# Patient Record
Sex: Female | Born: 1945 | Race: White | Hispanic: No | Smoking: Former smoker
Health system: Southern US, Community
[De-identification: ages and names within clinical notes are randomized; demographics above are authoritative.]

## PROBLEM LIST (undated history)

## (undated) DIAGNOSIS — T7840XA Allergy, unspecified, initial encounter: Secondary | ICD-10-CM

## (undated) DIAGNOSIS — M549 Dorsalgia, unspecified: Secondary | ICD-10-CM

## (undated) DIAGNOSIS — K219 Gastro-esophageal reflux disease without esophagitis: Secondary | ICD-10-CM

## (undated) DIAGNOSIS — I5031 Acute diastolic (congestive) heart failure: Secondary | ICD-10-CM

## (undated) DIAGNOSIS — I4891 Unspecified atrial fibrillation: Secondary | ICD-10-CM

## (undated) DIAGNOSIS — J8410 Pulmonary fibrosis, unspecified: Secondary | ICD-10-CM

## (undated) DIAGNOSIS — R32 Unspecified urinary incontinence: Secondary | ICD-10-CM

## (undated) DIAGNOSIS — J449 Chronic obstructive pulmonary disease, unspecified: Secondary | ICD-10-CM

## (undated) DIAGNOSIS — I7782 Antineutrophilic cytoplasmic antibody (ANCA) vasculitis: Secondary | ICD-10-CM

## (undated) DIAGNOSIS — E119 Type 2 diabetes mellitus without complications: Secondary | ICD-10-CM

## (undated) DIAGNOSIS — M1990 Unspecified osteoarthritis, unspecified site: Secondary | ICD-10-CM

## (undated) DIAGNOSIS — J9621 Acute and chronic respiratory failure with hypoxia: Secondary | ICD-10-CM

## (undated) DIAGNOSIS — B029 Zoster without complications: Secondary | ICD-10-CM

## (undated) DIAGNOSIS — R0489 Hemorrhage from other sites in respiratory passages: Secondary | ICD-10-CM

## (undated) DIAGNOSIS — A419 Sepsis, unspecified organism: Secondary | ICD-10-CM

## (undated) DIAGNOSIS — R042 Hemoptysis: Secondary | ICD-10-CM

## (undated) DIAGNOSIS — R51 Headache: Secondary | ICD-10-CM

## (undated) DIAGNOSIS — E041 Nontoxic single thyroid nodule: Secondary | ICD-10-CM

## (undated) DIAGNOSIS — R6520 Severe sepsis without septic shock: Secondary | ICD-10-CM

## (undated) DIAGNOSIS — I10 Essential (primary) hypertension: Secondary | ICD-10-CM

## (undated) DIAGNOSIS — Z87440 Personal history of urinary (tract) infections: Secondary | ICD-10-CM

## (undated) DIAGNOSIS — I2721 Secondary pulmonary arterial hypertension: Secondary | ICD-10-CM

## (undated) DIAGNOSIS — G4730 Sleep apnea, unspecified: Secondary | ICD-10-CM

## (undated) DIAGNOSIS — E669 Obesity, unspecified: Secondary | ICD-10-CM

## (undated) DIAGNOSIS — C801 Malignant (primary) neoplasm, unspecified: Secondary | ICD-10-CM

## (undated) DIAGNOSIS — I517 Cardiomegaly: Secondary | ICD-10-CM

## (undated) DIAGNOSIS — I776 Arteritis, unspecified: Secondary | ICD-10-CM

## (undated) DIAGNOSIS — J45909 Unspecified asthma, uncomplicated: Secondary | ICD-10-CM

## (undated) DIAGNOSIS — I480 Paroxysmal atrial fibrillation: Secondary | ICD-10-CM

## (undated) HISTORY — DX: Dorsalgia, unspecified: M54.9

## (undated) HISTORY — DX: Acute and chronic respiratory failure with hypoxia: J96.21

## (undated) HISTORY — DX: Type 2 diabetes mellitus without complications: E11.9

## (undated) HISTORY — DX: Headache: R51

## (undated) HISTORY — DX: Sepsis, unspecified organism: R65.20

## (undated) HISTORY — DX: Unspecified osteoarthritis, unspecified site: M19.90

## (undated) HISTORY — DX: Unspecified asthma, uncomplicated: J45.909

## (undated) HISTORY — DX: Paroxysmal atrial fibrillation: I48.0

## (undated) HISTORY — DX: Cardiomegaly: I51.7

## (undated) HISTORY — DX: Secondary pulmonary arterial hypertension: I27.21

## (undated) HISTORY — DX: Hemoptysis: R04.2

## (undated) HISTORY — DX: Hemorrhage from other sites in respiratory passages: R04.89

## (undated) HISTORY — DX: Unspecified atrial fibrillation: I48.91

## (undated) HISTORY — PX: OOPHORECTOMY: SHX86

## (undated) HISTORY — DX: Sepsis, unspecified organism: A41.9

## (undated) HISTORY — DX: Obesity, unspecified: E66.9

## (undated) HISTORY — DX: Acute diastolic (congestive) heart failure: I50.31

## (undated) HISTORY — DX: Nontoxic single thyroid nodule: E04.1

## (undated) HISTORY — PX: ABLATION: SHX5711

## (undated) HISTORY — DX: Zoster without complications: B02.9

## (undated) HISTORY — DX: Antineutrophilic cytoplasmic antibody (ANCA) vasculitis: I77.82

## (undated) HISTORY — PX: CHOLECYSTECTOMY: SHX55

## (undated) HISTORY — DX: Gastro-esophageal reflux disease without esophagitis: K21.9

## (undated) HISTORY — DX: Pulmonary fibrosis, unspecified: J84.10

## (undated) HISTORY — DX: Unspecified urinary incontinence: R32

## (undated) HISTORY — DX: Arteritis, unspecified: I77.6

## (undated) HISTORY — DX: Allergy, unspecified, initial encounter: T78.40XA

## (undated) HISTORY — DX: Personal history of urinary (tract) infections: Z87.440

## (undated) HISTORY — DX: Essential (primary) hypertension: I10

## (undated) HISTORY — DX: Sleep apnea, unspecified: G47.30

---

## 1977-12-23 HISTORY — PX: ABDOMINAL HYSTERECTOMY: SHX81

## 2004-11-13 ENCOUNTER — Ambulatory Visit: Payer: Self-pay | Admitting: Internal Medicine

## 2006-01-06 ENCOUNTER — Ambulatory Visit: Payer: Self-pay | Admitting: Internal Medicine

## 2007-04-16 ENCOUNTER — Ambulatory Visit: Payer: Self-pay | Admitting: Internal Medicine

## 2007-06-16 ENCOUNTER — Ambulatory Visit: Payer: Self-pay | Admitting: Urology

## 2009-02-17 ENCOUNTER — Emergency Department: Payer: Self-pay | Admitting: Emergency Medicine

## 2009-03-13 ENCOUNTER — Ambulatory Visit: Payer: Self-pay | Admitting: Internal Medicine

## 2009-03-14 ENCOUNTER — Ambulatory Visit: Payer: Self-pay | Admitting: Unknown Physician Specialty

## 2009-03-28 ENCOUNTER — Ambulatory Visit: Payer: Self-pay | Admitting: Internal Medicine

## 2009-03-28 ENCOUNTER — Encounter: Payer: Self-pay | Admitting: Internal Medicine

## 2009-03-28 DIAGNOSIS — I43 Cardiomyopathy in diseases classified elsewhere: Secondary | ICD-10-CM

## 2009-03-28 DIAGNOSIS — I110 Hypertensive heart disease with heart failure: Secondary | ICD-10-CM | POA: Insufficient documentation

## 2009-03-28 DIAGNOSIS — I480 Paroxysmal atrial fibrillation: Secondary | ICD-10-CM | POA: Insufficient documentation

## 2009-03-28 DIAGNOSIS — I517 Cardiomegaly: Secondary | ICD-10-CM | POA: Insufficient documentation

## 2009-03-28 HISTORY — DX: Hypertensive heart disease with heart failure: I11.0

## 2009-03-28 HISTORY — DX: Cardiomegaly: I51.7

## 2009-03-30 ENCOUNTER — Ambulatory Visit: Payer: Self-pay | Admitting: Cardiology

## 2009-04-03 ENCOUNTER — Ambulatory Visit: Payer: Self-pay | Admitting: Internal Medicine

## 2009-04-10 ENCOUNTER — Ambulatory Visit: Payer: Self-pay | Admitting: Internal Medicine

## 2009-04-10 ENCOUNTER — Telehealth: Payer: Self-pay | Admitting: Adult Health

## 2009-04-11 ENCOUNTER — Ambulatory Visit: Payer: Self-pay | Admitting: Cardiology

## 2009-04-11 ENCOUNTER — Inpatient Hospital Stay (HOSPITAL_COMMUNITY): Admission: EM | Admit: 2009-04-11 | Discharge: 2009-04-20 | Payer: Self-pay | Admitting: Emergency Medicine

## 2009-04-11 ENCOUNTER — Ambulatory Visit: Payer: Self-pay | Admitting: Pulmonary Disease

## 2009-04-13 ENCOUNTER — Encounter: Payer: Self-pay | Admitting: Cardiology

## 2009-04-13 DIAGNOSIS — K219 Gastro-esophageal reflux disease without esophagitis: Secondary | ICD-10-CM

## 2009-04-13 DIAGNOSIS — E041 Nontoxic single thyroid nodule: Secondary | ICD-10-CM

## 2009-04-13 DIAGNOSIS — R519 Headache, unspecified: Secondary | ICD-10-CM | POA: Insufficient documentation

## 2009-04-13 DIAGNOSIS — M549 Dorsalgia, unspecified: Secondary | ICD-10-CM | POA: Insufficient documentation

## 2009-04-13 DIAGNOSIS — F330 Major depressive disorder, recurrent, mild: Secondary | ICD-10-CM | POA: Insufficient documentation

## 2009-04-13 DIAGNOSIS — M1990 Unspecified osteoarthritis, unspecified site: Secondary | ICD-10-CM | POA: Insufficient documentation

## 2009-04-13 DIAGNOSIS — R51 Headache: Secondary | ICD-10-CM

## 2009-04-13 DIAGNOSIS — F329 Major depressive disorder, single episode, unspecified: Secondary | ICD-10-CM

## 2009-04-13 DIAGNOSIS — I776 Arteritis, unspecified: Secondary | ICD-10-CM

## 2009-04-20 ENCOUNTER — Encounter: Payer: Self-pay | Admitting: Cardiology

## 2009-08-07 ENCOUNTER — Encounter: Payer: Self-pay | Admitting: *Deleted

## 2010-01-08 ENCOUNTER — Encounter: Payer: Self-pay | Admitting: Geriatric Medicine

## 2010-01-23 ENCOUNTER — Encounter: Payer: Self-pay | Admitting: Geriatric Medicine

## 2010-02-07 ENCOUNTER — Telehealth (INDEPENDENT_AMBULATORY_CARE_PROVIDER_SITE_OTHER): Payer: Self-pay | Admitting: *Deleted

## 2010-02-20 ENCOUNTER — Encounter: Payer: Self-pay | Admitting: Geriatric Medicine

## 2010-03-23 ENCOUNTER — Encounter: Payer: Self-pay | Admitting: Geriatric Medicine

## 2010-04-22 ENCOUNTER — Encounter: Payer: Self-pay | Admitting: Geriatric Medicine

## 2010-05-23 ENCOUNTER — Encounter: Payer: Self-pay | Admitting: Geriatric Medicine

## 2010-12-23 HISTORY — PX: OTHER SURGICAL HISTORY: SHX169

## 2011-01-22 NOTE — Progress Notes (Signed)
Summary: Records request from [facility]  Request for records received from [facility]. Request forwarded to [facility]. Valda Favia  February 07, 2010 3:44 PM

## 2011-04-03 LAB — COMPREHENSIVE METABOLIC PANEL
ALT: 46 U/L — ABNORMAL HIGH (ref 0–35)
AST: 54 U/L — ABNORMAL HIGH (ref 0–37)
Albumin: 3.1 g/dL — ABNORMAL LOW (ref 3.5–5.2)
Alkaline Phosphatase: 76 U/L (ref 39–117)
BUN: 21 mg/dL (ref 6–23)
CO2: 26 mEq/L (ref 19–32)
Calcium: 8.6 mg/dL (ref 8.4–10.5)
Chloride: 99 mEq/L (ref 96–112)
GFR calc Af Amer: >60 mL/min (ref >60)
Glucose, Bld: 198 mg/dL — ABNORMAL HIGH (ref 70–99)
Potassium: 4 mEq/L (ref 3.5–5.1)
Sodium: 136 mEq/L (ref 135–145)
Total Bilirubin: 1.2 mg/dL (ref 0.3–1.2)
Total Protein: 6.3 g/dL (ref 6.0–8.3)

## 2011-04-03 LAB — CBC
Hemoglobin: 10.2 g/dL — ABNORMAL LOW (ref 12.0–15.0)
Hemoglobin: 11.1 g/dL — ABNORMAL LOW (ref 12.0–15.0)
MCHC: 33.2 g/dL (ref 30.0–36.0)
MCHC: 33.9 g/dL (ref 30.0–36.0)
MCHC: 34.1 g/dL (ref 30.0–36.0)
MCV: 85 fL (ref 78.0–100.0)
Platelets: 292 10*3/uL (ref 150–400)
Platelets: 408 10*3/uL — ABNORMAL HIGH (ref 150–400)
RBC: 3.53 MIL/uL — ABNORMAL LOW (ref 3.87–5.11)
RBC: 3.66 MIL/uL — ABNORMAL LOW (ref 3.87–5.11)
RDW: 14.3 % (ref 11.5–15.5)
RDW: 15 % (ref 11.5–15.5)
RDW: 15 % (ref 11.5–15.5)

## 2011-04-03 LAB — PROTIME-INR
INR: 3 — ABNORMAL HIGH (ref 0.00–1.49)
INR: 3.3 — ABNORMAL HIGH (ref 0.00–1.49)
INR: 3.7 — ABNORMAL HIGH (ref 0.00–1.49)
Prothrombin Time: 22.4 seconds — ABNORMAL HIGH (ref 11.6–15.2)
Prothrombin Time: 28.5 seconds — ABNORMAL HIGH (ref 11.6–15.2)
Prothrombin Time: 33 seconds — ABNORMAL HIGH (ref 11.6–15.2)

## 2011-04-03 LAB — DIFFERENTIAL
Basophils Absolute: 0 10*3/uL (ref 0.0–0.1)
Basophils Absolute: 0 10*3/uL (ref 0.0–0.1)
Basophils Absolute: 0.1 10*3/uL (ref 0.0–0.1)
Basophils Relative: 0 % (ref 0–1)
Basophils Relative: 1 % (ref 0–1)
Eosinophils Relative: 1 % (ref 0–5)
Lymphocytes Relative: 14 % (ref 12–46)
Monocytes Absolute: 1 10*3/uL (ref 0.1–1.0)
Neutro Abs: 10.1 10*3/uL — ABNORMAL HIGH (ref 1.7–7.7)
Neutro Abs: 10.2 10*3/uL — ABNORMAL HIGH (ref 1.7–7.7)
Neutro Abs: 16 10*3/uL — ABNORMAL HIGH (ref 1.7–7.7)
Neutrophils Relative %: 89 % — ABNORMAL HIGH (ref 43–77)

## 2011-04-03 LAB — BLOOD GAS, ARTERIAL
Drawn by: 309681
O2 Content: 6 L/min
O2 Saturation: 77.3 %
Patient temperature: 98.6

## 2011-04-03 LAB — ANA
Anti Nuclear Antibody(ANA): NEGATIVE
Anti Nuclear Antibody(ANA): NEGATIVE

## 2011-04-03 LAB — BASIC METABOLIC PANEL
BUN: 16 mg/dL (ref 6–23)
BUN: 18 mg/dL (ref 6–23)
BUN: 26 mg/dL — ABNORMAL HIGH (ref 6–23)
CO2: 22 mEq/L (ref 19–32)
CO2: 29 mEq/L (ref 19–32)
Calcium: 8.2 mg/dL — ABNORMAL LOW (ref 8.4–10.5)
Calcium: 8.4 mg/dL (ref 8.4–10.5)
Calcium: 8.6 mg/dL (ref 8.4–10.5)
Chloride: 102 mEq/L (ref 96–112)
Chloride: 96 mEq/L (ref 96–112)
Creatinine, Ser: 0.68 mg/dL (ref 0.4–1.2)
Creatinine, Ser: 0.7 mg/dL (ref 0.4–1.2)
Creatinine, Ser: 0.78 mg/dL (ref 0.4–1.2)
GFR calc Af Amer: >60 mL/min (ref >60)
GFR calc Af Amer: >60 mL/min (ref >60)
GFR calc non Af Amer: >60 mL/min (ref >60)
GFR calc non Af Amer: >60 mL/min (ref >60)
GFR calc non Af Amer: >60 mL/min (ref >60)
Glucose, Bld: 120 mg/dL — ABNORMAL HIGH (ref 70–99)
Glucose, Bld: 139 mg/dL — ABNORMAL HIGH (ref 70–99)
Glucose, Bld: 162 mg/dL — ABNORMAL HIGH (ref 70–99)
Glucose, Bld: 209 mg/dL — ABNORMAL HIGH (ref 70–99)
Potassium: 4.1 mEq/L (ref 3.5–5.1)
Sodium: 135 mEq/L (ref 135–145)

## 2011-04-03 LAB — URINE MICROSCOPIC-ADD ON

## 2011-04-03 LAB — GLUCOSE, CAPILLARY
Glucose-Capillary: 109 mg/dL — ABNORMAL HIGH (ref 70–99)
Glucose-Capillary: 133 mg/dL — ABNORMAL HIGH (ref 70–99)
Glucose-Capillary: 142 mg/dL — ABNORMAL HIGH (ref 70–99)
Glucose-Capillary: 179 mg/dL — ABNORMAL HIGH (ref 70–99)
Glucose-Capillary: 210 mg/dL — ABNORMAL HIGH (ref 70–99)
Glucose-Capillary: 224 mg/dL — ABNORMAL HIGH (ref 70–99)
Glucose-Capillary: 274 mg/dL — ABNORMAL HIGH (ref 70–99)
Glucose-Capillary: 289 mg/dL — ABNORMAL HIGH (ref 70–99)
Glucose-Capillary: 329 mg/dL — ABNORMAL HIGH (ref 70–99)
Glucose-Capillary: 336 mg/dL — ABNORMAL HIGH (ref 70–99)
Glucose-Capillary: 367 mg/dL — ABNORMAL HIGH (ref 70–99)
Glucose-Capillary: 384 mg/dL — ABNORMAL HIGH (ref 70–99)

## 2011-04-03 LAB — STREP PNEUMONIAE URINARY ANTIGEN: Strep Pneumo Urinary Antigen: NEGATIVE

## 2011-04-03 LAB — URINALYSIS, ROUTINE W REFLEX MICROSCOPIC
Ketones, ur: NEGATIVE mg/dL
Leukocytes, UA: NEGATIVE
Nitrite: NEGATIVE
Specific Gravity, Urine: 1.009 (ref 1.005–1.030)
pH: 6.5 (ref 5.0–8.0)

## 2011-04-03 LAB — CK TOTAL AND CKMB (NOT AT ARMC): CK, MB: 1.6 ng/mL (ref 0.3–4.0)

## 2011-04-03 LAB — LEGIONELLA ANTIGEN, URINE

## 2011-04-03 LAB — RHEUMATOID FACTOR: Rhuematoid fact SerPl-aCnc: <20 IU/mL (ref 0–20)

## 2011-04-03 LAB — SEDIMENTATION RATE: Sed Rate: 59 mm/hr — ABNORMAL HIGH (ref 0–22)

## 2011-04-03 LAB — HIGH SENSITIVITY CRP: CRP, High Sensitivity: 5.2 mg/L — ABNORMAL HIGH

## 2011-04-03 LAB — MAGNESIUM: Magnesium: 2.2 mg/dL (ref 1.5–2.5)

## 2011-04-03 LAB — ANGIOTENSIN CONVERTING ENZYME: Angiotensin-Converting Enzyme: 37 U/L (ref 9–67)

## 2011-04-03 LAB — TROPONIN I: Troponin I: <0.01 ng/mL (ref 0.00–0.06)

## 2011-05-07 NOTE — Letter (Signed)
April 03, 2009    Thompson Grayer, MD  [address] [place], [place] [address]   RE:  [name], FIRST  MRN:  [id number]  /  DOB:  04-01-1946   Dear Jeneen Rinks,   This letter is just a cover letter to introduce to you Betty [name], a 65-  year-old woman, with paroxysmal atrial fibrillation that is very  symptomatic.  She was recently TEE cardioverted at [facility] under Dr.  Claris Gladden assistance while on propafenone.  She had failed to tolerate  flecainide from a side effect profile point of view and has actually had  a significant headaches since the Rythmol was initiated.  She is very  interested in pursuing catheter ablation for her atrial fibrillation.  We have discussed potential benefits as well as the potential risks.  Her husband underwent pulmonary vein isolation by Dr. Lenard Galloway down at  [facility]: [facility], but he was not able to offer her any options for more than 6  months.  She had seen him in the context of her husband's appointment  and I had mentioned to her that we could help her here and she just  wanted to check with him prior to pursuing that.   Her current medications include:  1. Diltiazem 120.  2. Coumadin.  3. Rythmol 325 b.i.d.   On examination today, her blood pressure was 136/80, her pulse was 76,  her weight was 250 which is down about 10 pounds.  Her neck veins were  flat.  Lungs were clear.  Heart sounds were regular, and the abdomen was  soft.  The extremities were without edema.  There was an ecchymosis on  her belly.   Electrocardiogram dated today demonstrated sinus rhythm at 76 with  intervals of 0.15/0.09/0.46.  The axis was leftward at -61.   IMPRESSION:  1. Atrial fibrillation - paroxysmal holding sinus rhythm on      propafenone.  2. Headache, question related to the medications.  3. Normal left ventricular function.   Jeneen Rinks, I look forward to your thoughts on Betty [name] and whether you think  she would be appropriate candidate for pulmonary vein isolation,  having  failed one drug and not wanting to take the others long term.   Please let me know if there is anything further I can do to assist in  this.   To facilitate her care, I have asked that she get her INR checked in  [place] next Monday when she is working.  We will check her INR  today as well.  It will be left to check when she comes in to see you in  the office and this is done to try to minimize her missing work.  Thank  you for your help.    Sincerely,      Deboraha Sprang, MD, [facility]  Electronically Signed    SCK/MedQ  DD: 04/03/2009  DT: 04/04/2009  Job #: [id number]   CC:    Frazier Richards, MD

## 2011-05-07 NOTE — Assessment & Plan Note (Signed)
[place]                                 ON-CALL NOTE   SARIT, THAKUR                           MRN:          [id number]  DATE:03/24/2009                            DOB:          1946/04/22    ELECTROPHYSIOLOGIST:  Deboraha Sprang, MD, [facility]   I received a page through the answering service from Ms. Gau stating  she was experiencing fatigue with dizziness and not sleeping at night.  I called Ms. Quint back promptly.  After a lengthy conversation, it  turned out Ms. Lasher has recently been started on flecainide and Toprol.  She is experiencing nausea when she takes the flecainide.  She is also  experiencing some visual hallucinations.  She states she is not sleeping  at night since starting the medicines.  She feels tired all the time.  Apparently is pending a sleep study this Saturday night.  She states,  however, she has only been sleeping one half to two hours sporadically  through the night since starting the medications.  She is somewhat  frustrated, does not want to carry out the sleep study as she does not  feel that it would benefit her at this time until she determines what is  causing her insomnia and possible side effects from the medicine.  I  reassured Ms. Romig that we would reschedule her sleep study as it is  causing her much stress at this time.  Reviewed the medications with  her.  She is taking the doses as prescribed.  At this time, I have  decided to hold the flecainide.  We will continue the Toprol-XL at 25 mg  in the evening.  She states when she checks her pulse it stays at around  50 and she is complaining of ongoing fatigue and daytime sleepiness.  I  reassured her that we would follow up with Dr. Caryl Comes on Monday and get  her back in the office for further evaluation.  She is agreeable to hold  the flecainide for now and continue the Toprol.  She will call the Home  company and have the sleep study put on hold, and I will  get in touch  with Dr. Olin Pia nurse to have them call her Monday for further  instructions.      Rosanne Sack, ACNP  Electronically Signed      Carlena Bjornstad, MD, [facility]  Electronically Signed   MB/MedQ  DD: 03/24/2009  DT: 03/24/2009  Job #: [id number]

## 2011-05-07 NOTE — H&P (Signed)
NAME:  Betty [name], Betty [name] NO.:  [id number]   MEDICAL RECORD NO.:  [id number]          PATIENT TYPE:  INP   LOCATION:  [id number]                         FACILITY:  [facility]   PHYSICIAN:  Minus Breeding, MD, FACCDATE OF BIRTH:  04-21-1946   DATE OF ADMISSION:  04/11/2009  DATE OF DISCHARGE:                              HISTORY & PHYSICAL   PRIMARY CARDIOLOGIST:  Deboraha Sprang, MD, [facility]   PRIMARY CARE Deyon Chizek:  Dr. Frazier Richards, [place].   PATIENT PROFILE:  A 65 year old Caucasian female with prior history of  paroxysmal atrial fibrillation who was just cardioverted yesterday who  presents with persistent dyspnea, orthopnea, and profound fatigue.   PROBLEMS:  1. Paroxysmal atrial fibrillation.      a.     Previously on flecainide, this was ineffective.      b.     Previously on propafenone, however, this was ineffective and       caused headaches.      c.     Status post TEE and cardioversion on March 30, 2009 with       successful reversion to sinus rhythm.      d.     Status post repeat cardioversion on April 10, 2009 with       restoration of sinus rhythm.  2. History of normal LV function.  3. History of normal coronary artery, chronic diastolic heart failure      worse so when she in AFib.  4. Questionable history of sleep apnea.  5. GERD.  6. Obesity.  7. History of thyroid nodules.  8. Osteoarthritis.  9. History of shingles.  10.Chronic back pain.  11.History of headaches.   HISTORY OF PRESENT ILLNESS:  A 65 year old Caucasian female with history  of paroxysmal atrial fibrillation who has failed flecainide and  propafenone therapy in the past.  She is recently status post TEE and  cardioversion on April 8th with repeat cardioversion yesterday after she  presented to our [place] office with complaints of severe dyspnea  with wheezing and tachy palpitations and was found to be in atrial  fibrillation.  She was seen by Dr. Lovena Le and referred to  [facility] where  she underwent successful cardioversion.  Following cardioversion, she  unfortunately continued to feel fatigued and exhausted as well as  short of breath with minimal activity.  She also found herself to be  orthopneic requiring 2 pillows rather than her usual 1.  She lied around  in bed all day and started amiodarone 400 mg b.i.d., which was initiated  yesterday.  During the evening last night, she noted recurrent wheezing  and she slept fairly poorly.  She said at one point, she felt her heart  beating in her throat, which she associates with atrial fibrillation.  This morning when she continued to feel short of breath and fatigue, she  thought she was probably in AFib and called our office.  She was advised  to present to the [facility] ED.  Here she is in sinus rhythm by 12-  lead, although, she remains symptomatic and had  somewhat labored speech.  Chest x-ray here has suggested pulmonary edema.   ALLERGIES:  No known drug allergies, although flecainide and propafenone  have been ineffective and she did have headaches with propafenone and a  side effect to flecainide that she does not recall.   HOME MEDICATIONS:  1. Amiodarone 400 mg b.i.d.  2. Diltiazem CD 120 mg daily.  3. Coumadin as directed.   FAMILY HISTORY:  Mother is alive at age 74 and is currently hospitalized  with dyspnea.  She apparently has no history of coronary artery disease,  stroke, or diabetes.  Father died at 7 with history of MI, CHF, and  CVA.  She has a brother who had bladder cancer, and also had history of  CAD and PVD.  She has a sister who is alive and well.   SOCIAL HISTORY:  She lives in [place] with her husband.  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  She has a 20-pack-year history of tobacco abuse, quitting in 1970.  She  denies alcohol use.  She does not use drugs.  She does not routinely  exercise.   REVIEW OF SYSTEMS:   Positive for profound fatigue as well as dyspnea on  exertion and 2-pillow orthopnea over the past 24 hours.  She just feel  very weak and washed out.  She has had a cough, which for the most part  has been nonproductive and this has been going on for the past 2 days.  Otherwise, all systems reviewed and negative.  She is a full code.   PHYSICAL EXAMINATION:  VITAL SIGNS:  Temperature 97.9, heart rate 79,  respirations 22, blood pressure 111/50, pulse ox is 90% on room air.  GENERAL:  A pleasant white female awake, alert, and oriented x3.  She  has somewhat labored speech.  She has a normal affect.  HEENT:  Normal.  NEURO:  Grossly intact.  Nonfocal.  SKIN:  Warm and dry without lesions or masses.  MUSCULOSKELETAL:  Grossly normal without deformity or effusions.  NECK:  Supple with mildly elevated JVP.  No bruits.  LUNGS:  Respirations are regular and labored with few crackles at  bilateral bases, left greater than right.  CARDIAC:  Regular S1 and S2.  No S3 or S4, murmurs.  ABDOMEN:  Round and soft.  EXTREMITIES:  No clubbing, cyanosis, or edema.  Dorsalis pedis,  posterior tibial pulses are 2+ and equal bilaterally.   Chest x-ray shows pulmonary edema.  EKG shows sinus rhythm at rate of  80, left axis, no acute SVT changes.   LABORATORY WORK:  Hemoglobin 11.1, hematocrit 33.5, WBC 13.2, and  platelets 314.   ASSESSMENT AND PLAN:  1. Dyspnea/fatigue.  Discharged yesterday morning with shortness of      breath and wheezing and she was found to be in atrial fibrillation      and subsequently underwent cardioversion.  She unfortunately has      continued to have symptoms and there has been some question of      recurrent of paroxysmal atrial fibrillation, although she is      currently in sinus rhythm, though, her symptoms persist.  Chest x-      ray has suggested pulmonary edema and we will plan to admit her and      diurese her.  I will check her BNP.  As she does exhibit       leukocytosis with recent  cough and questionable infiltrate on ECG,      if her BNP is normal, we will plan to start Avelox.  2. Acute on chronic diastolic congestive heart failure.  See #1.  We      will follow up her chest x-ray in the a.m. as well as her BNP.  We      will plan to diurese her today.  Heart rate and blood pressure      currently stable.  We will check an echo today.  3. Paroxysmal atrial fibrillation, status post cardioversion on April 10, 2009.  Amiodarone was just started yesterday at 400 mg b.i.d.      She says her INR was 4.7 yesterday.  We will continue Coumadin for      thrombus.  4. Leukocytosis.  Question etiology as above.  The chest x-ray is      abnormal per Radiology most suggestive of edema, although we      questioned whether or not she may have developed community-acquired      pneumonia.  Considering antibiotics, if BNP is low.  I will also      check UA.      Murray Hodgkins, ANP      Minus Breeding, MD, [facility]  Electronically Signed    CB/MEDQ  D:  04/11/2009  T:  04/12/2009  Job:  [id number]

## 2011-05-07 NOTE — Letter (Signed)
March 13, 2009    Dr. Frazier Richards  [place], [place]   RE:  Betty [name], Betty [name]  MRN:  [id number]  /  DOB:  January 21, 1946   Dear Betty [name],   It is a pleasure seeing Betty [name] at your request today regarding her  atrial fibrillation.   As you know she is a 65 year old woman who has a history of atrial  fibrillation that dates back about a year and half.  These episodes are  abrupt in onset and offset, they can last hours and they are associated  with significant fatigue, mild shortness of breath, discombobulating  palpitations.  They occur mostly in the afternoon, they have been  increasing in frequency.   She has been treated with diltiazem initially at 120 and then  subsequently up titrated to 180 over the last couple of months;  concurrent with this has been the development of some peripheral edema.   Her cardiac evaluation to date has included a catheterization that was  done at [facility], the results of which she does not know but she presumes it  was normal.  She had the ultrasound done in your office that  demonstrated mild LVH, moderate mitral regurgitation, this being noted  in the setting of atrial fibrillation.  She then had a stress echo which  raised the possibility of septal wall ischemia.  I presume the  catheterization was done after this.   Her thromboembolic risk factors are notable for hypertension, gender and  borderline age.  She does not have diabetes or prior stroke or heart  failure.   Her cardiac risk factors are notable as above, I do not know her lipid  status.   She has sleep disorder breathing according to what she says her husband  reports.  She has some daytime somnolence and certainly has a.m.  fatigue.  I do not know her Epworth's score.   FAMILY HISTORY:  Notable for coronary disease as well as atrial  fibrillation.   PAST MEDICAL HISTORY:  1. Thyroid nodules.  2. Arthritis.  3. GE reflux disease.  4.  Allergies.  5. History of bladder [name].  6. Shingles.  7. Back pain.  8. Headaches.   PAST SURGICAL HISTORY:  Notable for hysterectomy with unilateral  oophorectomy and cholecystectomy.   SOCIAL HISTORY:  She is married, she has one child.  She does not use  cigarettes, alcohol or recreational drugs.  She is not fit.  She knows  that she is obese, is trying to figure out ways to diet.   MEDICATIONS:  1. Diltiazem 180 mg a day.  2. Aspirin intermittently.   SHE IS ALLERGIC TO NEURONTIN.   EXAMINATION:  She is an obese Caucasian female appearing her stated age  of 12.  Her blood pressure is 152/60 with a pulse of 83, her weight was  266.  HEENT:  No icterus or xanthoma, the neck veins were flat, the carotids  are brisk and full bilaterally without bruits.  BACK:  Without kyphosis or scoliosis.  LUNGS:  Clear.  HEART:  Sounds were regular without murmurs or gallops.  ABDOMEN:  Protuberant but soft.  Femoral pulses were trace, distal pulses were intact.  There was no  clubbing or cyanosis.  There was 1+ peripheral edema on the left.  Her  foot was not examined, she did describe some left-sided foot pain that  has been there for a couple of weeks.   Electrocardiogram dated today demonstrated  sinus rhythm at 67 with  intervals 0.15/0.08/0.46 with a QTC of 0.49, the axis was leftward at  minus 50 degrees.  Electrocardiogram from June 9 is consistent with  atrial fibrillation with a rapid ventricular response.   IMPRESSION:  1. Paroxysmal atrial fibrillation - symptomatic associated with a      rapid ventricular response.  2. Thromboembolic risk factors notable for:      a.     Hypertension.      b.     Gender.      c.     Borderline age.  3. Sleep disorder breathing, possible obstructive sleep apnea.  4. Fatigue/edema, question related to diltiazem.  5. Obesity.   DISCUSSION:  Betty [name], Betty [name] has paroxysmal atrial fibrillation which  is quite discombobulating in terms  of symptoms.  She has not tolerated  Toprol as a rate controlling agent and I am not sure she is tolerating  the diltiazem now either.  The first step is obviously rate control and  I have given her prescriptions to 3 different beta-blockers including  metoprolol tartrate 50 b.i.d., atenolol 50 and nadolol 40.  We will see  if she can tolerate any of these from a side effect profile and then we  can increase it as we need to for rate control.   We also discussed the potential use of antiarrhythmic drugs and she  would like to pursue this and I think given the frequency and the  severity of her symptoms and her young age it is reasonable thing to do.  Once we have confirmation from her catheterization which we will obtain  from [facility] that there is no coronary disease that would choose a  1C antiarrhythmic drug and probably would use flecainide.  We would then  follow her along to see how she did symptom-wise.  She asked about  pulmonary vein isolation.  This would be reasonable thing to consider;  however, guidelines would suggest that she should be on antiarrhythmic  drug and fail these prior to pursuing that course.   Her sleep disorder breathing may reflect obstructive sleep apnea and  this may be contributing to her hypertension and to her atrial  fibrillation.  She should probably have a sleep study which could either  be done in a regular lab or potentially with this new night watch  monitor.   I look forward to planning to sit down with her in about 6 - 8 weeks to  see how she is doing, A, on the beta blockers, B, on the flecainide.   If there is anything I can do further please do not hesitate to contact  me.    Sincerely,      Betty Sprang, MD, [facility]  Electronically Signed    SCK/MedQ  DD: 03/13/2009  DT: 03/13/2009  Job #: [id number]

## 2011-05-07 NOTE — Consult Note (Signed)
NAMEJAYMIE, Betty [name] NO.:  [id number]   MEDICAL RECORD NO.:  [id number]          PATIENT TYPE:  INP   LOCATION:  2921                         FACILITY:  [facility]   PHYSICIAN:  Providence Lanius, MD  DATE OF BIRTH:  Nov 07, 1946   DATE OF CONSULTATION:  DATE OF DISCHARGE:                                 CONSULTATION   IDENTIFICATION:  The patient is a 65 year old female with past medical  history significant for atrial fibrillation.  She has been on multiple  antiarrhythmics for it, however, recently was placed in amiodarone for  approximately 6-7 days prior to presentation and started complaining of  shortness of breath, but is not progressive in nature.  She denied any  cough, fever, chills, nausea, vomiting, abdominal pain, chest pain,  recent travel, or recent exposure.  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  She also did report some fatigue and some orthopnea, but no  dyspnea on exertion.   PAST MEDICAL HISTORY:  Significant for paroxysmal atrial fibrillation,  addressed by the patient's primary service, history of normal left  ventricular function, history of coronary artery disease, question of  sleep apnea, GERD, obesity, thyroid nodule, osteoarthritis, history of  shingles, chronic back pain, and history of chronic headaches.   Her medications at home are amiodarone 400 mg p.o. b.i.d., diltiazem CD  120 mg daily, and Coumadin.   ALLERGIES:  No known drug allergies.   FAMILY HISTORY:  Noncontributory.   SOCIAL HISTORY:  She lives in [place] with her husband, works at [employer].  She has a history of 20-pack-year  smoking history, quitting in the 1970s.  Denies any alcohol or drug use.   A 12-point review of systems negative other than mentioned above.   PHYSICAL EXAMINATION:  GENERAL:  This is a well-appearing female,  resting comfortably on the  exam bed, in no acute distress.  VITAL SIGNS:  She is afebrile, heart rate 79, respiratory rate is 18,  saturation 91% on 5 L nasal cannula, and blood pressure of 130/60.  HEENT:  Normocephalic and atraumatic.  Pupils are equal, round, and  reactive to light.  Extraocular movements are intact.  Oral and nasal  mucosa within normal limits.  NECK:  No thyromegaly or lymphadenopathy.  No jugular reflux  appreciated.  HEART:  Regular rate and rhythm.  Normal S1 and S2.  No murmurs, rubs,  or gallops appreciated.  LUNGS:  With diffuse rales audible in all lung fields.  ABDOMEN:  Obese, soft, nontender, and nondistended.  Positive bowel  sounds.  EXTREMITIES:  No edema.  No tenderness appreciated.  NEUROLOGIC:  Grossly intact.   LABORATORY STUDIES:  All reviewed, significant for an ABG on April 13, 2009, pH 7.43, a CO2 of 40, pO2 of 45, bicarb of 27 on the 6 L nasal  cannula.  Hemoglobin is 10.7, hematocrit 31.5, white blood cell count of  22.9, and platelet count of 408.  INR of 2.7, sodium is  136, potassium  4.3, chloride is 100, bicarb of 26, glucose is 335, BUN is 28,  creatinine 0.83, calcium of 8.6, AST is 54, ALT is 46, total bili is  1.4, and total protein 6.3.  Chest x-ray, I reviewed myself that showed  diffuse airspace disease, was read as pulmonary edema.  Chest CT done on  April 11, 2009, showed multifocal airspace disease with pulmonary  infiltrate and evidence of interstitial infiltrate.   ASSESSMENT AND PLAN:  The patient is a 65 year old female with past  medical history significant for atrial fibrillation that was started on  amiodarone approximately 1 week ago, started having significant dyspnea  shortly thereafter.  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  We will send an  ESR, CRP, ANA, ACE level, and rheumatoid factor to evaluate if there is  any other presence with regards to elevated white blood cell count,  mostly due to stress response as well as steroids.  However, I would  finish the course of Avelox for a total of 8 days.      Providence Lanius, MD  Electronically Signed     WJY/MEDQ  D:  04/17/2009  T:  04/18/2009  Job:  [id number]

## 2011-05-07 NOTE — Progress Notes (Signed)
[facility] OFFICE NOTE   SHAKEDA, KEINATH                           MRN:          [id number]  DATE:04/10/2009                            DOB:          03-Aug-1946    Ms. Morrell comes in today for an unscheduled visit.  She is a very  pleasant 65 year old woman who my partner Dr. Jolyn Nap has seen.  She  has a history of atrial fibrillation with a rapid ventricular response.  She has had symptoms for several months now.  The patient has had  difficulty maintaining sinus rhythm over the last several weeks.  She  initially tried flecainide which was ineffective, and she was recently  switched to propafenone at 325 twice a day.  Despite this, she underwent  DC cardioversion last Friday, restoring sinus rhythm.  Unfortunately she  has returned back to AFib and has diastolic heart failure symptoms  associated with them which are class III.  The patient is unable walk  from her car into the parking lot today because of severe dyspnea with  her AFib.  She gets neck fullness with this.  She has had no frank  anginal symptoms.  She has a history of a negative catheterization a  year ago.  Additional problems include obstructive sleep apnea.  The  patient states that when she walks, she gets short of breath and she  feels fullness in her neck.  She has had no frank syncope.  Her INR was  checked today.  It was 4.1.   CURRENT MEDICATIONS:  Diltiazem 120 a day, Coumadin as directed, Rythmol  325 twice a day.   PHYSICAL EXAMINATION:  She is a pleasant, obese, middle-aged woman in no  acute distress.  Blood pressure today was 126/60, the pulse was 140 and irregularly  irregular, respirations were 18.  NECK:  Revealed no jugular distention.  There was no thyromegaly.  Trachea was midline.  LUNGS:  Revealed rales in the bases bilaterally.  No wheezes or rhonchi  were present.  There was no increased work of  breathing.  CARDIOVASCULAR:  Exam revealed and irregularly irregular tachycardia.  Heart sounds were somewhat distant.  There is no S3 gallop appreciated.  There were no murmurs appreciated.  ABDOMEN:  Obese, nontender, nondistended.  There was no organomegaly.  EXTREMITIES:  Demonstrated no cyanosis, clubbing or edema.  Pulses were  2+ and symmetric.   IMPRESSION:  1. Very symptomatic paroxysmal atrial fibrillation with rapid      ventricular response and having failed two antiarrhythmic drugs.  2. Diastolic heart failure secondary to #1.   DISCUSSION:  I will attempt to cardiovert the patient either here in  [place] today or at [facility].  If we can get it scheduled in  [place], she is scheduled to see my partner Dr. Rayann Heman in several  days about AFib ablation if possible.  Will try to expedite this as  well.     Champ Mungo. Lovena Le, MD  Electronically Signed    GWT/MedQ  DD: 04/10/2009  DT: 04/10/2009  Job #: [id number]   cc:   [name]  Ouida Sills, M.D.  Deboraha Sprang, MD, [facility]

## 2011-05-07 NOTE — Assessment & Plan Note (Signed)
[place]                                 ON-CALL NOTE   [name], [name]                           MRN:          [id number]  DATE:03/14/2009                            DOB:          1946/06/30    I received a page on March 14, 2009, through the answering service from  [name].  She states she had seen Dr. Caryl Comes, on March 13, 2009, with  regard to her atrial fib and she was told to start flecainide 100 mg  p.o. b.i.d. at that time.  The patient states she went to pick up the  prescription today and was told there was no [facility] available at  [facility].  [facility] called [facility] [address] in [place], prescription for  flecainide 100 mg p.o. b.i.d., 62 tablets with 1 refill.  The patient  instructed to begin medicine as instructed by Dr. Caryl Comes and followup  with him as previously instructed.      Rosanne Sack, ACNP  Electronically Signed      Loralie Champagne, MD  Electronically Signed   MB/MedQ  DD: 03/14/2009  DT: 03/15/2009  Job #: [id number]

## 2011-05-07 NOTE — Discharge Summary (Signed)
[name], [name]                  ACCOUNT NO.:  [id number]   MEDICAL RECORD NO.:  [id number]          PATIENT TYPE:  INP   LOCATION:  2040                         FACILITY:  [facility]   PHYSICIAN:  Deboraha Sprang, MD, FACCDATE OF BIRTH:  Jul 25, 1946   DATE OF ADMISSION:  04/11/2009  DATE OF DISCHARGE:  04/20/2009                               DISCHARGE SUMMARY   ALLERGIES:  A breakthrough of atrial fibrillation on FLECAINIDE,  breakthrough of atrial  fibrillation on PROPAFENONE, and possible  pulmonary toxicity with AMIODARONE.   TIME FOR THIS DICTATION AND EXPLANATION TO PATIENT:  Greater than 45  minutes.   FINAL DIAGNOSIS:  1. Admitted with progressive dyspnea/tachy palpitations.      a.     The patient had symptomatic atrial fibrillation in the past       which exacerbates a class 3 chronic diastolic congestive heart       failure.      b.     Direct current cardioversion on April 10, 2009.      c.     The patient started amiodarone 400 mg twice daily on April       19th.  It was stopped on April 21st.      d.     The patient admitted on April 20th in sinus rhythm.  Her       chest x-ray showed bilateral extensive air space disease.  2. Pulmonary:  The patient refused biopsy or bronchoscopy offered by      pulmonary.      a.     A working diagnosis per pulmonary was amiodarone pulmonary       toxicity.      b.     The patient underwent an extensive 20-day prednisone therapy       and taper.      c.     The patient completed a course of Avelox for eight days this       admission.      d.     The patient had a course of Diflucan this admission.  3. The patient has been maintained on Coumadin therapy throughout this      hospitalization.  4. Maintaining sinus rhythm now on Cardizem 180 mg twice daily/digoxin      0.25 mg daily.  5. Echocardiogram on April 20, 2009, with an ejection fraction of 65%      to 70%, the presence of abnormal diastolic relaxation and mild      mitral  regurgitation.  6. Anxiety:  Seems to impact the patient's oxygen saturations with      ambulation.  The patient will go home with home oxygen.  She had      84% oxygen saturation with ambulation and 90% at rest.  7. Home health nursing and home health physical therapy at discharge.   SECONDARY DIAGNOSES:  1. Paroxysmal atrial fibrillation.      a.     Previously on flecainide, ineffective and had breakthrough.      b.     Previously on propafenone,  ineffective, with breakthrough       and caused headaches.      c.     Status post cardioversion in March 30, 2009, with brief       reversion to sinus rhythm.      d.     Repeat cardioversion on April 10, 2009, with restoration of       sinus rhythm.  Of note, she reports April 20th in sinus rhythm.  2. Normal left ventricular function.  3. Normal coronary anatomy with chronic diastolic congestive heart      failure which worsens when she is in atrial fibrillation.  4. Question of sleep apnea.  5. Gastroesophageal reflux disease.  6. Obesity.  7. History of thyroid nodules.  8. Osteoarthritis.  9. History of herpes zoster outbreak.  10.Chronic back pain.  11.History of headaches.   PROCEDURE:  The proposed biopsy and bronchoscopy have been deferred by  the patient.  The patient had several chest x-rays.  Chest x-ray on  presentation on April 20th, showed diffuse multifocal air space disease  involving all lobes.  The effect is more central and peripheral.  Differential considerations include multifocal pneumonia, versus  pulmonary edema.  Interstitial lung disease is felt less likely.  A  final chest x-ray on April 18, 2009, showed diffuse bilateral air space  disease again identified, slight improvement in the left upper lobe, no  effusion or pulmonary collapse.  Once again, the patient has undergone  an extensive prednisone taper.   BRIEF HISTORY:  Betty [name] is a 65 year old female.  She has a history of  paroxysmal atrial  fibrillation.  She has failed flecainide and  propafenone in the past.  She is status post transesophageal  echocardiogram and cardioversion on April 8th.  She required a repeat  cardioversion on April 19th, when she presented to the [place] office  of [place] with complaints of severe dyspnea,  wheezing and tachy palpitations.  She was found to be in atrial  fibrillation.   She was seen by Dr. Champ Mungo. Lovena Le and referred for a re-cardioversion  on April 19th.  Following cardioversion, the patient has felt fatigued  and exhausted and short of breath with minimal activity.  She is  requiring two pillows rather than her usual one pillow.  After her  cardioversion, she lay in bed all day.  She did start amiodarone 400 mg  twice daily.  This was given to her starting April 19th.   During the evening, she noted recurrent wheezing and felt fairly poorly.  She felt at one point that her heart was beating her throat.  She  associates this with atrial fibrillation.  She presents to [facility] short of breath and with fatigue; however, on admission she is  in sinus rhythm by a 12-lead electrocardiogram.  She is symptomatic with  somewhat labored speech.  A chest x-ray here suggests pulmonary edema;  however, the patient's, beta natriuretic peptide is 202.   HOSPITAL COURSE:  The patient has had a 10-day course here at [facility].  Since she had just started her amiodarone on April 19th, the  dose was continued after a conference with pulmonary on April 21st. A  full battery of laboratory studies were obtained, and these will be  discussed later in the laboratory section.  She was started on Avelox on  hospital day #1 of her admission.  She and IV Lasix diuresis over the  next 48  hours.  Solu-Medrol IV was started also on hospital day #1 and  she received Tamiflu 75 mg b.i.d. for a five-day course.  Initially the  patient did require higher acuity  care here at [facility], but  by hospital day #3 was able to transfer from critical care status to a  step-down bed and then rapidly to plain telemetry after 48 hours in step-  down.  The patient has made mild progress with her pulmonary status,  although at the time of discharge she would desaturate to 84% when  ambulating, with very poor rebound to oxygen saturation at 90% at rest.  This would qualify for home oxygen, and she will go home with this.  With regards to her rhythm, she maintained sinus rhythm for the first  five days and then relapsed into atrial fibrillation.  She was not  started on a beta-blocker and amiodarone was not restarted.  The patient  did have a digoxin load and continued also on diltiazem.  With these two  medications, the patient has maintained sinus rhythm for the remaining  five days, and will go home with these medications at discharge.   I must note that the patient and family are not happy with the care that  she has received here at [facility], ascribing her pulmonary  decompensation to amiodarone therapy, and indeed a final diagnosis may  be that the patient did have an acute pneumonitis in the setting of  amiodarone therapy.  Of note, the amiodarone was stopped as soon as it  was considered to be a player in her presentation here at [place]:  1. Prednisone 10 mg tablets, a taper which will extend over the next      18 days, and this has been outlined thoroughly in her discharge      sheet.  2. Digoxin 0.25 mg one tablet daily, a new medication.  3. Diltiazem 180 mg twice daily, a new dose.  4. Xopenex HFA inhaler,  two puffs every 4 hours as needed.  5. Coumadin 5 mg tablets, one tablet daily.  She follows up with Dr.      Tonette Bihari office on April 30th.  The family will make that      appointment.  6. Lorazepam 0.5 mg, one tablet every 8 hours as needed only.  7. Ambien 5 mg, one  tablet at bedtime, only as needed.  8. Home oxygen.   FOLLOWUP:  The family will make an appointment for Ms. Stammen's follow-up  with the husband's cardiologist.  There is no need for her to follow up  with Dr. Thompson Grayer, who had been scheduled to see her with regard to  atrial fibrillation ablation.  They have declined to follow-up with the  [facility] as well.   LABORATORY STUDIES:  Of note, on this admission her admission BNP on  April 20th was 202.  At that time her erythrocyte sedimentation rate was  59.  The range is 0 to 22.  Rheumatoid factor was 27.  The range is 0 to  20.  Antinuclear antibodies were negative.  HIV status negative.  Arterial blood gases on April 22nd on 6 liters were 7.434, PACO2 of  40.5, PACO2 of 26.7 and oxygen saturation 77.3.  The patient had urine  strip test which was negative.  Angio-converting enzyme 41.  Legionella  urine was negative.  Hemoglobin A1c was 6.3.  She had repeat rheumatoid  factor on April 26th after prednisone therapy, and it was less than 20.  Remember it was 27 on admission.  A repeat ESR on April 26th was 20.  Her admission ESR was 59.  The CRP was 5.2.  It is high risk greater  than 3.  Repeat antinuclear antibody was negative.  The angiotensin-  converting enzyme was 37.  Other laboratory studies at the time of  discharge:  Her pro-time was 29.8, INR 2.6, magnesium 2.2.  Basic  metabolic panel:  Sodium 0000000, potassium 4.1, chloride 106, carbonate 24,  glucose 162, BUN is 26, creatinine 0.9.  This admission alkaline  phosphatase 76, SGOT 35, SGPT is 19.  Complete blood count:  The last  one was on April 25th.  The white cells 22.9, hemoglobin 10.7,  hematocrit 31.5, platelets of 408.  Complete blood count on April 22nd:  White cells 17.9, hemoglobin 10.2, hematocrit 30, platelets are 319.  This patient has been on steroid therapy throughout this  hospitalization, and this could account for the elevated leukocytes.  The  patient on admission on April 20th:  Complete blood count with white  cells of 13.2 a hemoglobin 11.1, hematocrit 33.5, platelets 314.      Sueanne Margarita, [place]      Deboraha Sprang, MD, [facility]  Electronically Signed    GM/MEDQ  D:  04/20/2009  T:  04/20/2009  Job:  [id number]   cc:   Frazier Richards, M.D.

## 2011-06-10 ENCOUNTER — Encounter: Payer: Self-pay | Admitting: Cardiovascular Disease

## 2011-12-16 ENCOUNTER — Emergency Department: Payer: Self-pay | Admitting: Internal Medicine

## 2013-05-05 DIAGNOSIS — G4733 Obstructive sleep apnea (adult) (pediatric): Secondary | ICD-10-CM | POA: Insufficient documentation

## 2013-05-05 DIAGNOSIS — Z86718 Personal history of other venous thrombosis and embolism: Secondary | ICD-10-CM | POA: Insufficient documentation

## 2013-05-05 DIAGNOSIS — I776 Arteritis, unspecified: Secondary | ICD-10-CM | POA: Insufficient documentation

## 2013-05-05 DIAGNOSIS — Z86711 Personal history of pulmonary embolism: Secondary | ICD-10-CM | POA: Insufficient documentation

## 2013-05-28 ENCOUNTER — Encounter: Payer: Self-pay | Admitting: Internal Medicine

## 2013-05-28 ENCOUNTER — Ambulatory Visit (INDEPENDENT_AMBULATORY_CARE_PROVIDER_SITE_OTHER): Payer: 59 | Admitting: Internal Medicine

## 2013-05-28 VITALS — BP 100/40 | HR 73 | Temp 99.4°F | Ht 69.1 in | Wt 248.8 lb

## 2013-05-28 DIAGNOSIS — I10 Essential (primary) hypertension: Secondary | ICD-10-CM

## 2013-05-28 DIAGNOSIS — E119 Type 2 diabetes mellitus without complications: Secondary | ICD-10-CM

## 2013-05-28 DIAGNOSIS — I509 Heart failure, unspecified: Secondary | ICD-10-CM

## 2013-05-28 DIAGNOSIS — F329 Major depressive disorder, single episode, unspecified: Secondary | ICD-10-CM

## 2013-05-28 DIAGNOSIS — I110 Hypertensive heart disease with heart failure: Secondary | ICD-10-CM

## 2013-05-28 DIAGNOSIS — I4891 Unspecified atrial fibrillation: Secondary | ICD-10-CM

## 2013-05-28 DIAGNOSIS — G4733 Obstructive sleep apnea (adult) (pediatric): Secondary | ICD-10-CM

## 2013-05-28 DIAGNOSIS — I776 Arteritis, unspecified: Secondary | ICD-10-CM

## 2013-05-28 DIAGNOSIS — I7789 Other specified disorders of arteries and arterioles: Secondary | ICD-10-CM

## 2013-05-28 DIAGNOSIS — R32 Unspecified urinary incontinence: Secondary | ICD-10-CM

## 2013-05-28 DIAGNOSIS — K219 Gastro-esophageal reflux disease without esophagitis: Secondary | ICD-10-CM

## 2013-05-29 LAB — PROTIME-INR
INR: 2.34 — ABNORMAL HIGH (ref <1.50)
Prothrombin Time: 24.8 s — ABNORMAL HIGH (ref 11.6–15.2)

## 2013-05-30 ENCOUNTER — Encounter: Payer: Self-pay | Admitting: Internal Medicine

## 2013-05-30 DIAGNOSIS — Z860100 Personal history of colon polyps, unspecified: Secondary | ICD-10-CM | POA: Insufficient documentation

## 2013-05-30 DIAGNOSIS — R32 Unspecified urinary incontinence: Secondary | ICD-10-CM | POA: Insufficient documentation

## 2013-05-30 DIAGNOSIS — E119 Type 2 diabetes mellitus without complications: Secondary | ICD-10-CM | POA: Insufficient documentation

## 2013-05-30 NOTE — Assessment & Plan Note (Signed)
Per pt, last colonoscopy 2013.  Obtain records.

## 2013-05-30 NOTE — Assessment & Plan Note (Signed)
Uses CPAP.  Follow.

## 2013-05-30 NOTE — Assessment & Plan Note (Signed)
Controlled on current medication regimen.  Follow.   

## 2013-05-30 NOTE — Assessment & Plan Note (Signed)
On citalopram and wellbutrin.  Symptoms as outlined.  Discussed counseling.  She declines at this time.  Follow.  Hold on making changes in her medication regimen.

## 2013-05-30 NOTE — Assessment & Plan Note (Signed)
Has a history of urinary incontinence.  Has seen urology.  Obtain records.

## 2013-05-30 NOTE — Assessment & Plan Note (Signed)
Per pt, stable.  Followed by Dr Rosalita Chessman (rheumatology).

## 2013-05-30 NOTE — Progress Notes (Signed)
Subjective:    Patient ID: Betty [name], female    DOB: 04-22-1946, 67 y.o.   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  Is followed by Dr Rosalita Chessman (rheumatology - [facility]).  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  She spent 4.5-5 months in rehab.  She is able to walk and works part time.  She is "slow moving" according to the patient.  She also has a history of atrial fib and is s/p two ablations.  On coumadin.  Has a history of DVT as well.  Was followed by Dr Joella Prince at [facility].  She also has a history of hypertension, diabetes, urinary incontinence and depression.  She comes in today to follow up on these issues as well as to establish care.  States her sugars average 120 in the AM and 140s in the PM.  No chest pain or tightness.  No increased heart rate or palpitations.  Some incontinence.  Has had a bladder "stimulator".  Did not help.  No bowel change.  Breathing stable.  Some increased depression.  Takes citalopram and wellbutrin.  States she gets down because she cannot do the things she used to could do.  No suicidal ideations.     Past Medical History  Diagnosis Date  . Headache(784.0)   . Backache, unspecified   . Herpes zoster without mention of complication   . Osteoarthrosis, unspecified whether generalized or localized, unspecified site   . Nontoxic uninodular goiter   . Obesity, unspecified   . Esophageal reflux   . Unspecified sleep apnea   . Atrial fibrillation   . Acute diastolic heart failure   . Cardiomegaly   . Hypertension     heart controlled w CHF  . Asthma   . Diabetes mellitus without complication   . Allergy   . Hx: UTI (urinary tract infection)   . Urine incontinence     hx of  . ANCA-associated vasculitis     Outpatient Encounter Prescriptions as of 05/28/2013  Medication Sig Dispense Refill  . buPROPion (WELLBUTRIN SR) 200 MG 12 hr tablet Take 200 mg by mouth daily.      .  calcium-vitamin D (OSCAL WITH D) 500-200 MG-UNIT per tablet Take 1 tablet by mouth daily.      . citalopram (CELEXA) 20 MG tablet Take 10 mg by mouth daily.      Marland Kitchen diltiazem (TIAZAC) 240 MG 24 hr capsule Take 240 mg by mouth daily.      . furosemide (LASIX) 40 MG tablet Take 40 mg by mouth daily.      Marland Kitchen gabapentin (NEURONTIN) 600 MG tablet Take 600 mg by mouth 2 (two) times daily.      Marland Kitchen glipiZIDE (GLUCOTROL) 10 MG tablet Take 10 mg by mouth daily.      Marland Kitchen losartan (COZAAR) 50 MG tablet Take 50 mg by mouth daily.      . metFORMIN (GLUCOPHAGE) 1000 MG tablet Take 1,000 mg by mouth daily.      Marland Kitchen omeprazole (PRILOSEC) 40 MG capsule Take 40 mg by mouth daily.      . traZODone (DESYREL) 100 MG tablet Take 100 mg by mouth 2 (two) times daily.      Marland Kitchen triamterene-hydrochlorothiazide (DYAZIDE) 37.5-25 MG per capsule Take 1 capsule by mouth daily.      Marland Kitchen warfarin (COUMADIN) 4 MG tablet Take 4 mg by mouth daily.      . [  DISCONTINUED] aspirin buffered (BUFFERIN) 325 MG TABS tablet Take 325 mg by mouth daily.        . [DISCONTINUED] metoprolol (TOPROL XL) 50 MG 24 hr tablet Take 50 mg by mouth daily.         No facility-administered encounter medications on file as of 05/28/2013.    Review of Systems Patient denies any headache, lightheadedness or dizziness.  No significant sinus or allergy symptoms currently.  No chest pain, tightness or palpitations.  No increased shortness of breath, cough or congestion.  No acid reflux.  Controlled.  No nausea or vomiting.  No abdominal pain or cramping.  No bowel change, such as diarrhea, constipation, BRBPR or melana.  Some urinary incontinence.  Also has sleep apnea.  Uses CPAP.  States she is up to date with her colonoscopy.  Last 2013.  Has a history of colon polyps.   Some depression as outlined above.  Sees rheumatology regularly.       Objective:   Physical Exam Filed Vitals:   05/28/13 1532  BP: 100/40  Pulse: 73  Temp: 99.4 F (37.4 C)   Blood pressure  recheck:  110/60, pulse 54  68 year old female in no acute distress.   HEENT:  Nares- clear.  Oropharynx - without lesions. NECK:  Supple.  Nontender.  No audible bruit.  HEART:  Appears to be regular. LUNGS:  No crackles or wheezing audible.  Respirations even and unlabored.  RADIAL PULSE:  Equal bilaterally.   ABDOMEN:  Soft, nontender.  Bowel sounds present and normal.  No audible abdominal bruit.    EXTREMITIES:  No increased edema present.  DP pulses palpable and equal bilaterally.      SKIN:  No rash.       Assessment & Plan:  HEALTH MAINTENANCE.  Will schedule her for a physical next visit.  Has had a hysterectomy, but had suspicious cells with her paps.  Will need pelvic/pap.  Obtain last mammogram results.  Up to date with colonoscopy.  Last 2013.  Obtain records from outside for review.    I spent over 45 minutes with this patient and more than 50% of the time was spent in consultation regarding the above.

## 2013-05-30 NOTE — Assessment & Plan Note (Signed)
On coumadin.  Check pt/inr today.  Rate controlled.

## 2013-05-30 NOTE — Assessment & Plan Note (Signed)
Sugars as outlined.  Check metabolic panel and [id number].  Obtain records.

## 2013-05-30 NOTE — Assessment & Plan Note (Signed)
Blood pressure controlled.  Will continue same medication regimen.  Follow.

## 2013-06-23 ENCOUNTER — Other Ambulatory Visit (INDEPENDENT_AMBULATORY_CARE_PROVIDER_SITE_OTHER): Payer: 59

## 2013-06-23 DIAGNOSIS — I4891 Unspecified atrial fibrillation: Secondary | ICD-10-CM

## 2013-06-23 LAB — PROTIME-INR: INR: 2.07 — ABNORMAL HIGH (ref <1.50)

## 2013-06-30 ENCOUNTER — Other Ambulatory Visit: Payer: 59

## 2013-07-15 ENCOUNTER — Other Ambulatory Visit: Payer: 59

## 2013-07-15 DIAGNOSIS — I4891 Unspecified atrial fibrillation: Secondary | ICD-10-CM

## 2013-07-15 LAB — PROTIME-INR: Prothrombin Time: 24.8 seconds — ABNORMAL HIGH (ref 11.6–15.2)

## 2013-07-16 ENCOUNTER — Telehealth: Payer: Self-pay | Admitting: Internal Medicine

## 2013-07-16 ENCOUNTER — Encounter: Payer: Self-pay | Admitting: Internal Medicine

## 2013-07-16 NOTE — Telephone Encounter (Signed)
Pt notified of lab results and the need for a f/u blood thinning level in one month.   Please schedule her for a non fasting lab appt in one month and call her with appt date and time.  Thanks.

## 2013-07-19 NOTE — Telephone Encounter (Signed)
Left message for pt to call office  Let her know about lab appointment on 08/17/13  Mailed appointment to pt

## 2013-07-28 ENCOUNTER — Encounter: Payer: Self-pay | Admitting: *Deleted

## 2013-07-28 ENCOUNTER — Other Ambulatory Visit: Payer: Self-pay

## 2013-08-17 ENCOUNTER — Other Ambulatory Visit (INDEPENDENT_AMBULATORY_CARE_PROVIDER_SITE_OTHER): Payer: 59

## 2013-08-17 ENCOUNTER — Other Ambulatory Visit: Payer: 59

## 2013-08-17 DIAGNOSIS — I4891 Unspecified atrial fibrillation: Secondary | ICD-10-CM

## 2013-08-17 LAB — PROTIME-INR: INR: 2.5 ratio — ABNORMAL HIGH (ref 0.8–1.0)

## 2013-08-18 ENCOUNTER — Encounter: Payer: Self-pay | Admitting: Internal Medicine

## 2013-08-18 ENCOUNTER — Telehealth: Payer: Self-pay | Admitting: Internal Medicine

## 2013-08-18 DIAGNOSIS — I4891 Unspecified atrial fibrillation: Secondary | ICD-10-CM

## 2013-08-18 NOTE — Telephone Encounter (Signed)
Pt notified of lab results and need for repeat PT/INR in one month.  Please schedule her for a non fasting lab appt in one month and call her with an appointment date and time.   Thanks.

## 2013-08-19 ENCOUNTER — Encounter: Payer: 59 | Admitting: Internal Medicine

## 2013-08-19 NOTE — Telephone Encounter (Signed)
Appointment 9/30  Pt aware

## 2013-08-20 ENCOUNTER — Encounter: Payer: 59 | Admitting: Internal Medicine

## 2013-08-24 NOTE — Telephone Encounter (Signed)
Mailed unread message to pt  

## 2013-08-27 ENCOUNTER — Other Ambulatory Visit (HOSPITAL_COMMUNITY)
Admission: RE | Admit: 2013-08-27 | Discharge: 2013-08-27 | Disposition: A | Discharge status: Home / Self Care | Payer: Medicare Other | Source: Ambulatory Visit | Attending: Internal Medicine | Admitting: Internal Medicine

## 2013-08-27 ENCOUNTER — Encounter: Payer: Self-pay | Admitting: Internal Medicine

## 2013-08-27 ENCOUNTER — Ambulatory Visit (INDEPENDENT_AMBULATORY_CARE_PROVIDER_SITE_OTHER): Payer: 59 | Admitting: Internal Medicine

## 2013-08-27 VITALS — BP 118/70 | HR 84 | Temp 98.7°F | Ht 68.5 in | Wt 254.5 lb

## 2013-08-27 DIAGNOSIS — Z1151 Encounter for screening for human papillomavirus (HPV): Secondary | ICD-10-CM | POA: Insufficient documentation

## 2013-08-27 DIAGNOSIS — F329 Major depressive disorder, single episode, unspecified: Secondary | ICD-10-CM

## 2013-08-27 DIAGNOSIS — Z860100 Personal history of colon polyps, unspecified: Secondary | ICD-10-CM

## 2013-08-27 DIAGNOSIS — G4733 Obstructive sleep apnea (adult) (pediatric): Secondary | ICD-10-CM

## 2013-08-27 DIAGNOSIS — I776 Arteritis, unspecified: Secondary | ICD-10-CM

## 2013-08-27 DIAGNOSIS — E119 Type 2 diabetes mellitus without complications: Secondary | ICD-10-CM

## 2013-08-27 DIAGNOSIS — Z124 Encounter for screening for malignant neoplasm of cervix: Secondary | ICD-10-CM

## 2013-08-27 DIAGNOSIS — Z01419 Encounter for gynecological examination (general) (routine) without abnormal findings: Secondary | ICD-10-CM | POA: Insufficient documentation

## 2013-08-27 DIAGNOSIS — K219 Gastro-esophageal reflux disease without esophagitis: Secondary | ICD-10-CM

## 2013-08-27 DIAGNOSIS — I110 Hypertensive heart disease with heart failure: Secondary | ICD-10-CM

## 2013-08-27 DIAGNOSIS — I7789 Other specified disorders of arteries and arterioles: Secondary | ICD-10-CM

## 2013-08-27 DIAGNOSIS — I4891 Unspecified atrial fibrillation: Secondary | ICD-10-CM

## 2013-08-27 DIAGNOSIS — F3289 Other specified depressive episodes: Secondary | ICD-10-CM

## 2013-08-29 ENCOUNTER — Encounter: Payer: Self-pay | Admitting: Internal Medicine

## 2013-08-29 NOTE — Assessment & Plan Note (Signed)
Per pt, stable.  Followed by Dr Rosalita Chessman (rheumatology).   On imuran now.  Counts being followed by Dr Rosalita Chessman.

## 2013-08-29 NOTE — Assessment & Plan Note (Signed)
On coumadin.  Follow pt/inr.   Rate controlled.

## 2013-08-29 NOTE — Assessment & Plan Note (Signed)
Uses CPAP.  Follow.

## 2013-08-29 NOTE — Progress Notes (Addendum)
Subjective:    Patient ID: Betty [name], female    DOB: January 05, 1946, 67 y.o.   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  Is followed by Dr Rosalita Chessman (rheumatology - [facility]).  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  She spent 4.5-5 months in rehab.  She is able to walk and works part time.  She is "slow moving" according to the patient.  She also has a history of atrial fib and is s/p two ablations.  On coumadin.  Has a history of DVT as well.  She also has a history of hypertension, diabetes, urinary incontinence and depression.  She comes in today to follow up on these issues as well as for a complete physical exam.   No chest pain or tightness.  No increased heart rate or palpitations.  No bowel change.  Breathing stable.  Recently evaluated by Dr Candee Furbish.  Started on azathioprine.  Tolerating.  Due labs one month after starting.  Is off citaolpram now.  Overall feels things are relatively stable.  Does report some fatigue.      Past Medical History  Diagnosis Date  . Headache(784.0)   . Backache, unspecified   . Herpes zoster without mention of complication   . Osteoarthrosis, unspecified whether generalized or localized, unspecified site   . Nontoxic uninodular goiter   . Obesity, unspecified   . Esophageal reflux   . Unspecified sleep apnea   . Atrial fibrillation   . Acute diastolic heart failure   . Cardiomegaly   . Hypertension     heart controlled w CHF  . Asthma   . Diabetes mellitus without complication   . Allergy   . Hx: UTI (urinary tract infection)   . Urine incontinence     hx of  . ANCA-associated vasculitis     Outpatient Encounter Prescriptions as of 08/27/2013  Medication Sig Dispense Refill  . azaTHIOprine (IMURAN) 50 MG tablet Take 100 mg by mouth daily.      Marland Kitchen buPROPion (WELLBUTRIN SR) 200 MG 12 hr tablet Take 200 mg by mouth daily.      . calcium-vitamin D (OSCAL WITH  D) 500-200 MG-UNIT per tablet Take 1 tablet by mouth daily.      Marland Kitchen diltiazem (TIAZAC) 240 MG 24 hr capsule Take 240 mg by mouth daily.      . furosemide (LASIX) 40 MG tablet Take 40 mg by mouth daily.      Marland Kitchen gabapentin (NEURONTIN) 600 MG tablet Take 600 mg by mouth 2 (two) times daily.      Marland Kitchen glipiZIDE (GLUCOTROL) 10 MG tablet Take 10 mg by mouth daily.      Marland Kitchen losartan (COZAAR) 50 MG tablet Take 50 mg by mouth daily.      . metFORMIN (GLUCOPHAGE) 1000 MG tablet Take 1,000 mg by mouth daily.      Marland Kitchen omeprazole (PRILOSEC) 40 MG capsule Take 40 mg by mouth daily.      . traZODone (DESYREL) 100 MG tablet Take 100 mg by mouth 2 (two) times daily.      Marland Kitchen triamterene-hydrochlorothiazide (DYAZIDE) 37.5-25 MG per capsule Take 1 capsule by mouth daily.      Marland Kitchen warfarin (COUMADIN) 4 MG tablet Take 4 mg by mouth daily.      . [DISCONTINUED] citalopram (CELEXA) 20 MG tablet Take 10 mg by mouth daily.  No facility-administered encounter medications on file as of 08/27/2013.    Review of Systems Patient denies any headache, lightheadedness or dizziness.  No significant sinus or allergy symptoms currently.  No chest pain, tightness or palpitations.  No increased shortness of breath, cough or congestion.  No acid reflux.  Controlled.  No nausea or vomiting.  No abdominal pain or cramping.  No bowel change, such as diarrhea, constipation, BRBPR or melana.   Also has sleep apnea.  Uses CPAP.  States she is up to date with her colonoscopy.  Last 2013.  Has a history of colon polyps.   Sees rheumatology regularly.   Just started on imuran.       Objective:   Physical Exam  Filed Vitals:   08/27/13 1543  BP: 118/70  Pulse: 84  Temp: 98.7 F (37.1 C)   Pulse 54  67 year old female in no acute distress.   HEENT:  Nares- clear.  Oropharynx - without lesions. NECK:  Supple.  Nontender.  No audible bruit.  HEART:  Appears to be regular. LUNGS:  No crackles or wheezing audible.  Respirations even and  unlabored.  RADIAL PULSE:  Equal bilaterally.    BREASTS:  No nipple discharge or nipple retraction present.  Could not appreciate any distinct nodules or axillary adenopathy.  ABDOMEN:  Soft, nontender.  Bowel sounds present and normal.  No audible abdominal bruit.  GU:  Normal external genitalia.  Vaginal vault without lesions.  Is s/p hysterectomy.  Pap performed. Could not appreciate any adnexal masses or tenderness.   RECTAL:  Heme negative.   EXTREMITIES:  No increased edema present.  DP pulses palpable and equal bilaterally.      FEET:  No lesions.      Assessment & Plan:  HEALTH MAINTENANCE.  Physical today.   Has had a hysterectomy, but had suspicious cells with her paps.  Pelvic/pap today.  States up to date with colonoscopy.  Last 2013.    Information given for her to schedule her mammogram.  05/26/12 - normal bone density.

## 2013-08-29 NOTE — Assessment & Plan Note (Signed)
On wellbutrin.  Off citalopram.  Appears stable.  Follow.

## 2013-08-29 NOTE — Assessment & Plan Note (Signed)
Blood pressure controlled.  Will continue same medication regimen.  Follow.

## 2013-08-29 NOTE — Assessment & Plan Note (Signed)
Sugars as outlined.  Follow metabolic panel and [id number].  Obtain records.

## 2013-08-29 NOTE — Assessment & Plan Note (Signed)
Per pt, last colonoscopy 2013.

## 2013-08-29 NOTE — Assessment & Plan Note (Signed)
Controlled on current medication regimen.  Follow.   

## 2013-08-30 ENCOUNTER — Encounter: Payer: Self-pay | Admitting: Internal Medicine

## 2013-08-30 DIAGNOSIS — Z86010 Personal history of colonic polyps: Secondary | ICD-10-CM

## 2013-08-30 DIAGNOSIS — K219 Gastro-esophageal reflux disease without esophagitis: Secondary | ICD-10-CM

## 2013-08-30 DIAGNOSIS — E559 Vitamin D deficiency, unspecified: Secondary | ICD-10-CM

## 2013-09-01 ENCOUNTER — Encounter: Payer: Self-pay | Admitting: Internal Medicine

## 2013-09-09 DIAGNOSIS — E559 Vitamin D deficiency, unspecified: Secondary | ICD-10-CM | POA: Insufficient documentation

## 2013-09-20 ENCOUNTER — Other Ambulatory Visit: Payer: 59

## 2013-09-21 ENCOUNTER — Other Ambulatory Visit (INDEPENDENT_AMBULATORY_CARE_PROVIDER_SITE_OTHER): Payer: 59

## 2013-09-21 DIAGNOSIS — Z7901 Long term (current) use of anticoagulants: Secondary | ICD-10-CM

## 2013-09-21 DIAGNOSIS — I4891 Unspecified atrial fibrillation: Secondary | ICD-10-CM

## 2013-09-24 ENCOUNTER — Encounter: Payer: Self-pay | Admitting: *Deleted

## 2013-09-24 ENCOUNTER — Other Ambulatory Visit: Payer: Self-pay | Admitting: *Deleted

## 2013-09-24 ENCOUNTER — Other Ambulatory Visit (INDEPENDENT_AMBULATORY_CARE_PROVIDER_SITE_OTHER): Payer: 59

## 2013-09-24 DIAGNOSIS — I4891 Unspecified atrial fibrillation: Secondary | ICD-10-CM

## 2013-09-24 DIAGNOSIS — Z7901 Long term (current) use of anticoagulants: Secondary | ICD-10-CM

## 2013-09-24 NOTE — Telephone Encounter (Signed)
Advised pt that she will need to continue to have her rheumatologist refill her pain med. Also advised her that she can make an appt to see Dr. Nicki Reaper regarding her ankle problem & to discuss medication at that time. Pt signed the controlled substance contact but will need to repeat UDS next visit if controlled substance is subscribed.

## 2013-09-24 NOTE — Telephone Encounter (Signed)
Pt requesting a refill

## 2013-09-24 NOTE — Telephone Encounter (Signed)
Please ask her what she takes this for and how often she takes.  I did not have this down in my notes as to her taking.  Thanks.

## 2013-09-24 NOTE — Telephone Encounter (Signed)
Pt states that she takes it for rt. ankle pain given by her rheumatologist. She mentioned it to him during her routine visit.

## 2013-09-24 NOTE — Telephone Encounter (Signed)
Noted  

## 2013-09-25 ENCOUNTER — Telehealth: Payer: Self-pay | Admitting: Internal Medicine

## 2013-09-25 ENCOUNTER — Encounter: Payer: Self-pay | Admitting: Internal Medicine

## 2013-09-25 DIAGNOSIS — Z7901 Long term (current) use of anticoagulants: Secondary | ICD-10-CM

## 2013-09-25 NOTE — Telephone Encounter (Signed)
Pt notified of lab results and need for repeat blood thinning level in 10 days.  Please contact pt with appt date and time.  Thanks.

## 2013-09-27 ENCOUNTER — Telehealth: Payer: Self-pay | Admitting: Internal Medicine

## 2013-09-27 ENCOUNTER — Ambulatory Visit: Payer: 59 | Admitting: Adult Health

## 2013-09-27 NOTE — Telephone Encounter (Signed)
Pt could not come in today.  She stated she could not come in Tuesday or  Thursday.  This week she will be working 9  To 2:30.  She would like appointment before 9 or after 2:30.  She has been out of work a lot with her  spouse and mother

## 2013-09-27 NOTE — Telephone Encounter (Signed)
Please see if the 3:30 on 10/01/13 could come in at 11:30 on10/10/14 and if so, then please put Ms Dimsdale in the 3:30 spot on 10/01/13.  Thanks.

## 2013-09-27 NOTE — Telephone Encounter (Signed)
Dr Nicki Reaper The 3 ok 10/21 cannot come in @ 11:45.  Pt stated 3 works best for her.  Please advise when you want ms owens to come in Thanks  Robin

## 2013-09-27 NOTE — Telephone Encounter (Signed)
See if the patient on 10/12/13 at the 3:00 spot can come in the same day at 11:45 (instead of 3:00).  If so, then have Ms Gatling come in at 3:00 on 10/12/13.  Thanks.

## 2013-09-27 NOTE — Telephone Encounter (Signed)
Left message for pt at 3 to call office

## 2013-09-29 NOTE — Telephone Encounter (Signed)
Betty [name] made appointment pt aware

## 2013-09-29 NOTE — Telephone Encounter (Signed)
Dr Nicki Reaper will talk to pt about this on her appointment 10/10

## 2013-10-01 ENCOUNTER — Ambulatory Visit (INDEPENDENT_AMBULATORY_CARE_PROVIDER_SITE_OTHER): Payer: Medicare Other | Admitting: Internal Medicine

## 2013-10-01 ENCOUNTER — Encounter: Payer: Self-pay | Admitting: Internal Medicine

## 2013-10-01 VITALS — BP 110/70 | HR 65 | Temp 99.0°F | Ht 68.5 in | Wt 252.8 lb

## 2013-10-01 DIAGNOSIS — F329 Major depressive disorder, single episode, unspecified: Secondary | ICD-10-CM

## 2013-10-01 DIAGNOSIS — E559 Vitamin D deficiency, unspecified: Secondary | ICD-10-CM

## 2013-10-01 DIAGNOSIS — M79609 Pain in unspecified limb: Secondary | ICD-10-CM

## 2013-10-01 DIAGNOSIS — G4733 Obstructive sleep apnea (adult) (pediatric): Secondary | ICD-10-CM

## 2013-10-01 DIAGNOSIS — I7789 Other specified disorders of arteries and arterioles: Secondary | ICD-10-CM

## 2013-10-01 DIAGNOSIS — I776 Arteritis, unspecified: Secondary | ICD-10-CM

## 2013-10-01 DIAGNOSIS — M79604 Pain in right leg: Secondary | ICD-10-CM

## 2013-10-01 DIAGNOSIS — I4891 Unspecified atrial fibrillation: Secondary | ICD-10-CM

## 2013-10-01 DIAGNOSIS — K219 Gastro-esophageal reflux disease without esophagitis: Secondary | ICD-10-CM

## 2013-10-01 DIAGNOSIS — I110 Hypertensive heart disease with heart failure: Secondary | ICD-10-CM

## 2013-10-01 DIAGNOSIS — M549 Dorsalgia, unspecified: Secondary | ICD-10-CM

## 2013-10-01 MED ORDER — HYDROCODONE-ACETAMINOPHEN 5-325 MG PO TABS: 1.0000 | ORAL_TABLET | Freq: Two times a day (BID) | ORAL | Status: DC | PRN

## 2013-10-04 ENCOUNTER — Other Ambulatory Visit (INDEPENDENT_AMBULATORY_CARE_PROVIDER_SITE_OTHER): Payer: Medicare Other

## 2013-10-04 ENCOUNTER — Ambulatory Visit (INDEPENDENT_AMBULATORY_CARE_PROVIDER_SITE_OTHER)
Admission: RE | Admit: 2013-10-04 | Discharge: 2013-10-04 | Disposition: A | Discharge status: Home / Self Care | Payer: Medicare Other | Source: Ambulatory Visit | Attending: Internal Medicine | Admitting: Internal Medicine

## 2013-10-04 DIAGNOSIS — M549 Dorsalgia, unspecified: Secondary | ICD-10-CM

## 2013-10-04 DIAGNOSIS — M79604 Pain in right leg: Secondary | ICD-10-CM

## 2013-10-04 DIAGNOSIS — Z7901 Long term (current) use of anticoagulants: Secondary | ICD-10-CM

## 2013-10-04 DIAGNOSIS — M79609 Pain in unspecified limb: Secondary | ICD-10-CM

## 2013-10-04 LAB — PROTIME-INR: INR: 2.4 ratio — ABNORMAL HIGH (ref 0.8–1.0)

## 2013-10-05 ENCOUNTER — Encounter: Payer: Self-pay | Admitting: Internal Medicine

## 2013-10-05 ENCOUNTER — Telehealth: Payer: Self-pay | Admitting: Internal Medicine

## 2013-10-05 DIAGNOSIS — Z7901 Long term (current) use of anticoagulants: Secondary | ICD-10-CM

## 2013-10-05 NOTE — Telephone Encounter (Signed)
Pt notified of lab results and need for f/u non fasting lab in 2 weeks.  Please schedule and contact her with an appt date and time.  Thanks.

## 2013-10-06 NOTE — Telephone Encounter (Signed)
Left pt message on home phone asking her to call office

## 2013-10-07 NOTE — Telephone Encounter (Signed)
Appointment 10/29 pt aware

## 2013-10-07 NOTE — Telephone Encounter (Signed)
Left message for pt, notifying of results and requested call back regarding referral to Dr. Sharlet Salina.

## 2013-10-07 NOTE — Telephone Encounter (Signed)
Left message, notifying pt of results. Pt had called this morning and scheduled her BW appt.

## 2013-10-10 ENCOUNTER — Encounter: Payer: Self-pay | Admitting: Internal Medicine

## 2013-10-10 NOTE — Progress Notes (Signed)
Subjective:    Patient ID: Betty [name], female    DOB: 02/26/1946, 67 y.o.   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  Is followed by Dr Rosalita Chessman (rheumatology - [facility]).  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  She spent 4.5-5 months in rehab.  She is able to walk and works part time.  She is "slow moving" according to the patient.  She also has a history of atrial fib and is s/p two ablations.  On coumadin.  Has a history of DVT as well.  She also has a history of hypertension, diabetes, urinary incontinence and depression.  She comes in today as a work in with concerns regarding persistent ankle pain.  Has been present for one year.  Has seen [facility] and is being followed by Dr Rosalita Chessman (rheumatology) and Priscille Loveless at [facility].   It flares intermittently.  Involves the right leg and also the ankle.  Ankle swells.  Otherwise feels things are stable.  No chest pain or tightness.  No increased heart rate or palpitations.  No bowel change.  Breathing stable.  Recently evaluated by Dr Candee Furbish.  Started on azathioprine.  Tolerating.   Overall feels things are relatively stable.  Increased stress.       Past Medical History  Diagnosis Date  . Headache(784.0)   . Backache, unspecified   . Herpes zoster without mention of complication   . Osteoarthrosis, unspecified whether generalized or localized, unspecified site   . Nontoxic uninodular goiter   . Obesity, unspecified   . Esophageal reflux   . Unspecified sleep apnea   . Atrial fibrillation   . Acute diastolic heart failure   . Cardiomegaly   . Hypertension     heart controlled w CHF  . Asthma   . Diabetes mellitus without complication   . Allergy   . Hx: UTI (urinary tract infection)   . Urine incontinence     hx of  . ANCA-associated vasculitis     Outpatient Encounter Prescriptions as of 10/01/2013  Medication Sig Dispense Refill  .  azaTHIOprine (IMURAN) 50 MG tablet Take 100 mg by mouth daily.      Marland Kitchen buPROPion (WELLBUTRIN SR) 200 MG 12 hr tablet Take 200 mg by mouth daily.      . calcium-vitamin D (OSCAL WITH D) 500-200 MG-UNIT per tablet Take 1 tablet by mouth daily.      Marland Kitchen diltiazem (TIAZAC) 240 MG 24 hr capsule Take 240 mg by mouth daily.      . furosemide (LASIX) 40 MG tablet Take 40 mg by mouth daily.      Marland Kitchen gabapentin (NEURONTIN) 600 MG tablet Take 600 mg by mouth 2 (two) times daily.      Marland Kitchen glipiZIDE (GLUCOTROL) 10 MG tablet Take 10 mg by mouth daily.      Marland Kitchen losartan (COZAAR) 50 MG tablet Take 50 mg by mouth daily.      . metFORMIN (GLUCOPHAGE) 1000 MG tablet Take 1,000 mg by mouth daily.      Marland Kitchen omeprazole (PRILOSEC) 40 MG capsule Take 40 mg by mouth daily.      . traZODone (DESYREL) 100 MG tablet Take 100 mg by mouth 2 (two) times daily.      Marland Kitchen triamterene-hydrochlorothiazide (DYAZIDE) 37.5-25 MG per capsule Take 1 capsule by mouth daily.      Marland Kitchen warfarin (  COUMADIN) 4 MG tablet Take 4 mg by mouth daily.      Marland Kitchen HYDROcodone-acetaminophen (NORCO) 5-325 MG per tablet Take 1 tablet by mouth 2 (two) times daily as needed for pain.  40 tablet  0   No facility-administered encounter medications on file as of 10/01/2013.    Review of Systems Patient denies any headache, lightheadedness or dizziness.  No significant sinus or allergy symptoms currently.  No chest pain, tightness or palpitations.  No increased shortness of breath, cough or congestion.  No acid reflux.  Controlled. No nausea or vomiting.  No abdominal pain or cramping.  No bowel change, such as diarrhea, constipation, BRBPR or melana.   Also has sleep apnea.  Uses CPAP.   Has a history of colon polyps.   Sees rheumatology regularly.   Just started on imuran.  Right ankle and leg pain as outlined.  See above.      Objective:   Physical Exam  Filed Vitals:   10/01/13 1534  BP: 110/70  Pulse: 65  Temp: 99 F (37.2 C)   Pulse 39  67 year old female in  no acute distress.  HEART:  Appears to be regular. LUNGS:  No crackles or wheezing audible.  Respirations even and unlabored.  RADIAL PULSE:  Equal bilaterally.    ABDOMEN:  Soft, nontender.  Bowel sounds present and normal.  No audible abdominal bruit.    EXTREMITIES:  No increased edema present.  Relatively stable.  Some ankle swelling.   DP pulses palpable and equal bilaterally.   MSK:  Some increased pain with full extension of her leg.      FEET:  No lesions.      Assessment & Plan:  MSK.  Increased ankle and leg pain as outlined.  Check L-S spine xray.  Refilled her Norco.  Avoid antiinflammatories.  Follow.    HEALTH MAINTENANCE.  Physical 08/27/13.   Has had a hysterectomy, but had suspicious cells with her paps.  Pelvic/pap last visit.  Pap negative with negative HPV.   States up to date with colonoscopy.  Last 2013.    Information given for her to schedule her mammogram (last visit).  Needs to schedule.   05/26/12 - normal bone density.

## 2013-10-10 NOTE — Assessment & Plan Note (Signed)
Per pt, stable.  Followed by Dr Rosalita Chessman (rheumatology).   On imuran now.  Counts being followed by Dr Rosalita Chessman.

## 2013-10-10 NOTE — Assessment & Plan Note (Signed)
Blood pressure controlled.  Will continue same medication regimen.  Follow.

## 2013-10-10 NOTE — Assessment & Plan Note (Signed)
Uses CPAP.  Follow.

## 2013-10-10 NOTE — Assessment & Plan Note (Signed)
Follow vitamin D level.  

## 2013-10-10 NOTE — Assessment & Plan Note (Signed)
On coumadin.  Follow pt/inr.   Rate controlled.  Avoid antiinflammatories.

## 2013-10-10 NOTE — Assessment & Plan Note (Signed)
On wellbutrin.  Off citalopram.  Feels she is coping relatively well.  Discussed the increase stress with her today.  Follow.

## 2013-10-10 NOTE — Assessment & Plan Note (Signed)
Controlled on current medication regimen.  Follow.  Avoid antiinflammatories.

## 2013-10-20 ENCOUNTER — Other Ambulatory Visit (INDEPENDENT_AMBULATORY_CARE_PROVIDER_SITE_OTHER): Payer: Medicare Other

## 2013-10-20 DIAGNOSIS — Z5181 Encounter for therapeutic drug level monitoring: Secondary | ICD-10-CM

## 2013-10-20 DIAGNOSIS — Z7901 Long term (current) use of anticoagulants: Secondary | ICD-10-CM

## 2013-10-21 LAB — PROTIME-INR
INR: 1.2 ratio — ABNORMAL HIGH (ref 0.8–1.0)
Prothrombin Time: 12.4 s (ref 10.2–12.4)

## 2013-10-22 ENCOUNTER — Other Ambulatory Visit: Payer: Self-pay | Admitting: Internal Medicine

## 2013-10-22 ENCOUNTER — Telehealth: Payer: Self-pay | Admitting: Internal Medicine

## 2013-10-22 DIAGNOSIS — Z7901 Long term (current) use of anticoagulants: Secondary | ICD-10-CM

## 2013-10-22 NOTE — Telephone Encounter (Signed)
Pt coming Monday for lab work.  Pt asking if she can also get Tetanus, Shingles, and pneumonia shot while she is here.  States she received the [place].  Please call pt on cell to let her know if she will be receiving any of these injections on Monday.  Please leave msg if she does not answer.Marland Kitchen

## 2013-10-22 NOTE — Telephone Encounter (Signed)
Spoke with pt, notified to check with insurance regarding Tdap and Shingles vaccine on coverage/cost. Advised she may discuss pneumonia vaccine with Dr. Nicki Reaper at her upcoming appointment. Pt verbally agreed.

## 2013-10-22 NOTE — Progress Notes (Signed)
Order placed for f/u pt/inr

## 2013-10-25 ENCOUNTER — Other Ambulatory Visit (INDEPENDENT_AMBULATORY_CARE_PROVIDER_SITE_OTHER): Payer: Medicare Other

## 2013-10-25 DIAGNOSIS — Z7901 Long term (current) use of anticoagulants: Secondary | ICD-10-CM

## 2013-10-25 DIAGNOSIS — Z5181 Encounter for therapeutic drug level monitoring: Secondary | ICD-10-CM

## 2013-10-26 LAB — PROTIME-INR
INR: 1.2 ratio — ABNORMAL HIGH (ref 0.8–1.0)
Prothrombin Time: 12.4 s (ref 10.2–12.4)

## 2013-10-28 ENCOUNTER — Other Ambulatory Visit: Payer: Self-pay | Admitting: *Deleted

## 2013-10-28 MED ORDER — WARFARIN SODIUM 1 MG PO TABS: 1.0000 mg | ORAL_TABLET | Freq: Every day | ORAL | Status: DC

## 2013-10-29 ENCOUNTER — Other Ambulatory Visit (INDEPENDENT_AMBULATORY_CARE_PROVIDER_SITE_OTHER): Payer: Medicare Other

## 2013-10-29 DIAGNOSIS — Z7901 Long term (current) use of anticoagulants: Secondary | ICD-10-CM

## 2013-10-29 LAB — PROTIME-INR
INR: 2.3 ratio — ABNORMAL HIGH (ref 0.8–1.0)
Prothrombin Time: 24 s — ABNORMAL HIGH (ref 10.2–12.4)

## 2013-11-02 ENCOUNTER — Other Ambulatory Visit (INDEPENDENT_AMBULATORY_CARE_PROVIDER_SITE_OTHER): Payer: Medicare Other

## 2013-11-02 DIAGNOSIS — Z7901 Long term (current) use of anticoagulants: Secondary | ICD-10-CM

## 2013-11-03 LAB — PROTIME-INR: Prothrombin Time: 14.9 s — ABNORMAL HIGH (ref 10.2–12.4)

## 2013-11-08 ENCOUNTER — Other Ambulatory Visit (INDEPENDENT_AMBULATORY_CARE_PROVIDER_SITE_OTHER): Payer: Medicare Other

## 2013-11-08 DIAGNOSIS — Z7901 Long term (current) use of anticoagulants: Secondary | ICD-10-CM

## 2013-11-08 LAB — PROTIME-INR: Prothrombin Time: 31 s — ABNORMAL HIGH (ref 10.2–12.4)

## 2013-11-09 ENCOUNTER — Ambulatory Visit (INDEPENDENT_AMBULATORY_CARE_PROVIDER_SITE_OTHER): Payer: Medicare Other | Admitting: Internal Medicine

## 2013-11-09 ENCOUNTER — Encounter: Payer: Self-pay | Admitting: Internal Medicine

## 2013-11-09 ENCOUNTER — Encounter: Payer: Self-pay | Admitting: *Deleted

## 2013-11-09 VITALS — BP 120/70 | HR 73 | Temp 98.8°F | Ht 68.5 in | Wt 246.5 lb

## 2013-11-09 DIAGNOSIS — I7789 Other specified disorders of arteries and arterioles: Secondary | ICD-10-CM

## 2013-11-09 DIAGNOSIS — I776 Arteritis, unspecified: Secondary | ICD-10-CM

## 2013-11-09 DIAGNOSIS — M542 Cervicalgia: Secondary | ICD-10-CM

## 2013-11-09 MED ORDER — HYDROCODONE-ACETAMINOPHEN 5-325 MG PO TABS: 1.0000 | ORAL_TABLET | Freq: Two times a day (BID) | ORAL | Status: DC | PRN

## 2013-11-09 NOTE — Progress Notes (Signed)
Pre-visit discussion using our clinic review tool. No additional management support is needed unless otherwise documented below in the visit note.  

## 2013-11-10 ENCOUNTER — Encounter: Payer: Self-pay | Admitting: *Deleted

## 2013-11-10 ENCOUNTER — Encounter: Payer: Self-pay | Admitting: Internal Medicine

## 2013-11-10 ENCOUNTER — Ambulatory Visit: Payer: Self-pay | Admitting: Internal Medicine

## 2013-11-10 DIAGNOSIS — M542 Cervicalgia: Secondary | ICD-10-CM | POA: Insufficient documentation

## 2013-11-10 NOTE — Progress Notes (Signed)
Subjective:    Patient ID: Betty [name], female    DOB: 12/07/1946, 67 y.o.   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  Is followed by Dr Rosalita Chessman (rheumatology - [facility]).  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  She spent 4.5-5 months in rehab.  She is able to walk and works part time.  She is "slow moving" according to the patient.  She also has a history of atrial fib and is s/p two ablations.  On coumadin.  Has a history of DVT as well.  She also has a history of hypertension, diabetes, urinary incontinence and depression.  She comes in today as a work in with concerns regarding increased neck and shoulder pain.  States that she first noticed pain in the right side of her neck four days ago.  The following day - pain and swelling worsened.  No known injury or trauma.  Some increased pain with reaching forward and with abduction - especially at or above 90 degrees.  Increased soft tissue swelling.  Larger a few days ago.   No chest pain or tightness.  No increased heart rate or palpitations.  No bowel change.  Breathing stable.  Has been taking some hydrocodone.     Past Medical History  Diagnosis Date  . Headache(784.0)   . Backache, unspecified   . Herpes zoster without mention of complication   . Osteoarthrosis, unspecified whether generalized or localized, unspecified site   . Nontoxic uninodular goiter   . Obesity, unspecified   . Esophageal reflux   . Unspecified sleep apnea   . Atrial fibrillation   . Acute diastolic heart failure   . Cardiomegaly   . Hypertension     heart controlled w CHF  . Asthma   . Diabetes mellitus without complication   . Allergy   . Hx: UTI (urinary tract infection)   . Urine incontinence     hx of  . ANCA-associated vasculitis     Outpatient Encounter Prescriptions as of 11/09/2013  Medication Sig  . azaTHIOprine (IMURAN) 50 MG tablet Take 100 mg by mouth  daily.  Marland Kitchen buPROPion (WELLBUTRIN SR) 200 MG 12 hr tablet Take 200 mg by mouth daily.  . calcium-vitamin D (OSCAL WITH D) 500-200 MG-UNIT per tablet Take 1 tablet by mouth daily.  Marland Kitchen diltiazem (TIAZAC) 240 MG 24 hr capsule Take 240 mg by mouth daily.  . furosemide (LASIX) 40 MG tablet Take 40 mg by mouth daily.  Marland Kitchen gabapentin (NEURONTIN) 600 MG tablet Take 600 mg by mouth 2 (two) times daily.  Marland Kitchen glipiZIDE (GLUCOTROL) 10 MG tablet Take 10 mg by mouth daily.  Marland Kitchen HYDROcodone-acetaminophen (NORCO) 5-325 MG per tablet Take 1 tablet by mouth 2 (two) times daily as needed.  Marland Kitchen losartan (COZAAR) 50 MG tablet Take 50 mg by mouth daily.  . metFORMIN (GLUCOPHAGE) 1000 MG tablet Take 1,000 mg by mouth daily.  Marland Kitchen omeprazole (PRILOSEC) 40 MG capsule Take 40 mg by mouth daily.  . traZODone (DESYREL) 100 MG tablet Take 100 mg by mouth 2 (two) times daily.  Marland Kitchen triamterene-hydrochlorothiazide (DYAZIDE) 37.5-25 MG per capsule Take 1 capsule by mouth daily.  Marland Kitchen warfarin (COUMADIN) 1 MG tablet Take 1 tablet (1 mg total) by mouth daily.  Marland Kitchen warfarin (COUMADIN) 4 MG tablet Take 4 mg by mouth daily.  . [DISCONTINUED] HYDROcodone-acetaminophen (NORCO) 5-325 MG per tablet Take 1 tablet  by mouth 2 (two) times daily as needed for pain.    Review of Systems  Skin: Positive for rash.  Patient denies any headache, lightheadedness or dizziness.  No significant sinus or allergy symptoms currently.  No chest pain, tightness or palpitations.  No increased shortness of breath, cough or congestion.  Neck and shoulder pain as outlined.  No known injury or trauma.  No rash now.  No increased erythema or warmth.  No pain with rotation of her head.      Objective:   Physical Exam  Filed Vitals:   11/09/13 1510  BP: 120/70  Pulse: 73  Temp: 98.8 F (29.68 C)   67 year old female in no acute distress.   HEENT:  Oropharynx - without lesions. NECK:  Supple.  Increased soft tissue swelling - right lateral neck.  No increased erythema or  warmth.  Increased tenderness.  No pain with rotation of her head.   HEART:  Appears to be regular. LUNGS:  No crackles or wheezing audible.  Respirations even and unlabored.  RADIAL PULSE:  Equal bilaterally.    MSK:  Increased pain with abduction of her right arm - especially at or above 90 degrees.  No pain with rotation of her head.   SKIN:  No rash.       Assessment & Plan:  HEALTH MAINTENANCE.  Physical last visit.   Has had a hysterectomy, but had suspicious cells with her paps.  Pelvic/pap last visit (08/27/13) - ok.   States up to date with colonoscopy.  Last 2013.    Information given for her to schedule her mammogram - last visit.  05/26/12 - normal bone density.

## 2013-11-10 NOTE — Assessment & Plan Note (Signed)
Per pt, stable.  Followed by Dr Rosalita Chessman (rheumatology).   On imuran now.  Counts being followed by Dr Rosalita Chessman.

## 2013-11-10 NOTE — Assessment & Plan Note (Signed)
Increased pain and swelling as outlined.  Unclear as to the exact etiology.  Increased pain with abduction and certain positions - right arm.  This appears to be more c/w a msk origin.  Does have the increased soft tissue swelling and pain.  Has a history of DVT.  On coumadin.  Some variation in her pt/inr recently.  Will check CT scan - to further evaluate.  Hydrocodone refilled to help with pain.  Further w/up pending.  Pt comfortable with this plan.

## 2013-11-11 ENCOUNTER — Other Ambulatory Visit: Payer: Medicare Other

## 2013-11-11 ENCOUNTER — Other Ambulatory Visit: Payer: Self-pay | Admitting: Internal Medicine

## 2013-11-11 ENCOUNTER — Telehealth: Payer: Self-pay | Admitting: Internal Medicine

## 2013-11-11 DIAGNOSIS — I776 Arteritis, unspecified: Secondary | ICD-10-CM

## 2013-11-11 DIAGNOSIS — I6529 Occlusion and stenosis of unspecified carotid artery: Secondary | ICD-10-CM

## 2013-11-11 DIAGNOSIS — Z7901 Long term (current) use of anticoagulants: Secondary | ICD-10-CM

## 2013-11-11 NOTE — Telephone Encounter (Signed)
Pt notified of CT results.  Notified of need for carotid ultrasound.  Will schedule.  She did not have her labs performed at the hospital.  She states - due to a "mix up" at the hospital.  She will come in tomorrow for lab f/u.  She will stay hydrated.  Feels better.  Pain decreased.  Will follow.

## 2013-11-11 NOTE — Progress Notes (Signed)
Opened in error

## 2013-11-12 ENCOUNTER — Other Ambulatory Visit (INDEPENDENT_AMBULATORY_CARE_PROVIDER_SITE_OTHER): Payer: Medicare Other

## 2013-11-12 DIAGNOSIS — I776 Arteritis, unspecified: Secondary | ICD-10-CM

## 2013-11-12 DIAGNOSIS — Z5181 Encounter for therapeutic drug level monitoring: Secondary | ICD-10-CM

## 2013-11-12 DIAGNOSIS — Z7901 Long term (current) use of anticoagulants: Secondary | ICD-10-CM

## 2013-11-12 LAB — PROTIME-INR
INR: 2.5 ratio — ABNORMAL HIGH (ref 0.8–1.0)
Prothrombin Time: 26.4 s — ABNORMAL HIGH (ref 10.2–12.4)

## 2013-11-12 LAB — COMPREHENSIVE METABOLIC PANEL
AST: 36 U/L (ref 0–37)
Alkaline Phosphatase: 62 U/L (ref 39–117)
BUN: 16 mg/dL (ref 6–23)
CO2: 31 mEq/L (ref 19–32)
Chloride: 98 mEq/L (ref 96–112)
Creatinine, Ser: 0.8 mg/dL (ref 0.4–1.2)
Glucose, Bld: 137 mg/dL — ABNORMAL HIGH (ref 70–99)
Total Bilirubin: 0.1 mg/dL — ABNORMAL LOW (ref 0.3–1.2)

## 2013-11-15 ENCOUNTER — Telehealth: Payer: Self-pay | Admitting: Internal Medicine

## 2013-11-15 ENCOUNTER — Other Ambulatory Visit: Payer: Self-pay | Admitting: Internal Medicine

## 2013-11-15 DIAGNOSIS — Z7901 Long term (current) use of anticoagulants: Secondary | ICD-10-CM

## 2013-11-15 NOTE — Telephone Encounter (Signed)
Order placed for pt/inr 

## 2013-11-15 NOTE — Telephone Encounter (Signed)
See below

## 2013-11-15 NOTE — Progress Notes (Signed)
Order placed for pt/inr 

## 2013-11-15 NOTE — Telephone Encounter (Signed)
Pt called to make appt for f/u labs.  Scheduled 11/25.  No order in.

## 2013-11-17 ENCOUNTER — Other Ambulatory Visit: Payer: Medicare Other

## 2013-11-19 ENCOUNTER — Other Ambulatory Visit (INDEPENDENT_AMBULATORY_CARE_PROVIDER_SITE_OTHER): Payer: Medicare Other

## 2013-11-19 DIAGNOSIS — Z7901 Long term (current) use of anticoagulants: Secondary | ICD-10-CM

## 2013-11-19 LAB — PROTIME-INR: Prothrombin Time: 30.1 s — ABNORMAL HIGH (ref 10.2–12.4)

## 2013-11-26 ENCOUNTER — Other Ambulatory Visit: Payer: Medicare Other

## 2013-11-29 ENCOUNTER — Other Ambulatory Visit (INDEPENDENT_AMBULATORY_CARE_PROVIDER_SITE_OTHER): Payer: Medicare Other

## 2013-11-29 ENCOUNTER — Ambulatory Visit: Payer: 59 | Admitting: Internal Medicine

## 2013-11-29 ENCOUNTER — Encounter: Payer: Self-pay | Admitting: Internal Medicine

## 2013-11-29 DIAGNOSIS — I6529 Occlusion and stenosis of unspecified carotid artery: Secondary | ICD-10-CM

## 2013-11-29 DIAGNOSIS — Z7901 Long term (current) use of anticoagulants: Secondary | ICD-10-CM

## 2013-11-29 LAB — PROTIME-INR
INR: 3.1 ratio — ABNORMAL HIGH (ref 0.8–1.0)
Prothrombin Time: 32.3 s — ABNORMAL HIGH (ref 10.2–12.4)

## 2013-12-01 ENCOUNTER — Other Ambulatory Visit: Payer: Self-pay | Admitting: *Deleted

## 2013-12-01 MED ORDER — GABAPENTIN 600 MG PO TABS: 600.0000 mg | ORAL_TABLET | Freq: Two times a day (BID) | ORAL | Status: DC

## 2013-12-01 NOTE — Telephone Encounter (Signed)
Order placed for vascular surgery referral.   

## 2013-12-03 ENCOUNTER — Other Ambulatory Visit (INDEPENDENT_AMBULATORY_CARE_PROVIDER_SITE_OTHER): Payer: Medicare Other

## 2013-12-03 DIAGNOSIS — Z7901 Long term (current) use of anticoagulants: Secondary | ICD-10-CM

## 2013-12-03 LAB — PROTIME-INR
INR: 2.5 ratio — ABNORMAL HIGH (ref 0.8–1.0)
Prothrombin Time: 25.7 s — ABNORMAL HIGH (ref 10.2–12.4)

## 2013-12-04 ENCOUNTER — Encounter: Payer: Self-pay | Admitting: *Deleted

## 2013-12-13 ENCOUNTER — Other Ambulatory Visit (INDEPENDENT_AMBULATORY_CARE_PROVIDER_SITE_OTHER): Payer: Medicare Other

## 2013-12-13 DIAGNOSIS — Z7901 Long term (current) use of anticoagulants: Secondary | ICD-10-CM

## 2013-12-13 LAB — PROTIME-INR: INR: 3.1 ratio — ABNORMAL HIGH (ref 0.8–1.0)

## 2013-12-17 ENCOUNTER — Other Ambulatory Visit: Payer: Medicare Other

## 2013-12-20 ENCOUNTER — Other Ambulatory Visit (INDEPENDENT_AMBULATORY_CARE_PROVIDER_SITE_OTHER): Payer: Medicare Other

## 2013-12-20 DIAGNOSIS — Z7901 Long term (current) use of anticoagulants: Secondary | ICD-10-CM

## 2013-12-22 ENCOUNTER — Encounter: Payer: Self-pay | Admitting: Internal Medicine

## 2013-12-22 ENCOUNTER — Ambulatory Visit (INDEPENDENT_AMBULATORY_CARE_PROVIDER_SITE_OTHER): Payer: Medicare Other | Admitting: Internal Medicine

## 2013-12-22 VITALS — BP 120/60 | HR 79 | Temp 98.9°F | Wt 248.5 lb

## 2013-12-22 DIAGNOSIS — M542 Cervicalgia: Secondary | ICD-10-CM

## 2013-12-22 DIAGNOSIS — F329 Major depressive disorder, single episode, unspecified: Secondary | ICD-10-CM

## 2013-12-22 DIAGNOSIS — I7789 Other specified disorders of arteries and arterioles: Secondary | ICD-10-CM

## 2013-12-22 DIAGNOSIS — M549 Dorsalgia, unspecified: Secondary | ICD-10-CM

## 2013-12-22 DIAGNOSIS — R350 Frequency of micturition: Secondary | ICD-10-CM

## 2013-12-22 DIAGNOSIS — N390 Urinary tract infection, site not specified: Secondary | ICD-10-CM

## 2013-12-22 DIAGNOSIS — Z86010 Personal history of colonic polyps: Secondary | ICD-10-CM

## 2013-12-22 DIAGNOSIS — G4733 Obstructive sleep apnea (adult) (pediatric): Secondary | ICD-10-CM

## 2013-12-22 DIAGNOSIS — E559 Vitamin D deficiency, unspecified: Secondary | ICD-10-CM

## 2013-12-22 DIAGNOSIS — E119 Type 2 diabetes mellitus without complications: Secondary | ICD-10-CM

## 2013-12-22 DIAGNOSIS — K219 Gastro-esophageal reflux disease without esophagitis: Secondary | ICD-10-CM

## 2013-12-22 DIAGNOSIS — K649 Unspecified hemorrhoids: Secondary | ICD-10-CM

## 2013-12-22 DIAGNOSIS — I110 Hypertensive heart disease with heart failure: Secondary | ICD-10-CM

## 2013-12-22 DIAGNOSIS — I4891 Unspecified atrial fibrillation: Secondary | ICD-10-CM

## 2013-12-22 DIAGNOSIS — I776 Arteritis, unspecified: Secondary | ICD-10-CM

## 2013-12-22 LAB — POCT URINALYSIS DIPSTICK
Bilirubin, UA: NEGATIVE
Glucose, UA: NEGATIVE
Ketones, UA: NEGATIVE
Leukocytes, UA: NEGATIVE
Protein, UA: NEGATIVE
Spec Grav, UA: 1.015

## 2013-12-22 MED ORDER — HYDROCORTISONE ACE-PRAMOXINE 1-1 % RE CREA: 1.0000 "application " | TOPICAL_CREAM | Freq: Two times a day (BID) | RECTAL | Status: DC

## 2013-12-22 MED ORDER — HYDROCODONE-ACETAMINOPHEN 5-325 MG PO TABS: 1.0000 | ORAL_TABLET | Freq: Two times a day (BID) | ORAL | Status: DC | PRN

## 2013-12-22 NOTE — Progress Notes (Signed)
Pre-visit discussion using our clinic review tool. No additional management support is needed unless otherwise documented below in the visit note.  

## 2013-12-24 ENCOUNTER — Other Ambulatory Visit: Payer: Self-pay | Admitting: *Deleted

## 2013-12-24 ENCOUNTER — Encounter: Payer: Self-pay | Admitting: Internal Medicine

## 2013-12-24 DIAGNOSIS — K649 Unspecified hemorrhoids: Secondary | ICD-10-CM | POA: Insufficient documentation

## 2013-12-24 DIAGNOSIS — R350 Frequency of micturition: Secondary | ICD-10-CM | POA: Insufficient documentation

## 2013-12-24 LAB — URINE CULTURE: Colony Count: >=100000

## 2013-12-24 MED ORDER — CEPHALEXIN 500 MG PO CAPS: 500.0000 mg | ORAL_CAPSULE | Freq: Three times a day (TID) | ORAL | Status: DC

## 2013-12-24 NOTE — Progress Notes (Signed)
Subjective:    Patient ID: Betty [name], female    DOB: September 20, 1946, 68 y.o.   MRN: [id number]  Urinary Frequency  Associated symptoms include frequency.  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  Is followed by Dr Rosalita Chessman (rheumatology - [facility]).  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  She spent 4.5-5 months in rehab.  She is able to walk and works part time.  She is "slow moving" according to the patient.  She also has a history of atrial fib and is s/p two ablations.  On coumadin.  Has a history of DVT as well.  She also has a history of hypertension, diabetes, urinary incontinence and depression.  She comes in today for a scheduled follow up.   No chest pain or tightness.  No increased heart rate or palpitations.  No bowel change.  Breathing stable.  Followed by Dr Candee Furbish.  Started on azathioprine.  Tolerating.  Some increased stress.  A little more depressed over the last two weeks.  Does not fell she needs anything more at this time.  Ankle and back pain are better.  Some foot pain now.  She has noticed some cough at night.  Some drainage.  Only occurred for 2-3 nights.  Did not occur last night.  No sob.  No chest congestion.  Some increased urinary frequency.  Some problems with hemorrhoids.  Some itching and burning.  No bleeding.  Present for one month.  Has used Preparation H.      Past Medical History  Diagnosis Date  . Headache(784.0)   . Backache, unspecified   . Herpes zoster without mention of complication   . Osteoarthrosis, unspecified whether generalized or localized, unspecified site   . Nontoxic uninodular goiter   . Obesity, unspecified   . Esophageal reflux   . Unspecified sleep apnea   . Atrial fibrillation   . Acute diastolic heart failure   . Cardiomegaly   . Hypertension     heart controlled w CHF  . Asthma   . Diabetes mellitus without complication   . Allergy   . Hx: UTI (urinary tract  infection)   . Urine incontinence     hx of  . ANCA-associated vasculitis     Outpatient Encounter Prescriptions as of 12/22/2013  Medication Sig  . azaTHIOprine (IMURAN) 50 MG tablet Take 100 mg by mouth daily.  Marland Kitchen buPROPion (WELLBUTRIN SR) 200 MG 12 hr tablet Take 200 mg by mouth daily.  . calcium-vitamin D (OSCAL WITH D) 500-200 MG-UNIT per tablet Take 1 tablet by mouth daily.  Marland Kitchen diltiazem (TIAZAC) 240 MG 24 hr capsule Take 240 mg by mouth daily.  . furosemide (LASIX) 40 MG tablet Take 40 mg by mouth daily.  Marland Kitchen gabapentin (NEURONTIN) 600 MG tablet Take 1 tablet (600 mg total) by mouth 2 (two) times daily.  Marland Kitchen glipiZIDE (GLUCOTROL) 10 MG tablet Take 10 mg by mouth daily.  Marland Kitchen HYDROcodone-acetaminophen (NORCO) 5-325 MG per tablet Take 1 tablet by mouth 2 (two) times daily as needed.  Marland Kitchen losartan (COZAAR) 50 MG tablet Take 50 mg by mouth daily.  . metFORMIN (GLUCOPHAGE) 1000 MG tablet Take 1,000 mg by mouth daily.  Marland Kitchen omeprazole (PRILOSEC) 40 MG capsule Take 40 mg by mouth daily.  . traZODone (DESYREL) 100 MG tablet Take 100 mg by mouth 2 (two) times daily.  Marland Kitchen triamterene-hydrochlorothiazide (DYAZIDE) 37.5-25 MG per capsule Take 1 capsule  by mouth daily.  Marland Kitchen warfarin (COUMADIN) 1 MG tablet Take 1 tablet (1 mg total) by mouth daily.  Marland Kitchen warfarin (COUMADIN) 4 MG tablet Take 4 mg by mouth daily.  . [DISCONTINUED] HYDROcodone-acetaminophen (NORCO) 5-325 MG per tablet Take 1 tablet by mouth 2 (two) times daily as needed.  . pramoxine-hydrocortisone (ANALPRAM-HC) 1-1 % rectal cream Place 1 application rectally 2 (two) times daily.    Review of Systems  Genitourinary: Positive for frequency.  Patient denies any headache, lightheadedness or dizziness.  No significant sinus or allergy symptoms currently.  Some drainage.  Some minimal cough.  None last night.   No chest pain, tightness or palpitations.  No increased shortness of breath.  No chest congestion.   No acid reflux.  Controlled.  No nausea or  vomiting.  No abdominal pain or cramping.  No bowel change, such as diarrhea, constipation, BRBPR or melana.   Also has sleep apnea.  Uses CPAP.  States she is up to date with her colonoscopy.  Last 2013.  Has a history of colon polyps.   Sees rheumatology regularly.   On imuran.  Hemorrhoidal issues as outlined.  No bleeding.  Urinary symptoms as outlined.       Objective:   Physical Exam  Filed Vitals:   12/22/13 0904  BP: 120/60  Pulse: 79  Temp: 98.9 F (61.33 C)   68 year old female in no acute distress.   HEENT:  Nares- clear.  Oropharynx - without lesions. NECK:  Supple.  Nontender.  No audible bruit.  HEART:  Appears to be regular. LUNGS:  No crackles or wheezing audible.  Respirations even and unlabored.  RADIAL PULSE:  Equal bilaterally.  ABDOMEN:  Soft, nontender.  Bowel sounds present and normal.  No audible abdominal bruit.  RECTAL:  Small skin tag present.  No rectal irritation.   EXTREMITIES:  No increased edema present.  DP pulses palpable and equal bilaterally.      FEET:  No lesions.      Assessment & Plan:  HEALTH MAINTENANCE.  Physical 08/27/13.   Has had a hysterectomy, but had suspicious cells with her paps.  Pelvic/pap at physical.  Pap negative with negative HPV.   States up to date with colonoscopy.  Last 2013.  Schedule her mammogram.   05/26/12 - normal bone density.

## 2013-12-24 NOTE — Assessment & Plan Note (Signed)
Follow vitamin D level.  

## 2013-12-24 NOTE — Assessment & Plan Note (Signed)
On coumadin.  Follow pt/inr.   Rate controlled.  Avoid antiinflammatories.

## 2013-12-24 NOTE — Assessment & Plan Note (Signed)
Per pt, last colonoscopy 2013.

## 2013-12-24 NOTE — Assessment & Plan Note (Signed)
Check urinalysis and culture to confirm no infection.    

## 2013-12-24 NOTE — Assessment & Plan Note (Signed)
Pain resolved.   

## 2013-12-24 NOTE — Assessment & Plan Note (Signed)
Controlled on current medication regimen.  Follow.  Avoid antiinflammatories.

## 2013-12-24 NOTE — Assessment & Plan Note (Signed)
Uses CPAP.  Follow.

## 2013-12-24 NOTE — Assessment & Plan Note (Signed)
Per pt, stable.  Followed by Dr Rosalita Chessman (rheumatology).   On imuran now.  Counts being followed by Dr Rosalita Chessman.

## 2013-12-24 NOTE — Assessment & Plan Note (Signed)
Has improved 

## 2013-12-24 NOTE — Assessment & Plan Note (Signed)
Exam as outlined.  No significant irritation.  analpram as directed.  Follow.

## 2013-12-24 NOTE — Assessment & Plan Note (Signed)
Sugars as outlined.  Follow metabolic panel and [id number].

## 2013-12-24 NOTE — Assessment & Plan Note (Signed)
Blood pressure controlled.  Will continue same medication regimen.  Follow.

## 2013-12-24 NOTE — Assessment & Plan Note (Signed)
On wellbutrin.  Off citalopram.  Feels she is coping relatively well.  Some minimal increased depression.  Does not feel she needs anything more at this point.  Follow.

## 2013-12-27 ENCOUNTER — Other Ambulatory Visit: Payer: Self-pay | Admitting: Internal Medicine

## 2013-12-28 ENCOUNTER — Other Ambulatory Visit: Payer: Medicare Other

## 2013-12-31 ENCOUNTER — Other Ambulatory Visit: Payer: Self-pay | Admitting: Internal Medicine

## 2014-01-03 ENCOUNTER — Other Ambulatory Visit (INDEPENDENT_AMBULATORY_CARE_PROVIDER_SITE_OTHER): Payer: Medicare Other

## 2014-01-03 ENCOUNTER — Other Ambulatory Visit: Payer: Medicare Other

## 2014-01-03 ENCOUNTER — Telehealth: Payer: Self-pay | Admitting: *Deleted

## 2014-01-03 ENCOUNTER — Other Ambulatory Visit: Payer: Self-pay | Admitting: *Deleted

## 2014-01-03 DIAGNOSIS — I7789 Other specified disorders of arteries and arterioles: Secondary | ICD-10-CM

## 2014-01-03 DIAGNOSIS — I776 Arteritis, unspecified: Secondary | ICD-10-CM

## 2014-01-03 DIAGNOSIS — I509 Heart failure, unspecified: Secondary | ICD-10-CM

## 2014-01-03 DIAGNOSIS — G4733 Obstructive sleep apnea (adult) (pediatric): Secondary | ICD-10-CM

## 2014-01-03 DIAGNOSIS — I7782 Antineutrophilic cytoplasmic antibody (ANCA) vasculitis: Secondary | ICD-10-CM

## 2014-01-03 DIAGNOSIS — E119 Type 2 diabetes mellitus without complications: Secondary | ICD-10-CM

## 2014-01-03 DIAGNOSIS — I4891 Unspecified atrial fibrillation: Secondary | ICD-10-CM

## 2014-01-03 DIAGNOSIS — I110 Hypertensive heart disease with heart failure: Secondary | ICD-10-CM

## 2014-01-03 NOTE — Telephone Encounter (Signed)
Pt stated that her medication for Coumadin 7 mg every day, but 6 mg on Tuesdays and Thursdays.  That it wasn't called in correct, can you please fix this?

## 2014-01-04 ENCOUNTER — Other Ambulatory Visit: Payer: Self-pay | Admitting: *Deleted

## 2014-01-04 ENCOUNTER — Encounter: Payer: Self-pay | Admitting: *Deleted

## 2014-01-04 ENCOUNTER — Other Ambulatory Visit: Payer: Self-pay | Admitting: Internal Medicine

## 2014-01-04 DIAGNOSIS — Z7901 Long term (current) use of anticoagulants: Secondary | ICD-10-CM

## 2014-01-04 DIAGNOSIS — R7989 Other specified abnormal findings of blood chemistry: Secondary | ICD-10-CM

## 2014-01-04 DIAGNOSIS — R945 Abnormal results of liver function studies: Secondary | ICD-10-CM

## 2014-01-04 LAB — CBC WITH DIFFERENTIAL/PLATELET
BASOS ABS: 0 10*3/uL (ref 0.0–0.1)
Basophils Relative: 0.1 % (ref 0.0–3.0)
EOS PCT: 0.8 % (ref 0.0–5.0)
Eosinophils Absolute: 0.1 10*3/uL (ref 0.0–0.7)
HCT: 35.2 % — ABNORMAL LOW (ref 36.0–46.0)
Hemoglobin: 11.4 g/dL — ABNORMAL LOW (ref 12.0–15.0)
LYMPHS PCT: 14.1 % (ref 12.0–46.0)
Lymphs Abs: 1 10*3/uL (ref 0.7–4.0)
MCHC: 32.4 g/dL (ref 30.0–36.0)
MCV: 79.6 fl (ref 78.0–100.0)
MONOS PCT: 8.1 % (ref 3.0–12.0)
Monocytes Absolute: 0.6 10*3/uL (ref 0.1–1.0)
Neutro Abs: 5.6 10*3/uL (ref 1.4–7.7)
Neutrophils Relative %: 76.9 % (ref 43.0–77.0)
PLATELETS: 319 10*3/uL (ref 150.0–400.0)
RBC: 4.42 Mil/uL (ref 3.87–5.11)
RDW: 16.6 % — AB (ref 11.5–14.6)
WBC: 7.2 10*3/uL (ref 4.5–10.5)

## 2014-01-04 LAB — MICROALBUMIN / CREATININE URINE RATIO
CREATININE, U: 83.5 mg/dL
MICROALB/CREAT RATIO: 0.2 mg/g (ref 0.0–30.0)
Microalb, Ur: 0.2 mg/dL (ref 0.0–1.9)

## 2014-01-04 LAB — BASIC METABOLIC PANEL
BUN: 20 mg/dL (ref 6–23)
CHLORIDE: 95 meq/L — AB (ref 96–112)
CO2: 29 meq/L (ref 19–32)
Calcium: 9.1 mg/dL (ref 8.4–10.5)
Creatinine, Ser: 0.8 mg/dL (ref 0.4–1.2)
GFR: 77.14 mL/min (ref >60.00)
GLUCOSE: 75 mg/dL (ref 70–99)
POTASSIUM: 3.6 meq/L (ref 3.5–5.1)
SODIUM: 136 meq/L (ref 135–145)

## 2014-01-04 LAB — HEPATIC FUNCTION PANEL
ALBUMIN: 3.7 g/dL (ref 3.5–5.2)
ALK PHOS: 50 U/L (ref 39–117)
ALT: 73 U/L — ABNORMAL HIGH (ref 0–35)
AST: 102 U/L — AB (ref 0–37)
BILIRUBIN DIRECT: 0.1 mg/dL (ref 0.0–0.3)
TOTAL PROTEIN: 7.4 g/dL (ref 6.0–8.3)
Total Bilirubin: 0.5 mg/dL (ref 0.3–1.2)

## 2014-01-04 LAB — TSH: TSH: 1.56 u[IU]/mL (ref 0.35–5.50)

## 2014-01-04 LAB — PROTIME-INR
INR: 1.2 ratio — ABNORMAL HIGH (ref 0.8–1.0)
PROTHROMBIN TIME: 12.7 s — AB (ref 10.2–12.4)

## 2014-01-04 LAB — HEMOGLOBIN A1C: Hgb A1c MFr Bld: 6.2 % (ref 4.6–6.5)

## 2014-01-04 MED ORDER — WARFARIN SODIUM 1 MG PO TABS: ORAL_TABLET | ORAL | Status: DC

## 2014-01-04 NOTE — Telephone Encounter (Signed)
Resent the 1 mg Coumadin with new directions "use as directed". Also sent pt a mychart to see if she needs her 4mg  refilled.

## 2014-01-04 NOTE — Telephone Encounter (Signed)
Pt's current dosing directions are: 6mg  on Tues & Thurs, & 7mg  all other days

## 2014-01-04 NOTE — Progress Notes (Signed)
Orders placed for f/u labs.  

## 2014-01-05 ENCOUNTER — Encounter: Payer: Self-pay | Admitting: Internal Medicine

## 2014-01-05 ENCOUNTER — Encounter: Payer: Self-pay | Admitting: *Deleted

## 2014-01-05 DIAGNOSIS — R7989 Other specified abnormal findings of blood chemistry: Secondary | ICD-10-CM

## 2014-01-05 DIAGNOSIS — R945 Abnormal results of liver function studies: Secondary | ICD-10-CM

## 2014-01-05 NOTE — Progress Notes (Signed)
Sent mychart message, waiting for response

## 2014-01-06 NOTE — Telephone Encounter (Signed)
Order placed for lab.  See other my chart message for response to pt.

## 2014-01-07 ENCOUNTER — Other Ambulatory Visit (INDEPENDENT_AMBULATORY_CARE_PROVIDER_SITE_OTHER): Payer: Medicare Other

## 2014-01-07 DIAGNOSIS — Z7901 Long term (current) use of anticoagulants: Secondary | ICD-10-CM

## 2014-01-07 DIAGNOSIS — R945 Abnormal results of liver function studies: Secondary | ICD-10-CM

## 2014-01-07 DIAGNOSIS — R7989 Other specified abnormal findings of blood chemistry: Secondary | ICD-10-CM

## 2014-01-07 DIAGNOSIS — Z5181 Encounter for therapeutic drug level monitoring: Secondary | ICD-10-CM

## 2014-01-07 LAB — HEPATIC FUNCTION PANEL
ALT: 65 U/L — ABNORMAL HIGH (ref 0–35)
AST: 59 U/L — ABNORMAL HIGH (ref 0–37)
Albumin: 3.8 g/dL (ref 3.5–5.2)
Alkaline Phosphatase: 58 U/L (ref 39–117)
BILIRUBIN DIRECT: 0.1 mg/dL (ref 0.0–0.3)
BILIRUBIN TOTAL: 0.4 mg/dL (ref 0.3–1.2)
Total Protein: 7.1 g/dL (ref 6.0–8.3)

## 2014-01-07 LAB — PROTIME-INR
INR: 4 ratio — ABNORMAL HIGH (ref 0.8–1.0)
PROTHROMBIN TIME: 41.5 s — AB (ref 10.2–12.4)

## 2014-01-10 ENCOUNTER — Other Ambulatory Visit (INDEPENDENT_AMBULATORY_CARE_PROVIDER_SITE_OTHER): Payer: Medicare Other

## 2014-01-10 DIAGNOSIS — Z5181 Encounter for therapeutic drug level monitoring: Secondary | ICD-10-CM

## 2014-01-10 LAB — PROTIME-INR
INR: 2 ratio — ABNORMAL HIGH (ref 0.8–1.0)
Prothrombin Time: 20.7 s — ABNORMAL HIGH (ref 10.2–12.4)

## 2014-01-11 ENCOUNTER — Ambulatory Visit: Payer: Self-pay | Admitting: Internal Medicine

## 2014-01-16 ENCOUNTER — Other Ambulatory Visit: Payer: Self-pay | Admitting: Internal Medicine

## 2014-01-17 ENCOUNTER — Telehealth: Payer: Self-pay | Admitting: Internal Medicine

## 2014-01-17 ENCOUNTER — Ambulatory Visit: Payer: Medicare Other | Admitting: Internal Medicine

## 2014-01-17 NOTE — Telephone Encounter (Signed)
Pt notified of abdominal US results via my chart.  Abdominal US reveals fatty liver otherwise normal.

## 2014-01-19 ENCOUNTER — Other Ambulatory Visit (INDEPENDENT_AMBULATORY_CARE_PROVIDER_SITE_OTHER): Payer: Medicare Other

## 2014-01-19 DIAGNOSIS — I4891 Unspecified atrial fibrillation: Secondary | ICD-10-CM

## 2014-01-19 LAB — PROTIME-INR
INR: 4.3 ratio — ABNORMAL HIGH (ref 0.8–1.0)
Prothrombin Time: 44.5 s — ABNORMAL HIGH (ref 10.2–12.4)

## 2014-01-20 ENCOUNTER — Other Ambulatory Visit: Payer: Medicare Other

## 2014-01-20 DIAGNOSIS — I5031 Acute diastolic (congestive) heart failure: Secondary | ICD-10-CM

## 2014-01-20 DIAGNOSIS — I4891 Unspecified atrial fibrillation: Secondary | ICD-10-CM

## 2014-01-21 ENCOUNTER — Encounter: Payer: Self-pay | Admitting: *Deleted

## 2014-01-21 LAB — PROTIME-INR
INR: 2.3 — ABNORMAL HIGH (ref <1.50)
PROTHROMBIN TIME: 24.7 s — AB (ref 11.6–15.2)

## 2014-01-24 ENCOUNTER — Encounter: Payer: Self-pay | Admitting: Internal Medicine

## 2014-01-25 ENCOUNTER — Other Ambulatory Visit (INDEPENDENT_AMBULATORY_CARE_PROVIDER_SITE_OTHER): Payer: Medicare Other

## 2014-01-25 DIAGNOSIS — I4891 Unspecified atrial fibrillation: Secondary | ICD-10-CM

## 2014-01-25 LAB — PROTIME-INR
INR: 1.7 ratio — AB (ref 0.8–1.0)
Prothrombin Time: 17.3 s — ABNORMAL HIGH (ref 10.2–12.4)

## 2014-01-26 ENCOUNTER — Encounter: Payer: Self-pay | Admitting: *Deleted

## 2014-01-28 ENCOUNTER — Encounter: Payer: Self-pay | Admitting: Internal Medicine

## 2014-01-28 DIAGNOSIS — R7989 Other specified abnormal findings of blood chemistry: Secondary | ICD-10-CM | POA: Insufficient documentation

## 2014-01-28 DIAGNOSIS — M25579 Pain in unspecified ankle and joints of unspecified foot: Secondary | ICD-10-CM | POA: Insufficient documentation

## 2014-01-28 DIAGNOSIS — M25571 Pain in right ankle and joints of right foot: Secondary | ICD-10-CM | POA: Insufficient documentation

## 2014-01-28 DIAGNOSIS — R945 Abnormal results of liver function studies: Secondary | ICD-10-CM | POA: Insufficient documentation

## 2014-02-02 ENCOUNTER — Other Ambulatory Visit (INDEPENDENT_AMBULATORY_CARE_PROVIDER_SITE_OTHER): Payer: Medicare Other

## 2014-02-02 ENCOUNTER — Encounter: Payer: Self-pay | Admitting: Internal Medicine

## 2014-02-02 ENCOUNTER — Other Ambulatory Visit: Payer: Medicare Other

## 2014-02-02 DIAGNOSIS — Z7901 Long term (current) use of anticoagulants: Secondary | ICD-10-CM

## 2014-02-02 LAB — PROTIME-INR
INR: 3.5 ratio — ABNORMAL HIGH (ref 0.8–1.0)
PROTHROMBIN TIME: 35.8 s — AB (ref 10.2–12.4)

## 2014-02-07 ENCOUNTER — Other Ambulatory Visit (INDEPENDENT_AMBULATORY_CARE_PROVIDER_SITE_OTHER): Payer: Medicare Other

## 2014-02-07 DIAGNOSIS — Z7901 Long term (current) use of anticoagulants: Secondary | ICD-10-CM

## 2014-02-07 LAB — PROTIME-INR
INR: 2.8 ratio — ABNORMAL HIGH (ref 0.8–1.0)
Prothrombin Time: 29.1 s — ABNORMAL HIGH (ref 10.2–12.4)

## 2014-02-11 ENCOUNTER — Other Ambulatory Visit (INDEPENDENT_AMBULATORY_CARE_PROVIDER_SITE_OTHER): Payer: Medicare Other

## 2014-02-11 DIAGNOSIS — Z7901 Long term (current) use of anticoagulants: Secondary | ICD-10-CM

## 2014-02-11 LAB — PROTIME-INR
INR: 2.2 ratio — ABNORMAL HIGH (ref 0.8–1.0)
PROTHROMBIN TIME: 22.9 s — AB (ref 10.2–12.4)

## 2014-02-18 ENCOUNTER — Other Ambulatory Visit (INDEPENDENT_AMBULATORY_CARE_PROVIDER_SITE_OTHER): Payer: Medicare Other

## 2014-02-18 DIAGNOSIS — Z7901 Long term (current) use of anticoagulants: Secondary | ICD-10-CM

## 2014-02-18 LAB — PROTIME-INR
INR: 2.9 ratio — ABNORMAL HIGH (ref 0.8–1.0)
Prothrombin Time: 29.6 s — ABNORMAL HIGH (ref 10.2–12.4)

## 2014-02-21 ENCOUNTER — Encounter: Payer: Self-pay | Admitting: *Deleted

## 2014-02-22 ENCOUNTER — Ambulatory Visit (INDEPENDENT_AMBULATORY_CARE_PROVIDER_SITE_OTHER): Payer: Medicare Other | Admitting: Internal Medicine

## 2014-02-22 ENCOUNTER — Encounter: Payer: Self-pay | Admitting: Internal Medicine

## 2014-02-22 ENCOUNTER — Other Ambulatory Visit: Payer: Self-pay | Admitting: *Deleted

## 2014-02-22 VITALS — BP 134/76 | HR 81 | Temp 99.0°F | Resp 16 | Ht 68.5 in | Wt 243.5 lb

## 2014-02-22 DIAGNOSIS — D649 Anemia, unspecified: Secondary | ICD-10-CM

## 2014-02-22 DIAGNOSIS — R7989 Other specified abnormal findings of blood chemistry: Secondary | ICD-10-CM

## 2014-02-22 DIAGNOSIS — Z860100 Personal history of colon polyps, unspecified: Secondary | ICD-10-CM

## 2014-02-22 DIAGNOSIS — F329 Major depressive disorder, single episode, unspecified: Secondary | ICD-10-CM

## 2014-02-22 DIAGNOSIS — R945 Abnormal results of liver function studies: Secondary | ICD-10-CM

## 2014-02-22 DIAGNOSIS — L989 Disorder of the skin and subcutaneous tissue, unspecified: Secondary | ICD-10-CM

## 2014-02-22 DIAGNOSIS — M542 Cervicalgia: Secondary | ICD-10-CM

## 2014-02-22 DIAGNOSIS — G4733 Obstructive sleep apnea (adult) (pediatric): Secondary | ICD-10-CM

## 2014-02-22 DIAGNOSIS — E119 Type 2 diabetes mellitus without complications: Secondary | ICD-10-CM

## 2014-02-22 DIAGNOSIS — I776 Arteritis, unspecified: Secondary | ICD-10-CM

## 2014-02-22 DIAGNOSIS — E559 Vitamin D deficiency, unspecified: Secondary | ICD-10-CM

## 2014-02-22 DIAGNOSIS — I509 Heart failure, unspecified: Secondary | ICD-10-CM

## 2014-02-22 DIAGNOSIS — I7782 Antineutrophilic cytoplasmic antibody (ANCA) vasculitis: Secondary | ICD-10-CM

## 2014-02-22 DIAGNOSIS — F32A Depression, unspecified: Secondary | ICD-10-CM

## 2014-02-22 DIAGNOSIS — F3289 Other specified depressive episodes: Secondary | ICD-10-CM

## 2014-02-22 DIAGNOSIS — I7789 Other specified disorders of arteries and arterioles: Secondary | ICD-10-CM

## 2014-02-22 DIAGNOSIS — I4891 Unspecified atrial fibrillation: Secondary | ICD-10-CM

## 2014-02-22 DIAGNOSIS — K219 Gastro-esophageal reflux disease without esophagitis: Secondary | ICD-10-CM

## 2014-02-22 DIAGNOSIS — I110 Hypertensive heart disease with heart failure: Secondary | ICD-10-CM

## 2014-02-22 LAB — BASIC METABOLIC PANEL
BUN: 16 mg/dL (ref 6–23)
CO2: 31 mEq/L (ref 19–32)
Calcium: 9.3 mg/dL (ref 8.4–10.5)
Chloride: 96 mEq/L (ref 96–112)
Creatinine, Ser: 0.7 mg/dL (ref 0.4–1.2)
GFR: 85.82 mL/min (ref >60.00)
Glucose, Bld: 139 mg/dL — ABNORMAL HIGH (ref 70–99)
Potassium: 3.9 mEq/L (ref 3.5–5.1)
Sodium: 137 mEq/L (ref 135–145)

## 2014-02-22 LAB — CBC WITH DIFFERENTIAL/PLATELET
BASOS PCT: 0.3 % (ref 0.0–3.0)
Basophils Absolute: 0 10*3/uL (ref 0.0–0.1)
EOS ABS: 0.1 10*3/uL (ref 0.0–0.7)
Eosinophils Relative: 1.3 % (ref 0.0–5.0)
HCT: 36.1 % (ref 36.0–46.0)
HEMOGLOBIN: 11.6 g/dL — AB (ref 12.0–15.0)
LYMPHS ABS: 0.9 10*3/uL (ref 0.7–4.0)
Lymphocytes Relative: 11.7 % — ABNORMAL LOW (ref 12.0–46.0)
MCHC: 32 g/dL (ref 30.0–36.0)
MCV: 81.8 fl (ref 78.0–100.0)
MONO ABS: 0.6 10*3/uL (ref 0.1–1.0)
Monocytes Relative: 7.6 % (ref 3.0–12.0)
NEUTROS ABS: 6.1 10*3/uL (ref 1.4–7.7)
Neutrophils Relative %: 79.1 % — ABNORMAL HIGH (ref 43.0–77.0)
Platelets: 322 10*3/uL (ref 150.0–400.0)
RBC: 4.42 Mil/uL (ref 3.87–5.11)
RDW: 16.6 % — ABNORMAL HIGH (ref 11.5–14.6)
WBC: 7.8 10*3/uL (ref 4.5–10.5)

## 2014-02-22 LAB — HEMOGLOBIN A1C: Hgb A1c MFr Bld: 6 % (ref 4.6–6.5)

## 2014-02-22 LAB — HEPATIC FUNCTION PANEL
ALT: 29 U/L (ref 0–35)
AST: 34 U/L (ref 0–37)
Albumin: 3.8 g/dL (ref 3.5–5.2)
Alkaline Phosphatase: 59 U/L (ref 39–117)
Bilirubin, Direct: 0.1 mg/dL (ref 0.0–0.3)
TOTAL PROTEIN: 6.7 g/dL (ref 6.0–8.3)
Total Bilirubin: 0.4 mg/dL (ref 0.3–1.2)

## 2014-02-22 LAB — PROTIME-INR
INR: 3.1 ratio — ABNORMAL HIGH (ref 0.8–1.0)
Prothrombin Time: 32.2 s — ABNORMAL HIGH (ref 10.2–12.4)

## 2014-02-22 LAB — IBC PANEL
Iron: 37 ug/dL — ABNORMAL LOW (ref 42–145)
Saturation Ratios: 8.3 % — ABNORMAL LOW (ref 20.0–50.0)
TRANSFERRIN: 317 mg/dL (ref 212.0–360.0)

## 2014-02-22 LAB — VITAMIN B12: Vitamin B-12: 289 pg/mL (ref 211–911)

## 2014-02-22 LAB — FERRITIN: Ferritin: 20.1 ng/mL (ref 10.0–291.0)

## 2014-02-22 MED ORDER — HYDROCODONE-ACETAMINOPHEN 5-325 MG PO TABS: 1.0000 | ORAL_TABLET | Freq: Two times a day (BID) | ORAL | Status: DC | PRN

## 2014-02-22 NOTE — Telephone Encounter (Signed)
Pt forgot to ask at appointment for refill of her hydrocodone. Ok?

## 2014-02-22 NOTE — Telephone Encounter (Signed)
Refilled hydrocodone #40 with no refills.  rx signed and in Toya's box.

## 2014-02-22 NOTE — Telephone Encounter (Signed)
Sent mychart message, Rx placed up front for pickup

## 2014-02-22 NOTE — Progress Notes (Signed)
Pre visit review using our clinic review tool, if applicable. No additional management support is needed unless otherwise documented below in the visit note. 

## 2014-02-23 ENCOUNTER — Encounter: Payer: Self-pay | Admitting: *Deleted

## 2014-02-23 LAB — VITAMIN D 25 HYDROXY (VIT D DEFICIENCY, FRACTURES): Vit D, 25-Hydroxy: 59 ng/mL (ref 30–89)

## 2014-02-25 ENCOUNTER — Encounter: Payer: Self-pay | Admitting: *Deleted

## 2014-02-25 ENCOUNTER — Other Ambulatory Visit: Payer: Medicare Other

## 2014-02-25 ENCOUNTER — Other Ambulatory Visit (INDEPENDENT_AMBULATORY_CARE_PROVIDER_SITE_OTHER): Payer: Medicare Other

## 2014-02-25 DIAGNOSIS — I4891 Unspecified atrial fibrillation: Secondary | ICD-10-CM

## 2014-02-25 LAB — PROTIME-INR
INR: 3.2 ratio — ABNORMAL HIGH (ref 0.8–1.0)
Prothrombin Time: 33.2 s — ABNORMAL HIGH (ref 10.2–12.4)

## 2014-02-26 ENCOUNTER — Encounter: Payer: Self-pay | Admitting: Internal Medicine

## 2014-02-26 DIAGNOSIS — L989 Disorder of the skin and subcutaneous tissue, unspecified: Secondary | ICD-10-CM | POA: Insufficient documentation

## 2014-02-26 NOTE — Assessment & Plan Note (Signed)
Uses CPAP.  Follow.

## 2014-02-26 NOTE — Assessment & Plan Note (Signed)
Controlled on current medication regimen.  Follow.  Avoid antiinflammatories.

## 2014-02-26 NOTE — Progress Notes (Signed)
Subjective:    Patient ID: Betty [name], female    DOB: 29-May-1946, 68 y.o.   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  Is followed by Dr Rosalita Chessman (rheumatology - [facility]).  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  She spent 4.5-5 months in rehab.  She is able to walk and works part time.  She also has a history of atrial fib and is s/p two ablations.  On coumadin.  Has a history of DVT as well.  She also has a history of hypertension, diabetes, urinary incontinence and depression.  She comes in today for a scheduled follow up.   No chest pain or tightness.  No increased heart rate or palpitations.  No bowel change.  Breathing stable.  Recently evaluated by Dr Candee Furbish.  Started on azathioprine.  Tolerating. She does report some increased aching - notices mostly from the waist down.  Takes hydrocodone.  Planning to see orthopedics within the next week.  Some persistent neck pain and stiffness.   Referred by Dr Rosalita Chessman.  She also reports a persistent nasal lesion.      Past Medical History  Diagnosis Date  . Headache(784.0)   . Backache, unspecified   . Herpes zoster without mention of complication   . Osteoarthrosis, unspecified whether generalized or localized, unspecified site   . Nontoxic uninodular goiter   . Obesity, unspecified   . Esophageal reflux   . Unspecified sleep apnea   . Atrial fibrillation   . Acute diastolic heart failure   . Cardiomegaly   . Hypertension     heart controlled w CHF  . Asthma   . Diabetes mellitus without complication   . Allergy   . Hx: UTI (urinary tract infection)   . Urine incontinence     hx of  . ANCA-associated vasculitis     Outpatient Encounter Prescriptions as of 02/22/2014  Medication Sig  . azaTHIOprine (IMURAN) 50 MG tablet Take 100 mg by mouth daily.  Marland Kitchen buPROPion (WELLBUTRIN SR) 200 MG 12 hr tablet Take 200 mg by mouth daily.  . calcium-vitamin D  (OSCAL WITH D) 500-200 MG-UNIT per tablet Take 1 tablet by mouth daily.  Marland Kitchen diltiazem (TIAZAC) 240 MG 24 hr capsule Take 240 mg by mouth daily.  . furosemide (LASIX) 40 MG tablet Take 40 mg by mouth daily.  Marland Kitchen gabapentin (NEURONTIN) 600 MG tablet Take 1 tablet (600 mg total) by mouth 2 (two) times daily.  Marland Kitchen glipiZIDE (GLUCOTROL) 10 MG tablet Take 10 mg by mouth daily.  Marland Kitchen losartan (COZAAR) 50 MG tablet TAKE 1 TABLET BY MOUTH DAILY  . metFORMIN (GLUCOPHAGE) 1000 MG tablet Take 1,000 mg by mouth daily.  Marland Kitchen omeprazole (PRILOSEC) 40 MG capsule TAKE ONE CAPSULE AT BEDTIME  . traZODone (DESYREL) 100 MG tablet Take 100 mg by mouth 2 (two) times daily.  Marland Kitchen triamterene-hydrochlorothiazide (DYAZIDE) 37.5-25 MG per capsule Take 1 capsule by mouth daily.  Marland Kitchen warfarin (COUMADIN) 1 MG tablet TAKE AS DIRECTED  . warfarin (COUMADIN) 4 MG tablet Take 4 mg by mouth daily.  . [DISCONTINUED] HYDROcodone-acetaminophen (NORCO) 5-325 MG per tablet Take 1 tablet by mouth 2 (two) times daily as needed.  . pramoxine-hydrocortisone (ANALPRAM-HC) 1-1 % rectal cream Place 1 application rectally 2 (two) times daily.  . [DISCONTINUED] cephALEXin (KEFLEX) 500 MG capsule Take 1 capsule (500 mg total) by mouth 3 (three) times daily.  . [  DISCONTINUED] diltiazem (CARDIZEM CD) 240 MG 24 hr capsule TAKE ONE CAPSULE BY MOUTH DAILY    Review of Systems Patient denies any headache, lightheadedness or dizziness.  No significant sinus or allergy symptoms currently.  No chest pain, tightness or palpitations.  No increased shortness of breath, cough or congestion.  No acid reflux.  Controlled.  No nausea or vomiting.  No abdominal pain or cramping.  No bowel change, such as diarrhea, constipation, BRBPR or melana.   Also has sleep apnea.  Uses CPAP.  States she is up to date with her colonoscopy.  Last 2013.  Has a history of colon polyps.   Sees rheumatology regularly.   Just started on imuran.       Objective:   Physical Exam  Filed  Vitals:   02/22/14 0907  BP: 134/76  Pulse: 81  Temp: 99 F (37.2 C)  Resp: 16   Blood pressure recheck:  17/71  68 year old female in no acute distress.   HEENT:  Nares- clear.  Oropharynx - without lesions. NECK:  Supple.  Nontender.  No audible bruit.  HEART:  Appears to be regular. LUNGS:  No crackles or wheezing audible.  Respirations even and unlabored.  RADIAL PULSE:  Equal bilaterally.  ABDOMEN:  Soft, nontender.  Bowel sounds present and normal.  No audible abdominal bruit. EXTREMITIES:  No increased edema present.  DP pulses palpable and equal bilaterally.      FEET:  No lesions.      Assessment & Plan:  MSK:  Increased joint aching.  Sees Dr Rosalita Chessman.  Referred to ortho.  Due to see ortho this next week.  With the neck pain, check C-spine xray.    HEALTH MAINTENANCE.  Physical 08/27/13.    Has had a hysterectomy, but had suspicious cells with her paps.  Pelvic/pap at physical negative with negative HPV.    States up to date with colonoscopy.  Last 2013.    Information given for her to schedule her mammogram.  05/26/12 - normal bone density.    I spent 40 minutes with the patient and more than 50% of the time was spent in consultation regarding the above.

## 2014-02-26 NOTE — Assessment & Plan Note (Signed)
Per pt, stable.  Followed by Dr Rosalita Chessman (rheumatology).   On imuran now.  Counts being followed by Dr Rosalita Chessman.

## 2014-02-26 NOTE — Assessment & Plan Note (Signed)
On wellbutrin.  Off citalopram.  Feels she is coping relatively well.  Does not feel she needs anything more at this point.  Follow.

## 2014-02-26 NOTE — Assessment & Plan Note (Signed)
Sugars as outlined.  Follow metabolic panel and [id number].

## 2014-02-26 NOTE — Assessment & Plan Note (Signed)
Blood pressure controlled.  Will continue same medication regimen.  Follow.

## 2014-02-26 NOTE — Assessment & Plan Note (Signed)
Persistent nasal lesion.  Refer to dermatology.

## 2014-02-26 NOTE — Assessment & Plan Note (Signed)
Recheck liver panel with next labs.  Refer to GI for further evaluation and w/up of her fatty liver.

## 2014-02-26 NOTE — Assessment & Plan Note (Addendum)
Colonoscopy as outlined.  Follow.  States due a f/u with GI.  Schedule an appt.

## 2014-02-26 NOTE — Assessment & Plan Note (Signed)
On coumadin.  Follow pt/inr.   Rate controlled.  Avoid antiinflammatories.

## 2014-03-04 ENCOUNTER — Other Ambulatory Visit (INDEPENDENT_AMBULATORY_CARE_PROVIDER_SITE_OTHER): Payer: Medicare Other

## 2014-03-04 ENCOUNTER — Other Ambulatory Visit: Payer: Self-pay | Admitting: *Deleted

## 2014-03-04 ENCOUNTER — Encounter: Payer: Self-pay | Admitting: *Deleted

## 2014-03-04 DIAGNOSIS — I4891 Unspecified atrial fibrillation: Secondary | ICD-10-CM

## 2014-03-04 LAB — PROTIME-INR
INR: 2.1 ratio — ABNORMAL HIGH (ref 0.8–1.0)
PROTHROMBIN TIME: 21.4 s — AB (ref 10.2–12.4)

## 2014-03-07 ENCOUNTER — Ambulatory Visit: Payer: Self-pay | Admitting: Internal Medicine

## 2014-03-07 ENCOUNTER — Other Ambulatory Visit: Payer: Medicare Other

## 2014-03-07 ENCOUNTER — Other Ambulatory Visit: Payer: Self-pay | Admitting: Internal Medicine

## 2014-03-07 DIAGNOSIS — I4891 Unspecified atrial fibrillation: Secondary | ICD-10-CM

## 2014-03-07 LAB — HM MAMMOGRAPHY: HM Mammogram: NEGATIVE

## 2014-03-07 NOTE — Telephone Encounter (Signed)
Received refill request for bupropion, directions differed from med list. Pt states she take 200 mg bid, originally Rx'd by Dr. Joella Prince from [facility], previous PCP. Ok to refill?

## 2014-03-07 NOTE — Addendum Note (Signed)
Addended by: Karlene Einstein D on: 03/07/2014 03:11 PM   Modules accepted: Orders

## 2014-03-07 NOTE — Telephone Encounter (Signed)
Pt came in for labs today and stated she was out of her  Diltiazem and gabapentin

## 2014-03-07 NOTE — Telephone Encounter (Signed)
If she has been taking bid regularly, then ok to refill bid #60 with 2 refills.

## 2014-03-07 NOTE — Telephone Encounter (Signed)
Sent mychart message for clarification.

## 2014-03-08 NOTE — Telephone Encounter (Signed)
Refilled wellbutrin 200mg  bid #60 with 2 refills.

## 2014-03-14 ENCOUNTER — Other Ambulatory Visit (INDEPENDENT_AMBULATORY_CARE_PROVIDER_SITE_OTHER): Payer: Medicare Other

## 2014-03-14 DIAGNOSIS — I4891 Unspecified atrial fibrillation: Secondary | ICD-10-CM

## 2014-03-14 LAB — PROTIME-INR
INR: 2.5 ratio — AB (ref 0.8–1.0)
Prothrombin Time: 25.8 s — ABNORMAL HIGH (ref 10.2–12.4)

## 2014-03-15 ENCOUNTER — Encounter: Payer: Self-pay | Admitting: Internal Medicine

## 2014-03-15 ENCOUNTER — Encounter: Payer: Self-pay | Admitting: *Deleted

## 2014-03-17 ENCOUNTER — Encounter: Payer: Self-pay | Admitting: Internal Medicine

## 2014-03-21 ENCOUNTER — Other Ambulatory Visit (INDEPENDENT_AMBULATORY_CARE_PROVIDER_SITE_OTHER): Payer: Medicare Other

## 2014-03-21 DIAGNOSIS — I4891 Unspecified atrial fibrillation: Secondary | ICD-10-CM

## 2014-03-21 LAB — PROTIME-INR
INR: 2.3 ratio — ABNORMAL HIGH (ref 0.8–1.0)
Prothrombin Time: 24.4 s — ABNORMAL HIGH (ref 10.2–12.4)

## 2014-03-22 ENCOUNTER — Encounter: Payer: Self-pay | Admitting: Internal Medicine

## 2014-03-22 ENCOUNTER — Encounter: Payer: Self-pay | Admitting: *Deleted

## 2014-04-01 ENCOUNTER — Telehealth: Payer: Self-pay | Admitting: *Deleted

## 2014-04-01 DIAGNOSIS — Z7901 Long term (current) use of anticoagulants: Secondary | ICD-10-CM

## 2014-04-01 NOTE — Telephone Encounter (Signed)
Pt coming in on Monday what labs and dx? Is it just for a pt/inr ?

## 2014-04-02 NOTE — Telephone Encounter (Signed)
Pt/inr ordered

## 2014-04-04 ENCOUNTER — Other Ambulatory Visit (INDEPENDENT_AMBULATORY_CARE_PROVIDER_SITE_OTHER): Payer: Medicare Other

## 2014-04-04 ENCOUNTER — Telehealth: Payer: Self-pay | Admitting: Internal Medicine

## 2014-04-04 ENCOUNTER — Other Ambulatory Visit: Payer: Self-pay | Admitting: *Deleted

## 2014-04-04 DIAGNOSIS — Z5181 Encounter for therapeutic drug level monitoring: Secondary | ICD-10-CM

## 2014-04-04 DIAGNOSIS — Z7901 Long term (current) use of anticoagulants: Secondary | ICD-10-CM

## 2014-04-04 LAB — PROTIME-INR
INR: 2 ratio — ABNORMAL HIGH (ref 0.8–1.0)
Prothrombin Time: 20.8 s — ABNORMAL HIGH (ref 10.2–12.4)

## 2014-04-04 MED ORDER — LOSARTAN POTASSIUM 50 MG PO TABS: ORAL_TABLET | ORAL | Status: DC

## 2014-04-04 MED ORDER — HYDROCODONE-ACETAMINOPHEN 5-325 MG PO TABS: 1.0000 | ORAL_TABLET | Freq: Every day | ORAL | Status: DC | PRN

## 2014-04-04 MED ORDER — TRIAMTERENE-HCTZ 37.5-25 MG PO CAPS: 1.0000 | ORAL_CAPSULE | Freq: Every day | ORAL | Status: DC

## 2014-04-04 NOTE — Telephone Encounter (Signed)
Electronic Rx request, Please advise. 

## 2014-04-04 NOTE — Telephone Encounter (Signed)
Okay to refill? 

## 2014-04-04 NOTE — Telephone Encounter (Signed)
Refilled triam/hctz #30 with 2 refills.

## 2014-04-04 NOTE — Telephone Encounter (Signed)
States she is trying to get a refill on triamterene.  States pharmacy is telling her she has no more refills.  States this was previously prescribed by a different doctor but Dr. Nicki Reaper is aware she has been on the medication.  [facility] [address].

## 2014-04-04 NOTE — Telephone Encounter (Signed)
Refilled hydrocodone #30 with no refills.

## 2014-04-05 ENCOUNTER — Encounter: Payer: Self-pay | Admitting: *Deleted

## 2014-04-25 ENCOUNTER — Other Ambulatory Visit: Payer: Medicare Other

## 2014-04-26 ENCOUNTER — Other Ambulatory Visit (INDEPENDENT_AMBULATORY_CARE_PROVIDER_SITE_OTHER): Payer: Medicare Other

## 2014-04-26 DIAGNOSIS — Z7901 Long term (current) use of anticoagulants: Secondary | ICD-10-CM

## 2014-04-26 LAB — PROTIME-INR
INR: 2 ratio — AB (ref 0.8–1.0)
PROTHROMBIN TIME: 22.3 s — AB (ref 9.6–13.1)

## 2014-04-27 ENCOUNTER — Other Ambulatory Visit: Payer: Self-pay | Admitting: *Deleted

## 2014-04-27 MED ORDER — FUROSEMIDE 40 MG PO TABS: 40.0000 mg | ORAL_TABLET | Freq: Every day | ORAL | Status: DC

## 2014-04-27 MED ORDER — WARFARIN SODIUM 5 MG PO TABS: 5.0000 mg | ORAL_TABLET | Freq: Every day | ORAL | Status: DC

## 2014-05-03 ENCOUNTER — Encounter: Payer: Self-pay | Admitting: Internal Medicine

## 2014-05-03 ENCOUNTER — Ambulatory Visit (INDEPENDENT_AMBULATORY_CARE_PROVIDER_SITE_OTHER): Payer: Medicare Other | Admitting: Internal Medicine

## 2014-05-03 VITALS — BP 130/70 | HR 90 | Temp 98.8°F | Ht 68.5 in | Wt 236.2 lb

## 2014-05-03 DIAGNOSIS — F3289 Other specified depressive episodes: Secondary | ICD-10-CM

## 2014-05-03 DIAGNOSIS — I4891 Unspecified atrial fibrillation: Secondary | ICD-10-CM

## 2014-05-03 DIAGNOSIS — I7789 Other specified disorders of arteries and arterioles: Secondary | ICD-10-CM

## 2014-05-03 DIAGNOSIS — E559 Vitamin D deficiency, unspecified: Secondary | ICD-10-CM

## 2014-05-03 DIAGNOSIS — Z860100 Personal history of colon polyps, unspecified: Secondary | ICD-10-CM

## 2014-05-03 DIAGNOSIS — M25579 Pain in unspecified ankle and joints of unspecified foot: Secondary | ICD-10-CM

## 2014-05-03 DIAGNOSIS — M25571 Pain in right ankle and joints of right foot: Secondary | ICD-10-CM

## 2014-05-03 DIAGNOSIS — I110 Hypertensive heart disease with heart failure: Secondary | ICD-10-CM

## 2014-05-03 DIAGNOSIS — R350 Frequency of micturition: Secondary | ICD-10-CM

## 2014-05-03 DIAGNOSIS — I509 Heart failure, unspecified: Secondary | ICD-10-CM

## 2014-05-03 DIAGNOSIS — K219 Gastro-esophageal reflux disease without esophagitis: Secondary | ICD-10-CM

## 2014-05-03 DIAGNOSIS — E119 Type 2 diabetes mellitus without complications: Secondary | ICD-10-CM

## 2014-05-03 DIAGNOSIS — I7782 Antineutrophilic cytoplasmic antibody (ANCA) vasculitis: Secondary | ICD-10-CM

## 2014-05-03 DIAGNOSIS — G4733 Obstructive sleep apnea (adult) (pediatric): Secondary | ICD-10-CM

## 2014-05-03 DIAGNOSIS — R945 Abnormal results of liver function studies: Secondary | ICD-10-CM

## 2014-05-03 DIAGNOSIS — R7989 Other specified abnormal findings of blood chemistry: Secondary | ICD-10-CM

## 2014-05-03 DIAGNOSIS — F329 Major depressive disorder, single episode, unspecified: Secondary | ICD-10-CM

## 2014-05-03 DIAGNOSIS — F32A Depression, unspecified: Secondary | ICD-10-CM

## 2014-05-03 DIAGNOSIS — I776 Arteritis, unspecified: Secondary | ICD-10-CM

## 2014-05-03 LAB — URINALYSIS, ROUTINE W REFLEX MICROSCOPIC
Bilirubin Urine: NEGATIVE
Ketones, ur: NEGATIVE
Leukocytes, UA: NEGATIVE
Nitrite: NEGATIVE
SPECIFIC GRAVITY, URINE: 1.01 (ref 1.000–1.030)
Total Protein, Urine: NEGATIVE
UROBILINOGEN UA: 0.2 (ref 0.0–1.0)
Urine Glucose: NEGATIVE
pH: 6.5 (ref 5.0–8.0)

## 2014-05-03 MED ORDER — SERTRALINE HCL 50 MG PO TABS: ORAL_TABLET | ORAL | Status: DC

## 2014-05-03 NOTE — Progress Notes (Signed)
Pre visit review using our clinic review tool, if applicable. No additional management support is needed unless otherwise documented below in the visit note. 

## 2014-05-03 NOTE — Patient Instructions (Signed)
Decrease wellbutrin to one tablet per day (do in the evening).  Start zoloft 1/2 tablet every morning

## 2014-05-05 LAB — CULTURE, URINE COMPREHENSIVE

## 2014-05-06 ENCOUNTER — Encounter: Payer: Self-pay | Admitting: *Deleted

## 2014-05-06 ENCOUNTER — Other Ambulatory Visit: Payer: Self-pay | Admitting: *Deleted

## 2014-05-06 MED ORDER — CEPHALEXIN 500 MG PO CAPS: 500.0000 mg | ORAL_CAPSULE | Freq: Three times a day (TID) | ORAL | Status: DC

## 2014-05-09 ENCOUNTER — Encounter: Payer: Self-pay | Admitting: Internal Medicine

## 2014-05-09 NOTE — Assessment & Plan Note (Signed)
Stable

## 2014-05-09 NOTE — Assessment & Plan Note (Signed)
Sugars as outlined.  Follow metabolic panel and [id number].

## 2014-05-09 NOTE — Assessment & Plan Note (Signed)
Colonoscopy as outlined.  Due f/u colonoscopy.  Is scheduled to f/u with Dr Tiffany Kocher.

## 2014-05-09 NOTE — Progress Notes (Signed)
Subjective:    Patient ID: Betty [name], female    DOB: [birth year], 68 y.o.   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  Is followed by Dr Rosalita Chessman (rheumatology - [facility]).  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  She spent 4.5-5 months in rehab.  She is able to walk and works part time.  She also has a history of atrial fib and is s/p two ablations.  On coumadin.  Has a history of DVT as well.  She also has a history of hypertension, diabetes, urinary incontinence and depression.  She comes in today for a scheduled follow up.   No chest pain or tightness.  No increased heart rate or palpitations.  No bowel change.  Breathing stable.  Recently evaluated by Dr Candee Furbish.  Started on azathioprine.  Tolerating.  She was having increased aching - noticed mostly from the waist down.  Takes hydrocodone.  Saw ortho.  States was told she had a growth on her foot.  Referred to oncology.  States checked out fine.  Recommended a f/u appt in 6 months to reevaluate.  (saw Dr Abbie Sons [phone]).   She also reports increased urinary frequency and some discomfort.  Has noticed for the last two weeks.     Past Medical History  Diagnosis Date  . Headache(784.0)   . Backache, unspecified   . Herpes zoster without mention of complication   . Osteoarthrosis, unspecified whether generalized or localized, unspecified site   . Nontoxic uninodular goiter   . Obesity, unspecified   . Esophageal reflux   . Unspecified sleep apnea   . Atrial fibrillation   . Acute diastolic heart failure   . Cardiomegaly   . Hypertension     heart controlled w CHF  . Asthma   . Diabetes mellitus without complication   . Allergy   . Hx: UTI (urinary tract infection)   . Urine incontinence     hx of  . ANCA-associated vasculitis     Outpatient Encounter Prescriptions as of 05/03/2014  Medication Sig  . azaTHIOprine  (IMURAN) 50 MG tablet Take 100 mg by mouth daily.  Marland Kitchen buPROPion (WELLBUTRIN SR) 200 MG 12 hr tablet TAKE 1 TABLET BY MOUTH TWICE A DAY  . calcium-vitamin D (OSCAL WITH D) 500-200 MG-UNIT per tablet Take 1 tablet by mouth daily.  Marland Kitchen diltiazem (TIAZAC) 240 MG 24 hr capsule Take 240 mg by mouth daily.  . furosemide (LASIX) 40 MG tablet Take 1 tablet (40 mg total) by mouth daily.  Marland Kitchen gabapentin (NEURONTIN) 600 MG tablet Take 1 tablet (600 mg total) by mouth 2 (two) times daily.  Marland Kitchen glipiZIDE (GLUCOTROL) 10 MG tablet Take 10 mg by mouth daily.  Marland Kitchen HYDROcodone-acetaminophen (NORCO) 5-325 MG per tablet Take 1 tablet by mouth daily as needed.  Marland Kitchen losartan (COZAAR) 50 MG tablet TAKE 1 TABLET BY MOUTH DAILY  . metFORMIN (GLUCOPHAGE) 1000 MG tablet Take 1,000 mg by mouth daily.  Marland Kitchen omeprazole (PRILOSEC) 40 MG capsule TAKE ONE CAPSULE AT BEDTIME  . pramoxine-hydrocortisone (ANALPRAM-HC) 1-1 % rectal cream Place 1 application rectally 2 (two) times daily.  . traZODone (DESYREL) 100 MG tablet Take 100 mg by mouth 2 (two) times daily.  Marland Kitchen triamterene-hydrochlorothiazide (DYAZIDE) 37.5-25 MG per capsule Take 1 each (1 capsule total) by mouth daily.  Marland Kitchen warfarin (COUMADIN) 1 MG tablet TAKE AS DIRECTED  .  warfarin (COUMADIN) 5 MG tablet Take 1 tablet (5 mg total) by mouth daily. Take as directed  . sertraline (ZOLOFT) 50 MG tablet 1/2 tablet x 2 weeks and then increase to one whole tablet per day - after letting me know how doing.  Will taper off wellbutrin    Review of Systems Patient denies any headache, lightheadedness or dizziness.  No significant sinus or allergy symptoms currently.  No chest pain, tightness or palpitations.  No increased shortness of breath, cough or congestion.  No acid reflux.  Controlled.  No nausea or vomiting.  No abdominal pain or cramping.  No bowel change, such as diarrhea, constipation, BRBPR or melana.   Also has sleep apnea.  Uses CPAP.  States she is up to date with her colonoscopy.  Last  2013.  Has a history of colon polyps.   Sees rheumatology regularly.   On Imuran.  Urinary frequency and dysuria as outlined.        Objective:   Physical Exam  Filed Vitals:   05/03/14 0901  BP: 130/70  Pulse: 90  Temp: 98.8 F (37.1 C)   Blood pressure recheck:  54/16  68 year old female in no acute distress.   HEENT:  Nares- clear.  Oropharynx - without lesions. NECK:  Supple.  Nontender.  No audible bruit.  HEART:  Appears to be regular. LUNGS:  No crackles or wheezing audible.  Respirations even and unlabored.  RADIAL PULSE:  Equal bilaterally.  ABDOMEN:  Soft, nontender.  Bowel sounds present and normal.  No audible abdominal bruit. EXTREMITIES:  No increased edema present.      FEET:  No lesions.      Assessment & Plan:  MSK:  Increased joint aching.  Sees Dr Rosalita Chessman.  Referred to ortho.  Saw ortho.  Found a growth on her foot.  Saw oncology.  States everything checked out fine.  Recommended f/u in 6 months.     HEALTH MAINTENANCE.  Physical 08/27/13.    Has had a hysterectomy, but had suspicious cells with her paps.  Pelvic/pap at physical negative with negative HPV.    States up to date with colonoscopy.  Last 2013.  Mammogram 03/07/14 - [facility].   I spent 25 minutes with the patient and more than 50% of the time was spent in consultation regarding the above.

## 2014-05-09 NOTE — Assessment & Plan Note (Signed)
Follow vitamin D level.  

## 2014-05-09 NOTE — Assessment & Plan Note (Signed)
Check urinalysis and culture to confirm no infection.    

## 2014-05-09 NOTE — Assessment & Plan Note (Signed)
Recheck liver panel with next labs.  Referred to GI for further evaluation and w/up of her fatty liver.

## 2014-05-09 NOTE — Assessment & Plan Note (Signed)
Blood pressure controlled.  Will continue same medication regimen.  Follow.

## 2014-05-09 NOTE — Assessment & Plan Note (Addendum)
On wellbutrin.  Off citalopram.  Some increased depression.  No suicidal thoughts.  Does not feel wellbutrin is helping.  Will taper the wellbutrin and start zoloft 25mg  q day.  Will increase zoloft as tolerated.  Follow.  Call with update within the next 2-3 weeks.  Get her back in soon to reassess.

## 2014-05-09 NOTE — Assessment & Plan Note (Signed)
Controlled on current medication regimen.  Follow.  Avoid antiinflammatories.

## 2014-05-09 NOTE — Assessment & Plan Note (Signed)
Per pt, stable.  Followed by Dr Rosalita Chessman (rheumatology).   On imuran now.  Counts being followed by Dr Rosalita Chessman.

## 2014-05-09 NOTE — Assessment & Plan Note (Signed)
Uses CPAP.  Follow.

## 2014-05-09 NOTE — Assessment & Plan Note (Signed)
On coumadin.  Follow pt/inr.   Rate controlled.  Avoid antiinflammatories.

## 2014-05-10 ENCOUNTER — Other Ambulatory Visit: Payer: Self-pay | Admitting: *Deleted

## 2014-05-10 ENCOUNTER — Encounter: Payer: Self-pay | Admitting: *Deleted

## 2014-05-10 MED ORDER — WARFARIN SODIUM 1 MG PO TABS: ORAL_TABLET | ORAL | Status: DC

## 2014-05-10 MED ORDER — WARFARIN SODIUM 4 MG PO TABS: 4.0000 mg | ORAL_TABLET | Freq: Every day | ORAL | Status: DC

## 2014-05-10 NOTE — Telephone Encounter (Signed)
Pt called to report that she has somehow misplaced her 90 day supply of her Coumadin 5mg . She can not find it anywhere in home or car. I contacted pharmacy & they confirmed that Rx was picked up on 5/10. After discussing with Dr. Nicki Reaper, a new Rx for 4mg  & 1mg  was sent to pharmacy for a 90 day supply. Pt & pharmacy notified & sent pt a mychart message to remind her of directions.

## 2014-05-11 ENCOUNTER — Ambulatory Visit: Payer: Self-pay | Admitting: Unknown Physician Specialty

## 2014-05-11 LAB — HM COLONOSCOPY

## 2014-05-12 NOTE — Telephone Encounter (Signed)
Unread mychart message mailed to patient. Pt was also given verbal instructions over the phone on 05/11/14.

## 2014-05-13 LAB — PATHOLOGY REPORT

## 2014-05-20 ENCOUNTER — Other Ambulatory Visit: Payer: Self-pay | Admitting: *Deleted

## 2014-05-20 ENCOUNTER — Telehealth: Payer: Self-pay | Admitting: Internal Medicine

## 2014-05-20 MED ORDER — FLUCONAZOLE 150 MG PO TABS: 150.0000 mg | ORAL_TABLET | Freq: Once | ORAL | Status: DC

## 2014-05-20 NOTE — Telephone Encounter (Signed)
Fine to call in Diflucan 150mg  po x 1. If no improvement after this, she will need to be seen.

## 2014-05-20 NOTE — Telephone Encounter (Signed)
Okay to send in something? Or do I need to send this to Dr. Nicki Reaper? Please Advise

## 2014-05-20 NOTE — Telephone Encounter (Signed)
The patient thinks she has a yeast infection she is burning and itching after a antibiotic that was prescribed by Dr. Nicki Reaper for a UTI. Please advise.

## 2014-05-20 NOTE — Telephone Encounter (Signed)
Sent in one dose of Diflucan & notified pt via voicemail of info mentioned below

## 2014-05-22 ENCOUNTER — Other Ambulatory Visit: Payer: Self-pay | Admitting: Internal Medicine

## 2014-05-23 ENCOUNTER — Encounter: Payer: Self-pay | Admitting: Internal Medicine

## 2014-05-24 ENCOUNTER — Telehealth: Payer: Self-pay | Admitting: *Deleted

## 2014-05-24 ENCOUNTER — Encounter: Payer: Self-pay | Admitting: Internal Medicine

## 2014-05-24 ENCOUNTER — Other Ambulatory Visit (INDEPENDENT_AMBULATORY_CARE_PROVIDER_SITE_OTHER): Payer: Medicare Other

## 2014-05-24 DIAGNOSIS — Z7901 Long term (current) use of anticoagulants: Secondary | ICD-10-CM

## 2014-05-24 DIAGNOSIS — Z86010 Personal history of colonic polyps: Secondary | ICD-10-CM

## 2014-05-24 LAB — PROTIME-INR
INR: 2.8 ratio — ABNORMAL HIGH (ref 0.8–1.0)
Prothrombin Time: 30.6 s — ABNORMAL HIGH (ref 9.6–13.1)

## 2014-05-24 NOTE — Telephone Encounter (Signed)
Pt.notified

## 2014-05-24 NOTE — Telephone Encounter (Signed)
If off the wellbutrin and only taking 1/2 of zoloft 50mg  tablet then increase to 50mg  - one whole tablet per day.  Can schedule f/u if needed.

## 2014-05-24 NOTE — Telephone Encounter (Signed)
Was taking Wellbutrin BID-was told to change to one at supper & one 1/2 of the Zoloft in the morning. She has ran out of the Wellbutrin & only taking half the Zoloft. She c/o not having any energy (fatigue) or no interest in doing anthing or going anywhere. Wants to know what she should do. Please advise.

## 2014-05-25 ENCOUNTER — Encounter: Payer: Self-pay | Admitting: *Deleted

## 2014-05-25 ENCOUNTER — Encounter: Payer: Self-pay | Admitting: Internal Medicine

## 2014-05-30 ENCOUNTER — Other Ambulatory Visit: Payer: Medicare Other

## 2014-06-01 ENCOUNTER — Other Ambulatory Visit (INDEPENDENT_AMBULATORY_CARE_PROVIDER_SITE_OTHER): Payer: Medicare Other

## 2014-06-01 ENCOUNTER — Other Ambulatory Visit: Payer: Self-pay | Admitting: Internal Medicine

## 2014-06-01 DIAGNOSIS — Z5181 Encounter for therapeutic drug level monitoring: Secondary | ICD-10-CM

## 2014-06-01 LAB — PROTIME-INR
INR: 1.9 ratio — ABNORMAL HIGH (ref 0.8–1.0)
Prothrombin Time: 20.4 s — ABNORMAL HIGH (ref 9.6–13.1)

## 2014-06-01 MED ORDER — HYDROCODONE-ACETAMINOPHEN 5-325 MG PO TABS: 1.0000 | ORAL_TABLET | Freq: Every day | ORAL | Status: DC | PRN

## 2014-06-01 NOTE — Telephone Encounter (Signed)
Ok to refill hydrocodone #30 with no refills.  rx printed.

## 2014-06-02 ENCOUNTER — Other Ambulatory Visit: Payer: Self-pay | Admitting: Internal Medicine

## 2014-06-02 ENCOUNTER — Encounter: Payer: Self-pay | Admitting: *Deleted

## 2014-06-02 DIAGNOSIS — Z7901 Long term (current) use of anticoagulants: Secondary | ICD-10-CM

## 2014-06-02 NOTE — Progress Notes (Signed)
Order placed for f/u pt/inr

## 2014-06-06 ENCOUNTER — Encounter: Payer: Self-pay | Admitting: *Deleted

## 2014-06-06 ENCOUNTER — Telehealth: Payer: Self-pay | Admitting: *Deleted

## 2014-06-06 ENCOUNTER — Other Ambulatory Visit: Payer: Self-pay | Admitting: *Deleted

## 2014-06-06 NOTE — Telephone Encounter (Signed)
She is on a low dose of zoloft.  One option would be to titrate the medication.  Can discuss other treatments with her.  I am going to be gone this week.  I can see her 06/17/14 at 2:15 for work in for this problem.  If needs evaluation prior to this - can see if Betty [name] has opening.

## 2014-06-06 NOTE — Telephone Encounter (Signed)
Pt states that the Sertraline is making her not feel well. C/O Fatigue, no energy, depressed, no desire to do anything. Pt states that Dr. Mervyn Skeeters had on Venlafaxine 200mg  QAM & states that it helped a lot. He had stopped it after she told him that she was sweating more than usual. She then realized that the Venlafaxine was not the cause because those symptoms have not changed. Pt also mentioned that you had discussed starting a bladder medication for her but she wasn't sure when. Please advise

## 2014-06-06 NOTE — Telephone Encounter (Signed)
Sent mychart message

## 2014-06-08 ENCOUNTER — Other Ambulatory Visit: Payer: Self-pay | Admitting: Internal Medicine

## 2014-06-09 ENCOUNTER — Other Ambulatory Visit (INDEPENDENT_AMBULATORY_CARE_PROVIDER_SITE_OTHER): Payer: Medicare Other

## 2014-06-09 DIAGNOSIS — Z7901 Long term (current) use of anticoagulants: Secondary | ICD-10-CM

## 2014-06-09 DIAGNOSIS — Z5181 Encounter for therapeutic drug level monitoring: Secondary | ICD-10-CM

## 2014-06-09 LAB — PROTIME-INR
INR: 3.2 ratio — ABNORMAL HIGH (ref 0.8–1.0)
PROTHROMBIN TIME: 34 s — AB (ref 9.6–13.1)

## 2014-06-13 ENCOUNTER — Other Ambulatory Visit: Payer: Self-pay | Admitting: Internal Medicine

## 2014-06-13 ENCOUNTER — Other Ambulatory Visit (INDEPENDENT_AMBULATORY_CARE_PROVIDER_SITE_OTHER): Payer: Medicare Other

## 2014-06-13 DIAGNOSIS — Z7901 Long term (current) use of anticoagulants: Secondary | ICD-10-CM

## 2014-06-13 LAB — PROTIME-INR
INR: 2.3 ratio — AB (ref 0.8–1.0)
PROTHROMBIN TIME: 25.4 s — AB (ref 9.6–13.1)

## 2014-06-14 ENCOUNTER — Encounter: Payer: Self-pay | Admitting: *Deleted

## 2014-06-14 NOTE — Progress Notes (Signed)
6/23 sent mychart, awaiting response/bn

## 2014-06-17 ENCOUNTER — Encounter: Payer: Self-pay | Admitting: Internal Medicine

## 2014-06-17 ENCOUNTER — Ambulatory Visit (INDEPENDENT_AMBULATORY_CARE_PROVIDER_SITE_OTHER): Payer: Medicare Other | Admitting: Internal Medicine

## 2014-06-17 VITALS — BP 102/60 | HR 70 | Temp 99.2°F | Ht 68.5 in | Wt 240.0 lb

## 2014-06-17 DIAGNOSIS — F32A Depression, unspecified: Secondary | ICD-10-CM

## 2014-06-17 DIAGNOSIS — I110 Hypertensive heart disease with heart failure: Secondary | ICD-10-CM

## 2014-06-17 DIAGNOSIS — F329 Major depressive disorder, single episode, unspecified: Secondary | ICD-10-CM

## 2014-06-17 DIAGNOSIS — R32 Unspecified urinary incontinence: Secondary | ICD-10-CM

## 2014-06-17 DIAGNOSIS — I776 Arteritis, unspecified: Secondary | ICD-10-CM

## 2014-06-17 DIAGNOSIS — I7782 Antineutrophilic cytoplasmic antibody (ANCA) vasculitis: Secondary | ICD-10-CM

## 2014-06-17 DIAGNOSIS — I482 Chronic atrial fibrillation, unspecified: Secondary | ICD-10-CM

## 2014-06-17 DIAGNOSIS — G4733 Obstructive sleep apnea (adult) (pediatric): Secondary | ICD-10-CM

## 2014-06-17 DIAGNOSIS — I4891 Unspecified atrial fibrillation: Secondary | ICD-10-CM

## 2014-06-17 DIAGNOSIS — F3289 Other specified depressive episodes: Secondary | ICD-10-CM

## 2014-06-17 DIAGNOSIS — K219 Gastro-esophageal reflux disease without esophagitis: Secondary | ICD-10-CM

## 2014-06-17 DIAGNOSIS — E119 Type 2 diabetes mellitus without complications: Secondary | ICD-10-CM

## 2014-06-17 DIAGNOSIS — Z86010 Personal history of colonic polyps: Secondary | ICD-10-CM

## 2014-06-17 DIAGNOSIS — I509 Heart failure, unspecified: Secondary | ICD-10-CM

## 2014-06-17 MED ORDER — VENLAFAXINE HCL ER 75 MG PO CP24: 75.0000 mg | ORAL_CAPSULE | Freq: Every day | ORAL | Status: DC

## 2014-06-17 MED ORDER — HYDROCORTISONE ACE-PRAMOXINE 1-1 % RE CREA: 1.0000 "application " | TOPICAL_CREAM | Freq: Two times a day (BID) | RECTAL | Status: DC

## 2014-06-17 NOTE — Progress Notes (Signed)
Pre visit review using our clinic review tool, if applicable. No additional management support is needed unless otherwise documented below in the visit note. 

## 2014-06-17 NOTE — Patient Instructions (Signed)
Change sertraline to 1/2 (50mg ) tablet per day for 10 days and then stop.   Start Effexor one per day - starting tomorrow.

## 2014-06-18 ENCOUNTER — Encounter: Payer: Self-pay | Admitting: Internal Medicine

## 2014-06-18 NOTE — Assessment & Plan Note (Signed)
Blood pressure controlled.  Will continue same medication regimen.  Follow.

## 2014-06-18 NOTE — Assessment & Plan Note (Signed)
Uses CPAP.  Follow.

## 2014-06-18 NOTE — Assessment & Plan Note (Signed)
Per pt, stable.  Followed by Dr Rosalita Chessman (rheumatology).   On imuran now.  Counts being followed by Dr Rosalita Chessman.

## 2014-06-18 NOTE — Assessment & Plan Note (Signed)
Some increased depression.  No suicidal thoughts.  Does feel better than previous.  Does not feel zoloft is working for her.  Will taper off zoloft.  She reports feeling better on effexor.  Will start effexor 75mg  q day and decrease zoloft to 1/2 tablet q day for 10 days and then stop.  Follow closely.  Titrate up if needed.

## 2014-06-18 NOTE — Assessment & Plan Note (Signed)
On coumadin.  Follow pt/inr.   Rate controlled.  Avoid antiinflammatories.

## 2014-06-18 NOTE — Progress Notes (Signed)
Subjective:    Patient ID: Betty [name], female    DOB: [birth year], 68 y.o.   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  Is followed by Dr Rosalita Chessman (rheumatology - [facility]).  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  She spent 4.5-5 months in rehab.  She is able to walk and works part time.  She also has a history of atrial fib and is s/p two ablations.  On coumadin. Has a history of DVT as well.  She also has a history of hypertension, diabetes, urinary incontinence and depression.  She comes in today to discuss zoloft and medication changes.  She reports that she does not feel zoloft works well for her.  Previously was on effexor and felt this worked better for her.  Felt better on effexor.   No chest pain or tightness.  No increased heart rate or palpitations.  No bowel change.  Breathing stable.       Past Medical History  Diagnosis Date  . Headache(784.0)   . Backache, unspecified   . Herpes zoster without mention of complication   . Osteoarthrosis, unspecified whether generalized or localized, unspecified site   . Nontoxic uninodular goiter   . Obesity, unspecified   . Esophageal reflux   . Unspecified sleep apnea   . Atrial fibrillation   . Acute diastolic heart failure   . Cardiomegaly   . Hypertension     heart controlled w CHF  . Asthma   . Diabetes mellitus without complication   . Allergy   . Hx: UTI (urinary tract infection)   . Urine incontinence     hx of  . ANCA-associated vasculitis     Outpatient Encounter Prescriptions as of 06/17/2014  Medication Sig  . azaTHIOprine (IMURAN) 50 MG tablet Take 100 mg by mouth daily.  . calcium-vitamin D (OSCAL WITH D) 500-200 MG-UNIT per tablet Take 1 tablet by mouth daily.  Marland Kitchen diltiazem (TIAZAC) 240 MG 24 hr capsule Take 240 mg by mouth daily.  . furosemide (LASIX) 40 MG tablet Take 1 tablet (40 mg total) by mouth daily.  Marland Kitchen gabapentin  (NEURONTIN) 600 MG tablet TAKE 1 TABLET (600 MG TOTAL) BY MOUTH 2 (TWO) TIMES DAILY.  Marland Kitchen glipiZIDE (GLUCOTROL) 10 MG tablet Take 10 mg by mouth daily.  Marland Kitchen HYDROcodone-acetaminophen (NORCO) 5-325 MG per tablet Take 1 tablet by mouth daily as needed.  Marland Kitchen losartan (COZAAR) 50 MG tablet TAKE 1 TABLET BY MOUTH DAILY  . metFORMIN (GLUCOPHAGE) 1000 MG tablet TAKE 1 TABLET BY MOUTH TWICE A DAY  . omeprazole (PRILOSEC) 40 MG capsule TAKE ONE CAPSULE AT BEDTIME  . sertraline (ZOLOFT) 50 MG tablet Take 100 mg by mouth 2 (two) times daily. 1/2 tablet x 2 weeks and then increase to one whole tablet per day - after letting me know how doing.  Will taper off wellbutrin  . traZODone (DESYREL) 100 MG tablet Take 100 mg by mouth 2 (two) times daily.  Marland Kitchen triamterene-hydrochlorothiazide (DYAZIDE) 37.5-25 MG per capsule Take 1 each (1 capsule total) by mouth daily.  Marland Kitchen warfarin (COUMADIN) 1 MG tablet TAKE AS DIRECTED  . warfarin (COUMADIN) 4 MG tablet Take 1 tablet (4 mg total) by mouth daily.  . [DISCONTINUED] pramoxine-hydrocortisone (ANALPRAM-HC) 1-1 % rectal cream Place 1 application rectally 2 (two) times daily.  . [DISCONTINUED] sertraline (ZOLOFT) 50 MG tablet 1/2 tablet x 2 weeks and then  increase to one whole tablet per day - after letting me know how doing.  Will taper off wellbutrin  . pramoxine-hydrocortisone (ANALPRAM-HC) 1-1 % rectal cream Place 1 application rectally 2 (two) times daily.  Marland Kitchen venlafaxine XR (EFFEXOR XR) 75 MG 24 hr capsule Take 1 capsule (75 mg total) by mouth daily with breakfast.  . warfarin (COUMADIN) 5 MG tablet Take 1 tablet (5 mg total) by mouth daily. Take as directed  . [DISCONTINUED] buPROPion (WELLBUTRIN SR) 200 MG 12 hr tablet TAKE 1 TABLET BY MOUTH TWICE A DAY  . [DISCONTINUED] cephALEXin (KEFLEX) 500 MG capsule Take 1 capsule (500 mg total) by mouth 3 (three) times daily.  . [DISCONTINUED] fluconazole (DIFLUCAN) 150 MG tablet Take 1 tablet (150 mg total) by mouth once.    Review  of Systems Patient denies any headache, lightheadedness or dizziness.  No significant sinus or allergy symptoms currently.  No chest pain, tightness or palpitations.  No increased shortness of breath, cough or congestion.  No acid reflux.  Controlled.  No nausea or vomiting.  No abdominal pain or cramping.  No bowel change, such as diarrhea, constipation, BRBPR or melana.   Also has sleep apnea.  Uses CPAP.  States she is up to date with her colonoscopy.  Last 2013.  Has a history of colon polyps.   Sees rheumatology regularly.   On Imuran.  Reports increased urinary frequency with symptoms c/w overactive bladder.          Objective:   Physical Exam  Filed Vitals:   06/17/14 1418  BP: 102/60  Pulse: 70  Temp: 99.2 F (37.3 C)   Blood pressure recheck:  42/71  68 year old female in no acute distress.   HEENT:  Nares- clear.  Oropharynx - without lesions. NECK:  Supple.  Nontender.  No audible bruit.  HEART:  Appears to be regular. LUNGS:  No crackles or wheezing audible.  Respirations even and unlabored.  RADIAL PULSE:  Equal bilaterally.  ABDOMEN:  Soft, nontender.  Bowel sounds present and normal.  No audible abdominal bruit. EXTREMITIES:  No increased edema present.      FEET:  No lesions.      Assessment & Plan:  MSK:  Increased joint aching.  Sees Dr Rosalita Chessman.  Referred to ortho.  Saw ortho.  Found a growth on her foot.  Saw oncology.  States everything checked out fine.  Recommended f/u in 6 months.     HEALTH MAINTENANCE.  Physical 08/27/13.    Has had a hysterectomy, but had suspicious cells with her paps.  Pelvic/pap at physical negative with negative HPV.    States up to date with colonoscopy.  Last 2013.  Mammogram 03/07/14 - Birads I.   I spent 25 minutes with the patient and more than 50% of the time was spent in consultation regarding the above.

## 2014-06-18 NOTE — Assessment & Plan Note (Signed)
Colonoscopy as outlined.  Up to date.

## 2014-06-18 NOTE — Assessment & Plan Note (Signed)
With urinary frequency.  Symptoms appear to be c/w an overactive bladder.  Will hold on starting medication right now.  Follow.

## 2014-06-18 NOTE — Assessment & Plan Note (Signed)
Controlled on current medication regimen.  Follow.  Avoid antiinflammatories.

## 2014-06-18 NOTE — Assessment & Plan Note (Signed)
Sugars as outlined.  Follow metabolic panel and [id number].

## 2014-06-28 ENCOUNTER — Ambulatory Visit (INDEPENDENT_AMBULATORY_CARE_PROVIDER_SITE_OTHER): Payer: Medicare Other | Admitting: Internal Medicine

## 2014-06-28 ENCOUNTER — Encounter: Payer: Self-pay | Admitting: Internal Medicine

## 2014-06-28 VITALS — BP 120/70 | HR 82 | Temp 99.4°F | Ht 68.5 in | Wt 239.5 lb

## 2014-06-28 DIAGNOSIS — I482 Chronic atrial fibrillation, unspecified: Secondary | ICD-10-CM

## 2014-06-28 DIAGNOSIS — I776 Arteritis, unspecified: Secondary | ICD-10-CM

## 2014-06-28 DIAGNOSIS — Z860100 Personal history of colon polyps, unspecified: Secondary | ICD-10-CM

## 2014-06-28 DIAGNOSIS — I4891 Unspecified atrial fibrillation: Secondary | ICD-10-CM

## 2014-06-28 DIAGNOSIS — G4733 Obstructive sleep apnea (adult) (pediatric): Secondary | ICD-10-CM

## 2014-06-28 DIAGNOSIS — F32A Depression, unspecified: Secondary | ICD-10-CM

## 2014-06-28 DIAGNOSIS — R945 Abnormal results of liver function studies: Secondary | ICD-10-CM

## 2014-06-28 DIAGNOSIS — E119 Type 2 diabetes mellitus without complications: Secondary | ICD-10-CM

## 2014-06-28 DIAGNOSIS — R7989 Other specified abnormal findings of blood chemistry: Secondary | ICD-10-CM

## 2014-06-28 DIAGNOSIS — F3289 Other specified depressive episodes: Secondary | ICD-10-CM

## 2014-06-28 DIAGNOSIS — I7782 Antineutrophilic cytoplasmic antibody (ANCA) vasculitis: Secondary | ICD-10-CM

## 2014-06-28 DIAGNOSIS — I110 Hypertensive heart disease with heart failure: Secondary | ICD-10-CM

## 2014-06-28 DIAGNOSIS — F329 Major depressive disorder, single episode, unspecified: Secondary | ICD-10-CM

## 2014-06-28 DIAGNOSIS — I509 Heart failure, unspecified: Secondary | ICD-10-CM

## 2014-06-28 DIAGNOSIS — K219 Gastro-esophageal reflux disease without esophagitis: Secondary | ICD-10-CM

## 2014-06-28 LAB — PROTIME-INR
INR: 2.5 ratio — AB (ref 0.8–1.0)
Prothrombin Time: 27.2 s — ABNORMAL HIGH (ref 9.6–13.1)

## 2014-06-28 MED ORDER — VENLAFAXINE HCL ER 150 MG PO CP24: 150.0000 mg | ORAL_CAPSULE | Freq: Every day | ORAL | Status: DC

## 2014-06-28 MED ORDER — HYDROCODONE-ACETAMINOPHEN 5-325 MG PO TABS: 1.0000 | ORAL_TABLET | Freq: Every day | ORAL | Status: DC | PRN

## 2014-06-28 NOTE — Assessment & Plan Note (Signed)
Blood pressure controlled.  Will continue same medication regimen.  Follow.

## 2014-06-28 NOTE — Assessment & Plan Note (Addendum)
Some increased depression.  No suicidal thoughts.  Does feel better than previous.  On Effexor.  Feels she needs to increase the dose.  Will increase to 150mg  q day.  Follow closely.

## 2014-06-28 NOTE — Progress Notes (Signed)
Subjective:    Patient ID: Betty [name], female    DOB: [birth year], 68 y.o.   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  Is followed by Dr Rosalita Chessman (rheumatology - [facility]).  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  She spent 4.5-5 months in rehab.  She is able to walk and works part time.  She also has a history of atrial fib and is s/p two ablations.  On coumadin. Has a history of DVT as well.  She also has a history of hypertension, diabetes, urinary incontinence and depression.  She comes in today for a scheduled follow up.  Was started on effexor last visit.  Feels she is doing better on effexor.  Feels she needs to increase the dose.  No chest pain or tightness.  No increased heart rate or palpitations.  No bowel change.  Breathing stable.  Hurt her left index finger.  Discussed further evaluation.  She declines xray.  Agreed to splint.     Past Medical History  Diagnosis Date  . Headache(784.0)   . Backache, unspecified   . Herpes zoster without mention of complication   . Osteoarthrosis, unspecified whether generalized or localized, unspecified site   . Nontoxic uninodular goiter   . Obesity, unspecified   . Esophageal reflux   . Unspecified sleep apnea   . Atrial fibrillation   . Acute diastolic heart failure   . Cardiomegaly   . Hypertension     heart controlled w CHF  . Asthma   . Diabetes mellitus without complication   . Allergy   . Hx: UTI (urinary tract infection)   . Urine incontinence     hx of  . ANCA-associated vasculitis     Outpatient Encounter Prescriptions as of 06/28/2014  Medication Sig  . azaTHIOprine (IMURAN) 50 MG tablet Take 100 mg by mouth daily.  . calcium-vitamin D (OSCAL WITH D) 500-200 MG-UNIT per tablet Take 1 tablet by mouth daily.  Marland Kitchen diltiazem (TIAZAC) 240 MG 24 hr capsule Take 240 mg by mouth daily.  . furosemide (LASIX) 40 MG tablet Take 1 tablet (40 mg  total) by mouth daily.  Marland Kitchen gabapentin (NEURONTIN) 600 MG tablet TAKE 1 TABLET (600 MG TOTAL) BY MOUTH 2 (TWO) TIMES DAILY.  Marland Kitchen glipiZIDE (GLUCOTROL) 10 MG tablet Take 10 mg by mouth daily.  Marland Kitchen HYDROcodone-acetaminophen (NORCO) 5-325 MG per tablet Take 1 tablet by mouth daily as needed.  Marland Kitchen losartan (COZAAR) 50 MG tablet TAKE 1 TABLET BY MOUTH DAILY  . metFORMIN (GLUCOPHAGE) 1000 MG tablet TAKE 1 TABLET BY MOUTH TWICE A DAY  . omeprazole (PRILOSEC) 40 MG capsule TAKE ONE CAPSULE AT BEDTIME  . pramoxine-hydrocortisone (ANALPRAM-HC) 1-1 % rectal cream Place 1 application rectally 2 (two) times daily.  . sertraline (ZOLOFT) 50 MG tablet Take 100 mg by mouth 2 (two) times daily. 1/2 tablet x 2 weeks and then increase to one whole tablet per day - after letting me know how doing.  Will taper off wellbutrin  . traZODone (DESYREL) 100 MG tablet Take 100 mg by mouth 2 (two) times daily.  Marland Kitchen triamterene-hydrochlorothiazide (DYAZIDE) 37.5-25 MG per capsule Take 1 each (1 capsule total) by mouth daily.  Marland Kitchen venlafaxine XR (EFFEXOR XR) 75 MG 24 hr capsule Take 1 capsule (75 mg total) by mouth daily with breakfast.  . warfarin (COUMADIN) 1 MG tablet TAKE AS DIRECTED  . warfarin (  COUMADIN) 4 MG tablet Take 1 tablet (4 mg total) by mouth daily.  Marland Kitchen warfarin (COUMADIN) 5 MG tablet Take 1 tablet (5 mg total) by mouth daily. Take as directed    Review of Systems Patient denies any headache, lightheadedness or dizziness.  No significant sinus or allergy symptoms currently.  No chest pain, tightness or palpitations.  No increased shortness of breath, cough or congestion.  No acid reflux.  Controlled.  No nausea or vomiting.  No abdominal pain or cramping.  No bowel change, such as diarrhea, constipation, BRBPR or melana.   Also has sleep apnea.  Uses CPAP.  States she is up to date with her colonoscopy.  Last 2013.  Has a history of colon polyps.   Sees rheumatology regularly.   On Imuran.  Feels better on effexor.  Feels she  needs to increase the dose.  Hurt her left index finger.  Declines xray.          Objective:   Physical Exam  Filed Vitals:   06/28/14 0904  BP: 120/70  Pulse: 82  Temp: 99.4 F (45.36 C)   68 year old female in no acute distress.   HEENT:  Nares- clear.  Oropharynx - without lesions. NECK:  Supple.  Nontender.  No audible bruit.  HEART:  Appears to be regular. LUNGS:  No crackles or wheezing audible.  Respirations even and unlabored.  RADIAL PULSE:  Equal bilaterally.  ABDOMEN:  Soft, nontender.  Bowel sounds present and normal.  No audible abdominal bruit. EXTREMITIES:  No increased edema present.      FEET:  No lesions.  MSK:  Slight tenderness left index finger s/p injury.       Assessment & Plan:  MSK:  Increased joint aching.  Sees Dr Rosalita Chessman.  Referred to ortho.  Saw ortho.  Found a growth on her foot.  Saw oncology.  States everything checked out fine.  Recommended f/u in 6 months.     HEALTH MAINTENANCE.  Physical 08/27/13.    Has had a hysterectomy, but had suspicious cells with her paps.  Pelvic/pap at physical negative with negative HPV.    States up to date with colonoscopy.  Last 2013.  Mammogram 03/07/14 - Birads I.   I spent 25 minutes with the patient and more than 50% of the time was spent in consultation regarding the above.

## 2014-06-29 ENCOUNTER — Encounter: Payer: Self-pay | Admitting: *Deleted

## 2014-07-03 ENCOUNTER — Encounter: Payer: Self-pay | Admitting: Internal Medicine

## 2014-07-03 NOTE — Assessment & Plan Note (Signed)
Uses CPAP.  Follow.

## 2014-07-03 NOTE — Assessment & Plan Note (Signed)
On coumadin.  Follow pt/inr.   Rate controlled.  Avoid antiinflammatories.

## 2014-07-03 NOTE — Assessment & Plan Note (Signed)
Colonoscopy as outlined.  Up to date.

## 2014-07-03 NOTE — Assessment & Plan Note (Signed)
Controlled on current medication regimen.  Follow.  Avoid antiinflammatories.

## 2014-07-03 NOTE — Assessment & Plan Note (Signed)
Sugars as outlined.  Follow metabolic panel and [id number].

## 2014-07-03 NOTE — Assessment & Plan Note (Signed)
Per pt, stable.  Followed by Dr Rosalita Chessman (rheumatology).   On imuran now.  Counts being followed by Dr Rosalita Chessman.

## 2014-07-03 NOTE — Assessment & Plan Note (Signed)
Recheck liver panel with next labs.  Referred to GI for further evaluation and w/up of her fatty liver.

## 2014-07-04 ENCOUNTER — Encounter: Payer: Self-pay | Admitting: Internal Medicine

## 2014-07-10 ENCOUNTER — Encounter: Payer: Self-pay | Admitting: Internal Medicine

## 2014-07-10 DIAGNOSIS — Z86010 Personal history of colonic polyps: Secondary | ICD-10-CM

## 2014-07-13 ENCOUNTER — Other Ambulatory Visit: Payer: Medicare Other

## 2014-07-14 ENCOUNTER — Telehealth: Payer: Self-pay | Admitting: Internal Medicine

## 2014-07-14 ENCOUNTER — Other Ambulatory Visit (INDEPENDENT_AMBULATORY_CARE_PROVIDER_SITE_OTHER): Payer: Medicare Other

## 2014-07-14 DIAGNOSIS — Z7901 Long term (current) use of anticoagulants: Secondary | ICD-10-CM

## 2014-07-14 LAB — PROTIME-INR
INR: 1.6 ratio — AB (ref 0.8–1.0)
PROTHROMBIN TIME: 17.3 s — AB (ref 9.6–13.1)

## 2014-07-14 NOTE — Telephone Encounter (Signed)
Please see note from pt

## 2014-07-14 NOTE — Telephone Encounter (Signed)
For th past week and a half her blood sugar has been reading from 140-165 in the am before eating and at 6:00 pm before eating it has been reading  from 85-100. The patient wants to take another diabetic medication . She does not want to take the metformin and glipizide because of the side effects.

## 2014-07-15 ENCOUNTER — Other Ambulatory Visit: Payer: Medicare Other

## 2014-07-15 NOTE — Telephone Encounter (Signed)
Notify pt that I would like for her to decrease her glipizide to 5mg  q day.  She is currently taking 10mg  q day.  I want her to continue the metformin as she is doing.  Per previous message, she is concerned about these medications causing memory loss.  Memory loss is not a side effect that we tend to see with these medications.  Check and record sugars bid and give [place] a copy of the readings within the next 2 weeks.  See lab results for pt/inr.  Can notify pt of results when call regarding her sugars.

## 2014-07-15 NOTE — Telephone Encounter (Signed)
Notified pt. 

## 2014-07-20 ENCOUNTER — Other Ambulatory Visit (INDEPENDENT_AMBULATORY_CARE_PROVIDER_SITE_OTHER): Payer: Medicare Other

## 2014-07-20 ENCOUNTER — Telehealth: Payer: Self-pay | Admitting: *Deleted

## 2014-07-20 DIAGNOSIS — Z7901 Long term (current) use of anticoagulants: Secondary | ICD-10-CM

## 2014-07-20 LAB — POCT URINALYSIS DIPSTICK
Bilirubin, UA: NEGATIVE
Glucose, UA: NEGATIVE
KETONES UA: NEGATIVE
LEUKOCYTES UA: NEGATIVE
Nitrite, UA: NEGATIVE
PH UA: 7.5
PROTEIN UA: NEGATIVE
Spec Grav, UA: 1.015
UROBILINOGEN UA: 0.2

## 2014-07-20 LAB — PROTIME-INR
INR: 2.4 ratio — ABNORMAL HIGH (ref 0.8–1.0)
Prothrombin Time: 26.4 s — ABNORMAL HIGH (ref 9.6–13.1)

## 2014-07-20 NOTE — Addendum Note (Signed)
Addended by: Karlene Einstein D on: 07/20/2014 03:40 PM   Modules accepted: Orders

## 2014-07-20 NOTE — Telephone Encounter (Signed)
Pt would like to do a u/a  Pt is leaving a urine sample

## 2014-07-20 NOTE — Telephone Encounter (Signed)
Reviewed note.  Urine dip already obtained.

## 2014-07-20 NOTE — Addendum Note (Signed)
Addended by: Karlene Einstein D on: 07/20/2014 09:25 AM   Modules accepted: Orders

## 2014-07-21 ENCOUNTER — Encounter: Payer: Self-pay | Admitting: *Deleted

## 2014-07-23 LAB — CULTURE, URINE COMPREHENSIVE: Colony Count: >=100000

## 2014-07-25 ENCOUNTER — Encounter: Payer: Self-pay | Admitting: *Deleted

## 2014-07-25 MED ORDER — CIPROFLOXACIN HCL 250 MG PO TABS: 250.0000 mg | ORAL_TABLET | Freq: Two times a day (BID) | ORAL | Status: DC

## 2014-07-28 ENCOUNTER — Other Ambulatory Visit: Payer: Medicare Other

## 2014-07-29 ENCOUNTER — Other Ambulatory Visit (INDEPENDENT_AMBULATORY_CARE_PROVIDER_SITE_OTHER): Payer: Medicare Other

## 2014-07-29 DIAGNOSIS — Z7901 Long term (current) use of anticoagulants: Secondary | ICD-10-CM

## 2014-07-29 LAB — PROTIME-INR
INR: 2.9 ratio — AB (ref 0.8–1.0)
PROTHROMBIN TIME: 31.3 s — AB (ref 9.6–13.1)

## 2014-07-31 ENCOUNTER — Other Ambulatory Visit: Payer: Self-pay | Admitting: Internal Medicine

## 2014-07-31 DIAGNOSIS — Z7901 Long term (current) use of anticoagulants: Secondary | ICD-10-CM

## 2014-07-31 NOTE — Progress Notes (Signed)
Order placed for pt/inr 

## 2014-08-01 ENCOUNTER — Encounter: Payer: Self-pay | Admitting: *Deleted

## 2014-08-03 ENCOUNTER — Other Ambulatory Visit (INDEPENDENT_AMBULATORY_CARE_PROVIDER_SITE_OTHER): Payer: Medicare Other

## 2014-08-03 ENCOUNTER — Other Ambulatory Visit: Payer: Medicare Other

## 2014-08-03 DIAGNOSIS — Z7901 Long term (current) use of anticoagulants: Secondary | ICD-10-CM

## 2014-08-03 DIAGNOSIS — Z5181 Encounter for therapeutic drug level monitoring: Secondary | ICD-10-CM

## 2014-08-03 LAB — PROTIME-INR
INR: 1.8 ratio — ABNORMAL HIGH (ref 0.8–1.0)
PROTHROMBIN TIME: 20 s — AB (ref 9.6–13.1)

## 2014-08-04 ENCOUNTER — Encounter: Payer: Self-pay | Admitting: *Deleted

## 2014-08-04 ENCOUNTER — Other Ambulatory Visit: Payer: Medicare Other

## 2014-08-06 ENCOUNTER — Other Ambulatory Visit: Payer: Self-pay | Admitting: Internal Medicine

## 2014-08-07 ENCOUNTER — Other Ambulatory Visit: Payer: Self-pay | Admitting: Internal Medicine

## 2014-08-10 ENCOUNTER — Other Ambulatory Visit: Payer: Self-pay | Admitting: Internal Medicine

## 2014-08-10 ENCOUNTER — Encounter: Payer: Self-pay | Admitting: *Deleted

## 2014-08-10 DIAGNOSIS — R7989 Other specified abnormal findings of blood chemistry: Secondary | ICD-10-CM

## 2014-08-10 DIAGNOSIS — I509 Heart failure, unspecified: Secondary | ICD-10-CM

## 2014-08-10 DIAGNOSIS — R945 Abnormal results of liver function studies: Secondary | ICD-10-CM

## 2014-08-10 DIAGNOSIS — I5031 Acute diastolic (congestive) heart failure: Secondary | ICD-10-CM

## 2014-08-10 DIAGNOSIS — I482 Chronic atrial fibrillation, unspecified: Secondary | ICD-10-CM

## 2014-08-10 DIAGNOSIS — I110 Hypertensive heart disease with heart failure: Secondary | ICD-10-CM

## 2014-08-10 DIAGNOSIS — E119 Type 2 diabetes mellitus without complications: Secondary | ICD-10-CM

## 2014-08-10 DIAGNOSIS — D649 Anemia, unspecified: Secondary | ICD-10-CM

## 2014-08-10 NOTE — Progress Notes (Signed)
Order placed for cholesterol check.

## 2014-08-10 NOTE — Progress Notes (Signed)
Order placed for f/u labs.  

## 2014-08-11 ENCOUNTER — Other Ambulatory Visit: Payer: Medicare Other

## 2014-08-15 ENCOUNTER — Other Ambulatory Visit: Payer: Medicare Other

## 2014-08-16 ENCOUNTER — Encounter: Payer: Self-pay | Admitting: *Deleted

## 2014-08-16 ENCOUNTER — Telehealth: Payer: Self-pay | Admitting: Internal Medicine

## 2014-08-16 ENCOUNTER — Other Ambulatory Visit: Payer: Self-pay | Admitting: Internal Medicine

## 2014-08-16 ENCOUNTER — Other Ambulatory Visit (INDEPENDENT_AMBULATORY_CARE_PROVIDER_SITE_OTHER): Payer: Medicare Other

## 2014-08-16 DIAGNOSIS — E119 Type 2 diabetes mellitus without complications: Secondary | ICD-10-CM

## 2014-08-16 DIAGNOSIS — Z7901 Long term (current) use of anticoagulants: Secondary | ICD-10-CM

## 2014-08-16 DIAGNOSIS — R7989 Other specified abnormal findings of blood chemistry: Secondary | ICD-10-CM

## 2014-08-16 DIAGNOSIS — D649 Anemia, unspecified: Secondary | ICD-10-CM

## 2014-08-16 DIAGNOSIS — I482 Chronic atrial fibrillation, unspecified: Secondary | ICD-10-CM

## 2014-08-16 DIAGNOSIS — I5031 Acute diastolic (congestive) heart failure: Secondary | ICD-10-CM

## 2014-08-16 DIAGNOSIS — I4891 Unspecified atrial fibrillation: Secondary | ICD-10-CM

## 2014-08-16 DIAGNOSIS — R945 Abnormal results of liver function studies: Secondary | ICD-10-CM

## 2014-08-16 LAB — BASIC METABOLIC PANEL
BUN: 16 mg/dL (ref 6–23)
CALCIUM: 8.9 mg/dL (ref 8.4–10.5)
CO2: 30 meq/L (ref 19–32)
Chloride: 97 mEq/L (ref 96–112)
Creatinine, Ser: 0.7 mg/dL (ref 0.4–1.2)
GFR: 91.54 mL/min (ref >60.00)
GLUCOSE: 119 mg/dL — AB (ref 70–99)
Potassium: 3.8 mEq/L (ref 3.5–5.1)
SODIUM: 136 meq/L (ref 135–145)

## 2014-08-16 LAB — HEPATIC FUNCTION PANEL
ALBUMIN: 3.8 g/dL (ref 3.5–5.2)
ALT: 25 U/L (ref 0–35)
AST: 37 U/L (ref 0–37)
Alkaline Phosphatase: 53 U/L (ref 39–117)
BILIRUBIN DIRECT: 0.1 mg/dL (ref 0.0–0.3)
Total Bilirubin: 0.3 mg/dL (ref 0.2–1.2)
Total Protein: 7.2 g/dL (ref 6.0–8.3)

## 2014-08-16 LAB — CBC WITH DIFFERENTIAL/PLATELET
BASOS ABS: 0 10*3/uL (ref 0.0–0.1)
Basophils Relative: 0.2 % (ref 0.0–3.0)
Eosinophils Absolute: 0.2 10*3/uL (ref 0.0–0.7)
Eosinophils Relative: 2.5 % (ref 0.0–5.0)
HCT: 34.7 % — ABNORMAL LOW (ref 36.0–46.0)
Hemoglobin: 11.3 g/dL — ABNORMAL LOW (ref 12.0–15.0)
LYMPHS ABS: 1 10*3/uL (ref 0.7–4.0)
LYMPHS PCT: 11.9 % — AB (ref 12.0–46.0)
MCHC: 32.6 g/dL (ref 30.0–36.0)
MCV: 79.3 fl (ref 78.0–100.0)
Monocytes Absolute: 0.9 10*3/uL (ref 0.1–1.0)
Monocytes Relative: 10.5 % (ref 3.0–12.0)
Neutro Abs: 6.1 10*3/uL (ref 1.4–7.7)
Neutrophils Relative %: 74.9 % (ref 43.0–77.0)
PLATELETS: 336 10*3/uL (ref 150.0–400.0)
RBC: 4.38 Mil/uL (ref 3.87–5.11)
RDW: 16.7 % — AB (ref 11.5–15.5)
WBC: 8.1 10*3/uL (ref 4.0–10.5)

## 2014-08-16 LAB — LIPID PANEL
CHOL/HDL RATIO: 4
Cholesterol: 150 mg/dL (ref 0–200)
HDL: 36.3 mg/dL — ABNORMAL LOW (ref >39.00)
LDL CALC: 97 mg/dL (ref 0–99)
NonHDL: 113.7
Triglycerides: 85 mg/dL (ref 0.0–149.0)
VLDL: 17 mg/dL (ref 0.0–40.0)

## 2014-08-16 LAB — PROTIME-INR
INR: 2 ratio — ABNORMAL HIGH (ref 0.8–1.0)
PROTHROMBIN TIME: 21.6 s — AB (ref 9.6–13.1)

## 2014-08-16 LAB — FERRITIN: Ferritin: 17.9 ng/mL (ref 10.0–291.0)

## 2014-08-16 LAB — HEMOGLOBIN A1C: Hgb A1c MFr Bld: 5.9 % (ref 4.6–6.5)

## 2014-08-16 MED ORDER — HYDROCODONE-ACETAMINOPHEN 5-325 MG PO TABS: 1.0000 | ORAL_TABLET | Freq: Every day | ORAL | Status: DC | PRN

## 2014-08-16 NOTE — Telephone Encounter (Signed)
Prescription ok'd for hydrocodone #30 with no refills.  rx signed and placed in Toya's box.

## 2014-08-16 NOTE — Progress Notes (Signed)
Order placed for f/u pt/inr

## 2014-08-28 ENCOUNTER — Other Ambulatory Visit: Payer: Self-pay | Admitting: Internal Medicine

## 2014-08-31 ENCOUNTER — Ambulatory Visit: Payer: Self-pay | Admitting: Ophthalmology

## 2014-08-31 DIAGNOSIS — Z01810 Encounter for preprocedural cardiovascular examination: Secondary | ICD-10-CM

## 2014-08-31 DIAGNOSIS — I10 Essential (primary) hypertension: Secondary | ICD-10-CM

## 2014-08-31 LAB — PROTIME-INR
INR: 2.4
PROTHROMBIN TIME: 25.7 s — AB (ref 11.5–14.7)

## 2014-08-31 LAB — POTASSIUM: POTASSIUM: 3.7 mmol/L (ref 3.5–5.1)

## 2014-09-04 ENCOUNTER — Other Ambulatory Visit: Payer: Self-pay | Admitting: Internal Medicine

## 2014-09-06 ENCOUNTER — Telehealth: Payer: Self-pay | Admitting: *Deleted

## 2014-09-06 ENCOUNTER — Other Ambulatory Visit (INDEPENDENT_AMBULATORY_CARE_PROVIDER_SITE_OTHER): Payer: Medicare Other

## 2014-09-06 DIAGNOSIS — Z7901 Long term (current) use of anticoagulants: Secondary | ICD-10-CM

## 2014-09-06 DIAGNOSIS — Z5181 Encounter for therapeutic drug level monitoring: Secondary | ICD-10-CM

## 2014-09-06 LAB — PROTIME-INR
INR: 3 ratio — ABNORMAL HIGH (ref 0.8–1.0)
Prothrombin Time: 32 s — ABNORMAL HIGH (ref 9.6–13.1)

## 2014-09-06 NOTE — Telephone Encounter (Signed)
When does she need to stop her coumadin.  I just received results from her most recent INR.  Before adjusting or scheduling another lab appt, need to know how long she is going to be off the medication.  Thanks.

## 2014-09-06 NOTE — Telephone Encounter (Signed)
FYI Pt is have eye surgery on 09.22.2015

## 2014-09-07 NOTE — Telephone Encounter (Signed)
Already addressed.  See phone message.  Waiting for pts response.

## 2014-09-07 NOTE — Telephone Encounter (Signed)
Set my chart

## 2014-09-08 ENCOUNTER — Other Ambulatory Visit: Payer: Self-pay | Admitting: Internal Medicine

## 2014-09-08 ENCOUNTER — Encounter: Payer: Self-pay | Admitting: *Deleted

## 2014-09-08 ENCOUNTER — Telehealth: Payer: Self-pay | Admitting: Internal Medicine

## 2014-09-08 DIAGNOSIS — Z7901 Long term (current) use of anticoagulants: Secondary | ICD-10-CM

## 2014-09-08 NOTE — Telephone Encounter (Signed)
I sent her a my chart message to let her know that with the planned procedure 09/13/14 and the INR slightly increased - need to recheck tomorrow just to confirm not continuing to increase.  Please schedule her a lab appt date and time and notify her.  Thanks.

## 2014-09-08 NOTE — Progress Notes (Signed)
Order placed for f/u labs.  

## 2014-09-08 NOTE — Telephone Encounter (Signed)
Lab appt scheduled & sen via mychart

## 2014-09-09 ENCOUNTER — Other Ambulatory Visit (INDEPENDENT_AMBULATORY_CARE_PROVIDER_SITE_OTHER): Payer: Medicare Other

## 2014-09-09 ENCOUNTER — Ambulatory Visit (INDEPENDENT_AMBULATORY_CARE_PROVIDER_SITE_OTHER): Payer: Medicare Other | Admitting: Internal Medicine

## 2014-09-09 ENCOUNTER — Telehealth: Payer: Self-pay | Admitting: *Deleted

## 2014-09-09 ENCOUNTER — Encounter: Payer: Self-pay | Admitting: Internal Medicine

## 2014-09-09 VITALS — BP 120/70 | HR 72 | Temp 98.3°F | Ht 68.5 in | Wt 242.5 lb

## 2014-09-09 DIAGNOSIS — G4733 Obstructive sleep apnea (adult) (pediatric): Secondary | ICD-10-CM

## 2014-09-09 DIAGNOSIS — I4891 Unspecified atrial fibrillation: Secondary | ICD-10-CM

## 2014-09-09 DIAGNOSIS — R9431 Abnormal electrocardiogram [ECG] [EKG]: Secondary | ICD-10-CM

## 2014-09-09 DIAGNOSIS — M542 Cervicalgia: Secondary | ICD-10-CM

## 2014-09-09 DIAGNOSIS — Z5181 Encounter for therapeutic drug level monitoring: Secondary | ICD-10-CM

## 2014-09-09 DIAGNOSIS — Z7901 Long term (current) use of anticoagulants: Secondary | ICD-10-CM

## 2014-09-09 DIAGNOSIS — I482 Chronic atrial fibrillation, unspecified: Secondary | ICD-10-CM

## 2014-09-09 DIAGNOSIS — E119 Type 2 diabetes mellitus without complications: Secondary | ICD-10-CM

## 2014-09-09 LAB — BASIC METABOLIC PANEL
BUN: 15 mg/dL (ref 6–23)
CHLORIDE: 99 meq/L (ref 96–112)
CO2: 30 meq/L (ref 19–32)
Calcium: 8.8 mg/dL (ref 8.4–10.5)
Creatinine, Ser: 0.7 mg/dL (ref 0.4–1.2)
GFR: 88.51 mL/min (ref >60.00)
GLUCOSE: 106 mg/dL — AB (ref 70–99)
POTASSIUM: 3.6 meq/L (ref 3.5–5.1)
SODIUM: 139 meq/L (ref 135–145)

## 2014-09-09 LAB — PROTIME-INR
INR: 1.4 ratio — ABNORMAL HIGH (ref 0.8–1.0)
Prothrombin Time: 15.8 s — ABNORMAL HIGH (ref 9.6–13.1)

## 2014-09-09 NOTE — Telephone Encounter (Signed)
Pt seen in office.  See note.

## 2014-09-09 NOTE — Telephone Encounter (Signed)
Pt states she has having really bad neck pain and was wanting to know what she can take

## 2014-09-09 NOTE — Progress Notes (Signed)
Pre visit review using our clinic review tool, if applicable. No additional management support is needed unless otherwise documented below in the visit note. 

## 2014-09-09 NOTE — Telephone Encounter (Signed)
Pt was seen in office today-will address during visit

## 2014-09-11 ENCOUNTER — Encounter: Payer: Self-pay | Admitting: Internal Medicine

## 2014-09-11 DIAGNOSIS — R9431 Abnormal electrocardiogram [ECG] [EKG]: Secondary | ICD-10-CM | POA: Insufficient documentation

## 2014-09-11 NOTE — Assessment & Plan Note (Signed)
Uses CPAP.  Follow.

## 2014-09-11 NOTE — Assessment & Plan Note (Signed)
EKG here revealed SR with no acute ischemic changes.  QTC appears wnl.  She is currently asymptomatic.  No chest pain or tightness with increased activity or exertion.  No sob.  I feel from a cardiac standpoint she is at low risk to proceed with cataract surgery.  Will need close intra op and post op monitoring of her heart rate and blood pressure to avoid extremes.

## 2014-09-11 NOTE — Assessment & Plan Note (Signed)
Increased neck pain.  Increased pain with rotation of her head.  Started two days ago.  Tylenol as directed.  Has hydrocodone.  Hold on xray or further w/up.  Follow.

## 2014-09-11 NOTE — Assessment & Plan Note (Signed)
Appears to be in SR.  EKG SR.  Follow.  On coumadin.

## 2014-09-11 NOTE — Assessment & Plan Note (Signed)
Follow metabolic panel and [id number].

## 2014-09-11 NOTE — Progress Notes (Signed)
Subjective:    Patient ID: Betty [name], female    DOB: 1946/06/22, 68 y.o.   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  Is followed by Dr Rosalita Chessman (rheumatology - [facility]).  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  She spent 4.5-5 months in rehab.  She is able to walk and works part time.  She also has a history of atrial fib and is s/p two ablations.  On coumadin. Has a history of DVT as well.  She also has a history of hypertension, diabetes, urinary incontinence and depression.  She comes in today as a work in to follow up abnormal EKG.  Is planning for cataract surgery next week.  Received EKG.  Reviewed and had her come in for f/u EKG.  EKG here revealed SR with no acute ischemic changes. She is currently asymptomatic.  No chest pain or tightness.  No sob with exertion.  She is having some increased pain in her neck.  Present x 2 days.  Rotating her head aggravates.  No known injury.   Past Medical History  Diagnosis Date  . Headache(784.0)   . Backache, unspecified   . Herpes zoster without mention of complication   . Osteoarthrosis, unspecified whether generalized or localized, unspecified site   . Nontoxic uninodular goiter   . Obesity, unspecified   . Esophageal reflux   . Unspecified sleep apnea   . Atrial fibrillation   . Acute diastolic heart failure   . Cardiomegaly   . Hypertension     heart controlled w CHF  . Asthma   . Diabetes mellitus without complication   . Allergy   . Hx: UTI (urinary tract infection)   . Urine incontinence     hx of  . ANCA-associated vasculitis     Outpatient Encounter Prescriptions as of 09/09/2014  Medication Sig  . azaTHIOprine (IMURAN) 50 MG tablet Take 100 mg by mouth daily.  . calcium-vitamin D (OSCAL WITH D) 500-200 MG-UNIT per tablet Take 1 tablet by mouth daily.  . ciprofloxacin (CIPRO) 250 MG tablet Take 1 tablet (250 mg total) by mouth 2 (two)  times daily.  Marland Kitchen diltiazem (CARDIZEM CD) 240 MG 24 hr capsule TAKE ONE CAPSULE BY MOUTH DAILY  . diltiazem (TIAZAC) 240 MG 24 hr capsule Take 240 mg by mouth daily.  . furosemide (LASIX) 40 MG tablet Take 1 tablet (40 mg total) by mouth daily.  Marland Kitchen gabapentin (NEURONTIN) 600 MG tablet TAKE 1 TABLET (600 MG TOTAL) BY MOUTH 2 (TWO) TIMES DAILY.  Marland Kitchen glipiZIDE (GLUCOTROL) 10 MG tablet Take 10 mg by mouth daily.  Marland Kitchen HYDROcodone-acetaminophen (NORCO) 5-325 MG per tablet Take 1 tablet by mouth daily as needed.  Marland Kitchen losartan (COZAAR) 50 MG tablet TAKE 1 TABLET BY MOUTH DAILY  . metFORMIN (GLUCOPHAGE) 1000 MG tablet TAKE 1 TABLET BY MOUTH TWICE A DAY  . omeprazole (PRILOSEC) 40 MG capsule TAKE ONE CAPSULE AT BEDTIME  . pramoxine-hydrocortisone (ANALPRAM-HC) 1-1 % rectal cream Place 1 application rectally 2 (two) times daily.  . traZODone (DESYREL) 100 MG tablet Take 100 mg by mouth 2 (two) times daily.  Marland Kitchen triamterene-hydrochlorothiazide (DYAZIDE) 37.5-25 MG per capsule Take 1 each (1 capsule total) by mouth daily.  Marland Kitchen venlafaxine XR (EFFEXOR XR) 150 MG 24 hr capsule Take 1 capsule (150 mg total) by mouth daily with breakfast.  . warfarin (COUMADIN) 1 MG tablet TAKE AS  DIRECTED  . warfarin (COUMADIN) 4 MG tablet TAKE 1 TABLET BY MOUTH EVERY DAY  . warfarin (COUMADIN) 5 MG tablet Take 1 tablet (5 mg total) by mouth daily. Take as directed    Review of Systems Patient denies any headache, lightheadedness or dizziness.  No significant sinus or allergy symptoms currently.  No chest pain, tightness or palpitations.  No increased shortness of breath, cough or congestion.  No acid reflux.  Controlled.  No nausea or vomiting.  No abdominal pain or cramping.  No bowel change, such as diarrhea, constipation, BRBPR or melana.   Also has sleep apnea.  Uses CPAP.  States she is up to date with her colonoscopy.  Last 2013.  Has a history of colon polyps.   Sees rheumatology regularly.  Neck pain as outlined.  Hurts to rotate  her head.           Objective:   Physical Exam  Filed Vitals:   09/09/14 1454  BP: 120/70  Pulse: 72  Temp: 98.3 F (56.25 C)   68 year old female in no acute distress.   HEENT:  Nares- clear.  Oropharynx - without lesions. NECK:  Supple.  Nontender.  No audible bruit.  HEART:  Appears to be regular. LUNGS:  No crackles or wheezing audible.  Respirations even and unlabored.  RADIAL PULSE:  Equal bilaterally.  ABDOMEN:  Soft, nontender.  Bowel sounds present and normal.  No audible abdominal bruit. EXTREMITIES:  No increased edema present.      FEET:  No lesions.  MSK:  Neck pain with rotation of her head.  No shoulder pain.       Assessment & Plan:  HEALTH MAINTENANCE.  Physical 08/27/13.    Has had a hysterectomy, but had suspicious cells with her paps.  Pelvic/pap at physical negative with negative HPV.    States up to date with colonoscopy.  Last 2013.  Mammogram 03/07/14 - Birads I.

## 2014-09-12 ENCOUNTER — Encounter: Payer: Self-pay | Admitting: *Deleted

## 2014-09-13 ENCOUNTER — Ambulatory Visit: Payer: Self-pay | Admitting: Ophthalmology

## 2014-09-18 ENCOUNTER — Other Ambulatory Visit: Payer: Self-pay | Admitting: Internal Medicine

## 2014-09-19 ENCOUNTER — Other Ambulatory Visit (INDEPENDENT_AMBULATORY_CARE_PROVIDER_SITE_OTHER): Payer: Medicare Other

## 2014-09-19 DIAGNOSIS — Z7901 Long term (current) use of anticoagulants: Secondary | ICD-10-CM

## 2014-09-19 LAB — PROTIME-INR
INR: 2.6 ratio — ABNORMAL HIGH (ref 0.8–1.0)
Prothrombin Time: 27.7 s — ABNORMAL HIGH (ref 9.6–13.1)

## 2014-09-20 ENCOUNTER — Encounter: Payer: Self-pay | Admitting: *Deleted

## 2014-09-21 NOTE — Telephone Encounter (Signed)
Pt notified of unread mychart message. She states that she take 5mg  every day except Tuesday & Saturday. She takes 6mg  on those 2 days. Pt coming in tomorrow (09/22/14) to recheck PT/INR

## 2014-09-21 NOTE — Telephone Encounter (Signed)
Noted  

## 2014-09-22 ENCOUNTER — Other Ambulatory Visit (INDEPENDENT_AMBULATORY_CARE_PROVIDER_SITE_OTHER): Payer: Medicare Other

## 2014-09-22 DIAGNOSIS — Z7901 Long term (current) use of anticoagulants: Secondary | ICD-10-CM

## 2014-09-22 LAB — PROTIME-INR
INR: 2.3 ratio — AB (ref 0.8–1.0)
PROTHROMBIN TIME: 24.7 s — AB (ref 9.6–13.1)

## 2014-09-23 ENCOUNTER — Encounter: Payer: Self-pay | Admitting: *Deleted

## 2014-09-25 ENCOUNTER — Encounter: Payer: Self-pay | Admitting: Internal Medicine

## 2014-09-26 NOTE — Telephone Encounter (Signed)
States she takes 2 at night, historical med. Ok to refill?

## 2014-09-26 NOTE — Telephone Encounter (Signed)
Please change directions to reflect taking at night.  Please confirm how long pt has been on 100mg  (2 q hs).  Then will see about refill.

## 2014-09-27 ENCOUNTER — Encounter: Payer: Self-pay | Admitting: Internal Medicine

## 2014-09-27 ENCOUNTER — Ambulatory Visit: Payer: Self-pay | Admitting: Ophthalmology

## 2014-09-27 LAB — POTASSIUM: Potassium: 3.7 mmol/L (ref 3.5–5.1)

## 2014-09-27 MED ORDER — TRAZODONE HCL 100 MG PO TABS: ORAL_TABLET | ORAL | Status: DC

## 2014-09-27 NOTE — Telephone Encounter (Signed)
Notify pt that I would like to decrease her trazodone to 150mg  q hs.  She can take 1 1/2 of 100mg  tablet.  Given her other medications, the dose needs to be reduced to 150mg  q hs.  rx sent in for 150mg  q hs.

## 2014-09-28 ENCOUNTER — Telehealth: Payer: Self-pay | Admitting: Internal Medicine

## 2014-09-28 NOTE — Telephone Encounter (Signed)
Pt notified potassium ok via my chart.

## 2014-10-02 ENCOUNTER — Other Ambulatory Visit: Payer: Self-pay | Admitting: Internal Medicine

## 2014-10-03 ENCOUNTER — Other Ambulatory Visit: Payer: Self-pay | Admitting: *Deleted

## 2014-10-03 ENCOUNTER — Other Ambulatory Visit (INDEPENDENT_AMBULATORY_CARE_PROVIDER_SITE_OTHER): Payer: Medicare Other

## 2014-10-03 DIAGNOSIS — Z7901 Long term (current) use of anticoagulants: Secondary | ICD-10-CM

## 2014-10-03 LAB — PROTIME-INR
INR: 2.3 ratio — AB (ref 0.8–1.0)
Prothrombin Time: 24.5 s — ABNORMAL HIGH (ref 9.6–13.1)

## 2014-10-04 ENCOUNTER — Ambulatory Visit: Payer: Self-pay | Admitting: Ophthalmology

## 2014-10-04 ENCOUNTER — Encounter: Payer: Self-pay | Admitting: *Deleted

## 2014-10-05 ENCOUNTER — Other Ambulatory Visit: Payer: Self-pay | Admitting: Internal Medicine

## 2014-10-18 ENCOUNTER — Other Ambulatory Visit (INDEPENDENT_AMBULATORY_CARE_PROVIDER_SITE_OTHER): Payer: Medicare Other

## 2014-10-18 DIAGNOSIS — Z7901 Long term (current) use of anticoagulants: Secondary | ICD-10-CM

## 2014-10-19 ENCOUNTER — Encounter: Payer: Self-pay | Admitting: *Deleted

## 2014-10-19 LAB — PROTIME-INR
INR: 2.37 — ABNORMAL HIGH (ref <1.50)
PROTHROMBIN TIME: 25.9 s — AB (ref 11.6–15.2)

## 2014-10-20 ENCOUNTER — Telehealth: Payer: Self-pay | Admitting: Internal Medicine

## 2014-10-20 NOTE — Telephone Encounter (Signed)
left msg to call office to reschedule appt 10/29.msn

## 2014-10-23 ENCOUNTER — Other Ambulatory Visit: Payer: Self-pay | Admitting: Internal Medicine

## 2014-11-01 ENCOUNTER — Other Ambulatory Visit: Payer: Self-pay | Admitting: Internal Medicine

## 2014-11-09 ENCOUNTER — Other Ambulatory Visit (INDEPENDENT_AMBULATORY_CARE_PROVIDER_SITE_OTHER): Payer: Medicare Other

## 2014-11-09 ENCOUNTER — Telehealth: Payer: Self-pay | Admitting: *Deleted

## 2014-11-09 DIAGNOSIS — Z7901 Long term (current) use of anticoagulants: Secondary | ICD-10-CM

## 2014-11-09 LAB — PROTIME-INR
INR: 2.4 ratio — ABNORMAL HIGH (ref 0.8–1.0)
Prothrombin Time: 26.1 s — ABNORMAL HIGH (ref 9.6–13.1)

## 2014-11-09 NOTE — Telephone Encounter (Signed)
See message from [place].  Please forward ultrasound to [facility].   Thanks.

## 2014-11-09 NOTE — Telephone Encounter (Signed)
Pt would like liver ultrasound send to [facility]( Dr.Kennan) she will be get another ultrasound Dec 6th

## 2014-11-10 ENCOUNTER — Encounter: Payer: Self-pay | Admitting: *Deleted

## 2014-11-11 NOTE — Telephone Encounter (Signed)
Ultrasound faxed.

## 2014-11-18 ENCOUNTER — Encounter: Payer: Medicare Other | Admitting: Internal Medicine

## 2014-11-20 ENCOUNTER — Other Ambulatory Visit: Payer: Self-pay | Admitting: Internal Medicine

## 2014-11-21 ENCOUNTER — Other Ambulatory Visit: Payer: Self-pay | Admitting: Internal Medicine

## 2014-12-01 ENCOUNTER — Other Ambulatory Visit: Payer: Self-pay | Admitting: Internal Medicine

## 2014-12-08 ENCOUNTER — Other Ambulatory Visit: Payer: Medicare Other

## 2014-12-08 DIAGNOSIS — Z7901 Long term (current) use of anticoagulants: Secondary | ICD-10-CM

## 2014-12-08 LAB — PROTIME-INR
INR: 2.84 — AB (ref <1.50)
Prothrombin Time: 29.8 seconds — ABNORMAL HIGH (ref 11.6–15.2)

## 2014-12-09 ENCOUNTER — Encounter: Payer: Self-pay | Admitting: *Deleted

## 2014-12-12 ENCOUNTER — Other Ambulatory Visit: Payer: Self-pay | Admitting: Internal Medicine

## 2014-12-12 NOTE — Telephone Encounter (Signed)
Left message for pt to return my call.

## 2014-12-14 ENCOUNTER — Encounter: Payer: Self-pay | Admitting: *Deleted

## 2014-12-14 ENCOUNTER — Other Ambulatory Visit: Payer: Medicare Other

## 2014-12-14 DIAGNOSIS — Z7901 Long term (current) use of anticoagulants: Secondary | ICD-10-CM

## 2014-12-15 LAB — PROTIME-INR
INR: 2.57 — ABNORMAL HIGH (ref <1.50)
Prothrombin Time: 27.6 seconds — ABNORMAL HIGH (ref 11.6–15.2)

## 2014-12-23 ENCOUNTER — Other Ambulatory Visit: Payer: Self-pay | Admitting: Internal Medicine

## 2014-12-29 ENCOUNTER — Other Ambulatory Visit: Payer: Medicare Other

## 2014-12-30 ENCOUNTER — Ambulatory Visit (INDEPENDENT_AMBULATORY_CARE_PROVIDER_SITE_OTHER): Payer: Medicare Other | Admitting: Internal Medicine

## 2014-12-30 ENCOUNTER — Encounter: Payer: Self-pay | Admitting: Internal Medicine

## 2014-12-30 VITALS — BP 104/65 | HR 70 | Temp 98.3°F | Ht 68.5 in | Wt 241.5 lb

## 2014-12-30 DIAGNOSIS — Z7901 Long term (current) use of anticoagulants: Secondary | ICD-10-CM

## 2014-12-30 DIAGNOSIS — Z86010 Personal history of colonic polyps: Secondary | ICD-10-CM

## 2014-12-30 DIAGNOSIS — G4733 Obstructive sleep apnea (adult) (pediatric): Secondary | ICD-10-CM

## 2014-12-30 DIAGNOSIS — I7782 Antineutrophilic cytoplasmic antibody (ANCA) vasculitis: Secondary | ICD-10-CM

## 2014-12-30 DIAGNOSIS — I482 Chronic atrial fibrillation, unspecified: Secondary | ICD-10-CM

## 2014-12-30 DIAGNOSIS — Z0000 Encounter for general adult medical examination without abnormal findings: Secondary | ICD-10-CM | POA: Diagnosis not present

## 2014-12-30 DIAGNOSIS — I776 Arteritis, unspecified: Secondary | ICD-10-CM | POA: Diagnosis not present

## 2014-12-30 DIAGNOSIS — F329 Major depressive disorder, single episode, unspecified: Secondary | ICD-10-CM

## 2014-12-30 DIAGNOSIS — F32A Depression, unspecified: Secondary | ICD-10-CM

## 2014-12-30 DIAGNOSIS — R945 Abnormal results of liver function studies: Secondary | ICD-10-CM

## 2014-12-30 DIAGNOSIS — E119 Type 2 diabetes mellitus without complications: Secondary | ICD-10-CM

## 2014-12-30 DIAGNOSIS — K219 Gastro-esophageal reflux disease without esophagitis: Secondary | ICD-10-CM

## 2014-12-30 DIAGNOSIS — E669 Obesity, unspecified: Secondary | ICD-10-CM

## 2014-12-30 DIAGNOSIS — I110 Hypertensive heart disease with heart failure: Secondary | ICD-10-CM

## 2014-12-30 DIAGNOSIS — I509 Heart failure, unspecified: Secondary | ICD-10-CM

## 2014-12-30 DIAGNOSIS — R7989 Other specified abnormal findings of blood chemistry: Secondary | ICD-10-CM

## 2014-12-30 MED ORDER — HYDROCODONE-ACETAMINOPHEN 5-325 MG PO TABS: 1.0000 | ORAL_TABLET | Freq: Every day | ORAL | Status: DC | PRN

## 2014-12-30 NOTE — Progress Notes (Signed)
Pre visit review using our clinic review tool, if applicable. No additional management support is needed unless otherwise documented below in the visit note. 

## 2014-12-31 LAB — PROTIME-INR
INR: 2.15 — ABNORMAL HIGH (ref <1.50)
Prothrombin Time: 24 seconds — ABNORMAL HIGH (ref 11.6–15.2)

## 2015-01-03 ENCOUNTER — Encounter: Payer: Self-pay | Admitting: Internal Medicine

## 2015-01-05 ENCOUNTER — Encounter: Payer: Self-pay | Admitting: Internal Medicine

## 2015-01-07 ENCOUNTER — Encounter: Payer: Self-pay | Admitting: Internal Medicine

## 2015-01-07 DIAGNOSIS — E669 Obesity, unspecified: Secondary | ICD-10-CM | POA: Insufficient documentation

## 2015-01-07 NOTE — Progress Notes (Signed)
Subjective:    Patient ID: Betty [name], female    DOB: June 29, 1946, 69 y.o.   MRN: [id number]  HPI The patient is here for annual Medicare wellness examination and management of other chronic and acute problems.   The risk factors are reflected in the social history.  The roster of all physicians providing medical care to patient -  [facility]  - ophthalmology.   Dr Rosalita Chessman - rheumatology  Activities of daily living:  The patient is 100% independent in all ADLs: dressing, toileting, feeding as well as independent mobility  Home safety : The patient has smoke detectors in the home. She wears seatbelts.  There is no violence in the home.   There is no risks for hepatitis, STDs or HIV. There is no history of blood transfusion. They have no travel history to infectious disease endemic areas of the world.  She has seen her eye doctor in the last year. She admits to slight hearing difficulty with regard to whispered voices.  She does not  have excessive sun exposure. Discussed the need for sun protection: hats, long sleeves and use of sunscreen if there is significant sun exposure.   Diet: the importance of a healthy diet is discussed. She tries to monitor her diet.    The benefits of regular aerobic exercise were discussed. She walks at work.    Depression screen: there are no signs or vegative symptoms of depression- irritability, change in appetite, anhedonia, sadness/tearfullness.  Cognitive assessment: the patient manages all her financial and personal affairs and is actively engaged. She could relate day,date,year and events; recalled 2/3 objects at 3 minutes; performed clock-face test normally.  The following portions of the patient's history were reviewed and updated as appropriate: allergies, current medications, past family history, past medical history,  past surgical history, past social history  and problem list.  Visual acuity was not assessed given that she has  regular follow up with her ophthalmologist. Hearing and body mass index were reviewed.   During the course of the visit the patient was educated and counseled about appropriate screening and preventive services including : fall prevention , diabetes screening, nutrition counseling, colorectal cancer screening, and recommended immunizations.    Other problems addressed:   She reports a knot - top of right foot.  No fall or injury.  No pain.  No change in size.  She also was requesting to increase her effexor.  We discussed this at length.  Explained.to her my desire to refer her to psychiatry given her persistent issues despite her current medications.  She declines.  Will discuss with Dr Rosalita Chessman.  Still some persistent low back pain.    Review of Systems Patient denies any headache, lightheadedness or dizziness.  No sinus or allergy symptoms.  No chest pain, tightness or palpitations. No increased shortness of breath, cough or congestion.  No nausea or vomiting.  No abdominal pain or cramping.  No bowel change, such as diarrhea, constipation, BRBPR or melana.  No urine change.  Knot on her right foot as outlined.  Increased stress.  Feels the need to increase effexor.  See above.  Persistent low back pain.       Objective:   Physical Exam Filed Vitals:   12/30/14 1544  BP: 104/65  Pulse: 70  Temp: 98.3 F (33.64 C)   69 year old female in no acute distress.   HEENT:  Nares- clear.  Oropharynx - without lesions. NECK:  Supple.  Nontender.  No audible bruit.  HEART:  Appears to be regular. LUNGS:  No crackles or wheezing audible.  Respirations even and unlabored.  RADIAL PULSE:  Equal bilaterally.  ABDOMEN:  Soft, nontender.  Bowel sounds present and normal.  No audible abdominal bruit. EXTREMITIES:  No increased edema present.  DP pulses palpable and equal bilaterally.      FEET:  No lesions.       Assessment & Plan:  1. Long term current use of anticoagulant therapy - INR/PT  2.  Chronic atrial fibrillation On coumadin.  Rate controlled.    3. 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  On imuran.  Counts being followed by Dr Rosalita Chessman.    4. 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    5. Obstructive sleep apnea Uses CPAP.   6. Gastroesophageal reflux disease without esophagitis Controlled on current medication regimen.  EGD 02/07/11 - normal esophagus, normal stomach and normal duodenum.   7. Type 2 diabetes mellitus without complication Low carb diet and exercise.  Follow.  Lab Results  Component Value Date   HGBA1C 5.9 08/16/2014   8. Depression Discussed with her today.  She wanted to increase effexor dose.  Wanted to refer her to psychiatry given persistent issues despite her current medication regimen.  She plans to discuss with Dr Rosalita Chessman.    9. History of colonic polyps Colonoscopy 05/11/14 - two polyps in the ascending colon, one polyp in the transverse colon, diverticulosis and internal hemorrhoids.  Pathology revealed tubular adenomatous polyps.  Recommend f/u colonoscopy in 04/2017.    10. Abnormal liver function tests Previous abdominal ultrasound revealed changes c/w fatty liver.  Has seen GI.  Follow.    11. Obesity (BMI 30-39.9) Diet and exercise.    12.  FOOT - KNOT.  She wants to monitor.  Consider xray.    13.  URINARY INCONTINENCE.  Consider starting medication.    HEALTH MAINTENANCE.  Need to schedule her physical.  S/p hysterectomy.  Pelvic/pap at 08/27/13 physical negative with negative HPV.  Colonoscopy as outlined.

## 2015-01-17 ENCOUNTER — Other Ambulatory Visit (INDEPENDENT_AMBULATORY_CARE_PROVIDER_SITE_OTHER): Payer: Medicare Other

## 2015-01-17 DIAGNOSIS — Z7901 Long term (current) use of anticoagulants: Secondary | ICD-10-CM

## 2015-01-18 ENCOUNTER — Other Ambulatory Visit: Payer: Medicare Other

## 2015-01-18 ENCOUNTER — Telehealth: Payer: Self-pay | Admitting: *Deleted

## 2015-01-18 NOTE — Telephone Encounter (Signed)
Our lab called saying the would like another pt.inr sample on this pt, called pt left a voice mail

## 2015-01-19 ENCOUNTER — Other Ambulatory Visit: Payer: Self-pay | Admitting: Internal Medicine

## 2015-01-19 NOTE — Telephone Encounter (Signed)
Pt called back, notified, lab appt scheduled for tomorrow.

## 2015-01-20 ENCOUNTER — Encounter: Payer: Self-pay | Admitting: Internal Medicine

## 2015-01-20 ENCOUNTER — Ambulatory Visit (INDEPENDENT_AMBULATORY_CARE_PROVIDER_SITE_OTHER): Payer: Medicare Other | Admitting: Internal Medicine

## 2015-01-20 ENCOUNTER — Other Ambulatory Visit (INDEPENDENT_AMBULATORY_CARE_PROVIDER_SITE_OTHER): Payer: Medicare Other

## 2015-01-20 VITALS — BP 118/80 | HR 80 | Temp 99.1°F | Ht 68.5 in | Wt 245.5 lb

## 2015-01-20 DIAGNOSIS — R319 Hematuria, unspecified: Secondary | ICD-10-CM

## 2015-01-20 DIAGNOSIS — N390 Urinary tract infection, site not specified: Secondary | ICD-10-CM

## 2015-01-20 DIAGNOSIS — R32 Unspecified urinary incontinence: Secondary | ICD-10-CM

## 2015-01-20 DIAGNOSIS — Z7901 Long term (current) use of anticoagulants: Secondary | ICD-10-CM

## 2015-01-20 LAB — POCT URINALYSIS DIPSTICK
Bilirubin, UA: NEGATIVE
Glucose, UA: NEGATIVE
KETONES UA: NEGATIVE
NITRITE UA: NEGATIVE
PH UA: 7
Protein, UA: NEGATIVE
Spec Grav, UA: 1.015
Urobilinogen, UA: 1

## 2015-01-20 LAB — PROTIME-INR
INR: 2.3 ratio — ABNORMAL HIGH (ref 0.8–1.0)
Prothrombin Time: 24.5 s — ABNORMAL HIGH (ref 9.6–13.1)

## 2015-01-20 MED ORDER — CEPHALEXIN 500 MG PO CAPS: 500.0000 mg | ORAL_CAPSULE | Freq: Three times a day (TID) | ORAL | Status: DC

## 2015-01-20 NOTE — Progress Notes (Signed)
Patient ID: Betty [name], female   DOB: 1946/11/17, 69 y.o.   MRN: [id number]   Subjective:    Patient ID: Betty [name], female    DOB: 04/08/1946, 69 y.o.   MRN: [id number]  HPI  Patient here as a work in with concerns regarding a possible urinary tract infection.  Symptoms started two weeks ago.  Increased nocturia.  Couldn't hold urine.  Dysuria.  No nausea or vomiting.  Eating.    Past Medical History  Diagnosis Date  . Headache(784.0)   . Backache, unspecified   . Herpes zoster without mention of complication   . Osteoarthrosis, unspecified whether generalized or localized, unspecified site   . Nontoxic uninodular goiter   . Obesity, unspecified   . Esophageal reflux   . Unspecified sleep apnea   . Atrial fibrillation   . Acute diastolic heart failure   . Cardiomegaly   . Hypertension     heart controlled w CHF  . Asthma   . Diabetes mellitus without complication   . Allergy   . Hx: UTI (urinary tract infection)   . Urine incontinence     hx of  . ANCA-associated vasculitis     Review of Systems  Gastrointestinal: Negative for nausea, vomiting and abdominal pain.  Genitourinary: Positive for dysuria and frequency. Negative for hematuria and vaginal discharge.  Musculoskeletal: Negative for back pain.       Objective:    Physical Exam  Cardiovascular: Normal rate and regular rhythm.   Pulmonary/Chest: Breath sounds normal. No respiratory distress. She has no wheezes.  Abdominal: Soft. Bowel sounds are normal. There is no tenderness.  Musculoskeletal: She exhibits no tenderness (no back tenderness or CVA tenderness).    BP 118/80 mmHg  Pulse 80  Temp(Src) 99.1 F (37.3 C) (Oral)  Ht 5' 8.5" (1.74 m)  Wt 245 lb 8 oz (111.358 kg)  BMI 36.78 kg/m2  SpO2 96% Wt Readings from Last 3 Encounters:  01/20/15 245 lb 8 oz (111.358 kg)  12/30/14 241 lb 8 oz (109.544 kg)  09/09/14 242 lb 8 oz (109.997 kg)     Lab Results  Component Value Date   WBC 8.1  08/16/2014   HGB 11.3* 08/16/2014   HCT 34.7* 08/16/2014   PLT 336.0 08/16/2014   GLUCOSE 106* 09/09/2014   CHOL 150 08/16/2014   TRIG 85.0 08/16/2014   HDL 36.30* 08/16/2014   LDLCALC 97 08/16/2014   ALT 25 08/16/2014   AST 37 08/16/2014   NA 139 09/09/2014   K 3.6 09/09/2014   CL 99 09/09/2014   CREATININE 0.7 09/09/2014   BUN 15 09/09/2014   CO2 30 09/09/2014   TSH 1.56 01/03/2014   INR 2.3* 01/17/2015   HGBA1C 5.9 08/16/2014   MICROALBUR 0.2 01/03/2014       Assessment & Plan:   Problem List Items Addressed This Visit    Urinary incontinence    After infection cleared, she wants to try a medication for overactive bladder.         Other Visit Diagnoses    Urinary tract infection with hematuria, site unspecified    -  Primary    urinary symptoms as outlined.  urine dip reviewed.  send for culture.  after reviewing previous culture and since on coumadin, treat with keflex as directed.      Relevant Medications    cephALEXin (KEFLEX) capsule    Other Relevant Orders    POCT urinalysis dipstick (Completed)    Urine culture  Einar Pheasant, MD

## 2015-01-20 NOTE — Progress Notes (Signed)
Pre visit review using our clinic review tool, if applicable. No additional management support is needed unless otherwise documented below in the visit note. 

## 2015-01-22 ENCOUNTER — Encounter: Payer: Self-pay | Admitting: Internal Medicine

## 2015-01-22 NOTE — Assessment & Plan Note (Signed)
After infection cleared, she wants to try a medication for overactive bladder.

## 2015-01-24 ENCOUNTER — Encounter: Payer: Self-pay | Admitting: *Deleted

## 2015-01-24 LAB — URINE CULTURE

## 2015-01-26 ENCOUNTER — Telehealth: Payer: Self-pay | Admitting: *Deleted

## 2015-01-26 MED ORDER — CEPHALEXIN 500 MG PO CAPS: 500.0000 mg | ORAL_CAPSULE | Freq: Three times a day (TID) | ORAL | Status: DC

## 2015-01-26 NOTE — Telephone Encounter (Signed)
If better but not completely resolved, can extend keflex for 5 more days.

## 2015-01-26 NOTE — Telephone Encounter (Signed)
Left message, notifying pt. Rx sent to pharmacy by escript. Requested call back with failure of improvement after abx.

## 2015-01-26 NOTE — Telephone Encounter (Signed)
Pt completed Keflex for UTI last night, symptoms have improved but have not completely resolved, still having urgency and slight dysuria. Please advise.  Uses [facility]. May leave message at call back number if she doesn't answer.

## 2015-01-29 ENCOUNTER — Other Ambulatory Visit: Payer: Self-pay | Admitting: Internal Medicine

## 2015-01-30 ENCOUNTER — Encounter: Payer: Self-pay | Admitting: Internal Medicine

## 2015-02-01 ENCOUNTER — Other Ambulatory Visit: Payer: Medicare Other

## 2015-02-01 ENCOUNTER — Telehealth: Payer: Self-pay | Admitting: Internal Medicine

## 2015-02-01 DIAGNOSIS — Z7901 Long term (current) use of anticoagulants: Secondary | ICD-10-CM

## 2015-02-01 NOTE — Telephone Encounter (Signed)
Noted.  If any acute change in symptoms or persistent problems, does need to be evaluated.

## 2015-02-01 NOTE — Telephone Encounter (Signed)
FYI

## 2015-02-01 NOTE — Telephone Encounter (Signed)
No answer, had to leave message for pt to return call to schedule appoint if having any acute change in symptoms or persistent problems.

## 2015-02-01 NOTE — Telephone Encounter (Signed)
Initial Comment Caller states she had a UTI- she was put on antibiotics, than another round of antibiotics, now yeast infection. She used the medication- wants to know if it's normal to burn and itch. Nurse Assessment Nurse: Ronnald Ramp, RN, Miranda Date/Time (Eastern Time): 02/01/2015 11:15:36 AM Confirm and document reason for call. If symptomatic, describe symptoms. ---Caller states she was on antibiotics for UTI and then developed a yeast infection. Her doctor told her to use OTC Monistat 3. She used the medication last night but it caused severe itching and burning for about 15 min. She wants to know if that is normal. Still has minor itching today but improved. Has the patient traveled out of the country within the last 30 days? ---Not Applicable Does the patient require triage? ---Yes Related visit to physician within the last 2 weeks? ---N/A Does the PT have any chronic conditions? (i.e. diabetes, asthma, etc.) ---Unknown Guidelines Guideline Title Affirmed Question Affirmed Notes Vaginal Discharge Symptoms of a vaginal yeast infection (i.e., white, thick, cottage-cheese-like, itchy, not bad smelling discharge) Final Disposition User Satartia, RN, Miranda Comments Told caller itching and burning can be normal with medication, however if they are severe after dose tonight, told caller to call back to discuss other options. Also suggest Vagisil for daytime itching.

## 2015-02-02 ENCOUNTER — Encounter: Payer: Self-pay | Admitting: *Deleted

## 2015-02-02 ENCOUNTER — Other Ambulatory Visit: Payer: Medicare Other

## 2015-02-02 LAB — PROTIME-INR
INR: 2.22 — ABNORMAL HIGH (ref <1.50)
PROTHROMBIN TIME: 24.6 s — AB (ref 11.6–15.2)

## 2015-02-05 ENCOUNTER — Other Ambulatory Visit: Payer: Self-pay | Admitting: Internal Medicine

## 2015-02-10 ENCOUNTER — Encounter: Payer: Self-pay | Admitting: Internal Medicine

## 2015-02-13 ENCOUNTER — Other Ambulatory Visit: Payer: Self-pay | Admitting: Internal Medicine

## 2015-02-13 DIAGNOSIS — D649 Anemia, unspecified: Secondary | ICD-10-CM

## 2015-02-13 DIAGNOSIS — E559 Vitamin D deficiency, unspecified: Secondary | ICD-10-CM

## 2015-02-13 DIAGNOSIS — I7782 Antineutrophilic cytoplasmic antibody (ANCA) vasculitis: Secondary | ICD-10-CM

## 2015-02-13 DIAGNOSIS — R945 Abnormal results of liver function studies: Secondary | ICD-10-CM

## 2015-02-13 DIAGNOSIS — E119 Type 2 diabetes mellitus without complications: Secondary | ICD-10-CM

## 2015-02-13 DIAGNOSIS — I776 Arteritis, unspecified: Secondary | ICD-10-CM

## 2015-02-13 DIAGNOSIS — R7989 Other specified abnormal findings of blood chemistry: Secondary | ICD-10-CM

## 2015-02-13 DIAGNOSIS — I482 Chronic atrial fibrillation, unspecified: Secondary | ICD-10-CM

## 2015-02-13 DIAGNOSIS — E041 Nontoxic single thyroid nodule: Secondary | ICD-10-CM

## 2015-02-13 NOTE — Telephone Encounter (Signed)
I took a look through medication history and this medication is not even in her chart.

## 2015-02-13 NOTE — Progress Notes (Signed)
Order placed for labs.

## 2015-02-13 NOTE — Telephone Encounter (Signed)
Advise as to refill.

## 2015-02-13 NOTE — Telephone Encounter (Signed)
Noted  

## 2015-02-13 NOTE — Telephone Encounter (Signed)
Who has been refilling this medication.  It does not appear that we have been refilling.  Before I refill, I would like to check her vitamin D level and see if still needs this high of a dose.  We will check with her pt/inr on 02/17/15.   Thanks.

## 2015-02-15 ENCOUNTER — Other Ambulatory Visit: Payer: Self-pay | Admitting: Internal Medicine

## 2015-02-16 ENCOUNTER — Other Ambulatory Visit: Payer: Medicare Other

## 2015-02-17 ENCOUNTER — Other Ambulatory Visit (INDEPENDENT_AMBULATORY_CARE_PROVIDER_SITE_OTHER): Payer: Medicare Other

## 2015-02-17 DIAGNOSIS — Z7901 Long term (current) use of anticoagulants: Secondary | ICD-10-CM

## 2015-02-17 LAB — PROTIME-INR
INR: 2.5 ratio — ABNORMAL HIGH (ref 0.8–1.0)
Prothrombin Time: 27.1 s — ABNORMAL HIGH (ref 9.6–13.1)

## 2015-02-17 NOTE — Telephone Encounter (Signed)
Pt states both her "pointer fingers" are locking up bad, her right one is the worse, would like to know what she can do?

## 2015-02-19 ENCOUNTER — Other Ambulatory Visit: Payer: Self-pay | Admitting: Internal Medicine

## 2015-02-19 NOTE — Telephone Encounter (Signed)
Message sent back to Betty [name] regarding her finger issues.  Refer back to hematology.

## 2015-02-20 ENCOUNTER — Encounter: Payer: Self-pay | Admitting: *Deleted

## 2015-02-20 ENCOUNTER — Other Ambulatory Visit: Payer: Self-pay | Admitting: Internal Medicine

## 2015-02-20 DIAGNOSIS — E119 Type 2 diabetes mellitus without complications: Secondary | ICD-10-CM

## 2015-02-20 NOTE — Progress Notes (Signed)
Order placed for cholesterol check.

## 2015-03-02 ENCOUNTER — Other Ambulatory Visit: Payer: Self-pay | Admitting: *Deleted

## 2015-03-02 ENCOUNTER — Telehealth: Payer: Self-pay | Admitting: Internal Medicine

## 2015-03-02 ENCOUNTER — Encounter: Payer: Self-pay | Admitting: *Deleted

## 2015-03-02 MED ORDER — SOLIFENACIN SUCCINATE 5 MG PO TABS: 5.0000 mg | ORAL_TABLET | Freq: Every day | ORAL | Status: DC

## 2015-03-02 NOTE — Telephone Encounter (Signed)
Sent patient a Therapist, music & sent Rx to pharmacy

## 2015-03-02 NOTE — Telephone Encounter (Signed)
I would keep her routine medications as is for now.  Regarding her bladder, confirm she has not tried vesicare.  If no, then start vesicare 5mg  q day.  Will follow.

## 2015-03-02 NOTE — Telephone Encounter (Signed)
Needing medication for her over active bladder. She is on 2 diuretic pills and she wants to know should she take both. She has an appointment with Dr. Richardson Landry due to a problem with her thyroid.

## 2015-03-07 ENCOUNTER — Other Ambulatory Visit (INDEPENDENT_AMBULATORY_CARE_PROVIDER_SITE_OTHER): Payer: Medicare Other

## 2015-03-07 DIAGNOSIS — E119 Type 2 diabetes mellitus without complications: Secondary | ICD-10-CM

## 2015-03-07 DIAGNOSIS — I482 Chronic atrial fibrillation, unspecified: Secondary | ICD-10-CM

## 2015-03-07 DIAGNOSIS — E041 Nontoxic single thyroid nodule: Secondary | ICD-10-CM

## 2015-03-07 DIAGNOSIS — I776 Arteritis, unspecified: Secondary | ICD-10-CM

## 2015-03-07 DIAGNOSIS — I7782 Antineutrophilic cytoplasmic antibody (ANCA) vasculitis: Secondary | ICD-10-CM

## 2015-03-07 DIAGNOSIS — D649 Anemia, unspecified: Secondary | ICD-10-CM

## 2015-03-07 DIAGNOSIS — R7989 Other specified abnormal findings of blood chemistry: Secondary | ICD-10-CM

## 2015-03-07 DIAGNOSIS — R945 Abnormal results of liver function studies: Secondary | ICD-10-CM

## 2015-03-07 DIAGNOSIS — E559 Vitamin D deficiency, unspecified: Secondary | ICD-10-CM

## 2015-03-07 LAB — BASIC METABOLIC PANEL
BUN: 18 mg/dL (ref 6–23)
CALCIUM: 9 mg/dL (ref 8.4–10.5)
CHLORIDE: 97 meq/L (ref 96–112)
CO2: 33 mEq/L — ABNORMAL HIGH (ref 19–32)
CREATININE: 0.69 mg/dL (ref 0.40–1.20)
GFR: 89.86 mL/min (ref >60.00)
GLUCOSE: 133 mg/dL — AB (ref 70–99)
POTASSIUM: 3.7 meq/L (ref 3.5–5.1)
Sodium: 137 mEq/L (ref 135–145)

## 2015-03-07 LAB — FERRITIN: Ferritin: 22.6 ng/mL (ref 10.0–291.0)

## 2015-03-07 LAB — HEPATIC FUNCTION PANEL
ALBUMIN: 4 g/dL (ref 3.5–5.2)
ALK PHOS: 64 U/L (ref 39–117)
ALT: 82 U/L — ABNORMAL HIGH (ref 0–35)
AST: 142 U/L — ABNORMAL HIGH (ref 0–37)
BILIRUBIN DIRECT: 0.2 mg/dL (ref 0.0–0.3)
BILIRUBIN TOTAL: 0.4 mg/dL (ref 0.2–1.2)
Total Protein: 6.6 g/dL (ref 6.0–8.3)

## 2015-03-07 LAB — PROTIME-INR
INR: 2.5 ratio — AB (ref 0.8–1.0)
Prothrombin Time: 27.4 s — ABNORMAL HIGH (ref 9.6–13.1)

## 2015-03-07 LAB — CBC WITH DIFFERENTIAL/PLATELET
Basophils Absolute: 0 10*3/uL (ref 0.0–0.1)
Basophils Relative: 0.2 % (ref 0.0–3.0)
EOS ABS: 0.2 10*3/uL (ref 0.0–0.7)
Eosinophils Relative: 1.9 % (ref 0.0–5.0)
HCT: 31.4 % — ABNORMAL LOW (ref 36.0–46.0)
Hemoglobin: 10.2 g/dL — ABNORMAL LOW (ref 12.0–15.0)
Lymphocytes Relative: 7.6 % — ABNORMAL LOW (ref 12.0–46.0)
Lymphs Abs: 0.7 10*3/uL (ref 0.7–4.0)
MCHC: 32.5 g/dL (ref 30.0–36.0)
MCV: 76.3 fl — AB (ref 78.0–100.0)
MONO ABS: 0.6 10*3/uL (ref 0.1–1.0)
Monocytes Relative: 7.1 % (ref 3.0–12.0)
Neutro Abs: 7.4 10*3/uL (ref 1.4–7.7)
Neutrophils Relative %: 83.2 % — ABNORMAL HIGH (ref 43.0–77.0)
PLATELETS: 346 10*3/uL (ref 150.0–400.0)
RBC: 4.12 Mil/uL (ref 3.87–5.11)
RDW: 17 % — ABNORMAL HIGH (ref 11.5–15.5)
WBC: 8.9 10*3/uL (ref 4.0–10.5)

## 2015-03-07 LAB — LIPID PANEL
CHOLESTEROL: 153 mg/dL (ref 0–200)
HDL: 36.6 mg/dL — AB (ref >39.00)
LDL CALC: 90 mg/dL (ref 0–99)
NonHDL: 116.4
Total CHOL/HDL Ratio: 4
Triglycerides: 130 mg/dL (ref 0.0–149.0)
VLDL: 26 mg/dL (ref 0.0–40.0)

## 2015-03-07 LAB — HEMOGLOBIN A1C: Hgb A1c MFr Bld: 6.7 % — ABNORMAL HIGH (ref 4.6–6.5)

## 2015-03-07 LAB — VITAMIN D 25 HYDROXY (VIT D DEFICIENCY, FRACTURES): VITD: 37.17 ng/mL (ref 30.00–100.00)

## 2015-03-07 LAB — TSH: TSH: 2.84 u[IU]/mL (ref 0.35–4.50)

## 2015-03-08 ENCOUNTER — Other Ambulatory Visit: Payer: Self-pay | Admitting: Internal Medicine

## 2015-03-08 LAB — MICROALBUMIN / CREATININE URINE RATIO
Creatinine,U: 41.7 mg/dL
MICROALB/CREAT RATIO: 1.7 mg/g (ref 0.0–30.0)

## 2015-03-09 ENCOUNTER — Other Ambulatory Visit: Payer: Self-pay | Admitting: Internal Medicine

## 2015-03-09 ENCOUNTER — Encounter: Payer: Self-pay | Admitting: *Deleted

## 2015-03-09 DIAGNOSIS — D649 Anemia, unspecified: Secondary | ICD-10-CM

## 2015-03-09 DIAGNOSIS — R7989 Other specified abnormal findings of blood chemistry: Secondary | ICD-10-CM

## 2015-03-09 DIAGNOSIS — R945 Abnormal results of liver function studies: Secondary | ICD-10-CM

## 2015-03-09 NOTE — Progress Notes (Signed)
Order placed for f/u labs.  

## 2015-03-15 ENCOUNTER — Other Ambulatory Visit (INDEPENDENT_AMBULATORY_CARE_PROVIDER_SITE_OTHER): Payer: Medicare Other

## 2015-03-15 DIAGNOSIS — R945 Abnormal results of liver function studies: Secondary | ICD-10-CM

## 2015-03-15 DIAGNOSIS — D649 Anemia, unspecified: Secondary | ICD-10-CM

## 2015-03-15 DIAGNOSIS — R7989 Other specified abnormal findings of blood chemistry: Secondary | ICD-10-CM | POA: Diagnosis not present

## 2015-03-15 DIAGNOSIS — Z79899 Other long term (current) drug therapy: Secondary | ICD-10-CM | POA: Diagnosis not present

## 2015-03-15 LAB — CBC WITH DIFFERENTIAL/PLATELET
BASOS ABS: 0 10*3/uL (ref 0.0–0.1)
Basophils Relative: 0.3 % (ref 0.0–3.0)
Eosinophils Absolute: 0.2 10*3/uL (ref 0.0–0.7)
Eosinophils Relative: 2.4 % (ref 0.0–5.0)
HCT: 32.6 % — ABNORMAL LOW (ref 36.0–46.0)
HEMOGLOBIN: 10.6 g/dL — AB (ref 12.0–15.0)
Lymphocytes Relative: 14.5 % (ref 12.0–46.0)
Lymphs Abs: 1.3 10*3/uL (ref 0.7–4.0)
MCHC: 32.3 g/dL (ref 30.0–36.0)
MCV: 76.5 fl — ABNORMAL LOW (ref 78.0–100.0)
MONOS PCT: 8.2 % (ref 3.0–12.0)
Monocytes Absolute: 0.8 10*3/uL (ref 0.1–1.0)
NEUTROS ABS: 6.9 10*3/uL (ref 1.4–7.7)
Neutrophils Relative %: 74.6 % (ref 43.0–77.0)
PLATELETS: 392 10*3/uL (ref 150.0–400.0)
RBC: 4.26 Mil/uL (ref 3.87–5.11)
RDW: 16.6 % — ABNORMAL HIGH (ref 11.5–15.5)
WBC: 9.3 10*3/uL (ref 4.0–10.5)

## 2015-03-15 LAB — IBC PANEL
IRON: 26 ug/dL — AB (ref 42–145)
SATURATION RATIOS: 5.6 % — AB (ref 20.0–50.0)
TRANSFERRIN: 334 mg/dL (ref 212.0–360.0)

## 2015-03-15 LAB — HEPATIC FUNCTION PANEL
ALK PHOS: 67 U/L (ref 39–117)
ALT: 40 U/L — AB (ref 0–35)
AST: 47 U/L — ABNORMAL HIGH (ref 0–37)
Albumin: 3.8 g/dL (ref 3.5–5.2)
BILIRUBIN DIRECT: 0 mg/dL (ref 0.0–0.3)
BILIRUBIN TOTAL: 0.2 mg/dL (ref 0.2–1.2)
TOTAL PROTEIN: 6.3 g/dL (ref 6.0–8.3)

## 2015-03-15 LAB — VITAMIN B12: VITAMIN B 12: 459 pg/mL (ref 211–911)

## 2015-03-15 NOTE — Addendum Note (Signed)
Addended by: Johnsie Cancel on: 03/15/2015 02:55 PM   Modules accepted: Orders

## 2015-03-16 ENCOUNTER — Encounter: Payer: Self-pay | Admitting: *Deleted

## 2015-03-16 ENCOUNTER — Telehealth: Payer: Self-pay | Admitting: *Deleted

## 2015-03-16 NOTE — Telephone Encounter (Signed)
Pt stated that she is on Coumadin for a-fib & blood clots. I also notified pt to check mychart in case you wanted to communicate with her any further.

## 2015-03-16 NOTE — Telephone Encounter (Signed)
Please confirm with pt if atrial fib is the only reason she is on coumadin.  Let me know if there is any other reason she is taking coumadin, i,e, DVT, pulmonary embolus, etc.  Also, when is the procedure planned?

## 2015-03-16 NOTE — Telephone Encounter (Signed)
Dr. Richardson Landry wants to schedule patient for an ultrasound guided FNA & wants to know if she can go off of her Coumadin x 5 days. Please advise

## 2015-03-18 ENCOUNTER — Encounter: Payer: Self-pay | Admitting: Internal Medicine

## 2015-03-20 NOTE — Telephone Encounter (Signed)
Spoke with Lovey Newcomer at [facility] & verbal given

## 2015-03-20 NOTE — Telephone Encounter (Signed)
Pt notified.  Aware of risk of being off coumadin.  Ok to stop for five days.  Recommend restarting as soon as she is able.  Please notify ENT.  Thanks

## 2015-03-22 ENCOUNTER — Encounter: Payer: Self-pay | Admitting: Internal Medicine

## 2015-03-22 ENCOUNTER — Other Ambulatory Visit: Payer: Self-pay | Admitting: Internal Medicine

## 2015-03-23 ENCOUNTER — Other Ambulatory Visit: Payer: Self-pay | Admitting: Internal Medicine

## 2015-03-23 NOTE — Telephone Encounter (Signed)
Refilled diflucan - one tablet with no refills.

## 2015-03-23 NOTE — Telephone Encounter (Signed)
Please advise refill? 

## 2015-03-27 ENCOUNTER — Telehealth: Payer: Self-pay

## 2015-03-27 ENCOUNTER — Ambulatory Visit
Admit: 2015-03-27 | Disposition: A | Discharge status: Home / Self Care | Payer: Self-pay | Attending: Otolaryngology | Admitting: Otolaryngology

## 2015-03-27 LAB — PROTIME-INR
INR: 0.9
PROTHROMBIN TIME: 12.5 s

## 2015-03-27 NOTE — Telephone Encounter (Signed)
The patient called and wanted to let Dr.Scott know she had her procedure done this am.  She wanted to let her know in case her coumadin schedule needed to be adjusted.  Thanks!

## 2015-03-27 NOTE — Telephone Encounter (Signed)
Spoke to Betty [name], states she was advised by other office to start back tomorrow night on her Coumadin, her dose 5mg  daily except 6 mg on Tuesday and Saturday. Advised she would need Betty [name]/INR in one week after starting back. Wants to make sure Dr. Nicki Reaper is ok with her not starting Coumadin until tomorrow night.

## 2015-03-27 NOTE — Telephone Encounter (Signed)
Ok to start back when they say ok.  I told her they might want to delay given procedure.

## 2015-03-27 NOTE — Telephone Encounter (Signed)
Did she start back on her coumadin.  If so, then should have restarted the same dose she was previously on and recheck pt/inr in one week.

## 2015-03-27 NOTE — Telephone Encounter (Signed)
Pt notified and  verbalized understanding. Has lab appt already scheduled 04/05/15

## 2015-03-28 ENCOUNTER — Other Ambulatory Visit: Payer: Self-pay | Admitting: Internal Medicine

## 2015-03-31 ENCOUNTER — Telehealth: Payer: Self-pay | Admitting: Internal Medicine

## 2015-03-31 NOTE — Telephone Encounter (Signed)
Pt states that this has been going on several weeks now. And she states that she doesn't feel weak, just feels tired. Patient was wondering if it was related to her recent blood results. Please advise & pt aware to check mychart for response

## 2015-03-31 NOTE — Telephone Encounter (Signed)
If feeling weak and needing to rest with walking.  She needs evaluation.  If acute symptoms - to acute care for evaluation.

## 2015-03-31 NOTE — Telephone Encounter (Signed)
Pt called to stated that feels weak and has to wait a few mins after walking to rest. Please advise pt/msn

## 2015-04-01 NOTE — Telephone Encounter (Signed)
Sent pt my chart message - regarding her questions about her labs and her fatigue.

## 2015-04-03 ENCOUNTER — Encounter: Payer: Self-pay | Admitting: Internal Medicine

## 2015-04-04 ENCOUNTER — Other Ambulatory Visit: Payer: Self-pay | Admitting: Internal Medicine

## 2015-04-05 ENCOUNTER — Other Ambulatory Visit (INDEPENDENT_AMBULATORY_CARE_PROVIDER_SITE_OTHER): Payer: Medicare Other

## 2015-04-05 DIAGNOSIS — Z7901 Long term (current) use of anticoagulants: Secondary | ICD-10-CM | POA: Diagnosis not present

## 2015-04-05 LAB — PROTIME-INR
INR: 1.5 ratio — AB (ref 0.8–1.0)
Prothrombin Time: 16.9 s — ABNORMAL HIGH (ref 9.6–13.1)

## 2015-04-05 MED ORDER — HYDROCORTISONE ACE-PRAMOXINE 1-1 % RE CREA: 1.0000 "application " | TOPICAL_CREAM | Freq: Two times a day (BID) | RECTAL | Status: DC

## 2015-04-05 NOTE — Telephone Encounter (Signed)
ok'd refill analpram x 1

## 2015-04-06 ENCOUNTER — Encounter: Payer: Self-pay | Admitting: *Deleted

## 2015-04-10 ENCOUNTER — Other Ambulatory Visit: Payer: Self-pay | Admitting: Internal Medicine

## 2015-04-11 ENCOUNTER — Encounter: Payer: Self-pay | Admitting: *Deleted

## 2015-04-11 MED ORDER — HYDROCODONE-ACETAMINOPHEN 5-325 MG PO TABS: 1.0000 | ORAL_TABLET | Freq: Every day | ORAL | Status: DC | PRN

## 2015-04-11 NOTE — Telephone Encounter (Signed)
Last refill 12/30/14, last appt 01/20/15

## 2015-04-11 NOTE — Telephone Encounter (Signed)
Refilled hydrocodone #30 with no refills.

## 2015-04-12 ENCOUNTER — Other Ambulatory Visit (INDEPENDENT_AMBULATORY_CARE_PROVIDER_SITE_OTHER): Payer: Medicare Other

## 2015-04-12 DIAGNOSIS — Z7901 Long term (current) use of anticoagulants: Secondary | ICD-10-CM

## 2015-04-12 LAB — PROTIME-INR
INR: 2 ratio — AB (ref 0.8–1.0)
Prothrombin Time: 22 s — ABNORMAL HIGH (ref 9.6–13.1)

## 2015-04-13 ENCOUNTER — Encounter: Payer: Self-pay | Admitting: *Deleted

## 2015-04-13 ENCOUNTER — Other Ambulatory Visit: Payer: Medicare Other

## 2015-04-13 NOTE — Telephone Encounter (Signed)
Rx has been picked up by patient

## 2015-04-15 NOTE — Op Note (Signed)
PATIENT NAME:  Betty [name], MESKILL MR#:  [id number] DATE OF BIRTH:  20-Sep-1946  DATE OF PROCEDURE:  10/04/2014  PREOPERATIVE DIAGNOSIS:  Visually significant cataract of the right eye.   POSTOPERATIVE DIAGNOSIS:  Visually significant cataract of the right eye.   OPERATIVE PROCEDURE:  Cataract extraction by phacoemulsification with implant of intraocular lens to right eye.   SURGEON:  Birder Robson, MD   ANESTHESIA:  1. Managed anesthesia care.  2. Topical tetracaine drops followed by 2% Xylocaine jelly applied in the preoperative holding area.   COMPLICATIONS:  None.   TECHNIQUE:  Stop and chop.   DESCRIPTION OF PROCEDURE:  The patient was examined and consented in the preoperative holding area where the aforementioned topical anesthesia was applied to the right eye and then brought back to the Operating Room where the right eye was prepped and draped in the usual sterile ophthalmic fashion and a lid speculum was placed. A paracentesis was created with the side port blade and the anterior chamber was filled with viscoelastic. A near clear corneal incision was performed with the steel keratome. A continuous curvilinear capsulorrhexis was performed with a cystotome followed by the capsulorrhexis forceps. Hydrodissection and hydrodelineation were carried out with BSS on a blunt cannula. The lens was removed in a stop and chop technique and the remaining cortical material was removed with the irrigation-aspiration handpiece. The capsular bag was inflated with viscoelastic and the Tecnis ZCB00, 19.5-diopter lens, serial number [id number], was placed in the capsular bag without complication. The remaining viscoelastic was removed from the eye with the irrigation-aspiration handpiece. The wounds were hydrated. The anterior chamber was flushed with Miostat and the eye was inflated to physiologic pressure. 0.1 mL of cefuroxime concentration 10 mg/mL was placed in the anterior chamber. The wounds were found to  be water tight. The eye was dressed with Vigamox. The patient was given protective glasses to wear throughout the day and a shield with which to sleep tonight. The patient was also given drops with which to begin a drop regimen today and will follow-up with me in one day.    ____________________________ Livingston Diones. Murle Otting, MD wlp:nb D: 10/04/2014 21:39:48 ET T: 10/04/2014 23:04:51 ET JOB#: [id number]  cc: Korde Jeppsen L. Alika Saladin, MD, <Dictator> Livingston Diones Dorethia Jeanmarie MD ELECTRONICALLY SIGNED 10/05/2014 10:19

## 2015-04-15 NOTE — Op Note (Signed)
PATIENT NAME:  Betty [name], Betty [name] MR#:  [id number] DATE OF BIRTH:  1946-10-25  DATE OF PROCEDURE:  09/13/2014  PREOPERATIVE DIAGNOSIS: Visually significant cataract of the left eye.   POSTOPERATIVE DIAGNOSIS: Visually significant cataract of the left eye.   OPERATIVE PROCEDURE: Cataract extraction by phacoemulsification with implant of intraocular lens to left eye.   SURGEON: Birder Robson, MD.   ANESTHESIA:  1. Managed anesthesia care.  2. Topical tetracaine drops followed by 2% Xylocaine jelly applied in the preoperative holding area.   COMPLICATIONS: None.   TECHNIQUE:  Stop and chop.   DESCRIPTION OF PROCEDURE: The patient was examined and consented in the preoperative holding area where the aforementioned topical anesthesia was applied to the left eye and then brought back to the Operating Room where the left eye was prepped and draped in the usual sterile ophthalmic fashion and a lid speculum was placed. A paracentesis was created with the side port blade and the anterior chamber was filled with viscoelastic. A near clear corneal incision was performed with the steel keratome. A continuous curvilinear capsulorrhexis was performed with a cystotome followed by the capsulorrhexis forceps. Hydrodissection and hydrodelineation were carried out with BSS on a blunt cannula. The lens was removed in a stop and chop technique and the remaining cortical material was removed with the irrigation-aspiration handpiece. The capsular bag was inflated with viscoelastic and the Tecnis ZCB00 19.0-diopter lens, serial number [id number] was placed in the capsular bag without complication. The remaining viscoelastic was removed from the eye with the irrigation-aspiration handpiece. The wounds were hydrated. The anterior chamber was flushed with Miostat and the eye was inflated to physiologic pressure. 0.1 mL of cefuroxime concentration 10 mg/mL was placed in the anterior chamber. The wounds were found to be water  tight. The eye was dressed with Vigamox. The patient was given protective glasses to wear throughout the day and a shield with which to sleep tonight. The patient was also given drops with which to begin a drop regimen today and will follow-up with me in one day.    ____________________________ Livingston Diones. Bobbi Kozakiewicz, MD wlp:lt D: 09/13/2014 21:11:00 ET T: 09/13/2014 22:03:36 ET JOB#: [id number]  cc: Joua Bake L. Edwardine Deschepper, MD, <Dictator> Livingston Diones Arminda Foglio MD ELECTRONICALLY SIGNED 09/14/2014 10:43

## 2015-04-16 ENCOUNTER — Encounter: Payer: Self-pay | Admitting: Internal Medicine

## 2015-04-16 ENCOUNTER — Other Ambulatory Visit: Payer: Self-pay | Admitting: Internal Medicine

## 2015-04-24 ENCOUNTER — Other Ambulatory Visit: Payer: Medicare Other

## 2015-04-26 ENCOUNTER — Telehealth: Payer: Self-pay | Admitting: *Deleted

## 2015-04-26 ENCOUNTER — Other Ambulatory Visit (INDEPENDENT_AMBULATORY_CARE_PROVIDER_SITE_OTHER): Payer: Medicare Other

## 2015-04-26 DIAGNOSIS — Z7901 Long term (current) use of anticoagulants: Secondary | ICD-10-CM | POA: Diagnosis not present

## 2015-04-26 LAB — PROTIME-INR
INR: 2.2 ratio — AB (ref 0.8–1.0)
Prothrombin Time: 23.6 s — ABNORMAL HIGH (ref 9.6–13.1)

## 2015-04-26 NOTE — Telephone Encounter (Signed)
Pt has appt tomorrow with Morey Hummingbird @ 8:45

## 2015-04-26 NOTE — Telephone Encounter (Signed)
If pt fell and having pain - needs to be evaluated.

## 2015-04-26 NOTE — Telephone Encounter (Signed)
Pt states Sunday night she fell off her bed and hit the right side of her face, ear and arm, her back is hurting bad and is sore.

## 2015-04-27 ENCOUNTER — Ambulatory Visit (INDEPENDENT_AMBULATORY_CARE_PROVIDER_SITE_OTHER): Payer: Medicare Other | Admitting: Nurse Practitioner

## 2015-04-27 ENCOUNTER — Encounter: Payer: Self-pay | Admitting: *Deleted

## 2015-04-27 ENCOUNTER — Encounter: Payer: Self-pay | Admitting: Nurse Practitioner

## 2015-04-27 VITALS — BP 133/75 | HR 83 | Temp 98.2°F | Resp 12 | Ht 68.5 in | Wt 251.8 lb

## 2015-04-27 DIAGNOSIS — R58 Hemorrhage, not elsewhere classified: Secondary | ICD-10-CM | POA: Diagnosis not present

## 2015-04-27 MED ORDER — CYCLOBENZAPRINE HCL 5 MG PO TABS: 5.0000 mg | ORAL_TABLET | Freq: Every day | ORAL | Status: DC

## 2015-04-27 NOTE — Progress Notes (Signed)
   Subjective:    Patient ID: Betty [name], female    DOB: 19-Apr-1946, 69 y.o.   MRN: [id number]  HPI  [name] is a 69 yo female with a CC fall Sunday night, bruise on face.   1) Fall on Sunday night. Pain in mid to low back, face and ear tender to touch, denies losing consciousness. [place] out of bed, turned over and didn't realize she didn't have any space, ecchymosis on face, right ear, and right arm,  Right mid back ecchymosis- hematoma  Heat on back- for hematoma Stabbing pain, not sleeping  Asked what is cheaper than Vesicare. Pt did not remember what her pharmacy told her.  Review of Systems  Constitutional: Negative for fever, chills, diaphoresis and fatigue.  Respiratory: Negative for chest tightness, shortness of breath and wheezing.   Cardiovascular: Negative for chest pain, palpitations and leg swelling.  Gastrointestinal: Negative for nausea, vomiting and diarrhea.  Skin: Negative for rash.  Neurological: Negative for dizziness, weakness, numbness and headaches.  Hematological: Bruises/bleeds easily.       On blood thinner  Psychiatric/Behavioral: Negative for confusion. The patient is not nervous/anxious.       Objective:   Physical Exam  Constitutional: She is oriented to person, place, and time. She appears well-developed and well-nourished. No distress.  BP 133/75 mmHg  Pulse 83  Temp(Src) 98.2 F (36.8 C) (Oral)  Resp 12  Ht 5' 8.5" (1.74 m)  Wt 251 lb 12.8 oz (114.216 kg)  BMI 37.72 kg/m2  SpO2 95%   HENT:  Head: Normocephalic and atraumatic.  Right Ear: External ear normal.  Left Ear: External ear normal.  Cardiovascular: Normal rate, regular rhythm and normal heart sounds.  Exam reveals no gallop and no friction rub.   No murmur heard. Pulmonary/Chest: Effort normal and breath sounds normal. No respiratory distress. She has no wheezes. She has no rales. She exhibits no tenderness.  Neurological: She is alert and oriented to person, place, and time.  No cranial nerve deficit. She exhibits normal muscle tone. Coordination normal.  Asked pt 5 questions (president, season, current location, identify objects, correct time on watch)  that she answered quickly and correctly.   Skin: Skin is warm and dry. She is not diaphoretic.  Ecchymosis of face and ear on right, right arm, and right mid back- hematoma evident- 1-2 inches, tender  Psychiatric: She has a normal mood and affect. Her behavior is normal. Judgment and thought content normal.      Assessment & Plan:  Ecchymosis  1) Gave handout with instructions  2) Will FU in 1 week due to pt being on Coumadin  3) Asked her to talk to her pharmacist again for name of vesicare alternative that is cheaper

## 2015-04-27 NOTE — Progress Notes (Signed)
Pre visit review using our clinic review tool, if applicable. No additional management support is needed unless otherwise documented below in the visit note. 

## 2015-04-27 NOTE — Patient Instructions (Signed)

## 2015-05-04 ENCOUNTER — Encounter: Payer: Self-pay | Admitting: Internal Medicine

## 2015-05-04 NOTE — Telephone Encounter (Signed)
Pt was notified that she would need to be evaluated before she could be treated. My need imaging. Pt was offered an appt with Morey Hummingbird tomorrow @ 1pm & recommended that she goes to [facility] walkin or [facility] if she needs to be seen today since they are able to do imaging there.

## 2015-05-09 ENCOUNTER — Ambulatory Visit: Payer: Medicare Other | Admitting: Nurse Practitioner

## 2015-05-10 LAB — HM DIABETES EYE EXAM

## 2015-05-11 ENCOUNTER — Encounter: Payer: Self-pay | Admitting: Internal Medicine

## 2015-05-11 ENCOUNTER — Other Ambulatory Visit: Payer: Medicare Other

## 2015-05-11 ENCOUNTER — Encounter: Payer: Self-pay | Admitting: *Deleted

## 2015-05-11 ENCOUNTER — Telehealth: Payer: Self-pay | Admitting: Internal Medicine

## 2015-05-11 NOTE — Telephone Encounter (Signed)
Patient needing to be worked in for back pain.

## 2015-05-11 NOTE — Telephone Encounter (Signed)
LMTCB & sent mychart message 

## 2015-05-11 NOTE — Telephone Encounter (Signed)
Pt has been placed on Dr. Nicki Reaper schedule for 5/25 @ 2pm. (waiting for patient to confirm this appt)

## 2015-05-11 NOTE — Telephone Encounter (Signed)
I am going to be gone end of this week and beginning of next week.  I can see her on 05/17/15 - 2:00 (30 min).  If needs evaluation prior - can see her rheumatologist or acute care.

## 2015-05-11 NOTE — Telephone Encounter (Signed)
Per Lenna Sciara, pt states that Wed is too far away so she will go to acute care instead. (appt cancelled)

## 2015-05-12 ENCOUNTER — Encounter: Payer: Self-pay | Admitting: *Deleted

## 2015-05-17 ENCOUNTER — Ambulatory Visit: Payer: Medicare Other | Admitting: Internal Medicine

## 2015-05-19 ENCOUNTER — Encounter: Payer: Self-pay | Admitting: Internal Medicine

## 2015-05-21 ENCOUNTER — Other Ambulatory Visit: Payer: Self-pay | Admitting: Internal Medicine

## 2015-06-04 ENCOUNTER — Other Ambulatory Visit: Payer: Self-pay | Admitting: Internal Medicine

## 2015-07-26 DIAGNOSIS — F5101 Primary insomnia: Secondary | ICD-10-CM | POA: Insufficient documentation

## 2015-07-26 DIAGNOSIS — I10 Essential (primary) hypertension: Secondary | ICD-10-CM | POA: Insufficient documentation

## 2015-07-26 DIAGNOSIS — M545 Low back pain, unspecified: Secondary | ICD-10-CM | POA: Insufficient documentation

## 2015-07-26 DIAGNOSIS — G8929 Other chronic pain: Secondary | ICD-10-CM | POA: Insufficient documentation

## 2015-07-26 DIAGNOSIS — G2581 Restless legs syndrome: Secondary | ICD-10-CM | POA: Insufficient documentation

## 2015-07-28 ENCOUNTER — Other Ambulatory Visit: Payer: Self-pay | Admitting: Internal Medicine

## 2015-07-28 NOTE — Telephone Encounter (Signed)
Refilled neurontin #180 with no refills.  Needs to keep her scheduled appt.

## 2015-07-28 NOTE — Telephone Encounter (Signed)
Last OV 1.29.16 with several no shows.  Last refill 4.25.16.  Please advise refill

## 2015-08-07 ENCOUNTER — Other Ambulatory Visit: Payer: Self-pay | Admitting: Internal Medicine

## 2015-08-08 NOTE — Telephone Encounter (Signed)
ok'd refill lasix #30 with no refills.

## 2015-08-08 NOTE — Telephone Encounter (Signed)
Last OV 1.29.16.  Please advise refill

## 2015-08-22 ENCOUNTER — Telehealth: Payer: Self-pay | Admitting: Internal Medicine

## 2015-08-22 NOTE — Telephone Encounter (Signed)
Pt states she will not be coming back to the practice. Just wanted to let you know. Per pt she wants appt to be cancelled.

## 2015-08-25 ENCOUNTER — Other Ambulatory Visit: Payer: Self-pay | Admitting: Internal Medicine

## 2015-08-25 ENCOUNTER — Ambulatory Visit: Payer: Medicare Other | Admitting: Internal Medicine

## 2015-08-30 ENCOUNTER — Other Ambulatory Visit: Payer: Self-pay | Admitting: Internal Medicine

## 2015-08-30 NOTE — Telephone Encounter (Signed)
Last OV 5.5.16 with multiple cancellations please advise refill

## 2015-08-30 NOTE — Telephone Encounter (Signed)
Pt had left previous message that she is not coming back to the office.  Apparently seeing another physician.  Need to forward request to new MD.  Let me know if any problems.

## 2015-08-31 ENCOUNTER — Encounter: Payer: Self-pay | Admitting: Internal Medicine

## 2015-09-18 ENCOUNTER — Other Ambulatory Visit: Payer: Self-pay | Admitting: Internal Medicine

## 2015-09-20 ENCOUNTER — Other Ambulatory Visit: Payer: Self-pay | Admitting: Otolaryngology

## 2015-09-20 DIAGNOSIS — E041 Nontoxic single thyroid nodule: Secondary | ICD-10-CM

## 2015-10-06 ENCOUNTER — Other Ambulatory Visit: Payer: Self-pay | Admitting: Internal Medicine

## 2015-10-06 NOTE — Telephone Encounter (Signed)
Pt has cancelled appt on 5.17.16, 5.19.16, 5.25.16, 9.2.16, no OV scheduled. Please advise refill

## 2015-10-06 NOTE — Telephone Encounter (Signed)
She is no longer being seen here.  Notify pharmacy to contact her new PCP.  Thanks

## 2015-10-06 NOTE — Telephone Encounter (Signed)
Informed pharmacy that pt is no longer under Dr Bary Leriche care and to contact new PCP

## 2015-10-18 ENCOUNTER — Other Ambulatory Visit: Payer: Self-pay | Admitting: Specialist

## 2015-10-18 DIAGNOSIS — IMO0001 Reserved for inherently not codable concepts without codable children: Secondary | ICD-10-CM

## 2015-10-18 DIAGNOSIS — R0602 Shortness of breath: Secondary | ICD-10-CM

## 2015-10-25 ENCOUNTER — Ambulatory Visit: Payer: Medicare Other

## 2015-10-27 ENCOUNTER — Ambulatory Visit
Admission: RE | Admit: 2015-10-27 | Discharge: 2015-10-27 | Disposition: A | Discharge status: Home / Self Care | Payer: Medicare Other | Source: Ambulatory Visit | Attending: Specialist | Admitting: Specialist

## 2015-10-27 DIAGNOSIS — I2510 Atherosclerotic heart disease of native coronary artery without angina pectoris: Secondary | ICD-10-CM | POA: Diagnosis not present

## 2015-10-27 DIAGNOSIS — J8410 Pulmonary fibrosis, unspecified: Secondary | ICD-10-CM | POA: Insufficient documentation

## 2015-10-27 DIAGNOSIS — IMO0001 Reserved for inherently not codable concepts without codable children: Secondary | ICD-10-CM

## 2015-10-27 DIAGNOSIS — R0602 Shortness of breath: Secondary | ICD-10-CM | POA: Insufficient documentation

## 2015-11-26 ENCOUNTER — Other Ambulatory Visit: Payer: Self-pay | Admitting: Internal Medicine

## 2015-12-01 ENCOUNTER — Other Ambulatory Visit: Payer: Self-pay | Admitting: Internal Medicine

## 2015-12-29 DIAGNOSIS — J849 Interstitial pulmonary disease, unspecified: Secondary | ICD-10-CM | POA: Insufficient documentation

## 2016-01-01 ENCOUNTER — Other Ambulatory Visit: Payer: Self-pay | Admitting: Internal Medicine

## 2016-01-01 NOTE — Telephone Encounter (Signed)
Please advise refill.  Thanks 

## 2016-01-17 ENCOUNTER — Encounter: Payer: Self-pay | Admitting: Internal Medicine

## 2016-01-17 ENCOUNTER — Ambulatory Visit (INDEPENDENT_AMBULATORY_CARE_PROVIDER_SITE_OTHER): Payer: Medicare Other | Admitting: Internal Medicine

## 2016-01-17 VITALS — BP 140/76 | HR 82 | Ht 69.0 in | Wt 240.0 lb

## 2016-01-17 DIAGNOSIS — G4733 Obstructive sleep apnea (adult) (pediatric): Secondary | ICD-10-CM | POA: Diagnosis not present

## 2016-01-17 DIAGNOSIS — R0602 Shortness of breath: Secondary | ICD-10-CM

## 2016-01-17 MED ORDER — FLUTICASONE FUROATE-VILANTEROL 100-25 MCG/INH IN AEPB: 1.0000 | INHALATION_SPRAY | Freq: Every day | RESPIRATORY_TRACT | Status: AC

## 2016-01-17 MED ORDER — FLUTICASONE FUROATE-VILANTEROL 100-25 MCG/INH IN AEPB: 1.0000 | INHALATION_SPRAY | Freq: Every day | RESPIRATORY_TRACT | Status: DC

## 2016-01-17 MED ORDER — ALBUTEROL SULFATE HFA 108 (90 BASE) MCG/ACT IN AERS: 2.0000 | INHALATION_SPRAY | RESPIRATORY_TRACT | Status: DC | PRN

## 2016-01-17 NOTE — Patient Instructions (Signed)

## 2016-01-17 NOTE — Progress Notes (Signed)
[facility] Consultation      Date: 01/17/2016,   MRN# [id number] DEISHA MOSBY [birth year] Code Status:  Code Status History    This patient does not have a recorded code status. Please follow your organizational policy for patients in this situation.     Hosp day:@LENGTHOFSTAYDAYS @ Referring MD: @ATDPROV @     PCP:      AdmissionWeight: 240 lb (108.863 kg)                 CurrentWeight: 240 lb (108.863 kg) ZANE MACHIDA is a 70 y.o. 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 with Azathioprine-sees [facility] Patient was restarted on Prednisone last 2 months and she hates it-bothers her overall well being 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 Headache(784.0)   . Backache, unspecified   . Herpes zoster without mention of complication   .  Osteoarthrosis, unspecified whether generalized or localized, unspecified site   . Nontoxic uninodular goiter   . Obesity, unspecified   . Esophageal reflux   . Unspecified sleep apnea   . Atrial fibrillation ([place])   . Acute diastolic heart failure ([place])   . Cardiomegaly   . Hypertension     heart controlled w CHF  . Asthma   . Diabetes mellitus without complication ([place])   . Allergy   . Hx: UTI (urinary tract infection)   . Urine incontinence     hx of  . 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 Hysterectomy w unilateral    . Oophorectomy    . Cholecystectomy    . Abdominal hysterectomy  1979    complete (for precancerous cells)  . Ablation  2011 & 2014     FAMILY HISTORY   Family History  Problem Relation Age of Onset  . Heart attack Father   . Heart failure Father   . Arthritis Father   . Stroke Father   . Hypertension Father   . Coronary artery disease Brother   . Peripheral vascular disease Brother   . Arthritis Mother   . Cancer Mother     colon cancer  . Hypertension Mother   . Cancer Maternal Grandmother  colon cancer  . Arthritis Maternal Grandmother      SOCIAL HISTORY   Social History  Substance Use Topics  . Smoking status: Former Research scientist (life sciences)  . Smokeless tobacco: Never Used     Comment: Has a 20-pack-year history, qutting in 1970.   Marland Kitchen Alcohol Use: No     MEDICATIONS    Home Medication:  Current Outpatient Rx  Name  Route  Sig  Dispense  Refill  . azaTHIOprine (IMURAN) 50 MG tablet   Oral   Take 100 mg by mouth daily.         . Biotin 5000 MCG CAPS   Oral   Take 1 capsule by mouth daily.         . calcium-vitamin D (OSCAL WITH D) 500-200 MG-UNIT per tablet   Oral   Take 1 tablet by mouth daily.         Marland Kitchen diltiazem (CARDIZEM CD) 240 MG 24 hr capsule      TAKE ONE CAPSULE BY MOUTH DAILY   30 capsule   5   . furosemide (LASIX) 40 MG tablet       TAKE ONE TABLET BY MOUTH ONCE DAILY   30 tablet   0   . gabapentin (NEURONTIN) 600 MG tablet      TAKE 1 TABLET BY MOUTH TWICE A DAY   180 tablet   0   . losartan (COZAAR) 50 MG tablet      TAKE 1 TABLET BY MOUTH DAILY   30 tablet   5   . metFORMIN (GLUCOPHAGE) 1000 MG tablet      TAKE 1 TABLET BY MOUTH TWICE A DAY   180 tablet   4   . omeprazole (PRILOSEC) 40 MG capsule      TAKE ONE CAPSULE BY MOUTH EVERY NIGHT AT BEDTIME   90 capsule   3   . predniSONE (DELTASONE) 10 MG tablet   Oral   Take 10 mg by mouth daily with breakfast.         . rOPINIRole (REQUIP) 1 MG tablet   Oral   Take 2 mg by mouth at bedtime.         . traZODone (DESYREL) 100 MG tablet      TAKE 1 AND 1/2 TABLETS BY MOUTH EVERY NIGHT   45 tablet   2   . triamterene-hydrochlorothiazide (DYAZIDE) 37.5-25 MG per capsule   Oral   Take 1 each (1 capsule total) by mouth daily.   30 capsule   2   . venlafaxine XR (EFFEXOR-XR) 150 MG 24 hr capsule      TAKE 1 CAPSULE (150 MG TOTAL) BY MOUTH DAILY WITH BREAKFAST.   30 capsule   2   . warfarin (COUMADIN) 1 MG tablet      TAKE AS DIRECTED Patient taking differently: As of 12/30/14-Take 6mg  on Tuesday & Thursday, & 5mg  all other days   120 tablet   1   . warfarin (COUMADIN) 4 MG tablet      TAKE 1 TABLET BY MOUTH EVERY DAY   90 tablet   1   . Zinc 50 MG TABS   Oral   Take 1 tablet by mouth daily.         Marland Kitchen albuterol (PROAIR HFA) 108 (90 Base) MCG/ACT inhaler   Inhalation   Inhale 2 puffs into the lungs every 6 (six) hours as needed for wheezing or shortness of breath. Reported on 01/17/2016         .  albuterol (PROVENTIL HFA;VENTOLIN HFA) 108 (90 Base) MCG/ACT inhaler   Inhalation   Inhale 2 puffs into the lungs every 4 (four) hours as needed for wheezing or shortness of breath.   1 Inhaler   10   . Fluticasone Furoate-Vilanterol 100-25 MCG/INH AEPB   Inhalation   Inhale 1 puff into the lungs daily.   60 each   5   .  Fluticasone Furoate-Vilanterol 100-25 MCG/INH AEPB   Inhalation   Inhale 1 puff into the lungs daily.   14 each   0     Current Medication:  Current outpatient prescriptions:  .  azaTHIOprine (IMURAN) 50 MG tablet, Take 100 mg by mouth daily., Disp: , Rfl:  .  Biotin 5000 MCG CAPS, Take 1 capsule by mouth daily., Disp: , Rfl:  .  calcium-vitamin D (OSCAL WITH D) 500-200 MG-UNIT per tablet, Take 1 tablet by mouth daily., Disp: , Rfl:  .  diltiazem (CARDIZEM CD) 240 MG 24 hr capsule, TAKE ONE CAPSULE BY MOUTH DAILY, Disp: 30 capsule, Rfl: 5 .  furosemide (LASIX) 40 MG tablet, TAKE ONE TABLET BY MOUTH ONCE DAILY, Disp: 30 tablet, Rfl: 0 .  gabapentin (NEURONTIN) 600 MG tablet, TAKE 1 TABLET BY MOUTH TWICE A DAY, Disp: 180 tablet, Rfl: 0 .  losartan (COZAAR) 50 MG tablet, TAKE 1 TABLET BY MOUTH DAILY, Disp: 30 tablet, Rfl: 5 .  metFORMIN (GLUCOPHAGE) 1000 MG tablet, TAKE 1 TABLET BY MOUTH TWICE A DAY, Disp: 180 tablet, Rfl: 4 .  omeprazole (PRILOSEC) 40 MG capsule, TAKE ONE CAPSULE BY MOUTH EVERY NIGHT AT BEDTIME, Disp: 90 capsule, Rfl: 3 .  predniSONE (DELTASONE) 10 MG tablet, Take 10 mg by mouth daily with breakfast., Disp: , Rfl:  .  rOPINIRole (REQUIP) 1 MG tablet, Take 2 mg by mouth at bedtime., Disp: , Rfl:  .  traZODone (DESYREL) 100 MG tablet, TAKE 1 AND 1/2 TABLETS BY MOUTH EVERY NIGHT, Disp: 45 tablet, Rfl: 2 .  triamterene-hydrochlorothiazide (DYAZIDE) 37.5-25 MG per capsule, Take 1 each (1 capsule total) by mouth daily., Disp: 30 capsule, Rfl: 2 .  venlafaxine XR (EFFEXOR-XR) 150 MG 24 hr capsule, TAKE 1 CAPSULE (150 MG TOTAL) BY MOUTH DAILY WITH BREAKFAST., Disp: 30 capsule, Rfl: 2 .  warfarin (COUMADIN) 1 MG tablet, TAKE AS DIRECTED (Patient taking differently: As of 12/30/14-Take 6mg  on Tuesday & Thursday, & 5mg  all other days), Disp: 120 tablet, Rfl: 1 .  warfarin (COUMADIN) 4 MG tablet, TAKE 1 TABLET BY MOUTH EVERY DAY, Disp: 90 tablet, Rfl: 1 .  Zinc 50 MG TABS, Take 1 tablet by  mouth daily., Disp: , Rfl:  .  albuterol (PROAIR HFA) 108 (90 Base) MCG/ACT inhaler, Inhale 2 puffs into the lungs every 6 (six) hours as needed for wheezing or shortness of breath. Reported on 01/17/2016, Disp: , Rfl:  .  albuterol (PROVENTIL HFA;VENTOLIN HFA) 108 (90 Base) MCG/ACT inhaler, Inhale 2 puffs into the lungs every 4 (four) hours as needed for wheezing or shortness of breath., Disp: 1 Inhaler, Rfl: 10 .  Fluticasone Furoate-Vilanterol 100-25 MCG/INH AEPB, Inhale 1 puff into the lungs daily., Disp: 60 each, Rfl: 5 .  Fluticasone Furoate-Vilanterol 100-25 MCG/INH AEPB, Inhale 1 puff into the lungs daily., Disp: 14 each, Rfl: 0    ALLERGIES   Amiodarone     REVIEW OF SYSTEMS   Review of Systems  Constitutional: Negative for fever, chills, weight loss, malaise/fatigue and diaphoresis.  HENT: Negative for congestion and hearing loss.   Eyes: Negative  for blurred vision and double vision.  Respiratory: Positive for cough, shortness of breath and wheezing. Negative for hemoptysis and sputum production.   Cardiovascular: Negative for chest pain, palpitations, orthopnea and leg swelling.  Gastrointestinal: Negative for heartburn, nausea, vomiting, abdominal pain and diarrhea.  Genitourinary: Negative for dysuria.  Musculoskeletal: Positive for back pain and joint pain. Negative for myalgias and neck pain.  Skin: Negative for rash.  Neurological: Negative for dizziness, tingling, tremors, weakness and headaches.  Endo/Heme/Allergies: Does not bruise/bleed easily.  Psychiatric/Behavioral: Negative for depression, suicidal ideas and substance abuse. The patient is not nervous/anxious.   All other systems reviewed and are negative.    VS: BP 140/76 mmHg  Pulse 82  Ht 5\' 9"  (1.753 m)  Wt 240 lb (108.863 kg)  BMI 35.43 kg/m2  SpO2 96%     PHYSICAL EXAM  Physical Exam  Constitutional: She is oriented to person, place, and time. She appears well-developed and well-nourished. No  distress.  HENT:  Head: Normocephalic and atraumatic.  Mouth/Throat: No oropharyngeal exudate.  Large beefy tongue  Eyes: EOM are normal. Pupils are equal, round, and reactive to light. No scleral icterus.  Neck: Normal range of motion. Neck supple.  Cardiovascular: Normal rate, regular rhythm and normal heart sounds.   No murmur heard. Pulmonary/Chest: No stridor. No respiratory distress. She has wheezes. She has rales.  Abdominal: Soft. Bowel sounds are normal.  Musculoskeletal: Normal range of motion. She exhibits no edema.  Neurological: She is alert and oriented to person, place, and time. She displays normal reflexes. Coordination normal.  Skin: Skin is warm. She is not diaphoretic.  Psychiatric: She has a normal mood and affect.             ASSESSMENT/PLAN   70 yo white female seen today for SOB, wheezing and cough with evidence on PFT to suggest mild COPD based on flow volume loops and reduction of FEF25/75 with underlying Interstitial lung disease that may be beginning stages of NSIP/UIP  with b/l Bronchiectasis with underlying ANCA vasculitis and untreated noncompliant OSA, patient may have underlying Pulm HTN that could be related to multifactorial causes  1.will need to check 6 MWT to assess oxygen status 2.will start BREO and continue albuterol as needed 3.check ECHO to assess LV fxn and PAP 4.will need to re-assess OSA with split night study and also to assess oxygen needs at night 5.patient would like to wean off prednisone-suggested to take 5 mg daily for 7 days then stop 6.repeat CT chest 6 months for interval changes 7.after therapy for OSA  And  therapy for MILD COPD, will re-assess the need for work up for pulm HTN   I have personally obtained a history, examined the patient, evaluated laboratory and independently reviewed imaging results, formulated the assessment and plan and placed orders.  The Patient requires high complexity decision making for assessment  and support, frequent evaluation and titration of therapies, application of advanced monitoring technologies and extensive interpretation of multiple databases.    Patient/Family are satisfied with Plan of action and management. All questions answered  Corrin Parker, M.D.  [facility]  Medical Director [place] Director [facility] Cardio-Pulmonary Department

## 2016-01-19 ENCOUNTER — Other Ambulatory Visit: Payer: Self-pay

## 2016-01-19 ENCOUNTER — Ambulatory Visit (INDEPENDENT_AMBULATORY_CARE_PROVIDER_SITE_OTHER): Payer: Medicare Other

## 2016-01-19 DIAGNOSIS — R0602 Shortness of breath: Secondary | ICD-10-CM

## 2016-01-22 ENCOUNTER — Encounter: Payer: Self-pay | Admitting: Internal Medicine

## 2016-01-26 ENCOUNTER — Ambulatory Visit: Payer: Medicare Other | Admitting: Internal Medicine

## 2016-01-29 ENCOUNTER — Telehealth: Payer: Self-pay | Admitting: *Deleted

## 2016-01-29 DIAGNOSIS — I4891 Unspecified atrial fibrillation: Secondary | ICD-10-CM

## 2016-01-29 NOTE — Telephone Encounter (Signed)
Pt informed of O2 results. Pt wears CPAP. Will place order for O2 and to bleed O2 into CPAP.

## 2016-02-16 ENCOUNTER — Ambulatory Visit: Payer: Medicare Other | Attending: Pulmonary Disease

## 2016-02-16 DIAGNOSIS — G4733 Obstructive sleep apnea (adult) (pediatric): Secondary | ICD-10-CM | POA: Insufficient documentation

## 2016-02-23 ENCOUNTER — Ambulatory Visit: Payer: Medicare Other

## 2016-02-23 DIAGNOSIS — G4733 Obstructive sleep apnea (adult) (pediatric): Secondary | ICD-10-CM | POA: Diagnosis not present

## 2016-02-26 ENCOUNTER — Ambulatory Visit: Payer: Medicare Other

## 2016-02-27 ENCOUNTER — Telehealth: Payer: Self-pay | Admitting: *Deleted

## 2016-02-27 DIAGNOSIS — G4733 Obstructive sleep apnea (adult) (pediatric): Secondary | ICD-10-CM

## 2016-02-27 NOTE — Telephone Encounter (Signed)
Pt informed of Sleep Study Results. Will order settings for CPAP.

## 2016-03-06 ENCOUNTER — Encounter: Payer: Self-pay | Admitting: Internal Medicine

## 2016-03-06 ENCOUNTER — Other Ambulatory Visit: Payer: Self-pay | Admitting: Internal Medicine

## 2016-03-06 ENCOUNTER — Ambulatory Visit (INDEPENDENT_AMBULATORY_CARE_PROVIDER_SITE_OTHER): Payer: Medicare Other | Admitting: Internal Medicine

## 2016-03-06 VITALS — BP 152/82 | HR 100 | Ht 69.0 in | Wt 242.2 lb

## 2016-03-06 DIAGNOSIS — I5031 Acute diastolic (congestive) heart failure: Secondary | ICD-10-CM

## 2016-03-06 MED ORDER — FLUTICASONE FUROATE-VILANTEROL 100-25 MCG/INH IN AEPB: 1.0000 | INHALATION_SPRAY | Freq: Every day | RESPIRATORY_TRACT | Status: AC

## 2016-03-06 NOTE — Patient Instructions (Signed)
Sleep Apnea  Sleep apnea is a sleep disorder characterized by abnormal pauses in breathing while you sleep. When your breathing pauses, the level of oxygen in your blood decreases. This causes you to move out of deep sleep and into light sleep. As a result, your quality of sleep is poor, and the system that carries your blood throughout your body (cardiovascular system) experiences stress. If sleep apnea remains untreated, the following conditions can develop:  High blood pressure (hypertension).  Coronary artery disease.  Inability to achieve or maintain an erection (impotence).  Impairment of your thought process (cognitive dysfunction). There are three types of sleep apnea: 1. Obstructive sleep apnea--Pauses in breathing during sleep because of a blocked airway. 2. Central sleep apnea--Pauses in breathing during sleep because the area of the brain that controls your breathing does not send the correct signals to the muscles that control breathing. 3. Mixed sleep apnea--A combination of both obstructive and central sleep apnea. RISK FACTORS The following risk factors can increase your risk of developing sleep apnea:  Being overweight.  Smoking.  Having narrow passages in your nose and throat.  Being of older age.  Being female.  Alcohol use.  Sedative and tranquilizer use.  Ethnicity. Among individuals younger than 35 years, African Americans are at increased risk of sleep apnea. SYMPTOMS   Difficulty staying asleep.  Daytime sleepiness and fatigue.  Loss of energy.  Irritability.  Loud, heavy snoring.  Morning headaches.  Trouble concentrating.  Forgetfulness.  Decreased interest in sex.  Unexplained sleepiness. DIAGNOSIS  In order to diagnose sleep apnea, your caregiver will perform a physical examination. A sleep study done in the comfort of your own home may be appropriate if you are otherwise healthy. Your caregiver may also recommend that you spend the  night in a sleep lab. In the sleep lab, several monitors record information about your heart, lungs, and brain while you sleep. Your leg and arm movements and blood oxygen level are also recorded. TREATMENT The following actions may help to resolve mild sleep apnea:  Sleeping on your side.   Using a decongestant if you have nasal congestion.   Avoiding the use of depressants, including alcohol, sedatives, and narcotics.   Losing weight and modifying your diet if you are overweight. There also are devices and treatments to help open your airway:  Oral appliances. These are custom-made mouthpieces that shift your lower jaw forward and slightly open your bite. This opens your airway.  Devices that create positive airway pressure. This positive pressure "splints" your airway open to help you breathe better during sleep. The following devices create positive airway pressure:  Continuous positive airway pressure (CPAP) device. The CPAP device creates a continuous level of air pressure with an air pump. The air is delivered to your airway through a mask while you sleep. This continuous pressure keeps your airway open.  Nasal expiratory positive airway pressure (EPAP) device. The EPAP device creates positive air pressure as you exhale. The device consists of single-use valves, which are inserted into each nostril and held in place by adhesive. The valves create very little resistance when you inhale but create much more resistance when you exhale. That increased resistance creates the positive airway pressure. This positive pressure while you exhale keeps your airway open, making it easier to breath when you inhale again.  Bilevel positive airway pressure (BPAP) device. The BPAP device is used mainly in patients with central sleep apnea. This device is similar to the CPAP device because   it also uses an air pump to deliver continuous air pressure through a mask. However, with the BPAP machine, the  pressure is set at two different levels. The pressure when you exhale is lower than the pressure when you inhale.  Surgery. Typically, surgery is only done if you cannot comply with less invasive treatments or if the less invasive treatments do not improve your condition. Surgery involves removing excess tissue in your airway to create a wider passage way.   This information is not intended to replace advice given to you by your health care provider. Make sure you discuss any questions you have with your health care provider.   Document Released: 11/29/2002 Document Revised: 12/30/2014 Document Reviewed: 04/16/2012 Elsevier Interactive Patient Education 2016 [employer]   

## 2016-03-06 NOTE — Progress Notes (Signed)
[facility] Consultation      Date: 03/06/2016,   MRN# [id number] Betty [name] 25-Feb-1946 Code Status:  Code Status History    This patient does not have a recorded code status. Please follow your organizational policy for patients in this situation.     Hosp day:@LENGTHOFSTAYDAYS @ Referring MD: @ATDPROV @     PCP:      AdmissionWeight: 242 lb 3.2 oz (109.861 kg)                 CurrentWeight: 242 lb 3.2 oz (109.861 kg) Betty [name] is a 70 y.o. 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  albuterol (PROVENTIL HFA;VENTOLIN HFA) 108 (90 Base) MCG/ACT inhaler, Inhale 2 puffs into the lungs every 4 (four) hours as needed for wheezing or shortness of breath., Disp: 1 Inhaler, Rfl: 10 .  azaTHIOprine (IMURAN) 50 MG tablet, Take 100 mg by mouth daily., Disp: , Rfl:  .  Biotin 5000 MCG CAPS, Take 1 capsule by mouth daily., Disp: , Rfl:  .  calcium-vitamin D (OSCAL WITH D) 500-200 MG-UNIT per tablet, Take 1 tablet by mouth daily., Disp: , Rfl:  .  diltiazem (CARDIZEM CD) 240 MG 24 hr capsule, TAKE ONE CAPSULE BY MOUTH DAILY, Disp: 30 capsule, Rfl: 5 .  furosemide (LASIX) 40 MG  tablet, TAKE ONE TABLET BY MOUTH ONCE DAILY, Disp: 30 tablet, Rfl: 0 .  gabapentin (NEURONTIN) 600 MG tablet, TAKE 1 TABLET BY MOUTH TWICE A DAY, Disp: 180 tablet, Rfl: 0 .  losartan (COZAAR) 50 MG tablet, TAKE 1 TABLET BY MOUTH DAILY, Disp: 30 tablet, Rfl: 5 .  metFORMIN (GLUCOPHAGE) 1000 MG tablet, TAKE 1 TABLET BY MOUTH TWICE A DAY, Disp: 180 tablet, Rfl: 4 .  omeprazole (PRILOSEC) 40 MG capsule, TAKE ONE CAPSULE BY MOUTH EVERY NIGHT AT BEDTIME, Disp: 90 capsule, Rfl: 3 .  rOPINIRole (REQUIP) 1 MG tablet, Take 2 mg by mouth at bedtime., Disp: , Rfl:  .  traZODone (DESYREL) 100 MG tablet, TAKE 1 AND 1/2 TABLETS BY MOUTH EVERY NIGHT, Disp: 45 tablet, Rfl: 2 .  triamterene-hydrochlorothiazide (DYAZIDE) 37.5-25 MG per capsule, Take 1 each (1 capsule total) by mouth daily., Disp: 30 capsule, Rfl: 2 .  venlafaxine XR (EFFEXOR-XR) 150 MG 24 hr capsule, TAKE 1 CAPSULE (150 MG TOTAL) BY MOUTH DAILY WITH BREAKFAST., Disp: 30 capsule, Rfl: 2 .  warfarin (COUMADIN) 1 MG tablet, TAKE AS DIRECTED (Patient taking differently: As of 12/30/14-Take 6mg  on Tuesday & Thursday, & 5mg  all other days), Disp: 120 tablet, Rfl: 1 .  warfarin (COUMADIN) 4 MG tablet, TAKE 1 TABLET BY MOUTH EVERY DAY, Disp: 90  tablet, Rfl: 1 .  Zinc 50 MG TABS, Take 1 tablet by mouth daily., Disp: , Rfl:  .  Fluticasone Furoate-Vilanterol 100-25 MCG/INH AEPB, Inhale 1 puff into the lungs daily. (Patient not taking: Reported on 03/06/2016), Disp: 60 each, Rfl: 5    ALLERGIES   Amiodarone     REVIEW OF SYSTEMS   Review of Systems  Constitutional: Negative for fever, chills, weight loss and malaise/fatigue.  HENT: Negative for congestion.   Respiratory: Positive for shortness of breath and wheezing. Negative for cough, hemoptysis and sputum production.   Cardiovascular: Positive for leg swelling.  Gastrointestinal: Negative for heartburn, nausea and vomiting.  Musculoskeletal: Positive for back pain and joint pain.  Neurological:  Negative for dizziness.  Endo/Heme/Allergies: Does not bruise/bleed easily.  All other systems reviewed and are negative.    VS: BP 152/82 mmHg  Pulse 100  Ht 5\' 9"  (1.753 m)  Wt 242 lb 3.2 oz (109.861 kg)  BMI 35.75 kg/m2  SpO2 93%     PHYSICAL EXAM  Physical Exam  Constitutional: She is oriented to person, place, and time. She appears well-developed and well-nourished. No distress.  HENT:  Head: Normocephalic and atraumatic.  Large beefy tongue  Neck: Neck supple.  Cardiovascular: Normal rate, regular rhythm and normal heart sounds.   No murmur heard. Pulmonary/Chest: Effort normal. No respiratory distress. She has wheezes. She has no rales.  Musculoskeletal: Normal range of motion. She exhibits edema.  Neurological: She is alert and oriented to person, place, and time.  Skin: Skin is warm. She is not diaphoretic.  Psychiatric: She has a normal mood and affect.             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  2.will continue BREO and continue albuterol as needed 3.Cardiology Referral for Grade 2 diastolic dysfunction patient requests second opinion 4.severe OSA -continue CPAP approx 10-20 cm h20  5.repeat CT chest 6 months for interval changes  Follow up in 2 months  The Patient requires high complexity decision making for assessment and support, frequent evaluation and titration of therapies, application of advanced monitoring technologies and extensive interpretation of multiple databases.  Patient satisfied with Plan of  action and management. All questions answered  Corrin Parker, M.D.  [facility]  Medical Director [place] Director [facility] Cardio-Pulmonary Department

## 2016-03-13 ENCOUNTER — Encounter: Payer: Self-pay | Admitting: Cardiology

## 2016-03-13 ENCOUNTER — Ambulatory Visit (INDEPENDENT_AMBULATORY_CARE_PROVIDER_SITE_OTHER): Payer: Medicare Other | Admitting: Cardiology

## 2016-03-13 VITALS — BP 130/68 | HR 71 | Ht 69.0 in | Wt 238.0 lb

## 2016-03-13 DIAGNOSIS — I5030 Unspecified diastolic (congestive) heart failure: Secondary | ICD-10-CM

## 2016-03-13 DIAGNOSIS — I482 Chronic atrial fibrillation, unspecified: Secondary | ICD-10-CM

## 2016-03-13 DIAGNOSIS — I10 Essential (primary) hypertension: Secondary | ICD-10-CM | POA: Diagnosis not present

## 2016-03-13 DIAGNOSIS — I4581 Long QT syndrome: Secondary | ICD-10-CM

## 2016-03-13 DIAGNOSIS — I050 Rheumatic mitral stenosis: Secondary | ICD-10-CM | POA: Insufficient documentation

## 2016-03-13 DIAGNOSIS — R9431 Abnormal electrocardiogram [ECG] [EKG]: Secondary | ICD-10-CM | POA: Insufficient documentation

## 2016-03-13 MED ORDER — FUROSEMIDE 40 MG PO TABS: 40.0000 mg | ORAL_TABLET | Freq: Every day | ORAL | Status: DC

## 2016-03-13 NOTE — Progress Notes (Signed)
Cardiology Office Note   Date:  03/13/2016   ID:  Betty [name], DOB 23-Jul-1946, MRN [id number]  Referring Doctor:  Dion Body, MD   Cardiologist:   Wende Bushy, MD   Reason for consultation:  Chief Complaint  Patient presents with  . other    Shortness of breath. Meds reviewed verbally with pt.      History of Present Illness: Betty [name] is a 70 y.o. female who presents for Evaluation of shortness of breath. Patient reports shortness of breath going on for several months now, likely more than 8 months. Describes the shortness of breath with minimal exertion, progressive over time. Occasional chest tightness. Severity is moderate to severe in intensity, lasting minutes at a time, goes away with rest. Nonradiating. This is associated occasionally with sharp chest pain that is brief in duration.  She had seen Dr. Clayborn Bigness for cardiology. An echocardiogram and stress this were done previously. She is requesting a second opinion.  She remembers being started on Lasix several months ago, likely for the shortness of breath. She has noted some improvement but not a significant improvement.  She denies headache, fever, cough, colds, abdominal pain, PND, orthopnea. She does complain of leg swelling. She reports weight gain of around 20 pounds in the last 6 months, admits to excessive caloric intake.   ROS:  Please see the history of present illness. Aside from mentioned under HPI, all other systems are reviewed and negative.     Past Medical History  Diagnosis Date  . Headache(784.0)   . Backache, unspecified   . Herpes zoster without mention of complication   . Osteoarthrosis, unspecified whether generalized or localized, unspecified site   . Nontoxic uninodular goiter   . Obesity, unspecified   . Esophageal reflux   . Unspecified sleep apnea   . Atrial fibrillation ([place])   . Acute diastolic heart failure ([place])   . Cardiomegaly   . Hypertension     heart  controlled w CHF  . Asthma   . Diabetes mellitus without complication ([place])   . Allergy   . Hx: UTI (urinary tract infection)   . Urine incontinence     hx of  . 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 Hysterectomy w unilateral    . Oophorectomy    . Cholecystectomy    . Abdominal hysterectomy  1979    complete (for precancerous cells)  . Ablation  2011 & 2014     reports that she has quit smoking. She has never used smokeless tobacco. She reports that she does not drink alcohol or use illicit drugs.   family history includes Arthritis in her father, maternal grandmother, and mother; Cancer in her maternal grandmother and mother; Coronary artery disease in her brother; Heart attack in her father; Heart failure in her father; Hypertension in her father and mother; Peripheral vascular disease in her brother; Stroke in her father.   Current Outpatient Prescriptions  Medication Sig Dispense Refill  . albuterol (PROVENTIL HFA;VENTOLIN HFA) 108 (90 Base) MCG/ACT inhaler Inhale 2 puffs into the lungs every 4 (four) hours as needed for wheezing or shortness of breath. 1 Inhaler 10  . azaTHIOprine (IMURAN) 50 MG tablet Take 100 mg by mouth daily.    . Biotin 5000 MCG CAPS Take 1 capsule by mouth daily.    . calcium-vitamin D (  OSCAL WITH D) 500-200 MG-UNIT per tablet Take 1 tablet by mouth daily.    Marland Kitchen diltiazem (CARDIZEM CD) 240 MG 24 hr capsule TAKE ONE CAPSULE BY MOUTH DAILY 30 capsule 5  . Fluticasone Furoate-Vilanterol 100-25 MCG/INH AEPB Inhale 1 puff into the lungs daily. 60 each 5  . gabapentin (NEURONTIN) 600 MG tablet TAKE 1 TABLET BY MOUTH TWICE A DAY 180 tablet 0  . losartan (COZAAR) 50 MG tablet TAKE 1 TABLET BY MOUTH DAILY 30 tablet 5  . metFORMIN (GLUCOPHAGE) 1000 MG tablet TAKE 1 TABLET BY MOUTH TWICE A DAY 180 tablet 4  . omeprazole (PRILOSEC) 40 MG capsule  TAKE ONE CAPSULE BY MOUTH EVERY NIGHT AT BEDTIME 90 capsule 3  . rOPINIRole (REQUIP) 1 MG tablet Take 2 mg by mouth at bedtime.    . triamterene-hydrochlorothiazide (DYAZIDE) 37.5-25 MG per capsule Take 1 each (1 capsule total) by mouth daily. 30 capsule 2  . warfarin (COUMADIN) 1 MG tablet TAKE AS DIRECTED (Patient taking differently: As of 12/30/14-Take 6mg  on Tuesday & Thursday, & 5mg  all other days) 120 tablet 1  . warfarin (COUMADIN) 4 MG tablet TAKE 1 TABLET BY MOUTH EVERY DAY 90 tablet 1  . warfarin (COUMADIN) 5 MG tablet Take 5 mg by mouth as directed.    . Zinc 50 MG TABS Take 1 tablet by mouth daily.    . furosemide (LASIX) 40 MG tablet Take 1 tablet (40 mg total) by mouth daily. 60 tablet 6   No current facility-administered medications for this visit.    Allergies: Amiodarone    PHYSICAL EXAM: VS:  BP 130/68 mmHg  Pulse 71  Ht 5\' 9"  (1.753 m)  Wt 238 lb (107.956 kg)  BMI 35.13 kg/m2 , Body mass index is 35.13 kg/(m^2). Wt Readings from Last 3 Encounters:  03/13/16 238 lb (107.956 kg)  03/06/16 242 lb 3.2 oz (109.861 kg)  01/17/16 240 lb (108.863 kg)    GENERAL:  well developed, well nourished,  obese, not in acute distress HEENT: normocephalic, pink conjunctivae, anicteric sclerae, no xanthelasma, normal dentition, oropharynx clear NECK:  no neck vein engorgement, Positive hepatojugular reflux, carotid upstroke brisk and symmetric, no bruit, no thyromegaly, no lymphadenopathy LUNGS:  good respiratory effort, clear to auscultation bilaterally CV:  PMI not displaced, no thrills, no lifts, S1 and S2 within normal limits, no palpable S3 or S4, no murmurs, no rubs, no gallops ABD:  Soft, nontender, nondistended, normoactive bowel sounds, no abdominal aortic bruit, no hepatomegaly, no splenomegaly MS: nontender back, no kyphosis, no scoliosis, no joint deformities EXT:  2+ DP/PT pulses, +2 edema, no varicosities, no cyanosis, no clubbing SKIN: warm, nondiaphoretic, normal  turgor, no ulcers NEUROPSYCH: alert, oriented to person, place, and time, sensory/motor grossly intact, normal mood, appropriate affect  Recent Labs: 03/15/2015: ALT 40*; Hemoglobin 10.6*; Platelets 392.0   Lipid Panel    Component Value Date/Time   CHOL 153 03/07/2015 0833   TRIG 130.0 03/07/2015 0833   HDL 36.60* 03/07/2015 0833   CHOLHDL 4 03/07/2015 0833   VLDL 26.0 03/07/2015 0833   LDLCALC 90 03/07/2015 0833     Other studies Reviewed:  EKG:  EKG is ordered today, 03/13/2016. The ekg ordered was personally reviewed by me and it reveals sinus rhythm, 71 BPM, nonspecific ST-T wave changes. QT 480 ms, QTC 522 ms, prolonged.  Additional studies/ records that were reviewed personally reviewed by me today include:  Echocardiogram 01/19/2016: Left ventricle: The cavity size was normal. There was moderate  concentric hypertrophy.  Systolic function was normal. The  estimated ejection fraction was in the range of 60% to 65%. Wall  motion was normal; there were no regional wall motion  abnormalities. Features are consistent with a pseudonormal left  ventricular filling pattern, with concomitant abnormal relaxation  and increased filling pressure (grade 2 diastolic dysfunction). - Mitral valve: The findings are consistent with mild to moderate  stenosis. Mean gradient (D): 6 mm Hg. Valve area by pressure  half-time: 1.75 cm^2. Valve area by continuity equation (using  LVOT flow): 1.28 cm^2. - Left atrium: The atrium was moderately dilated.   Pharmacologic nuclear stress test 10/06/2015: Normal myocardial perfusion scan no evidence of  stress-induced myocardial ischemia ejection fraction of 69% conclusion  negative scan  Extensive review of records from care everywhere, from rheumatology, pulmonary, cardiology were done with the patient.  ASSESSMENT AND PLAN:  Shortness of breath, likely multifactorial: Pulmonary process-interstitial lung disease, mild COPD,  beginning stages of an NSIP/UIP with bilateral bronchiectasis Likely diastolic congestive heart failure, chronic, likely related to hypertension Obesity Deconditioning  Congestive heart failure, diastolic dysfunction, chronic No evidence of ischemia on nuclear stress test. Patient was started on Lasix. Evidence of some fluid retention on physical exam. Recommend to increase her Lasix to 40 mg twice a day. Repeat BMP in a week. Potassium was 4 from February 2017. Continue other medications for blood pressure. CHF teaching done. Daily weights. Sodium restriction. If shortness of breath does not improve with medical therapy, discussed the possibility of needing to proceed with left/right and left heart catheterization. Patient verbalized understanding.   Atrial fibrillation, status post ablation Sinus rhythm now. Oral anticoagulation with Coumadin care of PCP  Hypertension Likely contribution to diastolic congestive heart failure BP log Continue blood pressure meds including Cardizem, losartan, triamterene HCTZ  Mitral valve stenosis Mild to moderate Previously noted from echocardiogram 2016 Continue to monitor, serial evaluation  Prolonged QT interval on EKG QT of 480 ms, QTC 522 ms Medication list reviewed. It appears that patient is on trazodone and venlafaxine which are implicated in prolonging QT. Discussed in detail with the patient the need to discontinue these medications or switch to other medications after discussion with prescribing M.D. Patient was advised to contact prescribing M.D. as soon as possible.  Obesity Body mass index is 35.13 kg/(m^2).Marland Kitchen Recommend aggressive weight loss through diet and increased physical activity as tolerated. Lengthy discussion about modifying diet to reduce caloric intake, sodium restriction   Current medicines are reviewed at length with the patient today.  The patient does not have concerns regarding medicines.  Labs/ tests ordered today  include:  Orders Placed This Encounter  Procedures  . Basic Metabolic Panel (BMET)  . EKG 12-Lead    I had a lengthy and detailed discussion with the patient regarding diagnoses, prognosis, diagnostic options, treatment options, and side effects of medications.   I counseled the patient on importance of lifestyle modification including heart healthy diet, regular physical activity.  Disposition:   FU with undersigned in one month  This office visit required high complexity medical decision-making due to extensive multiple medical comorbidities, length of time needed to review previous medical records, answer questions by the patient and the daughter, clarify medical issues, patient being high risk due to multiple medical issues that are ongoing.   Signed, Wende Bushy, MD  03/13/2016 5:06 PM    [place]

## 2016-03-13 NOTE — Patient Instructions (Signed)
Medication Instructions:  Your physician has recommended you make the following change in your medication:   1. Start taking Lasix 40 mg Twice Daily 2. STOP taking Venlafaxine 3. STOP taking trazodone   Labwork: Your physician recommends that you return for lab work in: BMP in 1 week.  Date & Time: _________________________________________________________  Testing/Procedures: None Ordered  Follow-Up: Your physician recommends that you schedule a follow-up appointment in: 1 month with Dr. Yvone Neu  Date & Time: __________________________________________________________   Any Other Special Instructions Will Be Listed Below (If Applicable).     If you need a refill on your cardiac medications before your next appointment, please call your pharmacy.

## 2016-03-14 ENCOUNTER — Telehealth: Payer: Self-pay | Admitting: Cardiology

## 2016-03-14 MED ORDER — FUROSEMIDE 40 MG PO TABS: 40.0000 mg | ORAL_TABLET | Freq: Two times a day (BID) | ORAL | Status: DC

## 2016-03-14 NOTE — Telephone Encounter (Signed)
Furosemide 40 mg twice daily prescription sent in to pharmacy. Patient verbalized understanding of instructions and told her to call back if she should have any problems.

## 2016-03-14 NOTE — Telephone Encounter (Signed)
Pt states she was called in Furosemide yesterday, states rx was called in 1 pill per day, instead of 2 . Please call.

## 2016-03-15 ENCOUNTER — Telehealth: Payer: Self-pay | Admitting: Internal Medicine

## 2016-03-15 DIAGNOSIS — G4733 Obstructive sleep apnea (adult) (pediatric): Secondary | ICD-10-CM

## 2016-03-15 NOTE — Telephone Encounter (Signed)
Order placed

## 2016-03-15 NOTE — Telephone Encounter (Signed)
Pt called while at [facility] in [place], being set up on CPAP machine.  Pt states "that there is no way that she will be able to tolerate a pressure of 19 cm H20.  Spoke with the RT at [facility], [place] and she stated that pt would only be able to tolerate a pressure of 14 or 15 cm H20.  Called Dr. Ashby Dawes in ICU and he stated to have CPAP pressure set at 14 cm H20. Crystal took a verbal order and will need to have order placed for [facility] to set CPAP at 14 cm H20. Please place referral in for [facility] to set CPAP at 14 cm H20. Rhonda J Cobb

## 2016-03-16 ENCOUNTER — Other Ambulatory Visit: Payer: Self-pay | Admitting: Internal Medicine

## 2016-03-19 ENCOUNTER — Ambulatory Visit: Admission: RE | Admit: 2016-03-19 | Payer: Medicare Other | Source: Ambulatory Visit

## 2016-03-20 ENCOUNTER — Ambulatory Visit
Admission: RE | Admit: 2016-03-20 | Discharge: 2016-03-20 | Disposition: A | Discharge status: Home / Self Care | Payer: Medicare Other | Source: Ambulatory Visit | Attending: Otolaryngology | Admitting: Otolaryngology

## 2016-03-20 DIAGNOSIS — E041 Nontoxic single thyroid nodule: Secondary | ICD-10-CM

## 2016-03-21 ENCOUNTER — Other Ambulatory Visit: Payer: Medicare Other

## 2016-04-01 ENCOUNTER — Emergency Department: Payer: Medicare Other

## 2016-04-01 ENCOUNTER — Inpatient Hospital Stay
Admission: EM | Admit: 2016-04-01 | Discharge: 2016-04-03 | DRG: 871 | Disposition: A | Discharge status: Other Acute Inpatient Hospital | Payer: Medicare Other | Attending: Internal Medicine | Admitting: Internal Medicine

## 2016-04-01 ENCOUNTER — Telehealth: Payer: Self-pay | Admitting: Internal Medicine

## 2016-04-01 ENCOUNTER — Encounter: Payer: Self-pay | Admitting: Emergency Medicine

## 2016-04-01 DIAGNOSIS — R32 Unspecified urinary incontinence: Secondary | ICD-10-CM | POA: Diagnosis present

## 2016-04-01 DIAGNOSIS — I776 Arteritis, unspecified: Secondary | ICD-10-CM | POA: Diagnosis present

## 2016-04-01 DIAGNOSIS — A419 Sepsis, unspecified organism: Secondary | ICD-10-CM | POA: Diagnosis present

## 2016-04-01 DIAGNOSIS — J441 Chronic obstructive pulmonary disease with (acute) exacerbation: Secondary | ICD-10-CM | POA: Diagnosis present

## 2016-04-01 DIAGNOSIS — I482 Chronic atrial fibrillation: Secondary | ICD-10-CM | POA: Diagnosis present

## 2016-04-01 DIAGNOSIS — Z6830 Body mass index (BMI) 30.0-30.9, adult: Secondary | ICD-10-CM | POA: Diagnosis not present

## 2016-04-01 DIAGNOSIS — G2581 Restless legs syndrome: Secondary | ICD-10-CM | POA: Diagnosis present

## 2016-04-01 DIAGNOSIS — G4733 Obstructive sleep apnea (adult) (pediatric): Secondary | ICD-10-CM | POA: Diagnosis present

## 2016-04-01 DIAGNOSIS — Z800 Family history of malignant neoplasm of digestive organs: Secondary | ICD-10-CM

## 2016-04-01 DIAGNOSIS — R918 Other nonspecific abnormal finding of lung field: Secondary | ICD-10-CM

## 2016-04-01 DIAGNOSIS — I110 Hypertensive heart disease with heart failure: Secondary | ICD-10-CM | POA: Diagnosis present

## 2016-04-01 DIAGNOSIS — E669 Obesity, unspecified: Secondary | ICD-10-CM | POA: Diagnosis present

## 2016-04-01 DIAGNOSIS — Z9981 Dependence on supplemental oxygen: Secondary | ICD-10-CM

## 2016-04-01 DIAGNOSIS — Z823 Family history of stroke: Secondary | ICD-10-CM | POA: Diagnosis not present

## 2016-04-01 DIAGNOSIS — M300 Polyarteritis nodosa: Secondary | ICD-10-CM | POA: Diagnosis present

## 2016-04-01 DIAGNOSIS — Z8249 Family history of ischemic heart disease and other diseases of the circulatory system: Secondary | ICD-10-CM

## 2016-04-01 DIAGNOSIS — I5032 Chronic diastolic (congestive) heart failure: Secondary | ICD-10-CM | POA: Diagnosis present

## 2016-04-01 DIAGNOSIS — Z79899 Other long term (current) drug therapy: Secondary | ICD-10-CM | POA: Diagnosis not present

## 2016-04-01 DIAGNOSIS — E119 Type 2 diabetes mellitus without complications: Secondary | ICD-10-CM | POA: Diagnosis present

## 2016-04-01 DIAGNOSIS — R0602 Shortness of breath: Secondary | ICD-10-CM

## 2016-04-01 DIAGNOSIS — J440 Chronic obstructive pulmonary disease with acute lower respiratory infection: Secondary | ICD-10-CM | POA: Diagnosis present

## 2016-04-01 DIAGNOSIS — Z7984 Long term (current) use of oral hypoglycemic drugs: Secondary | ICD-10-CM

## 2016-04-01 DIAGNOSIS — J9601 Acute respiratory failure with hypoxia: Secondary | ICD-10-CM | POA: Diagnosis not present

## 2016-04-01 DIAGNOSIS — J189 Pneumonia, unspecified organism: Secondary | ICD-10-CM

## 2016-04-01 DIAGNOSIS — Z87891 Personal history of nicotine dependence: Secondary | ICD-10-CM | POA: Diagnosis not present

## 2016-04-01 DIAGNOSIS — Z7901 Long term (current) use of anticoagulants: Secondary | ICD-10-CM

## 2016-04-01 DIAGNOSIS — K219 Gastro-esophageal reflux disease without esophagitis: Secondary | ICD-10-CM | POA: Diagnosis present

## 2016-04-01 DIAGNOSIS — D849 Immunodeficiency, unspecified: Secondary | ICD-10-CM | POA: Diagnosis not present

## 2016-04-01 DIAGNOSIS — R768 Other specified abnormal immunological findings in serum: Secondary | ICD-10-CM

## 2016-04-01 HISTORY — DX: Chronic obstructive pulmonary disease, unspecified: J44.9

## 2016-04-01 LAB — COMPREHENSIVE METABOLIC PANEL
ALT: 32 U/L (ref 14–54)
ANION GAP: 13 (ref 5–15)
AST: 68 U/L — ABNORMAL HIGH (ref 15–41)
Albumin: 3.6 g/dL (ref 3.5–5.0)
Alkaline Phosphatase: 43 U/L (ref 38–126)
BUN: 16 mg/dL (ref 6–20)
CALCIUM: 8.4 mg/dL — AB (ref 8.9–10.3)
CHLORIDE: 94 mmol/L — AB (ref 101–111)
CO2: 24 mmol/L (ref 22–32)
CREATININE: 0.66 mg/dL (ref 0.44–1.00)
GFR calc non Af Amer: >60 mL/min (ref >60)
GLUCOSE: 112 mg/dL — AB (ref 65–99)
POTASSIUM: 3.2 mmol/L — AB (ref 3.5–5.1)
SODIUM: 131 mmol/L — AB (ref 135–145)
Total Bilirubin: 0.7 mg/dL (ref 0.3–1.2)
Total Protein: 7 g/dL (ref 6.5–8.1)

## 2016-04-01 LAB — URINALYSIS COMPLETE WITH MICROSCOPIC (ARMC ONLY)
BILIRUBIN URINE: NEGATIVE
GLUCOSE, UA: NEGATIVE mg/dL
Ketones, ur: NEGATIVE mg/dL
LEUKOCYTES UA: NEGATIVE
Nitrite: NEGATIVE
Protein, ur: NEGATIVE mg/dL
Specific Gravity, Urine: 1.005 (ref 1.005–1.030)
pH: 7 (ref 5.0–8.0)

## 2016-04-01 LAB — CBC
HEMATOCRIT: 30.1 % — AB (ref 35.0–47.0)
HEMOGLOBIN: 9.5 g/dL — AB (ref 12.0–16.0)
MCH: 23.6 pg — AB (ref 26.0–34.0)
MCHC: 31.6 g/dL — ABNORMAL LOW (ref 32.0–36.0)
MCV: 74.5 fL — ABNORMAL LOW (ref 80.0–100.0)
Platelets: 280 10*3/uL (ref 150–440)
RBC: 4.04 MIL/uL (ref 3.80–5.20)
RDW: 19.5 % — ABNORMAL HIGH (ref 11.5–14.5)
WBC: 7.2 10*3/uL (ref 3.6–11.0)

## 2016-04-01 LAB — CBC WITH DIFFERENTIAL/PLATELET
BASOS PCT: 0 %
Basophils Absolute: 0 10*3/uL (ref 0–0.1)
EOS ABS: 0 10*3/uL (ref 0–0.7)
Eosinophils Relative: 0 %
HCT: 30.5 % — ABNORMAL LOW (ref 35.0–47.0)
HEMOGLOBIN: 9.8 g/dL — AB (ref 12.0–16.0)
Lymphocytes Relative: 3 %
Lymphs Abs: 0.2 10*3/uL — ABNORMAL LOW (ref 1.0–3.6)
MCH: 23.7 pg — ABNORMAL LOW (ref 26.0–34.0)
MCHC: 32.1 g/dL (ref 32.0–36.0)
MCV: 73.7 fL — ABNORMAL LOW (ref 80.0–100.0)
MONOS PCT: 7 %
Monocytes Absolute: 0.5 10*3/uL (ref 0.2–0.9)
NEUTROS PCT: 90 %
Neutro Abs: 6.2 10*3/uL (ref 1.4–6.5)
PLATELETS: 292 10*3/uL (ref 150–440)
RBC: 4.14 MIL/uL (ref 3.80–5.20)
RDW: 19.4 % — ABNORMAL HIGH (ref 11.5–14.5)
WBC: 6.9 10*3/uL (ref 3.6–11.0)

## 2016-04-01 LAB — PROCALCITONIN: Procalcitonin: <0.1 ng/mL

## 2016-04-01 LAB — CREATININE, SERUM
CREATININE: 0.65 mg/dL (ref 0.44–1.00)
GFR calc Af Amer: >60 mL/min (ref >60)
GFR calc non Af Amer: >60 mL/min (ref >60)

## 2016-04-01 LAB — INFLUENZA PANEL BY PCR (TYPE A & B)
H1N1 flu by pcr: NOT DETECTED
INFLAPCR: NEGATIVE
INFLBPCR: NEGATIVE

## 2016-04-01 LAB — BRAIN NATRIURETIC PEPTIDE: B NATRIURETIC PEPTIDE 5: 126 pg/mL — AB (ref 0.0–100.0)

## 2016-04-01 LAB — TROPONIN I
TROPONIN I: 0.04 ng/mL — AB (ref <0.031)
TROPONIN I: 0.03 ng/mL (ref <0.031)
Troponin I: 0.03 ng/mL (ref <0.031)

## 2016-04-01 LAB — GLUCOSE, CAPILLARY
GLUCOSE-CAPILLARY: 111 mg/dL — AB (ref 65–99)
Glucose-Capillary: 99 mg/dL (ref 65–99)
Glucose-Capillary: 282 mg/dL — ABNORMAL HIGH (ref 65–99)

## 2016-04-01 LAB — RAPID INFLUENZA A&B ANTIGENS (ARMC ONLY)
INFLUENZA A (ARMC): NEGATIVE
INFLUENZA B (ARMC): NEGATIVE

## 2016-04-01 LAB — LACTIC ACID, PLASMA
LACTIC ACID, VENOUS: 2.6 mmol/L — AB (ref 0.5–2.0)
Lactic Acid, Venous: 1.5 mmol/L (ref 0.5–2.0)

## 2016-04-01 LAB — PROTIME-INR
INR: 2.08
Prothrombin Time: 23.2 seconds — ABNORMAL HIGH (ref 11.4–15.0)

## 2016-04-01 MED ORDER — SODIUM CHLORIDE 0.9% FLUSH
3.0000 mL | Freq: Two times a day (BID) | INTRAVENOUS | Status: DC
  Administered 2016-04-01 – 2016-04-02 (×3): 3 mL via INTRAVENOUS

## 2016-04-01 MED ORDER — INSULIN ASPART 100 UNIT/ML ~~LOC~~ SOLN
0.0000 [IU] | Freq: Three times a day (TID) | SUBCUTANEOUS | Status: DC
  Administered 2016-04-02: 1 [IU] via SUBCUTANEOUS
  Administered 2016-04-02: 2 [IU] via SUBCUTANEOUS
  Filled 2016-04-01: qty 1
  Filled 2016-04-01: qty 2

## 2016-04-01 MED ORDER — DEXTROSE 5 % IV SOLN
1.0000 g | INTRAVENOUS | Status: DC
  Administered 2016-04-02: 1 g via INTRAVENOUS
  Filled 2016-04-01: qty 10

## 2016-04-01 MED ORDER — DEXTROSE 5 % IV SOLN
500.0000 mg | Freq: Once | INTRAVENOUS | Status: AC
  Administered 2016-04-01: 500 mg via INTRAVENOUS
  Filled 2016-04-01: qty 500

## 2016-04-01 MED ORDER — WARFARIN SODIUM 1 MG PO TABS: 8.0000 mg | ORAL_TABLET | ORAL | Status: DC

## 2016-04-01 MED ORDER — ENOXAPARIN SODIUM 40 MG/0.4ML ~~LOC~~ SOLN
40.0000 mg | SUBCUTANEOUS | Status: DC
  Administered 2016-04-01: 40 mg via SUBCUTANEOUS
  Filled 2016-04-01: qty 0.4

## 2016-04-01 MED ORDER — ACETAMINOPHEN 500 MG PO TABS
1000.0000 mg | ORAL_TABLET | Freq: Once | ORAL | Status: AC
  Administered 2016-04-01: 1000 mg via ORAL

## 2016-04-01 MED ORDER — INSULIN ASPART 100 UNIT/ML ~~LOC~~ SOLN
0.0000 [IU] | Freq: Every day | SUBCUTANEOUS | Status: DC
  Administered 2016-04-01: 3 [IU] via SUBCUTANEOUS
  Filled 2016-04-01: qty 3

## 2016-04-01 MED ORDER — HYDROCODONE-ACETAMINOPHEN 5-325 MG PO TABS
1.0000 | ORAL_TABLET | Freq: Three times a day (TID) | ORAL | Status: DC | PRN
  Administered 2016-04-02: 1 via ORAL
  Filled 2016-04-01: qty 1

## 2016-04-01 MED ORDER — BIOTIN 5000 MCG PO CAPS: 5000.0000 ug | ORAL_CAPSULE | Freq: Every day | ORAL | Status: DC

## 2016-04-01 MED ORDER — DOXYCYCLINE HYCLATE 100 MG IV SOLR
100.0000 mg | Freq: Two times a day (BID) | INTRAVENOUS | Status: DC
  Administered 2016-04-01 – 2016-04-02 (×3): 100 mg via INTRAVENOUS
  Filled 2016-04-01 (×4): qty 100

## 2016-04-01 MED ORDER — LOSARTAN POTASSIUM 50 MG PO TABS
50.0000 mg | ORAL_TABLET | Freq: Every day | ORAL | Status: DC
  Administered 2016-04-02: 50 mg via ORAL
  Filled 2016-04-01 (×2): qty 1

## 2016-04-01 MED ORDER — TRAZODONE HCL 100 MG PO TABS
200.0000 mg | ORAL_TABLET | Freq: Every day | ORAL | Status: DC
  Administered 2016-04-01 – 2016-04-02 (×2): 200 mg via ORAL
  Filled 2016-04-01 (×3): qty 2

## 2016-04-01 MED ORDER — TRAMADOL HCL 50 MG PO TABS
50.0000 mg | ORAL_TABLET | Freq: Four times a day (QID) | ORAL | Status: DC | PRN
  Administered 2016-04-02 (×2): 50 mg via ORAL
  Filled 2016-04-01 (×2): qty 1

## 2016-04-01 MED ORDER — VENLAFAXINE HCL ER 75 MG PO CP24
225.0000 mg | ORAL_CAPSULE | Freq: Every day | ORAL | Status: DC
  Administered 2016-04-02: 225 mg via ORAL
  Filled 2016-04-01 (×2): qty 3

## 2016-04-01 MED ORDER — OXYBUTYNIN CHLORIDE 5 MG PO TABS
5.0000 mg | ORAL_TABLET | Freq: Two times a day (BID) | ORAL | Status: DC
  Administered 2016-04-01 – 2016-04-02 (×3): 5 mg via ORAL
  Filled 2016-04-01 (×3): qty 1

## 2016-04-01 MED ORDER — CALCIUM CARBONATE-VITAMIN D 500-200 MG-UNIT PO TABS
1.0000 | ORAL_TABLET | Freq: Every day | ORAL | Status: DC
  Administered 2016-04-02: 1 via ORAL
  Filled 2016-04-01 (×2): qty 1

## 2016-04-01 MED ORDER — WARFARIN SODIUM 5 MG PO TABS
6.0000 mg | ORAL_TABLET | Freq: Every day | ORAL | Status: DC
  Administered 2016-04-01: 6 mg via ORAL
  Filled 2016-04-01 (×2): qty 1

## 2016-04-01 MED ORDER — WARFARIN SODIUM 1 MG PO TABS: 2.0000 mg | ORAL_TABLET | ORAL | Status: DC

## 2016-04-01 MED ORDER — POTASSIUM CHLORIDE IN NACL 20-0.9 MEQ/L-% IV SOLN
INTRAVENOUS | Status: DC
  Administered 2016-04-01 – 2016-04-02 (×2): via INTRAVENOUS
  Filled 2016-04-01 (×4): qty 1000

## 2016-04-01 MED ORDER — HYDROCODONE-ACETAMINOPHEN 5-325 MG PO TABS
1.0000 | ORAL_TABLET | Freq: Once | ORAL | Status: AC
  Administered 2016-04-01: 1 via ORAL
  Filled 2016-04-01: qty 1

## 2016-04-01 MED ORDER — ACETAMINOPHEN 650 MG RE SUPP: 650.0000 mg | Freq: Four times a day (QID) | RECTAL | Status: DC | PRN

## 2016-04-01 MED ORDER — BUDESONIDE 0.25 MG/2ML IN SUSP
0.2500 mg | Freq: Two times a day (BID) | RESPIRATORY_TRACT | Status: DC
  Administered 2016-04-01 – 2016-04-02 (×2): 0.25 mg via RESPIRATORY_TRACT
  Filled 2016-04-01 (×4): qty 2

## 2016-04-01 MED ORDER — GABAPENTIN 600 MG PO TABS
600.0000 mg | ORAL_TABLET | Freq: Two times a day (BID) | ORAL | Status: DC
  Administered 2016-04-01 – 2016-04-02 (×3): 600 mg via ORAL
  Filled 2016-04-01 (×3): qty 1

## 2016-04-01 MED ORDER — WARFARIN - PHYSICIAN DOSING INPATIENT: Freq: Every day | Status: DC

## 2016-04-01 MED ORDER — ACETAMINOPHEN 325 MG PO TABS: 650.0000 mg | ORAL_TABLET | Freq: Four times a day (QID) | ORAL | Status: DC | PRN

## 2016-04-01 MED ORDER — DEXTROSE 5 % IV SOLN
1.0000 g | Freq: Once | INTRAVENOUS | Status: AC
  Administered 2016-04-01: 1 g via INTRAVENOUS
  Filled 2016-04-01: qty 10

## 2016-04-01 MED ORDER — AZATHIOPRINE 50 MG PO TABS
100.0000 mg | ORAL_TABLET | Freq: Every day | ORAL | Status: DC
  Administered 2016-04-02: 100 mg via ORAL
  Filled 2016-04-01 (×4): qty 2

## 2016-04-01 MED ORDER — ACETAMINOPHEN 500 MG PO TABS
ORAL_TABLET | ORAL | Status: AC
  Administered 2016-04-01: 1000 mg via ORAL
  Filled 2016-04-01: qty 2

## 2016-04-01 MED ORDER — ONDANSETRON HCL 4 MG/2ML IJ SOLN
4.0000 mg | Freq: Four times a day (QID) | INTRAMUSCULAR | Status: DC | PRN
Start: 2016-04-01 — End: 2016-04-01

## 2016-04-01 MED ORDER — OSELTAMIVIR PHOSPHATE 75 MG PO CAPS
75.0000 mg | ORAL_CAPSULE | Freq: Two times a day (BID) | ORAL | Status: DC
  Administered 2016-04-01 – 2016-04-02 (×2): 75 mg via ORAL
  Filled 2016-04-01 (×4): qty 1

## 2016-04-01 MED ORDER — WARFARIN SODIUM 1 MG PO TABS: 1.0000 mg | ORAL_TABLET | ORAL | Status: DC

## 2016-04-01 MED ORDER — PANTOPRAZOLE SODIUM 40 MG PO TBEC: 40.0000 mg | DELAYED_RELEASE_TABLET | Freq: Every day | ORAL | Status: DC

## 2016-04-01 MED ORDER — ONDANSETRON HCL 4 MG PO TABS: 4.0000 mg | ORAL_TABLET | Freq: Four times a day (QID) | ORAL | Status: DC | PRN

## 2016-04-01 MED ORDER — METHYLPREDNISOLONE SODIUM SUCC 125 MG IJ SOLR
80.0000 mg | Freq: Once | INTRAMUSCULAR | Status: AC
  Administered 2016-04-01: 80 mg via INTRAVENOUS
  Filled 2016-04-01: qty 2

## 2016-04-01 MED ORDER — WARFARIN SODIUM 1 MG PO TABS
7.0000 mg | ORAL_TABLET | ORAL | Status: DC
  Administered 2016-04-01 – 2016-04-02 (×2): 7 mg via ORAL
  Filled 2016-04-01: qty 7

## 2016-04-01 MED ORDER — IPRATROPIUM-ALBUTEROL 0.5-2.5 (3) MG/3ML IN SOLN
3.0000 mL | Freq: Four times a day (QID) | RESPIRATORY_TRACT | Status: DC
  Administered 2016-04-01 – 2016-04-02 (×6): 3 mL via RESPIRATORY_TRACT
  Filled 2016-04-01 (×8): qty 3

## 2016-04-01 MED ORDER — ROPINIROLE HCL 1 MG PO TABS
3.0000 mg | ORAL_TABLET | Freq: Every day | ORAL | Status: DC
  Administered 2016-04-01 – 2016-04-02 (×2): 3 mg via ORAL
  Filled 2016-04-01 (×2): qty 3

## 2016-04-01 MED ORDER — DILTIAZEM HCL ER COATED BEADS 240 MG PO CP24
240.0000 mg | ORAL_CAPSULE | Freq: Every day | ORAL | Status: DC
  Administered 2016-04-02: 240 mg via ORAL
  Filled 2016-04-01 (×2): qty 1

## 2016-04-01 NOTE — Telephone Encounter (Signed)
Pt calling stating she went to Walk in clinic at [facility]  And she does not have the flu States she is very SOB very weak not sure what to do Please advise.

## 2016-04-01 NOTE — H&P (Signed)
[facility] NAME: Betty [name]    MR#:  [id number]  DATE OF BIRTH:  19-Aug-1946  DATE OF ADMISSION:  04/01/2016  PRIMARY CARE PHYSICIAN: Betty Body, MD   REQUESTING/REFERRING PHYSICIAN: Dr. Lavonia Drafts  CHIEF COMPLAINT:   Chief Complaint  Patient presents with  . Cough  . Nasal Congestion    HISTORY OF PRESENT ILLNESS:  Betty [name]  is a 70 y.o. female with a known history of Hypertension, osteoarthritis, GERD, chronic afibrillation, diastolic heart failure, diabetes type 2 without complication, COPD, ANCA associated vasculitis, who presents to the hospital due to fever, chills, malaise, cough and congestion. Patient went to the walk-in clinic yesterday with the similar symptoms and was put on Levaquin and Tamiflu even though her flu test was negative. She has not felt any better and continues to have significant shortness of breath, cough, congestion with ongoing fever and malaise. She presented to the emergency room was noted to have fever of 102, noted to be tachycardic, and elevated lactic acid 2.6.  Hospitalist services were contacted further treatment and evaluation. Patient does admit to a cough which is nonproductive in nature. She denies any chest pain, she denies any paroxysmal nocturnal dyspnea orthopnea or any worsening lower extremity edema. She admits to sick contacts as she works at [employer].  PAST MEDICAL HISTORY:   Past Medical History  Diagnosis Date  . Headache(784.0)   . Backache, unspecified   . Herpes zoster without mention of complication   . Osteoarthrosis, unspecified whether generalized or localized, unspecified site   . Nontoxic uninodular goiter   . Obesity, unspecified   . Esophageal reflux   . Unspecified sleep apnea   . Atrial fibrillation ([place])   . Acute diastolic heart failure ([place])   . Cardiomegaly   . Hypertension     heart controlled w CHF  . Asthma   . Diabetes  mellitus without complication ([place])   . Allergy   . Hx: UTI (urinary tract infection)   . Urine incontinence     hx of  . ANCA-associated vasculitis ([place])   . 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 COPD (chronic obstructive pulmonary disease) ([place])     PAST SURGICAL HISTORY:   Past Surgical History  Procedure Laterality Date  . Hysterectomy w unilateral    . Oophorectomy    . Cholecystectomy    . Abdominal hysterectomy  1979    complete (for precancerous cells)  . Ablation  2011 & 2014    SOCIAL HISTORY:   Social History  Substance Use Topics  . Smoking status: Former Smoker -- 0.50 packs/day for 15 years    Types: Cigarettes  . Smokeless tobacco: Never Used     Comment: Has a 20-pack-year history, qutting in 1970.   Marland Kitchen Alcohol Use: No    FAMILY HISTORY:   Family History  Problem Relation Age of Onset  . Heart attack Father   . Heart failure Father   . Arthritis Father   . Stroke Father   . Hypertension Father   . Coronary artery disease Brother   . Peripheral vascular disease Brother   . Arthritis Mother   . Colon cancer Mother     colon cancer  . Hypertension Mother   . Cancer Maternal Grandmother     colon cancer  . Arthritis Maternal Grandmother     DRUG ALLERGIES:   Allergies  Allergen Reactions  .  Amiodarone Other (See Comments)    Pt states that this medication causes lung bleeding.      REVIEW OF SYSTEMS:   Review of Systems  Constitutional: Positive for fever, chills and malaise/fatigue. Negative for weight loss.  HENT: Negative for congestion, nosebleeds and tinnitus.   Eyes: Negative for blurred vision, double vision and redness.  Respiratory: Positive for cough and shortness of breath. Negative for hemoptysis.   Cardiovascular: Negative for chest pain, orthopnea, leg swelling and PND.  Gastrointestinal: Negative for nausea, vomiting, abdominal pain, diarrhea and melena.  Genitourinary: Negative for dysuria,  urgency and hematuria.  Musculoskeletal: Negative for joint pain and falls.  Neurological: Positive for weakness. Negative for dizziness, tingling, sensory change, focal weakness, seizures and headaches.  Endo/Heme/Allergies: Negative for polydipsia. Does not bruise/bleed easily.  Psychiatric/Behavioral: Negative for depression and memory loss. The patient is not nervous/anxious.     MEDICATIONS AT HOME:   Prior to Admission medications   Medication Sig Start Date End Date Taking? Authorizing Provider  acetaminophen (TYLENOL) 500 MG tablet Take 1,000 mg by mouth every 6 (six) hours as needed for mild pain or headache.   Yes Historical Provider, MD  albuterol (PROVENTIL HFA;VENTOLIN HFA) 108 (90 Base) MCG/ACT inhaler Inhale 2 puffs into the lungs every 4 (four) hours as needed for wheezing or shortness of breath. 01/17/16  Yes Flora Lipps, MD  azaTHIOprine (IMURAN) 50 MG tablet Take 100 mg by mouth daily.    Yes Historical Provider, MD  Biotin 5000 MCG CAPS Take 5,000 mcg by mouth daily.    Yes Historical Provider, MD  calcium-vitamin D (OSCAL WITH D) 500-200 MG-UNIT per tablet Take 1 tablet by mouth daily.   Yes Historical Provider, MD  diltiazem (CARDIZEM CD) 240 MG 24 hr capsule Take 240 mg by mouth daily.   Yes Historical Provider, MD  Fluticasone Furoate-Vilanterol 100-25 MCG/INH AEPB Inhale 1 puff into the lungs daily. 01/17/16  Yes Flora Lipps, MD  furosemide (LASIX) 40 MG tablet Take 40 mg by mouth daily.   Yes Historical Provider, MD  gabapentin (NEURONTIN) 600 MG tablet Take 600 mg by mouth 2 (two) times daily.   Yes Historical Provider, MD  HYDROcodone-acetaminophen (NORCO/VICODIN) 5-325 MG tablet Take 1 tablet by mouth every 8 (eight) hours as needed for moderate pain.   Yes Historical Provider, MD  levofloxacin (LEVAQUIN) 500 MG tablet Take 500 mg by mouth daily. 03/31/16 04/10/16 Yes Historical Provider, MD  losartan (COZAAR) 50 MG tablet Take 50 mg by mouth daily.   Yes Historical  Provider, MD  metFORMIN (GLUCOPHAGE) 1000 MG tablet Take 1,000 mg by mouth 2 (two) times daily with a meal.   Yes Historical Provider, MD  oseltamivir (TAMIFLU) 75 MG capsule Take 75 mg by mouth 2 (two) times daily. 03/31/16 04/05/16 Yes Historical Provider, MD  oxybutynin (DITROPAN) 5 MG tablet Take 5 mg by mouth 2 (two) times daily.   Yes Historical Provider, MD  pantoprazole (PROTONIX) 40 MG tablet Take 40 mg by mouth daily.   Yes Historical Provider, MD  rOPINIRole (REQUIP) 3 MG tablet Take 3 mg by mouth at bedtime.   Yes Historical Provider, MD  traMADol (ULTRAM) 50 MG tablet Take 50 mg by mouth every 6 (six) hours as needed for moderate pain.   Yes Historical Provider, MD  traZODone (DESYREL) 100 MG tablet Take 200 mg by mouth at bedtime.   Yes Historical Provider, MD  triamterene-hydrochlorothiazide (MAXZIDE-25) 37.5-25 MG tablet Take 1 tablet by mouth daily.   Yes Historical  Provider, MD  Venlafaxine HCl 225 MG TB24 Take 225 mg by mouth daily.   Yes Historical Provider, MD  warfarin (COUMADIN) 1 MG tablet Take 1-2 mg by mouth at bedtime. Pt takes one tablet Monday-Friday and two tablets on Saturday and Sunday.   Yes Historical Provider, MD  warfarin (COUMADIN) 6 MG tablet Take 6 mg by mouth at bedtime. Pt takes with one 1mg  tablet Monday-Friday.      Pt takes with two 1mg  tablets Saturday and Sunday.   Yes Historical Provider, MD      VITAL SIGNS:  Blood pressure 100/64, pulse 62, temperature 102.4 F (39.1 C), temperature source Oral, resp. rate 17, height 5\' 9"  (1.753 m), weight 104.327 kg (230 lb), SpO2 98 %.  PHYSICAL EXAMINATION:  Physical Exam  GENERAL:  70 y.o.-year-old patient lying in the bed in mild distress.  EYES: Pupils equal, round, reactive to light and accommodation. No scleral icterus. Extraocular muscles intact.  HEENT: Head atraumatic, normocephalic. Oropharynx and nasopharynx clear. No oropharyngeal erythema, moist oral mucosa  NECK:  Supple, no jugular venous  distention. No thyroid enlargement, no tenderness.  LUNGS: Diffuse wheezing, rhonchi bilaterally. Negative use of accessory muscles. No dullness to percussion. CARDIOVASCULAR: S1, S2 RRR. No murmurs, rubs, gallops, clicks.  ABDOMEN: Soft, nontender, nondistended. Bowel sounds present. No organomegaly or mass.  EXTREMITIES: No pedal edema, cyanosis, or clubbing. + 2 pedal & radial pulses b/l.   NEUROLOGIC: Cranial nerves II through XII are intact. No focal Motor or sensory deficits appreciated b/l PSYCHIATRIC: The patient is alert and oriented x 3. Good affect.  SKIN: No obvious rash, lesion, or ulcer.   LABORATORY PANEL:   CBC  Recent Labs Lab 04/01/16 1244  WBC 6.9  HGB 9.8*  HCT 30.5*  PLT 292   ------------------------------------------------------------------------------------------------------------------  Chemistries   Recent Labs Lab 04/01/16 1244  NA 131*  K 3.2*  CL 94*  CO2 24  GLUCOSE 112*  BUN 16  CREATININE 0.66  CALCIUM 8.4*  AST 68*  ALT 32  ALKPHOS 43  BILITOT 0.7   ------------------------------------------------------------------------------------------------------------------  Cardiac Enzymes  Recent Labs Lab 04/01/16 1244  TROPONINI 0.04*   ------------------------------------------------------------------------------------------------------------------  RADIOLOGY:  Dg Chest Port 1 View  04/01/2016  CLINICAL DATA:  Cough and chest congestion.  Diaphoresis. EXAM: PORTABLE CHEST 1 VIEW COMPARISON:  Chest x-ray dated 12/16/2011 and CT scan dated 10/27/2015 FINDINGS: There are new diffuse hazy bilateral pulmonary infiltrates. Overall heart size is normal and the pulmonary vascularity is normal. No discrete effusions. Calcification in the thoracic aorta. Bones are normal. IMPRESSION: New diffuse bilateral hazy pulmonary infiltrates, nonspecific. The finding could represent pulmonary edema. Electronically Signed   By: Lorriane Shire M.D.   On:  04/01/2016 13:19     IMPRESSION AND PLAN:   70 year old female with past medical history of COPD, essential hypertension, diabetes, history of ANCA associated vasculitis, GERD, chronic afibrillation, diastolic CHF, who presents to the hospital due to fever or chills malaise cough and noted to be septic.  1. Sepsis-patient meets criteria given elevated lactic acid, tachycardia, fever. -The services suspected flu/superimposed pneumonia. -Continue Tamiflu, I will add ceftriaxone, doxycycline for pneumonia coverage. -Follow fever curve, cultures.  2. Flu-she is influenza antigen is actually negative. -Although her clinical symptoms are suspicious for a. Empirically continue Tamiflu. -Check for PCR.  3. Pneumonia-community acquired superimposed on the floor. -Continue ceftriaxone, Zithromax. Follow blood, sputum cultures.  4. COPD-mild acute exacerbation given the illness as mentioned above. -I will give her 1 dose of  IV steroids, placed on DuoNeb scheduled, Pulmicort nebs. -I will get a pulmonary consult and discussed the case with Dr. Leonidas Romberg over the phone.  5. History of ANCA associated vasculitis-continue azathioprine.  6. History of chronic afibrillation-rate controlled. Continue Cardizem. -Continue Coumadin. INR therapeutic.  7. Diabetes type 2 without consultation-continue sliding scale insulin. Carb-controlled diet.  8. History of restless leg syndrome-continue Requip.  9. Urinary incontinence-continue oxybutynin.    All the records are reviewed and case discussed with ED provider. Management plans discussed with the patient, family and they are in agreement.  CODE STATUS: Full  TOTAL TIME TAKING CARE OF THIS PATIENT: 45 minutes.    Henreitta Leber M.D on 04/01/2016 at 3:38 PM  Between 7am to 6pm - Pager - [phone]  After 6pm go to [email or web address] - password [email or web address]  Office  [phone]  CC: Primary care physician;  Betty Body, MD

## 2016-04-01 NOTE — Telephone Encounter (Signed)
Spoke with pt on the phone and she states she went to the walk in clinic yesterday and that she doesn't have the flu. Pt is SOB when talking on the phone. Ask pt what the provider did for her SOB yesterday and she states they didn't say anything about the SOB. Pt states she feels she is struggling with the SOB. Pt informed to go to ER. Spoke with KK in regards to this and he states pt needs to go to ER. Pt informed of recommendation and stated "ok". Nothing further needed.

## 2016-04-01 NOTE — ED Notes (Signed)
Pt went to walk in clinic yesterday, tamiflu and antibiotics given, flu test negative.

## 2016-04-01 NOTE — ED Notes (Signed)
Pt in via EMS from home with complaints of cough, congestion x 3 days.  EMS reports sat of 83% per first responder; placed on NRB mask to maintain sat of 95%.  Pt on NRB upon arrival; taken off NRB sats down to 81%, placed on 6L nasal cannula with current sat of 96%.  Pt w/ hx of COPD, does not currently use oxygen at home.  Pt appears weak, diaphoretic, temp 102.4, tachypneic.  Pt complains of headache.

## 2016-04-01 NOTE — Progress Notes (Signed)
communicated with Dr. Jannifer Franklin about patient current BS 282, informed that patient is currently on steroids due to respiratory status. Patient also expressed that she usually takes 7mg  of warfarin at bedtime instead of 6mg . New order: sliding scale added and Dr. Jannifer Franklin will communicate with pharmacy about warfarin dosage. Will continue to monitor.

## 2016-04-01 NOTE — ED Provider Notes (Signed)
[facility] Emergency Department Provider Note  ____________________________________________    I have reviewed the triage vital signs and the nursing notes.   HISTORY  Chief Complaint Cough and Nasal Congestion    HPI Betty [name] is a 70 y.o. female who presents with complaints of shortness of breath and cough. She reports this started approximately 3 days ago. Upon EMS arrival patient was satting 83% on room air. They started her on nonrebreather which did improve her saturation. 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 She denies fevers at home but has had chills. She denies chest pain. She does report productive cough. No recent travel. Distant history of smoking. No calf pain or swelling.     Past Medical History  Diagnosis Date  . Headache(784.0)   . Backache, unspecified   . Herpes zoster without mention of complication   . Osteoarthrosis, unspecified whether generalized or localized, unspecified site   . Nontoxic uninodular goiter   . Obesity, unspecified   . Esophageal reflux   . Unspecified sleep apnea   . Atrial fibrillation ([place])   . Acute diastolic heart failure ([place])   . Cardiomegaly   . Hypertension     heart controlled w CHF  . Asthma   . Diabetes mellitus without complication ([place])   . Allergy   . Hx: UTI (urinary tract infection)   . Urine incontinence     hx of  . 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 COPD (chronic obstructive pulmonary disease) [facility])     Patient Active Problem List   Diagnosis Date Noted  . Prolonged Q-T interval on ECG 03/13/2016  . Mitral stenosis 03/13/2016  . Essential hypertension, benign 03/13/2016  . Diastolic congestive heart failure ([place]) 03/13/2016  . Obesity (BMI 30-39.9) 01/07/2015  . Abnormal EKG 09/11/2014  . External nasal lesion 02/26/2014  . Abnormal liver function tests  01/28/2014  . Ankle pain, right 01/28/2014  . Urinary frequency 12/24/2013  . Hemorrhoids 12/24/2013  . Neck pain on right side 11/10/2013  . Vitamin D deficiency 09/09/2013  . Obstructive sleep apnea 05/30/2013  . Urinary incontinence 05/30/2013  . Diabetes ([place]) 05/30/2013  . History of colonic polyps 05/30/2013  . Depression 04/13/2009  . THYROID NODULE 04/13/2009  . 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 GERD 04/13/2009  . OSTEOARTHRITIS 04/13/2009  . BACK PAIN 04/13/2009  . HEADACHE 04/13/2009  . 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 ATRIAL FIBRILLATION 03/28/2009  . DIASTOLIC HEART FAILURE, ACUTE 03/28/2009  . VENTRICULAR HYPERTROPHY, LEFT 03/28/2009    Past Surgical History  Procedure Laterality Date  . Hysterectomy w unilateral    . Oophorectomy    . Cholecystectomy    . Abdominal hysterectomy  1979    complete (for precancerous cells)  . Ablation  2011 & 2014    Current Outpatient Rx  Name  Route  Sig  Dispense  Refill  . acetaminophen (TYLENOL) 500 MG tablet   Oral   Take 1,000 mg by mouth every 6 (six) hours as needed for mild pain or headache.         . albuterol (PROVENTIL HFA;VENTOLIN HFA) 108 (90 Base) MCG/ACT inhaler   Inhalation   Inhale 2 puffs into the lungs every 4 (four) hours as needed for wheezing or shortness of breath.   1 Inhaler   10   . azaTHIOprine (IMURAN)  50 MG tablet   Oral   Take 100 mg by mouth daily.          . Biotin 5000 MCG CAPS   Oral   Take 5,000 mcg by mouth daily.          . calcium-vitamin D (OSCAL WITH D) 500-200 MG-UNIT per tablet   Oral   Take 1 tablet by mouth daily.         Marland Kitchen diltiazem (CARDIZEM CD) 240 MG 24 hr capsule   Oral   Take 240 mg by mouth daily.         . Fluticasone Furoate-Vilanterol 100-25 MCG/INH AEPB   Inhalation   Inhale 1 puff into the lungs daily.   60 each   5   . furosemide (LASIX) 40 MG tablet   Oral   Take 40 mg by mouth  daily.         Marland Kitchen gabapentin (NEURONTIN) 600 MG tablet   Oral   Take 600 mg by mouth 2 (two) times daily.         Marland Kitchen HYDROcodone-acetaminophen (NORCO/VICODIN) 5-325 MG tablet   Oral   Take 1 tablet by mouth every 8 (eight) hours as needed for moderate pain.         Marland Kitchen levofloxacin (LEVAQUIN) 500 MG tablet   Oral   Take 500 mg by mouth daily.         Marland Kitchen losartan (COZAAR) 50 MG tablet   Oral   Take 50 mg by mouth daily.         . metFORMIN (GLUCOPHAGE) 1000 MG tablet   Oral   Take 1,000 mg by mouth 2 (two) times daily with a meal.         . oseltamivir (TAMIFLU) 75 MG capsule   Oral   Take 75 mg by mouth 2 (two) times daily.         Marland Kitchen oxybutynin (DITROPAN) 5 MG tablet   Oral   Take 5 mg by mouth 2 (two) times daily.         . pantoprazole (PROTONIX) 40 MG tablet   Oral   Take 40 mg by mouth daily.         Marland Kitchen rOPINIRole (REQUIP) 3 MG tablet   Oral   Take 3 mg by mouth at bedtime.         . traMADol (ULTRAM) 50 MG tablet   Oral   Take 50 mg by mouth every 6 (six) hours as needed for moderate pain.         . traZODone (DESYREL) 100 MG tablet   Oral   Take 200 mg by mouth at bedtime.         . triamterene-hydrochlorothiazide (MAXZIDE-25) 37.5-25 MG tablet   Oral   Take 1 tablet by mouth daily.         . Venlafaxine HCl 225 MG TB24   Oral   Take 225 mg by mouth daily.         Marland Kitchen warfarin (COUMADIN) 1 MG tablet   Oral   Take 1-2 mg by mouth at bedtime. Pt takes one tablet Monday-Friday and two tablets on Saturday and Sunday.         . warfarin (COUMADIN) 6 MG tablet   Oral   Take 6 mg by mouth at bedtime. Pt takes with one 1mg  tablet Monday-Friday.      Pt takes with two 1mg  tablets Saturday and Sunday.  Allergies Amiodarone  Family History  Problem Relation Age of Onset  . Heart attack Father   . Heart failure Father   . Arthritis Father   . Stroke Father   . Hypertension Father   . Coronary artery disease Brother    . Peripheral vascular disease Brother   . Arthritis Mother   . Cancer Mother     colon cancer  . Hypertension Mother   . Cancer Maternal Grandmother     colon cancer  . Arthritis Maternal Grandmother     Social History Social History  Substance Use Topics  . Smoking status: Former Smoker -- 1.00 packs/day  . Smokeless tobacco: Never Used     Comment: Has a 20-pack-year history, qutting in 1970.   Marland Kitchen Alcohol Use: No    Review of Systems  Constitutional: Positive for chills Eyes: Negative for redness ENT: Negative for sore throat Cardiovascular: Negative for chest pain Respiratory: As above Gastrointestinal: Negative for abdominal pain Genitourinary: Negative for dysuria. Musculoskeletal: Negative for back pain. Skin: Negative for rash. Neurological: Negative for focal weakness Psychiatric: no anxiety    ____________________________________________   PHYSICAL EXAM:  VITAL SIGNS: ED Triage Vitals  Enc Vitals Group     BP 04/01/16 1204 135/75 mmHg     Pulse Rate 04/01/16 1204 97     Resp 04/01/16 1215 25     Temp 04/01/16 1215 102.4 F (39.1 C)     Temp Source 04/01/16 1215 Oral     SpO2 04/01/16 1204 99 %     Weight 04/01/16 1215 230 lb (104.327 kg)     Height 04/01/16 1215 5\' 9"  (1.753 m)     Head Cir --      Peak Flow --      Pain Score 04/01/16 1217 6     Pain Loc --      Pain Edu? --      Excl. in [place]? --      Constitutional: Alert and oriented. Well appearing and in no distress.  Eyes: Conjunctivae are normal. No erythema or injection ENT   Head: Normocephalic and atraumatic.   Mouth/Throat: Mucous membranes are moist. Cardiovascular: Normal rate, regular rhythm. Normal and symmetric distal pulses are present in the upper extremities.   Respiratory: Increased respiratory effort with tachypnea. Rales bilaterally Gastrointestinal: Soft and non-tender in all quadrants. No distention. There is no CVA tenderness. Genitourinary:  deferred Musculoskeletal: Nontender with normal range of motion in all extremities. No lower extremity tenderness nor edema. Neurologic:  Normal speech and language. No gross focal neurologic deficits are appreciated. Skin:  Skin is warm, dry and intact. No rash noted. Psychiatric: Mood and affect are normal. Patient exhibits appropriate insight and judgment.  ____________________________________________    LABS (pertinent positives/negatives)  Labs Reviewed  LACTIC ACID, PLASMA - Abnormal; Notable for the following:    Lactic Acid, Venous 2.6 (*)    All other components within normal limits  COMPREHENSIVE METABOLIC PANEL - Abnormal; Notable for the following:    Sodium 131 (*)    Potassium 3.2 (*)    Chloride 94 (*)    Glucose, Bld 112 (*)    Calcium 8.4 (*)    AST 68 (*)    All other components within normal limits  CBC WITH DIFFERENTIAL/PLATELET - Abnormal; Notable for the following:    Hemoglobin 9.8 (*)    HCT 30.5 (*)    MCV 73.7 (*)    MCH 23.7 (*)    RDW 19.4 (*)  Lymphs Abs 0.2 (*)    All other components within normal limits  TROPONIN I - Abnormal; Notable for the following:    Troponin I 0.04 (*)    All other components within normal limits  BRAIN NATRIURETIC PEPTIDE - Abnormal; Notable for the following:    B Natriuretic Peptide 126.0 (*)    All other components within normal limits  URINALYSIS COMPLETEWITH MICROSCOPIC ([facility] ONLY) - Abnormal; Notable for the following:    Color, Urine YELLOW (*)    APPearance HAZY (*)    Hgb urine dipstick 2+ (*)    Bacteria, UA RARE (*)    Squamous Epithelial / LPF 6-30 (*)    All other components within normal limits  RAPID INFLUENZA A&B ANTIGENS ([facility] ONLY)  CULTURE, BLOOD (ROUTINE X 2)  CULTURE, BLOOD (ROUTINE X 2)  URINE CULTURE  LACTIC ACID, PLASMA    ____________________________________________   EKG  ED ECG REPORT I, Lavonia Drafts, the attending physician, personally viewed and interpreted this ECG.    Date: 04/01/2016  EKG Time: 12:10 PM  Rate: 94  Rhythm: sinus tachycardia  Axis: Left axis deviation  Intervals:PACs  ST&T Change: Nonspecific   ____________________________________________    RADIOLOGY  Chest x-ray shows bilateral pulmonary infiltrates, nonspecific  ____________________________________________   PROCEDURES  Procedure(s) performed: none  Critical Care performed: yes  CRITICAL CARE Performed by: Lavonia Drafts   Total critical care time: 30 minutes  Critical care time was exclusive of separately billable procedures and treating other patients.  Critical care was necessary to treat or prevent imminent or life-threatening deterioration.  Critical care was time spent personally by me on the following activities: development of treatment plan with patient and/or surrogate as well as nursing, discussions with consultants, evaluation of patient's response to treatment, examination of patient, obtaining history from patient or surrogate, ordering and performing treatments and interventions, ordering and review of laboratory studies, ordering and review of radiographic studies, pulse oximetry and re-evaluation of patient's condition.   ____________________________________________   INITIAL IMPRESSION / ASSESSMENT AND PLAN / ED COURSE  Pertinent labs & imaging results that were available during my care of the patient were reviewed by me and considered in my medical decision making (see chart for details).  Patient presents in respiratory failure. We were able to take her off the nonrebreather and convert her to 6 L nasal cannula but any less than that and her saturations dropped rapidly. Patient is febrile and immunosuppressed, I have a strong concern for pneumonia. She has not had any hemoptysis but pulmonary hemorrhage is also a possibility. I'll start antibiotics and send blood cultures  ----------------------------------------- 3:06 PM on  04/01/2016 -----------------------------------------  Patient reports feeling significant better. She has not had any hemoptysis. Antibiotics have started I discussed with internal medicine for admission  ____________________________________________   FINAL CLINICAL IMPRESSION(S) / ED DIAGNOSES  Final diagnoses:  Community acquired pneumonia  Acute respiratory failure with hypoxia ([place])          Lavonia Drafts, MD 04/01/16 1506

## 2016-04-01 NOTE — Progress Notes (Signed)

## 2016-04-01 NOTE — Progress Notes (Signed)
ANTICOAGULATION CONSULT NOTE - Initial Consult  Pharmacy Consult for warfarin Indication: atrial fibrillation  Allergies  Allergen Reactions  . Amiodarone Other (See Comments)    Pt states that this medication causes lung bleeding.      Patient Measurements: Height: 5\' 9"  (175.3 cm) Weight: 233 lb 12.8 oz (106.051 kg) IBW/kg (Calculated) : 66.2 Heparin Dosing Weight:   Vital Signs: Temp: 99.3 F (37.4 C) (04/10 1937) Temp Source: Oral (04/10 1937) BP: 112/59 mmHg (04/10 1937) Pulse Rate: 85 (04/10 1937)  Labs:  Recent Labs  04/01/16 1244 04/01/16 1734 04/01/16 2108  HGB 9.8* 9.5*  --   HCT 30.5* 30.1*  --   PLT 292 280  --   LABPROT  --  23.2*  --   INR  --  2.08  --   CREATININE 0.66 0.65  --   TROPONINI 0.04* 0.03 0.03    Estimated Creatinine Clearance: 86.1 mL/min (by C-G formula based on Cr of 0.65).   Medical History: Past Medical History  Diagnosis Date  . Headache(784.0)   . Backache, unspecified   . Herpes zoster without mention of complication   . Osteoarthrosis, unspecified whether generalized or localized, unspecified site   . Nontoxic uninodular goiter   . Obesity, unspecified   . Esophageal reflux   . Unspecified sleep apnea   . Atrial fibrillation ([place])   . Acute diastolic heart failure ([place])   . Cardiomegaly   . Hypertension     heart controlled w CHF  . Asthma   . Diabetes mellitus without complication ([place])   . Allergy   . Hx: UTI (urinary tract infection)   . Urine incontinence     hx of  . 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 COPD (chronic obstructive pulmonary disease) ([facility])     Medications:  Infusions:  . 0.9 % NaCl with KCl 20 mEq / L 100 mL/hr at 04/01/16 1820    Assessment: 69 yof cc cough/nasal congestion being treated for sepsis/PNA. Takes VKA at home for AF. Pharmacy consulted to manage warfarin. Goal of Therapy:  INR 2-3 Monitor platelets by  anticoagulation protocol: Yes   Plan:  Resume home dose of 7 mg po daily Monday through Friday and 8 mg on Saturday and Sunday. Will closely monitor INR since patient has been started on ceftriaxone and doxycycline.   Laural Benes, Pharm.D., BCPS Clinical Pharmacist 04/01/2016,10:39 PM

## 2016-04-01 NOTE — Progress Notes (Signed)
Patient alert and oriented x4. Oriented to room, unit, and call bell. Admission completed. No complaints at this time. Will cont to assess. Skin assessment verified by Carlyle Dolly, RN. Telemetry box verified with Sonia Baller, NT. Wilnette Kales

## 2016-04-01 NOTE — Progress Notes (Signed)
Dr. Lavetta Nielsen notified that patient has no sliding scale ordered. Stated that he would place order. Wilnette Kales

## 2016-04-02 ENCOUNTER — Other Ambulatory Visit: Payer: Medicare Other

## 2016-04-02 ENCOUNTER — Inpatient Hospital Stay: Payer: Medicare Other

## 2016-04-02 DIAGNOSIS — J9601 Acute respiratory failure with hypoxia: Secondary | ICD-10-CM

## 2016-04-02 DIAGNOSIS — R918 Other nonspecific abnormal finding of lung field: Secondary | ICD-10-CM

## 2016-04-02 DIAGNOSIS — D849 Immunodeficiency, unspecified: Secondary | ICD-10-CM

## 2016-04-02 LAB — BASIC METABOLIC PANEL
ANION GAP: 8 (ref 5–15)
ANION GAP: 7 (ref 5–15)
BUN: 18 mg/dL (ref 6–20)
BUN: 20 mg/dL (ref 6–20)
CALCIUM: 8.2 mg/dL — AB (ref 8.9–10.3)
CO2: 27 mmol/L (ref 22–32)
CO2: 28 mmol/L (ref 22–32)
CREATININE: 0.72 mg/dL (ref 0.44–1.00)
Calcium: 8.2 mg/dL — ABNORMAL LOW (ref 8.9–10.3)
Chloride: 97 mmol/L — ABNORMAL LOW (ref 101–111)
Chloride: 97 mmol/L — ABNORMAL LOW (ref 101–111)
Creatinine, Ser: 0.6 mg/dL (ref 0.44–1.00)
GFR calc Af Amer: >60 mL/min (ref >60)
GFR calc Af Amer: >60 mL/min (ref >60)
GFR calc non Af Amer: >60 mL/min (ref >60)
GLUCOSE: 212 mg/dL — AB (ref 65–99)
GLUCOSE: 164 mg/dL — AB (ref 65–99)
POTASSIUM: 3.4 mmol/L — AB (ref 3.5–5.1)
Potassium: 3.9 mmol/L (ref 3.5–5.1)
Sodium: 132 mmol/L — ABNORMAL LOW (ref 135–145)
Sodium: 132 mmol/L — ABNORMAL LOW (ref 135–145)

## 2016-04-02 LAB — BLOOD GAS, ARTERIAL
ACID-BASE EXCESS: 2.6 mmol/L (ref 0.0–3.0)
Allens test (pass/fail): POSITIVE — AB
BICARBONATE: 27.2 meq/L (ref 21.0–28.0)
FIO2: 1
O2 SAT: 99.4 %
PCO2 ART: 41 mmHg (ref 32.0–48.0)
PH ART: 7.43 (ref 7.350–7.450)
Patient temperature: 37
pO2, Arterial: 153 mmHg — ABNORMAL HIGH (ref 83.0–108.0)

## 2016-04-02 LAB — GLUCOSE, CAPILLARY
GLUCOSE-CAPILLARY: 111 mg/dL — AB (ref 65–99)
Glucose-Capillary: 126 mg/dL — ABNORMAL HIGH (ref 65–99)
Glucose-Capillary: 171 mg/dL — ABNORMAL HIGH (ref 65–99)
Glucose-Capillary: 170 mg/dL — ABNORMAL HIGH (ref 65–99)

## 2016-04-02 LAB — BRAIN NATRIURETIC PEPTIDE: B NATRIURETIC PEPTIDE 5: 237 pg/mL — AB (ref 0.0–100.0)

## 2016-04-02 LAB — CBC
HEMATOCRIT: 28.7 % — AB (ref 35.0–47.0)
HEMOGLOBIN: 9.2 g/dL — AB (ref 12.0–16.0)
MCH: 23.7 pg — AB (ref 26.0–34.0)
MCHC: 32 g/dL (ref 32.0–36.0)
MCV: 74.1 fL — ABNORMAL LOW (ref 80.0–100.0)
Platelets: 270 10*3/uL (ref 150–440)
RBC: 3.87 MIL/uL (ref 3.80–5.20)
RDW: 19.1 % — ABNORMAL HIGH (ref 11.5–14.5)
WBC: 5.9 10*3/uL (ref 3.6–11.0)

## 2016-04-02 LAB — TROPONIN I: Troponin I: <0.03 ng/mL (ref <0.031)

## 2016-04-02 LAB — PROTIME-INR
INR: 1.99
INR: 2.09
Prothrombin Time: 22.5 seconds — ABNORMAL HIGH (ref 11.4–15.0)
Prothrombin Time: 23.3 seconds — ABNORMAL HIGH (ref 11.4–15.0)

## 2016-04-02 LAB — PHOSPHORUS: Phosphorus: 4 mg/dL (ref 2.5–4.6)

## 2016-04-02 LAB — PROCALCITONIN

## 2016-04-02 LAB — MRSA PCR SCREENING: MRSA by PCR: NEGATIVE

## 2016-04-02 LAB — MAGNESIUM: Magnesium: 1.6 mg/dL — ABNORMAL LOW (ref 1.7–2.4)

## 2016-04-02 LAB — LACTATE DEHYDROGENASE: LDH: 264 U/L — ABNORMAL HIGH (ref 98–192)

## 2016-04-02 MED ORDER — WARFARIN - PHARMACIST DOSING INPATIENT
Freq: Every day | Status: DC
  Administered 2016-04-02: 18:00:00

## 2016-04-02 MED ORDER — METHYLPREDNISOLONE SODIUM SUCC 125 MG IJ SOLR
60.0000 mg | Freq: Once | INTRAMUSCULAR | Status: AC
  Administered 2016-04-02: 60 mg via INTRAVENOUS

## 2016-04-02 MED ORDER — BUDESONIDE 0.5 MG/2ML IN SUSP
0.5000 mg | Freq: Two times a day (BID) | RESPIRATORY_TRACT | Status: DC
  Administered 2016-04-02: 0.5 mg via RESPIRATORY_TRACT
  Filled 2016-04-02: qty 2

## 2016-04-02 MED ORDER — METHYLPREDNISOLONE SODIUM SUCC 40 MG IJ SOLR
40.0000 mg | Freq: Once | INTRAMUSCULAR | Status: AC
  Administered 2016-04-02: 40 mg via INTRAVENOUS

## 2016-04-02 MED ORDER — METHYLPREDNISOLONE SODIUM SUCC 125 MG IJ SOLR
INTRAMUSCULAR | Status: AC
  Filled 2016-04-02: qty 2

## 2016-04-02 MED ORDER — METHYLPREDNISOLONE SODIUM SUCC 40 MG IJ SOLR
40.0000 mg | Freq: Two times a day (BID) | INTRAMUSCULAR | Status: DC
  Administered 2016-04-02 (×2): 40 mg via INTRAVENOUS
  Filled 2016-04-02 (×2): qty 1

## 2016-04-02 MED ORDER — FUROSEMIDE 10 MG/ML IJ SOLN
INTRAMUSCULAR | Status: AC
  Filled 2016-04-02: qty 4

## 2016-04-02 MED ORDER — MORPHINE SULFATE (PF) 2 MG/ML IV SOLN
1.0000 mg | Freq: Once | INTRAVENOUS | Status: AC
  Administered 2016-04-02: 1 mg via INTRAVENOUS
  Filled 2016-04-02: qty 1

## 2016-04-02 MED ORDER — LORAZEPAM 2 MG/ML IJ SOLN
1.0000 mg | Freq: Four times a day (QID) | INTRAMUSCULAR | Status: DC | PRN
  Administered 2016-04-02: 1 mg via INTRAVENOUS
  Filled 2016-04-02: qty 1

## 2016-04-02 MED ORDER — LORAZEPAM 2 MG/ML IJ SOLN
INTRAMUSCULAR | Status: AC
  Filled 2016-04-02: qty 1

## 2016-04-02 MED ORDER — POTASSIUM CHLORIDE 20 MEQ PO PACK
40.0000 meq | PACK | Freq: Once | ORAL | Status: AC
  Administered 2016-04-02: 40 meq via ORAL
  Filled 2016-04-02: qty 2

## 2016-04-02 MED ORDER — LORAZEPAM 2 MG/ML IJ SOLN
0.5000 mg | Freq: Four times a day (QID) | INTRAMUSCULAR | Status: DC | PRN
  Administered 2016-04-02: 0.5 mg via INTRAVENOUS
  Filled 2016-04-02: qty 1

## 2016-04-02 MED ORDER — CYCLOBENZAPRINE HCL 10 MG PO TABS
5.0000 mg | ORAL_TABLET | Freq: Three times a day (TID) | ORAL | Status: DC | PRN
  Administered 2016-04-02: 5 mg via ORAL
  Filled 2016-04-02: qty 1

## 2016-04-02 MED ORDER — FUROSEMIDE 10 MG/ML IJ SOLN
40.0000 mg | Freq: Once | INTRAMUSCULAR | Status: AC
  Administered 2016-04-02: 40 mg via INTRAVENOUS

## 2016-04-02 NOTE — Progress Notes (Signed)
[facility] Progress Note Patient Name: Betty [name] DOB: Feb 26, 1946 MRN: [id number]   Pt son requested transfer to [place] [facility] as that is where pt's specialists are, including her rheumatologist, I spoke with the pt's son directly. Discussed with [facility] transfer team and MICU fellow. They will accept pt in transfer but have no beds available at this time.  Transfer team will call for patient once bed becomes available (there are no other patients awaiting MICU transfer at this time).      Laverle Hobby 04/02/2016, 6:47 PM

## 2016-04-02 NOTE — Progress Notes (Signed)
Pt with increased WOB, O2 sats obtained 84% on 5 L, pt increased to 6 L.  Pt requested and given, flexaril and hydrocodone.  Pt reports "bad coughing fit caused it".  RT at bedside for breathing treatment.  Awaiting transport to CT.

## 2016-04-02 NOTE — Progress Notes (Signed)
ANTICOAGULATION CONSULT NOTE - Follow up [facility] for warfarin Indication: atrial fibrillation  Allergies  Allergen Reactions  . Amiodarone Other (See Comments)    Pt states that this medication causes lung bleeding.      Patient Measurements: Height: 5\' 9"  (175.3 cm) Weight: 233 lb 12.8 oz (106.051 kg) IBW/kg (Calculated) : 66.2   Vital Signs: Temp: 99.3 F (37.4 C) (04/11 1126) Temp Source: Oral (04/11 1126) BP: 108/77 mmHg (04/11 1126) Pulse Rate: 80 (04/11 1126)  Labs:  Recent Labs  04/01/16 1244 04/01/16 1734 04/01/16 2108 04/02/16 0110 04/02/16 0412  HGB 9.8* 9.5*  --  9.2*  --   HCT 30.5* 30.1*  --  28.7*  --   PLT 292 280  --  270  --   LABPROT  --  23.2*  --  22.5* 23.3*  INR  --  2.08  --  1.99 2.09  CREATININE 0.66 0.65  --  0.60  --   TROPONINI 0.04* 0.03 0.03 <0.03  --     Estimated Creatinine Clearance: 86.1 mL/min (by C-G formula based on Cr of 0.6).   Assessment: 69 yof cc cough/nasal congestion being treated for sepsis/PNA. Takes warfarin at home for AFib. Pharmacy consulted to manage warfarin. Home regimen appears to be warfarin 7 mg Mon-Fri and 8 mg Sat/Sun.    4/10 INR 2.08 - 7 mg warfarin total (looks like 13 mg charted but see comment, RN said she only gave 1 mg + 6 mg prior) 4/11 INR 2.09   Goal of Therapy:  INR 2-3 Monitor platelets by anticoagulation protocol: Yes   Plan:  Will continue home regimen of warfarin 7 mg po daily Monday through Friday and 8 mg on Saturday and Sunday. Will closely monitor INR since patient has been started on doxycycline. INR ordered daily.  Pharmacy will continue to follow.   Rayna Sexton, PharmD, BCPS Clinical Pharmacist 04/02/2016 1:47 PM

## 2016-04-02 NOTE — Progress Notes (Signed)
When patient returned from CT very lethargic and having trouble breathing. Patient placed on nonrebreather, and Dr. Margaretmary Eddy paged. Patient was given solumedrol, IV lasix, and placed on bipap. Patient is now alert and oriented x4, and has been explained plan. Patient will be transferred to ICU and per patient request, family notified of plan as well. Because of anxiety with bipap patient given ativan, foley placed, and ivf d/c'd. vss at this time, report given to [place] in ICU. Wilnette Kales

## 2016-04-02 NOTE — Progress Notes (Signed)
[place] at [place] NAME: Betty [name]    MR#:  [id number]  DATE OF BIRTH:  08-Feb-1946  SUBJECTIVE:  CHIEF COMPLAINT:  Patient is resting comfortably denies any chest pain or shortness of breath at rest. Coughing. Wants to make sure that she doesn't have flu  REVIEW OF SYSTEMS:  CONSTITUTIONAL: No fever, fatigue or weakness.  EYES: No blurred or double vision.  EARS, NOSE, AND THROAT: No tinnitus or ear pain.  RESPIRATORY: Reporting cough, shortness of breath, denies wheezing or hemoptysis.  CARDIOVASCULAR: No chest pain, orthopnea, edema.  GASTROINTESTINAL: No nausea, vomiting, diarrhea or abdominal pain.  GENITOURINARY: No dysuria, hematuria.  ENDOCRINE: No polyuria, nocturia,  HEMATOLOGY: No anemia, easy bruising or bleeding SKIN: No rash or lesion. MUSCULOSKELETAL: No joint pain or arthritis.   NEUROLOGIC: No tingling, numbness, weakness.  PSYCHIATRY: No anxiety or depression.   DRUG ALLERGIES:   Allergies  Allergen Reactions  . Amiodarone Other (See Comments)    Pt states that this medication causes lung bleeding.      VITALS:  Blood pressure 121/92, pulse 86, temperature 99.3 F (37.4 C), temperature source Oral, resp. rate 18, height 5\' 9"  (1.753 m), weight 106.051 kg (233 lb 12.8 oz), SpO2 100 %.  PHYSICAL EXAMINATION:  GENERAL:  70 y.o.-year-old patient lying in the bed with no acute distress. Morbidly obese EYES: Pupils equal, round, reactive to light and accommodation. No scleral icterus. Extraocular muscles intact.  HEENT: Head atraumatic, normocephalic. Oropharynx and nasopharynx clear.  NECK:  Supple, no jugular venous distention. No thyroid enlargement, no tenderness.  LUNGS: Diminished breath sounds bilaterally, no wheezing, rales,rhonchi or crepitation. No use of accessory muscles of respiration.  CARDIOVASCULAR: S1, S2 normal. No murmurs, rubs, or gallops.  ABDOMEN: Soft, nontender, nondistended. Bowel  sounds present. No organomegaly or mass.  EXTREMITIES: No pedal edema, cyanosis, or clubbing.  NEUROLOGIC: Cranial nerves II through XII are intact. Muscle strength 5/5 in all extremities. Sensation intact. Gait not checked.  PSYCHIATRIC: The patient is alert and oriented x 3.  SKIN: No obvious rash, lesion, or ulcer.    LABORATORY PANEL:   CBC  Recent Labs Lab 04/02/16 0110  WBC 5.9  HGB 9.2*  HCT 28.7*  PLT 270   ------------------------------------------------------------------------------------------------------------------  Chemistries   Recent Labs Lab 04/01/16 1244  04/02/16 0110  NA 131*  --  132*  K 3.2*  --  3.4*  CL 94*  --  97*  CO2 24  --  27  GLUCOSE 112*  --  212*  BUN 16  --  18  CREATININE 0.66  < > 0.60  CALCIUM 8.4*  --  8.2*  AST 68*  --   --   ALT 32  --   --   ALKPHOS 43  --   --   BILITOT 0.7  --   --   < > = values in this interval not displayed. ------------------------------------------------------------------------------------------------------------------  Cardiac Enzymes  Recent Labs Lab 04/02/16 0110  TROPONINI <0.03   ------------------------------------------------------------------------------------------------------------------  RADIOLOGY:  Dg Chest 2 View  04/02/2016  CLINICAL DATA:  Increased shortness of breath today. COPD. Cough and chest congestion since yesterday EXAM: CHEST  2 VIEW COMPARISON:  04/01/2016. FINDINGS: The cardiac silhouette remains borderline enlarged. Mildly progressive diffuse prominence of the interstitial markings with some alveolar opacities. No definite pleural fluid is seen. Unremarkable bones. IMPRESSION: Mildly progressive probable pulmonary edema. Pneumonitis is less likely but possible. Electronically Signed   By: Remo Lipps  Joneen Caraway M.D.   On: 04/02/2016 14:02   Dg Chest Port 1 View  04/01/2016  CLINICAL DATA:  Cough and chest congestion.  Diaphoresis. EXAM: PORTABLE CHEST 1 VIEW COMPARISON:  Chest  x-ray dated 12/16/2011 and CT scan dated 10/27/2015 FINDINGS: There are new diffuse hazy bilateral pulmonary infiltrates. Overall heart size is normal and the pulmonary vascularity is normal. No discrete effusions. Calcification in the thoracic aorta. Bones are normal. IMPRESSION: New diffuse bilateral hazy pulmonary infiltrates, nonspecific. The finding could represent pulmonary edema. Electronically Signed   By: Lorriane Shire M.D.   On: 04/01/2016 13:19    EKG:   Orders placed or performed during the hospital encounter of 04/01/16  . EKG 12-Lead  . EKG 12-Lead  . EKG 12-Lead  . EKG 12-Lead    ASSESSMENT AND PLAN:   70 year old female with past medical history of COPD, essential hypertension, diabetes, history of ANCA associated vasculitis, GERD, chronic afibrillation, diastolic CHF, who presents to the hospital due to fever or chills malaise cough and noted to be septic.  1. Sepsis-patient meets criteria given elevated lactic acid, tachycardia, fever. 2/2 PNA -Flu PCR test is negative, discontinue Tamiflu and droplet precautions  ceftriaxone, azithromycin for pneumonia coverage. -Follow fever curve, cultures.  2. Flu-she is influenza antigen is actually negative. -PCR test is negative. Discontinue Tamiflu   3. Pneumonia-community acquired  -Continue ceftriaxone and azithromycin .Follow blood, sputum cultures.  4. COPD-mild acute exacerbation given the illness as mentioned above. -Given 1 dose of IV steroids, placed on DuoNeb scheduled, Pulmicort nebs. -F/U WITH  pulmonary consult and discussed the case with Dr. Leonidas Romberg over the phone.  5. History of ANCA associated vasculitis-continue azathioprine. The patient is refusing by mouth steroids  6. History of chronic afibrillation-rate controlled. Continue Cardizem. -Continue Coumadin. INR therapeutic 2.09 7. Diabetes type 2 without consultation-continue sliding scale insulin. Carb-controlled diet.  8. History of restless leg  syndrome-continue Requip.  9. Urinary incontinence-continue oxybutynin.      All the records are reviewed and case discussed with Care Management/Social Workerr. Management plans discussed with the patient, family and she is  in agreement.  CODE STATUS: fc   TOTAL TIME TAKING CARE OF THIS PATIENT: 35  minutes.   POSSIBLE D/C IN 2-3  DAYS, DEPENDING ON CLINICAL CONDITION.   Nicholes Mango M.D on 04/02/2016 at 3:23 PM  Between 7am to 6pm - Pager - [phone] After 6pm go to [email or web address] - password [email or web address] [facility]  Office  [phone]  CC: Primary care physician; Dion Body, MD

## 2016-04-02 NOTE — Progress Notes (Signed)
[place] at [place] NAME: Betty [name]    MR#:  [id number]  DATE OF BIRTH:  1946-02-01  SUBJECTIVE:  CHIEF COMPLAINT:  RN paged me as pt is on acute resp distress. Patient is reporting shortness of breath with diffuse wheezing. Was given hard but denies any chest pain. She used to smoke 1 pack a day for approximately 20-25 years  REVIEW OF SYSTEMS:  Review of systems Limited as the patient is in respiratory distress RESPIRATORY:   very shortness of breath, diffusely wheezing. No  hemoptysis.  CARDIOVASCULAR: No chest pain, orthopnea, edema.  GASTROINTESTINAL: No nausea, vomiting, diarrhea or abdominal pain.  GENITOURINARY: No dysuria, hematuria.  ENDOCRINE: No polyuria, nocturia,  HEMATOLOGY: No anemia, easy bruising or bleeding SKIN: No rash or lesion. MUSCULOSKELETAL: No joint pain or arthritis.   NEUROLOGIC: No tingling, numbness, weakness.  PSYCHIATRY: No anxiety or depression.   DRUG ALLERGIES:   Allergies  Allergen Reactions  . Amiodarone Other (See Comments)    Pt states that this medication causes lung bleeding.      VITALS:  Blood pressure 121/92, pulse 86, temperature 99.3 F (37.4 C), temperature source Oral, resp. rate 18, height 5\' 9"  (1.753 m), weight 106.051 kg (233 lb 12.8 oz), SpO2 100 %.  PHYSICAL EXAMINATION:  GENERAL:  70 y.o.-year-old patient lying in the bed with no acute distress.  EYES: Pupils equal, round, reactive to light and accommodation. No scleral icterus. Extraocular muscles intact.  HEENT: Head atraumatic, normocephalic. Oropharynx and nasopharynx clear.  NECK:  Supple, no jugular venous distention. No thyroid enlargement, no tenderness.  LUNGS: Diminished breath sounds bilaterally,   Diffuse wheezing, rales,rhonchi or crepitation. Some  use of accessory muscles of respiration.  CARDIOVASCULAR: S1, S2 normal. No murmurs, rubs, or gallops.  ABDOMEN: Soft, nontender, nondistended. Bowel sounds  present. No organomegaly or mass.  EXTREMITIES: No pedal edema, cyanosis, or clubbing.  NEUROLOGIC: Cranial nerves II through XII are intact. Muscle strength 5/5 in all extremities. Sensation intact. Gait not checked.  PSYCHIATRIC: The patient is alert and oriented x 3.  SKIN: No obvious rash, lesion, or ulcer.    LABORATORY PANEL:   CBC  Recent Labs Lab 04/02/16 0110  WBC 5.9  HGB 9.2*  HCT 28.7*  PLT 270   ------------------------------------------------------------------------------------------------------------------  Chemistries   Recent Labs Lab 04/01/16 1244  04/02/16 0110  NA 131*  --  132*  K 3.2*  --  3.4*  CL 94*  --  97*  CO2 24  --  27  GLUCOSE 112*  --  212*  BUN 16  --  18  CREATININE 0.66  < > 0.60  CALCIUM 8.4*  --  8.2*  AST 68*  --   --   ALT 32  --   --   ALKPHOS 43  --   --   BILITOT 0.7  --   --   < > = values in this interval not displayed. ------------------------------------------------------------------------------------------------------------------  Cardiac Enzymes  Recent Labs Lab 04/02/16 0110  TROPONINI <0.03   ------------------------------------------------------------------------------------------------------------------  RADIOLOGY:  Dg Chest 2 View  04/02/2016  CLINICAL DATA:  Increased shortness of breath today. COPD. Cough and chest congestion since yesterday EXAM: CHEST  2 VIEW COMPARISON:  04/01/2016. FINDINGS: The cardiac silhouette remains borderline enlarged. Mildly progressive diffuse prominence of the interstitial markings with some alveolar opacities. No definite pleural fluid is seen. Unremarkable bones. IMPRESSION: Mildly progressive probable pulmonary edema. Pneumonitis is less likely but possible. Electronically  Signed   By: Claudie Revering M.D.   On: 04/02/2016 14:02   Dg Chest Port 1 View  04/01/2016  CLINICAL DATA:  Cough and chest congestion.  Diaphoresis. EXAM: PORTABLE CHEST 1 VIEW COMPARISON:  Chest x-ray  dated 12/16/2011 and CT scan dated 10/27/2015 FINDINGS: There are new diffuse hazy bilateral pulmonary infiltrates. Overall heart size is normal and the pulmonary vascularity is normal. No discrete effusions. Calcification in the thoracic aorta. Bones are normal. IMPRESSION: New diffuse bilateral hazy pulmonary infiltrates, nonspecific. The finding could represent pulmonary edema. Electronically Signed   By: Lorriane Shire M.D.   On: 04/01/2016 13:19    EKG:   Orders placed or performed during the hospital encounter of 04/01/16  . EKG 12-Lead  . EKG 12-Lead  . EKG 12-Lead  . EKG 12-Lead    ASSESSMENT AND PLAN:    70 year old female with past medical history of COPD, essential hypertension, diabetes, history of ANCA associated vasculitis, GERD, chronic afibrillation, diastolic CHF, who presents to the hospital due to fever or chills malaise cough and noted to be septic.   #. Acute resp distress -  ACOPD exacerbation with  pna /pneumonitis - ? With underlying pulmonary edema Place the patient on BiPAP. Will give IV Solu-Medrol and continue IV antibiotics Rocephin and azithromycin Lasix IV CT chest was ordered which is pending at this time Transfer patient to intensive care unit, discussed with Dr. Greggory Brandy for anxiety as needed   #Sepsis-patient meets criteria given elevated lactic acid, tachycardia, fever. 2/2 PNA  -Flu PCR test is negative, discontinue Tamiflu and droplet precautions  ceftriaxone, azithromycin for pneumonia coverage.  -Follow fever curve, cultures.   #. Pneumonia-community acquired  -Continue ceftriaxone and azithromycin .Follow blood, sputum cultures.   #. History of ANCA associated vasculitis-continue azathioprine. The patient is refusing by mouth steroids   #. History of chronic afibrillation-rate controlled. Continue Cardizem.  -Continue Coumadin. INR therapeutic 2.09   7. Diabetes type 2 -continue sliding scale insulin. Carb-controlled diet.   8.  History of restless leg syndrome-continue Requip.  9. Urinary incontinence-continue oxybutynin.        All the records are reviewed and case discussed with Care Management/Social Workerr. Management plans discussed with the patient, and she is  in agreement.  CODE STATUS: fc  TOTAL  CRITICAL CARE TIME TAKING CARE OF THIS PATIENT: 45  minutes.   POSSIBLE D/C IN 3-4  DAYS, DEPENDING ON CLINICAL CONDITION.   Nicholes Mango M.D on 04/02/2016 at 3:31 PM  Between 7am to 6pm - Pager - [phone] After 6pm go to [email or web address] - password [email or web address] [place] [facility]  Office  [phone]  CC: Primary care physician; Dion Body, MD

## 2016-04-02 NOTE — Care Management (Addendum)
Patient has history of copd but does not have chronic home 02.  Admitted with sepsis due to pneumonia and exac of copd.  There was concern for flu but has tested negative.  She is current with her pcp Dr Netty Starring and no issues accessing medical care, obtaining medications, or with transportation.   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 She did seek outpatient assessment for her sx.   Discussed assessment of need for home 02 closer to discharge.  Her son is present during assessment and does not voice any concerns

## 2016-04-02 NOTE — Progress Notes (Signed)
RN spoke with Dr. Stevenson Clinch and made MD aware that patient is in room 13 and that patient answers questions appropriately and follows commands but rolls eyes around and back into her head at times, pupils 83mm bilateral and equally brisk.  Patient does make eye contact when talking with RN.  MD came to room and assessed patient and spoke with her.  No new orders at this time.

## 2016-04-02 NOTE — Progress Notes (Signed)
A&Ox4, watching tv in bed at this time. Remains tachypnic with RR 30 on bipap and labored breathing. RN has explained to patient why she can not eat or drink at this time because of her breathing and bipap being in use. NSR per cardiac monitor. Family visited since coming to ICU.

## 2016-04-02 NOTE — Progress Notes (Addendum)
Pt reports claustrophobia prior to RT starting on Bipap.  Dr. Margaretmary Eddy made aware, awaiting new orders.Betty [name]

## 2016-04-02 NOTE — Consult Note (Signed)
PULMONARY CONSULT NOTE  Requesting MD/Service: [facility]/Hospitalist Service Date of initial consultation: 04/02/16 Reason for consultation: acute hypoxic respiratory failure, pulmonary infiltrates, hist of ANCA positive vasculitis  PT PROFILE: 53 F followed by Dr Mortimer Fries as outpt. She has history of hemoptysis/alveoalr hemorrhage in 2010. This occurred when she was on amiodarone and warfarin. In the course of that evaluation, she was found to be ANCA positive. She refused lung biopsy and was treated with cyclophosphamide initially, then changed to azathioprine which she has been on since 2010 under the care of Dr Rosalita Chessman. She has had no more episodes of hemoptysis. She has not been on amiodarone since 2010.  CT chest 10/27/15: Interstitial lung disease characterized by posterior basilar predominant mild scattered regions of subpleural reticulation, minimal ground-glass opacity and minimal traction bronchiectasis throughout both lungs. No frank honeycombing. 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 The differential considerations include multifocal pneumonia versus pulmonary edema   HPI:  76 F with above pulmonary history ,admitted via ED 04/10 with 2 days of malaise, dyspnea, cough, LE edema and noted ot be febrile in ED. She has been treated with abx, a single dose of methylprednisolone, a single dose of furosemide and nebulized BDs. She indicates that she feels much better than on admission. She remains on supplemental O2 by [place]. She has home O2 which she only wears @ night and reports a diagnosis of "mild" OSA. She denies CP, purulent sputum, hemoptysis and calf tenderness.   Past Medical History  Diagnosis Date  . Headache(784.0)   . Backache, unspecified   . Herpes zoster without mention of complication   . Osteoarthrosis, unspecified  whether generalized or localized, unspecified site   . Nontoxic uninodular goiter   . Obesity, unspecified   . Esophageal reflux   . Unspecified sleep apnea   . Atrial fibrillation ([place])   . Acute diastolic heart failure ([place])   . Cardiomegaly   . Hypertension     heart controlled w CHF  . Asthma   . Diabetes mellitus without complication ([place])   . Allergy   . Hx: UTI (urinary tract infection)   . Urine incontinence     hx of  . ANCA-associated vasculitis ([place])   . 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 COPD (chronic obstructive pulmonary disease) [facility])     Past Surgical History  Procedure Laterality Date  . Hysterectomy w unilateral    . Oophorectomy    . Cholecystectomy    . Abdominal hysterectomy  1979    complete (for precancerous cells)  . Ablation  2011 & 2014    MEDICATIONS: I have reviewed all medications and confirmed regimen as documented  Social History   Social History  . Marital Status: Widowed    Spouse Name: N/A  . Number of Children: 1  . Years of Education: N/A   Occupational History  .     Social History Main Topics  . Smoking status: Former Smoker -- 0.50 packs/day for 15 years    Types: Cigarettes  . Smokeless tobacco: Never Used     Comment: Has a 20-pack-year history, qutting in 1970.   Marland Kitchen Alcohol Use: No  . Drug Use: No  . Sexual Activity: Not on file   Other Topics Concern  . Not on file   Social History Narrative   Lives in [place]  with her husband. 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 Does not routinely exercise.     Family History  Problem Relation Age of Onset  . Heart attack Father   . Heart failure Father   . Arthritis Father   . Stroke Father   . Hypertension Father   . Coronary artery disease Brother   . Peripheral vascular disease Brother   . Arthritis Mother   . Colon cancer Mother     colon cancer  . Hypertension Mother   . Cancer  Maternal Grandmother     colon cancer  . Arthritis Maternal Grandmother     ROS: No unexplained weight loss or weight gain No new focal weakness or sensory deficits No otalgia, hearing loss, visual changes, nasal and sinus symptoms, mouth and throat problems No neck pain or adenopathy No abdominal pain, N/V/D, diarrhea, change in bowel pattern No dysuria, change in urinary pattern No LE edema or calf tenderness   Filed Vitals:   04/02/16 0812 04/02/16 0854 04/02/16 1126 04/02/16 1304  BP:  122/60 108/77   Pulse:  86 80   Temp:   99.3 F (37.4 C)   TempSrc:   Oral   Resp:   18   Height:      Weight:      SpO2: 96%  98% 94%     EXAM:  Gen: WDWN, No overt respiratory distress while @ rest HEENT: NCAT, sclera white, oropharynx normal Neck: Supple without LAN, thyromegaly, JVD Lungs: breath sounds full, very prominent coarse wheezes Cardiovascular: Normal rate, reg rhythm, no murmurs noted Abdomen: Soft, nontender, normal BS Ext: without clubbing, cyanosis, edema Neuro: CNs grossly intact, motor and sensory intact, DTRs symmetric Skin: Limited exam, no lesions noted  DATA:   BMP Latest Ref Rng 04/02/2016 04/01/2016 04/01/2016  Glucose 65 - 99 mg/dL 212(H) - 112(H)  BUN 6 - 20 mg/dL 18 - 16  Creatinine 0.44 - 1.00 mg/dL 0.60 0.65 0.66  Sodium 135 - 145 mmol/L 132(L) - 131(L)  Potassium 3.5 - 5.1 mmol/L 3.4(L) - 3.2(L)  Chloride 101 - 111 mmol/L 97(L) - 94(L)  CO2 22 - 32 mmol/L 27 - 24  Calcium 8.9 - 10.3 mg/dL 8.2(L) - 8.4(L)    CBC Latest Ref Rng 04/02/2016 04/01/2016 04/01/2016  WBC 3.6 - 11.0 K/uL 5.9 7.2 6.9  Hemoglobin 12.0 - 16.0 g/dL 9.2(L) 9.5(L) 9.8(L)  Hematocrit 35.0 - 47.0 % 28.7(L) 30.1(L) 30.5(L)  Platelets 150 - 440 K/uL 270 280 292   BNP: 237 PCT: < 0.10 X 2 Flu PCR:  negative  CXR (04/01/16): diffuse symmetric AS dz - edema pattern    IMPRESSION:   1) Acute respiratory illness with fever, respiratory distress, hypoxemia, pulmonary infiltrates,  wheezing in immunosuppressed patient (azathioprine). Concern for recurrence of alveolar hemorrhage syndrome vs opportunistic infection. Doubt typical bacterial PNA given radiographic pattern and normal PCT  2) ANCA positive with history of alveolar hemorrhage in 2010. Maintained on azathioprine since then. Followed by Dr Wynona Neat, [facility]. Diagnosis of microscopic polyangiitis. No other organ manifestations documented  3) Minimal baseline chronic interstitial lung disease based on CT chest 10/27/15   PLAN:  1) repeat CXR today - this has now been done and reviewed. Little change in bilateral infiltrates 2) HRCT chest ordered 3) DC ceftriaxone. Continue doxycycline for atypical infections 4) will consider FOB to R/O opportunistic infections depending on CT findings 5) agree with nebulized steroids and bronchodilators 6) will add a  modest dose of methylprednisolone (given relative intolerance in past and DM)  Merton Border, MD PCCM service Mobile [phone] Pager [phone] 04/02/2016

## 2016-04-03 NOTE — Progress Notes (Signed)
Pt being transported to [facility] [place] [facility], bed 3. Report given to receiving RN. Pt vitals are stable, and no acute distress.

## 2016-04-03 NOTE — Discharge Summary (Signed)
[facility] NAME: Betty [name]    MR#:  [id number]  DATE OF BIRTH:  [birth year]  DATE OF ADMISSION:  04/01/2016 ADMITTING PHYSICIAN: Henreitta Leber, MD  DATE OF DISCHARGE: No discharge date for patient encounter.  PRIMARY CARE PHYSICIAN: Dion Body, MD    ADMISSION DIAGNOSIS:  Community acquired pneumonia [J18.9] Acute respiratory failure with hypoxia ([place]) [J96.01]  DISCHARGE DIAGNOSIS:  Active Problems:   Sepsis ([place])   SECONDARY DIAGNOSIS:   Past Medical History  Diagnosis Date  . Headache(784.0)   . Backache, unspecified   . Herpes zoster without mention of complication   . Osteoarthrosis, unspecified whether generalized or localized, unspecified site   . Nontoxic uninodular goiter   . Obesity, unspecified   . Esophageal reflux   . Unspecified sleep apnea   . Atrial fibrillation ([place])   . Acute diastolic heart failure ([place])   . Cardiomegaly   . Hypertension     heart controlled w CHF  . Asthma   . Diabetes mellitus without complication ([place])   . Allergy   . Hx: UTI (urinary tract infection)   . Urine incontinence     hx of  . 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 COPD (chronic obstructive pulmonary disease) ([place])     CHIEF COMPLAINT:   Chief Complaint  Patient presents with  . Cough  . Nasal Congestion    HISTORY OF PRESENT ILLNESS ON ADMISSION:  Initially admitted with Sepsis due to pneumonia.  Also covered initially with tamiflu, though influenza by PCR later resulted neg.  Pt is improving, but family requests transfer to [facility]  HOSPITAL COURSE/CURRENT MANAGEMENT:  70 year old female with past medical history of COPD, essential hypertension, diabetes, history of ANCA associated vasculitis, GERD, chronic afibrillation, diastolic CHF, who presents to the hospital due to fever or chills malaise cough and noted to be  septic.   #. Acute resp distress - ACOPD exacerbation with pna /pneumonitis - ? With underlying pulmonary edema Place the patient on BiPAP. Will give IV Solu-Medrol and continue IV antibiotics Rocephin and azithromycin Lasix IV CT chest was ordered which is pending at this time Transfer patient to intensive care unit, discussed with Dr. Greggory Brandy for anxiety as needed  #Sepsis-patient meets criteria given elevated lactic acid, tachycardia, fever. 2/2 PNA  -Flu PCR test is negative, discontinue Tamiflu and droplet precautions  ceftriaxone, azithromycin for pneumonia coverage.  -Follow fever curve, cultures.   #. Pneumonia-community acquired  -Continue ceftriaxone and azithromycin .Follow blood, sputum cultures.   #. History of ANCA associated vasculitis-continue azathioprine. The patient is refusing by mouth steroids   #. History of chronic afibrillation-rate controlled. Continue Cardizem.  -Continue Coumadin. INR therapeutic 2.09   7. Diabetes type 2 -continue sliding scale insulin. Carb-controlled diet.  8. History of restless leg syndrome-continue Requip.  9. Urinary incontinence-continue oxybutynin.   CONSULTS OBTAINED:  Treatment Team:  Wilhelmina Mcardle, MD  DRUG ALLERGIES:   Allergies  Allergen Reactions  . Amiodarone Other (See Comments)    Pt states that this medication causes lung bleeding.      DISCHARGE MEDICATIONS:   Current Discharge Medication List    CONTINUE these medications which have NOT CHANGED   Details  acetaminophen (TYLENOL) 500 MG tablet Take 1,000 mg by mouth every 6 (six) hours as needed for mild pain or headache.    albuterol (PROVENTIL HFA;VENTOLIN  HFA) 108 (90 Base) MCG/ACT inhaler Inhale 2 puffs into the lungs every 4 (four) hours as needed for wheezing or shortness of breath. Qty: 1 Inhaler, Refills: 10   Associated Diagnoses: SOB (shortness of breath)    azaTHIOprine (IMURAN) 50 MG tablet Take 100 mg by mouth daily.      Biotin 5000 MCG CAPS Take 5,000 mcg by mouth daily.     calcium-vitamin D (OSCAL WITH D) 500-200 MG-UNIT per tablet Take 1 tablet by mouth daily.    diltiazem (CARDIZEM CD) 240 MG 24 hr capsule Take 240 mg by mouth daily.    Fluticasone Furoate-Vilanterol 100-25 MCG/INH AEPB Inhale 1 puff into the lungs daily. Qty: 60 each, Refills: 5    furosemide (LASIX) 40 MG tablet Take 40 mg by mouth daily.    gabapentin (NEURONTIN) 600 MG tablet Take 600 mg by mouth 2 (two) times daily.    HYDROcodone-acetaminophen (NORCO/VICODIN) 5-325 MG tablet Take 1 tablet by mouth every 8 (eight) hours as needed for moderate pain.    levofloxacin (LEVAQUIN) 500 MG tablet Take 500 mg by mouth daily.    losartan (COZAAR) 50 MG tablet Take 50 mg by mouth daily.    metFORMIN (GLUCOPHAGE) 1000 MG tablet Take 1,000 mg by mouth 2 (two) times daily with a meal.    oseltamivir (TAMIFLU) 75 MG capsule Take 75 mg by mouth 2 (two) times daily.    oxybutynin (DITROPAN) 5 MG tablet Take 5 mg by mouth 2 (two) times daily.    pantoprazole (PROTONIX) 40 MG tablet Take 40 mg by mouth daily.    rOPINIRole (REQUIP) 3 MG tablet Take 3 mg by mouth at bedtime.    traMADol (ULTRAM) 50 MG tablet Take 50 mg by mouth every 6 (six) hours as needed for moderate pain.    traZODone (DESYREL) 100 MG tablet Take 200 mg by mouth at bedtime.    triamterene-hydrochlorothiazide (MAXZIDE-25) 37.5-25 MG tablet Take 1 tablet by mouth daily.    Venlafaxine HCl 225 MG TB24 Take 225 mg by mouth daily.    !! warfarin (COUMADIN) 1 MG tablet Take 1-2 mg by mouth at bedtime. Pt takes one tablet Monday-Friday and two tablets on Saturday and Sunday.    !! warfarin (COUMADIN) 6 MG tablet Take 6 mg by mouth at bedtime. Pt takes with one 1mg  tablet Monday-Friday.      Pt takes with two 1mg  tablets Saturday and Sunday.     !! - Potential duplicate medications found. Please discuss with provider.      DISCHARGE INSTRUCTIONS:   Discharge  Condition: Stable  Discharge Location: [place]:   Filed Vitals:   04/02/16 2200 04/02/16 2300 04/02/16 2318 04/03/16 0000  BP: 124/71 120/68  113/57  Pulse: 85 75 87 82  Temp:    97.9 F (36.6 C)  TempSrc:    Axillary  Resp: 24 20  27   Height:      Weight:      SpO2: 98% 96% 99% 98%   Wt Readings from Last 3 Encounters:  04/01/16 106.051 kg (233 lb 12.8 oz)  03/13/16 107.956 kg (238 lb)  03/06/16 109.861 kg (242 lb 3.2 oz)    I/O:   Intake/Output Summary (Last 24 hours) at 04/03/16 0106 Last data filed at 04/02/16 2350  Gross per 24 hour  Intake   1524 ml  Output    950 ml  Net    574 ml    DATA REVIEW:   CBC  Recent Labs Lab 04/02/16 0110  WBC 5.9  HGB 9.2*  HCT 28.7*  PLT 270    Chemistries   Recent Labs Lab 04/01/16 1244  04/02/16 1815  NA 131*  < > 132*  K 3.2*  < > 3.9  CL 94*  < > 97*  CO2 24  < > 28  GLUCOSE 112*  < > 164*  BUN 16  < > 20  CREATININE 0.66  < > 0.72  CALCIUM 8.4*  < > 8.2*  MG  --   --  1.6*  AST 68*  --   --   ALT 32  --   --   ALKPHOS 43  --   --   BILITOT 0.7  --   --   < > = values in this interval not displayed.  Cardiac Enzymes  Recent Labs Lab 04/02/16 0110  TROPONINI <0.03    Microbiology Results  Results for orders placed or performed during the hospital encounter of 04/01/16  Urine culture     Status: Abnormal (Preliminary result)   Collection Time: 04/01/16 12:44 PM  Result Value Ref Range Status   Specimen Description URINE, RANDOM  Final   Special Requests NONE  Final   Culture (A)  Final    80,000 COLONIES/mL GRAM NEGATIVE RODS IDENTIFICATION AND SUSCEPTIBILITIES TO FOLLOW    Report Status PENDING  Incomplete  Rapid Influenza A&B Antigens ([place] only)     Status: None   Collection Time: 04/01/16 12:44 PM  Result Value Ref Range Status   Influenza A ([place]) NEGATIVE NEGATIVE Final   Influenza B ([facility]) NEGATIVE NEGATIVE Final  MRSA PCR Screening     Status: None    Collection Time: 04/02/16  4:46 PM  Result Value Ref Range Status   MRSA by PCR NEGATIVE NEGATIVE Final    Comment:        The GeneXpert MRSA Assay (FDA approved for NASAL specimens only), is one component of a comprehensive MRSA colonization surveillance program. It is not intended to diagnose MRSA infection nor to guide or monitor treatment for MRSA infections.     RADIOLOGY:  Dg Chest 2 View  04/02/2016  CLINICAL DATA:  Increased shortness of breath today. COPD. Cough and chest congestion since yesterday EXAM: CHEST  2 VIEW COMPARISON:  04/01/2016. FINDINGS: The cardiac silhouette remains borderline enlarged. Mildly progressive diffuse prominence of the interstitial markings with some alveolar opacities. No definite pleural fluid is seen. Unremarkable bones. IMPRESSION: Mildly progressive probable pulmonary edema. Pneumonitis is less likely but possible. Electronically Signed   By: Claudie Revering M.D.   On: 04/02/2016 14:02   Ct Chest High Resolution  04/02/2016  CLINICAL DATA:  70 year old female with acute hypoxic respiratory failure. History of ANCA positive vasculitis. Shortness of breath for several months, acutely worsening 3 days ago. EXAM: CT CHEST WITHOUT CONTRAST TECHNIQUE: Multidetector CT imaging of the chest was performed following the standard protocol without intravenous contrast. High resolution imaging of the lungs, as well as inspiratory and expiratory imaging, was performed. COMPARISON:  Chest CT 10/27/2015. FINDINGS: Mediastinum/Lymph Nodes: Heart size is borderline enlarged. There is no significant pericardial fluid, thickening or pericardial calcification. There is atherosclerosis of the thoracic aorta, the great vessels of the mediastinum and the coronary arteries, including calcified atherosclerotic plaque in the left main, left anterior descending, left circumflex and right coronary arteries. Multiple borderline enlarged and mildly enlarged mediastinal and bilateral  hilar lymph nodes measuring up to 11 mm in short axis in  the subcarinal nodal station. Esophagus is unremarkable in appearance. No axillary lymphadenopathy. Lungs/Pleura: High-resolution images demonstrate extensive ground-glass attenuation and both intralobular and interlobular septal thickening, resulting in a "crazy paving" appearance scattered randomly throughout the lungs bilaterally, most likely to reflect widespread alveolar hemorrhage. Inspiratory and expiratory imaging is unremarkable. Trace left pleural effusion lying dependently. Upper abdomen: Diffuse low attenuation throughout the visualized hepatic parenchyma, compatible with hepatic steatosis. Musculoskeletal: There are no aggressive appearing lytic or blastic lesions noted in the visualized portions of the skeleton. IMPRESSION: 1. Widespread "crazy paving pattern" in the lungs. This is a nonspecific finding, but given the patient's history of ANCA positive vasculitis, presumably reflects widespread alveolar hemorrhage. 2. Trace left pleural effusion. 3. Borderline enlarged and minimally enlarged mediastinal and bilateral hilar lymph nodes are presumably reactive. 4. Atherosclerosis, including left main and 3 vessel coronary artery disease. Please note that although the presence of coronary artery calcium documents the presence of coronary artery disease, the severity of this disease and any potential stenosis cannot be assessed on this non-gated CT examination. Assessment for potential risk factor modification, dietary therapy or pharmacologic therapy may be warranted, if clinically indicated. 5. Hepatic steatosis. Electronically Signed   By: Vinnie Langton M.D.   On: 04/02/2016 15:56   Dg Chest Port 1 View  04/01/2016  CLINICAL DATA:  Cough and chest congestion.  Diaphoresis. EXAM: PORTABLE CHEST 1 VIEW COMPARISON:  Chest x-ray dated 12/16/2011 and CT scan dated 10/27/2015 FINDINGS: There are new diffuse hazy bilateral pulmonary infiltrates.  Overall heart size is normal and the pulmonary vascularity is normal. No discrete effusions. Calcification in the thoracic aorta. Bones are normal. IMPRESSION: New diffuse bilateral hazy pulmonary infiltrates, nonspecific. The finding could represent pulmonary edema. Electronically Signed   By: Lorriane Shire M.D.   On: 04/01/2016 13:19    EKG:   Orders placed or performed during the hospital encounter of 04/01/16  . EKG 12-Lead  . EKG 12-Lead  . EKG 12-Lead  . EKG 12-Lead     Management plans discussed with the patient and/or family.  CODE STATUS:     Code Status Orders        Start     Ordered   04/01/16 1713  Full code   Continuous     04/01/16 1713    Code Status History    Date Active Date Inactive Code Status Order ID Comments User Context   This patient has a current code status but no historical code status.    Advance Directive Documentation        Most Recent Value   Type of Advance Directive  Healthcare Power of Attorney, Living will   Pre-existing out of facility DNR order (yellow form or pink MOST form)     "MOST" Form in Place?        TOTAL TIME TAKING CARE OF THIS PATIENT: >30 minutes.    James Senn [place] 04/03/2016, 1:06 AM  [facility]  Office  [phone]  CC: Primary care physician; Dion Body, MD

## 2016-04-05 LAB — URINE CULTURE: Culture: 80000 — AB

## 2016-04-06 LAB — CULTURE, BLOOD (ROUTINE X 2)
CULTURE: NO GROWTH
CULTURE: NO GROWTH

## 2016-04-17 ENCOUNTER — Ambulatory Visit: Payer: Medicare Other | Admitting: Cardiology

## 2016-04-17 ENCOUNTER — Encounter: Payer: Self-pay | Admitting: Internal Medicine

## 2016-04-24 DIAGNOSIS — R768 Other specified abnormal immunological findings in serum: Secondary | ICD-10-CM | POA: Insufficient documentation

## 2016-04-30 ENCOUNTER — Encounter: Payer: Medicare Other | Attending: Internal Medicine | Admitting: *Deleted

## 2016-04-30 VITALS — Ht 69.25 in | Wt 227.3 lb

## 2016-04-30 DIAGNOSIS — G4730 Sleep apnea, unspecified: Secondary | ICD-10-CM

## 2016-04-30 DIAGNOSIS — J449 Chronic obstructive pulmonary disease, unspecified: Secondary | ICD-10-CM | POA: Diagnosis present

## 2016-04-30 NOTE — Progress Notes (Signed)
Pulmonary Individual Treatment Plan  Patient Details  Name: Betty [name] MRN: [id number] Date of Birth: June 03, 1946 Referring Provider:    Initial Encounter Date:       Pulmonary Rehab from 04/30/2016 in [facility] Cardiac and Pulmonary Rehab   Date  04/30/16      Visit Diagnosis: Sleep apnea  COPD, mild ([place])  Patient's Home Medications on Admission:  Current outpatient prescriptions:  .  albuterol (PROVENTIL HFA;VENTOLIN HFA) 108 (90 Base) MCG/ACT inhaler, Inhale 2 puffs into the lungs every 4 (four) hours as needed for wheezing or shortness of breath., Disp: 1 Inhaler, Rfl: 10 .  azaTHIOprine (IMURAN) 50 MG tablet, Take 100 mg by mouth daily. , Disp: , Rfl:  .  Biotin 5000 MCG CAPS, Take 5,000 mcg by mouth daily. , Disp: , Rfl:  .  calcium-vitamin D (OSCAL WITH D) 500-200 MG-UNIT per tablet, Take 1 tablet by mouth daily., Disp: , Rfl:  .  diltiazem (CARDIZEM CD) 240 MG 24 hr capsule, Take 240 mg by mouth daily., Disp: , Rfl:  .  furosemide (LASIX) 40 MG tablet, Take 40 mg by mouth daily., Disp: , Rfl:  .  gabapentin (NEURONTIN) 600 MG tablet, Take 600 mg by mouth 2 (two) times daily., Disp: , Rfl:  .  HYDROcodone-acetaminophen (NORCO/VICODIN) 5-325 MG tablet, Take 1 tablet by mouth every 8 (eight) hours as needed for moderate pain., Disp: , Rfl:  .  insulin NPH Human (HUMULIN N,NOVOLIN N) 100 UNIT/ML injection, Inject into the skin., Disp: , Rfl:  .  losartan (COZAAR) 50 MG tablet, Take 50 mg by mouth daily., Disp: , Rfl:  .  metFORMIN (GLUCOPHAGE) 1000 MG tablet, Take 1,000 mg by mouth 2 (two) times daily with a meal., Disp: , Rfl:  .  oxybutynin (DITROPAN) 5 MG tablet, Take 5 mg by mouth 2 (two) times daily., Disp: , Rfl:  .  pantoprazole (PROTONIX) 40 MG tablet, Take 40 mg by mouth daily., Disp: , Rfl:  .  predniSONE (DELTASONE) 10 MG tablet, , Disp: , Rfl:  .  rOPINIRole (REQUIP) 3 MG tablet, Take 3 mg by mouth at bedtime., Disp: , Rfl:  .  sulfamethoxazole-trimethoprim (BACTRIM  DS,SEPTRA DS) 800-160 MG tablet, Take by mouth., Disp: , Rfl:  .  traMADol (ULTRAM) 50 MG tablet, Take 50 mg by mouth every 6 (six) hours as needed for moderate pain., Disp: , Rfl:  .  traZODone (DESYREL) 100 MG tablet, Take 200 mg by mouth at bedtime., Disp: , Rfl:  .  triamterene-hydrochlorothiazide (MAXZIDE-25) 37.5-25 MG tablet, Take 1 tablet by mouth daily., Disp: , Rfl:  .  Venlafaxine HCl 225 MG TB24, Take 225 mg by mouth daily., Disp: , Rfl:  .  acetaminophen (TYLENOL) 500 MG tablet, Take 1,000 mg by mouth every 6 (six) hours as needed for mild pain or headache. Reported on 04/30/2016, Disp: , Rfl:  .  Fluticasone Furoate-Vilanterol 100-25 MCG/INH AEPB, Inhale 1 puff into the lungs daily. (Patient not taking: Reported on 04/30/2016), Disp: 60 each, Rfl: 5 .  warfarin (COUMADIN) 1 MG tablet, Take 1-2 mg by mouth at bedtime. Reported on 04/30/2016, Disp: , Rfl:  .  warfarin (COUMADIN) 6 MG tablet, Take 6 mg by mouth at bedtime. Reported on 04/30/2016, Disp: , Rfl:   Past Medical History: Past Medical History  Diagnosis Date  . Headache(784.0)   . Backache, unspecified   . Herpes zoster without mention of complication   . Osteoarthrosis, unspecified whether generalized or localized, unspecified site   .  Nontoxic uninodular goiter   . Obesity, unspecified   . Esophageal reflux   . Unspecified sleep apnea   . Atrial fibrillation ([place])   . Acute diastolic heart failure ([place])   . Cardiomegaly   . Hypertension     heart controlled w CHF  . Asthma   . Diabetes mellitus without complication ([place])   . Allergy   . Hx: UTI (urinary tract infection)   . Urine incontinence     hx of  . 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 COPD (chronic obstructive pulmonary disease) ([facility])     Tobacco Use: History  Smoking status  . Former Smoker -- 0.50 packs/day for 15 years  . Types: Cigarettes  Smokeless tobacco  . Never Used     Comment: Has a 20-pack-year history, qutting in 1970.     Labs: Recent Review Flowsheet Data    Labs for ITP Cardiac and Pulmonary Rehab Latest Ref Rng 01/03/2014 02/22/2014 08/16/2014 03/07/2015 04/02/2016   Cholestrol 0 - 200 mg/dL - - 150 153 -   LDLCALC 0 - 99 mg/dL - - 97 90 -   HDL >39.00 mg/dL - - 36.30(L) 36.60(L) -   Trlycerides 0.0 - 149.0 mg/dL - - 85.0 130.0 -   Hemoglobin A1c 4.6 - 6.5 % 6.2 6.0 5.9 6.7(H) -   PHART 7.350 - 7.450 - - - - 7.43   PCO2ART 32.0 - 48.0 mmHg - - - - 41   HCO3 21.0 - 28.0 mEq/L - - - - 27.2   O2SAT - - - - - 99.4       ADL UCSD:   Pulmonary Function Assessment:     Pulmonary Function Assessment - 04/30/16 1600    Pulmonary Function Tests   FVC% 84 %  10/18/2015 results   FEV1% 82 %   FEV1/FVC Ratio 76   RV% 67 %   DLCO% 56 %   Breath   Shortness of Breath No  SOB increases with shopping . Does well with daily activities of living.      Exercise Target Goals: Date: 04/30/16  Exercise Program Goal: Individual exercise prescription set with THRR, safety & activity barriers. Participant demonstrates ability to understand and report RPE using BORG scale, to self-measure pulse accurately, and to acknowledge the importance of the exercise prescription.  Exercise Prescription Goal: Starting with aerobic activity 30 plus minutes a day, 3 days per week for initial exercise prescription. Provide home exercise prescription and guidelines that participant acknowledges understanding prior to discharge.  Activity Barriers & Risk Stratification:     Activity Barriers & Cardiac Risk Stratification - 04/30/16 1209    Activity Barriers & Cardiac Risk Stratification   Activity Barriers Joint Problems;Back Problems  Right ankle hurts/aches, does not limit walking activity.  Does sit down to rub it.  Takng Prednisone for her lungs and her ankle is feeling better. Back wil lhurt after certain amount of activity      6 Minute Walk:     6 Minute  Walk      04/30/16 1438       6 Minute Walk   Phase Initial     Distance 1000 feet     Walk Time 6 minutes     MPH 1.9     RPE 12     Perceived Dyspnea  2     Resting HR 72 bpm     Resting  BP 126/60 mmHg     Max Ex. HR 106 bpm     Max Ex. BP 152/70 mmHg        Initial Exercise Prescription:     Initial Exercise Prescription - 04/30/16 1400    Date of Initial Exercise RX and Referring Provider   Date 04/30/16   Oxygen   Oxygen Continuous   Liters 2   Treadmill   MPH 1.7   Grade 0   Minutes 15   Recumbant Bike   Level 2   RPM 40   Watts 20   Minutes 15   NuStep   Level 2   Watts 40   Minutes 15   Recumbant Elliptical   Level 2   RPM 40   Watts 20   Minutes 15   REL-XR   Level 2   Watts 50   Minutes 15   T5 Nustep   Level 2   Watts 40   Minutes 15   Biostep-RELP   Level 3   Watts 40   Minutes 15   Prescription Details   Frequency (times per week) 3   Duration Progress to 45 minutes of aerobic exercise without signs/symptoms of physical distress   Intensity   THRR REST +  30   Ratings of Perceived Exertion 11-15   Perceived Dyspnea 0-4   Progression   Progression Continue to progress workloads to maintain intensity without signs/symptoms of physical distress.   Resistance Training   Training Prescription Yes   Weight 2   Reps 10-15      Perform Capillary Blood Glucose checks as needed.  Exercise Prescription Changes:   Exercise Comments:   Discharge Exercise Prescription (Final Exercise Prescription Changes):    Nutrition:  Target Goals: Understanding of nutrition guidelines, daily intake of sodium 1500mg , cholesterol 200mg , calories 30% from fat and 7% or less from saturated fats, daily to have 5 or more servings of fruits and vegetables.  Biometrics:     Pre Biometrics - 04/30/16 1436    Pre Biometrics   Height 5' 9.25" (1.759 m)   Weight 227 lb 4.8 oz (103.103 kg)   Waist Circumference 48.5 inches   Hip Circumference  50.5 inches   Waist to Hip Ratio 0.96 %   BMI (Calculated) 33.4       Nutrition Therapy Plan and Nutrition Goals:   Nutrition Discharge: Rate Your Plate Scores:   Psychosocial: Target Goals: Acknowledge presence or absence of depression, maximize coping skills, provide positive support system. Participant is able to verbalize types and ability to use techniques and skills needed for reducing stress and depression.  Initial Review & Psychosocial Screening:     Initial Psych Review & Screening - 04/30/16 1213    Initial Review   Current issues with Current Psychotropic Meds  Mikka states she worries over everything. This can increase her stress levels.   Family Dynamics   Good Support System? Yes   Concerns Recent loss of significant other   Comments Husband of 70 years died 09-18-2015. "Best husband in the world"   Barriers   Psychosocial barriers to participate in program There are no identifiable barriers or psychosocial needs.;The patient should benefit from training in stress management and relaxation.   Screening Interventions   Interventions Encouraged to exercise      Quality of Life Scores:     Quality of Life - 04/30/16 1547    Quality of Life Scores   Health/Function Pre 12.86 %  Socioeconomic Pre 28 %   Psych/Spiritual Pre 18 %   Family Pre 27 %   GLOBAL Pre 18.77 %      PHQ-9:     Recent Review Flowsheet Data    Depression screen [facility] 2/9 04/30/2016 12/30/2014 10/01/2013   Decreased Interest 1 0 0   Down, Depressed, Hopeless 1 0 0   PHQ - 2 Score 2 0 0   Altered sleeping 0 - -   Tired, decreased energy 1 - -   Change in appetite 0 - -   Feeling bad or failure about yourself  0 - -   Trouble concentrating 0 - -   Moving slowly or fidgety/restless 0 - -   Suicidal thoughts 0 - -   PHQ-9 Score 3 - -   Difficult doing work/chores Not difficult at all - -      Psychosocial Evaluation and Intervention:   Psychosocial  Re-Evaluation:  Education: Education Goals: Education classes will be provided on a weekly basis, covering required topics. Participant will state understanding/return demonstration of topics presented.  Learning Barriers/Preferences:     Learning Barriers/Preferences - 04/30/16 1212    Learning Barriers/Preferences   Learning Barriers None   Learning Preferences Individual Instruction      Education Topics: Initial Evaluation Education: - Verbal, written and demonstration of respiratory meds, RPE/PD scales, oximetry and breathing techniques. Instruction on use of nebulizers and MDIs: cleaning and proper use, rinsing mouth with steroid doses and importance of monitoring MDI activations.   General Nutrition Guidelines/Fats and Fiber: -Group instruction provided by verbal, written material, models and posters to present the general guidelines for heart healthy nutrition. Gives an explanation and review of dietary fats and fiber.   Controlling Sodium/Reading Food Labels: -Group verbal and written material supporting the discussion of sodium use in heart healthy nutrition. Review and explanation with models, verbal and written materials for utilization of the food label.   Exercise Physiology & Risk Factors: - Group verbal and written instruction with models to review the exercise physiology of the cardiovascular system and associated critical values. Details cardiovascular disease risk factors and the goals associated with each risk factor.   Aerobic Exercise & Resistance Training: - Gives group verbal and written discussion on the health impact of inactivity. On the components of aerobic and resistive training programs and the benefits of this training and how to safely progress through these programs.   Flexibility, Balance, General Exercise Guidelines: - Provides group verbal and written instruction on the benefits of flexibility and balance training programs. Provides general  exercise guidelines with specific guidelines to those with heart or lung disease. Demonstration and skill practice provided.   Stress Management: - Provides group verbal and written instruction about the health risks of elevated stress, cause of high stress, and healthy ways to reduce stress.   Depression: - Provides group verbal and written instruction on the correlation between heart/lung disease and depressed mood, treatment options, and the stigmas associated with seeking treatment.   Exercise & Equipment Safety: - Individual verbal instruction and demonstration of equipment use and safety with use of the equipment.          Pulmonary Rehab from 04/30/2016 in [facility] Cardiac and Pulmonary Rehab   Date  04/30/16   Educator  SB   Instruction Review Code  2- meets goals/outcomes      Infection Prevention: - Provides verbal and written material to individual with discussion of infection control including proper hand washing and proper equipment  cleaning during exercise session.      Pulmonary Rehab from 04/30/2016 in [facility] Cardiac and Pulmonary Rehab   Date  04/30/16   Educator  SB   Instruction Review Code  2- meets goals/outcomes      Falls Prevention: - Provides verbal and written material to individual with discussion of falls prevention and safety.      Pulmonary Rehab from 04/30/2016 in [facility] Cardiac and Pulmonary Rehab   Date  04/30/16   Educator  SB   Instruction Review Code  2- meets goals/outcomes      Diabetes: - Individual verbal and written instruction to review signs/symptoms of diabetes, desired ranges of glucose level fasting, after meals and with exercise. Advice that pre and post exercise glucose checks will be done for 3 sessions at entry of program.      Pulmonary Rehab from 04/30/2016 in [facility] Cardiac and Pulmonary Rehab   Date  04/30/16   Educator  SB   Instruction Review Code  2- meets goals/outcomes      Chronic Lung Diseases: - Group verbal and written  instruction to review new updates, new respiratory medications, new advancements in procedures and treatments. Provide informative websites and "800" numbers of self-education.   Lung Procedures: - Group verbal and written instruction to describe testing methods done to diagnose lung disease. Review the outcome of test results. Describe the treatment choices: Pulmonary Function Tests, ABGs and oximetry.   Energy Conservation: - Provide group verbal and written instruction for methods to conserve energy, plan and organize activities. Instruct on pacing techniques, use of adaptive equipment and posture/positioning to relieve shortness of breath.   Triggers: - Group verbal and written instruction to review types of environmental controls: home humidity, furnaces, filters, dust mite/pet prevention, HEPA vacuums. To discuss weather changes, air quality and the benefits of nasal washing.   Exacerbations: - Group verbal and written instruction to provide: warning signs, infection symptoms, calling MD promptly, preventive modes, and value of vaccinations. Review: effective airway clearance, coughing and/or vibration techniques. Create an Sports administrator.   Oxygen: - Individual and group verbal and written instruction on oxygen therapy. Includes supplement oxygen, available portable oxygen systems, continuous and intermittent flow rates, oxygen safety, concentrators, and Medicare reimbursement for oxygen.   Respiratory Medications: - Group verbal and written instruction to review medications for lung disease. Drug class, frequency, complications, importance of spacers, rinsing mouth after steroid MDI's, and proper cleaning methods for nebulizers.   AED/CPR: - Group verbal and written instruction with the use of models to demonstrate the basic use of the AED with the basic ABC's of resuscitation.   Breathing Retraining: - Provides individuals verbal and written instruction on purpose, frequency,  and proper technique of diaphragmatic breathing and pursed-lipped breathing. Applies individual practice skills.   Anatomy and Physiology of the Lungs: - Group verbal and written instruction with the use of models to provide basic lung anatomy and physiology related to function, structure and complications of lung disease.   Heart Failure: - Group verbal and written instruction on the basics of heart failure: signs/symptoms, treatments, explanation of ejection fraction, enlarged heart and cardiomyopathy.   Sleep Apnea: - Individual verbal and written instruction to review Obstructive Sleep Apnea. Review of risk factors, methods for diagnosing and types of masks and machines for OSA.   Anxiety: - Provides group, verbal and written instruction on the correlation between heart/lung disease and anxiety, treatment options, and management of anxiety.   Relaxation: - Provides group, verbal and written  instruction about the benefits of relaxation for patients with heart/lung disease. Also provides patients with examples of relaxation techniques.   Knowledge Questionnaire Score:     Knowledge Questionnaire Score - 04/30/16 1546    Knowledge Questionnaire Score   Pre Score 4/10       Core Components/Risk Factors/Patient Goals at Admission:     Personal Goals and Risk Factors at Admission - 04/30/16 1554    Core Components/Risk Factors/Patient Goals on Admission   Sedentary Yes   Intervention Provide advice, education, support and counseling about physical activity/exercise needs.;Develop an individualized exercise prescription for aerobic and resistive training based on initial evaluation findings, risk stratification, comorbidities and participant's personal goals.   Expected Outcomes Achievement of increased cardiorespiratory fitness and enhanced flexibility, muscular endurance and strength shown through measurements of functional capacity and personal statement of participant.    Improve shortness of breath with ADL's Yes  SOB increased with shopping.  Scores low for daily activities of living   Intervention Provide education, individualized exercise plan and daily activity instruction to help decrease symptoms of SOB with activities of daily living.   Expected Outcomes Short Term: Achieves a reduction of symptoms when performing activities of daily living.   Increase knowledge of respiratory medications and ability to use respiratory devices properly  --  Oxygen for activity.  Has Proventil as needed,      Core Components/Risk Factors/Patient Goals Review:    Core Components/Risk Factors/Patient Goals at Discharge (Final Review):    ITP Comments:     ITP Comments      04/30/16 1607           ITP Comments Med review completed  Intial ITP Continue with ITP          Comments:

## 2016-04-30 NOTE — Patient Instructions (Signed)
Patient Instructions  Patient Details  Name: Betty [name] MRN: [id number] Date of Birth: [birth year] Referring Provider:  Wynn Maudlin, MD  Below are the personal goals you chose as well as exercise and nutrition goals. Our goal is to help you keep on track towards obtaining and maintaining your goals. We will be discussing your progress on these goals with you throughout the program.  Initial Exercise Prescription:     Initial Exercise Prescription - 04/30/16 1400    Date of Initial Exercise RX and Referring Provider   Date 04/30/16   Oxygen   Oxygen Continuous   Liters 2   Treadmill   MPH 1.7   Grade 0   Minutes 15   Recumbant Bike   Level 2   RPM 40   Watts 20   Minutes 15   NuStep   Level 2   Watts 40   Minutes 15   Recumbant Elliptical   Level 2   RPM 40   Watts 20   Minutes 15   REL-XR   Level 2   Watts 50   Minutes 15   T5 Nustep   Level 2   Watts 40   Minutes 15   Biostep-RELP   Level 3   Watts 40   Minutes 15   Prescription Details   Frequency (times per week) 3   Duration Progress to 45 minutes of aerobic exercise without signs/symptoms of physical distress   Intensity   THRR REST +  30   Ratings of Perceived Exertion 11-15   Perceived Dyspnea 0-4   Progression   Progression Continue to progress workloads to maintain intensity without signs/symptoms of physical distress.   Resistance Training   Training Prescription Yes   Weight 2   Reps 10-15      Exercise Goals: Frequency: Be able to perform aerobic exercise three times per week working toward 3-5 days per week.  Intensity: Work with a perceived exertion of 11 (fairly light) - 15 (hard) as tolerated. Follow your new exercise prescription and watch for changes in prescription as you progress with the program. Changes will be reviewed with you when they are made.  Duration: You should be able to do 30 minutes of continuous aerobic exercise in addition to a 5 minute warm-up and a 5 minute  cool-down routine.  Nutrition Goals: Your personal nutrition goals will be established when you do your nutrition analysis with the dietician.  The following are nutrition guidelines to follow: Cholesterol < 200mg /day Sodium < 1500mg /day Fiber: Women over 50 yrs - 21 grams per day  Personal Goals:     Personal Goals and Risk Factors at Admission - 04/30/16 1554    Core Components/Risk Factors/Patient Goals on Admission   Sedentary Yes   Intervention Provide advice, education, support and counseling about physical activity/exercise needs.;Develop an individualized exercise prescription for aerobic and resistive training based on initial evaluation findings, risk stratification, comorbidities and participant's personal goals.   Expected Outcomes Achievement of increased cardiorespiratory fitness and enhanced flexibility, muscular endurance and strength shown through measurements of functional capacity and personal statement of participant.   Improve shortness of breath with ADL's Yes  SOB increased with shopping.  Scores low for daily activities of living   Intervention Provide education, individualized exercise plan and daily activity instruction to help decrease symptoms of SOB with activities of daily living.   Expected Outcomes Short Term: Achieves a reduction of symptoms when performing activities of daily living.   Increase knowledge  of respiratory medications and ability to use respiratory devices properly  --  Oxygen for activity.  Has Proventil as needed,      Tobacco Use Initial Evaluation: History  Smoking status  . Former Smoker -- 0.50 packs/day for 15 years  . Types: Cigarettes  Smokeless tobacco  . Never Used    Comment: Has a 20-pack-year history, qutting in 1970.     Copy of goals given to participant.

## 2016-05-08 DIAGNOSIS — J449 Chronic obstructive pulmonary disease, unspecified: Secondary | ICD-10-CM

## 2016-05-08 DIAGNOSIS — G4730 Sleep apnea, unspecified: Secondary | ICD-10-CM

## 2016-05-08 LAB — GLUCOSE, CAPILLARY
GLUCOSE-CAPILLARY: 182 mg/dL — AB (ref 65–99)
Glucose-Capillary: 164 mg/dL — ABNORMAL HIGH (ref 65–99)

## 2016-05-08 NOTE — Progress Notes (Signed)
Daily Session Note  Patient Details  Name: Betty [name] MRN: [id number] Date of Birth: June 30, 1946 Referring Provider:    Encounter Date: 05/08/2016  Check In:     Session Check In - 05/08/16 1240    Check-In   Location [facility]-Cardiac & Pulmonary Rehab   Staff Present Carson Myrtle, BS, RRT, Respiratory Therapist;Carroll Enterkin, RN, BSN;Rebecca Sickles, DPT, Burlene Arnt, BA, ACSM CEP, Exercise Physiologist   Supervising physician immediately available to respond to emergencies [facility] immediately available ER MD   Physician(s) Drs Joni Fears and Clearnce Hasten   Medication changes reported     No   Fall or balance concerns reported    No   Warm-up and Cool-down Performed on first and last piece of equipment   Resistance Training Performed Yes   VAD Patient? No   Pain Assessment   Currently in Pain? No/denies         Goals Met:  Proper associated with RPD/PD & O2 Sat Independence with exercise equipment Exercise tolerated well Strength training completed today  Goals Unmet:  Not Applicable  Comments: [name] is progressing well with exercise   Dr. Emily Filbert is Medical Director for [place] and [facility] Pulmonary Rehabilitation.

## 2016-05-10 ENCOUNTER — Encounter: Payer: Medicare Other | Admitting: Respiratory Therapy

## 2016-05-10 ENCOUNTER — Other Ambulatory Visit: Payer: Self-pay

## 2016-05-10 ENCOUNTER — Emergency Department: Payer: Medicare Other

## 2016-05-10 ENCOUNTER — Inpatient Hospital Stay
Admission: EM | Admit: 2016-05-10 | Discharge: 2016-05-12 | DRG: 308 | Disposition: A | Discharge status: Home / Self Care | Payer: Medicare Other | Attending: Internal Medicine | Admitting: Internal Medicine

## 2016-05-10 DIAGNOSIS — I5032 Chronic diastolic (congestive) heart failure: Secondary | ICD-10-CM

## 2016-05-10 DIAGNOSIS — I5033 Acute on chronic diastolic (congestive) heart failure: Secondary | ICD-10-CM | POA: Diagnosis present

## 2016-05-10 DIAGNOSIS — G4733 Obstructive sleep apnea (adult) (pediatric): Secondary | ICD-10-CM | POA: Diagnosis present

## 2016-05-10 DIAGNOSIS — Z90710 Acquired absence of both cervix and uterus: Secondary | ICD-10-CM

## 2016-05-10 DIAGNOSIS — R42 Dizziness and giddiness: Secondary | ICD-10-CM | POA: Diagnosis present

## 2016-05-10 DIAGNOSIS — Z9049 Acquired absence of other specified parts of digestive tract: Secondary | ICD-10-CM | POA: Diagnosis not present

## 2016-05-10 DIAGNOSIS — Z8249 Family history of ischemic heart disease and other diseases of the circulatory system: Secondary | ICD-10-CM

## 2016-05-10 DIAGNOSIS — E669 Obesity, unspecified: Secondary | ICD-10-CM | POA: Diagnosis present

## 2016-05-10 DIAGNOSIS — I4891 Unspecified atrial fibrillation: Secondary | ICD-10-CM

## 2016-05-10 DIAGNOSIS — J449 Chronic obstructive pulmonary disease, unspecified: Secondary | ICD-10-CM | POA: Diagnosis present

## 2016-05-10 DIAGNOSIS — E119 Type 2 diabetes mellitus without complications: Secondary | ICD-10-CM | POA: Diagnosis present

## 2016-05-10 DIAGNOSIS — Z79899 Other long term (current) drug therapy: Secondary | ICD-10-CM | POA: Diagnosis not present

## 2016-05-10 DIAGNOSIS — Z794 Long term (current) use of insulin: Secondary | ICD-10-CM

## 2016-05-10 DIAGNOSIS — Z8261 Family history of arthritis: Secondary | ICD-10-CM

## 2016-05-10 DIAGNOSIS — Z888 Allergy status to other drugs, medicaments and biological substances status: Secondary | ICD-10-CM | POA: Diagnosis not present

## 2016-05-10 DIAGNOSIS — Z86718 Personal history of other venous thrombosis and embolism: Secondary | ICD-10-CM | POA: Diagnosis not present

## 2016-05-10 DIAGNOSIS — Z87891 Personal history of nicotine dependence: Secondary | ICD-10-CM | POA: Diagnosis not present

## 2016-05-10 DIAGNOSIS — D649 Anemia, unspecified: Secondary | ICD-10-CM | POA: Diagnosis present

## 2016-05-10 DIAGNOSIS — I110 Hypertensive heart disease with heart failure: Secondary | ICD-10-CM | POA: Diagnosis present

## 2016-05-10 DIAGNOSIS — Z6830 Body mass index (BMI) 30.0-30.9, adult: Secondary | ICD-10-CM

## 2016-05-10 DIAGNOSIS — J849 Interstitial pulmonary disease, unspecified: Secondary | ICD-10-CM | POA: Diagnosis present

## 2016-05-10 DIAGNOSIS — Z823 Family history of stroke: Secondary | ICD-10-CM | POA: Diagnosis not present

## 2016-05-10 DIAGNOSIS — K219 Gastro-esophageal reflux disease without esophagitis: Secondary | ICD-10-CM | POA: Diagnosis present

## 2016-05-10 DIAGNOSIS — I776 Arteritis, unspecified: Secondary | ICD-10-CM | POA: Diagnosis present

## 2016-05-10 DIAGNOSIS — R002 Palpitations: Secondary | ICD-10-CM | POA: Diagnosis present

## 2016-05-10 DIAGNOSIS — Z800 Family history of malignant neoplasm of digestive organs: Secondary | ICD-10-CM | POA: Diagnosis not present

## 2016-05-10 DIAGNOSIS — R0600 Dyspnea, unspecified: Secondary | ICD-10-CM

## 2016-05-10 DIAGNOSIS — G4730 Sleep apnea, unspecified: Secondary | ICD-10-CM

## 2016-05-10 DIAGNOSIS — D72829 Elevated white blood cell count, unspecified: Secondary | ICD-10-CM

## 2016-05-10 LAB — BASIC METABOLIC PANEL WITH GFR
Anion gap: 12 (ref 5–15)
BUN: 24 mg/dL — ABNORMAL HIGH (ref 6–20)
CO2: 28 mmol/L (ref 22–32)
Calcium: 9.3 mg/dL (ref 8.9–10.3)
Chloride: 100 mmol/L — ABNORMAL LOW (ref 101–111)
Creatinine, Ser: 0.79 mg/dL (ref 0.44–1.00)
GFR calc Af Amer: >60 mL/min
GFR calc non Af Amer: >60 mL/min
Glucose, Bld: 168 mg/dL — ABNORMAL HIGH (ref 65–99)
Potassium: 3.9 mmol/L (ref 3.5–5.1)
Sodium: 140 mmol/L (ref 135–145)

## 2016-05-10 LAB — TROPONIN I
TROPONIN I: 0.04 ng/mL — AB (ref <0.031)
Troponin I: 0.03 ng/mL

## 2016-05-10 LAB — CBC
HCT: 34.1 % — ABNORMAL LOW (ref 35.0–47.0)
Hemoglobin: 10.6 g/dL — ABNORMAL LOW (ref 12.0–16.0)
MCH: 24.4 pg — ABNORMAL LOW (ref 26.0–34.0)
MCHC: 31.1 g/dL — ABNORMAL LOW (ref 32.0–36.0)
MCV: 78.4 fL — ABNORMAL LOW (ref 80.0–100.0)
Platelets: 310 K/uL (ref 150–440)
RBC: 4.35 MIL/uL (ref 3.80–5.20)
RDW: 21 % — ABNORMAL HIGH (ref 11.5–14.5)
WBC: 11.6 K/uL — ABNORMAL HIGH (ref 3.6–11.0)

## 2016-05-10 LAB — PROTIME-INR
INR: 0.93
Prothrombin Time: 12.7 s (ref 11.4–15.0)

## 2016-05-10 LAB — GLUCOSE, CAPILLARY: Glucose-Capillary: 178 mg/dL — ABNORMAL HIGH (ref 65–99)

## 2016-05-10 LAB — TSH: TSH: 1.132 u[IU]/mL (ref 0.350–4.500)

## 2016-05-10 MED ORDER — FUROSEMIDE 40 MG PO TABS
40.0000 mg | ORAL_TABLET | Freq: Every day | ORAL | Status: DC
  Administered 2016-05-11 – 2016-05-12 (×2): 40 mg via ORAL
  Filled 2016-05-10 (×2): qty 1

## 2016-05-10 MED ORDER — GABAPENTIN 300 MG PO CAPS
600.0000 mg | ORAL_CAPSULE | Freq: Two times a day (BID) | ORAL | Status: DC
  Administered 2016-05-10 – 2016-05-12 (×5): 600 mg via ORAL
  Filled 2016-05-10 (×5): qty 2

## 2016-05-10 MED ORDER — ALBUTEROL SULFATE (2.5 MG/3ML) 0.083% IN NEBU: 2.5000 mg | INHALATION_SOLUTION | RESPIRATORY_TRACT | Status: DC | PRN

## 2016-05-10 MED ORDER — METFORMIN HCL 500 MG PO TABS
1000.0000 mg | ORAL_TABLET | Freq: Three times a day (TID) | ORAL | Status: DC
  Administered 2016-05-10 – 2016-05-12 (×6): 1000 mg via ORAL
  Filled 2016-05-10 (×6): qty 2

## 2016-05-10 MED ORDER — SODIUM CHLORIDE 0.9% FLUSH
3.0000 mL | Freq: Two times a day (BID) | INTRAVENOUS | Status: DC
  Administered 2016-05-10 – 2016-05-12 (×4): 3 mL via INTRAVENOUS

## 2016-05-10 MED ORDER — SENNA 8.6 MG PO TABS
1.0000 | ORAL_TABLET | Freq: Two times a day (BID) | ORAL | Status: DC
  Administered 2016-05-10: 8.6 mg via ORAL
  Filled 2016-05-10 (×3): qty 1

## 2016-05-10 MED ORDER — OXYBUTYNIN CHLORIDE 5 MG PO TABS
5.0000 mg | ORAL_TABLET | Freq: Two times a day (BID) | ORAL | Status: DC
  Administered 2016-05-10 – 2016-05-12 (×4): 5 mg via ORAL
  Filled 2016-05-10 (×5): qty 1

## 2016-05-10 MED ORDER — LOSARTAN POTASSIUM 25 MG PO TABS
50.0000 mg | ORAL_TABLET | Freq: Every day | ORAL | Status: DC
  Administered 2016-05-11: 50 mg via ORAL
  Filled 2016-05-10: qty 2

## 2016-05-10 MED ORDER — HYDROCODONE-ACETAMINOPHEN 5-325 MG PO TABS
1.0000 | ORAL_TABLET | Freq: Three times a day (TID) | ORAL | Status: DC | PRN
  Administered 2016-05-11 – 2016-05-12 (×2): 1 via ORAL
  Filled 2016-05-10 (×2): qty 1

## 2016-05-10 MED ORDER — ROPINIROLE HCL 1 MG PO TABS
3.0000 mg | ORAL_TABLET | Freq: Every day | ORAL | Status: DC
  Administered 2016-05-10 – 2016-05-11 (×2): 3 mg via ORAL
  Filled 2016-05-10 (×2): qty 3

## 2016-05-10 MED ORDER — INSULIN DETEMIR 100 UNIT/ML ~~LOC~~ SOLN
6.0000 [IU] | Freq: Every day | SUBCUTANEOUS | Status: DC
  Administered 2016-05-11 – 2016-05-12 (×2): 6 [IU] via SUBCUTANEOUS
  Filled 2016-05-10 (×2): qty 0.06

## 2016-05-10 MED ORDER — PANTOPRAZOLE SODIUM 40 MG PO TBEC
40.0000 mg | DELAYED_RELEASE_TABLET | Freq: Every day | ORAL | Status: DC
  Administered 2016-05-11 – 2016-05-12 (×2): 40 mg via ORAL
  Filled 2016-05-10 (×2): qty 1

## 2016-05-10 MED ORDER — BIOTIN 5000 MCG PO CAPS: 5000.0000 ug | ORAL_CAPSULE | Freq: Every day | ORAL | Status: DC

## 2016-05-10 MED ORDER — SODIUM CHLORIDE 0.9% FLUSH: 3.0000 mL | INTRAVENOUS | Status: DC | PRN

## 2016-05-10 MED ORDER — TRAZODONE HCL 100 MG PO TABS
200.0000 mg | ORAL_TABLET | Freq: Every day | ORAL | Status: DC
  Administered 2016-05-10 – 2016-05-11 (×2): 200 mg via ORAL
  Filled 2016-05-10 (×2): qty 2

## 2016-05-10 MED ORDER — CALCIUM CARBONATE-VITAMIN D 500-200 MG-UNIT PO TABS
1.0000 | ORAL_TABLET | Freq: Every day | ORAL | Status: DC
  Administered 2016-05-11 – 2016-05-12 (×2): 1 via ORAL
  Filled 2016-05-10 (×2): qty 1

## 2016-05-10 MED ORDER — ONDANSETRON HCL 4 MG PO TABS: 4.0000 mg | ORAL_TABLET | Freq: Four times a day (QID) | ORAL | Status: DC | PRN

## 2016-05-10 MED ORDER — ACETAMINOPHEN 650 MG RE SUPP: 650.0000 mg | Freq: Four times a day (QID) | RECTAL | Status: DC | PRN

## 2016-05-10 MED ORDER — AZATHIOPRINE 50 MG PO TABS
100.0000 mg | ORAL_TABLET | Freq: Every day | ORAL | Status: DC
  Administered 2016-05-11 – 2016-05-12 (×2): 100 mg via ORAL
  Filled 2016-05-10 (×4): qty 2

## 2016-05-10 MED ORDER — ACETAMINOPHEN 325 MG PO TABS: 650.0000 mg | ORAL_TABLET | Freq: Four times a day (QID) | ORAL | Status: DC | PRN

## 2016-05-10 MED ORDER — TRIAMTERENE-HCTZ 37.5-25 MG PO TABS
1.0000 | ORAL_TABLET | Freq: Every day | ORAL | Status: DC
  Administered 2016-05-11 – 2016-05-12 (×2): 1 via ORAL
  Filled 2016-05-10 (×2): qty 1

## 2016-05-10 MED ORDER — DILTIAZEM HCL 25 MG/5ML IV SOLN
10.0000 mg | Freq: Once | INTRAVENOUS | Status: AC
  Administered 2016-05-10: 10 mg via INTRAVENOUS
  Filled 2016-05-10: qty 5

## 2016-05-10 MED ORDER — SODIUM CHLORIDE 0.9 % IV SOLN: 250.0000 mL | INTRAVENOUS | Status: DC | PRN

## 2016-05-10 MED ORDER — DOCUSATE SODIUM 100 MG PO CAPS
100.0000 mg | ORAL_CAPSULE | Freq: Two times a day (BID) | ORAL | Status: DC
Start: 2016-05-10 — End: 2016-05-12
  Administered 2016-05-10 – 2016-05-11 (×2): 100 mg via ORAL
  Filled 2016-05-10 (×3): qty 1

## 2016-05-10 MED ORDER — PREDNISONE 20 MG PO TABS
30.0000 mg | ORAL_TABLET | Freq: Every day | ORAL | Status: DC
  Administered 2016-05-11 – 2016-05-12 (×2): 30 mg via ORAL
  Filled 2016-05-10 (×2): qty 1

## 2016-05-10 MED ORDER — DILTIAZEM HCL ER COATED BEADS 240 MG PO CP24
240.0000 mg | ORAL_CAPSULE | Freq: Every day | ORAL | Status: DC
  Administered 2016-05-11 – 2016-05-12 (×2): 240 mg via ORAL
  Filled 2016-05-10 (×2): qty 1

## 2016-05-10 MED ORDER — ONDANSETRON HCL 4 MG/2ML IJ SOLN: 4.0000 mg | Freq: Four times a day (QID) | INTRAMUSCULAR | Status: DC | PRN

## 2016-05-10 MED ORDER — VENLAFAXINE HCL ER 75 MG PO CP24
225.0000 mg | ORAL_CAPSULE | Freq: Every day | ORAL | Status: DC
  Administered 2016-05-11 – 2016-05-12 (×2): 225 mg via ORAL
  Filled 2016-05-10 (×4): qty 3

## 2016-05-10 MED ORDER — DILTIAZEM HCL 30 MG PO TABS
30.0000 mg | ORAL_TABLET | Freq: Four times a day (QID) | ORAL | Status: DC
  Administered 2016-05-10 – 2016-05-11 (×3): 30 mg via ORAL
  Filled 2016-05-10 (×3): qty 1

## 2016-05-10 NOTE — H&P (Addendum)
[facility] [place] NAME: Betty [name]    MR#:  [id number]  DATE OF BIRTH:  01-Sep-1946  DATE OF ADMISSION:  05/10/2016  PRIMARY CARE PHYSICIAN: Dion Body, MD   REQUESTING/REFERRING PHYSICIAN:   CHIEF COMPLAINT:   Chief Complaint  Patient presents with  . Dizziness    HISTORY OF PRESENT ILLNESS: Betty [name]  is a 70 y.o. 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 Apparently patient was hospitalized at to minimize the [facility] from the 10th to 20th of April 2017 where she was diagnosed with and associated vasculitis. 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 Patient was discharged and now she is undergoing pulmonary rehabilitation. She was at pulmonary rehabilitation Center, her vitals were checked and she was noted to have heart rate elevated to 140. She was sent to emergency room for further evaluation and therapy. She admits of feeling fatigue and tired for the past 3-4 days, feeling short of breath especially on exertion. She does have palpitations. Denies any presyncopal symptoms.   PAST MEDICAL HISTORY:   Past Medical History  Diagnosis Date  . Headache(784.0)   . Backache, unspecified   . Herpes zoster without mention of complication   . Osteoarthrosis, unspecified whether generalized or localized, unspecified site   . Nontoxic uninodular goiter   . Obesity, unspecified   . Esophageal reflux   . Unspecified sleep apnea   . Atrial fibrillation ([place])   . Acute diastolic heart failure ([place])   . Cardiomegaly   . Hypertension     heart controlled w CHF  . Asthma   . Diabetes mellitus without complication ([place])    . Allergy   . Hx: UTI (urinary tract infection)   . Urine incontinence     hx of  . 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 COPD (chronic obstructive pulmonary disease) ([place])     PAST SURGICAL HISTORY: Past Surgical History  Procedure Laterality Date  . Hysterectomy w unilateral    . Oophorectomy    . Cholecystectomy    . Abdominal hysterectomy  1979    complete (for precancerous cells)  . Ablation  2011 & 2014    SOCIAL HISTORY:  Social History  Substance Use Topics  . Smoking status: Former Smoker -- 0.50 packs/day for 15 years    Types: Cigarettes  . Smokeless tobacco: Never Used     Comment: Has a 20-pack-year history, qutting in 1970.   Marland Kitchen Alcohol Use: No    FAMILY HISTORY:  Family History  Problem Relation Age of Onset  . Heart attack Father   . Heart failure Father   . Arthritis Father   . Stroke Father   . Hypertension Father   . Coronary artery disease Brother   . Peripheral vascular disease Brother   . Arthritis Mother   . Colon cancer Mother     colon cancer  . Hypertension Mother   . Cancer Maternal Grandmother     colon cancer  . Arthritis Maternal Grandmother     DRUG ALLERGIES:  Allergies  Allergen Reactions  . Amiodarone Other (See Comments)    Pt states that this medication  causes lung bleeding.      Review of Systems  Constitutional: Positive for malaise/fatigue. Negative for fever, chills and weight loss.  HENT: Negative for congestion.   Eyes: Negative for blurred vision and double vision.  Respiratory: Positive for shortness of breath. Negative for cough, sputum production and wheezing.   Cardiovascular: Positive for palpitations. Negative for chest pain, orthopnea, leg swelling and PND.  Gastrointestinal: Negative for nausea, vomiting, abdominal pain, diarrhea, constipation, blood in stool and melena.  Genitourinary: Negative for dysuria, urgency, frequency and  hematuria.  Musculoskeletal: Negative for falls.  Skin: Negative for rash.  Neurological: Positive for weakness. Negative for dizziness, tremors, focal weakness and headaches.  Endo/Heme/Allergies: Does not bruise/bleed easily.  Psychiatric/Behavioral: Negative for depression and memory loss. The patient is not nervous/anxious and does not have insomnia.     MEDICATIONS AT HOME:  Prior to Admission medications   Medication Sig Start Date End Date Taking? Authorizing Provider  albuterol (PROVENTIL HFA;VENTOLIN HFA) 108 (90 Base) MCG/ACT inhaler Inhale 2 puffs into the lungs every 4 (four) hours as needed for wheezing or shortness of breath. 01/17/16  Yes Flora Lipps, MD  azaTHIOprine (IMURAN) 50 MG tablet Take 100 mg by mouth daily.    Yes Historical Provider, MD  Biotin 5000 MCG CAPS Take 5,000 mcg by mouth daily.    Yes Historical Provider, MD  calcium-vitamin D (OSCAL WITH D) 500-200 MG-UNIT per tablet Take 1 tablet by mouth daily.   Yes Historical Provider, MD  diltiazem (CARDIZEM CD) 240 MG 24 hr capsule Take 240 mg by mouth daily.   Yes Historical Provider, MD  furosemide (LASIX) 40 MG tablet Take 40 mg by mouth daily.   Yes Historical Provider, MD  gabapentin (NEURONTIN) 600 MG tablet Take 600 mg by mouth 2 (two) times daily.   Yes Historical Provider, MD  HYDROcodone-acetaminophen (NORCO/VICODIN) 5-325 MG tablet Take 1 tablet by mouth every 8 (eight) hours as needed for moderate pain.   Yes Historical Provider, MD  insulin NPH Human (HUMULIN N,NOVOLIN N) 100 UNIT/ML injection Inject 6 Units into the skin daily before breakfast.  04/11/16 04/11/17 Yes Historical Provider, MD  losartan (COZAAR) 50 MG tablet Take 50 mg by mouth daily.   Yes Historical Provider, MD  metFORMIN (GLUCOPHAGE) 1000 MG tablet Take 1,000 mg by mouth 3 (three) times daily.    Yes Historical Provider, MD  oxybutynin (DITROPAN) 5 MG tablet Take 5 mg by mouth 2 (two) times daily.   Yes Historical Provider, MD   pantoprazole (PROTONIX) 40 MG tablet Take 40 mg by mouth daily.   Yes Historical Provider, MD  predniSONE (DELTASONE) 10 MG tablet Take 30 mg by mouth daily with breakfast.  04/24/16  Yes Historical Provider, MD  rOPINIRole (REQUIP) 3 MG tablet Take 3 mg by mouth at bedtime.   Yes Historical Provider, MD  traZODone (DESYREL) 100 MG tablet Take 200 mg by mouth at bedtime.   Yes Historical Provider, MD  triamterene-hydrochlorothiazide (MAXZIDE-25) 37.5-25 MG tablet Take 1 tablet by mouth daily.   Yes Historical Provider, MD  Venlafaxine HCl 225 MG TB24 Take 225 mg by mouth daily.   Yes Historical Provider, MD  warfarin (COUMADIN) 1 MG tablet Take 1-2 mg by mouth at bedtime. Reported on 04/30/2016    Historical Provider, MD  warfarin (COUMADIN) 6 MG tablet Take 6 mg by mouth at bedtime. Reported on 04/30/2016    Historical Provider, MD      PHYSICAL EXAMINATION:   VITAL SIGNS: Blood pressure 103/70,  pulse 155, temperature 98.7 F (37.1 C), temperature source Oral, resp. rate 19, height 5\' 9"  (1.753 m), weight 107.956 kg (238 lb), SpO2 95 %.  GENERAL:  70 y.o.-year-old patient lying in the bed In mild respiratory distress, tachypneic, diaphoretic.  EYES: Pupils equal, round, reactive to light and accommodation. No scleral icterus. Extraocular muscles intact.  HEENT: Head atraumatic, normocephalic. Oropharynx and nasopharynx clear.  NECK:  Supple, no jugular venous distention. No thyroid enlargement, no tenderness.  LUNGS: Diminished breath sounds bilaterally, no wheezing, rales,rhonchi or crepitation. Intermittent use of accessory muscles of respiration, especially on exertion, talking  CARDIOVASCULAR: S1, S2 , irregularly irregular, tachycardic. No obvious murmurs, rubs, or gallops, although very difficult to evaluate due to significant tachycardia.  ABDOMEN: Soft, nontender, nondistended. Bowel sounds present. No organomegaly or mass.  EXTREMITIES: No pedal edema, cyanosis, or clubbing.   NEUROLOGIC: Cranial nerves II through XII are intact. Muscle strength 5/5 in all extremities. Sensation intact. Gait not checked.  PSYCHIATRIC: The patient is alert and oriented x 3.  SKIN: No obvious rash, lesion, or ulcer.   LABORATORY PANEL:   CBC  Recent Labs Lab 05/10/16 1339  WBC 11.6*  HGB 10.6*  HCT 34.1*  PLT 310  MCV 78.4*  MCH 24.4*  MCHC 31.1*  RDW 21.0*   ------------------------------------------------------------------------------------------------------------------  Chemistries   Recent Labs Lab 05/10/16 1339  NA 140  K 3.9  CL 100*  CO2 28  GLUCOSE 168*  BUN 24*  CREATININE 0.79  CALCIUM 9.3   ------------------------------------------------------------------------------------------------------------------  Cardiac Enzymes  Recent Labs Lab 05/10/16 1339  TROPONINI 0.03   ------------------------------------------------------------------------------------------------------------------  RADIOLOGY: Dg Chest Port 1 View  05/10/2016  CLINICAL DATA:  Pt states that she has been feeling weak and dizzy for the past few days, pt states that she has been diaphoretic and dragging with everything she has attempted to do, pt states she came here for pulmonary rehab. EXAM: PORTABLE CHEST 1 VIEW COMPARISON:  CT chest 04/02/2016 FINDINGS: Mild bilateral interstitial thickening. There is no focal parenchymal opacity. There is no pleural effusion or pneumothorax. There is stable cardiomegaly. The osseous structures are unremarkable. IMPRESSION: Findings most concerning for mild CHF. Electronically Signed   By: Kathreen Devoid   On: 05/10/2016 14:31    EKG: Orders placed or performed during the hospital encounter of 05/10/16  . ED EKG  . ED EKG   EKG revealed A. fib of rate of 141 bpm, normal axis, nonspecific ST-T changes IMPRESSION AND PLAN:  Principal Problem:   A-fib (HCC) Active Problems:   Dyspnea   Acute diastolic CHF (congestive heart failure)  (HCC)   Anemia   Anemia #1 atrial fibrillation, rapid ventricular response, a diminished medical floor initiating her on amiodarone per Dr. Etta Quill recommendation to control heart rate, no heparin infusion or Coumadin therapy at present due to history of recent diffuse alveolar hemorrhage, get cardiac enzymes, sensory, get cardiology consultation , echocardiogram was done in January 2017 revealing normal ejection fraction of 60-65%, mild LVH, will not repeat echo. Patient is not considered to be a good candidate for Midrin therapy due to interstitial lung disease. I discussed with Dr. Clayborn Bigness patient's case and therapy, recommended to initiate another on, to be continued not longer than 1-2 months.  #2. Dyspnea, due to A. fib, RVR, mild CHF, continue oxygen therapy as needed #3. Acute diastolic CHF, initiate patient on diuretics. If blood pressure. It allows #4 anemia, get Hemoccult, no anticoagulation due to concerns of diffuse alveolar hemorrhage.   All  the records are reviewed and case discussed with ED provider. Management plans discussed with the patient, family and they are in agreement.  CODE STATUS: Code Status History    Date Active Date Inactive Code Status Order ID Comments User Context   04/01/2016  5:13 PM 04/03/2016  5:58 AM Full Code [id number]  Henreitta Leber, MD Inpatient    Advance Directive Documentation        Most Recent Value   Type of Advance Directive  Healthcare Power of Attorney   Pre-existing out of facility DNR order (yellow form or pink MOST form)     "MOST" Form in Place?         TOTAL TIME TAKING CARE OF THIS PATIENT: 50 minutes.    Theodoro Grist M.D on 05/10/2016 at 3:47 PM  Between 7am to 6pm - Pager - [phone] After 6pm go to [email or web address] - password [email or web address] [facility]  Office  [phone]  CC: Primary care physician; Dion Body, MD

## 2016-05-10 NOTE — Progress Notes (Signed)
Per MD Ether Griffins order cardizem short acting for HR

## 2016-05-10 NOTE — ED Notes (Signed)
Pt states that she has been feeling weak and dizzy for the past few days, pt states that she has been diaphoretic and dragging with everything she has attempted to do, pt states she came here for pulmonary rehab and they found her heart rate to be elevated and irreg

## 2016-05-10 NOTE — ED Provider Notes (Signed)
[facility] Emergency Department Provider Note        Time seen: ----------------------------------------- 1:39 PM on 05/10/2016 -----------------------------------------    I have reviewed the triage vital signs and the nursing notes.   HISTORY  Chief Complaint Dizziness    HPI Betty [name] is a 70 y.o. female who presents ER for feeling weak and dizzy for the past few days. Patient states she's been having diaphoresis and feels weak with a ring she attempts to do. Patient states she came here for pulmonary rehabilitation and he found her heart rate was fast and irregular. She does have a history of atrial fibrillation but had an ablation in 2010 and since then has not had atrial fibrillation. She did not take anticoagulants because of issues with vasculitis.   Past Medical History  Diagnosis Date  . Headache(784.0)   . Backache, unspecified   . Herpes zoster without mention of complication   . Osteoarthrosis, unspecified whether generalized or localized, unspecified site   . Nontoxic uninodular goiter   . Obesity, unspecified   . Esophageal reflux   . Unspecified sleep apnea   . Atrial fibrillation ([place])   . Acute diastolic heart failure ([place])   . Cardiomegaly   . Hypertension     heart controlled w CHF  . Asthma   . Diabetes mellitus without complication ([place])   . Allergy   . Hx: UTI (urinary tract infection)   . Urine incontinence     hx of  . 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 COPD (chronic obstructive pulmonary disease) [facility])     Patient Active Problem List   Diagnosis Date Noted  . Other specified abnormal immunological findings in serum 04/24/2016  . Sepsis ([place]) 04/01/2016  . Prolonged Q-T interval on ECG 03/13/2016  . Mitral stenosis 03/13/2016  . Essential hypertension, benign 03/13/2016  . Diastolic congestive heart failure ([place]) 03/13/2016  .  Interstitial lung disease ([place]) 12/29/2015  . Chronic LBP 07/26/2015  . Essential (primary) hypertension 07/26/2015  . Idiopathic insomnia 07/26/2015  . Restless leg 07/26/2015  . Obesity (BMI 30-39.9) 01/07/2015  . Abnormal EKG 09/11/2014  . External nasal lesion 02/26/2014  . Abnormal liver function tests 01/28/2014  . Ankle pain, right 01/28/2014  . Arthralgia of ankle or foot 01/28/2014  . Urinary frequency 12/24/2013  . Hemorrhoids 12/24/2013  . Neck pain on right side 11/10/2013  . Vitamin D deficiency 09/09/2013  . Obstructive sleep apnea 05/30/2013  . Urinary incontinence 05/30/2013  . Diabetes ([place]) 05/30/2013  . History of colonic polyps 05/30/2013  . Controlled type 2 diabetes mellitus without complication ([place]) [id number]  . H/O deep venous thrombosis 05/05/2013  . Depression 04/13/2009  . THYROID NODULE 04/13/2009  . 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) 04/13/2009  . GERD 04/13/2009  . OSTEOARTHRITIS 04/13/2009  . BACK PAIN 04/13/2009  . HEADACHE 04/13/2009  . Gastro-esophageal reflux disease without esophagitis 04/13/2009  . Mild episode of recurrent major depressive disorder ([place]) 04/13/2009  . 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 ATRIAL FIBRILLATION 03/28/2009  . DIASTOLIC HEART FAILURE, ACUTE 03/28/2009  . VENTRICULAR HYPERTROPHY, LEFT 03/28/2009    Past Surgical History  Procedure Laterality Date  . Hysterectomy w unilateral    . Oophorectomy    . Cholecystectomy    . Abdominal hysterectomy  1979    complete (for precancerous cells)  . Ablation  2011 & 2014  Allergies Amiodarone  Social History Social History  Substance Use Topics  . Smoking status: Former Smoker -- 0.50 packs/day for 15 years    Types: Cigarettes  . Smokeless tobacco: Never Used     Comment: Has a 20-pack-year history, qutting in 1970.   Marland Kitchen Alcohol Use: No   Review of Systems Constitutional: Negative for fever. Eyes: Negative for visual  changes. ENT: Negative for sore throat. Cardiovascular: Negative for chest pain.Positive for irregular heartbeat Respiratory: Negative for shortness of breath. Gastrointestinal: Negative for abdominal pain, vomiting and diarrhea. Genitourinary: Negative for dysuria. Musculoskeletal: Negative for back pain. Skin: Negative for rash. Neurological: Negative for headaches, Positive for dizziness  10-point ROS otherwise negative.  ____________________________________________   PHYSICAL EXAM:  VITAL SIGNS: ED Triage Vitals  Enc Vitals Group     BP 05/10/16 1311 134/64 mmHg     Pulse Rate 05/10/16 1311 141     Resp 05/10/16 1311 20     Temp 05/10/16 1311 98.7 F (37.1 C)     Temp Source 05/10/16 1311 Oral     SpO2 05/10/16 1311 93 %     Weight 05/10/16 1311 238 lb (107.956 kg)     Height 05/10/16 1311 5\' 9"  (1.753 m)     Head Cir --      Peak Flow --      Pain Score 05/10/16 1311 6     Pain Loc --      Pain Edu? --      Excl. in [place]? --     Constitutional: Alert and oriented. Mild distress Eyes: Conjunctivae are normal. PERRL. Normal extraocular movements. ENT   Head: Normocephalic and atraumatic.   Nose: No congestion/rhinnorhea.   Mouth/Throat: Mucous membranes are moist.   Neck: No stridor. Cardiovascular: Irregularly, irregular rhythm. No murmurs, rubs, or gallops. Respiratory: Normal respiratory effort without tachypnea nor retractions. Breath sounds are clear and equal bilaterally. No wheezes/rales/rhonchi. Gastrointestinal: Soft and nontender. Normal bowel sounds Musculoskeletal: Nontender with normal range of motion in all extremities. No lower extremity tenderness nor edema. Neurologic:  Normal speech and language. No gross focal neurologic deficits are appreciated.  Skin:  Skin is warm, dry and intact. No rash noted. Psychiatric: Mood and affect are normal. Speech and behavior are normal.  ____________________________________________  EKG: Interpreted  by me.Atrial fibrillation with rapid ventricular response, rate is 141 bpm, normal QRS, normal QT interval. Nonspecific ST-T wave changes. Normal axis.  ____________________________________________  ED COURSE:  Pertinent labs & imaging results that were available during my care of the patient were reviewed by me and considered in my medical decision making (see chart for details). Patient presents to ER with dizziness found to be in rapid A. fib. Patient will be given IV Cardizem ____________________________________________    LABS (pertinent positives/negatives)  Labs Reviewed  BASIC METABOLIC PANEL - Abnormal; Notable for the following:    Chloride 100 (*)    Glucose, Bld 168 (*)    BUN 24 (*)    All other components within normal limits  CBC - Abnormal; Notable for the following:    WBC 11.6 (*)    Hemoglobin 10.6 (*)    HCT 34.1 (*)    MCV 78.4 (*)    MCH 24.4 (*)    MCHC 31.1 (*)    RDW 21.0 (*)    All other components within normal limits  TROPONIN I  PROTIME-INR    RADIOLOGY  Chest x-ray  IMPRESSION: Findings most concerning for mild CHF.  ____________________________________________  FINAL ASSESSMENT AND PLAN  Dizziness, atrial fibrillation  Plan: Patient with labs and imaging as dictated above. Patient resents to ER with atrial fibrillation which she had ablation for back in 2010. According to her she thought her atrial fibrillation had resolved. Over the past several days she's felt ill, will recommend observation. She remains in a 2 fibrillation despite Cardizem here.   Earleen Newport, MD   Note: This dictation was prepared with Dragon dictation. Any transcriptional errors that result from this process are unintentional   Earleen Newport, MD 05/10/16 [phone]

## 2016-05-10 NOTE — Progress Notes (Signed)

## 2016-05-10 NOTE — Progress Notes (Signed)
Incomplete Session Note  Patient Details  Name: Betty [name] MRN: [id number] Date of Birth: 10-21-1946 Referring Provider:    [name] did not complete her rehab session today.  She stayed for education today about AED and CPR.  She was sweating and short of breath.  Her EKG showed Atrial fib with RVR of 141.  Dr. Nehemiah Massed suggested that we take her to the ER.  We pushed her to the ER in a wheelchair and helped her with the admitting process.

## 2016-05-11 LAB — CBC
HEMATOCRIT: 32.6 % — AB (ref 35.0–47.0)
Hemoglobin: 10.4 g/dL — ABNORMAL LOW (ref 12.0–16.0)
MCH: 24.7 pg — AB (ref 26.0–34.0)
MCHC: 31.7 g/dL — ABNORMAL LOW (ref 32.0–36.0)
MCV: 77.7 fL — AB (ref 80.0–100.0)
PLATELETS: 288 10*3/uL (ref 150–440)
RBC: 4.2 MIL/uL (ref 3.80–5.20)
RDW: 20.6 % — ABNORMAL HIGH (ref 11.5–14.5)
WBC: 8.6 10*3/uL (ref 3.6–11.0)

## 2016-05-11 LAB — BASIC METABOLIC PANEL
Anion gap: 8 (ref 5–15)
BUN: 25 mg/dL — AB (ref 6–20)
CHLORIDE: 102 mmol/L (ref 101–111)
CO2: 30 mmol/L (ref 22–32)
CREATININE: 0.6 mg/dL (ref 0.44–1.00)
Calcium: 8.6 mg/dL — ABNORMAL LOW (ref 8.9–10.3)
GFR calc Af Amer: >60 mL/min (ref >60)
GFR calc non Af Amer: >60 mL/min (ref >60)
Glucose, Bld: 107 mg/dL — ABNORMAL HIGH (ref 65–99)
POTASSIUM: 3.4 mmol/L — AB (ref 3.5–5.1)
Sodium: 140 mmol/L (ref 135–145)

## 2016-05-11 LAB — GLUCOSE, CAPILLARY: Glucose-Capillary: 125 mg/dL — ABNORMAL HIGH (ref 65–99)

## 2016-05-11 LAB — HEMOGLOBIN A1C: Hgb A1c MFr Bld: 6.6 % — ABNORMAL HIGH (ref 4.0–6.0)

## 2016-05-11 LAB — TROPONIN I: Troponin I: 0.03 ng/mL (ref <0.031)

## 2016-05-11 MED ORDER — METOPROLOL TARTRATE 25 MG PO TABS
25.0000 mg | ORAL_TABLET | Freq: Four times a day (QID) | ORAL | Status: DC
  Administered 2016-05-11 – 2016-05-12 (×4): 25 mg via ORAL
  Filled 2016-05-11 (×4): qty 1

## 2016-05-11 MED ORDER — METOPROLOL TARTRATE 5 MG/5ML IV SOLN
5.0000 mg | Freq: Once | INTRAVENOUS | Status: AC
  Administered 2016-05-11: 5 mg via INTRAVENOUS
  Filled 2016-05-11: qty 5

## 2016-05-11 NOTE — Consult Note (Signed)
Reason for Consult: Atrial fibrillation Referring Physician: Dr. Ether Griffins, primary physician is Dr. Janese Banks Betty [name] is an 70 y.o. female.  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 Patient has been feeling weak and tired and fatigued when to pulmonary rehabilitation and was found on initial assessment to have an elevated heart rate of 140 and irregular EKG suggests atrial fibrillation so the patient was referred to emergency room for evaluation. Patient has a history of atrial fibrillation in the past but had ablation in 2010 at [facility]. Patient had been maintained on anticoagulation up until her recent hospitalization for vasculitis where was discontinued because of hemoptysis. Patient denies any chest pain has minimal shortness of breath no blackout spells or syncope. Patient is allergic to amiodarone will consider sotalol but we'll try to treat with rate control only for now until seen by EP.  Past Medical History  Diagnosis Date  . Headache(784.0)   . Backache, unspecified   . Herpes zoster without mention of complication   . Osteoarthrosis, unspecified whether generalized or localized, unspecified site   . Nontoxic uninodular goiter   . Obesity, unspecified   . Esophageal reflux   . Unspecified sleep apnea   . Atrial fibrillation ([place])   . Acute diastolic heart failure ([place])   . Cardiomegaly   . Hypertension     heart controlled w CHF  . Asthma   . Diabetes mellitus without complication ([place])   . Allergy   . Hx: UTI (urinary tract infection)   . Urine incontinence     hx of  . 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 COPD (chronic obstructive pulmonary disease) [facility])     Past Surgical History  Procedure Laterality Date  . Hysterectomy w unilateral    . Oophorectomy    . Cholecystectomy    . Abdominal hysterectomy  1979     complete (for precancerous cells)  . Ablation  2011 & 2014    Family History  Problem Relation Age of Onset  . Heart attack Father   . Heart failure Father   . Arthritis Father   . Stroke Father   . Hypertension Father   . Coronary artery disease Brother   . Peripheral vascular disease Brother   . Arthritis Mother   . Colon cancer Mother     colon cancer  . Hypertension Mother   . Cancer Maternal Grandmother     colon cancer  . Arthritis Maternal Grandmother     Social History:  reports that she has quit smoking. Her smoking use included Cigarettes. She has a 7.5 pack-year smoking history. She has never used smokeless tobacco. She reports that she does not drink alcohol or use illicit drugs.  Allergies:  Allergies  Allergen Reactions  . Amiodarone Other (See Comments)    Pt states that this medication causes lung bleeding.      Medications: I have reviewed the patient's current medications.  Results for orders placed or performed during the hospital encounter of 05/10/16 (from the past 48 hour(s))  Basic metabolic panel     Status: Abnormal   Collection Time: 05/10/16  1:39 PM  Result Value Ref Range   Sodium 140 135 - 145 mmol/L   Potassium 3.9 3.5 - 5.1 mmol/L   Chloride 100 (L) 101 - 111 mmol/L   CO2 28 22 - 32 mmol/L  Glucose, Bld 168 (H) 65 - 99 mg/dL   BUN 24 (H) 6 - 20 mg/dL   Creatinine, Ser 0.79 0.44 - 1.00 mg/dL   Calcium 9.3 8.9 - 10.3 mg/dL   GFR calc non Af Amer >60 >60 mL/min   GFR calc Af Amer >60 >60 mL/min    Comment: (NOTE) The eGFR has been calculated using the CKD EPI equation. This calculation has not been validated in all clinical situations. eGFR's persistently <60 mL/min signify possible Chronic Kidney Disease.    Anion gap 12 5 - 15  CBC     Status: Abnormal   Collection Time: 05/10/16  1:39 PM  Result Value Ref Range   WBC 11.6 (H) 3.6 - 11.0 K/uL   RBC 4.35 3.80 - 5.20 MIL/uL   Hemoglobin 10.6 (L) 12.0 - 16.0 g/dL   HCT 34.1 (L)  35.0 - 47.0 %   MCV 78.4 (L) 80.0 - 100.0 fL   MCH 24.4 (L) 26.0 - 34.0 pg   MCHC 31.1 (L) 32.0 - 36.0 g/dL   RDW 21.0 (H) 11.5 - 14.5 %   Platelets 310 150 - 440 K/uL  Troponin I     Status: None   Collection Time: 05/10/16  1:39 PM  Result Value Ref Range   Troponin I 0.03 <0.031 ng/mL    Comment:        NO INDICATION OF MYOCARDIAL INJURY.   Protime-INR     Status: None   Collection Time: 05/10/16  1:39 PM  Result Value Ref Range   Prothrombin Time 12.7 11.4 - 15.0 seconds   INR 0.93   TSH     Status: None   Collection Time: 05/10/16  4:59 PM  Result Value Ref Range   TSH 1.132 0.350 - 4.500 uIU/mL  Troponin I     Status: Abnormal   Collection Time: 05/10/16  4:59 PM  Result Value Ref Range   Troponin I 0.04 (H) <0.031 ng/mL    Comment: READ BACK AND VERIFIED WITH CHERYL GREEN AT 1803 05/10/2016 BY TFK        PERSISTENTLY INCREASED TROPONIN VALUES IN THE RANGE OF 0.04-0.49 ng/mL CAN BE SEEN IN:       -UNSTABLE ANGINA       -CONGESTIVE HEART FAILURE       -MYOCARDITIS       -CHEST TRAUMA       -ARRYHTHMIAS       -LATE PRESENTING MYOCARDIAL INFARCTION       -COPD   CLINICAL FOLLOW-UP RECOMMENDED.   Hemoglobin A1c     Status: Abnormal   Collection Time: 05/10/16  4:59 PM  Result Value Ref Range   Hgb A1c MFr Bld 6.6 (H) 4.0 - 6.0 %  Glucose, capillary     Status: Abnormal   Collection Time: 05/10/16  9:16 PM  Result Value Ref Range   Glucose-Capillary 178 (H) 65 - 99 mg/dL  Troponin I     Status: None   Collection Time: 05/10/16 11:14 PM  Result Value Ref Range   Troponin I <0.03 <0.031 ng/mL    Comment:        NO INDICATION OF MYOCARDIAL INJURY.   Troponin I     Status: None   Collection Time: 05/11/16  4:25 AM  Result Value Ref Range   Troponin I 0.03 <0.031 ng/mL    Comment:        NO INDICATION OF MYOCARDIAL INJURY.   Basic metabolic panel  Status: Abnormal   Collection Time: 05/11/16  4:25 AM  Result Value Ref Range   Sodium 140 135 - 145  mmol/L   Potassium 3.4 (L) 3.5 - 5.1 mmol/L   Chloride 102 101 - 111 mmol/L   CO2 30 22 - 32 mmol/L   Glucose, Bld 107 (H) 65 - 99 mg/dL   BUN 25 (H) 6 - 20 mg/dL   Creatinine, Ser 0.60 0.44 - 1.00 mg/dL   Calcium 8.6 (L) 8.9 - 10.3 mg/dL   GFR calc non Af Amer >60 >60 mL/min   GFR calc Af Amer >60 >60 mL/min    Comment: (NOTE) The eGFR has been calculated using the CKD EPI equation. This calculation has not been validated in all clinical situations. eGFR's persistently <60 mL/min signify possible Chronic Kidney Disease.    Anion gap 8 5 - 15  CBC     Status: Abnormal   Collection Time: 05/11/16  4:25 AM  Result Value Ref Range   WBC 8.6 3.6 - 11.0 K/uL   RBC 4.20 3.80 - 5.20 MIL/uL   Hemoglobin 10.4 (L) 12.0 - 16.0 g/dL   HCT 32.6 (L) 35.0 - 47.0 %   MCV 77.7 (L) 80.0 - 100.0 fL   MCH 24.7 (L) 26.0 - 34.0 pg   MCHC 31.7 (L) 32.0 - 36.0 g/dL   RDW 20.6 (H) 11.5 - 14.5 %   Platelets 288 150 - 440 K/uL    Dg Chest Port 1 View  05/10/2016  CLINICAL DATA:  Pt states that she has been feeling weak and dizzy for the past few days, pt states that she has been diaphoretic and dragging with everything she has attempted to do, pt states she came here for pulmonary rehab. EXAM: PORTABLE CHEST 1 VIEW COMPARISON:  CT chest 04/02/2016 FINDINGS: Mild bilateral interstitial thickening. There is no focal parenchymal opacity. There is no pleural effusion or pneumothorax. There is stable cardiomegaly. The osseous structures are unremarkable. IMPRESSION: Findings most concerning for mild CHF. Electronically Signed   By: Kathreen Devoid   On: 05/10/2016 14:31    Review of Systems  Constitutional: Positive for malaise/fatigue.  HENT: Negative.   Eyes: Negative.   Respiratory: Positive for shortness of breath.   Cardiovascular: Positive for palpitations.  Gastrointestinal: Negative.   Genitourinary: Negative.   Musculoskeletal: Negative.   Skin: Negative.   Neurological: Positive for weakness.   Endo/Heme/Allergies: Negative.   Psychiatric/Behavioral: Negative.    Blood pressure 105/58, pulse 119, temperature 99.1 F (37.3 C), temperature source Oral, resp. rate 18, height _0  (1.753 m), weight 108.41 kg (239 lb), SpO2 99 %. Physical Exam  Assessment/Plan: Atrial fibrillation rapid ventricular response History of congestive heart failure diastolic dysfunction ANCA, associated with vasculitis GERD Obstructive sleep apnea Obesity Hypertension Diabetes type 2 Depression History of DVT Interstitial lung disease . PLAN Recommend rate control for atrial fibrillation Hold off on long-term anticoagulation until it's okay by pulmonary Continue diabetes management with Levemir Glucophage Hypertension control with Cardizem and Cozaar Maxide will add metoprolol Do not recommend high-level antiarrhythmic at this point we'll prefer to refer to patient to EP Continue azathioprine and prednisone vasculitis Maintain Protonix for GERD symptoms   Rim Thatch D. 05/11/2016, 1:46 PM

## 2016-05-11 NOTE — Progress Notes (Signed)
Per MD Callwood stop cardizem SA and start metoprolol PO and give 25 IV now

## 2016-05-11 NOTE — Progress Notes (Signed)
Notified patient had 5 beats of wide QRS. MD notified. Asymptomatic. Will continue to monitor.

## 2016-05-12 LAB — GLUCOSE, CAPILLARY: Glucose-Capillary: 146 mg/dL — ABNORMAL HIGH (ref 65–99)

## 2016-05-12 MED ORDER — METOPROLOL TARTRATE 25 MG PO TABS: 25.0000 mg | ORAL_TABLET | Freq: Two times a day (BID) | ORAL | Status: DC

## 2016-05-12 MED ORDER — LOSARTAN POTASSIUM 25 MG PO TABS: 25.0000 mg | ORAL_TABLET | Freq: Every day | ORAL | Status: DC

## 2016-05-12 NOTE — Progress Notes (Signed)
[place] [facility] [place] NAME: Betty [name]    MR#:  [id number]  DATE OF BIRTH:  10-06-1946  SUBJECTIVE:  CHIEF COMPLAINT:   Chief Complaint  Patient presents with  . Dizziness  HR up. sweaty  REVIEW OF SYSTEMS:  Review of Systems  Constitutional: Negative for fever, weight loss, malaise/fatigue and diaphoresis.  HENT: Negative for ear discharge, ear pain, hearing loss, nosebleeds, sore throat and tinnitus.   Eyes: Negative for blurred vision and pain.  Respiratory: Negative for cough, hemoptysis, shortness of breath and wheezing.   Cardiovascular: Positive for palpitations. Negative for chest pain, orthopnea and leg swelling.  Gastrointestinal: Negative for heartburn, nausea, vomiting, abdominal pain, diarrhea, constipation and blood in stool.  Genitourinary: Negative for dysuria, urgency and frequency.  Musculoskeletal: Negative for myalgias and back pain.  Skin: Negative for itching and rash.  Neurological: Negative for dizziness, tingling, tremors, focal weakness, seizures, weakness and headaches.  Psychiatric/Behavioral: Negative for depression. The patient is not nervous/anxious.     DRUG ALLERGIES:   Allergies  Allergen Reactions  . Amiodarone Other (See Comments)    Pt states that this medication causes lung bleeding.     VITALS:  Blood pressure 114/61, pulse 77, temperature 99.3 F (37.4 C), temperature source Oral, resp. rate 18, height 5\' 9"  (1.753 m), weight 108.41 kg (239 lb), SpO2 98 %. PHYSICAL EXAMINATION:  Physical Exam  Constitutional: She is oriented to person, place, and time and well-developed, well-nourished, and in no distress.  HENT:  Head: Normocephalic and atraumatic.  Eyes: Conjunctivae and EOM are normal. Pupils are equal, round, and reactive to light.  Neck: Normal range of motion. Neck supple. No tracheal deviation present. No thyromegaly present.  Cardiovascular: Normal heart sounds.  An  irregularly irregular rhythm present. Tachycardia present.   Pulmonary/Chest: Effort normal and breath sounds normal. No respiratory distress. She has no wheezes. She exhibits no tenderness.  Abdominal: Soft. Bowel sounds are normal. She exhibits no distension. There is no tenderness.  Musculoskeletal: Normal range of motion.  Neurological: She is alert and oriented to person, place, and time. No cranial nerve deficit.  Skin: Skin is warm and dry. No rash noted.  Psychiatric: Mood and affect normal.   LABORATORY PANEL:   CBC  Recent Labs Lab 05/11/16 0425  WBC 8.6  HGB 10.4*  HCT 32.6*  PLT 288   ------------------------------------------------------------------------------------------------------------------ Chemistries   Recent Labs Lab 05/11/16 0425  NA 140  K 3.4*  CL 102  CO2 30  GLUCOSE 107*  BUN 25*  CREATININE 0.60  CALCIUM 8.6*   RADIOLOGY:  No results found. ASSESSMENT AND PLAN:  #1 atrial fibrillation, rapid ventricular response - continue Cardizem - add metoprolol for rate control - echocardiogram was done in January 2017 revealing normal ejection fraction of 60-65%, mild LVH, will not repeat echo.   - Cardio doesn't recommend anticoagulation   #2. Acute on Chronic diastolic CHF: well compensated at this time. - continue lasix and maxzide  #3.DM - continue levemir and metformin - likely worse due to being on prednisone  #4 anemia - stable.  #5 Vasculitis - on prednisone slow taper per [facility]     All the records are reviewed and case discussed with Care Management/Social Worker. Management plans discussed with the patient, family and they are in agreement.  CODE STATUS: FULL CODE  TOTAL TIME TAKING CARE OF THIS PATIENT: 35 minutes.   More than 50% of the time was spent in  counseling/coordination of care: YES  POSSIBLE D/C IN AM, DEPENDING ON CLINICAL CONDITION.   [facility], Rahmir Beever M.D on 05/12/2016 at 11:41 AM  Between 7am to 6pm -  Pager - [phone]  After 6pm go to [email or web address] - password [email or web address] [facility]  Office  [phone]  CC: Primary care physician; Dion Body, MD  Note: This dictation was prepared with Dragon dictation along with smaller phrase technology. Any transcriptional errors that result from this process are unintentional.

## 2016-05-12 NOTE — Progress Notes (Signed)
Patient discharged to home, MD Manuella Ghazi aware pt has low grade fever of 99.3  May be d/t prednisone, but unlikely to be infectious. Pt given her DC instructions, scrips at pharmacy, IV site [facility], tele turned in.  Understanding verbalized.  Left hospital in car with her son.

## 2016-05-12 NOTE — Discharge Instructions (Signed)
Atrial Fibrillation °Atrial fibrillation is a type of heartbeat that is irregular or fast (rapid). If you have this condition, your heart keeps quivering in a weird (chaotic) way. This condition can make it so your heart cannot pump blood normally. Having this condition gives a person more risk for stroke, heart failure, and other heart problems. There are different types of atrial fibrillation. Talk with your doctor to learn about the type that you have. °HOME CARE °· Take over-the-counter and prescription medicines only as told by your doctor. °· If your doctor prescribed a blood-thinning medicine, take it exactly as told. Taking too much of it can cause bleeding. If you do not take enough of it, you will not have the protection that you need against stroke and other problems. °· Do not use any tobacco products. These include cigarettes, chewing tobacco, and e-cigarettes. If you need help quitting, ask your doctor. °· If you have apnea (obstructive sleep apnea), manage it as told by your doctor. °· Do not drink alcohol. °· Do not drink beverages that have caffeine. These include coffee, soda, and tea. °· Maintain a healthy weight. Do not use diet pills unless your doctor says they are safe for you. Diet pills may make heart problems worse. °· Follow diet instructions as told by your doctor. °· Exercise regularly as told by your doctor. °· Keep all follow-up visits as told by your doctor. This is important. °GET HELP IF: °· You notice a change in the speed, rhythm, or strength of your heartbeat. °· You are taking a blood-thinning medicine and you notice more bruising. °· You get tired more easily when you move or exercise. °GET HELP RIGHT AWAY IF: °· You have pain in your chest or your belly (abdomen). °· You have sweating or weakness. °· You feel sick to your stomach (nauseous). °· You notice blood in your throw up (vomit), poop (stool), or pee (urine). °· You are short of breath. °· You suddenly have swollen feet  and ankles. °· You feel dizzy. °· Your suddenly get weak or numb in your face, arms, or legs, especially if it happens on one side of your body. °· You have trouble talking, trouble understanding, or both. °· Your face or your eyelid droops on one side. °These symptoms may be an emergency. Do not wait to see if the symptoms will go away. Get medical help right away. Call your local emergency services (911 in the [place] Do not drive yourself to the hospital. °  °This information is not intended to replace advice given to you by your health care provider. Make sure you discuss any questions you have with your health care provider. °  °Document Released: 09/17/2008 Document Revised: 08/30/2015 Document Reviewed: 04/05/2015 °Elsevier Interactive Patient Education ©2016 [employer] ° °

## 2016-05-12 NOTE — Discharge Summary (Signed)
[name] NAME: Betty [name]    MR#:  [id number]  DATE OF BIRTH:  Feb 23, 1946  DATE OF ADMISSION:  05/10/2016 ADMITTING PHYSICIAN: Theodoro Grist, MD  DATE OF DISCHARGE: 05/12/2016  1:58 PM  PRIMARY CARE PHYSICIAN: Dion Body, MD    ADMISSION DIAGNOSIS:  Dizziness [R42] Atrial fibrillation, unspecified type ([place]) [I48.91]  DISCHARGE DIAGNOSIS:  Principal Problem:   A-fib ([place]) Active Problems:   Dyspnea   Acute diastolic CHF (congestive heart failure) (HCC)   Anemia   Anemia  SECONDARY DIAGNOSIS:   Past Medical History  Diagnosis Date  . Headache(784.0)   . Backache, unspecified   . Herpes zoster without mention of complication   . Osteoarthrosis, unspecified whether generalized or localized, unspecified site   . Nontoxic uninodular goiter   . Obesity, unspecified   . Esophageal reflux   . Unspecified sleep apnea   . Atrial fibrillation ([place])   . Acute diastolic heart failure ([place])   . Cardiomegaly   . Hypertension     heart controlled w CHF  . Asthma   . Diabetes mellitus without complication ([place])   . Allergy   . Hx: UTI (urinary tract infection)   . Urine incontinence     hx of  . 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 COPD (chronic obstructive pulmonary disease) [facility])     HOSPITAL COURSE:  #1 atrial fibrillation, rapid ventricular response - continue Cardizem and metoprolol for rate control - echocardiogram was done in January 2017 revealing normal ejection fraction of 60-65%, mild LVH, will not repeat echo.  - Cardio doesn't recommend anticoagulation  #2. Acute on Chronic diastolic CHF: well compensated at this time. - continue lasix and maxzide  #3.DM - continue levemir and metformin - likely worse due to being on prednisone  #4 anemia - stable.  #5 Vasculitis - on prednisone slow taper per [facility]  [facility]  DISCHARGE CONDITIONS:   stable  CONSULTS OBTAINED:  Treatment Team:  Yolonda Kida, MD  DRUG ALLERGIES:   Allergies  Allergen Reactions  . Amiodarone Other (See Comments)    Pt states that this medication causes lung bleeding.      DISCHARGE MEDICATIONS:   Discharge Medication List as of 05/12/2016 12:32 PM    START taking these medications   Details  metoprolol tartrate (LOPRESSOR) 25 MG tablet Take 1 tablet (25 mg total) by mouth 2 (two) times daily., Starting 05/12/2016, Until Discontinued, Normal      CONTINUE these medications which have CHANGED   Details  !! losartan (COZAAR) 25 MG tablet Take 1 tablet (25 mg total) by mouth daily., Starting 05/13/2016, Until Discontinued, Normal     !! - Potential duplicate medications found. Please discuss with provider.    CONTINUE these medications which have NOT CHANGED   Details  albuterol (PROVENTIL HFA;VENTOLIN HFA) 108 (90 Base) MCG/ACT inhaler Inhale 2 puffs into the lungs every 4 (four) hours as needed for wheezing or shortness of breath., Starting 01/17/2016, Until Discontinued, Normal    azaTHIOprine (IMURAN) 50 MG tablet Take 100 mg by mouth daily. , Until Discontinued, Historical Med    Biotin 5000 MCG CAPS Take 5,000 mcg by mouth daily. , Until Discontinued, Historical Med    calcium-vitamin D (OSCAL WITH D) 500-200 MG-UNIT per tablet Take 1 tablet by mouth daily., Until Discontinued, Historical Med    diltiazem (CARDIZEM CD) 240  MG 24 hr capsule Take 240 mg by mouth daily., Until Discontinued, Historical Med    furosemide (LASIX) 40 MG tablet Take 40 mg by mouth daily., Until Discontinued, Historical Med    gabapentin (NEURONTIN) 600 MG tablet Take 600 mg by mouth 2 (two) times daily., Until Discontinued, Historical Med    HYDROcodone-acetaminophen (NORCO/VICODIN) 5-325 MG tablet Take 1 tablet by mouth every 8 (eight) hours as needed for moderate pain., Until Discontinued, Historical Med    insulin NPH  Human (HUMULIN N,NOVOLIN N) 100 UNIT/ML injection Inject 6 Units into the skin daily before breakfast. , Starting 04/11/2016, Until Fri 04/11/17, Historical Med    !! losartan (COZAAR) 50 MG tablet Take 50 mg by mouth daily., Until Discontinued, Historical Med    metFORMIN (GLUCOPHAGE) 1000 MG tablet Take 1,000 mg by mouth 3 (three) times daily. , Until Discontinued, Historical Med    oxybutynin (DITROPAN) 5 MG tablet Take 5 mg by mouth 2 (two) times daily., Until Discontinued, Historical Med    pantoprazole (PROTONIX) 40 MG tablet Take 40 mg by mouth daily., Until Discontinued, Historical Med    predniSONE (DELTASONE) 10 MG tablet Take 30 mg by mouth daily with breakfast. , Starting 04/24/2016, Until Discontinued, Historical Med    rOPINIRole (REQUIP) 3 MG tablet Take 3 mg by mouth at bedtime., Until Discontinued, Historical Med    traZODone (DESYREL) 100 MG tablet Take 200 mg by mouth at bedtime., Until Discontinued, Historical Med    triamterene-hydrochlorothiazide (MAXZIDE-25) 37.5-25 MG tablet Take 1 tablet by mouth daily., Until Discontinued, Historical Med    Venlafaxine HCl 225 MG TB24 Take 225 mg by mouth daily., Until Discontinued, Historical Med     !! - Potential duplicate medications found. Please discuss with provider.    STOP taking these medications     warfarin (COUMADIN) 1 MG tablet      warfarin (COUMADIN) 6 MG tablet          DISCHARGE INSTRUCTIONS:    DIET:  Regular diet  DISCHARGE CONDITION:  Good  ACTIVITY:  Activity as tolerated  OXYGEN:  Home Oxygen: No.   Oxygen Delivery: room air  DISCHARGE LOCATION:  home   If you experience worsening of your admission symptoms, develop shortness of breath, life threatening emergency, suicidal or homicidal thoughts you must seek medical attention immediately by calling 911 or calling your MD immediately  if symptoms less severe.  You Must read complete instructions/literature along with all the possible  adverse reactions/side effects for all the Medicines you take and that have been prescribed to you. Take any new Medicines after you have completely understood and accpet all the possible adverse reactions/side effects.   Please note  You were cared for by a hospitalist during your hospital stay. If you have any questions about your discharge medications or the care you received while you were in the hospital after you are discharged, you can call the unit and asked to speak with the hospitalist on call if the hospitalist that took care of you is not available. Once you are discharged, your primary care physician will handle any further medical issues. Please note that NO REFILLS for any discharge medications will be authorized once you are discharged, as it is imperative that you return to your primary care physician (or establish a relationship with a primary care physician if you do not have one) for your aftercare needs so that they can reassess your need for medications and monitor your lab values.  On the day of Discharge:  VITAL SIGNS:  Blood pressure 114/61, pulse 77, temperature 99.3 F (37.4 C), temperature source Oral, resp. rate 18, height 5\' 9"  (1.753 m), weight 108.41 kg (239 lb), SpO2 98 %.  PHYSICAL EXAMINATION:  GENERAL:  70 y.o.-year-old patient lying in the bed with no acute distress.  EYES: Pupils equal, round, reactive to light and accommodation. No scleral icterus. Extraocular muscles intact.  HEENT: Head atraumatic, normocephalic. Oropharynx and nasopharynx clear.  NECK:  Supple, no jugular venous distention. No thyroid enlargement, no tenderness.  LUNGS: Normal breath sounds bilaterally, no wheezing, rales,rhonchi or crepitation. No use of accessory muscles of respiration.  CARDIOVASCULAR: S1, S2 normal. No murmurs, rubs, or gallops.  ABDOMEN: Soft, non-tender, non-distended. Bowel sounds present. No organomegaly or mass.  EXTREMITIES: No pedal edema, cyanosis, or  clubbing.  NEUROLOGIC: Cranial nerves II through XII are intact. Muscle strength 5/5 in all extremities. Sensation intact. Gait not checked.  PSYCHIATRIC: The patient is alert and oriented x 3.  SKIN: No obvious rash, lesion, or ulcer.  DATA REVIEW:   CBC  Recent Labs Lab 05/11/16 0425  WBC 8.6  HGB 10.4*  HCT 32.6*  PLT 288    Chemistries   Recent Labs Lab 05/11/16 0425  NA 140  K 3.4*  CL 102  CO2 30  GLUCOSE 107*  BUN 25*  CREATININE 0.60  CALCIUM 8.6*    Cardiac Enzymes  Recent Labs Lab 05/11/16 0425  TROPONINI 0.03    Microbiology Results  Results for orders placed or performed during the hospital encounter of 04/01/16  Blood Culture (routine x 2)     Status: None   Collection Time: 04/01/16 12:44 PM  Result Value Ref Range Status   Specimen Description BLOOD RIGHT ASSIST CONTROL  Final   Special Requests BOTTLES DRAWN AEROBIC AND ANAEROBIC  10CC  Final   Culture NO GROWTH 5 DAYS  Final   Report Status 04/06/2016 FINAL  Final  Blood Culture (routine x 2)     Status: None   Collection Time: 04/01/16 12:44 PM  Result Value Ref Range Status   Specimen Description BLOOD LEFT WRIST  Final   Special Requests   Final    BOTTLES DRAWN AEROBIC AND ANAEROBIC  AERO 13CC ANA 10CC   Culture NO GROWTH 5 DAYS  Final   Report Status 04/06/2016 FINAL  Final  Urine culture     Status: Abnormal   Collection Time: 04/01/16 12:44 PM  Result Value Ref Range Status   Specimen Description URINE, RANDOM  Final   Special Requests NONE  Final   Culture (A)  Final    80,000 COLONIES/mL ESCHERICHIA COLI ESBL-EXTENDED SPECTRUM BETA LACTAMASE-THE ORGANISM IS RESISTANT TO PENICILLINS, CEPHALOSPORINS AND AZTREONAM ACCORDING TO CLSI M100-S15 VOL.[place]. CRITICAL RESULT CALLED TO, READ BACK BY AND VERIFIED WITH: Dr. Ginette Pitman @ 1445 04/05/16 by [facility]    Report Status 04/05/2016 FINAL  Final   Organism ID, Bacteria ESCHERICHIA COLI (A)  Final      Susceptibility   Escherichia  coli - MIC*    AMPICILLIN >=32 RESISTANT Resistant     CEFAZOLIN >=64 RESISTANT Resistant     CEFTRIAXONE >=64 RESISTANT Resistant     CIPROFLOXACIN >=4 RESISTANT Resistant     GENTAMICIN <=1 SENSITIVE Sensitive     IMIPENEM <=0.25 SENSITIVE Sensitive     NITROFURANTOIN <=16 SENSITIVE Sensitive     TRIMETH/SULFA >=320 RESISTANT Resistant     AMPICILLIN/SULBACTAM 16 INTERMEDIATE Intermediate  PIP/TAZO <=4 SENSITIVE Sensitive     Extended ESBL POSITIVE Resistant     * 80,000 COLONIES/mL ESCHERICHIA COLI  Rapid Influenza A&B Antigens ([facility] only)     Status: None   Collection Time: 04/01/16 12:44 PM  Result Value Ref Range Status   Influenza A ([facility]) NEGATIVE NEGATIVE Final   Influenza B ([facility]) NEGATIVE NEGATIVE Final  MRSA PCR Screening     Status: None   Collection Time: 04/02/16  4:46 PM  Result Value Ref Range Status   MRSA by PCR NEGATIVE NEGATIVE Final    Comment:        The GeneXpert MRSA Assay (FDA approved for NASAL specimens only), is one component of a comprehensive MRSA colonization surveillance program. It is not intended to diagnose MRSA infection nor to guide or monitor treatment for MRSA infections.     Follow-up Information    Follow up with Dion Body, MD. Schedule an appointment as soon as possible for a visit in 1 week.   Specialty:  Family Medicine   Why:  [facility] Discharge F/UP   Contact information:   [place] [address] [phone]       Follow up with Yolonda Kida., MD. Schedule an appointment as soon as possible for a visit in 2 weeks.   Specialties:  Cardiology, Internal Medicine   Why:  [facility] Discharge F/UP   Contact information:   [place] [address] [phone]       Management plans discussed with the patient, family and they are in agreement.  CODE STATUS: FULL CODE  TOTAL TIME TAKING CARE OF THIS  PATIENT: 45 minutes.    [facility], Laquetta Racey M.D on 05/12/2016 at 5:03 PM  Between 7am to 6pm - Pager - [phone]  After 6pm go to [email or web address] - password [email or web address] [place] [facility]  Office  [phone]  CC: Primary care physician; Dion Body, MD   Note: This dictation was prepared with Dragon dictation along with smaller phrase technology. Any transcriptional errors that result from this process are unintentional.

## 2016-05-12 NOTE — Progress Notes (Signed)
Subjective:  Patient states within reasonably well denies palpitations tachycardia no shortness of breath no chest pain feels like she is ready to go home she's had some mild dizziness while standing up. Has not passed out she told her blood pressure may be slightly low to no medications   Objective:  Vital Signs in the last 24 hours: Temp:  [98.2 F (36.8 C)-99.1 F (37.3 C)] 98.2 F (36.8 C) (05/21 0433) Pulse Rate:  [71-119] 83 (05/21 0433) Resp:  [18] 18 (05/20 1113) BP: (105-112)/(58-79) 112/79 mmHg (05/21 0433) SpO2:  [95 %-99 %] 95 % (05/21 0433)  Intake/Output from previous day: 05/20 0701 - 05/21 0700 In: 720 [P.O.:720] Out: 0  Intake/Output from this shift:    Physical Exam: General appearance: alert and cooperative Lungs: clear to auscultation bilaterally Heart: regular rate and rhythm, S1, S2 normal, no murmur, click, rub or gallop and regular rate and rhythm Abdomen: soft, non-tender; bowel sounds normal; no masses,  no organomegaly Extremities: extremities normal, atraumatic, no cyanosis or edema Pulses: 2+ and symmetric Neurologic: Alert and oriented X 3, normal strength and tone. Normal symmetric reflexes. Normal coordination and gait  Lab Results:  Recent Labs  05/10/16 1339 05/11/16 0425  WBC 11.6* 8.6  HGB 10.6* 10.4*  PLT 310 288    Recent Labs  05/10/16 1339 05/11/16 0425  NA 140 140  K 3.9 3.4*  CL 100* 102  CO2 28 30  GLUCOSE 168* 107*  BUN 24* 25*  CREATININE 0.79 0.60    Recent Labs  05/10/16 2314 05/11/16 0425  TROPONINI <0.03 0.03   Hepatic Function Panel No results for input(s): PROT, ALBUMIN, AST, ALT, ALKPHOS, BILITOT, BILIDIR, IBILI in the last 72 hours. No results for input(s): CHOL in the last 72 hours. No results for input(s): PROTIME in the last 72 hours.  Imaging: Imaging results have been reviewed  Cardiac Studies:  Assessment/Plan:  Arrhythmia Atrial Fibrillation Palpitations  Shortness of  breath Vertigo Congestive heart failure diastolic dysfunction compensated Vasculitis ANCA Diabetes . Plan Reduce metoprolol to twice a day Reduce Cozaar to 25 mg once a day Continue diltiazem 240 daily We will continue to refrain from anticoagulation until discussed with pulmonologist Increase activity Patient should be an acceptable risk for discharge Follow-up with cardiology one to 2 weeks We will arrange to have the patient follow-up with EP as well to consider repeat ablation  LOS: 2 days    Zya Finkle D. 05/12/2016, 10:13 AM

## 2016-05-21 ENCOUNTER — Other Ambulatory Visit: Payer: Self-pay | Admitting: Internal Medicine

## 2016-05-22 ENCOUNTER — Ambulatory Visit: Payer: Medicare Other | Admitting: Internal Medicine

## 2016-05-22 DIAGNOSIS — G4730 Sleep apnea, unspecified: Secondary | ICD-10-CM

## 2016-05-22 DIAGNOSIS — J449 Chronic obstructive pulmonary disease, unspecified: Secondary | ICD-10-CM | POA: Diagnosis present

## 2016-05-22 LAB — GLUCOSE, CAPILLARY: GLUCOSE-CAPILLARY: 207 mg/dL — AB (ref 65–99)

## 2016-05-22 NOTE — Progress Notes (Signed)
Daily Session Note  Patient Details  Name: Betty [name] MRN: [id number] Date of Birth: 04/09/1946 Referring Provider:    Encounter Date: 05/22/2016  Check In:     Session Check In - 05/22/16 1240    Check-In   Location [facility]   Staff Present Carson Myrtle, BS, RRT, Respiratory Therapist;Carroll Enterkin, RN, BSN;Rebecca Sickles, DPT, Burlene Arnt, BA, ACSM CEP, Exercise Physiologist   Supervising physician immediately available to respond to emergencies [facility] immediately available ER MD   Physician(s) Drs Jacqualine Code and Jimmye Norman   Medication changes reported     Yes   Comments was in hospital last week - metoprolol changed   Fall or balance concerns reported    No   Warm-up and Cool-down Performed on first and last piece of equipment   Resistance Training Performed Yes   VAD Patient? No   Pain Assessment   Currently in Pain? No/denies   Multiple Pain Sites No         Goals Met:  Proper associated with RPD/PD & O2 Sat Independence with exercise equipment Exercise tolerated well Strength training completed today  Goals Unmet:  Not Applicable  Comments: Pt able to follow exercise prescription today without complaint.  Will continue to monitor for progression.   Dr. Emily Filbert is Medical Director for [place] and [facility].

## 2016-05-24 ENCOUNTER — Encounter: Payer: Medicare Other | Attending: Internal Medicine | Admitting: *Deleted

## 2016-05-24 DIAGNOSIS — J449 Chronic obstructive pulmonary disease, unspecified: Secondary | ICD-10-CM

## 2016-05-24 LAB — GLUCOSE, CAPILLARY
GLUCOSE-CAPILLARY: 195 mg/dL — AB (ref 65–99)
GLUCOSE-CAPILLARY: 163 mg/dL — AB (ref 65–99)

## 2016-05-24 NOTE — Progress Notes (Signed)
Daily Session Note  Patient Details  Name: BRYA SIMERLY MRN: [id number] Date of Birth: 1946-09-30 Referring Provider:    Encounter Date: 05/24/2016  Check In:     Session Check In - 05/24/16 1236    Check-In   Location [facility]   Staff Present Gerlene Burdock, RN, Vickki Hearing, BA, ACSM CEP, Exercise Physiologist;Stacey Blanch Media, RRT, RCP, Respiratory Therapist   Supervising physician immediately available to respond to emergencies [facility] immediately available ER MD   Physician(s) Dr. Izola Price and Dr. Edd Fabian   Fall or balance concerns reported    No   Warm-up and Cool-down Performed on first and last piece of equipment   Resistance Training Performed Yes   VAD Patient? No   Pain Assessment   Currently in Pain? No/denies         Goals Met:  Proper associated with RPD/PD & O2 Sat Exercise tolerated well  Goals Unmet:  Not Applicable  Comments:     Dr. Emily Filbert is Medical Director for [place] and [facility].

## 2016-05-27 DIAGNOSIS — J449 Chronic obstructive pulmonary disease, unspecified: Secondary | ICD-10-CM | POA: Diagnosis not present

## 2016-05-27 DIAGNOSIS — G4730 Sleep apnea, unspecified: Secondary | ICD-10-CM

## 2016-05-27 NOTE — Progress Notes (Signed)
Daily Session Note  Patient Details  Name: Betty [name] MRN: [id number] Date of Birth: 20-Nov-1946 Referring Provider: Dr Wynn Maudlin   Encounter Date: 05/27/2016  Check In:     Session Check In - 05/27/16 1318    Check-In   Location [facility]   Staff Present Carson Myrtle, BS, RRT, Respiratory Therapist;Kelly Amedeo Plenty, BS, ACSM CEP, Exercise Physiologist;Amanda Oletta Darter, BA, ACSM CEP, Exercise Physiologist   Supervising physician immediately available to respond to emergencies [facility] immediately available ER MD   Physician(s) Cinda Quest and [place]   Medication changes reported     No   Fall or balance concerns reported    No   Warm-up and Cool-down Performed on first and last piece of equipment   Resistance Training Performed No   VAD Patient? No   Pain Assessment   Currently in Pain? No/denies   Multiple Pain Sites No           Exercise Prescription Changes - 05/27/16 1400    Response to Exercise   Blood Pressure (Admit) 140/72 mmHg   Blood Pressure (Exercise) 132/70 mmHg   Blood Pressure (Exit) 140/78 mmHg   Heart Rate (Admit) 77 bpm   Heart Rate (Exercise) 88 bpm   Heart Rate (Exit) 72 bpm   Oxygen Saturation (Admit) 96 %   Oxygen Saturation (Exercise) 92 %   Oxygen Saturation (Exit) 96 %   Rating of Perceived Exertion (Exercise) 15   Perceived Dyspnea (Exercise) 3   Progression   Progression Continue to progress workloads to maintain intensity without signs/symptoms of physical distress.  new THRR [id number]   Resistance Training   Training Prescription Yes   Weight 2   Reps 10-15   Oxygen   Oxygen Continuous   Liters 2   Treadmill   MPH 1.4   Grade 0   Minutes 15   Recumbant Bike   Level 2   RPM 40   Watts 20   Minutes 15   NuStep   Level 2   Watts 40   Minutes 15   Recumbant Elliptical   Level 2   RPM 40   Watts 20   Minutes 15   REL-XR   Level 2   Watts 50   Minutes 15   T5 Nustep   Level 2   Watts 45   Minutes 15   Biostep-RELP   Level 3   Watts 40   Minutes 15      Goals Met:  Proper associated with RPD/PD & O2 Sat Personal goals reviewed Queuing for purse lip breathing  Goals Unmet:  O2 Sat  Comments: Increased oxygen to 3l/m to maintain O2Sat's in the 90's.   Dr. Emily Filbert is Medical Director for [place] and [facility].

## 2016-05-31 ENCOUNTER — Encounter: Payer: Medicare Other | Admitting: *Deleted

## 2016-05-31 DIAGNOSIS — G4730 Sleep apnea, unspecified: Secondary | ICD-10-CM

## 2016-05-31 DIAGNOSIS — J449 Chronic obstructive pulmonary disease, unspecified: Secondary | ICD-10-CM | POA: Diagnosis not present

## 2016-05-31 NOTE — Progress Notes (Signed)
Cardiac Individual Treatment Plan  Patient Details  Name: Betty [name] MRN: [id number] Date of Birth: 08-24-1946 Referring Provider:    Initial Encounter Date:       Pulmonary Rehab from 04/30/2016 in [facility] Cardiac and Pulmonary Rehab   Date  04/30/16      Visit Diagnosis: COPD, mild ([place])  Sleep apnea  Patient's Home Medications on Admission:  Current outpatient prescriptions:  .  albuterol (PROVENTIL HFA;VENTOLIN HFA) 108 (90 Base) MCG/ACT inhaler, Inhale 2 puffs into the lungs every 4 (four) hours as needed for wheezing or shortness of breath., Disp: 1 Inhaler, Rfl: 10 .  azaTHIOprine (IMURAN) 50 MG tablet, Take 100 mg by mouth daily. , Disp: , Rfl:  .  Biotin 5000 MCG CAPS, Take 5,000 mcg by mouth daily. , Disp: , Rfl:  .  calcium-vitamin D (OSCAL WITH D) 500-200 MG-UNIT per tablet, Take 1 tablet by mouth daily., Disp: , Rfl:  .  diltiazem (CARDIZEM CD) 240 MG 24 hr capsule, Take 240 mg by mouth daily., Disp: , Rfl:  .  furosemide (LASIX) 40 MG tablet, Take 40 mg by mouth daily., Disp: , Rfl:  .  gabapentin (NEURONTIN) 600 MG tablet, Take 600 mg by mouth 2 (two) times daily., Disp: , Rfl:  .  HYDROcodone-acetaminophen (NORCO/VICODIN) 5-325 MG tablet, Take 1 tablet by mouth every 8 (eight) hours as needed for moderate pain., Disp: , Rfl:  .  insulin NPH Human (HUMULIN N,NOVOLIN N) 100 UNIT/ML injection, Inject 6 Units into the skin daily before breakfast. , Disp: , Rfl:  .  losartan (COZAAR) 25 MG tablet, Take 1 tablet (25 mg total) by mouth daily., Disp: 30 tablet, Rfl: 0 .  losartan (COZAAR) 50 MG tablet, Take 50 mg by mouth daily., Disp: , Rfl:  .  losartan (COZAAR) 50 MG tablet, TAKE 1 TABLET BY MOUTH DAILY, Disp: 30 tablet, Rfl: 0 .  metFORMIN (GLUCOPHAGE) 1000 MG tablet, Take 1,000 mg by mouth 3 (three) times daily. , Disp: , Rfl:  .  metoprolol tartrate (LOPRESSOR) 25 MG tablet, Take 1 tablet (25 mg total) by mouth 2 (two) times daily., Disp: 60 tablet, Rfl: 0 .  oxybutynin  (DITROPAN) 5 MG tablet, Take 5 mg by mouth 2 (two) times daily., Disp: , Rfl:  .  pantoprazole (PROTONIX) 40 MG tablet, Take 40 mg by mouth daily., Disp: , Rfl:  .  predniSONE (DELTASONE) 10 MG tablet, Take 30 mg by mouth daily with breakfast. , Disp: , Rfl:  .  rOPINIRole (REQUIP) 3 MG tablet, Take 3 mg by mouth at bedtime., Disp: , Rfl:  .  traZODone (DESYREL) 100 MG tablet, Take 200 mg by mouth at bedtime., Disp: , Rfl:  .  triamterene-hydrochlorothiazide (MAXZIDE-25) 37.5-25 MG tablet, Take 1 tablet by mouth daily., Disp: , Rfl:  .  Venlafaxine HCl 225 MG TB24, Take 225 mg by mouth daily., Disp: , Rfl:   Past Medical History: Past Medical History  Diagnosis Date  . Headache(784.0)   . Backache, unspecified   . Herpes zoster without mention of complication   . Osteoarthrosis, unspecified whether generalized or localized, unspecified site   . Nontoxic uninodular goiter   . Obesity, unspecified   . Esophageal reflux   . Unspecified sleep apnea   . Atrial fibrillation ([place])   . Acute diastolic heart failure ([place])   . Cardiomegaly   . Hypertension     heart controlled w CHF  . Asthma   . Diabetes mellitus without complication ([place])   .  Allergy   . Hx: UTI (urinary tract infection)   . Urine incontinence     hx of  . 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 COPD (chronic obstructive pulmonary disease) ([facility])     Tobacco Use: History  Smoking status  . Former Smoker -- 0.50 packs/day for 15 years  . Types: Cigarettes  Smokeless tobacco  . Never Used    Comment: Has a 20-pack-year history, qutting in 1970.     Labs: Recent Review Flowsheet Data    Labs for ITP Cardiac and Pulmonary Rehab Latest Ref Rng 02/22/2014 08/16/2014 03/07/2015 04/02/2016 05/10/2016   Cholestrol 0 - 200 mg/dL - 150 153 - -   LDLCALC 0 - 99 mg/dL - 97 90 - -   HDL >39.00 mg/dL - 36.30(L) 36.60(L) - -   Trlycerides 0.0 - 149.0 mg/dL - 85.0  130.0 - -   Hemoglobin A1c 4.0 - 6.0 % 6.0 5.9 6.7(H) - 6.6(H)   PHART 7.350 - 7.450 - - - 7.43 -   PCO2ART 32.0 - 48.0 mmHg - - - 41 -   HCO3 21.0 - 28.0 mEq/L - - - 27.2 -   O2SAT - - - - 99.4 -       Exercise Target Goals:    Exercise Program Goal: Individual exercise prescription set with THRR, safety & activity barriers. Participant demonstrates ability to understand and report RPE using BORG scale, to self-measure pulse accurately, and to acknowledge the importance of the exercise prescription.  Exercise Prescription Goal: Starting with aerobic activity 30 plus minutes a day, 3 days per week for initial exercise prescription. Provide home exercise prescription and guidelines that participant acknowledges understanding prior to discharge.  Activity Barriers & Risk Stratification:     Activity Barriers & Cardiac Risk Stratification - 04/30/16 1209    Activity Barriers & Cardiac Risk Stratification   Activity Barriers Joint Problems;Back Problems  Right ankle hurts/aches, does not limit walking activity.  Does sit down to rub it.  Takng Prednisone for her lungs and her ankle is feeling better. Back wil lhurt after certain amount of activity      6 Minute Walk:     6 Minute Walk      04/30/16 1438       6 Minute Walk   Phase Initial     Distance 1000 feet     Walk Time 6 minutes     MPH 1.9     RPE 12     Perceived Dyspnea  2     Resting HR 72 bpm     Resting BP 126/60 mmHg     Max Ex. HR 106 bpm     Max Ex. BP 152/70 mmHg        Initial Exercise Prescription:     Initial Exercise Prescription - 04/30/16 1400    Date of Initial Exercise RX and Referring Provider   Date 04/30/16   Oxygen   Oxygen Continuous   Liters 2   Treadmill   MPH 1.7   Grade 0   Minutes 15   Recumbant Bike   Level 2   RPM 40   Watts 20   Minutes 15   NuStep   Level 2   Watts 40   Minutes 15   Recumbant Elliptical   Level 2   RPM 40   Watts 20   Minutes 15   REL-XR  Level 2   Watts 50   Minutes 15   T5 Nustep   Level 2   Watts 40   Minutes 15   Biostep-RELP   Level 3   Watts 40   Minutes 15   Prescription Details   Frequency (times per week) 3   Duration Progress to 45 minutes of aerobic exercise without signs/symptoms of physical distress   Intensity   THRR REST +  30   Ratings of Perceived Exertion 11-15   Perceived Dyspnea 0-4   Progression   Progression Continue to progress workloads to maintain intensity without signs/symptoms of physical distress.   Resistance Training   Training Prescription Yes   Weight 2   Reps 10-15      Perform Capillary Blood Glucose checks as needed.  Exercise Prescription Changes:     Exercise Prescription Changes      05/24/16 1400 05/27/16 1400         Response to Exercise   Blood Pressure (Admit)  140/72 mmHg      Blood Pressure (Exercise)  132/70 mmHg      Blood Pressure (Exit)  140/78 mmHg      Heart Rate (Admit)  77 bpm      Heart Rate (Exercise)  88 bpm      Heart Rate (Exit)  72 bpm      Oxygen Saturation (Admit)  96 %      Oxygen Saturation (Exercise)  92 %      Oxygen Saturation (Exit)  96 %      Rating of Perceived Exertion (Exercise)  15      Perceived Dyspnea (Exercise)  3      Progression   Progression  Continue to progress workloads to maintain intensity without signs/symptoms of physical distress.  new THRR [id number]      Resistance Training   Training Prescription Yes Yes      Weight 2 2      Reps 10-15 10-15      Oxygen   Oxygen Continuous Continuous      Liters 2 2      Treadmill   MPH 1.4 1.4      Grade 0 0      Minutes 15 15      Recumbant Bike   Level 2 2      RPM 40 40      Watts 20 20      Minutes 15 15      NuStep   Level 2 2      Watts 40 45      Minutes 15 15      Recumbant Elliptical   Level 2 2      RPM 40 40      Watts 20 20      Minutes 15 15      REL-XR   Level 2 2      Watts 50 50      Minutes 15 15      T5 Nustep   Level 2 2       Watts 45 45      Minutes 15 15      Biostep-RELP   Level 3 3      Watts 40 40      Minutes 15 15         Exercise Comments:     Exercise Comments      05/29/16 1351 05/31/16 1250  Exercise Comments Betty [name] treadmill speed was adjusted lower to allow her to complete the session with appropriate O2 saturation levels. Betty [name] had to go to the restroom 3 times during [facility] so she didn't exercise as much as she did her last session.         Discharge Exercise Prescription (Final Exercise Prescription Changes):     Exercise Prescription Changes - 05/27/16 1400    Response to Exercise   Blood Pressure (Admit) 140/72 mmHg   Blood Pressure (Exercise) 132/70 mmHg   Blood Pressure (Exit) 140/78 mmHg   Heart Rate (Admit) 77 bpm   Heart Rate (Exercise) 88 bpm   Heart Rate (Exit) 72 bpm   Oxygen Saturation (Admit) 96 %   Oxygen Saturation (Exercise) 92 %   Oxygen Saturation (Exit) 96 %   Rating of Perceived Exertion (Exercise) 15   Perceived Dyspnea (Exercise) 3   Progression   Progression Continue to progress workloads to maintain intensity without signs/symptoms of physical distress.  new THRR [id number]   Resistance Training   Training Prescription Yes   Weight 2   Reps 10-15   Oxygen   Oxygen Continuous   Liters 2   Treadmill   MPH 1.4   Grade 0   Minutes 15   Recumbant Bike   Level 2   RPM 40   Watts 20   Minutes 15   NuStep   Level 2   Watts 45   Minutes 15   Recumbant Elliptical   Level 2   RPM 40   Watts 20   Minutes 15   REL-XR   Level 2   Watts 50   Minutes 15   T5 Nustep   Level 2   Watts 45   Minutes 15   Biostep-RELP   Level 3   Watts 40   Minutes 15      Nutrition:  Target Goals: Understanding of nutrition guidelines, daily intake of sodium '1500mg'$ , cholesterol '200mg'$ , calories 30% from fat and 7% or less from saturated fats, daily to have 5 or more servings of fruits and vegetables.  Biometrics:     Pre Biometrics - 04/30/16  1436    Pre Biometrics   Height 5' 9.25" (1.759 m)   Weight 227 lb 4.8 oz (103.103 kg)   Waist Circumference 48.5 inches   Hip Circumference 50.5 inches   Waist to Hip Ratio 0.96 %   BMI (Calculated) 33.4       Nutrition Therapy Plan and Nutrition Goals:   Nutrition Discharge: Rate Your Plate Scores:   Nutrition Goals Re-Evaluation:   Psychosocial: Target Goals: Acknowledge presence or absence of depression, maximize coping skills, provide positive support system. Participant is able to verbalize types and ability to use techniques and skills needed for reducing stress and depression.  Initial Review & Psychosocial Screening:     Initial Psych Review & Screening - 04/30/16 1213    Initial Review   Current issues with Current Psychotropic Meds  Betty [name] states she worries over everything. This can increase her stress levels.   Family Dynamics   Good Support System? Yes   Concerns Recent loss of significant other   Comments Husband of 27 years died 09-19-15. "Best husband in the world"   Barriers   Psychosocial barriers to participate in program There are no identifiable barriers or psychosocial needs.;The patient should benefit from training in stress management and relaxation.   Screening Interventions   Interventions Encouraged to exercise  Quality of Life Scores:     Quality of Life - 04/30/16 1547    Quality of Life Scores   Health/Function Pre 12.86 %   Socioeconomic Pre 28 %   Psych/Spiritual Pre 18 %   Family Pre 27 %   GLOBAL Pre 18.77 %      PHQ-9:     Recent Review Flowsheet Data    Depression screen [facility] 2/9 04/30/2016 12/30/2014 10/01/2013   Decreased Interest 1 0 0   Down, Depressed, Hopeless 1 0 0   PHQ - 2 Score 2 0 0   Altered sleeping 0 - -   Tired, decreased energy 1 - -   Change in appetite 0 - -   Feeling bad or failure about yourself  0 - -   Trouble concentrating 0 - -   Moving slowly or fidgety/restless 0 - -   Suicidal  thoughts 0 - -   PHQ-9 Score 3 - -   Difficult doing work/chores Not difficult at all - -      Psychosocial Evaluation and Intervention:     Psychosocial Evaluation - 05/08/16 1243    Psychosocial Evaluation & Interventions   Interventions Encouraged to exercise with the program and follow exercise prescription   Comments Counselor met with Betty [name] today for initial psychosocial evaluation.  She is a 70 year old who has had a rough year with increased health issues including a recent hospitalization for 10 days.  She has  strong support system with a sister and her adult son and family close by.  Betty [name] is also diabetic and has sleep apnea which is being treated with a CPAP; which she reports is not currently helpful due to problems with the new mask.  Betty [name] denies a history of depression but admits she has struggled with anxiety and is on medications currently for this at bedtime only.  She has multiple stressors in her life currently including her mother recently falling and is in a nursing home; her own personal health issues and she also lost her spouse of 26 years last August - and is grieving that as well.  Counselor discussed options to help support her through this time of grief and made some recommendations to her.  Betty [name] stated she is not allowed to drive currently, which is also a stressor.  She has goals to breathe better and get stronger in general and get off the Oxygen.  Counselor will continue to follow with Betty [name] during the course of pulmonary rehab.     Continued Psychosocial Services Needed Yes  Betty [name] will benefit from a grief/loss counselor or support group.  She also needs to address her CPAP mask not working currently.        Psychosocial Re-Evaluation:   Vocational Rehabilitation: Provide vocational rehab assistance to qualifying candidates.   Vocational Rehab Evaluation & Intervention:     Vocational Rehab - 04/30/16 1212    Initial Vocational Rehab  Evaluation & Intervention   Assessment shows need for Vocational Rehabilitation No      Education: Education Goals: Education classes will be provided on a weekly basis, covering required topics. Participant will state understanding/return demonstration of topics presented.  Learning Barriers/Preferences:     Learning Barriers/Preferences - 04/30/16 1212    Learning Barriers/Preferences   Learning Barriers None   Learning Preferences Individual Instruction      Education Topics: General Nutrition Guidelines/Fats and Fiber: -Group instruction provided by verbal, written material, models and  posters to present the general guidelines for heart healthy nutrition. Gives an explanation and review of dietary fats and fiber.          Pulmonary Rehab from 05/27/2016 in [facility] Cardiac and Pulmonary Rehab   Date  05/27/16   Educator  CR   Instruction Review Code  2- meets goals/outcomes      Controlling Sodium/Reading Food Labels: -Group verbal and written material supporting the discussion of sodium use in heart healthy nutrition. Review and explanation with models, verbal and written materials for utilization of the food label.   Exercise Physiology & Risk Factors: - Group verbal and written instruction with models to review the exercise physiology of the cardiovascular system and associated critical values. Details cardiovascular disease risk factors and the goals associated with each risk factor.   Aerobic Exercise & Resistance Training: - Gives group verbal and written discussion on the health impact of inactivity. On the components of aerobic and resistive training programs and the benefits of this training and how to safely progress through these programs.   Flexibility, Balance, General Exercise Guidelines: - Provides group verbal and written instruction on the benefits of flexibility and balance training programs. Provides general exercise guidelines with specific guidelines to  those with heart or lung disease. Demonstration and skill practice provided.   Stress Management: - Provides group verbal and written instruction about the health risks of elevated stress, cause of high stress, and healthy ways to reduce stress.      Pulmonary Rehab from 05/27/2016 in [facility] Cardiac and Pulmonary Rehab   Date  05/08/16   Educator  [facility]   Instruction Review Code  2- meets goals/outcomes      Depression: - Provides group verbal and written instruction on the correlation between heart/lung disease and depressed mood, treatment options, and the stigmas associated with seeking treatment.   Anatomy & Physiology of the Heart: - Group verbal and written instruction and models provide basic cardiac anatomy and physiology, with the coronary electrical and arterial systems. Review of: AMI, Angina, Valve disease, Heart Failure, Cardiac Arrhythmia, Pacemakers, and the ICD.   Cardiac Procedures: - Group verbal and written instruction and models to describe the testing methods done to diagnose heart disease. Reviews the outcomes of the test results. Describes the treatment choices: Medical Management, Angioplasty, or Coronary Bypass Surgery.   Cardiac Medications: - Group verbal and written instruction to review commonly prescribed medications for heart disease. Reviews the medication, class of the drug, and side effects. Includes the steps to properly store meds and maintain the prescription regimen.   Go Sex-Intimacy & Heart Disease, Get SMART - Goal Setting: - Group verbal and written instruction through game format to discuss heart disease and the return to sexual intimacy. Provides group verbal and written material to discuss and apply goal setting through the application of the S.M.A.R.T. Method.   Other Matters of the Heart: - Provides group verbal, written materials and models to describe Heart Failure, Angina, Valve Disease, and Diabetes in the realm of heart disease. Includes  description of the disease process and treatment options available to the cardiac patient.   Exercise & Equipment Safety: - Individual verbal instruction and demonstration of equipment use and safety with use of the equipment.      Pulmonary Rehab from 05/27/2016 in [facility] Cardiac and Pulmonary Rehab   Date  04/30/16   Educator  SB   Instruction Review Code  2- meets goals/outcomes      Infection Prevention: - Provides  verbal and written material to individual with discussion of infection control including proper hand washing and proper equipment cleaning during exercise session.      Pulmonary Rehab from 05/27/2016 in [facility] Cardiac and Pulmonary Rehab   Date  04/30/16   Educator  SB   Instruction Review Code  2- meets goals/outcomes      Falls Prevention: - Provides verbal and written material to individual with discussion of falls prevention and safety.      Pulmonary Rehab from 05/27/2016 in [facility] Cardiac and Pulmonary Rehab   Date  04/30/16   Educator  SB   Instruction Review Code  2- meets goals/outcomes      Diabetes: - Individual verbal and written instruction to review signs/symptoms of diabetes, desired ranges of glucose level fasting, after meals and with exercise. Advice that pre and post exercise glucose checks will be done for 3 sessions at entry of program.      Pulmonary Rehab from 05/27/2016 in [facility] Cardiac and Pulmonary Rehab   Date  04/30/16   Educator  SB   Instruction Review Code  2- meets goals/outcomes       Knowledge Questionnaire Score:     Knowledge Questionnaire Score - 04/30/16 1546    Knowledge Questionnaire Score   Pre Score 4/10      Core Components/Risk Factors/Patient Goals at Admission:     Personal Goals and Risk Factors at Admission - 04/30/16 1554    Core Components/Risk Factors/Patient Goals on Admission   Sedentary Yes   Intervention Provide advice, education, support and counseling about physical activity/exercise needs.;Develop an  individualized exercise prescription for aerobic and resistive training based on initial evaluation findings, risk stratification, comorbidities and participant's personal goals.   Expected Outcomes Achievement of increased cardiorespiratory fitness and enhanced flexibility, muscular endurance and strength shown through measurements of functional capacity and personal statement of participant.   Improve shortness of breath with ADL's Yes  SOB increased with shopping.  Scores low for daily activities of living   Intervention Provide education, individualized exercise plan and daily activity instruction to help decrease symptoms of SOB with activities of daily living.   Expected Outcomes Short Term: Achieves a reduction of symptoms when performing activities of daily living.   Increase knowledge of respiratory medications and ability to use respiratory devices properly  --  Oxygen for activity.  Has Proventil as needed,      Core Components/Risk Factors/Patient Goals Review:      Goals and Risk Factor Review      05/08/16 1540 05/22/16 1533 05/27/16 1537       Core Components/Risk Factors/Patient Goals Review   Personal Goals Review Develop more efficient breathing techniques such as purse lipped breathing and diaphragmatic breathing and practicing self-pacing with activity.;Increase knowledge of respiratory medications and ability to use respiratory devices properly. Sedentary;Improve shortness of breath with ADL's;Develop more efficient breathing techniques such as purse lipped breathing and diaphragmatic breathing and practicing self-pacing with activity. Increase knowledge of respiratory medications and ability to use respiratory devices properly.     Review Reviewed PLB with Betty [name]. She had good technique, but needed queing. She needs some education on oxygen, and she would like to wean off of the oxygen. Her tubing was bent at the tank connection, so we changed cannulas.  On the Treadmill  today, Betty [name]'s  O2Sat dropped to 89%. She admitted the speed was fast at 1.73mh. We explained that time was more important than speed, so we will change her  exercise goal to 1.21mh. Reviewed PLB and the importance of breathing through her nose - she is a mouth breather.  Increased oxygen flow to 3l/m with exercise; on 2l/m, her O2Sat's were dropping to 87-88%. She has an appointment with Dr MRandol Kernfor a 652m to assess her need for oxygen during the day, especially with activity.     Expected Outcomes Continue to educate Betty [name] oxygen and look at possible weaning or the importance of wearing oxygen, especially with activity. Increase speed on the treadmill gradually with O2Sat's in the 90's. Betty [name] a treadmill at home and hope to develop a [place] and shortness of breath with her exercise goals. Continue to monitor O2Sat's with exercise goals and adjust O2 flow as needed.        Core Components/Risk Factors/Patient Goals at Discharge (Final Review):      Goals and Risk Factor Review - 05/27/16 1537    Core Components/Risk Factors/Patient Goals Review   Personal Goals Review Increase knowledge of respiratory medications and ability to use respiratory devices properly.   Review Increased oxygen flow to 3l/m with exercise; on 2l/m, her O2Sat's were dropping to 87-88%. She has an appointment with Dr MoRandol Kernor a [id number] assess her need for oxygen during the day, especially with activity.   Expected Outcomes Continue to monitor O2Sat's with exercise goals and adjust O2 flow as needed.      ITP Comments:     ITP Comments      04/30/16 1607 05/22/16 1530 05/31/16 1251       ITP Comments Med review completed  Intial ITP Continue with ITP Betty [name]'s cardiologist, Dr CalClayborn Bignesss referring her to a specialist for a possible ablation. Her last ablation was in 2004. SusKristynad [facility]'t  exercise as much as she did her last session.        Comments:

## 2016-05-31 NOTE — Progress Notes (Signed)
Daily Session Note  Patient Details  Name: Betty [name] MRN: [id number] Date of Birth: 04/28/1946 Referring Provider:    Encounter Date: 05/31/2016  Check In:     Session Check In - 05/31/16 1250    Check-In   Location [facility]   Staff Present Gerlene Burdock, RN, Vickki Hearing, BA, ACSM CEP, Exercise Physiologist;Jessica Luan Pulling, MA, ACSM RCEP, Exercise Physiologist   Supervising physician immediately available to respond to emergencies [facility] immediately available ER MD   Physician(s) Dr. Burlene Arnt and Dr. Kerman Passey   Medication changes reported     No   Warm-up and Cool-down Performed on first and last piece of equipment   Resistance Training Performed Yes   VAD Patient? No   Pain Assessment   Currently in Pain? No/denies         Goals Met:  Proper associated with RPD/PD & O2 Sat Exercise tolerated well  Goals Unmet:  Not Applicable  Comments:     Dr. Emily Filbert is Medical Director for [facility] and [facility].

## 2016-06-03 ENCOUNTER — Encounter: Payer: Self-pay | Admitting: *Deleted

## 2016-06-03 DIAGNOSIS — J449 Chronic obstructive pulmonary disease, unspecified: Secondary | ICD-10-CM

## 2016-06-03 DIAGNOSIS — G4730 Sleep apnea, unspecified: Secondary | ICD-10-CM

## 2016-06-03 NOTE — Progress Notes (Signed)
Pulmonary Individual Treatment Plan  Patient Details  Name: Betty [name] MRN: [id number] Date of Birth: [birth year] Referring Provider:        Documentation from 06/03/2016 in [facility] Cardiac and Pulmonary Rehab   Referring Provider  Dr. Wynn Maudlin      Initial Encounter Date:       Documentation from 06/03/2016 in [facility] Cardiac and Pulmonary Rehab   Date  04/30/16   Referring Provider  Dr. Wynn Maudlin      Visit Diagnosis: COPD, mild ([place])  Sleep apnea  Patient's Home Medications on Admission:  Current outpatient prescriptions:  .  albuterol (PROVENTIL HFA;VENTOLIN HFA) 108 (90 Base) MCG/ACT inhaler, Inhale 2 puffs into the lungs every 4 (four) hours as needed for wheezing or shortness of breath., Disp: 1 Inhaler, Rfl: 10 .  azaTHIOprine (IMURAN) 50 MG tablet, Take 100 mg by mouth daily. , Disp: , Rfl:  .  Biotin 5000 MCG CAPS, Take 5,000 mcg by mouth daily. , Disp: , Rfl:  .  calcium-vitamin D (OSCAL WITH D) 500-200 MG-UNIT per tablet, Take 1 tablet by mouth daily., Disp: , Rfl:  .  diltiazem (CARDIZEM CD) 240 MG 24 hr capsule, Take 240 mg by mouth daily., Disp: , Rfl:  .  furosemide (LASIX) 40 MG tablet, Take 40 mg by mouth daily., Disp: , Rfl:  .  gabapentin (NEURONTIN) 600 MG tablet, Take 600 mg by mouth 2 (two) times daily., Disp: , Rfl:  .  HYDROcodone-acetaminophen (NORCO/VICODIN) 5-325 MG tablet, Take 1 tablet by mouth every 8 (eight) hours as needed for moderate pain., Disp: , Rfl:  .  insulin NPH Human (HUMULIN N,NOVOLIN N) 100 UNIT/ML injection, Inject 6 Units into the skin daily before breakfast. , Disp: , Rfl:  .  losartan (COZAAR) 25 MG tablet, Take 1 tablet (25 mg total) by mouth daily., Disp: 30 tablet, Rfl: 0 .  losartan (COZAAR) 50 MG tablet, Take 50 mg by mouth daily., Disp: , Rfl:  .  losartan (COZAAR) 50 MG tablet, TAKE 1 TABLET BY MOUTH DAILY, Disp: 30 tablet, Rfl: 0 .  metFORMIN (GLUCOPHAGE) 1000 MG tablet, Take 1,000 mg by mouth 3 (three) times daily. ,  Disp: , Rfl:  .  metoprolol tartrate (LOPRESSOR) 25 MG tablet, Take 1 tablet (25 mg total) by mouth 2 (two) times daily., Disp: 60 tablet, Rfl: 0 .  oxybutynin (DITROPAN) 5 MG tablet, Take 5 mg by mouth 2 (two) times daily., Disp: , Rfl:  .  pantoprazole (PROTONIX) 40 MG tablet, Take 40 mg by mouth daily., Disp: , Rfl:  .  predniSONE (DELTASONE) 10 MG tablet, Take 30 mg by mouth daily with breakfast. , Disp: , Rfl:  .  rOPINIRole (REQUIP) 3 MG tablet, Take 3 mg by mouth at bedtime., Disp: , Rfl:  .  traZODone (DESYREL) 100 MG tablet, Take 200 mg by mouth at bedtime., Disp: , Rfl:  .  triamterene-hydrochlorothiazide (MAXZIDE-25) 37.5-25 MG tablet, Take 1 tablet by mouth daily., Disp: , Rfl:  .  Venlafaxine HCl 225 MG TB24, Take 225 mg by mouth daily., Disp: , Rfl:   Past Medical History: Past Medical History  Diagnosis Date  . Headache(784.0)   . Backache, unspecified   . Herpes zoster without mention of complication   . Osteoarthrosis, unspecified whether generalized or localized, unspecified site   . Nontoxic uninodular goiter   . Obesity, unspecified   . Esophageal reflux   . Unspecified sleep apnea   . Atrial fibrillation ([place])   .  Acute diastolic heart failure ([place])   . Cardiomegaly   . Hypertension     heart controlled w CHF  . Asthma   . Diabetes mellitus without complication ([place])   . Allergy   . Hx: UTI (urinary tract infection)   . Urine incontinence     hx of  . 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 COPD (chronic obstructive pulmonary disease) ([facility])     Tobacco Use: History  Smoking status  . Former Smoker -- 0.50 packs/day for 15 years  . Types: Cigarettes  Smokeless tobacco  . Never Used    Comment: Has a 20-pack-year history, qutting in 1970.     Labs: Recent Review Flowsheet Data    Labs for ITP Cardiac and Pulmonary Rehab Latest Ref Rng 02/22/2014 08/16/2014 03/07/2015 04/02/2016 05/10/2016    Cholestrol 0 - 200 mg/dL - 150 153 - -   LDLCALC 0 - 99 mg/dL - 97 90 - -   HDL >39.00 mg/dL - 36.30(L) 36.60(L) - -   Trlycerides 0.0 - 149.0 mg/dL - 85.0 130.0 - -   Hemoglobin A1c 4.0 - 6.0 % 6.0 5.9 6.7(H) - 6.6(H)   PHART 7.350 - 7.450 - - - 7.43 -   PCO2ART 32.0 - 48.0 mmHg - - - 41 -   HCO3 21.0 - 28.0 mEq/L - - - 27.2 -   O2SAT - - - - 99.4 -       ADL UCSD:   Pulmonary Function Assessment:     Pulmonary Function Assessment - 04/30/16 1600    Pulmonary Function Tests   FVC% 84 %  10/18/2015 results   FEV1% 82 %   FEV1/FVC Ratio 76   RV% 67 %   DLCO% 56 %   Breath   Shortness of Breath No  SOB increases with shopping . Does well with daily activities of living.      Exercise Target Goals: Date: 04/30/16  Exercise Program Goal: Individual exercise prescription set with THRR, safety & activity barriers. Participant demonstrates ability to understand and report RPE using BORG scale, to self-measure pulse accurately, and to acknowledge the importance of the exercise prescription.  Exercise Prescription Goal: Starting with aerobic activity 30 plus minutes a day, 3 days per week for initial exercise prescription. Provide home exercise prescription and guidelines that participant acknowledges understanding prior to discharge.  Activity Barriers & Risk Stratification:     Activity Barriers & Cardiac Risk Stratification - 04/30/16 1209    Activity Barriers & Cardiac Risk Stratification   Activity Barriers Joint Problems;Back Problems  Right ankle hurts/aches, does not limit walking activity.  Does sit down to rub it.  Takng Prednisone for her lungs and her ankle is feeling better. Back wil lhurt after certain amount of activity      6 Minute Walk:     6 Minute Walk      04/30/16 1438       6 Minute Walk   Phase Initial     Distance 1000 feet     Walk Time 6 minutes     MPH 1.9     RPE 12     Perceived Dyspnea  2     Resting HR 72 bpm     Resting BP  126/60 mmHg     Max Ex. HR 106 bpm     Max Ex. BP 152/70 mmHg  Initial Exercise Prescription:     Initial Exercise Prescription - 06/03/16 1400    Date of Initial Exercise RX and Referring Provider   Date 04/30/16   Referring Provider Dr. Wynn Maudlin      Perform Capillary Blood Glucose checks as needed.  Exercise Prescription Changes:     Exercise Prescription Changes      05/24/16 1400 05/27/16 1400         Response to Exercise   Blood Pressure (Admit)  140/72 mmHg      Blood Pressure (Exercise)  132/70 mmHg      Blood Pressure (Exit)  140/78 mmHg      Heart Rate (Admit)  77 bpm      Heart Rate (Exercise)  88 bpm      Heart Rate (Exit)  72 bpm      Oxygen Saturation (Admit)  96 %      Oxygen Saturation (Exercise)  92 %      Oxygen Saturation (Exit)  96 %      Rating of Perceived Exertion (Exercise)  15      Perceived Dyspnea (Exercise)  3      Progression   Progression  Continue to progress workloads to maintain intensity without signs/symptoms of physical distress.  new THRR [id number]      Resistance Training   Training Prescription Yes Yes      Weight 2 2      Reps 10-15 10-15      Oxygen   Oxygen Continuous Continuous      Liters 2 2      Treadmill   MPH 1.4 1.4      Grade 0 0      Minutes 15 15      Recumbant Bike   Level 2 2      RPM 40 40      Watts 20 20      Minutes 15 15      NuStep   Level 2 2      Watts 40 45      Minutes 15 15      Recumbant Elliptical   Level 2 2      RPM 40 40      Watts 20 20      Minutes 15 15      REL-XR   Level 2 2      Watts 50 50      Minutes 15 15      T5 Nustep   Level 2 2      Watts 45 45      Minutes 15 15      Biostep-RELP   Level 3 3      Watts 40 40      Minutes 15 15         Exercise Comments:     Exercise Comments      05/29/16 1351 05/31/16 1250         Exercise Comments Susans treadmill speed was adjusted lower to allow her to complete the session with appropriate O2  saturation levels. Patina had to go to the restroom 3 times during [facility] so she didn't exercise as much as she did her last session.         Discharge Exercise Prescription (Final Exercise Prescription Changes):     Exercise Prescription Changes - 05/27/16 1400    Response to Exercise   Blood Pressure (Admit) 140/72 mmHg   Blood Pressure (Exercise) 132/70  mmHg   Blood Pressure (Exit) 140/78 mmHg   Heart Rate (Admit) 77 bpm   Heart Rate (Exercise) 88 bpm   Heart Rate (Exit) 72 bpm   Oxygen Saturation (Admit) 96 %   Oxygen Saturation (Exercise) 92 %   Oxygen Saturation (Exit) 96 %   Rating of Perceived Exertion (Exercise) 15   Perceived Dyspnea (Exercise) 3   Progression   Progression Continue to progress workloads to maintain intensity without signs/symptoms of physical distress.  new THRR [id number]   Resistance Training   Training Prescription Yes   Weight 2   Reps 10-15   Oxygen   Oxygen Continuous   Liters 2   Treadmill   MPH 1.4   Grade 0   Minutes 15   Recumbant Bike   Level 2   RPM 40   Watts 20   Minutes 15   NuStep   Level 2   Watts 45   Minutes 15   Recumbant Elliptical   Level 2   RPM 40   Watts 20   Minutes 15   REL-XR   Level 2   Watts 50   Minutes 15   T5 Nustep   Level 2   Watts 45   Minutes 15   Biostep-RELP   Level 3   Watts 40   Minutes 15       Nutrition:  Target Goals: Understanding of nutrition guidelines, daily intake of sodium '1500mg'$ , cholesterol '200mg'$ , calories 30% from fat and 7% or less from saturated fats, daily to have 5 or more servings of fruits and vegetables.  Biometrics:     Pre Biometrics - 04/30/16 1436    Pre Biometrics   Height 5' 9.25" (1.759 m)   Weight 227 lb 4.8 oz (103.103 kg)   Waist Circumference 48.5 inches   Hip Circumference 50.5 inches   Waist to Hip Ratio 0.96 %   BMI (Calculated) 33.4       Nutrition Therapy Plan and Nutrition Goals:   Nutrition Discharge: Rate Your Plate  Scores:   Psychosocial: Target Goals: Acknowledge presence or absence of depression, maximize coping skills, provide positive support system. Participant is able to verbalize types and ability to use techniques and skills needed for reducing stress and depression.  Initial Review & Psychosocial Screening:     Initial Psych Review & Screening - 04/30/16 1213    Initial Review   Current issues with Current Psychotropic Meds  Malaka states she worries over everything. This can increase her stress levels.   Family Dynamics   Good Support System? Yes   Concerns Recent loss of significant other   Comments Husband of 69 years died September 21, 2015. "Best husband in the world"   Barriers   Psychosocial barriers to participate in program There are no identifiable barriers or psychosocial needs.;The patient should benefit from training in stress management and relaxation.   Screening Interventions   Interventions Encouraged to exercise      Quality of Life Scores:     Quality of Life - 04/30/16 1547    Quality of Life Scores   Health/Function Pre 12.86 %   Socioeconomic Pre 28 %   Psych/Spiritual Pre 18 %   Family Pre 27 %   GLOBAL Pre 18.77 %      PHQ-9:     Recent Review Flowsheet Data    Depression screen [facility] 2/9 04/30/2016 12/30/2014 10/01/2013   Decreased Interest 1 0 0   Down, Depressed, Hopeless 1 0 0  PHQ - 2 Score 2 0 0   Altered sleeping 0 - -   Tired, decreased energy 1 - -   Change in appetite 0 - -   Feeling bad or failure about yourself  0 - -   Trouble concentrating 0 - -   Moving slowly or fidgety/restless 0 - -   Suicidal thoughts 0 - -   PHQ-9 Score 3 - -   Difficult doing work/chores Not difficult at all - -      Psychosocial Evaluation and Intervention:     Psychosocial Evaluation - 05/08/16 1243    Psychosocial Evaluation & Interventions   Interventions Encouraged to exercise with the program and follow exercise prescription   Comments Counselor met  with Ms. Yzaguirre today for initial psychosocial evaluation.  She is a 70 year old who has had a rough year with increased health issues including a recent hospitalization for 10 days.  She has  strong support system with a sister and her adult son and family close by.  Ms. Barth is also diabetic and has sleep apnea which is being treated with a CPAP; which she reports is not currently helpful due to problems with the new mask.  Ms. Wheller denies a history of depression but admits she has struggled with anxiety and is on medications currently for this at bedtime only.  She has multiple stressors in her life currently including her mother recently falling and is in a nursing home; her own personal health issues and she also lost her spouse of 23 years last August - and is grieving that as well.  Counselor discussed options to help support her through this time of grief and made some recommendations to her.  Ms. Goodine stated she is not allowed to drive currently, which is also a stressor.  She has goals to breathe better and get stronger in general and get off the Oxygen.  Counselor will continue to follow with Ms. Hugh during the course of pulmonary rehab.     Continued Psychosocial Services Needed Yes  Ms. Neitzke will benefit from a grief/loss counselor or support group.  She also needs to address her CPAP mask not working currently.        Psychosocial Re-Evaluation:  Education: Education Goals: Education classes will be provided on a weekly basis, covering required topics. Participant will state understanding/return demonstration of topics presented.  Learning Barriers/Preferences:     Learning Barriers/Preferences - 04/30/16 1212    Learning Barriers/Preferences   Learning Barriers None   Learning Preferences Individual Instruction      Education Topics: Initial Evaluation Education: - Verbal, written and demonstration of respiratory meds, RPE/PD scales, oximetry and breathing techniques. Instruction  on use of nebulizers and MDIs: cleaning and proper use, rinsing mouth with steroid doses and importance of monitoring MDI activations.   General Nutrition Guidelines/Fats and Fiber: -Group instruction provided by verbal, written material, models and posters to present the general guidelines for heart healthy nutrition. Gives an explanation and review of dietary fats and fiber.          Pulmonary Rehab from 05/27/2016 in [facility] Cardiac and Pulmonary Rehab   Date  05/27/16   Educator  CR   Instruction Review Code  2- meets goals/outcomes      Controlling Sodium/Reading Food Labels: -Group verbal and written material supporting the discussion of sodium use in heart healthy nutrition. Review and explanation with models, verbal and written materials for utilization of the food label.   Exercise  Physiology & Risk Factors: - Group verbal and written instruction with models to review the exercise physiology of the cardiovascular system and associated critical values. Details cardiovascular disease risk factors and the goals associated with each risk factor.   Aerobic Exercise & Resistance Training: - Gives group verbal and written discussion on the health impact of inactivity. On the components of aerobic and resistive training programs and the benefits of this training and how to safely progress through these programs.   Flexibility, Balance, General Exercise Guidelines: - Provides group verbal and written instruction on the benefits of flexibility and balance training programs. Provides general exercise guidelines with specific guidelines to those with heart or lung disease. Demonstration and skill practice provided.   Stress Management: - Provides group verbal and written instruction about the health risks of elevated stress, cause of high stress, and healthy ways to reduce stress.      Pulmonary Rehab from 05/27/2016 in [facility] Cardiac and Pulmonary Rehab   Date  05/08/16   Educator  [facility]    Instruction Review Code  2- meets goals/outcomes      Depression: - Provides group verbal and written instruction on the correlation between heart/lung disease and depressed mood, treatment options, and the stigmas associated with seeking treatment.   Exercise & Equipment Safety: - Individual verbal instruction and demonstration of equipment use and safety with use of the equipment.      Pulmonary Rehab from 05/27/2016 in [facility] Cardiac and Pulmonary Rehab   Date  04/30/16   Educator  SB   Instruction Review Code  2- meets goals/outcomes      Infection Prevention: - Provides verbal and written material to individual with discussion of infection control including proper hand washing and proper equipment cleaning during exercise session.      Pulmonary Rehab from 05/27/2016 in [facility] Cardiac and Pulmonary Rehab   Date  04/30/16   Educator  SB   Instruction Review Code  2- meets goals/outcomes      Falls Prevention: - Provides verbal and written material to individual with discussion of falls prevention and safety.      Pulmonary Rehab from 05/27/2016 in [facility] Cardiac and Pulmonary Rehab   Date  04/30/16   Educator  SB   Instruction Review Code  2- meets goals/outcomes      Diabetes: - Individual verbal and written instruction to review signs/symptoms of diabetes, desired ranges of glucose level fasting, after meals and with exercise. Advice that pre and post exercise glucose checks will be done for 3 sessions at entry of program.      Pulmonary Rehab from 05/27/2016 in [facility] Cardiac and Pulmonary Rehab   Date  04/30/16   Educator  SB   Instruction Review Code  2- meets goals/outcomes      Chronic Lung Diseases: - Group verbal and written instruction to review new updates, new respiratory medications, new advancements in procedures and treatments. Provide informative websites and "800" numbers of self-education.   Lung Procedures: - Group verbal and written instruction to describe  testing methods done to diagnose lung disease. Review the outcome of test results. Describe the treatment choices: Pulmonary Function Tests, ABGs and oximetry.   Energy Conservation: - Provide group verbal and written instruction for methods to conserve energy, plan and organize activities. Instruct on pacing techniques, use of adaptive equipment and posture/positioning to relieve shortness of breath.   Triggers: - Group verbal and written instruction to review types of environmental controls: home humidity, furnaces, filters, dust  mite/pet prevention, HEPA vacuums. To discuss weather changes, air quality and the benefits of nasal washing.   Exacerbations: - Group verbal and written instruction to provide: warning signs, infection symptoms, calling MD promptly, preventive modes, and value of vaccinations. Review: effective airway clearance, coughing and/or vibration techniques. Create an Sports administrator.   Oxygen: - Individual and group verbal and written instruction on oxygen therapy. Includes supplement oxygen, available portable oxygen systems, continuous and intermittent flow rates, oxygen safety, concentrators, and Medicare reimbursement for oxygen.   Respiratory Medications: - Group verbal and written instruction to review medications for lung disease. Drug class, frequency, complications, importance of spacers, rinsing mouth after steroid MDI's, and proper cleaning methods for nebulizers.   AED/CPR: - Group verbal and written instruction with the use of models to demonstrate the basic use of the AED with the basic ABC's of resuscitation.      Pulmonary Rehab from 05/27/2016 in [facility] Cardiac and Pulmonary Rehab   Date  05/10/16   Educator  CE   Instruction Review Code  2- meets goals/outcomes      Breathing Retraining: - Provides individuals verbal and written instruction on purpose, frequency, and proper technique of diaphragmatic breathing and pursed-lipped breathing. Applies  individual practice skills.   Anatomy and Physiology of the Lungs: - Group verbal and written instruction with the use of models to provide basic lung anatomy and physiology related to function, structure and complications of lung disease.   Heart Failure: - Group verbal and written instruction on the basics of heart failure: signs/symptoms, treatments, explanation of ejection fraction, enlarged heart and cardiomyopathy.   Sleep Apnea: - Individual verbal and written instruction to review Obstructive Sleep Apnea. Review of risk factors, methods for diagnosing and types of masks and machines for OSA.   Anxiety: - Provides group, verbal and written instruction on the correlation between heart/lung disease and anxiety, treatment options, and management of anxiety.   Relaxation: - Provides group, verbal and written instruction about the benefits of relaxation for patients with heart/lung disease. Also provides patients with examples of relaxation techniques.      Pulmonary Rehab from 05/27/2016 in [facility] Cardiac and Pulmonary Rehab   Date  05/22/16   Educator  [facility]   Instruction Review Code  2- Meets goals/outcomes      Knowledge Questionnaire Score:     Knowledge Questionnaire Score - 04/30/16 1546    Knowledge Questionnaire Score   Pre Score 4/10       Core Components/Risk Factors/Patient Goals at Admission:     Personal Goals and Risk Factors at Admission - 04/30/16 1554    Core Components/Risk Factors/Patient Goals on Admission   Sedentary Yes   Intervention Provide advice, education, support and counseling about physical activity/exercise needs.;Develop an individualized exercise prescription for aerobic and resistive training based on initial evaluation findings, risk stratification, comorbidities and participant's personal goals.   Expected Outcomes Achievement of increased cardiorespiratory fitness and enhanced flexibility, muscular endurance and strength shown through  measurements of functional capacity and personal statement of participant.   Improve shortness of breath with ADL's Yes  SOB increased with shopping.  Scores low for daily activities of living   Intervention Provide education, individualized exercise plan and daily activity instruction to help decrease symptoms of SOB with activities of daily living.   Expected Outcomes Short Term: Achieves a reduction of symptoms when performing activities of daily living.   Increase knowledge of respiratory medications and ability to use respiratory devices properly  --  Oxygen  for activity.  Has Proventil as needed,      Core Components/Risk Factors/Patient Goals Review:      Goals and Risk Factor Review      05/08/16 1540 05/22/16 1533 05/27/16 1537       Core Components/Risk Factors/Patient Goals Review   Personal Goals Review Develop more efficient breathing techniques such as purse lipped breathing and diaphragmatic breathing and practicing self-pacing with activity.;Increase knowledge of respiratory medications and ability to use respiratory devices properly. Sedentary;Improve shortness of breath with ADL's;Develop more efficient breathing techniques such as purse lipped breathing and diaphragmatic breathing and practicing self-pacing with activity. Increase knowledge of respiratory medications and ability to use respiratory devices properly.     Review Reviewed PLB with Ms Heringer. She had good technique, but needed queing. She needs some education on oxygen, and she would like to wean off of the oxygen. Her tubing was bent at the tank connection, so we changed cannulas.  On the Treadmill today, Ms Pryce's  O2Sat dropped to 89%. She admitted the speed was fast at 1.53mh. We explained that time was more important than speed, so we will change her exercise goal to 1.48m. Reviewed PLB and the importance of breathing through her nose - she is a mouth breather.  Increased oxygen flow to 3l/m with exercise; on  2l/m, her O2Sat's were dropping to 87-88%. She has an appointment with Dr MoRandol Kernor a 7m30mto assess her need for oxygen during the day, especially with activity.     Expected Outcomes Continue to educate Ms OweVanbuskirk oxygen and look at possible weaning or the importance of wearing oxygen, especially with activity. Increase speed on the treadmill gradually with O2Sat's in the 90's. Ms OweElliffs a treadmill at home and hope to develop a home plan for discharge from [place] monitoring PLB and shortness of breath with her exercise goals. Continue to monitor O2Sat's with exercise goals and adjust O2 flow as needed.        Core Components/Risk Factors/Patient Goals at Discharge (Final Review):      Goals and Risk Factor Review - 05/27/16 1537    Core Components/Risk Factors/Patient Goals Review   Personal Goals Review Increase knowledge of respiratory medications and ability to use respiratory devices properly.   Review Increased oxygen flow to 3l/m with exercise; on 2l/m, her O2Sat's were dropping to 87-88%. She has an appointment with Dr MorRandol Kernr a [id number] assess her need for oxygen during the day, especially with activity.   Expected Outcomes Continue to monitor O2Sat's with exercise goals and adjust O2 flow as needed.      ITP Comments:     ITP Comments      04/30/16 1607 05/22/16 1530 05/31/16 1251       ITP Comments Med review completed  Intial ITP Continue with ITP MS Mehan's cardiologist, Dr CallClayborn Bigness referring her to a specialist for a possible ablation. Her last ablation was in 2004. SusaKarlin to go [facility] as she did her last session.        Comments: 30 Day Review

## 2016-06-03 NOTE — Progress Notes (Signed)
Daily Session Note  Patient Details  Name: Betty [name] MRN: [id number] Date of Birth: [birth year] Referring Provider:    Encounter Date: 06/03/2016  Check In:     Session Check In - 06/03/16 1305    Check-In   Location [facility]-Cardiac & Pulmonary Rehab   Staff Present Carson Myrtle, BS, RRT, Respiratory Therapist;Demetrio Leighty Brayton El, DPT, Ronaldo Miyamoto, BS, ACSM CEP, Exercise Physiologist   Supervising physician immediately available to respond to emergencies [facility] immediately available ER MD   Physician(s) Marcelene Butte and Joni Fears   Medication changes reported     No   Fall or balance concerns reported    No   Warm-up and Cool-down Performed on first and last piece of equipment   Resistance Training Performed Yes   VAD Patient? No   Pain Assessment   Currently in Pain? No/denies         Goals Met:  Exercise tolerated well  Goals Unmet:  Not Applicable  Comments: Patient completed exercise prescription and all exercise goals during rehab session. The exercise was tolerated well and the patient is progressing in the program.    Dr. Emily Filbert is Medical Director for [place] and [facility].

## 2016-06-03 NOTE — Progress Notes (Signed)
Daily Session Note  Patient Details  Name: Betty [name] MRN: [id number] Date of Birth: Jan 07, 1946 Referring Provider:        Documentation from 06/03/2016 in [facility] Card[facility] and Pulmonary Rehab   Referring Provider  Betty [name]      Encounter Date: 06/03/2016  Check In:     Session Check In - 06/03/16 1305    Check-In   Location [facility]   Staff Present Betty [name], BS, RRT, Respiratory Therapist;Betty [name], DPT, Betty [name], BS, ACSM CEP, Exercise Physiologist   Supervising physician immediately available to respond to emergencies [facility] immediately available ER MD   Physician(s) Betty [name] and Betty [name]   Medication changes reported     No   Fall or balance concerns reported    No   Warm-up and Cool-down Performed on first and last piece of equipment   Resistance Training Performed Yes   VAD Patient? No   Pain Assessment   Currently in Pain? No/denies         Goals Met:  Betty [name] arrived to [facility] and had not taken her medicine. We had asked her  to wait on her lasix , but not her other medicines. She did exercise on the NS, but heart rates ranged from 132 to 147; so we stopped exercise and sent her home, but to call Betty [name] if HR's continued to be high inthe 140-150's.  Goals Unmet:  HR  Comments: Betty [name] did bring her medications, and she did take them when she arrived. She was not symptomatic with the atrial fib and her heart rate was lower to the 120's when she left. I called her at 4pm and her heart rate was 79-84.   Betty [name] is Medical Director for [place] and [facility].

## 2016-06-05 ENCOUNTER — Encounter: Payer: Medicare Other | Admitting: *Deleted

## 2016-06-05 DIAGNOSIS — J449 Chronic obstructive pulmonary disease, unspecified: Secondary | ICD-10-CM | POA: Diagnosis not present

## 2016-06-05 DIAGNOSIS — G4730 Sleep apnea, unspecified: Secondary | ICD-10-CM

## 2016-06-05 NOTE — Progress Notes (Signed)
Daily Session Note  Patient Details  Name: SHERRICA NIEHAUS MRN: [id number] Date of Birth: 14-Nov-1946 Referring Provider:        Documentation from 06/03/2016 in [facility] Cardiac and Pulmonary Rehab   Referring Provider  Dr. Wynn Maudlin      Encounter Date: 06/05/2016  Check In:     Session Check In - 06/05/16 1200    Check-In   Location [facility]   Staff Present Alberteen Sam, MA, ACSM RCEP, Exercise Physiologist;Laureen Owens Shark, BS, RRT, Respiratory Therapist;Diane Joya Gaskins, RN, BSN   Supervising physician immediately available to respond to emergencies [facility] immediately available ER MD   Physician(s) Archie Balboa and Reita Cliche   Medication changes reported     No   Fall or balance concerns reported    No   Warm-up and Cool-down Performed on first and last piece of equipment   Resistance Training Performed Yes   VAD Patient? No   Pain Assessment   Currently in Pain? No/denies   Multiple Pain Sites No         Goals Met:  Proper associated with RPD/PD & O2 Sat Independence with exercise equipment Using PLB without cueing & demonstrates good technique Exercise tolerated well Strength training completed today  Goals Unmet:  Not Applicable  Comments: Pt able to follow exercise prescription today without complaint.  Will continue to monitor for progression.    Dr. Emily Filbert is Medical Director for [place] and [facility].

## 2016-06-05 NOTE — Progress Notes (Signed)
Daily Session Note  Patient Details  Name: Betty [name] MRN: [id number] Date of Birth: 1946/04/23 Referring Provider:        Documentation from 06/03/2016 in [facility] Cardiac and Pulmonary Rehab   Referring Provider  Dr. Wynn Maudlin      Encounter Date: 06/05/2016  Check In:     Session Check In - 06/05/16 1200    Check-In   Location [facility]   Staff Present Alberteen Sam, MA, ACSM RCEP, Exercise Physiologist;Laureen Owens Shark, BS, RRT, Respiratory Therapist;Merton Wadlow Joya Gaskins, RN, BSN   Supervising physician immediately available to respond to emergencies [facility] immediately available ER MD   Physician(s) Archie Balboa and Reita Cliche   Medication changes reported     No   Fall or balance concerns reported    No   Warm-up and Cool-down Performed on first and last piece of equipment   Resistance Training Performed Yes   VAD Patient? No   Pain Assessment   Currently in Pain? No/denies   Multiple Pain Sites No         Goals Met:  Proper associated with RPD/PD & O2 Sat Exercise tolerated well Strength training completed today  Goals Unmet:  Not Applicable  Comments:  Pt able to follow exercise prescription today without complaint.  Will continue to monitor for progression.    Dr. Emily Filbert is Medical Director for [place] and [facility].

## 2016-06-07 ENCOUNTER — Encounter: Payer: Medicare Other | Admitting: Respiratory Therapy

## 2016-06-07 DIAGNOSIS — J449 Chronic obstructive pulmonary disease, unspecified: Secondary | ICD-10-CM

## 2016-06-07 DIAGNOSIS — G4730 Sleep apnea, unspecified: Secondary | ICD-10-CM

## 2016-06-07 NOTE — Progress Notes (Signed)
Daily Session Note  Patient Details  Name: Betty [name] MRN: [id number] Date of Birth: [birth year] Referring Provider:        Documentation from 06/03/2016 in [facility] Cardiac and Pulmonary Rehab   Referring Provider  Dr. Wynn Maudlin      Encounter Date: 06/07/2016  Check In:     Session Check In - 06/07/16 1302    Check-In   Location [facility] Pul[facility]   Staff Present Nada Maclachlan, BA, ACSM CEP, Exercise Physiologist;Jessica Luan Pulling, MA, ACSM RCEP, Exercise Physiologist;Taylie Helder Blanch Media, RRT, RCP, Respiratory Therapist   Supervising physician immediately available to respond to emergencies [facility] immediately available ER MD   Physician(s) Dr. Marcelene Butte and Jacqualine Code   Medication changes reported     No   Fall or balance concerns reported    No   Warm-up and Cool-down Performed on first and last piece of equipment   Resistance Training Performed Yes   VAD Patient? No   Pain Assessment   Currently in Pain? No/denies         Goals Met:  Proper associated with RPD/PD & O2 Sat Independence with exercise equipment Using PLB without cueing & demonstrates good technique Exercise tolerated well Strength training completed today  Goals Unmet:  Not Applicable  Comments: Pt able to follow exercise prescription today without complaint.  Will continue to monitor for progression.    Dr. Emily Filbert is Medical Director for [place] and [facility].

## 2016-06-10 ENCOUNTER — Encounter: Payer: Medicare Other | Admitting: Respiratory Therapy

## 2016-06-10 DIAGNOSIS — G4730 Sleep apnea, unspecified: Secondary | ICD-10-CM

## 2016-06-10 DIAGNOSIS — J449 Chronic obstructive pulmonary disease, unspecified: Secondary | ICD-10-CM | POA: Diagnosis not present

## 2016-06-10 NOTE — Progress Notes (Signed)
Daily Session Note  Patient Details  Name: ULANDA TACKETT MRN: [id number] Date of Birth: 03-16-1946 Referring Provider:        Documentation from 06/03/2016 in [facility] Cardiac and Pulmonary Rehab   Referring Provider  Dr. Wynn Maudlin      Encounter Date: 06/10/2016  Check In:     Session Check In - 06/10/16 1130    Check-In   Location [facility]   Staff Present Carson Myrtle, BS, RRT, Respiratory Lennie Hummer, MA, ACSM RCEP, Exercise Physiologist;Kelly Amedeo Plenty, BS, ACSM CEP, Exercise Physiologist   Supervising physician immediately available to respond to emergencies [facility] immediately available ER MD   Physician(s) Clearnce Hasten and Joni Fears   Medication changes reported     No   Fall or balance concerns reported    No   Warm-up and Cool-down Performed on first and last piece of equipment   Resistance Training Performed Yes   Pain Assessment   Currently in Pain? No/denies         Goals Met:  Proper associated with RPD/PD & O2 Sat Independence with exercise equipment Using PLB without cueing & demonstrates good technique Exercise tolerated well Strength training completed today  Goals Unmet:  Not Applicable  Comments: Pt able to follow exercise prescription today without complaint.  Will continue to monitor for progression.   Dr. Emily Filbert is Medical Director for [place] and [facility].

## 2016-06-12 ENCOUNTER — Encounter: Payer: Medicare Other | Admitting: *Deleted

## 2016-06-12 DIAGNOSIS — G4730 Sleep apnea, unspecified: Secondary | ICD-10-CM

## 2016-06-12 DIAGNOSIS — J449 Chronic obstructive pulmonary disease, unspecified: Secondary | ICD-10-CM | POA: Diagnosis not present

## 2016-06-12 NOTE — Progress Notes (Signed)
Daily Session Note  Patient Details  Name: BREEZY HERTENSTEIN MRN: [id number] Date of Birth: 01/05/1946 Referring Provider:        Documentation from 06/03/2016 in [facility] Cardiac and Pulmonary Rehab   Referring Provider  Dr. Wynn Maudlin      Encounter Date: 06/12/2016  Check In:     Session Check In - 06/12/16 1208    Check-In   Location [facility] Pulmonary Re[facility]   Staff Present Carson Myrtle, BS, RRT, Respiratory Lennie Hummer, MA, ACSM RCEP, Exercise Physiologist;Diane Joya Gaskins, RN, BSN   Supervising physician immediately available to respond to emergencies [facility] immediately available ER MD   Physician(s) Lovena Le and Paduchowski   Medication changes reported     No   Fall or balance concerns reported    No   Warm-up and Cool-down Performed on first and last piece of equipment   Resistance Training Performed Yes   VAD Patient? No   Pain Assessment   Currently in Pain? No/denies         Goals Met:  Proper associated with RPD/PD & O2 Sat Independence with exercise equipment Exercise tolerated well Strength training completed today  Goals Unmet:  Not Applicable  Comments:  Pt able to follow exercise prescription today without complaint.  Will continue to monitor for progression.    Dr. Emily Filbert is Medical Director for [place] and [facility].

## 2016-06-14 ENCOUNTER — Encounter: Payer: Medicare Other | Admitting: Respiratory Therapy

## 2016-06-14 DIAGNOSIS — J449 Chronic obstructive pulmonary disease, unspecified: Secondary | ICD-10-CM | POA: Diagnosis not present

## 2016-06-14 DIAGNOSIS — G4730 Sleep apnea, unspecified: Secondary | ICD-10-CM

## 2016-06-14 NOTE — Progress Notes (Signed)
Daily Session Note  Patient Details  Name: Betty [name] MRN: [id number] Date of Birth: 1946-05-06 Referring Provider:        Documentation from 06/03/2016 in [facility] Cardiac and Pulmonary Rehab   Referring Provider  Dr. Wynn Maudlin      Encounter Date: 06/14/2016  Check In:     Session Check In - 06/14/16 1203    Check-In   Warm-up and Cool-down Performed on first and last piece of equipment  Ms. Henner only exercised on the NS today due to afib and not feeling well.   Resistance Training Performed Yes   VAD Patient? No   Pain Assessment   Currently in Pain? No/denies         Goals Met:  Proper associated with RPD/PD & O2 Sat Independence with exercise equipment Using PLB without cueing & demonstrates good technique Personal goals reviewed Strength training completed today  Goals Unmet:  Not Applicable  Comments: Ms. Butrick is in a-fib and only want to exercise on the NS today.  She has an appointment with Dr. Clayborn Bigness on 06/21/16 and she say that he is trying to get her an appointment with an electrophysiologist.  She has had several cardioversions and one ablation.    Dr. Emily Filbert is Medical Director for [place] and [facility].

## 2016-06-18 ENCOUNTER — Telehealth: Payer: Self-pay

## 2016-06-18 NOTE — Telephone Encounter (Signed)
Betty [name] called to say she was very short of breath and would not be attending [facility] today.  I spoke with Betty [name] RRT and she suggested Betty [name] wait til after  her Dr appointment Friday regarding a-fib  before returning to [facility].

## 2016-06-24 ENCOUNTER — Encounter: Payer: Self-pay | Admitting: Respiratory Therapy

## 2016-06-24 ENCOUNTER — Encounter: Payer: Medicare Other | Attending: Internal Medicine

## 2016-06-24 DIAGNOSIS — G4730 Sleep apnea, unspecified: Secondary | ICD-10-CM

## 2016-06-24 DIAGNOSIS — J449 Chronic obstructive pulmonary disease, unspecified: Secondary | ICD-10-CM

## 2016-06-24 NOTE — Progress Notes (Signed)
Betty [name] called today and has started on a new medicine for her atrial fibrillation - Flecainide - which has made her nauseated and has not changed her atrial fib. She has a call into the physician who is hesitant to do an ablation due to her lung condition.

## 2016-06-25 ENCOUNTER — Other Ambulatory Visit: Payer: Self-pay

## 2016-06-25 ENCOUNTER — Encounter: Payer: Self-pay | Admitting: *Deleted

## 2016-06-25 ENCOUNTER — Emergency Department: Payer: Medicare Other

## 2016-06-25 ENCOUNTER — Observation Stay
Admission: EM | Admit: 2016-06-25 | Discharge: 2016-06-28 | Disposition: A | Discharge status: Home / Self Care | Payer: Medicare Other | Attending: Internal Medicine | Admitting: Internal Medicine

## 2016-06-25 DIAGNOSIS — Z79899 Other long term (current) drug therapy: Secondary | ICD-10-CM | POA: Insufficient documentation

## 2016-06-25 DIAGNOSIS — T462X5A Adverse effect of other antidysrhythmic drugs, initial encounter: Secondary | ICD-10-CM | POA: Insufficient documentation

## 2016-06-25 DIAGNOSIS — M1990 Unspecified osteoarthritis, unspecified site: Secondary | ICD-10-CM | POA: Insufficient documentation

## 2016-06-25 DIAGNOSIS — Z86010 Personal history of colonic polyps: Secondary | ICD-10-CM | POA: Insufficient documentation

## 2016-06-25 DIAGNOSIS — Z9049 Acquired absence of other specified parts of digestive tract: Secondary | ICD-10-CM | POA: Insufficient documentation

## 2016-06-25 DIAGNOSIS — R55 Syncope and collapse: Principal | ICD-10-CM | POA: Insufficient documentation

## 2016-06-25 DIAGNOSIS — G2581 Restless legs syndrome: Secondary | ICD-10-CM | POA: Insufficient documentation

## 2016-06-25 DIAGNOSIS — I110 Hypertensive heart disease with heart failure: Secondary | ICD-10-CM | POA: Insufficient documentation

## 2016-06-25 DIAGNOSIS — I776 Arteritis, unspecified: Secondary | ICD-10-CM | POA: Insufficient documentation

## 2016-06-25 DIAGNOSIS — Z8261 Family history of arthritis: Secondary | ICD-10-CM | POA: Insufficient documentation

## 2016-06-25 DIAGNOSIS — J849 Interstitial pulmonary disease, unspecified: Secondary | ICD-10-CM | POA: Insufficient documentation

## 2016-06-25 DIAGNOSIS — W19XXXA Unspecified fall, initial encounter: Secondary | ICD-10-CM | POA: Diagnosis not present

## 2016-06-25 DIAGNOSIS — J449 Chronic obstructive pulmonary disease, unspecified: Secondary | ICD-10-CM | POA: Diagnosis not present

## 2016-06-25 DIAGNOSIS — Z794 Long term (current) use of insulin: Secondary | ICD-10-CM | POA: Insufficient documentation

## 2016-06-25 DIAGNOSIS — Z8249 Family history of ischemic heart disease and other diseases of the circulatory system: Secondary | ICD-10-CM | POA: Insufficient documentation

## 2016-06-25 DIAGNOSIS — K219 Gastro-esophageal reflux disease without esophagitis: Secondary | ICD-10-CM | POA: Insufficient documentation

## 2016-06-25 DIAGNOSIS — E559 Vitamin D deficiency, unspecified: Secondary | ICD-10-CM | POA: Diagnosis not present

## 2016-06-25 DIAGNOSIS — Z800 Family history of malignant neoplasm of digestive organs: Secondary | ICD-10-CM | POA: Insufficient documentation

## 2016-06-25 DIAGNOSIS — M549 Dorsalgia, unspecified: Secondary | ICD-10-CM | POA: Insufficient documentation

## 2016-06-25 DIAGNOSIS — I517 Cardiomegaly: Secondary | ICD-10-CM | POA: Diagnosis not present

## 2016-06-25 DIAGNOSIS — R32 Unspecified urinary incontinence: Secondary | ICD-10-CM | POA: Insufficient documentation

## 2016-06-25 DIAGNOSIS — E876 Hypokalemia: Secondary | ICD-10-CM | POA: Insufficient documentation

## 2016-06-25 DIAGNOSIS — S0990XA Unspecified injury of head, initial encounter: Secondary | ICD-10-CM | POA: Diagnosis present

## 2016-06-25 DIAGNOSIS — F330 Major depressive disorder, recurrent, mild: Secondary | ICD-10-CM | POA: Insufficient documentation

## 2016-06-25 DIAGNOSIS — Z9981 Dependence on supplemental oxygen: Secondary | ICD-10-CM | POA: Insufficient documentation

## 2016-06-25 DIAGNOSIS — G8929 Other chronic pain: Secondary | ICD-10-CM | POA: Insufficient documentation

## 2016-06-25 DIAGNOSIS — I482 Chronic atrial fibrillation: Secondary | ICD-10-CM | POA: Diagnosis not present

## 2016-06-25 DIAGNOSIS — Z87891 Personal history of nicotine dependence: Secondary | ICD-10-CM | POA: Insufficient documentation

## 2016-06-25 DIAGNOSIS — E041 Nontoxic single thyroid nodule: Secondary | ICD-10-CM | POA: Diagnosis not present

## 2016-06-25 DIAGNOSIS — Z888 Allergy status to other drugs, medicaments and biological substances status: Secondary | ICD-10-CM | POA: Insufficient documentation

## 2016-06-25 DIAGNOSIS — I480 Paroxysmal atrial fibrillation: Secondary | ICD-10-CM | POA: Insufficient documentation

## 2016-06-25 DIAGNOSIS — Z823 Family history of stroke: Secondary | ICD-10-CM | POA: Insufficient documentation

## 2016-06-25 DIAGNOSIS — G4733 Obstructive sleep apnea (adult) (pediatric): Secondary | ICD-10-CM | POA: Insufficient documentation

## 2016-06-25 DIAGNOSIS — I5030 Unspecified diastolic (congestive) heart failure: Secondary | ICD-10-CM | POA: Diagnosis not present

## 2016-06-25 DIAGNOSIS — Z6836 Body mass index (BMI) 36.0-36.9, adult: Secondary | ICD-10-CM | POA: Insufficient documentation

## 2016-06-25 DIAGNOSIS — I509 Heart failure, unspecified: Secondary | ICD-10-CM

## 2016-06-25 DIAGNOSIS — F5101 Primary insomnia: Secondary | ICD-10-CM | POA: Insufficient documentation

## 2016-06-25 DIAGNOSIS — Z86718 Personal history of other venous thrombosis and embolism: Secondary | ICD-10-CM | POA: Insufficient documentation

## 2016-06-25 DIAGNOSIS — R262 Difficulty in walking, not elsewhere classified: Secondary | ICD-10-CM | POA: Insufficient documentation

## 2016-06-25 DIAGNOSIS — I371 Nonrheumatic pulmonary valve insufficiency: Secondary | ICD-10-CM | POA: Insufficient documentation

## 2016-06-25 DIAGNOSIS — Z7901 Long term (current) use of anticoagulants: Secondary | ICD-10-CM | POA: Insufficient documentation

## 2016-06-25 DIAGNOSIS — E1142 Type 2 diabetes mellitus with diabetic polyneuropathy: Secondary | ICD-10-CM | POA: Insufficient documentation

## 2016-06-25 DIAGNOSIS — D649 Anemia, unspecified: Secondary | ICD-10-CM | POA: Insufficient documentation

## 2016-06-25 DIAGNOSIS — R9431 Abnormal electrocardiogram [ECG] [EKG]: Secondary | ICD-10-CM | POA: Diagnosis present

## 2016-06-25 DIAGNOSIS — R0902 Hypoxemia: Secondary | ICD-10-CM | POA: Insufficient documentation

## 2016-06-25 DIAGNOSIS — I4891 Unspecified atrial fibrillation: Secondary | ICD-10-CM | POA: Diagnosis present

## 2016-06-25 DIAGNOSIS — Z90710 Acquired absence of both cervix and uterus: Secondary | ICD-10-CM | POA: Insufficient documentation

## 2016-06-25 DIAGNOSIS — S0101XA Laceration without foreign body of scalp, initial encounter: Secondary | ICD-10-CM | POA: Insufficient documentation

## 2016-06-25 DIAGNOSIS — I6523 Occlusion and stenosis of bilateral carotid arteries: Secondary | ICD-10-CM | POA: Insufficient documentation

## 2016-06-25 DIAGNOSIS — I081 Rheumatic disorders of both mitral and tricuspid valves: Secondary | ICD-10-CM | POA: Insufficient documentation

## 2016-06-25 DIAGNOSIS — N390 Urinary tract infection, site not specified: Secondary | ICD-10-CM | POA: Insufficient documentation

## 2016-06-25 HISTORY — DX: Syncope and collapse: R55

## 2016-06-25 LAB — BASIC METABOLIC PANEL
ANION GAP: 11 (ref 5–15)
BUN: 18 mg/dL (ref 6–20)
CALCIUM: 8.8 mg/dL — AB (ref 8.9–10.3)
CO2: 29 mmol/L (ref 22–32)
CREATININE: 0.85 mg/dL (ref 0.44–1.00)
Chloride: 99 mmol/L — ABNORMAL LOW (ref 101–111)
GLUCOSE: 141 mg/dL — AB (ref 65–99)
Potassium: 3.2 mmol/L — ABNORMAL LOW (ref 3.5–5.1)
Sodium: 139 mmol/L (ref 135–145)

## 2016-06-25 LAB — CBC
HCT: 29.9 % — ABNORMAL LOW (ref 35.0–47.0)
HEMOGLOBIN: 9.4 g/dL — AB (ref 12.0–16.0)
MCH: 24.7 pg — AB (ref 26.0–34.0)
MCHC: 31.4 g/dL — AB (ref 32.0–36.0)
MCV: 78.6 fL — ABNORMAL LOW (ref 80.0–100.0)
PLATELETS: 373 10*3/uL (ref 150–440)
RBC: 3.81 MIL/uL (ref 3.80–5.20)
RDW: 19.7 % — ABNORMAL HIGH (ref 11.5–14.5)
WBC: 10.9 10*3/uL (ref 3.6–11.0)

## 2016-06-25 LAB — PROTIME-INR
INR: 3.94
PROTHROMBIN TIME: 37.6 s — AB (ref 11.4–15.0)

## 2016-06-25 LAB — TROPONIN I: Troponin I: 0.03 ng/mL (ref <0.03)

## 2016-06-25 MED ORDER — SODIUM CHLORIDE 0.9 % IV BOLUS (SEPSIS)
250.0000 mL | Freq: Once | INTRAVENOUS | Status: AC
  Administered 2016-06-25: 250 mL via INTRAVENOUS

## 2016-06-25 MED ORDER — ACETAMINOPHEN 325 MG PO TABS
650.0000 mg | ORAL_TABLET | Freq: Once | ORAL | Status: AC
  Administered 2016-06-25: 650 mg via ORAL

## 2016-06-25 MED ORDER — ACETAMINOPHEN 325 MG PO TABS
ORAL_TABLET | ORAL | Status: AC
  Administered 2016-06-25: 650 mg via ORAL
  Filled 2016-06-25: qty 2

## 2016-06-25 NOTE — ED Provider Notes (Addendum)
[facility] Emergency Department Provider Note  ____________________________________________   I have reviewed the triage vital signs and the nursing notes.   HISTORY  Chief Complaint Fall and Loss of Consciousness    HPI Betty [name] is a 70 y.o. female who is recent started on flecainide by her cardiologist. This was on Friday. Since that time she's been feeling weak and lightheaded. Patient also has recently been treated for urinary tract infection has ongoing frequency but no dysuria. Denies any fever. She states that she is taking Macrobid. She was feeling somewhat nauseated, and lightheaded while walking. She states that she got very lightheaded and then her family states she passed out. She sat down on the ground on her bottom and then fell back hitting her head. She is on Coumadin. No seizure activity was noted. She did not have a postictal period. At this time, she has only complaint of feeling somewhat lightheaded. This is been going on since she was started on the flecainide.      Past Medical History  Diagnosis Date  . Headache(784.0)   . Backache, unspecified   . Herpes zoster without mention of complication   . Osteoarthrosis, unspecified whether generalized or localized, unspecified site   . Nontoxic uninodular goiter   . Obesity, unspecified   . Esophageal reflux   . Unspecified sleep apnea   . Atrial fibrillation ([place])   . Acute diastolic heart failure ([place])   . Cardiomegaly   . Hypertension     heart controlled w CHF  . Asthma   . Diabetes mellitus without complication ([place])   . Allergy   . Hx: UTI (urinary tract infection)   . Urine incontinence     hx of  . 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 COPD (chronic obstructive pulmonary disease) [facility])     Patient Active Problem List   Diagnosis Date Noted  . A-fib ([place]) 05/10/2016  . Dyspnea 05/10/2016  . Acute  diastolic CHF (congestive heart failure) ([place]) 05/10/2016  . Anemia 05/10/2016  . Anemia 05/10/2016  . Other specified abnormal immunological findings in serum 04/24/2016  . Sepsis ([place]) 04/01/2016  . Prolonged Q-T interval on ECG 03/13/2016  . Mitral stenosis 03/13/2016  . Essential hypertension, benign 03/13/2016  . Diastolic congestive heart failure ([place]) 03/13/2016  . Interstitial lung disease ([place]) 12/29/2015  . Chronic LBP 07/26/2015  . Essential (primary) hypertension 07/26/2015  . Idiopathic insomnia 07/26/2015  . Restless leg 07/26/2015  . Obesity (BMI 30-39.9) 01/07/2015  . Abnormal EKG 09/11/2014  . External nasal lesion 02/26/2014  . Abnormal liver function tests 01/28/2014  . Ankle pain, right 01/28/2014  . Arthralgia of ankle or foot 01/28/2014  . Urinary frequency 12/24/2013  . Hemorrhoids 12/24/2013  . Neck pain on right side 11/10/2013  . Vitamin D deficiency 09/09/2013  . Obstructive sleep apnea 05/30/2013  . Urinary incontinence 05/30/2013  . Diabetes ([place]) 05/30/2013  . History of colonic polyps 05/30/2013  . Controlled type 2 diabetes mellitus without complication ([place]) [id number]  . H/O deep venous thrombosis 05/05/2013  . Depression 04/13/2009  . THYROID NODULE 04/13/2009  . 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 GERD 04/13/2009  . OSTEOARTHRITIS 04/13/2009  . BACK PAIN 04/13/2009  . HEADACHE 04/13/2009  . Gastro-esophageal reflux disease without esophagitis 04/13/2009  . Mild episode of recurrent major depressive disorder ([place]) 04/13/2009  . 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 ATRIAL FIBRILLATION 03/28/2009  . DIASTOLIC HEART FAILURE, ACUTE 03/28/2009  . VENTRICULAR HYPERTROPHY, LEFT 03/28/2009    Past Surgical History  Procedure Laterality Date  . Hysterectomy w unilateral    . Oophorectomy    . Cholecystectomy    . Abdominal hysterectomy  1979    complete (for precancerous cells)  . Ablation   2011 & 2014    Current Outpatient Rx  Name  Route  Sig  Dispense  Refill  . albuterol (PROVENTIL HFA;VENTOLIN HFA) 108 (90 Base) MCG/ACT inhaler   Inhalation   Inhale 2 puffs into the lungs every 4 (four) hours as needed for wheezing or shortness of breath.   1 Inhaler   10   . azaTHIOprine (IMURAN) 50 MG tablet   Oral   Take 100 mg by mouth daily.          . Biotin 5000 MCG CAPS   Oral   Take 5,000 mcg by mouth daily.          . calcium-vitamin D (OSCAL WITH D) 500-200 MG-UNIT per tablet   Oral   Take 1 tablet by mouth daily.         Marland Kitchen diltiazem (CARDIZEM CD) 240 MG 24 hr capsule   Oral   Take 240 mg by mouth daily.         . furosemide (LASIX) 40 MG tablet   Oral   Take 40 mg by mouth daily.         Marland Kitchen gabapentin (NEURONTIN) 600 MG tablet   Oral   Take 600 mg by mouth 2 (two) times daily.         Marland Kitchen HYDROcodone-acetaminophen (NORCO/VICODIN) 5-325 MG tablet   Oral   Take 1 tablet by mouth every 8 (eight) hours as needed for moderate pain.         Marland Kitchen insulin NPH Human (HUMULIN N,NOVOLIN N) 100 UNIT/ML injection   Subcutaneous   Inject 6 Units into the skin daily before breakfast.          . losartan (COZAAR) 25 MG tablet   Oral   Take 1 tablet (25 mg total) by mouth daily.   30 tablet   0   . losartan (COZAAR) 50 MG tablet   Oral   Take 50 mg by mouth daily.         Marland Kitchen losartan (COZAAR) 50 MG tablet      TAKE 1 TABLET BY MOUTH DAILY   30 tablet   0   . metFORMIN (GLUCOPHAGE) 1000 MG tablet   Oral   Take 1,000 mg by mouth 3 (three) times daily.          . metoprolol tartrate (LOPRESSOR) 25 MG tablet   Oral   Take 1 tablet (25 mg total) by mouth 2 (two) times daily.   60 tablet   0   . oxybutynin (DITROPAN) 5 MG tablet   Oral   Take 5 mg by mouth 2 (two) times daily.         . pantoprazole (PROTONIX) 40 MG tablet   Oral   Take 40 mg by mouth daily.         . predniSONE (DELTASONE) 10 MG tablet   Oral   Take 30 mg by  mouth daily with breakfast.          . rOPINIRole (REQUIP) 3 MG tablet   Oral   Take 3 mg by mouth at bedtime.         Marland Kitchen  traZODone (DESYREL) 100 MG tablet   Oral   Take 200 mg by mouth at bedtime.         . triamterene-hydrochlorothiazide (MAXZIDE-25) 37.5-25 MG tablet   Oral   Take 1 tablet by mouth daily.         . Venlafaxine HCl 225 MG TB24   Oral   Take 225 mg by mouth daily.           Allergies Amiodarone  Family History  Problem Relation Age of Onset  . Heart attack Father   . Heart failure Father   . Arthritis Father   . Stroke Father   . Hypertension Father   . Coronary artery disease Brother   . Peripheral vascular disease Brother   . Arthritis Mother   . Colon cancer Mother     colon cancer  . Hypertension Mother   . Cancer Maternal Grandmother     colon cancer  . Arthritis Maternal Grandmother     Social History Social History  Substance Use Topics  . Smoking status: Former Smoker -- 0.50 packs/day for 15 years    Types: Cigarettes  . Smokeless tobacco: Never Used     Comment: Has a 20-pack-year history, qutting in 1970.   Marland Kitchen Alcohol Use: No    Review of Systems Constitutional: No fever/chills Eyes: No visual changes. ENT: No sore throat. No stiff neck no neck pain Cardiovascular: Denies chest pain. Respiratory: Denies shortness of breath. Gastrointestinal:   no vomiting.  No diarrhea.  No constipation. Genitourinary: Negative for dysuria. Musculoskeletal: Negative lower extremity swelling Skin: Negative for rash. Neurological: Negative for headaches, focal weakness or numbness. 10-point ROS otherwise negative.  ____________________________________________   PHYSICAL EXAM:  VITAL SIGNS: ED Triage Vitals  Enc Vitals Group     BP 06/25/16 2055 118/58 mmHg     Pulse Rate 06/25/16 2055 88     Resp 06/25/16 2055 16     Temp 06/25/16 2055 98.8 F (37.1 C)     Temp Source 06/25/16 2055 Oral     SpO2 06/25/16 2049 94 %      Weight 06/25/16 2055 244 lb (110.678 kg)     Height 06/25/16 2055 5\' 9"  (1.753 m)     Head Cir --      Peak Flow --      Pain Score 06/25/16 2056 6     Pain Loc --      Pain Edu? --      Excl. in [place]? --     Constitutional: Alert and oriented. Well appearing and in no acute distress. Eyes: Conjunctivae are normal. PERRL. EOMI. Head: Hematoma/abrasion to the occiput with no skull fracture noted, there is no laceration that I can repair bleeding controlled. Nose: No congestion/rhinnorhea. Mouth/Throat: Mucous membranes are moist.  Oropharynx non-erythematous. Neck: No stridor.   Nontender with no meningismus Cardiovascular: Normal rate, regular rhythm. Grossly normal heart sounds.  Good peripheral circulation. Respiratory: Normal respiratory effort.  No retractions. Lungs CTAB. Abdominal: Soft and nontender. No distention. No guarding no rebound Back:  There is no focal tenderness or step off there is no midline tenderness there are no lesions noted. there is no CVA tenderness Musculoskeletal: No lower extremity tenderness. No joint effusions, no DVT signs strong distal pulses no edema Neurologic:  Normal speech and language. No gross focal neurologic deficits are appreciated.  Skin:  Skin is warm, dry and intact. No rash noted. Psychiatric: Mood and affect are normal. Speech and behavior are normal.  ____________________________________________   LABS (all labs ordered are listed, but only abnormal results are displayed)  Labs Reviewed  BASIC METABOLIC PANEL - Abnormal; Notable for the following:    Potassium 3.2 (*)    Chloride 99 (*)    Glucose, Bld 141 (*)    Calcium 8.8 (*)    All other components within normal limits  CBC - Abnormal; Notable for the following:    Hemoglobin 9.4 (*)    HCT 29.9 (*)    MCV 78.6 (*)    MCH 24.7 (*)    MCHC 31.4 (*)    RDW 19.7 (*)    All other components within normal limits  PROTIME-INR - Abnormal; Notable for the following:     Prothrombin Time 37.6 (*)    All other components within normal limits  URINALYSIS COMPLETEWITH MICROSCOPIC ([facility] ONLY)  TROPONIN I   ____________________________________________  EKG  I personally interpreted any EKGs ordered by me or triage Normal sinus rhythm rate 72 bpm, left axis deviation, no acute ST elevation, prolonged QT interval noted, no acute ST elevation or depression ____________________________________________  RADIOLOGY  I reviewed any imaging ordered by me or triage that were performed during my shift and, if possible, patient and/or family made aware of any abnormal findings. ____________________________________________   PROCEDURES  Procedure(s) performed: None  Critical Care performed: None  ____________________________________________   INITIAL IMPRESSION / ASSESSMENT AND PLAN / ED COURSE  Pertinent labs & imaging results that were available during my care of the patient were reviewed by me and considered in my medical decision making (see chart for details).  Patient had a syncopal event and hit her head. CT at this time is reassuring neurologically intact. Blood pressure is reassuring. Patient's multiple different reasons to be syncopal, including new medication and ongoing urinary tract infection. We'll check basic blood work, we will monitor her closely.   ____________________________________________   FINAL CLINICAL IMPRESSION(S) / ED DIAGNOSES  Final diagnoses:  None      This chart was dictated using voice recognition software.  Despite best efforts to proofread,  errors can occur which can change meaning.     Schuyler Amor, MD 06/25/16 [id number]  Schuyler Amor, MD 06/25/16 2212

## 2016-06-25 NOTE — ED Notes (Signed)
Pt arrived to ED via EMS after a syncopal episode and fall this evening. Pt was walking and reports suddenly becoming light headed and passing out. Pt hit the back of her head during the fall and reports a 6/10 pain. Pt is currently on Coumadin. Pt reports she was placed on Flecainide (sodium Channel Blocker) this Friday for chronic lung and heart problems. Since beginning the medication pt states she has been increasingly weak and lightheaded. Pt called PCP who told pt to continue on medication. PT also began taking macrobid Friday to treat a UTI. Pt denies pain other than in head. Pt verbalized feeling nauseous as well and received 4mg  IV Zofran from EMS. Pt is alert and oriented x4.

## 2016-06-26 ENCOUNTER — Inpatient Hospital Stay
Admit: 2016-06-26 | Discharge: 2016-06-26 | Disposition: A | Discharge status: Home / Self Care | Payer: Medicare Other | Attending: Internal Medicine | Admitting: Internal Medicine

## 2016-06-26 ENCOUNTER — Observation Stay: Payer: Medicare Other

## 2016-06-26 LAB — CBC
HEMATOCRIT: 27.4 % — AB (ref 35.0–47.0)
HEMOGLOBIN: 8.9 g/dL — AB (ref 12.0–16.0)
MCH: 25.6 pg — ABNORMAL LOW (ref 26.0–34.0)
MCHC: 32.3 g/dL (ref 32.0–36.0)
MCV: 79.5 fL — ABNORMAL LOW (ref 80.0–100.0)
Platelets: 322 10*3/uL (ref 150–440)
RBC: 3.45 MIL/uL — ABNORMAL LOW (ref 3.80–5.20)
RDW: 20.4 % — AB (ref 11.5–14.5)
WBC: 10.4 10*3/uL (ref 3.6–11.0)

## 2016-06-26 LAB — URINALYSIS COMPLETE WITH MICROSCOPIC (ARMC ONLY)
BILIRUBIN URINE: NEGATIVE
Bacteria, UA: NONE SEEN
GLUCOSE, UA: NEGATIVE mg/dL
KETONES UR: NEGATIVE mg/dL
LEUKOCYTES UA: NEGATIVE
NITRITE: NEGATIVE
PH: 5 (ref 5.0–8.0)
Protein, ur: 100 mg/dL — AB
Specific Gravity, Urine: 1.021 (ref 1.005–1.030)

## 2016-06-26 LAB — BASIC METABOLIC PANEL
Anion gap: 7 (ref 5–15)
BUN: 20 mg/dL (ref 6–20)
CALCIUM: 8.2 mg/dL — AB (ref 8.9–10.3)
CHLORIDE: 99 mmol/L — AB (ref 101–111)
CO2: 32 mmol/L (ref 22–32)
Creatinine, Ser: 0.74 mg/dL (ref 0.44–1.00)
GFR calc Af Amer: >60 mL/min (ref >60)
GLUCOSE: 137 mg/dL — AB (ref 65–99)
Potassium: 3.6 mmol/L (ref 3.5–5.1)
Sodium: 138 mmol/L (ref 135–145)

## 2016-06-26 LAB — TROPONIN I
TROPONIN I: 0.04 ng/mL — AB (ref <0.03)
Troponin I: 0.04 ng/mL (ref <0.03)
Troponin I: 0.08 ng/mL (ref <0.03)

## 2016-06-26 LAB — PROTIME-INR
INR: 3.44
PROTHROMBIN TIME: 33.9 s — AB (ref 11.4–15.0)

## 2016-06-26 LAB — GLUCOSE, CAPILLARY
GLUCOSE-CAPILLARY: 235 mg/dL — AB (ref 65–99)
GLUCOSE-CAPILLARY: 129 mg/dL — AB (ref 65–99)
Glucose-Capillary: 124 mg/dL — ABNORMAL HIGH (ref 65–99)
Glucose-Capillary: 172 mg/dL — ABNORMAL HIGH (ref 65–99)
Glucose-Capillary: 141 mg/dL — ABNORMAL HIGH (ref 65–99)
Glucose-Capillary: 158 mg/dL — ABNORMAL HIGH (ref 65–99)

## 2016-06-26 LAB — HEMOGLOBIN A1C: HEMOGLOBIN A1C: 6.7 % — AB (ref 4.0–6.0)

## 2016-06-26 MED ORDER — ROPINIROLE HCL 1 MG PO TABS
3.0000 mg | ORAL_TABLET | Freq: Every day | ORAL | Status: DC
  Administered 2016-06-26 – 2016-06-27 (×3): 3 mg via ORAL
  Filled 2016-06-26 (×3): qty 3

## 2016-06-26 MED ORDER — HYDROCODONE-ACETAMINOPHEN 5-325 MG PO TABS
1.0000 | ORAL_TABLET | Freq: Four times a day (QID) | ORAL | Status: DC | PRN
  Administered 2016-06-26 – 2016-06-28 (×5): 2 via ORAL
  Filled 2016-06-26 (×5): qty 2

## 2016-06-26 MED ORDER — INSULIN ASPART 100 UNIT/ML ~~LOC~~ SOLN
0.0000 [IU] | Freq: Every day | SUBCUTANEOUS | Status: DC
  Administered 2016-06-26: 2 [IU] via SUBCUTANEOUS
  Administered 2016-06-27: 3 [IU] via SUBCUTANEOUS
  Filled 2016-06-26: qty 2
  Filled 2016-06-26: qty 3

## 2016-06-26 MED ORDER — INSULIN ASPART 100 UNIT/ML ~~LOC~~ SOLN
0.0000 [IU] | Freq: Three times a day (TID) | SUBCUTANEOUS | Status: DC
Start: 2016-06-26 — End: 2016-06-28
  Administered 2016-06-26: 1 [IU] via SUBCUTANEOUS
  Administered 2016-06-26: 2 [IU] via SUBCUTANEOUS
  Administered 2016-06-26: 1 [IU] via SUBCUTANEOUS
  Administered 2016-06-27: 2 [IU] via SUBCUTANEOUS
  Administered 2016-06-27: 3 [IU] via SUBCUTANEOUS
  Administered 2016-06-27: 9 [IU] via SUBCUTANEOUS
  Administered 2016-06-28: 5 [IU] via SUBCUTANEOUS
  Administered 2016-06-28: 2 [IU] via SUBCUTANEOUS
  Filled 2016-06-26: qty 2
  Filled 2016-06-26: qty 1
  Filled 2016-06-26: qty 9
  Filled 2016-06-26: qty 5
  Filled 2016-06-26: qty 1
  Filled 2016-06-26 (×3): qty 2
  Filled 2016-06-26: qty 3

## 2016-06-26 MED ORDER — DILTIAZEM HCL ER COATED BEADS 180 MG PO CP24
360.0000 mg | ORAL_CAPSULE | Freq: Every day | ORAL | Status: DC
  Administered 2016-06-26 – 2016-06-28 (×3): 360 mg via ORAL
  Filled 2016-06-26 (×3): qty 2

## 2016-06-26 MED ORDER — MORPHINE SULFATE (PF) 2 MG/ML IV SOLN
2.0000 mg | Freq: Once | INTRAVENOUS | Status: AC
  Administered 2016-06-26: 2 mg via INTRAVENOUS
  Filled 2016-06-26: qty 1

## 2016-06-26 MED ORDER — AZATHIOPRINE 50 MG PO TABS
100.0000 mg | ORAL_TABLET | Freq: Every day | ORAL | Status: DC
  Administered 2016-06-26 – 2016-06-28 (×3): 100 mg via ORAL
  Filled 2016-06-26 (×3): qty 2

## 2016-06-26 MED ORDER — POTASSIUM CHLORIDE CRYS ER 20 MEQ PO TBCR
40.0000 meq | EXTENDED_RELEASE_TABLET | Freq: Once | ORAL | Status: AC
  Administered 2016-06-26: 40 meq via ORAL
  Filled 2016-06-26: qty 2

## 2016-06-26 MED ORDER — ACETAMINOPHEN 325 MG PO TABS
650.0000 mg | ORAL_TABLET | Freq: Four times a day (QID) | ORAL | Status: DC | PRN
  Administered 2016-06-26: 650 mg via ORAL
  Filled 2016-06-26: qty 2

## 2016-06-26 MED ORDER — OXYBUTYNIN CHLORIDE 5 MG PO TABS
5.0000 mg | ORAL_TABLET | Freq: Two times a day (BID) | ORAL | Status: DC
  Administered 2016-06-26 – 2016-06-28 (×6): 5 mg via ORAL
  Filled 2016-06-26 (×6): qty 1

## 2016-06-26 MED ORDER — GABAPENTIN 600 MG PO TABS
600.0000 mg | ORAL_TABLET | Freq: Two times a day (BID) | ORAL | Status: DC
  Administered 2016-06-26 – 2016-06-28 (×6): 600 mg via ORAL
  Filled 2016-06-26 (×6): qty 1

## 2016-06-26 MED ORDER — FUROSEMIDE 10 MG/ML IJ SOLN
20.0000 mg | Freq: Once | INTRAMUSCULAR | Status: AC
  Administered 2016-06-26: 20 mg via INTRAVENOUS
  Filled 2016-06-26: qty 2

## 2016-06-26 MED ORDER — SODIUM CHLORIDE 0.9% FLUSH
3.0000 mL | Freq: Two times a day (BID) | INTRAVENOUS | Status: DC
  Administered 2016-06-26 – 2016-06-28 (×6): 3 mL via INTRAVENOUS

## 2016-06-26 MED ORDER — HYDROCODONE-ACETAMINOPHEN 5-325 MG PO TABS
1.0000 | ORAL_TABLET | Freq: Three times a day (TID) | ORAL | Status: DC | PRN
  Administered 2016-06-26 (×2): 1 via ORAL
  Filled 2016-06-26 (×2): qty 1

## 2016-06-26 MED ORDER — TRAZODONE HCL 100 MG PO TABS
200.0000 mg | ORAL_TABLET | Freq: Every day | ORAL | Status: DC
  Administered 2016-06-26 – 2016-06-27 (×2): 200 mg via ORAL
  Filled 2016-06-26 (×2): qty 2

## 2016-06-26 MED ORDER — MORPHINE SULFATE (PF) 2 MG/ML IV SOLN
1.0000 mg | INTRAVENOUS | Status: DC | PRN
  Administered 2016-06-26 – 2016-06-27 (×2): 2 mg via INTRAVENOUS
  Filled 2016-06-26 (×2): qty 1

## 2016-06-26 MED ORDER — ACETAMINOPHEN 650 MG RE SUPP: 650.0000 mg | Freq: Four times a day (QID) | RECTAL | Status: DC | PRN

## 2016-06-26 NOTE — Progress Notes (Addendum)
ANTICOAGULATION CONSULT NOTE - Initial Consult  Pharmacy Consult for Warfarin  Indication: atrial fibrillation  Allergies  Allergen Reactions  . Amiodarone Other (See Comments)    Pt states that this medication causes lung bleeding.      Patient Measurements: Height: 5\' 9"  (175.3 cm) Weight: 244 lb (110.678 kg) IBW/kg (Calculated) : 66.2 Heparin Dosing Weight:   Vital Signs: Temp: 98.8 F (37.1 C) (07/04 2055) Temp Source: Oral (07/04 2055) BP: 149/66 mmHg (07/05 0000) Pulse Rate: 58 (07/05 0000)  Labs:  Recent Labs  06/25/16 2049  HGB 9.4*  HCT 29.9*  PLT 373  LABPROT 37.6*  INR 3.94  CREATININE 0.85  TROPONINI 0.03*    Estimated Creatinine Clearance: 82.8 mL/min (by C-G formula based on Cr of 0.85).   Medical History: Past Medical History  Diagnosis Date  . Headache(784.0)   . Backache, unspecified   . Herpes zoster without mention of complication   . Osteoarthrosis, unspecified whether generalized or localized, unspecified site   . Nontoxic uninodular goiter   . Obesity, unspecified   . Esophageal reflux   . Unspecified sleep apnea   . Atrial fibrillation ([place])   . Acute diastolic heart failure ([place])   . Cardiomegaly   . Hypertension     heart controlled w CHF  . Asthma   . Diabetes mellitus without complication ([place])   . Allergy   . Hx: UTI (urinary tract infection)   . Urine incontinence     hx of  . 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 COPD (chronic obstructive pulmonary disease) ([facility])     Medications:  Prescriptions prior to admission  Medication Sig Dispense Refill Last Dose  . albuterol (PROVENTIL HFA;VENTOLIN HFA) 108 (90 Base) MCG/ACT inhaler Inhale 2 puffs into the lungs every 4 (four) hours as needed for wheezing or shortness of breath. 1 Inhaler 10 prn at prn  . azaTHIOprine (IMURAN) 50 MG tablet Take 100 mg by mouth daily.    06/25/2016 at Unknown time  . Biotin  5000 MCG CAPS Take 5,000 mcg by mouth daily.    06/25/2016 at Unknown time  . calcium-vitamin D (OSCAL WITH D) 500-200 MG-UNIT per tablet Take 1 tablet by mouth daily.   06/25/2016 at Unknown time  . diltiazem (CARDIZEM CD) 360 MG 24 hr capsule Take 360 mg by mouth daily.  4 06/25/2016 at Unknown time  . flecainide (TAMBOCOR) 100 MG tablet Take 100 mg by mouth 2 (two) times daily.  11 06/25/2016 at Unknown time  . furosemide (LASIX) 40 MG tablet Take 40 mg by mouth daily.   06/25/2016 at Unknown time  . gabapentin (NEURONTIN) 600 MG tablet Take 600 mg by mouth 2 (two) times daily.   06/25/2016 at Unknown time  . HYDROcodone-acetaminophen (NORCO/VICODIN) 5-325 MG tablet Take 1 tablet by mouth every 8 (eight) hours as needed for moderate pain.   prn at prn  . metFORMIN (GLUCOPHAGE) 1000 MG tablet Take 1,000 mg by mouth 2 (two) times daily with a meal.    06/25/2016 at Unknown time  . nitrofurantoin, macrocrystal-monohydrate, (MACROBID) 100 MG capsule Take 100 mg by mouth 2 (two) times daily.   06/25/2016 at Unknown time  . oxybutynin (DITROPAN) 5 MG tablet Take 5 mg by mouth 2 (two) times daily.   06/25/2016 at Unknown time  . pantoprazole (PROTONIX) 40 MG tablet Take 40 mg by mouth daily.   06/25/2016 at Unknown time  .  rOPINIRole (REQUIP) 3 MG tablet Take 3 mg by mouth at bedtime.   06/24/2016 at Unknown time  . traZODone (DESYREL) 100 MG tablet Take 200 mg by mouth at bedtime.   06/24/2016 at Unknown time  . triamterene-hydrochlorothiazide (MAXZIDE-25) 37.5-25 MG tablet Take 1 tablet by mouth daily.   06/25/2016 at Unknown time  . Venlafaxine HCl 225 MG TB24 Take 225 mg by mouth daily.   06/25/2016 at Unknown time  . warfarin (COUMADIN) 6 MG tablet Take 6 mg by mouth daily.   06/24/2016 at Unknown time    Assessment: 7/4 INR @ 21:00 = 3.94  7/5 INR @ 03:00 = 3.44  Pt was on warfarin 6 mg PO daily.   Goal of Therapy:  INR 2-3   Plan:  Will recheck INR on 7/06 with AM labs.   Will not resume warfarin until INR is  therapeutic.    Emmory Solivan D 06/26/2016,1:17 AM

## 2016-06-26 NOTE — Progress Notes (Signed)
Pt requesting her home dose of trazodone. MD Dr. Claria Dice notified, orders placed. Rachael Fee, RN

## 2016-06-26 NOTE — Progress Notes (Signed)
Pt requesting gabapentin and trazodone, MD Dr. Jannifer Franklin notified. Orders placed for gabapentin. RN will continue to monitor. Rachael Fee, RN

## 2016-06-26 NOTE — Consult Note (Signed)
[facility] Cardiology  CARDIOLOGY CONSULT NOTE  Patient ID: MAUDEEN KIMPEL MRN: [id number] DOB/AGE: 01-24-1946 70 y.o.  Admit date: 06/25/2016 Referring Physician Tressia Miners Primary Physician [place] Primary Cardiologist [facility] Reason for Consultation Syncope  HPI: 70 year old female referred for evaluation of syncope. Patient's had a history of paroxysmal atrial fibrillation, status post prior catheter ablation, with recurrent atrial fibrillation. The patient has also had a long-standing history of intolerance to multiple medications, including amiodarone, metoprolol, and Lanoxin. Patient currently is on diltiazem for rate control. The patient was recently evaluated by Dr. Mylinda Latina at [facility] for consideration of repeat catheter ablation. He suggested a trial of flecainide. However, the patient has had intolerance to flecainide, with current lightheadedness as evening, while leaving a local restaurant, the patient became very lightheaded and had brief loss of consciousness witnessed by her son. This episode lasted approximately 15-20 seconds. The patient was admitted to telemetry, where she has remained clinically hemodynamically stable, in sinus rhythm.  Review of systems complete and found to be negative unless listed above     Past Medical History  Diagnosis Date  . Headache(784.0)   . Backache, unspecified   . Herpes zoster without mention of complication   . Osteoarthrosis, unspecified whether generalized or localized, unspecified site   . Nontoxic uninodular goiter   . Obesity, unspecified   . Esophageal reflux   . Unspecified sleep apnea   . Atrial fibrillation ([place])   . Acute diastolic heart failure ([place])   . Cardiomegaly   . Hypertension     heart controlled w CHF  . Asthma   . Diabetes mellitus without complication ([place])   . Allergy   . Hx: UTI (urinary tract infection)   . Urine incontinence     hx of  . 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 Diffuse pulmonary alveolar  hemorrhage     Related to Cytoxan use  . COPD (chronic obstructive pulmonary disease) [facility])     Past Surgical History  Procedure Laterality Date  . Hysterectomy w unilateral    . Oophorectomy    . Cholecystectomy    . Abdominal hysterectomy  1979    complete (for precancerous cells)  . Ablation  2011 & 2014    Prescriptions prior to admission  Medication Sig Dispense Refill Last Dose  . albuterol (PROVENTIL HFA;VENTOLIN HFA) 108 (90 Base) MCG/ACT inhaler Inhale 2 puffs into the lungs every 4 (four) hours as needed for wheezing or shortness of breath. 1 Inhaler 10 prn at prn  . azaTHIOprine (IMURAN) 50 MG tablet Take 100 mg by mouth daily.    06/25/2016 at Unknown time  . Biotin 5000 MCG CAPS Take 5,000 mcg by mouth daily.    06/25/2016 at Unknown time  . calcium-vitamin D (OSCAL WITH D) 500-200 MG-UNIT per tablet Take 1 tablet by mouth daily.   06/25/2016 at Unknown time  . diltiazem (CARDIZEM CD) 360 MG 24 hr capsule Take 360 mg by mouth daily.  4 06/25/2016 at Unknown time  . flecainide (TAMBOCOR) 100 MG tablet Take 100 mg by mouth 2 (two) times daily.  11 06/25/2016 at Unknown time  . furosemide (LASIX) 40 MG tablet Take 40 mg by mouth daily.   06/25/2016 at Unknown time  . gabapentin (NEURONTIN) 600 MG tablet Take 600 mg by mouth 2 (two) times daily.   06/25/2016 at Unknown time  . HYDROcodone-acetaminophen (NORCO/VICODIN) 5-325 MG tablet Take 1 tablet by mouth every 8 (eight) hours as needed for moderate pain.   prn at  prn  . metFORMIN (GLUCOPHAGE) 1000 MG tablet Take 1,000 mg by mouth 2 (two) times daily with a meal.    06/25/2016 at Unknown time  . nitrofurantoin, macrocrystal-monohydrate, (MACROBID) 100 MG capsule Take 100 mg by mouth 2 (two) times daily.   06/25/2016 at Unknown time  . oxybutynin (DITROPAN) 5 MG tablet Take 5 mg by mouth 2 (two) times daily.   06/25/2016 at Unknown time  . pantoprazole (PROTONIX) 40 MG tablet Take 40 mg by mouth daily.   06/25/2016 at Unknown time  . rOPINIRole  (REQUIP) 3 MG tablet Take 3 mg by mouth at bedtime.   06/24/2016 at Unknown time  . traZODone (DESYREL) 100 MG tablet Take 200 mg by mouth at bedtime.   06/24/2016 at Unknown time  . triamterene-hydrochlorothiazide (MAXZIDE-25) 37.5-25 MG tablet Take 1 tablet by mouth daily.   06/25/2016 at Unknown time  . Venlafaxine HCl 225 MG TB24 Take 225 mg by mouth daily.   06/25/2016 at Unknown time  . warfarin (COUMADIN) 6 MG tablet Take 6 mg by mouth daily.   06/24/2016 at Unknown time   Social History   Social History  . Marital Status: Widowed    Spouse Name: N/A  . Number of Children: 1  . Years of Education: N/A   Occupational History  .     Social History Main Topics  . Smoking status: Former Smoker -- 0.50 packs/day for 15 years    Types: Cigarettes  . Smokeless tobacco: Never Used     Comment: Has a 20-pack-year history, qutting in 1970.   Marland Kitchen Alcohol Use: No  . Drug Use: No  . Sexual Activity: Not Currently   Other Topics Concern  . Not on file   Social History Narrative   Lives in [place] with her husband. 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 Does not routinely exercise.     Family History  Problem Relation Age of Onset  . Heart attack Father   . Heart failure Father   . Arthritis Father   . Stroke Father   . Hypertension Father   . Coronary artery disease Brother   . Peripheral vascular disease Brother   . Arthritis Mother   . Colon cancer Mother     colon cancer  . Hypertension Mother   . Cancer Maternal Grandmother     colon cancer  . Arthritis Maternal Grandmother       Review of systems complete and found to be negative unless listed above      PHYSICAL EXAM  General: Well developed, well nourished, in no acute distress HEENT:  Normocephalic and atramatic Neck:  No JVD.  Lungs: Clear bilaterally to auscultation and percussion. Heart: HRRR . Normal S1 and S2 without gallops or murmurs.  Abdomen: Bowel sounds are  positive, abdomen soft and non-tender  Msk:  Back normal, normal gait. Normal strength and tone for age. Extremities: No clubbing, cyanosis or edema.   Neuro: Alert and oriented X 3. Psych:  Good affect, responds appropriately  Labs:   Lab Results  Component Value Date   WBC 10.4 06/26/2016   HGB 8.9* 06/26/2016   HCT 27.4* 06/26/2016   MCV 79.5* 06/26/2016   PLT 322 06/26/2016    Recent Labs Lab 06/26/16 0700  NA 138  K 3.6  CL 99*  CO2 32  BUN 20  CREATININE 0.74  CALCIUM 8.2*  GLUCOSE 137*   Lab Results  Component Value Date  CKTOTAL 46 04/11/2009   CKMB 1.6 04/11/2009   TROPONINI 0.08* 06/26/2016    Lab Results  Component Value Date   CHOL 153 03/07/2015   CHOL 150 08/16/2014   Lab Results  Component Value Date   HDL 36.60* 03/07/2015   HDL 36.30* 08/16/2014   Lab Results  Component Value Date   LDLCALC 90 03/07/2015   LDLCALC 97 08/16/2014   Lab Results  Component Value Date   TRIG 130.0 03/07/2015   TRIG 85.0 08/16/2014   Lab Results  Component Value Date   CHOLHDL 4 03/07/2015   CHOLHDL 4 08/16/2014   No results found for: LDLDIRECT    Radiology: Dg Chest 2 View  06/26/2016  CLINICAL DATA:  Evaluation of hypoxia. Patient states that she fell last night and hit her head. Patient also states she has had a recent change in her medications. EXAM: CHEST  2 VIEW COMPARISON:  05/10/2016 FINDINGS: Study somewhat limited by positioning and relatively low lung volumes. Cardiac silhouette is mildly enlarged. There is prominence of the left hilum that is likely vascular. No convincing mediastinal or hilar masses. There is vascular congestion. The interstitial markings are prominent but similar to the prior study. There is no focal consolidation to suggest pneumonia. No pleural effusion or pneumothorax. Bony thorax is demineralized but grossly intact. IMPRESSION: 1. Mild cardiomegaly and central vascular congestion. A mild degree of congestive heart failure is  suspected. 2. No evidence of pneumonia. Electronically Signed   By: Lajean Manes M.D.   On: 06/26/2016 13:09   Ct Head Wo Contrast  06/25/2016  CLINICAL DATA:  Closed head injury. Syncope and fall this evening. Struck back of head during fall. On anticoagulation. EXAM: CT HEAD WITHOUT CONTRAST CT CERVICAL SPINE WITHOUT CONTRAST TECHNIQUE: Multidetector CT imaging of the head and cervical spine was performed following the standard protocol without intravenous contrast. Multiplanar CT image reconstructions of the cervical spine were also generated. COMPARISON:  None. FINDINGS: CT HEAD FINDINGS No intracranial hemorrhage, mass effect, or midline shift. No hydrocephalus. The basilar cisterns are patent. No evidence of territorial infarct. Lacunar infarct in the right basal ganglia appears remote. Scattered calcifications in the basal ganglia. No intracranial fluid collection. Left parietal scalp hematoma without subjacent fracture. Calvarium is intact. Included paranasal sinuses and mastoid air cells are well aerated. CT CERVICAL SPINE FINDINGS No fracture or acute subluxation. The dens is intact. There are no jumped or perched facets. None fusion posterior elements of C1, a normal variant. Disc space narrowing at C5-C6, C6-C7 and C7-T1 with endplate spurring. There is multilevel facet arthropathy. Sclerosis of the facets at C3-C4 on the right likely secondary to degenerative change. No prevertebral soft tissue edema. Ground-glass opacities in the lung apices, previously assessed on high-resolution chest CT. IMPRESSION: 1. Left parietal scalp hematoma without skull fracture or acute intracranial abnormality. 2. 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 Electronically Signed   By: Jeb Levering M.D.   On: 06/25/2016 21:41   Ct Cervical Spine Wo Contrast  06/25/2016  CLINICAL DATA:  Closed head injury. Syncope and fall this evening. Struck back of head during fall.  On anticoagulation. EXAM: CT HEAD WITHOUT CONTRAST CT CERVICAL SPINE WITHOUT CONTRAST TECHNIQUE: Multidetector CT imaging of the head and cervical spine was performed following the standard protocol without intravenous contrast. Multiplanar CT image reconstructions of the cervical spine were also generated. COMPARISON:  None. FINDINGS: CT HEAD FINDINGS No intracranial hemorrhage, mass effect, or midline shift. No  hydrocephalus. The basilar cisterns are patent. No evidence of territorial infarct. Lacunar infarct in the right basal ganglia appears remote. Scattered calcifications in the basal ganglia. No intracranial fluid collection. Left parietal scalp hematoma without subjacent fracture. Calvarium is intact. Included paranasal sinuses and mastoid air cells are well aerated. CT CERVICAL SPINE FINDINGS No fracture or acute subluxation. The dens is intact. There are no jumped or perched facets. None fusion posterior elements of C1, a normal variant. Disc space narrowing at C5-C6, C6-C7 and C7-T1 with endplate spurring. There is multilevel facet arthropathy. Sclerosis of the facets at C3-C4 on the right likely secondary to degenerative change. No prevertebral soft tissue edema. Ground-glass opacities in the lung apices, previously assessed on high-resolution chest CT. IMPRESSION: 1. Left parietal scalp hematoma without skull fracture or acute intracranial abnormality. 2. 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 Electronically Signed   By: Jeb Levering M.D.   On: 06/25/2016 21:41   US Carotid Bilateral  06/26/2016  CLINICAL DATA:  Syncope. EXAM: BILATERAL CAROTID DUPLEX ULTRASOUND TECHNIQUE: Pearline Cables scale imaging, color Doppler and duplex ultrasound were performed of bilateral carotid and vertebral arteries in the neck. COMPARISON:  CT 06/25/2016.  CT 11/10/2013. FINDINGS: Criteria: Quantification of carotid stenosis is based on velocity parameters that correlate the  residual internal carotid diameter with NASCET-based stenosis levels, using the diameter of the distal internal carotid lumen as the denominator for stenosis measurement. The following velocity measurements were obtained: RIGHT ICA:  309/73 cm/sec CCA:  0000000 cm/sec SYSTOLIC ICA/CCA RATIO:  2.7 DIASTOLIC ICA/CCA RATIO:  2.3 ECA:  185 cm/sec LEFT ICA:  217/71 cm/sec CCA:  [id number] cm/sec SYSTOLIC ICA/CCA RATIO:  1.7 DIASTOLIC ICA/CCA RATIO:  2.1 ECA:  260 cm/sec RIGHT CAROTID ARTERY: Moderate right carotid bifurcation proximal ICA plaque degree of stenosis in the 50-69% range using flow velocity ratios and color flow evaluation. RIGHT VERTEBRAL ARTERY:  Patent with antegrade flow. LEFT CAROTID ARTERY: Moderate left carotid bifurcation and proximal ICA plaque with degree of stenosis in the 50-69% range using flow velocities and color flow evaluation. LEFT VERTEBRAL ARTERY:  Patent antegrade flow. Exam limited due to patient's mobility and difficulty cooperating. IMPRESSION: 1. 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 2. Vertebral arteries are patent antegrade flow. Electronically Signed   By: Marcello Moores  Register   On: 06/26/2016 12:41    EKG: Sinus rhythm  ASSESSMENT AND PLAN:   1. Syncope, apparent intolerance to flecainide, currently in sinus rhythm 2. Paroxysmal atrial fibrillation, status post prior catheter ablation, with recurrent atrial fibrillation, being considered for possible redo catheter ablation with multiple intolerances to medications including amiodarone, metoprolol, and Lanoxin  Recommendations  1. DC flecainide 2. Continue long-acting Cardizem for rate control 3. If patient remains hemodynamically stable, consider discharge, for reevaluation with Dr. Mylinda Latina as outpatient to consider repeat catheter ablation, and/or, AV node ablation with pacemaker  implantation  Signed: Paxton Kanaan MD,PhD, [facility] 06/26/2016, 5:22 PM

## 2016-06-26 NOTE — Progress Notes (Signed)
Pt admitted to room 254. A&Ox4, VSS, no complaints at this time. Pt oriented to room and unit, and educated on use of call bell, telephone, and safety. Safety contract signed. Skin assessed and telemetry verified with Yasmin, RN. RN will continue to monitor and treat per MD orders. Rachael Fee, RN

## 2016-06-26 NOTE — Progress Notes (Signed)
[facility] NAME: Betty [name]    MR#:  [id number]  DATE OF BIRTH:  1946/04/20  SUBJECTIVE:  CHIEF COMPLAINT:   Chief Complaint  Patient presents with  . Fall  . Loss of Consciousness   - Still complains of lightheadedness and fatigue - has a headache. Bleeding from scalp laceration after the fall has stopped  REVIEW OF SYSTEMS:  Review of Systems  Constitutional: Positive for malaise/fatigue. Negative for fever and chills.  HENT: Negative for ear discharge, ear pain and nosebleeds.   Eyes: Negative for blurred vision and double vision.  Respiratory: Negative for cough, shortness of breath and wheezing.   Cardiovascular: Negative for chest pain, palpitations and leg swelling.  Gastrointestinal: Negative for nausea, vomiting, abdominal pain, diarrhea and constipation.  Genitourinary: Negative for dysuria.  Musculoskeletal: Negative for myalgias.  Neurological: Positive for dizziness. Negative for seizures and headaches.    DRUG ALLERGIES:   Allergies  Allergen Reactions  . Amiodarone Other (See Comments)    Pt states that this medication causes lung bleeding.      VITALS:  Blood pressure 137/74, pulse 56, temperature 98.5 F (36.9 C), temperature source Oral, resp. rate 16, height 5\' 9"  (1.753 m), weight 111.766 kg (246 lb 6.4 oz), SpO2 94 %.  PHYSICAL EXAMINATION:  Physical Exam  GENERAL:  70 y.o.-year-old patient lying in the bed with no acute distress.  EYES: Pupils equal, round, reactive to light and accommodation. No scleral icterus. Extraocular muscles intact.  HEENT: Head atraumatic, normocephalic. Oropharynx and nasopharynx clear. Left parietal scalp laceration- no bleeding NECK:  Supple, no jugular venous distention. No thyroid enlargement, no tenderness.  LUNGS: Normal breath sounds bilaterally, no wheezing, rhonchi or crepitation. No use of accessory muscles of respiration. Bibasilar crackles heard,  worse at the right base.  CARDIOVASCULAR: S1, S2 irregular. No murmurs, rubs, or gallops.  ABDOMEN: Soft, nontender, nondistended. Bowel sounds present. No organomegaly or mass.  EXTREMITIES: No pedal edema, cyanosis, or clubbing.  NEUROLOGIC: Cranial nerves II through XII are intact. Muscle strength 5/5 in all extremities. Sensation intact. Gait not checked.  PSYCHIATRIC: The patient is alert and oriented x 3.  SKIN: No obvious rash, lesion, or ulcer.    LABORATORY PANEL:   CBC  Recent Labs Lab 06/26/16 0700  WBC 10.4  HGB 8.9*  HCT 27.4*  PLT 322   ------------------------------------------------------------------------------------------------------------------  Chemistries   Recent Labs Lab 06/26/16 0700  NA 138  K 3.6  CL 99*  CO2 32  GLUCOSE 137*  BUN 20  CREATININE 0.74  CALCIUM 8.2*   ------------------------------------------------------------------------------------------------------------------  Cardiac Enzymes  Recent Labs Lab 06/26/16 1256  TROPONINI 0.08*   ------------------------------------------------------------------------------------------------------------------  RADIOLOGY:  Dg Chest 2 View  06/26/2016  CLINICAL DATA:  Evaluation of hypoxia. Patient states that she fell last night and hit her head. Patient also states she has had a recent change in her medications. EXAM: CHEST  2 VIEW COMPARISON:  05/10/2016 FINDINGS: Study somewhat limited by positioning and relatively low lung volumes. Cardiac silhouette is mildly enlarged. There is prominence of the left hilum that is likely vascular. No convincing mediastinal or hilar masses. There is vascular congestion. The interstitial markings are prominent but similar to the prior study. There is no focal consolidation to suggest pneumonia. No pleural effusion or pneumothorax. Bony thorax is demineralized but grossly intact. IMPRESSION: 1. Mild cardiomegaly and central vascular congestion. A mild degree  of congestive heart failure is suspected. 2. No evidence  of pneumonia. Electronically Signed   By: Lajean Manes M.D.   On: 06/26/2016 13:09   Ct Head Wo Contrast  06/25/2016  CLINICAL DATA:  Closed head injury. Syncope and fall this evening. Struck back of head during fall. On anticoagulation. EXAM: CT HEAD WITHOUT CONTRAST CT CERVICAL SPINE WITHOUT CONTRAST TECHNIQUE: Multidetector CT imaging of the head and cervical spine was performed following the standard protocol without intravenous contrast. Multiplanar CT image reconstructions of the cervical spine were also generated. COMPARISON:  None. FINDINGS: CT HEAD FINDINGS No intracranial hemorrhage, mass effect, or midline shift. No hydrocephalus. The basilar cisterns are patent. No evidence of territorial infarct. Lacunar infarct in the right basal ganglia appears remote. Scattered calcifications in the basal ganglia. No intracranial fluid collection. Left parietal scalp hematoma without subjacent fracture. Calvarium is intact. Included paranasal sinuses and mastoid air cells are well aerated. CT CERVICAL SPINE FINDINGS No fracture or acute subluxation. The dens is intact. There are no jumped or perched facets. None fusion posterior elements of C1, a normal variant. Disc space narrowing at C5-C6, C6-C7 and C7-T1 with endplate spurring. There is multilevel facet arthropathy. Sclerosis of the facets at C3-C4 on the right likely secondary to degenerative change. No prevertebral soft tissue edema. Ground-glass opacities in the lung apices, previously assessed on high-resolution chest CT. IMPRESSION: 1. Left parietal scalp hematoma without skull fracture or acute intracranial abnormality. 2. 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 Electronically Signed   By: Jeb Levering M.D.   On: 06/25/2016 21:41   Ct Cervical Spine Wo Contrast  06/25/2016  CLINICAL DATA:  Closed head injury. Syncope and fall this evening.  Struck back of head during fall. On anticoagulation. EXAM: CT HEAD WITHOUT CONTRAST CT CERVICAL SPINE WITHOUT CONTRAST TECHNIQUE: Multidetector CT imaging of the head and cervical spine was performed following the standard protocol without intravenous contrast. Multiplanar CT image reconstructions of the cervical spine were also generated. COMPARISON:  None. FINDINGS: CT HEAD FINDINGS No intracranial hemorrhage, mass effect, or midline shift. No hydrocephalus. The basilar cisterns are patent. No evidence of territorial infarct. Lacunar infarct in the right basal ganglia appears remote. Scattered calcifications in the basal ganglia. No intracranial fluid collection. Left parietal scalp hematoma without subjacent fracture. Calvarium is intact. Included paranasal sinuses and mastoid air cells are well aerated. CT CERVICAL SPINE FINDINGS No fracture or acute subluxation. The dens is intact. There are no jumped or perched facets. None fusion posterior elements of C1, a normal variant. Disc space narrowing at C5-C6, C6-C7 and C7-T1 with endplate spurring. There is multilevel facet arthropathy. Sclerosis of the facets at C3-C4 on the right likely secondary to degenerative change. No prevertebral soft tissue edema. Ground-glass opacities in the lung apices, previously assessed on high-resolution chest CT. IMPRESSION: 1. Left parietal scalp hematoma without skull fracture or acute intracranial abnormality. 2. 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 Electronically Signed   By: Jeb Levering M.D.   On: 06/25/2016 21:41   US Carotid Bilateral  06/26/2016  CLINICAL DATA:  Syncope. EXAM: BILATERAL CAROTID DUPLEX ULTRASOUND TECHNIQUE: Pearline Cables scale imaging, color Doppler and duplex ultrasound were performed of bilateral carotid and vertebral arteries in the neck. COMPARISON:  CT 06/25/2016.  CT 11/10/2013. FINDINGS: Criteria: Quantification of carotid stenosis is based on  velocity parameters that correlate the residual internal carotid diameter with NASCET-based stenosis levels, using the diameter of the distal internal carotid lumen as the denominator for stenosis measurement.  The following velocity measurements were obtained: RIGHT ICA:  309/73 cm/sec CCA:  0000000 cm/sec SYSTOLIC ICA/CCA RATIO:  2.7 DIASTOLIC ICA/CCA RATIO:  2.3 ECA:  185 cm/sec LEFT ICA:  217/71 cm/sec CCA:  [id number] cm/sec SYSTOLIC ICA/CCA RATIO:  1.7 DIASTOLIC ICA/CCA RATIO:  2.1 ECA:  260 cm/sec RIGHT CAROTID ARTERY: Moderate right carotid bifurcation proximal ICA plaque degree of stenosis in the 50-69% range using flow velocity ratios and color flow evaluation. RIGHT VERTEBRAL ARTERY:  Patent with antegrade flow. LEFT CAROTID ARTERY: Moderate left carotid bifurcation and proximal ICA plaque with degree of stenosis in the 50-69% range using flow velocities and color flow evaluation. LEFT VERTEBRAL ARTERY:  Patent antegrade flow. Exam limited due to patient's mobility and difficulty cooperating. IMPRESSION: 1. 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 2. Vertebral arteries are patent antegrade flow. Electronically Signed   By: Marcello Moores  Register   On: 06/26/2016 12:41    EKG:   Orders placed or performed during the hospital encounter of 06/25/16  . ED EKG  . 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  #1 syncope-could be secondary to side effect from flecainide. -Patient recently started on flecainide and has been experiencing lightheadedness. -Could currently flecainide is on hold. Vascular Dopplers and echocardiogram has been ordered. -Continue to monitor on telemetry.  #2 Acute on  chronic hypoxia-patient on 2 L home oxygen for her ILD for the last 2 months. Recent CT chest about a month ago has shown improvement in her pulmonary alveolar hemorrhage. - Continue to wean oxygen. Chest x-ray with mild pulmonary edema -One dose of IV Lasix given. -Follow up repeat echocardiogram. -Patient has underlying ILD and vasculitis. Currently on Imuran.  #3 atrial fibrillation-chronic A. fib status post prior ablation. Recently started on flecainide with side effects. -Currently flecainide is on hold. Rate controlled. Might need repeat ablation. -Cardiology consulted. On Cardizem for rate control. -Warfarin on hold due to fall and head bleed.  #3 Elevated troponin- slight bump- demand ischemia, not acute MI  #4 neuropathy-continue gabapentin.  #5 hypokalemia-being replaced  #6 hypertension-meds on hold.  #7 DVT prophylaxis-was on warfarin as outpatient. INR at 3.4.  Physical therapy consult   All the records are reviewed and case discussed with Care Management/Social Workerr. Management plans discussed with the patient, family and they are in agreement.  CODE STATUS: Full Code  TOTAL TIME TAKING CARE OF THIS PATIENT: 37 minutes.   POSSIBLE D/C IN 1-2 DAYS, DEPENDING ON CLINICAL CONDITION.   Derrisha Foos M.D on 06/26/2016 at 3:26 PM  Between 7am to 6pm - Pager - [phone]  After 6pm go to [email or web address] - password [email or web address] [facility]  Office  [phone]  CC: Primary care physician; Dion Body, MD

## 2016-06-26 NOTE — Progress Notes (Signed)
On morning vitals, pt was found with O2 sats 75% and complaining of feeling clammy, CBG 129. O2 increased to 3L/min, sats improved to 88%. O2 increased to 4L/min, sats improved to 93%. Pt states she is feeling much better. RN will continue to monitor. Rachael Fee, RN

## 2016-06-26 NOTE — ED Notes (Signed)
Admitting MD at bedside.

## 2016-06-26 NOTE — Progress Notes (Signed)
CRITICAL VALUE ALERT  Critical value received:  Troponin 0.08   Date of notification:  06/26/2016  Time of notification:  3:25 PM  Critical value read back:Yes.    Nurse who received alert:  Maddie   MD notified (1st page):  Dr. Tressia Miners  Time of first page:  1520  MD notified (2nd page):  Time of second page:  Responding MD:  Dr. Tressia Miners  Time MD responded:  1525  Dr. Saralyn Pilar is consulted and will be notified when he rounds on patient. No further orders, pt reports no chest discomfort/pressure/pain. Will continue to monitor.

## 2016-06-26 NOTE — Care Management (Signed)
Patient placed in observation after syncope episode.  Started on flecainide several days prior by her cardiologist.  Shawn Stall light headed since but was instructed to continue taking the medication.  She is on 4 liters 02 at present but  2 liters chronic 02 in the home. Independent in all adls, denies issues accessing medical care, obtaining medications or with transportation.  Current with her PCP.  No discharge needs identified at present by care manager or members of care team.  Discussed the need to wean 02 during progression

## 2016-06-26 NOTE — H&P (Signed)
[facility] NAME: Betty [name]    MR#:  [id number]  DATE OF BIRTH:  1946-01-28  DATE OF ADMISSION:  06/25/2016  PRIMARY CARE PHYSICIAN: Dion Body, MD   REQUESTING/REFERRING PHYSICIAN: Burlene Arnt, MD  CHIEF COMPLAINT:   Chief Complaint  Patient presents with  . Fall  . Loss of Consciousness    HISTORY OF PRESENT ILLNESS:  Betty [name]  is a 70 y.o. female who presents with Syncopal episode. Patient was started on a new medicine, flecainide, 4-5 days ago. Since that time she has been persistently lightheaded. She spoke with the cardiologist who prescribed this medication for, and states that he told her he wanted her to continue taking it so that her body could adjust to it. However, tonight she had syncopal episode and a fall just after a spell of lightheadedness. Imaging in the ED showed no significant injury from her fall, however it was felt that she should be observed and worked up for her syncopal episode. Hospitalists were called for admission.  PAST MEDICAL HISTORY:   Past Medical History  Diagnosis Date  . Headache(784.0)   . Backache, unspecified   . Herpes zoster without mention of complication   . Osteoarthrosis, unspecified whether generalized or localized, unspecified site   . Nontoxic uninodular goiter   . Obesity, unspecified   . Esophageal reflux   . Unspecified sleep apnea   . Atrial fibrillation ([place])   . Acute diastolic heart failure ([place])   . Cardiomegaly   . Hypertension     heart controlled w CHF  . Asthma   . Diabetes mellitus without complication ([place])   . Allergy   . Hx: UTI (urinary tract infection)   . Urine incontinence     hx of  . 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 COPD (chronic obstructive pulmonary disease) ([place])     PAST SURGICAL HISTORY:   Past Surgical History  Procedure Laterality Date  .  Hysterectomy w unilateral    . Oophorectomy    . Cholecystectomy    . Abdominal hysterectomy  1979    complete (for precancerous cells)  . Ablation  2011 & 2014    SOCIAL HISTORY:   Social History  Substance Use Topics  . Smoking status: Former Smoker -- 0.50 packs/day for 15 years    Types: Cigarettes  . Smokeless tobacco: Never Used     Comment: Has a 20-pack-year history, qutting in 1970.   Marland Kitchen Alcohol Use: No    FAMILY HISTORY:   Family History  Problem Relation Age of Onset  . Heart attack Father   . Heart failure Father   . Arthritis Father   . Stroke Father   . Hypertension Father   . Coronary artery disease Brother   . Peripheral vascular disease Brother   . Arthritis Mother   . Colon cancer Mother     colon cancer  . Hypertension Mother   . Cancer Maternal Grandmother     colon cancer  . Arthritis Maternal Grandmother     DRUG ALLERGIES:   Allergies  Allergen Reactions  . Amiodarone Other (See Comments)    Pt states that this medication causes lung bleeding.      MEDICATIONS AT HOME:   Prior to Admission medications   Medication Sig Start Date End Date Taking? Authorizing Provider  albuterol (PROVENTIL HFA;VENTOLIN HFA) 108 (90 Base)  MCG/ACT inhaler Inhale 2 puffs into the lungs every 4 (four) hours as needed for wheezing or shortness of breath. 01/17/16  Yes Flora Lipps, MD  azaTHIOprine (IMURAN) 50 MG tablet Take 100 mg by mouth daily.    Yes Historical Provider, MD  Biotin 5000 MCG CAPS Take 5,000 mcg by mouth daily.    Yes Historical Provider, MD  calcium-vitamin D (OSCAL WITH D) 500-200 MG-UNIT per tablet Take 1 tablet by mouth daily.   Yes Historical Provider, MD  diltiazem (CARDIZEM CD) 360 MG 24 hr capsule Take 360 mg by mouth daily. 06/17/16  Yes Historical Provider, MD  flecainide (TAMBOCOR) 100 MG tablet Take 100 mg by mouth 2 (two) times daily. 06/21/16  Yes Historical Provider, MD  furosemide (LASIX) 40 MG tablet Take 40 mg by mouth daily.    Yes Historical Provider, MD  gabapentin (NEURONTIN) 600 MG tablet Take 600 mg by mouth 2 (two) times daily.   Yes Historical Provider, MD  HYDROcodone-acetaminophen (NORCO/VICODIN) 5-325 MG tablet Take 1 tablet by mouth every 8 (eight) hours as needed for moderate pain.   Yes Historical Provider, MD  metFORMIN (GLUCOPHAGE) 1000 MG tablet Take 1,000 mg by mouth 2 (two) times daily with a meal.    Yes Historical Provider, MD  nitrofurantoin, macrocrystal-monohydrate, (MACROBID) 100 MG capsule Take 100 mg by mouth 2 (two) times daily. 06/24/16  Yes Historical Provider, MD  oxybutynin (DITROPAN) 5 MG tablet Take 5 mg by mouth 2 (two) times daily.   Yes Historical Provider, MD  pantoprazole (PROTONIX) 40 MG tablet Take 40 mg by mouth daily.   Yes Historical Provider, MD  rOPINIRole (REQUIP) 3 MG tablet Take 3 mg by mouth at bedtime.   Yes Historical Provider, MD  traZODone (DESYREL) 100 MG tablet Take 200 mg by mouth at bedtime.   Yes Historical Provider, MD  triamterene-hydrochlorothiazide (MAXZIDE-25) 37.5-25 MG tablet Take 1 tablet by mouth daily.   Yes Historical Provider, MD  Venlafaxine HCl 225 MG TB24 Take 225 mg by mouth daily.   Yes Historical Provider, MD  warfarin (COUMADIN) 6 MG tablet Take 6 mg by mouth daily.   Yes Historical Provider, MD    REVIEW OF SYSTEMS:  Review of Systems  Constitutional: Negative for fever, chills, weight loss and malaise/fatigue.  HENT: Negative for ear pain, hearing loss and tinnitus.   Eyes: Negative for blurred vision, double vision, pain and redness.  Respiratory: Negative for cough, hemoptysis and shortness of breath.   Cardiovascular: Negative for chest pain, palpitations, orthopnea and leg swelling.       Lightheadedness  Gastrointestinal: Negative for nausea, vomiting, abdominal pain, diarrhea and constipation.  Genitourinary: Negative for dysuria, frequency and hematuria.  Musculoskeletal: Positive for falls. Negative for back pain, joint pain and  neck pain.  Skin:       No acne, rash, or lesions  Neurological: Positive for loss of consciousness (syncope). Negative for dizziness, tremors, focal weakness and weakness.  Endo/Heme/Allergies: Negative for polydipsia. Does not bruise/bleed easily.  Psychiatric/Behavioral: Negative for depression. The patient is not nervous/anxious and does not have insomnia.      VITAL SIGNS:   Filed Vitals:   06/25/16 2049 06/25/16 2055 06/25/16 2100 06/25/16 2230  BP:  118/58 121/65 115/60  Pulse:  88 61 80  Temp:  98.8 F (37.1 C)    TempSrc:  Oral    Resp:  16 25 19   Height:  5\' 9"  (1.753 m)    Weight:  110.678 kg (244 lb)  SpO2: 94% 94% 91% 95%   Wt Readings from Last 3 Encounters:  06/25/16 110.678 kg (244 lb)  05/10/16 108.41 kg (239 lb)  04/30/16 103.103 kg (227 lb 4.8 oz)    PHYSICAL EXAMINATION:  Physical Exam  Vitals reviewed. Constitutional: She is oriented to person, place, and time. She appears well-developed and well-nourished. No distress.  HENT:  Head: Normocephalic and atraumatic.  Mouth/Throat: Oropharynx is clear and moist.  Eyes: Conjunctivae and EOM are normal. Pupils are equal, round, and reactive to light. No scleral icterus.  Neck: Normal range of motion. Neck supple. No JVD present. No thyromegaly present.  Cardiovascular: Normal rate, regular rhythm and intact distal pulses.  Exam reveals no gallop and no friction rub.   No murmur heard. Respiratory: Effort normal and breath sounds normal. No respiratory distress. She has no wheezes. She has no rales.  GI: Soft. Bowel sounds are normal. She exhibits no distension. There is no tenderness.  Musculoskeletal: Normal range of motion. She exhibits no edema.  No arthritis, no gout  Lymphadenopathy:    She has no cervical adenopathy.  Neurological: She is alert and oriented to person, place, and time. No cranial nerve deficit.  No dysarthria, no aphasia  Skin: Skin is warm and dry. No rash noted. No erythema.   Psychiatric: She has a normal mood and affect. Her behavior is normal. Judgment and thought content normal.    LABORATORY PANEL:   CBC  Recent Labs Lab 06/25/16 2049  WBC 10.9  HGB 9.4*  HCT 29.9*  PLT 373   ------------------------------------------------------------------------------------------------------------------  Chemistries   Recent Labs Lab 06/25/16 2049  NA 139  K 3.2*  CL 99*  CO2 29  GLUCOSE 141*  BUN 18  CREATININE 0.85  CALCIUM 8.8*   ------------------------------------------------------------------------------------------------------------------  Cardiac Enzymes  Recent Labs Lab 06/25/16 2049  TROPONINI 0.03*   ------------------------------------------------------------------------------------------------------------------  RADIOLOGY:  Ct Head Wo Contrast  06/25/2016  CLINICAL DATA:  Closed head injury. Syncope and fall this evening. Struck back of head during fall. On anticoagulation. EXAM: CT HEAD WITHOUT CONTRAST CT CERVICAL SPINE WITHOUT CONTRAST TECHNIQUE: Multidetector CT imaging of the head and cervical spine was performed following the standard protocol without intravenous contrast. Multiplanar CT image reconstructions of the cervical spine were also generated. COMPARISON:  None. FINDINGS: CT HEAD FINDINGS No intracranial hemorrhage, mass effect, or midline shift. No hydrocephalus. The basilar cisterns are patent. No evidence of territorial infarct. Lacunar infarct in the right basal ganglia appears remote. Scattered calcifications in the basal ganglia. No intracranial fluid collection. Left parietal scalp hematoma without subjacent fracture. Calvarium is intact. Included paranasal sinuses and mastoid air cells are well aerated. CT CERVICAL SPINE FINDINGS No fracture or acute subluxation. The dens is intact. There are no jumped or perched facets. None fusion posterior elements of C1, a normal variant. Disc space narrowing at C5-C6, C6-C7 and  C7-T1 with endplate spurring. There is multilevel facet arthropathy. Sclerosis of the facets at C3-C4 on the right likely secondary to degenerative change. No prevertebral soft tissue edema. Ground-glass opacities in the lung apices, previously assessed on high-resolution chest CT. IMPRESSION: 1. Left parietal scalp hematoma without skull fracture or acute intracranial abnormality. 2. 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 Electronically Signed   By: Jeb Levering M.D.   On: 06/25/2016 21:41   Ct Cervical Spine Wo Contrast  06/25/2016  CLINICAL DATA:  Closed head injury. Syncope and fall this evening. Struck back of head during fall. On  anticoagulation. EXAM: CT HEAD WITHOUT CONTRAST CT CERVICAL SPINE WITHOUT CONTRAST TECHNIQUE: Multidetector CT imaging of the head and cervical spine was performed following the standard protocol without intravenous contrast. Multiplanar CT image reconstructions of the cervical spine were also generated. COMPARISON:  None. FINDINGS: CT HEAD FINDINGS No intracranial hemorrhage, mass effect, or midline shift. No hydrocephalus. The basilar cisterns are patent. No evidence of territorial infarct. Lacunar infarct in the right basal ganglia appears remote. Scattered calcifications in the basal ganglia. No intracranial fluid collection. Left parietal scalp hematoma without subjacent fracture. Calvarium is intact. Included paranasal sinuses and mastoid air cells are well aerated. CT CERVICAL SPINE FINDINGS No fracture or acute subluxation. The dens is intact. There are no jumped or perched facets. None fusion posterior elements of C1, a normal variant. Disc space narrowing at C5-C6, C6-C7 and C7-T1 with endplate spurring. There is multilevel facet arthropathy. Sclerosis of the facets at C3-C4 on the right likely secondary to degenerative change. No prevertebral soft tissue edema. Ground-glass opacities in the lung apices, previously  assessed on high-resolution chest CT. IMPRESSION: 1. Left parietal scalp hematoma without skull fracture or acute intracranial abnormality. 2. 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 Electronically Signed   By: Jeb Levering M.D.   On: 06/25/2016 21:41    EKG:   Orders placed or performed during the hospital encounter of 06/25/16  . ED EKG  . ED EKG    IMPRESSION AND PLAN:  Principal Problem:   Syncope - strongly suspect this could be related to recently starting flecainide. However, we will monitor overnight on telemetry, trend her cardiac enzymes, get a cardiology consult for further evaluation. Active Problems:   Diabetes ([place]) - sliding scale insulin with corresponding glucose checks and carb modified diet   Prolonged Q-T interval on ECG - avoid QT prolonging agents. Patient's QT interval was prolonged on EKG tonight.   Essential hypertension, benign - continue home meds   Diastolic congestive heart failure ([place]) - continue home meds   A-fib ([place]) - not currently in A. fib, continue home meds except for flecainide.  All the records are reviewed and case discussed with ED provider. Management plans discussed with the patient and/or family.  DVT PROPHYLAXIS: Systemic anticoagulation  GI PROPHYLAXIS: None  ADMISSION STATUS: Observation  CODE STATUS: Full Code Status History    Date Active Date Inactive Code Status Order ID Comments User Context   05/10/2016  4:38 PM 05/12/2016  4:59 PM Full Code [id number]  Theodoro Grist, MD ED   04/01/2016  5:13 PM 04/03/2016  5:58 AM Full Code [id number]  Henreitta Leber, MD Inpatient    Advance Directive Documentation        Most Recent Value   Type of Advance Directive  Healthcare Power of Attorney, Living will   Pre-existing out of facility DNR order (yellow form or pink MOST form)     "MOST" Form in Place?        TOTAL TIME TAKING CARE OF THIS PATIENT: 40 minutes.    Fani Rotondo  [place] 06/26/2016, 12:04 AM  [facility]  Office  [phone]  CC: Primary care physician; Dion Body, MD

## 2016-06-27 ENCOUNTER — Observation Stay: Payer: Medicare Other

## 2016-06-27 LAB — ECHOCARDIOGRAM COMPLETE
Area-P 1/2: 1.75 cm2
E decel time: 489 msec
EERAT: 35.63
FS: 36 % (ref 28–44)
Height: 69 in
IVS/LV PW RATIO, ED: 1.16
LADIAMINDEX: 1.99 cm/m2
LASIZE: 45 mm
LAVOL: 69.3 mL
LAVOLA4C: 63.4 mL
LAVOLIN: 30.7 mL/m2
LEFT ATRIUM END SYS DIAM: 45 mm
LV TDI E'LATERAL: 6.09
LV e' LATERAL: 6.09 cm/s
LVEEAVG: 35.63
LVEEMED: 35.63
LVOT area: 2.84 cm2
LVOTD: 19 mm
MV Annulus VTI: 80.6 cm
MV Dec: 489
MV M vel: 145
MV pk E vel: 217 m/s
MVG: 10 mmHg
MVPG: 19 mmHg
MVPKAVEL: 117 m/s
MVSPHT: 132 ms
PW: 11.7 mm — AB (ref 0.6–1.1)
RV TAPSE: 22.5 mm
TDI e' medial: 5.55
Weight: 3942.4 oz

## 2016-06-27 LAB — GLUCOSE, CAPILLARY
GLUCOSE-CAPILLARY: 232 mg/dL — AB (ref 65–99)
Glucose-Capillary: 194 mg/dL — ABNORMAL HIGH (ref 65–99)
Glucose-Capillary: 369 mg/dL — ABNORMAL HIGH (ref 65–99)
Glucose-Capillary: 297 mg/dL — ABNORMAL HIGH (ref 65–99)

## 2016-06-27 LAB — BASIC METABOLIC PANEL
ANION GAP: 7 (ref 5–15)
BUN: 18 mg/dL (ref 6–20)
CALCIUM: 8.6 mg/dL — AB (ref 8.9–10.3)
CHLORIDE: 99 mmol/L — AB (ref 101–111)
CO2: 34 mmol/L — AB (ref 22–32)
CREATININE: 0.63 mg/dL (ref 0.44–1.00)
GFR calc non Af Amer: >60 mL/min (ref >60)
Glucose, Bld: 154 mg/dL — ABNORMAL HIGH (ref 65–99)
Potassium: 3.9 mmol/L (ref 3.5–5.1)
SODIUM: 140 mmol/L (ref 135–145)

## 2016-06-27 LAB — PROTIME-INR
INR: 2.03
PROTHROMBIN TIME: 22.8 s — AB (ref 11.4–15.0)

## 2016-06-27 LAB — CBC
HEMATOCRIT: 28 % — AB (ref 35.0–47.0)
HEMOGLOBIN: 9 g/dL — AB (ref 12.0–16.0)
MCH: 25 pg — ABNORMAL LOW (ref 26.0–34.0)
MCHC: 31.9 g/dL — AB (ref 32.0–36.0)
MCV: 78.4 fL — ABNORMAL LOW (ref 80.0–100.0)
Platelets: 338 10*3/uL (ref 150–440)
RBC: 3.58 MIL/uL — ABNORMAL LOW (ref 3.80–5.20)
RDW: 19.4 % — AB (ref 11.5–14.5)
WBC: 8.7 10*3/uL (ref 3.6–11.0)

## 2016-06-27 MED ORDER — FUROSEMIDE 40 MG PO TABS
40.0000 mg | ORAL_TABLET | Freq: Every day | ORAL | Status: DC
  Administered 2016-06-28: 40 mg via ORAL
  Filled 2016-06-27: qty 1

## 2016-06-27 MED ORDER — PREDNISONE 10 MG (21) PO TBPK: 10.0000 mg | ORAL_TABLET | Freq: Every day | ORAL | Status: DC

## 2016-06-27 MED ORDER — FUROSEMIDE 10 MG/ML IJ SOLN
40.0000 mg | INTRAMUSCULAR | Status: AC
  Administered 2016-06-27: 40 mg via INTRAVENOUS
  Filled 2016-06-27: qty 4

## 2016-06-27 MED ORDER — WARFARIN - PHARMACIST DOSING INPATIENT: Freq: Every day | Status: DC

## 2016-06-27 MED ORDER — HYDROCOD POLST-CPM POLST ER 10-8 MG/5ML PO SUER: 5.0000 mL | Freq: Two times a day (BID) | ORAL | Status: DC

## 2016-06-27 MED ORDER — POTASSIUM CHLORIDE ER 10 MEQ PO TBCR: 20.0000 meq | EXTENDED_RELEASE_TABLET | Freq: Every day | ORAL | Status: DC

## 2016-06-27 MED ORDER — HYDROCOD POLST-CPM POLST ER 10-8 MG/5ML PO SUER
5.0000 mL | Freq: Two times a day (BID) | ORAL | Status: DC
  Administered 2016-06-27 – 2016-06-28 (×3): 5 mL via ORAL
  Filled 2016-06-27 (×3): qty 5

## 2016-06-27 MED ORDER — ALBUTEROL SULFATE (2.5 MG/3ML) 0.083% IN NEBU: 2.5000 mg | INHALATION_SOLUTION | RESPIRATORY_TRACT | Status: DC | PRN

## 2016-06-27 MED ORDER — IPRATROPIUM-ALBUTEROL 0.5-2.5 (3) MG/3ML IN SOLN
3.0000 mL | RESPIRATORY_TRACT | Status: AC
  Administered 2016-06-27: 3 mL via RESPIRATORY_TRACT
  Filled 2016-06-27: qty 3

## 2016-06-27 MED ORDER — FUROSEMIDE 10 MG/ML IJ SOLN
20.0000 mg | Freq: Once | INTRAMUSCULAR | Status: AC
  Administered 2016-06-27: 20 mg via INTRAVENOUS
  Filled 2016-06-27: qty 2

## 2016-06-27 MED ORDER — METHYLPREDNISOLONE SODIUM SUCC 125 MG IJ SOLR
60.0000 mg | Freq: Once | INTRAMUSCULAR | Status: AC
Start: 2016-06-27 — End: 2016-06-27
  Administered 2016-06-27: 60 mg via INTRAVENOUS
  Filled 2016-06-27: qty 2

## 2016-06-27 MED ORDER — POTASSIUM CHLORIDE CRYS ER 20 MEQ PO TBCR
20.0000 meq | EXTENDED_RELEASE_TABLET | Freq: Every day | ORAL | Status: AC
  Administered 2016-06-27 – 2016-06-28 (×2): 20 meq via ORAL
  Filled 2016-06-27 (×2): qty 1

## 2016-06-27 MED ORDER — WARFARIN SODIUM 1 MG PO TABS
3.0000 mg | ORAL_TABLET | Freq: Once | ORAL | Status: AC
  Administered 2016-06-27: 3 mg via ORAL
  Filled 2016-06-27: qty 3

## 2016-06-27 MED ORDER — AZITHROMYCIN 250 MG PO TABS: ORAL_TABLET | ORAL | Status: DC

## 2016-06-27 NOTE — Progress Notes (Signed)
ANTICOAGULATION CONSULT NOTE - Initial Consult  Pharmacy Consult for Warfarin  Indication: atrial fibrillation  Allergies  Allergen Reactions  . Amiodarone Other (See Comments)    Pt states that this medication causes lung bleeding.      Patient Measurements: Height: 5\' 9"  (175.3 cm) Weight: 246 lb 6.4 oz (111.766 kg) IBW/kg (Calculated) : 66.2 Heparin Dosing Weight:   Vital Signs: Temp: 98.7 F (37.1 C) (07/06 1257) Temp Source: Oral (07/06 1257) BP: 138/58 mmHg (07/06 1257) Pulse Rate: 62 (07/06 1425)  Labs:  Recent Labs  06/25/16 2049 06/26/16 0241 06/26/16 0700 06/26/16 1256 06/27/16 0353  HGB 9.4*  --  8.9*  --  9.0*  HCT 29.9*  --  27.4*  --  28.0*  PLT 373  --  322  --  338  LABPROT 37.6* 33.9*  --   --  22.8*  INR 3.94 3.44  --   --  2.03  CREATININE 0.85  --  0.74  --  0.63  TROPONINI 0.03* 0.04* 0.04* 0.08*  --     Estimated Creatinine Clearance: 88.4 mL/min (by C-G formula based on Cr of 0.63).   Medical History: Past Medical History  Diagnosis Date  . Headache(784.0)   . Backache, unspecified   . Herpes zoster without mention of complication   . Osteoarthrosis, unspecified whether generalized or localized, unspecified site   . Nontoxic uninodular goiter   . Obesity, unspecified   . Esophageal reflux   . Unspecified sleep apnea   . Atrial fibrillation ([place])   . Acute diastolic heart failure ([place])   . Cardiomegaly   . Hypertension     heart controlled w CHF  . Asthma   . Diabetes mellitus without complication ([place])   . Allergy   . Hx: UTI (urinary tract infection)   . Urine incontinence     hx of  . 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 COPD (chronic obstructive pulmonary disease) ([facility])     Medications:  Prescriptions prior to admission  Medication Sig Dispense Refill Last Dose  . albuterol (PROVENTIL HFA;VENTOLIN HFA) 108 (90 Base) MCG/ACT inhaler Inhale 2 puffs  into the lungs every 4 (four) hours as needed for wheezing or shortness of breath. 1 Inhaler 10 prn at prn  . azaTHIOprine (IMURAN) 50 MG tablet Take 100 mg by mouth daily.    06/25/2016 at Unknown time  . Biotin 5000 MCG CAPS Take 5,000 mcg by mouth daily.    06/25/2016 at Unknown time  . calcium-vitamin D (OSCAL WITH D) 500-200 MG-UNIT per tablet Take 1 tablet by mouth daily.   06/25/2016 at Unknown time  . diltiazem (CARDIZEM CD) 360 MG 24 hr capsule Take 360 mg by mouth daily.  4 06/25/2016 at Unknown time  . flecainide (TAMBOCOR) 100 MG tablet Take 100 mg by mouth 2 (two) times daily.  11 06/25/2016 at Unknown time  . furosemide (LASIX) 40 MG tablet Take 40 mg by mouth daily.   06/25/2016 at Unknown time  . gabapentin (NEURONTIN) 600 MG tablet Take 600 mg by mouth 2 (two) times daily.   06/25/2016 at Unknown time  . HYDROcodone-acetaminophen (NORCO/VICODIN) 5-325 MG tablet Take 1 tablet by mouth every 8 (eight) hours as needed for moderate pain.   prn at prn  . metFORMIN (GLUCOPHAGE) 1000 MG tablet Take 1,000 mg by mouth 2 (two) times daily with a meal.    06/25/2016 at Unknown time  .  nitrofurantoin, macrocrystal-monohydrate, (MACROBID) 100 MG capsule Take 100 mg by mouth 2 (two) times daily.   06/25/2016 at Unknown time  . oxybutynin (DITROPAN) 5 MG tablet Take 5 mg by mouth 2 (two) times daily.   06/25/2016 at Unknown time  . pantoprazole (PROTONIX) 40 MG tablet Take 40 mg by mouth daily.   06/25/2016 at Unknown time  . rOPINIRole (REQUIP) 3 MG tablet Take 3 mg by mouth at bedtime.   06/24/2016 at Unknown time  . traZODone (DESYREL) 100 MG tablet Take 200 mg by mouth at bedtime.   06/24/2016 at Unknown time  . triamterene-hydrochlorothiazide (MAXZIDE-25) 37.5-25 MG tablet Take 1 tablet by mouth daily.   06/25/2016 at Unknown time  . Venlafaxine HCl 225 MG TB24 Take 225 mg by mouth daily.   06/25/2016 at Unknown time  . warfarin (COUMADIN) 6 MG tablet Take 6 mg by mouth daily.   06/24/2016 at Unknown time     Assessment: 7/4 INR @ 21:00 = 3.94  7/5 INR @ 03:00 = 3.44  7/6 INR @ 03:00 = 2.03 Pt was on warfarin 6 mg PO daily.   Goal of Therapy:  INR 2-3   Plan:  Will resume warfarin today with ad dose of 3 mg x 1 and f/u AM INR.     Ulice Dash D 06/27/2016,4:00 PM

## 2016-06-27 NOTE — Care Management (Signed)
Discussed with primary nurse that patient's 02 has not been weaned.  When this was performed, could only get patient down to 2.5 (her baseline is 2 liters).  Attending was informed and discharge cancelled.  patient to receive lasix and cxr to follow up for sx of CHF.  Patient and Advanced informed

## 2016-06-27 NOTE — Progress Notes (Signed)
[facility] Cardiology  SUBJECTIVE: I feel better   Filed Vitals:   06/26/16 1105 06/26/16 1954 06/27/16 0540 06/27/16 0805  BP: 137/74 123/77 146/56 131/65  Pulse: 56 77 63 59  Temp: 98.5 F (36.9 C) 99.3 F (37.4 C) 98.9 F (37.2 C) 98.4 F (36.9 C)  TempSrc: Oral Oral  Oral  Resp: 16 16 18 18   Height:      Weight:      SpO2: 94% 94% 94% 92%     Intake/Output Summary (Last 24 hours) at 06/27/16 0908 Last data filed at 06/27/16 0542  Gross per 24 hour  Intake    360 ml  Output   1700 ml  Net  -1340 ml      PHYSICAL EXAM  General: Well developed, well nourished, in no acute distress HEENT:  Normocephalic and atramatic Neck:  No JVD.  Lungs: Clear bilaterally to auscultation and percussion. Heart: HRRR . Normal S1 and S2 without gallops or murmurs.  Abdomen: Bowel sounds are positive, abdomen soft and non-tender  Msk:  Back normal, normal gait. Normal strength and tone for age. Extremities: No clubbing, cyanosis or edema.   Neuro: Alert and oriented X 3. Psych:  Good affect, responds appropriately   LABS: Basic Metabolic Panel:  Recent Labs  06/26/16 0700 06/27/16 0353  NA 138 140  K 3.6 3.9  CL 99* 99*  CO2 32 34*  GLUCOSE 137* 154*  BUN 20 18  CREATININE 0.74 0.63  CALCIUM 8.2* 8.6*   Liver Function Tests: No results for input(s): AST, ALT, ALKPHOS, BILITOT, PROT, ALBUMIN in the last 72 hours. No results for input(s): LIPASE, AMYLASE in the last 72 hours. CBC:  Recent Labs  06/26/16 0700 06/27/16 0353  WBC 10.4 8.7  HGB 8.9* 9.0*  HCT 27.4* 28.0*  MCV 79.5* 78.4*  PLT 322 338   Cardiac Enzymes:  Recent Labs  06/26/16 0241 06/26/16 0700 06/26/16 1256  TROPONINI 0.04* 0.04* 0.08*   BNP: Invalid input(s): POCBNP D-Dimer: No results for input(s): DDIMER in the last 72 hours. Hemoglobin A1C:  Recent Labs  06/26/16 0700  HGBA1C 6.7*   Fasting Lipid Panel: No results for input(s): CHOL, HDL, LDLCALC, TRIG, CHOLHDL, LDLDIRECT in the  last 72 hours. Thyroid Function Tests: No results for input(s): TSH, T4TOTAL, T3FREE, THYROIDAB in the last 72 hours.  Invalid input(s): FREET3 Anemia Panel: No results for input(s): VITAMINB12, FOLATE, FERRITIN, TIBC, IRON, RETICCTPCT in the last 72 hours.  Dg Chest 2 View  06/26/2016  CLINICAL DATA:  Evaluation of hypoxia. Patient states that she fell last night and hit her head. Patient also states she has had a recent change in her medications. EXAM: CHEST  2 VIEW COMPARISON:  05/10/2016 FINDINGS: Study somewhat limited by positioning and relatively low lung volumes. Cardiac silhouette is mildly enlarged. There is prominence of the left hilum that is likely vascular. No convincing mediastinal or hilar masses. There is vascular congestion. The interstitial markings are prominent but similar to the prior study. There is no focal consolidation to suggest pneumonia. No pleural effusion or pneumothorax. Bony thorax is demineralized but grossly intact. IMPRESSION: 1. Mild cardiomegaly and central vascular congestion. A mild degree of congestive heart failure is suspected. 2. No evidence of pneumonia. Electronically Signed   By: Lajean Manes M.D.   On: 06/26/2016 13:09   Ct Head Wo Contrast  06/25/2016  CLINICAL DATA:  Closed head injury. Syncope and fall this evening. Struck back of head during fall. On anticoagulation. EXAM: CT  HEAD WITHOUT CONTRAST CT CERVICAL SPINE WITHOUT CONTRAST TECHNIQUE: Multidetector CT imaging of the head and cervical spine was performed following the standard protocol without intravenous contrast. Multiplanar CT image reconstructions of the cervical spine were also generated. COMPARISON:  None. FINDINGS: CT HEAD FINDINGS No intracranial hemorrhage, mass effect, or midline shift. No hydrocephalus. The basilar cisterns are patent. No evidence of territorial infarct. Lacunar infarct in the right basal ganglia appears remote. Scattered calcifications in the basal ganglia. No  intracranial fluid collection. Left parietal scalp hematoma without subjacent fracture. Calvarium is intact. Included paranasal sinuses and mastoid air cells are well aerated. CT CERVICAL SPINE FINDINGS No fracture or acute subluxation. The dens is intact. There are no jumped or perched facets. None fusion posterior elements of C1, a normal variant. Disc space narrowing at C5-C6, C6-C7 and C7-T1 with endplate spurring. There is multilevel facet arthropathy. Sclerosis of the facets at C3-C4 on the right likely secondary to degenerative change. No prevertebral soft tissue edema. Ground-glass opacities in the lung apices, previously assessed on high-resolution chest CT. IMPRESSION: 1. Left parietal scalp hematoma without skull fracture or acute intracranial abnormality. 2. 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 Electronically Signed   By: Jeb Levering M.D.   On: 06/25/2016 21:41   Ct Cervical Spine Wo Contrast  06/25/2016  CLINICAL DATA:  Closed head injury. Syncope and fall this evening. Struck back of head during fall. On anticoagulation. EXAM: CT HEAD WITHOUT CONTRAST CT CERVICAL SPINE WITHOUT CONTRAST TECHNIQUE: Multidetector CT imaging of the head and cervical spine was performed following the standard protocol without intravenous contrast. Multiplanar CT image reconstructions of the cervical spine were also generated. COMPARISON:  None. FINDINGS: CT HEAD FINDINGS No intracranial hemorrhage, mass effect, or midline shift. No hydrocephalus. The basilar cisterns are patent. No evidence of territorial infarct. Lacunar infarct in the right basal ganglia appears remote. Scattered calcifications in the basal ganglia. No intracranial fluid collection. Left parietal scalp hematoma without subjacent fracture. Calvarium is intact. Included paranasal sinuses and mastoid air cells are well aerated. CT CERVICAL SPINE FINDINGS No fracture or acute subluxation. The dens  is intact. There are no jumped or perched facets. None fusion posterior elements of C1, a normal variant. Disc space narrowing at C5-C6, C6-C7 and C7-T1 with endplate spurring. There is multilevel facet arthropathy. Sclerosis of the facets at C3-C4 on the right likely secondary to degenerative change. No prevertebral soft tissue edema. Ground-glass opacities in the lung apices, previously assessed on high-resolution chest CT. IMPRESSION: 1. Left parietal scalp hematoma without skull fracture or acute intracranial abnormality. 2. 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 Electronically Signed   By: Jeb Levering M.D.   On: 06/25/2016 21:41   US Carotid Bilateral  06/26/2016  CLINICAL DATA:  Syncope. EXAM: BILATERAL CAROTID DUPLEX ULTRASOUND TECHNIQUE: Pearline Cables scale imaging, color Doppler and duplex ultrasound were performed of bilateral carotid and vertebral arteries in the neck. COMPARISON:  CT 06/25/2016.  CT 11/10/2013. FINDINGS: Criteria: Quantification of carotid stenosis is based on velocity parameters that correlate the residual internal carotid diameter with NASCET-based stenosis levels, using the diameter of the distal internal carotid lumen as the denominator for stenosis measurement. The following velocity measurements were obtained: RIGHT ICA:  309/73 cm/sec CCA:  0000000 cm/sec SYSTOLIC ICA/CCA RATIO:  2.7 DIASTOLIC ICA/CCA RATIO:  2.3 ECA:  185 cm/sec LEFT ICA:  217/71 cm/sec CCA:  [id number] cm/sec SYSTOLIC ICA/CCA RATIO:  1.7 DIASTOLIC ICA/CCA RATIO:  2.1 ECA:  260 cm/sec RIGHT CAROTID ARTERY: Moderate right carotid bifurcation proximal ICA plaque degree of stenosis in the 50-69% range using flow velocity ratios and color flow evaluation. RIGHT VERTEBRAL ARTERY:  Patent with antegrade flow. LEFT CAROTID ARTERY: Moderate left carotid bifurcation and proximal ICA plaque with degree of stenosis in the 50-69% range using flow velocities and color flow  evaluation. LEFT VERTEBRAL ARTERY:  Patent antegrade flow. Exam limited due to patient's mobility and difficulty cooperating. IMPRESSION: 1. 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 2. Vertebral arteries are patent antegrade flow. Electronically Signed   By: Marcello Moores  Register   On: 06/26/2016 12:41     Echo normal left ventricular function with LVEF 55-65%, with moderate severe tricuspid regurgitation  TELEMETRY: Sinus rhythm:  ASSESSMENT AND PLAN:  Principal Problem:   Syncope Active Problems:   Diabetes (HCC)   Prolonged Q-T interval on ECG   Essential hypertension, benign   Diastolic congestive heart failure (HCC)   A-fib ([place])    1. Syncope, apparently related to flecainide intolerance, in sinus rhythm 2. Paroxysmal atrial fibrillation, status post prior catheter ablation, with recurrent paroxysmal H fibrillation, being considered for possible redo catheter ablation, with multiple intolerances to medications including amiodarone, metoprolol, Lanoxin, and flecainide  Recommendations  1. DC flecanide 2. Continue long-acting Cardizem for rate control 3. May consider discharge later today, for reevaluation with Dr. Mylinda Latina for repeat catheter ablation or AV nodal ablation with pacemaker implantation  Sign off for now, please call if any questions   Leibish Mcgregor, MD, PhD, [facility] 06/27/2016 9:08 AM

## 2016-06-27 NOTE — Care Management (Signed)
patient is for discharge home today.  Physical therapy has recommended home health physical therapy and patient is in agreement.  Patient will benefit from home health nurse to follow response to medications changes/ cardiopulmonary status.  Fecainide has been discontinued.  May require outpatient follow up for repeat ablation or pacemaker insertion.  Agency preference is [facility]

## 2016-06-27 NOTE — Progress Notes (Signed)
MD notified. Pt has a discharge order.  Pt is requiring 3.5L oxygen per [place] for saturation of 92%. Pt is chronic 2L at home. Orders will be placed for chest xray. I will continue to assess.

## 2016-06-27 NOTE — Progress Notes (Addendum)
[place] [facility] [place] NAME: Betty [name]    MR#:  [id number]  DATE OF BIRTH:  [birth year]  SUBJECTIVE:  CHIEF COMPLAINT:   Chief Complaint  Patient presents with  . Fall  . Loss of Consciousness   - Feels much stronger. Headache is better. -Still complains of some dyspnea and requiring about 3-4 L of oxygen.  REVIEW OF SYSTEMS:  Review of Systems  Constitutional: Positive for malaise/fatigue. Negative for fever and chills.  HENT: Negative for ear discharge, ear pain and nosebleeds.   Eyes: Negative for blurred vision and double vision.  Respiratory: Positive for shortness of breath. Negative for cough and wheezing.   Cardiovascular: Negative for chest pain, palpitations and leg swelling.  Gastrointestinal: Negative for nausea, vomiting, abdominal pain, diarrhea and constipation.  Genitourinary: Negative for dysuria.  Musculoskeletal: Negative for myalgias.  Neurological: Negative for dizziness, seizures and headaches.    DRUG ALLERGIES:   Allergies  Allergen Reactions  . Amiodarone Other (See Comments)    Pt states that this medication causes lung bleeding.      VITALS:  Blood pressure 138/58, pulse 62, temperature 98.7 F (37.1 C), temperature source Oral, resp. rate 18, height 5\' 9"  (1.753 m), weight 111.766 kg (246 lb 6.4 oz), SpO2 93 %.  PHYSICAL EXAMINATION:  Physical Exam  GENERAL:  70 y.o.-year-old patient lying in the bed with no acute distress.  EYES: Pupils equal, round, reactive to light and accommodation. No scleral icterus. Extraocular muscles intact.  HEENT: Head atraumatic, normocephalic. Oropharynx and nasopharynx clear. Left parietal scalp laceration- no bleeding NECK:  Supple, no jugular venous distention. No thyroid enlargement, no tenderness.  LUNGS: Normal breath sounds bilaterally, no wheezing, rhonchi or crepitation. No use of accessory muscles of respiration. Bibasilar crackles heard, worse at the  right base.  CARDIOVASCULAR: S1, S2 irregular. No murmurs, rubs, or gallops.  ABDOMEN: Soft, nontender, nondistended. Bowel sounds present. No organomegaly or mass.  EXTREMITIES: No pedal edema, cyanosis, or clubbing.  NEUROLOGIC: Cranial nerves II through XII are intact. Muscle strength 5/5 in all extremities. Sensation intact. Gait not checked.  PSYCHIATRIC: The patient is alert and oriented x 3.  SKIN: No obvious rash, lesion, or ulcer.    LABORATORY PANEL:   CBC  Recent Labs Lab 06/27/16 0353  WBC 8.7  HGB 9.0*  HCT 28.0*  PLT 338   ------------------------------------------------------------------------------------------------------------------  Chemistries   Recent Labs Lab 06/27/16 0353  NA 140  K 3.9  CL 99*  CO2 34*  GLUCOSE 154*  BUN 18  CREATININE 0.63  CALCIUM 8.6*   ------------------------------------------------------------------------------------------------------------------  Cardiac Enzymes  Recent Labs Lab 06/26/16 1256  TROPONINI 0.08*   ------------------------------------------------------------------------------------------------------------------  RADIOLOGY:  Dg Chest 2 View  06/26/2016  CLINICAL DATA:  Evaluation of hypoxia. Patient states that she fell last night and hit her head. Patient also states she has had a recent change in her medications. EXAM: CHEST  2 VIEW COMPARISON:  05/10/2016 FINDINGS: Study somewhat limited by positioning and relatively low lung volumes. Cardiac silhouette is mildly enlarged. There is prominence of the left hilum that is likely vascular. No convincing mediastinal or hilar masses. There is vascular congestion. The interstitial markings are prominent but similar to the prior study. There is no focal consolidation to suggest pneumonia. No pleural effusion or pneumothorax. Bony thorax is demineralized but grossly intact. IMPRESSION: 1. Mild cardiomegaly and central vascular congestion. A mild degree of congestive  heart failure is suspected. 2. No evidence of  pneumonia. Electronically Signed   By: Lajean Manes M.D.   On: 06/26/2016 13:09   Ct Head Wo Contrast  06/25/2016  CLINICAL DATA:  Closed head injury. Syncope and fall this evening. Struck back of head during fall. On anticoagulation. EXAM: CT HEAD WITHOUT CONTRAST CT CERVICAL SPINE WITHOUT CONTRAST TECHNIQUE: Multidetector CT imaging of the head and cervical spine was performed following the standard protocol without intravenous contrast. Multiplanar CT image reconstructions of the cervical spine were also generated. COMPARISON:  None. FINDINGS: CT HEAD FINDINGS No intracranial hemorrhage, mass effect, or midline shift. No hydrocephalus. The basilar cisterns are patent. No evidence of territorial infarct. Lacunar infarct in the right basal ganglia appears remote. Scattered calcifications in the basal ganglia. No intracranial fluid collection. Left parietal scalp hematoma without subjacent fracture. Calvarium is intact. Included paranasal sinuses and mastoid air cells are well aerated. CT CERVICAL SPINE FINDINGS No fracture or acute subluxation. The dens is intact. There are no jumped or perched facets. None fusion posterior elements of C1, a normal variant. Disc space narrowing at C5-C6, C6-C7 and C7-T1 with endplate spurring. There is multilevel facet arthropathy. Sclerosis of the facets at C3-C4 on the right likely secondary to degenerative change. No prevertebral soft tissue edema. Ground-glass opacities in the lung apices, previously assessed on high-resolution chest CT. IMPRESSION: 1. Left parietal scalp hematoma without skull fracture or acute intracranial abnormality. 2. 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 Electronically Signed   By: Jeb Levering M.D.   On: 06/25/2016 21:41   Ct Cervical Spine Wo Contrast  06/25/2016  CLINICAL DATA:  Closed head injury. Syncope and fall this evening. Struck back of  head during fall. On anticoagulation. EXAM: CT HEAD WITHOUT CONTRAST CT CERVICAL SPINE WITHOUT CONTRAST TECHNIQUE: Multidetector CT imaging of the head and cervical spine was performed following the standard protocol without intravenous contrast. Multiplanar CT image reconstructions of the cervical spine were also generated. COMPARISON:  None. FINDINGS: CT HEAD FINDINGS No intracranial hemorrhage, mass effect, or midline shift. No hydrocephalus. The basilar cisterns are patent. No evidence of territorial infarct. Lacunar infarct in the right basal ganglia appears remote. Scattered calcifications in the basal ganglia. No intracranial fluid collection. Left parietal scalp hematoma without subjacent fracture. Calvarium is intact. Included paranasal sinuses and mastoid air cells are well aerated. CT CERVICAL SPINE FINDINGS No fracture or acute subluxation. The dens is intact. There are no jumped or perched facets. None fusion posterior elements of C1, a normal variant. Disc space narrowing at C5-C6, C6-C7 and C7-T1 with endplate spurring. There is multilevel facet arthropathy. Sclerosis of the facets at C3-C4 on the right likely secondary to degenerative change. No prevertebral soft tissue edema. Ground-glass opacities in the lung apices, previously assessed on high-resolution chest CT. IMPRESSION: 1. Left parietal scalp hematoma without skull fracture or acute intracranial abnormality. 2. 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 Electronically Signed   By: Jeb Levering M.D.   On: 06/25/2016 21:41   US Carotid Bilateral  06/26/2016  CLINICAL DATA:  Syncope. EXAM: BILATERAL CAROTID DUPLEX ULTRASOUND TECHNIQUE: Pearline Cables scale imaging, color Doppler and duplex ultrasound were performed of bilateral carotid and vertebral arteries in the neck. COMPARISON:  CT 06/25/2016.  CT 11/10/2013. FINDINGS: Criteria: Quantification of carotid stenosis is based on velocity parameters  that correlate the residual internal carotid diameter with NASCET-based stenosis levels, using the diameter of the distal internal carotid lumen as the denominator for stenosis measurement. The  following velocity measurements were obtained: RIGHT ICA:  309/73 cm/sec CCA:  0000000 cm/sec SYSTOLIC ICA/CCA RATIO:  2.7 DIASTOLIC ICA/CCA RATIO:  2.3 ECA:  185 cm/sec LEFT ICA:  217/71 cm/sec CCA:  [id number] cm/sec SYSTOLIC ICA/CCA RATIO:  1.7 DIASTOLIC ICA/CCA RATIO:  2.1 ECA:  260 cm/sec RIGHT CAROTID ARTERY: Moderate right carotid bifurcation proximal ICA plaque degree of stenosis in the 50-69% range using flow velocity ratios and color flow evaluation. RIGHT VERTEBRAL ARTERY:  Patent with antegrade flow. LEFT CAROTID ARTERY: Moderate left carotid bifurcation and proximal ICA plaque with degree of stenosis in the 50-69% range using flow velocities and color flow evaluation. LEFT VERTEBRAL ARTERY:  Patent antegrade flow. Exam limited due to patient's mobility and difficulty cooperating. IMPRESSION: 1. 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 2. Vertebral arteries are patent antegrade flow. Electronically Signed   By: Marcello Moores  Register   On: 06/26/2016 12:41   Dg Chest Port 1 View  06/27/2016  CLINICAL DATA:  Hypoxia EXAM: PORTABLE CHEST 1 VIEW COMPARISON:  Chest radiograph from one day prior. FINDINGS: Stable cardiomediastinal silhouette with mild cardiomegaly. No pneumothorax. No pleural effusion. Mild to moderate pulmonary edema. IMPRESSION: Mild-to-moderate congestive heart failure. Electronically Signed   By: Ilona Sorrel M.D.   On: 06/27/2016 14:21    EKG:   Orders placed or performed during the hospital encounter of 06/25/16  . ED EKG  . ED EKG    ASSESSMENT AND PLAN:   70 year old female with past medical history significant for arthritis, p-ANCA positive pulmonary hemorrhage, ILD on 2 L home oxygen, diastolic  CHF, diabetes mellitus, COPD presents to the hospital secondary to syncopal episode.  #1 syncope-could be secondary to side effect from flecainide. -Patient recently started on flecainide and has been experiencing lightheadedness. -Could currently flecainide is on hold. Vascular Dopplers and echocardiogram has been ordered. -Continue to monitor on telemetry.  #2 Acute on chronic hypoxia-likely diastolic CHF exacerbation on top of underlying interstitial lung disease. - ECHO with EF [id number], diastolic dysfunction likely - patient on 2 L home oxygen for her ILD for the last 2 months.  - Recent CT chest about a month ago has shown improvement in her pulmonary alveolar hemorrhage. - Continue to wean oxygen -start lasix iv today and daily -Patient has underlying ILD and vasculitis. Currently on Imuran. Also prednisone taper at discharge, received 1 dose of solumedrol today due to some scattered wheezing  #3 atrial fibrillation-chronic A. fib status post prior ablation. Recently started on flecainide with side effects. -Discontinue flecainide, currently in NSR. Might need repeat ablation. -Cardiology consulted. On Cardizem for rate control. -Warfarin can be restarted as INR down to 2.0 now #3 Elevated troponin- slight bump- demand ischemia, not acute MI  #4 neuropathy-continue gabapentin.  #5 hypokalemia-replaced  #6 hypertension-meds on hold.  #7 DVT prophylaxis-was on warfarin as outpatient. Restart today as INR at 2.0.  Physical therapy consulted. Home health at discharge. Possible discharge tomorrow   All the records are reviewed and case discussed with Care Management/Social Workerr. Management plans discussed with the patient, family and they are in agreement.  CODE STATUS: Full Code  TOTAL TIME TAKING CARE OF THIS PATIENT: 37 minutes.   POSSIBLE D/C IN 1-2 DAYS, DEPENDING ON CLINICAL CONDITION.   Jaevian Shean M.D on 06/27/2016 at 3:18 PM  Between 7am to 6pm - Pager -  [phone]  After 6pm go to [email or web address] - password [email or web address]  Office  [phone]  CC: Primary care physician; Dion Body, MD

## 2016-06-27 NOTE — Evaluation (Signed)
Physical Therapy Evaluation Patient Details Name: Betty [name] MRN: [id number] DOB: 12/19/1946 Today's Date: 06/27/2016   History of Present Illness  70 yo female with syncopal episode after new med caused lung alveolar hemorrhage, now light headed and had a fall prior to admission.  PMHx:  a-fib, autoimmune-based vasculitis, COPD, UTI, sleep apnea, CHF  Clinical Impression  Pt is able to walk in room with no AD, but is very careful and needs to be supervised.  She is thinking about whether she is willing to take AD with her to walk but given her being home alone needs the assist.  Will continue rehab in acute setting as pt remains in hosp to transition to [facility] when done.    Follow Up Recommendations Home health PT    Equipment Recommendations  Other (comment) (rollator if not using one at home)    Recommendations for Other Services Rehab consult     Precautions / Restrictions Precautions Precautions: Fall (telmetry) Restrictions Weight Bearing Restrictions: No      Mobility  Bed Mobility Overal bed mobility: Needs Assistance Bed Mobility: Supine to Sit;Sit to Supine     Supine to sit: Min assist;Mod assist Sit to supine: Min guard   General bed mobility comments: using bedrail and assist under trunk  Transfers Overall transfer level: Needs assistance Equipment used: Rolling walker (2 wheeled);1 person hand held assist Transfers: Sit to/from [facility] Sit to Stand: Min assist;Min guard (to power up from lower height) Stand pivot transfers: Min guard (for safety on walker)       General transfer comment: cued hand placement  Ambulation/Gait Ambulation/Gait assistance: Min guard Ambulation Distance (Feet): 60 Feet Assistive device: 1 person hand held assist Gait Pattern/deviations: Step-through pattern;Shuffle;Wide base of support;Trunk flexed Gait velocity: reduced and careful Gait velocity interpretation: Below normal speed for  age/gender General Gait Details: Pt is walking with contact to balance and notable need to touch surfaces at times  Stairs            Wheelchair Mobility    Modified Rankin (Stroke Patients Only)       Balance Overall balance assessment: Needs assistance Sitting-balance support: Feet supported Sitting balance-Leahy Scale: Good     Standing balance support: No upper extremity supported;Single extremity supported Standing balance-Leahy Scale: Fair                               Pertinent Vitals/Pain Pain Assessment: Faces Faces Pain Scale: Hurts little more Pain Location: back and R posterior shoulder from fall Pain Descriptors / Indicators: Aching Pain Intervention(s): Monitored during session;Repositioned    Home Living Family/patient expects to be discharged to:: Private residence Living Arrangements: Alone Available Help at Discharge: Other (Comment) (no clear plan yet) Type of Home: House Home Access: Stairs to enter Entrance Stairs-Rails: Right Entrance Stairs-Number of Steps: 2 Home Layout: One level Home Equipment: None Additional Comments: would benefit from rollator if not using one yet    Prior Function Level of Independence: Independent               Hand Dominance        Extremity/Trunk Assessment   Upper Extremity Assessment: Overall WFL for tasks assessed           Lower Extremity Assessment: Overall WFL for tasks assessed      Cervical / Trunk Assessment: Normal  Communication   Communication: No difficulties  Cognition Arousal/Alertness: Awake/alert Behavior During  Therapy: WFL for tasks assessed/performed Overall Cognitive Status: Within Functional Limits for tasks assessed                      General Comments      Exercises        Assessment/Plan    PT Assessment Patient needs continued PT services  PT Diagnosis Difficulty walking   PT Problem List Decreased range of motion;Decreased  activity tolerance;Decreased balance;Decreased mobility;Decreased coordination;Decreased knowledge of use of DME;Decreased safety awareness;Cardiopulmonary status limiting activity;Pain;Decreased skin integrity  PT Treatment Interventions DME instruction;Gait training;Stair training;Therapeutic activities;Functional mobility training;Therapeutic exercise;Balance training;Neuromuscular re-education;Patient/family education   PT Goals (Current goals can be found in the Care Plan section) Acute Rehab PT Goals Patient Stated Goal: To get home safely PT Goal Formulation: With patient Time For Goal Achievement: 07/04/16 Potential to Achieve Goals: Good    Frequency Min 2X/week   Barriers to discharge Decreased caregiver support home alone    Co-evaluation               End of Session Equipment Utilized During Treatment: Gait belt;Oxygen (line for O2 is long enought to be a trip hazard) Activity Tolerance: Patient tolerated treatment well;Patient limited by fatigue Patient left: in bed;with call bell/phone within reach Nurse Communication: Mobility status    Functional Assessment Tool Used: clinical judgment Functional Limitation: Mobility: Walking and moving around Mobility: Walking and Moving Around Current Status [id number]): At least 20 percent but less than 40 percent impaired, limited or restricted Mobility: Walking and Moving Around Goal Status [phone]): At least 1 percent but less than 20 percent impaired, limited or restricted    Time: 1002-1041 PT Time Calculation (min) (ACUTE ONLY): 39 min   Charges:   PT Evaluation $PT Eval Moderate Complexity: 1 Procedure PT Treatments $Gait Training: 8-22 mins $Therapeutic Exercise: 8-22 mins   PT G Codes:   PT G-Codes **NOT FOR INPATIENT CLASS** Functional Assessment Tool Used: clinical judgment Functional Limitation: Mobility: Walking and moving around Mobility: Walking and Moving Around Current Status [id number]): At least 20 percent  but less than 40 percent impaired, limited or restricted Mobility: Walking and Moving Around Goal Status [phone]): At least 1 percent but less than 20 percent impaired, limited or restricted    Ramond Dial 06/27/2016, 12:34 PM  Mee Hives, PT MS Acute Rehab Dept. Number: [place] [facility] [place]

## 2016-06-28 ENCOUNTER — Observation Stay: Payer: Medicare Other

## 2016-06-28 LAB — GLUCOSE, CAPILLARY
GLUCOSE-CAPILLARY: 151 mg/dL — AB (ref 65–99)
GLUCOSE-CAPILLARY: 267 mg/dL — AB (ref 65–99)

## 2016-06-28 LAB — BASIC METABOLIC PANEL
ANION GAP: 6 (ref 5–15)
BUN: 23 mg/dL — ABNORMAL HIGH (ref 6–20)
CALCIUM: 8.4 mg/dL — AB (ref 8.9–10.3)
CHLORIDE: 100 mmol/L — AB (ref 101–111)
CO2: 32 mmol/L (ref 22–32)
CREATININE: 0.58 mg/dL (ref 0.44–1.00)
GFR calc non Af Amer: >60 mL/min (ref >60)
GLUCOSE: 167 mg/dL — AB (ref 65–99)
Potassium: 4 mmol/L (ref 3.5–5.1)
Sodium: 138 mmol/L (ref 135–145)

## 2016-06-28 LAB — PROTIME-INR
INR: 1.39
Prothrombin Time: 17.2 seconds — ABNORMAL HIGH (ref 11.4–15.0)

## 2016-06-28 MED ORDER — WARFARIN SODIUM 1 MG PO TABS: 3.0000 mg | ORAL_TABLET | Freq: Once | ORAL | Status: DC

## 2016-06-28 MED ORDER — ALBUTEROL SULFATE (2.5 MG/3ML) 0.083% IN NEBU: 2.5000 mg | INHALATION_SOLUTION | RESPIRATORY_TRACT | Status: DC | PRN

## 2016-06-28 MED ORDER — DIPHENHYDRAMINE HCL 25 MG PO CAPS
25.0000 mg | ORAL_CAPSULE | Freq: Once | ORAL | Status: AC
  Administered 2016-06-28: 25 mg via ORAL
  Filled 2016-06-28: qty 1

## 2016-06-28 MED ORDER — PREDNISONE 10 MG (21) PO TBPK: 10.0000 mg | ORAL_TABLET | Freq: Every day | ORAL | Status: DC

## 2016-06-28 MED ORDER — PREDNISONE 50 MG PO TABS
50.0000 mg | ORAL_TABLET | Freq: Every day | ORAL | Status: DC
  Administered 2016-06-28: 50 mg via ORAL
  Filled 2016-06-28: qty 1

## 2016-06-28 NOTE — Discharge Summary (Signed)
[facility] [place] NAME: Betty [name]    MR#:  [id number]  DATE OF BIRTH:  May 10, 1946  DATE OF ADMISSION:  06/25/2016 ADMITTING PHYSICIAN: Lance Coon, MD  DATE OF DISCHARGE: 06/28/2016  1:39 PM  PRIMARY CARE PHYSICIAN: Dion Body, MD    ADMISSION DIAGNOSIS:  Syncope and collapse [R55] Closed head injury, initial encounter [S09.90XA]  DISCHARGE DIAGNOSIS:  Principal Problem:   Syncope Active Problems:   Diabetes ([place])   Prolonged Q-T interval on ECG   Essential hypertension, benign   Diastolic congestive heart failure (H[facility])   A-fib ([place])   SECONDARY DIAGNOSIS:   Past Medical History  Diagnosis Date  . Headache(784.0)   . Backache, unspecified   . Herpes zoster without mention of complication   . Osteoarthrosis, unspecified whether generalized or localized, unspecified site   . Nontoxic uninodular goiter   . Obesity, unspecified   . Esophageal reflux   . Unspecified sleep apnea   . Atrial fibrillation ([place])   . Acute diastolic heart failure ([place])   . Cardiomegaly   . Hypertension     heart controlled w CHF  . Asthma   . Diabetes mellitus without complication ([name])   . Allergy   . Hx: UTI (urinary tract infection)   . Urine incontinence     hx of  . 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 COPD (chronic obstructive pulmonary disease) [facility])     HOSPITAL COURSE:   69 year old female with past medical history significant for arthritis, p-ANCA positive pulmonary hemorrhage, ILD on 2 L home oxygen, diastolic CHF, diabetes mellitus, COPD presents to the hospital secondary to syncopal episode.  #1 syncope-could be secondary to side effect from flecainide. -Patient recently started on flecainide and has been experiencing lightheadedness. -Vascular Dopplers are normal and echocardiogram with normal ejection fraction. -Appreciate cardiology  consult. Discontinued flecainide  #2 Acute on chronic hypoxia-likely diastolic CHF exacerbation on top of underlying interstitial lung disease. - ECHO with EF [id number], diastolic dysfunction likely - patient on 2 L home oxygen for her ILD for the last 2 months.  - Recent CT chest about a month ago has shown improvement in her pulmonary alveolar hemorrhage. - restarted home dose lasix with improvement -Patient has underlying ILD and vasculitis. Currently on Imuran. Also prednisone taper at discharge  #3 atrial fibrillation-chronic A. fib status post prior ablation. Recently started on flecainide with side effects. -Discontinued flecainide, currently in NSR. Might need repeat ablation. -Cardiology consulted. On Cardizem for rate control. -Warfarin is restarted  #4 neuropathy-continue gabapentin.  #6 diabetes mellitus-continue metformin  Worked with physical therapy and they have recommended home health.  DISCHARGE CONDITIONS:   Stable  CONSULTS OBTAINED:  Treatment Team:  Isaias Cowman, MD  DRUG ALLERGIES:   Allergies  Allergen Reactions  . Amiodarone Other (See Comments)    Pt states that this medication causes lung bleeding.      DISCHARGE MEDICATIONS:   Discharge Medication List as of 06/28/2016 12:40 PM    START taking these medications   Details  albuterol (PROVENTIL) (2.5 MG/3ML) 0.083% nebulizer solution Take 3 mLs (2.5 mg total) by nebulization every 4 (four) hours as needed for wheezing or shortness of breath., Starting 06/28/2016, Until Discontinued, Normal    azithromycin (ZITHROMAX Z-PAK) 250 MG tablet Take 2 tabs on day 1 And then 1 tab every day until finished., Normal    chlorpheniramine-HYDROcodone (  TUSSIONEX) 10-8 MG/5ML SUER Take 5 mLs by mouth every 12 (twelve) hours., Starting 06/27/2016, Until Discontinued, Print    potassium chloride (K-DUR) 10 MEQ tablet Take 2 tablets (20 mEq total) by mouth daily. While on lasix, Starting 06/27/2016, Until  Discontinued, Normal      CONTINUE these medications which have CHANGED   Details  predniSONE (STERAPRED UNI-PAK 21 TAB) 10 MG (21) TBPK tablet Take 1 tablet (10 mg total) by mouth daily. 4 tabs PO x 1 day 3 tabs PO x 1 day 2 tabs PO x 1 day 1 tab PO x 1 day and stop, Starting 06/28/2016, Until Discontinued, Normal      CONTINUE these medications which have NOT CHANGED   Details  albuterol (PROVENTIL HFA;VENTOLIN HFA) 108 (90 Base) MCG/ACT inhaler Inhale 2 puffs into the lungs every 4 (four) hours as needed for wheezing or shortness of breath., Starting 01/17/2016, Until Discontinued, Normal    azaTHIOprine (IMURAN) 50 MG tablet Take 100 mg by mouth daily. , Until Discontinued, Historical Med    Biotin 5000 MCG CAPS Take 5,000 mcg by mouth daily. , Until Discontinued, Historical Med    calcium-vitamin D (OSCAL WITH D) 500-200 MG-UNIT per tablet Take 1 tablet by mouth daily., Until Discontinued, Historical Med    diltiazem (CARDIZEM CD) 360 MG 24 hr capsule Take 360 mg by mouth daily., Starting 06/17/2016, Until Discontinued, Historical Med    furosemide (LASIX) 40 MG tablet Take 40 mg by mouth daily., Until Discontinued, Historical Med    gabapentin (NEURONTIN) 600 MG tablet Take 600 mg by mouth 2 (two) times daily., Until Discontinued, Historical Med    HYDROcodone-acetaminophen (NORCO/VICODIN) 5-325 MG tablet Take 1 tablet by mouth every 8 (eight) hours as needed for moderate pain., Until Discontinued, Historical Med    metFORMIN (GLUCOPHAGE) 1000 MG tablet Take 1,000 mg by mouth 2 (two) times daily with a meal. , Until Discontinued, Historical Med    oxybutynin (DITROPAN) 5 MG tablet Take 5 mg by mouth 2 (two) times daily., Until Discontinued, Historical Med    pantoprazole (PROTONIX) 40 MG tablet Take 40 mg by mouth daily., Until Discontinued, Historical Med    rOPINIRole (REQUIP) 3 MG tablet Take 3 mg by mouth at bedtime., Until Discontinued, Historical Med    traZODone  (DESYREL) 100 MG tablet Take 200 mg by mouth at bedtime., Until Discontinued, Historical Med    Venlafaxine HCl 225 MG TB24 Take 225 mg by mouth daily., Until Discontinued, Historical Med    warfarin (COUMADIN) 6 MG tablet Take 6 mg by mouth daily., Until Discontinued, Historical Med      STOP taking these medications     flecainide (TAMBOCOR) 100 MG tablet      nitrofurantoin, macrocrystal-monohydrate, (MACROBID) 100 MG capsule      triamterene-hydrochlorothiazide (MAXZIDE-25) 37.5-25 MG tablet          DISCHARGE INSTRUCTIONS:   1. PCP follow-up in 1-2 weeks 2. Rheumatology follow-up in 1 week as prior schedule 3. Pulmonary rehabilitation advised 4. Pulmonary follow-up in 2 weeks 5. Cardiology follow-up next week with Dr. Mylinda Latina  If you experience worsening of your admission symptoms, develop shortness of breath, life threatening emergency, suicidal or homicidal thoughts you must seek medical attention immediately by calling 911 or calling your MD immediately  if symptoms less severe.  You Must read complete instructions/literature along with all the possible adverse reactions/side effects for all the Medicines you take and that have been prescribed to you. Take any new Medicines  after you have completely understood and accept all the possible adverse reactions/side effects.   Please note  You were cared for by a hospitalist during your hospital stay. If you have any questions about your discharge medications or the care you received while you were in the hospital after you are discharged, you can call the unit and asked to speak with the hospitalist on call if the hospitalist that took care of you is not available. Once you are discharged, your primary care physician will handle any further medical issues. Please note that NO REFILLS for any discharge medications will be authorized once you are discharged, as it is imperative that you return to your primary care physician (or  establish a relationship with a primary care physician if you do not have one) for your aftercare needs so that they can reassess your need for medications and monitor your lab values.    Today   CHIEF COMPLAINT:   Chief Complaint  Patient presents with  . Fall  . Loss of Consciousness     VITAL SIGNS:  Blood pressure 132/66, pulse 77, temperature 97.9 F (36.6 C), temperature source Oral, resp. rate 18, height 5\' 9"  (1.753 m), weight 113.218 kg (249 lb 9.6 oz), SpO2 95 %.  I/O:   Intake/Output Summary (Last 24 hours) at 06/28/16 1410 Last data filed at 06/28/16 0700  Gross per 24 hour  Intake      0 ml  Output   2200 ml  Net  -2200 ml    PHYSICAL EXAMINATION:   Physical Exam  GENERAL: 70 y.o.-year-old patient lying in the bed with no acute distress.  EYES: Pupils equal, round, reactive to light and accommodation. No scleral icterus. Extraocular muscles intact.  HEENT: Head atraumatic, normocephalic. Oropharynx and nasopharynx clear. Left parietal scalp laceration- no bleeding NECK: Supple, no jugular venous distention. No thyroid enlargement, no tenderness.  LUNGS: Normal breath sounds bilaterally, no wheezing, rhonchi or crepitation. No use of accessory muscles of respiration.   CARDIOVASCULAR: S1, S2 irregular. No murmurs, rubs, or gallops.  ABDOMEN: Soft, nontender, nondistended. Bowel sounds present. No organomegaly or mass.  EXTREMITIES: No pedal edema, cyanosis, or clubbing.  NEUROLOGIC: Cranial nerves II through XII are intact. Muscle strength 5/5 in all extremities. Sensation intact. Gait not checked.  PSYCHIATRIC: The patient is alert and oriented x 3. Emotional today due to prednisone. SKIN: No obvious rash, lesion, or ulcer.   DATA REVIEW:   CBC  Recent Labs Lab 06/27/16 0353  WBC 8.7  HGB 9.0*  HCT 28.0*  PLT 338    Chemistries   Recent Labs Lab 06/28/16 0434  NA 138  K 4.0  CL 100*  CO2 32  GLUCOSE 167*  BUN 23*  CREATININE  0.58  CALCIUM 8.4*    Cardiac Enzymes  Recent Labs Lab 06/26/16 1256  TROPONINI 0.08*    Microbiology Results  Results for orders placed or performed during the hospital encounter of 04/01/16  Blood Culture (routine x 2)     Status: None   Collection Time: 04/01/16 12:44 PM  Result Value Ref Range Status   Specimen Description BLOOD RIGHT ASSIST CONTROL  Final   Special Requests BOTTLES DRAWN AEROBIC AND ANAEROBIC  10CC  Final   Culture NO GROWTH 5 DAYS  Final   Report Status 04/06/2016 FINAL  Final  Blood Culture (routine x 2)     Status: None   Collection Time: 04/01/16 12:44 PM  Result Value Ref Range Status   Specimen  Description BLOOD LEFT WRIST  Final   Special Requests   Final    BOTTLES DRAWN AEROBIC AND ANAEROBIC  AERO 13CC ANA 10CC   Culture NO GROWTH 5 DAYS  Final   Report Status 04/06/2016 FINAL  Final  Urine culture     Status: Abnormal   Collection Time: 04/01/16 12:44 PM  Result Value Ref Range Status   Specimen Description URINE, RANDOM  Final   Special Requests NONE  Final   Culture (A)  Final    80,000 COLONIES/mL ESCHERICHIA COLI ESBL-EXTENDED SPECTRUM BETA LACTAMASE-THE ORGANISM IS RESISTANT TO PENICILLINS, CEPHALOSPORINS AND AZTREONAM ACCORDING TO CLSI M100-S15 VOL.[place]. CRITICAL RESULT CALLED TO, READ BACK BY AND VERIFIED WITH: Dr. Ginette Pitman @ 1445 04/05/16 by [facility]    Report Status 04/05/2016 FINAL  Final   Organism ID, Bacteria ESCHERICHIA COLI (A)  Final      Susceptibility   Escherichia coli - MIC*    AMPICILLIN >=32 RESISTANT Resistant     CEFAZOLIN >=64 RESISTANT Resistant     CEFTRIAXONE >=64 RESISTANT Resistant     CIPROFLOXACIN >=4 RESISTANT Resistant     GENTAMICIN <=1 SENSITIVE Sensitive     IMIPENEM <=0.25 SENSITIVE Sensitive     NITROFURANTOIN <=16 SENSITIVE Sensitive     TRIMETH/SULFA >=320 RESISTANT Resistant     AMPICILLIN/SULBACTAM 16 INTERMEDIATE Intermediate     PIP/TAZO <=4 SENSITIVE Sensitive     Extended ESBL  POSITIVE Resistant     * 80,000 COLONIES/mL ESCHERICHIA COLI  Rapid Influenza A&B Antigens ([facility] only)     Status: None   Collection Time: 04/01/16 12:44 PM  Result Value Ref Range Status   Influenza A ([facility]) NEGATIVE NEGATIVE Final   Influenza B ([facility]) NEGATIVE NEGATIVE Final  MRSA PCR Screening     Status: None   Collection Time: 04/02/16  4:46 PM  Result Value Ref Range Status   MRSA by PCR NEGATIVE NEGATIVE Final    Comment:        The GeneXpert MRSA Assay (FDA approved for NASAL specimens only), is one component of a comprehensive MRSA colonization surveillance program. It is not intended to diagnose MRSA infection nor to guide or monitor treatment for MRSA infections.     RADIOLOGY:  Dg Chest 2 View  06/28/2016  CLINICAL DATA:  CHF. EXAM: CHEST  2 VIEW COMPARISON:  06/27/2016 . FINDINGS: Persistent cardiomegaly. Mild bilateral hilar fullness most likely prominent pulmonary vascularity, continued follow-up suggested . Interim slight improvement of bilateral interstitial prominence suggesting improving congestive heart failure. No pleural effusion or pneumothorax. IMPRESSION: Congestive heart failure with mild improvement of pulmonary interstitial edema. Electronically Signed   By: Marcello Moores  Register   On: 06/28/2016 07:32   Dg Chest Port 1 View  06/27/2016  CLINICAL DATA:  Hypoxia EXAM: PORTABLE CHEST 1 VIEW COMPARISON:  Chest radiograph from one day prior. FINDINGS: Stable cardiomediastinal silhouette with mild cardiomegaly. No pneumothorax. No pleural effusion. Mild to moderate pulmonary edema. IMPRESSION: Mild-to-moderate congestive heart failure. Electronically Signed   By: Ilona Sorrel M.D.   On: 06/27/2016 14:21    EKG:   Orders placed or performed during the hospital encounter of 06/25/16  . ED EKG  . ED EKG      Management plans discussed with the patient, family and they are in agreement.  CODE STATUS:     Code Status Orders        Start     Ordered    06/26/16 0107  Full code  Continuous     06/26/16 0106    Code Status History    Date Active Date Inactive Code Status Order ID Comments User Context   05/10/2016  4:38 PM 05/12/2016  4:59 PM Full Code [id number]  Theodoro Grist, MD ED   04/01/2016  5:13 PM 04/03/2016  5:58 AM Full Code [id number]  Henreitta Leber, MD Inpatient    Advance Directive Documentation        Most Recent Value   Type of Advance Directive  Healthcare Power of Attorney, Living will   Pre-existing out of facility DNR order (yellow form or pink MOST form)     "MOST" Form in Place?        TOTAL TIME TAKING CARE OF THIS PATIENT: 37 minutes.    Mickey Hebel M.D on 06/28/2016 at 2:10 PM  Between 7am to 6pm - Pager - [phone]  After 6pm go to [email or web address] - password [email or web address] [facility]  Office  [phone]  CC: Primary care physician; Dion Body, MD

## 2016-06-28 NOTE — Discharge Instructions (Signed)
Heart Failure Clinic appointment on July 04, 2016 at 11:00am with Betty [name], [place]. Please call [phone] to reschedule.  Heart Failure Heart failure means your heart has trouble pumping blood. This makes it hard for your body to work well. Heart failure is usually a long-term (chronic) condition. You must take good care of yourself and follow your doctor's treatment plan. HOME CARE  Take your heart medicine as told by your doctor.  Do not stop taking medicine unless your doctor tells you to.  Do not skip any dose of medicine.  Refill your medicines before they run out.  Take other medicines only as told by your doctor or pharmacist.  Stay active if told by your doctor. The elderly and people with severe heart failure should talk with a doctor about physical activity.  Eat heart-healthy foods. Choose foods that are without trans fat and are low in saturated fat, cholesterol, and salt (sodium). This includes fresh or frozen fruits and vegetables, fish, lean meats, fat-free or low-fat dairy foods, whole grains, and high-fiber foods. Lentils and dried peas and beans (legumes) are also good choices.  Limit salt if told by your doctor.  Cook in a healthy way. Roast, grill, broil, bake, poach, steam, or stir-fry foods.  Limit fluids as told by your doctor.  Weigh yourself every morning. Do this after you pee (urinate) and before you eat breakfast. Write down your weight to give to your doctor.  Take your blood pressure and write it down if your doctor tells you to.  Ask your doctor how to check your pulse. Check your pulse as told.  Lose weight if told by your doctor.  Stop smoking or chewing tobacco. Do not use gum or patches that help you quit without your doctor's approval.  Schedule and go to doctor visits as told.  Nonpregnant women should have no more than 1 drink a day. Men should have no more than 2 drinks a day. Talk to your doctor about drinking alcohol.  Stop illegal  drug use.  Stay current with shots (immunizations).  Manage your health conditions as told by your doctor.  Learn to manage your stress.  Rest when you are tired.  If it is really hot outside:  Avoid intense activities.  Use air conditioning or fans, or get in a cooler place.  Avoid caffeine and alcohol.  Wear loose-fitting, lightweight, and light-colored clothing.  If it is really cold outside:  Avoid intense activities.  Layer your clothing.  Wear mittens or gloves, a hat, and a scarf when going outside.  Avoid alcohol.  Learn about heart failure and get support as needed.  Get help to maintain or improve your quality of life and your ability to care for yourself as needed. GET HELP IF:   You gain weight quickly.  You are more short of breath than usual.  You cannot do your normal activities.  You tire easily.  You cough more than normal, especially with activity.  You have any or more puffiness (swelling) in areas such as your hands, feet, ankles, or belly (abdomen).  You cannot sleep because it is hard to breathe.  You feel like your heart is beating fast (palpitations).  You get dizzy or light-headed when you stand up. GET HELP RIGHT AWAY IF:   You have trouble breathing.  There is a change in mental status, such as becoming less alert or not being able to focus.  You have chest pain or discomfort.  You faint. MAKE  SURE YOU:   Understand these instructions.  Will watch your condition.  Will get help right away if you are not doing well or get worse.   This information is not intended to replace advice given to you by your health care provider. Make sure you discuss any questions you have with your health care provider.   Document Released: 09/17/2008 Document Revised: 12/30/2014 Document Reviewed: 01/25/2013 Elsevier Interactive Patient Education [employer].

## 2016-06-28 NOTE — Progress Notes (Signed)
ANTICOAGULATION CONSULT NOTE - Initial Consult  Pharmacy Consult for Warfarin  Indication: atrial fibrillation  Allergies  Allergen Reactions  . Amiodarone Other (See Comments)    Pt states that this medication causes lung bleeding.      Patient Measurements: Height: 5\' 9"  (175.3 cm) Weight: 249 lb 9.6 oz (113.218 kg) IBW/kg (Calculated) : 66.2 Heparin Dosing Weight:   Vital Signs: Temp: 97.9 F (36.6 C) (07/07 0450) Temp Source: Oral (07/07 0450) BP: 158/62 mmHg (07/07 0450) Pulse Rate: 77 (07/07 0452)  Labs:  Recent Labs  06/25/16 2049 06/26/16 0241 06/26/16 0700 06/26/16 1256 06/27/16 0353 06/28/16 0434  HGB 9.4*  --  8.9*  --  9.0*  --   HCT 29.9*  --  27.4*  --  28.0*  --   PLT 373  --  322  --  338  --   LABPROT 37.6* 33.9*  --   --  22.8* 17.2*  INR 3.94 3.44  --   --  2.03 1.39  CREATININE 0.85  --  0.74  --  0.63 0.58  TROPONINI 0.03* 0.04* 0.04* 0.08*  --   --     Estimated Creatinine Clearance: 89.1 mL/min (by C-G formula based on Cr of 0.58).   Medical History: Past Medical History  Diagnosis Date  . Headache(784.0)   . Backache, unspecified   . Herpes zoster without mention of complication   . Osteoarthrosis, unspecified whether generalized or localized, unspecified site   . Nontoxic uninodular goiter   . Obesity, unspecified   . Esophageal reflux   . Unspecified sleep apnea   . Atrial fibrillation ([place])   . Acute diastolic heart failure ([place])   . Cardiomegaly   . Hypertension     heart controlled w CHF  . Asthma   . Diabetes mellitus without complication ([place])   . Allergy   . Hx: UTI (urinary tract infection)   . Urine incontinence     hx of  . 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 COPD (chronic obstructive pulmonary disease) ([facility])     Medications:  Prescriptions prior to admission  Medication Sig Dispense Refill Last Dose  . albuterol (PROVENTIL HFA;VENTOLIN  HFA) 108 (90 Base) MCG/ACT inhaler Inhale 2 puffs into the lungs every 4 (four) hours as needed for wheezing or shortness of breath. 1 Inhaler 10 prn at prn  . azaTHIOprine (IMURAN) 50 MG tablet Take 100 mg by mouth daily.    06/25/2016 at Unknown time  . Biotin 5000 MCG CAPS Take 5,000 mcg by mouth daily.    06/25/2016 at Unknown time  . calcium-vitamin D (OSCAL WITH D) 500-200 MG-UNIT per tablet Take 1 tablet by mouth daily.   06/25/2016 at Unknown time  . diltiazem (CARDIZEM CD) 360 MG 24 hr capsule Take 360 mg by mouth daily.  4 06/25/2016 at Unknown time  . flecainide (TAMBOCOR) 100 MG tablet Take 100 mg by mouth 2 (two) times daily.  11 06/25/2016 at Unknown time  . furosemide (LASIX) 40 MG tablet Take 40 mg by mouth daily.   06/25/2016 at Unknown time  . gabapentin (NEURONTIN) 600 MG tablet Take 600 mg by mouth 2 (two) times daily.   06/25/2016 at Unknown time  . HYDROcodone-acetaminophen (NORCO/VICODIN) 5-325 MG tablet Take 1 tablet by mouth every 8 (eight) hours as needed for moderate pain.   prn at prn  . metFORMIN (GLUCOPHAGE) 1000 MG tablet Take 1,000  mg by mouth 2 (two) times daily with a meal.    06/25/2016 at Unknown time  . nitrofurantoin, macrocrystal-monohydrate, (MACROBID) 100 MG capsule Take 100 mg by mouth 2 (two) times daily.   06/25/2016 at Unknown time  . oxybutynin (DITROPAN) 5 MG tablet Take 5 mg by mouth 2 (two) times daily.   06/25/2016 at Unknown time  . pantoprazole (PROTONIX) 40 MG tablet Take 40 mg by mouth daily.   06/25/2016 at Unknown time  . rOPINIRole (REQUIP) 3 MG tablet Take 3 mg by mouth at bedtime.   06/24/2016 at Unknown time  . traZODone (DESYREL) 100 MG tablet Take 200 mg by mouth at bedtime.   06/24/2016 at Unknown time  . triamterene-hydrochlorothiazide (MAXZIDE-25) 37.5-25 MG tablet Take 1 tablet by mouth daily.   06/25/2016 at Unknown time  . Venlafaxine HCl 225 MG TB24 Take 225 mg by mouth daily.   06/25/2016 at Unknown time  . warfarin (COUMADIN) 6 MG tablet Take 6 mg by mouth  daily.   06/24/2016 at Unknown time    Assessment: 7/4 INR @ 21:00 = 3.94  7/5 INR @ 03:00 = 3.44  7/6 INR @ 03:00 = 2.03 Pt was on warfarin 6 mg PO daily.   7/7 INR @ 0400= 1.39  Goal of Therapy:  INR 2-3   Plan:  Patient received dose of warfarin 3 mg yesterday.  AM INR still subtherapeutic, but it will take several days to get back to steady state.   Will continue warfarin 3mg  dose tonight and reassess INR in AM.   Pharmacy will continue to monitor.  Roe Coombs, PharmD Clinical Pharmacist 06/28/2016 8:57 AM

## 2016-06-28 NOTE — Plan of Care (Signed)
Problem: Pain Managment: Goal: General experience of comfort will improve Outcome: Not Progressing Patient continues to have pain on left occipital region where laceration is noted.

## 2016-06-28 NOTE — Progress Notes (Signed)
Initial Heart Failure [facility] appointment scheduled for July 04, 2016 at 11:00am. Thank you.

## 2016-06-28 NOTE — Care Management (Signed)
Patient is medically stable for discharge home today with home health SN and PT.  Patient will be transitioned back outpatient physical therapy when recovers.  [facility] notified.  Appt has been made with the heart failure clinic

## 2016-07-01 ENCOUNTER — Encounter: Payer: Medicare Other | Admitting: Respiratory Therapy

## 2016-07-02 NOTE — Progress Notes (Signed)
Pulmonary Individual Treatment Plan  Patient Details  Name: Betty [name] MRN: [id number] Date of Birth: 04-24-1946 Referring Provider:        Documentation from 06/03/2016 in [facility] Cardiac and Pulmonary Rehab   Referring Provider  Dr. Wynn Maudlin      Initial Encounter Date:       Documentation from 06/03/2016 in [facility] Cardiac and Pulmonary Rehab   Date  04/30/16   Referring Provider  Dr. Wynn Maudlin      Visit Diagnosis: COPD, mild ([place])  Sleep apnea  Patient's Home Medications on Admission:  Current outpatient prescriptions:    albuterol (PROVENTIL HFA;VENTOLIN HFA) 108 (90 Base) MCG/ACT inhaler, Inhale 2 puffs into the lungs every 4 (four) hours as needed for wheezing or shortness of breath., Disp: 1 Inhaler, Rfl: 10   albuterol (PROVENTIL) (2.5 MG/3ML) 0.083% nebulizer solution, Take 3 mLs (2.5 mg total) by nebulization every 4 (four) hours as needed for wheezing or shortness of breath., Disp: 75 mL, Rfl: 12   azaTHIOprine (IMURAN) 50 MG tablet, Take 100 mg by mouth daily. , Disp: , Rfl:    azithromycin (ZITHROMAX Z-PAK) 250 MG tablet, Take 2 tabs on day 1 And then 1 tab every day until finished., Disp: 6 each, Rfl: 0   Biotin 5000 MCG CAPS, Take 5,000 mcg by mouth daily. , Disp: , Rfl:    calcium-vitamin D (OSCAL WITH D) 500-200 MG-UNIT per tablet, Take 1 tablet by mouth daily., Disp: , Rfl:    chlorpheniramine-HYDROcodone (TUSSIONEX) 10-8 MG/5ML SUER, Take 5 mLs by mouth every 12 (twelve) hours., Disp: 115 mL, Rfl: 0   diltiazem (CARDIZEM CD) 360 MG 24 hr capsule, Take 360 mg by mouth daily., Disp: , Rfl: 4   furosemide (LASIX) 40 MG tablet, Take 40 mg by mouth daily., Disp: , Rfl:    gabapentin (NEURONTIN) 600 MG tablet, Take 600 mg by mouth 2 (two) times daily., Disp: , Rfl:    HYDROcodone-acetaminophen (NORCO/VICODIN) 5-325 MG tablet, Take 1 tablet by mouth every 8 (eight) hours as needed for moderate pain., Disp: , Rfl:    metFORMIN (GLUCOPHAGE) 1000 MG  tablet, Take 1,000 mg by mouth 2 (two) times daily with a meal. , Disp: , Rfl:    oxybutynin (DITROPAN) 5 MG tablet, Take 5 mg by mouth 2 (two) times daily., Disp: , Rfl:    pantoprazole (PROTONIX) 40 MG tablet, Take 40 mg by mouth daily., Disp: , Rfl:    potassium chloride (K-DUR) 10 MEQ tablet, Take 2 tablets (20 mEq total) by mouth daily. While on lasix, Disp: 30 tablet, Rfl: 2   predniSONE (STERAPRED UNI-PAK 21 TAB) 10 MG (21) TBPK tablet, Take 1 tablet (10 mg total) by mouth daily. 4 tabs PO x 1 day 3 tabs PO x 1 day 2 tabs PO x 1 day 1 tab PO x 1 day and stop, Disp: 10 tablet, Rfl: 0   rOPINIRole (REQUIP) 3 MG tablet, Take 3 mg by mouth at bedtime., Disp: , Rfl:    traZODone (DESYREL) 100 MG tablet, Take 200 mg by mouth at bedtime., Disp: , Rfl:    Venlafaxine HCl 225 MG TB24, Take 225 mg by mouth daily., Disp: , Rfl:    warfarin (COUMADIN) 6 MG tablet, Take 6 mg by mouth daily., Disp: , Rfl:   Past Medical History: Past Medical History  Diagnosis Date   Headache(784.0)    Backache, unspecified    Herpes zoster without mention of complication    Osteoarthrosis, unspecified whether  generalized or localized, unspecified site    Nontoxic uninodular goiter    Obesity, unspecified    Esophageal reflux    Unspecified sleep apnea    Atrial fibrillation (HCC)    Acute diastolic heart failure (HCC)    Cardiomegaly    Hypertension     heart controlled w CHF   Asthma    Diabetes mellitus without complication (HCC)    Allergy    Hx: UTI (urinary tract infection)    Urine incontinence     hx of   ANCA-associated vasculitis (HCC)    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      Labs: Recent Review Flowsheet Data    Labs for ITP Cardiac and Pulmonary Rehab Latest Ref Rng 08/16/2014 03/07/2015 04/02/2016 05/10/2016 06/26/2016   Cholestrol 0 - 200 mg/dL 811 837 - - -   LDLCALC 0 - 99 mg/dL 97 90 - - -   HDL >40.02 mg/dL 14.06(V) 14.58(Q) - - -   Trlycerides 0.0 - 149.0 mg/dL 69.1 912.4 - - -   Hemoglobin A1c 4.0 - 6.0 % 5.9 6.7(H) - 6.6(H) 6.7(H)   PHART 7.350 - 7.450 - - 7.43 - -   PCO2ART 32.0 - 48.0 mmHg - - 41 - -   HCO3 21.0 - 28.0 mEq/L - - 27.2 - -   O2SAT - - - 99.4 - -       ADL UCSD:   Pulmonary Function Assessment:     Pulmonary Function Assessment - 04/30/16 1600    Pulmonary Function Tests   FVC% 84 %  10/18/2015 results   FEV1% 82 %   FEV1/FVC Ratio 76   RV% 67 %   DLCO% 56 %   Breath   Shortness of Breath No  SOB increases with shopping . Does well with daily activities of living.      Exercise Target Goals:    Exercise Program Goal: Individual exercise prescription set with THRR, safety & activity barriers. Participant demonstrates ability to understand and report RPE using BORG scale, to self-measure pulse accurately, and to acknowledge the importance of the exercise prescription.  Exercise Prescription Goal: Starting with aerobic activity 30 plus minutes a day, 3 days per week for initial exercise prescription. Provide home exercise prescription and guidelines that participant acknowledges understanding prior to discharge.  Activity Barriers & Risk Stratification:     Activity Barriers & Cardiac Risk Stratification - 04/30/16 1209    Activity Barriers & Cardiac Risk Stratification   Activity Barriers Joint Problems;Back Problems  Right ankle hurts/aches, does not limit walking activity.  Does sit down to rub it.  Takng Prednisone for her lungs and her ankle is feeling better. Back wil lhurt after certain amount of activity      6 Minute Walk:     6 Minute Walk      04/30/16 1438       6 Minute Walk   Phase  Initial     Distance 1000 feet     Walk Time 6 minutes     MPH 1.9     RPE 12     Perceived Dyspnea  2     Resting  HR 72 bpm     Resting BP 126/60 mmHg     Max Ex. HR 106 bpm     Max Ex. BP 152/70 mmHg        Initial Exercise Prescription:     Initial Exercise Prescription - 06/03/16 1400    Date of Initial Exercise RX and Referring Provider   Date 04/30/16   Referring Provider Dr. Wynn Maudlin      Perform Capillary Blood Glucose checks as needed.  Exercise Prescription Changes:     Exercise Prescription Changes      05/24/16 1400 05/27/16 1400 06/11/16 1000       Exercise Review   Progression   Yes     Response to Exercise   Blood Pressure (Admit)  140/72 mmHg 142/70 mmHg     Blood Pressure (Exercise)  132/70 mmHg 124/82 mmHg     Blood Pressure (Exit)  140/78 mmHg 138/70 mmHg     Heart Rate (Admit)  77 bpm 62 bpm     Heart Rate (Exercise)  88 bpm 107 bpm     Heart Rate (Exit)  72 bpm 58 bpm     Oxygen Saturation (Admit)  96 % 91 %     Oxygen Saturation (Exercise)  92 % 89 %     Oxygen Saturation (Exit)  96 % 93 %     Rating of Perceived Exertion (Exercise)  15 15     Perceived Dyspnea (Exercise)  3 4     Progression   Progression  Continue to progress workloads to maintain intensity without signs/symptoms of physical distress.  new THRR 108-137 Continue to progress workloads to maintain intensity without signs/symptoms of physical distress.     Resistance Training   Training Prescription Yes Yes Yes     Weight '2 2 3     '$ Reps 10-15 10-15 10-15     Oxygen   Oxygen Continuous Continuous      Liters 2 2      Treadmill   MPH 1.4 1.4 1.6     Grade 0 0 0     Minutes '15 15 15     '$ Recumbant Bike   Level 2 2      RPM 40 40      Watts 20 20      Minutes 15 15      NuStep   Level '2 2 3     '$ Watts 40 45 30     Minutes '15 15 15     '$ Recumbant Elliptical   Level 2 2      RPM 40 40      Watts 20 20      Minutes 15 15      REL-XR   Level 2 2      Watts 50  50      Minutes 15 15      T5 Nustep   Level 2 2      Watts 45 45      Minutes 15 15      Biostep-RELP   Level '3 3 2     '$ Watts 40 40 30     Minutes '15 15 15        '$ Exercise Comments:     Exercise Comments      05/29/16 1351 05/31/16 1250 06/11/16 1018 06/26/16 1353     Exercise Comments Betty [name] treadmill speed was adjusted lower to allow her to complete the session with appropriate O2 saturation levels.  Betty [name] had to go to the restroom 3 times during [facility] so she didn't exercise as much as she did her last session. Dimond continues to progress well with exercise and has increased er speed on TM. Betty [name] was seeing a Dr. for a fib 06/21/16 and will return when cleared       Discharge Exercise Prescription (Final Exercise Prescription Changes):     Exercise Prescription Changes - 06/11/16 1000    Exercise Review   Progression Yes   Response to Exercise   Blood Pressure (Admit) 142/70 mmHg   Blood Pressure (Exercise) 124/82 mmHg   Blood Pressure (Exit) 138/70 mmHg   Heart Rate (Admit) 62 bpm   Heart Rate (Exercise) 107 bpm   Heart Rate (Exit) 58 bpm   Oxygen Saturation (Admit) 91 %   Oxygen Saturation (Exercise) 89 %   Oxygen Saturation (Exit) 93 %   Rating of Perceived Exertion (Exercise) 15   Perceived Dyspnea (Exercise) 4   Progression   Progression Continue to progress workloads to maintain intensity without signs/symptoms of physical distress.   Resistance Training   Training Prescription Yes   Weight 3   Reps 10-15   Treadmill   MPH 1.6   Grade 0   Minutes 15   NuStep   Level 3   Watts 30   Minutes 15   Biostep-RELP   Level 2   Watts 30   Minutes 15       Nutrition:  Target Goals: Understanding of nutrition guidelines, daily intake of sodium '1500mg'$ , cholesterol '200mg'$ , calories 30% from fat and 7% or less from saturated fats, daily to have 5 or more servings of fruits and vegetables.  Biometrics:     Pre Biometrics - 04/30/16 1436    Pre  Biometrics   Height 5' 9.25" (1.759 m)   Weight 227 lb 4.8 oz (103.103 kg)   Waist Circumference 48.5 inches   Hip Circumference 50.5 inches   Waist to Hip Ratio 0.96 %   BMI (Calculated) 33.4       Nutrition Therapy Plan and Nutrition Goals:   Nutrition Discharge: Rate Your Plate Scores:   Psychosocial: Target Goals: Acknowledge presence or absence of depression, maximize coping skills, provide positive support system. Participant is able to verbalize types and ability to use techniques and skills needed for reducing stress and depression.  Initial Review & Psychosocial Screening:     Initial Psych Review & Screening - 04/30/16 1213    Initial Review   Current issues with Current Psychotropic Meds  Shonteria states she worries over everything. This can increase her stress levels.   Family Dynamics   Good Support System? Yes   Concerns Recent loss of significant other   Comments Husband of 3 years died 09/18/2015. "Best husband in the world"   Barriers   Psychosocial barriers to participate in program There are no identifiable barriers or psychosocial needs.;The patient should benefit from training in stress management and relaxation.   Screening Interventions   Interventions Encouraged to exercise      Quality of Life Scores:     Quality of Life - 04/30/16 1547    Quality of Life Scores   Health/Function Pre 12.86 %   Socioeconomic Pre 28 %   Psych/Spiritual Pre 18 %   Family Pre 27 %   GLOBAL Pre 18.77 %      PHQ-9:     Recent Review Flowsheet Data    Depression screen [facility] 2/9 04/30/2016  12/30/2014 10/01/2013   Decreased Interest 1 0 0   Down, Depressed, Hopeless 1 0 0   PHQ - 2 Score 2 0 0   Altered sleeping 0 - -   Tired, decreased energy 1 - -   Change in appetite 0 - -   Feeling bad or failure about yourself  0 - -   Trouble concentrating 0 - -   Moving slowly or fidgety/restless 0 - -   Suicidal thoughts 0 - -   PHQ-9 Score 3 - -   Difficult doing  work/chores Not difficult at all - -      Psychosocial Evaluation and Intervention:     Psychosocial Evaluation - 05/08/16 1243    Psychosocial Evaluation & Interventions   Interventions Encouraged to exercise with the program and follow exercise prescription   Comments Counselor met with Betty [name] today for initial psychosocial evaluation.  She is a 70 year old who has had a rough year with increased health issues including a recent hospitalization for 10 days.  She has  strong support system with a sister and her adult son and family close by.  Betty [name] is also diabetic and has sleep apnea which is being treated with a CPAP; which she reports is not currently helpful due to problems with the new mask.  Betty [name] denies a history of depression but admits she has struggled with anxiety and is on medications currently for this at bedtime only.  She has multiple stressors in her life currently including her mother recently falling and is in a nursing home; her own personal health issues and she also lost her spouse of 75 years last August - and is grieving that as well.  Counselor discussed options to help support her through this time of grief and made some recommendations to her.  Betty [name] stated she is not allowed to drive currently, which is also a stressor.  She has goals to breathe better and get stronger in general and get off the Oxygen.  Counselor will continue to follow with Betty [name] during the course of pulmonary rehab.     Continued Psychosocial Services Needed Yes  Betty [name] will benefit from a grief/loss counselor or support group.  She also needs to address her CPAP mask not working currently.        Psychosocial Re-Evaluation:  Education: Education Goals: Education classes will be provided on a weekly basis, covering required topics. Participant will state understanding/return demonstration of topics presented.  Learning Barriers/Preferences:     Learning Barriers/Preferences -  04/30/16 1212    Learning Barriers/Preferences   Learning Barriers None   Learning Preferences Individual Instruction      Education Topics: Initial Evaluation Education: - Verbal, written and demonstration of respiratory meds, RPE/PD scales, oximetry and breathing techniques. Instruction on use of nebulizers and MDIs: cleaning and proper use, rinsing mouth with steroid doses and importance of monitoring MDI activations.   General Nutrition Guidelines/Fats and Fiber: -Group instruction provided by verbal, written material, models and posters to present the general guidelines for heart healthy nutrition. Gives an explanation and review of dietary fats and fiber.          Pulmonary Rehab from 06/10/2016 in [facility] Cardiac and Pulmonary Rehab   Date  05/27/16   Educator  CR   Instruction Review Code  2- meets goals/outcomes      Controlling Sodium/Reading Food Labels: -Group verbal and written material supporting the discussion of sodium use in heart healthy  nutrition. Review and explanation with models, verbal and written materials for utilization of the food label.      Pulmonary Rehab from 06/10/2016 in [facility] Cardiac and Pulmonary Rehab   Date  06/10/16   Educator  LB   Instruction Review Code  2- meets goals/outcomes      Exercise Physiology & Risk Factors: - Group verbal and written instruction with models to review the exercise physiology of the cardiovascular system and associated critical values. Details cardiovascular disease risk factors and the goals associated with each risk factor.   Aerobic Exercise & Resistance Training: - Gives group verbal and written discussion on the health impact of inactivity. On the components of aerobic and resistive training programs and the benefits of this training and how to safely progress through these programs.   Flexibility, Balance, General Exercise Guidelines: - Provides group verbal and written instruction on the benefits of  flexibility and balance training programs. Provides general exercise guidelines with specific guidelines to those with heart or lung disease. Demonstration and skill practice provided.   Stress Management: - Provides group verbal and written instruction about the health risks of elevated stress, cause of high stress, and healthy ways to reduce stress.      Pulmonary Rehab from 06/10/2016 in [facility] Cardiac and Pulmonary Rehab   Date  05/08/16   Educator  [facility]   Instruction Review Code  2- meets goals/outcomes      Depression: - Provides group verbal and written instruction on the correlation between heart/lung disease and depressed mood, treatment options, and the stigmas associated with seeking treatment.      Pulmonary Rehab from 06/10/2016 in [facility] Cardiac and Pulmonary Rehab   Date  06/05/16   Educator  Kathreen Cornfield, [facility]   Instruction Review Code  2- meets goals/outcomes      Exercise & Equipment Safety: - Individual verbal instruction and demonstration of equipment use and safety with use of the equipment.      Pulmonary Rehab from 06/10/2016 in [facility] Cardiac and Pulmonary Rehab   Date  04/30/16   Educator  SB   Instruction Review Code  2- meets goals/outcomes      Infection Prevention: - Provides verbal and written material to individual with discussion of infection control including proper hand washing and proper equipment cleaning during exercise session.      Pulmonary Rehab from 06/10/2016 in [facility] Cardiac and Pulmonary Rehab   Date  04/30/16   Educator  SB   Instruction Review Code  2- meets goals/outcomes      Falls Prevention: - Provides verbal and written material to individual with discussion of falls prevention and safety.      Pulmonary Rehab from 06/10/2016 in [facility] Cardiac and Pulmonary Rehab   Date  04/30/16   Educator  SB   Instruction Review Code  2- meets goals/outcomes      Diabetes: - Individual verbal and written instruction to review signs/symptoms of  diabetes, desired ranges of glucose level fasting, after meals and with exercise. Advice that pre and post exercise glucose checks will be done for 3 sessions at entry of program.      Pulmonary Rehab from 06/10/2016 in [facility] Cardiac and Pulmonary Rehab   Date  04/30/16   Educator  SB   Instruction Review Code  2- meets goals/outcomes      Chronic Lung Diseases: - Group verbal and written instruction to review new updates, new respiratory medications, new advancements in procedures and treatments. Provide informative websites  and "800" numbers of self-education.   Lung Procedures: - Group verbal and written instruction to describe testing methods done to diagnose lung disease. Review the outcome of test results. Describe the treatment choices: Pulmonary Function Tests, ABGs and oximetry.   Energy Conservation: - Provide group verbal and written instruction for methods to conserve energy, plan and organize activities. Instruct on pacing techniques, use of adaptive equipment and posture/positioning to relieve shortness of breath.   Triggers: - Group verbal and written instruction to review types of environmental controls: home humidity, furnaces, filters, dust mite/pet prevention, HEPA vacuums. To discuss weather changes, air quality and the benefits of nasal washing.   Exacerbations: - Group verbal and written instruction to provide: warning signs, infection symptoms, calling MD promptly, preventive modes, and value of vaccinations. Review: effective airway clearance, coughing and/or vibration techniques. Create an Sports administrator.   Oxygen: - Individual and group verbal and written instruction on oxygen therapy. Includes supplement oxygen, available portable oxygen systems, continuous and intermittent flow rates, oxygen safety, concentrators, and Medicare reimbursement for oxygen.   Respiratory Medications: - Group verbal and written instruction to review medications for lung disease. Drug  class, frequency, complications, importance of spacers, rinsing mouth after steroid MDI's, and proper cleaning methods for nebulizers.   AED/CPR: - Group verbal and written instruction with the use of models to demonstrate the basic use of the AED with the basic ABC's of resuscitation.      Pulmonary Rehab from 06/10/2016 in [facility] Cardiac and Pulmonary Rehab   Date  05/10/16   Educator  CE   Instruction Review Code  2- meets goals/outcomes      Breathing Retraining: - Provides individuals verbal and written instruction on purpose, frequency, and proper technique of diaphragmatic breathing and pursed-lipped breathing. Applies individual practice skills.   Anatomy and Physiology of the Lungs: - Group verbal and written instruction with the use of models to provide basic lung anatomy and physiology related to function, structure and complications of lung disease.      Pulmonary Rehab from 06/10/2016 in [facility] Cardiac and Pulmonary Rehab   Date  06/07/16   Educator  [place]   Instruction Review Code  2- meets goals/outcomes      Heart Failure: - Group verbal and written instruction on the basics of heart failure: signs/symptoms, treatments, explanation of ejection fraction, enlarged heart and cardiomyopathy.   Sleep Apnea: - Individual verbal and written instruction to review Obstructive Sleep Apnea. Review of risk factors, methods for diagnosing and types of masks and machines for Betty [name].   Anxiety: - Provides group, verbal and written instruction on the correlation between heart/lung disease and anxiety, treatment options, and management of anxiety.   Relaxation: - Provides group, verbal and written instruction about the benefits of relaxation for patients with heart/lung disease. Also provides patients with examples of relaxation techniques.      Pulmonary Rehab from 06/10/2016 in [facility] Cardiac and Pulmonary Rehab   Date  05/22/16   Educator  [facility]   Instruction Review Code  2- Meets  goals/outcomes      Knowledge Questionnaire Score:     Knowledge Questionnaire Score - 04/30/16 1546    Knowledge Questionnaire Score   Pre Score 4/10       Core Components/Risk Factors/Patient Goals at Admission:     Personal Goals and Risk Factors at Admission - 04/30/16 1554    Core Components/Risk Factors/Patient Goals on Admission   Sedentary Yes   Intervention Provide advice, education, support and counseling  about physical activity/exercise needs.;Develop an individualized exercise prescription for aerobic and resistive training based on initial evaluation findings, risk stratification, comorbidities and participant's personal goals.   Expected Outcomes Achievement of increased cardiorespiratory fitness and enhanced flexibility, muscular endurance and strength shown through measurements of functional capacity and personal statement of participant.   Improve shortness of breath with ADL's Yes  SOB increased with shopping.  Scores low for daily activities of living   Intervention Provide education, individualized exercise plan and daily activity instruction to help decrease symptoms of SOB with activities of daily living.   Expected Outcomes Short Term: Achieves a reduction of symptoms when performing activities of daily living.   Increase knowledge of respiratory medications and ability to use respiratory devices properly  --  Oxygen for activity.  Has Proventil as needed,      Core Components/Risk Factors/Patient Goals Review:      Goals and Risk Factor Review      05/08/16 1540 05/22/16 1533 05/27/16 1537 06/14/16 1227     Core Components/Risk Factors/Patient Goals Review   Personal Goals Review Develop more efficient breathing techniques such as purse lipped breathing and diaphragmatic breathing and practicing self-pacing with activity.;Increase knowledge of respiratory medications and ability to use respiratory devices properly. Sedentary;Improve shortness of breath  with ADL's;Develop more efficient breathing techniques such as purse lipped breathing and diaphragmatic breathing and practicing self-pacing with activity. Increase knowledge of respiratory medications and ability to use respiratory devices properly. Weight Management/Obesity;Sedentary;Increase Strength and Stamina;Improve shortness of breath with ADL's;Develop more efficient breathing techniques such as purse lipped breathing and diaphragmatic breathing and practicing self-pacing with activity.;Diabetes    Review Reviewed PLB with Betty [name]. She had good technique, but needed queing. She needs some education on oxygen, and she would like to wean off of the oxygen. Her tubing was bent at the tank connection, so we changed cannulas.  On the Treadmill today, Betty [name]'s  O2Sat dropped to 89%. She admitted the speed was fast at 1.72mh. We explained that time was more important than speed, so we will change her exercise goal to 1.475m. Reviewed PLB and the importance of breathing through her nose - she is a mouth breather.  Increased oxygen flow to 3l/m with exercise; on 2l/m, her O2Sat's were dropping to 87-88%. She has an appointment with Dr MoRandol Kernor a [id number] assess her need for oxygen during the day, especially with activity. Betty [name]'s weight is down today.  However, she is on prednisone and trying to watch her eating.  Her blood surgars have been great!   Right now she is not walking at home due to her afib.  She does have an appt with her cardiologist next week.  She is still having shortness of breath with ADLs at home.  Discussed some energy conservation tips to help including rest breaks.  She has been taking all of her meds and using her oxygen at home on a regular  basis.  She is improving with her pursed lip breathing and starting to use it at home as well.  She is having some difficulty with the diaphragmatic breathing but is working on it in rehab.    Expected Outcomes Continue to educate Betty [name]  oxygen and look at possible weaning or the importance of wearing oxygen, especially with activity. Increase speed on the treadmill gradually with O2Sat's in the 90's. Betty [name] a treadmill at home and hope to develop [place]  and shortness of breath with her exercise goals. Continue to monitor O2Sat's with exercise goals and adjust O2 flow as needed. [place] will continue to lose weight and increase her stamina which will help with some of her shortness of breath.  We will continue to work with her to become proficient at pursed lip breathing and diaphragmatic breathing.       Core Components/Risk Factors/Patient Goals at Discharge (Final Review):      Goals and Risk Factor Review - 06/14/16 1227    Core Components/Risk Factors/Patient Goals Review   Personal Goals Review Weight Management/Obesity;Sedentary;Increase Strength and Stamina;Improve shortness of breath with ADL's;Develop more efficient breathing techniques such as purse lipped breathing and diaphragmatic breathing and practicing self-pacing with activity.;Diabetes   Review Betty [name]'s weight is down today.  However, she is on prednisone and trying to watch her eating.  Her blood surgars have been great!   Right now she is not walking at home due to her afib.  She does have an appt with her cardiologist next week.  She is still having shortness of breath with ADLs at home.  Discussed some energy conservation tips to help including rest breaks.  She has been taking all of her meds and using her oxygen at home on a regular  basis.  She is improving with her pursed lip breathing and starting to use it at home as well.  She is having some difficulty with the diaphragmatic breathing but is working on it in rehab.   Expected Outcomes Betty [name] will continue to lose weight and increase her stamina which will help with some of her shortness of breath.  We will continue to work with her to become  proficient at pursed lip breathing and diaphragmatic breathing.      ITP Comments:     ITP Comments      04/30/16 1607 05/22/16 1530 05/31/16 1251 06/03/16 1557 06/10/16 1130   ITP Comments Med review completed  Intial ITP Continue with ITP Betty [name]'s cardiologist, Dr Clayborn Bigness, is referring her to a specialist for a possible ablation. Her last ablation was in 2004. Betty [name] had to go to the restroom 3 times during [facility] so she didn't exercise as much as she did her last session. Betty [name] took her medications when she arrived at [facility]. Her chronic fib was at a HR of 132. She worked out on the NS with HRs 132-147. We sent her home to rest and called her at 4pm to check on her - HRs now back to normal 79-84. Betty [name] will meet on the 28th with the cardiologist who will be doing her ablation. She has very irregular rhythms during exercise in [facility] and is having lower O2Sat's, 89% on the 3l/m .     06/24/16 1555 06/26/16 1353 07/02/16 1559       ITP Comments Betty [name] called today and has started on a new medicine for her atrial fibrillation - Flecainide - which has made her nauseated and has not changed her atrial fib. She has a call into the physician who is hesitant to do an ablation due to her lung condition. Betty Urich was seeing a Dr about her a-fib last week and will return when cleared Betty Vine called 07/01/2016 and is adjusting to a new medication for her atrial fibrillation. She also fell over the weekend and was taken to the hospital for treatment of a heaad injury.        Comments: 30 day note review

## 2016-07-03 ENCOUNTER — Encounter: Payer: Self-pay | Admitting: Respiratory Therapy

## 2016-07-03 DIAGNOSIS — G4730 Sleep apnea, unspecified: Secondary | ICD-10-CM

## 2016-07-03 DIAGNOSIS — J449 Chronic obstructive pulmonary disease, unspecified: Secondary | ICD-10-CM

## 2016-07-03 NOTE — Progress Notes (Signed)
Called Betty [name] for an update with her fall - fortunately no injury to the head. 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 She also has an appointment about now having the cardiac ablation. Home health will be with her for 2 weeks. Betty [name] is looking forward to returning to [place] as soon as possible

## 2016-07-04 ENCOUNTER — Ambulatory Visit: Payer: Medicare Other | Admitting: Family

## 2016-07-15 ENCOUNTER — Encounter: Payer: Self-pay | Admitting: Respiratory Therapy

## 2016-07-15 DIAGNOSIS — G4730 Sleep apnea, unspecified: Secondary | ICD-10-CM

## 2016-07-15 DIAGNOSIS — J449 Chronic obstructive pulmonary disease, unspecified: Secondary | ICD-10-CM

## 2016-07-15 NOTE — Progress Notes (Signed)
Ms Paulman called and states she has an appointment with her cardiologist on 07/26/16; has doubled her lasix to 80mg  a day, and does have [place]. She plans to return as soon as possible.

## 2016-07-16 ENCOUNTER — Telehealth: Payer: Self-pay | Admitting: Internal Medicine

## 2016-07-16 DIAGNOSIS — G4733 Obstructive sleep apnea (adult) (pediatric): Secondary | ICD-10-CM

## 2016-07-16 NOTE — Telephone Encounter (Signed)
Pt states the pressure is up too high on her sleep machine, states she hasnt used her machine in 2 months. Please call.

## 2016-07-16 NOTE — Telephone Encounter (Signed)
Pt had an original setting of 19cm and we palced an order to change it to 14cm in March. Pt states pressure is to high and hasn't used machine in 2 months. Please advise.

## 2016-07-18 ENCOUNTER — Ambulatory Visit: Payer: Medicare Other | Admitting: Family

## 2016-07-18 NOTE — Telephone Encounter (Signed)
Patient is concerned b/c she has not heard back from [place] and has not used cpap in 2 months.  Please call to discuss options.

## 2016-07-18 NOTE — Telephone Encounter (Signed)
LMOVM for pt that message had been forwarded to DK and I am waiting on response. DK has been off. Will forward again to DK.  Please advise on pt's CPAP.

## 2016-07-19 NOTE — Telephone Encounter (Signed)
Per DK, place order for auto CPAP auto 5-15cm. Order placed.  LMOVM for pt in regards to order placed.

## 2016-07-24 ENCOUNTER — Telehealth: Payer: Self-pay | Admitting: *Deleted

## 2016-07-24 ENCOUNTER — Encounter: Payer: Medicare Other | Attending: Internal Medicine

## 2016-07-24 DIAGNOSIS — G4733 Obstructive sleep apnea (adult) (pediatric): Secondary | ICD-10-CM

## 2016-07-24 DIAGNOSIS — J449 Chronic obstructive pulmonary disease, unspecified: Secondary | ICD-10-CM | POA: Insufficient documentation

## 2016-07-24 NOTE — Telephone Encounter (Signed)
LMOVM for pt to return call so that I can inform her of the response from [facility]).

## 2016-07-24 NOTE — Telephone Encounter (Signed)
-----   Message from Darlina Guys sent at 07/24/2016  9:59 AM EDT ----- Good morning ladies. There was an order placed to change this pt's CPAP pressure:  "Place CPAP on auto 5-15 cm"  This pt's unit is not capable of auto mode.  Can you please enter an order for an auto Titration study 5-15 cm including the timeframe that the MD wants (ex: 2 weeks)?  With this order, we can provide the pt with a loaner and then report back to the MD with the optimal pressure.   Thank you! Melissa

## 2016-07-25 NOTE — Telephone Encounter (Signed)
LMOVM for pt to return call 

## 2016-07-26 ENCOUNTER — Other Ambulatory Visit
Admission: RE | Admit: 2016-07-26 | Discharge: 2016-07-26 | Disposition: A | Discharge status: Home / Self Care | Payer: Medicare Other | Source: Ambulatory Visit | Attending: Internal Medicine | Admitting: Internal Medicine

## 2016-07-26 DIAGNOSIS — I5031 Acute diastolic (congestive) heart failure: Secondary | ICD-10-CM | POA: Insufficient documentation

## 2016-07-26 LAB — BRAIN NATRIURETIC PEPTIDE: B Natriuretic Peptide: 504 pg/mL — ABNORMAL HIGH (ref 0.0–100.0)

## 2016-07-26 NOTE — Telephone Encounter (Signed)
LMOVM to call back 

## 2016-07-26 NOTE — Telephone Encounter (Signed)
Spoke with pt and she is aware of the order that needs to be placed. Informed [facility] will call her in regards to the order.

## 2016-07-29 ENCOUNTER — Encounter: Payer: Self-pay | Admitting: Respiratory Therapy

## 2016-07-29 DIAGNOSIS — G4730 Sleep apnea, unspecified: Secondary | ICD-10-CM

## 2016-07-29 DIAGNOSIS — J449 Chronic obstructive pulmonary disease, unspecified: Secondary | ICD-10-CM

## 2016-07-29 NOTE — Progress Notes (Signed)
Pulmonary Individual Treatment Plan  Patient Details  Name: Betty [name] MRN: [id number] Date of Birth: 1946-09-29 Referring Provider:   Flowsheet Row Documentation from 06/03/2016 in [facility] Cardiac and Pulmonary Rehab  Referring Provider  Dr. Wynn Maudlin      Initial Encounter Date:  Flowsheet Row Documentation from 06/03/2016 in [facility] Cardiac and Pulmonary Rehab  Date  04/30/16  Referring Provider  Dr. Wynn Maudlin      Visit Diagnosis: Sleep apnea  COPD, mild ([place])  Patient's Home Medications on Admission:  Current Outpatient Prescriptions:    albuterol (PROVENTIL HFA;VENTOLIN HFA) 108 (90 Base) MCG/ACT inhaler, Inhale 2 puffs into the lungs every 4 (four) hours as needed for wheezing or shortness of breath., Disp: 1 Inhaler, Rfl: 10   albuterol (PROVENTIL) (2.5 MG/3ML) 0.083% nebulizer solution, Take 3 mLs (2.5 mg total) by nebulization every 4 (four) hours as needed for wheezing or shortness of breath., Disp: 75 mL, Rfl: 12   azaTHIOprine (IMURAN) 50 MG tablet, Take 100 mg by mouth daily. , Disp: , Rfl:    azithromycin (ZITHROMAX Z-PAK) 250 MG tablet, Take 2 tabs on day 1 And then 1 tab every day until finished., Disp: 6 each, Rfl: 0   Biotin 5000 MCG CAPS, Take 5,000 mcg by mouth daily. , Disp: , Rfl:    calcium-vitamin D (OSCAL WITH D) 500-200 MG-UNIT per tablet, Take 1 tablet by mouth daily., Disp: , Rfl:    chlorpheniramine-HYDROcodone (TUSSIONEX) 10-8 MG/5ML SUER, Take 5 mLs by mouth every 12 (twelve) hours., Disp: 115 mL, Rfl: 0   diltiazem (CARDIZEM CD) 360 MG 24 hr capsule, Take 360 mg by mouth daily., Disp: , Rfl: 4   furosemide (LASIX) 40 MG tablet, Take 40 mg by mouth daily., Disp: , Rfl:    gabapentin (NEURONTIN) 600 MG tablet, Take 600 mg by mouth 2 (two) times daily., Disp: , Rfl:    HYDROcodone-acetaminophen (NORCO/VICODIN) 5-325 MG tablet, Take 1 tablet by mouth every 8 (eight) hours as needed for moderate pain., Disp: , Rfl:    metFORMIN  (GLUCOPHAGE) 1000 MG tablet, Take 1,000 mg by mouth 2 (two) times daily with a meal. , Disp: , Rfl:    oxybutynin (DITROPAN) 5 MG tablet, Take 5 mg by mouth 2 (two) times daily., Disp: , Rfl:    pantoprazole (PROTONIX) 40 MG tablet, Take 40 mg by mouth daily., Disp: , Rfl:    potassium chloride (K-DUR) 10 MEQ tablet, Take 2 tablets (20 mEq total) by mouth daily. While on lasix, Disp: 30 tablet, Rfl: 2   predniSONE (STERAPRED UNI-PAK 21 TAB) 10 MG (21) TBPK tablet, Take 1 tablet (10 mg total) by mouth daily. 4 tabs PO x 1 day 3 tabs PO x 1 day 2 tabs PO x 1 day 1 tab PO x 1 day and stop, Disp: 10 tablet, Rfl: 0   rOPINIRole (REQUIP) 3 MG tablet, Take 3 mg by mouth at bedtime., Disp: , Rfl:    traZODone (DESYREL) 100 MG tablet, Take 200 mg by mouth at bedtime., Disp: , Rfl:    Venlafaxine HCl 225 MG TB24, Take 225 mg by mouth daily., Disp: , Rfl:    warfarin (COUMADIN) 6 MG tablet, Take 6 mg by mouth daily., Disp: , Rfl:   Past Medical History: Past Medical History:  Diagnosis Date   Acute diastolic heart failure (HCC)    Allergy    ANCA-associated vasculitis (HCC)    Asthma    Atrial fibrillation (HCC)    Backache, unspecified  Cardiomegaly    COPD (chronic obstructive pulmonary disease) (HCC)    Diabetes mellitus without complication (HCC)    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     Labs: Recent Review Flowsheet Data    Labs for ITP Cardiac and Pulmonary Rehab Latest Ref Rng & Units 08/16/2014 03/07/2015 04/02/2016 05/10/2016 06/26/2016   Cholestrol 0 - 200 mg/dL 150 153 - - -   LDLCALC 0 - 99 mg/dL 97 90 - - -   HDL >39.00 mg/dL 36.30(L) 36.60(L) - - -   Trlycerides 0.0 - 149.0 mg/dL 85.0 130.0 - - -   Hemoglobin A1c 4.0 - 6.0 % 5.9 6.7(H) - 6.6(H) 6.7(H)   PHART 7.350 - 7.450 - - 7.43 - -   PCO2ART 32.0 - 48.0 mmHg - - 41 - -   HCO3 21.0 - 28.0 mEq/L - - 27.2 - -   TCO2 0 - 100 mmol/L - - - - -   O2SAT % - - 99.4 - -       ADL UCSD:   Pulmonary Function Assessment:     Pulmonary Function Assessment - 04/30/16 1600      Pulmonary Function Tests   FVC% 84 %  10/18/2015 results   FEV1% 82 %   FEV1/FVC Ratio 76   RV% 67 %   DLCO% 56 %     Breath   Shortness of Breath No  SOB increases with shopping . Does well with daily activities of living.      Exercise Target Goals:    Exercise Program Goal: Individual exercise prescription set with THRR, safety & activity barriers. Participant demonstrates ability to understand and report RPE using BORG scale, to self-measure pulse accurately, and to acknowledge the importance of the exercise prescription.  Exercise Prescription Goal: Starting with aerobic activity 30 plus minutes a day, 3 days per week for initial exercise prescription. Provide home exercise prescription and guidelines that participant acknowledges understanding prior to discharge.  Activity Barriers & Risk Stratification:     Activity Barriers & Cardiac Risk Stratification - 04/30/16 1209      Activity Barriers & Cardiac Risk Stratification   Activity Barriers Joint Problems;Back Problems  Right ankle hurts/aches, does not limit walking activity.  Does sit down to rub it.  Takng Prednisone for her lungs and her ankle is feeling better. Back wil lhurt after certain amount of activity      6 Minute Walk:     6  Minute Walk    Row Name 04/30/16 1438         6 Minute Walk   Phase Initial     Distance 1000 feet     Walk Time 6 minutes     MPH 1.9     RPE 12  Perceived Dyspnea  2     Resting HR 72 bpm     Resting BP 126/60     Max Ex. HR 106 bpm     Max Ex. BP 152/70        Initial Exercise Prescription:     Initial Exercise Prescription - 06/03/16 1400      Date of Initial Exercise RX and Referring Provider   Date 04/30/16   Referring Provider Dr. Wynn Maudlin      Perform Capillary Blood Glucose checks as needed.  Exercise Prescription Changes:     Exercise Prescription Changes    Row Name 05/24/16 1400 05/27/16 1400 06/11/16 1000         Exercise Review   Progression  --  -- Yes       Response to Exercise   Blood Pressure (Admit)  -- 140/72 142/70     Blood Pressure (Exercise)  -- 132/70 124/82     Blood Pressure (Exit)  -- 140/78 138/70     Heart Rate (Admit)  -- 77 bpm 62 bpm     Heart Rate (Exercise)  -- 88 bpm 107 bpm     Heart Rate (Exit)  -- 72 bpm 58 bpm     Oxygen Saturation (Admit)  -- 96 % 91 %     Oxygen Saturation (Exercise)  -- 92 % 89 %     Oxygen Saturation (Exit)  -- 96 % 93 %     Rating of Perceived Exertion (Exercise)  -- 15 15     Perceived Dyspnea (Exercise)  -- 3 4       Progression   Progression  -- Continue to progress workloads to maintain intensity without signs/symptoms of physical distress.  new THRR 108-137 Continue to progress workloads to maintain intensity without signs/symptoms of physical distress.       Resistance Training   Training Prescription Yes Yes Yes     Weight _0 Reps 10-15 10-15 10-15       Oxygen   Oxygen Continuous Continuous  --     Liters 2 2  --       Treadmill   MPH 1.4 1.4 1.6     Grade 0 0 0     Minutes _1 Recumbant Bike   Level 2 2  --     RPM 40 40  --     Watts 20 20  --     Minutes 15 15  --       NuStep   Level _2 Watts 40 45 30     Minutes _3 Recumbant Elliptical   Level 2 2  --     RPM 40 40  --     Watts 20 20  --     Minutes 15 15  --       REL-XR   Level 2 2  --     Watts 50 50  --     Minutes 15 15  --       T5 Nustep   Level 2 2  --     Watts 45 45  --     Minutes 15 15  --       Biostep-RELP   Level _4 Watts 40 40 30  Minutes _0 Exercise Comments:     Exercise Comments    Row Name 05/29/16 1351 05/31/16 1250 06/11/16 1018 06/26/16 1353 07/11/16 1308   Exercise Comments Susans treadmill speed was adjusted lower to allow her to complete the session with appropriate O2 saturation levels. Shonica had to go to the restroom 3 times during [facility] so she didn't exercise as much as she did her last session. Zannie continues to progress well with exercise and has increased er speed on TM. Ms Kenan was seeing a Dr. for a fib 06/21/16 and will return when cleared Ms Zecca hasnt been to [place] since 06/14/16.  She has had other medical issues as noted and will return when able.   [place] Name 07/26/16 1017           Exercise Comments Ms Dipinto has not attended since 06/14/16          Discharge Exercise Prescription (Final Exercise Prescription Changes):     Exercise Prescription Changes - 06/11/16 1000      Exercise Review   Progression Yes     Response to Exercise   Blood Pressure (Admit) 142/70   Blood Pressure (Exercise) 124/82   Blood Pressure (Exit) 138/70   Heart Rate (Admit) 62 bpm   Heart Rate (Exercise) 107 bpm   Heart Rate (Exit) 58 bpm   Oxygen Saturation (Admit) 91 %   Oxygen Saturation (Exercise) 89 %   Oxygen Saturation (Exit) 93 %   Rating of Perceived Exertion (Exercise) 15   Perceived Dyspnea (Exercise) 4     Progression   Progression Continue to progress workloads to maintain intensity without signs/symptoms of physical distress.     Resistance Training   Training Prescription Yes   Weight 3   Reps 10-15     Treadmill   MPH 1.6   Grade 0   Minutes 15     NuStep    Level 3   Watts 30   Minutes 15     Biostep-RELP   Level 2   Watts 30   Minutes 15       Nutrition:  Target Goals: Understanding of nutrition guidelines, daily intake of sodium <1518m, cholesterol <2040m calories 30% from fat and 7% or less from saturated fats, daily to have 5 or more servings of fruits and vegetables.  Biometrics:     Pre Biometrics - 04/30/16 1436      Pre Biometrics   Height 5' 9.25" (1.759 m)   Weight 227 lb 4.8 oz (103.1 kg)   Waist Circumference 48.5 inches   Hip Circumference 50.5 inches   Waist to Hip Ratio 0.96 %   BMI (Calculated) 33.4       Nutrition Therapy Plan and Nutrition Goals:   Nutrition Discharge: Rate Your Plate Scores:   Psychosocial: Target Goals: Acknowledge presence or absence of depression, maximize coping skills, provide positive support system. Participant is able to verbalize types and ability to use techniques and skills needed for reducing stress and depression.  Initial Review & Psychosocial Screening:     Initial Psych Review & Screening - 04/30/16 1213      Initial Review   Current issues with Current Psychotropic Meds  SuTiersatates she worries over everything. This can increase her stress levels.     Family Dynamics   Good Support System? Yes   Concerns Recent loss of significant other   Comments Husband of [id number] died Au09-05-24  2016. "Best husband in the world"     Barriers   Psychosocial barriers to participate in program There are no identifiable barriers or psychosocial needs.;The patient should benefit from training in stress management and relaxation.     Screening Interventions   Interventions Encouraged to exercise      Quality of Life Scores:     Quality of Life - 04/30/16 1547      Quality of Life Scores   Health/Function Pre 12.86 %   Socioeconomic Pre 28 %   Psych/Spiritual Pre 18 %   Family Pre 27 %   GLOBAL Pre 18.77 %      PHQ-9: Recent Review Flowsheet Data     Depression screen [facility] 2/9 04/30/2016 12/30/2014 10/01/2013   Decreased Interest 1 0 0   Down, Depressed, Hopeless 1 0 0   PHQ - 2 Score 2 0 0   Altered sleeping 0 - -   Tired, decreased energy 1 - -   Change in appetite 0 - -   Feeling bad or failure about yourself  0 - -   Trouble concentrating 0 - -   Moving slowly or fidgety/restless 0 - -   Suicidal thoughts 0 - -   PHQ-9 Score 3 - -   Difficult doing work/chores Not difficult at all - -      Psychosocial Evaluation and Intervention:     Psychosocial Evaluation - 05/08/16 1243      Psychosocial Evaluation & Interventions   Interventions Encouraged to exercise with the program and follow exercise prescription   Comments Counselor met with Ms. Bauch today for initial psychosocial evaluation.  She is a 70 year old who has had a rough year with increased health issues including a recent hospitalization for 10 days.  She has  strong support system with a sister and her adult son and family close by.  Ms. Korber is also diabetic and has sleep apnea which is being treated with a CPAP; which she reports is not currently helpful due to problems with the new mask.  Ms. Bergstresser denies a history of depression but admits she has struggled with anxiety and is on medications currently for this at bedtime only.  She has multiple stressors in her life currently including her mother recently falling and is in a nursing home; her own personal health issues and she also lost her spouse of 36 years last August - and is grieving that as well.  Counselor discussed options to help support her through this time of grief and made some recommendations to her.  Ms. Illescas stated she is not allowed to drive currently, which is also a stressor.  She has goals to breathe better and get stronger in general and get off the Oxygen.  Counselor will continue to follow with Ms. Vaquera during the course of pulmonary rehab.     Continued Psychosocial Services Needed Yes  Ms. Mihalic will  benefit from a grief/loss counselor or support group.  She also needs to address her CPAP mask not working currently.        Psychosocial Re-Evaluation:     Psychosocial Re-Evaluation    Row Name 07/29/16 [phone]             Psychosocial Re-Evaluation   Comments Ms Lick has been absent from [place] since 06/14/2016 due to medical reasons. I have talked with her several times and she is very anxious to return to [place], for it has been a very postive experience, both  mentally and physically.         Education: Education Goals: Education classes will be provided on a weekly basis, covering required topics. Participant will state understanding/return demonstration of topics presented.  Learning Barriers/Preferences:     Learning Barriers/Preferences - 04/30/16 1212      Learning Barriers/Preferences   Learning Barriers None   Learning Preferences Individual Instruction      Education Topics: Initial Evaluation Education: - Verbal, written and demonstration of respiratory meds, RPE/PD scales, oximetry and breathing techniques. Instruction on use of nebulizers and MDIs: cleaning and proper use, rinsing mouth with steroid doses and importance of monitoring MDI activations.   General Nutrition Guidelines/Fats and Fiber: -Group instruction provided by verbal, written material, models and posters to present the general guidelines for heart healthy nutrition. Gives an explanation and review of dietary fats and fiber. Flowsheet Row Pulmonary Rehab from 06/10/2016 in [facility] Cardiac and Pulmonary Rehab  Date  05/27/16  Educator  CR  Instruction Review Code  2- meets goals/outcomes      Controlling Sodium/Reading Food Labels: -Group verbal and written material supporting the discussion of sodium use in heart healthy nutrition. Review and explanation with models, verbal and written materials for utilization of the food label. Flowsheet Row Pulmonary Rehab from 06/10/2016 in [facility] Cardiac  and Pulmonary Rehab  Date  06/10/16  Educator  LB  Instruction Review Code  2- meets goals/outcomes      Exercise Physiology & Risk Factors: - Group verbal and written instruction with models to review the exercise physiology of the cardiovascular system and associated critical values. Details cardiovascular disease risk factors and the goals associated with each risk factor.   Aerobic Exercise & Resistance Training: - Gives group verbal and written discussion on the health impact of inactivity. On the components of aerobic and resistive training programs and the benefits of this training and how to safely progress through these programs.   Flexibility, Balance, General Exercise Guidelines: - Provides group verbal and written instruction on the benefits of flexibility and balance training programs. Provides general exercise guidelines with specific guidelines to those with heart or lung disease. Demonstration and skill practice provided.   Stress Management: - Provides group verbal and written instruction about the health risks of elevated stress, cause of high stress, and healthy ways to reduce stress. Flowsheet Row Pulmonary Rehab from 06/10/2016 in [facility] Cardiac and Pulmonary Rehab  Date  05/08/16  Educator  [facility]  Instruction Review Code  2- meets goals/outcomes      Depression: - Provides group verbal and written instruction on the correlation between heart/lung disease and depressed mood, treatment options, and the stigmas associated with seeking treatment. Flowsheet Row Pulmonary Rehab from 06/10/2016 in [facility] Cardiac and Pulmonary Rehab  Date  06/05/16  Educator  Kathreen Cornfield, [facility]  Instruction Review Code  2- meets goals/outcomes      Exercise & Equipment Safety: - Individual verbal instruction and demonstration of equipment use and safety with use of the equipment. Flowsheet Row Pulmonary Rehab from 06/10/2016 in [facility] Cardiac and Pulmonary Rehab  Date  04/30/16  Educator  SB   Instruction Review Code  2- meets goals/outcomes      Infection Prevention: - Provides verbal and written material to individual with discussion of infection control including proper hand washing and proper equipment cleaning during exercise session. Flowsheet Row Pulmonary Rehab from 06/10/2016 in [facility] Cardiac and Pulmonary Rehab  Date  04/30/16  Educator  SB  Instruction Review Code  2- meets  goals/outcomes      Falls Prevention: - Provides verbal and written material to individual with discussion of falls prevention and safety. Flowsheet Row Pulmonary Rehab from 06/10/2016 in [facility] Cardiac and Pulmonary Rehab  Date  04/30/16  Educator  SB  Instruction Review Code  2- meets goals/outcomes      Diabetes: - Individual verbal and written instruction to review signs/symptoms of diabetes, desired ranges of glucose level fasting, after meals and with exercise. Advice that pre and post exercise glucose checks will be done for 3 sessions at entry of program. Flowsheet Row Pulmonary Rehab from 06/10/2016 in [facility] Cardiac and Pulmonary Rehab  Date  04/30/16  Educator  SB  Instruction Review Code  2- meets goals/outcomes      Chronic Lung Diseases: - Group verbal and written instruction to review new updates, new respiratory medications, new advancements in procedures and treatments. Provide informative websites and "800" numbers of self-education.   Lung Procedures: - Group verbal and written instruction to describe testing methods done to diagnose lung disease. Review the outcome of test results. Describe the treatment choices: Pulmonary Function Tests, ABGs and oximetry.   Energy Conservation: - Provide group verbal and written instruction for methods to conserve energy, plan and organize activities. Instruct on pacing techniques, use of adaptive equipment and posture/positioning to relieve shortness of breath.   Triggers: - Group verbal and written instruction to review types of  environmental controls: home humidity, furnaces, filters, dust mite/pet prevention, HEPA vacuums. To discuss weather changes, air quality and the benefits of nasal washing.   Exacerbations: - Group verbal and written instruction to provide: warning signs, infection symptoms, calling MD promptly, preventive modes, and value of vaccinations. Review: effective airway clearance, coughing and/or vibration techniques. Create an Sports administrator.   Oxygen: - Individual and group verbal and written instruction on oxygen therapy. Includes supplement oxygen, available portable oxygen systems, continuous and intermittent flow rates, oxygen safety, concentrators, and Medicare reimbursement for oxygen.   Respiratory Medications: - Group verbal and written instruction to review medications for lung disease. Drug class, frequency, complications, importance of spacers, rinsing mouth after steroid MDI's, and proper cleaning methods for nebulizers.   AED/CPR: - Group verbal and written instruction with the use of models to demonstrate the basic use of the AED with the basic ABC's of resuscitation. Flowsheet Row Pulmonary Rehab from 06/10/2016 in [facility] Cardiac and Pulmonary Rehab  Date  05/10/16  Educator  CE  Instruction Review Code  2- meets goals/outcomes      Breathing Retraining: - Provides individuals verbal and written instruction on purpose, frequency, and proper technique of diaphragmatic breathing and pursed-lipped breathing. Applies individual practice skills.   Anatomy and Physiology of the Lungs: - Group verbal and written instruction with the use of models to provide basic lung anatomy and physiology related to function, structure and complications of lung disease. Flowsheet Row Pulmonary Rehab from 06/10/2016 in [facility] Cardiac and Pulmonary Rehab  Date  06/07/16  Educator  [place]  Instruction Review Code  2- meets goals/outcomes      Heart Failure: - Group verbal and written instruction on the  basics of heart failure: signs/symptoms, treatments, explanation of ejection fraction, enlarged heart and cardiomyopathy.   Sleep Apnea: - Individual verbal and written instruction to review Obstructive Sleep Apnea. Review of risk factors, methods for diagnosing and types of masks and machines for OSA.   Anxiety: - Provides group, verbal and written instruction on the correlation between heart/lung disease and anxiety, treatment options,  and management of anxiety.   Relaxation: - Provides group, verbal and written instruction about the benefits of relaxation for patients with heart/lung disease. Also provides patients with examples of relaxation techniques. Flowsheet Row Pulmonary Rehab from 06/10/2016 in [facility] Cardiac and Pulmonary Rehab  Date  05/22/16  Educator  [facility]  Instruction Review Code  2- Meets goals/outcomes      Knowledge Questionnaire Score:     Knowledge Questionnaire Score - 04/30/16 1546      Knowledge Questionnaire Score   Pre Score 4/10       Core Components/Risk Factors/Patient Goals at Admission:     Personal Goals and Risk Factors at Admission - 04/30/16 1554      Core Components/Risk Factors/Patient Goals on Admission   Sedentary Yes   Intervention Provide advice, education, support and counseling about physical activity/exercise needs.;Develop an individualized exercise prescription for aerobic and resistive training based on initial evaluation findings, risk stratification, comorbidities and participant's personal goals.   Expected Outcomes Achievement of increased cardiorespiratory fitness and enhanced flexibility, muscular endurance and strength shown through measurements of functional capacity and personal statement of participant.   Improve shortness of breath with ADL's Yes  SOB increased with shopping.  Scores low for daily activities of living   Intervention Provide education, individualized exercise plan and daily activity instruction to help  decrease symptoms of SOB with activities of daily living.   Expected Outcomes Short Term: Achieves a reduction of symptoms when performing activities of daily living.   Increase knowledge of respiratory medications and ability to use respiratory devices properly  --  Oxygen for activity.  Has Proventil as needed,      Core Components/Risk Factors/Patient Goals Review:      Goals and Risk Factor Review    Row Name 05/08/16 1540 05/22/16 1533 05/27/16 1537 06/14/16 1227       Core Components/Risk Factors/Patient Goals Review   Personal Goals Review Develop more efficient breathing techniques such as purse lipped breathing and diaphragmatic breathing and practicing self-pacing with activity.;Increase knowledge of respiratory medications and ability to use respiratory devices properly. Sedentary;Improve shortness of breath with ADL's;Develop more efficient breathing techniques such as purse lipped breathing and diaphragmatic breathing and practicing self-pacing with activity. Increase knowledge of respiratory medications and ability to use respiratory devices properly. Weight Management/Obesity;Sedentary;Increase Strength and Stamina;Improve shortness of breath with ADL's;Develop more efficient breathing techniques such as purse lipped breathing and diaphragmatic breathing and practicing self-pacing with activity.;Diabetes    Review Reviewed PLB with Ms Mctague. She had good technique, but needed queing. She needs some education on oxygen, and she would like to wean off of the oxygen. Her tubing was bent at the tank connection, so we changed cannulas.  On the Treadmill today, Ms Kuyper's  O2Sat dropped to 89%. She admitted the speed was fast at 1.57mh. We explained that time was more important than speed, so we will change her exercise goal to 1.424m. Reviewed PLB and the importance of breathing through her nose - she is a mouth breather.  Increased oxygen flow to 3l/m with exercise; on 2l/m, her O2Sat's  were dropping to 87-88%. She has an appointment with Dr MoRandol Kernor a [id number] assess her need for oxygen during the day, especially with activity. Vianey's weight is down today.  However, she is on prednisone and trying to watch her eating.  Her blood surgars have been great!   Right now she is not walking at home due to her afib.  She does have  an appt with her cardiologist next week.  She is still having shortness of breath with ADLs at home.  Discussed some energy conservation tips to help including rest breaks.  She has been taking all of her meds and using her oxygen at home on a regular  basis.  She is improving with her pursed lip breathing and starting to use it at home as well.  She is having some difficulty with the diaphragmatic breathing but is working on it in rehab.    Expected Outcomes Continue to educate Ms Grilli on oxygen and look at possible weaning or the importance of wearing oxygen, especially with activity. Increase speed on the treadmill gradually with O2Sat's in the 90's. Ms Stum has a treadmill at home and hope to develop a home plan for discharge from [place]. Continue monitoring PLB and shortness of breath with her exercise goals. Continue to monitor O2Sat's with exercise goals and adjust O2 flow as needed. Jandy will continue to lose weight and increase her stamina which will help with some of her shortness of breath.  We will continue to work with her to become proficient at pursed lip breathing and diaphragmatic breathing.       Core Components/Risk Factors/Patient Goals at Discharge (Final Review):      Goals and Risk Factor Review - 06/14/16 1227      Core Components/Risk Factors/Patient Goals Review   Personal Goals Review Weight Management/Obesity;Sedentary;Increase Strength and Stamina;Improve shortness of breath with ADL's;Develop more efficient breathing techniques such as purse lipped breathing and diaphragmatic breathing and practicing self-pacing with  activity.;Diabetes   Review Faron's weight is down today.  However, she is on prednisone and trying to watch her eating.  Her blood surgars have been great!   Right now she is not walking at home due to her afib.  She does have an appt with her cardiologist next week.  She is still having shortness of breath with ADLs at home.  Discussed some energy conservation tips to help including rest breaks.  She has been taking all of her meds and using her oxygen at home on a regular  basis.  She is improving with her pursed lip breathing and starting to use it at home as well.  She is having some difficulty with the diaphragmatic breathing but is working on it in rehab.   Expected Outcomes Elaysha will continue to lose weight and increase her stamina which will help with some of her shortness of breath.  We will continue to work with her to become proficient at pursed lip breathing and diaphragmatic breathing.      ITP Comments:     ITP Comments    Row Name 04/30/16 1607 05/22/16 1530 05/31/16 1251 06/03/16 1557 06/10/16 1130   ITP Comments Med review completed  Intial ITP Continue with ITP MS Sobalvarro's cardiologist, Dr Clayborn Bigness, is referring her to a specialist for a possible ablation. Her last ablation was in 2004. Jasilyn had to go to the restroom 3 times during [facility] so she didn't exercise as much as she did her last session. Ms Weigel took her medications when she arrived at [facility]. Her chronic fib was at a HR of 132. She worked out on the NS with HRs 132-147. We sent her home to rest and called her at 4pm to check on her - HRs now back to normal 79-84. Ms Anthis will meet on the 28th with the cardiologist who will be doing her ablation. She has very irregular  rhythms during exercise in [facility] and is having lower O2Sat's, 89% on the 3l/m .   [place] Name 06/24/16 1555 06/26/16 1353 07/02/16 1559 07/03/16 0846 07/11/16 1309   ITP Comments Ms Clippinger called today and has started on a new medicine for her atrial  fibrillation - Flecainide - which has made her nauseated and has not changed her atrial fib. She has a call into the physician who is hesitant to do an ablation due to her lung condition. Ms Tarnow was seeing a Dr about her a-fib last week and will return when cleared Ms Kuhnert called 07/01/2016 and is adjusting to a new medication for her atrial fibrillation. She also fell over the weekend and was taken to the hospital for treatment of a heaad injury. Called Ms Virgo for an update with her fall - fortunately no injury to the head. 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 She also has an appointment about now having the cardiac ablation. Home health will be with her for 2 weeks. Ms Bartee is looking forward to returning to [place] as soon as possible. Ms Spearman hasnt been to [facility] since 06/14/16.  She has had other medical issues as noted and will return when able.   [place] Name 07/15/16 0944 07/26/16 1017         ITP Comments Ms Rozzell called with an update - still receiving [place]; doubled her fluid pill to 50m; and has appointment with cardiologist on 07/26/16. Ms OBiggshas not attended since 06/14/16         Comments: Ms OGamerohas been out since 06/14/16 with medical concerns. I have talked with her several times, and she is anxious to return to [place] soon as possible. The exercise is very positive experience for her.

## 2016-07-31 ENCOUNTER — Telehealth: Payer: Self-pay | Admitting: Respiratory Therapy

## 2016-07-31 ENCOUNTER — Encounter: Payer: Self-pay | Admitting: Respiratory Therapy

## 2016-07-31 DIAGNOSIS — G4730 Sleep apnea, unspecified: Secondary | ICD-10-CM

## 2016-07-31 DIAGNOSIS — J449 Chronic obstructive pulmonary disease, unspecified: Secondary | ICD-10-CM

## 2016-08-01 ENCOUNTER — Telehealth: Payer: Self-pay | Admitting: Family

## 2016-08-01 NOTE — Telephone Encounter (Signed)
Returned patient's call regarding her appointment scheduled on 08/06/16. Patient says that she's transferring her cardiology care to [facility] and her cardiologist at [facility], Dr. Marcello Moores, told her that she didn't need to come here, that she needed to go to [facility]. Advised patient that I would go ahead and cancel her appointment with [place]. Dr. Saralyn Pilar who referred patient to [place] was informed of patient's decision.

## 2016-08-06 ENCOUNTER — Ambulatory Visit: Payer: Medicare Other | Admitting: Family

## 2016-08-08 DIAGNOSIS — J449 Chronic obstructive pulmonary disease, unspecified: Secondary | ICD-10-CM

## 2016-08-08 DIAGNOSIS — G4730 Sleep apnea, unspecified: Secondary | ICD-10-CM

## 2016-08-12 ENCOUNTER — Telehealth: Payer: Self-pay | Admitting: Respiratory Therapy

## 2016-08-12 ENCOUNTER — Encounter: Payer: Self-pay | Admitting: Respiratory Therapy

## 2016-08-12 DIAGNOSIS — J449 Chronic obstructive pulmonary disease, unspecified: Secondary | ICD-10-CM

## 2016-08-12 DIAGNOSIS — G4730 Sleep apnea, unspecified: Secondary | ICD-10-CM

## 2016-08-27 ENCOUNTER — Encounter: Payer: Self-pay | Admitting: Respiratory Therapy

## 2016-08-27 NOTE — Progress Notes (Signed)
Pulmonary Individual Treatment Plan  Patient Details  Name: Betty [name] MRN: [id number] Date of Birth: [birth year] Referring Provider:   Arn Medal Row Documentation from 06/03/2016 in [facility] Cardiac and Pulmonary Rehab  Referring Provider  Dr. Wynn Maudlin      Initial Encounter Date:  Flowsheet Row Documentation from 06/03/2016 in [facility] Cardiac and Pulmonary Rehab  Date  04/30/16  Referring Provider  Dr. Wynn Maudlin      Visit Diagnosis: No diagnosis found.  Patient's Home Medications on Admission:  Current Outpatient Prescriptions:    albuterol (PROVENTIL HFA;VENTOLIN HFA) 108 (90 Base) MCG/ACT inhaler, Inhale 2 puffs into the lungs every 4 (four) hours as needed for wheezing or shortness of breath., Disp: 1 Inhaler, Rfl: 10   albuterol (PROVENTIL) (2.5 MG/3ML) 0.083% nebulizer solution, Take 3 mLs (2.5 mg total) by nebulization every 4 (four) hours as needed for wheezing or shortness of breath., Disp: 75 mL, Rfl: 12   azaTHIOprine (IMURAN) 50 MG tablet, Take 100 mg by mouth daily. , Disp: , Rfl:    azithromycin (ZITHROMAX Z-PAK) 250 MG tablet, Take 2 tabs on day 1 And then 1 tab every day until finished., Disp: 6 each, Rfl: 0   Biotin 5000 MCG CAPS, Take 5,000 mcg by mouth daily. , Disp: , Rfl:    calcium-vitamin D (OSCAL WITH D) 500-200 MG-UNIT per tablet, Take 1 tablet by mouth daily., Disp: , Rfl:    chlorpheniramine-HYDROcodone (TUSSIONEX) 10-8 MG/5ML SUER, Take 5 mLs by mouth every 12 (twelve) hours., Disp: 115 mL, Rfl: 0   diltiazem (CARDIZEM CD) 360 MG 24 hr capsule, Take 360 mg by mouth daily., Disp: , Rfl: 4   furosemide (LASIX) 40 MG tablet, Take 40 mg by mouth daily., Disp: , Rfl:    gabapentin (NEURONTIN) 600 MG tablet, Take 600 mg by mouth 2 (two) times daily., Disp: , Rfl:    HYDROcodone-acetaminophen (NORCO/VICODIN) 5-325 MG tablet, Take 1 tablet by mouth every 8 (eight) hours as needed for moderate pain., Disp: , Rfl:    metFORMIN (GLUCOPHAGE) 1000 MG  tablet, Take 1,000 mg by mouth 2 (two) times daily with a meal. , Disp: , Rfl:    oxybutynin (DITROPAN) 5 MG tablet, Take 5 mg by mouth 2 (two) times daily., Disp: , Rfl:    pantoprazole (PROTONIX) 40 MG tablet, Take 40 mg by mouth daily., Disp: , Rfl:    potassium chloride (K-DUR) 10 MEQ tablet, Take 2 tablets (20 mEq total) by mouth daily. While on lasix, Disp: 30 tablet, Rfl: 2   predniSONE (STERAPRED UNI-PAK 21 TAB) 10 MG (21) TBPK tablet, Take 1 tablet (10 mg total) by mouth daily. 4 tabs PO x 1 day 3 tabs PO x 1 day 2 tabs PO x 1 day 1 tab PO x 1 day and stop, Disp: 10 tablet, Rfl: 0   rOPINIRole (REQUIP) 3 MG tablet, Take 3 mg by mouth at bedtime., Disp: , Rfl:    traZODone (DESYREL) 100 MG tablet, Take 200 mg by mouth at bedtime., Disp: , Rfl:    Venlafaxine HCl 225 MG TB24, Take 225 mg by mouth daily., Disp: , Rfl:    warfarin (COUMADIN) 6 MG tablet, Take 6 mg by mouth daily., Disp: , Rfl:   Past Medical History: Past Medical History:  Diagnosis Date   Acute diastolic heart failure (HCC)    Allergy    ANCA-associated vasculitis (HCC)    Asthma    Atrial fibrillation (HCC)    Backache, unspecified  Cardiomegaly    COPD (chronic obstructive pulmonary disease) (HCC)    Diabetes mellitus without complication (HCC)    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      Labs: Recent Review Flowsheet Data    Labs for ITP Cardiac and Pulmonary Rehab Latest Ref Rng & Units 08/16/2014 03/07/2015 04/02/2016 05/10/2016 06/26/2016   Cholestrol 0 - 200 mg/dL 150 153 - - -   LDLCALC 0 - 99 mg/dL 97 90 - - -   HDL >39.00 mg/dL 36.30(L) 36.60(L) - - -   Trlycerides 0.0 - 149.0 mg/dL 85.0 130.0 - - -   Hemoglobin A1c 4.0 - 6.0 % 5.9 6.7(H) - 6.6(H) 6.7(H)   PHART 7.350 - 7.450 - - 7.43 - -   PCO2ART 32.0 - 48.0 mmHg - - 41 - -   HCO3 21.0 - 28.0 mEq/L - - 27.2 - -   TCO2 0 - 100 mmol/L - - - - -   O2SAT % - - 99.4 - -       ADL UCSD:   Pulmonary Function Assessment:     Pulmonary Function Assessment - 04/30/16 1600      Pulmonary Function Tests   FVC% 84 %  10/18/2015 results   FEV1% 82 %   FEV1/FVC Ratio 76   RV% 67 %   DLCO% 56 %     Breath   Shortness of Breath No  SOB increases with shopping . Does well with daily activities of living.      Exercise Target Goals:    Exercise Program Goal: Individual exercise prescription set with THRR, safety & activity barriers. Participant demonstrates ability to understand and report RPE using BORG scale, to self-measure pulse accurately, and to acknowledge the importance of the exercise prescription.  Exercise Prescription Goal: Starting with aerobic activity 30 plus minutes a day, 3 days per week for initial exercise prescription. Provide home exercise prescription and guidelines that participant acknowledges understanding prior to discharge.  Activity Barriers & Risk Stratification:     Activity Barriers & Cardiac Risk Stratification - 04/30/16 1209      Activity Barriers & Cardiac Risk Stratification   Activity Barriers Joint Problems;Back Problems  Right ankle hurts/aches, does not limit walking activity.  Does sit down to rub it.  Takng Prednisone for her lungs and her ankle is feeling better. Back wil lhurt after certain amount of activity      6 Minute Walk:     6 Minute Walk    Row  Name 04/30/16 1438         6 Minute Walk   Phase Initial     Distance 1000 feet     Walk Time 6 minutes     MPH 1.9     RPE 12  Perceived Dyspnea  2     Resting HR 72 bpm     Resting BP 126/60     Max Ex. HR 106 bpm     Max Ex. BP 152/70        Initial Exercise Prescription:     Initial Exercise Prescription - 06/03/16 1400      Date of Initial Exercise RX and Referring Provider   Date 04/30/16   Referring Provider Dr. Wynn Maudlin      Perform Capillary Blood Glucose checks as needed.  Exercise Prescription Changes:     Exercise Prescription Changes    Row Name 05/24/16 1400 05/27/16 1400 06/11/16 1000         Exercise Review   Progression  --  -- Yes       Response to Exercise   Blood Pressure (Admit)  -- 140/72 142/70     Blood Pressure (Exercise)  -- 132/70 124/82     Blood Pressure (Exit)  -- 140/78 138/70     Heart Rate (Admit)  -- 77 bpm 62 bpm     Heart Rate (Exercise)  -- 88 bpm 107 bpm     Heart Rate (Exit)  -- 72 bpm 58 bpm     Oxygen Saturation (Admit)  -- 96 % 91 %     Oxygen Saturation (Exercise)  -- 92 % 89 %     Oxygen Saturation (Exit)  -- 96 % 93 %     Rating of Perceived Exertion (Exercise)  -- 15 15     Perceived Dyspnea (Exercise)  -- 3 4       Progression   Progression  -- Continue to progress workloads to maintain intensity without signs/symptoms of physical distress.  new THRR 108-137 Continue to progress workloads to maintain intensity without signs/symptoms of physical distress.       Resistance Training   Training Prescription Yes Yes Yes     Weight '2 2 3     '$ Reps 10-15 10-15 10-15       Oxygen   Oxygen Continuous Continuous  --     Liters 2 2  --       Treadmill   MPH 1.4 1.4 1.6     Grade 0 0 0     Minutes '15 15 15       '$ Recumbant Bike   Level 2 2  --     RPM 40 40  --     Watts 20 20  --     Minutes 15 15  --       NuStep   Level '2 2 3     '$ Watts 40 45 30     Minutes '15 15 15       '$ Recumbant  Elliptical   Level 2 2  --     RPM 40 40  --     Watts 20 20  --     Minutes 15 15  --       REL-XR   Level 2 2  --     Watts 50 50  --     Minutes 15 15  --       T5 Nustep   Level 2 2  --     Watts 45 45  --     Minutes 15 15  --       Biostep-RELP   Level '3 3 2     '$ Watts 40 40 30  Minutes '15 15 15        '$ Exercise Comments:     Exercise Comments    Row Name 05/29/16 1351 05/31/16 1250 06/11/16 1018 06/26/16 1353 07/11/16 1308   Exercise Comments Susans treadmill speed was adjusted lower to allow her to complete the session with appropriate O2 saturation levels. Elnora had to go to the restroom 3 times during [facility] so she didn't exercise as much as she did her last session. Orchid continues to progress well with exercise and has increased er speed on TM. Ms Pillard was seeing a Dr. for a fib 06/21/16 and will return when cleared Ms Walsworth hasnt been to [facility] since 06/14/16.  She has had other medical issues as noted and will return when able.   Row Name 07/26/16 1017 08/08/16 1310 08/21/16 1450       Exercise Comments Ms Velie has not attended since 06/14/16 Ms Hardgrave has not attended [facility] since 06/14/16. Ms Reinhold was admitted to [facility] 8/16 for a heart valve problem.  She has not attended [facility] since 06/14/16.        Discharge Exercise Prescription (Final Exercise Prescription Changes):     Exercise Prescription Changes - 06/11/16 1000      Exercise Review   Progression Yes     Response to Exercise   Blood Pressure (Admit) 142/70   Blood Pressure (Exercise) 124/82   Blood Pressure (Exit) 138/70   Heart Rate (Admit) 62 bpm   Heart Rate (Exercise) 107 bpm   Heart Rate (Exit) 58 bpm   Oxygen Saturation (Admit) 91 %   Oxygen Saturation (Exercise) 89 %   Oxygen Saturation (Exit) 93 %   Rating of Perceived Exertion (Exercise) 15   Perceived Dyspnea (Exercise) 4     Progression   Progression Continue to progress workloads to maintain intensity without signs/symptoms of  physical distress.     Resistance Training   Training Prescription Yes   Weight 3   Reps 10-15     Treadmill   MPH 1.6   Grade 0   Minutes 15     NuStep   Level 3   Watts 30   Minutes 15     Biostep-RELP   Level 2   Watts 30   Minutes 15       Nutrition:  Target Goals: Understanding of nutrition guidelines, daily intake of sodium '1500mg'$ , cholesterol '200mg'$ , calories 30% from fat and 7% or less from saturated fats, daily to have 5 or more servings of fruits and vegetables.  Biometrics:     Pre Biometrics - 04/30/16 1436      Pre Biometrics   Height 5' 9.25" (1.759 m)   Weight 227 lb 4.8 oz (103.1 kg)   Waist Circumference 48.5 inches   Hip Circumference 50.5 inches   Waist to Hip Ratio 0.96 %   BMI (Calculated) 33.4       Nutrition Therapy Plan and Nutrition Goals:   Nutrition Discharge: Rate Your Plate Scores:   Psychosocial: Target Goals: Acknowledge presence or absence of depression, maximize coping skills, provide positive support system. Participant is able to verbalize types and ability to use techniques and skills needed for reducing stress and depression.  Initial Review & Psychosocial Screening:     Initial Psych Review & Screening - 04/30/16 1213      Initial Review   Current issues with Current Psychotropic Meds  Shakiyah states she worries over everything. This can increase her stress levels.  Family Dynamics   Good Support System? Yes   Concerns Recent loss of significant other   Comments Husband of 38 years died 07-Sep-2015. "Best husband in the world"     Barriers   Psychosocial barriers to participate in program There are no identifiable barriers or psychosocial needs.;The patient should benefit from training in stress management and relaxation.     Screening Interventions   Interventions Encouraged to exercise      Quality of Life Scores:     Quality of Life - 04/30/16 1547      Quality of Life Scores   Health/Function  Pre 12.86 %   Socioeconomic Pre 28 %   Psych/Spiritual Pre 18 %   Family Pre 27 %   GLOBAL Pre 18.77 %      PHQ-9: Recent Review Flowsheet Data    Depression screen [facility] 2/9 04/30/2016 12/30/2014 10/01/2013   Decreased Interest 1 0 0   Down, Depressed, Hopeless 1 0 0   PHQ - 2 Score 2 0 0   Altered sleeping 0 - -   Tired, decreased energy 1 - -   Change in appetite 0 - -   Feeling bad or failure about yourself  0 - -   Trouble concentrating 0 - -   Moving slowly or fidgety/restless 0 - -   Suicidal thoughts 0 - -   PHQ-9 Score 3 - -   Difficult doing work/chores Not difficult at all - -      Psychosocial Evaluation and Intervention:     Psychosocial Evaluation - 05/08/16 1243      Psychosocial Evaluation & Interventions   Interventions Encouraged to exercise with the program and follow exercise prescription   Comments Counselor met with Ms. Nazario today for initial psychosocial evaluation.  She is a 70 year old who has had a rough year with increased health issues including a recent hospitalization for 10 days.  She has  strong support system with a sister and her adult son and family close by.  Ms. Naef is also diabetic and has sleep apnea which is being treated with a CPAP; which she reports is not currently helpful due to problems with the new mask.  Ms. Boissonneault denies a history of depression but admits she has struggled with anxiety and is on medications currently for this at bedtime only.  She has multiple stressors in her life currently including her mother recently falling and is in a nursing home; her own personal health issues and she also lost her spouse of 89 years last August - and is grieving that as well.  Counselor discussed options to help support her through this time of grief and made some recommendations to her.  Ms. Hilgeman stated she is not allowed to drive currently, which is also a stressor.  She has goals to breathe better and get stronger in general and get off the  Oxygen.  Counselor will continue to follow with Ms. Veenstra during the course of pulmonary rehab.     Continued Psychosocial Services Needed Yes  Ms. Torry will benefit from a grief/loss counselor or support group.  She also needs to address her CPAP mask not working currently.        Psychosocial Re-Evaluation:     Psychosocial Re-Evaluation    Row Name 07/29/16 [phone]             Psychosocial Re-Evaluation   Comments Ms Belmar has been absent from [facility] since 06/14/2016 due to medical  reasons. I have talked with her several times and she is very anxious to return to [facility], for it has been a very postive experience, both mentally and physically.         Education: Education Goals: Education classes will be provided on a weekly basis, covering required topics. Participant will state understanding/return demonstration of topics presented.  Learning Barriers/Preferences:     Learning Barriers/Preferences - 04/30/16 1212      Learning Barriers/Preferences   Learning Barriers None   Learning Preferences Individual Instruction      Education Topics: Initial Evaluation Education: - Verbal, written and demonstration of respiratory meds, RPE/PD scales, oximetry and breathing techniques. Instruction on use of nebulizers and MDIs: cleaning and proper use, rinsing mouth with steroid doses and importance of monitoring MDI activations.   General Nutrition Guidelines/Fats and Fiber: -Group instruction provided by verbal, written material, models and posters to present the general guidelines for heart healthy nutrition. Gives an explanation and review of dietary fats and fiber. Flowsheet Row Pulmonary Rehab from 06/10/2016 in [facility] Cardiac and Pulmonary Rehab  Date  05/27/16  Educator  CR  Instruction Review Code  2- meets goals/outcomes      Controlling Sodium/Reading Food Labels: -Group verbal and written material supporting the discussion of sodium use in heart healthy nutrition.  Review and explanation with models, verbal and written materials for utilization of the food label. Flowsheet Row Pulmonary Rehab from 06/10/2016 in [facility] Cardiac and Pulmonary Rehab  Date  06/10/16  Educator  LB  Instruction Review Code  2- meets goals/outcomes      Exercise Physiology & Risk Factors: - Group verbal and written instruction with models to review the exercise physiology of the cardiovascular system and associated critical values. Details cardiovascular disease risk factors and the goals associated with each risk factor.   Aerobic Exercise & Resistance Training: - Gives group verbal and written discussion on the health impact of inactivity. On the components of aerobic and resistive training programs and the benefits of this training and how to safely progress through these programs.   Flexibility, Balance, General Exercise Guidelines: - Provides group verbal and written instruction on the benefits of flexibility and balance training programs. Provides general exercise guidelines with specific guidelines to those with heart or lung disease. Demonstration and skill practice provided.   Stress Management: - Provides group verbal and written instruction about the health risks of elevated stress, cause of high stress, and healthy ways to reduce stress. Flowsheet Row Pulmonary Rehab from 06/10/2016 in [facility] Cardiac and Pulmonary Rehab  Date  05/08/16  Educator  [facility]  Instruction Review Code  2- meets goals/outcomes      Depression: - Provides group verbal and written instruction on the correlation between heart/lung disease and depressed mood, treatment options, and the stigmas associated with seeking treatment. Flowsheet Row Pulmonary Rehab from 06/10/2016 in [facility] Cardiac and Pulmonary Rehab  Date  06/05/16  Educator  Kathreen Cornfield, [facility]  Instruction Review Code  2- meets goals/outcomes      Exercise & Equipment Safety: - Individual verbal instruction and demonstration of  equipment use and safety with use of the equipment. Flowsheet Row Pulmonary Rehab from 06/10/2016 in [facility] Cardiac and Pulmonary Rehab  Date  04/30/16  Educator  SB  Instruction Review Code  2- meets goals/outcomes      Infection Prevention: - Provides verbal and written material to individual with discussion of infection control including proper hand washing and proper equipment cleaning during exercise session. Flowsheet  Row Pulmonary Rehab from 06/10/2016 in [facility] Cardiac and Pulmonary Rehab  Date  04/30/16  Educator  SB  Instruction Review Code  2- meets goals/outcomes      Falls Prevention: - Provides verbal and written material to individual with discussion of falls prevention and safety. Flowsheet Row Pulmonary Rehab from 06/10/2016 in [facility] Cardiac and Pulmonary Rehab  Date  04/30/16  Educator  SB  Instruction Review Code  2- meets goals/outcomes      Diabetes: - Individual verbal and written instruction to review signs/symptoms of diabetes, desired ranges of glucose level fasting, after meals and with exercise. Advice that pre and post exercise glucose checks will be done for 3 sessions at entry of program. Flowsheet Row Pulmonary Rehab from 06/10/2016 in [facility] Cardiac and Pulmonary Rehab  Date  04/30/16  Educator  SB  Instruction Review Code  2- meets goals/outcomes      Chronic Lung Diseases: - Group verbal and written instruction to review new updates, new respiratory medications, new advancements in procedures and treatments. Provide informative websites and "800" numbers of self-education.   Lung Procedures: - Group verbal and written instruction to describe testing methods done to diagnose lung disease. Review the outcome of test results. Describe the treatment choices: Pulmonary Function Tests, ABGs and oximetry.   Energy Conservation: - Provide group verbal and written instruction for methods to conserve energy, plan and organize activities. Instruct on pacing  techniques, use of adaptive equipment and posture/positioning to relieve shortness of breath.   Triggers: - Group verbal and written instruction to review types of environmental controls: home humidity, furnaces, filters, dust mite/pet prevention, HEPA vacuums. To discuss weather changes, air quality and the benefits of nasal washing.   Exacerbations: - Group verbal and written instruction to provide: warning signs, infection symptoms, calling MD promptly, preventive modes, and value of vaccinations. Review: effective airway clearance, coughing and/or vibration techniques. Create an Sports administrator.   Oxygen: - Individual and group verbal and written instruction on oxygen therapy. Includes supplement oxygen, available portable oxygen systems, continuous and intermittent flow rates, oxygen safety, concentrators, and Medicare reimbursement for oxygen.   Respiratory Medications: - Group verbal and written instruction to review medications for lung disease. Drug class, frequency, complications, importance of spacers, rinsing mouth after steroid MDI's, and proper cleaning methods for nebulizers.   AED/CPR: - Group verbal and written instruction with the use of models to demonstrate the basic use of the AED with the basic ABC's of resuscitation. Flowsheet Row Pulmonary Rehab from 06/10/2016 in [facility] Cardiac and Pulmonary Rehab  Date  05/10/16  Educator  CE  Instruction Review Code  2- meets goals/outcomes      Breathing Retraining: - Provides individuals verbal and written instruction on purpose, frequency, and proper technique of diaphragmatic breathing and pursed-lipped breathing. Applies individual practice skills.   Anatomy and Physiology of the Lungs: - Group verbal and written instruction with the use of models to provide basic lung anatomy and physiology related to function, structure and complications of lung disease. Flowsheet Row Pulmonary Rehab from 06/10/2016 in [facility] Cardiac and  Pulmonary Rehab  Date  06/07/16  Educator  [place]  Instruction Review Code  2- meets goals/outcomes      Heart Failure: - Group verbal and written instruction on the basics of heart failure: signs/symptoms, treatments, explanation of ejection fraction, enlarged heart and cardiomyopathy.   Sleep Apnea: - Individual verbal and written instruction to review Obstructive Sleep Apnea. Review of risk factors, methods for diagnosing and types  of masks and machines for OSA.   Anxiety: - Provides group, verbal and written instruction on the correlation between heart/lung disease and anxiety, treatment options, and management of anxiety.   Relaxation: - Provides group, verbal and written instruction about the benefits of relaxation for patients with heart/lung disease. Also provides patients with examples of relaxation techniques. Flowsheet Row Pulmonary Rehab from 06/10/2016 in [facility] Cardiac and Pulmonary Rehab  Date  05/22/16  Educator  [facility]  Instruction Review Code  2- Meets goals/outcomes      Knowledge Questionnaire Score:     Knowledge Questionnaire Score - 04/30/16 1546      Knowledge Questionnaire Score   Pre Score 4/10       Core Components/Risk Factors/Patient Goals at Admission:     Personal Goals and Risk Factors at Admission - 04/30/16 1554      Core Components/Risk Factors/Patient Goals on Admission   Sedentary Yes   Intervention Provide advice, education, support and counseling about physical activity/exercise needs.;Develop an individualized exercise prescription for aerobic and resistive training based on initial evaluation findings, risk stratification, comorbidities and participant's personal goals.   Expected Outcomes Achievement of increased cardiorespiratory fitness and enhanced flexibility, muscular endurance and strength shown through measurements of functional capacity and personal statement of participant.   Improve shortness of breath with ADL's Yes  SOB  increased with shopping.  Scores low for daily activities of living   Intervention Provide education, individualized exercise plan and daily activity instruction to help decrease symptoms of SOB with activities of daily living.   Expected Outcomes Short Term: Achieves a reduction of symptoms when performing activities of daily living.   Increase knowledge of respiratory medications and ability to use respiratory devices properly  --  Oxygen for activity.  Has Proventil as needed,      Core Components/Risk Factors/Patient Goals Review:      Goals and Risk Factor Review    Row Name 05/08/16 1540 05/22/16 1533 05/27/16 1537 06/14/16 1227       Core Components/Risk Factors/Patient Goals Review   Personal Goals Review Develop more efficient breathing techniques such as purse lipped breathing and diaphragmatic breathing and practicing self-pacing with activity.;Increase knowledge of respiratory medications and ability to use respiratory devices properly. Sedentary;Improve shortness of breath with ADL's;Develop more efficient breathing techniques such as purse lipped breathing and diaphragmatic breathing and practicing self-pacing with activity. Increase knowledge of respiratory medications and ability to use respiratory devices properly. Weight Management/Obesity;Sedentary;Increase Strength and Stamina;Improve shortness of breath with ADL's;Develop more efficient breathing techniques such as purse lipped breathing and diaphragmatic breathing and practicing self-pacing with activity.;Diabetes    Review Reviewed PLB with Ms Sellers. She had good technique, but needed queing. She needs some education on oxygen, and she would like to wean off of the oxygen. Her tubing was bent at the tank connection, so we changed cannulas.  On the Treadmill today, Ms Curenton's  O2Sat dropped to 89%. She admitted the speed was fast at 1.24mh. We explained that time was more important than speed, so we will change her exercise goal  to 1.447m. Reviewed PLB and the importance of breathing through her nose - she is a mouth breather.  Increased oxygen flow to 3l/m with exercise; on 2l/m, her O2Sat's were dropping to 87-88%. She has an appointment with Dr MoRandol Kernor a [id number] assess her need for oxygen during the day, especially with activity. Ceira's weight is down today.  However, she is on prednisone and trying to watch her  eating.  Her blood surgars have been great!   Right now she is not walking at home due to her afib.  She does have an appt with her cardiologist next week.  She is still having shortness of breath with ADLs at home.  Discussed some energy conservation tips to help including rest breaks.  She has been taking all of her meds and using her oxygen at home on a regular  basis.  She is improving with her pursed lip breathing and starting to use it at home as well.  She is having some difficulty with the diaphragmatic breathing but is working on it in rehab.    Expected Outcomes Continue to educate Ms Sago on oxygen and look at possible weaning or the importance of wearing oxygen, especially with activity. Increase speed on the treadmill gradually with O2Sat's in the 90's. Ms Glennie has a treadmill at home and hope to develop a home plan for discharge from [facility]. Continue monitoring PLB and shortness of breath with her exercise goals. Continue to monitor O2Sat's with exercise goals and adjust O2 flow as needed. Jacqueleen will continue to lose weight and increase her stamina which will help with some of her shortness of breath.  We will continue to work with her to become proficient at pursed lip breathing and diaphragmatic breathing.       Core Components/Risk Factors/Patient Goals at Discharge (Final Review):      Goals and Risk Factor Review - 06/14/16 1227      Core Components/Risk Factors/Patient Goals Review   Personal Goals Review Weight Management/Obesity;Sedentary;Increase Strength and Stamina;Improve shortness  of breath with ADL's;Develop more efficient breathing techniques such as purse lipped breathing and diaphragmatic breathing and practicing self-pacing with activity.;Diabetes   Review Weslie's weight is down today.  However, she is on prednisone and trying to watch her eating.  Her blood surgars have been great!   Right now she is not walking at home due to her afib.  She does have an appt with her cardiologist next week.  She is still having shortness of breath with ADLs at home.  Discussed some energy conservation tips to help including rest breaks.  She has been taking all of her meds and using her oxygen at home on a regular  basis.  She is improving with her pursed lip breathing and starting to use it at home as well.  She is having some difficulty with the diaphragmatic breathing but is working on it in rehab.   Expected Outcomes Miamor will continue to lose weight and increase her stamina which will help with some of her shortness of breath.  We will continue to work with her to become proficient at pursed lip breathing and diaphragmatic breathing.      ITP Comments:     ITP Comments    Row Name 04/30/16 1607 05/22/16 1530 05/31/16 1251 06/03/16 1557 06/10/16 1130   ITP Comments Med review completed  Intial ITP Continue with ITP MS Sharrow's cardiologist, Dr Juliann Pares, is referring her to a specialist for a possible ablation. Her last ablation was in 2004. Gwendolynn had to go to the restroom 3 times during [facility] so she didn't exercise as much as she did her last session. Ms Weigel took her medications when she arrived at [facility]. Her chronic fib was at a HR of 132. She worked out on the NS with HRs 132-147. We sent her home to rest and called her at 4pm to check on her -  HRs now back to normal 79-84. Ms Fullman will meet on the 28th with the cardiologist who will be doing her ablation. She has very irregular rhythms during exercise in [facility] and is having lower O2Sat's, 89% on the 3l/m .   [place] Name 06/24/16  1555 06/26/16 1353 07/02/16 1559 07/03/16 0846 07/11/16 1309   ITP Comments Ms Marschke called today and has started on a new medicine for her atrial fibrillation - Flecainide - which has made her nauseated and has not changed her atrial fib. She has a call into the physician who is hesitant to do an ablation due to her lung condition. Ms Gerstel was seeing a Dr about her a-fib last week and will return when cleared Ms Ruggiero called 07/01/2016 and is adjusting to a new medication for her atrial fibrillation. She also fell over the weekend and was taken to the hospital for treatment of a heaad injury. Called Ms Linzy for an update with her fall - fortunately no injury to the head. 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 She also has an appointment about now having the cardiac ablation. Home health will be with her for 2 weeks. Ms Dooley is looking forward to returning to [place] as soon as possible. Ms Karow hasnt been to [facility] since 06/14/16.  She has had other medical issues as noted and will return when able.   [place] Name 07/15/16 0944 07/26/16 1017 07/31/16 1447 07/31/16 1449 08/08/16 1311   ITP Comments Ms Humble called with an update - still receiving [place]; doubled her fluid pill to '80mg'$ ; and has appointment with cardiologist on 07/26/16. Ms Buzan has not attended since 06/14/16 Called Ms Samara - she now has a CHF diagnosis, and her laxis has doubled to 80 mg/day. . She [facility] and does plann to return to [place]. Called Ms Sorn - she now has a CHF diagnosis, and her laxis has doubled to 80 mg/day. . She goes to the [facility] next Tuesday and does plan to return to [place]. Ms Stegner has not attended [facility] since 06/14/16.   Row Name 08/12/16 1554           ITP Comments Ms Mocarski called from [facility] where she was admitted last Wednesday, 16th. She is under the care of Dr. Tammi Klippel, a cardiologist and  was admitted for shortness of breath. She has lost 11lbs and will have a cardiac cath today.           Comments: 30 day note review

## 2016-09-09 ENCOUNTER — Encounter: Payer: Self-pay | Admitting: Internal Medicine

## 2016-09-11 ENCOUNTER — Telehealth: Payer: Self-pay

## 2016-09-11 NOTE — Telephone Encounter (Signed)
Betty [name] states she is having open heart surgery for valve replacement tomorrow at [facility].  Will consider discharge from [facility] and her starting [facility] instead.

## 2016-09-17 ENCOUNTER — Encounter: Payer: Self-pay | Admitting: Respiratory Therapy

## 2016-09-17 NOTE — Progress Notes (Signed)
Pulmonary Individual Treatment Plan  Patient Details  Name: Betty [name] MRN: [id number] Date of Birth: Jan 18, 1946 Referring Provider:   Arn Medal Row Documentation from 06/03/2016 in [facility] Cardiac and Pulmonary Rehab  Referring Provider  Dr. Wynn Maudlin      Initial Encounter Date:  Flowsheet Row Documentation from 06/03/2016 in [facility] Cardiac and Pulmonary Rehab  Date  04/30/16  Referring Provider  Dr. Wynn Maudlin      Visit Diagnosis: No diagnosis found.  Patient's Home Medications on Admission:  Current Outpatient Prescriptions:    albuterol (PROVENTIL HFA;VENTOLIN HFA) 108 (90 Base) MCG/ACT inhaler, Inhale 2 puffs into the lungs every 4 (four) hours as needed for wheezing or shortness of breath., Disp: 1 Inhaler, Rfl: 10   albuterol (PROVENTIL) (2.5 MG/3ML) 0.083% nebulizer solution, Take 3 mLs (2.5 mg total) by nebulization every 4 (four) hours as needed for wheezing or shortness of breath., Disp: 75 mL, Rfl: 12   azaTHIOprine (IMURAN) 50 MG tablet, Take 100 mg by mouth daily. , Disp: , Rfl:    azithromycin (ZITHROMAX Z-PAK) 250 MG tablet, Take 2 tabs on day 1 And then 1 tab every day until finished., Disp: 6 each, Rfl: 0   Biotin 5000 MCG CAPS, Take 5,000 mcg by mouth daily. , Disp: , Rfl:    calcium-vitamin D (OSCAL WITH D) 500-200 MG-UNIT per tablet, Take 1 tablet by mouth daily., Disp: , Rfl:    chlorpheniramine-HYDROcodone (TUSSIONEX) 10-8 MG/5ML SUER, Take 5 mLs by mouth every 12 (twelve) hours., Disp: 115 mL, Rfl: 0   diltiazem (CARDIZEM CD) 360 MG 24 hr capsule, Take 360 mg by mouth daily., Disp: , Rfl: 4   furosemide (LASIX) 40 MG tablet, Take 40 mg by mouth daily., Disp: , Rfl:    gabapentin (NEURONTIN) 600 MG tablet, Take 600 mg by mouth 2 (two) times daily., Disp: , Rfl:    HYDROcodone-acetaminophen (NORCO/VICODIN) 5-325 MG tablet, Take 1 tablet by mouth every 8 (eight) hours as needed for moderate pain., Disp: , Rfl:    metFORMIN (GLUCOPHAGE) 1000 MG  tablet, Take 1,000 mg by mouth 2 (two) times daily with a meal. , Disp: , Rfl:    oxybutynin (DITROPAN) 5 MG tablet, Take 5 mg by mouth 2 (two) times daily., Disp: , Rfl:    pantoprazole (PROTONIX) 40 MG tablet, Take 40 mg by mouth daily., Disp: , Rfl:    potassium chloride (K-DUR) 10 MEQ tablet, Take 2 tablets (20 mEq total) by mouth daily. While on lasix, Disp: 30 tablet, Rfl: 2   predniSONE (STERAPRED UNI-PAK 21 TAB) 10 MG (21) TBPK tablet, Take 1 tablet (10 mg total) by mouth daily. 4 tabs PO x 1 day 3 tabs PO x 1 day 2 tabs PO x 1 day 1 tab PO x 1 day and stop, Disp: 10 tablet, Rfl: 0   rOPINIRole (REQUIP) 3 MG tablet, Take 3 mg by mouth at bedtime., Disp: , Rfl:    traZODone (DESYREL) 100 MG tablet, Take 200 mg by mouth at bedtime., Disp: , Rfl:    Venlafaxine HCl 225 MG TB24, Take 225 mg by mouth daily., Disp: , Rfl:    warfarin (COUMADIN) 6 MG tablet, Take 6 mg by mouth daily., Disp: , Rfl:   Past Medical History: Past Medical History:  Diagnosis Date   Acute diastolic heart failure (HCC)    Allergy    ANCA-associated vasculitis (HCC)    Asthma    Atrial fibrillation (HCC)    Backache, unspecified  Cardiomegaly    COPD (chronic obstructive pulmonary disease) (HCC)    Diabetes mellitus without complication (HCC)    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      Labs: Recent Review Flowsheet Data    Labs for ITP Cardiac and Pulmonary Rehab Latest Ref Rng & Units 08/16/2014 03/07/2015 04/02/2016 05/10/2016 06/26/2016   Cholestrol 0 - 200 mg/dL 150 153 - - -   LDLCALC 0 - 99 mg/dL 97 90 - - -   HDL >39.00 mg/dL 36.30(L) 36.60(L) - - -   Trlycerides 0.0 - 149.0 mg/dL 85.0 130.0 - - -   Hemoglobin A1c 4.0 - 6.0 % 5.9 6.7(H) - 6.6(H) 6.7(H)   PHART 7.350 - 7.450 - - 7.43 - -   PCO2ART 32.0 - 48.0 mmHg - - 41 - -   HCO3 21.0 - 28.0 mEq/L - - 27.2 - -   TCO2 0 - 100 mmol/L - - - - -   O2SAT % - - 99.4 - -       ADL UCSD:   Pulmonary Function Assessment:     Pulmonary Function Assessment - 04/30/16 1600      Pulmonary Function Tests   FVC% 84 %  10/18/2015 results   FEV1% 82 %   FEV1/FVC Ratio 76   RV% 67 %   DLCO% 56 %     Breath   Shortness of Breath No  SOB increases with shopping . Does well with daily activities of living.      Exercise Target Goals:    Exercise Program Goal: Individual exercise prescription set with THRR, safety & activity barriers. Participant demonstrates ability to understand and report RPE using BORG scale, to self-measure pulse accurately, and to acknowledge the importance of the exercise prescription.  Exercise Prescription Goal: Starting with aerobic activity 30 plus minutes a day, 3 days per week for initial exercise prescription. Provide home exercise prescription and guidelines that participant acknowledges understanding prior to discharge.  Activity Barriers & Risk Stratification:     Activity Barriers & Cardiac Risk Stratification - 04/30/16 1209      Activity Barriers & Cardiac Risk Stratification   Activity Barriers Joint Problems;Back Problems  Right ankle hurts/aches, does not limit walking activity.  Does sit down to rub it.  Takng Prednisone for her lungs and her ankle is feeling better. Back wil lhurt after certain amount of activity      6 Minute Walk:     6 Minute Walk    Row  Name 04/30/16 1438         6 Minute Walk   Phase Initial     Distance 1000 feet     Walk Time 6 minutes     MPH 1.9     RPE 12  Perceived Dyspnea  2     Resting HR 72 bpm     Resting BP 126/60     Max Ex. HR 106 bpm     Max Ex. BP 152/70        Initial Exercise Prescription:     Initial Exercise Prescription - 06/03/16 1400      Date of Initial Exercise RX and Referring Provider   Date 04/30/16   Referring Provider Dr. Wynn Maudlin      Perform Capillary Blood Glucose checks as needed.  Exercise Prescription Changes:     Exercise Prescription Changes    Row Name 05/24/16 1400 05/27/16 1400 06/11/16 1000         Exercise Review   Progression  --  -- Yes       Response to Exercise   Blood Pressure (Admit)  -- 140/72 142/70     Blood Pressure (Exercise)  -- 132/70 124/82     Blood Pressure (Exit)  -- 140/78 138/70     Heart Rate (Admit)  -- 77 bpm 62 bpm     Heart Rate (Exercise)  -- 88 bpm 107 bpm     Heart Rate (Exit)  -- 72 bpm 58 bpm     Oxygen Saturation (Admit)  -- 96 % 91 %     Oxygen Saturation (Exercise)  -- 92 % 89 %     Oxygen Saturation (Exit)  -- 96 % 93 %     Rating of Perceived Exertion (Exercise)  -- 15 15     Perceived Dyspnea (Exercise)  -- 3 4       Progression   Progression  -- Continue to progress workloads to maintain intensity without signs/symptoms of physical distress.  new THRR 108-137 Continue to progress workloads to maintain intensity without signs/symptoms of physical distress.       Resistance Training   Training Prescription Yes Yes Yes     Weight '2 2 3     '$ Reps 10-15 10-15 10-15       Oxygen   Oxygen Continuous Continuous  --     Liters 2 2  --       Treadmill   MPH 1.4 1.4 1.6     Grade 0 0 0     Minutes '15 15 15       '$ Recumbant Bike   Level 2 2  --     RPM 40 40  --     Watts 20 20  --     Minutes 15 15  --       NuStep   Level '2 2 3     '$ Watts 40 45 30     Minutes '15 15 15       '$ Recumbant  Elliptical   Level 2 2  --     RPM 40 40  --     Watts 20 20  --     Minutes 15 15  --       REL-XR   Level 2 2  --     Watts 50 50  --     Minutes 15 15  --       T5 Nustep   Level 2 2  --     Watts 45 45  --     Minutes 15 15  --       Biostep-RELP   Level '3 3 2     '$ Watts 40 40 30  Minutes '15 15 15        '$ Exercise Comments:     Exercise Comments    Row Name 05/29/16 1351 05/31/16 1250 06/11/16 1018 06/26/16 1353 07/11/16 1308   Exercise Comments Susans treadmill speed was adjusted lower to allow her to complete the session with appropriate O2 saturation levels. Shanley had to go to the restroom 3 times during [facility] so she didn't exercise as much as she did her last session. Loredana continues to progress well with exercise and has increased er speed on TM. Ms Hochstein was seeing a Dr. for a fib 06/21/16 and will return when cleared Ms Murren hasnt been to [place] since 06/14/16.  She has had other medical issues as noted and will return when able.   Row Name 07/26/16 1017 08/08/16 1310 08/21/16 1450       Exercise Comments Ms Cranmer has not attended since 06/14/16 Ms Armenteros has not attended [facility] since 06/14/16. Ms Zurn was admitted to [facility] 8/16 for a heart valve problem.  She has not attended [facility] since 06/14/16.        Discharge Exercise Prescription (Final Exercise Prescription Changes):     Exercise Prescription Changes - 06/11/16 1000      Exercise Review   Progression Yes     Response to Exercise   Blood Pressure (Admit) 142/70   Blood Pressure (Exercise) 124/82   Blood Pressure (Exit) 138/70   Heart Rate (Admit) 62 bpm   Heart Rate (Exercise) 107 bpm   Heart Rate (Exit) 58 bpm   Oxygen Saturation (Admit) 91 %   Oxygen Saturation (Exercise) 89 %   Oxygen Saturation (Exit) 93 %   Rating of Perceived Exertion (Exercise) 15   Perceived Dyspnea (Exercise) 4     Progression   Progression Continue to progress workloads to maintain intensity without signs/symptoms of  physical distress.     Resistance Training   Training Prescription Yes   Weight 3   Reps 10-15     Treadmill   MPH 1.6   Grade 0   Minutes 15     NuStep   Level 3   Watts 30   Minutes 15     Biostep-RELP   Level 2   Watts 30   Minutes 15       Nutrition:  Target Goals: Understanding of nutrition guidelines, daily intake of sodium '1500mg'$ , cholesterol '200mg'$ , calories 30% from fat and 7% or less from saturated fats, daily to have 5 or more servings of fruits and vegetables.  Biometrics:     Pre Biometrics - 04/30/16 1436      Pre Biometrics   Height 5' 9.25" (1.759 m)   Weight 227 lb 4.8 oz (103.1 kg)   Waist Circumference 48.5 inches   Hip Circumference 50.5 inches   Waist to Hip Ratio 0.96 %   BMI (Calculated) 33.4       Nutrition Therapy Plan and Nutrition Goals:   Nutrition Discharge: Rate Your Plate Scores:   Psychosocial: Target Goals: Acknowledge presence or absence of depression, maximize coping skills, provide positive support system. Participant is able to verbalize types and ability to use techniques and skills needed for reducing stress and depression.  Initial Review & Psychosocial Screening:     Initial Psych Review & Screening - 04/30/16 1213      Initial Review   Current issues with Current Psychotropic Meds  Makynlee states she worries over everything. This can increase her stress levels.  Family Dynamics   Good Support System? Yes   Concerns Recent loss of significant other   Comments Husband of 68 years died 2015/09/02. "Best husband in the world"     Barriers   Psychosocial barriers to participate in program There are no identifiable barriers or psychosocial needs.;The patient should benefit from training in stress management and relaxation.     Screening Interventions   Interventions Encouraged to exercise      Quality of Life Scores:     Quality of Life - 04/30/16 1547      Quality of Life Scores   Health/Function  Pre 12.86 %   Socioeconomic Pre 28 %   Psych/Spiritual Pre 18 %   Family Pre 27 %   GLOBAL Pre 18.77 %      PHQ-9: Recent Review Flowsheet Data    Depression screen [facility] 2/9 04/30/2016 12/30/2014 10/01/2013   Decreased Interest 1 0 0   Down, Depressed, Hopeless 1 0 0   PHQ - 2 Score 2 0 0   Altered sleeping 0 - -   Tired, decreased energy 1 - -   Change in appetite 0 - -   Feeling bad or failure about yourself  0 - -   Trouble concentrating 0 - -   Moving slowly or fidgety/restless 0 - -   Suicidal thoughts 0 - -   PHQ-9 Score 3 - -   Difficult doing work/chores Not difficult at all - -      Psychosocial Evaluation and Intervention:     Psychosocial Evaluation - 05/08/16 1243      Psychosocial Evaluation & Interventions   Interventions Encouraged to exercise with the program and follow exercise prescription   Comments Counselor met with Ms. Castillo today for initial psychosocial evaluation.  She is a 70 year old who has had a rough year with increased health issues including a recent hospitalization for 10 days.  She has  strong support system with a sister and her adult son and family close by.  Ms. Elbert is also diabetic and has sleep apnea which is being treated with a CPAP; which she reports is not currently helpful due to problems with the new mask.  Ms. Deupree denies a history of depression but admits she has struggled with anxiety and is on medications currently for this at bedtime only.  She has multiple stressors in her life currently including her mother recently falling and is in a nursing home; her own personal health issues and she also lost her spouse of 4 years last August - and is grieving that as well.  Counselor discussed options to help support her through this time of grief and made some recommendations to her.  Ms. Quant stated she is not allowed to drive currently, which is also a stressor.  She has goals to breathe better and get stronger in general and get off the  Oxygen.  Counselor will continue to follow with Ms. Wragg during the course of pulmonary rehab.     Continued Psychosocial Services Needed Yes  Ms. Ion will benefit from a grief/loss counselor or support group.  She also needs to address her CPAP mask not working currently.        Psychosocial Re-Evaluation:     Psychosocial Re-Evaluation    Row Name 07/29/16 [phone]             Psychosocial Re-Evaluation   Comments Ms Mexicano has been absent from [place] since 06/14/2016 due to medical  reasons. I have talked with her several times and she is very anxious to return to [place], for it has been a very postive experience, both mentally and physically.         Education: Education Goals: Education classes will be provided on a weekly basis, covering required topics. Participant will state understanding/return demonstration of topics presented.  Learning Barriers/Preferences:     Learning Barriers/Preferences - 04/30/16 1212      Learning Barriers/Preferences   Learning Barriers None   Learning Preferences Individual Instruction      Education Topics: Initial Evaluation Education: - Verbal, written and demonstration of respiratory meds, RPE/PD scales, oximetry and breathing techniques. Instruction on use of nebulizers and MDIs: cleaning and proper use, rinsing mouth with steroid doses and importance of monitoring MDI activations.   General Nutrition Guidelines/Fats and Fiber: -Group instruction provided by verbal, written material, models and posters to present the general guidelines for heart healthy nutrition. Gives an explanation and review of dietary fats and fiber. Flowsheet Row Pulmonary Rehab from 06/10/2016 in [facility] Cardiac and Pulmonary Rehab  Date  05/27/16  Educator  CR  Instruction Review Code  2- meets goals/outcomes      Controlling Sodium/Reading Food Labels: -Group verbal and written material supporting the discussion of sodium use in heart healthy nutrition.  Review and explanation with models, verbal and written materials for utilization of the food label. Flowsheet Row Pulmonary Rehab from 06/10/2016 in [facility] Cardiac and Pulmonary Rehab  Date  06/10/16  Educator  LB  Instruction Review Code  2- meets goals/outcomes      Exercise Physiology & Risk Factors: - Group verbal and written instruction with models to review the exercise physiology of the cardiovascular system and associated critical values. Details cardiovascular disease risk factors and the goals associated with each risk factor.   Aerobic Exercise & Resistance Training: - Gives group verbal and written discussion on the health impact of inactivity. On the components of aerobic and resistive training programs and the benefits of this training and how to safely progress through these programs.   Flexibility, Balance, General Exercise Guidelines: - Provides group verbal and written instruction on the benefits of flexibility and balance training programs. Provides general exercise guidelines with specific guidelines to those with heart or lung disease. Demonstration and skill practice provided.   Stress Management: - Provides group verbal and written instruction about the health risks of elevated stress, cause of high stress, and healthy ways to reduce stress. Flowsheet Row Pulmonary Rehab from 06/10/2016 in [facility] Cardiac and Pulmonary Rehab  Date  05/08/16  Educator  [facility]  Instruction Review Code  2- meets goals/outcomes      Depression: - Provides group verbal and written instruction on the correlation between heart/lung disease and depressed mood, treatment options, and the stigmas associated with seeking treatment. Flowsheet Row Pulmonary Rehab from 06/10/2016 in [facility] Cardiac and Pulmonary Rehab  Date  06/05/16  Educator  Kathreen Cornfield, [facility]  Instruction Review Code  2- meets goals/outcomes      Exercise & Equipment Safety: - Individual verbal instruction and demonstration of  equipment use and safety with use of the equipment. Flowsheet Row Pulmonary Rehab from 06/10/2016 in [facility] Cardiac and Pulmonary Rehab  Date  04/30/16  Educator  SB  Instruction Review Code  2- meets goals/outcomes      Infection Prevention: - Provides verbal and written material to individual with discussion of infection control including proper hand washing and proper equipment cleaning during exercise session. Flowsheet  Row Pulmonary Rehab from 06/10/2016 in [facility] Cardiac and Pulmonary Rehab  Date  04/30/16  Educator  SB  Instruction Review Code  2- meets goals/outcomes      Falls Prevention: - Provides verbal and written material to individual with discussion of falls prevention and safety. Flowsheet Row Pulmonary Rehab from 06/10/2016 in [facility] Cardiac and Pulmonary Rehab  Date  04/30/16  Educator  SB  Instruction Review Code  2- meets goals/outcomes      Diabetes: - Individual verbal and written instruction to review signs/symptoms of diabetes, desired ranges of glucose level fasting, after meals and with exercise. Advice that pre and post exercise glucose checks will be done for 3 sessions at entry of program. Flowsheet Row Pulmonary Rehab from 06/10/2016 in [facility] Cardiac and Pulmonary Rehab  Date  04/30/16  Educator  SB  Instruction Review Code  2- meets goals/outcomes      Chronic Lung Diseases: - Group verbal and written instruction to review new updates, new respiratory medications, new advancements in procedures and treatments. Provide informative websites and "800" numbers of self-education.   Lung Procedures: - Group verbal and written instruction to describe testing methods done to diagnose lung disease. Review the outcome of test results. Describe the treatment choices: Pulmonary Function Tests, ABGs and oximetry.   Energy Conservation: - Provide group verbal and written instruction for methods to conserve energy, plan and organize activities. Instruct on pacing  techniques, use of adaptive equipment and posture/positioning to relieve shortness of breath.   Triggers: - Group verbal and written instruction to review types of environmental controls: home humidity, furnaces, filters, dust mite/pet prevention, HEPA vacuums. To discuss weather changes, air quality and the benefits of nasal washing.   Exacerbations: - Group verbal and written instruction to provide: warning signs, infection symptoms, calling MD promptly, preventive modes, and value of vaccinations. Review: effective airway clearance, coughing and/or vibration techniques. Create an Sports administrator.   Oxygen: - Individual and group verbal and written instruction on oxygen therapy. Includes supplement oxygen, available portable oxygen systems, continuous and intermittent flow rates, oxygen safety, concentrators, and Medicare reimbursement for oxygen.   Respiratory Medications: - Group verbal and written instruction to review medications for lung disease. Drug class, frequency, complications, importance of spacers, rinsing mouth after steroid MDI's, and proper cleaning methods for nebulizers.   AED/CPR: - Group verbal and written instruction with the use of models to demonstrate the basic use of the AED with the basic ABC's of resuscitation. Flowsheet Row Pulmonary Rehab from 06/10/2016 in [facility] Cardiac and Pulmonary Rehab  Date  05/10/16  Educator  CE  Instruction Review Code  2- meets goals/outcomes      Breathing Retraining: - Provides individuals verbal and written instruction on purpose, frequency, and proper technique of diaphragmatic breathing and pursed-lipped breathing. Applies individual practice skills.   Anatomy and Physiology of the Lungs: - Group verbal and written instruction with the use of models to provide basic lung anatomy and physiology related to function, structure and complications of lung disease. Flowsheet Row Pulmonary Rehab from 06/10/2016 in [facility] Cardiac and  Pulmonary Rehab  Date  06/07/16  Educator  [place]  Instruction Review Code  2- meets goals/outcomes      Heart Failure: - Group verbal and written instruction on the basics of heart failure: signs/symptoms, treatments, explanation of ejection fraction, enlarged heart and cardiomyopathy.   Sleep Apnea: - Individual verbal and written instruction to review Obstructive Sleep Apnea. Review of risk factors, methods for diagnosing and types  of masks and machines for OSA.   Anxiety: - Provides group, verbal and written instruction on the correlation between heart/lung disease and anxiety, treatment options, and management of anxiety.   Relaxation: - Provides group, verbal and written instruction about the benefits of relaxation for patients with heart/lung disease. Also provides patients with examples of relaxation techniques. Flowsheet Row Pulmonary Rehab from 06/10/2016 in [facility] Cardiac and Pulmonary Rehab  Date  05/22/16  Educator  [facility]  Instruction Review Code  2- Meets goals/outcomes      Knowledge Questionnaire Score:     Knowledge Questionnaire Score - 04/30/16 1546      Knowledge Questionnaire Score   Pre Score 4/10       Core Components/Risk Factors/Patient Goals at Admission:     Personal Goals and Risk Factors at Admission - 04/30/16 1554      Core Components/Risk Factors/Patient Goals on Admission   Sedentary Yes   Intervention Provide advice, education, support and counseling about physical activity/exercise needs.;Develop an individualized exercise prescription for aerobic and resistive training based on initial evaluation findings, risk stratification, comorbidities and participant's personal goals.   Expected Outcomes Achievement of increased cardiorespiratory fitness and enhanced flexibility, muscular endurance and strength shown through measurements of functional capacity and personal statement of participant.   Improve shortness of breath with ADL's Yes  SOB  increased with shopping.  Scores low for daily activities of living   Intervention Provide education, individualized exercise plan and daily activity instruction to help decrease symptoms of SOB with activities of daily living.   Expected Outcomes Short Term: Achieves a reduction of symptoms when performing activities of daily living.   Increase knowledge of respiratory medications and ability to use respiratory devices properly  --  Oxygen for activity.  Has Proventil as needed,      Core Components/Risk Factors/Patient Goals Review:      Goals and Risk Factor Review    Row Name 05/08/16 1540 05/22/16 1533 05/27/16 1537 06/14/16 1227       Core Components/Risk Factors/Patient Goals Review   Personal Goals Review Develop more efficient breathing techniques such as purse lipped breathing and diaphragmatic breathing and practicing self-pacing with activity.;Increase knowledge of respiratory medications and ability to use respiratory devices properly. Sedentary;Improve shortness of breath with ADL's;Develop more efficient breathing techniques such as purse lipped breathing and diaphragmatic breathing and practicing self-pacing with activity. Increase knowledge of respiratory medications and ability to use respiratory devices properly. Weight Management/Obesity;Sedentary;Increase Strength and Stamina;Improve shortness of breath with ADL's;Develop more efficient breathing techniques such as purse lipped breathing and diaphragmatic breathing and practicing self-pacing with activity.;Diabetes    Review Reviewed PLB with Ms Diehl. She had good technique, but needed queing. She needs some education on oxygen, and she would like to wean off of the oxygen. Her tubing was bent at the tank connection, so we changed cannulas.  On the Treadmill today, Ms Korol's  O2Sat dropped to 89%. She admitted the speed was fast at 1.43mph. We explained that time was more important than speed, so we will change her exercise goal  to 1.88mph. Reviewed PLB and the importance of breathing through her nose - she is a mouth breather.  Increased oxygen flow to 3l/m with exercise; on 2l/m, her O2Sat's were dropping to 87-88%. She has an appointment with Dr Jon Billings for a to assess her need for oxygen during the day, especially with activity. Andersen's weight is down today.  However, she is on prednisone and trying to watch her  eating.  Her blood surgars have been great!   Right now she is not walking at home due to her afib.  She does have an appt with her cardiologist next week.  She is still having shortness of breath with ADLs at home.  Discussed some energy conservation tips to help including rest breaks.  She has been taking all of her meds and using her oxygen at home on a regular  basis.  She is improving with her pursed lip breathing and starting to use it at home as well.  She is having some difficulty with the diaphragmatic breathing but is working on it in rehab.    Expected Outcomes Continue to educate Ms Kyler on oxygen and look at possible weaning or the importance of wearing oxygen, especially with activity. Increase speed on the treadmill gradually with O2Sat's in the 90's. Ms Garside has a treadmill at home and hope to develop a home plan for discharge from [facility]. Continue monitoring PLB and shortness of breath with her exercise goals. Continue to monitor O2Sat's with exercise goals and adjust O2 flow as needed. Yanelie will continue to lose weight and increase her stamina which will help with some of her shortness of breath.  We will continue to work with her to become proficient at pursed lip breathing and diaphragmatic breathing.       Core Components/Risk Factors/Patient Goals at Discharge (Final Review):      Goals and Risk Factor Review - 06/14/16 1227      Core Components/Risk Factors/Patient Goals Review   Personal Goals Review Weight Management/Obesity;Sedentary;Increase Strength and Stamina;Improve shortness  of breath with ADL's;Develop more efficient breathing techniques such as purse lipped breathing and diaphragmatic breathing and practicing self-pacing with activity.;Diabetes   Review Sylvester's weight is down today.  However, she is on prednisone and trying to watch her eating.  Her blood surgars have been great!   Right now she is not walking at home due to her afib.  She does have an appt with her cardiologist next week.  She is still having shortness of breath with ADLs at home.  Discussed some energy conservation tips to help including rest breaks.  She has been taking all of her meds and using her oxygen at home on a regular  basis.  She is improving with her pursed lip breathing and starting to use it at home as well.  She is having some difficulty with the diaphragmatic breathing but is working on it in rehab.   Expected Outcomes Brighten will continue to lose weight and increase her stamina which will help with some of her shortness of breath.  We will continue to work with her to become proficient at pursed lip breathing and diaphragmatic breathing.      ITP Comments:     ITP Comments    Row Name 04/30/16 1607 05/22/16 1530 05/31/16 1251 06/03/16 1557 06/10/16 1130   ITP Comments Med review completed  Intial ITP Continue with ITP MS Pickron's cardiologist, Dr Clayborn Bigness, is referring her to a specialist for a possible ablation. Her last ablation was in 2004. Carime had to go to the restroom 3 times during [facility] so she didn't exercise as much as she did her last session. Ms Moosman took her medications when she arrived at [facility]. Her chronic fib was at a HR of 132. She worked out on the NS with HRs 132-147. We sent her home to rest and called her at 4pm to check on her -  HRs now back to normal 79-84. Ms Kestner will meet on the 28th with the cardiologist who will be doing her ablation. She has very irregular rhythms during exercise in [facility] and is having lower O2Sat's, 89% on the 3l/m .   [place] Name 06/24/16  1555 06/26/16 1353 07/02/16 1559 07/03/16 0846 07/11/16 1309   ITP Comments Ms Echavarria called today and has started on a new medicine for her atrial fibrillation - Flecainide - which has made her nauseated and has not changed her atrial fib. She has a call into the physician who is hesitant to do an ablation due to her lung condition. Ms Bautch was seeing a Dr about her a-fib last week and will return when cleared Ms Maresh called 07/01/2016 and is adjusting to a new medication for her atrial fibrillation. She also fell over the weekend and was taken to the hospital for treatment of a heaad injury. Called Ms Fedorchak for an update with her fall - fortunately no injury to the head. 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 She also has an appointment about now having the cardiac ablation. Home health will be with her for 2 weeks. Ms Kilburg is looking forward to returning to [place] as soon as possible. Ms Olsen hasnt been to [facility] since 06/14/16.  She has had other medical issues as noted and will return when able.   [place] Name 07/15/16 0944 07/26/16 1017 07/31/16 1447 07/31/16 1449 08/08/16 1311   ITP Comments Ms Yott called with an update - still receiving [place]; doubled her fluid pill to '80mg'$ ; and has appointment with cardiologist on 07/26/16. Ms Tat has not attended since 06/14/16 Called Ms Dena - she now has a CHF diagnosis, and her laxis has doubled to 80 mg/day. . She goes to the CHF Clinic next Tuesday and does plann to return to [place]. Called Ms Kampf - she now has a CHF diagnosis, and her laxis has doubled to 80 mg/day. . She goes to the CHF Clinic next Tuesday and does plan to return to [place]. Ms Campoy has not attended [facility] since 06/14/16.   [place] Name 08/12/16 1554 09/17/16 0631         ITP Comments Ms Magno called from [facility] where she was admitted last Wednesday, 16th. She is under the care of Dr. Tammi Klippel, a  cardiologist and was admitted for shortness of breath. She has lost 11lbs and will have a cardiac cath today.  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 She last attended 06/14/2016.         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 She last attended 06/14/2016

## 2016-09-17 NOTE — Progress Notes (Signed)
Discharge Summary  Patient Details  Name: Betty [name] MRN: [id number] Date of Birth: 1946/07/09 Referring Provider:   Arn Medal Row Documentation from 06/03/2016 in [facility]  Referring Provider  Betty. 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 She last attended 06/14/2016.  Smoking History:  History  Smoking Status   Former Smoker   Packs/day: 0.50   Years: 15.00   Types: Cigarettes  Smokeless Tobacco   Never Used    Comment: Has a 20-pack-year history, qutting in 1970.     Diagnosis:  No diagnosis found.  ADL UCSD:   Initial Exercise Prescription:     Initial Exercise Prescription - 06/03/16 1400      Date of Initial Exercise RX and Referring Provider   Date 04/30/16   Referring Provider Betty. Wynn Maudlin      Discharge Exercise Prescription (Final Exercise Prescription Changes):     Exercise Prescription Changes - 06/11/16 1000      Exercise Review   Progression Yes     Response to Exercise   Blood Pressure (Admit) 142/70   Blood Pressure (Exercise) 124/82   Blood Pressure (Exit) 138/70   Heart Rate (Admit) 62 bpm   Heart Rate (Exercise) 107 bpm   Heart Rate (Exit) 58 bpm   Oxygen Saturation (Admit) 91 %   Oxygen Saturation (Exercise) 89 %   Oxygen Saturation (Exit) 93 %   Rating of Perceived Exertion (Exercise) 15   Perceived Dyspnea (Exercise) 4     Progression   Progression Continue to progress workloads to maintain intensity without signs/symptoms of physical distress.     Resistance Training   Training Prescription Yes   Weight 3   Reps 10-15     Treadmill   MPH 1.6   Grade 0   Minutes 15     NuStep   Level 3   Watts 30   Minutes 15     Biostep-RELP   Level 2   Watts 30   Minutes 15      Functional Capacity:     6 Minute Walk    Row Name 04/30/16 1438          6 Minute Walk   Phase Initial     Distance 1000 feet     Walk Time 6 minutes     MPH 1.9     RPE 12     Perceived Dyspnea  2     Resting HR 72 bpm     Resting BP 126/60     Max Ex. HR 106 bpm     Max Ex. BP 152/70        Psychological, QOL, Others - Outcomes: PHQ 2/9: Depression screen [facility] 2/9 04/30/2016 12/30/2014 10/01/2013  Decreased Interest 1 0 0  Down, Depressed, Hopeless 1 0 0  PHQ - 2 Score 2 0 0  Altered sleeping 0 - -  Tired, decreased energy 1 - -  Change in appetite 0 - -  Feeling bad or failure about yourself  0 - -  Trouble concentrating 0 - -  Moving slowly or fidgety/restless 0 - -  Suicidal thoughts 0 - -  PHQ-9 Score 3 - -  Difficult doing work/chores Not difficult at all - -  Some recent data might be hidden    Quality of Life:  Quality of Life - 04/30/16 1547      Quality of Life Scores   Health/Function Pre 12.86 %   Socioeconomic Pre 28 %   Psych/Spiritual Pre 18 %   Family Pre 27 %   GLOBAL Pre 18.77 %      Personal Goals: Goals established at orientation with interventions provided to work toward goal.     Personal Goals and Risk Factors at Admission - 04/30/16 1554      Core Components/Risk Factors/Patient Goals on Admission   Sedentary Yes   Intervention Provide advice, education, support and counseling about physical activity/exercise needs.;Develop an individualized exercise prescription for aerobic and resistive training based on initial evaluation findings, risk stratification, comorbidities and participant's personal goals.   Expected Outcomes Achievement of increased cardiorespiratory fitness and enhanced flexibility, muscular endurance and strength shown through measurements of functional capacity and personal statement of participant.   Improve shortness of breath with ADL's Yes  SOB increased with shopping.  Scores low for daily activities of living   Intervention Provide education, individualized exercise plan and  daily activity instruction to help decrease symptoms of SOB with activities of daily living.   Expected Outcomes Short Term: Achieves a reduction of symptoms when performing activities of daily living.   Increase knowledge of respiratory medications and ability to use respiratory devices properly  --  Oxygen for activity.  Has Proventil as needed,       Personal Goals Discharge:     Goals and Risk Factor Review    Row Name 05/08/16 1540 05/22/16 1533 05/27/16 1537 06/14/16 1227       Core Components/Risk Factors/Patient Goals Review   Personal Goals Review Develop more efficient breathing techniques such as purse lipped breathing and diaphragmatic breathing and practicing self-pacing with activity.;Increase knowledge of respiratory medications and ability to use respiratory devices properly. Sedentary;Improve shortness of breath with ADL's;Develop more efficient breathing techniques such as purse lipped breathing and diaphragmatic breathing and practicing self-pacing with activity. Increase knowledge of respiratory medications and ability to use respiratory devices properly. Weight Management/Obesity;Sedentary;Increase Strength and Stamina;Improve shortness of breath with ADL's;Develop more efficient breathing techniques such as purse lipped breathing and diaphragmatic breathing and practicing self-pacing with activity.;Diabetes    Review Reviewed PLB with Betty [name]. She had good technique, but needed queing. She needs some education on oxygen, and she would like to wean off of the oxygen. Her tubing was bent at the tank connection, so we changed cannulas.  On the Treadmill today, Betty [name]'s  O2Sat dropped to 89%. She admitted the speed was fast at 1.25mph. We explained that time was more important than speed, so we will change her exercise goal to 1.90mph. Reviewed PLB and the importance of breathing through her nose - she is a mouth breather.  Increased oxygen flow to 3l/m with exercise; on 2l/m, her  O2Sat's were dropping to 87-88%. She has an appointment with Betty [name] for a 66mwd to assess her need for oxygen during the day, especially with activity. Robbi's weight is down today.  However, she is on prednisone and trying to watch her eating.  Her blood surgars have been great!   Right now she is not walking at home due to her afib.  She does have an appt with her cardiologist next week.  She is still having shortness of breath with ADLs at home.  Discussed some energy conservation tips to help including rest breaks.  She has been taking all of her meds and using her  oxygen at home on a regular  basis.  She is improving with her pursed lip breathing and starting to use it at home as well.  She is having some difficulty with the diaphragmatic breathing but is working on it in rehab.    Expected Outcomes Continue to educate Betty Schrupp on oxygen and look at possible weaning or the importance of wearing oxygen, especially with activity. Increase speed on the treadmill gradually with O2Sat's in the 90's. Betty Graziosi has a treadmill at home and hope to develop a home plan for discharge from [facility]. Continue monitoring PLB and shortness of breath with her exercise goals. Continue to monitor O2Sat's with exercise goals and adjust O2 flow as needed. Mystic will continue to lose weight and increase her stamina which will help with some of her shortness of breath.  We will continue to work with her to become proficient at pursed lip breathing and diaphragmatic breathing.       Nutrition & Weight - Outcomes:     Pre Biometrics - 04/30/16 1436      Pre Biometrics   Height 5' 9.25" (1.759 m)   Weight 227 lb 4.8 oz (103.1 kg)   Waist Circumference 48.5 inches   Hip Circumference 50.5 inches   Waist to Hip Ratio 0.96 %   BMI (Calculated) 33.4       Nutrition:   Nutrition Discharge:   Education Questionnaire Score:     Knowledge Questionnaire Score - 04/30/16 1546      Knowledge Questionnaire Score    Pre Score 4/10      Goals reviewed with patient; copy given to patient.

## 2016-09-18 DIAGNOSIS — Z7901 Long term (current) use of anticoagulants: Secondary | ICD-10-CM | POA: Insufficient documentation

## 2016-10-20 ENCOUNTER — Other Ambulatory Visit
Admission: RE | Admit: 2016-10-20 | Discharge: 2016-10-20 | Disposition: A | Discharge status: Home / Self Care | Payer: Medicare Other | Source: Ambulatory Visit | Attending: Family Medicine | Admitting: Family Medicine

## 2016-10-20 DIAGNOSIS — N390 Urinary tract infection, site not specified: Secondary | ICD-10-CM | POA: Diagnosis present

## 2016-10-20 LAB — URINALYSIS COMPLETE WITH MICROSCOPIC (ARMC ONLY)
Bilirubin Urine: NEGATIVE
GLUCOSE, UA: NEGATIVE mg/dL
Hgb urine dipstick: NEGATIVE
KETONES UR: NEGATIVE mg/dL
Nitrite: NEGATIVE
PROTEIN: NEGATIVE mg/dL
Specific Gravity, Urine: 1.005 (ref 1.005–1.030)
pH: 5 (ref 5.0–8.0)

## 2016-12-05 ENCOUNTER — Other Ambulatory Visit (INDEPENDENT_AMBULATORY_CARE_PROVIDER_SITE_OTHER): Payer: Self-pay | Admitting: Vascular Surgery

## 2016-12-05 DIAGNOSIS — I6523 Occlusion and stenosis of bilateral carotid arteries: Secondary | ICD-10-CM

## 2016-12-09 ENCOUNTER — Encounter (INDEPENDENT_AMBULATORY_CARE_PROVIDER_SITE_OTHER): Payer: Medicare Other

## 2016-12-09 ENCOUNTER — Ambulatory Visit (INDEPENDENT_AMBULATORY_CARE_PROVIDER_SITE_OTHER): Payer: Self-pay | Admitting: Vascular Surgery

## 2016-12-11 NOTE — Telephone Encounter (Signed)
Called Betty [name] - she has doubled her lasix and now has a CHF diagnosis. She goes to the CHF Clinic next Tuesday and does paln to return to [place].

## 2016-12-11 NOTE — Telephone Encounter (Signed)
Betty [name] called from [facility] where she was admitted last Wednesday, 16th. She is under the care of Dr. Tammi Klippel, a cardiologist and was admitted for shortness of breath. She has lost 11lbs and will have a cardiac cath today.

## 2017-01-23 DIAGNOSIS — I484 Atypical atrial flutter: Secondary | ICD-10-CM | POA: Insufficient documentation

## 2017-02-06 ENCOUNTER — Encounter (INDEPENDENT_AMBULATORY_CARE_PROVIDER_SITE_OTHER): Payer: Medicare Other

## 2017-02-06 ENCOUNTER — Ambulatory Visit (INDEPENDENT_AMBULATORY_CARE_PROVIDER_SITE_OTHER): Payer: Self-pay | Admitting: Vascular Surgery

## 2017-03-24 ENCOUNTER — Ambulatory Visit: Payer: Medicare Other

## 2017-03-27 ENCOUNTER — Inpatient Hospital Stay
Admission: EM | Admit: 2017-03-27 | Discharge: 2017-04-09 | DRG: 920 | Disposition: A | Discharge status: Home Health | Payer: Medicare Other | Attending: Internal Medicine | Admitting: Internal Medicine

## 2017-03-27 DIAGNOSIS — I776 Arteritis, unspecified: Secondary | ICD-10-CM | POA: Diagnosis present

## 2017-03-27 DIAGNOSIS — K91840 Postprocedural hemorrhage and hematoma of a digestive system organ or structure following a digestive system procedure: Principal | ICD-10-CM | POA: Diagnosis present

## 2017-03-27 DIAGNOSIS — D62 Acute posthemorrhagic anemia: Secondary | ICD-10-CM | POA: Diagnosis present

## 2017-03-27 DIAGNOSIS — Z952 Presence of prosthetic heart valve: Secondary | ICD-10-CM | POA: Diagnosis not present

## 2017-03-27 DIAGNOSIS — Z1612 Extended spectrum beta lactamase (ESBL) resistance: Secondary | ICD-10-CM | POA: Diagnosis present

## 2017-03-27 DIAGNOSIS — G4730 Sleep apnea, unspecified: Secondary | ICD-10-CM | POA: Diagnosis present

## 2017-03-27 DIAGNOSIS — K08409 Partial loss of teeth, unspecified cause, unspecified class: Secondary | ICD-10-CM

## 2017-03-27 DIAGNOSIS — E785 Hyperlipidemia, unspecified: Secondary | ICD-10-CM | POA: Diagnosis present

## 2017-03-27 DIAGNOSIS — Z79899 Other long term (current) drug therapy: Secondary | ICD-10-CM

## 2017-03-27 DIAGNOSIS — I5032 Chronic diastolic (congestive) heart failure: Secondary | ICD-10-CM | POA: Diagnosis present

## 2017-03-27 DIAGNOSIS — G2581 Restless legs syndrome: Secondary | ICD-10-CM | POA: Diagnosis present

## 2017-03-27 DIAGNOSIS — E669 Obesity, unspecified: Secondary | ICD-10-CM | POA: Diagnosis present

## 2017-03-27 DIAGNOSIS — Z90710 Acquired absence of both cervix and uterus: Secondary | ICD-10-CM | POA: Diagnosis not present

## 2017-03-27 DIAGNOSIS — I110 Hypertensive heart disease with heart failure: Secondary | ICD-10-CM | POA: Diagnosis present

## 2017-03-27 DIAGNOSIS — Z823 Family history of stroke: Secondary | ICD-10-CM

## 2017-03-27 DIAGNOSIS — N390 Urinary tract infection, site not specified: Secondary | ICD-10-CM | POA: Diagnosis not present

## 2017-03-27 DIAGNOSIS — Z8261 Family history of arthritis: Secondary | ICD-10-CM

## 2017-03-27 DIAGNOSIS — I959 Hypotension, unspecified: Secondary | ICD-10-CM | POA: Diagnosis present

## 2017-03-27 DIAGNOSIS — K219 Gastro-esophageal reflux disease without esophagitis: Secondary | ICD-10-CM | POA: Diagnosis present

## 2017-03-27 DIAGNOSIS — R791 Abnormal coagulation profile: Secondary | ICD-10-CM

## 2017-03-27 DIAGNOSIS — Z87891 Personal history of nicotine dependence: Secondary | ICD-10-CM

## 2017-03-27 DIAGNOSIS — Z9049 Acquired absence of other specified parts of digestive tract: Secondary | ICD-10-CM | POA: Diagnosis not present

## 2017-03-27 DIAGNOSIS — W010XXA Fall on same level from slipping, tripping and stumbling without subsequent striking against object, initial encounter: Secondary | ICD-10-CM | POA: Diagnosis present

## 2017-03-27 DIAGNOSIS — Z8249 Family history of ischemic heart disease and other diseases of the circulatory system: Secondary | ICD-10-CM

## 2017-03-27 DIAGNOSIS — Y838 Other surgical procedures as the cause of abnormal reaction of the patient, or of later complication, without mention of misadventure at the time of the procedure: Secondary | ICD-10-CM | POA: Diagnosis present

## 2017-03-27 DIAGNOSIS — M1990 Unspecified osteoarthritis, unspecified site: Secondary | ICD-10-CM | POA: Diagnosis present

## 2017-03-27 DIAGNOSIS — Z800 Family history of malignant neoplasm of digestive organs: Secondary | ICD-10-CM

## 2017-03-27 DIAGNOSIS — Z98818 Other dental procedure status: Secondary | ICD-10-CM

## 2017-03-27 DIAGNOSIS — Z7901 Long term (current) use of anticoagulants: Secondary | ICD-10-CM

## 2017-03-27 DIAGNOSIS — Z7984 Long term (current) use of oral hypoglycemic drugs: Secondary | ICD-10-CM | POA: Diagnosis not present

## 2017-03-27 DIAGNOSIS — S43402A Unspecified sprain of left shoulder joint, initial encounter: Secondary | ICD-10-CM | POA: Diagnosis present

## 2017-03-27 DIAGNOSIS — I480 Paroxysmal atrial fibrillation: Secondary | ICD-10-CM | POA: Diagnosis present

## 2017-03-27 DIAGNOSIS — R32 Unspecified urinary incontinence: Secondary | ICD-10-CM | POA: Diagnosis present

## 2017-03-27 DIAGNOSIS — Z6834 Body mass index (BMI) 34.0-34.9, adult: Secondary | ICD-10-CM

## 2017-03-27 DIAGNOSIS — W19XXXA Unspecified fall, initial encounter: Secondary | ICD-10-CM

## 2017-03-27 DIAGNOSIS — E1169 Type 2 diabetes mellitus with other specified complication: Secondary | ICD-10-CM | POA: Diagnosis present

## 2017-03-27 DIAGNOSIS — I4891 Unspecified atrial fibrillation: Secondary | ICD-10-CM | POA: Diagnosis present

## 2017-03-27 DIAGNOSIS — R0602 Shortness of breath: Secondary | ICD-10-CM

## 2017-03-27 HISTORY — DX: Postprocedural hemorrhage of a digestive system organ or structure following a digestive system procedure: K91.840

## 2017-03-27 HISTORY — DX: Other dental procedure status: Z98.818

## 2017-03-27 LAB — BASIC METABOLIC PANEL
Anion gap: 18 — ABNORMAL HIGH (ref 5–15)
BUN: 91 mg/dL — ABNORMAL HIGH (ref 6–20)
CALCIUM: 8.3 mg/dL — AB (ref 8.9–10.3)
CO2: 23 mmol/L (ref 22–32)
CREATININE: 1.56 mg/dL — AB (ref 0.44–1.00)
Chloride: 90 mmol/L — ABNORMAL LOW (ref 101–111)
GFR calc non Af Amer: 33 mL/min — ABNORMAL LOW (ref >60)
GFR, EST AFRICAN AMERICAN: 38 mL/min — AB (ref >60)
GLUCOSE: 217 mg/dL — AB (ref 65–99)
Potassium: 5.3 mmol/L — ABNORMAL HIGH (ref 3.5–5.1)
Sodium: 131 mmol/L — ABNORMAL LOW (ref 135–145)

## 2017-03-27 LAB — CBC WITH DIFFERENTIAL/PLATELET
BASOS ABS: 0.1 10*3/uL (ref 0–0.1)
BASOS PCT: 0 %
EOS ABS: 0 10*3/uL (ref 0–0.7)
EOS PCT: 0 %
HCT: 19.8 % — ABNORMAL LOW (ref 35.0–47.0)
Hemoglobin: 5.9 g/dL — ABNORMAL LOW (ref 12.0–16.0)
Lymphocytes Relative: 2 %
Lymphs Abs: 0.5 10*3/uL — ABNORMAL LOW (ref 1.0–3.6)
MCH: 24.2 pg — ABNORMAL LOW (ref 26.0–34.0)
MCHC: 30 g/dL — ABNORMAL LOW (ref 32.0–36.0)
MCV: 80.6 fL (ref 80.0–100.0)
Monocytes Absolute: 1.7 10*3/uL — ABNORMAL HIGH (ref 0.2–0.9)
Monocytes Relative: 8 %
Neutro Abs: 17.9 10*3/uL — ABNORMAL HIGH (ref 1.4–6.5)
Neutrophils Relative %: 90 %
Platelets: 367 10*3/uL (ref 150–440)
RBC: 2.45 MIL/uL — AB (ref 3.80–5.20)
RDW: 22.5 % — AB (ref 11.5–14.5)
WBC: 20.2 10*3/uL — ABNORMAL HIGH (ref 3.6–11.0)

## 2017-03-27 LAB — GLUCOSE, CAPILLARY: GLUCOSE-CAPILLARY: 176 mg/dL — AB (ref 65–99)

## 2017-03-27 LAB — PROTIME-INR
INR: 3.65
INR: 4.9
INR: 1.31
INR: 1.21
PROTHROMBIN TIME: 47.1 s — AB (ref 11.4–15.2)
Prothrombin Time: 37.2 seconds — ABNORMAL HIGH (ref 11.4–15.2)
Prothrombin Time: 16.4 seconds — ABNORMAL HIGH (ref 11.4–15.2)
Prothrombin Time: 15.4 seconds — ABNORMAL HIGH (ref 11.4–15.2)

## 2017-03-27 LAB — ABO/RH: ABO/RH(D): A POS

## 2017-03-27 LAB — PREPARE RBC (CROSSMATCH)

## 2017-03-27 MED ORDER — INSULIN ASPART 100 UNIT/ML ~~LOC~~ SOLN
0.0000 [IU] | Freq: Three times a day (TID) | SUBCUTANEOUS | Status: DC
  Administered 2017-03-28: 3 [IU] via SUBCUTANEOUS
  Administered 2017-03-28: 2 [IU] via SUBCUTANEOUS
  Administered 2017-03-29: 5 [IU] via SUBCUTANEOUS
  Administered 2017-03-29: 1 [IU] via SUBCUTANEOUS
  Administered 2017-03-29: 3 [IU] via SUBCUTANEOUS
  Administered 2017-03-30: 1 [IU] via SUBCUTANEOUS
  Administered 2017-03-30: 2 [IU] via SUBCUTANEOUS
  Administered 2017-03-30 – 2017-04-01 (×3): 1 [IU] via SUBCUTANEOUS
  Administered 2017-04-01 – 2017-04-02 (×4): 2 [IU] via SUBCUTANEOUS
  Administered 2017-04-03 – 2017-04-06 (×8): 1 [IU] via SUBCUTANEOUS
  Administered 2017-04-07: 2 [IU] via SUBCUTANEOUS
  Administered 2017-04-08 (×2): 1 [IU] via SUBCUTANEOUS
  Filled 2017-03-27: qty 1
  Filled 2017-03-27 (×2): qty 2
  Filled 2017-03-27: qty 1
  Filled 2017-03-27 (×2): qty 2
  Filled 2017-03-27 (×2): qty 1
  Filled 2017-03-27 (×2): qty 2
  Filled 2017-03-27 (×5): qty 1
  Filled 2017-03-27: qty 5
  Filled 2017-03-27 (×2): qty 1
  Filled 2017-03-27: qty 2
  Filled 2017-03-27: qty 3
  Filled 2017-03-27 (×4): qty 1
  Filled 2017-03-27: qty 3

## 2017-03-27 MED ORDER — ACETAMINOPHEN 325 MG PO TABS
650.0000 mg | ORAL_TABLET | Freq: Four times a day (QID) | ORAL | Status: DC | PRN
  Administered 2017-03-28 – 2017-04-08 (×7): 650 mg via ORAL
  Filled 2017-03-27 (×8): qty 2

## 2017-03-27 MED ORDER — ROPINIROLE HCL 1 MG PO TABS
4.0000 mg | ORAL_TABLET | Freq: Every day | ORAL | Status: DC
  Administered 2017-03-28 – 2017-04-08 (×13): 4 mg via ORAL
  Filled 2017-03-27 (×13): qty 4

## 2017-03-27 MED ORDER — AZATHIOPRINE 50 MG PO TABS
100.0000 mg | ORAL_TABLET | Freq: Every day | ORAL | Status: DC
  Administered 2017-03-28 – 2017-04-09 (×13): 100 mg via ORAL
  Filled 2017-03-27 (×14): qty 2

## 2017-03-27 MED ORDER — VITAMIN K1 10 MG/ML IJ SOLN: 10.0000 mg | Freq: Once | INTRAVENOUS | Status: DC

## 2017-03-27 MED ORDER — GABAPENTIN 300 MG PO CAPS
600.0000 mg | ORAL_CAPSULE | Freq: Two times a day (BID) | ORAL | Status: DC
  Administered 2017-03-28 – 2017-04-09 (×26): 600 mg via ORAL
  Filled 2017-03-27 (×26): qty 2

## 2017-03-27 MED ORDER — HYDROCODONE-ACETAMINOPHEN 5-325 MG PO TABS
1.0000 | ORAL_TABLET | Freq: Three times a day (TID) | ORAL | Status: DC | PRN
  Administered 2017-03-27 – 2017-03-28 (×2): 1 via ORAL
  Filled 2017-03-27 (×2): qty 1

## 2017-03-27 MED ORDER — PROCHLORPERAZINE EDISYLATE 5 MG/ML IJ SOLN
5.0000 mg | INTRAMUSCULAR | Status: DC | PRN
  Filled 2017-03-27: qty 1

## 2017-03-27 MED ORDER — ATORVASTATIN CALCIUM 20 MG PO TABS
20.0000 mg | ORAL_TABLET | Freq: Every day | ORAL | Status: DC
  Administered 2017-03-28 – 2017-04-09 (×13): 20 mg via ORAL
  Filled 2017-03-27 (×13): qty 1

## 2017-03-27 MED ORDER — HEPARIN (PORCINE) IN NACL 100-0.45 UNIT/ML-% IJ SOLN
2000.0000 [IU]/h | INTRAMUSCULAR | Status: DC
  Administered 2017-03-28 – 2017-03-29 (×3): 1300 [IU]/h via INTRAVENOUS
  Administered 2017-03-30 – 2017-03-31 (×2): 1500 [IU]/h via INTRAVENOUS
  Administered 2017-04-02 – 2017-04-04 (×3): 1700 [IU]/h via INTRAVENOUS
  Administered 2017-04-04 – 2017-04-08 (×7): 2000 [IU]/h via INTRAVENOUS
  Filled 2017-03-27 (×26): qty 250

## 2017-03-27 MED ORDER — OXYBUTYNIN CHLORIDE 5 MG PO TABS
5.0000 mg | ORAL_TABLET | Freq: Two times a day (BID) | ORAL | Status: DC
  Administered 2017-03-28 – 2017-04-09 (×25): 5 mg via ORAL
  Filled 2017-03-27 (×26): qty 1

## 2017-03-27 MED ORDER — PROTHROMBIN COMPLEX CONC HUMAN 500 UNITS IV KIT
2500.0000 [IU] | PACK | Status: AC
  Administered 2017-03-27: 2500 [IU] via INTRAVENOUS
  Filled 2017-03-27: qty 100

## 2017-03-27 MED ORDER — WARFARIN SODIUM 5 MG PO TABS: 6.0000 mg | ORAL_TABLET | Freq: Every day | ORAL | Status: DC

## 2017-03-27 MED ORDER — SODIUM CHLORIDE 0.9 % IV SOLN
INTRAVENOUS | Status: DC
Start: 2017-03-27 — End: 2017-03-28
  Administered 2017-03-27: 23:00:00 via INTRAVENOUS

## 2017-03-27 MED ORDER — INSULIN ASPART 100 UNIT/ML ~~LOC~~ SOLN: 0.0000 [IU] | Freq: Every day | SUBCUTANEOUS | Status: DC

## 2017-03-27 MED ORDER — ONDANSETRON HCL 4 MG/2ML IJ SOLN
INTRAMUSCULAR | Status: AC
  Administered 2017-03-27: 4 mg via INTRAVENOUS
  Filled 2017-03-27: qty 2

## 2017-03-27 MED ORDER — SODIUM CHLORIDE 0.9% FLUSH
3.0000 mL | Freq: Two times a day (BID) | INTRAVENOUS | Status: DC
  Administered 2017-03-27 – 2017-04-08 (×20): 3 mL via INTRAVENOUS

## 2017-03-27 MED ORDER — PANTOPRAZOLE SODIUM 40 MG PO TBEC
40.0000 mg | DELAYED_RELEASE_TABLET | Freq: Every day | ORAL | Status: DC
  Administered 2017-03-28 – 2017-04-09 (×13): 40 mg via ORAL
  Filled 2017-03-27 (×13): qty 1

## 2017-03-27 MED ORDER — SODIUM CHLORIDE 0.9 % IV SOLN: 10.0000 mL/h | Freq: Once | INTRAVENOUS | Status: DC

## 2017-03-27 MED ORDER — TRANEXAMIC ACID 1000 MG/10ML IV SOLN
500.0000 mg | Freq: Once | INTRAVENOUS | Status: AC
  Administered 2017-03-27: 500 mg via TOPICAL
  Filled 2017-03-27: qty 10

## 2017-03-27 MED ORDER — VITAMIN K1 10 MG/ML IJ SOLN
10.0000 mg | INTRAVENOUS | Status: AC
  Administered 2017-03-27: 10 mg via INTRAVENOUS
  Filled 2017-03-27: qty 1

## 2017-03-27 MED ORDER — HEPARIN BOLUS VIA INFUSION
5000.0000 [IU] | Freq: Once | INTRAVENOUS | Status: AC
  Administered 2017-03-28: 5000 [IU] via INTRAVENOUS
  Filled 2017-03-27: qty 5000

## 2017-03-27 MED ORDER — VENLAFAXINE HCL ER 75 MG PO CP24
225.0000 mg | ORAL_CAPSULE | Freq: Every day | ORAL | Status: DC
  Administered 2017-03-28 – 2017-04-09 (×13): 225 mg via ORAL
  Filled 2017-03-27 (×13): qty 3

## 2017-03-27 MED ORDER — ACETAMINOPHEN 650 MG RE SUPP: 650.0000 mg | Freq: Four times a day (QID) | RECTAL | Status: DC | PRN

## 2017-03-27 NOTE — ED Notes (Signed)
Infusion of [place] is complete.

## 2017-03-27 NOTE — ED Provider Notes (Signed)
[facility] Emergency Department Provider Note   ____________________________________________   I have reviewed the triage vital signs and the nursing notes.   HISTORY  Chief Complaint Hypotension and Dental Injury   History limited by: Not Limited   HPI Betty [name] is a 71 y.o. female who presents to the emergency department today because of concerns for bleeding. Patient underwent oral surgery yesterday. Started having bleeding roughly 5 hours prior to presentation. It has been constant since then. She has had significant bleeding from her oropharynx. EMS states large amount of blood on scene. She states she had 4 teeth removed. She is on warfarin and states that she did not stop it prior to the surgery.    Past Medical History:  Diagnosis Date  . Acute diastolic heart failure ([place])   . Allergy   . 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 Asthma   . Atrial fibrillation ([place])   . Backache, unspecified   . Cardiomegaly   . COPD (chronic obstructive pulmonary disease) ([place])   . Diabetes mellitus without complication ([employer])   . 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 Esophageal reflux   . Headache(784.0)   . Herpes zoster without mention of complication   . Hx: UTI (urinary tract infection)   . Hypertension    heart controlled w CHF  . Nontoxic uninodular goiter   . Obesity, unspecified   . Osteoarthrosis, unspecified whether generalized or localized, unspecified site   . Unspecified sleep apnea   . Urine incontinence    hx of    Patient Active Problem List   Diagnosis Date Noted  . Syncope 06/25/2016  . UTI (lower urinary tract infection) 06/25/2016  . A-fib ([place]) 05/10/2016  . Dyspnea 05/10/2016  . Acute diastolic CHF (congestive heart failure) ([place]) 05/10/2016  . Anemia 05/10/2016  . Anemia 05/10/2016  . Other specified abnormal immunological findings in serum 04/24/2016  . Sepsis ([place]) 04/01/2016  . Prolonged  Q-T interval on ECG 03/13/2016  . Mitral stenosis 03/13/2016  . Essential hypertension, benign 03/13/2016  . Diastolic congestive heart failure ([place]) 03/13/2016  . Interstitial lung disease ([place]) 12/29/2015  . Chronic LBP 07/26/2015  . Essential (primary) hypertension 07/26/2015  . Idiopathic insomnia 07/26/2015  . Restless leg 07/26/2015  . Obesity (BMI 30-39.9) 01/07/2015  . Abnormal EKG 09/11/2014  . External nasal lesion 02/26/2014  . Abnormal liver function tests 01/28/2014  . Ankle pain, right 01/28/2014  . Arthralgia of ankle or foot 01/28/2014  . Urinary frequency 12/24/2013  . Hemorrhoids 12/24/2013  . Neck pain on right side 11/10/2013  . Vitamin D deficiency 09/09/2013  . Obstructive sleep apnea 05/30/2013  . Urinary incontinence 05/30/2013  . Diabetes ([place]) 05/30/2013  . History of colonic polyps 05/30/2013  . Controlled type 2 diabetes mellitus without complication ([place]) 35/59/7416  . H/O deep venous thrombosis 05/05/2013  . Depression 04/13/2009  . THYROID NODULE 04/13/2009  . 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 GERD 04/13/2009  . OSTEOARTHRITIS 04/13/2009  . BACK PAIN 04/13/2009  . HEADACHE 04/13/2009  . Gastro-esophageal reflux disease without esophagitis 04/13/2009  . Mild episode of recurrent major depressive disorder ([place]) 04/13/2009  . 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 ATRIAL FIBRILLATION 03/28/2009  . DIASTOLIC HEART FAILURE, ACUTE 03/28/2009  . VENTRICULAR HYPERTROPHY, LEFT 03/28/2009    Past Surgical History:  Procedure Laterality Date  . ABDOMINAL HYSTERECTOMY  1979   complete (for precancerous cells)  .  ABLATION  2011 & 2014  . CHOLECYSTECTOMY    . OOPHORECTOMY      Prior to Admission medications   Medication Sig Start Date End Date Taking? Authorizing Provider  albuterol (PROVENTIL HFA;VENTOLIN HFA) 108 (90 Base) MCG/ACT inhaler Inhale 2 puffs into the lungs every 4 (four) hours as needed  for wheezing or shortness of breath. 01/17/16   Flora Lipps, MD  albuterol (PROVENTIL) (2.5 MG/3ML) 0.083% nebulizer solution Take 3 mLs (2.5 mg total) by nebulization every 4 (four) hours as needed for wheezing or shortness of breath. 06/28/16   Gladstone Lighter, MD  azaTHIOprine (IMURAN) 50 MG tablet Take 100 mg by mouth daily.     Historical Provider, MD  azithromycin (ZITHROMAX Z-PAK) 250 MG tablet Take 2 tabs on day 1 And then 1 tab every day until finished. 06/27/16   Gladstone Lighter, MD  Biotin 5000 MCG CAPS Take 5,000 mcg by mouth daily.     Historical Provider, MD  calcium-vitamin D (OSCAL WITH D) 500-200 MG-UNIT per tablet Take 1 tablet by mouth daily.    Historical Provider, MD  chlorpheniramine-HYDROcodone (TUSSIONEX) 10-8 MG/5ML SUER Take 5 mLs by mouth every 12 (twelve) hours. 06/27/16   Gladstone Lighter, MD  diltiazem (CARDIZEM CD) 360 MG 24 hr capsule Take 360 mg by mouth daily. 06/17/16   Historical Provider, MD  furosemide (LASIX) 40 MG tablet Take 40 mg by mouth daily.    Historical Provider, MD  gabapentin (NEURONTIN) 600 MG tablet Take 600 mg by mouth 2 (two) times daily.    Historical Provider, MD  HYDROcodone-acetaminophen (NORCO/VICODIN) 5-325 MG tablet Take 1 tablet by mouth every 8 (eight) hours as needed for moderate pain.    Historical Provider, MD  metFORMIN (GLUCOPHAGE) 1000 MG tablet Take 1,000 mg by mouth 2 (two) times daily with a meal.     Historical Provider, MD  oxybutynin (DITROPAN) 5 MG tablet Take 5 mg by mouth 2 (two) times daily.    Historical Provider, MD  pantoprazole (PROTONIX) 40 MG tablet Take 40 mg by mouth daily.    Historical Provider, MD  potassium chloride (K-DUR) 10 MEQ tablet Take 2 tablets (20 mEq total) by mouth daily. While on lasix 06/27/16   Gladstone Lighter, MD  predniSONE (STERAPRED UNI-PAK 21 TAB) 10 MG (21) TBPK tablet Take 1 tablet (10 mg total) by mouth daily. 4 tabs PO x 1 day 3 tabs PO x 1 day 2 tabs PO x 1 day 1 tab PO x 1 day and stop  06/28/16   Gladstone Lighter, MD  rOPINIRole (REQUIP) 3 MG tablet Take 3 mg by mouth at bedtime.    Historical Provider, MD  traZODone (DESYREL) 100 MG tablet Take 200 mg by mouth at bedtime.    Historical Provider, MD  Venlafaxine HCl 225 MG TB24 Take 225 mg by mouth daily.    Historical Provider, MD  warfarin (COUMADIN) 6 MG tablet Take 6 mg by mouth daily.    Historical Provider, MD    Allergies Amiodarone  Family History  Problem Relation Age of Onset  . Heart attack Father   . Heart failure Father   . Arthritis Father   . Stroke Father   . Hypertension Father   . Coronary artery disease Brother   . Peripheral vascular disease Brother   . Arthritis Mother   . Colon cancer Mother     colon cancer  . Hypertension Mother   . Cancer Maternal Grandmother     colon cancer  .  Arthritis Maternal Grandmother     Social History Social History  Substance Use Topics  . Smoking status: Former Smoker    Packs/day: 0.50    Years: 15.00    Types: Cigarettes  . Smokeless tobacco: Never Used     Comment: Has a 20-pack-year history, qutting in 1970.   Marland Kitchen Alcohol use No    Review of Systems  Constitutional: Negative for fever. Cardiovascular: Negative for chest pain. Respiratory: Negative for shortness of breath. Gastrointestinal: Negative for abdominal pain, vomiting and diarrhea. Neurological: Negative for headaches, focal weakness or numbness.  10-point ROS otherwise negative.  ____________________________________________   PHYSICAL EXAM:  VITAL SIGNS: ED Triage Vitals  Enc Vitals Group     BP 03/27/17 1846 (!) 96/41     Pulse Rate 03/27/17 1846 94     Resp 03/27/17 1846 20     Temp 03/27/17 1846 97.9 F (36.6 C)     Temp Source 03/27/17 1846 Oral     SpO2 03/27/17 1846 96 %     Weight 03/27/17 1848 237 lb (107.5 kg)     Height 03/27/17 1848 5\' 9"  (1.753 m)     Head Circumference --      Peak Flow --      Pain Score 03/27/17 1845 7    Constitutional: Alert and  oriented. Well appearing and in no distress. Eyes: Conjunctivae are normal. Normal extraocular movements. ENT   Head: Normocephalic and atraumatic.   Nose: No congestion/rhinnorhea.   Mouth/Throat: Both active bleeding and blood clots noted in the oropharynx around site of tooth extractions.    Neck: No stridor. Hematological/Lymphatic/Immunilogical: No cervical lymphadenopathy. Cardiovascular: Normal rate, regular rhythm.  No murmurs, rubs, or gallops.  Respiratory: Normal respiratory effort without tachypnea nor retractions. Breath sounds are clear and equal bilaterally. No wheezes/rales/rhonchi. Gastrointestinal: Soft and non tender. No rebound. No guarding.  Genitourinary: Deferred Musculoskeletal: Normal range of motion in all extremities. No lower extremity edema. Neurologic:  Normal speech and language. No gross focal neurologic deficits are appreciated.  Skin:  Skin is warm, dry and intact. No rash noted. Psychiatric: Mood and affect are normal. Speech and behavior are normal. Patient exhibits appropriate insight and judgment.  ____________________________________________    LABS (pertinent positives/negatives)  Labs Reviewed  PROTIME-INR - Abnormal; Notable for the following:       Result Value   Prothrombin Time 47.1 (*)    INR 4.90 (*)    All other components within normal limits  CBC WITH DIFFERENTIAL/PLATELET - Abnormal; Notable for the following:    WBC 20.2 (*)    RBC 2.45 (*)    Hemoglobin 5.9 (*)    HCT 19.8 (*)    MCH 24.2 (*)    MCHC 30.0 (*)    RDW 22.5 (*)    Neutro Abs 17.9 (*)    Lymphs Abs 0.5 (*)    Monocytes Absolute 1.7 (*)    All other components within normal limits  BASIC METABOLIC PANEL - Abnormal; Notable for the following:    Sodium 131 (*)    Potassium 5.3 (*)    Chloride 90 (*)    Glucose, Bld 217 (*)    BUN 91 (*)    Creatinine, Ser 1.56 (*)    Calcium 8.3 (*)    GFR calc non Af Amer 33 (*)    GFR calc Af Amer 38 (*)     Anion gap 18 (*)    All other components within normal limits  PROTIME-INR -  Abnormal; Notable for the following:    Prothrombin Time 37.2 (*)    All other components within normal limits  PROTIME-INR  PROTIME-INR  PROTIME-INR  TYPE AND SCREEN  ABO/RH  PREPARE RBC (CROSSMATCH)     ____________________________________________   EKG  None  ____________________________________________    RADIOLOGY  None   ____________________________________________   PROCEDURES  Procedures  CRITICAL CARE Performed by: Nance Pear   Total critical care time: 45 minutes  Critical care time was exclusive of separately billable procedures and treating other patients.  Critical care was necessary to treat or prevent imminent or life-threatening deterioration.  Critical care was time spent personally by me on the following activities: development of treatment plan with patient and/or surrogate as well as nursing, discussions with consultants, evaluation of patient's response to treatment, examination of patient, obtaining history from patient or surrogate, ordering and performing treatments and interventions, ordering and review of laboratory studies, ordering and review of radiographic studies, pulse oximetry and re-evaluation of patient's condition.  ____________________________________________   INITIAL IMPRESSION / ASSESSMENT AND PLAN / ED COURSE  Pertinent labs & imaging results that were available during my care of the patient were reviewed by me and considered in my medical decision making (see chart for details).  She presents to the emergency department today with concerns for bleeding from site of recent dental surgery. Patient is on warfarin and did not stop warfarin prior to tooth extraction. Started bleeding today. Patient was hypotensive upon arrival to the emergency department. Given active bleeding and hypotension patient was present for and given emergent blood  transfusions. Additionally patient stated she was on warfarin. She told me that she was on it for A. fib however does appear that she also has mitral valve replacement. Patient was given [facility] and vitamin K to try to reverse warfarin and INR did return supratherapeutic. Patient's bleeding did seem to slow down after reversal. The patient's blood pressure continued to be soft here in the emergency department. Patient will be admitted to the hospitalist service.  ____________________________________________   FINAL CLINICAL IMPRESSION(S) / ED DIAGNOSES  Final diagnoses:  Acute blood loss anemia  Hypotension, unspecified hypotension type  Elevated INR  Status post tooth extraction     Note: This dictation was prepared with Dragon dictation. Any transcriptional errors that result from this process are unintentional     Nance Pear, MD 03/27/17 2129

## 2017-03-27 NOTE — ED Notes (Signed)
First unit of Blood complete.

## 2017-03-27 NOTE — ED Triage Notes (Signed)
Pt arrives via [facility] from home patient had oral surgery yesterday. Patient with uncontrolled hemorrhage. Blood pressure 96/41 upon arrival

## 2017-03-27 NOTE — ED Notes (Addendum)
Emergent Blood given 2nd unit.  Unit #: [id number] K O Positive Exp 04/11/17 Volume 320 mL  Verified by Raquel RN & Fara Chute RN

## 2017-03-27 NOTE — ED Notes (Signed)
This RN and Vivien Rota ED Tech in to clean patient due to bm. Patient cleaned. After cleaning patient she began to vomit. Rolled patient on side, Vivien Rota went to get EDP for orders. Charge RN came in with zofran 4mg /93ml IV per EDP verbal order.

## 2017-03-27 NOTE — H&P (Signed)
[facility] [place] NAME: Betty [name]    MR#:  [id number]   DATE OF BIRTH:  December 03, 1946  DATE OF ADMISSION:  03/27/2017  PRIMARY CARE PHYSICIAN: Dion Body, MD   REQUESTING/REFERRING PHYSICIAN: Archie Balboa, MD  CHIEF COMPLAINT:   Chief Complaint  Patient presents with  . Hypotension  . Dental Injury    HISTORY OF PRESENT ILLNESS:  Betty [name]  is a 72 y.o. female who presents with Oral hemorrhage after dental extraction. Patient had 4 teeth extracted a dentist office earlier today, and had significant hemorrhage afterwards. Here in the ED she was initially hypotensive and had hemoglobin of around 5. Patient is on warfarin for chronic anticoagulation for mechanical mitral valve and atrial fibrillation.  Her INR initially in the ED today was supratherapeutic at 4.9. She was given reversal agents, 2 units of blood and hospitalists were called for admission.  PAST MEDICAL HISTORY:   Past Medical History:  Diagnosis Date  . Acute diastolic heart failure ([place])   . Allergy   . 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 Asthma   . Atrial fibrillation ([place])   . Backache, unspecified   . Cardiomegaly   . COPD (chronic obstructive pulmonary disease) ([place])   . Diabetes mellitus without complication ([place])   . 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 Esophageal reflux   . Headache(784.0)   . Herpes zoster without mention of complication   . Hx: UTI (urinary tract infection)   . Hypertension    heart controlled w CHF  . Nontoxic uninodular goiter   . Obesity, unspecified   . Osteoarthrosis, unspecified whether generalized or localized, unspecified site   . Unspecified sleep apnea   . Urine incontinence    hx of    PAST SURGICAL HISTORY:   Past Surgical History:  Procedure Laterality Date  . ABDOMINAL HYSTERECTOMY  1979   complete (for precancerous cells)  . ABLATION  2011 & 2014  . CHOLECYSTECTOMY     . OOPHORECTOMY      SOCIAL HISTORY:   Social History  Substance Use Topics  . Smoking status: Former Smoker    Packs/day: 0.50    Years: 15.00    Types: Cigarettes  . Smokeless tobacco: Never Used     Comment: Has a 20-pack-year history, qutting in 1970.   Marland Kitchen Alcohol use No    FAMILY HISTORY:   Family History  Problem Relation Age of Onset  . Heart attack Father   . Heart failure Father   . Arthritis Father   . Stroke Father   . Hypertension Father   . Coronary artery disease Brother   . Peripheral vascular disease Brother   . Arthritis Mother   . Colon cancer Mother     colon cancer  . Hypertension Mother   . Cancer Maternal Grandmother     colon cancer  . Arthritis Maternal Grandmother     DRUG ALLERGIES:   Allergies  Allergen Reactions  . Amiodarone Other (See Comments)    Pt states that this medication causes lung bleeding.      MEDICATIONS AT HOME:   Prior to Admission medications   Medication Sig Start Date End Date Taking? Authorizing Provider  albuterol (PROVENTIL HFA;VENTOLIN HFA) 108 (90 Base) MCG/ACT inhaler Inhale 2 puffs into the lungs every 4 (four) hours as needed for wheezing or shortness of breath. 01/17/16  Yes Flora Lipps, MD  albuterol (PROVENTIL) (2.5 MG/3ML)  0.083% nebulizer solution Take 3 mLs (2.5 mg total) by nebulization every 4 (four) hours as needed for wheezing or shortness of breath. 06/28/16  Yes Gladstone Lighter, MD  aspirin EC 81 MG tablet Take 81 mg by mouth daily.   Yes Historical Provider, MD  atorvastatin (LIPITOR) 20 MG tablet Take 20 mg by mouth daily.   Yes Historical Provider, MD  azaTHIOprine (IMURAN) 50 MG tablet Take 100 mg by mouth daily.    Yes Historical Provider, MD  diltiazem (CARDIZEM LA) 180 MG 24 hr tablet Take 180 mg by mouth daily.  06/17/16  Yes Historical Provider, MD  ferrous sulfate 325 (65 FE) MG tablet Take 325 mg by mouth daily with breakfast.   Yes Historical Provider, MD  gabapentin (NEURONTIN) 600 MG  tablet Take 600 mg by mouth 2 (two) times daily.   Yes Historical Provider, MD  metFORMIN (GLUCOPHAGE) 1000 MG tablet Take 1,000 mg by mouth 2 (two) times daily with a meal.    Yes Historical Provider, MD  metolazone (ZAROXOLYN) 2.5 MG tablet Take 2.5 mg by mouth 2 (two) times a week. prn   Yes Historical Provider, MD  metoprolol succinate (TOPROL-XL) 25 MG 24 hr tablet Take 25 mg by mouth daily.   Yes Historical Provider, MD  pantoprazole (PROTONIX) 40 MG tablet Take 40 mg by mouth daily.   Yes Historical Provider, MD  potassium chloride (K-DUR) 10 MEQ tablet Take 2 tablets (20 mEq total) by mouth daily. While on lasix 06/27/16  Yes Gladstone Lighter, MD  propafenone (RYTHMOL) 225 MG tablet Take 225 mg by mouth every 12 (twelve) hours.   Yes Historical Provider, MD  rOPINIRole (REQUIP) 4 MG tablet Take 4 mg by mouth at bedtime.    Yes Historical Provider, MD  spironolactone (ALDACTONE) 50 MG tablet Take 50 mg by mouth daily.   Yes Historical Provider, MD  torsemide (DEMADEX) 20 MG tablet Take 80 mg by mouth 2 (two) times daily.    Yes Historical Provider, MD  traZODone (DESYREL) 100 MG tablet Take 200 mg by mouth at bedtime.   Yes Historical Provider, MD  Venlafaxine HCl 225 MG TB24 Take 225 mg by mouth daily.   Yes Historical Provider, MD  warfarin (COUMADIN) 5 MG tablet Take 5 mg by mouth daily. 5mg  everyday except THURSDAY   Yes Historical Provider, MD  warfarin (COUMADIN) 7.5 MG tablet Take 7.5 mg by mouth daily. ONLY on thursday   Yes Historical Provider, MD  azithromycin (ZITHROMAX Z-PAK) 250 MG tablet Take 2 tabs on day 1 And then 1 tab every day until finished. Patient not taking: Reported on 03/27/2017 06/27/16   Gladstone Lighter, MD  Biotin 5000 MCG CAPS Take 5,000 mcg by mouth daily.     Historical Provider, MD  calcium-vitamin D (OSCAL WITH D) 500-200 MG-UNIT per tablet Take 1 tablet by mouth daily.    Historical Provider, MD  chlorpheniramine-HYDROcodone (TUSSIONEX) 10-8 MG/5ML SUER Take  5 mLs by mouth every 12 (twelve) hours. Patient not taking: Reported on 03/27/2017 06/27/16   Gladstone Lighter, MD  furosemide (LASIX) 40 MG tablet Take 40 mg by mouth daily.    Historical Provider, MD  HYDROcodone-acetaminophen (NORCO/VICODIN) 5-325 MG tablet Take 1 tablet by mouth every 8 (eight) hours as needed for moderate pain.    Historical Provider, MD  oxybutynin (DITROPAN) 5 MG tablet Take 5 mg by mouth 2 (two) times daily.    Historical Provider, MD  predniSONE (STERAPRED UNI-PAK 21 TAB) 10 MG (21) TBPK tablet  Take 1 tablet (10 mg total) by mouth daily. 4 tabs PO x 1 day 3 tabs PO x 1 day 2 tabs PO x 1 day 1 tab PO x 1 day and stop Patient not taking: Reported on 03/27/2017 06/28/16   Gladstone Lighter, MD    REVIEW OF SYSTEMS:  Review of Systems  Constitutional: Negative for chills, fever, malaise/fatigue and weight loss.  HENT: Negative for ear pain, hearing loss and tinnitus.   Eyes: Negative for blurred vision, double vision, pain and redness.  Respiratory: Negative for cough, hemoptysis and shortness of breath.   Cardiovascular: Negative for chest pain, palpitations, orthopnea and leg swelling.  Gastrointestinal: Negative for abdominal pain, constipation, diarrhea, nausea and vomiting.  Genitourinary: Negative for dysuria, frequency and hematuria.  Musculoskeletal: Negative for back pain, joint pain and neck pain.  Skin:       No acne, rash, or lesions  Neurological: Negative for dizziness, tremors, focal weakness and weakness.  Endo/Heme/Allergies: Negative for polydipsia. Bruises/bleeds easily.       Significant oral postprocedural hemorrhage  Psychiatric/Behavioral: Negative for depression. The patient is not nervous/anxious and does not have insomnia.      VITAL SIGNS:   Vitals:   03/27/17 1945 03/27/17 2000 03/27/17 2030 03/27/17 2045  BP: (!) 95/47 (!) 97/54 (!) 96/55 (!) 110/48  Pulse: 75 76 100 95  Resp: (!) 22 18 19 15   Temp:      TempSrc:      SpO2: 99% 100%  91% 97%  Weight:      Height:       Wt Readings from Last 3 Encounters:  03/27/17 107.5 kg (237 lb)  06/28/16 113.2 kg (249 lb 9.6 oz)  05/10/16 108.4 kg (239 lb)    PHYSICAL EXAMINATION:  Physical Exam  Vitals reviewed. Constitutional: She is oriented to person, place, and time. She appears well-developed and well-nourished. No distress.  HENT:  Head: Normocephalic and atraumatic.  Mouth/Throat: Oropharynx is clear and moist.  Eyes: Conjunctivae and EOM are normal. Pupils are equal, round, and reactive to light. No scleral icterus.  Neck: Normal range of motion. Neck supple. No JVD present. No thyromegaly present.  Cardiovascular: Normal rate, regular rhythm and intact distal pulses.  Exam reveals no gallop and no friction rub.   No murmur heard. Click heart sound heard her mitral region  Respiratory: Effort normal and breath sounds normal. No respiratory distress. She has no wheezes. She has no rales.  GI: Soft. Bowel sounds are normal. She exhibits no distension. There is no tenderness.  Musculoskeletal: Normal range of motion. She exhibits no edema.  No arthritis, no gout  Lymphadenopathy:    She has no cervical adenopathy.  Neurological: She is alert and oriented to person, place, and time. No cranial nerve deficit.  No dysarthria, no aphasia  Skin: Skin is warm and dry. No rash noted. No erythema.  Subcutaneous bruising in the perioral region  Psychiatric: She has a normal mood and affect. Her behavior is normal. Judgment and thought content normal.    LABORATORY PANEL:   CBC  Recent Labs Lab 03/27/17 1857  WBC 20.2*  HGB 5.9*  HCT 19.8*  PLT 367   ------------------------------------------------------------------------------------------------------------------  Chemistries   Recent Labs Lab 03/27/17 1857  NA 131*  K 5.3*  CL 90*  CO2 23  GLUCOSE 217*  BUN 91*  CREATININE 1.56*  CALCIUM 8.3*    ------------------------------------------------------------------------------------------------------------------  Cardiac Enzymes No results for input(s): TROPONINI in the last 168 hours. ------------------------------------------------------------------------------------------------------------------  RADIOLOGY:  No results found.  EKG:   Orders placed or performed during the hospital encounter of 06/25/16  . ED EKG  . ED EKG  . EKG    IMPRESSION AND PLAN:  Principal Problem:   Postprocedural hemorrhage due to complication of oral surgery - patient was given reversal agents, K Centra and vitamin K.  Hemorrhage is currently controlled at this time.  We will continue to monitor closely, and treat other problems as below. Active Problems:   Acute blood loss anemia - due to hemorrhage, patient got 2 units of blood and hemorrhage control was achieved as above.   H/O mitral valve replacement with mechanical valve - patient's INR reversed down to almost normal range after reversal agents, she needs to stay above 2.5. I have started her on a heparin drip for bridging. Pharmacy was consulted to assist in heparin dosing and monitoring as well as warfarin dosing and monitoring.   Diabetes ([place]) - sliding scale insulin with corresponding glucose checks   Essential hypertension, benign - hold antihypertensives as patient was hypotensive when she came in initially. IV fluids for blood pressure support   Diastolic congestive heart failure ([facility]) - monitor closely much continue home meds   A-fib ([place]) - continue home rate controlling medications and anticoagulation as above   GERD - home dose PPI  All the records are reviewed and case discussed with ED provider. Management plans discussed with the patient and/or family.  DVT PROPHYLAXIS: Systemic anticoagulation  GI PROPHYLAXIS: PPI  ADMISSION STATUS: Inpatient  CODE STATUS: Full Code Status History    Date Active Date Inactive Code  Status Order ID Comments User Context   06/26/2016  1:06 AM 06/28/2016  4:39 PM Full Code [id number]  Lance Coon, MD Inpatient   05/10/2016  4:38 PM 05/12/2016  4:59 PM Full Code [id number]  Theodoro Grist, MD ED   04/01/2016  5:13 PM 04/03/2016  5:58 AM Full Code [id number]  Henreitta Leber, MD Inpatient      TOTAL TIME TAKING CARE OF THIS PATIENT: 45 minutes.    Taisha Pennebaker FIELDING 03/27/2017, 9:49 PM  [facility]  Office  [phone]  CC: Primary care physician; Dion Body, MD

## 2017-03-27 NOTE — ED Notes (Addendum)
Emergent Blood started.  Unit # [id number]  Red cells, LR  O Positive.  Exp date 04/11/17  Volume 350 mL   Verified by [place] RN

## 2017-03-27 NOTE — ED Notes (Signed)
This RN and Aldona Bar, RN verified emergency blood administration at 19:00.  Primary RN and MD made aware.  Patient tolerated blood transfusion with no difficulty noted.

## 2017-03-27 NOTE — ED Notes (Signed)
Patient informed about emergent blood. Explained the risk and benefits of blood administration.

## 2017-03-28 LAB — BASIC METABOLIC PANEL
Anion gap: 9 (ref 5–15)
BUN: 75 mg/dL — AB (ref 6–20)
CHLORIDE: 93 mmol/L — AB (ref 101–111)
CO2: 32 mmol/L (ref 22–32)
CREATININE: 0.99 mg/dL (ref 0.44–1.00)
Calcium: 8.2 mg/dL — ABNORMAL LOW (ref 8.9–10.3)
GFR calc non Af Amer: 56 mL/min — ABNORMAL LOW (ref >60)
Glucose, Bld: 173 mg/dL — ABNORMAL HIGH (ref 65–99)
POTASSIUM: 4.1 mmol/L (ref 3.5–5.1)
Sodium: 134 mmol/L — ABNORMAL LOW (ref 135–145)

## 2017-03-28 LAB — HEMOGLOBIN
Hemoglobin: 9.1 g/dL — ABNORMAL LOW (ref 12.0–16.0)
Hemoglobin: 8.2 g/dL — ABNORMAL LOW (ref 12.0–16.0)

## 2017-03-28 LAB — CBC
HEMATOCRIT: 26.3 % — AB (ref 35.0–47.0)
Hemoglobin: 8.6 g/dL — ABNORMAL LOW (ref 12.0–16.0)
MCH: 26 pg (ref 26.0–34.0)
MCHC: 32.6 g/dL (ref 32.0–36.0)
MCV: 79.7 fL — AB (ref 80.0–100.0)
PLATELETS: 276 10*3/uL (ref 150–440)
RBC: 3.3 MIL/uL — AB (ref 3.80–5.20)
RDW: 19.7 % — ABNORMAL HIGH (ref 11.5–14.5)
WBC: 23.8 10*3/uL — ABNORMAL HIGH (ref 3.6–11.0)

## 2017-03-28 LAB — PROTIME-INR
INR: 1.19
PROTHROMBIN TIME: 15.2 s (ref 11.4–15.2)

## 2017-03-28 LAB — HEPARIN LEVEL (UNFRACTIONATED)
HEPARIN UNFRACTIONATED: 0.46 [IU]/mL (ref 0.30–0.70)
Heparin Unfractionated: 0.4 IU/mL (ref 0.30–0.70)

## 2017-03-28 LAB — APTT: aPTT: 29 seconds (ref 24–36)

## 2017-03-28 LAB — GLUCOSE, CAPILLARY
Glucose-Capillary: 168 mg/dL — ABNORMAL HIGH (ref 65–99)
Glucose-Capillary: 205 mg/dL — ABNORMAL HIGH (ref 65–99)
Glucose-Capillary: 183 mg/dL — ABNORMAL HIGH (ref 65–99)

## 2017-03-28 LAB — PREPARE RBC (CROSSMATCH)

## 2017-03-28 LAB — MAGNESIUM: Magnesium: 1.6 mg/dL — ABNORMAL LOW (ref 1.7–2.4)

## 2017-03-28 MED ORDER — METOPROLOL TARTRATE 25 MG PO TABS
25.0000 mg | ORAL_TABLET | Freq: Two times a day (BID) | ORAL | Status: DC
  Administered 2017-03-28 – 2017-03-29 (×3): 25 mg via ORAL
  Filled 2017-03-28 (×3): qty 1

## 2017-03-28 MED ORDER — HYDROCODONE-ACETAMINOPHEN 5-325 MG PO TABS
1.0000 | ORAL_TABLET | ORAL | Status: DC | PRN
  Administered 2017-03-28 – 2017-04-07 (×22): 1 via ORAL
  Filled 2017-03-28 (×23): qty 1

## 2017-03-28 MED ORDER — MAGNESIUM SULFATE 2 GM/50ML IV SOLN
2.0000 g | Freq: Once | INTRAVENOUS | Status: AC
  Administered 2017-03-28: 2 g via INTRAVENOUS
  Filled 2017-03-28: qty 50

## 2017-03-28 MED ORDER — TRAZODONE HCL 100 MG PO TABS
200.0000 mg | ORAL_TABLET | Freq: Every day | ORAL | Status: DC
  Administered 2017-03-28 – 2017-04-08 (×12): 200 mg via ORAL
  Filled 2017-03-28 (×12): qty 2

## 2017-03-28 MED ORDER — MAGNESIUM SULFATE 2 GM/50ML IV SOLN
2.0000 g | Freq: Once | INTRAVENOUS | Status: DC
  Filled 2017-03-28: qty 50

## 2017-03-28 MED ORDER — WARFARIN SODIUM 5 MG PO TABS
5.0000 mg | ORAL_TABLET | Freq: Every day | ORAL | Status: DC
  Administered 2017-03-28: 5 mg via ORAL
  Filled 2017-03-28: qty 1

## 2017-03-28 NOTE — Progress Notes (Signed)
Patient ID: Betty [name], female   DOB: [birth year], 71 y.o.   MRN: [id number]  [facility] PROGRESS NOTE  AROUSH [name] QMG:[id number] DOB: 12/11/1946 DOA: 03/27/2017 PCP: Betty Body, MD  HPI/Subjective: Patient had 4 teeth pulled and had hemorrhage after procedure. She takes Coumadin for metallic valve. In the ER her initial INR was 4.9. She was given 10 units of IV vitamin K and Kcentra by the ER physician. She needed to be started on heparin drip and now with Coumadin dosing. The bleeding had stopped. She still not feeling well. She went into atrial fibrillation.  Objective: Vitals:   03/28/17 0523 03/28/17 1057  BP: (!) 101/52 (!) 105/55  Pulse: 96 99  Resp: 18   Temp: 98.3 F (36.8 C)     Filed Weights   03/27/17 1848 03/27/17 2301 03/28/17 0523  Weight: 107.5 kg (237 lb) 104.1 kg (229 lb 6.4 oz) 103.6 kg (228 lb 4.8 oz)    ROS: Review of Systems  Constitutional: Negative for chills and fever.  Eyes: Negative for blurred vision.  Respiratory: Negative for cough and shortness of breath.   Cardiovascular: Negative for chest pain.  Gastrointestinal: Negative for abdominal pain, constipation, diarrhea, nausea and vomiting.  Genitourinary: Negative for dysuria.  Musculoskeletal: Negative for joint pain.  Neurological: Positive for weakness. Negative for dizziness and headaches.   Exam: Physical Exam  Constitutional: She is oriented to person, place, and time.  HENT:  Nose: No mucosal edema.  Mouth/Throat: No oropharyngeal exudate or posterior oropharyngeal edema.  Eyes: Conjunctivae, EOM and lids are normal. Pupils are equal, round, and reactive to light.  Neck: No JVD present. Carotid bruit is not present. No edema present. No thyroid mass and no thyromegaly present.  Cardiovascular: S1 normal and S2 normal.  Exam reveals no gallop.   No murmur heard. Pulses:      Dorsalis pedis pulses are 2+ on the right side, and 2+ on the left side.  Respiratory: No  respiratory distress. She has no wheezes. She has no rhonchi. She has no rales.  GI: Soft. Bowel sounds are normal. There is no tenderness.  Musculoskeletal:       Right ankle: She exhibits no swelling.       Left ankle: She exhibits no swelling.  Lymphadenopathy:    She has no cervical adenopathy.  Neurological: She is alert and oriented to person, place, and time. No cranial nerve deficit.  Skin: Skin is warm. No rash noted. Nails show no clubbing.  Bruising bilateral lower face and neck  Psychiatric: She has a normal mood and affect.      Data Reviewed: Basic Metabolic Panel:  Recent Labs Lab 03/27/17 1857 03/28/17 0438 03/28/17 0820  NA 131* 134*  --   K 5.3* 4.1  --   CL 90* 93*  --   CO2 23 32  --   GLUCOSE 217* 173*  --   BUN 91* 75*  --   CREATININE 1.56* 0.99  --   CALCIUM 8.3* 8.2*  --   MG  --   --  1.6*   CBC:  Recent Labs Lab 03/27/17 1857 03/27/17 2258 03/28/17 0438 03/28/17 0820  WBC 20.2*  --  23.8*  --   NEUTROABS 17.9*  --   --   --   HGB 5.9* 9.1* 8.6* 8.2*  HCT 19.8*  --  26.3*  --   MCV 80.6  --  79.7*  --   PLT 367  --  276  --  BNP (last 3 results)  Recent Labs  04/01/16 1244 04/02/16 0413 07/26/16 1159  BNP 126.0* 237.0* 504.0*    CBG:  Recent Labs Lab 03/27/17 2307 03/28/17 0753 03/28/17 1140  GLUCAP 176* 168* 205*     Scheduled Meds: . atorvastatin  20 mg Oral Daily  . azaTHIOprine  100 mg Oral Daily  . gabapentin  600 mg Oral BID  . insulin aspart  0-5 Units Subcutaneous QHS  . insulin aspart  0-9 Units Subcutaneous TID WC  . metoprolol tartrate  25 mg Oral BID  . oxybutynin  5 mg Oral BID  . pantoprazole  40 mg Oral Daily  . rOPINIRole  4 mg Oral QHS  . sodium chloride flush  3 mL Intravenous Q12H  . venlafaxine XR  225 mg Oral Daily  . warfarin  5 mg Oral q1800   Continuous Infusions: . heparin 1,300 Units/hr (03/28/17 0153)    Assessment/Plan:  1. Acute blood loss anemia. Hemorrhage after oral  surgery procedure to remove 4 teeth. The patient was transfused 2 units of packed red blood cells. Continue to monitor hemoglobin. 2. Metallic valve. Patient's Coumadin was reversed with 10 mg of IV vitamin K and Kcentra. It will likely take a while to get the patient's Coumadin level up again. On IV heparin and pharmacy dosing Coumadin. Daily INR checks 3. Atrial fibrillation with rapid ventricular response. Started on metoprolol 4. Hypomagnesemia. IV magnesium given 5. Hyperlipidemia unspecified on atorvastatin 6. 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 Type 2 diabetes mellitus on sliding scale 8. Urinary incontinence on oxybutynin 9. Restless leg syndrome on Requip 10. We'll get physical therapy evaluation. Okay to walk while on heparin drip  Code Status:     Code Status Orders        Start     Ordered   03/27/17 2252  Full code  Continuous     03/27/17 2251    Code Status History    Date Active Date Inactive Code Status Order ID Comments User Context   06/26/2016  1:06 AM 06/28/2016  4:39 PM Full Code [id number]  Betty Coon, MD Inpatient   05/10/2016  4:38 PM 05/12/2016  4:59 PM Full Code [id number]  Theodoro Grist, MD ED   04/01/2016  5:13 PM 04/03/2016  5:58 AM Full Code [id number]  Henreitta Leber, MD Inpatient     Disposition Plan: Likely will be with [place] for a while since they reverse the Coumadin with 10 mg of IV vitamin K. Need to have Coumadin level therapeutic prior to going home  Time spent: 28 minutes  Betty [name], [employer]

## 2017-03-28 NOTE — Progress Notes (Signed)
Central tele called to report that pt had a second degree type 2 HB, MD Marcille Blanco made aware, MD will put in for a cardiology consult.

## 2017-03-28 NOTE — Progress Notes (Signed)
ANTICOAGULATION CONSULT NOTE - Initial Consult  Pharmacy Consult for warfarin dosing with heparin drip bridge Indication: mechanical mitral valve/atrial fibrillation  Allergies  Allergen Reactions  . Amiodarone Other (See Comments)    Pt states that this medication causes lung bleeding.      Patient Measurements: Height: 5\' 9"  (175.3 cm) Weight: 228 lb 4.8 oz (103.6 kg) IBW/kg (Calculated) : 66.2 Heparin Dosing Weight: 90kg  Vital Signs: Temp: 98.3 F (36.8 C) (04/06 0523) Temp Source: Oral (04/06 0523) BP: 105/55 (04/06 1057) Pulse Rate: 99 (04/06 1057)  Labs:  Recent Labs  03/27/17 1857  03/27/17 2137 03/27/17 2258 03/28/17 0438 03/28/17 0820 03/28/17 1422  HGB 5.9*  --   --  9.1* 8.6* 8.2*  --   HCT 19.8*  --   --   --  26.3*  --   --   PLT 367  --   --   --  276  --   --   APTT  --   --   --  29  --   --   --   LABPROT 47.1*  < > 16.4* 15.4* 15.2  --   --   INR 4.90*  < > 1.31 1.21 1.19  --   --   HEPARINUNFRC  --   --   --   --   --  0.40 0.46  CREATININE 1.56*  --   --   --  0.99  --   --   < > = values in this interval not displayed.  Estimated Creatinine Clearance: 67.8 mL/min (by C-G formula based on SCr of 0.99 mg/dL).   Medical History: Past Medical History:  Diagnosis Date  . Acute diastolic heart failure ([place])   . Allergy   . 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 Asthma   . Atrial fibrillation ([place])   . Backache, unspecified   . Cardiomegaly   . COPD (chronic obstructive pulmonary disease) ([place])   . Diabetes mellitus without complication ([place])   . 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 Esophageal reflux   . Headache(784.0)   . Herpes zoster without mention of complication   . Hx: UTI (urinary tract infection)   . Hypertension    heart controlled w CHF  . Nontoxic uninodular goiter   . Obesity, unspecified   . Osteoarthrosis, unspecified whether generalized or localized, unspecified site   . Unspecified sleep  apnea   . Urine incontinence    hx of    Medications:  Patient on warfarin at home 5 mg every day except Thursday, 7.5 mg on Thursdays.  Assessment: Patient admitted on 4/5 with significant bleeding from mouth s/p extraction of 4 teeth. Patient was hypotensive on admission with a Hgb of 5.9 and INR of 4.9. She received vitamin K and KCentra in ED on 4/5. Appears patient recently was taking azithromycin per med rec.  Patient was taking warfarin prior to admission for a mechanical mitral valve as well as atrial fibrillation. Once bleeding stopped, patient was restarted on warfarin with a heparin drip bridge. INR subtherapeutic post-KCentra and vitamin K. Post KCentra INR ordered per protocol was discontinued by MD.  Dosing history: Date INR Dose Comments 4/5 4.9 -- KCentra and vitamin K 4/6 1.19  Goal of Therapy:  INR 2.5-3.5 Heparin level 0.3-0.7. Monitor platelets by anticoagulation protocol: Yes   Plan:  Confirmatory HL = 0.46 (therapeutic). Will continue heparin drip at current rate of 1300 units/hr recheck CBC  and HL with AM labs tomorrow. Confirmed no signs/symptoms of bleeding with RN today.  Will order warfarin 5 mg PO dose this evening - essentially new start after reversal so expect it will take several days to see increase in INR.  Lenis Noon, PharmD, BCPS Clinical Pharmacist 03/28/2017,2:58 PM

## 2017-03-28 NOTE — Consult Note (Signed)
[facility] Consultation Note  Patient ID: Betty [name], MRN: [id number], DOB/AGE: 71-26-1947 71 y.o. Admit date: 03/27/2017   Date of Consult: 03/28/2017 Primary Physician: Dion Body, MD Primary Cardiologist: Tammi Klippel  Chief Complaint:  Chief Complaint  Patient presents with  . Hypotension  . Dental Injury   Reason for Consult: bleeding complication  HPI: 71 y.o. female with the known mitral valve disease status post mechanical mitral valve replacement in the past with diabetes and hyperlipidemia as well as paroxysmal nonvalvular atrial fibrillation status post previous ablation currently in the normal sinus rhythm without evidence of myocardial infarction or congestive heart failure. The patient had a dental procedure for which she had significant bleeding and significant anemia of which at the time she had her procedure her INR was 4.9. She has had resolution of her bleeding at this time and it appears to be improved with the status post packed red blood cells. The patient is been placed on heparin at this time for further risk reduction of thrombosis of prosthetic mechanical mitral valve and stable. There is currently no other symptoms at this time from the cardiovascular standpoint  Past Medical History:  Diagnosis Date  . Acute diastolic heart failure ([place])   . Allergy   . 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 Asthma   . Atrial fibrillation ([place])   . Backache, unspecified   . Cardiomegaly   . COPD (chronic obstructive pulmonary disease) ([place])   . Diabetes mellitus without complication ([place])   . 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 Esophageal reflux   . Headache(784.0)   . Herpes zoster without mention of complication   . Hx: UTI (urinary tract infection)   . Hypertension    heart controlled w CHF  . Nontoxic uninodular goiter   . Obesity, unspecified   . Osteoarthrosis, unspecified whether generalized or localized,  unspecified site   . Unspecified sleep apnea   . Urine incontinence    hx of      Surgical History:  Past Surgical History:  Procedure Laterality Date  . ABDOMINAL HYSTERECTOMY  1979   complete (for precancerous cells)  . ABLATION  2011 & 2014  . CHOLECYSTECTOMY    . OOPHORECTOMY       Home Meds: Prior to Admission medications   Medication Sig Start Date End Date Taking? Authorizing Provider  albuterol (PROVENTIL HFA;VENTOLIN HFA) 108 (90 Base) MCG/ACT inhaler Inhale 2 puffs into the lungs every 4 (four) hours as needed for wheezing or shortness of breath. 01/17/16  Yes Flora Lipps, MD  albuterol (PROVENTIL) (2.5 MG/3ML) 0.083% nebulizer solution Take 3 mLs (2.5 mg total) by nebulization every 4 (four) hours as needed for wheezing or shortness of breath. 06/28/16  Yes Gladstone Lighter, MD  aspirin EC 81 MG tablet Take 81 mg by mouth daily.   Yes Historical Provider, MD  atorvastatin (LIPITOR) 20 MG tablet Take 20 mg by mouth daily.   Yes Historical Provider, MD  azaTHIOprine (IMURAN) 50 MG tablet Take 100 mg by mouth daily.    Yes Historical Provider, MD  diltiazem (CARDIZEM LA) 180 MG 24 hr tablet Take 180 mg by mouth daily.  06/17/16  Yes Historical Provider, MD  ferrous sulfate 325 (65 FE) MG tablet Take 325 mg by mouth daily with breakfast.   Yes Historical Provider, MD  gabapentin (NEURONTIN) 600 MG tablet Take 600 mg by mouth 2 (two) times daily.   Yes Historical Provider, MD  metFORMIN (  GLUCOPHAGE) 1000 MG tablet Take 1,000 mg by mouth 2 (two) times daily with a meal.    Yes Historical Provider, MD  metolazone (ZAROXOLYN) 2.5 MG tablet Take 2.5 mg by mouth 2 (two) times a week. prn   Yes Historical Provider, MD  metoprolol succinate (TOPROL-XL) 25 MG 24 hr tablet Take 25 mg by mouth daily.   Yes Historical Provider, MD  pantoprazole (PROTONIX) 40 MG tablet Take 40 mg by mouth daily.   Yes Historical Provider, MD  potassium chloride (K-DUR) 10 MEQ tablet Take 2 tablets (20 mEq  total) by mouth daily. While on lasix 06/27/16  Yes Gladstone Lighter, MD  propafenone (RYTHMOL) 225 MG tablet Take 225 mg by mouth every 12 (twelve) hours.   Yes Historical Provider, MD  rOPINIRole (REQUIP) 4 MG tablet Take 4 mg by mouth at bedtime.    Yes Historical Provider, MD  spironolactone (ALDACTONE) 50 MG tablet Take 50 mg by mouth daily.   Yes Historical Provider, MD  torsemide (DEMADEX) 20 MG tablet Take 80 mg by mouth 2 (two) times daily.    Yes Historical Provider, MD  traZODone (DESYREL) 100 MG tablet Take 200 mg by mouth at bedtime.   Yes Historical Provider, MD  Venlafaxine HCl 225 MG TB24 Take 225 mg by mouth daily.   Yes Historical Provider, MD  warfarin (COUMADIN) 5 MG tablet Take 5 mg by mouth daily. 5mg  everyday except THURSDAY   Yes Historical Provider, MD  warfarin (COUMADIN) 7.5 MG tablet Take 7.5 mg by mouth daily. ONLY on thursday   Yes Historical Provider, MD  azithromycin (ZITHROMAX Z-PAK) 250 MG tablet Take 2 tabs on day 1 And then 1 tab every day until finished. Patient not taking: Reported on 03/27/2017 06/27/16   Gladstone Lighter, MD  Biotin 5000 MCG CAPS Take 5,000 mcg by mouth daily.     Historical Provider, MD  calcium-vitamin D (OSCAL WITH D) 500-200 MG-UNIT per tablet Take 1 tablet by mouth daily.    Historical Provider, MD  chlorpheniramine-HYDROcodone (TUSSIONEX) 10-8 MG/5ML SUER Take 5 mLs by mouth every 12 (twelve) hours. Patient not taking: Reported on 03/27/2017 06/27/16   Gladstone Lighter, MD  furosemide (LASIX) 40 MG tablet Take 40 mg by mouth daily.    Historical Provider, MD  HYDROcodone-acetaminophen (NORCO/VICODIN) 5-325 MG tablet Take 1 tablet by mouth every 8 (eight) hours as needed for moderate pain.    Historical Provider, MD  oxybutynin (DITROPAN) 5 MG tablet Take 5 mg by mouth 2 (two) times daily.    Historical Provider, MD  predniSONE (STERAPRED UNI-PAK 21 TAB) 10 MG (21) TBPK tablet Take 1 tablet (10 mg total) by mouth daily. 4 tabs PO x 1 day 3  tabs PO x 1 day 2 tabs PO x 1 day 1 tab PO x 1 day and stop Patient not taking: Reported on 03/27/2017 06/28/16   Gladstone Lighter, MD    Inpatient Medications:  . sodium chloride  10 mL/hr Intravenous Once  . atorvastatin  20 mg Oral Daily  . azaTHIOprine  100 mg Oral Daily  . gabapentin  600 mg Oral BID  . insulin aspart  0-5 Units Subcutaneous QHS  . insulin aspart  0-9 Units Subcutaneous TID WC  . oxybutynin  5 mg Oral BID  . pantoprazole  40 mg Oral Daily  . rOPINIRole  4 mg Oral QHS  . sodium chloride flush  3 mL Intravenous Q12H  . venlafaxine XR  225 mg Oral Daily  . warfarin  5 mg Oral q1800   . heparin 1,300 Units/hr (03/28/17 0153)    Allergies:  Allergies  Allergen Reactions  . Amiodarone Other (See Comments)    Pt states that this medication causes lung bleeding.      Social History   Social History  . Marital status: Widowed    Spouse name: N/A  . Number of children: 1  . Years of education: N/A   Occupational History  .   History Main Topics  . Smoking status: Former Smoker    Packs/day: 0.50    Years: 15.00    Types: Cigarettes  . Smokeless tobacco: Never Used     Comment: Has a 20-pack-year history, qutting in 1970.   Marland Kitchen Alcohol use No  . Drug use: No  . Sexual activity: Not Currently   Other Topics Concern  . Not on file   Social History Narrative   Lives in [place] with her husband. 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 Does not routinely exercise.      Family History  Problem Relation Age of Onset  . Heart attack Father   . Heart failure Father   . Arthritis Father   . Stroke Father   . Hypertension Father   . Coronary artery disease Brother   . Peripheral vascular disease Brother   . Arthritis Mother   . Colon cancer Mother     colon cancer  . Hypertension Mother   . Cancer Maternal Grandmother     colon cancer  . Arthritis Maternal Grandmother       Review of Systems Positive for Mouth pain Negative for: General:  chills, fever, night sweats or weight changes.  Cardiovascular: PND orthopnea syncope dizziness  Dermatological skin lesions rashes Respiratory: Cough congestion Urologic: Frequent urination urination at night and hematuria Abdominal: negative for nausea, vomiting, diarrhea, bright red blood per rectum, melena, or hematemesis Neurologic: negative for visual changes, and/or hearing changes  All other systems reviewed and are otherwise negative except as noted above.  Labs: No results for input(s): CKTOTAL, CKMB, TROPONINI in the last 72 hours. Lab Results  Component Value Date   WBC 23.8 (H) 03/28/2017   HGB 8.6 (L) 03/28/2017   HCT 26.3 (L) 03/28/2017   MCV 79.7 (L) 03/28/2017   PLT 276 03/28/2017    Recent Labs Lab 03/28/17 0438  NA 134*  K 4.1  CL 93*  CO2 32  BUN 75*  CREATININE 0.99  CALCIUM 8.2*  GLUCOSE 173*   Lab Results  Component Value Date   CHOL 153 03/07/2015   HDL 36.60 (L) 03/07/2015   LDLCALC 90 03/07/2015   TRIG 130.0 03/07/2015   No results found for: DDIMER  Radiology/Studies:  No results found.  EKG: Normal sinus rhythm  Weights: Filed Weights   03/27/17 1848 03/27/17 2301 03/28/17 0523  Weight: 107.5 kg (237 lb) 104.1 kg (229 lb 6.4 oz) 103.6 kg (228 lb 4.8 oz)     Physical Exam: Blood pressure (!) 101/52, pulse 96, temperature 98.3 F (36.8 C), temperature source Oral, resp. rate 18, height 5\' 9"  (1.753 m), weight 103.6 kg (228 lb 4.8 oz), SpO2 97 %. Body mass index is 33.71 kg/m. General: Well developed, well nourished, in no acute distress. Head eyes ears nose throat: Normocephalic, atraumatic, sclera non-icteric, no xanthomas, nares are without discharge. No apparent thyromegaly and/or mass  Lungs: Normal respiratory effort.  no wheezes, no rales, no  rhonchi.  Heart: RRR with normal Mechanical S1 S2. no murmur gallop, no rub, PMI is normal size and placement,  carotid upstroke normal without bruit, jugular venous pressure is normal Abdomen: Soft, non-tender, non-distended with normoactive bowel sounds. No hepatomegaly. No rebound/guarding. No obvious abdominal masses. Abdominal aorta is normal size without bruit Extremities: No edema. no cyanosis, no clubbing, no ulcers  Peripheral : 2+ bilateral upper extremity pulses, 2+ bilateral femoral pulses, 2+ bilateral dorsal pedal pulse Neuro: Alert and oriented. No facial asymmetry. No focal deficit. Moves all extremities spontaneously. Musculoskeletal: Normal muscle tone without kyphosis Psych:  Responds to questions appropriately with a normal affect.    Assessment: 71 year old female with diabetes hyperlipidemia and paroxysmal nonvalvular atrial fibrillation currently in normal sinus rhythm with a complication of dental surgery with bleeding and anemia due to supratherapeutic INR  Plan: 1. Continue heparin for further risk reduction of thrombosis of mechanical mitral valve 2. Follow INR until improved and would reinstate at safe. Status post bleeding complications for a goal INR above 2 but likely 2.5-3.5 3. No further cardiac diagnostics necessary at this time due to no evidence of myocardial infarction and/or heart failure 4. Reported care for bleeding complications and other mouth injury 5. Okay for discharge to home when the patient has therapeutic INR and or full resolution of bleeding complications with a reasonable hemoglobin and or is able to go on outpatient Lovenox when warfarin is being reinstated  Signed, Corey Skains M.D. [facility] 03/28/2017, 8:10 AM

## 2017-03-28 NOTE — Progress Notes (Signed)
[place] notified. Pt is having small runs of VT per CCMD. Orders for metoprolol 25mg  BID and to check serum magnesium. I will continue to assess.

## 2017-03-28 NOTE — Progress Notes (Signed)
Pts magnesium is 1.6. MD notified. Orders for 2 gr IVPB magnesium given. I will continue to assess.

## 2017-03-28 NOTE — Progress Notes (Addendum)
ANTICOAGULATION CONSULT NOTE - Initial Consult  Pharmacy Consult for warfarin dosing with heparin drip bridge Indication: mechanical mitral valve/atrial fibrillation  Allergies  Allergen Reactions  . Amiodarone Other (See Comments)    Pt states that this medication causes lung bleeding.      Patient Measurements: Height: 5\' 9"  (175.3 cm) Weight: 228 lb 4.8 oz (103.6 kg) IBW/kg (Calculated) : 66.2 Heparin Dosing Weight: 90kg  Vital Signs: Temp: 98.3 F (36.8 C) (04/06 0523) Temp Source: Oral (04/06 0523) BP: 105/55 (04/06 1057) Pulse Rate: 99 (04/06 1057)  Labs:  Recent Labs  03/27/17 1857  03/27/17 2137 03/27/17 2258 03/28/17 0438 03/28/17 0820  HGB 5.9*  --   --  9.1* 8.6* 8.2*  HCT 19.8*  --   --   --  26.3*  --   PLT 367  --   --   --  276  --   APTT  --   --   --  29  --   --   LABPROT 47.1*  < > 16.4* 15.4* 15.2  --   INR 4.90*  < > 1.31 1.21 1.19  --   HEPARINUNFRC  --   --   --   --   --  0.40  CREATININE 1.56*  --   --   --  0.99  --   < > = values in this interval not displayed.  Estimated Creatinine Clearance: 67.8 mL/min (by C-G formula based on SCr of 0.99 mg/dL).   Medical History: Past Medical History:  Diagnosis Date  . Acute diastolic heart failure ([place])   . Allergy   . 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 Asthma   . Atrial fibrillation ([place])   . Backache, unspecified   . Cardiomegaly   . COPD (chronic obstructive pulmonary disease) ([place])   . Diabetes mellitus without complication ([place])   . 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 Esophageal reflux   . Headache(784.0)   . Herpes zoster without mention of complication   . Hx: UTI (urinary tract infection)   . Hypertension    heart controlled w CHF  . Nontoxic uninodular goiter   . Obesity, unspecified   . Osteoarthrosis, unspecified whether generalized or localized, unspecified site   . Unspecified sleep apnea   . Urine incontinence    hx of     Medications:  Patient on warfarin at home 5 mg every day except Thursday, 7.5 mg on Thursdays.  Assessment: Patient admitted on 4/5 with significant bleeding from mouth s/p extraction of 4 teeth. Patient was hypotensive on admission with a Hgb of 5.9 and INR of 4.9. She received vitamin K and KCentra in ED on 4/5. Appears patient recently was taking azithromycin per med rec.  Patient was taking warfarin prior to admission for a mechanical mitral valve as well as atrial fibrillation. Once bleeding stopped, patient was restarted on warfarin with a heparin drip bridge. INR subtherapeutic post-KCentra and vitamin K. Post KCentra INR ordered per protocol was discontinued by MD.  Dosing history: Date INR Dose Comments 4/5 4.9 -- KCentra and vitamin K 4/6 1.19  Goal of Therapy:  INR 2.5-3.5 Heparin level 0.3-0.7. Monitor platelets by anticoagulation protocol: Yes   Plan:  HL = 0.40 (therapeutic). Will continue heparin drip at current rate of 1300 units/hr and order confirmatory HL in 6 hours. CBC ordered daily with AM labs.  Will order warfarin 5 mg PO dose this evening - essentially new start  after reversal so expect it will take several days to see increase in INR.  Lenis Noon, PharmD, BCPS Clinical Pharmacist 03/28/2017,11:26 AM  Addendum:  Damaris Schooner with RN - confirmed no signs/symptoms of bleeding at this time.   Lenis Noon, PharmD 03/28/17 11:39 AM

## 2017-03-28 NOTE — Care Management (Signed)
On chronic coumadin.  Had tooth extraction 4.5 and hemorrhaged later in the evening.  Presented with hemoglobin of 5.  Patient currently on 02 which is acute. She is very lethargic and drifts off to sleep during conversation.  PT consult is pending.  She presents from home and says she does not have any issues accessing medical care or obtaining medicaitons

## 2017-03-28 NOTE — Care Management Important Message (Signed)
Important Message  Patient Details  Name: AMILLYA CHAVIRA MRN: [id number] Date of Birth: July 27, 1946   Medicare Important Message Given:  Yes  Provided patient with notice and explained. She verbalizes understanding of her right to disagree with physician regarding discharge.  She is on contact isolation so not able to take a signed copy out of her room to scan into medical record     Katrina Stack, RN 03/28/2017, 12:16 PM

## 2017-03-28 NOTE — Progress Notes (Signed)
MD notified via text, pt has 8 beats of VT. I will await any new orders. I will continue to assess.

## 2017-03-28 NOTE — Progress Notes (Signed)
ANTICOAGULATION CONSULT NOTE - Initial Consult  Pharmacy Consult for warfarin dosing with heparin drip bridge Indication: mechanical valve/atrial fibrillation  Allergies  Allergen Reactions  . Amiodarone Other (See Comments)    Pt states that this medication causes lung bleeding.      Patient Measurements: Height: 5\' 9"  (175.3 cm) Weight: 229 lb 6.4 oz (104.1 kg) IBW/kg (Calculated) : 66.2 Heparin Dosing Weight: 90kg  Vital Signs: Temp: 98.5 F (36.9 C) (04/05 2301) Temp Source: Oral (04/05 2301) BP: 111/39 (04/05 2301) Pulse Rate: 96 (04/05 2301)  Labs:  Recent Labs  03/27/17 1857 03/27/17 1924 03/27/17 2137 03/27/17 2258  HGB 5.9*  --   --  9.1*  HCT 19.8*  --   --   --   PLT 367  --   --   --   APTT  --   --   --  29  LABPROT 47.1* 37.2* 16.4* 15.4*  INR 4.90* 3.65 1.31 1.21  CREATININE 1.56*  --   --   --     Estimated Creatinine Clearance: 43.1 mL/min (A) (by C-G formula based on SCr of 1.56 mg/dL (H)).   Medical History: Past Medical History:  Diagnosis Date  . Acute diastolic heart failure ([name])   . Allergy   . 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 Asthma   . Atrial fibrillation ([place])   . Backache, unspecified   . Cardiomegaly   . COPD (chronic obstructive pulmonary disease) ([place])   . Diabetes mellitus without complication ([place])   . 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 Esophageal reflux   . Headache(784.0)   . Herpes zoster without mention of complication   . Hx: UTI (urinary tract infection)   . Hypertension    heart controlled w CHF  . Nontoxic uninodular goiter   . Obesity, unspecified   . Osteoarthrosis, unspecified whether generalized or localized, unspecified site   . Unspecified sleep apnea   . Urine incontinence    hx of    Medications:  Patient on warfarin at home 5 mg every day except Thursday, 7.5 mg on Thursdays.  Assessment: Patient admitted with oral hemorrhage, INR 4.9. Given vitamin K and  KCentra INR subtherapeutic after vitamin K/KCentra.   Goal of Therapy:  INR 2.5-3.5 Heparin level 0.3-0.7. Monitor platelets by anticoagulation protocol: Yes   Plan:  Heparin 5000 unit bolus and initial rate of 1300 units/hr. First heparin level 6 hours after start of infusion.  Will resume warfarin at 5 mg daily. INR daily. Previous post KCentra monitoring INRs cancelled by MD.  Waneta Martins S 03/28/2017,1:01 AM

## 2017-03-28 NOTE — Evaluation (Signed)
Physical Therapy Evaluation Patient Details Name: Betty [name] MRN: [id number] DOB: 1946/05/30 Today's Date: 03/28/2017   History of Present Illness   71 y.o. female who presents with Oral hemorrhage after dental extraction. Patient had 4 teeth extracted a dentist office earlier today, and had significant hemorrhage afterwards. Here in the ED she was initially hypotensive and had hemoglobin of around 5. Patient is on warfarin for chronic anticoagulation for mechanical mitral valve and atrial fibrillation.  Her INR initially in the ED was supratherapeutic at 4.9.  Pt recieved 2 units of blood.  Clinical Impression  Pt is able to ambulate around center loop w/o AD and with only minimal c/o fatigue.  She is not at her baseline and is interested in having HHPT, which would be an appropriate idea for her.  She showed good effort and despite feeling somewhat sleepy was able to fully participate.     Follow Up Recommendations Home health PT    Equipment Recommendations       Recommendations for Other Services       Precautions / Restrictions Precautions Precautions: Fall Restrictions Weight Bearing Restrictions: No      Mobility  Bed Mobility Overal bed mobility: Independent             General bed mobility comments: Pt able to get up to sitting EOB w/o assist  Transfers Overall transfer level: Independent Equipment used: None             General transfer comment: Pt was able to stand up w/o assist  Ambulation/Gait Ambulation/Gait assistance: Supervision Ambulation Distance (Feet): 125 Feet Assistive device: Rolling walker (2 wheeled)       General Gait Details: Pt was able to ambulate with slow but consistent gait.  She did have slight L limp but did not have any LOBs or significant limitations.  She had some fatigue, but overall was safe and able to ambulate appropriate in-home distances.   Stairs            Wheelchair Mobility    Modified Rankin (Stroke  Patients Only)       Balance Overall balance assessment: Modified Independent                                           Pertinent Vitals/Pain Pain Assessment: 0-10 Pain Score: 5  Pain Location: area of dental work     Home Living Family/patient expects to be discharged to:: Private residence Living Arrangements: Alone Available Help at Discharge: Family;Friend(s) Type of Home: House Home Access: Stairs to enter Entrance Stairs-Rails: Right Entrance Stairs-Number of Steps: 2 Home Layout: One level        Prior Function Level of Independence: Independent         Comments: Pt reports that she can go out and run errands, etc w/o issue     Hand Dominance        Extremity/Trunk Assessment   Upper Extremity Assessment Upper Extremity Assessment: Generalized weakness;Overall WFL for tasks assessed    Lower Extremity Assessment Lower Extremity Assessment: Overall WFL for tasks assessed;Generalized weakness       Communication   Communication: No difficulties  Cognition Arousal/Alertness: Awake/alert Behavior During Therapy: WFL for tasks assessed/performed Overall Cognitive Status: Within Functional Limits for tasks assessed  General Comments      Exercises     Assessment/Plan    PT Assessment Patient needs continued PT services  PT Problem List Decreased strength;Decreased range of motion;Decreased activity tolerance;Decreased balance;Decreased mobility;Decreased safety awareness;Cardiopulmonary status limiting activity;Pain       PT Treatment Interventions Gait training;Functional mobility training;Therapeutic activities;Therapeutic exercise;Balance training;Neuromuscular re-education;Patient/family education    PT Goals (Current goals can be found in the Care Plan section)  Acute Rehab PT Goals Patient Stated Goal: Pt wanting to go home PT Goal Formulation: With patient Time  For Goal Achievement: 04/11/17 Potential to Achieve Goals: Good    Frequency Min 2X/week   Barriers to discharge        Co-evaluation               End of Session Equipment Utilized During Treatment: Gait belt Activity Tolerance: Patient limited by fatigue;Patient tolerated treatment well Patient left: with bed alarm set;with call bell/phone within reach   PT Visit Diagnosis: Muscle weakness (generalized) (M62.81);Difficulty in walking, not elsewhere classified (R26.2)    Time: 6759-1638 PT Time Calculation (min) (ACUTE ONLY): 26 min   Charges:   PT Evaluation $PT Eval Low Complexity: 1 Procedure     PT G Codes:        Kreg Shropshire, DPT 03/28/2017, 4:24 PM

## 2017-03-29 LAB — BASIC METABOLIC PANEL
ANION GAP: 6 (ref 5–15)
BUN: 52 mg/dL — AB (ref 6–20)
CO2: 33 mmol/L — AB (ref 22–32)
Calcium: 7.8 mg/dL — ABNORMAL LOW (ref 8.9–10.3)
Chloride: 95 mmol/L — ABNORMAL LOW (ref 101–111)
Creatinine, Ser: 0.83 mg/dL (ref 0.44–1.00)
GFR calc Af Amer: >60 mL/min (ref >60)
GLUCOSE: 141 mg/dL — AB (ref 65–99)
POTASSIUM: 4.2 mmol/L (ref 3.5–5.1)
Sodium: 134 mmol/L — ABNORMAL LOW (ref 135–145)

## 2017-03-29 LAB — GLUCOSE, CAPILLARY
GLUCOSE-CAPILLARY: 257 mg/dL — AB (ref 65–99)
GLUCOSE-CAPILLARY: 136 mg/dL — AB (ref 65–99)
Glucose-Capillary: 175 mg/dL — ABNORMAL HIGH (ref 65–99)
Glucose-Capillary: 137 mg/dL — ABNORMAL HIGH (ref 65–99)
Glucose-Capillary: 204 mg/dL — ABNORMAL HIGH (ref 65–99)

## 2017-03-29 LAB — CBC
HCT: 20.8 % — ABNORMAL LOW (ref 35.0–47.0)
HEMOGLOBIN: 6.9 g/dL — AB (ref 12.0–16.0)
MCH: 27.3 pg (ref 26.0–34.0)
MCHC: 33.1 g/dL (ref 32.0–36.0)
MCV: 82.5 fL (ref 80.0–100.0)
PLATELETS: 249 10*3/uL (ref 150–440)
RBC: 2.52 MIL/uL — ABNORMAL LOW (ref 3.80–5.20)
RDW: 19.5 % — ABNORMAL HIGH (ref 11.5–14.5)
WBC: 17.2 10*3/uL — ABNORMAL HIGH (ref 3.6–11.0)

## 2017-03-29 LAB — PROTIME-INR
INR: 1.07
PROTHROMBIN TIME: 13.9 s (ref 11.4–15.2)

## 2017-03-29 LAB — HEPARIN LEVEL (UNFRACTIONATED): HEPARIN UNFRACTIONATED: 0.4 [IU]/mL (ref 0.30–0.70)

## 2017-03-29 LAB — PREPARE RBC (CROSSMATCH)

## 2017-03-29 MED ORDER — WARFARIN SODIUM 5 MG PO TABS
7.5000 mg | ORAL_TABLET | Freq: Once | ORAL | Status: AC
  Administered 2017-03-29: 7.5 mg via ORAL
  Filled 2017-03-29: qty 2

## 2017-03-29 MED ORDER — METOPROLOL TARTRATE 25 MG PO TABS
12.5000 mg | ORAL_TABLET | Freq: Two times a day (BID) | ORAL | Status: DC
  Administered 2017-03-29 – 2017-04-09 (×19): 12.5 mg via ORAL
  Filled 2017-03-29 (×20): qty 1

## 2017-03-29 MED ORDER — WARFARIN - PHARMACIST DOSING INPATIENT
Freq: Every day | Status: DC
  Administered 2017-04-05 – 2017-04-08 (×4)

## 2017-03-29 MED ORDER — SODIUM CHLORIDE 0.9 % IV SOLN
Freq: Once | INTRAVENOUS | Status: AC
  Administered 2017-03-29: 12:00:00 via INTRAVENOUS

## 2017-03-29 MED ORDER — WARFARIN SODIUM 5 MG PO TABS: 7.5000 mg | ORAL_TABLET | Freq: Every day | ORAL | Status: DC

## 2017-03-29 MED ORDER — ROPINIROLE HCL 1 MG PO TABS
2.0000 mg | ORAL_TABLET | Freq: Every evening | ORAL | Status: DC | PRN
  Administered 2017-03-29 – 2017-04-08 (×8): 2 mg via ORAL
  Filled 2017-03-29 (×9): qty 2

## 2017-03-29 NOTE — Progress Notes (Signed)
MD notified via text. Pts SBP<90. MD will place orders to adjust metoprolol. I will continue to assess.

## 2017-03-29 NOTE — Progress Notes (Addendum)
ANTICOAGULATION CONSULT NOTE - Initial Consult  Pharmacy Consult for warfarin dosing with heparin drip bridge Indication: mechanical mitral valve/atrial fibrillation  Allergies  Allergen Reactions  . Amiodarone Other (See Comments)    Pt states that this medication causes lung bleeding.      Patient Measurements: Height: 5\' 9"  (175.3 cm) Weight: 228 lb 4.8 oz (103.6 kg) IBW/kg (Calculated) : 66.2 Heparin Dosing Weight: 90kg  Vital Signs: Temp: 99.1 F (37.3 C) (04/06 1939) Temp Source: Oral (04/06 1939) BP: 102/89 (04/06 1939) Pulse Rate: 83 (04/06 1939)  Labs:  Recent Labs  03/27/17 1857  03/27/17 2258 03/28/17 0438 03/28/17 0820 03/28/17 1422 03/29/17 0421  HGB 5.9*  --  9.1* 8.6* 8.2*  --  6.9*  HCT 19.8*  --   --  26.3*  --   --  20.8*  PLT 367  --   --  276  --   --  249  APTT  --   --  29  --   --   --   --   LABPROT 47.1*  < > 15.4* 15.2  --   --  13.9  INR 4.90*  < > 1.21 1.19  --   --  1.07  HEPARINUNFRC  --   --   --   --  0.40 0.46 0.40  CREATININE 1.56*  --   --  0.99  --   --  0.83  < > = values in this interval not displayed.  Estimated Creatinine Clearance: 80.8 mL/min (by C-G formula based on SCr of 0.83 mg/dL).   Medical History: Past Medical History:  Diagnosis Date  . Acute diastolic heart failure ([place])   . Allergy   . 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 Asthma   . Atrial fibrillation ([place])   . Backache, unspecified   . Cardiomegaly   . COPD (chronic obstructive pulmonary disease) ([place])   . Diabetes mellitus without complication ([place])   . 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 Esophageal reflux   . Headache(784.0)   . Herpes zoster without mention of complication   . Hx: UTI (urinary tract infection)   . Hypertension    heart controlled w CHF  . Nontoxic uninodular goiter   . Obesity, unspecified   . Osteoarthrosis, unspecified whether generalized or localized, unspecified site   . Unspecified sleep  apnea   . Urine incontinence    hx of    Medications:  Patient on warfarin at home 5 mg every day except Thursday, 7.5 mg on Thursdays.  Assessment: Patient admitted on 4/5 with significant bleeding from mouth s/p extraction of 4 teeth. Patient was hypotensive on admission with a Hgb of 5.9 and INR of 4.9. She received vitamin K and KCentra in ED on 4/5. Appears patient recently was taking azithromycin per med rec.  Patient was taking warfarin prior to admission for a mechanical mitral valve as well as atrial fibrillation. Once bleeding stopped, patient was restarted on warfarin with a heparin drip bridge. INR subtherapeutic post-KCentra and vitamin K. Post KCentra INR ordered per protocol was discontinued by MD.  Dosing history: Date INR Dose Comments 4/5 4.9 -- KCentra and vitamin K 4/6 1.19 4/7  1.07  Goal of Therapy:  INR 2.5-3.5 Heparin level 0.3-0.7. Monitor platelets by anticoagulation protocol: Yes   Plan:  HL = 0.40 (therapeutic). Will continue heparin drip at current rate of 1300 units/hr and order confirmatory HL in 6 hours. CBC ordered daily  with AM labs.  4/7 AM heparin level 0.40. Continue current regimen. Recheck CBC and heparin level with tomorrow AM labs.  Will order warfarin 5 mg PO dose this evening - essentially new start after reversal so expect it will take several days to see increase in INR.  McBane,Matthew S, PharmD, BCPS Clinical Pharmacist 03/29/2017,5:52 AM   4/7 1300 spoke with Dr. Earleen Newport and would like to give patient higher one time dose of warfarin since INR is trending down. Ordered warfarin 7.5 mg @ 1800 x 1 dose  Darrow Bussing, PharmD Pharmacy Resident 03/29/2017 12:59 PM

## 2017-03-29 NOTE — Progress Notes (Signed)
Pt received 1 unit of Prbc's., pt tolerated transfusion. BP soft, MD aware, I will continue to assess.

## 2017-03-29 NOTE — Progress Notes (Signed)
[facility] Cardiology [facility] Encounter Note  Patient: Betty [name] / Admit Date: 03/27/2017 / Date of Encounter: 03/29/2017, 6:30 AM   Subjective: No apparent bleeding. Patient is stabilized with a hemoglobin with the current hemoglobin of 6.9. No evidence of significant side effects of this anemia other than possible foot cramping. The patient has been on heparin for further risk reduction in stroke or thrombosis of the mitral valve and currently stable with exam. Patient will need further anticoagulation before discharge  Review of Systems: Positive for: Mouth pain and nosebleed Negative for: Vision change, hearing change, syncope, dizziness, nausea, vomiting,diarrhea, bloody stool, stomach pain, cough, congestion, diaphoresis, urinary frequency, urinary pain,skin lesions, skin rashes Others previously listed  Objective: Telemetry: Normal sinus rhythm Physical Exam: Blood pressure (!) 120/43, pulse 88, temperature 98.5 F (36.9 C), temperature source Oral, resp. rate 16, height 5\' 9"  (1.753 m), weight 103.1 kg (227 lb 5.4 oz), SpO2 96 %. Body mass index is 33.57 kg/m. General: Well developed, well nourished, in no acute distress. Head: Normocephalic, atraumatic, sclera non-icteric, no xanthomas, nares are without discharge. Neck: No apparent masses Lungs: Normal respirations with no wheezes, no rhonchi, no rales , no crackles   Heart: Regular rate and rhythm, normal mechanical S1 S2, no murmur, no rub, no gallop, PMI is normal size and placement, carotid upstroke normal without bruit, jugular venous pressure normal Abdomen: Soft, non-tender, non-distended with normoactive bowel sounds. No hepatosplenomegaly. Abdominal aorta is normal size without bruit Extremities: No edema, no clubbing, no cyanosis, no ulcers,  Peripheral: 2+ radial, 2+ femoral, 2+ dorsal pedal pulses Neuro: Alert and oriented. Moves all extremities spontaneously. Psych:  Responds to questions appropriately with a  normal affect.   Intake/Output Summary (Last 24 hours) at 03/29/17 0630 Last data filed at 03/29/17 4782  Gross per 24 hour  Intake            219.7 ml  Output             1200 ml  Net           -980.3 ml    Inpatient Medications:  . atorvastatin  20 mg Oral Daily  . azaTHIOprine  100 mg Oral Daily  . gabapentin  600 mg Oral BID  . insulin aspart  0-5 Units Subcutaneous QHS  . insulin aspart  0-9 Units Subcutaneous TID WC  . metoprolol tartrate  25 mg Oral BID  . oxybutynin  5 mg Oral BID  . pantoprazole  40 mg Oral Daily  . rOPINIRole  4 mg Oral QHS  . sodium chloride flush  3 mL Intravenous Q12H  . traZODone  200 mg Oral QHS  . venlafaxine XR  225 mg Oral Daily  . warfarin  5 mg Oral q1800   Infusions:  . heparin 1,300 Units/hr (03/28/17 1715)    Labs:  Recent Labs  03/28/17 0438 03/28/17 0820 03/29/17 0421  NA 134*  --  134*  K 4.1  --  4.2  CL 93*  --  95*  CO2 32  --  33*  GLUCOSE 173*  --  141*  BUN 75*  --  52*  CREATININE 0.99  --  0.83  CALCIUM 8.2*  --  7.8*  MG  --  1.6*  --    No results for input(s): AST, ALT, ALKPHOS, BILITOT, PROT, ALBUMIN in the last 72 hours.  Recent Labs  03/27/17 1857  03/28/17 0438 03/28/17 0820 03/29/17 0421  WBC 20.2*  --  23.8*  --  17.2*  NEUTROABS 17.9*  --   --   --   --   HGB 5.9*  < > 8.6* 8.2* 6.9*  HCT 19.8*  --  26.3*  --  20.8*  MCV 80.6  --  79.7*  --  82.5  PLT 367  --  276  --  249  < > = values in this interval not displayed. No results for input(s): CKTOTAL, CKMB, TROPONINI in the last 72 hours. Invalid input(s): POCBNP No results for input(s): HGBA1C in the last 72 hours.   Weights: Filed Weights   03/27/17 2301 03/28/17 0523 03/29/17 0612  Weight: 104.1 kg (229 lb 6.4 oz) 103.6 kg (228 lb 4.8 oz) 103.1 kg (227 lb 5.4 oz)     Radiology/Studies:  No results found.   Assessment and Recommendation  71 y.o. female with known diabetes with complication essential hypertension makes  hyperlipidemia with the mitral valve stenosis status post mitral valve replacement with mechanical valve and paroxysmal nonvalvular atrial fibrillation status post ablation currently remaining in normal sinus rhythm without evidence of heart failure and/or myocardial infarction with stabilized hemoglobin after postsurgical bleeding 1. Continue supportive care watching for any bleeding and/or further postsurgical bruising 2. Continue heparin until INR up to 2-2.5 to reduce the possibility of stroke with atrial fibrillation and/or thrombosis of mitral valve. This can be accomplished by Lovenox as an outpatient twice a day and/or heparin drip as an inpatient 3. No further cardiac diagnostics necessary at this time X 4. Call if further questions  Signed, Serafina Royals M.D. FACC

## 2017-03-29 NOTE — Progress Notes (Signed)
Patient ID: Betty [name], female   DOB: 1946/04/22, 71 y.o.   MRN: [id number]  [facility] PROGRESS NOTE  Betty [name] MHD:[id number] DOB: 01/19/1946 DOA: 03/27/2017 PCP: Dion Body, MD  HPI/Subjective: Patient still feeling weak and tired. No further bleeding from her teeth and gums. Still short of breath. Her restless legs weren't acting up last night.  Objective: Vitals:   03/29/17 1136 03/29/17 1200  BP: (!) 84/31 (!) 82/33  Pulse: 74 72  Resp:  18  Temp: 98.6 F (37 C) 98.5 F (36.9 C)    Filed Weights   03/27/17 2301 03/28/17 0523 03/29/17 0612  Weight: 104.1 kg (229 lb 6.4 oz) 103.6 kg (228 lb 4.8 oz) 103.1 kg (227 lb 5.4 oz)    ROS: Review of Systems  Constitutional: Negative for chills and fever.  Eyes: Negative for blurred vision.  Respiratory: Negative for cough and shortness of breath.   Cardiovascular: Negative for chest pain.  Gastrointestinal: Negative for abdominal pain, constipation, diarrhea, nausea and vomiting.  Genitourinary: Negative for dysuria.  Musculoskeletal: Negative for joint pain.  Neurological: Positive for weakness. Negative for dizziness and headaches.   Exam: Physical Exam  Constitutional: She is oriented to person, place, and time.  HENT:  Nose: No mucosal edema.  Mouth/Throat: No oropharyngeal exudate or posterior oropharyngeal edema.  Eyes: Conjunctivae, EOM and lids are normal. Pupils are equal, round, and reactive to light.  Neck: No JVD present. Carotid bruit is not present. No edema present. No thyroid mass and no thyromegaly present.  Cardiovascular: S1 normal and S2 normal.  Exam reveals no gallop.   No murmur heard. Pulses:      Dorsalis pedis pulses are 2+ on the right side, and 2+ on the left side.  Respiratory: No respiratory distress. She has no wheezes. She has no rhonchi. She has no rales.  GI: Soft. Bowel sounds are normal. There is no tenderness.  Musculoskeletal:       Right ankle: She exhibits no  swelling.       Left ankle: She exhibits no swelling.  Lymphadenopathy:    She has no cervical adenopathy.  Neurological: She is alert and oriented to person, place, and time. No cranial nerve deficit.  Skin: Skin is warm. No rash noted. Nails show no clubbing.  Bruising bilateral lower face and neck  Psychiatric: She has a normal mood and affect.      Data Reviewed: Basic Metabolic Panel:  Recent Labs Lab 03/27/17 1857 03/28/17 0438 03/28/17 0820 03/29/17 0421  NA 131* 134*  --  134*  K 5.3* 4.1  --  4.2  CL 90* 93*  --  95*  CO2 23 32  --  33*  GLUCOSE 217* 173*  --  141*  BUN 91* 75*  --  52*  CREATININE 1.56* 0.99  --  0.83  CALCIUM 8.3* 8.2*  --  7.8*  MG  --   --  1.6*  --    CBC:  Recent Labs Lab 03/27/17 1857 03/27/17 2258 03/28/17 0438 03/28/17 0820 03/29/17 0421  WBC 20.2*  --  23.8*  --  17.2*  NEUTROABS 17.9*  --   --   --   --   HGB 5.9* 9.1* 8.6* 8.2* 6.9*  HCT 19.8*  --  26.3*  --  20.8*  MCV 80.6  --  79.7*  --  82.5  PLT 367  --  276  --  249    BNP (last 3 results)  Recent  Labs  04/01/16 1244 04/02/16 0413 07/26/16 1159  BNP 126.0* 237.0* 504.0*    CBG:  Recent Labs Lab 03/28/17 1140 03/28/17 1639 03/28/17 2049 03/29/17 0822 03/29/17 1153  GLUCAP 205* 175* 183* 137* 257*     Scheduled Meds: . atorvastatin  20 mg Oral Daily  . azaTHIOprine  100 mg Oral Daily  . gabapentin  600 mg Oral BID  . insulin aspart  0-5 Units Subcutaneous QHS  . insulin aspart  0-9 Units Subcutaneous TID WC  . metoprolol tartrate  12.5 mg Oral BID  . oxybutynin  5 mg Oral BID  . pantoprazole  40 mg Oral Daily  . rOPINIRole  4 mg Oral QHS  . sodium chloride flush  3 mL Intravenous Q12H  . traZODone  200 mg Oral QHS  . venlafaxine XR  225 mg Oral Daily  . warfarin  5 mg Oral q1800   Continuous Infusions: . heparin 1,300 Units/hr (03/29/17 1224)    Assessment/Plan:  1. Acute blood loss anemia. Hemorrhage after oral surgery procedure to  remove 4 teeth. The patient was transfused 2 units of packed red blood cells. Since hemoglobin dropped down to 6.9 today I will transfuse another unit of blood today. Recheck hemoglobin tomorrow 2. Metallic valve. Patient's Coumadin was reversed with 10 mg of IV vitamin K and Kcentra. It will likely take a while to get the patient's Coumadin level up again. On IV heparin and pharmacy dosing Coumadin. Daily INR checks. 3. Atrial fibrillation with rapid ventricular response. With blood pressure being on the lower side I will have to decrease the dose the patient's metoprolol to 12.5 mg twice a day. Heart rate better controlled at this point. 4. Hypomagnesemia. IV magnesium given yesterday 5. Hyperlipidemia unspecified on atorvastatin 6. 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 Type 2 diabetes mellitus on sliding scale 8. Urinary incontinence on oxybutynin 9. Restless leg syndrome on Requip. Extra dose of Requip ordered if needed 10. Home health physical therapy recommended by PT  Code Status:     Code Status Orders        Start     Ordered   03/27/17 2252  Full code  Continuous     03/27/17 2251    Code Status History    Date Active Date Inactive Code Status Order ID Comments User Context   06/26/2016  1:06 AM 06/28/2016  4:39 PM Full Code [id number]  Lance Coon, MD Inpatient   05/10/2016  4:38 PM 05/12/2016  4:59 PM Full Code [id number]  Theodoro Grist, MD ED   04/01/2016  5:13 PM 04/03/2016  5:58 AM Full Code [id number]  Henreitta Leber, MD Inpatient     Disposition Plan: Likely will be with [place] for a while since they reverse the Coumadin with 10 mg of IV vitamin K. Need to have Coumadin level therapeutic prior to going home  Time spent: 25 minutes  [place], [place]

## 2017-03-30 LAB — CBC
HCT: 22.6 % — ABNORMAL LOW (ref 35.0–47.0)
Hemoglobin: 7.5 g/dL — ABNORMAL LOW (ref 12.0–16.0)
MCH: 27.7 pg (ref 26.0–34.0)
MCHC: 33.3 g/dL (ref 32.0–36.0)
MCV: 83.1 fL (ref 80.0–100.0)
PLATELETS: 211 10*3/uL (ref 150–440)
RBC: 2.71 MIL/uL — AB (ref 3.80–5.20)
RDW: 19 % — ABNORMAL HIGH (ref 11.5–14.5)
WBC: 10.6 10*3/uL (ref 3.6–11.0)

## 2017-03-30 LAB — TYPE AND SCREEN
ABO/RH(D): A POS
Antibody Screen: NEGATIVE
Unit division: 0
Unit division: 0
Unit division: 0

## 2017-03-30 LAB — URINALYSIS, COMPLETE (UACMP) WITH MICROSCOPIC
BILIRUBIN URINE: NEGATIVE
GLUCOSE, UA: NEGATIVE mg/dL
Hgb urine dipstick: NEGATIVE
KETONES UR: NEGATIVE mg/dL
Nitrite: NEGATIVE
PH: 7 (ref 5.0–8.0)
Protein, ur: NEGATIVE mg/dL
Specific Gravity, Urine: 1.012 (ref 1.005–1.030)

## 2017-03-30 LAB — HEPARIN LEVEL (UNFRACTIONATED)
Heparin Unfractionated: 0.28 IU/mL — ABNORMAL LOW (ref 0.30–0.70)
Heparin Unfractionated: 0.54 IU/mL (ref 0.30–0.70)
Heparin Unfractionated: 0.33 IU/mL (ref 0.30–0.70)

## 2017-03-30 LAB — GLUCOSE, CAPILLARY
GLUCOSE-CAPILLARY: 121 mg/dL — AB (ref 65–99)
Glucose-Capillary: 138 mg/dL — ABNORMAL HIGH (ref 65–99)
Glucose-Capillary: 192 mg/dL — ABNORMAL HIGH (ref 65–99)

## 2017-03-30 LAB — BPAM RBC
BLOOD PRODUCT EXPIRATION DATE: 201804202359
Blood Product Expiration Date: 201804202359
Blood Product Expiration Date: 201804272359
ISSUE DATE / TIME: 201804051852
ISSUE DATE / TIME: 201804071139
ISSUE DATE / TIME: 201804051852
UNIT TYPE AND RH: 5100
UNIT TYPE AND RH: 6200
Unit Type and Rh: 5100

## 2017-03-30 LAB — LACTATE DEHYDROGENASE: LDH: 257 U/L — ABNORMAL HIGH (ref 98–192)

## 2017-03-30 LAB — PROTIME-INR
INR: 1.03
Prothrombin Time: 13.5 seconds (ref 11.4–15.2)

## 2017-03-30 MED ORDER — WARFARIN SODIUM 5 MG PO TABS
5.0000 mg | ORAL_TABLET | Freq: Every day | ORAL | Status: DC
  Administered 2017-03-30: 5 mg via ORAL
  Filled 2017-03-30: qty 1

## 2017-03-30 MED ORDER — HEPARIN BOLUS VIA INFUSION
1400.0000 [IU] | Freq: Once | INTRAVENOUS | Status: AC
  Administered 2017-03-30: 1400 [IU] via INTRAVENOUS
  Filled 2017-03-30: qty 1400

## 2017-03-30 NOTE — Progress Notes (Signed)
Notified MD Marcille Blanco of hemoglobin 7.5; no new orders at this time. Nursing staff will continue to monitor for any changes in patient status. Earleen Reaper, RN

## 2017-03-30 NOTE — Progress Notes (Signed)
MD notified via text to review U/A results. I will continue to assess.

## 2017-03-30 NOTE — Progress Notes (Addendum)
ANTICOAGULATION CONSULT NOTE - Initial Consult  Pharmacy Consult for warfarin dosing with heparin drip bridge Indication: mechanical mitral valve/atrial fibrillation  Allergies  Allergen Reactions  . Amiodarone Other (See Comments)    Pt states that this medication causes lung bleeding.      Patient Measurements: Height: 5\' 9"  (175.3 cm) Weight: 227 lb 3.2 oz (103.1 kg) IBW/kg (Calculated) : 66.2 Heparin Dosing Weight: 90kg  Vital Signs: Temp: 99.3 F (37.4 C) (04/08 2026) Temp Source: Oral (04/08 2026) BP: 131/54 (04/08 2026) Pulse Rate: 91 (04/08 2026)  Labs:  Recent Labs  03/27/17 2258 03/28/17 0438  03/29/17 0421 03/30/17 0548 03/30/17 1251 03/30/17 2043  HGB 9.1* 8.6*  < > 6.9* 7.5*  --   --   HCT  --  26.3*  --  20.8* 22.6*  --   --   PLT  --  276  --  249 211  --   --   APTT 29  --   --   --   --   --   --   LABPROT 15.4* 15.2  --  13.9 13.5  --   --   INR 1.21 1.19  --  1.07 1.03  --   --   HEPARINUNFRC  --   --   < > 0.40 0.28* 0.54 0.33  CREATININE  --  0.99  --  0.83  --   --   --   < > = values in this interval not displayed.  Estimated Creatinine Clearance: 80.6 mL/min (by C-G formula based on SCr of 0.83 mg/dL).   Medical History: Past Medical History:  Diagnosis Date  . Acute diastolic heart failure ([place])   . Allergy   . 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 Asthma   . Atrial fibrillation ([place])   . Backache, unspecified   . Cardiomegaly   . COPD (chronic obstructive pulmonary disease) ([place])   . Diabetes mellitus without complication ([place])   . 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 Esophageal reflux   . Headache(784.0)   . Herpes zoster without mention of complication   . Hx: UTI (urinary tract infection)   . Hypertension    heart controlled w CHF  . Nontoxic uninodular goiter   . Obesity, unspecified   . Osteoarthrosis, unspecified whether generalized or localized, unspecified site   . Unspecified sleep  apnea   . Urine incontinence    hx of    Medications:  Patient on warfarin at home 5 mg every day except Thursday, 7.5 mg on Thursdays.  Assessment: Patient admitted on 4/5 with significant bleeding from mouth s/p extraction of 4 teeth. Patient was hypotensive on admission with a Hgb of 5.9 and INR of 4.9. She received vitamin K and KCentra in ED on 4/5. Appears patient recently was taking azithromycin per med rec.  Patient was taking warfarin prior to admission for a mechanical mitral valve as well as atrial fibrillation. Once bleeding stopped, patient was restarted on warfarin with a heparin drip bridge. INR subtherapeutic post-KCentra and vitamin K. Post KCentra INR ordered per protocol was discontinued by MD.  Dosing history: Date INR Dose Comments 4/5 4.9 -- KCentra and vitamin K 4/6 1.19; 5 mg 4/7  1.07; 7.5 mg ([place])  4/8        1.03; 5 mg   Goal of Therapy:  INR 2.5-3.5 Heparin level 0.3-0.7. Monitor platelets by anticoagulation protocol: Yes   Plan:  HL = 0.40 (therapeutic).  Will continue heparin drip at current rate of 1300 units/hr and order confirmatory HL in 6 hours. CBC ordered daily with AM labs.  4/7 AM heparin level 0.40. Continue current regimen. Recheck CBC and heparin level with tomorrow AM labs.  Will order warfarin 5 mg PO dose this evening - essentially new start after reversal so expect it will take several days to see increase in INR.  Robbins,Jason D, PharmD, BCPS Clinical Pharmacist 03/30/2017,10:04 PM   4/7 1300 spoke with Dr. Earleen Newport and would like to give patient higher one time dose of warfarin since INR is trending down. Ordered warfarin 7.5 mg @ 1800 x 1 dose  4/8 AM heparin level 0.28. 1400 unit bolus and increase rate to 1500 units/hr. Recheck in 6 hours. INR 1.03. Daily warfarin dose of 5 mg reordered.  4/8: Heparin level resulted @ 0.54. Will recheck level @2100 .   4/8: HL @ 21:00 = 0.33 Will continue this pt on current rate and  recheck HL on 4/9 @ 0500.   4/9 AM heparin level 0.51. Continue current regimen. Recheck CBC and heparin level tomorrow AM. INR 1.06. Will give 7.5 mg warfarin x1 today and resume 5 mg daily tomorrow.   Sim Boast, PharmD, BCPS  03/31/17 5:34 AM

## 2017-03-30 NOTE — Progress Notes (Addendum)
ANTICOAGULATION CONSULT NOTE - Initial Consult  Pharmacy Consult for warfarin dosing with heparin drip bridge Indication: mechanical mitral valve/atrial fibrillation  Allergies  Allergen Reactions  . Amiodarone Other (See Comments)    Pt states that this medication causes lung bleeding.      Patient Measurements: Height: 5\' 9"  (175.3 cm) Weight: 227 lb 3.2 oz (103.1 kg) IBW/kg (Calculated) : 66.2 Heparin Dosing Weight: 90kg  Vital Signs: Temp: 98.7 F (37.1 C) (04/08 1416) Temp Source: Oral (04/08 1416) BP: 108/50 (04/08 1416) Pulse Rate: 78 (04/08 1416)  Labs:  Recent Labs  03/27/17 1857  03/27/17 2258 03/28/17 0438  03/29/17 0421 03/30/17 0548 03/30/17 1251  HGB 5.9*  --  9.1* 8.6*  < > 6.9* 7.5*  --   HCT 19.8*  --   --  26.3*  --  20.8* 22.6*  --   PLT 367  --   --  276  --  249 211  --   APTT  --   --  29  --   --   --   --   --   LABPROT 47.1*  < > 15.4* 15.2  --  13.9 13.5  --   INR 4.90*  < > 1.21 1.19  --  1.07 1.03  --   HEPARINUNFRC  --   --   --   --   < > 0.40 0.28* 0.54  CREATININE 1.56*  --   --  0.99  --  0.83  --   --   < > = values in this interval not displayed.  Estimated Creatinine Clearance: 80.6 mL/min (by C-G formula based on SCr of 0.83 mg/dL).   Medical History: Past Medical History:  Diagnosis Date  . Acute diastolic heart failure ([place])   . Allergy   . 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 Asthma   . Atrial fibrillation ([place])   . Backache, unspecified   . Cardiomegaly   . COPD (chronic obstructive pulmonary disease) ([place])   . Diabetes mellitus without complication ([place])   . 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 Esophageal reflux   . Headache(784.0)   . Herpes zoster without mention of complication   . Hx: UTI (urinary tract infection)   . Hypertension    heart controlled w CHF  . Nontoxic uninodular goiter   . Obesity, unspecified   . Osteoarthrosis, unspecified whether generalized or  localized, unspecified site   . Unspecified sleep apnea   . Urine incontinence    hx of    Medications:  Patient on warfarin at home 5 mg every day except Thursday, 7.5 mg on Thursdays.  Assessment: Patient admitted on 4/5 with significant bleeding from mouth s/p extraction of 4 teeth. Patient was hypotensive on admission with a Hgb of 5.9 and INR of 4.9. She received vitamin K and KCentra in ED on 4/5. Appears patient recently was taking azithromycin per med rec.  Patient was taking warfarin prior to admission for a mechanical mitral valve as well as atrial fibrillation. Once bleeding stopped, patient was restarted on warfarin with a heparin drip bridge. INR subtherapeutic post-KCentra and vitamin K. Post KCentra INR ordered per protocol was discontinued by MD.  Dosing history: Date INR Dose Comments 4/5 4.9 -- KCentra and vitamin K 4/6 1.19; 5 mg 4/7  1.07; 7.5 mg ([place])  4/8        1.03; 5 mg   Goal of Therapy:  INR 2.5-3.5 Heparin level  0.3-0.7. Monitor platelets by anticoagulation protocol: Yes   Plan:  HL = 0.40 (therapeutic). Will continue heparin drip at current rate of 1300 units/hr and order confirmatory HL in 6 hours. CBC ordered daily with AM labs.  4/7 AM heparin level 0.40. Continue current regimen. Recheck CBC and heparin level with tomorrow AM labs.  Will order warfarin 5 mg PO dose this evening - essentially new start after reversal so expect it will take several days to see increase in INR.  [name], PharmD, BCPS Clinical Pharmacist 03/30/2017,3:06 PM   4/7 1300 spoke with Dr. Earleen Newport and would like to give patient higher one time dose of warfarin since INR is trending down. Ordered warfarin 7.5 mg @ 1800 x 1 dose  4/8 AM heparin level 0.28. 1400 unit bolus and increase rate to 1500 units/hr. Recheck in 6 hours. INR 1.03. Daily warfarin dose of 5 mg reordered.  4/8: Heparin level resulted @ 0.54. Will recheck level @2100 .   [name],  PharmD

## 2017-03-30 NOTE — Progress Notes (Addendum)
ANTICOAGULATION CONSULT NOTE - Initial Consult  Pharmacy Consult for warfarin dosing with heparin drip bridge Indication: mechanical mitral valve/atrial fibrillation  Allergies  Allergen Reactions  . Amiodarone Other (See Comments)    Pt states that this medication causes lung bleeding.      Patient Measurements: Height: 5\' 9"  (175.3 cm) Weight: 227 lb 3.2 oz (103.1 kg) IBW/kg (Calculated) : 66.2 Heparin Dosing Weight: 90kg  Vital Signs: Temp: 98.6 F (37 C) (04/08 0500) Temp Source: Oral (04/07 1958) BP: 111/48 (04/08 0500) Pulse Rate: 78 (04/08 0500)  Labs:  Recent Labs  03/27/17 1857  03/27/17 2258 03/28/17 0438  03/28/17 1422 03/29/17 0421 03/30/17 0548  HGB 5.9*  --  9.1* 8.6*  < >  --  6.9* 7.5*  HCT 19.8*  --   --  26.3*  --   --  20.8* 22.6*  PLT 367  --   --  276  --   --  249 211  APTT  --   --  29  --   --   --   --   --   LABPROT 47.1*  < > 15.4* 15.2  --   --  13.9 13.5  INR 4.90*  < > 1.21 1.19  --   --  1.07 1.03  HEPARINUNFRC  --   --   --   --   < > 0.46 0.40 0.28*  CREATININE 1.56*  --   --  0.99  --   --  0.83  --   < > = values in this interval not displayed.  Estimated Creatinine Clearance: 80.6 mL/min (by C-G formula based on SCr of 0.83 mg/dL).   Medical History: Past Medical History:  Diagnosis Date  . Acute diastolic heart failure ([place])   . Allergy   . 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 Asthma   . Atrial fibrillation ([place])   . Backache, unspecified   . Cardiomegaly   . COPD (chronic obstructive pulmonary disease) ([place])   . Diabetes mellitus without complication ([place])   . 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 Esophageal reflux   . Headache(784.0)   . Herpes zoster without mention of complication   . Hx: UTI (urinary tract infection)   . Hypertension    heart controlled w CHF  . Nontoxic uninodular goiter   . Obesity, unspecified   . Osteoarthrosis, unspecified whether generalized or localized,  unspecified site   . Unspecified sleep apnea   . Urine incontinence    hx of    Medications:  Patient on warfarin at home 5 mg every day except Thursday, 7.5 mg on Thursdays.  Assessment: Patient admitted on 4/5 with significant bleeding from mouth s/p extraction of 4 teeth. Patient was hypotensive on admission with a Hgb of 5.9 and INR of 4.9. She received vitamin K and KCentra in ED on 4/5. Appears patient recently was taking azithromycin per med rec.  Patient was taking warfarin prior to admission for a mechanical mitral valve as well as atrial fibrillation. Once bleeding stopped, patient was restarted on warfarin with a heparin drip bridge. INR subtherapeutic post-KCentra and vitamin K. Post KCentra INR ordered per protocol was discontinued by MD.  Dosing history: Date INR Dose Comments 4/5 4.9 -- KCentra and vitamin K 4/6 1.19 4/7  1.07  Goal of Therapy:  INR 2.5-3.5 Heparin level 0.3-0.7. Monitor platelets by anticoagulation protocol: Yes   Plan:  HL = 0.40 (therapeutic). Will continue heparin  drip at current rate of 1300 units/hr and order confirmatory HL in 6 hours. CBC ordered daily with AM labs.  4/7 AM heparin level 0.40. Continue current regimen. Recheck CBC and heparin level with tomorrow AM labs.  Will order warfarin 5 mg PO dose this evening - essentially new start after reversal so expect it will take several days to see increase in INR.  Shenia Alan S, PharmD, BCPS Clinical Pharmacist 03/30/2017,6:21 AM   4/7 1300 spoke with Dr. Earleen Newport and would like to give patient higher one time dose of warfarin since INR is trending down. Ordered warfarin 7.5 mg @ 1800 x 1 dose  4/8 AM heparin level 0.28. 1400 unit bolus and increase rate to 1500 units/hr. Recheck in 6 hours. INR 1.03. Daily warfarin dose of 5 mg reordered.  Darrow Bussing, PharmD Pharmacy Resident 03/30/2017 6:21 AM

## 2017-03-30 NOTE — Progress Notes (Signed)
Patient ID: Betty [name], female   DOB: 11/27/1946, 71 y.o.   MRN: [id number]  [facility] PROGRESS NOTE  Betty [name] CWU:[id number] DOB: Jun 21, 1946 DOA: 03/27/2017 PCP: Dion Body, MD  HPI/Subjective: Patient feeling better. No signs of bleeding. Patient is very nervous about doing Lovenox injections and would rather have the IV heparin.  Objective: Vitals:   03/29/17 1958 03/30/17 0500  BP: (!) 102/42 (!) 111/48  Pulse: 78 78  Resp: 18 18  Temp: 98.7 F (37.1 C) 98.6 F (37 C)    Filed Weights   03/28/17 0523 03/29/17 0612 03/30/17 0500  Weight: 103.6 kg (228 lb 4.8 oz) 103.1 kg (227 lb 5.4 oz) 103.1 kg (227 lb 3.2 oz)    ROS: Review of Systems  Constitutional: Negative for chills and fever.  Eyes: Negative for blurred vision.  Respiratory: Negative for cough and shortness of breath.   Cardiovascular: Negative for chest pain.  Gastrointestinal: Negative for abdominal pain, constipation, diarrhea, nausea and vomiting.  Genitourinary: Negative for dysuria.  Musculoskeletal: Negative for joint pain.  Neurological: Positive for weakness. Negative for dizziness and headaches.   Exam: Physical Exam  Constitutional: She is oriented to person, place, and time.  HENT:  Nose: No mucosal edema.  Mouth/Throat: No oropharyngeal exudate or posterior oropharyngeal edema.  Eyes: Conjunctivae, EOM and lids are normal. Pupils are equal, round, and reactive to light.  Neck: No JVD present. Carotid bruit is not present. No edema present. No thyroid mass and no thyromegaly present.  Cardiovascular: S1 normal and S2 normal.  Exam reveals no gallop.   No murmur heard. Pulses:      Dorsalis pedis pulses are 2+ on the right side, and 2+ on the left side.  Respiratory: No respiratory distress. She has no wheezes. She has no rhonchi. She has no rales.  GI: Soft. Bowel sounds are normal. There is no tenderness.  Musculoskeletal:       Right ankle: She exhibits no swelling.        Left ankle: She exhibits no swelling.  Lymphadenopathy:    She has no cervical adenopathy.  Neurological: She is alert and oriented to person, place, and time. No cranial nerve deficit.  Skin: Skin is warm. No rash noted. Nails show no clubbing.  Bruising bilateral lower face and neck  Psychiatric: She has a normal mood and affect.      Data Reviewed: Basic Metabolic Panel:  Recent Labs Lab 03/27/17 1857 03/28/17 0438 03/28/17 0820 03/29/17 0421  NA 131* 134*  --  134*  K 5.3* 4.1  --  4.2  CL 90* 93*  --  95*  CO2 23 32  --  33*  GLUCOSE 217* 173*  --  141*  BUN 91* 75*  --  52*  CREATININE 1.56* 0.99  --  0.83  CALCIUM 8.3* 8.2*  --  7.8*  MG  --   --  1.6*  --    CBC:  Recent Labs Lab 03/27/17 1857 03/27/17 2258 03/28/17 0438 03/28/17 0820 03/29/17 0421 03/30/17 0548  WBC 20.2*  --  23.8*  --  17.2* 10.6  NEUTROABS 17.9*  --   --   --   --   --   HGB 5.9* 9.1* 8.6* 8.2* 6.9* 7.5*  HCT 19.8*  --  26.3*  --  20.8* 22.6*  MCV 80.6  --  79.7*  --  82.5 83.1  PLT 367  --  276  --  249 211  BNP (last 3 results)  Recent Labs  04/01/16 1244 04/02/16 0413 07/26/16 1159  BNP 126.0* 237.0* 504.0*    CBG:  Recent Labs Lab 03/29/17 1153 03/29/17 1638 03/29/17 2107 03/30/17 0751 03/30/17 1141  GLUCAP 257* 204* 136* 121* 138*     Scheduled Meds: . atorvastatin  20 mg Oral Daily  . azaTHIOprine  100 mg Oral Daily  . gabapentin  600 mg Oral BID  . insulin aspart  0-5 Units Subcutaneous QHS  . insulin aspart  0-9 Units Subcutaneous TID WC  . metoprolol tartrate  12.5 mg Oral BID  . oxybutynin  5 mg Oral BID  . pantoprazole  40 mg Oral Daily  . rOPINIRole  4 mg Oral QHS  . sodium chloride flush  3 mL Intravenous Q12H  . traZODone  200 mg Oral QHS  . venlafaxine XR  225 mg Oral Daily  . warfarin  5 mg Oral q1800  . Warfarin - Pharmacist Dosing Inpatient   Does not apply q1800   Continuous Infusions: . heparin 1,500 Units/hr (03/30/17 0233)     Assessment/Plan:  1. Acute blood loss anemia. Hemorrhage after oral surgery procedure to remove 4 teeth. The patient was transfused 3 units of packed red blood cells. Hemoglobin 7.5 today. Recheck hemoglobin tomorrow. 2. Metallic valve. Patient's Coumadin was reversed with 10 mg of IV vitamin K and Kcentra. It will likely take a while to get the patient's Coumadin level up again. On IV heparin and pharmacy dosing Coumadin. Daily INR checks. 3. Atrial fibrillation with rapid ventricular response. With blood pressure being on the lower side I will have to decrease the dose the patient's metoprolol to 12.5 mg twice a day. Heart rate better controlled at this point and patient condition guarded to normal sinus rhythm 4. Hypomagnesemia. Replaced previously 5. Hyperlipidemia unspecified on atorvastatin 6. 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 Type 2 diabetes mellitus on sliding scale 8. Urinary incontinence on oxybutynin 9. Restless leg syndrome on Requip. Extra dose of Requip ordered if needed 10. Home health physical therapy recommended by PT  Code Status:     Code Status Orders        Start     Ordered   03/27/17 2252  Full code  Continuous     03/27/17 2251    Code Status History    Date Active Date Inactive Code Status Order ID Comments User Context   06/26/2016  1:06 AM 06/28/2016  4:39 PM Full Code [id number]  Lance Coon, MD Inpatient   05/10/2016  4:38 PM 05/12/2016  4:59 PM Full Code [id number]  Theodoro Grist, MD ED   04/01/2016  5:13 PM 04/03/2016  5:58 AM Full Code [id number]  Henreitta Leber, MD Inpatient     Disposition Plan: Likely will be with [place] for a while since they reverse the Coumadin with 10 mg of IV vitamin K. Need to have Coumadin level therapeutic prior to going home. Patient does not want to do Lovenox injections.  Time spent: 24 minutes  Loletha Grayer  [employer]

## 2017-03-31 LAB — GLUCOSE, CAPILLARY
GLUCOSE-CAPILLARY: 125 mg/dL — AB (ref 65–99)
GLUCOSE-CAPILLARY: 105 mg/dL — AB (ref 65–99)
GLUCOSE-CAPILLARY: 138 mg/dL — AB (ref 65–99)
Glucose-Capillary: 116 mg/dL — ABNORMAL HIGH (ref 65–99)
Glucose-Capillary: 114 mg/dL — ABNORMAL HIGH (ref 65–99)

## 2017-03-31 LAB — PROTIME-INR
INR: 1.06
PROTHROMBIN TIME: 13.8 s (ref 11.4–15.2)

## 2017-03-31 LAB — CBC
HCT: 24.6 % — ABNORMAL LOW (ref 35.0–47.0)
HEMOGLOBIN: 8 g/dL — AB (ref 12.0–16.0)
MCH: 27.3 pg (ref 26.0–34.0)
MCHC: 32.6 g/dL (ref 32.0–36.0)
MCV: 83.9 fL (ref 80.0–100.0)
Platelets: 255 10*3/uL (ref 150–440)
RBC: 2.93 MIL/uL — AB (ref 3.80–5.20)
RDW: 19.5 % — ABNORMAL HIGH (ref 11.5–14.5)
WBC: 10.9 10*3/uL (ref 3.6–11.0)

## 2017-03-31 LAB — HEPARIN LEVEL (UNFRACTIONATED): HEPARIN UNFRACTIONATED: 0.51 [IU]/mL (ref 0.30–0.70)

## 2017-03-31 LAB — HAPTOGLOBIN: Haptoglobin: <10 mg/dL — ABNORMAL LOW (ref 34–200)

## 2017-03-31 MED ORDER — DIPHENHYDRAMINE HCL 25 MG PO CAPS
25.0000 mg | ORAL_CAPSULE | Freq: Once | ORAL | Status: AC
  Administered 2017-03-31: 25 mg via ORAL
  Filled 2017-03-31: qty 1

## 2017-03-31 MED ORDER — DIPHENHYDRAMINE-ZINC ACETATE 2-0.1 % EX CREA
TOPICAL_CREAM | Freq: Two times a day (BID) | CUTANEOUS | Status: DC | PRN
  Filled 2017-03-31: qty 28

## 2017-03-31 MED ORDER — WARFARIN SODIUM 2 MG PO TABS
5.0000 mg | ORAL_TABLET | Freq: Every day | ORAL | Status: DC
  Administered 2017-04-01 – 2017-04-03 (×3): 5 mg via ORAL
  Filled 2017-03-31: qty 3
  Filled 2017-03-31: qty 1
  Filled 2017-03-31: qty 3
  Filled 2017-03-31: qty 2

## 2017-03-31 MED ORDER — WARFARIN SODIUM 5 MG PO TABS
7.5000 mg | ORAL_TABLET | Freq: Once | ORAL | Status: AC
  Administered 2017-03-31: 7.5 mg via ORAL
  Filled 2017-03-31: qty 2

## 2017-03-31 MED ORDER — DILTIAZEM HCL ER COATED BEADS 180 MG PO CP24
180.0000 mg | ORAL_CAPSULE | Freq: Every day | ORAL | Status: DC
  Administered 2017-04-02 – 2017-04-09 (×7): 180 mg via ORAL
  Filled 2017-03-31 (×11): qty 1

## 2017-03-31 MED ORDER — DILTIAZEM HCL 60 MG PO TABS
60.0000 mg | ORAL_TABLET | Freq: Once | ORAL | Status: DC
  Administered 2017-03-31: 60 mg via ORAL
  Filled 2017-03-31: qty 1

## 2017-03-31 NOTE — Progress Notes (Signed)
Patient ID: Betty [name], female   DOB: 02/23/1946, 71 y.o.   MRN: [id number]  [facility] PROGRESS NOTE  Betty [name] WUJ:[id number] DOB: [birth year] DOA: 03/27/2017 PCP: Dion Body, MD  HPI/Subjective: Patient again refused Lovenox injections to go home with. He feels a little bit dizzy at times a little bit weak.  Objective: Vitals:   03/31/17 0807 03/31/17 1350  BP: (!) 131/56 (!) 104/48  Pulse: 96 73  Resp: (!) 22 12  Temp: 97.9 F (36.6 C)     Filed Weights   03/29/17 0612 03/30/17 0500 03/31/17 0401  Weight: 103.1 kg (227 lb 5.4 oz) 103.1 kg (227 lb 3.2 oz) 105.2 kg (232 lb)    ROS: Review of Systems  Constitutional: Negative for chills and fever.  Eyes: Negative for blurred vision.  Respiratory: Negative for cough and shortness of breath.   Cardiovascular: Negative for chest pain.  Gastrointestinal: Negative for abdominal pain, constipation, diarrhea, nausea and vomiting.  Genitourinary: Negative for dysuria.  Musculoskeletal: Negative for joint pain.  Neurological: Positive for dizziness and weakness. Negative for headaches.   Exam: Physical Exam  Constitutional: She is oriented to person, place, and time.  HENT:  Nose: No mucosal edema.  Mouth/Throat: No oropharyngeal exudate or posterior oropharyngeal edema.  Eyes: Conjunctivae, EOM and lids are normal. Pupils are equal, round, and reactive to light.  Neck: No JVD present. Carotid bruit is not present. No edema present. No thyroid mass and no thyromegaly present.  Cardiovascular: S1 normal and S2 normal.  Exam reveals no gallop.   No murmur heard. Pulses:      Dorsalis pedis pulses are 2+ on the right side, and 2+ on the left side.  Respiratory: No respiratory distress. She has no wheezes. She has no rhonchi. She has no rales.  GI: Soft. Bowel sounds are normal. There is no tenderness.  Musculoskeletal:       Right ankle: She exhibits no swelling.       Left ankle: She exhibits no swelling.   Lymphadenopathy:    She has no cervical adenopathy.  Neurological: She is alert and oriented to person, place, and time. No cranial nerve deficit.  Skin: Skin is warm. No rash noted. Nails show no clubbing.  Bruising bilateral lower face and neck  Psychiatric: She has a normal mood and affect.      Data Reviewed: Basic Metabolic Panel:  Recent Labs Lab 03/27/17 1857 03/28/17 0438 03/28/17 0820 03/29/17 0421  NA 131* 134*  --  134*  K 5.3* 4.1  --  4.2  CL 90* 93*  --  95*  CO2 23 32  --  33*  GLUCOSE 217* 173*  --  141*  BUN 91* 75*  --  52*  CREATININE 1.56* 0.99  --  0.83  CALCIUM 8.3* 8.2*  --  7.8*  MG  --   --  1.6*  --    CBC:  Recent Labs Lab 03/27/17 1857  03/28/17 0438 03/28/17 0820 03/29/17 0421 03/30/17 0548 03/31/17 0408  WBC 20.2*  --  23.8*  --  17.2* 10.6 10.9  NEUTROABS 17.9*  --   --   --   --   --   --   HGB 5.9*  < > 8.6* 8.2* 6.9* 7.5* 8.0*  HCT 19.8*  --  26.3*  --  20.8* 22.6* 24.6*  MCV 80.6  --  79.7*  --  82.5 83.1 83.9  PLT 367  --  276  --  249 211 255  < > = values in this interval not displayed.  BNP (last 3 results)  Recent Labs  04/01/16 1244 04/02/16 0413 07/26/16 1159  BNP 126.0* 237.0* 504.0*    CBG:  Recent Labs Lab 03/30/17 1141 03/30/17 1647 03/30/17 2100 03/31/17 0810 03/31/17 1200  GLUCAP 138* 192* 125* 105* 116*     Scheduled Meds: . atorvastatin  20 mg Oral Daily  . azaTHIOprine  100 mg Oral Daily  . gabapentin  600 mg Oral BID  . insulin aspart  0-5 Units Subcutaneous QHS  . insulin aspart  0-9 Units Subcutaneous TID WC  . metoprolol tartrate  12.5 mg Oral BID  . oxybutynin  5 mg Oral BID  . pantoprazole  40 mg Oral Daily  . rOPINIRole  4 mg Oral QHS  . sodium chloride flush  3 mL Intravenous Q12H  . traZODone  200 mg Oral QHS  . venlafaxine XR  225 mg Oral Daily  . [START ON 04/01/2017] warfarin  5 mg Oral q1800  . warfarin  7.5 mg Oral ONCE-1800  . Warfarin - Pharmacist Dosing Inpatient    Does not apply q1800   Continuous Infusions: . heparin 1,500 Units/hr (03/30/17 5188)    Assessment/Plan:  1. Acute blood loss anemia. Hemorrhage after oral surgery procedure to remove 4 teeth. The patient was transfused 3 units of packed red blood cells. Hemoglobin 8.6 today. Hemoglobin stabilized. 2. Metallic valve. Patient's Coumadin was reversed with 10 mg of IV vitamin K and Kcentra. It will likely take a while to get the patient's Coumadin level up again. On IV heparin and pharmacy dosing Coumadin. Daily INR checks. Patient refused Lovenox injections to go home with. This is given a take a while to get her Coumadin level therapeutic 3. Atrial fibrillation with rapid ventricular response. With blood pressure being on the lower side I will have to decrease the dose the patient's metoprolol to 12.5 mg twice a day. Heart rate better controlled at this point and patient converted to normal sinus rhythm 4. Hypomagnesemia. Replaced previously 5. Hyperlipidemia unspecified on atorvastatin 6. 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 Type 2 diabetes mellitus on sliding scale 8. Urinary incontinence on oxybutynin 9. Restless leg syndrome on Requip. Extra dose of Requip ordered if needed 10. Home health physical therapy recommended by PT  Code Status:     Code Status Orders        Start     Ordered   03/27/17 2252  Full code  Continuous     03/27/17 2251    Code Status History    Date Active Date Inactive Code Status Order ID Comments User Context   06/26/2016  1:06 AM 06/28/2016  4:39 PM Full Code [id number]  Lance Coon, MD Inpatient   05/10/2016  4:38 PM 05/12/2016  4:59 PM Full Code [id number]  Theodoro Grist, MD ED   04/01/2016  5:13 PM 04/03/2016  5:58 AM Full Code [id number]  Henreitta Leber, MD Inpatient     Disposition Plan: Home once INR therapeutic  Time spent: 22 minutes  [place], [facility]

## 2017-03-31 NOTE — Care Management (Addendum)
Patient is much improved .  Physical therapy evaluated 4/6 and recommended home health physical therapy.  Patient may benefit from home health nurse for skilled assessment.  No need for assistive ambulation devices.  Prior to this episode of illness, Independent in all adls, denies issues accessing medical care, obtaining medications or with transportation. Able to drive.  Current with her PCP.  Heads up referral to [facility] for home health nurse and physical therapy as agency is in network with patient's medicare [employer] policy. Patient informed CM that her dentist did not have her hold her coumadin prior to her procedure

## 2017-03-31 NOTE — Progress Notes (Signed)
Coumadin dose scheduled for 1800. Confirmed with pharmacy that we are restarted her coumadin.

## 2017-03-31 NOTE — Progress Notes (Signed)
ECG reveals NSR. Patient was transferred via RN and nurse secretary to [facility] without any incidences. Report given to [facility].

## 2017-03-31 NOTE — Progress Notes (Signed)
Notified physician about patient not receiving home dose of cardizem. Patient states she is SOB. Pulse ox is 97%on room air. Pulse rate is 112. Cardiac medications were reduced due to low blood pressure. Received orders for oral cardizem. Will obtain an ECG.

## 2017-04-01 LAB — GLUCOSE, CAPILLARY
GLUCOSE-CAPILLARY: 182 mg/dL — AB (ref 65–99)
Glucose-Capillary: 128 mg/dL — ABNORMAL HIGH (ref 65–99)
Glucose-Capillary: 145 mg/dL — ABNORMAL HIGH (ref 65–99)
Glucose-Capillary: 166 mg/dL — ABNORMAL HIGH (ref 65–99)

## 2017-04-01 LAB — PROTIME-INR
INR: 1.11
Prothrombin Time: 14.4 seconds (ref 11.4–15.2)

## 2017-04-01 LAB — HEPARIN LEVEL (UNFRACTIONATED): Heparin Unfractionated: 0.35 IU/mL (ref 0.30–0.70)

## 2017-04-01 LAB — HEMOGLOBIN: HEMOGLOBIN: 7.5 g/dL — AB (ref 12.0–16.0)

## 2017-04-01 MED ORDER — DIPHENHYDRAMINE HCL 25 MG PO CAPS
25.0000 mg | ORAL_CAPSULE | Freq: Three times a day (TID) | ORAL | Status: DC | PRN
Start: 2017-04-01 — End: 2017-04-09
  Administered 2017-04-01 – 2017-04-06 (×5): 25 mg via ORAL
  Filled 2017-04-01 (×6): qty 1

## 2017-04-01 MED ORDER — SIMETHICONE 80 MG PO CHEW
80.0000 mg | CHEWABLE_TABLET | Freq: Once | ORAL | Status: AC
  Administered 2017-04-01: 80 mg via ORAL
  Filled 2017-04-01: qty 1

## 2017-04-01 NOTE — Progress Notes (Signed)
Called Dr. Marcille Blanco regarding patient's hemoglobin- 7.5.  Doctor will continue to monitor patient.  Christene Slates  04/01/2017  6:23 AM

## 2017-04-01 NOTE — Progress Notes (Signed)
qPhysical Therapy Treatment Patient Details Name: Betty [name] MRN: [id number] DOB: 1946/01/25 Today's Date: 04/01/2017    History of Present Illness  71 y.o. female who presents with Oral hemorrhage after dental extraction. Patient had 4 teeth extracted a dentist office earlier today, and had significant hemorrhage afterwards. Here in the ED she was initially hypotensive and had hemoglobin of around 5. Patient is on warfarin for chronic anticoagulation for mechanical mitral valve and atrial fibrillation.  Her INR initially in the ED was supratherapeutic at 4.9.  Pt recieved 2 units of blood.    PT Comments    Pt was eager to go for a longer walk but did get more fatigued than she expected with the effort.  Her vitals all remained stable but she did need a seated rest break and focused breathing.  Pt with no LOBs, though she did still have slight limp (again per baseline) that did not effect her safety.  Overall pt did well, did not show any overt balance issues, though she generally had some minimal guarding t/o the session.   Follow Up Recommendations  Home health PT     Equipment Recommendations       Recommendations for Other Services       Precautions / Restrictions Precautions Precautions: Fall Restrictions Weight Bearing Restrictions: No    Mobility  Bed Mobility Overal bed mobility: Independent             General bed mobility comments: Pt able to get up to sitting EOB w/o assist  Transfers Overall transfer level: Independent Equipment used: None             General transfer comment: Pt was able to stand up w/o assist  Ambulation/Gait Ambulation/Gait assistance: Supervision Ambulation Distance (Feet): 200 Feet Assistive device: Rolling walker (2 wheeled)       General Gait Details: Pt walked with a little more speed and consistency today.  She did fatigue with the effort, but O2 remained in the mid 90s, HR in 90s and BP 145/58 after ambulation. She  showed good effort but did have considerable fatigue upon returning the room needing seated rest break.   Stairs            Wheelchair Mobility    Modified Rankin (Stroke Patients Only)       Balance Overall balance assessment: Modified Independent                                          Cognition Arousal/Alertness: Awake/alert Behavior During Therapy: WFL for tasks assessed/performed Overall Cognitive Status: Within Functional Limits for tasks assessed                                        Exercises General Exercises - Lower Extremity Ankle Circles/Pumps: AROM;10 reps Long Arc Quad: Strengthening;10 reps Heel Slides: Strengthening;10 reps Hip ABduction/ADduction: Strengthening;10 reps Hip Flexion/Marching: Strengthening;10 reps    General Comments        Pertinent Vitals/Pain Pain Assessment: No/denies pain Pain Score: 0-No pain    Home Living                      Prior Function            PT Goals (current goals can now be  found in the care plan section) Progress towards PT goals: Progressing toward goals    Frequency    Min 2X/week      PT Plan Current plan remains appropriate    Co-evaluation             End of Session Equipment Utilized During Treatment: Gait belt Activity Tolerance: Patient limited by fatigue;Patient tolerated treatment well Patient left: in bed;with call bell/phone within reach Nurse Communication: Mobility status PT Visit Diagnosis: Muscle weakness (generalized) (M62.81);Difficulty in walking, not elsewhere classified (R26.2)     Time: 3570-1779 PT Time Calculation (min) (ACUTE ONLY): 25 min  Charges:  $Gait Training: 8-22 mins $Therapeutic Exercise: 8-22 mins                    G Codes:       Kreg Shropshire, DPT 04/01/2017, 5:12 PM

## 2017-04-01 NOTE — Progress Notes (Signed)
ANTICOAGULATION CONSULT NOTE - Initial Consult  Pharmacy Consult for warfarin dosing with heparin drip bridge Indication: mechanical mitral valve/atrial fibrillation  Allergies  Allergen Reactions  . Amiodarone Other (See Comments)    Pt states that this medication causes lung bleeding.      Patient Measurements: Height: 5\' 9"  (175.3 cm) Weight: 233 lb (105.7 kg) IBW/kg (Calculated) : 66.2 Heparin Dosing Weight: 90kg  Vital Signs: Temp: 98.4 F (36.9 C) (04/10 0638) Temp Source: Oral (04/10 1497) BP: 105/51 (04/10 0263) Pulse Rate: 79 (04/10 0638)  Labs:  Recent Labs  03/30/17 0548  03/30/17 2043 03/31/17 0406 03/31/17 0408 04/01/17 0534  HGB 7.5*  --   --   --  8.0* 7.5*  HCT 22.6*  --   --   --  24.6*  --   PLT 211  --   --   --  255  --   LABPROT 13.5  --   --  13.8  --  14.4  INR 1.03  --   --  1.06  --  1.11  HEPARINUNFRC 0.28*  < > 0.33 0.51  --  0.35  < > = values in this interval not displayed.  Estimated Creatinine Clearance: 81.6 mL/min (by C-G formula based on SCr of 0.83 mg/dL).   Medical History: Past Medical History:  Diagnosis Date  . Acute diastolic heart failure ([place])   . Allergy   . 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 Asthma   . Atrial fibrillation ([place])   . Backache, unspecified   . Cardiomegaly   . COPD (chronic obstructive pulmonary disease) ([place])   . Diabetes mellitus without complication ([place])   . 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 Esophageal reflux   . Headache(784.0)   . Herpes zoster without mention of complication   . Hx: UTI (urinary tract infection)   . Hypertension    heart controlled w CHF  . Nontoxic uninodular goiter   . Obesity, unspecified   . Osteoarthrosis, unspecified whether generalized or localized, unspecified site   . Unspecified sleep apnea   . Urine incontinence    hx of    Medications:  Patient on warfarin at home 5 mg every day except Thursday, 7.5 mg on  Thursdays.  Assessment: Patient admitted on 4/5 with significant bleeding from mouth s/p extraction of 4 teeth. Patient was hypotensive on admission with a Hgb of 5.9 and INR of 4.9. She received vitamin K and KCentra in ED on 4/5. Appears patient recently was taking azithromycin per med rec.  Patient was taking warfarin prior to admission for a mechanical mitral valve as well as atrial fibrillation. Once bleeding stopped, patient was restarted on warfarin with a heparin drip bridge. INR subtherapeutic post-KCentra and vitamin K. Post KCentra INR ordered per protocol was discontinued by MD.  Dosing history: Date INR Dose Comments 4/5 4.9 -- KCentra and vitamin K 4/6 1.19; 5 mg 4/7  1.07; 7.5 mg ([place])  4/8        1.03; 5 mg   Goal of Therapy:  INR 2.5-3.5 Heparin level 0.3-0.7. Monitor platelets by anticoagulation protocol: Yes   Plan:  HL = 0.40 (therapeutic). Will continue heparin drip at current rate of 1300 units/hr and order confirmatory HL in 6 hours. CBC ordered daily with AM labs.  4/7 AM heparin level 0.40. Continue current regimen. Recheck CBC and heparin level with tomorrow AM labs.  Will order warfarin 5 mg PO dose this evening -  essentially new start after reversal so expect it will take several days to see increase in INR.  Laural Benes, PharmD, BCPS Clinical Pharmacist 04/01/2017,7:09 AM   4/7 1300 spoke with Dr. Earleen Newport and would like to give patient higher one time dose of warfarin since INR is trending down. Ordered warfarin 7.5 mg @ 1800 x 1 dose  4/8 AM heparin level 0.28. 1400 unit bolus and increase rate to 1500 units/hr. Recheck in 6 hours. INR 1.03. Daily warfarin dose of 5 mg reordered.  4/8: Heparin level resulted @ 0.54. Will recheck level @2100 .   4/8: HL @ 21:00 = 0.33 Will continue this pt on current rate and recheck HL on 4/9 @ 0500.   4/9 AM heparin level 0.51. Continue current regimen. Recheck CBC and heparin level tomorrow AM. INR 1.06.  Will give 7.5 mg warfarin x1 today and resume 5 mg daily tomorrow.  4/10 AM HL therapeutic. Continue current rate. Recheck CBC and HL tomorrow AM. INR 1.11. Received 7.5 mg dose yesterday. Will resume 5 mg po daily dose and recheck INR tomorrow with AM labs.    Navil Kole A. Jordan Hawks, PharmD, BCPS  04/01/17 7:09 AM

## 2017-04-01 NOTE — Progress Notes (Signed)
Patient ID: Betty [name], female   DOB: [birth year], 71 y.o.   MRN: [id number]  [facility] PROGRESS NOTE  Betty [name]:[id number] DOB: August 08, 1946 DOA: 03/27/2017 PCP: Dion Body, MD  HPI/Subjective: Patient feeling better.denies any complaints.hb down to 7.5.  Objective: Vitals:   04/01/17 1220 04/01/17 1222  BP: 132/90 (!) 126/56  Pulse: 88 81  Resp: 17   Temp: 98.3 F (36.8 C)     Filed Weights   03/30/17 0500 03/31/17 0401 04/01/17 0400  Weight: 103.1 kg (227 lb 3.2 oz) 105.2 kg (232 lb) 105.7 kg (233 lb)    ROS: Review of Systems  Constitutional: Negative for chills and fever.  Eyes: Negative for blurred vision.  Respiratory: Negative for cough and shortness of breath.   Cardiovascular: Negative for chest pain.  Gastrointestinal: Negative for abdominal pain, constipation, diarrhea, nausea and vomiting.  Genitourinary: Negative for dysuria.  Musculoskeletal: Negative for joint pain.  Neurological: Positive for weakness. Negative for dizziness and headaches.   Exam: Physical Exam  Constitutional: She is oriented to person, place, and time.  HENT:  Nose: No mucosal edema.  Mouth/Throat: No oropharyngeal exudate or posterior oropharyngeal edema.  Eyes: Conjunctivae, EOM and lids are normal. Pupils are equal, round, and reactive to light.  Neck: No JVD present. Carotid bruit is not present. No edema present. No thyroid mass and no thyromegaly present.  Cardiovascular: S1 normal and S2 normal.  Exam reveals no gallop.   No murmur heard. Pulses:      Dorsalis pedis pulses are 2+ on the right side, and 2+ on the left side.  Respiratory: No respiratory distress. She has no wheezes. She has no rhonchi. She has no rales.  GI: Soft. Bowel sounds are normal. There is no tenderness.  Musculoskeletal:       Right ankle: She exhibits no swelling.       Left ankle: She exhibits no swelling.  Lymphadenopathy:    She has no cervical adenopathy.  Neurological: She  is alert and oriented to person, place, and time. No cranial nerve deficit.  Skin: Skin is warm. No rash noted. Nails show no clubbing.  Bruising bilateral lower face and neck  Psychiatric: She has a normal mood and affect.      Data Reviewed: Basic Metabolic Panel:  Recent Labs Lab 03/27/17 1857 03/28/17 0438 03/28/17 0820 03/29/17 0421  NA 131* 134*  --  134*  K 5.3* 4.1  --  4.2  CL 90* 93*  --  95*  CO2 23 32  --  33*  GLUCOSE 217* 173*  --  141*  BUN 91* 75*  --  52*  CREATININE 1.56* 0.99  --  0.83  CALCIUM 8.3* 8.2*  --  7.8*  MG  --   --  1.6*  --    CBC:  Recent Labs Lab 03/27/17 1857  03/28/17 0438 03/28/17 0820 03/29/17 0421 03/30/17 0548 03/31/17 0408 04/01/17 0534  WBC 20.2*  --  23.8*  --  17.2* 10.6 10.9  --   NEUTROABS 17.9*  --   --   --   --   --   --   --   HGB 5.9*  < > 8.6* 8.2* 6.9* 7.5* 8.0* 7.5*  HCT 19.8*  --  26.3*  --  20.8* 22.6* 24.6*  --   MCV 80.6  --  79.7*  --  82.5 83.1 83.9  --   PLT 367  --  276  --  249 211  255  --   < > = values in this interval not displayed.  BNP (last 3 results)  Recent Labs  04/02/16 0413 07/26/16 1159  BNP 237.0* 504.0*    CBG:  Recent Labs Lab 03/31/17 1647 03/31/17 2134 04/01/17 0742 04/01/17 1134 04/01/17 1632  GLUCAP 114* 138* 128* 182* 145*     Scheduled Meds: . atorvastatin  20 mg Oral Daily  . azaTHIOprine  100 mg Oral Daily  . diltiazem  180 mg Oral Daily  . gabapentin  600 mg Oral BID  . insulin aspart  0-5 Units Subcutaneous QHS  . insulin aspart  0-9 Units Subcutaneous TID WC  . metoprolol tartrate  12.5 mg Oral BID  . oxybutynin  5 mg Oral BID  . pantoprazole  40 mg Oral Daily  . rOPINIRole  4 mg Oral QHS  . sodium chloride flush  3 mL Intravenous Q12H  . traZODone  200 mg Oral QHS  . venlafaxine XR  225 mg Oral Daily  . warfarin  5 mg Oral q1800  . Warfarin - Pharmacist Dosing Inpatient   Does not apply q1800   Continuous Infusions: . heparin 1,500 Units/hr  (04/01/17 0815)    Assessment/Plan:  1. Acute blood loss anemia. Hemorrhage after oral surgery procedure to remove 4 teeth. The patient was transfused 3 units of packed red blood cells. Hemoglobin7.5   2. Metallic valve. Patient's Coumadin was reversed with 10 mg of IV vitamin K and Kcentra. It will likely take a while to get the patient's Coumadin level up again. On IV heparin and pharmacy dosing Coumadin. Daily INR checks.INR still 1.1.   3. Atrial fibrillation with rapid ventricular response.soft BP;changed metoprolol dose. 4.  5. Hypomagnesemia. Replaced previously 6. Hyperlipidemia unspecified on atorvastatin 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 Type 2 diabetes mellitus on sliding scale 9. Urinary incontinence on oxybutynin 10. Restless leg syndrome on Requip. Extra dose of Requip ordered if needed 11. Home health physical therapy recommended by PT Discharge home when INR is more than 2.5 Code Status:     Code Status Orders        Start     Ordered   03/27/17 2252  Full code  Continuous     03/27/17 2251    Code Status History    Date Active Date Inactive Code Status Order ID Comments User Context   06/26/2016  1:06 AM 06/28/2016  4:39 PM Full Code [id number]  Lance Coon, MD Inpatient   05/10/2016  4:38 PM 05/12/2016  4:59 PM Full Code [id number]  Theodoro Grist, MD ED   04/01/2016  5:13 PM 04/03/2016  5:58 AM Full Code [id number]  Henreitta Leber, MD Inpatient     Disposition Plan: Likely will be with [place] for a while since they reverse the Coumadin with 10 mg of IV vitamin K. Need to have Coumadin level therapeutic prior to going home. Patient does not want to do Lovenox injections.  Time spent: 24 minutes  [facility]

## 2017-04-01 NOTE — Progress Notes (Signed)
Patient transferred from 2A. Alert and oriented, vital signs stable, ambulates to bedside commode independently.  Given pain medication once for chronic back pain.  Isolation precautions due to history of ESBL.  Hemoglobin holding steady at 8.0. Patient resting comfortably.  Phoebe Sharps N  04/01/2017 6:20 AM

## 2017-04-02 LAB — URINE CULTURE

## 2017-04-02 LAB — HEMOGLOBIN AND HEMATOCRIT, BLOOD
HCT: 22.5 % — ABNORMAL LOW (ref 35.0–47.0)
HCT: 24.7 % — ABNORMAL LOW (ref 35.0–47.0)
Hemoglobin: 7.2 g/dL — ABNORMAL LOW (ref 12.0–16.0)
Hemoglobin: 8 g/dL — ABNORMAL LOW (ref 12.0–16.0)

## 2017-04-02 LAB — PROTIME-INR
INR: 1.28
Prothrombin Time: 16.1 seconds — ABNORMAL HIGH (ref 11.4–15.2)

## 2017-04-02 LAB — GLUCOSE, CAPILLARY
GLUCOSE-CAPILLARY: 189 mg/dL — AB (ref 65–99)
Glucose-Capillary: 160 mg/dL — ABNORMAL HIGH (ref 65–99)
Glucose-Capillary: 185 mg/dL — ABNORMAL HIGH (ref 65–99)
Glucose-Capillary: 143 mg/dL — ABNORMAL HIGH (ref 65–99)

## 2017-04-02 LAB — CBC
HEMATOCRIT: 22.4 % — AB (ref 35.0–47.0)
HEMOGLOBIN: 7.2 g/dL — AB (ref 12.0–16.0)
MCH: 27.1 pg (ref 26.0–34.0)
MCHC: 32 g/dL (ref 32.0–36.0)
MCV: 84.5 fL (ref 80.0–100.0)
Platelets: 227 10*3/uL (ref 150–440)
RBC: 2.66 MIL/uL — ABNORMAL LOW (ref 3.80–5.20)
RDW: 20.2 % — AB (ref 11.5–14.5)
WBC: 9.5 10*3/uL (ref 3.6–11.0)

## 2017-04-02 LAB — HEPARIN LEVEL (UNFRACTIONATED)
HEPARIN UNFRACTIONATED: 0.24 [IU]/mL — AB (ref 0.30–0.70)
HEPARIN UNFRACTIONATED: 0.35 [IU]/mL (ref 0.30–0.70)
Heparin Unfractionated: 0.35 IU/mL (ref 0.30–0.70)

## 2017-04-02 LAB — PREPARE RBC (CROSSMATCH)

## 2017-04-02 MED ORDER — FUROSEMIDE 20 MG PO TABS
20.0000 mg | ORAL_TABLET | Freq: Two times a day (BID) | ORAL | Status: DC
  Administered 2017-04-02 – 2017-04-04 (×4): 20 mg via ORAL
  Filled 2017-04-02 (×4): qty 1

## 2017-04-02 MED ORDER — SODIUM CHLORIDE 0.9 % IV SOLN
Freq: Once | INTRAVENOUS | Status: AC
  Administered 2017-04-02: 17:00:00 via INTRAVENOUS

## 2017-04-02 MED ORDER — ALBUTEROL SULFATE HFA 108 (90 BASE) MCG/ACT IN AERS: 2.0000 | INHALATION_SPRAY | RESPIRATORY_TRACT | Status: DC | PRN

## 2017-04-02 MED ORDER — PIPERACILLIN-TAZOBACTAM 3.375 G IVPB
3.3750 g | Freq: Three times a day (TID) | INTRAVENOUS | Status: DC
  Filled 2017-04-02 (×3): qty 50

## 2017-04-02 MED ORDER — ALBUTEROL SULFATE (2.5 MG/3ML) 0.083% IN NEBU
2.5000 mg | INHALATION_SOLUTION | RESPIRATORY_TRACT | Status: DC | PRN
  Administered 2017-04-04: 2.5 mg via RESPIRATORY_TRACT
  Filled 2017-04-02: qty 3

## 2017-04-02 MED ORDER — HEPARIN BOLUS VIA INFUSION
1500.0000 [IU] | Freq: Once | INTRAVENOUS | Status: AC
  Administered 2017-04-02: 1500 [IU] via INTRAVENOUS
  Filled 2017-04-02: qty 1500

## 2017-04-02 MED ORDER — PIPERACILLIN-TAZOBACTAM 3.375 G IVPB
3.3750 g | Freq: Three times a day (TID) | INTRAVENOUS | Status: DC
  Administered 2017-04-02 – 2017-04-08 (×17): 3.375 g via INTRAVENOUS
  Filled 2017-04-02 (×19): qty 50

## 2017-04-02 MED ORDER — PROPAFENONE HCL ER 225 MG PO CP12
225.0000 mg | ORAL_CAPSULE | Freq: Two times a day (BID) | ORAL | Status: DC
  Administered 2017-04-03: 225 mg via ORAL
  Filled 2017-04-02 (×5): qty 1

## 2017-04-02 MED ORDER — METFORMIN HCL 500 MG PO TABS
500.0000 mg | ORAL_TABLET | Freq: Two times a day (BID) | ORAL | Status: DC
  Administered 2017-04-02 – 2017-04-09 (×14): 500 mg via ORAL
  Filled 2017-04-02 (×14): qty 1

## 2017-04-02 MED ORDER — PROPAFENONE HCL ER 225 MG PO CP12
225.0000 mg | ORAL_CAPSULE | Freq: Two times a day (BID) | ORAL | Status: DC
  Filled 2017-04-02 (×3): qty 1

## 2017-04-02 MED ORDER — SPIRONOLACTONE 25 MG PO TABS
25.0000 mg | ORAL_TABLET | Freq: Every day | ORAL | Status: DC
  Administered 2017-04-02 – 2017-04-09 (×8): 25 mg via ORAL
  Filled 2017-04-02 (×8): qty 1

## 2017-04-02 MED ORDER — FERROUS SULFATE 325 (65 FE) MG PO TABS
325.0000 mg | ORAL_TABLET | Freq: Every day | ORAL | Status: DC
  Administered 2017-04-03 – 2017-04-09 (×7): 325 mg via ORAL
  Filled 2017-04-02 (×7): qty 1

## 2017-04-02 NOTE — Progress Notes (Signed)
Patient ID: Betty [name], female   DOB: 08-25-1946, 71 y.o.   MRN: [id number]  [facility] PROGRESS NOTE  Betty [name] QIW:[id number] DOB: January 18, 1946 DOA: 03/27/2017 PCP: Dion Body, MD  HPI/Subjective: c/o fatigue, this morning she had wheezing or shortness of breath. Not hypoxic. Hemoglobin down to 7.2. No chest pain or shortness of breath at this time, no abdominal pain. Sister at bedside.  Objective: Vitals:   04/01/17 2102 04/02/17 0426  BP: (!) 113/51 121/69  Pulse: 87 83  Resp:  16  Temp:  98.7 F (37.1 C)    Filed Weights   03/31/17 0401 04/01/17 0400 04/02/17 0500  Weight: 105.2 kg (232 lb) 105.7 kg (233 lb) 107.9 kg (237 lb 14.4 oz)    ROS: Review of Systems  Constitutional: Negative for chills and fever.  Eyes: Negative for blurred vision.  Respiratory: Negative for cough and shortness of breath.   Cardiovascular: Negative for chest pain.  Gastrointestinal: Negative for abdominal pain, constipation, diarrhea, nausea and vomiting.  Genitourinary: Negative for dysuria.  Musculoskeletal: Negative for joint pain.  Neurological: Positive for weakness. Negative for dizziness and headaches.   Exam: Physical Exam  Constitutional: She is oriented to person, place, and time.  HENT:  Nose: No mucosal edema.  Mouth/Throat: No oropharyngeal exudate or posterior oropharyngeal edema.  Eyes: Conjunctivae, EOM and lids are normal. Pupils are equal, round, and reactive to light.  Neck: No JVD present. Carotid bruit is not present. No edema present. No thyroid mass and no thyromegaly present.  Cardiovascular: S1 normal and S2 normal.  Exam reveals no gallop.   No murmur heard. Pulses:      Dorsalis pedis pulses are 2+ on the right side, and 2+ on the left side.  Respiratory: No respiratory distress. She has no wheezes. She has no rhonchi. She has no rales.  GI: Soft. Bowel sounds are normal. There is no tenderness.  Musculoskeletal:       Right ankle: She exhibits  no swelling.       Left ankle: She exhibits no swelling.  Lymphadenopathy:    She has no cervical adenopathy.  Neurological: She is alert and oriented to person, place, and time. No cranial nerve deficit.  Skin: Skin is warm. No rash noted. Nails show no clubbing.  Bruising bilateral lower face and neck  Psychiatric: She has a normal mood and affect.      Data Reviewed: Basic Metabolic Panel:  Recent Labs Lab 03/27/17 1857 03/28/17 0438 03/28/17 0820 03/29/17 0421  NA 131* 134*  --  134*  K 5.3* 4.1  --  4.2  CL 90* 93*  --  95*  CO2 23 32  --  33*  GLUCOSE 217* 173*  --  141*  BUN 91* 75*  --  52*  CREATININE 1.56* 0.99  --  0.83  CALCIUM 8.3* 8.2*  --  7.8*  MG  --   --  1.6*  --    CBC:  Recent Labs Lab 03/27/17 1857  03/28/17 0438  03/29/17 0421 03/30/17 0548 03/31/17 0408 04/01/17 0534 04/02/17 0511  WBC 20.2*  --  23.8*  --  17.2* 10.6 10.9  --  9.5  NEUTROABS 17.9*  --   --   --   --   --   --   --   --   HGB 5.9*  < > 8.6*  < > 6.9* 7.5* 8.0* 7.5* 7.2*  HCT 19.8*  --  26.3*  --  20.8*  22.6* 24.6*  --  22.4*  MCV 80.6  --  79.7*  --  82.5 83.1 83.9  --  84.5  PLT 367  --  276  --  249 211 255  --  227  < > = values in this interval not displayed.  BNP (last 3 results)  Recent Labs  07/26/16 1159  BNP 504.0*    CBG:  Recent Labs Lab 04/01/17 1134 04/01/17 1632 04/01/17 2100 04/02/17 0747 04/02/17 1125  GLUCAP 182* 145* 166* 189* 160*     Scheduled Meds: . sodium chloride   Intravenous Once  . atorvastatin  20 mg Oral Daily  . azaTHIOprine  100 mg Oral Daily  . diltiazem  180 mg Oral Daily  . [START ON 04/03/2017] ferrous sulfate  325 mg Oral Q breakfast  . furosemide  20 mg Oral BID  . gabapentin  600 mg Oral BID  . insulin aspart  0-5 Units Subcutaneous QHS  . insulin aspart  0-9 Units Subcutaneous TID WC  . metoprolol tartrate  12.5 mg Oral BID  . oxybutynin  5 mg Oral BID  . pantoprazole  40 mg Oral Daily  . rOPINIRole  4 mg  Oral QHS  . sodium chloride flush  3 mL Intravenous Q12H  . spironolactone  25 mg Oral Daily  . traZODone  200 mg Oral QHS  . venlafaxine XR  225 mg Oral Daily  . warfarin  5 mg Oral q1800  . Warfarin - Pharmacist Dosing Inpatient   Does not apply q1800   Continuous Infusions: . heparin 1,700 Units/hr (04/02/17 0845)    Assessment/Plan:  1. Acute blood loss anemia. Hemorrhage after oral surgery procedure to remove 4 teeth. The patient was transfused 3 units of packed red blood cells. Hemoglobin7.2. Because of fatigue or, shortness of breath ordered 1 unit of packed RBC to be given in her 3 hours. Discussed this with patient and patient's sister.   2. Metallic valve. Patient's Coumadin was reversed with 10 mg of IV vitamin K and Kcentra. It will likely take a while to get the patient's Coumadin level up again. On IV heparin and pharmacy dosing Coumadin. Daily INR checks.INR still 1.1.  INR more than 4 on admission. Continue Coumadin, heparin until INR becomes more than 2.5. Discussed this with patient, patient's sister extensively, also discussed about reversing the effect of Coumadin with vitamin K, [facility] when she came and why we did that.  3. Atrial fibrillation with rapid ventricular response; heart rate improved, rate 79 bpm at this time, continue Coumadin, continue heparin, and a new Cardizem, metoprolol, monitor on telemetry because patient felt short of breath this morning and because of her A. fib history and the history of metabolic aortic valve ,monitor her on the monitor to see if she is having any extra arrhythmias. 4.  5. Hypomagnesemia. Replaced previously 6. Hyperlipidemia unspecified on atorvastatin 7.  8. 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  10. Type 2 diabetes mellitus on sliding scale,restart metformin today 11.  12. Urinary incontinence on oxybutynin 13. Restless leg syndrome on Requip. Extra dose of Requip ordered if needed 14. Home health physical  therapy recommended by PT Discharge home when INR is more than 2.5 D/w sister  in detail about all the medications, baseline hemoglobin, INR onset her questions. Code Status:     Code Status Orders        Start     Ordered   03/27/17 2252  Full code  Continuous  03/27/17 2251    Code Status History    Date Active Date Inactive Code Status Order ID Comments User Context   06/26/2016  1:06 AM 06/28/2016  4:39 PM Full Code [id number]  Lance Coon, MD Inpatient   05/10/2016  4:38 PM 05/12/2016  4:59 PM Full Code [id number]  Theodoro Grist, MD ED   04/01/2016  5:13 PM 04/03/2016  5:58 AM Full Code [id number]  Henreitta Leber, MD Inpatient     Disposition Plan: Likely will be with [place] for a while since they reverse the Coumadin with 10 mg of IV vitamin K. Need to have Coumadin level therapeutic prior to going home. Patient does not want to do Lovenox injections.  Time spent: 24 minutes  [facility]

## 2017-04-02 NOTE — Progress Notes (Signed)
ANTICOAGULATION CONSULT NOTE - Initial Consult  Pharmacy Consult for warfarin dosing with heparin drip bridge Indication: mechanical mitral valve/atrial fibrillation       Allergies  Allergen Reactions  . Amiodarone Other (See Comments)    Pt states that this medication causes lung bleeding.      Patient Measurements: Height: 5\' 9"  (175.3 cm) Weight: 233 lb (105.7 kg) IBW/kg (Calculated) : 66.2 Heparin Dosing Weight: 90kg  Vital Signs: Temp: 98.4 F (36.9 C) (04/10 0638) Temp Source: Oral (04/10 0932) BP: 105/51 (04/10 6712) Pulse Rate: 79 (04/10 4580)  Labs:  Recent Labs (last 2 labs)    Recent Labs  03/30/17 0548  03/30/17 2043 03/31/17 0406 03/31/17 0408 04/01/17 0534  HGB 7.5*  --   --   --  8.0* 7.5*  HCT 22.6*  --   --   --  24.6*  --   PLT 211  --   --   --  255  --   LABPROT 13.5  --   --  13.8  --  14.4  INR 1.03  --   --  1.06  --  1.11  HEPARINUNFRC 0.28*  < > 0.33 0.51  --  0.35  < > = values in this interval not displayed.    Estimated Creatinine Clearance: 81.6 mL/min (by C-G formula based on SCr of 0.83 mg/dL).   Medical History:     Past Medical History:  Diagnosis Date  . Acute diastolic heart failure ([place])   . Allergy   . 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 Asthma   . Atrial fibrillation ([place])   . Backache, unspecified   . Cardiomegaly   . COPD (chronic obstructive pulmonary disease) ([place])   . Diabetes mellitus without complication ([place])   . 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 Esophageal reflux   . Headache(784.0)   . Herpes zoster without mention of complication   . Hx: UTI (urinary tract infection)   . Hypertension    heart controlled w CHF  . Nontoxic uninodular goiter   . Obesity, unspecified   . Osteoarthrosis, unspecified whether generalized or localized, unspecified site   . Unspecified sleep apnea   . Urine incontinence    hx of    Medications:   Patient on warfarin at home 5 mg every day except Thursday, 7.5 mg on Thursdays.  Assessment: Patient admitted on 4/5 with significant bleeding from mouth s/p extraction of 4 teeth. Patient was hypotensive on admission with a Hgb of 5.9 and INR of 4.9. She received vitamin K and KCentra in ED on 4/5. Appears patient recently was taking azithromycin per med rec.  Patient was taking warfarin prior to admission for a mechanical mitral valve as well as atrial fibrillation. Once bleeding stopped, patient was restarted on warfarin with a heparin drip bridge. INR subtherapeutic post-KCentra and vitamin K. Post KCentra INR ordered per protocol was discontinued by MD.  Dosing history: Date     INR      Dose    Comments 4/5       4.9       --          KCentra and vitamin K 4/6       1.19; 5 mg 4/7       1.07; 7.5 mg ([place])  4/8        1.03; 5 mg   Goal of Therapy:  INR 2.5-3.5 Heparin level 0.3-0.7. Monitor platelets by  anticoagulation protocol: Yes   Plan:  HL = 0.40 (therapeutic). Will continue heparin drip at current rate of 1300 units/hr and order confirmatory HL in 6 hours. CBC ordered daily with AM labs.  4/7 AM heparin level 0.40. Continue current regimen. Recheck CBC and heparin level with tomorrow AM labs.  Will order warfarin 5 mg PO dose this evening - essentially new start after reversal so expect it will take several days to see increase in INR.  4/7 1300 spoke with Dr. Earleen Newport and would like to give patient higher one time dose of warfarin since INR is trending down. Ordered warfarin 7.5 mg @ 1800 x 1 dose  4/8 AM heparin level 0.28. 1400 unit bolus and increase rate to 1500 units/hr. Recheck in 6 hours. INR 1.03. Daily warfarin dose of 5 mg reordered.  4/8: Heparin level resulted @ 0.54. Will recheck level @2100 .   4/8: HL @ 21:00 = 0.33 Will continue this pt on current rate and recheck HL on 4/9 @ 0500.   4/9 AM heparin level 0.51. Continue current regimen.  Recheck CBC and heparin level tomorrow AM. INR 1.06. Will give 7.5 mg warfarin x1 today and resume 5 mg daily tomorrow.  4/10 AM HL therapeutic. Continue current rate. Recheck CBC and HL tomorrow AM. INR 1.11. Received 7.5 mg dose yesterday. Will resume 5 mg po daily dose and recheck INR tomorrow with AM labs.   4/11 @ 0500 HL 0.24 subtherapeutic from goal. Will bolus w/ 1500 units IV x 1 and increase rate to 1700 units/hour and will recheck HL @ 1400. Recent H/H shows Hgb trending down: 8.0 - 7.5 - 7.2. May need to stop heparin if there is signs/symptoms of bleeding or Hgb continues to drop.  Thank you for this consult.  Tobie Lords, PharmD, BCPS Clinical Pharmacist 04/02/2017

## 2017-04-02 NOTE — Progress Notes (Addendum)
ANTICOAGULATION CONSULT NOTE - Follow up  [place] for warfarin dosing with heparin drip bridge Indication: mechanical mitral valve/atrial fibrillation       Allergies  Allergen Reactions  . Amiodarone Other (See Comments)    Pt states that this medication causes lung bleeding.      Patient Measurements: Height: 5\' 9"  (175.3 cm) Weight: 233 lb (105.7 kg) IBW/kg (Calculated) : 66.2 Heparin Dosing Weight: 90kg  Vital Signs: Temp: 98.4 F (36.9 C) (04/10 0638) Temp Source: Oral (04/10 2992) BP: 105/51 (04/10 4268) Pulse Rate: 79 (04/10 3419)  Labs:  Recent Labs (last 2 labs)    Recent Labs  03/30/17 0548  03/30/17 2043 03/31/17 0406 03/31/17 0408 04/01/17 0534  HGB 7.5*  --   --   --  8.0* 7.5*  HCT 22.6*  --   --   --  24.6*  --   PLT 211  --   --   --  255  --   LABPROT 13.5  --   --  13.8  --  14.4  INR 1.03  --   --  1.06  --  1.11  HEPARINUNFRC 0.28*  < > 0.33 0.51  --  0.35  < > = values in this interval not displayed.    Estimated Creatinine Clearance: 81.6 mL/min (by C-G formula based on SCr of 0.83 mg/dL).   Medical History:     Past Medical History:  Diagnosis Date  . Acute diastolic heart failure ([place])   . Allergy   . 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 Asthma   . Atrial fibrillation ([place])   . Backache, unspecified   . Cardiomegaly   . COPD (chronic obstructive pulmonary disease) ([place])   . Diabetes mellitus without complication ([place])   . 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 Esophageal reflux   . Headache(784.0)   . Herpes zoster without mention of complication   . Hx: UTI (urinary tract infection)   . Hypertension    heart controlled w CHF  . Nontoxic uninodular goiter   . Obesity, unspecified   . Osteoarthrosis, unspecified whether generalized or localized, unspecified site   . Unspecified sleep apnea   . Urine incontinence    hx of     Medications:  Patient on warfarin at home 5 mg every day except Thursday, 7.5 mg on Thursdays.  Assessment: Patient admitted on 4/5 with significant bleeding from mouth s/p extraction of 4 teeth. Patient was hypotensive on admission with a Hgb of 5.9 and INR of 4.9. She received vitamin K and KCentra in ED on 4/5. Appears patient recently was taking azithromycin per med rec.  Patient was taking warfarin prior to admission for a mechanical mitral valve as well as atrial fibrillation. Once bleeding stopped, patient was restarted on warfarin with a heparin drip bridge. INR subtherapeutic post-KCentra and vitamin K. Post KCentra INR ordered per protocol was discontinued by MD.  Dosing history: Date     INR      Dose    Comments 4/5       4.9       --          KCentra and vitamin K 4/6       1.19; 5 mg 4/7       1.07; 7.5 mg ([place])  4/8        1.03; 5 mg  4/9       1.06; 7.5 mg 4/10  1.11; 5 mg  4/11     1.28   Goal of Therapy:  INR 2.5-3.5 Heparin level 0.3-0.7. Monitor platelets by anticoagulation protocol: Yes   Plan:  HL = 0.40 (therapeutic). Will continue heparin drip at current rate of 1300 units/hr and order confirmatory HL in 6 hours. CBC ordered daily with AM labs.  4/7 AM heparin level 0.40. Continue current regimen. Recheck CBC and heparin level with tomorrow AM labs.  Will order warfarin 5 mg PO dose this evening - essentially new start after reversal so expect it will take several days to see increase in INR.  4/7 1300 spoke with Dr. Earleen Newport and would like to give patient higher one time dose of warfarin since INR is trending down. Ordered warfarin 7.5 mg @ 1800 x 1 dose  4/8 AM heparin level 0.28. 1400 unit bolus and increase rate to 1500 units/hr. Recheck in 6 hours. INR 1.03. Daily warfarin dose of 5 mg reordered.  4/8: Heparin level resulted @ 0.54. Will recheck level @2100 .   4/8: HL @ 21:00 = 0.33 Will continue this pt on current rate and  recheck HL on 4/9 @ 0500.   4/9 AM heparin level 0.51. Continue current regimen. Recheck CBC and heparin level tomorrow AM. INR 1.06. Will give 7.5 mg warfarin x1 today and resume 5 mg daily tomorrow.  4/10 AM HL therapeutic. Continue current rate. Recheck CBC and HL tomorrow AM. INR 1.11. Received 7.5 mg dose yesterday. Will resume 5 mg po daily dose and recheck INR tomorrow with AM labs.   4/11 @ 0500 HL 0.24 subtherapeutic from goal. Will bolus w/ 1500 units IV x 1 and increase rate to 1700 units/hour and will recheck HL @ 1400. Recent H/H shows Hgb trending down: 8.0 - 7.5 - 7.2. May need to stop heparin if there is signs/symptoms of bleeding or Hgb continues to drop.  4/12: Heparin level resulted @ 0.36, which is therapeutic. Will continue current gtt rate. Will recheck Heparin level 22:00. Will give warfarin 5 mg PO x 1.  Thank you for this consult. [name], PharmD  Clinical Pharmacist 04/02/2017

## 2017-04-03 LAB — BASIC METABOLIC PANEL
Anion gap: 5 (ref 5–15)
BUN: 11 mg/dL (ref 6–20)
CO2: 24 mmol/L (ref 22–32)
CREATININE: 0.67 mg/dL (ref 0.44–1.00)
Calcium: 8.4 mg/dL — ABNORMAL LOW (ref 8.9–10.3)
Chloride: 107 mmol/L (ref 101–111)
GFR calc non Af Amer: >60 mL/min (ref >60)
GLUCOSE: 129 mg/dL — AB (ref 65–99)
Potassium: 4 mmol/L (ref 3.5–5.1)
Sodium: 136 mmol/L (ref 135–145)

## 2017-04-03 LAB — GLUCOSE, CAPILLARY
GLUCOSE-CAPILLARY: 144 mg/dL — AB (ref 65–99)
GLUCOSE-CAPILLARY: 127 mg/dL — AB (ref 65–99)
Glucose-Capillary: 133 mg/dL — ABNORMAL HIGH (ref 65–99)
Glucose-Capillary: 128 mg/dL — ABNORMAL HIGH (ref 65–99)

## 2017-04-03 LAB — TYPE AND SCREEN
ABO/RH(D): A POS
ANTIBODY SCREEN: NEGATIVE
UNIT DIVISION: 0

## 2017-04-03 LAB — CBC
HCT: 25.7 % — ABNORMAL LOW (ref 35.0–47.0)
Hemoglobin: 8.1 g/dL — ABNORMAL LOW (ref 12.0–16.0)
MCH: 26.6 pg (ref 26.0–34.0)
MCHC: 31.6 g/dL — AB (ref 32.0–36.0)
MCV: 84.1 fL (ref 80.0–100.0)
PLATELETS: 242 10*3/uL (ref 150–440)
RBC: 3.05 MIL/uL — ABNORMAL LOW (ref 3.80–5.20)
RDW: 18.9 % — ABNORMAL HIGH (ref 11.5–14.5)
WBC: 9.6 10*3/uL (ref 3.6–11.0)

## 2017-04-03 LAB — PROTIME-INR
INR: 1.38
PROTHROMBIN TIME: 17.1 s — AB (ref 11.4–15.2)

## 2017-04-03 LAB — BPAM RBC
Blood Product Expiration Date: 201804272359
ISSUE DATE / TIME: 201804111749
UNIT TYPE AND RH: 6200

## 2017-04-03 LAB — HEPARIN LEVEL (UNFRACTIONATED): HEPARIN UNFRACTIONATED: 0.38 [IU]/mL (ref 0.30–0.70)

## 2017-04-03 MED ORDER — PROPAFENONE HCL 225 MG PO TABS
225.0000 mg | ORAL_TABLET | Freq: Two times a day (BID) | ORAL | Status: DC
  Filled 2017-04-03: qty 1

## 2017-04-03 MED ORDER — PROPAFENONE HCL ER 225 MG PO CP12
225.0000 mg | ORAL_CAPSULE | Freq: Two times a day (BID) | ORAL | Status: DC
  Administered 2017-04-03 – 2017-04-07 (×9): 225 mg via ORAL
  Filled 2017-04-03 (×11): qty 1

## 2017-04-03 NOTE — Plan of Care (Signed)
Problem: Fluid Volume: Goal: Ability to maintain a balanced intake and output will improve Outcome: Progressing Tolerating PRBCs; Hgb 8.0; no active bleeding noted overnight.

## 2017-04-03 NOTE — Progress Notes (Addendum)
ANTICOAGULATION CONSULT NOTE - Follow up  [place] for warfarin dosing with heparin drip bridge Indication: mechanical mitral valve/atrial fibrillation       Allergies  Allergen Reactions  . Amiodarone Other (See Comments)    Pt states that this medication causes lung bleeding.     Patient Measurements: Height: 5\' 9"  (175.3 cm) Weight: 233 lb (105.7 kg) IBW/kg (Calculated) : 66.2 Heparin Dosing Weight: 90kg  Vital Signs: Temp: 98.4 F (36.9 C) (04/10 0638) Temp Source: Oral (04/10 7342) BP: 105/51 (04/10 8768) Pulse Rate: 79 (04/10 1157)  Labs:  Recent Labs (last 2 labs)    Recent Labs  03/30/17 0548  03/30/17 2043 03/31/17 0406 03/31/17 0408 04/01/17 0534  HGB 7.5* --  --  --  8.0* 7.5*  HCT 22.6* --  --  --  24.6* --   PLT 211 --  --  --  255 --   LABPROT 13.5 --  --  13.8 --  14.4  INR 1.03 --  --  1.06 --  1.11  HEPARINUNFRC 0.28* <> 0.33 0.51 --  0.35  < > = values in this interval not displayed.    Estimated Creatinine Clearance: 81.6 mL/min (by C-G formula based on SCr of 0.83 mg/dL).   Medical History:     Past Medical History:  Diagnosis Date  . Acute diastolic heart failure ([place])   . Allergy   . 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 Asthma   . Atrial fibrillation ([place])   . Backache, unspecified   . Cardiomegaly   . COPD (chronic obstructive pulmonary disease) ([place])   . Diabetes mellitus without complication ([place])   . 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 Esophageal reflux   . Headache(784.0)   . Herpes zoster without mention of complication   . Hx: UTI (urinary tract infection)   . Hypertension    heart controlled w CHF  . Nontoxic uninodular goiter   . Obesity, unspecified   . Osteoarthrosis, unspecified whether generalized or localized, unspecified site   . Unspecified sleep apnea   . Urine incontinence    hx of     Medications:  Patient on warfarin at home 5 mg every day except Thursday, 7.5 mg on Thursdays.  Assessment: Patient admitted on 4/5 with significant bleeding from mouth s/p extraction of 4 teeth. Patient was hypotensive on admission with a Hgb of 5.9 and INR of 4.9. She received vitamin K and KCentra in ED on 4/5. Appears patient recently was taking azithromycin per med rec.  Patient was taking warfarin prior to admission for a mechanical mitral valve as well as atrial fibrillation. Once bleeding stopped, patient was restarted on warfarin with a heparin drip bridge. INR subtherapeutic post-KCentra and vitamin K. Post KCentra INR ordered per protocol was discontinued by MD.  Dosing history: DateINRDoseComments 4/54.9--KCentra and vitamin K 4/61.19; 5 mg 4/7 1.07; 7.5 mg ([place])  4/8 1.03; 5 mg  4/9       1.06; 7.5 mg 4/10     1.11; 5 mg  4/11     1.28; 5 mg 4/12:     1.38; 7.5 mg  Goal of Therapy: INR 2.5-3.5 Heparin level 0.3-0.7. Monitor platelets by anticoagulation protocol: Yes  Plan: HL = 0.40 (therapeutic). Will continue heparin drip at current rate of 1300 units/hr and order confirmatory HL in 6 hours. CBC ordered daily with AM labs.  4/7 AM heparin level 0.40. Continue current regimen. Recheck CBC and  heparin level with tomorrow AM labs.  Will order warfarin 5 mg PO dose this evening - essentially new start after reversal so expect it will take several days to see increase in INR.  4/7 1300 spoke with Dr. Earleen Newport and would like to give patient higher one time dose of warfarin since INR is trending down. Ordered warfarin 7.5 mg @ 1800 x 1 dose  4/8 AM heparin level 0.28. 1400 unit bolus and increase rate to 1500 units/hr. Recheck in 6 hours. INR 1.03. Daily warfarin dose of 5 mg reordered.  4/8: Heparin level resulted @ 0.54. Will recheck level @2100 .   4/8: HL @ 21:00 = 0.33 Will continue  this pt on current rate and recheck HL on 4/9 @ 0500.   4/9 AM heparin level 0.51. Continue current regimen. Recheck CBC and heparin level tomorrow AM. INR 1.06. Will give 7.5 mg warfarin x1 today and resume 5 mg daily tomorrow.  4/10 AM HL therapeutic. Continue current rate. Recheck CBC and HL tomorrow AM. INR 1.11. Received 7.5 mg dose yesterday. Will resume 5 mg po daily dose and recheck INR tomorrow with AM labs.   4/11 @ 0500 HL 0.24 subtherapeutic from goal. Will bolus w/ 1500 units IV x 1 and increase rate to 1700 units/hour and will recheck HL @ 1400. Recent H/H shows Hgb trending down: 8.0 - 7.5 - 7.2. May need to stop heparin if there is signs/symptoms of bleeding or Hgb continues to drop.  4/11: Heparin level resulted @ 0.36, which is therapeutic. Will continue current gtt rate. Will recheck Heparin level 22:00. Will give warfarin 5 mg PO x 1.   4/12 @ 0600 HL 0.38 therapeutic will continue current rate and will recheck HL w/ am labs. Will give warfarin 5 mg PO x 1 today.   Thank you for this consult.  [name], PharmD  Clinical Pharmacist 04/03/2017

## 2017-04-03 NOTE — Progress Notes (Signed)
Patient ID: Betty [name], female   DOB: [birth year], 71 y.o.   MRN: [id number]  [facility] PROGRESS NOTE  Betty [name] DJS:[id number] DOB: 30-Dec-1945 DOA: 03/27/2017 PCP: Dion Body, MD  HPI/Subjective: Says she is feeling better. But has wheezing at night. Also complains of fatigue which is slightly better than yesterday.  Objective: Vitals:   04/02/17 2115 04/03/17 0624  BP: (!) 102/40 126/63  Pulse: 77 100  Resp: 18 20  Temp: 98.7 F (37.1 C) 98 F (36.7 C)    Filed Weights   04/01/17 0400 04/02/17 0500 04/03/17 0624  Weight: 105.7 kg (233 lb) 107.9 kg (237 lb 14.4 oz) 108.4 kg (239 lb)    ROS: Review of Systems  Constitutional: Negative for chills and fever.  Eyes: Negative for blurred vision.  Respiratory: Negative for cough and shortness of breath.   Cardiovascular: Negative for chest pain.  Gastrointestinal: Negative for abdominal pain, constipation, diarrhea, nausea and vomiting.  Genitourinary: Negative for dysuria.  Musculoskeletal: Negative for joint pain.  Neurological: Positive for weakness. Negative for dizziness and headaches.   Exam: Physical Exam  Constitutional: She is oriented to person, place, and time.  HENT:  Nose: No mucosal edema.  Mouth/Throat: No oropharyngeal exudate or posterior oropharyngeal edema.  Eyes: Conjunctivae, EOM and lids are normal. Pupils are equal, round, and reactive to light.  Neck: No JVD present. Carotid bruit is not present. No edema present. No thyroid mass and no thyromegaly present.  Cardiovascular: S1 normal and S2 normal.  Exam reveals no gallop.   No murmur heard. Pulses:      Dorsalis pedis pulses are 2+ on the right side, and 2+ on the left side.  Respiratory: No respiratory distress. She has no wheezes. She has no rhonchi. She has no rales.  GI: Soft. Bowel sounds are normal. There is no tenderness.  Musculoskeletal:       Right ankle: She exhibits no swelling.       Left ankle: She exhibits no  swelling.  Lymphadenopathy:    She has no cervical adenopathy.  Neurological: She is alert and oriented to person, place, and time. No cranial nerve deficit.  Skin: Skin is warm. No rash noted. Nails show no clubbing.  Bruising bilateral lower face and neck  Psychiatric: She has a normal mood and affect.      Data Reviewed: Basic Metabolic Panel:  Recent Labs Lab 03/27/17 1857 03/28/17 0438 03/28/17 0820 03/29/17 0421 04/03/17 0552  NA 131* 134*  --  134* 136  K 5.3* 4.1  --  4.2 4.0  CL 90* 93*  --  95* 107  CO2 23 32  --  33* 24  GLUCOSE 217* 173*  --  141* 129*  BUN 91* 75*  --  52* 11  CREATININE 1.56* 0.99  --  0.83 0.67  CALCIUM 8.3* 8.2*  --  7.8* 8.4*  MG  --   --  1.6*  --   --    CBC:  Recent Labs Lab 03/27/17 1857  03/29/17 0421 03/30/17 0548 03/31/17 0408 04/01/17 0534 04/02/17 0511 04/02/17 1445 04/02/17 2232 04/03/17 0552  WBC 20.2*  < > 17.2* 10.6 10.9  --  9.5  --   --  9.6  NEUTROABS 17.9*  --   --   --   --   --   --   --   --   --   HGB 5.9*  < > 6.9* 7.5* 8.0* 7.5* 7.2* 7.2* 8.0* 8.1*  HCT 19.8*  < > 20.8* 22.6* 24.6*  --  22.4* 22.5* 24.7* 25.7*  MCV 80.6  < > 82.5 83.1 83.9  --  84.5  --   --  84.1  PLT 367  < > 249 211 255  --  227  --   --  242  < > = values in this interval not displayed.  BNP (last 3 results)  Recent Labs  07/26/16 1159  BNP 504.0*    CBG:  Recent Labs Lab 04/02/17 1125 04/02/17 1643 04/02/17 2144 04/03/17 0751 04/03/17 1133  GLUCAP 160* 185* 143* 133* 144*     Scheduled Meds: . atorvastatin  20 mg Oral Daily  . azaTHIOprine  100 mg Oral Daily  . diltiazem  180 mg Oral Daily  . ferrous sulfate  325 mg Oral Q breakfast  . furosemide  20 mg Oral BID  . gabapentin  600 mg Oral BID  . insulin aspart  0-5 Units Subcutaneous QHS  . insulin aspart  0-9 Units Subcutaneous TID WC  . metFORMIN  500 mg Oral BID WC  . metoprolol tartrate  12.5 mg Oral BID  . oxybutynin  5 mg Oral BID  . pantoprazole   40 mg Oral Daily  . piperacillin-tazobactam (ZOSYN)  IV  3.375 g Intravenous Q8H  . propafenone  225 mg Oral BID  . rOPINIRole  4 mg Oral QHS  . sodium chloride flush  3 mL Intravenous Q12H  . spironolactone  25 mg Oral Daily  . traZODone  200 mg Oral QHS  . venlafaxine XR  225 mg Oral Daily  . warfarin  5 mg Oral q1800  . Warfarin - Pharmacist Dosing Inpatient   Does not apply q1800   Continuous Infusions: . heparin 1,700 Units/hr (04/03/17 1240)    Assessment/Plan:  Acute blood loss anemia. Hemorrhage after oral surgery procedure to remove 4 teeth. The patient was transfused 3 units of packed red blood cells. Hemoglobin7.2. Because of fatigue or, shortness of breath ,Received 1 unit of transfusion yesterday, hemoglobin up to 8 today.  1. Metallic valve. Patient's Coumadin was reversed with 10 mg of IV vitamin K and Kcentra. It will likely take a while to get the patient's Coumadin level up again. On IV heparin and pharmacy dosing Coumadin. Daily INR checks.INR still 1.38 2. .  INR more than 4 on admission. Continue Coumadin, heparin until INR becomes more than 2.5. Discussed this with patient, patient's sister extensively, also discussed about reversing the effect of Coumadin with vitamin K, [facility] when she came and why we did that.  3. Atrial fibrillation with rapid ventricular response; heart rate improved, rate 79 bpm at this time, continue Coumadin, continue heparin, and a new Cardizem, resume her home dose Rythmol, Lasix, Aldactone at lower dose. 4. Hypomagnesemia. Replaced previously 5. Hyperlipidemia unspecified on atorvastatin 6.  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  9. Type 2 diabetes mellitus on sliding scale,restart metformin today 10.  11. Urinary incontinence on oxybutynin, now has ESBL UTI: Continue isolation, patient is on Zosyn, continue that. 12. Restless leg syndrome on Requip. Extra dose of Requip ordered if needed 13. Home health physical therapy  recommended by PT Discharge home when INR is more than 2.5 . Code Status:     Code Status Orders        Start     Ordered   03/27/17 2252  Full code  Continuous     03/27/17 2251    Code Status History  Date Active Date Inactive Code Status Order ID Comments User Context   06/26/2016  1:06 AM 06/28/2016  4:39 PM Full Code [id number]  Lance Coon, MD Inpatient   05/10/2016  4:38 PM 05/12/2016  4:59 PM Full Code [id number]  Theodoro Grist, MD ED   04/01/2016  5:13 PM 04/03/2016  5:58 AM Full Code [id number]  Henreitta Leber, MD Inpatient     Disposition Plan: Likely will be with [place] for a while since they reverse the Coumadin with 10 mg of IV vitamin K. Need to have Coumadin level therapeutic prior to going home. Patient does not want to do Lovenox injections.  Time spent: 24 minutes  [employer]

## 2017-04-04 ENCOUNTER — Inpatient Hospital Stay: Payer: Medicare Other

## 2017-04-04 LAB — HEPARIN LEVEL (UNFRACTIONATED)
HEPARIN UNFRACTIONATED: 0.25 [IU]/mL — AB (ref 0.30–0.70)
HEPARIN UNFRACTIONATED: 0.28 [IU]/mL — AB (ref 0.30–0.70)

## 2017-04-04 LAB — GLUCOSE, CAPILLARY
GLUCOSE-CAPILLARY: 129 mg/dL — AB (ref 65–99)
GLUCOSE-CAPILLARY: 111 mg/dL — AB (ref 65–99)
Glucose-Capillary: 147 mg/dL — ABNORMAL HIGH (ref 65–99)
Glucose-Capillary: 111 mg/dL — ABNORMAL HIGH (ref 65–99)

## 2017-04-04 LAB — PROTIME-INR
INR: 1.45
Prothrombin Time: 17.8 seconds — ABNORMAL HIGH (ref 11.4–15.2)

## 2017-04-04 MED ORDER — WARFARIN SODIUM 7.5 MG PO TABS
7.5000 mg | ORAL_TABLET | Freq: Once | ORAL | Status: AC
  Administered 2017-04-04: 7.5 mg via ORAL
  Filled 2017-04-04: qty 1

## 2017-04-04 MED ORDER — FUROSEMIDE 10 MG/ML IJ SOLN
80.0000 mg | Freq: Two times a day (BID) | INTRAMUSCULAR | Status: DC
Start: 2017-04-04 — End: 2017-04-08
  Administered 2017-04-04 – 2017-04-07 (×7): 80 mg via INTRAVENOUS
  Filled 2017-04-04 (×7): qty 8

## 2017-04-04 MED ORDER — HEPARIN BOLUS VIA INFUSION
1300.0000 [IU] | Freq: Once | INTRAVENOUS | Status: AC
  Administered 2017-04-04: 1300 [IU] via INTRAVENOUS
  Filled 2017-04-04: qty 1300

## 2017-04-04 MED ORDER — FUROSEMIDE 10 MG/ML IJ SOLN
60.0000 mg | Freq: Once | INTRAMUSCULAR | Status: AC
  Administered 2017-04-04: 60 mg via INTRAVENOUS
  Filled 2017-04-04: qty 6

## 2017-04-04 MED ORDER — HEPARIN BOLUS VIA INFUSION
1600.0000 [IU] | Freq: Once | INTRAVENOUS | Status: AC
  Administered 2017-04-04: 1600 [IU] via INTRAVENOUS
  Filled 2017-04-04: qty 1600

## 2017-04-04 NOTE — Progress Notes (Signed)
Per Dr. Vianne Bulls hold carizem, spironolacton, and metoprolol until lunch time and recheck BP. Give dose of IV lasix. Pt took 20mg  this am so will place order for one time dose of 60mg  IV push so that pt will be able to receive the 80mg  that has been ordered.

## 2017-04-04 NOTE — Care Management (Signed)
Barrier:  INR not therapeutic.  Anticipated discharge home with home health through [facility] with medically stable

## 2017-04-04 NOTE — Care Management Important Message (Signed)
Important Message  Patient Details  Name: Betty [name] MRN: [id number] Date of Birth: 1946/10/11   Medicare Important Message Given:  Yes    MYLENE, BOW, RN 04/04/2017, 12:17 PM

## 2017-04-04 NOTE — Progress Notes (Signed)
ANTICOAGULATION CONSULT NOTE - Follow up [place] for warfarin dosing with heparin drip bridge Indication: mechanical mitral valve/atrial fibrillation       Allergies  Allergen Reactions  . Amiodarone Other (See Comments)    Pt states that this medication causes lung bleeding.     Patient Measurements: Height: 5\' 9"  (175.3 cm) Weight: 233 lb (105.7 kg) IBW/kg (Calculated) : 66.2 Heparin Dosing Weight: 90kg  Vital Signs: Temp: 98.4 F (36.9 C) (04/10 0638) Temp Source: Oral (04/10 7829) BP: 105/51 (04/10 5621) Pulse Rate: 79 (04/10 3086)  Labs:  Recent Labs (last 2 labs)    Recent Labs  03/30/17 0548  03/30/17 2043 03/31/17 0406 03/31/17 0408 04/01/17 0534  HGB 7.5* --  --  --  8.0* 7.5*  HCT 22.6* --  --  --  24.6* --   PLT 211 --  --  --  255 --   LABPROT 13.5 --  --  13.8 --  14.4  INR 1.03 --  --  1.06 --  1.11  HEPARINUNFRC 0.28* <> 0.33 0.51 --  0.35  < > = values in this interval not displayed.    Estimated Creatinine Clearance: 81.6 mL/min (by C-G formula based on SCr of 0.83 mg/dL).   Medical History:     Past Medical History:  Diagnosis Date  . Acute diastolic heart failure ([place])   . Allergy   . 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 Asthma   . Atrial fibrillation ([place])   . Backache, unspecified   . Cardiomegaly   . COPD (chronic obstructive pulmonary disease) ([place])   . Diabetes mellitus without complication ([place])   . 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 Esophageal reflux   . Headache(784.0)   . Herpes zoster without mention of complication   . Hx: UTI (urinary tract infection)   . Hypertension    heart controlled w CHF  . Nontoxic uninodular goiter   . Obesity, unspecified   . Osteoarthrosis, unspecified whether generalized or localized, unspecified site   . Unspecified sleep apnea   . Urine incontinence    hx of     Medications:  Patient on warfarin at home 5 mg every day except Thursday, 7.5 mg on Thursdays.  Assessment: Patient admitted on 4/5 with significant bleeding from mouth s/p extraction of 4 teeth. Patient was hypotensive on admission with a Hgb of 5.9 and INR of 4.9. She received vitamin K and KCentra in ED on 4/5. Appears patient recently was taking azithromycin per med rec.  Patient was taking warfarin prior to admission for a mechanical mitral valve as well as atrial fibrillation. Once bleeding stopped, patient was restarted on warfarin with a heparin drip bridge. INR subtherapeutic post-KCentra and vitamin K. Post KCentra INR ordered per protocol was discontinued by MD.  Dosing history: DateINRDoseComments 4/54.9--KCentra and vitamin K 4/61.19; 5 mg 4/7 1.07; 7.5 mg ([place])  4/8 1.03; 5 mg  4/9 1.06; 7.5 mg 4/10 1.11; 5 mg  4/11 1.28; 5 mg 4/12:     1.38; 7.5 mg  Goal of Therapy: INR 2.5-3.5 Heparin level 0.3-0.7. Monitor platelets by anticoagulation protocol: Yes  Plan: HL = 0.40 (therapeutic). Will continue heparin drip at current rate of 1300 units/hr and order confirmatory HL in 6 hours. CBC ordered daily with AM labs.  4/7 AM heparin level 0.40. Continue current regimen. Recheck CBC and heparin level with tomorrow AM labs.  Will order warfarin 5 mg PO dose this  evening - essentially new start after reversal so expect it will take several days to see increase in INR.  4/7 1300 spoke with Dr. Earleen Newport and would like to give patient higher one time dose of warfarin since INR is trending down. Ordered warfarin 7.5 mg @ 1800 x 1 dose  4/8 AM heparin level 0.28. 1400 unit bolus and increase rate to 1500 units/hr. Recheck in 6 hours. INR 1.03. Daily warfarin dose of 5 mg reordered.  4/8: Heparin level resulted @ 0.54. Will recheck level @2100 .   4/8: HL @ 21:00 = 0.33 Will continue  this pt on current rate and recheck HL on 4/9 @ 0500.   4/9 AM heparin level 0.51. Continue current regimen. Recheck CBC and heparin level tomorrow AM. INR 1.06. Will give 7.5 mg warfarin x1 today and resume 5 mg daily tomorrow.  4/10 AM HL therapeutic. Continue current rate. Recheck CBC and HL tomorrow AM. INR 1.11. Received 7.5 mg dose yesterday. Will resume 5 mg po daily dose and recheck INR tomorrow with AM labs.   4/11 @ 0500 HL 0.24 subtherapeutic from goal. Will bolus w/ 1500 units IV x 1 and increase rate to 1700 units/hour and will recheck HL @ 1400. Recent H/H shows Hgb trending down: 8.0 - 7.5 - 7.2. May need to stop heparin if there is signs/symptoms of bleeding or Hgb continues to drop.  4/11: Heparin level resulted @ 0.36, which is therapeutic. Will continue current gtt rate. Will recheck Heparin level 22:00. Will give warfarin 5 mg PO x 1.   4/12 @ 0600 HL 0.38 therapeutic will continue current rate and will recheck HL w/ am labs. Will give warfarin 5 mg PO x 1 today.   4/13 @ 0500 HL 0.25 subtherapeutic. Will rebolus w/ 1600 units IV x 1 and increase rate to 1800 units/hr and will recheck HL @ 1300.  Thank you for this consult.  Tobie Lords, PharmD, BCPS Clinical Pharmacist 04/04/2017

## 2017-04-04 NOTE — Progress Notes (Signed)
Per Dr. Vianne Bulls okay to change pt to low sodium diet.

## 2017-04-04 NOTE — Progress Notes (Signed)
Patient c/o wheezing; bilateral wheezes noted; Respiratory therapy called for RX. Foster Sonnier K, RN6:07 AM 04/04/2017

## 2017-04-04 NOTE — Progress Notes (Signed)
ANTICOAGULATION CONSULT NOTE - Follow up [place] for warfarin dosing with heparin drip bridge Indication: mechanical mitral valve/atrial fibrillation       Allergies  Allergen Reactions  . Amiodarone Other (See Comments)    Pt states that this medication causes lung bleeding.     Patient Measurements: Height: 5\' 9"  (175.3 cm) Weight: 233 lb (105.7 kg) IBW/kg (Calculated) : 66.2 Heparin Dosing Weight: 90kg  Vital Signs: Temp: 98.4 F (36.9 C) (04/10 0638) Temp Source: Oral (04/10 6433) BP: 105/51 (04/10 2951) Pulse Rate: 79 (04/10 8841)  Labs:  Recent Labs (last 2 labs)    Recent Labs  03/30/17 0548  03/30/17 2043 03/31/17 0406 03/31/17 0408 04/01/17 0534  HGB 7.5* --  --  --  8.0* 7.5*  HCT 22.6* --  --  --  24.6* --   PLT 211 --  --  --  255 --   LABPROT 13.5 --  --  13.8 --  14.4  INR 1.03 --  --  1.06 --  1.11  HEPARINUNFRC 0.28* <> 0.33 0.51 --  0.35  < > = values in this interval not displayed.    Estimated Creatinine Clearance: 81.6 mL/min (by C-G formula based on SCr of 0.83 mg/dL).   Medical History:     Past Medical History:  Diagnosis Date  . Acute diastolic heart failure ([place])   . Allergy   . 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 Asthma   . Atrial fibrillation ([place])   . Backache, unspecified   . Cardiomegaly   . COPD (chronic obstructive pulmonary disease) ([place])   . Diabetes mellitus without complication ([place])   . 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 Esophageal reflux   . Headache(784.0)   . Herpes zoster without mention of complication   . Hx: UTI (urinary tract infection)   . Hypertension    heart controlled w CHF  . Nontoxic uninodular goiter   . Obesity, unspecified   . Osteoarthrosis, unspecified whether generalized or localized, unspecified site   . Unspecified sleep apnea   . Urine incontinence    hx of     Medications:  Patient on warfarin at home 5 mg every day except Thursday, 7.5 mg on Thursdays.  Assessment: Patient admitted on 4/5 with significant bleeding from mouth s/p extraction of 4 teeth. Patient was hypotensive on admission with a Hgb of 5.9 and INR of 4.9. She received vitamin K and KCentra in ED on 4/5. Appears patient recently was taking azithromycin per med rec.  Patient was taking warfarin prior to admission for a mechanical mitral valve as well as atrial fibrillation. Once bleeding stopped, patient was restarted on warfarin with a heparin drip bridge. INR subtherapeutic post-KCentra and vitamin K. Post KCentra INR ordered per protocol was discontinued by MD.  Dosing history: DateINRDoseComments 4/54.9--KCentra and vitamin K 4/61.19; 5 mg 4/7 1.07; 7.5 mg ([place])  4/8 1.03; 5 mg  4/9 1.06; 7.5 mg 4/10 1.11; 5 mg  4/11 1.28; 5 mg 4/12:     1.38; 5 mg 4/13:     1.45   Goal of Therapy: INR 2.5-3.5 Heparin level 0.3-0.7. Monitor platelets by anticoagulation protocol: Yes  Plan: HL = 0.40 (therapeutic). Will continue heparin drip at current rate of 1300 units/hr and order confirmatory HL in 6 hours. CBC ordered daily with AM labs.  4/7 AM heparin level 0.40. Continue current regimen. Recheck CBC and heparin level with tomorrow AM labs.  Will  order warfarin 5 mg PO dose this evening - essentially new start after reversal so expect it will take several days to see increase in INR.  4/7 1300 spoke with Dr. Earleen Newport and would like to give patient higher one time dose of warfarin since INR is trending down. Ordered warfarin 7.5 mg @ 1800 x 1 dose  4/8 AM heparin level 0.28. 1400 unit bolus and increase rate to 1500 units/hr. Recheck in 6 hours. INR 1.03. Daily warfarin dose of 5 mg reordered.  4/8: Heparin level resulted @ 0.54. Will recheck level @2100 .   4/8: HL @ 21:00 =  0.33 Will continue this pt on current rate and recheck HL on 4/9 @ 0500.   4/9 AM heparin level 0.51. Continue current regimen. Recheck CBC and heparin level tomorrow AM. INR 1.06. Will give 7.5 mg warfarin x1 today and resume 5 mg daily tomorrow.  4/10 AM HL therapeutic. Continue current rate. Recheck CBC and HL tomorrow AM. INR 1.11. Received 7.5 mg dose yesterday. Will resume 5 mg po daily dose and recheck INR tomorrow with AM labs.   4/11 @ 0500 HL 0.24 subtherapeutic from goal. Will bolus w/ 1500 units IV x 1 and increase rate to 1700 units/hour and will recheck HL @ 1400. Recent H/H shows Hgb trending down: 8.0 - 7.5 - 7.2. May need to stop heparin if there is signs/symptoms of bleeding or Hgb continues to drop.  4/11: Heparin level resulted @ 0.36, which is therapeutic. Will continue current gtt rate. Will recheck Heparin level 22:00. Will give warfarin 5 mg PO x 1.   4/12 @ 0600 HL 0.38 therapeutic will continue current rate and will recheck HL w/ am labs. Will give warfarin 5 mg PO x 1 today.   4/13 @ 0500 HL 0.25 subtherapeutic. Will rebolus w/ 1600 units IV x 1 and increase rate to 1800 units/hr and will recheck HL @ 1300.  4/13: Will give warfarin 7.5 mg PO x 1. Heparin level @ 0.28. Will give Heparin bolus of 1300 units x 1. Will increase heparin gtt rate to 2000 units/hr. Will order heparin level 0000.   Thank you for this consult.  [name], PharmD  Clinical Pharmacist 04/04/2017

## 2017-04-04 NOTE — Progress Notes (Addendum)
Patient ID: Betty [name], female   DOB: Jul 09, 1946, 71 y.o.   MRN: [id number]  [facility] PROGRESS NOTE  Betty [name]:[id number] DOB: 05-11-1946 DOA: 03/27/2017 PCP: Dion Body, MD  HPI/Subjective: Complains of fatigue but overall better. Spoke with patient's cardiologist Dr. Tammi Klippel from [facility]  He recommended IV Lasix as her dry weight is close to 101 kgs. Objective: Vitals:   04/04/17 1158 04/04/17 1250  BP: 131/60 117/64  Pulse: 79 79  Resp: 20   Temp: 98.4 F (36.9 C)     Filed Weights   04/03/17 0624 04/04/17 0500 04/04/17 1030  Weight: 108.4 kg (239 lb) 108.8 kg (239 lb 12.8 oz) 110 kg (242 lb 8 oz)    ROS: Review of Systems  Constitutional: Negative for chills and fever.  Eyes: Negative for blurred vision.  Respiratory: Negative for cough and shortness of breath.   Cardiovascular: Negative for chest pain.  Gastrointestinal: Negative for abdominal pain, constipation, diarrhea, nausea and vomiting.  Genitourinary: Negative for dysuria.  Musculoskeletal: Negative for joint pain.  Neurological: Positive for weakness. Negative for dizziness and headaches.   Exam: Physical Exam  Constitutional: She is oriented to person, place, and time.  HENT:  Nose: No mucosal edema.  Mouth/Throat: No oropharyngeal exudate or posterior oropharyngeal edema.  Eyes: Conjunctivae, EOM and lids are normal. Pupils are equal, round, and reactive to light.  Neck: No JVD present. Carotid bruit is not present. No edema present. No thyroid mass and no thyromegaly present.  Cardiovascular: S1 normal and S2 normal.  Exam reveals no gallop.   No murmur heard. Pulses:      Dorsalis pedis pulses are 2+ on the right side, and 2+ on the left side.  Respiratory: No respiratory distress. She has no wheezes. She has no rhonchi. She has no rales.  GI: Soft. Bowel sounds are normal. There is no tenderness.  Musculoskeletal:       Right ankle: She exhibits no swelling.       Left ankle:  She exhibits no swelling.  Lymphadenopathy:    She has no cervical adenopathy.  Neurological: She is alert and oriented to person, place, and time. No cranial nerve deficit.  Skin: Skin is warm. No rash noted. Nails show no clubbing.  Bruising bilateral lower face and neck  Psychiatric: She has a normal mood and affect.      Data Reviewed: Basic Metabolic Panel:  Recent Labs Lab 03/29/17 0421 04/03/17 0552  NA 134* 136  K 4.2 4.0  CL 95* 107  CO2 33* 24  GLUCOSE 141* 129*  BUN 52* 11  CREATININE 0.83 0.67  CALCIUM 7.8* 8.4*   CBC:  Recent Labs Lab 03/29/17 0421 03/30/17 0548 03/31/17 0408 04/01/17 0534 04/02/17 0511 04/02/17 1445 04/02/17 2232 04/03/17 0552  WBC 17.2* 10.6 10.9  --  9.5  --   --  9.6  HGB 6.9* 7.5* 8.0* 7.5* 7.2* 7.2* 8.0* 8.1*  HCT 20.8* 22.6* 24.6*  --  22.4* 22.5* 24.7* 25.7*  MCV 82.5 83.1 83.9  --  84.5  --   --  84.1  PLT 249 211 255  --  227  --   --  242    BNP (last 3 results)  Recent Labs  07/26/16 1159  BNP 504.0*    CBG:  Recent Labs Lab 04/03/17 1133 04/03/17 1627 04/03/17 2112 04/04/17 0804 04/04/17 1155  GLUCAP 144* 128* 127* 129* 111*     Scheduled Meds: . atorvastatin  20 mg Oral  Daily  . azaTHIOprine  100 mg Oral Daily  . diltiazem  180 mg Oral Daily  . ferrous sulfate  325 mg Oral Q breakfast  . furosemide  80 mg Intravenous BID  . gabapentin  600 mg Oral BID  . insulin aspart  0-5 Units Subcutaneous QHS  . insulin aspart  0-9 Units Subcutaneous TID WC  . metFORMIN  500 mg Oral BID WC  . metoprolol tartrate  12.5 mg Oral BID  . oxybutynin  5 mg Oral BID  . pantoprazole  40 mg Oral Daily  . piperacillin-tazobactam (ZOSYN)  IV  3.375 g Intravenous Q8H  . propafenone  225 mg Oral Q12H  . rOPINIRole  4 mg Oral QHS  . sodium chloride flush  3 mL Intravenous Q12H  . spironolactone  25 mg Oral Daily  . traZODone  200 mg Oral QHS  . venlafaxine XR  225 mg Oral Daily  . warfarin  5 mg Oral q1800  .  Warfarin - Pharmacist Dosing Inpatient   Does not apply q1800   Continuous Infusions: . heparin 1,800 Units/hr (04/04/17 1246)    Assessment/Plan:  Acute blood loss anemia. Hemorrhage after oral surgery procedure to remove 4 teeth. The patient was transfused 3 units of packed red blood cells. Hemoglobin7.2. Because of fatigue or, shortness of breath ,Received 1 unit of transfusion yesterday, hemoglobin up to 8 today.  1. Metallic valve. Patient's Coumadin was reversed with 10 mg of IV vitamin K and Kcentra. It will likely take a while to get the patient's Coumadin level up again. On IV heparin and pharmacy dosing Coumadin. Daily INR checks.INR still 1.45, continue heparin drip, Coumadin. Discussed with the patient's cardiologist. 2. .  INR more than 4 on admission. Continue Coumadin, heparin until INR becomes more than 2.5. Discussed this with patient, patient's sister extensively, also discussed about reversing the effect of Coumadin with vitamin K, [facility]. when she came and why we did that.  3. Atrial fibrillation with rapid ventricular response; heart rate improved, rate 79 bpm at this time, continue Coumadin, continue heparin, and a new Cardizem, resume her home dose Rythmol, 4. Hypomagnesemia. Replaced previously 5. Hyperlipidemia unspecified on atorvastatin 6.  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  9. Type 2 diabetes mellitus on sliding scale,restart metformin today 10.  11. Urinary incontinence on oxybutynin, now has ESBL UTI: Continue isolation, patient is on Zosyn, continue that. 12. Restless leg syndrome on Requip. Home health physical therapy recommended by PT #3 heart failure with preserved ejection fraction, mechanical mitral valve. Patient on high-dose diuretics at home including 80 MG of torsemide twice a day, patient's cardiologist Dr. Tammi Klippel he recommended starting patient on Lasix 80 mg IV twice a day because patient gained about 9 pounds, according to him and  discharge patient's weight is close to 101 pounds. Test x-ray didn't show any CHF or pneumonia today.   Discharge home when INR is more than 2.5 . Code Status:     Code Status Orders        Start     Ordered   03/27/17 2252  Full code  Continuous     03/27/17 2251    Code Status History    Date Active Date Inactive Code Status Order ID Comments User Context   06/26/2016  1:06 AM 06/28/2016  4:39 PM Full Code [id number]  Lance Coon, MD Inpatient   05/10/2016  4:38 PM 05/12/2016  4:59 PM Full Code [id number]  Theodoro Grist, MD ED  04/01/2016  5:13 PM 04/03/2016  5:58 AM Full Code [id number]  Henreitta Leber, MD Inpatient     Disposition Plan: Likely will be with [place] for a while since they reverse the Coumadin with 10 mg of IV vitamin K. Need to have Coumadin level therapeutic prior to going home. Patient does not want to do Lovenox injections.  Time spent: 24 minutes  [employer]

## 2017-04-05 LAB — BASIC METABOLIC PANEL
ANION GAP: 5 (ref 5–15)
BUN: 14 mg/dL (ref 6–20)
CO2: 27 mmol/L (ref 22–32)
Calcium: 8.6 mg/dL — ABNORMAL LOW (ref 8.9–10.3)
Chloride: 103 mmol/L (ref 101–111)
Creatinine, Ser: 0.89 mg/dL (ref 0.44–1.00)
GFR calc Af Amer: >60 mL/min (ref >60)
GLUCOSE: 119 mg/dL — AB (ref 65–99)
POTASSIUM: 4 mmol/L (ref 3.5–5.1)
Sodium: 135 mmol/L (ref 135–145)

## 2017-04-05 LAB — CBC
HCT: 25.6 % — ABNORMAL LOW (ref 35.0–47.0)
HEMOGLOBIN: 8.3 g/dL — AB (ref 12.0–16.0)
MCH: 27.3 pg (ref 26.0–34.0)
MCHC: 32.4 g/dL (ref 32.0–36.0)
MCV: 84.1 fL (ref 80.0–100.0)
Platelets: 294 10*3/uL (ref 150–440)
RBC: 3.04 MIL/uL — AB (ref 3.80–5.20)
RDW: 19.6 % — ABNORMAL HIGH (ref 11.5–14.5)
WBC: 10.2 10*3/uL (ref 3.6–11.0)

## 2017-04-05 LAB — HEPARIN LEVEL (UNFRACTIONATED)
HEPARIN UNFRACTIONATED: 0.53 [IU]/mL (ref 0.30–0.70)
Heparin Unfractionated: 0.38 IU/mL (ref 0.30–0.70)

## 2017-04-05 LAB — GLUCOSE, CAPILLARY
GLUCOSE-CAPILLARY: 117 mg/dL — AB (ref 65–99)
GLUCOSE-CAPILLARY: 116 mg/dL — AB (ref 65–99)
GLUCOSE-CAPILLARY: 100 mg/dL — AB (ref 65–99)
Glucose-Capillary: 146 mg/dL — ABNORMAL HIGH (ref 65–99)

## 2017-04-05 LAB — PROTIME-INR
INR: 1.63
Prothrombin Time: 19.5 seconds — ABNORMAL HIGH (ref 11.4–15.2)

## 2017-04-05 MED ORDER — WARFARIN SODIUM 7.5 MG PO TABS
7.5000 mg | ORAL_TABLET | Freq: Once | ORAL | Status: AC
  Administered 2017-04-05: 7.5 mg via ORAL
  Filled 2017-04-05: qty 1

## 2017-04-05 NOTE — Progress Notes (Signed)
ANTICOAGULATION CONSULT NOTE - Follow up [place] for warfarin dosing with heparin drip bridge Indication: mechanical mitral valve/atrial fibrillation       Allergies  Allergen Reactions  . Amiodarone Other (See Comments)    Pt states that this medication causes lung bleeding.     Patient Measurements: Height: 5\' 9"  (175.3 cm) Weight: 233 lb (105.7 kg) IBW/kg (Calculated) : 66.2 Heparin Dosing Weight: 90kg  Vital Signs: Temp: 98.4 F (36.9 C) (04/10 0638) Temp Source: Oral (04/10 4315) BP: 105/51 (04/10 4008) Pulse Rate: 79 (04/10 6761)  Labs:  Recent Labs (last 2 labs)    Recent Labs  03/30/17 0548  03/30/17 2043 03/31/17 0406 03/31/17 0408 04/01/17 0534  HGB 7.5* --  --  --  8.0* 7.5*  HCT 22.6* --  --  --  24.6* --   PLT 211 --  --  --  255 --   LABPROT 13.5 --  --  13.8 --  14.4  INR 1.03 --  --  1.06 --  1.11  HEPARINUNFRC 0.28* <> 0.33 0.51 --  0.35  < > = values in this interval not displayed.    Estimated Creatinine Clearance: 81.6 mL/min (by C-G formula based on SCr of 0.83 mg/dL).   Medical History:     Past Medical History:  Diagnosis Date  . Acute diastolic heart failure ([place])   . Allergy   . 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 Asthma   . Atrial fibrillation ([place])   . Backache, unspecified   . Cardiomegaly   . COPD (chronic obstructive pulmonary disease) ([place])   . Diabetes mellitus without complication ([place])   . 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 Esophageal reflux   . Headache(784.0)   . Herpes zoster without mention of complication   . Hx: UTI (urinary tract infection)   . Hypertension    heart controlled w CHF  . Nontoxic uninodular goiter   . Obesity, unspecified   . Osteoarthrosis, unspecified whether generalized or localized, unspecified site   . Unspecified sleep apnea   . Urine incontinence    hx of     Medications:  Patient on warfarin at home 5 mg every day except Thursday, 7.5 mg on Thursdays.  Assessment: Patient admitted on 4/5 with significant bleeding from mouth s/p extraction of 4 teeth. Patient was hypotensive on admission with a Hgb of 5.9 and INR of 4.9. She received vitamin K and KCentra in ED on 4/5. Appears patient recently was taking azithromycin per med rec.  Patient was taking warfarin prior to admission for a mechanical mitral valve as well as atrial fibrillation. Once bleeding stopped, patient was restarted on warfarin with a heparin drip bridge. INR subtherapeutic post-KCentra and vitamin K. Post KCentra INR ordered per protocol was discontinued by MD.  Dosing history: DateINRDoseComments 4/54.9-- KCentra and vitamin K 4/61.19;   5 mg 4/7 1.07;   7.5 mg ([place])  4/8 1.03;   5 mg  4/9 1.06;   7.5 mg 4/10 1.11;   5 mg  4/11 1.28;   5 mg 4/12:     1.38;  5 mg 4/13:     1.45   7.5 mg 4/14      1.63   Goal of Therapy: INR 2.5-3.5 Heparin level 0.3-0.7. Monitor platelets by anticoagulation protocol: Yes  Plan: HL = 0.40 (therapeutic). Will continue heparin drip at current rate of 1300 units/hr and order confirmatory HL in 6 hours. CBC  ordered daily with AM labs.  4/7 AM heparin level 0.40. Continue current regimen. Recheck CBC and heparin level with tomorrow AM labs.  Will order warfarin 5 mg PO dose this evening - essentially new start after reversal so expect it will take several days to see increase in INR.  4/7 1300 spoke with Dr. Earleen Newport and would like to give patient higher one time dose of warfarin since INR is trending down. Ordered warfarin 7.5 mg @ 1800 x 1 dose  4/8 AM heparin level 0.28. 1400 unit bolus and increase rate to 1500 units/hr. Recheck in 6 hours. INR 1.03. Daily warfarin dose of 5 mg reordered.  4/8: Heparin level resulted @ 0.54. Will recheck  level @2100 .   4/8: HL @ 21:00 = 0.33 Will continue this pt on current rate and recheck HL on 4/9 @ 0500.   4/9 AM heparin level 0.51. Continue current regimen. Recheck CBC and heparin level tomorrow AM. INR 1.06. Will give 7.5 mg warfarin x1 today and resume 5 mg daily tomorrow.  4/10 AM HL therapeutic. Continue current rate. Recheck CBC and HL tomorrow AM. INR 1.11. Received 7.5 mg dose yesterday. Will resume 5 mg po daily dose and recheck INR tomorrow with AM labs.   4/11 @ 0500 HL 0.24 subtherapeutic from goal. Will bolus w/ 1500 units IV x 1 and increase rate to 1700 units/hour and will recheck HL @ 1400. Recent H/H shows Hgb trending down: 8.0 - 7.5 - 7.2. May need to stop heparin if there is signs/symptoms of bleeding or Hgb continues to drop.  4/11: Heparin level resulted @ 0.36, which is therapeutic. Will continue current gtt rate. Will recheck Heparin level 22:00. Will give warfarin 5 mg PO x 1.   4/12 @ 0600 HL 0.38 therapeutic will continue current rate and will recheck HL w/ am labs. Will give warfarin 5 mg PO x 1 today.   4/13 @ 0500 HL 0.25 subtherapeutic. Will rebolus w/ 1600 units IV x 1 and increase rate to 1800 units/hr and will recheck HL @ 1300.  4/13: Will give warfarin 7.5 mg PO x 1. Heparin level @ 0.28. Will give Heparin bolus of 1300 units x 1. Will increase heparin gtt rate to 2000 units/hr. Will order heparin level 0000.   4/14: Heparin: HL@0003 = 0.38, HL@0738 =0.53. Will continue current rate of 2000 units/hr and recheck with am labs. Warfarin: INR= 1.63. Will give Warfarin 7.5 mg po x 1 again today. F/u INR in am.  Thank you for this consult.  Chinita Greenland PharmD Clinical Pharmacist 04/05/2017

## 2017-04-05 NOTE — Progress Notes (Signed)
Patient ID: Betty [name], female   DOB: Jan 25, 1946, 71 y.o.   MRN: [id number]  [facility] PROGRESS NOTE  Betty [name] UGQ:[id number] DOB: 1946/10/18 DOA: 03/27/2017 PCP: Dion Body, MD  HPI/Subjective: denies  shortness of breath.  weight down from 242 pounds to237 pounds this morning. Objective: Vitals:   04/04/17 2143 04/05/17 0524  BP: 104/60 (!) 111/55  Pulse:  72  Resp:  19  Temp:  98.4 F (36.9 C)    Filed Weights   04/04/17 1030 04/04/17 2148 04/05/17 0500  Weight: 110 kg (242 lb 8 oz) 107.9 kg (237 lb 12.8 oz) 107.5 kg (237 lb 1.6 oz)    ROS: Review of Systems  Constitutional: Negative for chills and fever.  Eyes: Negative for blurred vision.  Respiratory: Negative for cough and shortness of breath.   Cardiovascular: Negative for chest pain.  Gastrointestinal: Negative for abdominal pain, constipation, diarrhea, nausea and vomiting.  Genitourinary: Negative for dysuria.  Musculoskeletal: Negative for joint pain.  Neurological: Positive for weakness. Negative for dizziness and headaches.   Exam: Physical Exam  Constitutional: She is oriented to person, place, and time.  HENT:  Nose: No mucosal edema.  Mouth/Throat: No oropharyngeal exudate or posterior oropharyngeal edema.  Eyes: Conjunctivae, EOM and lids are normal. Pupils are equal, round, and reactive to light.  Neck: No JVD present. Carotid bruit is not present. No edema present. No thyroid mass and no thyromegaly present.  Cardiovascular: S1 normal and S2 normal.  Exam reveals no gallop.   No murmur heard. Pulses:      Dorsalis pedis pulses are 2+ on the right side, and 2+ on the left side.  Respiratory: No respiratory distress. She has no wheezes. She has no rhonchi. She has no rales.  GI: Soft. Bowel sounds are normal. There is no tenderness.  Musculoskeletal:       Right ankle: She exhibits no swelling.       Left ankle: She exhibits no swelling.  Lymphadenopathy:    She has no cervical  adenopathy.  Neurological: She is alert and oriented to person, place, and time. No cranial nerve deficit.  Skin: Skin is warm. No rash noted. Nails show no clubbing.  Bruising bilateral lower face and neck  Psychiatric: She has a normal mood and affect.      Data Reviewed: Basic Metabolic Panel:  Recent Labs Lab 04/03/17 0552 04/05/17 0440  NA 136 135  K 4.0 4.0  CL 107 103  CO2 24 27  GLUCOSE 129* 119*  BUN 11 14  CREATININE 0.67 0.89  CALCIUM 8.4* 8.6*   CBC:  Recent Labs Lab 03/30/17 0548 03/31/17 0408  04/02/17 0511 04/02/17 1445 04/02/17 2232 04/03/17 0552 04/05/17 0738  WBC 10.6 10.9  --  9.5  --   --  9.6 10.2  HGB 7.5* 8.0*  < > 7.2* 7.2* 8.0* 8.1* 8.3*  HCT 22.6* 24.6*  --  22.4* 22.5* 24.7* 25.7* 25.6*  MCV 83.1 83.9  --  84.5  --   --  84.1 84.1  PLT 211 255  --  227  --   --  242 294  < > = values in this interval not displayed.  BNP (last 3 results)  Recent Labs  07/26/16 1159  BNP 504.0*    CBG:  Recent Labs Lab 04/04/17 0804 04/04/17 1155 04/04/17 1711 04/04/17 2102 04/05/17 0738  GLUCAP 129* 111* 147* 111* 117*     Scheduled Meds: . atorvastatin  20 mg Oral Daily  .  azaTHIOprine  100 mg Oral Daily  . diltiazem  180 mg Oral Daily  . ferrous sulfate  325 mg Oral Q breakfast  . furosemide  80 mg Intravenous BID  . gabapentin  600 mg Oral BID  . insulin aspart  0-5 Units Subcutaneous QHS  . insulin aspart  0-9 Units Subcutaneous TID WC  . metFORMIN  500 mg Oral BID WC  . metoprolol tartrate  12.5 mg Oral BID  . oxybutynin  5 mg Oral BID  . pantoprazole  40 mg Oral Daily  . piperacillin-tazobactam (ZOSYN)  IV  3.375 g Intravenous Q8H  . propafenone  225 mg Oral Q12H  . rOPINIRole  4 mg Oral QHS  . sodium chloride flush  3 mL Intravenous Q12H  . spironolactone  25 mg Oral Daily  . traZODone  200 mg Oral QHS  . venlafaxine XR  225 mg Oral Daily  . Warfarin - Pharmacist Dosing Inpatient   Does not apply q1800   Continuous  Infusions: . heparin 2,000 Units/hr (04/05/17 0817)    Assessment/Plan:  Acute blood loss anemia. Hemorrhage after oral surgery procedure to remove 4 teeth. Hypotension received total of 4 units of blood transfusion during this hospitalization so far. Hemoglobin stable at 8.3.   1. Metallic valve. Patient's Coumadin was reversed with 10 mg of IV vitamin K and Kcentra. It will likely take a while to get the patient's Coumadin level up again. On IV heparin and pharmacy dosing Coumadin. Daily INR checks.INR still 1.63. continue heparin drip, Coumadin. Discussed with the patient's cardiologist. 2. .  INR more than 4 on admission. Continue Coumadin, heparin until INR becomes more than 2.5. Discussed this with patient, patient's sister extensively, also discussed about reversing the effect of Coumadin with vitamin K, [facility]tr. when she came and why we did that.  3. Atrial fibrillation with rapid ventricular response; heart rate improved, rate 79 bpm at this time, continue Coumadin, continue heparin, and a new Cardizem, resume her home dose Rythmol, 4. Hypomagnesemia. Replaced previously 5. Hyperlipidemia unspecified on atorvastatin 6.  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  9. Type 2 diabetes mellitus on sliding scale,restarted metformin , blood sugars controlled. 10.  11. Urinary incontinence on oxybutynin, now has ESBL UTI: Continue isolation, patient is on Zosyn, continue that. Continue isolation. 12. Restless leg syndrome on Requip. Home health physical therapy recommended by PT #3 heart failure with preserved ejection fraction, mechanical mitral valve. Patient on high-dose diuretics at home including 80 MG of torsemide twice a day, patient's cardiologist Dr. Tammi Klippel he recommended starting patient on Lasix 80 mg IV twice a day because patient gained about 9 pounds, according to him and discharge patient's weight is close to 101 kgs. Patient feels better, BP better, kidney function  better so continue IV Lasix today also,.DR.Abraham ' pts cardiologist cell phone number [phone]. Chest xray showed no pneumonia.Marland Kitchen   Discharge home when INR is more than 2.5 . Code Status:     Code Status Orders        Start     Ordered   03/27/17 2252  Full code  Continuous     03/27/17 2251    Code Status History    Date Active Date Inactive Code Status Order ID Comments User Context   06/26/2016  1:06 AM 06/28/2016  4:39 PM Full Code [id number]  Lance Coon, MD Inpatient   05/10/2016  4:38 PM 05/12/2016  4:59 PM Full Code [id number]  Theodoro Grist, MD ED  04/01/2016  5:13 PM 04/03/2016  5:58 AM Full Code [id number]  Henreitta Leber, MD Inpatient     Disposition Plan: Likely will be with [place] for a while since they reverse the Coumadin with 10 mg of IV vitamin K. Need to have Coumadin level therapeutic prior to going home. Patient does not want to do Lovenox injections.  Time spent: 24 minutes  [facility]

## 2017-04-06 ENCOUNTER — Inpatient Hospital Stay: Payer: Medicare Other

## 2017-04-06 LAB — CBC
HCT: 24.7 % — ABNORMAL LOW (ref 35.0–47.0)
HEMOGLOBIN: 7.9 g/dL — AB (ref 12.0–16.0)
MCH: 26.5 pg (ref 26.0–34.0)
MCHC: 31.9 g/dL — ABNORMAL LOW (ref 32.0–36.0)
MCV: 83 fL (ref 80.0–100.0)
PLATELETS: 299 10*3/uL (ref 150–440)
RBC: 2.98 MIL/uL — AB (ref 3.80–5.20)
RDW: 19.3 % — ABNORMAL HIGH (ref 11.5–14.5)
WBC: 11 10*3/uL (ref 3.6–11.0)

## 2017-04-06 LAB — GLUCOSE, CAPILLARY
GLUCOSE-CAPILLARY: 116 mg/dL — AB (ref 65–99)
GLUCOSE-CAPILLARY: 136 mg/dL — AB (ref 65–99)
GLUCOSE-CAPILLARY: 96 mg/dL (ref 65–99)
Glucose-Capillary: 128 mg/dL — ABNORMAL HIGH (ref 65–99)

## 2017-04-06 LAB — PROTIME-INR
INR: 1.84
Prothrombin Time: 21.5 seconds — ABNORMAL HIGH (ref 11.4–15.2)

## 2017-04-06 LAB — HEPARIN LEVEL (UNFRACTIONATED): HEPARIN UNFRACTIONATED: 0.54 [IU]/mL (ref 0.30–0.70)

## 2017-04-06 MED ORDER — WARFARIN SODIUM 7.5 MG PO TABS
7.5000 mg | ORAL_TABLET | Freq: Every day | ORAL | Status: DC
  Administered 2017-04-06 – 2017-04-07 (×2): 7.5 mg via ORAL
  Filled 2017-04-06 (×2): qty 1

## 2017-04-06 NOTE — Progress Notes (Signed)
ANTICOAGULATION CONSULT NOTE - Follow up Betty [name] for warfarin dosing with heparin drip bridge Indication: mechanical mitral valve/atrial fibrillation       Allergies  Allergen Reactions  . Amiodarone Other (See Comments)    Pt states that this medication causes lung bleeding.     Patient Measurements: Height: 5\' 9"  (175.3 cm) Weight: 233 lb (105.7 kg) IBW/kg (Calculated) : 66.2 Heparin Dosing Weight: 90kg  Vital Signs: Temp: 98.4 F (36.9 C) (04/10 0638) Temp Source: Oral (04/10 5284) BP: 105/51 (04/10 1324) Pulse Rate: 79 (04/10 4010)  Labs:  Recent Labs (last 2 labs)    Recent Labs  03/30/17 0548  03/30/17 2043 03/31/17 0406 03/31/17 0408 04/01/17 0534  HGB 7.5* --  --  --  8.0* 7.5*  HCT 22.6* --  --  --  24.6* --   PLT 211 --  --  --  255 --   LABPROT 13.5 --  --  13.8 --  14.4  INR 1.03 --  --  1.06 --  1.11  HEPARINUNFRC 0.28* <> 0.33 0.51 --  0.35  < > = values in this interval not displayed.    Estimated Creatinine Clearance: 81.6 mL/min (by C-G formula based on SCr of 0.83 mg/dL).   Medical History:     Past Medical History:  Diagnosis Date  . Acute diastolic heart failure ([place])   . Allergy   . 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 Asthma   . Atrial fibrillation ([place])   . Backache, unspecified   . Cardiomegaly   . COPD (chronic obstructive pulmonary disease) ([place])   . Diabetes mellitus without complication ([place])   . 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 Esophageal reflux   . Headache(784.0)   . Herpes zoster without mention of complication   . Hx: UTI (urinary tract infection)   . Hypertension    heart controlled w CHF  . Nontoxic uninodular goiter   . Obesity, unspecified   . Osteoarthrosis, unspecified whether generalized or localized, unspecified site   . Unspecified sleep apnea   . Urine incontinence    hx of     Medications:  Patient on warfarin at home 5 mg every day except Thursday, 7.5 mg on Thursdays.  Assessment: Patient admitted on 4/5 with significant bleeding from mouth s/p extraction of 4 teeth. Patient was hypotensive on admission with a Hgb of 5.9 and INR of 4.9. She received vitamin K and KCentra in ED on 4/5. Appears patient recently was taking azithromycin per med rec.  Patient was taking warfarin prior to admission for a mechanical mitral valve as well as atrial fibrillation. Once bleeding stopped, patient was restarted on warfarin with a heparin drip bridge. INR subtherapeutic post-KCentra and vitamin K. Post KCentra INR ordered per protocol was discontinued by MD.  Dosing history: DateINRDoseComments 4/54.9-- KCentra and vitamin K 4/61.19;   5 mg 4/7 1.07;   7.5 mg ([place])  4/8 1.03;   5 mg  4/9 1.06;   7.5 mg 4/10 1.11;   5 mg  4/11 1.28;   5 mg 4/12: 1.38;  5 mg 4/13:     1.45   7.5 mg 4/14      1.63          Goal of Therapy: INR 2.5-3.5 Heparin level 0.3-0.7. Monitor platelets by anticoagulation protocol: Yes  Plan: HL = 0.40 (therapeutic). Will continue heparin drip at current rate of 1300 units/hr and order confirmatory HL in  6 hours. CBC ordered daily with AM labs.  4/7 AM heparin level 0.40. Continue current regimen. Recheck CBC and heparin level with tomorrow AM labs.  Will order warfarin 5 mg PO dose this evening - essentially new start after reversal so expect it will take several days to see increase in INR.  4/7 1300 spoke with Dr. Earleen Newport and would like to give patient higher one time dose of warfarin since INR is trending down. Ordered warfarin 7.5 mg @ 1800 x 1 dose  4/8 AM heparin level 0.28. 1400 unit bolus and increase rate to 1500 units/hr. Recheck in 6 hours. INR 1.03. Daily warfarin dose of 5 mg reordered.  4/8: Heparin level resulted @ 0.54. Will  recheck level @2100 .   4/8: HL @ 21:00 = 0.33 Will continue this pt on current rate and recheck HL on 4/9 @ 0500.   4/9 AM heparin level 0.51. Continue current regimen. Recheck CBC and heparin level tomorrow AM. INR 1.06. Will give 7.5 mg warfarin x1 today and resume 5 mg daily tomorrow.  4/10 AM HL therapeutic. Continue current rate. Recheck CBC and HL tomorrow AM. INR 1.11. Received 7.5 mg dose yesterday. Will resume 5 mg po daily dose and recheck INR tomorrow with AM labs.   4/11 @ 0500 HL 0.24 subtherapeutic from goal. Will bolus w/ 1500 units IV x 1 and increase rate to 1700 units/hour and will recheck HL @ 1400. Recent H/H shows Hgb trending down: 8.0 - 7.5 - 7.2. May need to stop heparin if there is signs/symptoms of bleeding or Hgb continues to drop.  4/11: Heparin level resulted @ 0.36, which is therapeutic. Will continue current gtt rate. Will recheck Heparin level 22:00. Will give warfarin 5 mg PO x 1.   4/12 @ 0600 HL 0.38 therapeutic will continue current rate and will recheck HL w/ am labs. Will give warfarin 5mg  PO x 1 today.   4/13 @ 0500 HL 0.25 subtherapeutic. Will rebolus w/ 1600 units IV x 1 and increase rate to 1800 units/hr and will recheck HL @ 1300.  4/13: Will give warfarin 7.5 mg PO x 1. Heparin level @ 0.28. Will give Heparin bolus of 1300 units x 1. Will increase heparin gtt rate to 2000 units/hr. Will order heparin level 0000.   4/14: Heparin: HL@0003 = 0.38, HL@0738 =0.53. Will continue current rate of 2000 units/hr and recheck with am labs. Warfarin: INR= 1.63. Will give Warfarin 7.5 mg po x 1 again today. F/u INR in am.  4/15 @ 0400 HL: 0.54 therapeutic will continue current rate and recheck w/ am labs.  Thank you for this consult.  Tobie Lords, PharmD, BCPS Clinical Pharmacist 04/06/2017

## 2017-04-06 NOTE — Progress Notes (Signed)
At approximately 0545 patient was found on the floor sitting up. Pt was awake, alert, and oriented x4 when found. Pt stated that she was trying to close her door, turned around, and got "mixed up" and the next thing she remembers is being on the ground. She stated that she was sitting on the ground "just a minute" before we found her. Vitals were taken. Pt stated that her left wrist was hurting - pain level 5/10. She stated that her elbow was slightly sore - pain level 4/10. She stated that the left side of her neck/shoulder was sore - pain level 5/10. Skin assessed - no bleeding, redness, bruising, other areas of tenderness, or lacerations noted - only an abrasion to the left elbow. Pt was helped up to her feet and she then requested to use the BR. Pt helped to [facility] and then transferred to the bed. Neuro assessment and ROM completed and unremarkable. Vitals were taken again with BP improvement. Explained to patient that MD would be notified and patient became anxious stating "please don't call the doctor." Explained to her that it was for her safety that we needed to provided them with updates on what happened. Provided comfort that no one was angry or mad at her and we needed to do all we could to provide the best outcome for her - pt seemed to calm down after explanation. Asked if she wanted any of her family members called and notified - she refused and did not want anyone called. MD was paged at approximately 0555 on 4/15. Dr. Estanislado Pandy returned page and notified of fall. Discussed recent vitals, heparin gtt, and patient's complaints of pain - reviewed last INR. MD gave orders for q4 hr neuro checks x 24 hours and a STAT CT head w/o contrast. No other orders. Pt remains alert and oriented x4; VSS; no changes in LOC at this time. Pt was provided High Fall Risk education. Report given to coming RN.

## 2017-04-06 NOTE — Progress Notes (Signed)
Patient ID: Betty [name], female   DOB: 1946-09-05, 71 y.o.   MRN: [id number]  [employer] PROGRESS NOTE  Betty [name] EQA:[id number] DOB: 1946/07/26 DOA: 03/27/2017 PCP: Dion Body, MD  HPI/Subjective:  Had a fall this morning, complains of left shoulder pain. Did not hit her head.  No shortness of breath or chest pain. Objective: Vitals:   04/06/17 0959 04/06/17 1240  BP: 95/62 (!) 131/52  Pulse: 75 85  Resp:    Temp:  99 F (37.2 C)    Filed Weights   04/04/17 2148 04/05/17 0500 04/06/17 0340  Weight: 107.9 kg (237 lb 12.8 oz) 107.5 kg (237 lb 1.6 oz) 106.8 kg (235 lb 8 oz)    ROS: Review of Systems  Constitutional: Negative for chills and fever.  Eyes: Negative for blurred vision.  Respiratory: Negative for cough and shortness of breath.   Cardiovascular: Negative for chest pain.  Gastrointestinal: Negative for abdominal pain, constipation, diarrhea, nausea and vomiting.  Genitourinary: Negative for dysuria.  Musculoskeletal: Negative for joint pain.  Neurological: Positive for weakness. Negative for dizziness and headaches.   Exam: Physical Exam  Constitutional: She is oriented to person, place, and time.  HENT:  Nose: No mucosal edema.  Mouth/Throat: No oropharyngeal exudate or posterior oropharyngeal edema.  Eyes: Conjunctivae, EOM and lids are normal. Pupils are equal, round, and reactive to light.  Neck: No JVD present. Carotid bruit is not present. No edema present. No thyroid mass and no thyromegaly present.  Cardiovascular: S1 normal and S2 normal.  Exam reveals no gallop.   No murmur heard. Pulses:      Dorsalis pedis pulses are 2+ on the right side, and 2+ on the left side.  Respiratory: No respiratory distress. She has no wheezes. She has no rhonchi. She has no rales.  GI: Soft. Bowel sounds are normal. There is no tenderness.  Musculoskeletal:       Right ankle: She exhibits no swelling.       Left ankle: She exhibits no swelling.   Lymphadenopathy:    She has no cervical adenopathy.  Neurological: She is alert and oriented to person, place, and time. No cranial nerve deficit.  Skin: Skin is warm. No rash noted. Nails show no clubbing.  Bruising bilateral lower face and neck  Psychiatric: She has a normal mood and affect.      Data Reviewed: Basic Metabolic Panel:  Recent Labs Lab 04/03/17 0552 04/05/17 0440  NA 136 135  K 4.0 4.0  CL 107 103  CO2 24 27  GLUCOSE 129* 119*  BUN 11 14  CREATININE 0.67 0.89  CALCIUM 8.4* 8.6*   CBC:  Recent Labs Lab 03/31/17 0408  04/02/17 0511 04/02/17 1445 04/02/17 2232 04/03/17 0552 04/05/17 0738 04/06/17 0404  WBC 10.9  --  9.5  --   --  9.6 10.2 11.0  HGB 8.0*  < > 7.2* 7.2* 8.0* 8.1* 8.3* 7.9*  HCT 24.6*  --  22.4* 22.5* 24.7* 25.7* 25.6* 24.7*  MCV 83.9  --  84.5  --   --  84.1 84.1 83.0  PLT 255  --  227  --   --  242 294 299  < > = values in this interval not displayed.  BNP (last 3 results)  Recent Labs  07/26/16 1159  BNP 504.0*    CBG:  Recent Labs Lab 04/05/17 1134 04/05/17 1707 04/05/17 2141 04/06/17 0756 04/06/17 1140  GLUCAP 116* 146* 100* 116* 128*  Scheduled Meds: . atorvastatin  20 mg Oral Daily  . azaTHIOprine  100 mg Oral Daily  . diltiazem  180 mg Oral Daily  . ferrous sulfate  325 mg Oral Q breakfast  . furosemide  80 mg Intravenous BID  . gabapentin  600 mg Oral BID  . insulin aspart  0-5 Units Subcutaneous QHS  . insulin aspart  0-9 Units Subcutaneous TID WC  . metFORMIN  500 mg Oral BID WC  . metoprolol tartrate  12.5 mg Oral BID  . oxybutynin  5 mg Oral BID  . pantoprazole  40 mg Oral Daily  . piperacillin-tazobactam (ZOSYN)  IV  3.375 g Intravenous Q8H  . propafenone  225 mg Oral Q12H  . rOPINIRole  4 mg Oral QHS  . sodium chloride flush  3 mL Intravenous Q12H  . spironolactone  25 mg Oral Daily  . traZODone  200 mg Oral QHS  . venlafaxine XR  225 mg Oral Daily  . warfarin  7.5 mg Oral q1800  .  Warfarin - Pharmacist Dosing Inpatient   Does not apply q1800   Continuous Infusions: . heparin 2,000 Units/hr (04/05/17 2255)    Assessment/Plan:  Acute blood loss anemia. Hemorrhage after oral surgery procedure to remove 4 teeth. Hypotension received total of 4 units of blood transfusion during this hospitalization so far. Hemoglobin stable at 7.9. Only slight drop in hemoglobin but overall stable, we will not transfuse at this time.   1. Metallic valve. Patient's Coumadin was reversed with 10 mg of IV vitamin K and Kcentra. It will likely take a while to get the patient's Coumadin level up again. On IV heparin and pharmacy dosing Coumadin. Daily INR checks.INR still 1.84.. continue heparin drip, Coumadin. Discussed with the patient's cardiologist. 2. .  INR more than 4 on admission. Continue Coumadin, heparin until INR becomes more than 2.5. Discussed this with patient, patient's sister extensively, also discussed about reversing the effect of Coumadin with vitamin K, [facility] when she came and why we did that.  3. Atrial fibrillation with rapid ventricular response; heart rate improved, rate 79 bpm at this time, continue Coumadin, continue heparin, and  continue Cardizem, resume her home dose Rythmol, 4. Hypomagnesemia. Replaced previously 5. Hyperlipidemia unspecified on atorvastatin 6.  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  9. Type 2 diabetes mellitus on sliding scale,restarted metformin , blood sugars controlled. 10.  11. Urinary incontinence on oxybutynin, now has ESBL UTI: Continue isolation, patient is on Zosyn, continue that. Continue isolation. 12. Restless leg syndrome on Requip. Home health physical therapy recommended by PT #3 heart failure with preserved ejection fraction, mechanical mitral valve. Patient on high-dose diuretics at home including 80 MG of torsemide twice a day, patient's cardiologist Dr. Tammi Klippel he recommended starting patient on Lasix 80 mg IV  twice a day because patient gained about 9 pounds, according to him and discharge patient's weight is close to 101 kgs. Patient feels better, BP better, kidney function better so continue IV Lasix today also,.DR.Abraham ' pts cardiologist cell phone number [phone]. Chest xray showed no pneumonia.. 13. History of fall: CT head negative for any hemorrhage or intracranial bleed. Left shoulder pain, x-ray of the left shoulder is done. Continue pain medicines, follow the x-ray results,  Discharge home when INR is more than 2.5 . Code Status:     Code Status Orders        Start     Ordered   03/27/17 2252  Full code  Continuous  03/27/17 2251    Code Status History    Date Active Date Inactive Code Status Order ID Comments User Context   06/26/2016  1:06 AM 06/28/2016  4:39 PM Full Code [id number]  Lance Coon, MD Inpatient   05/10/2016  4:38 PM 05/12/2016  4:59 PM Full Code [id number]  Theodoro Grist, MD ED   04/01/2016  5:13 PM 04/03/2016  5:58 AM Full Code [id number]  Henreitta Leber, MD Inpatient     Disposition Plan: Likely will be with [place] for a while since they reverse the Coumadin with 10 mg of IV vitamin K. Need to have Coumadin level therapeutic prior to going home. Patient does not want to do Lovenox injections.  Time spent: 24 minutes  [employer]

## 2017-04-06 NOTE — Progress Notes (Signed)
ANTICOAGULATION CONSULT NOTE - Follow Up Consult  Pharmacy Consult for Warfarin dosing with Heparin drip bridge Indication: mechanical mitral valve/atrial fibrillation  Allergies  Allergen Reactions  . Amiodarone Other (See Comments)    Pt states that this medication causes lung bleeding.      Patient Measurements: Height: 5\' 9"  (175.3 cm) Weight: 235 lb 8 oz (106.8 kg) IBW/kg (Calculated) : 66.2 Heparin Dosing Weight: 90 kg  Vital Signs: Temp: 98.6 F (37 C) (04/15 0611) Temp Source: Oral (04/15 0611) BP: 95/62 (04/15 0959) Pulse Rate: 75 (04/15 0959)  Labs:  Recent Labs  04/04/17 0509  04/05/17 0003 04/05/17 0440 04/05/17 0738 04/06/17 0404  HGB  --   --   --   --  8.3* 7.9*  HCT  --   --   --   --  25.6* 24.7*  PLT  --   --   --   --  294 299  LABPROT 17.8*  --   --  19.5*  --  21.5*  INR 1.45  --   --  1.63  --  1.84  HEPARINUNFRC 0.25*  < > 0.38  --  0.53 0.54  CREATININE  --   --   --  0.89  --   --   < > = values in this interval not displayed.  Estimated Creatinine Clearance: 76.5 mL/min (by C-G formula based on SCr of 0.89 mg/dL).   Medications:  Patient on warfarin at home 5 mg every day except Thursday, 7.5 mg on Thursdays.   Assessment: Patient admitted on 4/5 with significant bleeding from mouth s/p extraction of 4 teeth. Patient was hypotensive on admission with a Hgb of 5.9 and INR of 4.9. She received vitamin K and KCentra in ED on 4/5. Appears patient recently was taking azithromycin per med rec.  Patient was taking warfarin prior to admission for a mechanical mitral valve as well as atrial fibrillation. Once bleeding stopped, patient was restarted on warfarin with a heparin drip bridge. INR subtherapeutic post-KCentra and vitamin K. Post KCentra INR ordered per protocol was discontinued by MD.  Dosing history: DateINRDoseComments 4/54.9-- KCentra and vitamin K 4/61.19;  5 mg 4/7 1.07;   7.5 mg ([place])  4/8 1.03; 5 mg  4/9 1.06;  7.5 mg 4/10 1.11;  5 mg  4/11 1.28;  5 mg 4/12: 1.38;  5 mg 4/13: 1.45  7.5 mg 4/14 1.637.5 mg 4/15 1.84    Goal of Therapy: INR 2.5-3.5 Heparin level 0.3-0.7. Monitor platelets by anticoagulation protocol: Yes  Plan: HL = 0.40 (therapeutic). Will continue heparin drip at current rate of 1300 units/hr and order confirmatory HL in 6 hours. CBC ordered daily with AM labs.  4/7 AM heparin level 0.40. Continue current regimen. Recheck CBC and heparin level with tomorrow AM labs.  Will order warfarin 5 mg PO dose this evening - essentially new start after reversal so expect it will take several days to see increase in INR.  4/7 1300 spoke with Dr. Earleen Newport and would like to give patient higher one time dose of warfarin since INR is trending down. Ordered warfarin 7.5 mg @ 1800 x 1 dose  4/8 AM heparin level 0.28. 1400 unit bolus and increase rate to 1500 units/hr. Recheck in 6 hours. INR 1.03. Daily warfarin dose of 5 mg reordered.  4/8: Heparin level resulted @ 0.54. Will recheck level @2100 .   4/8: HL @ 21:00 = 0.33 Will continue this pt on current rate and recheck HL on  4/9 @ 0500.   4/9 AM heparin level 0.51. Continue current regimen. Recheck CBC and heparin level tomorrow AM. INR 1.06. Will give 7.5 mg warfarin x1 today and resume 5 mg daily tomorrow.  4/10 AM HL therapeutic. Continue current rate. Recheck CBC and HL tomorrow AM. INR 1.11. Received 7.5 mg dose yesterday. Will resume 5 mg po daily dose and recheck INR tomorrow with AM labs.   4/11 @ 0500 HL 0.24 subtherapeutic from goal. Will bolus w/ 1500 units IV x 1 and increase rate to 1700 units/hour and will recheck HL @ 1400. Recent H/H shows Hgb trending down: 8.0 - 7.5 - 7.2. May need to stop heparin if there is signs/symptoms of bleeding or Hgb continues to drop.  4/11: Heparin level resulted @ 0.36, which is  therapeutic. Will continue current gtt rate. Will recheck Heparin level 22:00. Will give warfarin 5 mg PO x 1.   4/12 @ 0600 HL 0.38 therapeutic will continue current rate and will recheck HL w/ am labs. Will give warfarin 5mg  PO x 1 today.   4/13 @ 0500 HL 0.25 subtherapeutic. Will rebolus w/ 1600 units IV x 1 and increase rate to 1800 units/hr and will recheck HL @ 1300.  4/13: Will give warfarin 7.5 mg PO x 1. Heparin level @ 0.28. Will give Heparin bolus of 1300 units x 1. Will increase heparin gtt rate to 2000 units/hr. Will order heparin level 0000.   4/14: Heparin:HL@0003 = 0.38, HL@0738 =0.53. Will continue current rate of 2000 units/hr and recheck with am labs. Warfarin:INR= 1.63. Will give Warfarin 7.5 mg po x 1 again today. F/u INR in am.  4/15 Heparin Drip: @ 0400 HL: 0.54 therapeutic will continue current rate and recheck w/ am labs. Warfarin: INR= 1.84. Will order Warfarin 7.5 mg po daily. f/u INR in am. (mechanical mitral valve goal INR 2.5-3.5)  Betty [name] A 04/06/2017,11:18 AM

## 2017-04-07 ENCOUNTER — Encounter (INDEPENDENT_AMBULATORY_CARE_PROVIDER_SITE_OTHER): Payer: Medicare Other

## 2017-04-07 ENCOUNTER — Ambulatory Visit (INDEPENDENT_AMBULATORY_CARE_PROVIDER_SITE_OTHER): Payer: Medicare Other | Admitting: Vascular Surgery

## 2017-04-07 LAB — CBC
HEMATOCRIT: 24.8 % — AB (ref 35.0–47.0)
HEMOGLOBIN: 8 g/dL — AB (ref 12.0–16.0)
MCH: 26.5 pg (ref 26.0–34.0)
MCHC: 32.3 g/dL (ref 32.0–36.0)
MCV: 81.9 fL (ref 80.0–100.0)
Platelets: 301 10*3/uL (ref 150–440)
RBC: 3.03 MIL/uL — AB (ref 3.80–5.20)
RDW: 19.9 % — ABNORMAL HIGH (ref 11.5–14.5)
WBC: 10.2 10*3/uL (ref 3.6–11.0)

## 2017-04-07 LAB — GLUCOSE, CAPILLARY
GLUCOSE-CAPILLARY: 118 mg/dL — AB (ref 65–99)
Glucose-Capillary: 103 mg/dL — ABNORMAL HIGH (ref 65–99)
Glucose-Capillary: 157 mg/dL — ABNORMAL HIGH (ref 65–99)
Glucose-Capillary: 157 mg/dL — ABNORMAL HIGH (ref 65–99)

## 2017-04-07 LAB — HEPARIN LEVEL (UNFRACTIONATED): HEPARIN UNFRACTIONATED: 0.42 [IU]/mL (ref 0.30–0.70)

## 2017-04-07 LAB — PROTIME-INR
INR: 2
Prothrombin Time: 23 seconds — ABNORMAL HIGH (ref 11.4–15.2)

## 2017-04-07 MED ORDER — METAXALONE 800 MG PO TABS
400.0000 mg | ORAL_TABLET | Freq: Three times a day (TID) | ORAL | Status: DC | PRN
  Administered 2017-04-07 – 2017-04-08 (×4): 400 mg via ORAL
  Filled 2017-04-07 (×5): qty 0.5

## 2017-04-07 MED ORDER — HYDROCODONE-ACETAMINOPHEN 5-325 MG PO TABS
1.0000 | ORAL_TABLET | Freq: Four times a day (QID) | ORAL | Status: DC | PRN
  Administered 2017-04-07 – 2017-04-09 (×5): 1 via ORAL
  Filled 2017-04-07 (×5): qty 1

## 2017-04-07 NOTE — Care Management (Signed)
Barrier- INR- needs to be 2.5 before discharge.  Today it is 2.00

## 2017-04-07 NOTE — Progress Notes (Signed)
Physical Therapy Treatment Patient Details Name: Betty [name] MRN: [id number] DOB: 06-Nov-1946 Today's Date: 04/07/2017    History of Present Illness  71 y.o. female who presents with Oral hemorrhage after dental extraction. Patient had 4 teeth extracted a dentist office earlier today, and had significant hemorrhage afterwards. Here in the ED she was initially hypotensive and had hemoglobin of around 5. Patient is on warfarin for chronic anticoagulation for mechanical mitral valve and atrial fibrillation.  Her INR initially in the ED was supratherapeutic at 4.9.  Pt recieved 2 units of blood.    PT Comments    Pt in recliner, ready to ambulate.  Chart reviewed prior and fall noted.  Stated she has some R shoulder pain and some increased fear/hesitancy now with gait.  Stood and initially ambulated 10' pushing IV pole with generally unsteady gait.  Pt given RW and was able to continue 150' with walker and min guard.  Overall decreased gait quality today with increased fatigue and decreased balance.  "I am just all shook up from that fall"  Will continue with current goals.   Follow Up Recommendations  Home health PT     Equipment Recommendations  Rolling waker   Recommendations for Other Services       Precautions / Restrictions      Mobility  Bed Mobility               General bed mobility comments: in recliner upon arrival  Transfers Overall transfer level: Independent Equipment used: None             General transfer comment: Pt was able to stand up w/o assist  Ambulation/Gait Ambulation/Gait assistance: Min guard Ambulation Distance (Feet): 150 Feet Assistive device: Rolling walker (2 wheeled) Gait Pattern/deviations: Step-through pattern         Stairs            Wheelchair Mobility    Modified Rankin (Stroke Patients Only)       Balance Overall balance assessment: Needs assistance Sitting-balance support: Bilateral upper extremity  supported Sitting balance-Leahy Scale: Fair                                      Cognition Arousal/Alertness: Awake/alert Behavior During Therapy: WFL for tasks assessed/performed Overall Cognitive Status: Within Functional Limits for tasks assessed                                        Exercises      General Comments        Pertinent Vitals/Pain Pain Assessment: 0-10 Pain Score: 5  Pain Location: R shoulder from fall last night Pain Descriptors / Indicators: Sore Pain Intervention(s): Limited activity within patient's tolerance    Home Living                      Prior Function            PT Goals (current goals can now be found in the care plan section) Progress towards PT goals: Progressing toward goals    Frequency    Min 2X/week      PT Plan Current plan remains appropriate    Co-evaluation             End of Session Equipment Utilized During Treatment: Gait  belt Activity Tolerance: Patient limited by fatigue Patient left: in chair;with chair alarm set;with call bell/phone within reach         Time: 1246-1301 PT Time Calculation (min) (ACUTE ONLY): 15 min  Charges:  $Gait Training: 8-22 mins                    G Codes:       Chesley Noon, PTA 04/07/17,

## 2017-04-07 NOTE — Progress Notes (Signed)
Nutrition Brief Note  Patient identified to be seen for length of stay.  Wt Readings from Last 15 Encounters:  04/07/17 232 lb (105.2 kg)  06/28/16 249 lb 9.6 oz (113.2 kg)  05/10/16 239 lb (108.4 kg)  04/30/16 227 lb 4.8 oz (103.1 kg)  04/01/16 233 lb 12.8 oz (106.1 kg)  03/13/16 238 lb (108 kg)  03/06/16 242 lb 3.2 oz (109.9 kg)  01/17/16 240 lb (108.9 kg)  04/27/15 251 lb 12.8 oz (114.2 kg)  01/20/15 245 lb 8 oz (111.4 kg)  12/30/14 241 lb 8 oz (109.5 kg)  09/09/14 242 lb 8 oz (110 kg)  06/28/14 239 lb 8 oz (108.6 kg)  06/17/14 240 lb (108.9 kg)  05/03/14 236 lb 4 oz (91.25 kg)   71 year old female with PMHx of esophageal reflux, A-fib, diastolic heart failure, HTN, DM type 2, COPD who presented with acute blood loss anemia after oral surgery to remove 4 teeth.  Spoke with patient at bedside. She reports her appetite is great and unchanged from baseline. She reports eating mainly 90-100% of three meals daily. She reports the only weight loss she is experiencing is from fluid losses after being on Lasix. Per chart patient has lost 2.3 kg during admission. She reports she is unsure of UBW PTA due to changes with fluid status. She reports she has not been compliant with 2 gram sodium diet at home - encouraged patient to follow 2 grams sodium diet. Answered questions regarding sodium content of certain foods. Patient requesting hot dog but they are not on menu. Discussed that at home she can have a hot dog occasionally as long as her total sodium intake is still within 2 grams.   Nutrition-Focused physical exam completed. Findings are no fat depletion, no muscle depletion, and no edema.   Patient does not meet criteria for malnutrition at this time.  Body mass index is 34.26 kg/m. Patient meets criteria for Obesity Class I based on current BMI.   Current diet order is 2 gram sodium, patient is consuming approximately 60-100% of meals at this time. Labs and medications reviewed.   No  nutrition interventions warranted at this time. If nutrition issues arise, please consult RD.   Willey Blade, MS, RD, LDN Pager: [phone] After Hours Pager: [phone]

## 2017-04-07 NOTE — Progress Notes (Signed)
ANTICOAGULATION CONSULT NOTE - Follow Up Consult  Pharmacy Consult for Warfarin dosing with Heparin drip bridge Indication: mechanical mitral valve/atrial fibrillation       Allergies  Allergen Reactions  . Amiodarone Other (See Comments)    Pt states that this medication causes lung bleeding.      Patient Measurements: Height: 5\' 9"  (175.3 cm) Weight: 235 lb 8 oz (106.8 kg) IBW/kg (Calculated) : 66.2 Heparin Dosing Weight: 90 kg  Vital Signs: Temp: 98.6 F (37 C) (04/15 0611) Temp Source: Oral (04/15 0611) BP: 95/62 (04/15 0959) Pulse Rate: 75 (04/15 0959)  Labs:  Recent Labs (last 2 labs)    Recent Labs  04/04/17 0509  04/05/17 0003 04/05/17 0440 04/05/17 0738 04/06/17 0404  HGB  --   --   --   --  8.3* 7.9*  HCT  --   --   --   --  25.6* 24.7*  PLT  --   --   --   --  294 299  LABPROT 17.8*  --   --  19.5*  --  21.5*  INR 1.45  --   --  1.63  --  1.84  HEPARINUNFRC 0.25*  < > 0.38  --  0.53 0.54  CREATININE  --   --   --  0.89  --   --   < > = values in this interval not displayed.    Estimated Creatinine Clearance: 76.5 mL/min (by C-G formula based on SCr of 0.89 mg/dL).   Medications:  Patient on warfarin at home 5 mg every day except Thursday, 7.5 mg on Thursdays.   Assessment: Patient admitted on 4/5 with significant bleeding from mouth s/p extraction of 4 teeth. Patient was hypotensive on admission with a Hgb of 5.9 and INR of 4.9. She received vitamin K and KCentra in ED on 4/5. Appears patient recently was taking azithromycin per med rec.  Patient was taking warfarin prior to admission for a mechanical mitral valve as well as atrial fibrillation. Once bleeding stopped, patient was restarted on warfarin with a heparin drip bridge. INR subtherapeutic post-KCentra and vitamin K. Post KCentra INR ordered per protocol was discontinued by MD.  Dosing history: DateINRDoseComments 4/54.9-- KCentra  and vitamin K 4/61.19;  5 mg 4/7 1.07;  7.5 mg ([place])  4/8 1.03; 5 mg  4/9 1.06;  7.5 mg 4/10 1.11;  5 mg  4/11 1.28;  5 mg 4/12: 1.38;  5 mg 4/13: 1.45  7.5 mg 4/14 1.637.5 mg 4/15       1.84  7.5 mg  4/16        2.0     7.5 mg   Goal of Therapy: INR 2.5-3.5 Heparin level 0.3-0.7. Monitor platelets by anticoagulation protocol: Yes  Plan:  4/15 Heparin Drip: @ 0400 HL: 0.54 therapeutic will continue current rate and recheck w/ am labs. Warfarin: INR= 1.84. Will order Warfarin 7.5 mg po daily. f/u INR in am. (mechanical mitral valve goal INR 2.5-3.5)  4/16 @ 0324 HL 0.42 therapeutic. Will continue current regimen and recheck w/ am labs. Warfarin: Will give 7.5 mg PO x 1 today.   [name], PharmD  Clinical Pharmacist 04/07/2017

## 2017-04-07 NOTE — Progress Notes (Signed)
ANTICOAGULATION CONSULT NOTE - Follow Up Consult  Pharmacy Consult for Warfarin dosing with Heparin drip bridge Indication: mechanical mitral valve/atrial fibrillation       Allergies  Allergen Reactions  . Amiodarone Other (See Comments)    Pt states that this medication causes lung bleeding.      Patient Measurements: Height: 5\' 9"  (175.3 cm) Weight: 235 lb 8 oz (106.8 kg) IBW/kg (Calculated) : 66.2 Heparin Dosing Weight: 90 kg  Vital Signs: Temp: 98.6 F (37 C) (04/15 0611) Temp Source: Oral (04/15 0611) BP: 95/62 (04/15 0959) Pulse Rate: 75 (04/15 0959)  Labs:  Recent Labs (last 2 labs)    Recent Labs  04/04/17 0509  04/05/17 0003 04/05/17 0440 04/05/17 0738 04/06/17 0404  HGB  --   --   --   --  8.3* 7.9*  HCT  --   --   --   --  25.6* 24.7*  PLT  --   --   --   --  294 299  LABPROT 17.8*  --   --  19.5*  --  21.5*  INR 1.45  --   --  1.63  --  1.84  HEPARINUNFRC 0.25*  < > 0.38  --  0.53 0.54  CREATININE  --   --   --  0.89  --   --   < > = values in this interval not displayed.    Estimated Creatinine Clearance: 76.5 mL/min (by C-G formula based on SCr of 0.89 mg/dL).   Medications:  Patient on warfarin at home 5 mg every day except Thursday, 7.5 mg on Thursdays.   Assessment: Patient admitted on 4/5 with significant bleeding from mouth s/p extraction of 4 teeth. Patient was hypotensive on admission with a Hgb of 5.9 and INR of 4.9. She received vitamin K and KCentra in ED on 4/5. Appears patient recently was taking azithromycin per med rec.  Patient was taking warfarin prior to admission for a mechanical mitral valve as well as atrial fibrillation. Once bleeding stopped, patient was restarted on warfarin with a heparin drip bridge. INR subtherapeutic post-KCentra and vitamin K. Post KCentra INR ordered per protocol was discontinued by MD.  Dosing history: DateINRDoseComments 4/54.9-- KCentra  and vitamin K 4/61.19;  5 mg 4/7 1.07;  7.5 mg ([place])  4/8 1.03; 5 mg  4/9 1.06;  7.5 mg 4/10 1.11;  5 mg  4/11 1.28;  5 mg 4/12: 1.38;  5 mg 4/13: 1.45  7.5 mg 4/14 1.637.5 mg 4/15       1.84    Goal of Therapy: INR 2.5-3.5 Heparin level 0.3-0.7. Monitor platelets by anticoagulation protocol: Yes  Plan: HL = 0.40 (therapeutic). Will continue heparin drip at current rate of 1300 units/hr and order confirmatory HL in 6 hours. CBC ordered daily with AM labs.  4/7 AM heparin level 0.40. Continue current regimen. Recheck CBC and heparin level with tomorrow AM labs.  Will order warfarin 5 mg PO dose this evening - essentially new start after reversal so expect it will take several days to see increase in INR.  4/7 1300 spoke with Dr. Earleen Newport and would like to give patient higher one time dose of warfarin since INR is trending down. Ordered warfarin 7.5 mg @ 1800 x 1 dose  4/8 AM heparin level 0.28. 1400 unit bolus and increase rate to 1500 units/hr. Recheck in 6 hours. INR 1.03. Daily warfarin dose of 5 mg reordered.  4/8: Heparin level resulted @ 0.54. Will recheck  level @2100 .   4/8: HL @ 21:00 = 0.33 Will continue this pt on current rate and recheck HL on 4/9 @ 0500.   4/9 AM heparin level 0.51. Continue current regimen. Recheck CBC and heparin level tomorrow AM. INR 1.06. Will give 7.5 mg warfarin x1 today and resume 5 mg daily tomorrow.  4/10 AM HL therapeutic. Continue current rate. Recheck CBC and HL tomorrow AM. INR 1.11. Received 7.5 mg dose yesterday. Will resume 5 mg po daily dose and recheck INR tomorrow with AM labs.   4/11 @ 0500 HL 0.24 subtherapeutic from goal. Will bolus w/ 1500 units IV x 1 and increase rate to 1700 units/hour and will recheck HL @ 1400. Recent H/H shows Hgb trending down: 8.0 - 7.5 - 7.2. May need to stop heparin if there is signs/symptoms of bleeding or Hgb  continues to drop.  4/11: Heparin level resulted @ 0.36, which is therapeutic. Will continue current gtt rate. Will recheck Heparin level 22:00. Will give warfarin 5 mg PO x 1.   4/12 @ 0600 HL 0.38 therapeutic will continue current rate and will recheck HL w/ am labs. Will give warfarin 5mg  PO x 1 today.   4/13 @ 0500 HL 0.25 subtherapeutic. Will rebolus w/ 1600 units IV x 1 and increase rate to 1800 units/hr and will recheck HL @ 1300.  4/13: Will give warfarin 7.5 mg PO x 1. Heparin level @ 0.28. Will give Heparin bolus of 1300 units x 1. Will increase heparin gtt rate to 2000 units/hr. Will order heparin level 0000.   4/14: Heparin:HL@0003 = 0.38, HL@0738 =0.53. Will continue current rate of 2000 units/hr and recheck with am labs. Warfarin:INR= 1.63. Will give Warfarin 7.5 mg po x 1 again today. F/u INR in am.  4/15 Heparin Drip: @ 0400 HL: 0.54 therapeutic will continue current rate and recheck w/ am labs. Warfarin: INR= 1.84. Will order Warfarin 7.5 mg po daily. f/u INR in am. (mechanical mitral valve goal INR 2.5-3.5)  4/16 @ 0324 HL 0.42 therapeutic. Will continue current regimen and recheck w/ am labs.  Thank you for this consult.  Tobie Lords, PharmD, BCPS Clinical Pharmacist 04/07/2017

## 2017-04-07 NOTE — Progress Notes (Signed)
Patient ID: Betty [name], female   DOB: 05-03-1946, 71 y.o.   MRN: [id number]  [facility] PROGRESS NOTE  Betty [name] NWG:[id number] DOB: 04/29/1946 DOA: 03/27/2017 PCP: Dion Body, MD  HPI/Subjective:   c/o left shoulder pain, no shortness of breath.. Objective: Vitals:   04/06/17 2350 04/07/17 0444  BP: (!) 101/52 99/60  Pulse: 73 80  Resp: 16 18  Temp: 97.7 F (36.5 C) 98.8 F (37.1 C)    Filed Weights   04/05/17 0500 04/06/17 0340 04/07/17 0524  Weight: 107.5 kg (237 lb 1.6 oz) 106.8 kg (235 lb 8 oz) 105.2 kg (232 lb)    ROS: Review of Systems  Constitutional: Negative for chills and fever.  Eyes: Negative for blurred vision.  Respiratory: Negative for cough and shortness of breath.   Cardiovascular: Negative for chest pain.  Gastrointestinal: Negative for abdominal pain, constipation, diarrhea, nausea and vomiting.  Genitourinary: Negative for dysuria.  Musculoskeletal: Positive for falls and joint pain.  Neurological: Positive for weakness. Negative for dizziness and headaches.  c/o of left shoulder pain  Exam: Physical Exam  Constitutional: She is oriented to person, place, and time.  HENT:  Nose: No mucosal edema.  Mouth/Throat: No oropharyngeal exudate or posterior oropharyngeal edema.  Eyes: Conjunctivae, EOM and lids are normal. Pupils are equal, round, and reactive to light.  Neck: No JVD present. Carotid bruit is not present. No edema present. No thyroid mass and no thyromegaly present.  Cardiovascular: S1 normal and S2 normal.  Exam reveals no gallop.   No murmur heard. Pulses:      Dorsalis pedis pulses are 2+ on the right side, and 2+ on the left side.  Respiratory: No respiratory distress. She has no wheezes. She has no rhonchi. She has no rales.  GI: Soft. Bowel sounds are normal. There is no tenderness.  Musculoskeletal:       Right ankle: She exhibits no swelling.       Left ankle: She exhibits no swelling.  Lymphadenopathy:    She  has no cervical adenopathy.  Neurological: She is alert and oriented to person, place, and time. No cranial nerve deficit.  Skin: Skin is warm. No rash noted. Nails show no clubbing.  Bruising bilateral lower face and neck  Psychiatric: She has a normal mood and affect.      Data Reviewed: Basic Metabolic Panel:  Recent Labs Lab 04/03/17 0552 04/05/17 0440  NA 136 135  K 4.0 4.0  CL 107 103  CO2 24 27  GLUCOSE 129* 119*  BUN 11 14  CREATININE 0.67 0.89  CALCIUM 8.4* 8.6*   CBC:  Recent Labs Lab 04/02/17 0511  04/02/17 2232 04/03/17 0552 04/05/17 0738 04/06/17 0404 04/07/17 0324  WBC 9.5  --   --  9.6 10.2 11.0 10.2  HGB 7.2*  < > 8.0* 8.1* 8.3* 7.9* 8.0*  HCT 22.4*  < > 24.7* 25.7* 25.6* 24.7* 24.8*  MCV 84.5  --   --  84.1 84.1 83.0 81.9  PLT 227  --   --  242 294 299 301  < > = values in this interval not displayed.  BNP (last 3 results)  Recent Labs  07/26/16 1159  BNP 504.0*    CBG:  Recent Labs Lab 04/06/17 0756 04/06/17 1140 04/06/17 1641 04/06/17 2110 04/07/17 0750  GLUCAP 116* 128* 136* 96 118*     Scheduled Meds: . atorvastatin  20 mg Oral Daily  . azaTHIOprine  100 mg Oral Daily  .  diltiazem  180 mg Oral Daily  . ferrous sulfate  325 mg Oral Q breakfast  . furosemide  80 mg Intravenous BID  . gabapentin  600 mg Oral BID  . insulin aspart  0-5 Units Subcutaneous QHS  . insulin aspart  0-9 Units Subcutaneous TID WC  . metFORMIN  500 mg Oral BID WC  . metoprolol tartrate  12.5 mg Oral BID  . oxybutynin  5 mg Oral BID  . pantoprazole  40 mg Oral Daily  . piperacillin-tazobactam (ZOSYN)  IV  3.375 g Intravenous Q8H  . propafenone  225 mg Oral Q12H  . rOPINIRole  4 mg Oral QHS  . sodium chloride flush  3 mL Intravenous Q12H  . spironolactone  25 mg Oral Daily  . traZODone  200 mg Oral QHS  . venlafaxine XR  225 mg Oral Daily  . warfarin  7.5 mg Oral q1800  . Warfarin - Pharmacist Dosing Inpatient   Does not apply q1800    Continuous Infusions: . heparin 2,000 Units/hr (04/07/17 0259)    Assessment/Plan:  Acute blood loss anemia. Hemorrhage after oral surgery procedure to remove 4 teeth. Hypotension received total of 4 units of blood transfusion during this hospitalization so far. Hemoglobin stable at 8.. Only slight drop in hemoglobin but overall stable, we will not transfuse at this time.   1. Metallic valve. Patient's Coumadin was reversed with 10 mg of IV vitamin K and Kcentra. It will likely take a while to get the patient's Coumadin level up again. On IV heparin and pharmacy dosing Coumadin. Daily INR checks.INR going up slowly to 2... continue heparin drip, Coumadin. Discussed with the patient's cardiologist. 2. .  INR more than 4 on admission. Continue Coumadin, heparin until INR becomes more than 2.5. Discussed this with patient, patient's sister extensively, also discussed about reversing the effect of Coumadin with vitamin K, [facility]. when she came and why we did that.  3. Atrial fibrillation with rapid ventricular response; heart rate improved, rate 79 bpm at this time, continue Coumadin, continue heparin, and  continue Cardizem, resumed her home dose Rythmol, 4. Hypomagnesemia. Replaced previously 5. Hyperlipidemia unspecified on atorvastatin 6.  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  9. Type 2 diabetes mellitus on sliding scale,restarted metformin , blood sugars controlled. 10.  11. Urinary incontinence on oxybutynin, now has ESBL UTI: Continue isolation, patient is on Zosyn, continue that. Continue isolation. 12. Restless leg syndrome on Requip. Home health physical therapy recommended by PT #3 heart failure with preserved ejection fraction, mechanical mitral valve. Patient on high-dose diuretics at home including 80 MG of torsemide twice a day, patient's cardiologist Dr. Tammi Klippel he recommended starting patient on Lasix 80 mg IV twice a day because patient gained about 9 pounds,  according to him and discharge patient's weight is close to 101 kgs. Patient feels better, BP better, kidney function better so continue IV Lasix today also,.DR.Abraham ' pts cardiologist cell phone number [phone]. Chest xray showed no pneumonia.. 13. History of fall: CT head negative for any hemorrhage or intracranial bleed. X-ray of the left shoulder is negative for fracture. Continue pain medicine, ice packs .physical therapy, out of bed to chair, bedside, the night,.  Discharge home when INR is more than 2.5 . Code Status:     Code Status Orders        Start     Ordered   03/27/17 2252  Full code  Continuous     03/27/17 2251    Code  Status History    Date Active Date Inactive Code Status Order ID Comments User Context   06/26/2016  1:06 AM 06/28/2016  4:39 PM Full Code [id number]  Lance Coon, MD Inpatient   05/10/2016  4:38 PM 05/12/2016  4:59 PM Full Code [id number]  Theodoro Grist, MD ED   04/01/2016  5:13 PM 04/03/2016  5:58 AM Full Code [id number]  Henreitta Leber, MD Inpatient     Disposition Plan: Likely will be with [place] for a while since they reverse the Coumadin with 10 mg of IV vitamin K. Need to have Coumadin level therapeutic prior to going home. Patient does not want to do Lovenox injections.  Time spent: 24 minutes  [employer]

## 2017-04-08 LAB — CBC
HCT: 27.1 % — ABNORMAL LOW (ref 35.0–47.0)
Hemoglobin: 8.5 g/dL — ABNORMAL LOW (ref 12.0–16.0)
MCH: 25.9 pg — ABNORMAL LOW (ref 26.0–34.0)
MCHC: 31.2 g/dL — AB (ref 32.0–36.0)
MCV: 82.9 fL (ref 80.0–100.0)
Platelets: 316 10*3/uL (ref 150–440)
RBC: 3.27 MIL/uL — ABNORMAL LOW (ref 3.80–5.20)
RDW: 19.4 % — AB (ref 11.5–14.5)
WBC: 12 10*3/uL — AB (ref 3.6–11.0)

## 2017-04-08 LAB — GLUCOSE, CAPILLARY
GLUCOSE-CAPILLARY: 124 mg/dL — AB (ref 65–99)
GLUCOSE-CAPILLARY: 131 mg/dL — AB (ref 65–99)
Glucose-Capillary: 104 mg/dL — ABNORMAL HIGH (ref 65–99)
Glucose-Capillary: 144 mg/dL — ABNORMAL HIGH (ref 65–99)

## 2017-04-08 LAB — PROTIME-INR
INR: 2.92
Prothrombin Time: 31.1 seconds — ABNORMAL HIGH (ref 11.4–15.2)

## 2017-04-08 LAB — HEPARIN LEVEL (UNFRACTIONATED): HEPARIN UNFRACTIONATED: 0.58 [IU]/mL (ref 0.30–0.70)

## 2017-04-08 MED ORDER — SODIUM CHLORIDE 0.9 % IV SOLN
1.0000 g | Freq: Three times a day (TID) | INTRAVENOUS | Status: DC
  Filled 2017-04-08 (×2): qty 1

## 2017-04-08 MED ORDER — PROPAFENONE HCL 225 MG PO TABS
225.0000 mg | ORAL_TABLET | Freq: Two times a day (BID) | ORAL | Status: DC
  Administered 2017-04-08 – 2017-04-09 (×3): 225 mg via ORAL
  Filled 2017-04-08 (×3): qty 1

## 2017-04-08 MED ORDER — FUROSEMIDE 40 MG PO TABS
40.0000 mg | ORAL_TABLET | Freq: Two times a day (BID) | ORAL | Status: DC
  Administered 2017-04-08 – 2017-04-09 (×3): 40 mg via ORAL
  Filled 2017-04-08 (×3): qty 1

## 2017-04-08 MED ORDER — DIPHENHYDRAMINE HCL 25 MG PO CAPS
25.0000 mg | ORAL_CAPSULE | Freq: Once | ORAL | Status: AC
  Administered 2017-04-08: 25 mg via ORAL

## 2017-04-08 MED ORDER — MEROPENEM-SODIUM CHLORIDE 1 GM/50ML IV SOLR
1.0000 g | Freq: Three times a day (TID) | INTRAVENOUS | Status: DC
  Administered 2017-04-08 – 2017-04-09 (×3): 1 g via INTRAVENOUS
  Filled 2017-04-08 (×5): qty 50

## 2017-04-08 MED ORDER — WARFARIN SODIUM 2 MG PO TABS
5.0000 mg | ORAL_TABLET | Freq: Every day | ORAL | Status: DC
  Administered 2017-04-08: 5 mg via ORAL
  Filled 2017-04-08: qty 1

## 2017-04-08 NOTE — Progress Notes (Signed)
Patient ID: Betty [name], female   DOB: [birth year], 71 y.o.   MRN: [id number]  [facility] PROGRESS NOTE  Betty [name] JSE:[id number] DOB: May 25, 1946 DOA: 03/27/2017 PCP: Dion Body, MD  HPI/Subjective:    INR.2.9 today. Complains of left shoulder pain not better yet.    Objective: Vitals:   04/08/17 0427 04/08/17 1104  BP: (!) 115/49 (!) 107/52  Pulse: 72 79  Resp: 18   Temp: 97.5 F (36.4 C)     Filed Weights   04/07/17 0524 04/08/17 0500 04/08/17 1221  Weight: 105.2 kg (232 lb) 105.5 kg (232 lb 9.6 oz) 105.4 kg (232 lb 6.4 oz)    ROS: Review of Systems  Constitutional: Negative for chills and fever.  Eyes: Negative for blurred vision.  Respiratory: Negative for cough and shortness of breath.   Cardiovascular: Negative for chest pain.  Gastrointestinal: Negative for abdominal pain, constipation, diarrhea, nausea and vomiting.  Genitourinary: Negative for dysuria.  Musculoskeletal: Positive for falls and joint pain.  Neurological: Positive for weakness. Negative for dizziness and headaches.  c/o of left shoulder pain  Exam: Physical Exam  Constitutional: She is oriented to person, place, and time.  HENT:  Nose: No mucosal edema.  Mouth/Throat: No oropharyngeal exudate or posterior oropharyngeal edema.  Eyes: Conjunctivae, EOM and lids are normal. Pupils are equal, round, and reactive to light.  Neck: No JVD present. Carotid bruit is not present. No edema present. No thyroid mass and no thyromegaly present.  Cardiovascular: S1 normal and S2 normal.  Exam reveals no gallop.   No murmur heard. Pulses:      Dorsalis pedis pulses are 2+ on the right side, and 2+ on the left side.  Respiratory: No respiratory distress. She has no wheezes. She has no rhonchi. She has no rales.  GI: Soft. Bowel sounds are normal. There is no tenderness.  Musculoskeletal:       Right ankle: She exhibits no swelling.       Left ankle: She exhibits no swelling.  Lymphadenopathy:     She has no cervical adenopathy.  Neurological: She is alert and oriented to person, place, and time. No cranial nerve deficit.  Skin: Skin is warm. No rash noted. Nails show no clubbing.  Bruising bilateral lower face and neck  Psychiatric: She has a normal mood and affect.      Data Reviewed: Basic Metabolic Panel:  Recent Labs Lab 04/03/17 0552 04/05/17 0440  NA 136 135  K 4.0 4.0  CL 107 103  CO2 24 27  GLUCOSE 129* 119*  BUN 11 14  CREATININE 0.67 0.89  CALCIUM 8.4* 8.6*   CBC:  Recent Labs Lab 04/03/17 0552 04/05/17 0738 04/06/17 0404 04/07/17 0324 04/08/17 0528  WBC 9.6 10.2 11.0 10.2 12.0*  HGB 8.1* 8.3* 7.9* 8.0* 8.5*  HCT 25.7* 25.6* 24.7* 24.8* 27.1*  MCV 84.1 84.1 83.0 81.9 82.9  PLT 242 294 299 301 316    BNP (last 3 results)  Recent Labs  07/26/16 1159  BNP 504.0*    CBG:  Recent Labs Lab 04/07/17 1139 04/07/17 1635 04/07/17 2101 04/08/17 0806 04/08/17 1159  GLUCAP 103* 157* 157* 124* 131*     Scheduled Meds: . atorvastatin  20 mg Oral Daily  . azaTHIOprine  100 mg Oral Daily  . diltiazem  180 mg Oral Daily  . ferrous sulfate  325 mg Oral Q breakfast  . furosemide  40 mg Oral BID  . gabapentin  600 mg Oral BID  .  insulin aspart  0-5 Units Subcutaneous QHS  . insulin aspart  0-9 Units Subcutaneous TID WC  . metFORMIN  500 mg Oral BID WC  . metoprolol tartrate  12.5 mg Oral BID  . oxybutynin  5 mg Oral BID  . pantoprazole  40 mg Oral Daily  . propafenone  225 mg Oral Q12H  . rOPINIRole  4 mg Oral QHS  . sodium chloride flush  3 mL Intravenous Q12H  . spironolactone  25 mg Oral Daily  . traZODone  200 mg Oral QHS  . venlafaxine XR  225 mg Oral Daily  . warfarin  5 mg Oral q1800  . Warfarin - Pharmacist Dosing Inpatient   Does not apply q1800   Continuous Infusions: . piperacillin-tazobactam (ZOSYN)  IV      Assessment/Plan:  Acute blood loss anemia. Hemorrhage after oral surgery procedure to remove 4 teeth.  Hypotension received total of 4 units of blood transfusion during this hospitalization so far. Hemoglobin stable at 8.. Only slight drop in hemoglobin but overall stable, we will not transfuse at this time.   1. Metallic valve. Patient's Coumadin was reversed with 10 mg of IV vitamin K and Kcentra. I.  INR more than 4 on admission.  Pt received heparin drip  and  Coumadin. And today INR is more than 2.9 so we stopped heparin drip. 2. Atrial fibrillation with rapid ventricular response; heart rate improved, rate 79 bpm at this time,  Due to  soft blood pressures. Pace out  Cardizem and also metoprolol. According to patient's cardiologist patient blood pressure is always low 90s/60s. 3.  4.  5. Hypomagnesemia. Replaced previously 6. Hyperlipidemia unspecified on atorvastatin 7.  8. 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  10. Type 2 diabetes mellitus on sliding scale,restarted metformin , blood sugars controlled. 11.  12. Urinary incontinence on oxybutynin, now has ESBL UTI: Continue isolation, patient is on Zosyn, continue that. Continue isolation. 13. Restless leg syndrome on Requip. Home health physical therapy recommended by PT #3 heart failure with preserved ejection fraction, mechanical mitral valve. Patient on high-dose diuretics at home including 80 MG of torsemide twice a day, stopped IV Lasix today and change it to by mouth Lasix, likely discharge tomorrow morning.     13. History of fall: CT head negative for any hemorrhage or intracranial bleed. X-ray of the left shoulder is negative for fracture.    #4 left shoulder pain likely due to sprain. Patient wanted to otho.can have as an out pt,.  D/c home am . Code Status:     Code Status Orders        Start     Ordered   03/27/17 2252  Full code  Continuous     03/27/17 2251    Code Status History    Date Active Date Inactive Code Status Order ID Comments User Context   06/26/2016  1:06 AM 06/28/2016  4:39 PM Full  Code [id number]  Lance Coon, MD Inpatient   05/10/2016  4:38 PM 05/12/2016  4:59 PM Full Code [id number]  Theodoro Grist, MD ED   04/01/2016  5:13 PM 04/03/2016  5:58 AM Full Code [id number]  Henreitta Leber, MD Inpatient     Disposition Plan: Likely will be with [place] for a while since they reverse the Coumadin with 10 mg of IV vitamin K. Need to have Coumadin level therapeutic prior to going home. Patient does not want to do Lovenox injections.  Time spent: 24 minutes  [facility]

## 2017-04-08 NOTE — Progress Notes (Signed)
ANTICOAGULATION CONSULT NOTE - Follow Up Consult  Pharmacy Consult for Warfarin dosing with Heparin drip bridge Indication: mechanical mitral valve/atrial fibrillation       Allergies  Allergen Reactions  . Amiodarone Other (See Comments)    Pt states that this medication causes lung bleeding.      Patient Measurements: Height: 5\' 9"  (175.3 cm) Weight: 235 lb 8 oz (106.8 kg) IBW/kg (Calculated) : 66.2 Heparin Dosing Weight: 90 kg  Vital Signs: Temp: 98.6 F (37 C) (04/15 0611) Temp Source: Oral (04/15 0611) BP: 95/62 (04/15 0959) Pulse Rate: 75 (04/15 0959)  Labs:  Recent Labs (last 2 labs)    Recent Labs  04/04/17 0509  04/05/17 0003 04/05/17 0440 04/05/17 0738 04/06/17 0404  HGB  --   --   --   --  8.3* 7.9*  HCT  --   --   --   --  25.6* 24.7*  PLT  --   --   --   --  294 299  LABPROT 17.8*  --   --  19.5*  --  21.5*  INR 1.45  --   --  1.63  --  1.84  HEPARINUNFRC 0.25*  < > 0.38  --  0.53 0.54  CREATININE  --   --   --  0.89  --   --   < > = values in this interval not displayed.    Estimated Creatinine Clearance: 76.5 mL/min (by C-G formula based on SCr of 0.89 mg/dL).   Medications:  Patient on warfarin at home 5 mg every day except Thursday, 7.5 mg on Thursdays.   Assessment: Patient admitted on 4/5 with significant bleeding from mouth s/p extraction of 4 teeth. Patient was hypotensive on admission with a Hgb of 5.9 and INR of 4.9. She received vitamin K and KCentra in ED on 4/5. Appears patient recently was taking azithromycin per med rec.  Patient was taking warfarin prior to admission for a mechanical mitral valve as well as atrial fibrillation. Once bleeding stopped, patient was restarted on warfarin with a heparin drip bridge. INR subtherapeutic post-KCentra and vitamin K. Post KCentra INR ordered per protocol was discontinued by MD.  Dosing history: DateINRDoseComments 4/54.9-- KCentra  and vitamin K 4/61.19;  5 mg 4/7 1.07;  7.5 mg ([place])  4/8 1.03; 5 mg  4/9 1.06;  7.5 mg 4/10 1.11;  5 mg  4/11 1.28;  5 mg 4/12: 1.38;  5 mg 4/13: 1.45  7.5 mg 4/14 1.637.5 mg 4/15       1.84  7.5 mg  4/16        2.0     7.5 mg  4/17     2.92      5 mg  Goal of Therapy: INR 2.5-3.5 Heparin level 0.3-0.7. Monitor platelets by anticoagulation protocol: Yes  Plan:  4/15 Heparin Drip: @ 0400 HL: 0.54 therapeutic will continue current rate and recheck w/ am labs. Warfarin: INR= 1.84. Will order Warfarin 7.5 mg po daily. f/u INR in am. (mechanical mitral valve goal INR 2.5-3.5)  4/16 @ 0324 HL 0.42 therapeutic. Will continue current regimen and recheck w/ am labs. Warfarin: Will give 7.5 mg PO x 1 today.   4/17 AM heparin level 0.58. Continue current regimen. Recheck with tomorrow AM labs. 5 mg warfarin ordered  Sim Boast, PharmD, BCPS  04/08/17 6:36 AM

## 2017-04-08 NOTE — Care Management Important Message (Signed)
Important Message  Patient Details  Name: Betty [name] MRN: [id number] Date of Birth: [birth year]   Medicare Important Message Given:  Yes    Katrina Stack, RN 04/08/2017, 4:38 PM

## 2017-04-08 NOTE — Progress Notes (Signed)
Physical Therapy Treatment Patient Details Name: Betty [name] MRN: [id number] DOB: [birth year] Today's Date: 04/08/2017    History of Present Illness  71 y.o. female who presents with Oral hemorrhage after dental extraction. Patient had 4 teeth extracted a dentist office earlier today, and had significant hemorrhage afterwards. Here in the ED she was initially hypotensive and had hemoglobin of around 5. Patient is on warfarin for chronic anticoagulation for mechanical mitral valve and atrial fibrillation.  Her INR initially in the ED was supratherapeutic at 4.9.  Pt recieved 2 units of blood.    PT Comments    Bed mobility without assist.  Pt stated she feels better today.  Stood without assist or AD.  Stated she wanted to try walking today without a device.  She ambulated 25' with no AD with staggered gait.  Pt used wheelchair handles for stability and was able to continue ambulating x 2 around unit with UE support and overall improved gait.   Upon return to room, wheelchair left at the door and she reached out for sink and bed rails for balance.  Declined further activity due to fatigue.  Discussed at length home safety.  Recommended RW for mobility at home - especially outside or in community due to balance and decreased endurance.  Pt initially stated she did not think she needed RW for home but she was encouraged to consider it to decrease fall risk.  She stated she does not have one at home.   Follow Up Recommendations  Home health PT;Supervision - Intermittent     Equipment Recommendations  Rolling walker with 5" wheels    Recommendations for Other Services       Precautions / Restrictions Precautions Precautions: Fall Restrictions Weight Bearing Restrictions: No    Mobility  Bed Mobility Overal bed mobility: Modified Independent                Transfers Overall transfer level: Modified independent Equipment used: None             General transfer comment: Pt  was able to stand up w/o assist  Ambulation/Gait Ambulation/Gait assistance: Min guard Ambulation Distance (Feet): 400 Feet Assistive device: Pushed wheelchair Gait Pattern/deviations: Step-through pattern     General Gait Details: Gait improved today with less fatigue and increased gait quality.  Without UE support with gait, pt unsteady and staggers left and right.  Improved greatly with pushing wheelchair.     Stairs            Wheelchair Mobility    Modified Rankin (Stroke Patients Only)       Balance Overall balance assessment: Needs assistance Sitting-balance support: Feet supported Sitting balance-Leahy Scale: Normal     Standing balance support: Bilateral upper extremity supported Standing balance-Leahy Scale: Fair                              Cognition Arousal/Alertness: Awake/alert Behavior During Therapy: WFL for tasks assessed/performed Overall Cognitive Status: Within Functional Limits for tasks assessed                                        Exercises      General Comments        Pertinent Vitals/Pain Pain Assessment: No/denies pain    Home Living  Prior Function            PT Goals (current goals can now be found in the care plan section) Progress towards PT goals: Progressing toward goals    Frequency    Min 2X/week      PT Plan Current plan remains appropriate    Co-evaluation             End of Session Equipment Utilized During Treatment: Gait belt Activity Tolerance: Patient limited by fatigue Patient left: in chair;with chair alarm set;with call bell/phone within reach;with nursing/sitter in room         Time: 0851-0906 PT Time Calculation (min) (ACUTE ONLY): 15 min  Charges:  $Gait Training: 8-22 mins                    G Codes:       Chesley Noon, PTA 04/08/17, 9:18 AM

## 2017-04-08 NOTE — Progress Notes (Signed)
Called Dr. Vianne Bulls to notify her that the patient's BP was on the low side of normal and that yesterday she received her medications and her BP dropped some in the evening.  She gave a verbal order to give the metoprolol and recheck the BP and HR and call her back as to whether to given the cardizem or not.

## 2017-04-09 LAB — GLUCOSE, CAPILLARY
GLUCOSE-CAPILLARY: 117 mg/dL — AB (ref 65–99)
Glucose-Capillary: 105 mg/dL — ABNORMAL HIGH (ref 65–99)

## 2017-04-09 LAB — CBC
HCT: 26.5 % — ABNORMAL LOW (ref 35.0–47.0)
HEMOGLOBIN: 8.3 g/dL — AB (ref 12.0–16.0)
MCH: 26 pg (ref 26.0–34.0)
MCHC: 31.4 g/dL — AB (ref 32.0–36.0)
MCV: 82.7 fL (ref 80.0–100.0)
PLATELETS: 306 10*3/uL (ref 150–440)
RBC: 3.2 MIL/uL — AB (ref 3.80–5.20)
RDW: 19.9 % — ABNORMAL HIGH (ref 11.5–14.5)
WBC: 10.3 10*3/uL (ref 3.6–11.0)

## 2017-04-09 LAB — PROTIME-INR
INR: 3.26
PROTHROMBIN TIME: 34 s — AB (ref 11.4–15.2)

## 2017-04-09 LAB — CREATININE, SERUM
CREATININE: 0.88 mg/dL (ref 0.44–1.00)
GFR calc Af Amer: >60 mL/min (ref >60)

## 2017-04-09 MED ORDER — HYDROCODONE-ACETAMINOPHEN 5-325 MG PO TABS: 1.0000 | ORAL_TABLET | Freq: Three times a day (TID) | ORAL | 0 refills | Status: DC | PRN

## 2017-04-09 MED ORDER — METOPROLOL TARTRATE 25 MG PO TABS: 12.5000 mg | ORAL_TABLET | Freq: Two times a day (BID) | ORAL | 0 refills | Status: DC

## 2017-04-09 MED ORDER — FOSFOMYCIN TROMETHAMINE 3 G PO PACK
3.0000 g | PACK | Freq: Once | ORAL | Status: DC
  Filled 2017-04-09: qty 3

## 2017-04-09 MED ORDER — FOSFOMYCIN TROMETHAMINE 3 G PO PACK: 3.0000 g | PACK | ORAL | 0 refills | Status: DC

## 2017-04-09 MED ORDER — METAXALONE 400 MG PO TABS: 400.0000 mg | ORAL_TABLET | Freq: Three times a day (TID) | ORAL | 0 refills | Status: DC | PRN

## 2017-04-09 MED ORDER — ROPINIROLE HCL 2 MG PO TABS: 2.0000 mg | ORAL_TABLET | Freq: Every evening | ORAL | 0 refills | Status: DC | PRN

## 2017-04-09 NOTE — Care Management (Signed)
Notified [facility] of discharge.  Requested order for home health nurse, physical therapy and order and date for protime draw.

## 2017-04-09 NOTE — Progress Notes (Signed)
Pt A and O x 4. VSS. Pt tolerating diet well. Minimal complaints of pain, no nausea. IV removed intact, prescriptions given. Pt voiced understanding of discharge instructions with no further questions. Pt discharged via wheelchair with nurse aide.

## 2017-04-10 NOTE — Discharge Summary (Signed)
Betty [name], is a 71 y.o. female  DOB 1946/05/30  MRN [id number].  Admission date:  03/27/2017  Admitting Physician  Lance Coon, MD  Discharge Date:  04/09/2017   Primary MD  Dion Body, MD  Recommendations for primary care physician for things to follow:   Follow-up with primary doctor in one week Follow up with primary cardiologist in 2 weeks.   Admission Diagnosis  Acute blood loss anemia [D62] Elevated INR [R79.1] Status post tooth extraction [K08.409] Hypotension, unspecified hypotension type [I95.9]   Discharge Diagnosis  Acute blood loss anemia [D62] Elevated INR [R79.1] Status post tooth extraction [K08.409] Hypotension, unspecified hypotension type [I95.9]    Principal Problem:   Postprocedural hemorrhage due to complication of oral surgery Active Problems:   GERD   Diabetes ([place])   Essential hypertension, benign   Diastolic congestive heart failure (HCC)   A-fib (H[facility])   Acute blood loss anemia   H/O mitral valve replacement with mechanical valve      Past Medical History:  Diagnosis Date  . Acute diastolic heart failure ([place])   . Allergy   . 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 Asthma   . Atrial fibrillation ([place])   . Backache, unspecified   . Cardiomegaly   . COPD (chronic obstructive pulmonary disease) ([place])   . Diabetes mellitus without complication ([place])   . 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 Esophageal reflux   . Headache(784.0)   . Herpes zoster without mention of complication   . Hx: UTI (urinary tract infection)   . Hypertension    heart controlled w CHF  . Nontoxic uninodular goiter   . Obesity, unspecified   . Osteoarthrosis, unspecified whether generalized or localized, unspecified site   . Unspecified sleep apnea   . Urine  incontinence    hx of    Past Surgical History:  Procedure Laterality Date  . ABDOMINAL HYSTERECTOMY  1979   complete (for precancerous cells)  . ABLATION  2011 & 2014  . CHOLECYSTECTOMY    . OOPHORECTOMY         History of present illness and  Hospital Course:     Kindly see H&P for history of present illness and admission details, please review complete Labs, Consult reports and Test reports for all details in brief  HPI  from the history and physical done on the day of admission 71 year old female patient with multiple medical problems of mechanical mitral valve, hypertension, paroxysmal atrial fibrillation, on long-term anticoagulation because of mechanical valve brought in because of severe  Mouth bleeding after her tooth extraction. Patient for follow up for the extracted by dentist while on Coumadin which resulted in severe oral  hemorrhage. Patient hemoglobin dropped to 6.9 on admission,   Hospital Course  # #1: Hemorrhage after dental extraction: Patient had hemoglobin 5.9 on admission ,, INR 4.9 in the emergency room. Patient received 2 units of packed RBC, she also received K centra, and vitamin K. Received 10 units of for vitamin K, K centra by ER physician. Started on heparin drip after her bleeding stopped. Patient remained on heparin drip until INR became more than 2.5. Her hemoglobin stayed stable around 8.5 at the time of discharge, patient received multiple units of blood transfusion because her hemoglobin kept dropping. Her hemorrhage is stopped. Patient initially was on pured diet and then changed to regular diet once she is able to chew well. Patient did not want to go home  on Lovenox injections.  #2 atrial fibrillation with RVR: Patient received metoprolol, hwe restarted  home dose Rythmol, Cardizem has been restarted, I spoke with patient's cardiologist from [facility] Dr. 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  #5 RLS: Continue Requip next  #6 ESBL UTI: Patient received Zosyn, changed to imipenem while in the hospital, discharged home with fosfomycin 3 g on alternate days for 3 doses. Marland Kitchen 7 history of fall. Patient complained of left shoulder pain. X-ray of the left shoulder, head CT are unremarkable. Patient can follow-up with primary doctor and have [facility] follow-up for possible steroid injection she says in the past it helped her. Discharge Condition: stable   Follow UP  Follow-up Information    [facility]. Call on 04/09/2017.   Specialty:  [facility] Why:   For Nurse and physical therapy Contact information: [place] [address] [phone]             Discharge Instructions  and  Discharge Medications      Allergies as of 04/09/2017      Reactions   Amiodarone Other (See Comments)   Pt states that this medication causes lung bleeding.        Medication List    STOP taking these medications   chlorpheniramine-HYDROcodone 10-8 MG/5ML Suer Commonly known as:  TUSSIONEX   metoprolol succinate 25 MG 24 hr tablet Commonly known as:  TOPROL-XL   predniSONE 10 MG (21) Tbpk tablet Commonly known as:  STERAPRED UNI-PAK 21 TAB     TAKE these medications   albuterol 108 (90 Base) MCG/ACT inhaler Commonly known as:  PROVENTIL HFA;VENTOLIN HFA Inhale 2 puffs into the lungs every 4 (four) hours as needed for wheezing or shortness of breath.   albuterol (2.5 MG/3ML) 0.083% nebulizer solution Commonly known as:  PROVENTIL Take 3 mLs (2.5 mg total) by nebulization every 4 (four) hours as needed for wheezing or shortness of breath.   aspirin EC 81 MG tablet Take 81 mg by mouth daily.   atorvastatin 20 MG tablet Commonly known as:  LIPITOR Take 20 mg by mouth daily.   azaTHIOprine 50 MG tablet Commonly known as:  IMURAN Take 100 mg by mouth daily.   azithromycin 250 MG tablet Commonly  known as:  ZITHROMAX Z-PAK Take 2 tabs on day 1 And then 1 tab every day until finished.   Biotin 5000 MCG Caps Take 5,000 mcg by mouth daily.   calcium-vitamin D 500-200 MG-UNIT tablet Commonly known as:  OSCAL WITH D Take 1 tablet by mouth daily.   diltiazem 180 MG 24 hr tablet Commonly known as:  CARDIZEM LA Take 180 mg by mouth daily.   ferrous sulfate 325 (65 FE) MG tablet Take 325 mg by mouth daily with breakfast.   fosfomycin 3 g Pack Commonly known as:  MONUROL Take 3 g by mouth every other day.   furosemide 40 MG tablet Commonly known as:  LASIX Take 40 mg by mouth daily.   gabapentin 600 MG tablet Commonly known as:  NEURONTIN Take 600 mg by mouth 2 (two) times daily.   HYDROcodone-acetaminophen 5-325 MG tablet Commonly known as:  NORCO/VICODIN Take 1 tablet by mouth every 8 (eight) hours as needed for moderate pain.   metaxalone 400 MG tablet Commonly known as:  SKELAXIN Take 1 tablet (400 mg total) by mouth 3 (three) times daily as needed  for muscle spasms.   metFORMIN 1000 MG tablet Commonly known as:  GLUCOPHAGE Take 1,000 mg by mouth 2 (two) times daily with a meal.   metolazone 2.5 MG tablet Commonly known as:  ZAROXOLYN Take 2.5 mg by mouth 2 (two) times a week. prn   metoprolol tartrate 25 MG tablet Commonly known as:  LOPRESSOR Take 0.5 tablets (12.5 mg total) by mouth 2 (two) times daily.   oxybutynin 5 MG tablet Commonly known as:  DITROPAN Take 5 mg by mouth 2 (two) times daily.   pantoprazole 40 MG tablet Commonly known as:  PROTONIX Take 40 mg by mouth daily.   potassium chloride 10 MEQ tablet Commonly known as:  K-DUR Take 2 tablets (20 mEq total) by mouth daily. While on lasix   propafenone 225 MG tablet Commonly known as:  RYTHMOL Take 225 mg by mouth every 12 (twelve) hours.   rOPINIRole 2 MG tablet Commonly known as:  REQUIP Take 1 tablet (2 mg total) by mouth at bedtime as needed (restless leg syndrome). What  changed:  medication strength  how much to take  when to take this  reasons to take this   spironolactone 50 MG tablet Commonly known as:  ALDACTONE Take 50 mg by mouth daily.   torsemide 20 MG tablet Commonly known as:  DEMADEX Take 80 mg by mouth 2 (two) times daily.   traZODone 100 MG tablet Commonly known as:  DESYREL Take 200 mg by mouth at bedtime.   Venlafaxine HCl 225 MG Tb24 Take 225 mg by mouth daily.   warfarin 5 MG tablet Commonly known as:  COUMADIN Take 5 mg by mouth daily. 5mg  everyday except THURSDAY   warfarin 7.5 MG tablet Commonly known as:  COUMADIN Take 7.5 mg by mouth daily. ONLY on thursday         Diet and Activity recommendation: See Discharge Instructions above   Consults obtained - Physical therapy   Major procedures and Radiology Reports - PLEASE review detailed and final reports for all details, in brief -      Dg Chest 1 View  Result Date: 04/04/2017 CLINICAL DATA:  Shortness of Breath EXAM: CHEST 1 VIEW COMPARISON:  06/28/2016 FINDINGS: Cardiac shadow is mildly enlarged. Postsurgical changes are now seen. Diffuse interstitial changes are again identified and stable from the prior exam. No focal infiltrate or sizable effusion is seen. No acute bony abnormality is noted. IMPRESSION: Chronic changes without acute abnormality. Electronically Signed   By: Inez Catalina M.D.   On: 04/04/2017 10:21   Ct Head Wo Contrast  Result Date: 04/06/2017 CLINICAL DATA:  Pt states she got tripped up this am and fell. Pt denies LOC. EXAM: CT HEAD WITHOUT CONTRAST TECHNIQUE: Contiguous axial images were obtained from the base of the skull through the vertex without intravenous contrast. COMPARISON:  None. FINDINGS: Brain: No intracranial hemorrhage. No parenchymal contusion. No midline shift or mass effect. Basilar cisterns are patent. No skull base fracture. No fluid in the paranasal sinuses or mastoid air cells. Orbits are normal. 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 Vascular: No hyperdense vessel or unexpected calcification. Skull: No skullbase fracture Sinuses/Orbits: Mild mucosal thickening in the maxillary sinuses. Frontal sinuses clear Other: None IMPRESSION: 1. No acute intracranial findings.  No evidence of trauma. 2. 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 Chronic sinus inflammation Electronically Signed   By: Suzy Bouchard M.D.   On: 04/06/2017 07:54   Dg Shoulder Left  Result Date: 04/06/2017 CLINICAL DATA:  [facility].  Left shoulder pain. EXAM: LEFT SHOULDER - 2+ VIEW COMPARISON:  None. FINDINGS: Left shoulder is located. The clavicle is intact. No acute bone or soft tissue abnormality is present. Cardiac enlargement and mild edema is again seen in the left hemithorax. IMPRESSION: 1. No acute abnormality of the left shoulder. 2. Cardiomegaly and edema. Electronically Signed   By: San Morelle M.D.   On: 04/06/2017 15:58    Micro Results    No results found for this or any previous visit (from the past 240 hour(s)).     Today   Subjective:   Lawrie Tunks today has no headache,no chest abdominal pain,no new weakness tingling or numbness, feels much better wants to go home today.   Objective:   Blood pressure 132/67, pulse 84, temperature 98.1 F (36.7 C), temperature source Oral, resp. rate 16, height 5\' 9"  (1.753 m), weight 105.7 kg (233 lb), SpO2 92 %.  No intake or output data in the 24 hours ending 04/10/17 1414  Exam Awake Alert, Oriented x 3, No new F.N deficits, Normal affect [place].AT,PERRAL Supple Neck,No JVD, No cervical lymphadenopathy appriciated.  Symmetrical Chest wall movement, Good air movement bilaterally, CTAB RRR,No Gallops,Rubs or new Murmurs, No Parasternal Heave +ve B.Sounds, Abd Soft, Non tender, No organomegaly appriciated, No rebound -guarding or rigidity. No Cyanosis, Clubbing or edema, No new Rash or bruise  Data Review   CBC w Diff:   Lab Results  Component Value Date   WBC 10.3 04/09/2017   HGB 8.3 (L) 04/09/2017   HCT 26.5 (L) 04/09/2017   PLT 306 04/09/2017   LYMPHOPCT 2 03/27/2017   MONOPCT 8 03/27/2017   EOSPCT 0 03/27/2017   BASOPCT 0 03/27/2017    CMP:  Lab Results  Component Value Date   NA 135 04/05/2017   K 4.0 04/05/2017   K 3.7 09/27/2014   CL 103 04/05/2017   CO2 27 04/05/2017   BUN 14 04/05/2017   CREATININE 0.88 04/09/2017   PROT 7.0 04/01/2016   ALBUMIN 3.6 04/01/2016   BILITOT 0.7 04/01/2016   ALKPHOS 43 04/01/2016   AST 68 (H) 04/01/2016   ALT 32 04/01/2016  .   Total Time in preparing paper work, data evaluation and todays exam - 40 minutes  Kaulana Brindle M.D on 04/09/2017 at 2:14 PM    Note: This dictation was prepared with Dragon dictation along with smaller phrase technology. Any transcriptional errors that result from this process are unintentional.

## 2017-05-25 ENCOUNTER — Emergency Department: Payer: Medicare Other

## 2017-05-25 ENCOUNTER — Observation Stay
Admission: EM | Admit: 2017-05-25 | Discharge: 2017-05-27 | Disposition: A | Discharge status: Home / Self Care | Payer: Medicare Other | Attending: Specialist | Admitting: Specialist

## 2017-05-25 DIAGNOSIS — G4733 Obstructive sleep apnea (adult) (pediatric): Secondary | ICD-10-CM | POA: Insufficient documentation

## 2017-05-25 DIAGNOSIS — I482 Chronic atrial fibrillation: Secondary | ICD-10-CM | POA: Diagnosis not present

## 2017-05-25 DIAGNOSIS — M7989 Other specified soft tissue disorders: Secondary | ICD-10-CM

## 2017-05-25 DIAGNOSIS — Z952 Presence of prosthetic heart valve: Secondary | ICD-10-CM | POA: Insufficient documentation

## 2017-05-25 DIAGNOSIS — E871 Hypo-osmolality and hyponatremia: Secondary | ICD-10-CM | POA: Insufficient documentation

## 2017-05-25 DIAGNOSIS — Z79899 Other long term (current) drug therapy: Secondary | ICD-10-CM | POA: Insufficient documentation

## 2017-05-25 DIAGNOSIS — N289 Disorder of kidney and ureter, unspecified: Secondary | ICD-10-CM | POA: Insufficient documentation

## 2017-05-25 DIAGNOSIS — I5033 Acute on chronic diastolic (congestive) heart failure: Secondary | ICD-10-CM | POA: Diagnosis not present

## 2017-05-25 DIAGNOSIS — Z7982 Long term (current) use of aspirin: Secondary | ICD-10-CM | POA: Insufficient documentation

## 2017-05-25 DIAGNOSIS — E669 Obesity, unspecified: Secondary | ICD-10-CM | POA: Insufficient documentation

## 2017-05-25 DIAGNOSIS — R32 Unspecified urinary incontinence: Secondary | ICD-10-CM | POA: Diagnosis not present

## 2017-05-25 DIAGNOSIS — Z888 Allergy status to other drugs, medicaments and biological substances status: Secondary | ICD-10-CM | POA: Insufficient documentation

## 2017-05-25 DIAGNOSIS — Z7984 Long term (current) use of oral hypoglycemic drugs: Secondary | ICD-10-CM | POA: Insufficient documentation

## 2017-05-25 DIAGNOSIS — Z6835 Body mass index (BMI) 35.0-35.9, adult: Secondary | ICD-10-CM | POA: Insufficient documentation

## 2017-05-25 DIAGNOSIS — Z7901 Long term (current) use of anticoagulants: Secondary | ICD-10-CM | POA: Insufficient documentation

## 2017-05-25 DIAGNOSIS — J449 Chronic obstructive pulmonary disease, unspecified: Secondary | ICD-10-CM | POA: Diagnosis not present

## 2017-05-25 DIAGNOSIS — G2581 Restless legs syndrome: Secondary | ICD-10-CM | POA: Insufficient documentation

## 2017-05-25 DIAGNOSIS — R55 Syncope and collapse: Secondary | ICD-10-CM | POA: Diagnosis present

## 2017-05-25 DIAGNOSIS — S0990XA Unspecified injury of head, initial encounter: Secondary | ICD-10-CM | POA: Diagnosis not present

## 2017-05-25 DIAGNOSIS — E119 Type 2 diabetes mellitus without complications: Secondary | ICD-10-CM | POA: Insufficient documentation

## 2017-05-25 DIAGNOSIS — I951 Orthostatic hypotension: Principal | ICD-10-CM | POA: Insufficient documentation

## 2017-05-25 DIAGNOSIS — K219 Gastro-esophageal reflux disease without esophagitis: Secondary | ICD-10-CM | POA: Diagnosis not present

## 2017-05-25 DIAGNOSIS — W19XXXA Unspecified fall, initial encounter: Secondary | ICD-10-CM | POA: Insufficient documentation

## 2017-05-25 DIAGNOSIS — R001 Bradycardia, unspecified: Secondary | ICD-10-CM | POA: Insufficient documentation

## 2017-05-25 DIAGNOSIS — I9589 Other hypotension: Secondary | ICD-10-CM

## 2017-05-25 DIAGNOSIS — M1990 Unspecified osteoarthritis, unspecified site: Secondary | ICD-10-CM | POA: Diagnosis not present

## 2017-05-25 LAB — TROPONIN I
TROPONIN I: 0.03 ng/mL — AB (ref <0.03)
Troponin I: <0.03 ng/mL (ref <0.03)

## 2017-05-25 LAB — PROTIME-INR
INR: 1.98
PROTHROMBIN TIME: 22.8 s — AB (ref 11.4–15.2)

## 2017-05-25 LAB — BASIC METABOLIC PANEL
ANION GAP: 13 (ref 5–15)
BUN: 60 mg/dL — ABNORMAL HIGH (ref 6–20)
CALCIUM: 8.4 mg/dL — AB (ref 8.9–10.3)
CO2: 27 mmol/L (ref 22–32)
Chloride: 88 mmol/L — ABNORMAL LOW (ref 101–111)
Creatinine, Ser: 1.52 mg/dL — ABNORMAL HIGH (ref 0.44–1.00)
GFR, EST AFRICAN AMERICAN: 39 mL/min — AB (ref >60)
GFR, EST NON AFRICAN AMERICAN: 34 mL/min — AB (ref >60)
Glucose, Bld: 188 mg/dL — ABNORMAL HIGH (ref 65–99)
Potassium: 3.8 mmol/L (ref 3.5–5.1)
SODIUM: 128 mmol/L — AB (ref 135–145)

## 2017-05-25 LAB — CBC
HCT: 27.8 % — ABNORMAL LOW (ref 35.0–47.0)
HEMOGLOBIN: 8.6 g/dL — AB (ref 12.0–16.0)
MCH: 22.1 pg — AB (ref 26.0–34.0)
MCHC: 30.8 g/dL — ABNORMAL LOW (ref 32.0–36.0)
MCV: 71.8 fL — ABNORMAL LOW (ref 80.0–100.0)
Platelets: 330 10*3/uL (ref 150–440)
RBC: 3.88 MIL/uL (ref 3.80–5.20)
RDW: 21.6 % — ABNORMAL HIGH (ref 11.5–14.5)
WBC: 13.6 10*3/uL — AB (ref 3.6–11.0)

## 2017-05-25 LAB — GLUCOSE, CAPILLARY
GLUCOSE-CAPILLARY: 194 mg/dL — AB (ref 65–99)
Glucose-Capillary: 183 mg/dL — ABNORMAL HIGH (ref 65–99)

## 2017-05-25 LAB — BRAIN NATRIURETIC PEPTIDE: B NATRIURETIC PEPTIDE 5: 403 pg/mL — AB (ref 0.0–100.0)

## 2017-05-25 LAB — APTT: APTT: 32 s (ref 24–36)

## 2017-05-25 MED ORDER — ASPIRIN EC 81 MG PO TBEC
81.0000 mg | DELAYED_RELEASE_TABLET | Freq: Every day | ORAL | Status: DC
  Administered 2017-05-26 – 2017-05-27 (×2): 81 mg via ORAL
  Filled 2017-05-25 (×2): qty 1

## 2017-05-25 MED ORDER — VENLAFAXINE HCL ER 75 MG PO CP24
225.0000 mg | ORAL_CAPSULE | Freq: Every day | ORAL | Status: DC
  Administered 2017-05-26 – 2017-05-27 (×2): 225 mg via ORAL
  Filled 2017-05-25 (×2): qty 3

## 2017-05-25 MED ORDER — FERROUS SULFATE 325 (65 FE) MG PO TABS
325.0000 mg | ORAL_TABLET | Freq: Every day | ORAL | Status: DC
  Administered 2017-05-26 – 2017-05-27 (×2): 325 mg via ORAL
  Filled 2017-05-25 (×2): qty 1

## 2017-05-25 MED ORDER — ATORVASTATIN CALCIUM 20 MG PO TABS
20.0000 mg | ORAL_TABLET | Freq: Every day | ORAL | Status: DC
  Administered 2017-05-26 – 2017-05-27 (×2): 20 mg via ORAL
  Filled 2017-05-25 (×2): qty 1

## 2017-05-25 MED ORDER — SODIUM CHLORIDE 0.9 % IV SOLN
INTRAVENOUS | Status: DC
  Administered 2017-05-25: 23:00:00 via INTRAVENOUS

## 2017-05-25 MED ORDER — METAXALONE 400 MG HALF TABLET
400.0000 mg | ORAL_TABLET | Freq: Three times a day (TID) | ORAL | Status: DC | PRN
  Filled 2017-05-25: qty 1

## 2017-05-25 MED ORDER — ACETAMINOPHEN 650 MG RE SUPP: 650.0000 mg | Freq: Four times a day (QID) | RECTAL | Status: DC | PRN

## 2017-05-25 MED ORDER — ONDANSETRON HCL 4 MG PO TABS: 4.0000 mg | ORAL_TABLET | Freq: Four times a day (QID) | ORAL | Status: DC | PRN

## 2017-05-25 MED ORDER — AZATHIOPRINE 50 MG PO TABS
100.0000 mg | ORAL_TABLET | Freq: Every day | ORAL | Status: DC
  Administered 2017-05-25 – 2017-05-27 (×3): 100 mg via ORAL
  Filled 2017-05-25 (×3): qty 2

## 2017-05-25 MED ORDER — FLUTICASONE PROPIONATE 50 MCG/ACT NA SUSP
1.0000 | Freq: Every day | NASAL | Status: DC
  Administered 2017-05-25 – 2017-05-27 (×3): 1 via NASAL
  Filled 2017-05-25: qty 16

## 2017-05-25 MED ORDER — TRAZODONE HCL 100 MG PO TABS
200.0000 mg | ORAL_TABLET | Freq: Every day | ORAL | Status: DC
  Administered 2017-05-25 – 2017-05-26 (×2): 200 mg via ORAL
  Filled 2017-05-25 (×2): qty 2

## 2017-05-25 MED ORDER — PROPAFENONE HCL 225 MG PO TABS
225.0000 mg | ORAL_TABLET | Freq: Two times a day (BID) | ORAL | Status: DC
  Administered 2017-05-25 – 2017-05-27 (×4): 225 mg via ORAL
  Filled 2017-05-25 (×5): qty 1

## 2017-05-25 MED ORDER — CALCIUM CARBONATE-VITAMIN D 500-200 MG-UNIT PO TABS
1.0000 | ORAL_TABLET | Freq: Every day | ORAL | Status: DC
  Administered 2017-05-26 – 2017-05-27 (×2): 1 via ORAL
  Filled 2017-05-25 (×2): qty 1

## 2017-05-25 MED ORDER — OXYBUTYNIN CHLORIDE 5 MG PO TABS
5.0000 mg | ORAL_TABLET | Freq: Two times a day (BID) | ORAL | Status: DC
  Administered 2017-05-25 – 2017-05-27 (×4): 5 mg via ORAL
  Filled 2017-05-25 (×5): qty 1

## 2017-05-25 MED ORDER — ACETAMINOPHEN 325 MG PO TABS
650.0000 mg | ORAL_TABLET | Freq: Four times a day (QID) | ORAL | Status: DC | PRN
  Administered 2017-05-27: 650 mg via ORAL
  Filled 2017-05-25: qty 2

## 2017-05-25 MED ORDER — SODIUM CHLORIDE 0.9 % IV BOLUS (SEPSIS)
1000.0000 mL | Freq: Once | INTRAVENOUS | Status: AC
  Administered 2017-05-25: 1000 mL via INTRAVENOUS

## 2017-05-25 MED ORDER — WARFARIN - PHARMACIST DOSING INPATIENT
Freq: Every day | Status: DC
  Filled 2017-05-25 (×3): qty 1

## 2017-05-25 MED ORDER — WARFARIN SODIUM 5 MG PO TABS: 5.0000 mg | ORAL_TABLET | Freq: Every day | ORAL | Status: DC

## 2017-05-25 MED ORDER — ROPINIROLE HCL 1 MG PO TABS
4.0000 mg | ORAL_TABLET | Freq: Every day | ORAL | Status: DC
  Administered 2017-05-25 – 2017-05-26 (×2): 4 mg via ORAL
  Filled 2017-05-25 (×2): qty 4

## 2017-05-25 MED ORDER — ONDANSETRON HCL 4 MG/2ML IJ SOLN: 4.0000 mg | Freq: Four times a day (QID) | INTRAMUSCULAR | Status: DC | PRN

## 2017-05-25 MED ORDER — HYDROCODONE-ACETAMINOPHEN 5-325 MG PO TABS
1.0000 | ORAL_TABLET | Freq: Three times a day (TID) | ORAL | Status: DC | PRN
  Administered 2017-05-25: 1 via ORAL
  Filled 2017-05-25: qty 1

## 2017-05-25 MED ORDER — WARFARIN SODIUM 5 MG PO TABS
7.5000 mg | ORAL_TABLET | Freq: Once | ORAL | Status: AC
  Administered 2017-05-25: 7.5 mg via ORAL
  Filled 2017-05-25: qty 1

## 2017-05-25 MED ORDER — METFORMIN HCL 500 MG PO TABS
1000.0000 mg | ORAL_TABLET | Freq: Two times a day (BID) | ORAL | Status: DC
  Administered 2017-05-26: 1000 mg via ORAL
  Filled 2017-05-25: qty 2

## 2017-05-25 MED ORDER — GABAPENTIN 600 MG PO TABS
600.0000 mg | ORAL_TABLET | Freq: Two times a day (BID) | ORAL | Status: DC
  Administered 2017-05-25 – 2017-05-27 (×4): 600 mg via ORAL
  Filled 2017-05-25 (×4): qty 1

## 2017-05-25 MED ORDER — WARFARIN SODIUM 5 MG PO TABS: 7.5000 mg | ORAL_TABLET | Freq: Every day | ORAL | Status: DC

## 2017-05-25 NOTE — ED Triage Notes (Signed)
Pt reports not feeling well today, states went to visit her mom at the nursing home, pt states that when she stood up she became very dizzy and the room began to spin and she blacked out, family reports that she hit her head on the floor, pt reports taking coumadin and denies neck pain, pt states that she cont to be dizzy and states that she feels weak.

## 2017-05-25 NOTE — Progress Notes (Signed)
Patient is admitted to room 258 with the diagnosis of syncope. Alert and oriented x 4 Tele box called to CCMD with Betty Imus RN as a second verifier. Skin assessment done with her as well, no skin issues to report. Password was set up. Safety risk reviewed and patient signed the contract. Patient requested for medication for sinus drainage and received Flonase per DR. Willis order. Initiated isolation for contact precaution for ESBL in the urine.  Will continue to monitor

## 2017-05-25 NOTE — ED Notes (Signed)
Admitting MD at bedside.

## 2017-05-25 NOTE — ED Notes (Addendum)
FIRST NURSE NOTE: Pt had syncopal episode today while visiting family at a SNF.  Pt states she is feeling dizzy even while sitting down. Pt assisted out of the car and placed in a wheelchair. Pt fell and hit head, knot present on the back of head.

## 2017-05-25 NOTE — ED Notes (Addendum)
Patient transported to X-ray and CT 

## 2017-05-25 NOTE — ED Notes (Signed)
First call report att to 2A, per secretary all RN's in bedside report and unavailable to receive report

## 2017-05-25 NOTE — ED Provider Notes (Signed)
[facility] Emergency Department Provider Note  ____________________________________________  Time seen: Approximately 3:38 PM  I have reviewed the triage vital signs and the nursing notes.   HISTORY  Chief Complaint Loss of Consciousness    HPI Betty [name] is a 71 y.o. female with a history of A. fib on Coumadin, diastolic CHF, DM, HTN, presenting with syncope. The patient was visiting her mother at an assisted living facility when she became lightheaded and had a syncopal episode with standing. Her blood pressure was checked "it was real low," however the actual numbers are unclear.The patient reports that she was recently started on Lopressor, and since then, has had lightheadedness with standing in decreased energy, general malaise. She is also felt more short of breath than baseline. She also notes left lower extremity swelling, particularly around the ankle. She has not had any nausea or vomiting, cough or cold symptoms, fever or chills. She has had intermittent dysuria.   Past Medical History:  Diagnosis Date  . Acute diastolic heart failure ([place])   . Allergy   . 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 Asthma   . Atrial fibrillation ([place])   . Backache, unspecified   . Cardiomegaly   . COPD (chronic obstructive pulmonary disease) ([place])   . Diabetes mellitus without complication ([place])   . 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 Esophageal reflux   . Headache(784.0)   . Herpes zoster without mention of complication   . Hx: UTI (urinary tract infection)   . Hypertension    heart controlled w CHF  . Nontoxic uninodular goiter   . Obesity, unspecified   . Osteoarthrosis, unspecified whether generalized or localized, unspecified site   . Unspecified sleep apnea   . Urine incontinence    hx of    Patient Active Problem List   Diagnosis Date Noted  . Postprocedural hemorrhage due to complication of oral surgery 03/27/2017   . Acute blood loss anemia 03/27/2017  . H/O mitral valve replacement with mechanical valve 03/27/2017  . Syncope 06/25/2016  . UTI (lower urinary tract infection) 06/25/2016  . A-fib ([place]) 05/10/2016  . Dyspnea 05/10/2016  . Acute diastolic CHF (congestive heart failure) ([place]) 05/10/2016  . Anemia 05/10/2016  . Anemia 05/10/2016  . Other specified abnormal immunological findings in serum 04/24/2016  . Sepsis ([place]) 04/01/2016  . Prolonged Q-T interval on ECG 03/13/2016  . Mitral stenosis 03/13/2016  . Essential hypertension, benign 03/13/2016  . Diastolic congestive heart failure ([place]) 03/13/2016  . Interstitial lung disease ([place]) 12/29/2015  . Chronic LBP 07/26/2015  . Essential (primary) hypertension 07/26/2015  . Idiopathic insomnia 07/26/2015  . Restless leg 07/26/2015  . Obesity (BMI 30-39.9) 01/07/2015  . Abnormal EKG 09/11/2014  . External nasal lesion 02/26/2014  . Abnormal liver function tests 01/28/2014  . Ankle pain, right 01/28/2014  . Arthralgia of ankle or foot 01/28/2014  . Urinary frequency 12/24/2013  . Hemorrhoids 12/24/2013  . Neck pain on right side 11/10/2013  . Vitamin D deficiency 09/09/2013  . Obstructive sleep apnea 05/30/2013  . Urinary incontinence 05/30/2013  . Diabetes ([place]) 05/30/2013  . History of colonic polyps 05/30/2013  . Controlled type 2 diabetes mellitus without complication ([place]) 39/76/7341  . H/O deep venous thrombosis 05/05/2013  . Depression 04/13/2009  . THYROID NODULE 04/13/2009  . 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 GERD 04/13/2009  . OSTEOARTHRITIS 04/13/2009  . BACK PAIN 04/13/2009  . HEADACHE 04/13/2009  . Gastro-esophageal reflux  disease without esophagitis 04/13/2009  . Mild episode of recurrent major depressive disorder ([place]) 04/13/2009  . 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 ATRIAL FIBRILLATION 03/28/2009  . DIASTOLIC HEART FAILURE, ACUTE 03/28/2009  . VENTRICULAR  HYPERTROPHY, LEFT 03/28/2009    Past Surgical History:  Procedure Laterality Date  . ABDOMINAL HYSTERECTOMY  1979   complete (for precancerous cells)  . ABLATION  2011 & 2014  . CHOLECYSTECTOMY    . OOPHORECTOMY      Current Outpatient Rx  . Order #: [id number] Class: Normal  . Order #: [id number] Class: Normal  . Order #: [id number] Class: Historical Med  . Order #: [id number] Class: Historical Med  . Order #: [id number] Class: Historical Med  . Order #: [id number] Class: Normal  . Order #: [id number] Class: Historical Med  . Order #: [id number] Class: Historical Med  . Order #: [id number] Class: Historical Med  . Order #: [id number] Class: Historical Med  . Order #: [id number] Class: Normal  . Order #: [id number] Class: Historical Med  . Order #: [id number] Class: Historical Med  . Order #: [id number] Class: Print  . Order #: [id number] Class: Normal  . Order #: [id number] Class: Historical Med  . Order #: [id number] Class: Historical Med  . Order #: [id number] Class: Normal  . Order #: [id number] Class: Historical Med  . Order #: [id number] Class: Historical Med  . Order #: [id number] Class: Normal  . Order #: [id number] Class: Historical Med  . Order #: [id number] Class: Normal  . Order #: [id number] Class: Historical Med  . Order #: [id number] Class: Historical Med  . Order #: [id number] Class: Historical Med  . Order #: [id number] Class: Historical Med  . Order #: [id number] Class: Historical Med  . Order #: [id number] Class: Historical Med    Allergies Amiodarone  Family History  Problem Relation Age of Onset  . Heart attack Father   . Heart failure Father   . Arthritis Father   . Stroke Father   . Hypertension Father   . Coronary artery disease Brother   . Peripheral vascular disease Brother   . Arthritis Mother   . Colon cancer Mother        colon cancer  . Hypertension Mother   . Cancer Maternal Grandmother        colon cancer  . Arthritis Maternal Grandmother     Social History Social History   Substance Use Topics  . Smoking status: Former Smoker    Packs/day: 0.50    Years: 15.00    Types: Cigarettes  . Smokeless tobacco: Never Used     Comment: Has a 20-pack-year history, qutting in 1970.   Marland Kitchen Alcohol use No    Review of Systems Constitutional: No fever/chills.Positive lightheadedness or syncope. Positive generalized malaise. Positive decreased exercise tolerance. Eyes: No visual changes. HEAD: Positive contusion to the posterior scalp. ENT: No sore throat. No congestion or rhinorrhea. Cardiovascular: Denies chest pain. Denies palpitations. Respiratory: Positive shortness of breath.  No cough. Gastrointestinal: No abdominal pain.  No nausea, no vomiting.  No diarrhea.  No constipation. Genitourinary: Negative for dysuria. Musculoskeletal: Negative for back pain. Positive left lower extremity swelling without calf pain. Skin: Negative for rash. Neurological: Negative for headaches. No focal numbness, tingling or weakness. No changes in vision or speech. No confusion. No seizure activity.    ____________________________________________   PHYSICAL EXAM:  VITAL SIGNS: ED Triage Vitals  Enc Vitals Group     BP 05/25/17 1512 119/86     Pulse Rate 05/25/17 1512 (!) 55     Resp 05/25/17 1512 18     Temp 05/25/17 1512  98 F (36.7 C)     Temp Source 05/25/17 1512 Oral     SpO2 05/25/17 1512 95 %     Weight 05/25/17 1513 240 lb (108.9 kg)     Height 05/25/17 1513 5\' 9"  (1.753 m)     Head Circumference --      Peak Flow --      Pain Score 05/25/17 1512 6     Pain Loc --      Pain Edu? --      Excl. in [place]? --     Constitutional: Alert and oriented. Chronically ill appearing but in no acute distress. Answers questions appropriately. GCS is 15. Eyes: Conjunctivae are normal.  EOMI. No scleral icterus. Head: 3 x 3" soft tissue swelling on the posterior scalp without can break. No Battle sign. No raccoon eyes.. Nose: No congestion/rhinnorhea. No swelling over the  nose. No septal hematoma. Mouth/Throat: Mucous membranes are dry. No dental injury or malocclusion. Neck: No stridor.  Supple.  No JVD. No midline C-spine tenderness to palpation, step-offs or deformities. Full range of motion without pain. Cardiovascular: Slow rate, regular rhythm. No murmurs, rubs or gallops.  Respiratory: Normal respiratory effort.  No accessory muscle use or retractions. Lungs CTAB.  No wheezes, rales or ronchi. Gastrointestinal: Obese. Soft, nontender and nondistended.  No guarding or rebound.  No peritoneal signs. Musculoskeletal: Positive left greater than right lower extremity edema that is pitting just to the ankle. No ttp in the calves or palpable cords.  Negative Homan's sign. Neurologic:  A&Ox3.  Speech is clear.  Face and smile are symmetric.  EOMI.  PERRLA. Moves all extremities well. Skin:  Skin is warm, dry and intact. No rash noted. Psychiatric: Mood and affect are normal. Speech and behavior are normal.  Normal judgement.  ____________________________________________   LABS (all labs ordered are listed, but only abnormal results are displayed)  Labs Reviewed  GLUCOSE, CAPILLARY - Abnormal; Notable for the following:       Result Value   Glucose-Capillary 183 (*)    All other components within normal limits  BASIC METABOLIC PANEL  CBC  URINALYSIS, COMPLETE (UACMP) WITH MICROSCOPIC  TROPONIN I  BRAIN NATRIURETIC PEPTIDE  PROTIME-INR  APTT  CBG MONITORING, ED   ____________________________________________  EKG  ED ECG REPORT I, Eula Listen, the attending physician, personally viewed and interpreted this ECG.   Date: 05/25/2017  EKG Time: 1510  Rate: 55  Rhythm: sinus bradycardia  Axis: leftward  Intervals:prolonged QTc  ST&T Change: Nonspecific T-wave inversion in V1. No ST elevation. 0.5 mm ST depression in V3, V4, and V5. This ST depression is unchanged from EKG 4/18, but the bradycardia is  new.  ____________________________________________  RADIOLOGY  Dg Chest 2 View  Result Date: 05/25/2017 CLINICAL DATA:  Syncope. EXAM: CHEST  2 VIEW COMPARISON:  04/04/2017 and prior exams FINDINGS: Cardiomegaly, cardiac valve replacement and chronic interstitial opacities again noted. There is no evidence of focal airspace disease, pulmonary edema, suspicious pulmonary nodule/mass, pleural effusion, or pneumothorax. No acute bony abnormalities are identified. IMPRESSION: Cardiomegaly without evidence of acute cardiopulmonary disease. Chronic interstitial lung disease. Electronically Signed   By: Margarette Canada M.D.   On: 05/25/2017 15:54   Ct Head Wo Contrast  Result Date: 05/25/2017 CLINICAL DATA:  Patient status post fall.  Patient on Coumadin. EXAM: CT HEAD WITHOUT CONTRAST TECHNIQUE: Contiguous axial images were obtained from the base of the skull through the vertex without intravenous contrast. COMPARISON:  Brain CT 04/06/2017 FINDINGS: Brain:  Ventricles and sulci are appropriate for patient's age. Remote right caudate infarct. No evidence for acute cortically based infarct, intracranial hemorrhage, mass lesion or mass-effect. Vascular: Internal carotid arterial vascular calcifications. Skull: Intact. Sinuses/Orbits: Paranasal sinuses are well aerated. Mild mucosal thickening. Mastoid air cells are unremarkable. The orbits are unremarkable. Other: Soft tissue swelling overlying the posterior left calvarium. IMPRESSION: No acute intracranial process. Chronic microvascular ischemic changes. Electronically Signed   By: Lovey Newcomer M.D.   On: 05/25/2017 15:44    ____________________________________________   PROCEDURES  Procedure(s) performed: None  Procedures  Critical Care performed: No ____________________________________________   INITIAL IMPRESSION / ASSESSMENT AND PLAN / ED COURSE  Pertinent labs & imaging results that were available during my care of the patient were reviewed by me  and considered in my medical decision making (see chart for details).  71 y.o. female recently started on Lopressor with bradycardia and hypotension after a syncopal event after standing. Overall, the patient's trauma evaluation is negative and the skin does not show any acute intracranial injury. She has no other obvious injury from her fall. In terms of her syncope event, I am concerned that she has symptomatic bradycardia, likely iatrogenic from her new beta blocker. Given that she has been having shortness of breath, will also evaluate for acute CHF exacerbation, and get a troponin for atypical ACS or MI. She does have swelling in the left lower extremity, but no tachycardia or hypoxia, so I would consider PE but this is less likely than iatrogenic bradycardia.  The patient also appears mildly dehydrated with dry mucous membranes. This could also cause hypotension and syncope. We will get orthostatic vital signs. We will check her for UTI given her recent dysuria. Plan admission for continued treatment and evaluation. ____________________________________________  FINAL CLINICAL IMPRESSION(S) / ED DIAGNOSES  Final diagnoses:  Syncope, unspecified syncope type  Symptomatic bradycardia  Other specified hypotension  Left leg swelling  Injury of head, initial encounter         NEW MEDICATIONS STARTED DURING THIS VISIT:  New Prescriptions   No medications on file      Eula Listen, MD 05/25/17 [id number]

## 2017-05-25 NOTE — ED Notes (Addendum)
MD [facility] aware of orthostatic vitals obtained. No fluid ordered at this time due to Pt hx.

## 2017-05-25 NOTE — H&P (Signed)
[facility] [place] NAME: Betty [name]    MR#:  [id number]  DATE OF BIRTH:  January 20, 1946  DATE OF ADMISSION:  05/25/2017  PRIMARY CARE PHYSICIAN: Dion Body, MD   REQUESTING/REFERRING PHYSICIAN: Dr. Amedeo Plenty  CHIEF COMPLAINT:   Chief Complaint  Patient presents with  . Loss of Consciousness    HISTORY OF PRESENT ILLNESS:  Betty [name]  is a 71 y.o. female with With multiple medical problems including ANCA associated vasculitis, interstitial lung disease following with rheumatology, chronic COPD currently not on home oxygen, untreated sleep apnea, atrial fibrillation status post ablations in the past, history of mechanical mitral valve replacement surgery on Coumadin, recent admission for anemia requiring transfusion, hypertension, diabetes mellitus, diastolic CHF presents to the hospital after a syncopal episode. Patient was visiting her mother today, has been feeling weak and tired for the last several days. When she woke up this morning she was sick to her stomach and didn't feel really well. While she was walking out after visiting her mother from a facility she felt dizzy and passed out for a few seconds. Denies any chest pain or order prior to the fall. No seizure-like activity described by her more who witnessed the fall. She did hit her head and has a small bruise. She was noted to be hypotensive in the emergency room. Seen by her cardiologist about a month ago and has been on Diuretics for her CHF. Decreased oral intake for the last couple of days. Hemoglobin is stable today. Labs reveal acute renal failure and hyponatremia.  PAST MEDICAL HISTORY:   Past Medical History:  Diagnosis Date  . Acute diastolic heart failure ([place])   . Allergy   . ANCA-associated vasculitis ([place])   . Asthma   . Atrial fibrillation ([place])   . Backache, unspecified   . Cardiomegaly   . COPD (chronic obstructive pulmonary disease) ([place])   .  Diabetes mellitus without complication ([place])   . 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 Esophageal reflux   . Headache(784.0)   . Herpes zoster without mention of complication   . Hx: UTI (urinary tract infection)   . Hypertension    heart controlled w CHF  . Nontoxic uninodular goiter   . Obesity, unspecified   . Osteoarthrosis, unspecified whether generalized or localized, unspecified site   . Unspecified sleep apnea   . Urine incontinence    hx of    PAST SURGICAL HISTORY:   Past Surgical History:  Procedure Laterality Date  . ABDOMINAL HYSTERECTOMY  1979   complete (for precancerous cells)  . ABLATION  2011 & 2014  . CHOLECYSTECTOMY    . OOPHORECTOMY      SOCIAL HISTORY:   Social History  Substance Use Topics  . Smoking status: Former Smoker    Packs/day: 0.50    Years: 15.00    Types: Cigarettes  . Smokeless tobacco: Never Used     Comment: Has a 20-pack-year history, qutting in 1970.   Marland Kitchen Alcohol use No    FAMILY HISTORY:   Family History  Problem Relation Age of Onset  . Heart attack Father   . Heart failure Father   . Arthritis Father   . Stroke Father   . Hypertension Father   . Coronary artery disease Brother   . Peripheral vascular disease Brother   . Arthritis Mother   . Colon cancer Mother  colon cancer  . Hypertension Mother   . Cancer Maternal Grandmother        colon cancer  . Arthritis Maternal Grandmother     DRUG ALLERGIES:   Allergies  Allergen Reactions  . Amiodarone Other (See Comments)    Pt states that this medication causes lung bleeding.      REVIEW OF SYSTEMS:   Review of Systems  Constitutional: Positive for malaise/fatigue. Negative for chills, fever and weight loss.  HENT: Negative for ear discharge, ear pain, hearing loss and nosebleeds.   Eyes: Negative for blurred vision, double vision and photophobia.  Respiratory: Positive for shortness of breath. Negative for cough,  hemoptysis and wheezing.   Cardiovascular: Positive for leg swelling. Negative for chest pain, palpitations and orthopnea.  Gastrointestinal: Negative for abdominal pain, constipation, diarrhea, heartburn, melena, nausea and vomiting.  Genitourinary: Negative for dysuria, frequency, hematuria and urgency.  Musculoskeletal: Negative for back pain, myalgias and neck pain.  Skin: Negative for rash.  Neurological: Negative for dizziness, sensory change, speech change, focal weakness and headaches.  Endo/Heme/Allergies: Does not bruise/bleed easily.  Psychiatric/Behavioral: Negative for depression.    MEDICATIONS AT HOME:   Prior to Admission medications   Medication Sig Start Date End Date Taking? Authorizing Provider  albuterol (PROVENTIL HFA;VENTOLIN HFA) 108 (90 Base) MCG/ACT inhaler Inhale 2 puffs into the lungs every 4 (four) hours as needed for wheezing or shortness of breath. 01/17/16  Yes Kasa, Maretta Bees, MD  albuterol (PROVENTIL) (2.5 MG/3ML) 0.083% nebulizer solution Take 3 mLs (2.5 mg total) by nebulization every 4 (four) hours as needed for wheezing or shortness of breath. 06/28/16  Yes Gladstone Lighter, MD  aspirin EC 81 MG tablet Take 81 mg by mouth daily.   Yes [provider]  atorvastatin (LIPITOR) 20 MG tablet Take 20 mg by mouth daily.   Yes [provider]  calcium-vitamin D (OSCAL WITH D) 500-200 MG-UNIT per tablet Take 1 tablet by mouth daily.   Yes [provider]  diltiazem (CARDIZEM LA) 180 MG 24 hr tablet Take 180 mg by mouth daily.  06/17/16  Yes [provider]  ferrous sulfate 325 (65 FE) MG tablet Take 325 mg by mouth daily with breakfast.   Yes [provider]  metoprolol tartrate (LOPRESSOR) 25 MG tablet Take 0.5 tablets (12.5 mg total) by mouth 2 (two) times daily. 04/09/17  Yes Epifanio Lesches, MD  potassium chloride (K-DUR) 10 MEQ tablet Take 2 tablets (20 mEq total) by mouth daily. While on lasix 06/27/16  Yes Gladstone Lighter, MD  rOPINIRole (REQUIP) 2 MG tablet Take 1 tablet (2 mg total) by mouth at bedtime as needed (restless leg syndrome). Patient taking differently: Take 4 mg by mouth at bedtime.  04/09/17  Yes Epifanio Lesches, MD  spironolactone (ALDACTONE) 50 MG tablet Take 50 mg by mouth daily.   Yes [provider]  torsemide (DEMADEX) 20 MG tablet Take 80 mg by mouth 2 (two) times daily.    Yes [provider]  traZODone (DESYREL) 100 MG tablet Take 200 mg by mouth at bedtime.   Yes [provider]  Venlafaxine HCl 225 MG TB24 Take 225 mg by mouth daily.   Yes [provider]  warfarin (COUMADIN) 5 MG tablet Take 5 mg by mouth daily. 5mg  everyday except THURSDAY   Yes [provider]  warfarin (COUMADIN) 7.5 MG tablet Take 7.5 mg by mouth daily. ONLY on thursday   Yes [provider]  azaTHIOprine (IMURAN) 50 MG tablet  Take 100 mg by mouth daily.     [provider]  gabapentin (NEURONTIN) 600 MG tablet Take 600 mg by mouth 2 (two) times daily.    [provider]  HYDROcodone-acetaminophen (NORCO/VICODIN) 5-325 MG tablet Take 1 tablet by mouth every 8 (eight) hours as needed for moderate pain. 04/09/17   Epifanio Lesches, MD  metaxalone (SKELAXIN) 400 MG tablet Take 1 tablet (400 mg total) by mouth 3 (three) times daily as needed for muscle spasms. 04/09/17   Epifanio Lesches, MD  metFORMIN (GLUCOPHAGE) 1000 MG tablet Take 1,000 mg by mouth 2 (two) times daily with a meal.     [provider]  metolazone (ZAROXOLYN) 2.5 MG tablet Take 2.5 mg by mouth 2 (two) times a week. prn    [provider]  oxybutynin (DITROPAN) 5 MG tablet Take 5 mg by mouth 2 (two) times daily.    [provider]  pantoprazole (PROTONIX) 40 MG tablet Take 40 mg by mouth daily.    [provider]  propafenone (RYTHMOL) 225 MG tablet Take 225 mg by mouth every 12 (twelve) hours.    [provider]       VITAL SIGNS:  Blood pressure (!) 82/49, pulse (!) 54, temperature 98 F (36.7 C), temperature source Oral, resp. rate 19, height 5\' 9"  (1.753 m), weight 108.9 kg (240 lb), SpO2 94 %.  PHYSICAL EXAMINATION:   Physical Exam  GENERAL:  71 y.o.-year-old patient lying in the bed with no acute distress.  EYES: Pupils equal, round, reactive to light and accommodation. No scleral icterus. Extraocular muscles intact.  HEENT: Head atraumatic, normocephalic. Oropharynx and nasopharynx clear.  NECK:  Supple, no jugular venous distention. No thyroid enlargement, no tenderness.  LUNGS: Normal breath sounds bilaterally, no wheezing,rhonchi or crepitation. No use of accessory muscles of respiration. Bibasilar crackles noted. CARDIOVASCULAR: S1, S2 normal. No murmurs, rubs, or gallops.  ABDOMEN: Soft, nontender, nondistended. Bowel sounds present. No organomegaly or mass.  EXTREMITIES: 2+ bilateral pedal edema noted. No cyanosis, or clubbing.  NEUROLOGIC: Cranial nerves II through XII are intact. Muscle strength 5/5 in all extremities. Sensation intact. Gait not checked.  PSYCHIATRIC: The patient is alert and oriented x 3.  SKIN: No obvious rash, lesion, or ulcer.   LABORATORY PANEL:   CBC  Recent Labs Lab 05/25/17 1609  WBC 13.6*  HGB 8.6*  HCT 27.8*  PLT 330   ------------------------------------------------------------------------------------------------------------------  Chemistries   Recent Labs Lab 05/25/17 1609  NA 128*  K 3.8  CL 88*  CO2 27  GLUCOSE 188*  BUN 60*  CREATININE 1.52*  CALCIUM 8.4*   ------------------------------------------------------------------------------------------------------------------  Cardiac Enzymes  Recent Labs Lab 05/25/17 1609  TROPONINI 0.03*   ------------------------------------------------------------------------------------------------------------------  RADIOLOGY:  Dg Chest 2 View  Result Date: 05/25/2017 CLINICAL DATA:   Syncope. EXAM: CHEST  2 VIEW COMPARISON:  04/04/2017 and prior exams FINDINGS: Cardiomegaly, cardiac valve replacement and chronic interstitial opacities again noted. There is no evidence of focal airspace disease, pulmonary edema, suspicious pulmonary nodule/mass, pleural effusion, or pneumothorax. No acute bony abnormalities are identified. IMPRESSION: Cardiomegaly without evidence of acute cardiopulmonary disease. Chronic interstitial lung disease. Electronically Signed   By: Margarette Canada M.D.   On: 05/25/2017 15:54   Ct Head Wo Contrast  Result Date: 05/25/2017 CLINICAL DATA:  Patient status post fall.  Patient on Coumadin. EXAM: CT HEAD WITHOUT CONTRAST TECHNIQUE: Contiguous axial images were obtained from the base of the skull through the vertex without intravenous contrast. COMPARISON:  Brain CT  04/06/2017 FINDINGS: Brain: Ventricles and sulci are appropriate for patient's age. Remote right caudate infarct. No evidence for acute cortically based infarct, intracranial hemorrhage, mass lesion or mass-effect. Vascular: Internal carotid arterial vascular calcifications. Skull: Intact. Sinuses/Orbits: Paranasal sinuses are well aerated. Mild mucosal thickening. Mastoid air cells are unremarkable. The orbits are unremarkable. Other: Soft tissue swelling overlying the posterior left calvarium. IMPRESSION: No acute intracranial process. Chronic microvascular ischemic changes. Electronically Signed   By: Lovey Newcomer M.D.   On: 05/25/2017 15:44   US Venous Img Lower Unilateral Left  Result Date: 05/25/2017 CLINICAL DATA:  Left leg swelling for 10 days EXAM: LEFT LOWER EXTREMITY VENOUS DOPPLER ULTRASOUND TECHNIQUE: Gray-scale sonography with graded compression, as well as color Doppler and duplex ultrasound were performed to evaluate the lower extremity deep venous systems from the level of the common femoral vein and including the common femoral, femoral, profunda femoral, popliteal and calf veins including the  posterior tibial, peroneal and gastrocnemius veins when visible. The superficial great saphenous vein was also interrogated. Spectral Doppler was utilized to evaluate flow at rest and with distal augmentation maneuvers in the common femoral, femoral and popliteal veins. COMPARISON:  None. FINDINGS: Contralateral Common Femoral Vein: Respiratory phasicity is normal and symmetric with the symptomatic side. No evidence of thrombus. Normal compressibility. Common Femoral Vein: No evidence of thrombus. Saphenofemoral Junction: No evidence of thrombus. Profunda Femoral Vein: No evidence of thrombus. Femoral Vein: No evidence of thrombus. Popliteal Vein: No evidence of thrombus. Calf Veins: No evidence of thrombus. IMPRESSION: No evidence of DVT within the left lower extremity. Electronically Signed   By: Monte Fantasia M.D.   On: 05/25/2017 16:58    EKG:   Orders placed or performed during the hospital encounter of 05/25/17  . ED EKG  . ED EKG  . EKG 12-Lead  . EKG 12-Lead    IMPRESSION AND PLAN:   Betty [name]  is a 71 y.o. female with With multiple medical problems including ANCA associated vasculitis, interstitial lung disease following with rheumatology, chronic COPD currently not on home oxygen, untreated sleep apnea, atrial fibrillation status post ablations in the past, history of mechanical mitral valve replacement surgery on Coumadin, recent admission for anemia requiring transfusion, hypertension, diabetes mellitus, diastolic CHF presents to the hospital after a syncopal episode.  #1 syncope-secondary to hypotension. Appears hypovolemic. -Carotid Dopplers recently showing moderate atherosclerotic disease. Will not repeat. -Echo with known diastolic dysfunction. -Check orthostatic blood pressure changes. Hold her blood pressure medications. -Gentle hydration and physical therapy prior to discharge.  #2 acute renal failure-hold her torsemide and metolazone. Until hydration and monitor. Likely  ATN from hypotension and hypovolemia.  #3 diastolic CHF-euvolemic at this time, has chronic lower extremity edema and also crackles at the lung bases. The crepitations could be from her interstitial lung disease as well. -Hypotensive and hypovolemic at this time. So gentle hydration and closely monitor. Torsemide and metolazone on hold at this time  #4 mechanical mitral valve replacement surgery-on Coumadin, INR is at 2. Continue Coumadin. Recent admission with anemia and GI bleed. Hemoglobin is stable at this time. Follows with [facility] cardiology.  #5 chronic COPD and sleep apnea-untreated sleep apnea not on CPAP. Used to be on 2 L home oxygen, currently off of it. Continue neb treatments when necessary at this time.  #6 chronic atrial fibrillation-status post ablations in the past. Bradycardic at this time. Hold Cardizem and metoprolol. Monitor on telemetry. -On Coumadin for anticoagulation. Also on propafenone on for rhythm. Couldn't tolerate  flecainide in the past.  #7 DVT prophylaxis-on Coumadin. Pharmacy to dose it   Physical therapy consulted    All the records are reviewed and case discussed with ED provider. Management plans discussed with the patient, family and they are in agreement.  CODE STATUS: Full Code  TOTAL TIME TAKING CARE OF THIS PATIENT: 50 minutes.    Gladstone Lighter M.D on 05/25/2017 at 6:01 PM  Between 7am to 6pm - Pager - [phone]  After 6pm go to [email or web address] - password [email or web address] [facility]  Office  [phone]  CC: Primary care physician; Dion Body, MD

## 2017-05-25 NOTE — Progress Notes (Signed)
ANTICOAGULATION CONSULT NOTE - Initial Consult  Pharmacy Consult for warfarin Indication: mechanical heart valve  Allergies  Allergen Reactions  . Amiodarone Other (See Comments)    Pt states that this medication causes lung bleeding.      Patient Measurements: Height: 5\' 9"  (175.3 cm) Weight: 240 lb (108.9 kg) IBW/kg (Calculated) : 66.2 Heparin Dosing Weight:   Vital Signs: Temp: 98 F (36.7 C) (06/03 1512) Temp Source: Oral (06/03 1512) BP: 130/57 (06/03 1900) Pulse Rate: 50 (06/03 1900)  Labs:  Recent Labs  05/25/17 1609  HGB 8.6*  HCT 27.8*  PLT 330  APTT 32  LABPROT 22.8*  INR 1.98  CREATININE 1.52*  TROPONINI 0.03*    Estimated Creatinine Clearance: 45.3 mL/min (A) (by C-G formula based on SCr of 1.52 mg/dL (H)).   Medical History: Past Medical History:  Diagnosis Date  . Acute diastolic heart failure ([place])   . Allergy   . ANCA-associated vasculitis ([place])   . Asthma   . Atrial fibrillation ([place])   . Backache, unspecified   . Cardiomegaly   . COPD (chronic obstructive pulmonary disease) ([place])   . Diabetes mellitus without complication ([place])   . 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 Esophageal reflux   . Headache(784.0)   . Herpes zoster without mention of complication   . Hx: UTI (urinary tract infection)   . Hypertension    heart controlled w CHF  . Nontoxic uninodular goiter   . Obesity, unspecified   . Osteoarthrosis, unspecified whether generalized or localized, unspecified site   . Unspecified sleep apnea   . Urine incontinence    hx of    Medications:   (Not in a hospital admission)  Assessment: Pharmacy consulted to dose warfarin in this 71 year old female with Mechanical heart valve.  6/3 INR = 1.93 Pt was on warfarin 7.5 mg PO every Thursday and 5 mg PO all other days.   Last dose was on 05/24/17  Goal of Therapy:  INR :  2.5 - 3.5   Plan:  Last dose was on 05/24/17.  Will order warfarin 7.5 mg  PO X 1 for 6/3 and recheck INR on 6/4 with AM labs.   Betty [name] D 05/25/2017,7:34 PM

## 2017-05-25 NOTE — Care Management Obs Status (Signed)
[facility] NOTIFICATION   Patient Details  Name: Betty [name] MRN: [id number] Date of Birth: May 27, 1946   Medicare Observation Status Notification Given:  Yes (syncope)    Ival Bible, RN 05/25/2017, 6:40 PM

## 2017-05-26 LAB — BASIC METABOLIC PANEL
Anion gap: 12 (ref 5–15)
BUN: 64 mg/dL — ABNORMAL HIGH (ref 6–20)
CALCIUM: 8.2 mg/dL — AB (ref 8.9–10.3)
CHLORIDE: 90 mmol/L — AB (ref 101–111)
CO2: 27 mmol/L (ref 22–32)
CREATININE: 2.15 mg/dL — AB (ref 0.44–1.00)
GFR calc Af Amer: 26 mL/min — ABNORMAL LOW (ref >60)
GFR calc non Af Amer: 22 mL/min — ABNORMAL LOW (ref >60)
GLUCOSE: 172 mg/dL — AB (ref 65–99)
Potassium: 3.8 mmol/L (ref 3.5–5.1)
Sodium: 129 mmol/L — ABNORMAL LOW (ref 135–145)

## 2017-05-26 LAB — CBC
HCT: 29.4 % — ABNORMAL LOW (ref 35.0–47.0)
HEMOGLOBIN: 9.1 g/dL — AB (ref 12.0–16.0)
MCH: 22.6 pg — AB (ref 26.0–34.0)
MCHC: 30.9 g/dL — AB (ref 32.0–36.0)
MCV: 73.3 fL — AB (ref 80.0–100.0)
PLATELETS: 313 10*3/uL (ref 150–440)
RBC: 4.01 MIL/uL (ref 3.80–5.20)
RDW: 21.7 % — ABNORMAL HIGH (ref 11.5–14.5)
WBC: 12.3 10*3/uL — ABNORMAL HIGH (ref 3.6–11.0)

## 2017-05-26 LAB — GLUCOSE, CAPILLARY
GLUCOSE-CAPILLARY: 149 mg/dL — AB (ref 65–99)
Glucose-Capillary: 213 mg/dL — ABNORMAL HIGH (ref 65–99)

## 2017-05-26 LAB — TROPONIN I: Troponin I: <0.03 ng/mL (ref <0.03)

## 2017-05-26 LAB — PROTIME-INR
INR: 2.06
Prothrombin Time: 23.5 s — ABNORMAL HIGH (ref 11.4–15.2)

## 2017-05-26 MED ORDER — NYSTATIN 100000 UNIT/ML MT SUSP
5.0000 mL | Freq: Four times a day (QID) | OROMUCOSAL | Status: DC
  Administered 2017-05-26 – 2017-05-27 (×2): 500000 [IU] via OROMUCOSAL
  Filled 2017-05-26 (×3): qty 5

## 2017-05-26 MED ORDER — WARFARIN SODIUM 5 MG PO TABS
7.5000 mg | ORAL_TABLET | Freq: Every day | ORAL | Status: DC
  Administered 2017-05-26: 7.5 mg via ORAL
  Filled 2017-05-26: qty 2

## 2017-05-26 MED ORDER — INSULIN ASPART 100 UNIT/ML ~~LOC~~ SOLN
0.0000 [IU] | Freq: Every day | SUBCUTANEOUS | Status: DC
  Administered 2017-05-26: 2 [IU] via SUBCUTANEOUS
  Filled 2017-05-26: qty 2

## 2017-05-26 MED ORDER — INSULIN ASPART 100 UNIT/ML ~~LOC~~ SOLN
0.0000 [IU] | Freq: Three times a day (TID) | SUBCUTANEOUS | Status: DC
  Administered 2017-05-26: 1 [IU] via SUBCUTANEOUS
  Administered 2017-05-27: 2 [IU] via SUBCUTANEOUS
  Administered 2017-05-27: 5 [IU] via SUBCUTANEOUS
  Filled 2017-05-26: qty 5
  Filled 2017-05-26: qty 1
  Filled 2017-05-26: qty 2

## 2017-05-26 MED ORDER — SODIUM CHLORIDE 0.9 % IV SOLN
INTRAVENOUS | Status: DC
  Administered 2017-05-27: 04:00:00 via INTRAVENOUS

## 2017-05-26 NOTE — Progress Notes (Signed)
[name] NAME: Betty [name]    MR#:  [id number]  DATE OF BIRTH:  1946-03-29  SUBJECTIVE:   Pt. Here due to suspected syncopal episode. Also noted to be in acute kidney injury. Still complaining of profound weakness. Creatinine has not improved with IV fluids. Orthostatic vital signs are negative today.  REVIEW OF SYSTEMS:    Review of Systems  Constitutional: Negative for chills and fever.  HENT: Negative for congestion and tinnitus.   Eyes: Negative for blurred vision and double vision.  Respiratory: Negative for cough, shortness of breath and wheezing.   Cardiovascular: Negative for chest pain, orthopnea and PND.  Gastrointestinal: Negative for abdominal pain, diarrhea, nausea and vomiting.  Genitourinary: Negative for dysuria and hematuria.  Neurological: Positive for weakness. Negative for dizziness, sensory change and focal weakness.  All other systems reviewed and are negative.   Nutrition: Heart Healthy Tolerating Diet: Yes Tolerating PT: Eval noted.   DRUG ALLERGIES:   Allergies  Allergen Reactions  . Amiodarone Other (See Comments)    Pt states that this medication causes lung bleeding.      VITALS:  Blood pressure (!) 121/103, pulse 65, temperature 98.3 F (36.8 C), temperature source Oral, resp. rate 20, height 5\' 9"  (1.753 m), weight 109.5 kg (241 lb 8 oz), SpO2 100 %.  PHYSICAL EXAMINATION:   Physical Exam  GENERAL:  71 y.o.-year-old patient lying in bed in no acute distress.  EYES: Pupils equal, round, reactive to light and accommodation. No scleral icterus. Extraocular muscles intact.  HEENT: Head atraumatic, normocephalic. Oropharynx and nasopharynx clear.  NECK:  Supple, no jugular venous distention. No thyroid enlargement, no tenderness.  LUNGS: Normal breath sounds bilaterally, no wheezing, rales, rhonchi. No use of accessory muscles of respiration.  CARDIOVASCULAR: S1, S2 normal. + metallic valve  click. No murmurs, rubs, or gallops.  ABDOMEN: Soft, nontender, nondistended. Bowel sounds present. No organomegaly or mass.  EXTREMITIES: No cyanosis, clubbing or edema b/l.    NEUROLOGIC: Cranial nerves II through XII are intact. No focal Motor or sensory deficits b/l.   PSYCHIATRIC: The patient is alert and oriented x 3.  SKIN: No obvious rash, lesion, or ulcer.    LABORATORY PANEL:   CBC  Recent Labs Lab 05/26/17 0229  WBC 12.3*  HGB 9.1*  HCT 29.4*  PLT 313   ------------------------------------------------------------------------------------------------------------------  Chemistries   Recent Labs Lab 05/26/17 0229  NA 129*  K 3.8  CL 90*  CO2 27  GLUCOSE 172*  BUN 64*  CREATININE 2.15*  CALCIUM 8.2*   ------------------------------------------------------------------------------------------------------------------  Cardiac Enzymes  Recent Labs Lab 05/26/17 0735  TROPONINI <0.03   ------------------------------------------------------------------------------------------------------------------  RADIOLOGY:  Dg Chest 2 View  Result Date: 05/25/2017 CLINICAL DATA:  Syncope. EXAM: CHEST  2 VIEW COMPARISON:  04/04/2017 and prior exams FINDINGS: Cardiomegaly, cardiac valve replacement and chronic interstitial opacities again noted. There is no evidence of focal airspace disease, pulmonary edema, suspicious pulmonary nodule/mass, pleural effusion, or pneumothorax. No acute bony abnormalities are identified. IMPRESSION: Cardiomegaly without evidence of acute cardiopulmonary disease. Chronic interstitial lung disease. Electronically Signed   By: Betty [name] M.D.   On: 05/25/2017 15:54   Ct Head Wo Contrast  Result Date: 05/25/2017 CLINICAL DATA:  Patient status post fall.  Patient on Coumadin. EXAM: CT HEAD WITHOUT CONTRAST TECHNIQUE: Contiguous axial images were obtained from the base of the skull through the vertex without intravenous contrast. COMPARISON:  Brain CT  04/06/2017 FINDINGS: Brain: Ventricles and  sulci are appropriate for patient's age. Remote right caudate infarct. No evidence for acute cortically based infarct, intracranial hemorrhage, mass lesion or mass-effect. Vascular: Internal carotid arterial vascular calcifications. Skull: Intact. Sinuses/Orbits: Paranasal sinuses are well aerated. Mild mucosal thickening. Mastoid air cells are unremarkable. The orbits are unremarkable. Other: Soft tissue swelling overlying the posterior left calvarium. IMPRESSION: No acute intracranial process. Chronic microvascular ischemic changes. Electronically Signed   By: Betty [name] M.D.   On: 05/25/2017 15:44   US Venous Img Lower Unilateral Left  Result Date: 05/25/2017 CLINICAL DATA:  Left leg swelling for 10 days EXAM: LEFT LOWER EXTREMITY VENOUS DOPPLER ULTRASOUND TECHNIQUE: Gray-scale sonography with graded compression, as well as color Doppler and duplex ultrasound were performed to evaluate the lower extremity deep venous systems from the level of the common femoral vein and including the common femoral, femoral, profunda femoral, popliteal and calf veins including the posterior tibial, peroneal and gastrocnemius veins when visible. The superficial great saphenous vein was also interrogated. Spectral Doppler was utilized to evaluate flow at rest and with distal augmentation maneuvers in the common femoral, femoral and popliteal veins. COMPARISON:  None. FINDINGS: Contralateral Common Femoral Vein: Respiratory phasicity is normal and symmetric with the symptomatic side. No evidence of thrombus. Normal compressibility. Common Femoral Vein: No evidence of thrombus. Saphenofemoral Junction: No evidence of thrombus. Profunda Femoral Vein: No evidence of thrombus. Femoral Vein: No evidence of thrombus. Popliteal Vein: No evidence of thrombus. Calf Veins: No evidence of thrombus. IMPRESSION: No evidence of DVT within the left lower extremity. Electronically Signed   By: Betty [name] M.D.   On: 05/25/2017 16:58     ASSESSMENT AND PLAN:   Betty [name]  is a 71 y.o. female with With multiple medical problems including ANCA associated vasculitis, interstitial lung disease following with rheumatology, chronic COPD currently not on home oxygen, untreated sleep apnea, atrial fibrillation status post ablations in the past, history of mechanical mitral valve replacement surgery on Coumadin, recent admission for anemia requiring transfusion, hypertension, diabetes mellitus, diastolic CHF presents to the hospital after a syncopal episode.  #1 syncope-secondary to hypotension. Orthostatic vital signs are negative this morning. -Carotid Dopplers recently showing moderate atherosclerotic disease.  -Echo with known diastolic dysfunction. -No further episodes of syncope. No arrhythmias on telemetry. Continue to hold antihypertensives for now. -Physical therapy evaluation noted.  #2 acute renal failure-creatinine a bit worse since yesterday. Suspect this is ATN/dehydration. Continue to hold her torsemide and metolazone.  -Follow BUN and creatinine. Renal dose meds, avoid nephrotoxins.  #3 diastolic CHF-clinically not in congestive heart failure. Hold diuretics given the acute kidney injury and dehydration. -Continue gentle IV fluids.  #4 mechanical mitral valve replacement surgery-on Coumadin, INR is at 2. Continue Coumadin. Recent admission with anemia and GI bleed. Hemoglobin is stable at this time. Follows with [place] cardiology.  #5 chronic COPD and sleep apnea-untreated sleep apnea not on CPAP. Used to be on 2 L home oxygen, currently off of it.  -Continue when necessary DuoNeb's.  #6 chronic atrial fibrillation-status post ablations in the past. Bradycardic at this time. Hold Cardizem and metoprolol. Monitor on telemetry. -On Coumadin for anticoagulation. Also on propafenone on for rhythm. Couldn't tolerate flecainide in the past     All the records are reviewed and  case discussed with Care Management/Social Worker. Management plans discussed with the patient, family and they are in agreement.  CODE STATUS: Full  DVT Prophylaxis: Coumadin  TOTAL TIME TAKING CARE OF THIS PATIENT: 30 minutes.  POSSIBLE D/C IN 1-2 DAYS, DEPENDING ON CLINICAL CONDITION.   Henreitta Leber M.D on 05/26/2017 at 3:08 PM  Between 7am to 6pm - Pager - [phone]  After 6pm go to [email or web address] - Proofreader  [facility]  Office  [phone]  CC: Primary care physician; Dion Body, MD

## 2017-05-26 NOTE — Care Management (Addendum)
[facility] is currently being [facility] nursing. Agency aware patient is currently under observation

## 2017-05-26 NOTE — Progress Notes (Signed)
Inpatient Diabetes Program Recommendations  AACE/ADA: New Consensus Statement on Inpatient Glycemic Control (2015)  Target Ranges:  Prepandial:   less than 140 mg/dL      Peak postprandial:   less than 180 mg/dL (1-2 hours)      Critically ill patients:  140 - 180 mg/dL   Results for CHELSA, STOUT (MRN [id number]) as of 05/26/2017 13:47  Ref. Range 05/25/2017 16:13 05/25/2017 20:01  Glucose-Capillary Latest Ref Range: 65 - 99 mg/dL 183 (H) 194 (H)   Admit with: Syncope/ Acute Renal Failure/ Hyponatremia  History: DM  Home DM Meds: Metformin 1000 mg BID  Current Insulin Orders: Metformin 1000 mg BID     MD- Please consider the following in-hospital insulin adjustments:  1. Stop Metformin for now- Creatinine 2.15 mg/dl on morning BMET  2. Start Novolog Sensitive Correction Scale/ SSI (0-9 units) TID AC + HS    --Will follow patient during hospitalization--  Wyn Quaker RN, MSN, CDE Diabetes Coordinator Inpatient Glycemic Control Team Team Pager: [phone] (8a-5p)

## 2017-05-26 NOTE — Evaluation (Signed)
Physical Therapy Evaluation Patient Details Name: Betty [name] MRN: [id number] DOB: [birth year] Today's Date: 05/26/2017   History of Present Illness  presented to ER and admitted under observation status post dizziness and syncopal episode.  CT head and chest x-ray negative for acute pathology.  L LE doppler negative DVT  Clinical Impression  Upon evaluation, patient notably fatigued/lethargic (frequently closing eyes, dosing to sleep throughout session), but easily arousable and oriented to all information.  Demonstrates strength and ROM grossly symmetrical and WFL throughout all extremities; no focal weakness or sensory deficit appreciated. Able to complete bed mobility with mod indep; sit/stand, basic transfers and gait (50') without assist device, cga.  Increased sway in all planes, requiring external assist a times; unable to maintain SLS >2-3 seconds on either LE, indicative of increased fall risk (patient reaching for external stabilization for safety during tests).  Will consider trial of RW in subsequent sessions; however, patient currently declining.  Will continue to monitor performance as alertness improves; RN informed/aware. Would benefit from skilled PT to address above deficits and promote optimal return to PLOF; Recommend transition to [place] upon discharge from acute hospitalization.     Follow Up Recommendations Home health PT    Equipment Recommendations       Recommendations for Other Services       Precautions / Restrictions Precautions Precautions: Fall Restrictions Weight Bearing Restrictions: No      Mobility  Bed Mobility Overal bed mobility: Modified Independent                Transfers Overall transfer level: Needs assistance Equipment used: None Transfers: Sit to/from Stand Sit to Stand: Min guard;Min assist         General transfer comment: requires use of UEs to assist with lift off and external  stabilization  Ambulation/Gait Ambulation/Gait assistance: Min guard Ambulation Distance (Feet): 50 Feet Assistive device: None       General Gait Details: slightly broad BOS with increased sway in all directions; intermittently reaching for walls/furniture for external stabilization.  Broad turning radius.  Refuses trial of RW at this time.  Stairs            Wheelchair Mobility    Modified Rankin (Stroke Patients Only)       Balance Overall balance assessment: Needs assistance Sitting-balance support: No upper extremity supported;Feet supported Sitting balance-Leahy Scale: Good     Standing balance support: No upper extremity supported Standing balance-Leahy Scale: Fair   Single Leg Stance - Right Leg: 2 Single Leg Stance - Left Leg: 2           High Level Balance Comments: marked decrease in stability during periods of narrowed BOS or SLS, requiring external stabilization for optimal safety             Pertinent Vitals/Pain      Home Living Family/patient expects to be discharged to:: Private residence Living Arrangements: Alone Available Help at Discharge: Family;Available PRN/intermittently Type of Home: House Home Access: Stairs to enter Entrance Stairs-Rails: Right Entrance Stairs-Number of Steps: 2 Home Layout: One level        Prior Function Level of Independence: Independent         Comments: Mod indep without assist device for ADLs, household and community mobility; + driving; denies fall history.     Hand Dominance        Extremity/Trunk Assessment   Upper Extremity Assessment Upper Extremity Assessment: Overall WFL for tasks assessed    Lower Extremity Assessment  Lower Extremity Assessment: Overall WFL for tasks assessed (grossly at least 4+/5 throughout; mild tenderness over anterior portion of L ankle (not acute per patient))       Communication   Communication: No difficulties  Cognition Arousal/Alertness:  Awake/alert Behavior During Therapy: WFL for tasks assessed/performed Overall Cognitive Status: Within Functional Limits for tasks assessed                                        General Comments      Exercises Other Exercises Other Exercises: Toilet transfer, ambulatory without assist device, cga; sit/stand from standard toilet with grab bar, cga; standing balance for hand hygiene, close sup; noted impairment in functional reach (3-4" from immediate BOS), requiring contralateral UE support to maximizxe reach and safety   Assessment/Plan    PT Assessment Patient needs continued PT services  PT Problem List Decreased activity tolerance;Decreased balance;Decreased mobility;Decreased coordination;Decreased knowledge of use of DME;Decreased safety awareness;Decreased knowledge of precautions;Cardiopulmonary status limiting activity       PT Treatment Interventions DME instruction;Gait training;Stair training;Functional mobility training;Therapeutic activities;Therapeutic exercise;Balance training;Patient/family education    PT Goals (Current goals can be found in the Care Plan section)  Acute Rehab PT Goals Patient Stated Goal: to go home PT Goal Formulation: With patient Time For Goal Achievement: 06/09/17 Potential to Achieve Goals: Good    Frequency Min 2X/week   Barriers to discharge Decreased caregiver support      Co-evaluation               AM-PAC PT "6 Clicks" Daily Activity  Outcome Measure Difficulty turning over in bed (including adjusting bedclothes, sheets and blankets)?: None Difficulty moving from lying on back to sitting on the side of the bed? : None Difficulty sitting down on and standing up from a chair with arms (e.g., wheelchair, bedside commode, etc,.)?: Total Help needed moving to and from a bed to chair (including a wheelchair)?: A Little Help needed walking in hospital room?: A Little Help needed climbing 3-5 steps with a railing?  : A Little 6 Click Score: 18    End of Session Equipment Utilized During Treatment: Gait belt Activity Tolerance: Patient tolerated treatment well Patient left: in bed;with bed alarm set;with call bell/phone within reach Nurse Communication: Mobility status (lethargy throughout session) PT Visit Diagnosis: Muscle weakness (generalized) (M62.81);Difficulty in walking, not elsewhere classified (R26.2);History of falling (Z91.81)    Time: 9147-8295 PT Time Calculation (min) (ACUTE ONLY): 29 min   Charges:   PT Evaluation $PT Eval Low Complexity: 1 Procedure PT Treatments $Therapeutic Activity: 8-22 mins   PT G Codes:   PT G-Codes **NOT FOR INPATIENT CLASS** Functional Assessment Tool Used: AM-PAC 6 Clicks Basic Mobility Functional Limitation: Mobility: Walking and moving around Mobility: Walking and Moving Around Current Status (A2130): At least 40 percent but less than 60 percent impaired, limited or restricted Mobility: Walking and Moving Around Goal Status [phone]): At least 1 percent but less than 20 percent impaired, limited or restricted    Dionis Autry H. Owens Shark, PT, DPT, NCS 05/26/17, 1:38 PM [phone]

## 2017-05-26 NOTE — Progress Notes (Signed)
ANTICOAGULATION CONSULT NOTE - Initial Consult  Pharmacy Consult for warfarin Indication: mechanical heart valve  Allergies  Allergen Reactions  . Amiodarone Other (See Comments)    Pt states that this medication causes lung bleeding.      Patient Measurements: Height: 5\' 9"  (175.3 cm) Weight: 241 lb 8 oz (109.5 kg) IBW/kg (Calculated) : 66.2 Heparin Dosing Weight:   Vital Signs: Temp: 98.3 F (36.8 C) (06/04 0826) Temp Source: Oral (06/04 0826) BP: 121/103 (06/04 0826) Pulse Rate: 65 (06/04 0826)  Labs:  Recent Labs  05/25/17 1609 05/25/17 2018 05/26/17 0229 05/26/17 0735  HGB 8.6*  --  9.1*  --   HCT 27.8*  --  29.4*  --   PLT 330  --  313  --   APTT 32  --   --   --   LABPROT 22.8*  --  23.5*  --   INR 1.98  --  2.06  --   CREATININE 1.52*  --  2.15*  --   TROPONINI 0.03* <0.03 <0.03 <0.03    Estimated Creatinine Clearance: 32.1 mL/min (A) (by C-G formula based on SCr of 2.15 mg/dL (H)).   Medical History: Past Medical History:  Diagnosis Date  . Acute diastolic heart failure ([place])   . Allergy   . ANCA-associated vasculitis ([place])   . Asthma   . Atrial fibrillation ([place])   . Backache, unspecified   . Cardiomegaly   . COPD (chronic obstructive pulmonary disease) ([place])   . Diabetes mellitus without complication ([place])   . 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 Esophageal reflux   . Headache(784.0)   . Herpes zoster without mention of complication   . Hx: UTI (urinary tract infection)   . Hypertension    heart controlled w CHF  . Nontoxic uninodular goiter   . Obesity, unspecified   . Osteoarthrosis, unspecified whether generalized or localized, unspecified site   . Unspecified sleep apnea   . Urine incontinence    hx of    Medications:  Prescriptions Prior to Admission  Medication Sig Dispense Refill Last Dose  . albuterol (PROVENTIL HFA;VENTOLIN HFA) 108 (90 Base) MCG/ACT inhaler Inhale 2 puffs into the lungs  every 4 (four) hours as needed for wheezing or shortness of breath. 1 Inhaler 10 prn at prn  . albuterol (PROVENTIL) (2.5 MG/3ML) 0.083% nebulizer solution Take 3 mLs (2.5 mg total) by nebulization every 4 (four) hours as needed for wheezing or shortness of breath. 75 mL 12 prn at prn  . aspirin EC 81 MG tablet Take 81 mg by mouth daily.   05/25/2017 at am  . atorvastatin (LIPITOR) 20 MG tablet Take 20 mg by mouth daily.   05/25/2017 at am  . calcium-vitamin D (OSCAL WITH D) 500-200 MG-UNIT per tablet Take 1 tablet by mouth daily.   05/25/2017 at am  . diltiazem (CARDIZEM LA) 180 MG 24 hr tablet Take 180 mg by mouth daily.   4 05/25/2017 at am  . ferrous sulfate 325 (65 FE) MG tablet Take 325 mg by mouth daily with breakfast.   05/25/2017 at am  . metoprolol tartrate (LOPRESSOR) 25 MG tablet Take 0.5 tablets (12.5 mg total) by mouth 2 (two) times daily. 30 tablet 0 05/25/2017 at AM  . potassium chloride (K-DUR) 10 MEQ tablet Take 2 tablets (20 mEq total) by mouth daily. While on lasix 30 tablet 2 05/25/2017 at am  . rOPINIRole (REQUIP) 2 MG tablet Take 1 tablet (  2 mg total) by mouth at bedtime as needed (restless leg syndrome). (Patient taking differently: Take 4 mg by mouth at bedtime. ) 30 tablet 0 05/24/2017 at qhs  . spironolactone (ALDACTONE) 50 MG tablet Take 50 mg by mouth daily.   05/25/2017 at am  . torsemide (DEMADEX) 20 MG tablet Take 80 mg by mouth 2 (two) times daily.    05/25/2017 at am  . traZODone (DESYREL) 100 MG tablet Take 200 mg by mouth at bedtime.   05/24/2017 at qhs   . Venlafaxine HCl 225 MG TB24 Take 225 mg by mouth daily.   05/25/2017 at am  . warfarin (COUMADIN) 5 MG tablet Take 5 mg by mouth daily. 5mg  everyday except THURSDAY   05/24/2017 at pm  . warfarin (COUMADIN) 7.5 MG tablet Take 7.5 mg by mouth daily. ONLY on thursday   05/24/2017 at pm  . azaTHIOprine (IMURAN) 50 MG tablet Take 100 mg by mouth daily.    03/27/2017 at 0900  . gabapentin (NEURONTIN) 600 MG tablet Take 600 mg by mouth 2 (two)  times daily.   03/27/2017 at 0900  . HYDROcodone-acetaminophen (NORCO/VICODIN) 5-325 MG tablet Take 1 tablet by mouth every 8 (eight) hours as needed for moderate pain. 30 tablet 0   . metaxalone (SKELAXIN) 400 MG tablet Take 1 tablet (400 mg total) by mouth 3 (three) times daily as needed for muscle spasms. 30 tablet 0   . metFORMIN (GLUCOPHAGE) 1000 MG tablet Take 1,000 mg by mouth 2 (two) times daily with a meal.    03/27/2017 at 0900  . metolazone (ZAROXOLYN) 2.5 MG tablet Take 2.5 mg by mouth 2 (two) times a week. prn   prn at prn  . oxybutynin (DITROPAN) 5 MG tablet Take 5 mg by mouth 2 (two) times daily.   Not Taking at Unknown time  . pantoprazole (PROTONIX) 40 MG tablet Take 40 mg by mouth daily.   03/27/2017 at 0900  . propafenone (RYTHMOL) 225 MG tablet Take 225 mg by mouth every 12 (twelve) hours.   03/27/2017 at 0900    Assessment: Pharmacy consulted to dose warfarin in this 71 year old female with mechanical mitral valve.   Date  INR  Warfarin Dose (mg) 6/3  1.98  7.5  6/4  2.06  Goal of Therapy:  INR :  2.5 - 3.5   Plan:  Will continue with warfarin 7.5 mg daily and f/u AM INR.   Ulice Dash D 05/26/2017,9:04 AM

## 2017-05-27 LAB — BASIC METABOLIC PANEL
Anion gap: 7 (ref 5–15)
BUN: 43 mg/dL — AB (ref 6–20)
CO2: 30 mmol/L (ref 22–32)
CREATININE: 0.97 mg/dL (ref 0.44–1.00)
Calcium: 8.6 mg/dL — ABNORMAL LOW (ref 8.9–10.3)
Chloride: 98 mmol/L — ABNORMAL LOW (ref 101–111)
GFR calc Af Amer: >60 mL/min (ref >60)
GFR calc non Af Amer: 58 mL/min — ABNORMAL LOW (ref >60)
Glucose, Bld: 146 mg/dL — ABNORMAL HIGH (ref 65–99)
POTASSIUM: 3.8 mmol/L (ref 3.5–5.1)
Sodium: 135 mmol/L (ref 135–145)

## 2017-05-27 LAB — GLUCOSE, CAPILLARY
Glucose-Capillary: 156 mg/dL — ABNORMAL HIGH (ref 65–99)
Glucose-Capillary: 286 mg/dL — ABNORMAL HIGH (ref 65–99)

## 2017-05-27 LAB — PROTIME-INR
INR: 2.69
PROTHROMBIN TIME: 29.1 s — AB (ref 11.4–15.2)

## 2017-05-27 MED ORDER — WARFARIN SODIUM 5 MG PO TABS: 7.5000 mg | ORAL_TABLET | ORAL | Status: DC

## 2017-05-27 MED ORDER — WARFARIN SODIUM 5 MG PO TABS: 5.0000 mg | ORAL_TABLET | ORAL | Status: DC

## 2017-05-27 NOTE — Discharge Summary (Signed)
Betty [name] at [place] NAME: Betty [name]    MR#:  [id number]  DATE OF BIRTH:  1946/10/09  DATE OF ADMISSION:  05/25/2017 ADMITTING PHYSICIAN: Gladstone Lighter, MD  DATE OF DISCHARGE: 05/27/2017  PRIMARY CARE PHYSICIAN: Dion Body, MD    ADMISSION DIAGNOSIS:  Other specified hypotension [I95.89] Hyponatremia [E87.1] Acute renal insufficiency [N28.9] Symptomatic bradycardia [R00.1] Injury of head, initial encounter [S09.90XA] Left leg swelling [M79.89] Syncope, unspecified syncope type [R55]  DISCHARGE DIAGNOSIS:  Active Problems:   Syncope   SECONDARY DIAGNOSIS:   Past Medical History:  Diagnosis Date  . Acute diastolic heart failure ([place])   . Allergy   . 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 Asthma   . Atrial fibrillation ([place])   . Backache, unspecified   . Cardiomegaly   . COPD (chronic obstructive pulmonary disease) ([place])   . Diabetes mellitus without complication ([place])   . 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 Esophageal reflux   . Headache(784.0)   . Herpes zoster without mention of complication   . Hx: UTI (urinary tract infection)   . Hypertension    heart controlled w CHF  . Nontoxic uninodular goiter   . Obesity, unspecified   . Osteoarthrosis, unspecified whether generalized or localized, unspecified site   . Unspecified sleep apnea   . Urine incontinence    hx of    HOSPITAL COURSE:   Betty [name] a 71 y.o. femalewith With multiple medical problems including ANCAassociated vasculitis, interstitial lung disease following with rheumatology, chronic COPD currently not on home oxygen, untreated sleep apnea, atrial fibrillation status post ablations in the past, history of mechanical mitral valve replacement surgery on Coumadin, recent admission for anemia requiring transfusion, hypertension, diabetes mellitus, diastolic CHF presents to the hospital after a syncopal  episode.  #1 syncope-secondary to hypotension. Patient was hydrated with IV fluids and hypotension/orthostasis has not resolved. -Carotid Dopplers recently showing moderate atherosclerotic disease with no hemodynamically significant carotid stenosis. -Echo with known diastolic dysfunction. -No further episodes of syncope. No arrhythmias on telemetry. By physical therapy and recommended home health services which is being arranged for the patient prior to discharge.  #2 acute renal failure-this was secondary to ATN/dehydration. Patient's diuretics and antihypertensive meds were held. She was given IV fluids and a creatinine has improved back to baseline. She will now resume her meds upon discharge.  #3 diastolic CHF-clinically patient was not in congestive heart failure on the hospital. Her diuretics were held. She was given gentle IV fluids. She will now resume her maintenance meds including torsemide, Aldactone and metoprolol upon discharge.   #4 mechanical mitral valve replacement surgery-on Coumadin, INR is at 2. Continue Coumadin. Recent admission with anemia and GI bleed. Hemoglobin is stable at this time. Follows with [facility].  #5 chronic COPD and sleep apnea-untreated sleep apnea not on CPAP. Used to be on 2 L home oxygen, currently off of it.  -Patient has significant daytime somnolence and was strongly advised to follow-up with her primary to get a repeat sleep study done and start treatment for CPAP as this is her primary complaint presently.  #6 chronic atrial fibrillation-status post ablations in the past. Pt. Was bradycardic and so her Cardizem and metoprolol were held but they are now being resumed upon discharged.  - she will cont. Her Coumadin, and Rythmol.   #7 Restless Leg syndrome - she will resume her Requip.   #8 Urinary Incontinence - she  will cont. Her Oxybutynin.   DISCHARGE CONDITIONS:   Stable.   CONSULTS OBTAINED:    DRUG ALLERGIES:   Allergies   Allergen Reactions  . Amiodarone Other (See Comments)    Pt states that this medication causes lung bleeding.      DISCHARGE MEDICATIONS:   Allergies as of 05/27/2017      Reactions   Amiodarone Other (See Comments)   Pt states that this medication causes lung bleeding.        Medication List    STOP taking these medications   HYDROcodone-acetaminophen 5-325 MG tablet Commonly known as:  NORCO/VICODIN   metaxalone 400 MG tablet Commonly known as:  SKELAXIN     TAKE these medications   albuterol 108 (90 Base) MCG/ACT inhaler Commonly known as:  PROVENTIL HFA;VENTOLIN HFA Inhale 2 puffs into the lungs every 4 (four) hours as needed for wheezing or shortness of breath.   albuterol (2.5 MG/3ML) 0.083% nebulizer solution Commonly known as:  PROVENTIL Take 3 mLs (2.5 mg total) by nebulization every 4 (four) hours as needed for wheezing or shortness of breath.   aspirin EC 81 MG tablet Take 81 mg by mouth daily.   atorvastatin 20 MG tablet Commonly known as:  LIPITOR Take 20 mg by mouth daily.   azaTHIOprine 50 MG tablet Commonly known as:  IMURAN Take 100 mg by mouth daily.   calcium-vitamin D 500-200 MG-UNIT tablet Commonly known as:  OSCAL WITH D Take 1 tablet by mouth daily.   diltiazem 180 MG 24 hr tablet Commonly known as:  CARDIZEM LA Take 180 mg by mouth daily.   ferrous sulfate 325 (65 FE) MG tablet Take 325 mg by mouth daily with breakfast.   gabapentin 600 MG tablet Commonly known as:  NEURONTIN Take 600 mg by mouth 2 (two) times daily.   metFORMIN 1000 MG tablet Commonly known as:  GLUCOPHAGE Take 1,000 mg by mouth 2 (two) times daily with a meal.   metolazone 2.5 MG tablet Commonly known as:  ZAROXOLYN Take 2.5 mg by mouth 2 (two) times a week. Take 2.5 mg by mouth on Monday and Thursday.   metoprolol tartrate 25 MG tablet Commonly known as:  LOPRESSOR Take 0.5 tablets (12.5 mg total) by mouth 2 (two) times daily. What changed:  how much to  take  when to take this   oxybutynin 5 MG tablet Commonly known as:  DITROPAN Take 5 mg by mouth 2 (two) times daily.   pantoprazole 40 MG tablet Commonly known as:  PROTONIX Take 40 mg by mouth daily.   potassium chloride 10 MEQ tablet Commonly known as:  K-DUR Take 2 tablets (20 mEq total) by mouth daily. While on lasix   propafenone 225 MG tablet Commonly known as:  RYTHMOL Take 225 mg by mouth every 12 (twelve) hours.   rOPINIRole 2 MG tablet Commonly known as:  REQUIP Take 1 tablet (2 mg total) by mouth at bedtime as needed (restless leg syndrome). What changed:  how much to take  when to take this   spironolactone 50 MG tablet Commonly known as:  ALDACTONE Take 50 mg by mouth daily.   torsemide 20 MG tablet Commonly known as:  DEMADEX Take 80 mg by mouth 2 (two) times daily.   traZODone 100 MG tablet Commonly known as:  DESYREL Take 200 mg by mouth at bedtime.   Venlafaxine HCl 225 MG Tb24 Take 225 mg by mouth daily.   warfarin 5 MG tablet Commonly known as:  COUMADIN Take 5 mg by mouth daily. 5 mg everyday except Tuesday and Thursday   warfarin 7.5 MG tablet Commonly known as:  COUMADIN Take 7.5 mg by mouth 2 (two) times a week. Take 7.5 mg by mouth on Tuesday and Thursday.         DISCHARGE INSTRUCTIONS:   DIET:  Cardiac diet  DISCHARGE CONDITION:  Stable  ACTIVITY:  Activity as tolerated  OXYGEN:  Home Oxygen: No.   Oxygen Delivery: room air  DISCHARGE LOCATION:  Home with Home Health PT, RN.   If you experience worsening of your admission symptoms, develop shortness of breath, life threatening emergency, suicidal or homicidal thoughts you must seek medical attention immediately by calling 911 or calling your MD immediately  if symptoms less severe.  You Must read complete instructions/literature along with all the possible adverse reactions/side effects for all the Medicines you take and that have been prescribed to you. Take any  new Medicines after you have completely understood and accpet all the possible adverse reactions/side effects.   Please note  You were cared for by a hospitalist during your hospital stay. If you have any questions about your discharge medications or the care you received while you were in the hospital after you are discharged, you can call the unit and asked to speak with the hospitalist on call if the hospitalist that took care of you is not available. Once you are discharged, your primary care physician will handle any further medical issues. Please note that NO REFILLS for any discharge medications will be authorized once you are discharged, as it is imperative that you return to your primary care physician (or establish a relationship with a primary care physician if you do not have one) for your aftercare needs so that they can reassess your need for medications and monitor your lab values.     Today   Still has daytime somnolence. Cr. Back to baseline.  No bradycardia.  No other complaints. Will d/c home today with resumption of Home Health services.   VITAL SIGNS:  Blood pressure 112/60, pulse 76, temperature 98.3 F (36.8 C), temperature source Oral, resp. rate 18, height 5\' 9"  (1.753 m), weight 109.5 kg (241 lb 8 oz), SpO2 95 %.  I/O:   Intake/Output Summary (Last 24 hours) at 05/27/17 1407 Last data filed at 05/27/17 1220  Gross per 24 hour  Intake          2548.75 ml  Output             1750 ml  Net           798.75 ml    PHYSICAL EXAMINATION:   GENERAL:  71 y.o.-year-old patient lying in bed in no acute distress.  EYES: Pupils equal, round, reactive to light and accommodation. No scleral icterus. Extraocular muscles intact.  HEENT: Head atraumatic, normocephalic. Oropharynx and nasopharynx clear.  NECK:  Supple, no jugular venous distention. No thyroid enlargement, no tenderness.  LUNGS: Normal breath sounds bilaterally, no wheezing, rales, rhonchi. No use of accessory  muscles of respiration.  CARDIOVASCULAR: S1, S2 normal. + metallic valve click. No murmurs, rubs, or gallops.  ABDOMEN: Soft, nontender, nondistended. Bowel sounds present. No organomegaly or mass.  EXTREMITIES: No cyanosis, clubbing or edema b/l.    NEUROLOGIC: Cranial nerves II through XII are intact. No focal Motor or sensory deficits b/l.   PSYCHIATRIC: The patient is alert and oriented x 3.  SKIN: No obvious rash, lesion, or ulcer.  DATA REVIEW:   CBC  Recent Labs Lab 05/26/17 0229  WBC 12.3*  HGB 9.1*  HCT 29.4*  PLT 313    Chemistries   Recent Labs Lab 05/27/17 0436  NA 135  K 3.8  CL 98*  CO2 30  GLUCOSE 146*  BUN 43*  CREATININE 0.97  CALCIUM 8.6*    Cardiac Enzymes  Recent Labs Lab 05/26/17 0735  TROPONINI <0.03    RADIOLOGY:  Dg Chest 2 View  Result Date: 05/25/2017 CLINICAL DATA:  Syncope. EXAM: CHEST  2 VIEW COMPARISON:  04/04/2017 and prior exams FINDINGS: Cardiomegaly, cardiac valve replacement and chronic interstitial opacities again noted. There is no evidence of focal airspace disease, pulmonary edema, suspicious pulmonary nodule/mass, pleural effusion, or pneumothorax. No acute bony abnormalities are identified. IMPRESSION: Cardiomegaly without evidence of acute cardiopulmonary disease. Chronic interstitial lung disease. Electronically Signed   By: Margarette Canada M.D.   On: 05/25/2017 15:54   Ct Head Wo Contrast  Result Date: 05/25/2017 CLINICAL DATA:  Patient status post fall.  Patient on Coumadin. EXAM: CT HEAD WITHOUT CONTRAST TECHNIQUE: Contiguous axial images were obtained from the base of the skull through the vertex without intravenous contrast. COMPARISON:  Brain CT 04/06/2017 FINDINGS: Brain: Ventricles and sulci are appropriate for patient's age. Remote right caudate infarct. No evidence for acute cortically based infarct, intracranial hemorrhage, mass lesion or mass-effect. Vascular: Internal carotid arterial vascular calcifications. Skull:  Intact. Sinuses/Orbits: Paranasal sinuses are well aerated. Mild mucosal thickening. Mastoid air cells are unremarkable. The orbits are unremarkable. Other: Soft tissue swelling overlying the posterior left calvarium. IMPRESSION: No acute intracranial process. Chronic microvascular ischemic changes. Electronically Signed   By: Lovey Newcomer M.D.   On: 05/25/2017 15:44   US Venous Img Lower Unilateral Left  Result Date: 05/25/2017 CLINICAL DATA:  Left leg swelling for 10 days EXAM: LEFT LOWER EXTREMITY VENOUS DOPPLER ULTRASOUND TECHNIQUE: Gray-scale sonography with graded compression, as well as color Doppler and duplex ultrasound were performed to evaluate the lower extremity deep venous systems from the level of the common femoral vein and including the common femoral, femoral, profunda femoral, popliteal and calf veins including the posterior tibial, peroneal and gastrocnemius veins when visible. The superficial great saphenous vein was also interrogated. Spectral Doppler was utilized to evaluate flow at rest and with distal augmentation maneuvers in the common femoral, femoral and popliteal veins. COMPARISON:  None. FINDINGS: Contralateral Common Femoral Vein: Respiratory phasicity is normal and symmetric with the symptomatic side. No evidence of thrombus. Normal compressibility. Common Femoral Vein: No evidence of thrombus. Saphenofemoral Junction: No evidence of thrombus. Profunda Femoral Vein: No evidence of thrombus. Femoral Vein: No evidence of thrombus. Popliteal Vein: No evidence of thrombus. Calf Veins: No evidence of thrombus. IMPRESSION: No evidence of DVT within the left lower extremity. Electronically Signed   By: Monte Fantasia M.D.   On: 05/25/2017 16:58      Management plans discussed with the patient, family and they are in agreement.  CODE STATUS:     Code Status Orders        Start     Ordered   05/25/17 1951  Full code  Continuous     05/25/17 1950        Advance Directive  Documentation     Most Recent Value  Type of Advance Directive  Healthcare Power of Attorney, Living will  Pre-existing out of facility DNR order (yellow form or pink MOST form)  -  "MOST" Form in Place?  -  TOTAL TIME TAKING CARE OF THIS PATIENT: 40 minutes.    Henreitta Leber M.D on 05/27/2017 at 2:07 PM  Between 7am to 6pm - Pager - [phone]  After 6pm go to [email or web address] - Proofreader  [facility]  Office  [phone]  CC: Primary care physician; Dion Body, MD

## 2017-05-27 NOTE — Progress Notes (Signed)
Pt discharged to home via wc.  Instructions  given to pt.  Questions answered.  No distress.  

## 2017-05-27 NOTE — Care Management (Signed)
Notified [facility] of discharge and that order is present for SN and PT

## 2017-05-27 NOTE — Plan of Care (Signed)
Problem: Safety: Goal: Ability to remain free from injury will improve Outcome: Progressing Fall precautions in place, non skid socks when oob  Problem: Pain Managment: Goal: General experience of comfort will improve Outcome: Not Progressing Prn medications

## 2017-05-27 NOTE — Plan of Care (Signed)
Problem: Safety: Goal: Ability to remain free from injury will improve Outcome: Progressing Up to bathroom with standby assistance.  Gait steady.

## 2017-05-27 NOTE — Progress Notes (Signed)
Notified per CCMD of heart block on telemetry.  Pt asleep and snoring.  Awakens easily.  Skin warm, dry. VSS.

## 2017-05-27 NOTE — Progress Notes (Signed)
ANTICOAGULATION CONSULT NOTE - Initial Consult  Pharmacy Consult for warfarin Indication: mechanical heart valve  Allergies  Allergen Reactions  . Amiodarone Other (See Comments)    Pt states that this medication causes lung bleeding.      Patient Measurements: Height: 5\' 9"  (175.3 cm) Weight: 241 lb 8 oz (109.5 kg) IBW/kg (Calculated) : 66.2 Heparin Dosing Weight:   Vital Signs: Temp: 98.5 F (36.9 C) (06/05 0829) Temp Source: Oral (06/05 0829) BP: 131/65 (06/05 0829) Pulse Rate: 87 (06/05 0829)  Labs:  Recent Labs  05/25/17 1609 05/25/17 2018 05/26/17 0229 05/26/17 0735 05/27/17 0436  HGB 8.6*  --  9.1*  --   --   HCT 27.8*  --  29.4*  --   --   PLT 330  --  313  --   --   APTT 32  --   --   --   --   LABPROT 22.8*  --  23.5*  --  29.1*  INR 1.98  --  2.06  --  2.69  CREATININE 1.52*  --  2.15*  --   --   TROPONINI 0.03* <0.03 <0.03 <0.03  --     Estimated Creatinine Clearance: 32.1 mL/min (A) (by C-G formula based on SCr of 2.15 mg/dL (H)).   Assessment: Pharmacy consulted to dose warfarin in this 71 year old female with mechanical mitral valve.   Date  INR  Warfarin Dose (mg) 6/3  1.98  7.5  6/4  2.06  7.5 6/5  2.69  Goal of Therapy:  INR :  2.5 - 3.5   Plan:  Will resume PTA dosing of warfarin 5 mg daily except 7.5 mg Thursday and continue to check daily INRs.  Ulice Dash D 05/27/2017,9:09 AM

## 2017-06-17 ENCOUNTER — Other Ambulatory Visit: Payer: Self-pay | Admitting: Otolaryngology

## 2017-06-17 DIAGNOSIS — E041 Nontoxic single thyroid nodule: Secondary | ICD-10-CM

## 2017-06-23 ENCOUNTER — Ambulatory Visit: Payer: Medicare Other

## 2017-07-14 ENCOUNTER — Ambulatory Visit (INDEPENDENT_AMBULATORY_CARE_PROVIDER_SITE_OTHER): Payer: Medicare Other | Admitting: Vascular Surgery

## 2017-07-14 ENCOUNTER — Encounter (INDEPENDENT_AMBULATORY_CARE_PROVIDER_SITE_OTHER): Payer: Self-pay | Admitting: Vascular Surgery

## 2017-07-14 VITALS — BP 85/50 | HR 61 | Resp 16 | Wt 238.0 lb

## 2017-07-14 DIAGNOSIS — M79604 Pain in right leg: Secondary | ICD-10-CM | POA: Diagnosis not present

## 2017-07-14 DIAGNOSIS — I10 Essential (primary) hypertension: Secondary | ICD-10-CM | POA: Diagnosis not present

## 2017-07-14 DIAGNOSIS — I739 Peripheral vascular disease, unspecified: Secondary | ICD-10-CM

## 2017-07-14 DIAGNOSIS — I482 Chronic atrial fibrillation, unspecified: Secondary | ICD-10-CM

## 2017-07-14 DIAGNOSIS — I872 Venous insufficiency (chronic) (peripheral): Secondary | ICD-10-CM | POA: Diagnosis not present

## 2017-07-14 DIAGNOSIS — M79605 Pain in left leg: Secondary | ICD-10-CM

## 2017-07-14 DIAGNOSIS — M79606 Pain in leg, unspecified: Secondary | ICD-10-CM | POA: Insufficient documentation

## 2017-07-14 DIAGNOSIS — E119 Type 2 diabetes mellitus without complications: Secondary | ICD-10-CM | POA: Diagnosis not present

## 2017-07-14 NOTE — Progress Notes (Signed)
MRN : [id number]  Betty [name] is a 71 y.o. (05-11-1946) female who presents with chief complaint of  Chief Complaint  Patient presents with  . Leg Pain  .  History of Present Illness: The patient is seen for evaluation of painful lower extremities. Patient notes the pain is variable and not always associated with activity.  The pain is somewhat consistent day to day occurring on most days. The patient notes the pain also occurs with standing and routinely seems worse as the day wears on. The pain has been progressive over the past several years. The patient states these symptoms are causing  a profound negative impact on quality of life and daily activities.  The patient denies rest pain or dangling of an extremity off the side of the bed during the night for relief. No open wounds or sores at this time. No history of DVT or phlebitis. No prior interventions or surgeries.  There is a  history of back problems and DJD of the lumbar and sacral spine.    Current Meds  Medication Sig  . albuterol (PROVENTIL HFA;VENTOLIN HFA) 108 (90 Base) MCG/ACT inhaler Inhale 2 puffs into the lungs every 4 (four) hours as needed for wheezing or shortness of breath.  Marland Kitchen albuterol (PROVENTIL) (2.5 MG/3ML) 0.083% nebulizer solution Take 3 mLs (2.5 mg total) by nebulization every 4 (four) hours as needed for wheezing or shortness of breath.  Marland Kitchen aspirin EC 81 MG tablet Take 81 mg by mouth daily.  Marland Kitchen atorvastatin (LIPITOR) 20 MG tablet Take 20 mg by mouth daily.  Marland Kitchen azaTHIOprine (IMURAN) 50 MG tablet Take 100 mg by mouth daily.   . calcium-vitamin D (OSCAL WITH D) 500-200 MG-UNIT per tablet Take 1 tablet by mouth daily.  Marland Kitchen diltiazem (CARDIZEM LA) 180 MG 24 hr tablet Take 180 mg by mouth daily.   . ferrous sulfate 325 (65 FE) MG tablet Take 325 mg by mouth daily with breakfast.  . fluticasone (FLONASE) 50 MCG/ACT nasal spray Place into the nose.  . gabapentin (NEURONTIN) 600 MG tablet Take 600 mg by mouth 2  (two) times daily.  . Lancets 30G MISC Use 1 Units as directed. Check CBG's fasting once daily. Dx: E11.9  . loratadine (CLARITIN) 10 MG tablet Take 10 mg by mouth daily.  . magnesium oxide (MAG-OX) 400 (241.3 Mg) MG tablet TAKE 1 TABLET (400 MG TOTAL) BY MOUTH ONCE DAILY.  . metFORMIN (GLUCOPHAGE) 1000 MG tablet Take 1,000 mg by mouth 2 (two) times daily with a meal.   . metolazone (ZAROXOLYN) 2.5 MG tablet Take 2.5 mg by mouth 2 (two) times a week. Take 2.5 mg by mouth on Monday and Thursday.  . metoprolol tartrate (LOPRESSOR) 25 MG tablet Take 0.5 tablets (12.5 mg total) by mouth 2 (two) times daily. (Patient taking differently: Take 25 mg by mouth daily. )  . oxybutynin (DITROPAN) 5 MG tablet Take 5 mg by mouth 2 (two) times daily.  . pantoprazole (PROTONIX) 40 MG tablet Take 40 mg by mouth daily.  . potassium chloride (K-DUR) 10 MEQ tablet Take 2 tablets (20 mEq total) by mouth daily. While on lasix  . propafenone (RYTHMOL) 225 MG tablet Take 225 mg by mouth every 12 (twelve) hours.  Marland Kitchen rOPINIRole (REQUIP) 2 MG tablet Take 1 tablet (2 mg total) by mouth at bedtime as needed (restless leg syndrome). (Patient taking differently: Take 3 mg by mouth at bedtime. )  . spironolactone (ALDACTONE) 50 MG tablet Take 50 mg by  mouth daily.  Marland Kitchen torsemide (DEMADEX) 20 MG tablet Take 80 mg by mouth 2 (two) times daily.   . traZODone (DESYREL) 100 MG tablet Take 200 mg by mouth at bedtime.  . Venlafaxine HCl 225 MG TB24 Take 225 mg by mouth daily.  . Vitamin D, Ergocalciferol, (DRISDOL) 50000 units CAPS capsule TAKE ONE CAPSULE BY MOUTH ONE TIME PER WEEK  . warfarin (COUMADIN) 5 MG tablet Take 5 mg by mouth daily. 5 mg everyday except Tuesday and Thursday  . warfarin (COUMADIN) 6 MG tablet Take by mouth.    Past Medical History:  Diagnosis Date  . Acute diastolic heart failure ([place])   . Allergy   . 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 Asthma   . Atrial fibrillation ([place])   . Backache, unspecified   .  Cardiomegaly   . COPD (chronic obstructive pulmonary disease) ([place])   . Diabetes mellitus without complication ([place])   . 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 Esophageal reflux   . Headache(784.0)   . Herpes zoster without mention of complication   . Hx: UTI (urinary tract infection)   . Hypertension    heart controlled w CHF  . Nontoxic uninodular goiter   . Obesity, unspecified   . Osteoarthrosis, unspecified whether generalized or localized, unspecified site   . Unspecified sleep apnea   . Urine incontinence    hx of    Past Surgical History:  Procedure Laterality Date  . ABDOMINAL HYSTERECTOMY  1979   complete (for precancerous cells)  . ABLATION  2011 & 2014  . CHOLECYSTECTOMY    . OOPHORECTOMY      Social History Social History  Substance Use Topics  . Smoking status: Former Smoker    Packs/day: 0.50    Years: 15.00    Types: Cigarettes  . Smokeless tobacco: Never Used     Comment: Has a 20-pack-year history, qutting in 1970.   Marland Kitchen Alcohol use No    Family History Family History  Problem Relation Age of Onset  . Heart attack Father   . Heart failure Father   . Arthritis Father   . Stroke Father   . Hypertension Father   . Coronary artery disease Brother   . Peripheral vascular disease Brother   . Arthritis Mother   . Colon cancer Mother        colon cancer  . Hypertension Mother   . Cancer Maternal Grandmother        colon cancer  . Arthritis Maternal Grandmother     Allergies  Allergen Reactions  . Amiodarone Other (See Comments)    Pt states that this medication causes lung bleeding.    . Flecainide Other (See Comments)     REVIEW OF SYSTEMS (Negative unless checked)  Constitutional: [] Weight loss  [] Fever  [] Chills Cardiac: [] Chest pain   [] Chest pressure   [] Palpitations   [] Shortness of breath when laying flat   [] Shortness of breath with exertion. Vascular:  [x] Pain in legs with walking   [x] Pain in legs at  rest  [] History of DVT   [] Phlebitis   [x] Swelling in legs   [] Varicose veins   [] Non-healing ulcers Pulmonary:   [] Uses home oxygen   [] Productive cough   [] Hemoptysis   [] Wheeze  [] COPD   [] Asthma Neurologic:  [] Dizziness   [] Seizures   [] History of stroke   [] History of TIA  [] Aphasia   [] Vissual changes   [] Weakness or numbness in arm   []   Weakness or numbness in leg Musculoskeletal:   [] Joint swelling   [x] Joint pain   [x] Low back pain Hematologic:  [] Easy bruising  [] Easy bleeding   [] Hypercoagulable state   [] Anemic Gastrointestinal:  [] Diarrhea   [] Vomiting  [] Gastroesophageal reflux/heartburn   [] Difficulty swallowing. Genitourinary:  [] Chronic kidney disease   [] Difficult urination  [] Frequent urination   [] Blood in urine Skin:  [] Rashes   [] Ulcers  Psychological:  [] History of anxiety   []  History of major depression.  Physical Examination  Vitals:   07/14/17 1633  BP: (!) 85/50  Pulse: 61  Resp: 16  Weight: 238 lb (108 kg)   Body mass index is 35.15 kg/m. Gen: WD/WN, NAD Head: [employer]/AT, No temporalis wasting.  Ear/Nose/Throat: Hearing grossly intact, nares w/o erythema or drainage Eyes: PER, EOMI, sclera nonicteric.  Neck: Supple, no large masses.   Pulmonary:  Good air movement, no audible wheezing bilaterally, no use of accessory muscles.  Cardiac: RRR, no JVD Vascular: Large varicosities present.  Mild venous stasis changes to the legs bilaterally.  2+ soft pitting edema Vessel Right Left  Radial Palpable Palpable  PT Not Palpable Not Palpable  DP Not Palpable Not Palpable  Gastrointestinal: Non-distended. No guarding/no peritoneal signs.  Musculoskeletal: M/S 5/5 throughout.  No deformity or atrophy.  Neurologic: CN 2-12 intact. Symmetrical.  Speech is fluent. Motor exam as listed above. Psychiatric: Judgment intact, Mood & affect appropriate for pt's clinical situation. Dermatologic: No rashes or ulcers noted.  No changes consistent with cellulitis. Lymph : No  lichenification or skin changes of chronic lymphedema.  CBC Lab Results  Component Value Date   WBC 12.3 (H) 05/26/2017   HGB 9.1 (L) 05/26/2017   HCT 29.4 (L) 05/26/2017   MCV 73.3 (L) 05/26/2017   PLT 313 05/26/2017    BMET    Component Value Date/Time   NA 135 05/27/2017 0436   K 3.8 05/27/2017 0436   K 3.7 09/27/2014 1446   CL 98 (L) 05/27/2017 0436   CO2 30 05/27/2017 0436   GLUCOSE 146 (H) 05/27/2017 0436   BUN 43 (H) 05/27/2017 0436   CREATININE 0.97 05/27/2017 0436   CALCIUM 8.6 (L) 05/27/2017 0436   GFRNONAA 58 (L) 05/27/2017 0436   GFRAA >60 05/27/2017 0436   CrCl cannot be calculated (Patient's most recent lab result is older than the maximum 21 days allowed.).  COAG Lab Results  Component Value Date   INR 2.69 05/27/2017   INR 2.06 05/26/2017   INR 1.98 05/25/2017    Radiology No results found.  Assessment/Plan 1. Pain in both lower extremities  Recommend:  The patient has atypical pain symptoms for pure atherosclerotic disease. However, on physical exam there is evidence of mixed venous and arterial disease, given the diminished pulses and the edema associated with venous changes of the legs.  Noninvasive studies including ABI's and venous ultrasound of the legs will be obtained and the patient will follow up with me to review these studies.  The patient should continue walking and begin a more formal exercise program. The patient should continue his antiplatelet therapy and aggressive treatment of the lipid abnormalities.  The patient should begin wearing graduated compression socks 15-20 mmHg strength to control edema.   - VAS [place] ABI WITH/WO TBI; Future  2. Chronic venous insufficiency No surgery or intervention at this point in time.    I have had a long discussion with the patient regarding venous insufficiency and why it  causes symptoms. I have discussed with the  patient the chronic skin changes that accompany venous insufficiency and the  long term sequela such as infection and ulceration.  Patient will begin wearing graduated compression stockings class 1 (20-30 mmHg) or compression wraps on a daily basis a prescription was given. The patient will put the stockings on first thing in the morning and removing them in the evening. The patient is instructed specifically not to sleep in the stockings.    In addition, behavioral modification including several periods of elevation of the lower extremities during the day will be continued. I have demonstrated that proper elevation is a position with the ankles at heart level.  The patient is instructed to begin routine exercise, especially walking on a daily basis  - VAS [place] LOWER EXTREMITY VENOUS REFLUX; Future  3. PAD (peripheral artery disease) (HCC)  Recommend:  The patient has atypical pain symptoms for pure atherosclerotic disease. However, on physical exam there is evidence of mixed venous and arterial disease, given the diminished pulses and the edema associated with venous changes of the legs.  Noninvasive studies including ABI's and venous ultrasound of the legs will be obtained and the patient will follow up with me to review these studies.  The patient should continue walking and begin a more formal exercise program. The patient should continue his antiplatelet therapy and aggressive treatment of the lipid abnormalities.  The patient should begin wearing graduated compression socks 15-20 mmHg strength to control edema.   4. Chronic atrial fibrillation (HCC) Continue antiarrhythmia medications as already ordered, these medications have been reviewed and there are no changes at this time.  Continue anticoagulation as ordered by Cardiology Service   5. Essential hypertension, benign Continue antihypertensive medications as already ordered, these medications have been reviewed and there are no changes at this time.   6. Type 2 diabetes mellitus without complication,  unspecified whether long term insulin use ([place]) Continue hypoglycemic medications as already ordered, these medications have been reviewed and there are no changes at this time.  Hgb A1C to be monitored as already arranged by primary service     Hortencia Pilar, MD  07/14/2017 8:46 PM

## 2017-07-16 ENCOUNTER — Other Ambulatory Visit: Payer: Self-pay

## 2017-07-19 ENCOUNTER — Observation Stay
Admission: EM | Admit: 2017-07-19 | Discharge: 2017-07-20 | Disposition: A | Discharge status: Home / Self Care | Payer: Medicare Other | Attending: Internal Medicine | Admitting: Internal Medicine

## 2017-07-19 ENCOUNTER — Emergency Department: Payer: Medicare Other

## 2017-07-19 ENCOUNTER — Encounter: Payer: Self-pay | Admitting: Emergency Medicine

## 2017-07-19 DIAGNOSIS — I776 Arteritis, unspecified: Secondary | ICD-10-CM | POA: Diagnosis not present

## 2017-07-19 DIAGNOSIS — K219 Gastro-esophageal reflux disease without esophagitis: Secondary | ICD-10-CM | POA: Diagnosis not present

## 2017-07-19 DIAGNOSIS — Z6836 Body mass index (BMI) 36.0-36.9, adult: Secondary | ICD-10-CM | POA: Insufficient documentation

## 2017-07-19 DIAGNOSIS — Z952 Presence of prosthetic heart valve: Secondary | ICD-10-CM | POA: Insufficient documentation

## 2017-07-19 DIAGNOSIS — J449 Chronic obstructive pulmonary disease, unspecified: Secondary | ICD-10-CM | POA: Diagnosis not present

## 2017-07-19 DIAGNOSIS — T148XXA Other injury of unspecified body region, initial encounter: Secondary | ICD-10-CM

## 2017-07-19 DIAGNOSIS — Z87891 Personal history of nicotine dependence: Secondary | ICD-10-CM | POA: Diagnosis not present

## 2017-07-19 DIAGNOSIS — Z7982 Long term (current) use of aspirin: Secondary | ICD-10-CM | POA: Insufficient documentation

## 2017-07-19 DIAGNOSIS — W010XXA Fall on same level from slipping, tripping and stumbling without subsequent striking against object, initial encounter: Secondary | ICD-10-CM | POA: Diagnosis not present

## 2017-07-19 DIAGNOSIS — E1151 Type 2 diabetes mellitus with diabetic peripheral angiopathy without gangrene: Secondary | ICD-10-CM | POA: Diagnosis not present

## 2017-07-19 DIAGNOSIS — G4733 Obstructive sleep apnea (adult) (pediatric): Secondary | ICD-10-CM | POA: Diagnosis not present

## 2017-07-19 DIAGNOSIS — G2581 Restless legs syndrome: Secondary | ICD-10-CM | POA: Diagnosis not present

## 2017-07-19 DIAGNOSIS — E7439 Other disorders of intestinal carbohydrate absorption: Secondary | ICD-10-CM | POA: Diagnosis not present

## 2017-07-19 DIAGNOSIS — R55 Syncope and collapse: Principal | ICD-10-CM

## 2017-07-19 DIAGNOSIS — E785 Hyperlipidemia, unspecified: Secondary | ICD-10-CM | POA: Diagnosis not present

## 2017-07-19 DIAGNOSIS — I110 Hypertensive heart disease with heart failure: Secondary | ICD-10-CM | POA: Insufficient documentation

## 2017-07-19 DIAGNOSIS — M1990 Unspecified osteoarthritis, unspecified site: Secondary | ICD-10-CM | POA: Insufficient documentation

## 2017-07-19 DIAGNOSIS — Z7901 Long term (current) use of anticoagulants: Secondary | ICD-10-CM | POA: Insufficient documentation

## 2017-07-19 DIAGNOSIS — Z7952 Long term (current) use of systemic steroids: Secondary | ICD-10-CM | POA: Insufficient documentation

## 2017-07-19 DIAGNOSIS — S022XXA Fracture of nasal bones, initial encounter for closed fracture: Secondary | ICD-10-CM

## 2017-07-19 DIAGNOSIS — I5032 Chronic diastolic (congestive) heart failure: Secondary | ICD-10-CM | POA: Insufficient documentation

## 2017-07-19 DIAGNOSIS — I480 Paroxysmal atrial fibrillation: Secondary | ICD-10-CM | POA: Diagnosis not present

## 2017-07-19 DIAGNOSIS — W19XXXA Unspecified fall, initial encounter: Secondary | ICD-10-CM

## 2017-07-19 DIAGNOSIS — Z794 Long term (current) use of insulin: Secondary | ICD-10-CM | POA: Insufficient documentation

## 2017-07-19 DIAGNOSIS — S0990XA Unspecified injury of head, initial encounter: Secondary | ICD-10-CM

## 2017-07-19 DIAGNOSIS — E861 Hypovolemia: Secondary | ICD-10-CM | POA: Diagnosis not present

## 2017-07-19 DIAGNOSIS — E871 Hypo-osmolality and hyponatremia: Secondary | ICD-10-CM | POA: Diagnosis not present

## 2017-07-19 DIAGNOSIS — S0083XA Contusion of other part of head, initial encounter: Secondary | ICD-10-CM | POA: Insufficient documentation

## 2017-07-19 DIAGNOSIS — R188 Other ascites: Secondary | ICD-10-CM | POA: Insufficient documentation

## 2017-07-19 DIAGNOSIS — Z79899 Other long term (current) drug therapy: Secondary | ICD-10-CM | POA: Insufficient documentation

## 2017-07-19 DIAGNOSIS — E669 Obesity, unspecified: Secondary | ICD-10-CM | POA: Insufficient documentation

## 2017-07-19 DIAGNOSIS — J849 Interstitial pulmonary disease, unspecified: Secondary | ICD-10-CM | POA: Diagnosis not present

## 2017-07-19 LAB — COMPREHENSIVE METABOLIC PANEL
ALBUMIN: 3.6 g/dL (ref 3.5–5.0)
ALK PHOS: 75 U/L (ref 38–126)
ALT: 49 U/L (ref 14–54)
AST: 89 U/L — AB (ref 15–41)
Anion gap: 11 (ref 5–15)
BILIRUBIN TOTAL: 1 mg/dL (ref 0.3–1.2)
BUN: 32 mg/dL — AB (ref 6–20)
CALCIUM: 8.2 mg/dL — AB (ref 8.9–10.3)
CO2: 30 mmol/L (ref 22–32)
CREATININE: 1.21 mg/dL — AB (ref 0.44–1.00)
Chloride: 91 mmol/L — ABNORMAL LOW (ref 101–111)
GFR calc Af Amer: 51 mL/min — ABNORMAL LOW (ref >60)
GFR calc non Af Amer: 44 mL/min — ABNORMAL LOW (ref >60)
GLUCOSE: 136 mg/dL — AB (ref 65–99)
POTASSIUM: 3.9 mmol/L (ref 3.5–5.1)
SODIUM: 132 mmol/L — AB (ref 135–145)
TOTAL PROTEIN: 6.3 g/dL — AB (ref 6.5–8.1)

## 2017-07-19 LAB — CBC WITH DIFFERENTIAL/PLATELET
BASOS ABS: 0.1 10*3/uL (ref 0–0.1)
Basophils Relative: 1 %
EOS ABS: 0.1 10*3/uL (ref 0–0.7)
EOS PCT: 1 %
HCT: 30.4 % — ABNORMAL LOW (ref 35.0–47.0)
HEMOGLOBIN: 9.6 g/dL — AB (ref 12.0–16.0)
Lymphocytes Relative: 5 %
Lymphs Abs: 0.4 10*3/uL — ABNORMAL LOW (ref 1.0–3.6)
MCH: 24.1 pg — ABNORMAL LOW (ref 26.0–34.0)
MCHC: 31.8 g/dL — ABNORMAL LOW (ref 32.0–36.0)
MCV: 75.7 fL — ABNORMAL LOW (ref 80.0–100.0)
Monocytes Absolute: 0.9 10*3/uL (ref 0.2–0.9)
Monocytes Relative: 10 %
NEUTROS PCT: 83 %
Neutro Abs: 7.4 10*3/uL — ABNORMAL HIGH (ref 1.4–6.5)
PLATELETS: 269 10*3/uL (ref 150–440)
RBC: 4.01 MIL/uL (ref 3.80–5.20)
RDW: 24.2 % — ABNORMAL HIGH (ref 11.5–14.5)
WBC: 8.9 10*3/uL (ref 3.6–11.0)

## 2017-07-19 LAB — PROTIME-INR
INR: 2.12
Prothrombin Time: 24.1 seconds — ABNORMAL HIGH (ref 11.4–15.2)

## 2017-07-19 LAB — TROPONIN I: Troponin I: <0.03 ng/mL (ref <0.03)

## 2017-07-19 MED ORDER — TETANUS-DIPHTH-ACELL PERTUSSIS 5-2.5-18.5 LF-MCG/0.5 IM SUSP
0.5000 mL | Freq: Once | INTRAMUSCULAR | Status: AC
  Administered 2017-07-19: 0.5 mL via INTRAMUSCULAR
  Filled 2017-07-19: qty 0.5

## 2017-07-19 MED ORDER — OXYCODONE-ACETAMINOPHEN 5-325 MG PO TABS
2.0000 | ORAL_TABLET | Freq: Once | ORAL | Status: AC
  Administered 2017-07-19: 2 via ORAL
  Filled 2017-07-19: qty 2

## 2017-07-19 MED ORDER — ONDANSETRON 4 MG PO TBDP
4.0000 mg | ORAL_TABLET | Freq: Once | ORAL | Status: AC
  Administered 2017-07-19: 4 mg via ORAL
  Filled 2017-07-19: qty 1

## 2017-07-19 MED ORDER — OXYMETAZOLINE HCL 0.05 % NA SOLN
1.0000 | Freq: Once | NASAL | Status: AC
  Administered 2017-07-19: 1 via NASAL
  Filled 2017-07-19: qty 15

## 2017-07-19 MED ORDER — SODIUM CHLORIDE 0.9 % IV SOLN
1000.0000 mL | Freq: Once | INTRAVENOUS | Status: AC
  Administered 2017-07-19: 1000 mL via INTRAVENOUS

## 2017-07-19 NOTE — ED Notes (Signed)
This RN attempted to place an IV in the patient, when RN was in the room, pt stated she wanted to use the bathroom. Pt was requested to use the bedpan because of the earlier near syncopal episode while ambulating and need to collect urine sample. Pt agreed to use the bed pan; prior the bed pan placement, pt became incontinent and urinated. Pt's clothing was removed, bed linens were removed, pt was cleaned up, bed linens were changed and pt was put on briefs. Pt's clothing put in patient belonging bag and given to family members. This RN attempted twice to put an IV into the patient and was unsuccessful both times. Another RN asked to place IV in patient. Pt taken to X-ray at this time. Staff RN will attempt to place an IV in patient when the patient returns.

## 2017-07-19 NOTE — ED Notes (Signed)
Ambulated patient from the stretcher to the end of the hallway, approximately 15 feet, and back to the stretcher while monitoring O2 sats. Pt's O2 sats on room air before ambulation noted to be at 93%, while ambulating pt's O2 sats dropped to 88%. Pt also had a near syncopal episode while ambulating back to the stretcher. Pt was assisted to the stretcher with the help of her son, this Therapist, sports and another ER RN. Pt placed back on O2 via [place] at 2L and MD notified of the situation.

## 2017-07-19 NOTE — ED Provider Notes (Signed)
[facility] Emergency Department Provider Note       Time seen: ----------------------------------------- 7:09 PM on 07/19/2017 -----------------------------------------     I have reviewed the triage vital signs and the nursing notes.   HISTORY   Chief Complaint No chief complaint on file.    HPI Betty [name] is a 71 y.o. female who presents to the ED for a fall. Patient states [place] [employer] [place] when she tripped over her feet and fell face forward. Patient sustained significant facial abrasion and injury. She denies any loss of consciousness does complain of facial pain. She denies a history of this before, did have a nosebleed in route which has resolved spontaneously. Currently her nosebleed has stopped.   Past Medical History:  Diagnosis Date  . Acute diastolic heart failure ([place])   . Allergy   . 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 Asthma   . Atrial fibrillation ([place])   . Backache, unspecified   . Cardiomegaly   . COPD (chronic obstructive pulmonary disease) ([place])   . Diabetes mellitus without complication ([place])   . 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 Esophageal reflux   . Headache(784.0)   . Herpes zoster without mention of complication   . Hx: UTI (urinary tract infection)   . Hypertension    heart controlled w CHF  . Nontoxic uninodular goiter   . Obesity, unspecified   . Osteoarthrosis, unspecified whether generalized or localized, unspecified site   . Unspecified sleep apnea   . Urine incontinence    hx of    Patient Active Problem List   Diagnosis Date Noted  . Leg pain 07/14/2017  . Chronic venous insufficiency 07/14/2017  . PAD (peripheral artery disease) ([place]) 07/14/2017  . Postprocedural hemorrhage due to complication of oral surgery 03/27/2017  . Acute blood loss anemia 03/27/2017  . H/O mitral valve replacement with mechanical valve 03/27/2017  . Syncope 06/25/2016  . UTI  (lower urinary tract infection) 06/25/2016  . A-fib ([place]) 05/10/2016  . Dyspnea 05/10/2016  . Acute diastolic CHF (congestive heart failure) ([place]) 05/10/2016  . Anemia 05/10/2016  . Anemia 05/10/2016  . Other specified abnormal immunological findings in serum 04/24/2016  . Sepsis ([place]) 04/01/2016  . Prolonged Q-T interval on ECG 03/13/2016  . Mitral stenosis 03/13/2016  . Essential hypertension, benign 03/13/2016  . Diastolic congestive heart failure ([place]) 03/13/2016  . Interstitial lung disease ([place]) 12/29/2015  . Chronic LBP 07/26/2015  . Essential (primary) hypertension 07/26/2015  . Idiopathic insomnia 07/26/2015  . Restless leg 07/26/2015  . Obesity (BMI 30-39.9) 01/07/2015  . Abnormal EKG 09/11/2014  . External nasal lesion 02/26/2014  . Abnormal liver function tests 01/28/2014  . Ankle pain, right 01/28/2014  . Arthralgia of ankle or foot 01/28/2014  . Urinary frequency 12/24/2013  . Hemorrhoids 12/24/2013  . Neck pain on right side 11/10/2013  . Vitamin D deficiency 09/09/2013  . Obstructive sleep apnea 05/30/2013  . Urinary incontinence 05/30/2013  . Diabetes ([place]) 05/30/2013  . History of colonic polyps 05/30/2013  . Controlled type 2 diabetes mellitus without complication ([place]) 79/89/2119  . H/O deep venous thrombosis 05/05/2013  . Depression 04/13/2009  . THYROID NODULE 04/13/2009  . 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 GERD 04/13/2009  . OSTEOARTHRITIS 04/13/2009  . BACK PAIN 04/13/2009  . HEADACHE 04/13/2009  . Gastro-esophageal reflux disease without esophagitis 04/13/2009  . Mild episode of recurrent major depressive disorder ([place]) 04/13/2009  . 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 ATRIAL FIBRILLATION 03/28/2009  . DIASTOLIC HEART FAILURE, ACUTE 03/28/2009  . VENTRICULAR HYPERTROPHY, LEFT 03/28/2009    Past Surgical History:  Procedure Laterality Date  . ABDOMINAL HYSTERECTOMY  1979   complete (for  precancerous cells)  . ABLATION  2011 & 2014  . CHOLECYSTECTOMY    . OOPHORECTOMY      Allergies Amiodarone and Flecainide  Social History Social History  Substance Use Topics  . Smoking status: Former Smoker    Packs/day: 0.50    Years: 15.00    Types: Cigarettes  . Smokeless tobacco: Never Used     Comment: Has a 20-pack-year history, qutting in 1970.   Marland Kitchen Alcohol use No    Review of Systems Constitutional: Negative for fever. Eyes: Negative for vision changes ENT:  Negative for congestion, sore throat. Positive for recent nosebleed Cardiovascular: Negative for chest pain. Respiratory: Negative for shortness of breath. Gastrointestinal: Negative for abdominal pain, vomiting and diarrhea. Genitourinary: Negative for dysuria. Musculoskeletal: Positive for facial pain Skin: Negative for rash. Neurological: Positive for headache  All systems negative/normal/unremarkable except as stated in the HPI  ____________________________________________   PHYSICAL EXAM:  VITAL SIGNS: ED Triage Vitals  Enc Vitals Group     BP      Pulse      Resp      Temp      Temp src      SpO2      Weight      Height      Head Circumference      Peak Flow      Pain Score      Pain Loc      Pain Edu?      Excl. in [place]?     Constitutional: Alert and oriented. No acute distress Eyes: Conjunctivae are normal. Normal extraocular movements. ENT   Head: Normocephalic, extensive facial abrasions and contusions   Nose: Blood is noted on the nasal septum bilaterally. No septal hematoma. Nose is enlarged and contused   Mouth/Throat: Mucous membranes are moist. No blood in the posterior pharynx   Neck: No stridor. Cardiovascular: artificial heart valve murmur is present Respiratory: Normal respiratory effort without tachypnea nor retractions. Breath sounds are clear and equal bilaterally. No wheezes/rales/rhonchi. Gastrointestinal: Soft and nontender. Normal bowel  sounds Musculoskeletal: Nontender with normal range of motion in extremities. No lower extremity tenderness nor edema. Neurologic:  Normal speech and language. No gross focal neurologic deficits are appreciated.  Skin:  Extensive facial abrasions and contusions. Abrasion is appreciated over the left knee Psychiatric: Mood and affect are normal. Speech and behavior are normal.  ____________________________________________  ED COURSE:  Pertinent labs & imaging results that were available during my care of the patient were reviewed by me and considered in my medical decision making (see chart for details). Patient presents for a fall with head and facial injuries, we will assess with imaging as indicated. Clinical Course as of Jul 20 2251  Sat Jul 19, 2017  2205 Initially the patient's x-rays were unremarkable except for nasal fracture, she was subsequently ambulated and had a syncopal event. We are ordering IV fluids as well as basic labs.  [JW]    Clinical Course User Index [JW] Earleen Newport, MD   Procedures ____________________________________________    Labs Reviewed  CBC WITH DIFFERENTIAL/PLATELET  COMPREHENSIVE METABOLIC PANEL  TROPONIN I  URINALYSIS, COMPLETE (UACMP) WITH MICROSCOPIC  PROTIME-INR     RADIOLOGY Images  were viewed by me  CT head, maxillofacial IMPRESSION: 1. 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 2. Minimally displaced fractures of the nasal bone with midline frontal and left facial hematomas.  Normal hip and ankle x-rays  ____________________________________________  FINAL ASSESSMENT AND PLAN  Fall, head injury, facial abrasions, epistaxis, syncope, nasal bone fracture  Plan: Patient'sLabs and imaging were dictated as above. Patient had presented for a mechanical fall occurred [place] of [place]. Initial x-rays were negative except for nasal fracture. Patient had a syncopal event  while attempting to ambulate her, we will check basic labs and give IV fluid.   Earleen Newport, MD   Note: This note was generated in part or whole with voice recognition software. Voice recognition is usually quite accurate but there are transcription errors that can and very often do occur. I apologize for any typographical errors that were not detected and corrected.     Earleen Newport, MD 07/19/17 [phone]

## 2017-07-19 NOTE — ED Notes (Signed)
Pt went to CT

## 2017-07-19 NOTE — ED Notes (Signed)
This EDT assisted pt to restroom after answering call light. Pt was stable standing although c/o dizzyness after returning to bed. Pt stats (O2) dropped to 85 on excertion, RN notified. Stats leveled out to normal range 1 min after returning to bed

## 2017-07-19 NOTE — ED Triage Notes (Signed)
Pt presents to ED 26 via EMS as a result of a mechanical fall occurring around 1835 coming out of [employer] dept store. Pt is awake, alert and oriented. Pt has obvious bruising on left side of face and an abrasion on left knee. Pt c/o pain in left face/head, knee, leg, arm, and back. Per EMS pt's VS on scene were WDL.

## 2017-07-20 LAB — AMMONIA: Ammonia: 30 umol/L (ref 9–35)

## 2017-07-20 LAB — URINALYSIS, COMPLETE (UACMP) WITH MICROSCOPIC
BILIRUBIN URINE: NEGATIVE
Glucose, UA: NEGATIVE mg/dL
Ketones, ur: NEGATIVE mg/dL
Nitrite: NEGATIVE
PROTEIN: NEGATIVE mg/dL
SPECIFIC GRAVITY, URINE: 1.006 (ref 1.005–1.030)
pH: 6 (ref 5.0–8.0)

## 2017-07-20 LAB — TSH: TSH: 3.249 u[IU]/mL (ref 0.350–4.500)

## 2017-07-20 LAB — GLUCOSE, CAPILLARY
Glucose-Capillary: 227 mg/dL — ABNORMAL HIGH (ref 65–99)
Glucose-Capillary: 183 mg/dL — ABNORMAL HIGH (ref 65–99)

## 2017-07-20 MED ORDER — INSULIN ASPART 100 UNIT/ML ~~LOC~~ SOLN: 0.0000 [IU] | Freq: Every day | SUBCUTANEOUS | Status: DC

## 2017-07-20 MED ORDER — WARFARIN SODIUM 6 MG PO TABS
6.0000 mg | ORAL_TABLET | ORAL | Status: DC
  Filled 2017-07-20: qty 1

## 2017-07-20 MED ORDER — INSULIN ASPART 100 UNIT/ML ~~LOC~~ SOLN
0.0000 [IU] | Freq: Three times a day (TID) | SUBCUTANEOUS | Status: DC
  Administered 2017-07-20: 3 [IU] via SUBCUTANEOUS
  Administered 2017-07-20: 2 [IU] via SUBCUTANEOUS
  Filled 2017-07-20 (×2): qty 1

## 2017-07-20 MED ORDER — WARFARIN SODIUM 5 MG PO TABS: 5.0000 mg | ORAL_TABLET | ORAL | Status: DC

## 2017-07-20 MED ORDER — ONDANSETRON HCL 4 MG/2ML IJ SOLN: 4.0000 mg | Freq: Four times a day (QID) | INTRAMUSCULAR | Status: DC | PRN

## 2017-07-20 MED ORDER — FERROUS SULFATE 325 (65 FE) MG PO TABS
325.0000 mg | ORAL_TABLET | Freq: Every day | ORAL | Status: DC
  Administered 2017-07-20: 325 mg via ORAL
  Filled 2017-07-20: qty 1

## 2017-07-20 MED ORDER — FLUTICASONE PROPIONATE 50 MCG/ACT NA SUSP
1.0000 | Freq: Every day | NASAL | Status: DC
  Administered 2017-07-20: 1 via NASAL
  Filled 2017-07-20: qty 16

## 2017-07-20 MED ORDER — HYDROCODONE-ACETAMINOPHEN 5-325 MG PO TABS: 1.0000 | ORAL_TABLET | Freq: Four times a day (QID) | ORAL | 0 refills | Status: DC | PRN

## 2017-07-20 MED ORDER — DILTIAZEM HCL ER COATED BEADS 180 MG PO CP24
180.0000 mg | ORAL_CAPSULE | Freq: Every day | ORAL | Status: DC
  Administered 2017-07-20: 180 mg via ORAL
  Filled 2017-07-20: qty 1

## 2017-07-20 MED ORDER — POTASSIUM CHLORIDE ER 10 MEQ PO TBCR: 20.0000 meq | EXTENDED_RELEASE_TABLET | Freq: Every day | ORAL | Status: DC

## 2017-07-20 MED ORDER — METOPROLOL TARTRATE 25 MG PO TABS: 12.5000 mg | ORAL_TABLET | Freq: Two times a day (BID) | ORAL | Status: DC

## 2017-07-20 MED ORDER — SPIRONOLACTONE 25 MG PO TABS
50.0000 mg | ORAL_TABLET | Freq: Every day | ORAL | Status: DC
  Administered 2017-07-20: 50 mg via ORAL
  Filled 2017-07-20: qty 2

## 2017-07-20 MED ORDER — VENLAFAXINE HCL ER 75 MG PO CP24: 225.0000 mg | ORAL_CAPSULE | Freq: Every day | ORAL | Status: DC

## 2017-07-20 MED ORDER — ONDANSETRON HCL 4 MG PO TABS: 4.0000 mg | ORAL_TABLET | Freq: Four times a day (QID) | ORAL | Status: DC | PRN

## 2017-07-20 MED ORDER — DILTIAZEM HCL ER COATED BEADS 180 MG PO TB24: 180.0000 mg | ORAL_TABLET | Freq: Every day | ORAL | Status: DC

## 2017-07-20 MED ORDER — FERROUS SULFATE 324 (65 FE) MG PO TBEC: 324.0000 mg | DELAYED_RELEASE_TABLET | Freq: Every day | ORAL | Status: DC

## 2017-07-20 MED ORDER — ROPINIROLE HCL 1 MG PO TABS: 4.0000 mg | ORAL_TABLET | Freq: Every day | ORAL | Status: DC

## 2017-07-20 MED ORDER — METOPROLOL SUCCINATE ER 25 MG PO TB24
25.0000 mg | ORAL_TABLET | Freq: Every day | ORAL | Status: DC
  Administered 2017-07-20: 25 mg via ORAL
  Filled 2017-07-20: qty 1

## 2017-07-20 MED ORDER — VENLAFAXINE HCL 75 MG PO TABS
75.0000 mg | ORAL_TABLET | Freq: Three times a day (TID) | ORAL | Status: DC
  Administered 2017-07-20 (×2): 75 mg via ORAL
  Filled 2017-07-20 (×3): qty 1

## 2017-07-20 MED ORDER — ASPIRIN EC 81 MG PO TBEC
81.0000 mg | DELAYED_RELEASE_TABLET | Freq: Every day | ORAL | Status: DC
  Administered 2017-07-20: 81 mg via ORAL
  Filled 2017-07-20: qty 1

## 2017-07-20 MED ORDER — ACETAMINOPHEN 325 MG PO TABS
650.0000 mg | ORAL_TABLET | Freq: Once | ORAL | Status: AC
  Administered 2017-07-20: 650 mg via ORAL
  Filled 2017-07-20: qty 2

## 2017-07-20 MED ORDER — DOCUSATE SODIUM 100 MG PO CAPS
100.0000 mg | ORAL_CAPSULE | Freq: Two times a day (BID) | ORAL | Status: DC
  Administered 2017-07-20: 100 mg via ORAL
  Filled 2017-07-20: qty 1

## 2017-07-20 MED ORDER — OXYBUTYNIN CHLORIDE 5 MG PO TABS
5.0000 mg | ORAL_TABLET | Freq: Two times a day (BID) | ORAL | Status: DC
  Administered 2017-07-20: 5 mg via ORAL
  Filled 2017-07-20: qty 1

## 2017-07-20 MED ORDER — PROPAFENONE HCL 225 MG PO TABS
225.0000 mg | ORAL_TABLET | Freq: Two times a day (BID) | ORAL | Status: DC
  Administered 2017-07-20: 225 mg via ORAL
  Filled 2017-07-20 (×2): qty 1

## 2017-07-20 MED ORDER — ORAL CARE MOUTH RINSE: 15.0000 mL | Freq: Two times a day (BID) | OROMUCOSAL | Status: DC

## 2017-07-20 MED ORDER — ALBUTEROL SULFATE HFA 108 (90 BASE) MCG/ACT IN AERS: 2.0000 | INHALATION_SPRAY | RESPIRATORY_TRACT | Status: DC | PRN

## 2017-07-20 MED ORDER — AZATHIOPRINE 50 MG PO TABS
100.0000 mg | ORAL_TABLET | Freq: Every day | ORAL | Status: DC
  Administered 2017-07-20: 100 mg via ORAL
  Filled 2017-07-20: qty 2

## 2017-07-20 MED ORDER — ATORVASTATIN CALCIUM 20 MG PO TABS
20.0000 mg | ORAL_TABLET | Freq: Every day | ORAL | Status: DC
  Administered 2017-07-20: 20 mg via ORAL
  Filled 2017-07-20: qty 1

## 2017-07-20 MED ORDER — CALCIUM CARBONATE-VITAMIN D 500-200 MG-UNIT PO TABS
1.0000 | ORAL_TABLET | Freq: Every day | ORAL | Status: DC
  Administered 2017-07-20: 10:00:00 1 via ORAL
  Filled 2017-07-20: qty 1

## 2017-07-20 MED ORDER — HYDROCODONE-ACETAMINOPHEN 5-325 MG PO TABS
1.0000 | ORAL_TABLET | Freq: Four times a day (QID) | ORAL | Status: DC | PRN
  Administered 2017-07-20: 1 via ORAL
  Filled 2017-07-20: qty 1

## 2017-07-20 MED ORDER — LORATADINE 10 MG PO TABS
10.0000 mg | ORAL_TABLET | Freq: Every day | ORAL | Status: DC
  Administered 2017-07-20: 10 mg via ORAL
  Filled 2017-07-20: qty 1

## 2017-07-20 MED ORDER — SODIUM CHLORIDE 0.9 % IV SOLN
INTRAVENOUS | Status: DC
  Administered 2017-07-20: 05:00:00 via INTRAVENOUS

## 2017-07-20 MED ORDER — ALBUTEROL SULFATE (2.5 MG/3ML) 0.083% IN NEBU: 2.5000 mg | INHALATION_SOLUTION | RESPIRATORY_TRACT | Status: DC | PRN

## 2017-07-20 MED ORDER — ACETAMINOPHEN 325 MG PO TABS
650.0000 mg | ORAL_TABLET | Freq: Four times a day (QID) | ORAL | Status: DC | PRN
  Administered 2017-07-20: 650 mg via ORAL
  Filled 2017-07-20: qty 2

## 2017-07-20 MED ORDER — METOLAZONE 2.5 MG PO TABS: 2.5000 mg | ORAL_TABLET | ORAL | Status: DC

## 2017-07-20 MED ORDER — ACETAMINOPHEN 650 MG RE SUPP: 650.0000 mg | Freq: Four times a day (QID) | RECTAL | Status: DC | PRN

## 2017-07-20 MED ORDER — PANTOPRAZOLE SODIUM 40 MG PO TBEC
40.0000 mg | DELAYED_RELEASE_TABLET | Freq: Every day | ORAL | Status: DC
  Administered 2017-07-20: 40 mg via ORAL
  Filled 2017-07-20: qty 1

## 2017-07-20 MED ORDER — GABAPENTIN 600 MG PO TABS
600.0000 mg | ORAL_TABLET | Freq: Two times a day (BID) | ORAL | Status: DC
  Administered 2017-07-20: 600 mg via ORAL
  Filled 2017-07-20: qty 1

## 2017-07-20 MED ORDER — ROPINIROLE HCL 1 MG PO TABS: 2.0000 mg | ORAL_TABLET | Freq: Every evening | ORAL | Status: DC | PRN

## 2017-07-20 MED ORDER — TRAZODONE HCL 100 MG PO TABS: 200.0000 mg | ORAL_TABLET | Freq: Every day | ORAL | Status: DC

## 2017-07-20 MED ORDER — POTASSIUM CHLORIDE CRYS ER 20 MEQ PO TBCR
20.0000 meq | EXTENDED_RELEASE_TABLET | Freq: Every day | ORAL | Status: DC
  Administered 2017-07-20: 20 meq via ORAL
  Filled 2017-07-20: qty 1

## 2017-07-20 MED ORDER — MAGNESIUM OXIDE 400 (241.3 MG) MG PO TABS
400.0000 mg | ORAL_TABLET | Freq: Every day | ORAL | Status: DC
  Administered 2017-07-20: 400 mg via ORAL
  Filled 2017-07-20: qty 1

## 2017-07-20 MED ORDER — TORSEMIDE 20 MG PO TABS: 80.0000 mg | ORAL_TABLET | Freq: Two times a day (BID) | ORAL | Status: DC

## 2017-07-20 NOTE — H&P (Signed)
Betty [name] is an 71 y.o. female.   Chief Complaint: Fall HPI: The patient with complicated past medical history significant for atrial fibrillation, vasculitis, mitral valve replacement, COPD, OSA, diastolic heart failure, interstitial lung disease and hypertension presents to the emergency department via EMS after suffering a fall in a department store. The patient landed on her face. CT of the face revealed broken nose as well as multiple contusions and minor fractures. No acute intracranial process. The patient reports that she tripped although she also syncopized again in the emergency department when she was walked by the nursing staff. Based on contradictory witness reports the patient clearly does not remember the circumstances surrounding also of consciousness, although she does deny chest pain, palpitations, shortness of breath, nausea, vomiting or dizziness. The patient admits to headache at this time however. Due to recurrent syncope the emergency department staff called the hospitalist service for admission.  Past Medical History:  Diagnosis Date  . Acute diastolic heart failure ([place])   . Allergy   . 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 Asthma   . Atrial fibrillation ([place])   . Backache, unspecified   . Cardiomegaly   . COPD (chronic obstructive pulmonary disease) ([place])   . Diabetes mellitus without complication ([place])   . 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 Esophageal reflux   . Headache(784.0)   . Herpes zoster without mention of complication   . Hx: UTI (urinary tract infection)   . Hypertension    heart controlled w CHF  . Nontoxic uninodular goiter   . Obesity, unspecified   . Osteoarthrosis, unspecified whether generalized or localized, unspecified site   . Unspecified sleep apnea   . Urine incontinence    hx of    Past Surgical History:  Procedure Laterality Date  . ABDOMINAL HYSTERECTOMY  1979   complete (for precancerous cells)  .  ABLATION  2011 & 2014  . CHOLECYSTECTOMY    . OOPHORECTOMY      Family History  Problem Relation Age of Onset  . Heart attack Father   . Heart failure Father   . Arthritis Father   . Stroke Father   . Hypertension Father   . Coronary artery disease Brother   . Peripheral vascular disease Brother   . Arthritis Mother   . Colon cancer Mother        colon cancer  . Hypertension Mother   . Cancer Maternal Grandmother        colon cancer  . Arthritis Maternal Grandmother    Social History:  reports that she has quit smoking. Her smoking use included Cigarettes. She has a 7.50 pack-year smoking history. She has never used smokeless tobacco. She reports that she does not drink alcohol or use drugs.  Allergies:  Allergies  Allergen Reactions  . Flecainide Shortness Of Breath and Other (See Comments)    Reaction: dizziness   . Amiodarone Other (See Comments)    Pt states that this medication causes lung bleeding.      Medications Prior to Admission  Medication Sig Dispense Refill  . albuterol (PROVENTIL HFA;VENTOLIN HFA) 108 (90 Base) MCG/ACT inhaler Inhale 2 puffs into the lungs every 4 (four) hours as needed for wheezing or shortness of breath. 1 Inhaler 10  . albuterol (PROVENTIL) (2.5 MG/3ML) 0.083% nebulizer solution Take 3 mLs (2.5 mg total) by nebulization every 4 (four) hours as needed for wheezing or shortness of breath. 75 mL 12  . aspirin  EC 81 MG tablet Take 81 mg by mouth daily.    Marland Kitchen atorvastatin (LIPITOR) 20 MG tablet Take 20 mg by mouth daily.    Marland Kitchen azaTHIOprine (IMURAN) 50 MG tablet Take 100 mg by mouth daily.     . calcium-vitamin D (OSCAL WITH D) 500-200 MG-UNIT per tablet Take 1 tablet by mouth daily.    Marland Kitchen diltiazem (CARDIZEM CD) 180 MG 24 hr capsule Take 180 mg by mouth daily.  3  . ferrous sulfate 324 (65 Fe) MG TBEC Take 324 mg by mouth daily.  11  . fluticasone (FLONASE) 50 MCG/ACT nasal spray Place 2 sprays into the nose daily.     Marland Kitchen gabapentin (NEURONTIN)  600 MG tablet Take 600 mg by mouth 2 (two) times daily.    Marland Kitchen loratadine (CLARITIN) 10 MG tablet Take 10 mg by mouth daily.  11  . magnesium oxide (MAG-OX) 400 (241.3 Mg) MG tablet TAKE 1 TABLET (400 MG TOTAL) BY MOUTH ONCE DAILY.  3  . metFORMIN (GLUCOPHAGE) 1000 MG tablet Take 1,000 mg by mouth 2 (two) times daily with a meal.     . metolazone (ZAROXOLYN) 2.5 MG tablet Take 2.5 mg by mouth 2 (two) times a week. Take 2.5 mg by mouth on Monday and Thursday.    . metoprolol succinate (TOPROL-XL) 25 MG 24 hr tablet Take 25 mg by mouth daily.  3  . oxybutynin (DITROPAN) 5 MG tablet Take 5 mg by mouth 3 (three) times daily.     . pantoprazole (PROTONIX) 40 MG tablet Take 40 mg by mouth daily.    . potassium chloride SA (K-DUR,KLOR-CON) 20 MEQ tablet Take 20 mEq by mouth daily.  3  . propafenone (RYTHMOL) 225 MG tablet Take 225 mg by mouth every 12 (twelve) hours.    Marland Kitchen rOPINIRole (REQUIP) 4 MG tablet Take 4 mg by mouth at bedtime.  3  . spironolactone (ALDACTONE) 50 MG tablet Take 50 mg by mouth daily.    Marland Kitchen torsemide (DEMADEX) 20 MG tablet Take 80 mg by mouth 2 (two) times daily.     . traZODone (DESYREL) 100 MG tablet Take 200 mg by mouth at bedtime.    Marland Kitchen venlafaxine (EFFEXOR) 75 MG tablet Take 75 mg by mouth 3 (three) times daily.  3  . warfarin (COUMADIN) 5 MG tablet Take 5 mg by mouth See admin instructions. Take 5 mg everyday except Saturday and Sunday    . warfarin (COUMADIN) 6 MG tablet Take 6 mg by mouth See admin instructions. Patient takes 6 mg on Saturday and Sunday    . Lancets 30G MISC Use 1 Units as directed. Check CBG's fasting once daily. Dx: E11.9      Results for orders placed or performed during the hospital encounter of 07/19/17 (from the past 48 hour(s))  CBC with Differential     Status: Abnormal   Collection Time: 07/19/17 11:05 PM  Result Value Ref Range   WBC 8.9 3.6 - 11.0 K/uL   RBC 4.01 3.80 - 5.20 MIL/uL   Hemoglobin 9.6 (L) 12.0 - 16.0 g/dL   HCT 30.4 (L) 35.0 -  47.0 %   MCV 75.7 (L) 80.0 - 100.0 fL   MCH 24.1 (L) 26.0 - 34.0 pg   MCHC 31.8 (L) 32.0 - 36.0 g/dL   RDW 24.2 (H) 11.5 - 14.5 %   Platelets 269 150 - 440 K/uL   Neutrophils Relative % 83 %   Neutro Abs 7.4 (H) 1.4 - 6.5 K/uL  Lymphocytes Relative 5 %   Lymphs Abs 0.4 (L) 1.0 - 3.6 K/uL   Monocytes Relative 10 %   Monocytes Absolute 0.9 0.2 - 0.9 K/uL   Eosinophils Relative 1 %   Eosinophils Absolute 0.1 0 - 0.7 K/uL   Basophils Relative 1 %   Basophils Absolute 0.1 0 - 0.1 K/uL  Comprehensive metabolic panel     Status: Abnormal   Collection Time: 07/19/17 11:05 PM  Result Value Ref Range   Sodium 132 (L) 135 - 145 mmol/L   Potassium 3.9 3.5 - 5.1 mmol/L   Chloride 91 (L) 101 - 111 mmol/L   CO2 30 22 - 32 mmol/L   Glucose, Bld 136 (H) 65 - 99 mg/dL   BUN 32 (H) 6 - 20 mg/dL   Creatinine, Ser 1.21 (H) 0.44 - 1.00 mg/dL   Calcium 8.2 (L) 8.9 - 10.3 mg/dL   Total Protein 6.3 (L) 6.5 - 8.1 g/dL   Albumin 3.6 3.5 - 5.0 g/dL   AST 89 (H) 15 - 41 U/L   ALT 49 14 - 54 U/L   Alkaline Phosphatase 75 38 - 126 U/L   Total Bilirubin 1.0 0.3 - 1.2 mg/dL   GFR calc non Af Amer 44 (L) >60 mL/min   GFR calc Af Amer 51 (L) >60 mL/min    Comment: (NOTE) The eGFR has been calculated using the CKD EPI equation. This calculation has not been validated in all clinical situations. eGFR's persistently <60 mL/min signify possible Chronic Kidney Disease.    Anion gap 11 5 - 15  Troponin I     Status: None   Collection Time: 07/19/17 11:05 PM  Result Value Ref Range   Troponin I <0.03 <0.03 ng/mL  Urinalysis, Complete w Microscopic     Status: Abnormal   Collection Time: 07/19/17 11:05 PM  Result Value Ref Range   Color, Urine YELLOW (A) YELLOW   APPearance CLOUDY (A) CLEAR   Specific Gravity, Urine 1.006 1.005 - 1.030   pH 6.0 5.0 - 8.0   Glucose, UA NEGATIVE NEGATIVE mg/dL   Hgb urine dipstick SMALL (A) NEGATIVE   Bilirubin Urine NEGATIVE NEGATIVE   Ketones, ur NEGATIVE NEGATIVE  mg/dL   Protein, ur NEGATIVE NEGATIVE mg/dL   Nitrite NEGATIVE NEGATIVE   Leukocytes, UA TRACE (A) NEGATIVE   RBC / HPF 0-5 0 - 5 RBC/hpf   WBC, UA 0-5 0 - 5 WBC/hpf   Bacteria, UA RARE (A) NONE SEEN   Squamous Epithelial / LPF 6-30 (A) NONE SEEN   Mucous PRESENT    Hyaline Casts, UA PRESENT   Protime-INR     Status: Abnormal   Collection Time: 07/19/17 11:05 PM  Result Value Ref Range   Prothrombin Time 24.1 (H) 11.4 - 15.2 seconds   INR 2.12   TSH     Status: None   Collection Time: 07/19/17 11:05 PM  Result Value Ref Range   TSH 3.249 0.350 - 4.500 uIU/mL    Comment: Performed by a 3rd Generation assay with a functional sensitivity of <=0.01 uIU/mL.  Ammonia     Status: None   Collection Time: 07/20/17  3:47 AM  Result Value Ref Range   Ammonia 30 9 - 35 umol/L   Dg Chest 2 View  Result Date: 07/19/2017 CLINICAL DATA:  Weakness and dizziness this evening. EXAM: CHEST  2 VIEW COMPARISON:  05/25/2017 FINDINGS: Postoperative changes in the mediastinum with cardiac valve prosthesis. Cardiac enlargement with mild pulmonary vascular congestion. Patchy  interstitial pattern throughout the lungs demonstrates progression since previous study and likely represents mild edema superimposed on chronic fibrosis. Atelectasis in the lung bases. No focal consolidation. Blunting of the right costophrenic angle suggests a small pleural effusion. No pneumothorax. Calcification of the aorta. Degenerative changes in the spine. IMPRESSION: Cardiac enlargement with mild pulmonary vascular congestion and mild interstitial edema superimposed upon chronic fibrosis. This demonstrates mild progression since previous study. No focal consolidation. Electronically Signed   By: Lucienne Capers M.D.   On: 07/19/2017 23:02   Dg Ankle Complete Left  Result Date: 07/19/2017 CLINICAL DATA:  Left hip and ankle pain status post fall. EXAM: LEFT ANKLE COMPLETE - 3+ VIEW COMPARISON:  None. FINDINGS: There is no evidence of  fracture, dislocation, or joint effusion. There is no evidence of arthropathy or other focal bone abnormality. Soft tissues are unremarkable. IMPRESSION: Negative. Electronically Signed   By: Fidela Salisbury M.D.   On: 07/19/2017 21:05   Ct Head Wo Contrast  Result Date: 07/19/2017 CLINICAL DATA:  Fall with left facial bruising. EXAM: CT HEAD WITHOUT CONTRAST CT MAXILLOFACIAL WITHOUT CONTRAST TECHNIQUE: Multidetector CT imaging of the head and maxillofacial structures were performed using the standard protocol without intravenous contrast. Multiplanar CT image reconstructions of the maxillofacial structures were also generated. COMPARISON:  Head CT 05/25/2017 FINDINGS: CT HEAD FINDINGS Brain: No mass lesion, intraparenchymal hemorrhage or extra-axial collection. No evidence of acute cortical infarct. Bilateral caudate lacunar infarcts, chronic. There is periventricular hypoattenuation compatible with chronic microvascular disease. Vascular: No hyperdense vessel or unexpected calcification. CT MAXILLOFACIAL FINDINGS Osseous: --Complex facial fracture types: No LeFort, zygomaticomaxillary complex or nasoorbitoethmoidal fracture. --Simple fracture types: Comminuted, minimally displaced nasal bone fracture. --Mandible, hard palate and teeth: No acute abnormality. Orbits: The globes appear intact. Normal appearance of the intra- and extraconal fat. Symmetric extraocular muscles. Sinuses: Hyperdense material in the right nasal cavity is likely blood. Soft tissues:   Small frontal and left facial hematomas. IMPRESSION: 1. 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 2. Minimally displaced fractures of the nasal bone with midline frontal and left facial hematomas. Electronically Signed   By: Ulyses Jarred M.D.   On: 07/19/2017 20:00   Dg Hip Unilat W Or Wo Pelvis 2-3 Views Left  Result Date: 07/19/2017 CLINICAL DATA:  Left hip pain after fall. EXAM: DG HIP (WITH  OR WITHOUT PELVIS) 2-3V LEFT COMPARISON:  None. FINDINGS: There is no evidence of hip fracture or dislocation. There is no evidence of arthropathy or other focal bone abnormality. IMPRESSION: Normal left hip. Electronically Signed   By: Marijo Conception, M.D.   On: 07/19/2017 21:07   Ct Maxillofacial Wo Contrast  Result Date: 07/19/2017 CLINICAL DATA:  Fall with left facial bruising. EXAM: CT HEAD WITHOUT CONTRAST CT MAXILLOFACIAL WITHOUT CONTRAST TECHNIQUE: Multidetector CT imaging of the head and maxillofacial structures were performed using the standard protocol without intravenous contrast. Multiplanar CT image reconstructions of the maxillofacial structures were also generated. COMPARISON:  Head CT 05/25/2017 FINDINGS: CT HEAD FINDINGS Brain: No mass lesion, intraparenchymal hemorrhage or extra-axial collection. No evidence of acute cortical infarct. Bilateral caudate lacunar infarcts, chronic. There is periventricular hypoattenuation compatible with chronic microvascular disease. Vascular: No hyperdense vessel or unexpected calcification. CT MAXILLOFACIAL FINDINGS Osseous: --Complex facial fracture types: No LeFort, zygomaticomaxillary complex or nasoorbitoethmoidal fracture. --Simple fracture types: Comminuted, minimally displaced nasal bone fracture. --Mandible, hard palate and teeth: No acute abnormality. Orbits: The globes appear intact. Normal appearance of the intra- and extraconal fat.  Symmetric extraocular muscles. Sinuses: Hyperdense material in the right nasal cavity is likely blood. Soft tissues:   Small frontal and left facial hematomas. IMPRESSION: 1. 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 2. Minimally displaced fractures of the nasal bone with midline frontal and left facial hematomas. Electronically Signed   By: Ulyses Jarred M.D.   On: 07/19/2017 20:00    Review of Systems  Constitutional: Negative for chills and fever.  HENT:  Negative for sore throat and tinnitus.   Eyes: Negative for blurred vision and redness.  Respiratory: Negative for cough and shortness of breath.   Cardiovascular: Negative for chest pain, palpitations, orthopnea and PND.  Gastrointestinal: Negative for abdominal pain, diarrhea, nausea and vomiting.  Genitourinary: Negative for dysuria, frequency and urgency.  Musculoskeletal: Negative for joint pain and myalgias.  Skin: Negative for rash.       No lesions  Neurological: Positive for loss of consciousness and headaches. Negative for speech change, focal weakness and weakness.  Endo/Heme/Allergies: Does not bruise/bleed easily.       No temperature intolerance  Psychiatric/Behavioral: Negative for depression and suicidal ideas.    Blood pressure 119/66, pulse 98, temperature 98.2 F (36.8 C), temperature source Oral, resp. rate 18, height _0  (1.753 m), weight 110.9 kg (244 lb 9.6 oz), SpO2 95 %. Physical Exam  Vitals reviewed. Constitutional: She is oriented to person, place, and time. She appears well-developed and well-nourished. No distress.  HENT:  Head: Normocephalic and atraumatic.  Mouth/Throat: Oropharynx is clear and moist.  Eyes: Pupils are equal, round, and reactive to light. Conjunctivae and EOM are normal. No scleral icterus.  Neck: Normal range of motion. Neck supple. No JVD present. No tracheal deviation present. No thyromegaly present.  Cardiovascular: Normal rate, regular rhythm and normal heart sounds.  Exam reveals no gallop and no friction rub.   No murmur heard. Respiratory: Effort normal and breath sounds normal.  GI: Soft. Bowel sounds are normal. She exhibits no distension. There is no tenderness.  Genitourinary:  Genitourinary Comments: Deferred  Musculoskeletal: Normal range of motion. She exhibits no edema.  Lymphadenopathy:    She has no cervical adenopathy.  Neurological: She is alert and oriented to person, place, and time. No cranial nerve deficit.  She exhibits normal muscle tone.  Skin: Skin is warm and dry. No rash noted. No erythema.  Psychiatric: She has a normal mood and affect. Her behavior is normal. Judgment and thought content normal.     Assessment/Plan This is a 72 year old female admitted for syncope. 1. Syncope: Recurrent; etiology multifactorial. The patient states that she usually is able to feel when her rhythm is atrial fibrillation although she does not endorse symptoms of arrhythmia with this episode. No arrhythmia documented on telemetry in the emergency department. I have ordered an echocardiogram. The patient's eye movement also seems gyric at times. In light of her mild jaundice and ascites I have ordered an ammonia level. However, she does not seem to have encephalopathy. Continue to monitor. Cardiology consultation following echocardiogram. 2. CKD: Acute on chronic; hydrate with intravenous fluid. Avoid nephrotoxic agents. 3. Hyponatremia: Hypovolemic state; secondary to ascites. Hydrate with normal saline. Continue diuretics per home regimen. 4. CHF: Chronic; diastolic. Stable 5. Essential hypertension: Controlled; continue metoprolol 6. Atrial fibrillation: Paroxysmal. Patient is currently in sinus rhythm. Continue propafenone and diltiazem. Warfarin for anticoagulation 7. OSA: Broken nose contraindication to CPAP 8. Vasculitis: ANCA (Wegener's?); Continue azathioprine for kidney involvement 9. COPD: Albuterol as needed 10.  Hyperlipidemia: Continue statin therapy 11. Glucose intolerance: Continue sliding scale insulin while hospitalized 12. Restless leg syndrome: Continue Requip 13. Depression: Continue Effexor and trazodone 14. DVT prophylaxis: Full dose anticoagulation 15. GI prophylaxis: Pantoprazole per home regimen The patient is a full code. Time spent on admission orders and patient care approximately 45 minutes   Harrie Foreman, MD 07/20/2017, 7:43 AM

## 2017-07-20 NOTE — ED Notes (Signed)
Pt's family left phone numbers to call them if anyone needs to speak to them. Pt's son is Merry Proud and his number is [phone]; and pt's sister is Judeen Hammans and her number is [phone].

## 2017-07-20 NOTE — Progress Notes (Signed)
Discharge instructions explained to pt. Verbalized an understanding/ iv and tele removed/ RX given to pt. / pt instructed to keep her f/u apt with Dr. Marcello Moores on July 31/ verbalized she would/ transferred off unit via wheelchair.

## 2017-07-20 NOTE — Discharge Instructions (Signed)
Resume diet and activity as before ° ° °

## 2017-07-20 NOTE — Progress Notes (Signed)
Patient arrived to 2A Room 243. Patient denies pain and all questions answered. Patient oriented to unit and Fall Safety Plan signed. Skin assessment completed with Andria Meuse RN; brusing noted to left side and abrasions to face and left knee. A&Ox4, VSS, and NSR on verified tele-box #[id number]. Nursing staff will continue to monitor for any changes in patient status. Earleen Reaper, RN

## 2017-07-20 NOTE — Plan of Care (Signed)
Problem: Physical Regulation: Goal: Ability to maintain clinical measurements within normal limits will improve Outcome: Not Progressing Patient's hemoglobin today = only 9.6. Will continue to monitor. Wenda Low [facility]

## 2017-07-20 NOTE — Consult Note (Signed)
Betty [name] is a 71 y.o. female  [id number]  Primary Cardiologist: Neoma Laming Reason for Consultation: Syncope  HPI: This is a 71 year old white female with a past medical history of atrial fibrillation with multiple ablation at [facility] presented to the hospital with bruises on her face after falling down. Patient states that she felt dizzy and tripped and fell down. She did not lose consciousness. CT of the head was done and was negative.   Review of Systems: No chest pain dizziness or shortness of breath   Past Medical History:  Diagnosis Date  . Acute diastolic heart failure ([place])   . Allergy   . 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 Asthma   . Atrial fibrillation ([place])   . Backache, unspecified   . Cardiomegaly   . COPD (chronic obstructive pulmonary disease) ([place])   . Diabetes mellitus without complication ([place])   . 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 Esophageal reflux   . Headache(784.0)   . Herpes zoster without mention of complication   . Hx: UTI (urinary tract infection)   . Hypertension    heart controlled w CHF  . Nontoxic uninodular goiter   . Obesity, unspecified   . Osteoarthrosis, unspecified whether generalized or localized, unspecified site   . Unspecified sleep apnea   . Urine incontinence    hx of    Medications Prior to Admission  Medication Sig Dispense Refill  . albuterol (PROVENTIL HFA;VENTOLIN HFA) 108 (90 Base) MCG/ACT inhaler Inhale 2 puffs into the lungs every 4 (four) hours as needed for wheezing or shortness of breath. 1 Inhaler 10  . albuterol (PROVENTIL) (2.5 MG/3ML) 0.083% nebulizer solution Take 3 mLs (2.5 mg total) by nebulization every 4 (four) hours as needed for wheezing or shortness of breath. 75 mL 12  . aspirin EC 81 MG tablet Take 81 mg by mouth daily.    Marland Kitchen atorvastatin (LIPITOR) 20 MG tablet Take 20 mg by mouth daily.    Marland Kitchen azaTHIOprine (IMURAN) 50 MG tablet Take 100 mg by mouth daily.      . calcium-vitamin D (OSCAL WITH D) 500-200 MG-UNIT per tablet Take 1 tablet by mouth daily.    Marland Kitchen diltiazem (CARDIZEM CD) 180 MG 24 hr capsule Take 180 mg by mouth daily.  3  . ferrous sulfate 324 (65 Fe) MG TBEC Take 324 mg by mouth daily.  11  . fluticasone (FLONASE) 50 MCG/ACT nasal spray Place 2 sprays into the nose daily.     Marland Kitchen gabapentin (NEURONTIN) 600 MG tablet Take 600 mg by mouth 2 (two) times daily.    Marland Kitchen loratadine (CLARITIN) 10 MG tablet Take 10 mg by mouth daily.  11  . magnesium oxide (MAG-OX) 400 (241.3 Mg) MG tablet TAKE 1 TABLET (400 MG TOTAL) BY MOUTH ONCE DAILY.  3  . metFORMIN (GLUCOPHAGE) 1000 MG tablet Take 1,000 mg by mouth 2 (two) times daily with a meal.     . metolazone (ZAROXOLYN) 2.5 MG tablet Take 2.5 mg by mouth 2 (two) times a week. Take 2.5 mg by mouth on Monday and Thursday.    . metoprolol succinate (TOPROL-XL) 25 MG 24 hr tablet Take 25 mg by mouth daily.  3  . oxybutynin (DITROPAN) 5 MG tablet Take 5 mg by mouth 3 (three) times daily.     . pantoprazole (PROTONIX) 40 MG tablet Take 40 mg by mouth daily.    . potassium chloride SA (K-DUR,KLOR-CON) 20  MEQ tablet Take 20 mEq by mouth daily.  3  . propafenone (RYTHMOL) 225 MG tablet Take 225 mg by mouth every 12 (twelve) hours.    Marland Kitchen rOPINIRole (REQUIP) 4 MG tablet Take 4 mg by mouth at bedtime.  3  . spironolactone (ALDACTONE) 50 MG tablet Take 50 mg by mouth daily.    Marland Kitchen torsemide (DEMADEX) 20 MG tablet Take 80 mg by mouth 2 (two) times daily.     . traZODone (DESYREL) 100 MG tablet Take 200 mg by mouth at bedtime.    Marland Kitchen venlafaxine (EFFEXOR) 75 MG tablet Take 75 mg by mouth 3 (three) times daily.  3  . warfarin (COUMADIN) 5 MG tablet Take 5 mg by mouth See admin instructions. Take 5 mg everyday except Saturday and Sunday    . warfarin (COUMADIN) 6 MG tablet Take 6 mg by mouth See admin instructions. Patient takes 6 mg on Saturday and Sunday    . Lancets 30G MISC Use 1 Units as directed. Check CBG's fasting once  daily. Dx: E11.9       . aspirin EC  81 mg Oral Daily  . atorvastatin  20 mg Oral Daily  . azaTHIOprine  100 mg Oral Daily  . calcium-vitamin D  1 tablet Oral Daily  . diltiazem  180 mg Oral Daily  . docusate sodium  100 mg Oral BID  . ferrous sulfate  325 mg Oral Q breakfast  . fluticasone  1 spray Each Nare Daily  . gabapentin  600 mg Oral BID  . insulin aspart  0-5 Units Subcutaneous QHS  . insulin aspart  0-9 Units Subcutaneous TID WC  . loratadine  10 mg Oral Daily  . magnesium oxide  400 mg Oral Daily  . mouth rinse  15 mL Mouth Rinse BID  . [START ON 07/21/2017] metolazone  2.5 mg Oral Once per day on Mon Thu  . metoprolol succinate  25 mg Oral Daily  . oxybutynin  5 mg Oral BID  . pantoprazole  40 mg Oral Daily  . potassium chloride SA  20 mEq Oral Daily  . propafenone  225 mg Oral Q12H  . rOPINIRole  4 mg Oral QHS  . spironolactone  50 mg Oral Daily  . traZODone  200 mg Oral QHS  . venlafaxine  75 mg Oral TID  . [START ON 07/21/2017] warfarin  5 mg Oral Once per day on Mon Tue Wed Thu Fri  . warfarin  6 mg Oral Once per day on Sun Sat    Infusions:   Allergies  Allergen Reactions  . Flecainide Shortness Of Breath and Other (See Comments)    Reaction: dizziness   . Amiodarone Other (See Comments)    Pt states that this medication causes lung bleeding.      Social History   Social History  . Marital status: Widowed    Spouse name: N/A  . Number of children: 1  . Years of education: N/A   Occupational History  .  [employer] Main Topics  . Smoking status: Former Smoker    Packs/day: 0.50    Years: 15.00    Types: Cigarettes  . Smokeless tobacco: Never Used     Comment: Has a 20-pack-year history, qutting in 1970.   Marland Kitchen Alcohol use No  . Drug use: No  . Sexual activity: Not Currently   Other Topics Concern  . Not on file   Social History Narrative   Lives  in [place] with her husband. 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 Does not routinely exercise.     Family History  Problem Relation Age of Onset  . Heart attack Father   . Heart failure Father   . Arthritis Father   . Stroke Father   . Hypertension Father   . Coronary artery disease Brother   . Peripheral vascular disease Brother   . Arthritis Mother   . Colon cancer Mother        colon cancer  . Hypertension Mother   . Cancer Maternal Grandmother        colon cancer  . Arthritis Maternal Grandmother     PHYSICAL EXAM: Vitals:   07/20/17 0943 07/20/17 1148  BP: 104/87 100/67  Pulse: 80 73  Resp:  18  Temp:  98.7 F (37.1 C)     Intake/Output Summary (Last 24 hours) at 07/20/17 1249 Last data filed at 07/20/17 1239  Gross per 24 hour  Intake             1229 ml  Output             2550 ml  Net            -1321 ml    General:  Well appearing. No respiratory difficulty HEENT: normal Neck: supple. no JVD. Carotids 2+ bilat; no bruits. No lymphadenopathy or thryomegaly appreciated. Cor: PMI nondisplaced. Regular rate & rhythm. No rubs, gallops or murmurs. Lungs: clear Abdomen: soft, nontender, nondistended. No hepatosplenomegaly. No bruits or masses. Good bowel sounds. Extremities: no cyanosis, clubbing, rash, edema Neuro: alert & oriented x 3, cranial nerves grossly intact. moves all 4 extremities w/o difficulty. Affect pleasant.  RUE:AVWUJ rhythm with nonspecific ST-T changes  Results for orders placed or performed during the hospital encounter of 07/19/17 (from the past 24 hour(s))  CBC with Differential     Status: Abnormal   Collection Time: 07/19/17 11:05 PM  Result Value Ref Range   WBC 8.9 3.6 - 11.0 K/uL   RBC 4.01 3.80 - 5.20 MIL/uL   Hemoglobin 9.6 (L) 12.0 - 16.0 g/dL   HCT 30.4 (L) 35.0 - 47.0 %   MCV 75.7 (L) 80.0 - 100.0 fL   MCH 24.1 (L) 26.0 - 34.0 pg   MCHC 31.8 (L) 32.0 - 36.0 g/dL   RDW 24.2 (H) 11.5 - 14.5 %   Platelets 269 150 - 440 K/uL   Neutrophils  Relative % 83 %   Neutro Abs 7.4 (H) 1.4 - 6.5 K/uL   Lymphocytes Relative 5 %   Lymphs Abs 0.4 (L) 1.0 - 3.6 K/uL   Monocytes Relative 10 %   Monocytes Absolute 0.9 0.2 - 0.9 K/uL   Eosinophils Relative 1 %   Eosinophils Absolute 0.1 0 - 0.7 K/uL   Basophils Relative 1 %   Basophils Absolute 0.1 0 - 0.1 K/uL  Comprehensive metabolic panel     Status: Abnormal   Collection Time: 07/19/17 11:05 PM  Result Value Ref Range   Sodium 132 (L) 135 - 145 mmol/L   Potassium 3.9 3.5 - 5.1 mmol/L   Chloride 91 (L) 101 - 111 mmol/L   CO2 30 22 - 32 mmol/L   Glucose, Bld 136 (H) 65 - 99 mg/dL   BUN 32 (H) 6 - 20 mg/dL   Creatinine, Ser 1.21 (H) 0.44 - 1.00 mg/dL   Calcium 8.2 (L) 8.9 - 10.3 mg/dL   Total  Protein 6.3 (L) 6.5 - 8.1 g/dL   Albumin 3.6 3.5 - 5.0 g/dL   AST 89 (H) 15 - 41 U/L   ALT 49 14 - 54 U/L   Alkaline Phosphatase 75 38 - 126 U/L   Total Bilirubin 1.0 0.3 - 1.2 mg/dL   GFR calc non Af Amer 44 (L) >60 mL/min   GFR calc Af Amer 51 (L) >60 mL/min   Anion gap 11 5 - 15  Troponin I     Status: None   Collection Time: 07/19/17 11:05 PM  Result Value Ref Range   Troponin I <0.03 <0.03 ng/mL  Urinalysis, Complete w Microscopic     Status: Abnormal   Collection Time: 07/19/17 11:05 PM  Result Value Ref Range   Color, Urine YELLOW (A) YELLOW   APPearance CLOUDY (A) CLEAR   Specific Gravity, Urine 1.006 1.005 - 1.030   pH 6.0 5.0 - 8.0   Glucose, UA NEGATIVE NEGATIVE mg/dL   Hgb urine dipstick SMALL (A) NEGATIVE   Bilirubin Urine NEGATIVE NEGATIVE   Ketones, ur NEGATIVE NEGATIVE mg/dL   Protein, ur NEGATIVE NEGATIVE mg/dL   Nitrite NEGATIVE NEGATIVE   Leukocytes, UA TRACE (A) NEGATIVE   RBC / HPF 0-5 0 - 5 RBC/hpf   WBC, UA 0-5 0 - 5 WBC/hpf   Bacteria, UA RARE (A) NONE SEEN   Squamous Epithelial / LPF 6-30 (A) NONE SEEN   Mucous PRESENT    Hyaline Casts, UA PRESENT   Protime-INR     Status: Abnormal   Collection Time: 07/19/17 11:05 PM  Result Value Ref Range    Prothrombin Time 24.1 (H) 11.4 - 15.2 seconds   INR 2.12   TSH     Status: None   Collection Time: 07/19/17 11:05 PM  Result Value Ref Range   TSH 3.249 0.350 - 4.500 uIU/mL  Ammonia     Status: None   Collection Time: 07/20/17  3:47 AM  Result Value Ref Range   Ammonia 30 9 - 35 umol/L  Glucose, capillary     Status: Abnormal   Collection Time: 07/20/17  8:13 AM  Result Value Ref Range   Glucose-Capillary 227 (H) 65 - 99 mg/dL  Glucose, capillary     Status: Abnormal   Collection Time: 07/20/17 11:37 AM  Result Value Ref Range   Glucose-Capillary 183 (H) 65 - 99 mg/dL   Dg Chest 2 View  Result Date: 07/19/2017 CLINICAL DATA:  Weakness and dizziness this evening. EXAM: CHEST  2 VIEW COMPARISON:  05/25/2017 FINDINGS: Postoperative changes in the mediastinum with cardiac valve prosthesis. Cardiac enlargement with mild pulmonary vascular congestion. Patchy interstitial pattern throughout the lungs demonstrates progression since previous study and likely represents mild edema superimposed on chronic fibrosis. Atelectasis in the lung bases. No focal consolidation. Blunting of the right costophrenic angle suggests a small pleural effusion. No pneumothorax. Calcification of the aorta. Degenerative changes in the spine. IMPRESSION: Cardiac enlargement with mild pulmonary vascular congestion and mild interstitial edema superimposed upon chronic fibrosis. This demonstrates mild progression since previous study. No focal consolidation. Electronically Signed   By: Lucienne Capers M.D.   On: 07/19/2017 23:02   Dg Ankle Complete Left  Result Date: 07/19/2017 CLINICAL DATA:  Left hip and ankle pain status post fall. EXAM: LEFT ANKLE COMPLETE - 3+ VIEW COMPARISON:  None. FINDINGS: There is no evidence of fracture, dislocation, or joint effusion. There is no evidence of arthropathy or other focal bone abnormality. Soft tissues are unremarkable. IMPRESSION:  Negative. Electronically Signed   By: Fidela Salisbury M.D.   On: 07/19/2017 21:05   Ct Head Wo Contrast  Result Date: 07/19/2017 CLINICAL DATA:  Fall with left facial bruising. EXAM: CT HEAD WITHOUT CONTRAST CT MAXILLOFACIAL WITHOUT CONTRAST TECHNIQUE: Multidetector CT imaging of the head and maxillofacial structures were performed using the standard protocol without intravenous contrast. Multiplanar CT image reconstructions of the maxillofacial structures were also generated. COMPARISON:  Head CT 05/25/2017 FINDINGS: CT HEAD FINDINGS Brain: No mass lesion, intraparenchymal hemorrhage or extra-axial collection. No evidence of acute cortical infarct. Bilateral caudate lacunar infarcts, chronic. There is periventricular hypoattenuation compatible with chronic microvascular disease. Vascular: No hyperdense vessel or unexpected calcification. CT MAXILLOFACIAL FINDINGS Osseous: --Complex facial fracture types: No LeFort, zygomaticomaxillary complex or nasoorbitoethmoidal fracture. --Simple fracture types: Comminuted, minimally displaced nasal bone fracture. --Mandible, hard palate and teeth: No acute abnormality. Orbits: The globes appear intact. Normal appearance of the intra- and extraconal fat. Symmetric extraocular muscles. Sinuses: Hyperdense material in the right nasal cavity is likely blood. Soft tissues:   Small frontal and left facial hematomas. IMPRESSION: 1. 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 2. Minimally displaced fractures of the nasal bone with midline frontal and left facial hematomas. Electronically Signed   By: Ulyses Jarred M.D.   On: 07/19/2017 20:00   Dg Hip Unilat W Or Wo Pelvis 2-3 Views Left  Result Date: 07/19/2017 CLINICAL DATA:  Left hip pain after fall. EXAM: DG HIP (WITH OR WITHOUT PELVIS) 2-3V LEFT COMPARISON:  None. FINDINGS: There is no evidence of hip fracture or dislocation. There is no evidence of arthropathy or other focal bone abnormality. IMPRESSION: Normal  left hip. Electronically Signed   By: Marijo Conception, M.D.   On: 07/19/2017 21:07   Ct Maxillofacial Wo Contrast  Result Date: 07/19/2017 CLINICAL DATA:  Fall with left facial bruising. EXAM: CT HEAD WITHOUT CONTRAST CT MAXILLOFACIAL WITHOUT CONTRAST TECHNIQUE: Multidetector CT imaging of the head and maxillofacial structures were performed using the standard protocol without intravenous contrast. Multiplanar CT image reconstructions of the maxillofacial structures were also generated. COMPARISON:  Head CT 05/25/2017 FINDINGS: CT HEAD FINDINGS Brain: No mass lesion, intraparenchymal hemorrhage or extra-axial collection. No evidence of acute cortical infarct. Bilateral caudate lacunar infarcts, chronic. There is periventricular hypoattenuation compatible with chronic microvascular disease. Vascular: No hyperdense vessel or unexpected calcification. CT MAXILLOFACIAL FINDINGS Osseous: --Complex facial fracture types: No LeFort, zygomaticomaxillary complex or nasoorbitoethmoidal fracture. --Simple fracture types: Comminuted, minimally displaced nasal bone fracture. --Mandible, hard palate and teeth: No acute abnormality. Orbits: The globes appear intact. Normal appearance of the intra- and extraconal fat. Symmetric extraocular muscles. Sinuses: Hyperdense material in the right nasal cavity is likely blood. Soft tissues:   Small frontal and left facial hematomas. IMPRESSION: 1. 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 2. Minimally displaced fractures of the nasal bone with midline frontal and left facial hematomas. Electronically Signed   By: Ulyses Jarred M.D.   On: 07/19/2017 20:00     ASSESSMENT AND PLAN: Atrial fibrillation status post ablation currently in sinus rhythm. Patient had 2 second pause and states that she felt dizzy and tripped and fell and since she had therapeutic INR she developed bruises on her face but CT of the head was negative.  Patient has appointment on the 31st of this month to see Dr. Marcello Moores at [facility] electrophysiology lab. A shunt can be observed and if there is no further episodes can  be discharged with follow-up with Dr. Leisa Lenz A

## 2017-07-20 NOTE — Progress Notes (Signed)
07/21/2017 3:30 PM   Betty [name] [birth year] [id number]  Referring provider: Dion Body, MD [facility] [place] [facility], [place] [address]  Chief Complaint  Patient presents with  . Urinary Incontinence    New Patient    HPI: Patient is a 71 -year-old Caucasian female who is referred to [place] by, Dr. Netty Starring, for urinary incontinence with sister, Enid Derry.    Patient states that she has had urinary incontinence for quite awhile.  Patient has incontinence with SUI and UI.   She is experiencing quite a bit of incontinent episodes during the day.  She is experiencing 3 to 4 incontinent episodes during the night.  Her incontinence volume is large going through her clothes.   She is wearing 3 to 4 pads/depends daily at one time.    She is having associated urinary frequency, urgency, dysuria and  nocturia.    She does/does not have a history of urinary tract infections, STI's or injury to the bladder.  She endorses dysuria intermittently.  She denies gross hematuria, suprapubic pain, back pain, abdominal pain or flank pain.  She has not had any recent fevers, chills, nausea or vomiting.   She does not have a history of nephrolithiasis, GU surgery or GU trauma.  She has an Interstim in place from 2012.  She felt the temporary placement of the leads worked well, but she did not find the permanent placement effective.  She is not sexually active.  She is post menopausal.   She admits denies constipation and/or diarrhea.  She is not having pain with bladder filling.    She has not had any recent imaging studies.    She is drinking 3 to 4 cups of water daily.   She is drinking 3 caffeinated beverages daily.  She is not drinking alcoholic beverages daily.    Her PVR today is 0 mL.    Her risk factors for incontinence are obesity, age, caffeine, diabetes, vaginal atrophy, oral estrogens and pelvic surgery.   She is taking antihistamines, decongestants,  benzo's, opioids, OAB medication, ACE inhibitors, alpha-agonists, alpha blockers, antiarrhythmics, diuretics, antidepressants, antipsychotics, skeletal muscle relaxants and/or oral estrogen.    Reviewed referral notes - on oxybutynin 5 mg tid - not at goal   PMH: Past Medical History:  Diagnosis Date  . Acute diastolic heart failure ([place])   . Allergy   . 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 Asthma   . Atrial fibrillation ([place])   . Backache, unspecified   . Cardiomegaly   . COPD (chronic obstructive pulmonary disease) ([place])   . Diabetes mellitus without complication ([place])   . 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 Esophageal reflux   . Headache(784.0)   . Herpes zoster without mention of complication   . Hx: UTI (urinary tract infection)   . Hypertension    heart controlled w CHF  . Nontoxic uninodular goiter   . Obesity, unspecified   . Osteoarthrosis, unspecified whether generalized or localized, unspecified site   . Unspecified sleep apnea   . Urine incontinence    hx of    Surgical History: Past Surgical History:  Procedure Laterality Date  . ABDOMINAL HYSTERECTOMY  1979   complete (for precancerous cells)  . ABLATION  2011 & 2014  . CHOLECYSTECTOMY    . Interstim Placement  2012  . OOPHORECTOMY      Home Medications:  Allergies as of 07/21/2017  Reactions   Flecainide Shortness Of Breath, Other (See Comments)   Reaction: dizziness    Amiodarone Other (See Comments)   Pt states that this medication causes lung bleeding.        Medication List       Accurate as of 07/21/17  3:30 PM. Always use your most recent med list.          albuterol 108 (90 Base) MCG/ACT inhaler Commonly known as:  PROVENTIL HFA;VENTOLIN HFA Inhale 2 puffs into the lungs every 4 (four) hours as needed for wheezing or shortness of breath.   albuterol (2.5 MG/3ML) 0.083% nebulizer solution Commonly known as:  PROVENTIL Take 3 mLs (2.5 mg total) by  nebulization every 4 (four) hours as needed for wheezing or shortness of breath.   aspirin EC 81 MG tablet Take 81 mg by mouth daily.   atorvastatin 20 MG tablet Commonly known as:  LIPITOR Take 20 mg by mouth daily.   azaTHIOprine 50 MG tablet Commonly known as:  IMURAN Take 100 mg by mouth daily.   calcium-vitamin D 500-200 MG-UNIT tablet Commonly known as:  OSCAL WITH D Take 1 tablet by mouth daily.   diltiazem 180 MG 24 hr capsule Commonly known as:  CARDIZEM CD Take 180 mg by mouth daily.   ferrous sulfate 324 (65 Fe) MG Tbec Take 324 mg by mouth daily.   fesoterodine 8 MG Tb24 tablet Commonly known as:  TOVIAZ Take 1 tablet (8 mg total) by mouth daily.   fluticasone 50 MCG/ACT nasal spray Commonly known as:  FLONASE Place 2 sprays into the nose daily.   gabapentin 600 MG tablet Commonly known as:  NEURONTIN Take 600 mg by mouth 2 (two) times daily.   HYDROcodone-acetaminophen 5-325 MG tablet Commonly known as:  NORCO Take 1 tablet by mouth every 6 (six) hours as needed for severe pain.   Lancets 30G Misc Use 1 Units as directed. Check CBG's fasting once daily. Dx: E11.9   loratadine 10 MG tablet Commonly known as:  CLARITIN Take 10 mg by mouth daily.   magnesium oxide 400 (241.3 Mg) MG tablet Commonly known as:  MAG-OX TAKE 1 TABLET (400 MG TOTAL) BY MOUTH ONCE DAILY.   metFORMIN 1000 MG tablet Commonly known as:  GLUCOPHAGE Take 1,000 mg by mouth 2 (two) times daily with a meal.   metolazone 2.5 MG tablet Commonly known as:  ZAROXOLYN Take 2.5 mg by mouth 2 (two) times a week. Take 2.5 mg by mouth on Monday and Thursday.   metoprolol succinate 25 MG 24 hr tablet Commonly known as:  TOPROL-XL Take 25 mg by mouth daily.   oxybutynin 5 MG tablet Commonly known as:  DITROPAN Take 5 mg by mouth 3 (three) times daily.   pantoprazole 40 MG tablet Commonly known as:  PROTONIX Take 40 mg by mouth daily.   potassium chloride SA 20 MEQ  tablet Commonly known as:  K-DUR,KLOR-CON Take 20 mEq by mouth daily.   propafenone 225 MG tablet Commonly known as:  RYTHMOL Take 225 mg by mouth every 12 (twelve) hours.   rOPINIRole 4 MG tablet Commonly known as:  REQUIP Take 4 mg by mouth at bedtime.   spironolactone 50 MG tablet Commonly known as:  ALDACTONE Take 50 mg by mouth daily.   torsemide 20 MG tablet Commonly known as:  DEMADEX Take 80 mg by mouth 2 (two) times daily.   traZODone 100 MG tablet Commonly known as:  DESYREL Take 200 mg by mouth  at bedtime.   venlafaxine 75 MG tablet Commonly known as:  EFFEXOR Take 75 mg by mouth 3 (three) times daily.   warfarin 5 MG tablet Commonly known as:  COUMADIN Take 5 mg by mouth See admin instructions. Take 5 mg everyday except Saturday and Sunday   warfarin 6 MG tablet Commonly known as:  COUMADIN Take 6 mg by mouth See admin instructions. Patient takes 6 mg on Saturday and Sunday       Allergies:  Allergies  Allergen Reactions  . Flecainide Shortness Of Breath and Other (See Comments)    Reaction: dizziness   . Amiodarone Other (See Comments)    Pt states that this medication causes lung bleeding.      Family History: Family History  Problem Relation Age of Onset  . Heart attack Father   . Heart failure Father   . Arthritis Father   . Stroke Father   . Hypertension Father   . Coronary artery disease Brother   . Peripheral vascular disease Brother   . Arthritis Mother   . Colon cancer Mother        colon cancer  . Hypertension Mother   . Cancer Maternal Grandmother        colon cancer  . Arthritis Maternal Grandmother     Social History:  reports that she has quit smoking. Her smoking use included Cigarettes. She has a 7.50 pack-year smoking history. She has never used smokeless tobacco. She reports that she does not drink alcohol or use drugs.  ROS: UROLOGY Frequent Urination?: Yes Hard to postpone urination?: Yes Burning/pain with  urination?: Yes Get up at night to urinate?: Yes Leakage of urine?: Yes Urine stream starts and stops?: No Trouble starting stream?: No Do you have to strain to urinate?: No Blood in urine?: No Urinary tract infection?: No Sexually transmitted disease?: No Injury to kidneys or bladder?: No Painful intercourse?: No Weak stream?: No Currently pregnant?: No Vaginal bleeding?: No Last menstrual period?: n  Gastrointestinal Nausea?: No Vomiting?: No Indigestion/heartburn?: No Diarrhea?: No Constipation?: No  Constitutional Fever: No Night sweats?: No Weight loss?: No Fatigue?: No  Skin Skin rash/lesions?: No Itching?: No  Eyes Blurred vision?: No Double vision?: No  Ears/Nose/Throat Sore throat?: No Sinus problems?: No  Hematologic/Lymphatic Swollen glands?: No Easy bruising?: Yes  Cardiovascular Leg swelling?: No Chest pain?: No  Respiratory Cough?: No Shortness of breath?: Yes  Endocrine Excessive thirst?: No  Musculoskeletal Back pain?: No Joint pain?: No  Neurological Headaches?: No Dizziness?: Yes  Psychologic Depression?: No Anxiety?: No  Physical Exam: BP 112/72   Pulse 77   Ht 5\' 9"  (1.753 m)   Wt 230 lb (104.3 kg)   BMI 33.97 kg/m   Constitutional: Well nourished. Alert and oriented, No acute distress. HEENT: [place] AT, moist mucus membranes. Trachea midline, no masses. Cardiovascular: No clubbing, cyanosis, or edema. Respiratory: Normal respiratory effort, no increased work of breathing. GI: Abdomen is soft, non tender, non distended, no abdominal masses. Liver and spleen not palpable.  No hernias appreciated.  Stool sample for occult testing is not indicated.   GU: No CVA tenderness.  No bladder fullness or masses.  Atrophic external genitalia, normal pubic hair distribution, no lesions.  Normal urethral meatus, no lesions, no prolapse, no discharge.   No urethral masses, tenderness and/or tenderness. No bladder fullness, tenderness  or masses. Pale vagina mucosa, poor estrogen effect, no discharge, no lesions, good pelvic support, Grade I cystocele is noted.  No rectocele is noted.  Cervix, uterus and adnexa are surgically absent.  Anus and perineum are without rashes or lesions.    Skin: No rashes, bruises or suspicious lesions. Lymph: No cervical or inguinal adenopathy. Neurologic: Grossly intact, no focal deficits, moving all 4 extremities. Psychiatric: Normal mood and affect.  Laboratory Data: Lab Results  Component Value Date   WBC 8.9 07/19/2017   HGB 9.6 (L) 07/19/2017   HCT 30.4 (L) 07/19/2017   MCV 75.7 (L) 07/19/2017   PLT 269 07/19/2017    Lab Results  Component Value Date   CREATININE 1.21 (H) 07/19/2017      Lab Results  Component Value Date   TSH 3.249 07/19/2017      Lab Results  Component Value Date   AST 89 (H) 07/19/2017   Lab Results  Component Value Date   ALT 49 07/19/2017    Urinalysis Unremarkable.  See EPIC.  I have independently reviewed the labs.   Pertinent Imaging: Results for KAILENE, STEINHART (MRN [id number]) as of 07/21/2017 15:29  Ref. Range 07/21/2017 14:31  Scan Result Unknown 44ml   I have independently reviewed the films.    Assessment & Plan:    1. Mixed Incontinence  - checked Interstim device during appointment - battery life is low - impedence is good - could not adjust the device  - discussed behavioral therapies, bladder training and bladder control strategies  - pelvic floor muscle training - patient deferred due to cost  - fluid management - encouraged to decrease caffeine  - offered medical therapy with anticholinergic therapy or beta-3 adrenergic receptor agonist and the potential side effects of each therapy - not a candidate for Myrbetriq due to contraindication with her Rythmol  - will change anticholinergic therapy.  Given Toviaz 8mg  samples, # 28.   Advised of the side effects, such as: Dry eyes, dry mouth, constipation, mental confusion  and/or urinary retention.   - RTC to see Dr. Matilde Sprang for further evaluation   2. Nocturia  - I explained to the patient that nocturia is often multi-factorial and difficult to treat.  Sleeping disorders, heart conditions, peripheral vascular disease, diabetes, an enlarged prostate for men, an urethral stricture causing bladder outlet obstruction and/or certain medications can contribute to nocturia.  - I have suggested that the patient avoid caffeine after noon and alcohol in the evening.  He or she may also benefit from fluid restrictions after 6:00 in the evening and voiding just prior to bedtime.  - I have explained that research studies have showed that over 84% of patients with sleep apnea reported frequent nighttime urination.   With sleep apnea, oxygen decreases, carbon dioxide increases, the blood become more acidic, the heart rate drops and blood vessels in the lung constrict.  The body is then alerted that something is very wrong. The sleeper must wake enough to reopen the airway. By this time, the heart is racing and experiences a false signal of fluid overload. The heart excretes a hormone-like protein that tells the body to get rid of sodium and water, resulting in nocturia.  -  I also informed the patient that a recent study noted that decreasing sodium intake to 2.3 grams daily, if they don't have issues with hyponatremia, can also reduce the number of nightly voids  - There is also an increased incidence in sleep apnea with menopause, symptoms include night sweats, daytime sleepiness, depressed mood, and cognitive complaints like poor concentration or problems with short-term memory   - Patient has sleep apnea -  encourage patient to continue to sleep with CPAP    Return for appointment with Dr. Matilde Sprang.  These notes generated with voice recognition software. I apologize for typographical errors.  Zara Council, [facility] [address]  [place], [place] [address] [phone]

## 2017-07-20 NOTE — Care Management Obs Status (Signed)
[place] NOTIFICATION   Patient Details  Name: SAANVI HAKALA MRN: [id number] Date of Birth: 12/02/1946   Medicare Observation Status Notification Given:  Yes    Monna Crean A, RN 07/20/2017, 3:09 PM

## 2017-07-20 NOTE — ED Provider Notes (Signed)
-----------------------------------------   1:12 AM on 07/20/2017 -----------------------------------------   Blood pressure 117/70, pulse 65, temperature 98.9 F (37.2 C), temperature source Oral, resp. rate 16, height 5\' 9"  (1.753 m), weight 104.3 kg (230 lb), SpO2 97 %.  Assuming care from Dr. Jimmye Norman.  In short, Betty [name] is a 71 y.o. female with a chief complaint of Fall .  Refer to the original H&P for additional details.  The current plan of care is to follow-up the results of the blood work and admit the patient.   Clinical Course as of Jul 21 111  Sat Jul 19, 2017  2205 Initially the patient's x-rays were unremarkable except for nasal fracture, she was subsequently ambulated and had a syncopal event. We are ordering IV fluids as well as basic labs.  [JW]    Clinical Course User Index [JW] Earleen Newport, MD   The patient's blood work is unremarkable. She does have a creatinine of 1.21 but her urine has a specific gravity of 1.006. The patient's hemoglobin and hematocrit are 9.6 and 30.4 and is at her baseline. The patient did have a syncopal episode here in the emergency department when she was trying to walk home so we will admit her for further evaluation of her symptoms.   Loney Hering, MD 07/20/17 [phone]

## 2017-07-21 ENCOUNTER — Ambulatory Visit: Payer: Medicare Other | Admitting: Urology

## 2017-07-21 ENCOUNTER — Encounter: Payer: Self-pay | Admitting: Urology

## 2017-07-21 VITALS — BP 112/72 | HR 77 | Ht 69.0 in | Wt 230.0 lb

## 2017-07-21 DIAGNOSIS — R351 Nocturia: Secondary | ICD-10-CM

## 2017-07-21 DIAGNOSIS — R32 Unspecified urinary incontinence: Secondary | ICD-10-CM

## 2017-07-21 LAB — BLADDER SCAN AMB NON-IMAGING

## 2017-07-21 LAB — HEMOGLOBIN A1C
Hgb A1c MFr Bld: 7.3 % — ABNORMAL HIGH (ref 4.8–5.6)
Mean Plasma Glucose: 163 mg/dL

## 2017-07-21 MED ORDER — FESOTERODINE FUMARATE ER 8 MG PO TB24: 8.0000 mg | ORAL_TABLET | Freq: Every day | ORAL | 11 refills | Status: DC

## 2017-07-23 NOTE — Discharge Summary (Signed)
[facility] NAME: Betty [name]    MR#:  [id number]  DATE OF BIRTH:  [birth year]  DATE OF ADMISSION:  07/19/2017 ADMITTING PHYSICIAN: Harrie Foreman, MD  DATE OF DISCHARGE: 07/20/2017  4:35 PM  PRIMARY CARE PHYSICIAN: Dion Body, MD   ADMISSION DIAGNOSIS:  Syncope and collapse [R55] Abrasion [T14.8XXA] Closed fracture of nasal bone, initial encounter [S02.2XXA] Injury of head, initial encounter [R67.89FY] Fall, initial encounter [W19.XXXA]  DISCHARGE DIAGNOSIS:  Active Problems:   Syncope   SECONDARY DIAGNOSIS:   Past Medical History:  Diagnosis Date  . Acute diastolic heart failure ([place])   . Allergy   . 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 Asthma   . Atrial fibrillation ([place])   . Backache, unspecified   . Cardiomegaly   . COPD (chronic obstructive pulmonary disease) ([place])   . Diabetes mellitus without complication ([place])   . 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 Esophageal reflux   . Headache(784.0)   . Herpes zoster without mention of complication   . Hx: UTI (urinary tract infection)   . Hypertension    heart controlled w CHF  . Nontoxic uninodular goiter   . Obesity, unspecified   . Osteoarthrosis, unspecified whether generalized or localized, unspecified site   . Unspecified sleep apnea   . Urine incontinence    hx of     ADMITTING HISTORY  Chief Complaint: Fall HPI: The patient with complicated past medical history significant for atrial fibrillation, vasculitis, mitral valve replacement, COPD, OSA, diastolic heart failure, interstitial lung disease and hypertension presents to the emergency department via EMS after suffering a fall in a department store. The patient landed on her face. CT of the face revealed broken nose as well as multiple contusions and minor fractures. No acute intracranial process. The patient reports that she tripped although she also syncopized  again in the emergency department when she was walked by the nursing staff. Based on contradictory witness reports the patient clearly does not remember the circumstances surrounding also of consciousness, although she does deny chest pain, palpitations, shortness of breath, nausea, vomiting or dizziness. The patient admits to headache at this time however. Due to recurrent syncope the emergency department staff called the hospitalist service for admission.  HOSPITAL COURSE:   * Syncope. Initial fall was due to tripping in a shopping center. Did not black out or hit her head. Presented to the emergency room and received pain medications. And when she tried to walk she had presyncope and dizziness. Was admitted for further workup. Telemetry showed mild atrial fibrillation. No bradycardia or pauses. Discussed case with Dr. Humphrey Rolls who saw the patient. Did not advise any further investigation. Patient has follow-up with her electrophysiologist in 3 days. Her lightheadedness and presyncope were thought to be due to pain medications.  * CT of the face revealed broken nose as well as multiple contusions and minor fractures Follow-up with ENT as outpatient.  CONSULTS OBTAINED:  Treatment Team:  Dionisio David, MD  DRUG ALLERGIES:   Allergies  Allergen Reactions  . Flecainide Shortness Of Breath and Other (See Comments)    Reaction: dizziness   . Amiodarone Other (See Comments)    Pt states that this medication causes lung bleeding.      DISCHARGE MEDICATIONS:   Discharge Medication List as of 07/20/2017  4:08 PM    START taking these medications   Details  HYDROcodone-acetaminophen (NORCO) 5-325 MG  tablet Take 1 tablet by mouth every 6 (six) hours as needed for severe pain., Starting Sun 07/20/2017, Print      CONTINUE these medications which have NOT CHANGED   Details  albuterol (PROVENTIL HFA;VENTOLIN HFA) 108 (90 Base) MCG/ACT inhaler Inhale 2 puffs into the lungs every 4 (four) hours as  needed for wheezing or shortness of breath., Starting Wed 01/17/2016, Normal    albuterol (PROVENTIL) (2.5 MG/3ML) 0.083% nebulizer solution Take 3 mLs (2.5 mg total) by nebulization every 4 (four) hours as needed for wheezing or shortness of breath., Starting Fri 06/28/2016, Normal    aspirin EC 81 MG tablet Take 81 mg by mouth daily., Historical Med    atorvastatin (LIPITOR) 20 MG tablet Take 20 mg by mouth daily., Historical Med    azaTHIOprine (IMURAN) 50 MG tablet Take 100 mg by mouth daily. , Historical Med    calcium-vitamin D (OSCAL WITH D) 500-200 MG-UNIT per tablet Take 1 tablet by mouth daily., Historical Med    diltiazem (CARDIZEM CD) 180 MG 24 hr capsule Take 180 mg by mouth daily., Starting Mon 06/23/2017, Historical Med    ferrous sulfate 324 (65 Fe) MG TBEC Take 324 mg by mouth daily., Starting Mon 06/23/2017, Historical Med    fluticasone (FLONASE) 50 MCG/ACT nasal spray Place 2 sprays into the nose daily. , Starting Tue 06/03/2017, Until Wed 06/03/2018, Historical Med    gabapentin (NEURONTIN) 600 MG tablet Take 600 mg by mouth 2 (two) times daily., Historical Med    loratadine (CLARITIN) 10 MG tablet Take 10 mg by mouth daily., Starting Tue 06/03/2017, Historical Med    magnesium oxide (MAG-OX) 400 (241.3 Mg) MG tablet TAKE 1 TABLET (400 MG TOTAL) BY MOUTH ONCE DAILY., Historical Med    metFORMIN (GLUCOPHAGE) 1000 MG tablet Take 1,000 mg by mouth 2 (two) times daily with a meal. , Historical Med    metolazone (ZAROXOLYN) 2.5 MG tablet Take 2.5 mg by mouth 2 (two) times a week. Take 2.5 mg by mouth on Monday and Thursday., Historical Med    metoprolol succinate (TOPROL-XL) 25 MG 24 hr tablet Take 25 mg by mouth daily., Starting Mon 06/23/2017, Historical Med    oxybutynin (DITROPAN) 5 MG tablet Take 5 mg by mouth 3 (three) times daily. , Historical Med    pantoprazole (PROTONIX) 40 MG tablet Take 40 mg by mouth daily., Historical Med    potassium chloride SA (K-DUR,KLOR-CON)  20 MEQ tablet Take 20 mEq by mouth daily., Starting Fri 06/27/2017, Historical Med    propafenone (RYTHMOL) 225 MG tablet Take 225 mg by mouth every 12 (twelve) hours., Historical Med    rOPINIRole (REQUIP) 4 MG tablet Take 4 mg by mouth at bedtime., Starting Wed 07/02/2017, Historical Med    spironolactone (ALDACTONE) 50 MG tablet Take 50 mg by mouth daily., Historical Med    torsemide (DEMADEX) 20 MG tablet Take 80 mg by mouth 2 (two) times daily. , Historical Med    traZODone (DESYREL) 100 MG tablet Take 200 mg by mouth at bedtime., Historical Med    venlafaxine (EFFEXOR) 75 MG tablet Take 75 mg by mouth 3 (three) times daily., Starting Tue 06/24/2017, Historical Med    !! warfarin (COUMADIN) 5 MG tablet Take 5 mg by mouth See admin instructions. Take 5 mg everyday except Saturday and Sunday, Historical Med    !! warfarin (COUMADIN) 6 MG tablet Take 6 mg by mouth See admin instructions. Patient takes 6 mg on Saturday and Sunday, Starting Wed 07/09/2017,  Until Thu 07/09/2018, Historical Med    Lancets 30G MISC Use 1 Units as directed. Check CBG's fasting once daily. Dx: E11.9, Historical Med     !! - Potential duplicate medications found. Please discuss with provider.    STOP taking these medications     ferrous sulfate 325 (65 FE) MG tablet         Today   VITAL SIGNS:  Blood pressure 100/67, pulse 73, temperature 98.7 F (37.1 C), temperature source Oral, resp. rate 18, height 5\' 9"  (1.753 m), weight 110.9 kg (244 lb 9.6 oz), SpO2 93 %.  I/O:  No intake or output data in the 24 hours ending 07/23/17 1315  PHYSICAL EXAMINATION:  Physical Exam  GENERAL:  71 y.o.-year-old patient lying in the bed with no acute distress.  LUNGS: Normal breath sounds bilaterally, no wheezing, rales,rhonchi or crepitation. No use of accessory muscles of respiration.  CARDIOVASCULAR: S1, S2 normal. No murmurs, rubs, or gallops.  ABDOMEN: Soft, non-tender, non-distended. Bowel sounds present. No  organomegaly or mass.  NEUROLOGIC: Moves all 4 extremities. PSYCHIATRIC: The patient is alert and oriented x 3.  SKIN: Bruising on her face  DATA REVIEW:   CBC  Recent Labs Lab 07/19/17 2305  WBC 8.9  HGB 9.6*  HCT 30.4*  PLT 269    Chemistries   Recent Labs Lab 07/19/17 2305  NA 132*  K 3.9  CL 91*  CO2 30  GLUCOSE 136*  BUN 32*  CREATININE 1.21*  CALCIUM 8.2*  AST 89*  ALT 49  ALKPHOS 75  BILITOT 1.0    Cardiac Enzymes  Recent Labs Lab 07/19/17 2305  TROPONINI <0.03    Microbiology Results  Results for orders placed or performed during the hospital encounter of 03/27/17  Urine culture     Status: Abnormal   Collection Time: 03/30/17 11:48 AM  Result Value Ref Range Status   Specimen Description URINE, CLEAN CATCH  Final   Special Requests Immunocompromised  Final   Culture (A)  Final    >=100,000 COLONIES/mL ESCHERICHIA COLI Confirmed Extended Spectrum Beta-Lactamase Producer (ESBL) Performed at [facility], [place] [address], [place], [place] [address]    Report Status 04/02/2017 FINAL  Final   Organism ID, Bacteria ESCHERICHIA COLI (A)  Final      Susceptibility   Escherichia coli - MIC*    AMPICILLIN >=32 RESISTANT Resistant     CEFAZOLIN >=64 RESISTANT Resistant     CEFTRIAXONE >=64 RESISTANT Resistant     CIPROFLOXACIN >=4 RESISTANT Resistant     GENTAMICIN <=1 SENSITIVE Sensitive     IMIPENEM <=0.25 SENSITIVE Sensitive     NITROFURANTOIN 32 SENSITIVE Sensitive     TRIMETH/SULFA >=320 RESISTANT Resistant     AMPICILLIN/SULBACTAM >=32 RESISTANT Resistant     PIP/TAZO <=4 SENSITIVE Sensitive     Extended ESBL POSITIVE Resistant     * >=100,000 COLONIES/mL ESCHERICHIA COLI    RADIOLOGY:  No results found.  Follow up with PCP in 1 week.  Management plans discussed with the patient, family and they are in agreement.  CODE STATUS:  Code Status History    Date Active Date Inactive Code Status Order ID Comments User Context    07/20/2017  4:37 AM 07/20/2017  7:40 PM Full Code [id number]  Harrie Foreman, MD Inpatient   05/25/2017  7:51 PM 05/27/2017  5:34 PM Full Code [id number]  Gladstone Lighter, MD Inpatient   03/27/2017 10:51 PM 04/09/2017  3:49 PM Full Code [id number]  Lance Coon, MD Inpatient   06/26/2016  1:06 AM 06/28/2016  4:39 PM Full Code [id number]  Lance Coon, MD Inpatient   05/10/2016  4:38 PM 05/12/2016  4:59 PM Full Code [id number]  Theodoro Grist, MD ED   04/01/2016  5:13 PM 04/03/2016  5:58 AM Full Code [id number]  Henreitta Leber, MD Inpatient    Advance Directive Documentation     Most Recent Value  Type of Advance Directive  Healthcare Power of Attorney, Living will  Pre-existing out of facility DNR order (yellow form or pink MOST form)  -  "MOST" Form in Place?  -      TOTAL TIME TAKING CARE OF THIS PATIENT ON DAY OF DISCHARGE: more than 30 minutes.   Hillary Bow R M.D on 07/23/2017 at 1:15 PM  Between 7am to 6pm - Pager - [phone]  After 6pm go to [email or web address] - password [email or web address]  Office  [phone]  CC: Primary care physician; Dion Body, MD  Note: This dictation was prepared with Dragon dictation along with smaller phrase technology. Any transcriptional errors that result from this process are unintentional.

## 2017-07-28 ENCOUNTER — Other Ambulatory Visit: Payer: Self-pay | Admitting: Family Medicine

## 2017-07-28 DIAGNOSIS — Z1231 Encounter for screening mammogram for malignant neoplasm of breast: Secondary | ICD-10-CM

## 2017-07-30 ENCOUNTER — Inpatient Hospital Stay: Admission: RE | Admit: 2017-07-30 | Payer: Medicare Other | Source: Ambulatory Visit

## 2017-08-06 ENCOUNTER — Ambulatory Visit: Admission: RE | Admit: 2017-08-06 | Payer: Medicare Other | Source: Ambulatory Visit | Admitting: Otolaryngology

## 2017-08-06 ENCOUNTER — Encounter: Admission: RE | Payer: Self-pay | Source: Ambulatory Visit

## 2017-08-06 SURGERY — CLOSED REDUCTION, FRACTURE, NASAL BONE
Anesthesia: Choice

## 2017-08-13 DIAGNOSIS — I5033 Acute on chronic diastolic (congestive) heart failure: Secondary | ICD-10-CM | POA: Insufficient documentation

## 2017-09-03 ENCOUNTER — Other Ambulatory Visit (INDEPENDENT_AMBULATORY_CARE_PROVIDER_SITE_OTHER): Payer: Self-pay | Admitting: Vascular Surgery

## 2017-09-04 ENCOUNTER — Encounter (INDEPENDENT_AMBULATORY_CARE_PROVIDER_SITE_OTHER): Payer: Medicare Other

## 2017-09-04 ENCOUNTER — Ambulatory Visit (INDEPENDENT_AMBULATORY_CARE_PROVIDER_SITE_OTHER): Payer: Medicare Other | Admitting: Vascular Surgery

## 2017-09-09 ENCOUNTER — Ambulatory Visit: Payer: Medicare Other | Admitting: Urology

## 2017-09-09 ENCOUNTER — Encounter: Payer: Self-pay | Admitting: Urology

## 2017-09-18 ENCOUNTER — Telehealth: Payer: Self-pay

## 2017-09-18 NOTE — Telephone Encounter (Signed)
Pt called stating she went in to [facility] at the beginning of Sept to have a pacemaker placed. Pt experienced some severe complications of pacemaker placement and has been in the hospital since. Pt was d/c to [facility] for rehab yesterday afternoon. Pt stated that she received a letter from insurance company that Lisbeth Ply was not going to be covered and requested another medication. Pt stated that insurance letter wanted pt to try oxybutynin and vesicare. Per [name] due to pace maker complications no other OAB medications can be given at this time due to side effects. Made pt aware of [name]'s orders. Pt voiced understanding.  Pt then stated that she has interstim placed and she was with the understanding that she was going to see Dr. Matilde Sprang to have that turned back on. Pt stated that she would like to have that procedure completed in 2018 as her insurance will cover at 100% now. Once again reinforced with pt that she will need to be cleared from cardiology prior to have any procedures with [facility]. Pt stated that she has an appt today with cardiology and will ask his opinion. Requested pt have cardiologist notes faxed to [facility] for Dr. Matilde Sprang. Pt voiced understanding and fax number was given.

## 2017-10-08 ENCOUNTER — Encounter (INDEPENDENT_AMBULATORY_CARE_PROVIDER_SITE_OTHER): Payer: Self-pay

## 2017-10-08 ENCOUNTER — Ambulatory Visit (INDEPENDENT_AMBULATORY_CARE_PROVIDER_SITE_OTHER): Payer: Medicare Other | Admitting: Vascular Surgery

## 2017-10-08 ENCOUNTER — Encounter (INDEPENDENT_AMBULATORY_CARE_PROVIDER_SITE_OTHER): Payer: Medicare Other

## 2017-10-10 ENCOUNTER — Emergency Department
Admission: EM | Admit: 2017-10-10 | Discharge: 2017-10-10 | Disposition: A | Discharge status: Home / Self Care | Payer: Medicare Other | Attending: Emergency Medicine | Admitting: Emergency Medicine

## 2017-10-10 DIAGNOSIS — R7989 Other specified abnormal findings of blood chemistry: Secondary | ICD-10-CM | POA: Diagnosis present

## 2017-10-10 DIAGNOSIS — J45909 Unspecified asthma, uncomplicated: Secondary | ICD-10-CM | POA: Insufficient documentation

## 2017-10-10 DIAGNOSIS — J449 Chronic obstructive pulmonary disease, unspecified: Secondary | ICD-10-CM | POA: Insufficient documentation

## 2017-10-10 DIAGNOSIS — R791 Abnormal coagulation profile: Secondary | ICD-10-CM

## 2017-10-10 DIAGNOSIS — E119 Type 2 diabetes mellitus without complications: Secondary | ICD-10-CM | POA: Insufficient documentation

## 2017-10-10 DIAGNOSIS — Z87891 Personal history of nicotine dependence: Secondary | ICD-10-CM | POA: Insufficient documentation

## 2017-10-10 DIAGNOSIS — I5030 Unspecified diastolic (congestive) heart failure: Secondary | ICD-10-CM | POA: Insufficient documentation

## 2017-10-10 DIAGNOSIS — I110 Hypertensive heart disease with heart failure: Secondary | ICD-10-CM | POA: Diagnosis not present

## 2017-10-10 LAB — COMPREHENSIVE METABOLIC PANEL
ALT: 15 U/L (ref 14–54)
AST: 37 U/L (ref 15–41)
Albumin: 3.6 g/dL (ref 3.5–5.0)
Alkaline Phosphatase: 92 U/L (ref 38–126)
Anion gap: 15 (ref 5–15)
BUN: 21 mg/dL — ABNORMAL HIGH (ref 6–20)
CHLORIDE: 97 mmol/L — AB (ref 101–111)
CO2: 26 mmol/L (ref 22–32)
Calcium: 8.5 mg/dL — ABNORMAL LOW (ref 8.9–10.3)
Creatinine, Ser: 1.17 mg/dL — ABNORMAL HIGH (ref 0.44–1.00)
GFR calc Af Amer: 53 mL/min — ABNORMAL LOW (ref >60)
GFR, EST NON AFRICAN AMERICAN: 46 mL/min — AB (ref >60)
Glucose, Bld: 108 mg/dL — ABNORMAL HIGH (ref 65–99)
POTASSIUM: 3.6 mmol/L (ref 3.5–5.1)
SODIUM: 138 mmol/L (ref 135–145)
Total Bilirubin: 0.8 mg/dL (ref 0.3–1.2)
Total Protein: 6.7 g/dL (ref 6.5–8.1)

## 2017-10-10 LAB — CBC WITH DIFFERENTIAL/PLATELET
BASOS ABS: 0.1 10*3/uL (ref 0–0.1)
Basophils Relative: 1 %
EOS ABS: 0.4 10*3/uL (ref 0–0.7)
EOS PCT: 5 %
HCT: 35.9 % (ref 35.0–47.0)
Hemoglobin: 11.3 g/dL — ABNORMAL LOW (ref 12.0–16.0)
Lymphocytes Relative: 6 %
Lymphs Abs: 0.6 10*3/uL — ABNORMAL LOW (ref 1.0–3.6)
MCH: 25.7 pg — AB (ref 26.0–34.0)
MCHC: 31.4 g/dL — ABNORMAL LOW (ref 32.0–36.0)
MCV: 81.7 fL (ref 80.0–100.0)
MONO ABS: 0.8 10*3/uL (ref 0.2–0.9)
Monocytes Relative: 10 %
NEUTROS PCT: 78 %
Neutro Abs: 6.7 10*3/uL — ABNORMAL HIGH (ref 1.4–6.5)
PLATELETS: 290 10*3/uL (ref 150–440)
RBC: 4.39 MIL/uL (ref 3.80–5.20)
RDW: 19 % — AB (ref 11.5–14.5)
WBC: 8.6 10*3/uL (ref 3.6–11.0)

## 2017-10-10 LAB — PROTIME-INR
INR: 5.55 — AB
PROTHROMBIN TIME: 50 s — AB (ref 11.4–15.2)

## 2017-10-10 NOTE — ED Triage Notes (Signed)
Pt reports she had her blood drawn around noon today at home by her [facility] Nurse. This evening they called and told her that her blood levels were too high for her coumidin and she needed to come to the ED.

## 2017-10-10 NOTE — ED Notes (Addendum)
Pt is here for check of her blood level after being called by her home care provider and informed her PT/INR was elevated today when drawn. Pt denies any other complaints.

## 2017-10-10 NOTE — ED Provider Notes (Signed)
[facility] Emergency Department Provider Note       Time seen: ----------------------------------------- 9:14 PM on 10/10/2017 -----------------------------------------     I have reviewed the triage vital signs and the nursing notes.   HISTORY   Chief Complaint Abnormal Lab    HPI Betty [name] is a 71 y.o. female with a history of heart failure, vasculitis, mechanical heart valve who presents to the ED for an elevated Coumadin level. Patient had her blood drawn today by home health nurse. Patient was called later in the day and informed that her Coumadin level was too high and that she needed to come to the ER. She denies fevers, chills or other complaints. She is not having any bleeding.  Past Medical History:  Diagnosis Date  . Acute diastolic heart failure ([place])   . Allergy   . 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 Asthma   . Atrial fibrillation ([place])   . Backache, unspecified   . Cardiomegaly   . COPD (chronic obstructive pulmonary disease) ([place])   . Diabetes mellitus without complication ([place])   . 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 Esophageal reflux   . Headache(784.0)   . Herpes zoster without mention of complication   . Hx: UTI (urinary tract infection)   . Hypertension    heart controlled w CHF  . Nontoxic uninodular goiter   . Obesity, unspecified   . Osteoarthrosis, unspecified whether generalized or localized, unspecified site   . Unspecified sleep apnea   . Urine incontinence    hx of    Patient Active Problem List   Diagnosis Date Noted  . Leg pain 07/14/2017  . Chronic venous insufficiency 07/14/2017  . PAD (peripheral artery disease) ([place]) 07/14/2017  . Postprocedural hemorrhage due to complication of oral surgery 03/27/2017  . Acute blood loss anemia 03/27/2017  . H/O mitral valve replacement with mechanical valve 03/27/2017  . Syncope 06/25/2016  . UTI (lower urinary tract infection)  06/25/2016  . A-fib ([place]) 05/10/2016  . Dyspnea 05/10/2016  . Acute diastolic CHF (congestive heart failure) ([place]) 05/10/2016  . Anemia 05/10/2016  . Anemia 05/10/2016  . Other specified abnormal immunological findings in serum 04/24/2016  . Sepsis ([place]) 04/01/2016  . Prolonged Q-T interval on ECG 03/13/2016  . Mitral stenosis 03/13/2016  . Essential hypertension, benign 03/13/2016  . Diastolic congestive heart failure ([place]) 03/13/2016  . Interstitial lung disease ([place]) 12/29/2015  . Chronic LBP 07/26/2015  . Essential (primary) hypertension 07/26/2015  . Idiopathic insomnia 07/26/2015  . Restless leg 07/26/2015  . Obesity (BMI 30-39.9) 01/07/2015  . Abnormal EKG 09/11/2014  . External nasal lesion 02/26/2014  . Abnormal liver function tests 01/28/2014  . Ankle pain, right 01/28/2014  . Arthralgia of ankle or foot 01/28/2014  . Urinary frequency 12/24/2013  . Hemorrhoids 12/24/2013  . Neck pain on right side 11/10/2013  . Vitamin D deficiency 09/09/2013  . Obstructive sleep apnea 05/30/2013  . Urinary incontinence 05/30/2013  . Diabetes ([place]) 05/30/2013  . History of colonic polyps 05/30/2013  . Controlled type 2 diabetes mellitus without complication ([place]) 86/76/1950  . H/O deep venous thrombosis 05/05/2013  . Depression 04/13/2009  . THYROID NODULE 04/13/2009  . 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 GERD 04/13/2009  . OSTEOARTHRITIS 04/13/2009  . BACK PAIN 04/13/2009  . HEADACHE 04/13/2009  . Gastro-esophageal reflux disease without esophagitis 04/13/2009  . Mild episode of recurrent major depressive disorder ([place]) 04/13/2009  . Benign hypertensive heart  disease with heart failure ([place]) 03/28/2009  . ATRIAL FIBRILLATION 03/28/2009  . DIASTOLIC HEART FAILURE, ACUTE 03/28/2009  . VENTRICULAR HYPERTROPHY, LEFT 03/28/2009    Past Surgical History:  Procedure Laterality Date  . ABDOMINAL HYSTERECTOMY  1979   complete (for precancerous cells)  . ABLATION  2011  & 2014  . CHOLECYSTECTOMY    . Interstim Placement  2012  . OOPHORECTOMY      Allergies Flecainide and Amiodarone  Social History Social History  Substance Use Topics  . Smoking status: Former Smoker    Packs/day: 0.50    Years: 15.00    Types: Cigarettes  . Smokeless tobacco: Never Used     Comment: Has a 20-pack-year history, qutting in 1970.   Marland Kitchen Alcohol use No    Review of Systems Constitutional: Negative for fever. Cardiovascular: Negative for chest pain. Respiratory: Negative for shortness of breath. Gastrointestinal: Negative for abdominal pain, vomiting and diarrhea. Genitourinary: Negative for dysuria. Musculoskeletal: Negative for back pain. Skin: Negative for rash. Neurological: Negative for headaches, focal weakness or numbness.  All systems negative/normal/unremarkable except as stated in the HPI  ____________________________________________   PHYSICAL EXAM:  VITAL SIGNS: ED Triage Vitals  Enc Vitals Group     BP 10/10/17 2003 106/68     Pulse Rate 10/10/17 2003 85     Resp 10/10/17 2003 18     Temp 10/10/17 2003 98.9 F (37.2 C)     Temp Source 10/10/17 2003 Oral     SpO2 10/10/17 2003 93 %     Weight 10/10/17 2006 228 lb (103.4 kg)     Height 10/10/17 2006 5\' 9"  (1.753 m)     Head Circumference --      [place] Flow --      Pain Score --      Pain Loc --      Pain Edu? --      Excl. in [place]? --     Constitutional: Alert and oriented. Well appearing and in no distress. Eyes: Conjunctivae are normal. Normal extraocular movements. ENT   Head: Normocephalic and atraumatic.   Nose: No congestion/rhinnorhea.   Mouth/Throat: Mucous membranes are moist.   Neck: No stridor. Cardiovascular: Normal rate, regular rhythm. loud mechanical click Respiratory: Normal respiratory effort without tachypnea nor retractions. Breath sounds are clear and equal bilaterally. No wheezes/rales/rhonchi. Gastrointestinal: Soft and nontender. Normal bowel  sounds Musculoskeletal: Nontender with normal range of motion in extremities. No lower extremity tenderness nor edema. Neurologic:  Normal speech and language. No gross focal neurologic deficits are appreciated.  Skin:  Skin is warm, dry and intact. No rash noted. Psychiatric: Mood and affect are normal. Speech and behavior are normal.  ___________________________________________  ED COURSE:  Pertinent labs & imaging results that were available during my care of the patient were reviewed by me and considered in my medical decision making (see chart for details). Patient presents for hypocoagulable state, we will assess with labs as indicated   Procedures ____________________________________________   LABS (pertinent positives/negatives)  Labs Reviewed  PROTIME-INR - Abnormal; Notable for the following:       Result Value   Prothrombin Time 50.0 (*)    INR 5.55 (*)    All other components within normal limits  CBC WITH DIFFERENTIAL/PLATELET - Abnormal; Notable for the following:    Hemoglobin 11.3 (*)    MCH 25.7 (*)    MCHC 31.4 (*)    RDW 19.0 (*)    Neutro Abs 6.7 (*)  Lymphs Abs 0.6 (*)    All other components within normal limits  COMPREHENSIVE METABOLIC PANEL - Abnormal; Notable for the following:    Chloride 97 (*)    Glucose, Bld 108 (*)    BUN 21 (*)    Creatinine, Ser 1.17 (*)    Calcium 8.5 (*)    GFR calc non Af Amer 46 (*)    GFR calc Af Amer 53 (*)    All other components within normal limits   ____________________________________________  DIFFERENTIAL DIAGNOSIS   hypocoagulable state, lab error   FINAL ASSESSMENT AND PLAN  elevated INR   Plan: Patient had presented for an elevated INR. Patients labs do reflect elevated INR however she is not currently having any bleeding. at this point she will be advised to stop her Coumadin until Sunday and then contact her doctor on Monday to discuss her Coumadin dosing.   Earleen Newport, MD   Note:  This note was generated in part or whole with voice recognition software. Voice recognition is usually quite accurate but there are transcription errors that can and very often do occur. I apologize for any typographical errors that were not detected and corrected.     Earleen Newport, MD 10/10/17 2116

## 2017-10-27 ENCOUNTER — Ambulatory Visit: Payer: Medicare Other | Admitting: Urology

## 2017-10-27 ENCOUNTER — Encounter: Payer: Self-pay | Admitting: Urology

## 2017-10-27 VITALS — BP 112/72 | HR 70 | Ht 69.0 in | Wt 216.0 lb

## 2017-10-27 DIAGNOSIS — N3946 Mixed incontinence: Secondary | ICD-10-CM | POA: Diagnosis not present

## 2017-10-27 DIAGNOSIS — N39498 Other specified urinary incontinence: Secondary | ICD-10-CM

## 2017-10-27 NOTE — Progress Notes (Signed)
10/27/2017 2:57 PM   Betty [name] [birth year] [id number]  Referring provider: Dion Body, MD [facility] [place], [place] [address]  Chief Complaint  Patient presents with  . Urinary Incontinence  . Follow-up    HPI: Betty [name]: The patient had mixed incontinence.  She had InterStim placed in 2012 but the permanent lead apparently did not work well.  The test stimulation apparently had worked.  Residual was 0 mL and she was on oxybutynin.  The impedance check was good and her battery life was low and she was given Toviaz.  She does have sleep apnea with nighttime frequency and uses CPAP.  Today The patient leaks with coughing and sneezing and sometimes bending and lifting.  Her primary symptom is urgency incontinence and severe bedwetting.  She can use 8-10 pads a day that are quite wet.  She soaks at night.  She has nocturia x2.  She voids frequently and cannot hold urination for 2 hours  I do not think she ever try the Lisbeth Ply because she said her insurance would not be able to pay for it.  She cannot recall taking Vesicare and Myrbetriq but has tried other medications years ago  She is on oral hypoglycemics.  Her bowel movements are normal.  She denies a history of kidney stones and urinary tract infections  Modifying factors: There are no other modifying factors  Associated signs and symptoms: There are no other associated signs and symptoms Aggravating and relieving factors: There are no other aggravating or relieving factors Severity: Moderate Duration: Persistent   PMH: Past Medical History:  Diagnosis Date  . Acute diastolic heart failure ([place])   . Allergy   . 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 Asthma   . Atrial fibrillation ([place])   . Backache, unspecified   . Cardiomegaly   . COPD (chronic obstructive pulmonary disease) ([place])   . Diabetes mellitus without complication ([place])   . 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 Esophageal reflux   . Headache(784.0)   . Herpes zoster without mention of complication   . Hx: UTI (urinary tract infection)   . Hypertension    heart controlled w CHF  . Nontoxic uninodular goiter   . Obesity, unspecified   . Osteoarthrosis, unspecified whether generalized or localized, unspecified site   . Unspecified sleep apnea   . Urine incontinence    hx of    Surgical History: Past Surgical History:  Procedure Laterality Date  . ABDOMINAL HYSTERECTOMY  1979   complete (for precancerous cells)  . ABLATION  2011 & 2014  . CHOLECYSTECTOMY    . Interstim Placement  2012  . OOPHORECTOMY      Home Medications:  Allergies as of 10/27/2017      Reactions   Flecainide Shortness Of Breath, Other (See Comments)   Reaction: dizziness    Amiodarone Other (See Comments)   Pt states that this medication causes lung bleeding.        Medication List        Accurate as of 10/27/17  2:57 PM. Always use your most recent med list.          albuterol 108 (90 Base) MCG/ACT inhaler Commonly known as:  PROVENTIL HFA;VENTOLIN HFA Inhale 2 puffs into the lungs every 4 (four) hours as needed for wheezing or shortness of breath.   albuterol (2.5 MG/3ML) 0.083% nebulizer solution Commonly known as:  PROVENTIL Take 3 mLs (2.5  mg total) by nebulization every 4 (four) hours as needed for wheezing or shortness of breath.   aspirin EC 81 MG tablet Take 81 mg by mouth daily.   atorvastatin 20 MG tablet Commonly known as:  LIPITOR Take 20 mg by mouth daily.   azaTHIOprine 50 MG tablet Commonly known as:  IMURAN Take 100 mg by mouth daily.   calcium-vitamin D 500-200 MG-UNIT tablet Commonly known as:  OSCAL WITH D Take 1 tablet by mouth daily.   diltiazem 180 MG 24 hr capsule Commonly known as:  CARDIZEM CD Take 180 mg by mouth daily.   ferrous sulfate 324 (65 Fe) MG Tbec Take 324 mg by mouth daily.   fesoterodine 8 MG Tb24 tablet Commonly known as:   TOVIAZ Take 1 tablet (8 mg total) by mouth daily.   fluticasone 50 MCG/ACT nasal spray Commonly known as:  FLONASE Place 2 sprays into the nose daily.   gabapentin 600 MG tablet Commonly known as:  NEURONTIN Take 600 mg by mouth 2 (two) times daily.   HYDROcodone-acetaminophen 5-325 MG tablet Commonly known as:  NORCO Take 1 tablet by mouth every 6 (six) hours as needed for severe pain.   Lancets 30G Misc Use 1 Units as directed. Check CBG's fasting once daily. Dx: E11.9   loratadine 10 MG tablet Commonly known as:  CLARITIN Take 10 mg by mouth daily.   magnesium oxide 400 (241.3 Mg) MG tablet Commonly known as:  MAG-OX TAKE 1 TABLET (400 MG TOTAL) BY MOUTH ONCE DAILY.   metFORMIN 1000 MG tablet Commonly known as:  GLUCOPHAGE Take 1,000 mg by mouth 2 (two) times daily with a meal.   metolazone 2.5 MG tablet Commonly known as:  ZAROXOLYN Take 2.5 mg by mouth 2 (two) times a week. Take 2.5 mg by mouth on Monday and Thursday.   metoprolol succinate 25 MG 24 hr tablet Commonly known as:  TOPROL-XL Take 25 mg by mouth daily.   oxybutynin 5 MG tablet Commonly known as:  DITROPAN Take 5 mg by mouth 3 (three) times daily.   pantoprazole 40 MG tablet Commonly known as:  PROTONIX Take 40 mg by mouth daily.   potassium chloride SA 20 MEQ tablet Commonly known as:  K-DUR,KLOR-CON Take 20 mEq by mouth daily.   propafenone 225 MG tablet Commonly known as:  RYTHMOL Take 225 mg by mouth every 12 (twelve) hours.   rOPINIRole 4 MG tablet Commonly known as:  REQUIP Take 4 mg by mouth at bedtime.   spironolactone 50 MG tablet Commonly known as:  ALDACTONE Take 50 mg by mouth daily.   torsemide 20 MG tablet Commonly known as:  DEMADEX Take 80 mg by mouth 2 (two) times daily.   traZODone 100 MG tablet Commonly known as:  DESYREL Take 200 mg by mouth at bedtime.   venlafaxine 75 MG tablet Commonly known as:  EFFEXOR Take 75 mg by mouth 3 (three) times daily.    warfarin 5 MG tablet Commonly known as:  COUMADIN Take 5 mg by mouth See admin instructions. Take 5 mg everyday except Saturday and Sunday   warfarin 6 MG tablet Commonly known as:  COUMADIN Take 6 mg by mouth See admin instructions. Patient takes 6 mg on Saturday and Sunday       Allergies:  Allergies  Allergen Reactions  . Flecainide Shortness Of Breath and Other (See Comments)    Reaction: dizziness   . Amiodarone Other (See Comments)    Pt states that this  medication causes lung bleeding.      Family History: Family History  Problem Relation Age of Onset  . Heart attack Father   . Heart failure Father   . Arthritis Father   . Stroke Father   . Hypertension Father   . Coronary artery disease Brother   . Peripheral vascular disease Brother   . Arthritis Mother   . Colon cancer Mother        colon cancer  . Hypertension Mother   . Cancer Maternal Grandmother        colon cancer  . Arthritis Maternal Grandmother     Social History:  reports that she has quit smoking. Her smoking use included cigarettes. She has a 7.50 pack-year smoking history. she has never used smokeless tobacco. She reports that she does not drink alcohol or use drugs.  ROS: UROLOGY Frequent Urination?: Yes Hard to postpone urination?: Yes Burning/pain with urination?: Yes Get up at night to urinate?: Yes Leakage of urine?: Yes Urine stream starts and stops?: Yes Trouble starting stream?: No Do you have to strain to urinate?: No Blood in urine?: No Urinary tract infection?: Yes Sexually transmitted disease?: No Injury to kidneys or bladder?: No Painful intercourse?: No Weak stream?: No Currently pregnant?: No Vaginal bleeding?: No Last menstrual period?: n  Gastrointestinal Nausea?: No Vomiting?: No Indigestion/heartburn?: No Diarrhea?: No Constipation?: No  Constitutional Fever: No Night sweats?: No Weight loss?: No Fatigue?: No  Skin Skin rash/lesions?: No Itching?:  No  Eyes Blurred vision?: No Double vision?: No  Ears/Nose/Throat Sore throat?: No Sinus problems?: No  Hematologic/Lymphatic Swollen glands?: No Easy bruising?: No  Cardiovascular Leg swelling?: Yes Chest pain?: No  Respiratory Cough?: No Shortness of breath?: Yes  Endocrine Excessive thirst?: No  Musculoskeletal Back pain?: Yes Joint pain?: No  Neurological Headaches?: No Dizziness?: No  Psychologic Depression?: No Anxiety?: No  Physical Exam: BP 112/72   Pulse 70   Ht 5\' 9"  (1.753 m)   Wt 216 lb (98 kg)   BMI 31.90 kg/m   Constitutional:  Alert and oriented, No acute distress. HEENT: [place] AT, moist mucus membranes.  Trachea midline, no masses. Cardiovascular: No clubbing, cyanosis, or edema. Respiratory: Normal respiratory effort, no increased work of breathing. GI: Abdomen is soft, nontender, nondistended, no abdominal masses GU: Well supported bladder neck with a negative cough test but she could not cough hard Skin: No rashes, bruises or suspicious lesions. Lymph: No cervical or inguinal adenopathy. Neurologic: Grossly intact, no focal deficits, moving all 4 extremities. Psychiatric: Normal mood and affect.  Laboratory Data: Lab Results  Component Value Date   WBC 8.6 10/10/2017   HGB 11.3 (L) 10/10/2017   HCT 35.9 10/10/2017   MCV 81.7 10/10/2017   PLT 290 10/10/2017    Lab Results  Component Value Date   CREATININE 1.17 (H) 10/10/2017    No results found for: PSA  No results found for: TESTOSTERONE  Lab Results  Component Value Date   HGBA1C 7.3 (H) 07/19/2017    Urinalysis    Component Value Date/Time   COLORURINE YELLOW (A) 07/19/2017 2305   APPEARANCEUR CLOUDY (A) 07/19/2017 2305   LABSPEC 1.006 07/19/2017 2305   PHURINE 6.0 07/19/2017 2305   GLUCOSEU NEGATIVE 07/19/2017 2305   GLUCOSEU NEGATIVE 05/03/2014 0931   HGBUR SMALL (A) 07/19/2017 2305   BILIRUBINUR NEGATIVE 07/19/2017 2305   BILIRUBINUR neg 01/20/2015 1500    KETONESUR NEGATIVE 07/19/2017 2305   PROTEINUR NEGATIVE 07/19/2017 2305   UROBILINOGEN 1.0 01/20/2015 1500  UROBILINOGEN 0.2 05/03/2014 0931   NITRITE NEGATIVE 07/19/2017 2305   LEUKOCYTESUR TRACE (A) 07/19/2017 2305    Pertinent Imaging: nnone  Assessment & Plan: The patient has significant mixed incontinence and severe bedwetting.  The role of cystoscopy and urodynamics was discussed.  Patient agreed with treatment plan  1. Other urinary incontinence 2.  Mixed incontinence - Urinalysis, Complete   No Follow-up on file.  Reece Packer, MD  [facility] [address] [place], [place] [address] [phone]

## 2017-11-17 ENCOUNTER — Encounter (INDEPENDENT_AMBULATORY_CARE_PROVIDER_SITE_OTHER): Payer: Self-pay | Admitting: Vascular Surgery

## 2017-11-17 ENCOUNTER — Ambulatory Visit (INDEPENDENT_AMBULATORY_CARE_PROVIDER_SITE_OTHER): Payer: Medicare Other

## 2017-11-17 ENCOUNTER — Encounter (INDEPENDENT_AMBULATORY_CARE_PROVIDER_SITE_OTHER): Payer: Self-pay

## 2017-11-17 ENCOUNTER — Ambulatory Visit (INDEPENDENT_AMBULATORY_CARE_PROVIDER_SITE_OTHER): Payer: Medicare Other | Admitting: Vascular Surgery

## 2017-11-17 VITALS — BP 100/66 | HR 83 | Resp 17 | Wt 225.0 lb

## 2017-11-17 DIAGNOSIS — M79604 Pain in right leg: Secondary | ICD-10-CM

## 2017-11-17 DIAGNOSIS — M79605 Pain in left leg: Secondary | ICD-10-CM

## 2017-11-17 DIAGNOSIS — I10 Essential (primary) hypertension: Secondary | ICD-10-CM | POA: Diagnosis not present

## 2017-11-17 DIAGNOSIS — I4891 Unspecified atrial fibrillation: Secondary | ICD-10-CM

## 2017-11-17 DIAGNOSIS — I872 Venous insufficiency (chronic) (peripheral): Secondary | ICD-10-CM | POA: Diagnosis not present

## 2017-11-17 DIAGNOSIS — E119 Type 2 diabetes mellitus without complications: Secondary | ICD-10-CM

## 2017-11-17 DIAGNOSIS — I83813 Varicose veins of bilateral lower extremities with pain: Secondary | ICD-10-CM | POA: Diagnosis not present

## 2017-11-17 NOTE — Progress Notes (Signed)
MRN : [id number]  Betty [name] is a 71 y.o. (08-Jul-1946) female who presents with chief complaint of  Chief Complaint  Patient presents with  . Follow-up    pt conv abi,bil ven reflux  .  History of Present Illness: The patient returns for followup evaluation 3 months after the initial visit. The patient continues to have pain in the lower extremities with dependency. The pain is lessened with elevation. Graduated compression stockings, Class I (20-30 mmHg), have been worn but the stockings do not eliminate the leg pain. Over-the-counter analgesics do not improve the symptoms. The degree of discomfort continues to interfere with daily activities. The patient notes the pain in the legs is causing problems with daily exercise, at the workplace and even with household activities and maintenance such as standing in the kitchen preparing meals and doing dishes.   Venous ultrasound shows normal deep venous system, no evidence of acute or chronic DVT.  Superficial reflux is present in the bilateral GSV and SSV  Current Meds  Medication Sig  . albuterol (PROVENTIL HFA;VENTOLIN HFA) 108 (90 Base) MCG/ACT inhaler Inhale 2 puffs into the lungs every 4 (four) hours as needed for wheezing or shortness of breath.  Marland Kitchen albuterol (PROVENTIL) (2.5 MG/3ML) 0.083% nebulizer solution Take 3 mLs (2.5 mg total) by nebulization every 4 (four) hours as needed for wheezing or shortness of breath.  . fesoterodine (TOVIAZ) 8 MG TB24 tablet Take 1 tablet (8 mg total) by mouth daily.    Past Medical History:  Diagnosis Date  . Acute diastolic heart failure ([place])   . Allergy   . 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)   . Asthma   . Atrial fibrillation ([place])   . Backache, unspecified   . Cardiomegaly   . COPD (chronic obstructive pulmonary disease) ([place])   . Diabetes mellitus without complication ([place])   . 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 Esophageal reflux   . Headache(784.0)   .  Herpes zoster without mention of complication   . Hx: UTI (urinary tract infection)   . Hypertension    heart controlled w CHF  . Nontoxic uninodular goiter   . Obesity, unspecified   . Osteoarthrosis, unspecified whether generalized or localized, unspecified site   . Unspecified sleep apnea   . Urine incontinence    hx of    Past Surgical History:  Procedure Laterality Date  . ABDOMINAL HYSTERECTOMY  1979   complete (for precancerous cells)  . ABLATION  2011 & 2014  . CHOLECYSTECTOMY    . Interstim Placement  2012  . OOPHORECTOMY      Social History Social History   Tobacco Use  . Smoking status: Former Smoker    Packs/day: 0.50    Years: 15.00    Pack years: 7.50    Types: Cigarettes  . Smokeless tobacco: Never Used  . Tobacco comment: Has a 20-pack-year history, qutting in 1970.   Substance Use Topics  . Alcohol use: No    Alcohol/week: 0.0 oz  . Drug use: No    Family History Family History  Problem Relation Age of Onset  . Heart attack Father   . Heart failure Father   . Arthritis Father   . Stroke Father   . Hypertension Father   . Coronary artery disease Brother   . Peripheral vascular disease Brother   . Arthritis Mother   . Colon cancer Mother        colon cancer  .  Hypertension Mother   . Cancer Maternal Grandmother        colon cancer  . Arthritis Maternal Grandmother     Allergies  Allergen Reactions  . Flecainide Shortness Of Breath and Other (See Comments)    Reaction: dizziness   . Amiodarone Other (See Comments)    Pt states that this medication causes lung bleeding.       REVIEW OF SYSTEMS (Negative unless checked)  Constitutional: [] Weight loss  [] Fever  [] Chills Cardiac: [] Chest pain   [] Chest pressure   [] Palpitations   [] Shortness of breath when laying flat   [] Shortness of breath with exertion. Vascular:  [] Pain in legs with walking   [x] Pain in legs with standing  [] History of DVT   [] Phlebitis   [] Swelling in legs    [x] Varicose veins   [] Non-healing ulcers Pulmonary:   [] Uses home oxygen   [] Productive cough   [] Hemoptysis   [] Wheeze  [] COPD   [] Asthma Neurologic:  [] Dizziness   [] Seizures   [] History of stroke   [] History of TIA  [] Aphasia   [] Vissual changes   [] Weakness or numbness in arm   [] Weakness or numbness in leg Musculoskeletal:   [] Joint swelling   [] Joint pain   [] Low back pain Hematologic:  [] Easy bruising  [] Easy bleeding   [] Hypercoagulable state   [] Anemic Gastrointestinal:  [] Diarrhea   [] Vomiting  [] Gastroesophageal reflux/heartburn   [] Difficulty swallowing. Genitourinary:  [] Chronic kidney disease   [] Difficult urination  [] Frequent urination   [] Blood in urine Skin:  [] Rashes   [] Ulcers  Psychological:  [] History of anxiety   []  History of major depression.  Physical Examination  Vitals:   11/17/17 1435  BP: 100/66  Pulse: 83  Resp: 17  Weight: 102.1 kg (225 lb)   Body mass index is 33.23 kg/m. Gen: WD/WN, NAD Head: [place]/AT, No temporalis wasting.  Ear/Nose/Throat: Hearing grossly intact, nares w/o erythema or drainage Eyes: PER, EOMI, sclera nonicteric.  Neck: Supple, no large masses.   Pulmonary:  Good air movement, no audible wheezing bilaterally, no use of accessory muscles.  Cardiac: RRR, no JVD Vascular: Large varicosities present extensively greater than 10 mm bilaterally.  Mild venous stasis changes to the legs bilaterally.  2+ soft pitting edema Vessel Right Left  Radial Palpable Palpable  PT Palpable Palpable  DP Palpable Palpable  Gastrointestinal: Non-distended. No guarding/no peritoneal signs.  Musculoskeletal: M/S 5/5 throughout.  No deformity or atrophy.  Neurologic: CN 2-12 intact. Symmetrical.  Speech is fluent. Motor exam as listed above. Psychiatric: Judgment intact, Mood & affect appropriate for pt's clinical situation. Dermatologic: venous rashes no ulcers noted.  No changes consistent with cellulitis. Lymph : No lichenification or skin changes of  chronic lymphedema.  CBC Lab Results  Component Value Date   WBC 8.6 10/10/2017   HGB 11.3 (L) 10/10/2017   HCT 35.9 10/10/2017   MCV 81.7 10/10/2017   PLT 290 10/10/2017    BMET    Component Value Date/Time   NA 138 10/10/2017 2026   K 3.6 10/10/2017 2026   K 3.7 09/27/2014 1446   CL 97 (L) 10/10/2017 2026   CO2 26 10/10/2017 2026   GLUCOSE 108 (H) 10/10/2017 2026   BUN 21 (H) 10/10/2017 2026   CREATININE 1.17 (H) 10/10/2017 2026   CALCIUM 8.5 (L) 10/10/2017 2026   GFRNONAA 46 (L) 10/10/2017 2026   GFRAA 53 (L) 10/10/2017 2026   CrCl cannot be calculated (Patient's most recent lab result is older than the maximum 21 days allowed.).  COAG Lab Results  Component Value Date   INR 5.55 (HH) 10/10/2017   INR 2.12 07/19/2017   INR 2.69 05/27/2017    Radiology No results found.   Assessment/Plan 1. Varicose veins of both lower extremities with pain Recommend  I have reviewed my previous  discussion with the patient regarding  varicose veins and why they cause symptoms. Patient will continue  wearing graduated compression stockings class 1 on a daily basis, beginning first thing in the morning and removing them in the evening.    In addition, behavioral modification including elevation during the day was again discussed and this will continue.  The patient has utilized over the counter pain medications and has been exercising.  However, at this time conservative therapy has not alleviated the patient's symptoms of leg pain and swelling  Recommend: laser ablation of the right and  left great and small saphenous veins to eliminate the symptoms of pain and swelling of the lower extremities caused by the severe superficial venous reflux disease. She notes her left leg is more painful so this leg will be treated first.  2. Pain in both lower extremities See #1  3. Chronic venous insufficiency No surgery or intervention at this point in time.    I have had a long  discussion with the patient regarding venous insufficiency and why it  causes symptoms. I have discussed with the patient the chronic skin changes that accompany venous insufficiency and the long term sequela such as infection and ulceration.  Patient will begin wearing graduated compression stockings class 1 (20-30 mmHg) or compression wraps on a daily basis a prescription was given. The patient will put the stockings on first thing in the morning and removing them in the evening. The patient is instructed specifically not to sleep in the stockings.    In addition, behavioral modification including several periods of elevation of the lower extremities during the day will be continued. I have demonstrated that proper elevation is a position with the ankles at heart level.  The patient is instructed to begin routine exercise, especially walking on a daily basis  Following the review of the ultrasound the patient will follow up in 2-3 months to reassess the degree of swelling and the control that graduated compression stockings or compression wraps  is offering.   The patient can be assessed for a Lymph Pump at that time  4. Atrial fibrillation, unspecified type ([place]) Continue antiarrhythmia medications as already ordered, these medications have been reviewed and there are no changes at this time.  Continue anticoagulation as ordered by Cardiology Service   5. Essential (primary) hypertension Continue antihypertensive medications as already ordered, these medications have been reviewed and there are no changes at this time.   6. Controlled type 2 diabetes mellitus without complication, unspecified whether long term insulin use ([place]) Continue hypoglycemic medications as already ordered, these medications have been reviewed and there are no changes at this time.  Hgb A1C to be monitored as already arranged by primary service     Hortencia Pilar, MD  11/17/2017 9:25 PM

## 2017-11-24 ENCOUNTER — Other Ambulatory Visit: Payer: Self-pay | Admitting: Urology

## 2017-11-24 ENCOUNTER — Ambulatory Visit (INDEPENDENT_AMBULATORY_CARE_PROVIDER_SITE_OTHER): Payer: Medicare Other | Admitting: Urology

## 2017-11-24 ENCOUNTER — Encounter: Payer: Self-pay | Admitting: Urology

## 2017-11-24 VITALS — BP 114/68 | HR 81 | Ht 69.0 in | Wt 215.0 lb

## 2017-11-24 DIAGNOSIS — R32 Unspecified urinary incontinence: Secondary | ICD-10-CM | POA: Diagnosis not present

## 2017-11-24 DIAGNOSIS — N3946 Mixed incontinence: Secondary | ICD-10-CM | POA: Diagnosis not present

## 2017-11-24 MED ORDER — CIPROFLOXACIN HCL 500 MG PO TABS
500.0000 mg | ORAL_TABLET | Freq: Once | ORAL | Status: AC
  Administered 2017-11-24: 500 mg via ORAL

## 2017-11-24 MED ORDER — TRIMETHOPRIM 100 MG PO TABS: 100.0000 mg | ORAL_TABLET | Freq: Every day | ORAL | 11 refills | Status: DC

## 2017-11-24 NOTE — Addendum Note (Signed)
Addended by: Tommy Rainwater on: 11/24/2017 02:46 PM   Modules accepted: Orders

## 2017-11-24 NOTE — Progress Notes (Signed)
11/24/2017 1:58 PM   Betty [name] May 31, 1946 [id number]  Referring provider: Dion Body, MD [place] [facility] [place], [place] [address]  Chief Complaint  Patient presents with  . Cysto    HPI: [name]: The patient had mixed incontinence.  She had InterStim placed in 2012 but the permanent lead apparently did not work well.  The test stimulation apparently had worked.  Residual was 0 mL and she was on oxybutynin.  The impedance check was good and her battery life was low and she was given Toviaz.  She does have sleep apnea with nighttime frequency and uses CPAP.  Today The patient leaks with coughing and sneezing and sometimes bending and lifting.  Her primary symptom is urgency incontinence and severe bedwetting.  She can use 8-10 pads a day that are quite wet.  She soaks at night.  She has nocturia x2.  She voids frequently and cannot hold urination for 2 hours  I do not think she ever try the Lisbeth Ply because she said her insurance would not be able to pay for it.  She cannot recall taking Vesicare and Myrbetriq but has tried other medications years ago  GU: Well supported bladder neck with a negative cough test but she could not cough hard  The patient has significant mixed incontinence and severe bedwetting.  Today Frequency and incontinence are stable.  The patient does report 3 or 4 bladder infections a year when requestioned.  Cystoscopy: After written consent the patient underwent sterile flexible cystoscopy.  The bladder mucosa and trigone were normal.  There is no stitch or foreign body or carcinoma.  There was sediment in the bladder and some white flecks in the urine looked a little bit cloudy.  The findings are in keeping with a low-grade bladder infection  Urodynamics: The patient did not void and was catheterized for 150 mL.  Her bladder capacity was 250 mL.  Her bladder was unstable reaching a pressure of 20 cm of water associated with  severe leakage.  The contraction was triggered by a cough.  She had mild stress incontinence with mild leakage at 200 mL at 73 cm of water.  She was triggering as well.  During voluntary voiding she voided 195 mL with a maximum flow of 21 mils per second.  Maximum voiding pressure was 17 cm of water.  She emptied efficiently.  EMG activity increased during the voiding phase bladder neck descended 1 cm.  Bladder was hypersensitive.  The details of the urodynamics are signed and dictated       PMH: Past Medical History:  Diagnosis Date  . Acute diastolic heart failure ([place])   . Allergy   . 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 Asthma   . Atrial fibrillation ([place])   . Backache, unspecified   . Cardiomegaly   . COPD (chronic obstructive pulmonary disease) ([place])   . Diabetes mellitus without complication ([place])   . 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 Esophageal reflux   . Headache(784.0)   . Herpes zoster without mention of complication   . Hx: UTI (urinary tract infection)   . Hypertension    heart controlled w CHF  . Nontoxic uninodular goiter   . Obesity, unspecified   . Osteoarthrosis, unspecified whether generalized or localized, unspecified site   . Unspecified sleep apnea   . Urine incontinence    hx of    Surgical History: Past Surgical History:  Procedure  Laterality Date  . ABDOMINAL HYSTERECTOMY  1979   complete (for precancerous cells)  . ABLATION  2011 & 2014  . CHOLECYSTECTOMY    . Interstim Placement  2012  . OOPHORECTOMY      Home Medications:  Allergies as of 11/24/2017      Reactions   Flecainide Shortness Of Breath, Other (See Comments)   Reaction: dizziness    Amiodarone Other (See Comments)   Pt states that this medication causes lung bleeding.        Medication List        Accurate as of 11/24/17  1:58 PM. Always use your most recent med list.          albuterol 108 (90 Base) MCG/ACT inhaler Commonly known as:   PROVENTIL HFA;VENTOLIN HFA Inhale 2 puffs into the lungs every 4 (four) hours as needed for wheezing or shortness of breath.   albuterol (2.5 MG/3ML) 0.083% nebulizer solution Commonly known as:  PROVENTIL Take 3 mLs (2.5 mg total) by nebulization every 4 (four) hours as needed for wheezing or shortness of breath.   aspirin EC 81 MG tablet Take 81 mg by mouth daily.   atorvastatin 20 MG tablet Commonly known as:  LIPITOR Take 20 mg by mouth daily.   azaTHIOprine 50 MG tablet Commonly known as:  IMURAN Take 100 mg by mouth daily.   calcium-vitamin D 500-200 MG-UNIT tablet Commonly known as:  OSCAL WITH D Take 1 tablet by mouth daily.   diltiazem 180 MG 24 hr capsule Commonly known as:  CARDIZEM CD Take 180 mg by mouth daily.   ferrous sulfate 324 (65 Fe) MG Tbec Take 324 mg by mouth daily.   fesoterodine 8 MG Tb24 tablet Commonly known as:  TOVIAZ Take 1 tablet (8 mg total) by mouth daily.   fluticasone 50 MCG/ACT nasal spray Commonly known as:  FLONASE Place 2 sprays into the nose daily.   gabapentin 600 MG tablet Commonly known as:  NEURONTIN Take 600 mg by mouth 2 (two) times daily.   HYDROcodone-acetaminophen 5-325 MG tablet Commonly known as:  NORCO Take 1 tablet by mouth every 6 (six) hours as needed for severe pain.   Lancets 30G Misc Use 1 Units as directed. Check CBG's fasting once daily. Dx: E11.9   loratadine 10 MG tablet Commonly known as:  CLARITIN Take 10 mg by mouth daily.   magnesium oxide 400 (241.3 Mg) MG tablet Commonly known as:  MAG-OX TAKE 1 TABLET (400 MG TOTAL) BY MOUTH ONCE DAILY.   metFORMIN 1000 MG tablet Commonly known as:  GLUCOPHAGE Take 1,000 mg by mouth 2 (two) times daily with a meal.   metolazone 2.5 MG tablet Commonly known as:  ZAROXOLYN Take 2.5 mg by mouth 2 (two) times a week. Take 2.5 mg by mouth on Monday and Thursday.   metoprolol succinate 25 MG 24 hr tablet Commonly known as:  TOPROL-XL Take 25 mg by mouth  daily.   oxybutynin 5 MG tablet Commonly known as:  DITROPAN Take 5 mg by mouth 3 (three) times daily.   pantoprazole 40 MG tablet Commonly known as:  PROTONIX Take 40 mg by mouth daily.   potassium chloride SA 20 MEQ tablet Commonly known as:  K-DUR,KLOR-CON Take 20 mEq by mouth daily.   propafenone 225 MG tablet Commonly known as:  RYTHMOL Take 225 mg by mouth every 12 (twelve) hours.   rOPINIRole 4 MG tablet Commonly known as:  REQUIP Take 4 mg by mouth at bedtime.  spironolactone 50 MG tablet Commonly known as:  ALDACTONE Take 50 mg by mouth daily.   torsemide 20 MG tablet Commonly known as:  DEMADEX Take 80 mg by mouth 2 (two) times daily.   traZODone 100 MG tablet Commonly known as:  DESYREL Take 200 mg by mouth at bedtime.   venlafaxine 75 MG tablet Commonly known as:  EFFEXOR Take 75 mg by mouth 3 (three) times daily.   warfarin 5 MG tablet Commonly known as:  COUMADIN Take 5 mg by mouth See admin instructions. Take 5 mg everyday except Saturday and Sunday   warfarin 6 MG tablet Commonly known as:  COUMADIN Take 6 mg by mouth See admin instructions. Patient takes 6 mg on Saturday and Sunday       Allergies:  Allergies  Allergen Reactions  . Flecainide Shortness Of Breath and Other (See Comments)    Reaction: dizziness   . Amiodarone Other (See Comments)    Pt states that this medication causes lung bleeding.      Family History: Family History  Problem Relation Age of Onset  . Heart attack Father   . Heart failure Father   . Arthritis Father   . Stroke Father   . Hypertension Father   . Coronary artery disease Brother   . Peripheral vascular disease Brother   . Arthritis Mother   . Colon cancer Mother        colon cancer  . Hypertension Mother   . Cancer Maternal Grandmother        colon cancer  . Arthritis Maternal Grandmother     Social History:  reports that she has quit smoking. Her smoking use included cigarettes. She has a  7.50 pack-year smoking history. she has never used smokeless tobacco. She reports that she does not drink alcohol or use drugs.  ROS:                                        Physical Exam: BP 114/68   Pulse 81   Ht 5\' 9"  (1.753 m)   Wt 215 lb (97.5 kg)   BMI 31.75 kg/m   Constitutional:  Alert and oriented, No acute distress. HEENT: [place] AT, moist mucus membranes.  Trachea midline, no masses.   Laboratory Data: Lab Results  Component Value Date   WBC 8.6 10/10/2017   HGB 11.3 (L) 10/10/2017   HCT 35.9 10/10/2017   MCV 81.7 10/10/2017   PLT 290 10/10/2017    Lab Results  Component Value Date   CREATININE 1.17 (H) 10/10/2017    No results found for: PSA  No results found for: TESTOSTERONE  Lab Results  Component Value Date   HGBA1C 7.3 (H) 07/19/2017    Urinalysis    Component Value Date/Time   COLORURINE YELLOW (A) 07/19/2017 2305   APPEARANCEUR CLOUDY (A) 07/19/2017 2305   LABSPEC 1.006 07/19/2017 2305   PHURINE 6.0 07/19/2017 2305   GLUCOSEU NEGATIVE 07/19/2017 2305   GLUCOSEU NEGATIVE 05/03/2014 0931   HGBUR SMALL (A) 07/19/2017 2305   BILIRUBINUR NEGATIVE 07/19/2017 2305   BILIRUBINUR neg 01/20/2015 1500   KETONESUR NEGATIVE 07/19/2017 2305   PROTEINUR NEGATIVE 07/19/2017 2305   UROBILINOGEN 1.0 01/20/2015 1500   UROBILINOGEN 0.2 05/03/2014 0931   NITRITE NEGATIVE 07/19/2017 2305   LEUKOCYTESUR TRACE (A) 07/19/2017 2305    Pertinent Imaging: None  Assessment & Plan: The patient has mixed incontinence.  Based upon the clinical presentation 80% of the problem is an overactive bladder with urgency incontinence and bedwetting.  She has very mild stress incontinence.  She had a positive urine culture in April.  Recognizing that it may not reach her treatment goal as a refractory OAB patient I sent the urine for culture.  I placed her on trimethoprim suppression therapy.  On the next visit if she is still leaking a lot I will likely  offer her antimuscarinics as above that she may not have had.  I will talk to her about botulinum toxin and percutaneous tibial nerve stimulation.  Because of her InterStim I do not think she is a good candidate for studies.  1. Urinary incontinence, unspecified type 2.  Mixed urinary incontinence - ciprofloxacin (CIPRO) tablet 500 mg   No Follow-up on file.  Reece Packer, MD  [facility] [address], [place], [place] [address] [phone]

## 2017-11-28 ENCOUNTER — Telehealth: Payer: Self-pay | Admitting: Urology

## 2017-11-28 LAB — CULTURE, URINE COMPREHENSIVE

## 2017-11-28 NOTE — Telephone Encounter (Signed)
Patient called the office today.  She started the antibiotics on Monday, but they do not seem to be helping her symptoms.  She is having incontinence increasing to where she cannot go anywhere without wetting her pants.  She is asking if there is anything else that Dr. Matilde Sprang can do for her.    Please take a look at her last office visit notes and provide feedback.  Patient can be reached at [phone].

## 2017-12-01 ENCOUNTER — Other Ambulatory Visit: Payer: Medicare Other

## 2017-12-03 NOTE — Telephone Encounter (Signed)
Spoke with pt who stated she has not been taking the [place] as she cant afford it. Offered pt samples of toviaz until her next appt. Pt was grateful. Samples left up front.

## 2017-12-18 ENCOUNTER — Other Ambulatory Visit: Payer: Self-pay

## 2017-12-18 MED ORDER — NITROFURANTOIN MONOHYD MACRO 100 MG PO CAPS: 100.0000 mg | ORAL_CAPSULE | Freq: Two times a day (BID) | ORAL | 0 refills | Status: DC

## 2017-12-18 NOTE — Telephone Encounter (Signed)
Spoke to patient. Gave Ucx results and Rx instructions per Dr. Matilde Sprang. Pt verbalized understanding.

## 2017-12-22 ENCOUNTER — Ambulatory Visit (INDEPENDENT_AMBULATORY_CARE_PROVIDER_SITE_OTHER): Payer: Medicare Other

## 2017-12-22 ENCOUNTER — Other Ambulatory Visit (INDEPENDENT_AMBULATORY_CARE_PROVIDER_SITE_OTHER): Payer: Self-pay | Admitting: Vascular Surgery

## 2017-12-22 ENCOUNTER — Ambulatory Visit (INDEPENDENT_AMBULATORY_CARE_PROVIDER_SITE_OTHER): Payer: Medicare Other | Admitting: Vascular Surgery

## 2017-12-22 ENCOUNTER — Encounter (INDEPENDENT_AMBULATORY_CARE_PROVIDER_SITE_OTHER): Payer: Self-pay | Admitting: Vascular Surgery

## 2017-12-22 VITALS — BP 135/84 | HR 84 | Resp 17 | Ht 69.0 in | Wt 211.0 lb

## 2017-12-22 DIAGNOSIS — I10 Essential (primary) hypertension: Secondary | ICD-10-CM

## 2017-12-22 DIAGNOSIS — I6529 Occlusion and stenosis of unspecified carotid artery: Secondary | ICD-10-CM

## 2017-12-22 DIAGNOSIS — I739 Peripheral vascular disease, unspecified: Secondary | ICD-10-CM | POA: Diagnosis not present

## 2017-12-22 DIAGNOSIS — I6523 Occlusion and stenosis of bilateral carotid arteries: Secondary | ICD-10-CM | POA: Diagnosis not present

## 2017-12-22 DIAGNOSIS — I482 Chronic atrial fibrillation, unspecified: Secondary | ICD-10-CM

## 2017-12-22 DIAGNOSIS — I872 Venous insufficiency (chronic) (peripheral): Secondary | ICD-10-CM | POA: Diagnosis not present

## 2017-12-23 ENCOUNTER — Encounter (INDEPENDENT_AMBULATORY_CARE_PROVIDER_SITE_OTHER): Payer: Self-pay | Admitting: Vascular Surgery

## 2017-12-23 DIAGNOSIS — I6529 Occlusion and stenosis of unspecified carotid artery: Secondary | ICD-10-CM | POA: Insufficient documentation

## 2017-12-23 NOTE — Progress Notes (Signed)
MRN : [id number]  Betty [name] is a 72 y.o. (12-09-1946) female who presents with chief complaint of  Chief Complaint  Patient presents with  . Carotid    Carotid f/u  .  History of Present Illness: The patient is seen for follow up evaluation of carotid stenosis. The carotid stenosis followed by ultrasound.   The patient denies amaurosis fugax. There is no recent history of TIA symptoms or focal motor deficits. There is no prior documented CVA.  The patient is taking enteric-coated aspirin 81 mg daily.  There is no history of migraine headaches. There is no history of seizures.  The patient has a history of coronary artery disease, no recent episodes of angina or shortness of breath. The patient denies PAD or claudication symptoms. There is a history of hyperlipidemia which is being treated with a statin.      Current Meds  Medication Sig  . albuterol (PROVENTIL HFA;VENTOLIN HFA) 108 (90 Base) MCG/ACT inhaler Inhale 2 puffs into the lungs every 4 (four) hours as needed for wheezing or shortness of breath.  Marland Kitchen albuterol (PROVENTIL) (2.5 MG/3ML) 0.083% nebulizer solution Take 3 mLs (2.5 mg total) by nebulization every 4 (four) hours as needed for wheezing or shortness of breath.  Marland Kitchen aspirin EC 81 MG tablet Take 81 mg by mouth daily.  Marland Kitchen atorvastatin (LIPITOR) 20 MG tablet Take 20 mg by mouth daily.  Marland Kitchen azaTHIOprine (IMURAN) 50 MG tablet Take 100 mg by mouth daily.   . calcium-vitamin D (OSCAL WITH D) 500-200 MG-UNIT per tablet Take 1 tablet by mouth daily.  Marland Kitchen diltiazem (CARDIZEM CD) 180 MG 24 hr capsule Take 180 mg by mouth daily.  . ferrous sulfate 324 (65 Fe) MG TBEC Take 324 mg by mouth daily.  . fesoterodine (TOVIAZ) 8 MG TB24 tablet Take 1 tablet (8 mg total) by mouth daily.  . fluticasone (FLONASE) 50 MCG/ACT nasal spray Place 2 sprays into the nose daily.   Marland Kitchen gabapentin (NEURONTIN) 600 MG tablet Take 600 mg by mouth 2 (two) times daily.  Marland Kitchen HYDROcodone-acetaminophen (NORCO)  5-325 MG tablet Take 1 tablet by mouth every 6 (six) hours as needed for severe pain.  . Lancets 30G MISC Use 1 Units as directed. Check CBG's fasting once daily. Dx: E11.9  . loratadine (CLARITIN) 10 MG tablet Take 10 mg by mouth daily.  . magnesium oxide (MAG-OX) 400 (241.3 Mg) MG tablet TAKE 1 TABLET (400 MG TOTAL) BY MOUTH ONCE DAILY.  . metFORMIN (GLUCOPHAGE) 1000 MG tablet Take 1,000 mg by mouth 2 (two) times daily with a meal.   . metolazone (ZAROXOLYN) 2.5 MG tablet Take 2.5 mg by mouth 2 (two) times a week. Take 2.5 mg by mouth on Monday and Thursday.  . metoprolol succinate (TOPROL-XL) 25 MG 24 hr tablet Take 25 mg by mouth daily.  . nitrofurantoin, macrocrystal-monohydrate, (MACROBID) 100 MG capsule Take 1 capsule (100 mg total) by mouth 2 (two) times daily.  Marland Kitchen oxybutynin (DITROPAN) 5 MG tablet Take 5 mg by mouth 3 (three) times daily.   . pantoprazole (PROTONIX) 40 MG tablet Take 40 mg by mouth daily.  . potassium chloride SA (K-DUR,KLOR-CON) 20 MEQ tablet Take 20 mEq by mouth daily.  . propafenone (RYTHMOL) 225 MG tablet Take 225 mg by mouth every 12 (twelve) hours.  Marland Kitchen rOPINIRole (REQUIP) 4 MG tablet Take 4 mg by mouth at bedtime.  Marland Kitchen spironolactone (ALDACTONE) 50 MG tablet Take 50 mg by mouth daily.  Marland Kitchen torsemide (DEMADEX) 20  MG tablet Take 80 mg by mouth 2 (two) times daily.   . traZODone (DESYREL) 100 MG tablet Take 200 mg by mouth at bedtime.  Marland Kitchen trimethoprim (TRIMPEX) 100 MG tablet Take 1 tablet (100 mg total) by mouth daily.  Marland Kitchen venlafaxine (EFFEXOR) 75 MG tablet Take 75 mg by mouth 3 (three) times daily.  Marland Kitchen warfarin (COUMADIN) 5 MG tablet Take 5 mg by mouth See admin instructions. Take 5 mg everyday except Saturday and Sunday  . warfarin (COUMADIN) 6 MG tablet Take 6 mg by mouth See admin instructions. Patient takes 6 mg on Saturday and Sunday    Past Medical History:  Diagnosis Date  . Acute diastolic heart failure ([place])   . Allergy   . 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  Asthma   . Atrial fibrillation ([place])   . Backache, unspecified   . Cardiomegaly   . COPD (chronic obstructive pulmonary disease) ([place])   . Diabetes mellitus without complication ([place])   . 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 Esophageal reflux   . Headache(784.0)   . Herpes zoster without mention of complication   . Hx: UTI (urinary tract infection)   . Hypertension    heart controlled w CHF  . Nontoxic uninodular goiter   . Obesity, unspecified   . Osteoarthrosis, unspecified whether generalized or localized, unspecified site   . Unspecified sleep apnea   . Urine incontinence    hx of    Past Surgical History:  Procedure Laterality Date  . ABDOMINAL HYSTERECTOMY  1979   complete (for precancerous cells)  . ABLATION  2011 & 2014  . CHOLECYSTECTOMY    . Interstim Placement  2012  . OOPHORECTOMY      Social History Social History   Tobacco Use  . Smoking status: Former Smoker    Packs/day: 0.50    Years: 15.00    Pack years: 7.50    Types: Cigarettes  . Smokeless tobacco: Never Used  . Tobacco comment: Has a 20-pack-year history, qutting in 1970.   Substance Use Topics  . Alcohol use: No    Alcohol/week: 0.0 oz  . Drug use: No    Family History Family History  Problem Relation Age of Onset  . Heart attack Father   . Heart failure Father   . Arthritis Father   . Stroke Father   . Hypertension Father   . Coronary artery disease Brother   . Peripheral vascular disease Brother   . Arthritis Mother   . Colon cancer Mother        colon cancer  . Hypertension Mother   . Cancer Maternal Grandmother        colon cancer  . Arthritis Maternal Grandmother     Allergies  Allergen Reactions  . Flecainide Shortness Of Breath and Other (See Comments)    Reaction: dizziness   . Amiodarone Other (See Comments)    Pt states that this medication causes lung bleeding.       REVIEW OF SYSTEMS (Negative unless  checked)  Constitutional: [] Weight loss  [] Fever  [] Chills Cardiac: [] Chest pain   [] Chest pressure   [] Palpitations   [] Shortness of breath when laying flat   [] Shortness of breath with exertion. Vascular:  [] Pain in legs with walking   [] Pain in legs at rest  [] History of DVT   [] Phlebitis   [] Swelling in legs   [] Varicose veins   [] Non-healing ulcers Pulmonary:   [] Uses home oxygen   []   Productive cough   [] Hemoptysis   [] Wheeze  [] COPD   [] Asthma Neurologic:  [] Dizziness   [] Seizures   [] History of stroke   [] History of TIA  [] Aphasia   [] Vissual changes   [] Weakness or numbness in arm   [] Weakness or numbness in leg Musculoskeletal:   [] Joint swelling   [] Joint pain   [] Low back pain Hematologic:  [] Easy bruising  [] Easy bleeding   [] Hypercoagulable state   [] Anemic Gastrointestinal:  [] Diarrhea   [] Vomiting  [] Gastroesophageal reflux/heartburn   [] Difficulty swallowing. Genitourinary:  [] Chronic kidney disease   [] Difficult urination  [] Frequent urination   [] Blood in urine Skin:  [] Rashes   [] Ulcers  Psychological:  [] History of anxiety   []  History of major depression.  Physical Examination  Vitals:   12/22/17 1424 12/22/17 1425  BP: 122/80 135/84  Pulse: 87 84  Resp: 17   Weight: 211 lb (95.7 kg)   Height: 5\' 9"  (1.753 m)    Body mass index is 31.16 kg/m. Gen: WD/WN, NAD Head: [place]/AT, No temporalis wasting.  Ear/Nose/Throat: Hearing grossly intact, nares w/o erythema or drainage Eyes: PER, EOMI, sclera nonicteric.  Neck: Supple, no large masses.   Pulmonary:  Good air movement, no audible wheezing bilaterally, no use of accessory muscles.  Cardiac: RRR, no JVD Vascular:  Carotid bruit Vessel Right Left  Radial Palpable Palpable  Brachial Palpable Palpable  Carotid Palpable Palpable  PT Not Palpable Not Palpable  DP Not Palpable Not Palpable  Gastrointestinal: Non-distended. No guarding/no peritoneal signs.  Musculoskeletal: M/S 5/5 throughout.  No deformity or atrophy.   Neurologic: CN 2-12 intact. Symmetrical.  Speech is fluent. Motor exam as listed above. Psychiatric: Judgment intact, Mood & affect appropriate for pt's clinical situation. Dermatologic: No rashes or ulcers noted.  No changes consistent with cellulitis. Lymph : No lichenification or skin changes of chronic lymphedema.  CBC Lab Results  Component Value Date   WBC 8.6 10/10/2017   HGB 11.3 (L) 10/10/2017   HCT 35.9 10/10/2017   MCV 81.7 10/10/2017   PLT 290 10/10/2017    BMET    Component Value Date/Time   NA 138 10/10/2017 2026   K 3.6 10/10/2017 2026   K 3.7 09/27/2014 1446   CL 97 (L) 10/10/2017 2026   CO2 26 10/10/2017 2026   GLUCOSE 108 (H) 10/10/2017 2026   BUN 21 (H) 10/10/2017 2026   CREATININE 1.17 (H) 10/10/2017 2026   CALCIUM 8.5 (L) 10/10/2017 2026   GFRNONAA 46 (L) 10/10/2017 2026   GFRAA 53 (L) 10/10/2017 2026   CrCl cannot be calculated (Patient's most recent lab result is older than the maximum 21 days allowed.).  COAG Lab Results  Component Value Date   INR 5.55 (HH) 10/10/2017   INR 2.12 07/19/2017   INR 2.69 05/27/2017    Radiology No results found.  Assessment/Plan 1. Bilateral carotid artery stenosis Recommend:  Given the patient's asymptomatic subcritical stenosis no further invasive testing or surgery at this time.  Continue antiplatelet therapy as prescribed Continue management of CAD, HTN and Hyperlipidemia Healthy heart diet,  encouraged exercise at least 4 times per week Follow up in 12 months with duplex ultrasound and physical exam  - VAS US CAROTID; Future  2. PAD (peripheral artery disease) (HCC)  Recommend:  The patient has evidence of atherosclerosis of the lower extremities with claudication.  The patient does not voice lifestyle limiting changes at this point in time.  Noninvasive studies do not suggest clinically significant change.  No invasive studies,  angiography or surgery at this time The patient should continue  walking and begin a more formal exercise program.  The patient should continue antiplatelet therapy and aggressive treatment of the lipid abnormalities  No changes in the patient's medications at this time  The patient should continue wearing graduated compression socks 10-15 mmHg strength to control the mild edema.    3. Chronic venous insufficiency No surgery or intervention at this point in time.    I have had a long discussion with the patient regarding venous insufficiency and why it  causes symptoms. I have discussed with the patient the chronic skin changes that accompany venous insufficiency and the long term sequela such as infection and ulceration.  Patient will begin wearing graduated compression stockings class 1 (20-30 mmHg) or compression wraps on a daily basis a prescription was given. The patient will put the stockings on first thing in the morning and removing them in the evening. The patient is instructed specifically not to sleep in the stockings.    In addition, behavioral modification including several periods of elevation of the lower extremities during the day will be continued. I have demonstrated that proper elevation is a position with the ankles at heart level.  The patient is instructed to begin routine exercise, especially walking on a daily basis  Following the review of the ultrasound the patient will follow up in 12 months to reassess the degree of swelling and the control that graduated compression stockings or compression wraps  is offering.   The patient can be assessed for a Lymph Pump at that time  4. Chronic atrial fibrillation (HCC) Continue antiarrhythmia medications as already ordered, these medications have been reviewed and there are no changes at this time.  Continue anticoagulation as ordered by Cardiology Service   5. Essential (primary) hypertension Continue antihypertensive medications as already ordered, these medications have been reviewed  and there are no changes at this time.     Hortencia Pilar, MD  12/23/2017 3:39 PM

## 2018-01-05 ENCOUNTER — Ambulatory Visit: Payer: Medicare Other | Admitting: Urology

## 2018-01-05 ENCOUNTER — Encounter: Payer: Self-pay | Admitting: Urology

## 2018-01-05 VITALS — BP 103/68 | HR 89 | Ht 69.0 in | Wt 219.7 lb

## 2018-01-05 DIAGNOSIS — R32 Unspecified urinary incontinence: Secondary | ICD-10-CM | POA: Diagnosis not present

## 2018-01-05 DIAGNOSIS — N3946 Mixed incontinence: Secondary | ICD-10-CM

## 2018-01-05 MED ORDER — CIPROFLOXACIN HCL 250 MG PO TABS: 250.0000 mg | ORAL_TABLET | Freq: Every day | ORAL | 0 refills | Status: DC

## 2018-01-05 NOTE — Progress Notes (Signed)
01/05/2018 4:11 PM   [name] [birth year] [id number]  Referring provider: Dion Body, MD [facility] [place], [place] [address]  Chief Complaint  Patient presents with  . Urinary Incontinence    HPI: [name]:The patient had mixed incontinence. She had InterStim placed in 2012 but the permanent lead apparently did not work well. The test stimulation apparently had worked.Residual was 0 mL and she was on oxybutynin.The impedance check was good and her battery life was low and she was given Toviaz.She does have sleep apnea with nighttime frequency and uses CPAP.  Today The patient leaks with coughing and sneezing and sometimes bending and lifting. Her primary symptom is urgency incontinence and severe bedwetting. She can use 8-10 pads a day that are quite wet. She soaks at night. She has nocturia x2.   I do not think she ever try the Lisbeth Ply because she said her insurance would not be able to pay for it. She cannot recall taking Vesicare and Myrbetriq but has tried other medications years ago  QZ:ESPQ supported bladder neck with a negative cough test but she could not coughhard  The patient has significant mixed incontinence and severe bedwetting. The patient does report 3 or 4 bladder infections a year when requestioned.  Cystoscopy: After written consent the patient underwent sterile flexible cystoscopy.  The bladder mucosa and trigone were normal.  There is no stitch or foreign body or carcinoma.  There was sediment in the bladder and some white flecks in the urine looked a little bit cloudy.  The findings are in keeping with a low-grade bladder infection  Urodynamics: The patient did not void and was catheterized for 150 mL.  Her bladder capacity was 250 mL.  Her bladder was unstable reaching a pressure of 20 cm of water associated with severe leakage.  The contraction was triggered by a cough.  She had mild stress  incontinence with mild leakage at 200 mL at 73 cm of water.  She was triggering as well.  During voluntary voiding she voided 195 mL with a maximum flow of 21 mils per second.  Maximum voiding pressure was 17 cm of water.  She emptied efficiently.  EMG activity increased during the voiding phase bladder neck descended 1 cm.  Bladder was hypersensitive.    The patient has mixed incontinence.  Based upon the clinical presentation 80% of the problem is an overactive bladder with urgency incontinence and bedwetting.  She has very mild stress incontinence.  She had a positive urine culture in April.  Recognizing that it may not reach her treatment goal as a refractory OAB patient I sent the urine for culture.  I placed her on trimethoprim suppression therapy.  On the next visit if she is still leaking a lot I will likely offer her antimuscarinics as above that she may not have had.  I will talk to her about botulinum toxin and percutaneous tibial nerve stimulation.  Because of her InterStim I do not think she is a good candidate for studies.  Today The patient has failed Vesicare.  She is currently on oxybutynin trimethoprim.  Insurance would not cover Toviaz.  Her last culture was positive and it was also positive in April.   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  I went over botulinum toxin and percutaneous tibial nerve stimulation in detail having she already failed InterStim.  She cannot remember if she ever took Myrbetriq so I gave her samples.  My usual  templates were discussed.  She would like to have Botox treatment.  She will be followed as per protocol and scheduled for 100 units.  I gave her a 3-day prescription today of ciprofloxacin and she will come in prior for culture on triprim    PMH: Past Medical History:  Diagnosis Date  . Acute diastolic heart failure ([place])   . Allergy   . 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 Asthma   . Atrial fibrillation ([place])   .  Backache, unspecified   . Cardiomegaly   . COPD (chronic obstructive pulmonary disease) ([place])   . Diabetes mellitus without complication ([place])   . 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 Esophageal reflux   . Headache(784.0)   . Herpes zoster without mention of complication   . Hx: UTI (urinary tract infection)   . Hypertension    heart controlled w CHF  . Nontoxic uninodular goiter   . Obesity, unspecified   . Osteoarthrosis, unspecified whether generalized or localized, unspecified site   . Unspecified sleep apnea   . Urine incontinence    hx of    Surgical History: Past Surgical History:  Procedure Laterality Date  . ABDOMINAL HYSTERECTOMY  1979   complete (for precancerous cells)  . ABLATION  2011 & 2014  . CHOLECYSTECTOMY    . Interstim Placement  2012  . OOPHORECTOMY      Home Medications:  Allergies as of 01/05/2018      Reactions   Flecainide Shortness Of Breath, Other (See Comments)   Reaction: dizziness    Amiodarone Other (See Comments)   Pt states that this medication causes lung bleeding.        Medication List        Accurate as of 01/05/18  4:11 PM. Always use your most recent med list.          albuterol 108 (90 Base) MCG/ACT inhaler Commonly known as:  PROVENTIL HFA;VENTOLIN HFA Inhale 2 puffs into the lungs every 4 (four) hours as needed for wheezing or shortness of breath.   albuterol (2.5 MG/3ML) 0.083% nebulizer solution Commonly known as:  PROVENTIL Take 3 mLs (2.5 mg total) by nebulization every 4 (four) hours as needed for wheezing or shortness of breath.   aspirin EC 81 MG tablet Take 81 mg by mouth daily.   atorvastatin 20 MG tablet Commonly known as:  LIPITOR Take 20 mg by mouth daily.   azaTHIOprine 50 MG tablet Commonly known as:  IMURAN Take 100 mg by mouth daily.   calcium-vitamin D 500-200 MG-UNIT tablet Commonly known as:  OSCAL WITH D Take 1 tablet by mouth daily.   diltiazem 180 MG 24 hr  capsule Commonly known as:  CARDIZEM CD Take 180 mg by mouth daily.   ferrous sulfate 324 (65 Fe) MG Tbec Take 324 mg by mouth daily.   fesoterodine 8 MG Tb24 tablet Commonly known as:  TOVIAZ Take 1 tablet (8 mg total) by mouth daily.   fluticasone 50 MCG/ACT nasal spray Commonly known as:  FLONASE Place 2 sprays into the nose daily.   gabapentin 600 MG tablet Commonly known as:  NEURONTIN Take 600 mg by mouth 2 (two) times daily.   Lancets 30G Misc Use 1 Units as directed. Check CBG's fasting once daily. Dx: E11.9   loratadine 10 MG tablet Commonly known as:  CLARITIN Take 10 mg by mouth daily.   magnesium oxide 400 (241.3 Mg) MG tablet Commonly known as:  MAG-OX TAKE 1 TABLET (400 MG TOTAL) BY MOUTH ONCE DAILY.   metFORMIN 1000 MG tablet Commonly known as:  GLUCOPHAGE Take 1,000 mg by mouth 2 (two) times daily with a meal.   metolazone 2.5 MG tablet Commonly known as:  ZAROXOLYN Take 2.5 mg by mouth 2 (two) times a week. Take 2.5 mg by mouth on Monday and Thursday.   metoprolol succinate 25 MG 24 hr tablet Commonly known as:  TOPROL-XL Take 25 mg by mouth daily.   oxybutynin 5 MG tablet Commonly known as:  DITROPAN Take 5 mg by mouth 3 (three) times daily.   pantoprazole 40 MG tablet Commonly known as:  PROTONIX Take 40 mg by mouth daily.   potassium chloride SA 20 MEQ tablet Commonly known as:  K-DUR,KLOR-CON Take 20 mEq by mouth daily.   propafenone 225 MG tablet Commonly known as:  RYTHMOL Take 225 mg by mouth every 12 (twelve) hours.   rOPINIRole 4 MG tablet Commonly known as:  REQUIP Take 4 mg by mouth at bedtime.   spironolactone 50 MG tablet Commonly known as:  ALDACTONE Take 50 mg by mouth daily.   torsemide 20 MG tablet Commonly known as:  DEMADEX Take 80 mg by mouth 2 (two) times daily.   traZODone 100 MG tablet Commonly known as:  DESYREL Take 200 mg by mouth at bedtime.   trimethoprim 100 MG tablet Commonly known as:   TRIMPEX Take 1 tablet (100 mg total) by mouth daily.   venlafaxine 75 MG tablet Commonly known as:  EFFEXOR Take 75 mg by mouth 3 (three) times daily.   warfarin 5 MG tablet Commonly known as:  COUMADIN Take 5 mg by mouth See admin instructions. Take 5 mg everyday except Saturday and Sunday   warfarin 6 MG tablet Commonly known as:  COUMADIN Take 6 mg by mouth See admin instructions. Patient takes 6 mg on Saturday and Sunday       Allergies:  Allergies  Allergen Reactions  . Flecainide Shortness Of Breath and Other (See Comments)    Reaction: dizziness   . Amiodarone Other (See Comments)    Pt states that this medication causes lung bleeding.      Family History: Family History  Problem Relation Age of Onset  . Heart attack Father   . Heart failure Father   . Arthritis Father   . Stroke Father   . Hypertension Father   . Coronary artery disease Brother   . Peripheral vascular disease Brother   . Arthritis Mother   . Colon cancer Mother        colon cancer  . Hypertension Mother   . Cancer Maternal Grandmother        colon cancer  . Arthritis Maternal Grandmother     Social History:  reports that she has quit smoking. Her smoking use included cigarettes. She has a 7.50 pack-year smoking history. she has never used smokeless tobacco. She reports that she does not drink alcohol or use drugs.  ROS: UROLOGY Frequent Urination?: Yes Hard to postpone urination?: Yes Burning/pain with urination?: No Get up at night to urinate?: No Leakage of urine?: No Urine stream starts and stops?: No Trouble starting stream?: No Do you have to strain to urinate?: No Blood in urine?: No Urinary tract infection?: No Sexually transmitted disease?: No Injury to kidneys or bladder?: No Painful intercourse?: No Weak stream?: No Currently pregnant?: No Vaginal bleeding?: No Last menstrual period?: n  Gastrointestinal Nausea?: No Vomiting?: No Indigestion/heartburn?:  No Diarrhea?: No Constipation?: No  Constitutional Fever: No Night sweats?: No Weight loss?: No Fatigue?: No  Skin Skin rash/lesions?: No Itching?: No  Eyes Blurred vision?: No Double vision?: No  Ears/Nose/Throat Sore throat?: No Sinus problems?: No  Hematologic/Lymphatic Swollen glands?: No Easy bruising?: No  Cardiovascular Leg swelling?: No Chest pain?: No  Respiratory Cough?: No Shortness of breath?: No  Endocrine Excessive thirst?: No  Musculoskeletal Back pain?: No Joint pain?: No  Neurological Headaches?: No Dizziness?: No  Psychologic Depression?: No Anxiety?: No  Physical Exam: BP 103/68 (BP Location: Right Arm, Patient Position: Sitting, Cuff Size: Normal)   Pulse 89   Ht 5\' 9"  (1.753 m)   Wt 219 lb 11.2 oz (99.7 kg)   BMI 32.44 kg/m   Constitutional:  Alert and oriented, No acute distress.   Laboratory Data: Lab Results  Component Value Date   WBC 8.6 10/10/2017   HGB 11.3 (L) 10/10/2017   HCT 35.9 10/10/2017   MCV 81.7 10/10/2017   PLT 290 10/10/2017    Lab Results  Component Value Date   CREATININE 1.17 (H) 10/10/2017    No results found for: PSA  No results found for: TESTOSTERONE  Lab Results  Component Value Date   HGBA1C 7.3 (H) 07/19/2017    Urinalysis    Component Value Date/Time   COLORURINE YELLOW (A) 07/19/2017 2305   APPEARANCEUR CLOUDY (A) 07/19/2017 2305   LABSPEC 1.006 07/19/2017 2305   PHURINE 6.0 07/19/2017 2305   GLUCOSEU NEGATIVE 07/19/2017 2305   GLUCOSEU NEGATIVE 05/03/2014 0931   HGBUR SMALL (A) 07/19/2017 2305   BILIRUBINUR NEGATIVE 07/19/2017 2305   BILIRUBINUR neg 01/20/2015 1500   KETONESUR NEGATIVE 07/19/2017 2305   PROTEINUR NEGATIVE 07/19/2017 2305   UROBILINOGEN 1.0 01/20/2015 1500   UROBILINOGEN 0.2 05/03/2014 0931   NITRITE NEGATIVE 07/19/2017 2305   LEUKOCYTESUR TRACE (A) 07/19/2017 2305    Pertinent Imaging: None schedule Botox  Assessment & Plan: And I gave her  Myrbetriq samples  There are no diagnoses linked to this encounter.  No Follow-up on file.  Reece Packer, MD  [facility] [address], [place], [place] [address] [phone]

## 2018-01-15 ENCOUNTER — Ambulatory Visit (INDEPENDENT_AMBULATORY_CARE_PROVIDER_SITE_OTHER): Payer: Medicare Other

## 2018-01-15 DIAGNOSIS — R32 Unspecified urinary incontinence: Secondary | ICD-10-CM | POA: Diagnosis not present

## 2018-01-15 LAB — URINALYSIS, COMPLETE
Bilirubin, UA: NEGATIVE
Glucose, UA: NEGATIVE
Ketones, UA: NEGATIVE
NITRITE UA: NEGATIVE
Protein, UA: NEGATIVE
Specific Gravity, UA: 1.015 (ref 1.005–1.030)
UUROB: 0.2 mg/dL (ref 0.2–1.0)
pH, UA: 7 (ref 5.0–7.5)

## 2018-01-15 LAB — MICROSCOPIC EXAMINATION

## 2018-01-15 NOTE — Progress Notes (Signed)
In and Out Catheterization  Patient is present today for a I & O catheterization due to botox. Patient was cleaned and prepped in a sterile fashion with betadine and Lidocaine 2% jelly was instilled into the urethra.  A 14FR cath was inserted no complications were noted , 116ml of urine return was noted, urine was yellow in color. A clean urine sample was collected for u/a and cx. Bladder was drained  And catheter was removed with out difficulty.    Preformed by: Toniann Fail, LPN   Blood pressure 98/66, pulse 90, height 5\' 9"  (1.753 m), weight 221 lb (100.2 kg).

## 2018-01-18 LAB — CULTURE, URINE COMPREHENSIVE

## 2018-01-19 ENCOUNTER — Other Ambulatory Visit: Payer: Self-pay | Admitting: Family Medicine

## 2018-01-19 MED ORDER — NITROFURANTOIN MACROCRYSTAL 100 MG PO CAPS: 100.0000 mg | ORAL_CAPSULE | Freq: Two times a day (BID) | ORAL | 0 refills | Status: DC

## 2018-01-26 ENCOUNTER — Ambulatory Visit (INDEPENDENT_AMBULATORY_CARE_PROVIDER_SITE_OTHER): Payer: Medicare Other | Admitting: Urology

## 2018-01-26 ENCOUNTER — Encounter: Payer: Self-pay | Admitting: Urology

## 2018-01-26 ENCOUNTER — Inpatient Hospital Stay
Admission: EM | Admit: 2018-01-26 | Discharge: 2018-02-04 | DRG: 987 | Disposition: A | Discharge status: Skilled Nursing Facility | Payer: Medicare Other | Attending: Internal Medicine | Admitting: Internal Medicine

## 2018-01-26 VITALS — BP 94/56 | HR 89

## 2018-01-26 DIAGNOSIS — I5033 Acute on chronic diastolic (congestive) heart failure: Secondary | ICD-10-CM | POA: Diagnosis present

## 2018-01-26 DIAGNOSIS — D500 Iron deficiency anemia secondary to blood loss (chronic): Secondary | ICD-10-CM | POA: Diagnosis present

## 2018-01-26 DIAGNOSIS — K219 Gastro-esophageal reflux disease without esophagitis: Secondary | ICD-10-CM | POA: Diagnosis present

## 2018-01-26 DIAGNOSIS — T8089XA Other complications following infusion, transfusion and therapeutic injection, initial encounter: Principal | ICD-10-CM | POA: Diagnosis present

## 2018-01-26 DIAGNOSIS — Z79899 Other long term (current) drug therapy: Secondary | ICD-10-CM

## 2018-01-26 DIAGNOSIS — J9621 Acute and chronic respiratory failure with hypoxia: Secondary | ICD-10-CM | POA: Diagnosis present

## 2018-01-26 DIAGNOSIS — Y92531 Health care provider office as the place of occurrence of the external cause: Secondary | ICD-10-CM

## 2018-01-26 DIAGNOSIS — R310 Gross hematuria: Secondary | ICD-10-CM | POA: Diagnosis not present

## 2018-01-26 DIAGNOSIS — E669 Obesity, unspecified: Secondary | ICD-10-CM | POA: Diagnosis present

## 2018-01-26 DIAGNOSIS — N3281 Overactive bladder: Secondary | ICD-10-CM

## 2018-01-26 DIAGNOSIS — I43 Cardiomyopathy in diseases classified elsewhere: Secondary | ICD-10-CM | POA: Diagnosis present

## 2018-01-26 DIAGNOSIS — Z9981 Dependence on supplemental oxygen: Secondary | ICD-10-CM

## 2018-01-26 DIAGNOSIS — R32 Unspecified urinary incontinence: Secondary | ICD-10-CM

## 2018-01-26 DIAGNOSIS — I482 Chronic atrial fibrillation: Secondary | ICD-10-CM | POA: Diagnosis present

## 2018-01-26 DIAGNOSIS — Z90710 Acquired absence of both cervix and uterus: Secondary | ICD-10-CM

## 2018-01-26 DIAGNOSIS — J449 Chronic obstructive pulmonary disease, unspecified: Secondary | ICD-10-CM | POA: Diagnosis present

## 2018-01-26 DIAGNOSIS — I480 Paroxysmal atrial fibrillation: Secondary | ICD-10-CM | POA: Diagnosis present

## 2018-01-26 DIAGNOSIS — E119 Type 2 diabetes mellitus without complications: Secondary | ICD-10-CM | POA: Diagnosis present

## 2018-01-26 DIAGNOSIS — Z952 Presence of prosthetic heart valve: Secondary | ICD-10-CM

## 2018-01-26 DIAGNOSIS — R05 Cough: Secondary | ICD-10-CM

## 2018-01-26 DIAGNOSIS — N3946 Mixed incontinence: Secondary | ICD-10-CM

## 2018-01-26 DIAGNOSIS — Z86711 Personal history of pulmonary embolism: Secondary | ICD-10-CM

## 2018-01-26 DIAGNOSIS — N3289 Other specified disorders of bladder: Secondary | ICD-10-CM

## 2018-01-26 DIAGNOSIS — R319 Hematuria, unspecified: Secondary | ICD-10-CM | POA: Diagnosis present

## 2018-01-26 DIAGNOSIS — R059 Cough, unspecified: Secondary | ICD-10-CM

## 2018-01-26 DIAGNOSIS — Z23 Encounter for immunization: Secondary | ICD-10-CM

## 2018-01-26 DIAGNOSIS — Z950 Presence of cardiac pacemaker: Secondary | ICD-10-CM

## 2018-01-26 DIAGNOSIS — Z86718 Personal history of other venous thrombosis and embolism: Secondary | ICD-10-CM

## 2018-01-26 DIAGNOSIS — Y848 Other medical procedures as the cause of abnormal reaction of the patient, or of later complication, without mention of misadventure at the time of the procedure: Secondary | ICD-10-CM | POA: Diagnosis present

## 2018-01-26 DIAGNOSIS — G4733 Obstructive sleep apnea (adult) (pediatric): Secondary | ICD-10-CM | POA: Diagnosis present

## 2018-01-26 DIAGNOSIS — I509 Heart failure, unspecified: Secondary | ICD-10-CM

## 2018-01-26 DIAGNOSIS — I2510 Atherosclerotic heart disease of native coronary artery without angina pectoris: Secondary | ICD-10-CM | POA: Diagnosis present

## 2018-01-26 DIAGNOSIS — I110 Hypertensive heart disease with heart failure: Secondary | ICD-10-CM | POA: Diagnosis present

## 2018-01-26 DIAGNOSIS — J9600 Acute respiratory failure, unspecified whether with hypoxia or hypercapnia: Secondary | ICD-10-CM

## 2018-01-26 DIAGNOSIS — Z7984 Long term (current) use of oral hypoglycemic drugs: Secondary | ICD-10-CM

## 2018-01-26 DIAGNOSIS — E876 Hypokalemia: Secondary | ICD-10-CM | POA: Diagnosis not present

## 2018-01-26 DIAGNOSIS — Z87891 Personal history of nicotine dependence: Secondary | ICD-10-CM

## 2018-01-26 DIAGNOSIS — R791 Abnormal coagulation profile: Secondary | ICD-10-CM | POA: Diagnosis present

## 2018-01-26 DIAGNOSIS — N3941 Urge incontinence: Secondary | ICD-10-CM | POA: Diagnosis not present

## 2018-01-26 DIAGNOSIS — Z7982 Long term (current) use of aspirin: Secondary | ICD-10-CM

## 2018-01-26 DIAGNOSIS — Z7901 Long term (current) use of anticoagulants: Secondary | ICD-10-CM

## 2018-01-26 HISTORY — PX: OTHER SURGICAL HISTORY: SHX169

## 2018-01-26 LAB — CBC WITH DIFFERENTIAL/PLATELET
BASOS PCT: 0 %
Basophils Absolute: 0 10*3/uL (ref 0–0.1)
EOS ABS: 0.2 10*3/uL (ref 0–0.7)
Eosinophils Relative: 2 %
HEMATOCRIT: 41.5 % (ref 35.0–47.0)
Hemoglobin: 13 g/dL (ref 12.0–16.0)
Lymphocytes Relative: 3 %
Lymphs Abs: 0.4 10*3/uL — ABNORMAL LOW (ref 1.0–3.6)
MCH: 27.6 pg (ref 26.0–34.0)
MCHC: 31.3 g/dL — AB (ref 32.0–36.0)
MCV: 88.2 fL (ref 80.0–100.0)
MONO ABS: 1.1 10*3/uL — AB (ref 0.2–0.9)
MONOS PCT: 9 %
Neutro Abs: 10.8 10*3/uL — ABNORMAL HIGH (ref 1.4–6.5)
Neutrophils Relative %: 86 %
PLATELETS: 257 10*3/uL (ref 150–440)
RBC: 4.71 MIL/uL (ref 3.80–5.20)
RDW: 20.1 % — AB (ref 11.5–14.5)
WBC: 12.5 10*3/uL — ABNORMAL HIGH (ref 3.6–11.0)

## 2018-01-26 LAB — URINALYSIS, COMPLETE
BILIRUBIN UA: NEGATIVE
Glucose, UA: NEGATIVE
Nitrite, UA: NEGATIVE
PROTEIN UA: NEGATIVE
SPEC GRAV UA: 1.01 (ref 1.005–1.030)
UUROB: 0.2 mg/dL (ref 0.2–1.0)
pH, UA: 7 (ref 5.0–7.5)

## 2018-01-26 LAB — MICROSCOPIC EXAMINATION: Epithelial Cells (non renal): NONE SEEN /hpf (ref 0–10)

## 2018-01-26 LAB — BLADDER SCAN AMB NON-IMAGING: Scan Result: 700

## 2018-01-26 LAB — COMPREHENSIVE METABOLIC PANEL
ALBUMIN: 3.9 g/dL (ref 3.5–5.0)
ALT: 17 U/L (ref 14–54)
ANION GAP: 11 (ref 5–15)
AST: 39 U/L (ref 15–41)
Alkaline Phosphatase: 96 U/L (ref 38–126)
BILIRUBIN TOTAL: 0.8 mg/dL (ref 0.3–1.2)
BUN: 33 mg/dL — ABNORMAL HIGH (ref 6–20)
CO2: 29 mmol/L (ref 22–32)
Calcium: 8.6 mg/dL — ABNORMAL LOW (ref 8.9–10.3)
Chloride: 94 mmol/L — ABNORMAL LOW (ref 101–111)
Creatinine, Ser: 1.17 mg/dL — ABNORMAL HIGH (ref 0.44–1.00)
GFR calc Af Amer: 53 mL/min — ABNORMAL LOW (ref >60)
GFR calc non Af Amer: 46 mL/min — ABNORMAL LOW (ref >60)
GLUCOSE: 190 mg/dL — AB (ref 65–99)
POTASSIUM: 4.1 mmol/L (ref 3.5–5.1)
SODIUM: 134 mmol/L — AB (ref 135–145)
TOTAL PROTEIN: 7 g/dL (ref 6.5–8.1)

## 2018-01-26 LAB — PROTIME-INR
INR: 2.93
PROTHROMBIN TIME: 30.3 s — AB (ref 11.4–15.2)

## 2018-01-26 MED ORDER — MORPHINE SULFATE (PF) 4 MG/ML IV SOLN
4.0000 mg | Freq: Once | INTRAVENOUS | Status: AC
  Administered 2018-01-26: 4 mg via INTRAVENOUS
  Filled 2018-01-26: qty 1

## 2018-01-26 MED ORDER — HYOSCYAMINE SULFATE 0.125 MG PO TABS
0.2500 mg | ORAL_TABLET | Freq: Once | ORAL | Status: DC
  Filled 2018-01-26: qty 2

## 2018-01-26 MED ORDER — ONABOTULINUMTOXINA 100 UNITS IJ SOLR
100.0000 [IU] | Freq: Once | INTRAMUSCULAR | Status: AC
  Administered 2018-01-26: 100 [IU] via INTRAMUSCULAR

## 2018-01-26 MED ORDER — HYOSCYAMINE SULFATE 0.125 MG PO TBDP
0.2500 mg | ORAL_TABLET | Freq: Once | ORAL | Status: AC
  Administered 2018-01-26: 0.25 mg via SUBLINGUAL

## 2018-01-26 MED ORDER — LIDOCAINE HCL 2 % IJ SOLN
50.0000 mL | Freq: Once | INTRAMUSCULAR | Status: AC
  Administered 2018-01-26: 1000 mg

## 2018-01-26 MED ORDER — LIDOCAINE HCL 2 % EX GEL
1.0000 "application " | Freq: Once | CUTANEOUS | Status: AC
  Administered 2018-01-26: 1 via URETHRAL

## 2018-01-26 MED ORDER — FENTANYL CITRATE (PF) 100 MCG/2ML IJ SOLN
50.0000 ug | Freq: Once | INTRAMUSCULAR | Status: AC
  Administered 2018-01-26: 50 ug via INTRAVENOUS
  Filled 2018-01-26: qty 2

## 2018-01-26 MED ORDER — PHENAZOPYRIDINE HCL 200 MG PO TABS
200.0000 mg | ORAL_TABLET | Freq: Once | ORAL | Status: AC
  Administered 2018-01-26: 200 mg via ORAL
  Filled 2018-01-26: qty 1

## 2018-01-26 NOTE — Addendum Note (Signed)
Addended by: Lestine Box on: 01/26/2018 04:26 PM   Modules accepted: Orders

## 2018-01-26 NOTE — Progress Notes (Addendum)
01/26/2018 9:41 AM   Betty [name] 09-18-1946 [id number]  Referring provider: Dion Body, MD [facility] [place], [name] [address]  Chief Complaint  Patient presents with  . Cysto    Botox    HPI: The patient has mixed incontinence. Based upon the clinical presentation 80% of the problem is an overactive bladder with urgency incontinence and bedwetting. She has very mild stress incontinence. She had a positive urine culture in April. Recognizing that it may not reach her treatment goal as a refractory OAB patient I sent the urine for culture. I placed her on trimethoprim suppression therapy. On the next visit if she is still leaking a lot I will likely offer her antimuscarinics as above that she may not have had. I will talk to her about botulinum toxin and percutaneous tibial nerve stimulation. Because of her InterStim I do not think she is a good candidate for studies.  Today The patient has failed Vesicare.  She is currently on oxybutynin trimethoprim.  Insurance would not cover Toviaz.  Her last culture was positive and it was also positive in April.   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  I went over botulinum toxin and percutaneous tibial nerve stimulation in detail having she already failed InterStim.  She cannot remember if she ever took Myrbetriq so I gave her samples.  My usual templates were discussed.  She would like to have Botox treatment.  She will be followed as per protocol and scheduled for 100 units.  I gave her a 3-day prescription today of ciprofloxacin and she will come in prior for culture on triprim   Today Clinically the patient is infection free and finishing a recent antibiotic.  She will go back on the once a day trimethoprim.  She might of had a breakthrough.  I will not change trimethoprim yet.  Frequency stable.  Myrbetriq did not work  Chiropodist after written and  verbal consent patient underwent Botox injection therapy.  Bladder mucosa and trigone were normal.  There is no stitch foreign body or carcinoma or cystitis.  Trigone was easy to identify  I injected 100 units of Botox and 10 cc in normal saline with 10 injections with my usual template at 5 and 0:32 and cephalad to the interureteric ridge.  She tolerated the injections very well.  There was no bleeding.  Bladder was not overfilled.  Voiding trial started   PMH: Past Medical History:  Diagnosis Date  . Acute diastolic heart failure ([place])   . Allergy   . 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 Asthma   . Atrial fibrillation ([place])   . Backache, unspecified   . Cardiomegaly   . COPD (chronic obstructive pulmonary disease) ([place])   . Diabetes mellitus without complication ([place])   . 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 Esophageal reflux   . Headache(784.0)   . Herpes zoster without mention of complication   . Hx: UTI (urinary tract infection)   . Hypertension    heart controlled w CHF  . Nontoxic uninodular goiter   . Obesity, unspecified   . Osteoarthrosis, unspecified whether generalized or localized, unspecified site   . Unspecified sleep apnea   . Urine incontinence    hx of    Surgical History: Past Surgical History:  Procedure Laterality Date  . ABDOMINAL HYSTERECTOMY  1979   complete (for precancerous cells)  . ABLATION  2011 &  2014  . CHOLECYSTECTOMY    . Interstim Placement  2012  . OOPHORECTOMY      Home Medications:  Allergies as of 01/26/2018      Reactions   Flecainide Shortness Of Breath, Other (See Comments)   Reaction: dizziness    Amiodarone Other (See Comments)   Pt states that this medication causes lung bleeding.        Medication List        Accurate as of 01/26/18  9:41 AM. Always use your most recent med list.          albuterol 108 (90 Base) MCG/ACT inhaler Commonly known as:  PROVENTIL HFA;VENTOLIN HFA Inhale 2  puffs into the lungs every 4 (four) hours as needed for wheezing or shortness of breath.   albuterol (2.5 MG/3ML) 0.083% nebulizer solution Commonly known as:  PROVENTIL Take 3 mLs (2.5 mg total) by nebulization every 4 (four) hours as needed for wheezing or shortness of breath.   aspirin EC 81 MG tablet Take 81 mg by mouth daily.   atorvastatin 20 MG tablet Commonly known as:  LIPITOR Take 20 mg by mouth daily.   azaTHIOprine 50 MG tablet Commonly known as:  IMURAN Take 100 mg by mouth daily.   calcium-vitamin D 500-200 MG-UNIT tablet Commonly known as:  OSCAL WITH D Take 1 tablet by mouth daily.   ciprofloxacin 250 MG tablet Commonly known as:  CIPRO Take 1 tablet (250 mg total) by mouth daily. Take 1 tablet the day before and the day of and the day after procedure.   diltiazem 180 MG 24 hr capsule Commonly known as:  CARDIZEM CD Take 180 mg by mouth daily.   ferrous sulfate 324 (65 Fe) MG Tbec Take 324 mg by mouth daily.   fesoterodine 8 MG Tb24 tablet Commonly known as:  TOVIAZ Take 1 tablet (8 mg total) by mouth daily.   fluticasone 50 MCG/ACT nasal spray Commonly known as:  FLONASE Place 2 sprays into the nose daily.   gabapentin 600 MG tablet Commonly known as:  NEURONTIN Take 600 mg by mouth 2 (two) times daily.   Lancets 30G Misc Use 1 Units as directed. Check CBG's fasting once daily. Dx: E11.9   loratadine 10 MG tablet Commonly known as:  CLARITIN Take 10 mg by mouth daily.   magnesium oxide 400 (241.3 Mg) MG tablet Commonly known as:  MAG-OX TAKE 1 TABLET (400 MG TOTAL) BY MOUTH ONCE DAILY.   metFORMIN 1000 MG tablet Commonly known as:  GLUCOPHAGE Take 1,000 mg by mouth 2 (two) times daily with a meal.   metolazone 2.5 MG tablet Commonly known as:  ZAROXOLYN Take 2.5 mg by mouth 2 (two) times a week. Take 2.5 mg by mouth on Monday and Thursday.   metoprolol succinate 25 MG 24 hr tablet Commonly known as:  TOPROL-XL Take 25 mg by mouth  daily.   nitrofurantoin 100 MG capsule Commonly known as:  MACRODANTIN Take 1 capsule (100 mg total) by mouth 2 (two) times daily.   oxybutynin 5 MG tablet Commonly known as:  DITROPAN Take 5 mg by mouth 3 (three) times daily.   pantoprazole 40 MG tablet Commonly known as:  PROTONIX Take 40 mg by mouth daily.   potassium chloride SA 20 MEQ tablet Commonly known as:  K-DUR,KLOR-CON Take 20 mEq by mouth daily.   propafenone 225 MG tablet Commonly known as:  RYTHMOL Take 225 mg by mouth every 12 (twelve) hours.   rOPINIRole 4 MG  tablet Commonly known as:  REQUIP Take 4 mg by mouth at bedtime.   spironolactone 50 MG tablet Commonly known as:  ALDACTONE Take 50 mg by mouth daily.   torsemide 20 MG tablet Commonly known as:  DEMADEX Take 80 mg by mouth 2 (two) times daily.   traZODone 100 MG tablet Commonly known as:  DESYREL Take 200 mg by mouth at bedtime.   trimethoprim 100 MG tablet Commonly known as:  TRIMPEX Take 1 tablet (100 mg total) by mouth daily.   venlafaxine 75 MG tablet Commonly known as:  EFFEXOR Take 75 mg by mouth 3 (three) times daily.   warfarin 5 MG tablet Commonly known as:  COUMADIN Take 5 mg by mouth See admin instructions. Take 5 mg everyday except Saturday and Sunday   warfarin 6 MG tablet Commonly known as:  COUMADIN Take 6 mg by mouth See admin instructions. Patient takes 6 mg on Saturday and Sunday       Allergies:  Allergies  Allergen Reactions  . Flecainide Shortness Of Breath and Other (See Comments)    Reaction: dizziness   . Amiodarone Other (See Comments)    Pt states that this medication causes lung bleeding.      Family History: Family History  Problem Relation Age of Onset  . Heart attack Father   . Heart failure Father   . Arthritis Father   . Stroke Father   . Hypertension Father   . Coronary artery disease Brother   . Peripheral vascular disease Brother   . Arthritis Mother   . Colon cancer Mother         colon cancer  . Hypertension Mother   . Cancer Maternal Grandmother        colon cancer  . Arthritis Maternal Grandmother     Social History:  reports that she has quit smoking. Her smoking use included cigarettes. She has a 7.50 pack-year smoking history. she has never used smokeless tobacco. She reports that she does not drink alcohol or use drugs.  ROS:                                        Physical Exam: BP (!) 94/56   Pulse 89   Constitutional:  Alert and oriented, No acute distress. HEENT: [place] AT, moist mucus membranes.  Trachea midline, no masses. Cardiovascular: No clubbing, cyanosis, or edema. Respiratory: Normal respiratory effort, no increased work of breathing. GI: Abdomen is soft, nontender, nondistended, no abdominal masses  Laboratory Data: Lab Results  Component Value Date   WBC 8.6 10/10/2017   HGB 11.3 (L) 10/10/2017   HCT 35.9 10/10/2017   MCV 81.7 10/10/2017   PLT 290 10/10/2017    Lab Results  Component Value Date   CREATININE 1.17 (H) 10/10/2017    No results found for: PSA  No results found for: TESTOSTERONE  Lab Results  Component Value Date   HGBA1C 7.3 (H) 07/19/2017    Urinalysis    Component Value Date/Time   COLORURINE YELLOW (A) 07/19/2017 2305   APPEARANCEUR Cloudy (A) 01/15/2018 1151   LABSPEC 1.006 07/19/2017 2305   PHURINE 6.0 07/19/2017 2305   GLUCOSEU Negative 01/15/2018 1151   GLUCOSEU NEGATIVE 05/03/2014 0931   HGBUR SMALL (A) 07/19/2017 2305   BILIRUBINUR Negative 01/15/2018 [place] 07/19/2017 2305   PROTEINUR Negative 01/15/2018 [place] 07/19/2017 2305  UROBILINOGEN 1.0 01/20/2015 1500   UROBILINOGEN 0.2 05/03/2014 0931   NITRITE Negative 01/15/2018 1151   NITRITE NEGATIVE 07/19/2017 2305   LEUKOCYTESUR 1+ (A) 01/15/2018 1151    Pertinent Imaging: none  Assessment & Plan:   Reassess in 3 weeks  Addendum: The patient called with significant  abdominal pain.  She was brought in.  She was scanned for 700 mL.  She had dark blood in the urine.  Her bladder was gently irrigated.  She did not have a lot of clots.  37 French Foley catheter was inserted.  I recommended a trial of voiding on Thursday morning.  She is on antibiotics noted.  She understands that retention would not be from the Botox this quickly  1. Urinary incontinence, unspecified type  - Urinalysis, Complete   No Follow-up on file.  Reece Packer, MD  [facility] [address] [place], [place] [address] [phone]

## 2018-01-26 NOTE — ED Notes (Signed)
Pt is having bladder spasms that come and go.

## 2018-01-26 NOTE — ED Triage Notes (Addendum)
Pt arrives via EMS with complaints of blood in urine. EMS reports pt had Botox treatment to bladder this morning around 0830 at [facility] . Pt has a 76f foley leg bag in place. Noticeable amount of blood urine in leg bag.

## 2018-01-26 NOTE — ED Notes (Signed)
Pt is bouncing in bed and crying she states this is due to the pain. Pt is diaphoretic with wet hair at this time

## 2018-01-26 NOTE — ED Provider Notes (Signed)
[facility] Emergency Department Provider Note  ____________________________________________   None    (approximate)  I have reviewed the triage vital signs and the nursing notes.   HISTORY  Chief Complaint Hematuria  HPI Betty [name] is a 72 y.o. female who presents to the emergency department for evaluation of hematuria.  Patient had a Botox procedure and a percutaneous tibial nerve stimulation done as an outpatient by urology this morning about 830.  She states that she began to have bladder spasms approximately 3 hours prior to arrival.  She states that she has had hematuria since the procedure.  She is concerned at this time because of the severity of the bladder spasms and the amount of blood in the urine.  She has not attempted any alleviating measures for this complaint prior to arrival.   Past Medical History:  Diagnosis Date  . Acute diastolic heart failure ([place])   . Allergy   . 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 Asthma   . Atrial fibrillation ([place])   . Backache, unspecified   . Cardiomegaly   . COPD (chronic obstructive pulmonary disease) ([place])   . Diabetes mellitus without complication ([place])   . 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 Esophageal reflux   . Headache(784.0)   . Herpes zoster without mention of complication   . Hx: UTI (urinary tract infection)   . Hypertension    heart controlled w CHF  . Nontoxic uninodular goiter   . Obesity, unspecified   . Osteoarthrosis, unspecified whether generalized or localized, unspecified site   . Unspecified sleep apnea   . Urine incontinence    hx of    Patient Active Problem List   Diagnosis Date Noted  . Carotid stenosis 12/23/2017  . Varicose veins of both lower extremities with pain 11/17/2017  . Leg pain 07/14/2017  . Chronic venous insufficiency 07/14/2017  . PAD (peripheral artery disease) ([place]) 07/14/2017  . Postprocedural hemorrhage due to  complication of oral surgery 03/27/2017  . Acute blood loss anemia 03/27/2017  . H/O mitral valve replacement with mechanical valve 03/27/2017  . Syncope 06/25/2016  . UTI (lower urinary tract infection) 06/25/2016  . A-fib ([place]) 05/10/2016  . Dyspnea 05/10/2016  . Acute diastolic CHF (congestive heart failure) ([place]) 05/10/2016  . Anemia 05/10/2016  . Anemia 05/10/2016  . Other specified abnormal immunological findings in serum 04/24/2016  . Sepsis ([place]) 04/01/2016  . Prolonged Q-T interval on ECG 03/13/2016  . Mitral stenosis 03/13/2016  . Essential hypertension, benign 03/13/2016  . Diastolic congestive heart failure ([place]) 03/13/2016  . Interstitial lung disease ([place]) 12/29/2015  . Chronic LBP 07/26/2015  . Essential (primary) hypertension 07/26/2015  . Idiopathic insomnia 07/26/2015  . Restless leg 07/26/2015  . Obesity (BMI 30-39.9) 01/07/2015  . Abnormal EKG 09/11/2014  . External nasal lesion 02/26/2014  . Abnormal liver function tests 01/28/2014  . Ankle pain, right 01/28/2014  . Arthralgia of ankle or foot 01/28/2014  . Urinary frequency 12/24/2013  . Hemorrhoids 12/24/2013  . Neck pain on right side 11/10/2013  . Vitamin D deficiency 09/09/2013  . Obstructive sleep apnea 05/30/2013  . Urinary incontinence 05/30/2013  . Diabetes ([place]) 05/30/2013  . History of colonic polyps 05/30/2013  . Controlled type 2 diabetes mellitus without complication ([place]) 29/79/8921  . H/O deep venous thrombosis 05/05/2013  . Depression 04/13/2009  . THYROID NODULE 04/13/2009  . 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 GERD 04/13/2009  . OSTEOARTHRITIS  04/13/2009  . BACK PAIN 04/13/2009  . HEADACHE 04/13/2009  . Gastro-esophageal reflux disease without esophagitis 04/13/2009  . Mild episode of recurrent major depressive disorder ([place]) 04/13/2009  . 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 ATRIAL FIBRILLATION 03/28/2009  . DIASTOLIC HEART FAILURE,  ACUTE 03/28/2009  . VENTRICULAR HYPERTROPHY, LEFT 03/28/2009    Past Surgical History:  Procedure Laterality Date  . ABDOMINAL HYSTERECTOMY  1979   complete (for precancerous cells)  . ABLATION  2011 & 2014  . bladder botox  01/26/2018  . CHOLECYSTECTOMY    . Interstim Placement  2012  . OOPHORECTOMY      Prior to Admission medications   Medication Sig Start Date End Date Taking? Authorizing Provider  albuterol (PROVENTIL HFA;VENTOLIN HFA) 108 (90 Base) MCG/ACT inhaler Inhale 2 puffs into the lungs every 4 (four) hours as needed for wheezing or shortness of breath. 01/17/16  Yes Kasa, Maretta Bees, MD  albuterol (PROVENTIL) (2.5 MG/3ML) 0.083% nebulizer solution Take 3 mLs (2.5 mg total) by nebulization every 4 (four) hours as needed for wheezing or shortness of breath. 06/28/16  Yes Gladstone Lighter, MD  aspirin EC 81 MG tablet Take 81 mg by mouth daily.   Yes [provider]  atorvastatin (LIPITOR) 20 MG tablet Take 20 mg by mouth daily.   Yes [provider]  azaTHIOprine (IMURAN) 50 MG tablet Take 100 mg by mouth daily.    Yes [provider]  ferrous sulfate 325 (65 FE) MG EC tablet Take 325 mg by mouth daily.  06/23/17  Yes [provider]  gabapentin (NEURONTIN) 600 MG tablet Take 600 mg by mouth 2 (two) times daily.   Yes [provider]  loratadine (CLARITIN) 10 MG tablet Take 10 mg by mouth daily. 06/03/17  Yes [provider]  magnesium oxide (MAG-OX) 400 (241.3 Mg) MG tablet TAKE 1 TABLET (400 MG TOTAL) BY MOUTH ONCE DAILY. 06/04/17  Yes [provider]  metFORMIN (GLUCOPHAGE) 1000 MG tablet Take 1,000 mg by mouth 2 (two) times daily with a meal.    Yes [provider]  metolazone (ZAROXOLYN) 2.5 MG tablet Take 2.5 mg by mouth 2 (two) times a week. Take 2.5 mg by mouth on Monday and Thursday.   Yes [provider]  metoprolol succinate (TOPROL-XL) 25 MG 24 hr tablet Take 25 mg by mouth daily. 06/23/17  Yes  [provider]  pantoprazole (PROTONIX) 40 MG tablet Take 40 mg by mouth daily.   Yes [provider]  potassium chloride SA (K-DUR,KLOR-CON) 20 MEQ tablet Take 20 mEq by mouth daily. 06/27/17  Yes [provider]  propafenone (RYTHMOL) 225 MG tablet Take 225 mg by mouth every 12 (twelve) hours.   Yes [provider]  rOPINIRole (REQUIP) 4 MG tablet Take 4 mg by mouth at bedtime. 07/02/17  Yes [provider]  senna-docusate (SENNA-PLUS) 8.6-50 MG tablet Take 2 tablets by mouth 2 (two) times daily.    Yes [provider]  spironolactone (ALDACTONE) 50 MG tablet Take 25 mg by mouth daily. At noon   Yes [provider]  torsemide (DEMADEX) 20 MG tablet Take 80 mg by mouth 2 (two) times daily.    Yes [provider]  traZODone (DESYREL) 100 MG tablet Take 200 mg by mouth at bedtime.   Yes [provider]  trimethoprim (TRIMPEX) 100 MG tablet Take 1 tablet (100 mg total) by mouth daily. 11/24/17  Yes MacDiarmid, Nicki Reaper, MD  venlafaxine [facility])  75 MG tablet Take 75 mg by mouth 3 (three) times daily. 06/24/17  Yes [provider]  Vitamin D, Ergocalciferol, (DRISDOL) 50000 units CAPS capsule Take 50,000 Units by mouth every 7 (seven) days. Mondays only   Yes [provider]  warfarin (COUMADIN) 6 MG tablet Take 6 mg by mouth See admin instructions. Patient takes 6 mg on Saturday and Sunday 07/09/17 07/09/18 Yes [provider]  calcium-vitamin D (OSCAL WITH D) 500-200 MG-UNIT per tablet Take 1 tablet by mouth daily.    [provider]  ciprofloxacin (CIPRO) 250 MG tablet Take 1 tablet (250 mg total) by mouth daily. Take 1 tablet the day before and the day of and the day after procedure. Patient not taking: Reported on 01/27/2018 01/05/18   Bjorn Loser, MD  diltiazem (CARDIZEM CD) 180 MG 24 hr capsule Take 180 mg by mouth daily. 06/23/17   [provider]  fesoterodine (TOVIAZ) 8 MG TB24  tablet Take 1 tablet (8 mg total) by mouth daily. Patient not taking: Reported on 01/05/2018 07/21/17   Zara Council A, PA-C  fluticasone (FLONASE) 50 MCG/ACT nasal spray Place 2 sprays into the nose daily.  06/03/17 06/03/18  [provider]  Lancets 30G MISC Use 1 Units as directed. Check CBG's fasting once daily. Dx: E11.9 07/26/15   [provider]  nitrofurantoin (MACRODANTIN) 100 MG capsule Take 1 capsule (100 mg total) by mouth 2 (two) times daily. Patient not taking: Reported on 01/27/2018 01/19/18   Bjorn Loser, MD  oxybutynin (DITROPAN) 5 MG tablet Take 5 mg by mouth 3 (three) times daily.     [provider]  warfarin (COUMADIN) 5 MG tablet Take 5 mg by mouth See admin instructions. Take 5 mg everyday except Saturday and Sunday    [provider]    Allergies Flecainide and Amiodarone  Family History  Problem Relation Age of Onset  . Heart attack Father   . Heart failure Father   . Arthritis Father   . Stroke Father   . Hypertension Father   . Coronary artery disease Brother   . Peripheral vascular disease Brother   . Arthritis Mother   . Colon cancer Mother        colon cancer  . Hypertension Mother   . Cancer Maternal Grandmother        colon cancer  . Arthritis Maternal Grandmother     Social History Social History   Tobacco Use  . Smoking status: Former Smoker    Packs/day: 0.50    Years: 15.00    Pack years: 7.50    Types: Cigarettes  . Smokeless tobacco: Never Used  . Tobacco comment: Has a 20-pack-year history, qutting in 1970.   Substance Use Topics  . Alcohol use: No    Alcohol/week: 0.0 oz  . Drug use: No    Review of Systems  Constitutional: No fever/chills Eyes: No visual changes. ENT: No sore throat. Cardiovascular: Denies chest pain. Respiratory: Denies shortness of breath. Gastrointestinal: No abdominal pain.  No nausea, no vomiting.  No diarrhea.  Genitourinary: Positive for bladder spasms.   Positive for hematuria. Musculoskeletal: Negative for back pain. Skin: Negative for rash. Neurological: Negative for headaches, focal weakness or numbness. ____________________________________________   PHYSICAL EXAM:  VITAL SIGNS: ED Triage Vitals  Enc Vitals Group     BP      Pulse      Resp      Temp      Temp src  SpO2      Weight      Height      Head Circumference      Peak Flow      Pain Score      Pain Loc      Pain Edu?      Excl. in [place]?     Constitutional: Alert and oriented. Well appearing and in no acute distress. Eyes: Conjunctivae are normal. PERRL.  Head: Atraumatic. Nose: No congestion/rhinnorhea. Mouth/Throat: Mucous membranes are dry.  Oropharynx non-erythematous. Neck: No stridor.   Cardiovascular: Normal rate, regular rhythm. Grossly normal heart sounds.  Good peripheral circulation. Respiratory: Normal respiratory effort.  No retractions. Lungs CTAB. Gastrointestinal: Soft and nontender. No distention. No abdominal bruits. No CVA tenderness. Bowel sounds present x 4 quadrants. Genitourinary: Leg bag contains frank hematuria and is nearly completely full.  No clots noted in the bag.  Musculoskeletal: No lower extremity tenderness nor edema.  No joint effusions. Neurologic:  Normal speech and language. No gross focal neurologic deficits are appreciated. No gait instability. Skin:  Skin is warm, dry and intact. No rash noted. Psychiatric: Mood and affect are normal. Speech and behavior are normal.  ____________________________________________   LABS (all labs ordered are listed, but only abnormal results are displayed)  Labs Reviewed  CBC WITH DIFFERENTIAL/PLATELET - Abnormal; Notable for the following components:      Result Value   WBC 12.5 (*)    MCHC 31.3 (*)    RDW 20.1 (*)    Neutro Abs 10.8 (*)    Lymphs Abs 0.4 (*)    Monocytes Absolute 1.1 (*)    All other components within normal limits  COMPREHENSIVE METABOLIC PANEL -  Abnormal; Notable for the following components:   Sodium 134 (*)    Chloride 94 (*)    Glucose, Bld 190 (*)    BUN 33 (*)    Creatinine, Ser 1.17 (*)    Calcium 8.6 (*)    GFR calc non Af Amer 46 (*)    GFR calc Af Amer 53 (*)    All other components within normal limits  PROTIME-INR - Abnormal; Notable for the following components:   Prothrombin Time 30.3 (*)    All other components within normal limits   ____________________________________________  EKG  Not indicated. ____________________________________________  RADIOLOGY  ED MD interpretation:  n/a  Official radiology report(s): No results found.  ____________________________________________   PROCEDURES  Procedure(s) performed:   Procedures  Critical Care performed: No  ____________________________________________   INITIAL IMPRESSION / ASSESSMENT AND PLAN / ED COURSE  As part of my medical decision making, I reviewed the following data within the electronic MEDICAL RECORD NUMBER Notes from prior ED visits    ----------------------------------------- 10:44 PM on 01/26/2018 -----------------------------------------  Case discussed with Dr. Gloriann Loan with urology. He recommends withholding Coumadin tomorrow and to call her urologist in the morning.  ----------------------------------------- 11:00 PM on 01/26/2018 ----------------------------------------- Patient continues to have significant amount of pain despite the morphine given approximately an hour ago.  Pyridium, and Levsin has been requested for bladder spasms.   ----------------------------------------- 11:30 PM on 01/26/2018 ----------------------------------------- No relief with any of the above medications, fentanyl 50 mcg has been requested.  Upon reassessment, it does not appear that the Foley is draining.  Foley catheter was replaced and a small clot was occluding the opening.  Patient states that she has had improvement since the Foley was  replaced.  Immediate return of urine was noted.  Will monitor the  amount of drainage.  RN is to complete bladder scan.  ----------------------------------------- 12:45 AM on 01/27/2018 -----------------------------------------  Only a scant amount of urine has drained from the replaced Foley and the patient continues to have significant pain and bladder spasm.  Three-way catheter and continuous bladder irrigation, then plan for admission.   ____________________________________________   FINAL CLINICAL IMPRESSION(S) / ED DIAGNOSES  Final diagnoses:  Bladder spasm  Gross hematuria     ED Discharge Orders    None       Note:  This document was prepared using Dragon voice recognition software and may include unintentional dictation errors.    Victorino Dike, FNP 01/27/18 Danelle Earthly    Nance Pear, MD 01/27/18 [phone]

## 2018-01-26 NOTE — ED Notes (Signed)
Pt is shifting around in bed, crying, and stating she is hurting very bad. Pt states pain medications have not helped at this time. Pt son states he has concerns with the pt being discharged in current condition.

## 2018-01-26 NOTE — Progress Notes (Signed)
In and Out Catheterization  Patient is present today for a I & O catheterization due to UA. Patient was cleaned and prepped in a sterile fashion with betadine and Lidocaine 2% jelly was instilled into the urethra.  A 14FR cath was inserted no complications were noted , 79ml of urine return was noted, urine was yellow in color. A clean urine sample was collected for UA. Bladder was drained  And catheter was removed with out difficulty.    Preformed by: Fonnie Jarvis, CMA  Bladder Instillation  Due to urinary incontinence patient is present today for a bladder instillation prior to Bladder Botox procedure. Patient was cleaned and prepped in a sterile fashion. 60 ml Lidocaine 2% was instilled into the bladder with a 31ml [place] syringe through the catheter in place.   Preformed by: Fonnie Jarvis, CMA Additional notes/ Follow up: patient rested for 30 min with lidocaine in the bladder prior to starting procedure

## 2018-01-26 NOTE — Progress Notes (Signed)
Bladder Scan: 700 Patient cannot void:  Performed By: Toniann Fail, LPN   Simple Catheter Placement  Due to urinary retention patient is present today for a foley cath placement.  Patient was cleaned and prepped in a sterile fashion with betadine and lidocaine jelly 2% was instilled into the urethra.  A 18 FR foley catheter was inserted, urine return was noted  654ml, urine was dark red in color.  The balloon was filled with 10cc of sterile water.  A leg bag was attached for drainage. Patient was also given a night bag to take home and was given instruction on how to change from one bag to another.  Patient was given instruction on proper catheter care.  Patient tolerated well, no complications were noted   Preformed by: Toniann Fail, LPN

## 2018-01-27 ENCOUNTER — Inpatient Hospital Stay: Payer: Medicare Other

## 2018-01-27 ENCOUNTER — Other Ambulatory Visit: Payer: Self-pay

## 2018-01-27 ENCOUNTER — Encounter: Payer: Self-pay | Admitting: Internal Medicine

## 2018-01-27 DIAGNOSIS — Z952 Presence of prosthetic heart valve: Secondary | ICD-10-CM | POA: Diagnosis not present

## 2018-01-27 DIAGNOSIS — Z90710 Acquired absence of both cervix and uterus: Secondary | ICD-10-CM | POA: Diagnosis not present

## 2018-01-27 DIAGNOSIS — Z87891 Personal history of nicotine dependence: Secondary | ICD-10-CM | POA: Diagnosis not present

## 2018-01-27 DIAGNOSIS — T8089XA Other complications following infusion, transfusion and therapeutic injection, initial encounter: Secondary | ICD-10-CM | POA: Diagnosis present

## 2018-01-27 DIAGNOSIS — E119 Type 2 diabetes mellitus without complications: Secondary | ICD-10-CM | POA: Diagnosis present

## 2018-01-27 DIAGNOSIS — Z7984 Long term (current) use of oral hypoglycemic drugs: Secondary | ICD-10-CM | POA: Diagnosis not present

## 2018-01-27 DIAGNOSIS — E669 Obesity, unspecified: Secondary | ICD-10-CM | POA: Diagnosis present

## 2018-01-27 DIAGNOSIS — R791 Abnormal coagulation profile: Secondary | ICD-10-CM | POA: Diagnosis present

## 2018-01-27 DIAGNOSIS — J9601 Acute respiratory failure with hypoxia: Secondary | ICD-10-CM | POA: Diagnosis not present

## 2018-01-27 DIAGNOSIS — E876 Hypokalemia: Secondary | ICD-10-CM | POA: Diagnosis not present

## 2018-01-27 DIAGNOSIS — Y848 Other medical procedures as the cause of abnormal reaction of the patient, or of later complication, without mention of misadventure at the time of the procedure: Secondary | ICD-10-CM | POA: Diagnosis present

## 2018-01-27 DIAGNOSIS — R310 Gross hematuria: Secondary | ICD-10-CM | POA: Diagnosis present

## 2018-01-27 DIAGNOSIS — J9600 Acute respiratory failure, unspecified whether with hypoxia or hypercapnia: Secondary | ICD-10-CM

## 2018-01-27 DIAGNOSIS — Z7901 Long term (current) use of anticoagulants: Secondary | ICD-10-CM | POA: Diagnosis not present

## 2018-01-27 DIAGNOSIS — I5032 Chronic diastolic (congestive) heart failure: Secondary | ICD-10-CM | POA: Diagnosis not present

## 2018-01-27 DIAGNOSIS — I43 Cardiomyopathy in diseases classified elsewhere: Secondary | ICD-10-CM | POA: Diagnosis present

## 2018-01-27 DIAGNOSIS — R319 Hematuria, unspecified: Secondary | ICD-10-CM | POA: Diagnosis not present

## 2018-01-27 DIAGNOSIS — K219 Gastro-esophageal reflux disease without esophagitis: Secondary | ICD-10-CM | POA: Diagnosis present

## 2018-01-27 DIAGNOSIS — Z7982 Long term (current) use of aspirin: Secondary | ICD-10-CM | POA: Diagnosis not present

## 2018-01-27 DIAGNOSIS — J449 Chronic obstructive pulmonary disease, unspecified: Secondary | ICD-10-CM | POA: Diagnosis present

## 2018-01-27 DIAGNOSIS — J9621 Acute and chronic respiratory failure with hypoxia: Secondary | ICD-10-CM | POA: Diagnosis not present

## 2018-01-27 DIAGNOSIS — G4733 Obstructive sleep apnea (adult) (pediatric): Secondary | ICD-10-CM | POA: Diagnosis present

## 2018-01-27 DIAGNOSIS — I480 Paroxysmal atrial fibrillation: Secondary | ICD-10-CM | POA: Diagnosis present

## 2018-01-27 DIAGNOSIS — Z79899 Other long term (current) drug therapy: Secondary | ICD-10-CM | POA: Diagnosis not present

## 2018-01-27 DIAGNOSIS — Z23 Encounter for immunization: Secondary | ICD-10-CM | POA: Diagnosis present

## 2018-01-27 DIAGNOSIS — Z86711 Personal history of pulmonary embolism: Secondary | ICD-10-CM | POA: Diagnosis not present

## 2018-01-27 DIAGNOSIS — Z7189 Other specified counseling: Secondary | ICD-10-CM | POA: Diagnosis not present

## 2018-01-27 DIAGNOSIS — N3281 Overactive bladder: Secondary | ICD-10-CM | POA: Diagnosis present

## 2018-01-27 DIAGNOSIS — Y92531 Health care provider office as the place of occurrence of the external cause: Secondary | ICD-10-CM | POA: Diagnosis not present

## 2018-01-27 DIAGNOSIS — N3289 Other specified disorders of bladder: Secondary | ICD-10-CM | POA: Diagnosis present

## 2018-01-27 DIAGNOSIS — I5033 Acute on chronic diastolic (congestive) heart failure: Secondary | ICD-10-CM | POA: Diagnosis present

## 2018-01-27 DIAGNOSIS — I482 Chronic atrial fibrillation: Secondary | ICD-10-CM | POA: Diagnosis present

## 2018-01-27 DIAGNOSIS — Z86718 Personal history of other venous thrombosis and embolism: Secondary | ICD-10-CM | POA: Diagnosis not present

## 2018-01-27 LAB — CBC
HCT: 40.8 % (ref 35.0–47.0)
Hemoglobin: 13 g/dL (ref 12.0–16.0)
MCH: 28 pg (ref 26.0–34.0)
MCHC: 31.9 g/dL — AB (ref 32.0–36.0)
MCV: 87.7 fL (ref 80.0–100.0)
PLATELETS: 243 10*3/uL (ref 150–440)
RBC: 4.65 MIL/uL (ref 3.80–5.20)
RDW: 19.7 % — AB (ref 11.5–14.5)
WBC: 10.3 10*3/uL (ref 3.6–11.0)

## 2018-01-27 LAB — HEMOGLOBIN AND HEMATOCRIT, BLOOD
HEMATOCRIT: 35.7 % (ref 35.0–47.0)
Hemoglobin: 11.6 g/dL — ABNORMAL LOW (ref 12.0–16.0)

## 2018-01-27 LAB — BASIC METABOLIC PANEL
Anion gap: 13 (ref 5–15)
BUN: 30 mg/dL — ABNORMAL HIGH (ref 6–20)
CALCIUM: 8.6 mg/dL — AB (ref 8.9–10.3)
CO2: 33 mmol/L — ABNORMAL HIGH (ref 22–32)
CREATININE: 0.87 mg/dL (ref 0.44–1.00)
Chloride: 92 mmol/L — ABNORMAL LOW (ref 101–111)
GLUCOSE: 130 mg/dL — AB (ref 65–99)
Potassium: 3.6 mmol/L (ref 3.5–5.1)
Sodium: 138 mmol/L (ref 135–145)

## 2018-01-27 LAB — GLUCOSE, CAPILLARY
GLUCOSE-CAPILLARY: 120 mg/dL — AB (ref 65–99)
Glucose-Capillary: 107 mg/dL — ABNORMAL HIGH (ref 65–99)
Glucose-Capillary: 163 mg/dL — ABNORMAL HIGH (ref 65–99)
Glucose-Capillary: 134 mg/dL — ABNORMAL HIGH (ref 65–99)

## 2018-01-27 LAB — MRSA PCR SCREENING: MRSA BY PCR: NEGATIVE

## 2018-01-27 MED ORDER — PROPAFENONE HCL 225 MG PO TABS
225.0000 mg | ORAL_TABLET | Freq: Two times a day (BID) | ORAL | Status: DC
  Administered 2018-01-27 – 2018-02-04 (×17): 225 mg via ORAL
  Filled 2018-01-27 (×18): qty 1

## 2018-01-27 MED ORDER — ACETAMINOPHEN 650 MG RE SUPP: 650.0000 mg | Freq: Four times a day (QID) | RECTAL | Status: DC | PRN

## 2018-01-27 MED ORDER — CALCIUM CARBONATE-VITAMIN D 500-200 MG-UNIT PO TABS
1.0000 | ORAL_TABLET | Freq: Every day | ORAL | Status: DC
  Administered 2018-01-27 – 2018-02-04 (×9): 1 via ORAL
  Filled 2018-01-27 (×10): qty 1

## 2018-01-27 MED ORDER — ONDANSETRON HCL 4 MG/2ML IJ SOLN: 4.0000 mg | Freq: Four times a day (QID) | INTRAMUSCULAR | Status: DC | PRN

## 2018-01-27 MED ORDER — FERROUS SULFATE 325 (65 FE) MG PO TABS
325.0000 mg | ORAL_TABLET | Freq: Every day | ORAL | Status: DC
  Administered 2018-01-27 – 2018-02-04 (×9): 325 mg via ORAL
  Filled 2018-01-27 (×9): qty 1

## 2018-01-27 MED ORDER — BELLADONNA ALKALOIDS-OPIUM 16.2-60 MG RE SUPP
1.0000 | Freq: Four times a day (QID) | RECTAL | Status: DC | PRN
  Administered 2018-01-27 (×2): 1 via RECTAL
  Filled 2018-01-27 (×2): qty 1

## 2018-01-27 MED ORDER — DEXTROSE 5 % IV SOLN
2.0000 g | Freq: Three times a day (TID) | INTRAVENOUS | Status: DC
  Administered 2018-01-28 – 2018-01-30 (×8): 2 g via INTRAVENOUS
  Filled 2018-01-27 (×14): qty 2

## 2018-01-27 MED ORDER — MAGNESIUM OXIDE 400 (241.3 MG) MG PO TABS
400.0000 mg | ORAL_TABLET | Freq: Every day | ORAL | Status: DC
  Administered 2018-01-27 – 2018-02-04 (×9): 400 mg via ORAL
  Filled 2018-01-27 (×9): qty 1

## 2018-01-27 MED ORDER — DILTIAZEM HCL ER COATED BEADS 180 MG PO CP24
180.0000 mg | ORAL_CAPSULE | Freq: Every day | ORAL | Status: DC
  Administered 2018-01-27 – 2018-02-04 (×9): 180 mg via ORAL
  Filled 2018-01-27 (×9): qty 1

## 2018-01-27 MED ORDER — BELLADONNA ALKALOIDS-OPIUM 16.2-60 MG RE SUPP
1.0000 | RECTAL | Status: AC
  Administered 2018-01-27: 1 via RECTAL
  Filled 2018-01-27: qty 1

## 2018-01-27 MED ORDER — SODIUM CHLORIDE 0.9 % IV BOLUS (SEPSIS)
250.0000 mL | Freq: Once | INTRAVENOUS | Status: AC
  Administered 2018-01-27: 250 mL via INTRAVENOUS

## 2018-01-27 MED ORDER — FESOTERODINE FUMARATE ER 4 MG PO TB24
8.0000 mg | ORAL_TABLET | Freq: Every day | ORAL | Status: DC
  Administered 2018-01-27 – 2018-02-04 (×9): 8 mg via ORAL
  Filled 2018-01-27 (×9): qty 2

## 2018-01-27 MED ORDER — NOREPINEPHRINE BITARTRATE 1 MG/ML IV SOLN
0.0000 ug/min | INTRAVENOUS | Status: DC
  Administered 2018-01-27: 5 ug/min via INTRAVENOUS
  Filled 2018-01-27: qty 4

## 2018-01-27 MED ORDER — ONDANSETRON HCL 4 MG PO TABS: 4.0000 mg | ORAL_TABLET | Freq: Four times a day (QID) | ORAL | Status: DC | PRN

## 2018-01-27 MED ORDER — METOPROLOL SUCCINATE ER 50 MG PO TB24
25.0000 mg | ORAL_TABLET | Freq: Every day | ORAL | Status: DC
  Administered 2018-01-27: 25 mg via ORAL
  Filled 2018-01-27: qty 1

## 2018-01-27 MED ORDER — PANTOPRAZOLE SODIUM 40 MG PO TBEC
40.0000 mg | DELAYED_RELEASE_TABLET | Freq: Every day | ORAL | Status: DC
  Administered 2018-01-27 – 2018-02-04 (×9): 40 mg via ORAL
  Filled 2018-01-27 (×9): qty 1

## 2018-01-27 MED ORDER — GABAPENTIN 300 MG PO CAPS
600.0000 mg | ORAL_CAPSULE | Freq: Two times a day (BID) | ORAL | Status: DC
  Administered 2018-01-27 – 2018-02-04 (×17): 600 mg via ORAL
  Filled 2018-01-27 (×17): qty 2

## 2018-01-27 MED ORDER — MORPHINE SULFATE (PF) 2 MG/ML IV SOLN
2.0000 mg | Freq: Once | INTRAVENOUS | Status: DC
  Filled 2018-01-27: qty 1

## 2018-01-27 MED ORDER — ROPINIROLE HCL 1 MG PO TABS
4.0000 mg | ORAL_TABLET | Freq: Every day | ORAL | Status: DC
  Administered 2018-01-27 – 2018-02-03 (×8): 4 mg via ORAL
  Filled 2018-01-27 (×8): qty 4

## 2018-01-27 MED ORDER — DIPHENHYDRAMINE HCL 25 MG PO CAPS
25.0000 mg | ORAL_CAPSULE | Freq: Once | ORAL | Status: AC
  Administered 2018-01-27: 25 mg via ORAL
  Filled 2018-01-27: qty 1

## 2018-01-27 MED ORDER — SENNOSIDES-DOCUSATE SODIUM 8.6-50 MG PO TABS
2.0000 | ORAL_TABLET | Freq: Two times a day (BID) | ORAL | Status: DC
  Administered 2018-01-27 – 2018-02-04 (×15): 2 via ORAL
  Filled 2018-01-27 (×17): qty 2

## 2018-01-27 MED ORDER — SODIUM CHLORIDE 0.9 % IV SOLN
INTRAVENOUS | Status: DC
  Administered 2018-01-27 – 2018-01-28 (×4): via INTRAVENOUS

## 2018-01-27 MED ORDER — VITAMIN D (ERGOCALCIFEROL) 1.25 MG (50000 UNIT) PO CAPS
50000.0000 [IU] | ORAL_CAPSULE | ORAL | Status: DC
  Administered 2018-02-02: 50000 [IU] via ORAL
  Filled 2018-01-27: qty 1

## 2018-01-27 MED ORDER — POTASSIUM CHLORIDE CRYS ER 20 MEQ PO TBCR
20.0000 meq | EXTENDED_RELEASE_TABLET | Freq: Every day | ORAL | Status: DC
  Administered 2018-01-27 – 2018-02-04 (×8): 20 meq via ORAL
  Filled 2018-01-27 (×8): qty 1

## 2018-01-27 MED ORDER — VENLAFAXINE HCL 37.5 MG PO TABS
75.0000 mg | ORAL_TABLET | Freq: Three times a day (TID) | ORAL | Status: DC
  Administered 2018-01-27 – 2018-02-04 (×22): 75 mg via ORAL
  Filled 2018-01-27 (×3): qty 2
  Filled 2018-01-27: qty 1
  Filled 2018-01-27 (×21): qty 2

## 2018-01-27 MED ORDER — LORATADINE 10 MG PO TABS
10.0000 mg | ORAL_TABLET | Freq: Every day | ORAL | Status: DC
  Administered 2018-01-27 – 2018-02-04 (×9): 10 mg via ORAL
  Filled 2018-01-27 (×9): qty 1

## 2018-01-27 MED ORDER — AZATHIOPRINE 50 MG PO TABS
100.0000 mg | ORAL_TABLET | Freq: Every day | ORAL | Status: DC
Start: 2018-01-27 — End: 2018-02-04
  Administered 2018-01-27 – 2018-02-04 (×9): 100 mg via ORAL
  Filled 2018-01-27 (×9): qty 2

## 2018-01-27 MED ORDER — ACETAMINOPHEN 325 MG PO TABS
650.0000 mg | ORAL_TABLET | Freq: Four times a day (QID) | ORAL | Status: DC | PRN
  Administered 2018-01-31 – 2018-02-04 (×5): 650 mg via ORAL
  Filled 2018-01-27 (×6): qty 2

## 2018-01-27 MED ORDER — METOLAZONE 2.5 MG PO TABS
2.5000 mg | ORAL_TABLET | ORAL | Status: DC
  Administered 2018-01-29 – 2018-02-02 (×2): 2.5 mg via ORAL
  Filled 2018-01-27 (×2): qty 1

## 2018-01-27 MED ORDER — INSULIN ASPART 100 UNIT/ML ~~LOC~~ SOLN
0.0000 [IU] | Freq: Three times a day (TID) | SUBCUTANEOUS | Status: DC
  Administered 2018-01-27 – 2018-01-28 (×2): 1 [IU] via SUBCUTANEOUS
  Administered 2018-01-29: 3 [IU] via SUBCUTANEOUS
  Administered 2018-01-29: 2 [IU] via SUBCUTANEOUS
  Administered 2018-01-30: 1 [IU] via SUBCUTANEOUS
  Administered 2018-01-30: 2 [IU] via SUBCUTANEOUS
  Administered 2018-01-31 – 2018-02-02 (×4): 1 [IU] via SUBCUTANEOUS
  Administered 2018-02-02 – 2018-02-03 (×2): 2 [IU] via SUBCUTANEOUS
  Administered 2018-02-04: 1 [IU] via SUBCUTANEOUS
  Filled 2018-01-27 (×14): qty 1

## 2018-01-27 MED ORDER — OXYBUTYNIN CHLORIDE 5 MG PO TABS
5.0000 mg | ORAL_TABLET | Freq: Three times a day (TID) | ORAL | Status: DC
  Administered 2018-01-27: 5 mg via ORAL
  Filled 2018-01-27 (×3): qty 1

## 2018-01-27 MED ORDER — FLUTICASONE PROPIONATE 50 MCG/ACT NA SUSP
2.0000 | Freq: Every day | NASAL | Status: DC
  Administered 2018-01-28 – 2018-02-04 (×7): 2 via NASAL
  Filled 2018-01-27: qty 16

## 2018-01-27 MED ORDER — ATORVASTATIN CALCIUM 20 MG PO TABS
20.0000 mg | ORAL_TABLET | Freq: Every day | ORAL | Status: DC
  Administered 2018-01-27 – 2018-02-04 (×9): 20 mg via ORAL
  Filled 2018-01-27 (×9): qty 1

## 2018-01-27 MED ORDER — METFORMIN HCL 500 MG PO TABS
1000.0000 mg | ORAL_TABLET | Freq: Two times a day (BID) | ORAL | Status: DC
  Administered 2018-01-27 – 2018-02-04 (×15): 1000 mg via ORAL
  Filled 2018-01-27 (×16): qty 2

## 2018-01-27 MED ORDER — PHYTONADIONE 5 MG PO TABS
5.0000 mg | ORAL_TABLET | Freq: Once | ORAL | Status: AC
  Administered 2018-01-27: 5 mg via ORAL
  Filled 2018-01-27: qty 1

## 2018-01-27 MED ORDER — MORPHINE SULFATE (PF) 2 MG/ML IV SOLN
2.0000 mg | INTRAVENOUS | Status: DC | PRN
  Administered 2018-01-27 – 2018-02-01 (×7): 2 mg via INTRAVENOUS
  Filled 2018-01-27 (×6): qty 1

## 2018-01-27 NOTE — H&P (Signed)
[facility] [place] NAME: Betty [name]    MR#:  [id number]  DATE OF BIRTH:  Sep 07, 1946  DATE OF ADMISSION:  01/26/2018  PRIMARY CARE PHYSICIAN: Dion Body, MD   REQUESTING/REFERRING PHYSICIAN:   CHIEF COMPLAINT:   Chief Complaint  Patient presents with  . Hematuria    HISTORY OF PRESENT ILLNESS: Betty [name]  is a 72 y.o. female with a known history of diastolic heart failure, atrial fibrillation, COPD, diabetes mellitus, GERD, hypertension, osteoarthritis presented to the emergency room with blood in the urine.  Patient had a Botox injection to the bladder for overactive bladder yesterday.  After going home patient has noticed blood clots in the urine.  Patient also has pain in the lower abdomen which is aching in nature.  Pain is 5 out of 10 on a scale of 1-10.  Case was discussed with urology on-call by ER physician.  Patient was started on continuous bladder irrigation.  Patient is on Coumadin for anticoagulation as outpatient and INR is therapeutic.  PAST MEDICAL HISTORY:   Past Medical History:  Diagnosis Date  . Acute diastolic heart failure ([place])   . Allergy   . 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 Asthma   . Atrial fibrillation ([place])   . Backache, unspecified   . Cardiomegaly   . COPD (chronic obstructive pulmonary disease) ([place])   . Diabetes mellitus without complication ([place])   . 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 Esophageal reflux   . Headache(784.0)   . Herpes zoster without mention of complication   . Hx: UTI (urinary tract infection)   . Hypertension    heart controlled w CHF  . Nontoxic uninodular goiter   . Obesity, unspecified   . Osteoarthrosis, unspecified whether generalized or localized, unspecified site   . Unspecified sleep apnea   . Urine incontinence    hx of    PAST SURGICAL HISTORY:  Past Surgical History:  Procedure Laterality Date  . ABDOMINAL  HYSTERECTOMY  1979   complete (for precancerous cells)  . ABLATION  2011 & 2014  . bladder botox  01/26/2018  . CHOLECYSTECTOMY    . Interstim Placement  2012  . OOPHORECTOMY      SOCIAL HISTORY:  Social History   Tobacco Use  . Smoking status: Former Smoker    Packs/day: 0.50    Years: 15.00    Pack years: 7.50    Types: Cigarettes  . Smokeless tobacco: Never Used  . Tobacco comment: Has a 20-pack-year history, qutting in 1970.   Substance Use Topics  . Alcohol use: No    Alcohol/week: 0.0 oz    FAMILY HISTORY:  Family History  Problem Relation Age of Onset  . Heart attack Father   . Heart failure Father   . Arthritis Father   . Stroke Father   . Hypertension Father   . Coronary artery disease Brother   . Peripheral vascular disease Brother   . Arthritis Mother   . Colon cancer Mother        colon cancer  . Hypertension Mother   . Cancer Maternal Grandmother        colon cancer  . Arthritis Maternal Grandmother     DRUG ALLERGIES:  Allergies  Allergen Reactions  . Flecainide Shortness Of Breath and Other (See Comments)    Reaction: dizziness   . Amiodarone Other (See Comments)    Pt states that this  medication causes lung bleeding.      REVIEW OF SYSTEMS:   CONSTITUTIONAL: No fever, fatigue or weakness.  EYES: No blurred or double vision.  EARS, NOSE, AND THROAT: No tinnitus or ear pain.  RESPIRATORY: No cough, shortness of breath, wheezing or hemoptysis.  CARDIOVASCULAR: No chest pain, orthopnea, edema.  GASTROINTESTINAL: No nausea, vomiting, diarrhea  Has lower abdominal pain.  GENITOURINARY: Has dysuria, hematuria.  ENDOCRINE: No polyuria, nocturia,  HEMATOLOGY: No anemia, easy bruising or bleeding SKIN: No rash or lesion. MUSCULOSKELETAL: No joint pain or arthritis.   NEUROLOGIC: No tingling, numbness, weakness.  PSYCHIATRY: No anxiety or depression.   MEDICATIONS AT HOME:  Prior to Admission medications   Medication Sig Start Date End Date  Taking? Authorizing Provider  albuterol (PROVENTIL HFA;VENTOLIN HFA) 108 (90 Base) MCG/ACT inhaler Inhale 2 puffs into the lungs every 4 (four) hours as needed for wheezing or shortness of breath. 01/17/16  Yes Kasa, Maretta Bees, MD  albuterol (PROVENTIL) (2.5 MG/3ML) 0.083% nebulizer solution Take 3 mLs (2.5 mg total) by nebulization every 4 (four) hours as needed for wheezing or shortness of breath. 06/28/16  Yes Gladstone Lighter, MD  aspirin EC 81 MG tablet Take 81 mg by mouth daily.   Yes [provider]  atorvastatin (LIPITOR) 20 MG tablet Take 20 mg by mouth daily.   Yes [provider]  azaTHIOprine (IMURAN) 50 MG tablet Take 100 mg by mouth daily.    Yes [provider]  ferrous sulfate 325 (65 FE) MG EC tablet Take 325 mg by mouth daily.  06/23/17  Yes [provider]  gabapentin (NEURONTIN) 600 MG tablet Take 600 mg by mouth 2 (two) times daily.   Yes [provider]  loratadine (CLARITIN) 10 MG tablet Take 10 mg by mouth daily. 06/03/17  Yes [provider]  magnesium oxide (MAG-OX) 400 (241.3 Mg) MG tablet TAKE 1 TABLET (400 MG TOTAL) BY MOUTH ONCE DAILY. 06/04/17  Yes [provider]  metFORMIN (GLUCOPHAGE) 1000 MG tablet Take 1,000 mg by mouth 2 (two) times daily with a meal.    Yes [provider]  metolazone (ZAROXOLYN) 2.5 MG tablet Take 2.5 mg by mouth 2 (two) times a week. Take 2.5 mg by mouth on Monday and Thursday.   Yes [provider]  metoprolol succinate (TOPROL-XL) 25 MG 24 hr tablet Take 25 mg by mouth daily. 06/23/17  Yes [provider]  pantoprazole (PROTONIX) 40 MG tablet Take 40 mg by mouth daily.   Yes [provider]  potassium chloride SA (K-DUR,KLOR-CON) 20 MEQ tablet Take 20 mEq by mouth daily. 06/27/17  Yes [provider]  propafenone (RYTHMOL) 225 MG tablet Take 225 mg by mouth every 12 (twelve) hours.   Yes [provider]  rOPINIRole (REQUIP) 4 MG tablet  Take 4 mg by mouth at bedtime. 07/02/17  Yes [provider]  senna-docusate (SENNA-PLUS) 8.6-50 MG tablet Take 2 tablets by mouth 2 (two) times daily.    Yes [provider]  spironolactone (ALDACTONE) 50 MG tablet Take 25 mg by mouth daily. At noon   Yes [provider]  torsemide (DEMADEX) 20 MG tablet Take 80 mg by mouth 2 (two) times daily.    Yes [provider]  traZODone (DESYREL) 100 MG tablet Take 200 mg by mouth at bedtime.   Yes [provider]  trimethoprim (TRIMPEX) 100 MG tablet Take 1 tablet (100 mg total) by mouth daily. 11/24/17  Yes MacDiarmid, Nicki Reaper, MD  venlafaxine (  EFFEXOR) 75 MG tablet Take 75 mg by mouth 3 (three) times daily. 06/24/17  Yes [provider]  Vitamin D, Ergocalciferol, (DRISDOL) 50000 units CAPS capsule Take 50,000 Units by mouth every 7 (seven) days. Mondays only   Yes [provider]  warfarin (COUMADIN) 6 MG tablet Take 6 mg by mouth See admin instructions. Patient takes 6 mg on Saturday and Sunday 07/09/17 07/09/18 Yes [provider]  calcium-vitamin D (OSCAL WITH D) 500-200 MG-UNIT per tablet Take 1 tablet by mouth daily.    [provider]  ciprofloxacin (CIPRO) 250 MG tablet Take 1 tablet (250 mg total) by mouth daily. Take 1 tablet the day before and the day of and the day after procedure. Patient not taking: Reported on 01/27/2018 01/05/18   Bjorn Loser, MD  diltiazem (CARDIZEM CD) 180 MG 24 hr capsule Take 180 mg by mouth daily. 06/23/17   [provider]  fesoterodine (TOVIAZ) 8 MG TB24 tablet Take 1 tablet (8 mg total) by mouth daily. Patient not taking: Reported on 01/05/2018 07/21/17   Zara Council A, PA-C  fluticasone (FLONASE) 50 MCG/ACT nasal spray Place 2 sprays into the nose daily.  06/03/17 06/03/18  [provider]  Lancets 30G MISC Use 1 Units as directed. Check CBG's fasting once daily. Dx: E11.9 07/26/15   [provider]  nitrofurantoin  (MACRODANTIN) 100 MG capsule Take 1 capsule (100 mg total) by mouth 2 (two) times daily. Patient not taking: Reported on 01/27/2018 01/19/18   Bjorn Loser, MD  oxybutynin (DITROPAN) 5 MG tablet Take 5 mg by mouth 3 (three) times daily.     [provider]  warfarin (COUMADIN) 5 MG tablet Take 5 mg by mouth See admin instructions. Take 5 mg everyday except Saturday and Sunday    [provider]      PHYSICAL EXAMINATION:   VITAL SIGNS: Blood pressure 112/71, pulse 79, temperature 99.3 F (37.4 C), temperature source Oral, resp. rate (!) 22, height 5\' 9"  (1.753 m), weight 104.3 kg (230 lb), SpO2 95 %.  GENERAL:  72 y.o.-year-old patient lying in the bed with no acute distress.  EYES: Pupils equal, round, reactive to light and accommodation. No scleral icterus. Extraocular muscles intact.  HEENT: Head atraumatic, normocephalic. Oropharynx and nasopharynx clear.  NECK:  Supple, no jugular venous distention. No thyroid enlargement, no tenderness.  LUNGS: Normal breath sounds bilaterally, no wheezing, rales,rhonchi or crepitation. No use of accessory muscles of respiration.  CARDIOVASCULAR: S1, S2 normal. No murmurs, rubs, or gallops.  ABDOMEN: Soft, tenderness in the pubic area, nondistended. Bowel sounds present. No organomegaly or mass.  EXTREMITIES: No pedal edema, cyanosis, or clubbing.  NEUROLOGIC: Cranial nerves II through XII are intact. Muscle strength 5/5 in all extremities. Sensation intact. Gait not checked.  PSYCHIATRIC: The patient is alert and oriented x 3.  SKIN: No obvious rash, lesion, or ulcer.   LABORATORY PANEL:   CBC Recent Labs  Lab 01/26/18 2157  WBC 12.5*  HGB 13.0  HCT 41.5  PLT 257  MCV 88.2  MCH 27.6  MCHC 31.3*  RDW 20.1*  LYMPHSABS 0.4*  MONOABS 1.1*  EOSABS 0.2  BASOSABS 0.0   ------------------------------------------------------------------------------------------------------------------  Chemistries  Recent Labs  Lab  01/26/18 2157  NA 134*  K 4.1  CL 94*  CO2 29  GLUCOSE 190*  BUN 33*  CREATININE 1.17*  CALCIUM 8.6*  AST 39  ALT 17  ALKPHOS 96  BILITOT 0.8   ------------------------------------------------------------------------------------------------------------------ estimated creatinine clearance is 56.7  mL/min (A) (by C-G formula based on SCr of 1.17 mg/dL (H)). ------------------------------------------------------------------------------------------------------------------ No results for input(s): TSH, T4TOTAL, T3FREE, THYROIDAB in the last 72 hours.  Invalid input(s): FREET3   Coagulation profile Recent Labs  Lab 01/26/18 2157  INR 2.93   ------------------------------------------------------------------------------------------------------------------- No results for input(s): DDIMER in the last 72 hours. -------------------------------------------------------------------------------------------------------------------  Cardiac Enzymes No results for input(s): CKMB, TROPONINI, MYOGLOBIN in the last 168 hours.  Invalid input(s): CK ------------------------------------------------------------------------------------------------------------------ Invalid input(s): POCBNP  ---------------------------------------------------------------------------------------------------------------  Urinalysis    Component Value Date/Time   COLORURINE YELLOW (A) 07/19/2017 2305   APPEARANCEUR Clear 01/26/2018 0845   LABSPEC 1.006 07/19/2017 2305   PHURINE 6.0 07/19/2017 2305   GLUCOSEU Negative 01/26/2018 0845   GLUCOSEU NEGATIVE 05/03/2014 0931   HGBUR SMALL (A) 07/19/2017 2305   BILIRUBINUR Negative 01/26/2018 0845   KETONESUR NEGATIVE 07/19/2017 2305   PROTEINUR Negative 01/26/2018 0845   PROTEINUR NEGATIVE 07/19/2017 2305   UROBILINOGEN 1.0 01/20/2015 1500   UROBILINOGEN 0.2 05/03/2014 0931   NITRITE Negative 01/26/2018 0845   NITRITE NEGATIVE 07/19/2017 2305   LEUKOCYTESUR  Trace (A) 01/26/2018 0845     RADIOLOGY: No results found.  EKG: Orders placed or performed during the hospital encounter of 07/19/17  . EKG 12-Lead  . EKG 12-Lead    IMPRESSION AND PLAN: 72 year old female patient with history of diastolic heart failure, atrial fibrillation, COPD, diabetes mellitus, GERD, hypertension presented to the emergency room with blood in the urine after Botox injection to the bladder.  Admitting diagnosis 1.  Hematuria 2.  Emphysema 3.  Diastolic heart failure 4.  Type 2 diabetes mellitus Treatment plan Admit patient to medical floor Hold Coumadin Continuous bladder irrigation  monitor hemoglobin hematocrit Urology consult   All the records are reviewed and case discussed with ED provider. Management plans discussed with the patient, family and they are in agreement.  CODE STATUS:FULL CODE Code Status History    Date Active Date Inactive Code Status Order ID Comments User Context   07/20/2017 04:37 07/20/2017 19:40 Full Code [id number]  Harrie Foreman, MD Inpatient   05/25/2017 19:51 05/27/2017 17:34 Full Code [id number]  Gladstone Lighter, MD Inpatient   03/27/2017 22:51 04/09/2017 15:49 Full Code [id number]  Lance Coon, MD Inpatient   06/26/2016 01:06 06/28/2016 16:39 Full Code [id number]  Lance Coon, MD Inpatient   05/10/2016 16:38 05/12/2016 16:59 Full Code [id number]  Theodoro Grist, MD ED   04/01/2016 17:13 04/03/2016 05:58 Full Code [id number]  Henreitta Leber, MD Inpatient       TOTAL TIME TAKING CARE OF THIS PATIENT: 54 minutes.    Saundra Shelling M.D on 01/27/2018 at 3:34 AM  Between 7am to 6pm - Pager - [phone]  After 6pm go to [email or web address] - password [email or web address] [facility]  Office  [phone]  CC: Primary care physician; Dion Body, MD

## 2018-01-27 NOTE — Consult Note (Signed)
Name: VERSA CRATON MRN: [id number] DOB: Aug 15, 1946    ADMISSION DATE:  01/26/2018 CONSULTATION DATE: 01/27/2018  REFERRING MD : Dr. Anselm Jungling   CHIEF COMPLAINT: Gross Hematuria/Bladder Spasms   BRIEF PATIENT DESCRIPTION:  72 yo female admitted s/p botox injection to the bladder in the setting of anticoagulation required transfer to stepdown unit for continuous bladder irrigation   SIGNIFICANT EVENTS  02/4-Pt admitted to medsurg unit  02/5-Pt transferred to stepdown unit   STUDIES:  None   HISTORY OF PRESENT ILLNESS:   This is a 72 yo female with a PMH of Urine Incontinence, OSA, ANCA Associated Vasculitis, Obesity, Mechanical Aortic Valve Replacement, Roving Eye Movements, HTN, Nontoxic Uni nodular Goiter, GERD, Diabetes Mellitus, Pulmonary Alveolar Hemorrhage, COPD, Interstitial Lung Disease, prn Home O2, Cardiomegaly, Atrial Fibrillation, Asthma, Allergies, and Acute Diastolic CHF.  She presented to [facility] ER 02/4 with c/o blood in the urine, bladder spasms, and abdominal discomfort. On 01/26/18 she underwent intravesical Botox injections with foley catheter placement in the outpatient setting at [facility] Urology office due to overactive bladder hx and sent home post procedure. In the ER urology consulted and assessed pts foley catheter which was draining initially, therefore plans were to discharge pt home.  However, her catheter became obstructed and was exchanged several times.  A 22 [place] three-way was placed and CBI initiated.  She was subsequently admitted to the [facility] unit by hospitalist team for further workup and treatment. She required transfer to the stepdown unit 02/5 for a higher level of care.    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  has a past surgical history that includes Oophorectomy; Cholecystectomy; Abdominal hysterectomy (1979); Ablation (2011 & 2014); Interstim Placement (2012); and bladder botox (01/26/2018). Prior to Admission medications   Medication Sig Start Date End Date Taking? Authorizing Provider  albuterol (PROVENTIL HFA;VENTOLIN HFA) 108 (90 Base) MCG/ACT inhaler Inhale 2 puffs into the lungs every 4 (four) hours as needed for wheezing or shortness of breath. 01/17/16  Yes Kasa, Maretta Bees, MD  albuterol (PROVENTIL) (2.5 MG/3ML) 0.083% nebulizer solution Take 3 mLs (2.5 mg total) by nebulization every 4 (four) hours as needed for wheezing or shortness of breath. 06/28/16  Yes Gladstone Lighter, MD  aspirin EC 81 MG tablet Take 81 mg by mouth daily.   Yes [provider]  atorvastatin (LIPITOR) 20 MG tablet Take 20 mg by mouth daily.   Yes [provider]  azaTHIOprine (IMURAN) 50 MG tablet Take 100 mg by mouth daily.    Yes [provider]  ferrous sulfate 325 (65 FE) MG EC tablet Take 325 mg by mouth daily.  06/23/17  Yes [provider]  gabapentin (NEURONTIN) 600 MG tablet Take 600 mg by mouth 2 (two) times daily.   Yes [provider]  loratadine (CLARITIN) 10 MG tablet Take 10 mg by mouth daily. 06/03/17  Yes [provider]  magnesium oxide (MAG-OX) 400 (241.3 Mg) MG tablet TAKE 1 TABLET (400 MG TOTAL) BY MOUTH ONCE DAILY. 06/04/17  Yes [provider]  metFORMIN (GLUCOPHAGE) 1000 MG tablet Take 1,000 mg by  mouth 2 (two) times daily with a meal.    Yes [provider]    metolazone (ZAROXOLYN) 2.5 MG tablet Take 2.5 mg by mouth 2 (two) times a week. Take 2.5 mg by mouth on Monday and Thursday.   Yes [provider]  metoprolol succinate (TOPROL-XL) 25 MG 24 hr tablet Take 25 mg by mouth daily. 06/23/17  Yes [provider]  pantoprazole (PROTONIX) 40 MG tablet Take 40 mg by mouth daily.   Yes [provider]  potassium chloride SA (K-DUR,KLOR-CON) 20 MEQ tablet Take 20 mEq by mouth daily. 06/27/17  Yes [provider]  propafenone (RYTHMOL) 225 MG tablet Take 225 mg by mouth every 12 (twelve) hours.   Yes [provider]  rOPINIRole (REQUIP) 4 MG tablet Take 4 mg by mouth at bedtime. 07/02/17  Yes [provider]  senna-docusate (SENNA-PLUS) 8.6-50 MG tablet Take 2 tablets by mouth 2 (two) times daily.    Yes [provider]  spironolactone (ALDACTONE) 50 MG tablet Take 25 mg by mouth daily. At noon   Yes [provider]  torsemide (DEMADEX) 20 MG tablet Take 80 mg by mouth 2 (two) times daily.    Yes [provider]  traZODone (DESYREL) 100 MG tablet Take 200 mg by mouth at bedtime.   Yes [provider]  trimethoprim (TRIMPEX) 100 MG tablet Take 1 tablet (100 mg total) by mouth daily. 11/24/17  Yes MacDiarmid, Nicki Reaper, MD  venlafaxine (EFFEXOR) 75 MG tablet Take 75 mg by mouth 3 (three) times daily. 06/24/17  Yes [provider]  Vitamin D, Ergocalciferol, (DRISDOL) 50000 units CAPS capsule Take 50,000 Units by mouth every 7 (seven) days. Mondays only   Yes [provider]  warfarin (COUMADIN) 6 MG tablet Take 6 mg by mouth See admin instructions. Patient takes 6 mg on Saturday and Sunday 07/09/17 07/09/18 Yes [provider]  calcium-vitamin D (OSCAL WITH D) 500-200 MG-UNIT per tablet Take 1 tablet by mouth daily.    [provider]  ciprofloxacin (CIPRO) 250 MG tablet Take 1 tablet (250 mg total) by mouth daily. Take 1 tablet the day before and the  day of and the day after procedure. Patient not taking: Reported on 01/27/2018 01/05/18   Bjorn Loser, MD  diltiazem (CARDIZEM CD) 180 MG 24 hr capsule Take 180 mg by mouth daily. 06/23/17   [provider]  fesoterodine (TOVIAZ) 8 MG TB24 tablet Take 1 tablet (8 mg total) by mouth daily. Patient not taking: Reported on 01/05/2018 07/21/17   Zara Council A, PA-C  fluticasone (FLONASE) 50 MCG/ACT nasal spray Place 2 sprays into the nose daily.  06/03/17 06/03/18  [provider]  Lancets 30G MISC Use 1 Units as directed. Check CBG's fasting once daily. Dx: E11.9 07/26/15   [provider]  nitrofurantoin (MACRODANTIN) 100 MG capsule Take 1 capsule (100 mg total) by mouth 2 (two) times daily. Patient not taking: Reported on 01/27/2018 01/19/18   Bjorn Loser, MD  oxybutynin (DITROPAN) 5 MG tablet Take 5 mg by mouth 3 (three) times daily.     [provider]  warfarin (COUMADIN) 5 MG tablet Take 5 mg by mouth See admin instructions. Take 5 mg everyday except Saturday and Sunday    [provider]   Allergies  Allergen Reactions  . Flecainide Shortness Of Breath and Other (See Comments)    Reaction: dizziness   . Amiodarone Other (See Comments)    Pt states that this medication  causes lung bleeding.      FAMILY HISTORY:  family history includes Arthritis in her father, maternal grandmother, and mother; Cancer in her maternal grandmother; Colon cancer in her mother; Coronary artery disease in her brother; Heart attack in her father; Heart failure in her father; Hypertension in her father and mother; Peripheral vascular disease in her brother; Stroke in her father. SOCIAL HISTORY:  reports that she has quit smoking. Her smoking use included cigarettes. She has a 7.50 pack-year smoking history. she has never used smokeless tobacco. She reports that she does not drink alcohol or use drugs.  REVIEW OF SYSTEMS: Positives in BOLD  Constitutional: Negative  for fever, chills, weight loss, malaise/fatigue and diaphoresis.  HENT: Negative for hearing loss, ear pain, nosebleeds, congestion, sore throat, neck pain, tinnitus and ear discharge.   Eyes: Negative for blurred vision, double vision, photophobia, pain, discharge and redness.  Respiratory: Negative for cough, hemoptysis, sputum production, shortness of breath, wheezing and stridor.   Cardiovascular: Negative for chest pain, palpitations, orthopnea, claudication, leg swelling and PND.  Gastrointestinal: heartburn, nausea, vomiting, abdominal pain, diarrhea, constipation, blood in stool and melena.  Genitourinary: dysuria, urgency, frequency, hematuria and flank pain.  Musculoskeletal: Negative for myalgias, back pain, joint pain and falls.  Skin: Negative for itching and rash.  Neurological: Negative for dizziness, tingling, tremors, sensory change, speech change, focal weakness, seizures, loss of consciousness, weakness and headaches.  Endo/Heme/Allergies: Negative for environmental allergies and polydipsia. Does not bruise/bleed easily.  SUBJECTIVE:  No current complaints, RN concerned pt having intermittent involuntary eye rolling she does have a hx of roving eye movements. She denies taking new medications or antipsychotic medications.   VITAL SIGNS: Temp:  [98.3 F (36.8 C)-99.7 F (37.6 C)] 99.1 F (37.3 C) (02/05 1945) Pulse Rate:  [75-95] 82 (02/05 1800) Resp:  [13-24] 16 (02/05 1800) BP: (100-192)/(52-179) 108/59 (02/05 1820) SpO2:  [68 %-95 %] 95 % (02/05 1820) Weight:  [100.7 kg (222 lb 0.1 oz)-101.1 kg (222 lb 12.8 oz)] 100.7 kg (222 lb 0.1 oz) (02/05 1757)  PHYSICAL EXAMINATION: General: acutely ill appearing female resting in bed, NAD  Neuro: alert and oriented, intermittent involuntary eye rolling, PERRLA, strong bilateral hand grips, sensation intact  HEENT: supple, no JVD  Cardiovascular: vpaced, rrr Lungs: faint crackles throughout, slightly labored Abdomen: +BS x4,  soft, non tender, non distended  Musculoskeletal: normal bulk and tone, no edema  Skin: intact no rashes or lesions  Recent Labs  Lab 01/26/18 2157 01/27/18 0718  NA 134* 138  K 4.1 3.6  CL 94* 92*  CO2 29 33*  BUN 33* 30*  CREATININE 1.17* 0.87  GLUCOSE 190* 130*   Recent Labs  Lab 01/26/18 2157 01/27/18 0718  HGB 13.0 13.0  HCT 41.5 40.8  WBC 12.5* 10.3  PLT 257 243   No results found.  ASSESSMENT / PLAN: S/P Botox Injection to the bladder secondary to overactive bladder (performed 01/26/18 in outpatient setting) Acute on chronic hypoxic respiratory failure secondary to possible pneumonia and ILD Hypotension secondary to hypovolemia and possible sepsis  Hx: COPD, Cardiomegaly, OSA, Obesity, DVT, PE (pt on coumadin), Paroxysmal Atrial Fibrillation, Interstitial Lung Disease, and Acute Diastolic CHF  P: Supplemental O2 for dyspnea and/or hypoxia  CPAP qhs  Stat CXR  Prn levophed gtt to maintain map >65 NS @75  ml/hr  Urology consulted appreciate input-continue CBI per recommendations, will reassess in the am for possible clot evacuation and fulguration   Continue belladonna and opium suppositories for bladder spasms  Keep NPO after midnight  Trend CBC Monitor for s/sx of bleeding and Transfuse for hgb <7 Monitor coags  Trend WBC and monitor fever curve Trend PCT if elevated will start abx Trend BMP  Replace electrolytes as indicated  Monitor UOP   Marda Stalker, [place] Pager [phone] (please enter 7 digits) PCCM Consult Pager [phone] (please enter 7 digits)

## 2018-01-27 NOTE — ED Notes (Addendum)
3L bag of continuous foley irrigation fluid complete and 3350 cc emptied from foley bag. Nursing supervisor notified of the need for a new bag of fluid.

## 2018-01-27 NOTE — Consult Note (Signed)
Urology Consult  I have been asked to see the patient by Dr. Anselm Jungling, for evaluation and management of gross hematuria.  Chief Complaint: gross hematuria/ bladder spasms  History of Present Illness: Betty [name] is a 72 y.o. year old female with overactive bladder who underwent intravesical Botox injections in the office yesterday with Dr. Matilde Sprang.  The injections were initially uncomplicated with very little bleeding and she was sent home.  She returned to our office later in the day complaining of difficulty voiding, clot retention at which time a Foley catheter was placed and her bladder was irrigated until adequately cleared.  She was sent home with an 18 Fr catheter in place and advised to present to the emergency room if her symptoms recurred in the evening.  She ended up calling EMS yesterday evening arriving to the emergency room around 10 PM yesterday with bladder spasms, ongoing hematuria and discomfort.  Urology was contacted that point time at which time her catheter appeared to be draining and the plan was for discharge home.  Unfortunately, her catheter obstructed and was exchanged several times ultimately up to a 22 [place] three-way and CBI was initiated.  She was admitted to the medical service without urology being made aware of this until 1 PM today when called by unit clerk.   Urology was consulted for inpatient evaluation and treatment.  She is been on high flow CBI which has been intermittently obstructing requiring hand irrigation.  She is complaining of bladder spasms, lower abdominal discomfort and has difficulty tolerating hand irrigation.  She does have a history of chronic A. fib on Coumadin.  On admission, her INR was 2.93.  This is since been held and she has been administered vitamin K for reversal.  Her hemoglobin is been stable overnight.    Past Medical History:  Diagnosis Date  . Acute diastolic heart failure ([place])   . Allergy   . 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 Asthma   . Atrial fibrillation ([place])   . Backache, unspecified   . Cardiomegaly   . COPD (chronic obstructive pulmonary disease) ([place])   . Diabetes mellitus without complication ([place])   . 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 Esophageal reflux   . Headache(784.0)   . Herpes zoster without mention of complication   . Hx: UTI (urinary tract infection)   . Hypertension    heart controlled w CHF  . Nontoxic uninodular goiter   . Obesity, unspecified   . Osteoarthrosis, unspecified whether generalized or localized, unspecified site   . Unspecified sleep apnea   . Urine incontinence    hx of    Past Surgical History:  Procedure Laterality Date  . ABDOMINAL HYSTERECTOMY  1979   complete (for precancerous cells)  . ABLATION  2011 & 2014  . bladder botox  01/26/2018  . CHOLECYSTECTOMY    . Interstim Placement  2012  . OOPHORECTOMY      Home Medications:  Current Meds  Medication Sig  . albuterol (PROVENTIL HFA;VENTOLIN HFA) 108 (90 Base) MCG/ACT inhaler Inhale 2 puffs into the lungs every 4 (four) hours as needed for wheezing or shortness of breath.  Marland Kitchen albuterol (PROVENTIL) (2.5 MG/3ML) 0.083% nebulizer solution Take 3 mLs (2.5 mg total) by nebulization every 4 (four) hours as needed for wheezing or shortness of breath.  Marland Kitchen aspirin EC 81 MG tablet Take 81 mg by mouth daily.  Marland Kitchen atorvastatin (LIPITOR) 20 MG tablet  Take 20 mg by mouth daily.  Marland Kitchen azaTHIOprine (IMURAN) 50 MG tablet Take 100 mg by mouth daily.   . ferrous sulfate 325 (65 FE) MG EC tablet Take 325 mg by mouth daily.   Marland Kitchen gabapentin (NEURONTIN) 600 MG tablet Take 600 mg by mouth 2 (two) times daily.  Marland Kitchen loratadine (CLARITIN) 10 MG tablet Take 10 mg by mouth daily.  . magnesium oxide (MAG-OX) 400 (241.3 Mg) MG tablet TAKE 1 TABLET (400 MG TOTAL) BY MOUTH ONCE DAILY.  . metFORMIN (GLUCOPHAGE) 1000 MG tablet Take 1,000 mg by mouth 2 (two) times daily with a meal.   . metolazone  (ZAROXOLYN) 2.5 MG tablet Take 2.5 mg by mouth 2 (two) times a week. Take 2.5 mg by mouth on Monday and Thursday.  . metoprolol succinate (TOPROL-XL) 25 MG 24 hr tablet Take 25 mg by mouth daily.  . pantoprazole (PROTONIX) 40 MG tablet Take 40 mg by mouth daily.  . potassium chloride SA (K-DUR,KLOR-CON) 20 MEQ tablet Take 20 mEq by mouth daily.  . propafenone (RYTHMOL) 225 MG tablet Take 225 mg by mouth every 12 (twelve) hours.  Marland Kitchen rOPINIRole (REQUIP) 4 MG tablet Take 4 mg by mouth at bedtime.  . senna-docusate (SENNA-PLUS) 8.6-50 MG tablet Take 2 tablets by mouth 2 (two) times daily.   Marland Kitchen spironolactone (ALDACTONE) 50 MG tablet Take 25 mg by mouth daily. At noon  . torsemide (DEMADEX) 20 MG tablet Take 80 mg by mouth 2 (two) times daily.   . traZODone (DESYREL) 100 MG tablet Take 200 mg by mouth at bedtime.  Marland Kitchen trimethoprim (TRIMPEX) 100 MG tablet Take 1 tablet (100 mg total) by mouth daily.  Marland Kitchen venlafaxine (EFFEXOR) 75 MG tablet Take 75 mg by mouth 3 (three) times daily.  . Vitamin D, Ergocalciferol, (DRISDOL) 50000 units CAPS capsule Take 50,000 Units by mouth every 7 (seven) days. Mondays only  . warfarin (COUMADIN) 6 MG tablet Take 6 mg by mouth See admin instructions. Patient takes 6 mg on Saturday and Sunday    Allergies:  Allergies  Allergen Reactions  . Flecainide Shortness Of Breath and Other (See Comments)    Reaction: dizziness   . Amiodarone Other (See Comments)    Pt states that this medication causes lung bleeding.      Family History  Problem Relation Age of Onset  . Heart attack Father   . Heart failure Father   . Arthritis Father   . Stroke Father   . Hypertension Father   . Coronary artery disease Brother   . Peripheral vascular disease Brother   . Arthritis Mother   . Colon cancer Mother        colon cancer  . Hypertension Mother   . Cancer Maternal Grandmother        colon cancer  . Arthritis Maternal Grandmother     Social History:  reports that she has  quit smoking. Her smoking use included cigarettes. She has a 7.50 pack-year smoking history. she has never used smokeless tobacco. She reports that she does not drink alcohol or use drugs.  ROS: A complete review of systems was performed.  All systems are negative except for pertinent findings as noted.  Physical Exam:  Vital signs in last 24 hours: Temp:  [98.3 F (36.8 C)-99.7 F (37.6 C)] 99.7 F (37.6 C) (02/05 1437) Pulse Rate:  [75-95] 86 (02/05 1437) Resp:  [13-30] 20 (02/05 1437) BP: (102-192)/(52-179) 106/52 (02/05 1437) SpO2:  [84 %-96 %] 93 % (02/05 1437)  Weight:  [222 lb 12.8 oz (101.1 kg)-230 lb (104.3 kg)] 222 lb 12.8 oz (101.1 kg) (02/05 0500) Constitutional:  Alert and oriented, No acute distress HEENT: [place] AT, moist mucus membranes.  Trachea midline, no masses Cardiovascular: Regular rate and rhythm, no clubbing, cyanosis, or edema. Respiratory: Normal respiratory effort, no increased work of breathing. GI: Abdomen is soft, nontender, nondistended, no abdominal masses GU: 18 French Foley catheter in place draining cherry colored urine with occasional clot. Skin: No rashes, bruises or suspicious lesions Neurologic: Grossly intact, no focal deficits, moving all 4 extremities Psychiatric: Somewhat agitated.  Procedure: Catheter was hand irrigated with 500 cc of sterile saline.  Unfortunately, the patient did not tolerate this well and had significant bladder spasms throughout the irrigation procedure.  She did receive 2 mg of IV morphine as well as a belladonna and opium suppository around the time of irrigation.  Approximately 100 cc of clot was evacuated from the bladder which appeared old in nature.  The urine still remained cranberry after no further clot could be removed.  She is placed back on a brisk CBI drip.  Laboratory Data:  Recent Labs    01/26/18 2157 01/27/18 0718  WBC 12.5* 10.3  HGB 13.0 13.0  HCT 41.5 40.8   Recent Labs    01/26/18 2157  01/27/18 0718  NA 134* 138  K 4.1 3.6  CL 94* 92*  CO2 29 33*  GLUCOSE 190* 130*  BUN 33* 30*  CREATININE 1.17* 0.87  CALCIUM 8.6* 8.6*   Recent Labs    01/26/18 2157  INR 2.93   No results for input(s): LABURIN in the last 72 hours. Results for orders placed or performed in visit on 01/26/18  Microscopic Examination     Status: Abnormal   Collection Time: 01/26/18  8:45 AM  Result Value Ref Range Status   WBC, UA 6-10 (A) 0 - 5 /hpf Final   RBC, UA 0-2 0 - 2 /hpf Final   Epithelial Cells (non renal) None seen 0 - 10 /hpf Final   Casts Present (A) None seen /lpf Final   Cast Type Hyaline casts N/A Final   Mucus, UA Present (A) Not Estab. Final   Bacteria, UA Few (A) None seen/Few Final     Radiologic Imaging: No results found.  Impression/Assessment:  72 year old female post procedure day 1 status post in office Botox injection to the bladder in the setting of anticoagulation admitted with clot retention.  Hemoglobin is stable and her Coumadin has been reversed with repeat INR pending.  Plan:  -Hand irrigate overnight as needed -Iinitiate belladonna and opium suppositories every 6 hours for severe bladder spasms -Continue CBI overnight -N.p.o. at midnight with reassessment first thing in the morning, she continues to have significant spasms and ongoing hematuria, will consider add on to the operating room for clot evacuation and fulguration as needed.  01/27/2018, 5:11 PM  Hollice Espy,  MD

## 2018-01-27 NOTE — ED Notes (Addendum)
2nd 3L bag of irrigation fluid started. Pt still passing large clots around the catheter

## 2018-01-27 NOTE — Progress Notes (Signed)
[facility] [place] NAME: Betty [name]    MR#:  [id number]  DATE OF BIRTH:  16-Feb-1946  SUBJECTIVE:  CHIEF COMPLAINT:   Chief Complaint  Patient presents with  . Hematuria     Patient was given Botox injection and bladder because of overactive bladder, she is on Coumadin for A. fib, came with hematuria since injection. On bladder irrigation, has complained of pain and spasms in her bladder and still passing blood and clots.  REVIEW OF SYSTEMS:  CONSTITUTIONAL: No fever, fatigue or weakness.  EYES: No blurred or double vision.  EARS, NOSE, AND THROAT: No tinnitus or ear pain.  RESPIRATORY: No cough, shortness of breath, wheezing or hemoptysis.  CARDIOVASCULAR: No chest pain, orthopnea, edema.  GASTROINTESTINAL: No nausea, vomiting, diarrhea or abdominal pain.  GENITOURINARY: No dysuria,positive for hematuria.  ENDOCRINE: No polyuria, nocturia,  HEMATOLOGY: No anemia, easy bruising or bleeding SKIN: No rash or lesion. MUSCULOSKELETAL: No joint pain or arthritis.   NEUROLOGIC: No tingling, numbness, weakness.  PSYCHIATRY: No anxiety or depression.   ROS  DRUG ALLERGIES:   Allergies  Allergen Reactions  . Flecainide Shortness Of Breath and Other (See Comments)    Reaction: dizziness   . Amiodarone Other (See Comments)    Pt states that this medication causes lung bleeding.      VITALS:  Blood pressure (!) 106/52, pulse 86, temperature 99.7 F (37.6 C), temperature source Oral, resp. rate 20, height 5\' 9"  (1.753 m), weight 101.1 kg (222 lb 12.8 oz), SpO2 93 %.  PHYSICAL EXAMINATION:  GENERAL:  72 y.o.-year-old patient lying in the bed with no acute distress.  EYES: Pupils equal, round, reactive to light and accommodation. No scleral icterus. Extraocular muscles intact.  HEENT: Head atraumatic, normocephalic. Oropharynx and nasopharynx clear.  NECK:  Supple, no jugular venous distention. No thyroid enlargement, no tenderness.   LUNGS: Normal breath sounds bilaterally, no wheezing, rales,rhonchi or crepitation. No use of accessory muscles of respiration.  CARDIOVASCULAR: S1, S2 normal. No murmurs, rubs, or gallops.  ABDOMEN: Soft, nontender, nondistended. Bowel sounds present. No organomegaly or mass. Foley catheter in place with irrigation, red urine in the bag. EXTREMITIES: No pedal edema, cyanosis, or clubbing.  NEUROLOGIC: Cranial nerves II through XII are intact. Muscle strength 4-5/5 in all extremities. Sensation intact. Gait not checked.  PSYCHIATRIC: The patient is alert and oriented x 3.  SKIN: No obvious rash, lesion, or ulcer.   Physical Exam LABORATORY PANEL:   CBC Recent Labs  Lab 01/27/18 0718  WBC 10.3  HGB 13.0  HCT 40.8  PLT 243   ------------------------------------------------------------------------------------------------------------------  Chemistries  Recent Labs  Lab 01/26/18 2157 01/27/18 0718  NA 134* 138  K 4.1 3.6  CL 94* 92*  CO2 29 33*  GLUCOSE 190* 130*  BUN 33* 30*  CREATININE 1.17* 0.87  CALCIUM 8.6* 8.6*  AST 39  --   ALT 17  --   ALKPHOS 96  --   BILITOT 0.8  --    ------------------------------------------------------------------------------------------------------------------  Cardiac Enzymes No results for input(s): TROPONINI in the last 168 hours. ------------------------------------------------------------------------------------------------------------------  RADIOLOGY:  No results found.  ASSESSMENT AND PLAN:   Active Problems:   Hematuria  * Hematuria   Seen by urologist, on continuous bladder irrigation.   Hemoglobin currently stable, follow daily.   INR is high secondary to Coumadin, I will give vitamin K to reverse the effect.  * A. Fib - paroxysmal.   Rate is controlled, on Coumadin  at home.   I will hold Coumadin and give vitamin K to reverse the effect because of active bleeding.   Continue Cardizem. Continue metoprolol.  *  Hypertension   Currently blood pressure is running low normal.   Continue home medications.  * Bladder spasms and pain   Due to active bleeding.   Supportive care with morphine and suppository.  * Diabetes   Continue metformin, insulin sliding scale coverage.   All the records are reviewed and case discussed with Care Management/Social Workerr. Management plans discussed with the patient, family and they are in agreement.  CODE STATUS: Full.  TOTAL TIME TAKING CARE OF THIS PATIENT: 35 minutes.     POSSIBLE D/C IN 1-2 DAYS, DEPENDING ON CLINICAL CONDITION.   Vaughan Basta M.D on 01/27/2018   Between 7am to 6pm - Pager - [phone]  After 6pm go to [email or web address] - password [email or web address] [facility]  Office  [phone]  CC: Primary care physician; Dion Body, MD  Note: This dictation was prepared with Dragon dictation along with smaller phrase technology. Any transcriptional errors that result from this process are unintentional.

## 2018-01-27 NOTE — ED Notes (Signed)
MD cleared Pt to eat and drink. Pt provided sandwich tray and diet shasta by RN

## 2018-01-27 NOTE — Progress Notes (Signed)
Dr. Erlene Quan in to see patient. Dr. Erlene Quan flushed foley. 500cc of bldy drainage with clots obtained. Pt tolerated bladder irrigation well. Orders received will continue to monitor.

## 2018-01-27 NOTE — Progress Notes (Signed)
Pharmacy Antibiotic Note  Betty [name] is a 72 y.o. female admitted on 01/26/2018 with HCAP.  Pharmacy has been consulted for cefepime dosing.  Plan: Cefepime 2 grams q 8 hours ordered  Height: 5\' 9"  (175.3 cm) Weight: 222 lb 0.1 oz (100.7 kg) IBW/kg (Calculated) : 66.2  Temp (24hrs), Avg:99.2 F (37.3 C), Min:98.3 F (36.8 C), Max:99.7 F (37.6 C)  Recent Labs  Lab 01/26/18 2157 01/27/18 0718  WBC 12.5* 10.3  CREATININE 1.17* 0.87    Estimated Creatinine Clearance: 74.9 mL/min (by C-G formula based on SCr of 0.87 mg/dL).    Allergies  Allergen Reactions  . Flecainide Shortness Of Breath and Other (See Comments)    Reaction: dizziness   . Amiodarone Other (See Comments)    Pt states that this medication causes lung bleeding.      Antimicrobials this admission: Cefepime 2/6  >>    >>  Dose adjustments this admission:   Microbiology results: 2/6 MRSA PCR: pending     2/5 UA: (-)  Thank you for allowing pharmacy to be a part of this patient's care.  Banner Huckaba S 01/27/2018 11:44 PM

## 2018-01-28 ENCOUNTER — Inpatient Hospital Stay: Payer: Medicare Other | Admitting: Anesthesiology

## 2018-01-28 ENCOUNTER — Encounter: Payer: Self-pay | Admitting: Anesthesiology

## 2018-01-28 ENCOUNTER — Encounter
Admission: EM | Disposition: A | Discharge status: Skilled Nursing Facility | Payer: Self-pay | Source: Home / Self Care | Attending: Internal Medicine

## 2018-01-28 DIAGNOSIS — N3289 Other specified disorders of bladder: Secondary | ICD-10-CM

## 2018-01-28 DIAGNOSIS — J9621 Acute and chronic respiratory failure with hypoxia: Secondary | ICD-10-CM

## 2018-01-28 DIAGNOSIS — N3281 Overactive bladder: Secondary | ICD-10-CM

## 2018-01-28 DIAGNOSIS — R310 Gross hematuria: Secondary | ICD-10-CM

## 2018-01-28 HISTORY — PX: CYSTOSCOPY WITH FULGERATION: SHX6638

## 2018-01-28 LAB — BASIC METABOLIC PANEL
ANION GAP: 11 (ref 5–15)
BUN: 29 mg/dL — ABNORMAL HIGH (ref 6–20)
CALCIUM: 8.4 mg/dL — AB (ref 8.9–10.3)
CO2: 32 mmol/L (ref 22–32)
CREATININE: 1.05 mg/dL — AB (ref 0.44–1.00)
Chloride: 94 mmol/L — ABNORMAL LOW (ref 101–111)
GFR calc Af Amer: >60 mL/min (ref >60)
GFR calc non Af Amer: 52 mL/min — ABNORMAL LOW (ref >60)
Glucose, Bld: 144 mg/dL — ABNORMAL HIGH (ref 65–99)
Potassium: 3.8 mmol/L (ref 3.5–5.1)
Sodium: 137 mmol/L (ref 135–145)

## 2018-01-28 LAB — GLUCOSE, CAPILLARY
GLUCOSE-CAPILLARY: 145 mg/dL — AB (ref 65–99)
GLUCOSE-CAPILLARY: 118 mg/dL — AB (ref 65–99)
GLUCOSE-CAPILLARY: 112 mg/dL — AB (ref 65–99)
Glucose-Capillary: 146 mg/dL — ABNORMAL HIGH (ref 65–99)
Glucose-Capillary: 136 mg/dL — ABNORMAL HIGH (ref 65–99)
Glucose-Capillary: 147 mg/dL — ABNORMAL HIGH (ref 65–99)

## 2018-01-28 LAB — CBC
HEMATOCRIT: 33.6 % — AB (ref 35.0–47.0)
HEMOGLOBIN: 10.8 g/dL — AB (ref 12.0–16.0)
MCH: 28.3 pg (ref 26.0–34.0)
MCHC: 32.1 g/dL (ref 32.0–36.0)
MCV: 88.2 fL (ref 80.0–100.0)
PLATELETS: 280 10*3/uL (ref 150–440)
RBC: 3.81 MIL/uL (ref 3.80–5.20)
RDW: 19.5 % — ABNORMAL HIGH (ref 11.5–14.5)
WBC: 13.7 10*3/uL — AB (ref 3.6–11.0)

## 2018-01-28 LAB — PROCALCITONIN
Procalcitonin: <0.1 ng/mL
Procalcitonin: <0.1 ng/mL

## 2018-01-28 LAB — BLOOD GAS, ARTERIAL
Acid-Base Excess: 6.6 mmol/L — ABNORMAL HIGH (ref 0.0–2.0)
Bicarbonate: 35.1 mmol/L — ABNORMAL HIGH (ref 20.0–28.0)
DELIVERY SYSTEMS: POSITIVE
Expiratory PAP: 6
FIO2: 0.3
INSPIRATORY PAP: 12
Mechanical Rate: 10
O2 Saturation: 86.3 %
PCO2 ART: 73 mmHg — AB (ref 32.0–48.0)
Patient temperature: 37
pH, Arterial: 7.29 — ABNORMAL LOW (ref 7.350–7.450)
pO2, Arterial: 58 mmHg — ABNORMAL LOW (ref 83.0–108.0)

## 2018-01-28 LAB — PROTIME-INR
INR: 1.82
PROTHROMBIN TIME: 20.9 s — AB (ref 11.4–15.2)

## 2018-01-28 SURGERY — CYSTOSCOPY, WITH BLADDER FULGURATION
Anesthesia: General | Site: Bladder | Wound class: Clean Contaminated

## 2018-01-28 MED ORDER — BELLADONNA ALKALOIDS-OPIUM 16.2-60 MG RE SUPP
RECTAL | Status: AC
  Filled 2018-01-28: qty 1

## 2018-01-28 MED ORDER — EPHEDRINE SULFATE 50 MG/ML IJ SOLN
15.0000 mg | Freq: Once | INTRAMUSCULAR | Status: AC
  Administered 2018-01-28: 15 mg via INTRAVENOUS
  Filled 2018-01-28: qty 0.3

## 2018-01-28 MED ORDER — DEXMEDETOMIDINE HCL IN NACL 80 MCG/20ML IV SOLN
INTRAVENOUS | Status: AC
  Filled 2018-01-28: qty 20

## 2018-01-28 MED ORDER — ONDANSETRON HCL 4 MG/2ML IJ SOLN
INTRAMUSCULAR | Status: AC
  Filled 2018-01-28: qty 2

## 2018-01-28 MED ORDER — SUCCINYLCHOLINE CHLORIDE 20 MG/ML IJ SOLN
INTRAMUSCULAR | Status: AC
  Filled 2018-01-28: qty 1

## 2018-01-28 MED ORDER — LORAZEPAM 2 MG/ML IJ SOLN
0.5000 mg | Freq: Once | INTRAMUSCULAR | Status: AC
  Administered 2018-01-28: 0.5 mg via INTRAVENOUS
  Filled 2018-01-28: qty 1

## 2018-01-28 MED ORDER — DEXMEDETOMIDINE HCL IN NACL 200 MCG/50ML IV SOLN
INTRAVENOUS | Status: DC | PRN
  Administered 2018-01-28: .2 ug/kg/h via INTRAVENOUS

## 2018-01-28 MED ORDER — MIDAZOLAM HCL 2 MG/2ML IJ SOLN
INTRAMUSCULAR | Status: AC
  Filled 2018-01-28: qty 2

## 2018-01-28 MED ORDER — FENTANYL CITRATE (PF) 100 MCG/2ML IJ SOLN
INTRAMUSCULAR | Status: AC
  Filled 2018-01-28: qty 2

## 2018-01-28 MED ORDER — ACETAMINOPHEN 10 MG/ML IV SOLN
INTRAVENOUS | Status: DC | PRN
  Administered 2018-01-28: 1000 mg via INTRAVENOUS

## 2018-01-28 MED ORDER — DEXMEDETOMIDINE HCL 200 MCG/2ML IV SOLN
INTRAVENOUS | Status: DC | PRN
  Administered 2018-01-28: 20 ug via INTRAVENOUS

## 2018-01-28 MED ORDER — PHENYLEPHRINE HCL 10 MG/ML IJ SOLN
INTRAMUSCULAR | Status: DC | PRN
  Administered 2018-01-28 (×2): 100 ug via INTRAVENOUS

## 2018-01-28 MED ORDER — SODIUM CHLORIDE FLUSH 0.9 % IV SOLN
INTRAVENOUS | Status: AC
  Filled 2018-01-28: qty 10

## 2018-01-28 MED ORDER — TRAZODONE HCL 50 MG PO TABS
150.0000 mg | ORAL_TABLET | Freq: Every evening | ORAL | Status: DC | PRN
  Administered 2018-01-28: 23:00:00 150 mg via ORAL
  Filled 2018-01-28: qty 1

## 2018-01-28 MED ORDER — FENTANYL CITRATE (PF) 100 MCG/2ML IJ SOLN: 25.0000 ug | INTRAMUSCULAR | Status: DC | PRN

## 2018-01-28 MED ORDER — BELLADONNA ALKALOIDS-OPIUM 16.2-60 MG RE SUPP
RECTAL | Status: DC | PRN
  Administered 2018-01-28: 1 via RECTAL

## 2018-01-28 MED ORDER — ACETAMINOPHEN 10 MG/ML IV SOLN
INTRAVENOUS | Status: AC
  Filled 2018-01-28: qty 100

## 2018-01-28 MED ORDER — MIDAZOLAM HCL 2 MG/2ML IJ SOLN
INTRAMUSCULAR | Status: DC | PRN
  Administered 2018-01-28 (×2): 1 mg via INTRAVENOUS

## 2018-01-28 MED ORDER — ROCURONIUM BROMIDE 50 MG/5ML IV SOLN
INTRAVENOUS | Status: AC
  Filled 2018-01-28: qty 1

## 2018-01-28 MED ORDER — EPHEDRINE SULFATE 50 MG/ML IJ SOLN
INTRAMUSCULAR | Status: AC
  Administered 2018-01-28: 15 mg via INTRAVENOUS
  Filled 2018-01-28: qty 1

## 2018-01-28 MED ORDER — TRAZODONE HCL 100 MG PO TABS: 100.0000 mg | ORAL_TABLET | Freq: Every evening | ORAL | Status: DC | PRN

## 2018-01-28 MED ORDER — LIDOCAINE HCL (PF) 2 % IJ SOLN
INTRAMUSCULAR | Status: AC
  Filled 2018-01-28: qty 10

## 2018-01-28 SURGICAL SUPPLY — 24 items
BAG DRAIN CYSTO-URO LG1000N (MISCELLANEOUS) ×3 IMPLANT
BAG URO DRAIN 4000ML (MISCELLANEOUS) ×3 IMPLANT
CATH FOL 3WAY DBL BALLN 22FR 6 (CATHETERS) ×3 IMPLANT
CNTNR SPEC 2.5X3XGRAD LEK (MISCELLANEOUS) ×1
CONT SPEC 4OZ STER OR WHT (MISCELLANEOUS) ×2
CONTAINER SPEC 2.5X3XGRAD LEK (MISCELLANEOUS) ×1 IMPLANT
ELECT REM PT RETURN 9FT ADLT (ELECTROSURGICAL) ×3
ELECT URO BUGBEE 5F (MISCELLANEOUS) ×3
ELECTRODE REM PT RTRN 9FT ADLT (ELECTROSURGICAL) ×1 IMPLANT
ELECTRODE URO BUGBEE 5F (MISCELLANEOUS) ×1 IMPLANT
GLOVE BIOGEL M 6.5 STRL (GLOVE) ×3 IMPLANT
GOWN STRL REUS W/ TWL LRG LVL3 (GOWN DISPOSABLE) ×1 IMPLANT
GOWN STRL REUS W/TWL LRG LVL3 (GOWN DISPOSABLE) ×2
INTRODUCER DILATOR DOUBLE (INTRODUCER) ×3 IMPLANT
PACK CYSTO AR (MISCELLANEOUS) ×3 IMPLANT
SENSORWIRE 0.038 NOT ANGLED (WIRE) ×6
SET CYSTO W/LG BORE CLAMP LF (SET/KITS/TRAYS/PACK) ×3 IMPLANT
SHEATH URETERAL 12FRX35CM (MISCELLANEOUS) ×3 IMPLANT
SOL .9 NS 3000ML IRR  AL (IV SOLUTION) ×2
SOL .9 NS 3000ML IRR UROMATIC (IV SOLUTION) ×1 IMPLANT
SYRINGE IRR TOOMEY STRL 70CC (SYRINGE) ×3 IMPLANT
WATER STERILE IRR 1000ML POUR (IV SOLUTION) ×3 IMPLANT
WATER STERILE IRR 3000ML UROMA (IV SOLUTION) ×9 IMPLANT
WIRE SENSOR 0.038 NOT ANGLED (WIRE) ×2 IMPLANT

## 2018-01-28 NOTE — Transfer of Care (Signed)
Immediate Anesthesia Transfer of Care Note  Patient: Betty [name]  Procedure(s) Performed: CYSTOSCOPY WITH FULGERATION AND CLOT EVACUATION (N/A Bladder)  Patient Location: PACU  Anesthesia Type:MAC  Level of Consciousness: drowsy and responds to stimulation  Airway & Oxygen Therapy: Patient Spontanous Breathing and Patient connected to nasal cannula oxygen  Post-op Assessment: Report given to RN and Post -op Vital signs reviewed and stable  Post vital signs: Reviewed and stable  Last Vitals:  Vitals:   01/28/18 1100 01/28/18 1234  BP: 105/66 (!) 95/52  Pulse: 85 69  Resp: 18 18  Temp: 36.9 C   SpO2: 98% 95%    Last Pain:  Vitals:   01/28/18 1100  TempSrc: Oral  PainSc:       Patients Stated Pain Goal: 0 (82/64/15 8309)  Complications: No apparent anesthesia complications

## 2018-01-28 NOTE — Progress Notes (Signed)
Urology Consult Follow Up  Subjective: Continues to have significant amount of hematuria.  CBI titrated up overnight without hand irrigation.  Hemoglobin down to 10.8 today from 11.6 last night.  Transferred to the ICU yesterday afternoon due to increased intensity of nursing care requirement.  BiPAP overnight which is her baseline.   Anti-infectives: Anti-infectives (From admission, onward)   Start     Dose/Rate Route Frequency Ordered Stop   01/28/18 0000  ceFEPIme (MAXIPIME) 2 g in dextrose 5 % 50 mL IVPB     2 g 100 mL/hr over 30 Minutes Intravenous Every 8 hours 01/27/18 2343        Current Facility-Administered Medications  Medication Dose Route Frequency Provider Last Rate Last Dose  . 0.9 %  sodium chloride infusion   Intravenous Continuous Awilda Bill, NP 50 mL/hr at 01/28/18 0605    . acetaminophen (TYLENOL) tablet 650 mg  650 mg Oral Q6H PRN Saundra Shelling, MD       Or  . acetaminophen (TYLENOL) suppository 650 mg  650 mg Rectal Q6H PRN Pyreddy, Pavan, MD      . atorvastatin (LIPITOR) tablet 20 mg  20 mg Oral Daily Pyreddy, Reatha Harps, MD   20 mg at 01/27/18 0955  . azaTHIOprine (IMURAN) tablet 100 mg  100 mg Oral Daily Pyreddy, Reatha Harps, MD   100 mg at 01/27/18 0956  . calcium-vitamin D (OSCAL WITH D) 500-200 MG-UNIT per tablet 1 tablet  1 tablet Oral Daily Saundra Shelling, MD   1 tablet at 01/27/18 0954  . ceFEPIme (MAXIPIME) 2 g in dextrose 5 % 50 mL IVPB  2 g Intravenous Q8H Awilda Bill, NP   Stopped at 01/28/18 [phone]  . diltiazem (CARDIZEM CD) 24 hr capsule 180 mg  180 mg Oral Daily Pyreddy, Reatha Harps, MD   180 mg at 01/27/18 0955  . ferrous sulfate tablet 325 mg  325 mg Oral Daily Saundra Shelling, MD   325 mg at 01/27/18 0954  . fesoterodine (TOVIAZ) tablet 8 mg  8 mg Oral Daily Pyreddy, Reatha Harps, MD   8 mg at 01/27/18 0955  . fluticasone (FLONASE) 50 MCG/ACT nasal spray 2 spray  2 spray Each Nare Daily Pyreddy, Pavan, MD      . gabapentin (NEURONTIN) capsule 600 mg  600 mg Oral  BID Saundra Shelling, MD   600 mg at 01/27/18 2138  . insulin aspart (novoLOG) injection 0-9 Units  0-9 Units Subcutaneous TID WC Vaughan Basta, MD   1 Units at 01/27/18 1917  . loratadine (CLARITIN) tablet 10 mg  10 mg Oral Daily Pyreddy, Reatha Harps, MD   10 mg at 01/27/18 0956  . magnesium oxide (MAG-OX) tablet 400 mg  400 mg Oral Daily Pyreddy, Reatha Harps, MD   400 mg at 01/27/18 0954  . metFORMIN (GLUCOPHAGE) tablet 1,000 mg  1,000 mg Oral BID WC Pyreddy, Reatha Harps, MD   1,000 mg at 01/27/18 1909  . [START ON 01/29/2018] metolazone (ZAROXOLYN) tablet 2.5 mg  2.5 mg Oral Once per day on Mon Thu Pyreddy, Pavan, MD      . morphine 2 MG/ML injection 2 mg  2 mg Intravenous Q4H PRN Saundra Shelling, MD   2 mg at 01/27/18 1826  . morphine 2 MG/ML injection 2 mg  2 mg Intravenous Once Vaughan Basta, MD      . norepinephrine (LEVOPHED) 4 mg in dextrose 5 % 250 mL (0.016 mg/mL) infusion  0-40 mcg/min Intravenous Titrated Awilda Bill, NP   Stopped at 01/28/18 0200  .  ondansetron (ZOFRAN) tablet 4 mg  4 mg Oral Q6H PRN Pyreddy, Reatha Harps, MD       Or  . ondansetron (ZOFRAN) injection 4 mg  4 mg Intravenous Q6H PRN Pyreddy, Reatha Harps, MD      . opium-belladonna (B&O SUPPRETTES) 16.2-60 MG suppository 1 suppository  1 suppository Rectal Q6H PRN Hollice Espy, MD   1 suppository at 01/27/18 2138  . pantoprazole (PROTONIX) EC tablet 40 mg  40 mg Oral Daily Pyreddy, Reatha Harps, MD   40 mg at 01/27/18 0954  . potassium chloride SA (K-DUR,KLOR-CON) CR tablet 20 mEq  20 mEq Oral Daily Pyreddy, Reatha Harps, MD   20 mEq at 01/27/18 0954  . propafenone (RYTHMOL) tablet 225 mg  225 mg Oral Q12H Saundra Shelling, MD   225 mg at 01/27/18 2139  . rOPINIRole (REQUIP) tablet 4 mg  4 mg Oral QHS Saundra Shelling, MD   4 mg at 01/27/18 2138  . senna-docusate (Senokot-S) tablet 2 tablet  2 tablet Oral BID Saundra Shelling, MD   2 tablet at 01/27/18 2138  . traZODone (DESYREL) tablet 100 mg  100 mg Oral QHS PRN Awilda Bill, NP      .  venlafaxine [facility]) tablet 75 mg  75 mg Oral TID WC Vaughan Basta, MD   75 mg at 01/27/18 1909  . [START ON 02/02/2018] Vitamin D (Ergocalciferol) (DRISDOL) capsule 50,000 Units  50,000 Units Oral Q7 days Pyreddy, Reatha Harps, MD         Objective: Vital signs in last 24 hours: Temp:  [98.7 F (37.1 C)-99.7 F (37.6 C)] 98.7 F (37.1 C) (02/06 0400) Pulse Rate:  [61-88] 73 (02/06 0600) Resp:  [11-28] 13 (02/06 0600) BP: (69-122)/(35-76) 117/56 (02/06 0600) SpO2:  [68 %-99 %] 93 % (02/06 0816) Weight:  [222 lb 0.1 oz (100.7 kg)] 222 lb 0.1 oz (100.7 kg) (02/05 1757)  Intake/Output from previous day: 02/05 0701 - 02/06 0700 In: 14203.8 [I.V.:1203.8] Out: 79024 [Urine:59575] Intake/Output this shift: No intake/output data recorded.   Physical Exam  No acute distress, alert and oriented but occasionally confused Abdomen soft, nondistended Foley catheter in place draining cherry red urine with clots, hand irrigated with 500 cc of normal saline with return of good amount of dark clot burden, never cleared completely.  CBI resumed on fast drip.  Lab Results:  Recent Labs    01/27/18 0718 01/27/18 2314 01/28/18 0504  WBC 10.3  --  13.7*  HGB 13.0 11.6* 10.8*  HCT 40.8 35.7 33.6*  PLT 243  --  280   BMET Recent Labs    01/27/18 0718 01/28/18 0504  NA 138 137  K 3.6 3.8  CL 92* 94*  CO2 33* 32  GLUCOSE 130* 144*  BUN 30* 29*  CREATININE 0.87 1.05*  CALCIUM 8.6* 8.4*   PT/INR Recent Labs    01/26/18 2157 01/28/18 0504  LABPROT 30.3* 20.9*  INR 2.93 1.82   ABG Recent Labs    01/28/18 0457  PHART 7.29*  HCO3 35.1*    Studies/Results: Dg Chest Port 1 View  Result Date: 01/27/2018 CLINICAL DATA:  Acute respiratory distress EXAM: PORTABLE CHEST 1 VIEW COMPARISON:  07/19/2017, report 01/01/2018 FINDINGS: Post sternotomy changes. Left-sided pacing device with leads over the right atrium and right ventricle. Epicardial leads also noted. Valve replacement.  Cardiomegaly with vascular congestion, small pleural effusions and diffuse interstitial opacities suggesting interstitial edema on chronic underlying fibro interstitial changes. Aortic atherosclerosis. No pneumothorax. IMPRESSION: 1. Cardiomegaly with vascular congestion and diffuse interstitial  opacity consistent with edema superimposed on underlying chronic interstitial disease 2. Small pleural effusions. Electronically Signed   By: Donavan Foil M.D.   On: 01/27/2018 23:36     Assessment: 72 year old female with gross hematuria/clot retention following intravesical Botox injection on Monday in the setting of elevated INR from Coumadin.  INR is trending down with vitamin K administration.  Continues to have fairly significant hematuria and clots with difficult to manage CBI.  Plan: -Recommend proceeding to the operating room later today under sedation --Risk and benefits reviewed in detail --This is also discussed by telephone today with her son who is agreeable with the plan -Remain n.p.o. until following the procedure -Nurse educated on Foley catheter irrigation/CBI maintenance    LOS: 1 day    Hollice Espy 01/28/2018

## 2018-01-28 NOTE — OR Nursing (Signed)
Continuous bladder irrigation stopped per Dr. Erlene Quan urine clear , if urine is pink turn irrigation back on.

## 2018-01-28 NOTE — Op Note (Signed)
Date of procedure: 01/28/18  Preoperative diagnosis:  1. Gross hematuria 2. Clot retention 3. Overactive bladder  Postoperative diagnosis:  1. Same as above  Procedure: 1. Cystoscopy 2. Clot evacuation 3. Bladder fulguration  Surgeon: Hollice Espy, MD  Anesthesia: Sedation  Complications: None  Intraoperative findings: Approximately 100 cc of well organized clot evacuated from bladder.  One pinpoint bleeding vessel identified on mid posterior bladder wall, pulsatile.  Fulgurated with no further hematuria.  EBL: Minimal  Specimens: None  Drains: 22 French three-way Foley catheter  Indication: Betty [name] is a 72 y.o. patient with gross hematuria following Botox injection presenting with clot retention.  She has been managed on CBI but has refractory bleeding and clot is difficult to evacuate due to severe bladder spasms.  After reviewing the management options for treatment,s he elected to proceed with the above surgical procedure(s). We have discussed the potential benefits and risks of the procedure, side effects of the proposed treatment, the likelihood of the patient achieving the goals of the procedure, and any potential problems that might occur during the procedure or recuperation. Informed consent has been obtained.  Description of procedure:  The patient was taken to the operating room and monitored anesthesia care with sedation was induced.  The patient was placed in the dorsal lithotomy position, prepped and draped in the usual sterile fashion, and preoperative antibiotics were administered. A preoperative time-out was performed.   A 21 French scope was advanced per urethra into the bladder.  A well-formed dense clot was immediately apparent.  Attempted to evacuate the clot through 21 French rigid cystoscope sheath but she had severe bladder spasms and was voiding around the cystoscope expelling small clots.  At this point in time, a B&O suppository was applied.   Sedation was deepened slightly.  The 21 French cystoscope was exchanged for the 26 [place] resectoscope.  At this point time, I was successful in evacuating approximately 100 cc of well-formed clot.  Ultimately, I was able to clear her entire bladder.  Under direct visualization, there is some catheter cystitis at the dome of the bladder as well as one pinpoint area of active bleeding noted through a small poke incision on the midline posterior bladder wall.  This was somewhat pulsatile in nature.  There are no areas of active bleeding noted.  Bugbee electrocautery was then used to fulgurate this small area.  Immediately, the bladder cleared and there was no residual bleeding noted.  Hemostasis was excellent without any irrigation at this point in time.  Clear reflux could be seen from both ureteral orifice ease.  This point time, the procedure was deemed complete.  The scope was removed and a 22 [place] three-way Foley catheter was replaced.  Initially she was resumed on CBI but it was quickly apparent that this was not necessary and CBI was clamped off.  She was then weaned from sedation and transferred to her hospital bed and taken to the PACU in stable condition.    Plan: It does not appear that CBI is necessary at this point in time.  It may be resumed as needed overnight.  If her urine stays stable and clear overnight, she may be discharged home tomorrow with Foley catheter in place.  Would recommend downsizing to 67 [place] prior to discharge.  Hollice Espy, M.D.

## 2018-01-28 NOTE — Anesthesia Preprocedure Evaluation (Addendum)
Anesthesia Evaluation  Patient identified by MRN, date of birth, ID band Patient awake    Reviewed: Allergy & Precautions, NPO status , Patient's Chart, lab work & pertinent test results, reviewed documented beta blocker date and time   Airway Mallampati: III  TM Distance: <3 FB     Dental   Pulmonary shortness of breath, asthma , sleep apnea , COPD,  COPD inhaler, former smoker,    Pulmonary exam normal        Cardiovascular hypertension, Pt. on medications and Pt. on home beta blockers + Peripheral Vascular Disease and +CHF  Normal cardiovascular exam+ dysrhythmias Atrial Fibrillation      Neuro/Psych  Headaches, PSYCHIATRIC DISORDERS Depression    GI/Hepatic Neg liver ROS, GERD  Medicated,  Endo/Other  diabetesHyperthyroidism   Renal/GU      Musculoskeletal  (+) Arthritis , Osteoarthritis,    Abdominal (+) + obese,   Peds negative pediatric ROS (+)  Hematology  (+) anemia ,   Anesthesia Other Findings   Reproductive/Obstetrics                           Anesthesia Physical Anesthesia Plan  ASA: III and emergent  Anesthesia Plan: General   Post-op Pain Management:    Induction: Intravenous  PONV Risk Score and Plan: TIVA  Airway Management Planned:   Additional Equipment:   Intra-op Plan:   Post-operative Plan:   Informed Consent: I have reviewed the patients History and Physical, chart, labs and discussed the procedure including the risks, benefits and alternatives for the proposed anesthesia with the patient or authorized representative who has indicated his/her understanding and acceptance.   Dental advisory given  Plan Discussed with: CRNA and Surgeon  Anesthesia Plan Comments:        Anesthesia Quick Evaluation

## 2018-01-28 NOTE — Anesthesia Procedure Notes (Signed)
Performed by: Jyasia Markoff, CRNA Pre-anesthesia Checklist: Patient identified, Emergency Drugs available, Suction available, Patient being monitored and Timeout performed Oxygen Delivery Method: Nasal cannula       

## 2018-01-28 NOTE — Progress Notes (Addendum)
To OR per bed. VSS Alert.Intermittently confused. Sats good on 4 L/[place]. Son Natale Lay gave phone permission for cystoscopy with clot evacuation by Dr Lyla Son. He is aware she is going to OR shortly rather than at  3pm.  He is fine with earlier schedule.Pt now headed to OR.  Bladder irrigation has been running wide open to maintain pink return. Report given to OR nurse. Pt had 29ml water with am meds at 0940.

## 2018-01-28 NOTE — Anesthesia Postprocedure Evaluation (Signed)
Anesthesia Post Note  Patient: Betty [name]  Procedure(s) Performed: CYSTOSCOPY WITH FULGERATION AND CLOT EVACUATION (N/A Bladder)  Patient location during evaluation: PACU Anesthesia Type: General Level of consciousness: awake and alert and oriented Pain management: pain level controlled Vital Signs Assessment: post-procedure vital signs reviewed and stable Respiratory status: spontaneous breathing Cardiovascular status: blood pressure returned to baseline Anesthetic complications: no     Last Vitals:  Vitals:   01/28/18 1500 01/28/18 1600  BP: 105/60 (!) 113/55  Pulse: 74 77  Resp: 12 14  Temp:    SpO2: 98% 98%    Last Pain:  Vitals:   01/28/18 1334  TempSrc: Oral  PainSc:                  Railee Bonillas

## 2018-01-28 NOTE — Progress Notes (Signed)
[place] [facility] [place] NAME: Betty [name]    MR#:  [id number]  DATE OF BIRTH:  [birth year]  SUBJECTIVE:  CHIEF COMPLAINT:   Chief Complaint  Patient presents with  . Hematuria     Patient was given Botox injection and bladder because of overactive bladder, she is on Coumadin for mechanical heart valve, came with hematuria since injection. On bladder irrigation, has complained of pain and spasms in her bladder and still passing blood and clots. So vitamin K oral was given to reverse her INR. Continue to have bleeding. Taken for procedure and bleeder was found in her bladder, which was stopped by fulguration.  REVIEW OF SYSTEMS:  CONSTITUTIONAL: No fever, fatigue or weakness.  EYES: No blurred or double vision.  EARS, NOSE, AND THROAT: No tinnitus or ear pain.  RESPIRATORY: No cough, shortness of breath, wheezing or hemoptysis.  CARDIOVASCULAR: No chest pain, orthopnea, edema.  GASTROINTESTINAL: No nausea, vomiting, diarrhea or abdominal pain.  GENITOURINARY: No dysuria,positive for hematuria.  ENDOCRINE: No polyuria, nocturia,  HEMATOLOGY: No anemia, easy bruising or bleeding SKIN: No rash or lesion. MUSCULOSKELETAL: No joint pain or arthritis.   NEUROLOGIC: No tingling, numbness, weakness.  PSYCHIATRY: No anxiety or depression.   ROS  DRUG ALLERGIES:   Allergies  Allergen Reactions  . Flecainide Shortness Of Breath and Other (See Comments)    Reaction: dizziness   . Amiodarone Other (See Comments)    Pt states that this medication causes lung bleeding.      VITALS:  Blood pressure (!) 99/59, pulse 71, temperature 98.2 F (36.8 C), temperature source Oral, resp. rate 18, height 5\' 9"  (1.753 m), weight 100.7 kg (222 lb 0.1 oz), SpO2 99 %.  PHYSICAL EXAMINATION:  GENERAL:  72 y.o.-year-old patient lying in the bed with no acute distress.  EYES: Pupils equal, round, reactive to light and accommodation. No scleral icterus. Extraocular  muscles intact.  HEENT: Head atraumatic, normocephalic. Oropharynx and nasopharynx clear.  NECK:  Supple, no jugular venous distention. No thyroid enlargement, no tenderness.  LUNGS: Normal breath sounds bilaterally, no wheezing, rales,rhonchi or crepitation. No use of accessory muscles of respiration.  CARDIOVASCULAR: S1, S2 normal. No murmurs, rubs, or gallops.  ABDOMEN: Soft, nontender, nondistended. Bowel sounds present. No organomegaly or mass. Foley catheter in place with clear urine. EXTREMITIES: No pedal edema, cyanosis, or clubbing.  NEUROLOGIC: Cranial nerves II through XII are intact. Muscle strength 4-5/5 in all extremities. Sensation intact. Gait not checked.  PSYCHIATRIC: The patient is alert and oriented x 2. Just came back from procedure, still some effect of meds. SKIN: No obvious rash, lesion, or ulcer.   Physical Exam LABORATORY PANEL:   CBC Recent Labs  Lab 01/28/18 0504  WBC 13.7*  HGB 10.8*  HCT 33.6*  PLT 280   ------------------------------------------------------------------------------------------------------------------  Chemistries  Recent Labs  Lab 01/26/18 2157  01/28/18 0504  NA 134*   < > 137  K 4.1   < > 3.8  CL 94*   < > 94*  CO2 29   < > 32  GLUCOSE 190*   < > 144*  BUN 33*   < > 29*  CREATININE 1.17*   < > 1.05*  CALCIUM 8.6*   < > 8.4*  AST 39  --   --   ALT 17  --   --   ALKPHOS 96  --   --   BILITOT 0.8  --   --    < > =  values in this interval not displayed.   ------------------------------------------------------------------------------------------------------------------  Cardiac Enzymes No results for input(s): TROPONINI in the last 168 hours. ------------------------------------------------------------------------------------------------------------------  RADIOLOGY:  Dg Chest Port 1 View  Result Date: 01/27/2018 CLINICAL DATA:  Acute respiratory distress EXAM: PORTABLE CHEST 1 VIEW COMPARISON:  07/19/2017, report  01/01/2018 FINDINGS: Post sternotomy changes. Left-sided pacing device with leads over the right atrium and right ventricle. Epicardial leads also noted. Valve replacement. Cardiomegaly with vascular congestion, small pleural effusions and diffuse interstitial opacities suggesting interstitial edema on chronic underlying fibro interstitial changes. Aortic atherosclerosis. No pneumothorax. IMPRESSION: 1. Cardiomegaly with vascular congestion and diffuse interstitial opacity consistent with edema superimposed on underlying chronic interstitial disease 2. Small pleural effusions. Electronically Signed   By: Donavan Foil M.D.   On: 01/27/2018 23:36    ASSESSMENT AND PLAN:   Active Problems:   Hematuria  * Hematuria   Seen by urologist, on continuous bladder irrigation.   Hemoglobin dropped from 13- 10.8   INR is high secondary to Coumadin, vitamin K to reverse the effect.   Cystoscopy and bladder fulguration done, Bleeding stopped now.    Urology advise to cont foley on d/c.  * A. Fib - paroxysmal.   Rate is controlled, on Coumadin at home.   Coumadin on hold because of bleeding, I will speak to urologist about starting it back.   Continue Cardizem. Continue metoprolol.   S/p pacemaker.  * Mechanical aortic valve   On Coumadin at home, currently held because of active bleeding.   Urologist to allow restarting Coumadin.   We may have to bridge with heparine IV.  * Interstitial lung disease   On CPAP at home, also on Imuran.   Advised to continue following as outpatient.  * Hypertension   Currently blood pressure is running low normal.   Continue home medications. IV fluids.  * Bladder spasms and pain   Due to active bleeding.   Supportive care with morphine and suppository.  * Diabetes   Continue metformin, insulin sliding scale coverage.  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 Management plans discussed with the patient, family and they are in agreement.  CODE STATUS: Full.  TOTAL TIME TAKING CARE OF THIS PATIENT: 35 minutes.    POSSIBLE D/C IN 1-2 DAYS, DEPENDING ON CLINICAL CONDITION.   Vaughan Basta M.D on 01/28/2018   Between 7am to 6pm - Pager - [phone]  After 6pm go to [email or web address] - password [email or web address] [facility]  Office  [phone]  CC: Primary care physician; Dion Body, MD  Note: This dictation was prepared with Dragon dictation along with smaller phrase technology. Any transcriptional errors that result from this process are unintentional.

## 2018-01-28 NOTE — Anesthesia Post-op Follow-up Note (Signed)
Anesthesia QCDR form completed.        

## 2018-01-29 LAB — BASIC METABOLIC PANEL
Anion gap: 12 (ref 5–15)
BUN: 23 mg/dL — AB (ref 6–20)
CHLORIDE: 96 mmol/L — AB (ref 101–111)
CO2: 26 mmol/L (ref 22–32)
Calcium: 8.4 mg/dL — ABNORMAL LOW (ref 8.9–10.3)
Creatinine, Ser: 0.82 mg/dL (ref 0.44–1.00)
GFR calc Af Amer: >60 mL/min (ref >60)
GFR calc non Af Amer: >60 mL/min (ref >60)
GLUCOSE: 168 mg/dL — AB (ref 65–99)
POTASSIUM: 3.6 mmol/L (ref 3.5–5.1)
SODIUM: 134 mmol/L — AB (ref 135–145)

## 2018-01-29 LAB — GLUCOSE, CAPILLARY
GLUCOSE-CAPILLARY: 109 mg/dL — AB (ref 65–99)
GLUCOSE-CAPILLARY: 138 mg/dL — AB (ref 65–99)
Glucose-Capillary: 176 mg/dL — ABNORMAL HIGH (ref 65–99)
Glucose-Capillary: 214 mg/dL — ABNORMAL HIGH (ref 65–99)

## 2018-01-29 LAB — CBC
HEMATOCRIT: 29.9 % — AB (ref 35.0–47.0)
Hemoglobin: 9.6 g/dL — ABNORMAL LOW (ref 12.0–16.0)
MCH: 28.4 pg (ref 26.0–34.0)
MCHC: 32.1 g/dL (ref 32.0–36.0)
MCV: 88.3 fL (ref 80.0–100.0)
Platelets: 206 10*3/uL (ref 150–440)
RBC: 3.38 MIL/uL — AB (ref 3.80–5.20)
RDW: 19.2 % — ABNORMAL HIGH (ref 11.5–14.5)
WBC: 10.3 10*3/uL (ref 3.6–11.0)

## 2018-01-29 LAB — PROCALCITONIN: Procalcitonin: <0.1 ng/mL

## 2018-01-29 LAB — PROTIME-INR
INR: 1.25
Prothrombin Time: 15.6 seconds — ABNORMAL HIGH (ref 11.4–15.2)

## 2018-01-29 MED ORDER — WARFARIN SODIUM 6 MG PO TABS
6.0000 mg | ORAL_TABLET | Freq: Every day | ORAL | Status: DC
  Administered 2018-01-29 – 2018-01-30 (×2): 6 mg via ORAL
  Filled 2018-01-29 (×2): qty 1

## 2018-01-29 MED ORDER — ENOXAPARIN SODIUM 100 MG/ML ~~LOC~~ SOLN
1.0000 mg/kg | Freq: Two times a day (BID) | SUBCUTANEOUS | Status: DC
  Administered 2018-01-29 – 2018-01-30 (×3): 100 mg via SUBCUTANEOUS
  Filled 2018-01-29 (×5): qty 1

## 2018-01-29 MED ORDER — GUAIFENESIN 100 MG/5ML PO SOLN
5.0000 mL | ORAL | Status: DC | PRN
  Administered 2018-01-29 – 2018-02-02 (×3): 100 mg via ORAL
  Filled 2018-01-29: qty 10
  Filled 2018-01-29 (×2): qty 5
  Filled 2018-01-29: qty 10

## 2018-01-29 MED ORDER — WARFARIN - PHARMACIST DOSING INPATIENT
Freq: Every day | Status: DC
  Administered 2018-01-29 – 2018-01-30 (×2)

## 2018-01-29 NOTE — Evaluation (Signed)
Physical Therapy Evaluation Patient Details Name: Betty [name] MRN: [id number] DOB: 14-Apr-1946 Today's Date: 01/29/2018   History of Present Illness  Pt is a 72 y.o. female admitted following c/o lower abdominal pain and dx of bladder spasm and hematuria.  PMH includes ANCA associated vasculitis, asthma, atrial fibrillation, LBP, cardiomegaly, COPD, DM, diffuse pulmonary alveolar hemorrhage, esophageal reflux, HA, Herpes zoster, UTI, Htn, CHF, nontoxic uninodular goiter, obesity, OA, sleep apnea and urine incontinence.  Clinical Impression  Pt is a 72 year old female who lives in a one story house alone.  She was on [facility] upon PT arrival and able to perform transfer with CGA and VC's for hand placement and proper use of RW.  Pt experienced a posterior LOB when standing at bedside which PT assisted pt in correcting.  Pt presented with weakness of UE and WNL LE strength during MMT.   Pt was able to ambulate 30 ft total in rm with 3 rest breaks and requires CGA or RW for balance support.  Pt fatigues quickly and reports feeling "light headed".  Pt O2 sats remain close to 90% during activity.  Pt will benefit from skilled PT with focus on strength, tolerance to activity, balance, functional mobility and safe management of RW.    Follow Up Recommendations SNF    Equipment Recommendations  Rolling walker with 5" wheels    Recommendations for Other Services       Precautions / Restrictions Precautions Precautions: Fall Restrictions Weight Bearing Restrictions: No      Mobility  Bed Mobility Overal bed mobility: Modified Independent                Transfers Overall transfer level: Needs assistance Equipment used: Rolling walker (2 wheeled) Transfers: Sit to/from Stand Sit to Stand: Min guard         General transfer comment: Pt able to initiate STS but experiences posterior LOB upon standing upright.    Ambulation/Gait Ambulation/Gait assistance: Supervision Ambulation  Distance (Feet): 30 Feet Assistive device: Rolling walker (2 wheeled)     Gait velocity interpretation: Below normal speed for age/gender General Gait Details: Pt presented with good foot clearance and hip and knee flexion, tendency to remain in flexed posture.  PT provided VC's for management of RW as pt does not normally use a RW.  Stairs            Wheelchair Mobility    Modified Rankin (Stroke Patients Only)       Balance Overall balance assessment: Needs assistance Sitting-balance support: Bilateral upper extremity supported     Postural control: Posterior lean Standing balance support: Bilateral upper extremity supported                                 Pertinent Vitals/Pain Pain Assessment: 0-10 Pain Score: 5  Pain Location: neck    Home Living Family/patient expects to be discharged to:: Private residence Living Arrangements: Alone Available Help at Discharge: Family;Available PRN/intermittently Type of Home: House Home Access: Stairs to enter Entrance Stairs-Rails: Right Entrance Stairs-Number of Steps: 1 Home Layout: One level Home Equipment: Grab bars - tub/shower      Prior Function Level of Independence: Independent         Comments: Drives to get groceries     Hand Dominance   Dominant Hand: Left    Extremity/Trunk Assessment   Upper Extremity Assessment Upper Extremity Assessment: Generalized weakness    Lower  Extremity Assessment Lower Extremity Assessment: Overall WFL for tasks assessed    Cervical / Trunk Assessment Cervical / Trunk Assessment: Normal  Communication   Communication: No difficulties  Cognition Arousal/Alertness: Awake/alert Behavior During Therapy: WFL for tasks assessed/performed Overall Cognitive Status: Within Functional Limits for tasks assessed                                        General Comments      Exercises     Assessment/Plan    PT Assessment Patient  needs continued PT services  PT Problem List Decreased strength;Decreased mobility;Decreased activity tolerance;Decreased balance;Decreased knowledge of use of DME       PT Treatment Interventions DME instruction;Therapeutic activities;Gait training;Therapeutic exercise;Functional mobility training;Balance training;Patient/family education;Stair training    PT Goals (Current goals can be found in the Care Plan section)  Acute Rehab PT Goals Patient Stated Goal: To regain strength to be able to walk like she did before coming to the hospital. PT Goal Formulation: With patient Time For Goal Achievement: 02/12/18 Potential to Achieve Goals: Good    Frequency Min 2X/week   Barriers to discharge        Co-evaluation               AM-PAC PT "6 Clicks" Daily Activity  Outcome Measure Difficulty turning over in bed (including adjusting bedclothes, sheets and blankets)?: A Little Difficulty moving from lying on back to sitting on the side of the bed? : A Little Difficulty sitting down on and standing up from a chair with arms (e.g., wheelchair, bedside commode, etc,.)?: A Little Help needed moving to and from a bed to chair (including a wheelchair)?: A Little Help needed walking in hospital room?: A Little Help needed climbing 3-5 steps with a railing? : A Little 6 Click Score: 18    End of Session Equipment Utilized During Treatment: Gait belt;Oxygen Activity Tolerance: Patient limited by fatigue Patient left: in bed;with bed alarm set;with call bell/phone within reach Nurse Communication: Mobility status PT Visit Diagnosis: Unsteadiness on feet (R26.81);Muscle weakness (generalized) (M62.81)    Time: 1400-1430 PT Time Calculation (min) (ACUTE ONLY): 30 min   Charges:   PT Evaluation $PT Eval Low Complexity: 1 Low PT Treatments $Therapeutic Activity: 8-22 mins   PT G Codes:   PT G-Codes **NOT FOR INPATIENT CLASS** Functional Assessment Tool Used: AM-PAC 6 Clicks Basic  Mobility    Roxanne Gates, PT, DPT   Roxanne Gates 01/29/2018, 3:34 PM

## 2018-01-29 NOTE — Progress Notes (Signed)
Report called to [facility] on 2A, patient's O2 sensor moved to ear and writer obtained O2 reading in 90s consistently with 2 liters [place], BP 93/61 MAP 66, serenity notified, also reported to her that PT would benefit from a PT consult.  pt will move on 2A bed, SWOT RN Theadora Rama moving her.

## 2018-01-29 NOTE — Progress Notes (Addendum)
ANTICOAGULATION CONSULT NOTE - Initial Consult  Pharmacy Consult for enoxaparin and warfarin Indication: history of mechanical heart valve - bridge to therapeutic INR  Allergies  Allergen Reactions  . Flecainide Shortness Of Breath and Other (See Comments)    Reaction: dizziness   . Amiodarone Other (See Comments)    Pt states that this medication causes lung bleeding.      Patient Measurements: Height: 5\' 9"  (175.3 cm) Weight: 222 lb 0.1 oz (100.7 kg) IBW/kg (Calculated) : 66.2 Heparin Dosing Weight:   Vital Signs: Temp: 97.8 F (36.6 C) (02/07 1057) Temp Source: Tympanic (02/07 1057) BP: 122/46 (02/07 1057) Pulse Rate: 81 (02/07 1057)  Labs: Recent Labs    01/26/18 2157 01/27/18 0718 01/27/18 2314 01/28/18 0504 01/29/18 0456  HGB 13.0 13.0 11.6* 10.8* 9.6*  HCT 41.5 40.8 35.7 33.6* 29.9*  PLT 257 243  --  280 206  LABPROT 30.3*  --   --  20.9*  --   INR 2.93  --   --  1.82  --   CREATININE 1.17* 0.87  --  1.05* 0.82    Estimated Creatinine Clearance: 79.5 mL/min (by C-G formula based on SCr of 0.82 mg/dL).   Medical History: Past Medical History:  Diagnosis Date  . Acute diastolic heart failure ([place])   . Allergy   . ANCA-associated vasculitis ([place])   . Asthma   . Atrial fibrillation ([place])   . Backache, unspecified   . Cardiomegaly   . COPD (chronic obstructive pulmonary disease) ([place])   . Diabetes mellitus without complication ([place])   . 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 Esophageal reflux   . Headache(784.0)   . Herpes zoster without mention of complication   . Hx: UTI (urinary tract infection)   . Hypertension    heart controlled w CHF  . Nontoxic uninodular goiter   . Obesity, unspecified   . Osteoarthrosis, unspecified whether generalized or localized, unspecified site   . Unspecified sleep apnea   . Urine incontinence    hx of    Medications:  Infusions:  . ceFEPime (MAXIPIME) IV Stopped (01/29/18 0950)     Assessment: 71 yof cc hematuria after botox bladder injection for OAB. Warfarin PTA for AF mechanical valve was reversed with oral vitamin K. Now pharmacy consulted to dose LMWH to bridge to therapeutic INR post cystoscopy.   Goal of Therapy:  INR 2.5 to 3.5 due to mechanical mitral valve Monitor platelets by anticoagulation protocol: Yes   Plan:  1. Enoxaparin 1 mg/kg (100 mg) subcutaneously twice daily until INR therapeutic x 2. 2. Check INR this afternoon, then resume warfarin 6 mg po daily. Daily INRs until therapeutic x 2.   Laural Benes, Pharm.D., BCPS Clinical Pharmacist 01/29/2018,11:49 AM

## 2018-01-29 NOTE — Progress Notes (Signed)
Urology Consult Follow Up  Subjective: Postop day 1 status post clot evacuation fulguration.  No CBI overnight and her urine is crystal clear this a.m.  No complaints this morning.  Anti-infectives: Anti-infectives (From admission, onward)   Start     Dose/Rate Route Frequency Ordered Stop   01/28/18 0000  ceFEPIme (MAXIPIME) 2 g in dextrose 5 % 50 mL IVPB     2 g 100 mL/hr over 30 Minutes Intravenous Every 8 hours 01/27/18 2343        Current Facility-Administered Medications  Medication Dose Route Frequency Provider Last Rate Last Dose  . acetaminophen (TYLENOL) tablet 650 mg  650 mg Oral Q6H PRN Saundra Shelling, MD       Or  . acetaminophen (TYLENOL) suppository 650 mg  650 mg Rectal Q6H PRN Pyreddy, Pavan, MD      . atorvastatin (LIPITOR) tablet 20 mg  20 mg Oral Daily Pyreddy, Reatha Harps, MD   20 mg at 01/28/18 0948  . azaTHIOprine (IMURAN) tablet 100 mg  100 mg Oral Daily Pyreddy, Reatha Harps, MD   100 mg at 01/28/18 0950  . calcium-vitamin D (OSCAL WITH D) 500-200 MG-UNIT per tablet 1 tablet  1 tablet Oral Daily Saundra Shelling, MD   1 tablet at 01/28/18 0957  . ceFEPIme (MAXIPIME) 2 g in dextrose 5 % 50 mL IVPB  2 g Intravenous Q8H Awilda Bill, NP 100 mL/hr at 01/29/18 0802 2 g at 01/29/18 0802  . diltiazem (CARDIZEM CD) 24 hr capsule 180 mg  180 mg Oral Daily Pyreddy, Reatha Harps, MD   180 mg at 01/28/18 0951  . ferrous sulfate tablet 325 mg  325 mg Oral Daily Saundra Shelling, MD   325 mg at 01/28/18 0949  . fesoterodine (TOVIAZ) tablet 8 mg  8 mg Oral Daily Pyreddy, Reatha Harps, MD   8 mg at 01/28/18 0951  . fluticasone (FLONASE) 50 MCG/ACT nasal spray 2 spray  2 spray Each Nare Daily Saundra Shelling, MD   2 spray at 01/28/18 0952  . gabapentin (NEURONTIN) capsule 600 mg  600 mg Oral BID Saundra Shelling, MD   600 mg at 01/28/18 2213  . insulin aspart (novoLOG) injection 0-9 Units  0-9 Units Subcutaneous TID WC Vaughan Basta, MD   2 Units at 01/29/18 0813  . loratadine (CLARITIN) tablet 10 mg   10 mg Oral Daily Pyreddy, Reatha Harps, MD   10 mg at 01/28/18 0950  . magnesium oxide (MAG-OX) tablet 400 mg  400 mg Oral Daily Pyreddy, Reatha Harps, MD   400 mg at 01/28/18 0950  . metFORMIN (GLUCOPHAGE) tablet 1,000 mg  1,000 mg Oral BID WC Pyreddy, Reatha Harps, MD   1,000 mg at 01/29/18 0812  . metolazone (ZAROXOLYN) tablet 2.5 mg  2.5 mg Oral Once per day on Mon Thu Pyreddy, Pavan, MD   2.5 mg at 01/29/18 0865  . morphine 2 MG/ML injection 2 mg  2 mg Intravenous Q4H PRN Saundra Shelling, MD   2 mg at 01/27/18 1826  . morphine 2 MG/ML injection 2 mg  2 mg Intravenous Once Vaughan Basta, MD      . ondansetron (ZOFRAN) tablet 4 mg  4 mg Oral Q6H PRN Pyreddy, Reatha Harps, MD       Or  . ondansetron (ZOFRAN) injection 4 mg  4 mg Intravenous Q6H PRN Pyreddy, Reatha Harps, MD      . opium-belladonna (B&O SUPPRETTES) 16.2-60 MG suppository 1 suppository  1 suppository Rectal Q6H PRN Hollice Espy, MD   1 suppository at 01/27/18 2138  .  pantoprazole (PROTONIX) EC tablet 40 mg  40 mg Oral Daily Pyreddy, Reatha Harps, MD   40 mg at 01/28/18 0949  . potassium chloride SA (K-DUR,KLOR-CON) CR tablet 20 mEq  20 mEq Oral Daily Pyreddy, Reatha Harps, MD   20 mEq at 01/28/18 0947  . propafenone (RYTHMOL) tablet 225 mg  225 mg Oral Q12H Pyreddy, Reatha Harps, MD   225 mg at 01/28/18 2213  . rOPINIRole (REQUIP) tablet 4 mg  4 mg Oral QHS Saundra Shelling, MD   4 mg at 01/28/18 2213  . senna-docusate (Senokot-S) tablet 2 tablet  2 tablet Oral BID Saundra Shelling, MD   2 tablet at 01/28/18 2212  . traZODone (DESYREL) tablet 150 mg  150 mg Oral QHS PRN Awilda Bill, NP   150 mg at 01/28/18 2324  . venlafaxine (EFFEXOR) tablet 75 mg  75 mg Oral TID WC Vaughan Basta, MD   75 mg at 01/29/18 0811  . [START ON 02/02/2018] Vitamin D (Ergocalciferol) (DRISDOL) capsule 50,000 Units  50,000 Units Oral Q7 days Pyreddy, Reatha Harps, MD         Objective: Vital signs in last 24 hours: Temp:  [98.2 F (36.8 C)-99 F (37.2 C)] 99 F (37.2 C) (02/07 0200) Pulse  Rate:  [69-99] 73 (02/07 0600) Resp:  [12-27] 18 (02/07 0600) BP: (75-127)/(43-93) 114/54 (02/07 0600) SpO2:  [88 %-100 %] 99 % (02/07 0600)  Intake/Output from previous day: 02/06 0701 - 02/07 0700 In: 16967 [P.O.:960; I.V.:1160; IV Piggyback:150] Out: 13875 [Urine:13875] Intake/Output this shift: No intake/output data recorded.   Physical Exam  No acute distress, alert and oriented but occasionally confused Abdomen soft, nondistended Foley catheter draining clear yellow urine  Lab Results:  Recent Labs    01/28/18 0504 01/29/18 0456  WBC 13.7* 10.3  HGB 10.8* 9.6*  HCT 33.6* 29.9*  PLT 280 206   BMET Recent Labs    01/28/18 0504 01/29/18 0456  NA 137 134*  K 3.8 3.6  CL 94* 96*  CO2 32 26  GLUCOSE 144* 168*  BUN 29* 23*  CREATININE 1.05* 0.82  CALCIUM 8.4* 8.4*   PT/INR Recent Labs    01/26/18 2157 01/28/18 0504  LABPROT 30.3* 20.9*  INR 2.93 1.82   ABG Recent Labs    01/28/18 0457  PHART 7.29*  HCO3 35.1*    Studies/Results: Dg Chest Port 1 View  Result Date: 01/27/2018 CLINICAL DATA:  Acute respiratory distress EXAM: PORTABLE CHEST 1 VIEW COMPARISON:  07/19/2017, report 01/01/2018 FINDINGS: Post sternotomy changes. Left-sided pacing device with leads over the right atrium and right ventricle. Epicardial leads also noted. Valve replacement. Cardiomegaly with vascular congestion, small pleural effusions and diffuse interstitial opacities suggesting interstitial edema on chronic underlying fibro interstitial changes. Aortic atherosclerosis. No pneumothorax. IMPRESSION: 1. Cardiomegaly with vascular congestion and diffuse interstitial opacity consistent with edema superimposed on underlying chronic interstitial disease 2. Small pleural effusions. Electronically Signed   By: Donavan Foil M.D.   On: 01/27/2018 23:36     Assessment: 72 year old female with gross hematuria/clot retention following intravesical Botox injection on Monday in the setting of  elevated INR from Coumadin.  Hematuria has now resolved after fulgeration/ clot evac.  Plan: -Voiding trial this AM -If unable to void, replaced with 16 French Foley catheter and discharged home with his catheter -Appropriate for discharge from urological perspective given resolution of hematuria    LOS: 2 days    Hollice Espy 01/29/2018

## 2018-01-29 NOTE — Care Management Important Message (Signed)
Important Message  Patient Details  Name: JOYCELINE [name] MRN: [id number] Date of Birth: 11-03-1946   Medicare Important Message Given:  Yes Signed IM notice given    Katrina Stack, RN 01/29/2018, 3:32 PM

## 2018-01-29 NOTE — Care Management Note (Signed)
Case Management Note  Patient Details  Name: Betty [name] MRN: [id number] Date of Birth: July 28, 1946  Subjective/Objective:                 Admitted from home with hematuria after receiving a botox injection for overactive bladder.  Admitted to ICU stepdown for continuous bladder irrigation.  Patient on chronic coumadin.  Informed by attending patient to discharge within next 24 hours and need to anticipate Lovenox injections to bridge coumadin.  She has had [facility] home [facility] in the past and would request that agency if needs home health.  Patient informed CM that physical therapy has recommended SNF. Received verbal confirmation of this but assessment has not been entered yet   Action/Plan:  Will obtain information on copay of lovenox.  Heads up referral to [facility] in the event does not receive approval for SNF   Expected Discharge Date:  01/30/18               Expected Discharge Plan:     In-House Referral:     Discharge planning Services     Post Acute Care Choice:    Choice offered to:     DME Arranged:    DME Agency:     HH Arranged:    HH Agency:     Status of Service:     If discussed at [employer], dates discussed:    Additional Comments:  Katrina Stack, RN 01/29/2018, 3:16 PM

## 2018-01-29 NOTE — Progress Notes (Signed)
[facility] Critical Care Medicine Progess Note    SYNOPSIS   Betty [name] is a 72 year old female who was admitted after receiving Botox injection into her bladder for bladder spasms with subsequent postinjection hemorrhage requiring continuous bladder irrigation  ASSESSMENT/PLAN   Patient is doing well.  Status post coagulation of bleeding vessel.  No further additional bleeding noted.  Stable hemodynamics.  Appreciate urology's assistance, will transfer to the floor.  Multiple pulmonary manifestations to include history of ANCA related pulmonary alveolar hemorrhage, chronic obstructive pulmonary disease, pulmonary fibrosis, obstructive sleep apnea, DVT/pulmonary emboli.  Was on systemic anticoagulation, per urology timing for reinstitution of anticoagulation.  History of atrial fibrillation.  In sinus rhythm  Diastolic heart dysfunction.  Stable  Patient is stable for floor transfer  INTAKE / OUTPUT:  Intake/Output Summary (Last 24 hours) at 01/29/2018 0841 Last data filed at 01/29/2018 0600 Gross per 24 hour  Intake 11220 ml  Output 11175 ml  Net 45 ml    Name: Betty [name] MRN: [id number] DOB: 14-Jan-1946    ADMISSION DATE:  01/26/2018   SUBJECTIVE:   Patient status post cystoscopy yesterday with coagulation of bleeding vessel and evacuation of clot.  No further evidence of bleeding noted.  Patient has remained hemodynamically stable with stable respiratory status.  More auto titrating CPAP for home diagnosis of obstructive sleep apnea.  VITAL SIGNS: Temp:  [98.2 F (36.8 C)-99 F (37.2 C)] 99 F (37.2 C) (02/07 0200) Pulse Rate:  [69-99] 73 (02/07 0600) Resp:  [12-27] 18 (02/07 0600) BP: (75-127)/(43-93) 114/54 (02/07 0600) SpO2:  [88 %-100 %] 99 % (02/07 0600)   PHYSICAL EXAMINATION: Physical Examination:   VS: BP (!) 114/54   Pulse 73   Temp 99 F (37.2 C) (Oral)   Resp 18   Ht 5\' 9"  (1.753 m)   Wt 222 lb 0.1 oz (100.7 kg)   SpO2 99%   BMI 32.78 kg/m     General Appearance: No distress  Neuro:without focal findings, mental status normal. HEENT: PERRLA, EOM intact. Pulmonary: normal breath sounds   CardiovascularNormal S1,S2.  No m/r/g.   Abdomen: Benign, Soft, non-tender. Skin:   warm, no rashes, no ecchymosis  Extremities: normal, no cyanosis, clubbing.    LABORATORY PANEL:   CBC Recent Labs  Lab 01/29/18 0456  WBC 10.3  HGB 9.6*  HCT 29.9*  PLT 206    Chemistries  Recent Labs  Lab 01/26/18 2157  01/29/18 0456  NA 134*   < > 134*  K 4.1   < > 3.6  CL 94*   < > 96*  CO2 29   < > 26  GLUCOSE 190*   < > 168*  BUN 33*   < > 23*  CREATININE 1.17*   < > 0.82  CALCIUM 8.6*   < > 8.4*  AST 39  --   --   ALT 17  --   --   ALKPHOS 96  --   --   BILITOT 0.8  --   --    < > = values in this interval not displayed.    Recent Labs  Lab 01/28/18 0750 01/28/18 1055 01/28/18 1235 01/28/18 1657 01/28/18 2256 01/29/18 0739  GLUCAP 145* 136* 118* 112* 147* 176*   Recent Labs  Lab 01/28/18 0457  PHART 7.29*  PCO2ART 73*  PO2ART 58*   Recent Labs  Lab 01/26/18 2157  AST 39  ALT 17  ALKPHOS 96  BILITOT 0.8  ALBUMIN 3.9  Cardiac Enzymes No results for input(s): TROPONINI in the last 168 hours.  RADIOLOGY:  Dg Chest Port 1 View  Result Date: 01/27/2018 CLINICAL DATA:  Acute respiratory distress EXAM: PORTABLE CHEST 1 VIEW COMPARISON:  07/19/2017, report 01/01/2018 FINDINGS: Post sternotomy changes. Left-sided pacing device with leads over the right atrium and right ventricle. Epicardial leads also noted. Valve replacement. Cardiomegaly with vascular congestion, small pleural effusions and diffuse interstitial opacities suggesting interstitial edema on chronic underlying fibro interstitial changes. Aortic atherosclerosis. No pneumothorax. IMPRESSION: 1. Cardiomegaly with vascular congestion and diffuse interstitial opacity consistent with edema superimposed on underlying chronic interstitial disease 2. Small  pleural effusions. Electronically Signed   By: Donavan Foil M.D.   On: 01/27/2018 23:36    2/7/2019Patient ID: Betty [name], female   DOB: 11/08/1946, 72 y.o.   MRN: [id number]

## 2018-01-29 NOTE — Progress Notes (Signed)
Per Dr Erlene Quan remove urinary catheter; replace with regular 16 Fr foley if pt does not urinate w/i 8 hours

## 2018-01-30 LAB — URINALYSIS, COMPLETE (UACMP) WITH MICROSCOPIC
BACTERIA UA: NONE SEEN
BILIRUBIN URINE: NEGATIVE
GLUCOSE, UA: NEGATIVE mg/dL
KETONES UR: NEGATIVE mg/dL
NITRITE: NEGATIVE
PROTEIN: 100 mg/dL — AB
Specific Gravity, Urine: 1.017 (ref 1.005–1.030)
pH: 5 (ref 5.0–8.0)

## 2018-01-30 LAB — CBC
HCT: 28 % — ABNORMAL LOW (ref 35.0–47.0)
HEMATOCRIT: 28.4 % — AB (ref 35.0–47.0)
HEMOGLOBIN: 9.1 g/dL — AB (ref 12.0–16.0)
Hemoglobin: 9 g/dL — ABNORMAL LOW (ref 12.0–16.0)
MCH: 28.7 pg (ref 26.0–34.0)
MCH: 28.8 pg (ref 26.0–34.0)
MCHC: 32.2 g/dL (ref 32.0–36.0)
MCHC: 32.2 g/dL (ref 32.0–36.0)
MCV: 89.3 fL (ref 80.0–100.0)
MCV: 89.5 fL (ref 80.0–100.0)
PLATELETS: 202 10*3/uL (ref 150–440)
Platelets: 205 10*3/uL (ref 150–440)
RBC: 3.18 MIL/uL — ABNORMAL LOW (ref 3.80–5.20)
RBC: 3.13 MIL/uL — ABNORMAL LOW (ref 3.80–5.20)
RDW: 20.2 % — AB (ref 11.5–14.5)
RDW: 20 % — AB (ref 11.5–14.5)
WBC: 9.9 10*3/uL (ref 3.6–11.0)
WBC: 9 10*3/uL (ref 3.6–11.0)

## 2018-01-30 LAB — GLUCOSE, CAPILLARY
GLUCOSE-CAPILLARY: 159 mg/dL — AB (ref 65–99)
Glucose-Capillary: 150 mg/dL — ABNORMAL HIGH (ref 65–99)
Glucose-Capillary: 121 mg/dL — ABNORMAL HIGH (ref 65–99)
Glucose-Capillary: 143 mg/dL — ABNORMAL HIGH (ref 65–99)

## 2018-01-30 LAB — PROTIME-INR
INR: 1.22
Prothrombin Time: 15.3 seconds — ABNORMAL HIGH (ref 11.4–15.2)

## 2018-01-30 MED ORDER — GUAIFENESIN-DM 100-10 MG/5ML PO SYRP
5.0000 mL | ORAL_SOLUTION | ORAL | Status: DC | PRN
  Administered 2018-01-30 – 2018-02-04 (×7): 5 mL via ORAL
  Filled 2018-01-30 (×7): qty 5

## 2018-01-30 MED ORDER — CEPHALEXIN 500 MG PO CAPS
500.0000 mg | ORAL_CAPSULE | Freq: Three times a day (TID) | ORAL | Status: AC
  Administered 2018-01-30 – 2018-02-01 (×7): 500 mg via ORAL
  Filled 2018-01-30 (×7): qty 1

## 2018-01-30 NOTE — Progress Notes (Signed)
Pt unable to tolerated CPap. Request it be taken off. Placed on O2 @ 2 liters [place]. Will continue to monitor and assess.

## 2018-01-30 NOTE — Clinical Social Work Note (Signed)
Clinical Social Work Assessment  Patient Details  Name: Betty [name] MRN: [id number] Date of Birth: 02/20/1946  Date of referral:  01/30/18               Reason for consult:  Facility Placement                Permission sought to share information with:  Family Supports, Customer service manager Permission granted to share information::  Yes, Verbal Permission Granted  Name::     Betty [name] [phone]   Agency::  SNF admissions  Relationship::     Contact Information:     Housing/Transportation Living arrangements for the past 2 months:  Single Family Home Source of Information:  Patient Patient Interpreter Needed:  None Criminal Activity/Legal Involvement Pertinent to Current Situation/Hospitalization:  No - Comment as needed Significant Relationships:  Adult Children Lives with:  Self Do you feel safe going back to the place where you live?  No Need for family participation in patient care:  No (Coment)  Care giving concerns:  Patient feels she needs short term rehab before she is able to return back home.   Social Worker assessment / plan:  Patient is a 72 year old female who lives alone, patient is alert and oriented x4.  Patient states she has been in rehab before and would like to go to [facility].  CSW spoke to [facility] and they will accept patient once insurance has been approved.  Patient is familiar with process of looking for placement and what to expect at SNF.  Patient was talkative, and in a positive mood.  Patient gave CSW permission to begin bed search.  Patient did not have any other questions or concerns.    Employment status:  Retired Nurse, adult PT Recommendations:  [place] / Referral to community resources:  [place]  Patient/Family's Response to care:  Patient is in agreement to going to SNF for short term rehab.  Patient/Family's Understanding of and  Emotional Response to Diagnosis, Current Treatment, and Prognosis: Patient is aware of current treatment plan and prognosis.  Emotional Assessment Appearance:  Appears stated age Attitude/Demeanor/Rapport:    Affect (typically observed):  Appropriate, Calm, Stable Orientation:  Oriented to Place, Oriented to Self, Oriented to  Time, Oriented to Situation Alcohol / Substance use:  Not Applicable Psych involvement (Current and /or in the community):  No (Comment)  Discharge Needs  Concerns to be addressed:    Readmission within the last 30 days:  No Current discharge risk:  Lack of support system Barriers to Discharge:  Ship broker, Continued Medical Work up   [facility], [place] 01/30/2018, 4:21 PM

## 2018-01-30 NOTE — NC FL2 (Signed)
[place] LEVEL OF CARE SCREENING TOOL     IDENTIFICATION  Patient Name: Betty [name] Birthdate: 07-Apr-1946 Sex: female Admission Date (Current Location): 01/26/2018  [place] and [place] Number:  Engineering geologist and Address:  [facility], [address] [place], [place] [address]      Provider Number: [id number]  Attending Physician Name and Address:  Vaughan Basta, *  Relative Name and Phone Number:  [name] [phone]     Current Level of Care: Hospital Recommended Level of Care: [place] Prior Approval Number:    Date Approved/Denied:   PASRR Number: [id number]  Discharge Plan: SNF    Current Diagnoses: Patient Active Problem List   Diagnosis Date Noted  . Hematuria 01/27/2018  . Carotid stenosis 12/23/2017  . Varicose veins of both lower extremities with pain 11/17/2017  . Leg pain 07/14/2017  . Chronic venous insufficiency 07/14/2017  . PAD (peripheral artery disease) ([name]) 07/14/2017  . Postprocedural hemorrhage due to complication of oral surgery 03/27/2017  . Acute blood loss anemia 03/27/2017  . H/O mitral valve replacement with mechanical valve 03/27/2017  . Syncope 06/25/2016  . UTI (lower urinary tract infection) 06/25/2016  . A-fib ([place]) 05/10/2016  . Dyspnea 05/10/2016  . Acute diastolic CHF (congestive heart failure) ([place]) 05/10/2016  . Anemia 05/10/2016  . Anemia 05/10/2016  . Other specified abnormal immunological findings in serum 04/24/2016  . Sepsis ([place]) 04/01/2016  . Prolonged Q-T interval on ECG 03/13/2016  . Mitral stenosis 03/13/2016  . Essential hypertension, benign 03/13/2016  . Diastolic congestive heart failure ([place]) 03/13/2016  . Interstitial lung disease ([place]) 12/29/2015  . Chronic LBP 07/26/2015  . Essential (primary) hypertension 07/26/2015  . Idiopathic insomnia 07/26/2015  . Restless leg 07/26/2015  . Obesity (BMI 30-39.9) 01/07/2015  .  Abnormal EKG 09/11/2014  . External nasal lesion 02/26/2014  . Abnormal liver function tests 01/28/2014  . Ankle pain, right 01/28/2014  . Arthralgia of ankle or foot 01/28/2014  . Urinary frequency 12/24/2013  . Hemorrhoids 12/24/2013  . Neck pain on right side 11/10/2013  . Vitamin D deficiency 09/09/2013  . Obstructive sleep apnea 05/30/2013  . Urinary incontinence 05/30/2013  . Diabetes ([place]) 05/30/2013  . History of colonic polyps 05/30/2013  . Controlled type 2 diabetes mellitus without complication ([place]) 93/90/3009  . H/O deep venous thrombosis 05/05/2013  . Depression 04/13/2009  . THYROID NODULE 04/13/2009  . 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 GERD 04/13/2009  . OSTEOARTHRITIS 04/13/2009  . BACK PAIN 04/13/2009  . HEADACHE 04/13/2009  . Gastro-esophageal reflux disease without esophagitis 04/13/2009  . Mild episode of recurrent major depressive disorder ([place]) 04/13/2009  . 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 ATRIAL FIBRILLATION 03/28/2009  . DIASTOLIC HEART FAILURE, ACUTE 03/28/2009  . VENTRICULAR HYPERTROPHY, LEFT 03/28/2009    Orientation RESPIRATION BLADDER Height & Weight     Self, Time, Situation, Place  O2(2L) Incontinent Weight: 220 lb 6.4 oz (100 kg) Height:  5\' 9"  (175.3 cm)  BEHAVIORAL SYMPTOMS/MOOD NEUROLOGICAL BOWEL NUTRITION STATUS      Continent Diet(Cardiac)  AMBULATORY STATUS COMMUNICATION OF NEEDS Skin   Limited Assist Verbally Normal                       Personal Care Assistance Level of Assistance  Feeding, Bathing, Dressing Bathing Assistance: Limited assistance Feeding assistance: Limited assistance Dressing Assistance: Limited assistance     Functional Limitations Info  Speech, Hearing, Sight Sight Info: Adequate Hearing Info: Adequate Speech Info: Adequate    SPECIAL CARE FACTORS FREQUENCY  PT (By licensed PT)     PT Frequency: 5x a week              Contractures       Additional Factors Info  Code Status, Allergies, Psychotropic, Insulin Sliding Scale Code Status Info: Full Code Allergies Info: FLECAINIDE, AMIODARONE Psychotropic Info: venlafaxine (EFFEXOR) tablet 75 mg  Insulin Sliding Scale Info: insulin aspart (novoLOG) injection 0-9 Units 3x a day with meals       Current Medications (01/30/2018):  This is the current hospital active medication list Current Facility-Administered Medications  Medication Dose Route Frequency Provider Last Rate Last Dose  . acetaminophen (TYLENOL) tablet 650 mg  650 mg Oral Q6H PRN Saundra Shelling, MD       Or  . acetaminophen (TYLENOL) suppository 650 mg  650 mg Rectal Q6H PRN Pyreddy, Pavan, MD      . atorvastatin (LIPITOR) tablet 20 mg  20 mg Oral Daily Pyreddy, Reatha Harps, MD   20 mg at 01/30/18 0837  . azaTHIOprine (IMURAN) tablet 100 mg  100 mg Oral Daily Pyreddy, Reatha Harps, MD   100 mg at 01/30/18 1062  . calcium-vitamin D (OSCAL WITH D) 500-200 MG-UNIT per tablet 1 tablet  1 tablet Oral Daily Saundra Shelling, MD   1 tablet at 01/30/18 0837  . cephALEXin (KEFLEX) capsule 500 mg  500 mg Oral Q8H Vaughan Basta, MD      . diltiazem (CARDIZEM CD) 24 hr capsule 180 mg  180 mg Oral Daily Pyreddy, Reatha Harps, MD   180 mg at 01/30/18 0837  . enoxaparin (LOVENOX) injection 100 mg  1 mg/kg Subcutaneous Q12H Vaughan Basta, MD   100 mg at 01/30/18 1257  . ferrous sulfate tablet 325 mg  325 mg Oral Daily Saundra Shelling, MD   325 mg at 01/30/18 0837  . fesoterodine (TOVIAZ) tablet 8 mg  8 mg Oral Daily Pyreddy, Reatha Harps, MD   8 mg at 01/30/18 6948  . fluticasone (FLONASE) 50 MCG/ACT nasal spray 2 spray  2 spray Each Nare Daily Pyreddy, Reatha Harps, MD   2 spray at 01/29/18 1004  . gabapentin (NEURONTIN) capsule 600 mg  600 mg Oral BID Saundra Shelling, MD   600 mg at 01/30/18 0837  . guaiFENesin (ROBITUSSIN) 100 MG/5ML solution 100 mg  5 mL Oral Q4H PRN Vaughan Basta, MD   100 mg at 01/29/18 2128  .  guaiFENesin-dextromethorphan (ROBITUSSIN DM) 100-10 MG/5ML syrup 5 mL  5 mL Oral Q4H PRN Vaughan Basta, MD      . insulin aspart (novoLOG) injection 0-9 Units  0-9 Units Subcutaneous TID WC Vaughan Basta, MD   2 Units at 01/30/18 1258  . loratadine (CLARITIN) tablet 10 mg  10 mg Oral Daily Pyreddy, Reatha Harps, MD   10 mg at 01/30/18 0837  . magnesium oxide (MAG-OX) tablet 400 mg  400 mg Oral Daily Pyreddy, Reatha Harps, MD   400 mg at 01/30/18 0837  . metFORMIN (GLUCOPHAGE) tablet 1,000 mg  1,000 mg Oral BID WC Pyreddy, Reatha Harps, MD   1,000 mg at 01/30/18 0837  . metolazone (ZAROXOLYN) tablet 2.5 mg  2.5 mg Oral Once per day on Mon Thu Pyreddy, Pavan, MD   2.5 mg at 01/29/18 5462  . morphine 2 MG/ML injection 2 mg  2 mg Intravenous Q4H PRN Saundra Shelling, MD   2 mg at 01/27/18 1826  . morphine 2 MG/ML injection 2 mg  2 mg Intravenous Once Vaughan Basta, MD      . ondansetron [facility]) tablet 4 mg  4 mg Oral Q6H PRN Saundra Shelling, MD       Or  . ondansetron (ZOFRAN) injection 4 mg  4 mg Intravenous Q6H PRN Pyreddy, Reatha Harps, MD      . opium-belladonna (B&O SUPPRETTES) 16.2-60 MG suppository 1 suppository  1 suppository Rectal Q6H PRN Hollice Espy, MD   1 suppository at 01/27/18 2138  . pantoprazole (PROTONIX) EC tablet 40 mg  40 mg Oral Daily Pyreddy, Reatha Harps, MD   40 mg at 01/30/18 0837  . potassium chloride SA (K-DUR,KLOR-CON) CR tablet 20 mEq  20 mEq Oral Daily Pyreddy, Reatha Harps, MD   20 mEq at 01/30/18 0837  . propafenone (RYTHMOL) tablet 225 mg  225 mg Oral Q12H Saundra Shelling, MD   225 mg at 01/30/18 1829  . rOPINIRole (REQUIP) tablet 4 mg  4 mg Oral QHS Saundra Shelling, MD   4 mg at 01/29/18 2127  . senna-docusate (Senokot-S) tablet 2 tablet  2 tablet Oral BID Saundra Shelling, MD   2 tablet at 01/30/18 0837  . traZODone (DESYREL) tablet 150 mg  150 mg Oral QHS PRN Awilda Bill, NP   150 mg at 01/28/18 2324  . venlafaxine (EFFEXOR) tablet 75 mg  75 mg Oral TID WC Vaughan Basta, MD   75 mg at 01/30/18 1258  . [START ON 02/02/2018] Vitamin D (Ergocalciferol) (DRISDOL) capsule 50,000 Units  50,000 Units Oral Q7 days Pyreddy, Pavan, MD      . warfarin (COUMADIN) tablet 6 mg  6 mg Oral q1800 Vaughan Basta, MD   6 mg at 01/29/18 1715  . Warfarin - Pharmacist Dosing Inpatient   Does not apply [id number] Vaughan Basta, MD         Discharge Medications: Please see discharge summary for a list of discharge medications.  Relevant Imaging Results:  Relevant Lab Results:   Additional Information SSN [id number]  Ross Ludwig, [place]

## 2018-01-30 NOTE — Progress Notes (Signed)
Talked to Dr. Bridgett Larsson about patient having hematuria, patient was here because of hematuria, was in CBI but was discontinue yesterday and not have hematuria since, now per MD to paged urologist on call.   Dr. Lovena Neighbours from urologist called back and per MD to place a 24 [place] Foley (3way) and to restart CBI and hold anticoagulant for now. And to check hemoglobin in the morning. No other concern at the moment. RN will continue to monitor.

## 2018-01-30 NOTE — Progress Notes (Signed)
[place] [facility] [place] NAME: Betty [name]    MR#:  [id number]  DATE OF BIRTH:  11-18-1946  SUBJECTIVE:  CHIEF COMPLAINT:   Chief Complaint  Patient presents with  . Hematuria     Patient was given Botox injection and bladder because of overactive bladder, she is on Coumadin for mechanical heart valve, came with hematuria since injection. On bladder irrigation, has complained of pain and spasms in her bladder and still passing blood and clots. So vitamin K oral was given to reverse her INR. Continue to have bleeding. Taken for procedure and bleeder was found in her bladder, which was stopped by fulguration. No hematuria now, have foley out and urinating fine, Asked about discharge, but pt complains of extreme weakness and lives alone, so called PT eval.  REVIEW OF SYSTEMS:  CONSTITUTIONAL: No fever, positive for fatigue or weakness.  EYES: No blurred or double vision.  EARS, NOSE, AND THROAT: No tinnitus or ear pain.  RESPIRATORY: No cough, shortness of breath, wheezing or hemoptysis.  CARDIOVASCULAR: No chest pain, orthopnea, edema.  GASTROINTESTINAL: No nausea, vomiting, diarrhea or abdominal pain.  GENITOURINARY: No dysuria,positive for hematuria.  ENDOCRINE: No polyuria, nocturia,  HEMATOLOGY: No anemia, easy bruising or bleeding SKIN: No rash or lesion. MUSCULOSKELETAL: No joint pain or arthritis.   NEUROLOGIC: No tingling, numbness, weakness.  PSYCHIATRY: No anxiety or depression.   ROS  DRUG ALLERGIES:   Allergies  Allergen Reactions  . Flecainide Shortness Of Breath and Other (See Comments)    Reaction: dizziness   . Amiodarone Other (See Comments)    Pt states that this medication causes lung bleeding.      VITALS:  Blood pressure (!) 127/58, pulse 89, temperature 98.3 F (36.8 C), temperature source Oral, resp. rate 16, height 5\' 9"  (1.753 m), weight 100 kg (220 lb 6.4 oz), SpO2 95 %.  PHYSICAL EXAMINATION:  GENERAL:  72  y.o.-year-old patient lying in the bed with no acute distress.  EYES: Pupils equal, round, reactive to light and accommodation. No scleral icterus. Extraocular muscles intact.  HEENT: Head atraumatic, normocephalic. Oropharynx and nasopharynx clear.  NECK:  Supple, no jugular venous distention. No thyroid enlargement, no tenderness.  LUNGS: Normal breath sounds bilaterally, no wheezing, rales,rhonchi or crepitation. No use of accessory muscles of respiration.  CARDIOVASCULAR: S1, S2 normal. No murmurs, rubs, or gallops.  ABDOMEN: Soft, nontender, nondistended. Bowel sounds present. No organomegaly or mass.  EXTREMITIES: No pedal edema, cyanosis, or clubbing.  NEUROLOGIC: Cranial nerves II through XII are intact. Muscle strength 4-5/5 in all extremities. Sensation intact. Gait not checked.  PSYCHIATRIC: The patient is alert and oriented x 3 SKIN: No obvious rash, lesion, or ulcer.   Physical Exam LABORATORY PANEL:   CBC Recent Labs  Lab 01/30/18 0600  WBC 9.9  HGB 9.1*  HCT 28.4*  PLT 205   ------------------------------------------------------------------------------------------------------------------  Chemistries  Recent Labs  Lab 01/26/18 2157  01/29/18 0456  NA 134*   < > 134*  K 4.1   < > 3.6  CL 94*   < > 96*  CO2 29   < > 26  GLUCOSE 190*   < > 168*  BUN 33*   < > 23*  CREATININE 1.17*   < > 0.82  CALCIUM 8.6*   < > 8.4*  AST 39  --   --   ALT 17  --   --   ALKPHOS 96  --   --  BILITOT 0.8  --   --    < > = values in this interval not displayed.   ------------------------------------------------------------------------------------------------------------------  Cardiac Enzymes No results for input(s): TROPONINI in the last 168 hours. ------------------------------------------------------------------------------------------------------------------  RADIOLOGY:  No results found.  ASSESSMENT AND PLAN:   Active Problems:   Hematuria  * Hematuria   Seen  by urologist, on continuous bladder irrigation.   Hemoglobin dropped from 13- 10.8   INR is high secondary to Coumadin, vitamin K to reverse the effect.   Cystoscopy and bladder fulguration done, Bleeding stopped now.    Foley removed, urinating, clear urine.   Urology approved starting back of anticoagulation.  * A. Fib - paroxysmal.   Rate is controlled, on Coumadin at home.   Coumadin on hold because of bleeding,    Continue Cardizem. Continue metoprolol.   S/p pacemaker.\   Resume coumadin and for bridging will give lovenox.  * Mechanical aortic valve   On Coumadin at home, currently held because of active bleeding.  Start on coumadin + lovenox.  * Interstitial lung disease   On CPAP at home, also on Imuran.   Advised to continue following as outpatient.  * Hypertension   Currently blood pressure is running low normal.   Continue home medications. IV fluids.  * Bladder spasms and pain   Due to active bleeding.   Supportive care with morphine and suppository.  * Diabetes   Continue metformin, insulin sliding scale coverage.  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  * generalized weakness    Get PT eval.  All the records are reviewed and case discussed with Care Management/Social Workerr. Management plans discussed with the patient, family and they are in agreement.  CODE STATUS: Full.  TOTAL TIME TAKING CARE OF THIS PATIENT: 35 minutes.    POSSIBLE D/C IN 1-2 DAYS, DEPENDING ON CLINICAL CONDITION.   Vaughan Basta M.D on 01/30/2018   Between 7am to 6pm - Pager - [phone]  After 6pm go to [email or web address] - password [email or web address] [facility]  Office  [phone]  CC: Primary care physician; Dion Body, MD  Note: This dictation was prepared with Dragon dictation along with smaller phrase technology. Any transcriptional errors that result from this  process are unintentional.

## 2018-01-30 NOTE — Progress Notes (Addendum)
Awaiting ED RN to help assist with the foley insertion and initiation of CBI. Patient educated and updated. Will continue to monitor.   Update: 24 french triple lumen catheter placed. Patient tolerated procedure well. Immediate return of bloody urine. Clots noted. Irrigation working without issue.   Update: Clot, about 350 cc, noted to fill entire container. Unable to empty bag due to clot occluding port. Bag changed. Will continue to monitor.   Betty [name]

## 2018-01-30 NOTE — Progress Notes (Signed)
[place] at [place] NAME: Betty [name]    MR#:  [id number]  DATE OF BIRTH:  November 20, 1946  SUBJECTIVE:  CHIEF COMPLAINT:   Chief Complaint  Patient presents with  . Hematuria     Patient was given Botox injection and bladder because of overactive bladder, she is on Coumadin for mechanical heart valve, came with hematuria since injection. On bladder irrigation, has complained of pain and spasms in her bladder and still passing blood and clots. So vitamin K oral was given to reverse her INR. Continue to have bleeding. Taken for procedure and bleeder was found in her bladder, which was stopped by fulguration. No hematuria now, have foley out and urinating fine, Asked about discharge, but pt complains of extreme weakness and lives alone, so called PT eval. Suggested SNF.  REVIEW OF SYSTEMS:  CONSTITUTIONAL: No fever, positive for fatigue or weakness.  EYES: No blurred or double vision.  EARS, NOSE, AND THROAT: No tinnitus or ear pain.  RESPIRATORY: No cough, shortness of breath, wheezing or hemoptysis.  CARDIOVASCULAR: No chest pain, orthopnea, edema.  GASTROINTESTINAL: No nausea, vomiting, diarrhea or abdominal pain.  GENITOURINARY: No dysuria,positive for hematuria.  ENDOCRINE: No polyuria, nocturia,  HEMATOLOGY: No anemia, easy bruising or bleeding SKIN: No rash or lesion. MUSCULOSKELETAL: No joint pain or arthritis.   NEUROLOGIC: No tingling, numbness, weakness.  PSYCHIATRY: No anxiety or depression.   ROS  DRUG ALLERGIES:   Allergies  Allergen Reactions  . Flecainide Shortness Of Breath and Other (See Comments)    Reaction: dizziness   . Amiodarone Other (See Comments)    Pt states that this medication causes lung bleeding.      VITALS:  Blood pressure (!) 112/57, pulse 87, temperature 98.9 F (37.2 C), temperature source Oral, resp. rate 18, height 5\' 9"  (1.753 m), weight 100 kg (220 lb 6.4 oz), SpO2 100 %.  PHYSICAL EXAMINATION:   GENERAL:  72 y.o.-year-old patient lying in the bed with no acute distress.  EYES: Pupils equal, round, reactive to light and accommodation. No scleral icterus. Extraocular muscles intact.  HEENT: Head atraumatic, normocephalic. Oropharynx and nasopharynx clear.  NECK:  Supple, no jugular venous distention. No thyroid enlargement, no tenderness.  LUNGS: Normal breath sounds bilaterally, no wheezing, rales,rhonchi or crepitation. No use of accessory muscles of respiration.  CARDIOVASCULAR: S1, S2 normal. No murmurs, rubs, or gallops.  ABDOMEN: Soft, nontender, nondistended. Bowel sounds present. No organomegaly or mass.  EXTREMITIES: No pedal edema, cyanosis, or clubbing.  NEUROLOGIC: Cranial nerves II through XII are intact. Muscle strength 4-5/5 in all extremities. Sensation intact. Gait not checked.  PSYCHIATRIC: The patient is alert and oriented x 3 SKIN: No obvious rash, lesion, or ulcer.   Physical Exam LABORATORY PANEL:   CBC Recent Labs  Lab 01/30/18 0600  WBC 9.9  HGB 9.1*  HCT 28.4*  PLT 205   ------------------------------------------------------------------------------------------------------------------  Chemistries  Recent Labs  Lab 01/26/18 2157  01/29/18 0456  NA 134*   < > 134*  K 4.1   < > 3.6  CL 94*   < > 96*  CO2 29   < > 26  GLUCOSE 190*   < > 168*  BUN 33*   < > 23*  CREATININE 1.17*   < > 0.82  CALCIUM 8.6*   < > 8.4*  AST 39  --   --   ALT 17  --   --   ALKPHOS 96  --   --  BILITOT 0.8  --   --    < > = values in this interval not displayed.   ------------------------------------------------------------------------------------------------------------------  Cardiac Enzymes No results for input(s): TROPONINI in the last 168 hours. ------------------------------------------------------------------------------------------------------------------  RADIOLOGY:  No results found.  ASSESSMENT AND PLAN:   Active Problems:   Hematuria  *  Hematuria   Seen by urologist, on continuous bladder irrigation.   Hemoglobin dropped from 13- 10.8- 9.1   INR was high secondary to Coumadin, vitamin K given to reverse the effect.   Cystoscopy and bladder fulguration done, Bleeding stopped now.    Foley removed, urinating, clear but dark colour urine.   Urology approved starting back of anticoagulation.  * A. Fib - paroxysmal.   Rate is controlled, on Coumadin at home.   Coumadin on hold because of bleeding,    Continue Cardizem. Continue metoprolol.   S/p pacemaker.   Resume coumadin and for bridging will give lovenox.  * Mechanical aortic valve   On Coumadin at home, Initially held because of active bleeding.  Started on coumadin + lovenox.  * Interstitial lung disease   On CPAP at home, also on Imuran.   Advised to continue following as outpatient.  * Hypertension   Currently blood pressure is running low normal.    IV fluids.   Held diuretics and metoprolol, BP stable. May need to stop on d/c.  * Bladder spasms and pain   Due to active bleeding.   Supportive care with morphine and suppository.  * Diabetes   Continue metformin, insulin sliding scale coverage.  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   Held diuretics.  * generalized weakness   SNF per PT eval. Awaited Approval by insurance company.  All the records are reviewed and case discussed with Care Management/Social Workerr. Management plans discussed with the patient, family and they are in agreement.  CODE STATUS: Full.  TOTAL TIME TAKING CARE OF THIS PATIENT: 35 minutes.    POSSIBLE D/C IN 1-2 DAYS, DEPENDING ON CLINICAL CONDITION.   Vaughan Basta M.D on 01/30/2018   Between 7am to 6pm - Pager - [phone]  After 6pm go to [email or web address] - password [email or web address] [facility]  Office  [phone]  CC: Primary care physician; Dion Body,  MD  Note: This dictation was prepared with Dragon dictation along with smaller phrase technology. Any transcriptional errors that result from this process are unintentional.

## 2018-01-30 NOTE — Progress Notes (Signed)
ANTICOAGULATION CONSULT NOTE - Initial Consult  Pharmacy Consult for enoxaparin and warfarin Indication: history of mechanical heart valve - bridge to therapeutic INR  Allergies  Allergen Reactions  . Flecainide Shortness Of Breath and Other (See Comments)    Reaction: dizziness   . Amiodarone Other (See Comments)    Pt states that this medication causes lung bleeding.      Patient Measurements: Height: 5\' 9"  (175.3 cm) Weight: 220 lb 6.4 oz (100 kg) IBW/kg (Calculated) : 66.2 Heparin Dosing Weight:   Vital Signs: Temp: 98.3 F (36.8 C) (02/08 0438) Temp Source: Oral (02/08 0438) BP: 127/58 (02/08 0438) Pulse Rate: 89 (02/08 0438)  Labs: Recent Labs    01/27/18 0718  01/28/18 0504 01/29/18 0456 01/29/18 1416 01/30/18 0600  HGB 13.0   < > 10.8* 9.6*  --  9.1*  HCT 40.8   < > 33.6* 29.9*  --  28.4*  PLT 243  --  280 206  --  205  LABPROT  --   --  20.9*  --  15.6* 15.3*  INR  --   --  1.82  --  1.25 1.22  CREATININE 0.87  --  1.05* 0.82  --   --    < > = values in this interval not displayed.    Estimated Creatinine Clearance: 79.2 mL/min (by C-G formula based on SCr of 0.82 mg/dL).   Medical History: Past Medical History:  Diagnosis Date  . Acute diastolic heart failure ([place])   . Allergy   . 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 Asthma   . Atrial fibrillation ([place])   . Backache, unspecified   . Cardiomegaly   . COPD (chronic obstructive pulmonary disease) ([place])   . Diabetes mellitus without complication ([place])   . 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 Esophageal reflux   . Headache(784.0)   . Herpes zoster without mention of complication   . Hx: UTI (urinary tract infection)   . Hypertension    heart controlled w CHF  . Nontoxic uninodular goiter   . Obesity, unspecified   . Osteoarthrosis, unspecified whether generalized or localized, unspecified site   . Unspecified sleep apnea   . Urine incontinence    hx of     Medications:  Infusions:  . ceFEPime (MAXIPIME) IV Stopped (01/30/18 0100)    Assessment: 71 yof cc hematuria after botox bladder injection for OAB. Warfarin PTA for AF mechanical valve was reversed with oral vitamin K. Now pharmacy consulted to dose LMWH to bridge to therapeutic INR post cystoscopy.   Goal of Therapy:  INR 2.5 to 3.5 due to mechanical mitral valve Monitor platelets by anticoagulation protocol: Yes   Plan:  1. Enoxaparin 1 mg/kg (100 mg) subcutaneously twice daily until INR therapeutic x 2. 2. Check INR this afternoon, then resume warfarin 6 mg po daily. Daily INRs until therapeutic x 2.   01/30/2018 06:00 INR 1.22. Continue 6 mg po daily. Pharmacy will continue to follow and adjust as needed to maintain INR 2.5 to 3.5.   Laural Benes, Pharm.D., BCPS Clinical Pharmacist 01/30/2018,7:07 AM

## 2018-01-31 ENCOUNTER — Inpatient Hospital Stay: Payer: Medicare Other

## 2018-01-31 DIAGNOSIS — R310 Gross hematuria: Secondary | ICD-10-CM

## 2018-01-31 DIAGNOSIS — N3281 Overactive bladder: Secondary | ICD-10-CM

## 2018-01-31 DIAGNOSIS — I480 Paroxysmal atrial fibrillation: Secondary | ICD-10-CM

## 2018-01-31 DIAGNOSIS — J9601 Acute respiratory failure with hypoxia: Secondary | ICD-10-CM

## 2018-01-31 DIAGNOSIS — N3289 Other specified disorders of bladder: Secondary | ICD-10-CM

## 2018-01-31 DIAGNOSIS — Z952 Presence of prosthetic heart valve: Secondary | ICD-10-CM

## 2018-01-31 DIAGNOSIS — Z86711 Personal history of pulmonary embolism: Secondary | ICD-10-CM

## 2018-01-31 DIAGNOSIS — I5032 Chronic diastolic (congestive) heart failure: Secondary | ICD-10-CM

## 2018-01-31 LAB — BASIC METABOLIC PANEL
ANION GAP: 11 (ref 5–15)
BUN: 14 mg/dL (ref 6–20)
CHLORIDE: 96 mmol/L — AB (ref 101–111)
CO2: 31 mmol/L (ref 22–32)
Calcium: 8.6 mg/dL — ABNORMAL LOW (ref 8.9–10.3)
Creatinine, Ser: 0.66 mg/dL (ref 0.44–1.00)
GFR calc non Af Amer: >60 mL/min (ref >60)
Glucose, Bld: 134 mg/dL — ABNORMAL HIGH (ref 65–99)
Potassium: 3.3 mmol/L — ABNORMAL LOW (ref 3.5–5.1)
Sodium: 138 mmol/L (ref 135–145)

## 2018-01-31 LAB — GLUCOSE, CAPILLARY
GLUCOSE-CAPILLARY: 113 mg/dL — AB (ref 65–99)
GLUCOSE-CAPILLARY: 118 mg/dL — AB (ref 65–99)
GLUCOSE-CAPILLARY: 87 mg/dL (ref 65–99)
Glucose-Capillary: 123 mg/dL — ABNORMAL HIGH (ref 65–99)
Glucose-Capillary: 128 mg/dL — ABNORMAL HIGH (ref 65–99)

## 2018-01-31 LAB — CBC
HCT: 28 % — ABNORMAL LOW (ref 35.0–47.0)
Hemoglobin: 9 g/dL — ABNORMAL LOW (ref 12.0–16.0)
MCH: 28.7 pg (ref 26.0–34.0)
MCHC: 32.3 g/dL (ref 32.0–36.0)
MCV: 89 fL (ref 80.0–100.0)
PLATELETS: 218 10*3/uL (ref 150–440)
RBC: 3.15 MIL/uL — ABNORMAL LOW (ref 3.80–5.20)
RDW: 19.7 % — AB (ref 11.5–14.5)
WBC: 8.5 10*3/uL (ref 3.6–11.0)

## 2018-01-31 LAB — PROTIME-INR
INR: 1.3
Prothrombin Time: 16.1 seconds — ABNORMAL HIGH (ref 11.4–15.2)

## 2018-01-31 MED ORDER — TORSEMIDE 20 MG PO TABS
80.0000 mg | ORAL_TABLET | Freq: Every day | ORAL | Status: DC
  Administered 2018-01-31 – 2018-02-03 (×4): 80 mg via ORAL
  Filled 2018-01-31 (×4): qty 4

## 2018-01-31 MED ORDER — WARFARIN - PHARMACIST DOSING INPATIENT: Freq: Every day | Status: DC

## 2018-01-31 MED ORDER — BELLADONNA ALKALOIDS-OPIUM 16.2-60 MG RE SUPP
1.0000 | Freq: Once | RECTAL | Status: AC
  Administered 2018-01-31: 1 via RECTAL
  Filled 2018-01-31: qty 1

## 2018-01-31 MED ORDER — OXYBUTYNIN CHLORIDE 5 MG PO TABS
5.0000 mg | ORAL_TABLET | Freq: Three times a day (TID) | ORAL | Status: DC | PRN
  Administered 2018-01-31: 5 mg via ORAL
  Filled 2018-01-31: qty 1

## 2018-01-31 MED ORDER — FUROSEMIDE 10 MG/ML IJ SOLN
40.0000 mg | Freq: Once | INTRAMUSCULAR | Status: AC
  Administered 2018-01-31: 40 mg via INTRAVENOUS
  Filled 2018-01-31: qty 4

## 2018-01-31 MED ORDER — DIPHENHYDRAMINE HCL 25 MG PO CAPS
25.0000 mg | ORAL_CAPSULE | Freq: Three times a day (TID) | ORAL | Status: DC | PRN
  Administered 2018-01-31 – 2018-02-01 (×3): 25 mg via ORAL
  Filled 2018-01-31 (×3): qty 1

## 2018-01-31 NOTE — Progress Notes (Signed)
3 Days Post-Op Subjective: Her urine is clear this AM after 50F Foley insertion and CBI overnight.  Currently holding coumadin.  I hand irrigated her catheter several times and did not get any clots back.  H/H stable this AM. Objective: Vital signs in last 24 hours: Temp:  [97.9 F (36.6 C)-98.9 F (37.2 C)] 97.9 F (36.6 C) (02/09 0348) Pulse Rate:  [83-91] 91 (02/09 0810) Resp:  [14-20] 14 (02/09 0810) BP: (112-121)/(57-71) 121/60 (02/09 0810) SpO2:  [85 %-100 %] 99 % (02/09 0810) Weight:  [99.1 kg (218 lb 8 oz)] 99.1 kg (218 lb 8 oz) (02/09 0348)  Intake/Output from previous day: 02/08 0701 - 02/09 0700 In: 18900  Out: 13300 [Urine:13300]  Intake/Output this shift: Total I/O In: -  Out: 2950 [Urine:2950]  Physical Exam:  General: Alert and oriented CV: RRR, palpable distal pulses Lungs: CTAB, equal chest rise Abdomen: Soft, NTND, no rebound or guarding Ext: NT, No erythema GU: 24 F 3-way catheter in place and draining clear irrigant Lab Results: Recent Labs    01/30/18 0600 01/30/18 2122 01/31/18 0559  HGB 9.1* 9.0* 9.0*  HCT 28.4* 28.0* 28.0*   BMET Recent Labs    01/29/18 0456 01/31/18 0558  NA 134* 138  K 3.6 3.3*  CL 96* 96*  CO2 26 31  GLUCOSE 168* 134*  BUN 23* 14  CREATININE 0.82 0.66  CALCIUM 8.4* 8.6*     Studies/Results: Dg Chest Port 1 View  Result Date: 01/31/2018 CLINICAL DATA:  Acute onset of cough. EXAM: PORTABLE CHEST 1 VIEW COMPARISON:  Chest radiograph performed 01/27/2018 FINDINGS: Small bilateral pleural effusions are noted. Diffusely increased interstitial markings raise concern for pulmonary edema. No pneumothorax is seen. The cardiomediastinal silhouette is mildly enlarged. The patient is status post median sternotomy. A valve replacement is noted. A pacemaker is noted overlying the left chest wall, with leads ending overlying the right atrium and right ventricle. No acute osseous abnormalities are identified. IMPRESSION: 1. Small  bilateral pleural effusions. Diffusely increased interstitial markings raise concern for pulmonary edema. 2. Mild cardiomegaly. Electronically Signed   By: Garald Balding M.D.   On: 01/31/2018 02:34    Assessment/Plan: Gross hematuria following office botox injection on 01/26/18 Blood loss anemia- History of Diastolic heart failure-hx of mechanical heart valve  -Her hematuria appears to be improving with Foley catheterization, CBI and holding her coumadin.  CBI prn hematuria/clots (ok to titrate as needed).  Hand irrigate Foley catheter PRN.  Continue to hold coumadin for another 24 hours. INR was 1.3 this AM.  No plans for repeat cysto/clot evac at this point.  Will continue to monitor.      LOS: 4 days   Ellison Hughs, MD 01/31/2018, 9:49 AM  [facility] Pager: [phone]

## 2018-01-31 NOTE — Progress Notes (Signed)
Urine was clear however had turned red. Continuous bladder irrigation turned slightly will continue to monitor closely

## 2018-01-31 NOTE — Plan of Care (Signed)
  Progressing Education: Knowledge of General Education information will improve 01/31/2018 0306 - Progressing by Loran Senters, RN Activity: Risk for activity intolerance will decrease 01/31/2018 0306 - Progressing by Loran Senters, RN Safety: Ability to remain free from injury will improve 01/31/2018 0306 - Progressing by Loran Senters, RN

## 2018-01-31 NOTE — Consult Note (Signed)
Cardiology Consultation:   Patient ID: Betty [name]; [id number]; November 13, 1946   Admit date: 01/26/2018 Date of Consult: 01/31/2018  Primary Care Provider: Dion Body, MD Primary Cardiologist: Vena Austria Primary Electrophysiologist: Mylinda Latina  Patient Profile:   Cardiology consult placed by Dr.  Bridgett Larsson for hematuria and management of anticoagulation in the setting of mechanical mitral valve  EKG lab work, chest x-ray reviewed independently by myself  Betty [name] is a 72 y.o. 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 Vikki Ports for overactive bladder who presented with hematuria, bladder spasms, passing clots    History of Present Illness:   Initially admitted January 26 2018 She underwent urologic procedure January 28, 2018 which stopped the bleeding Bleeding has restarted as anticoagulation restarted Was on Lovenox with warfarin bridging, received 3 doses of Lovenox, last Lovenox dose was 1 PM February 8 Anticoagulation has been held , INR 1.3  Hemoglobin 9.0, white count 8.5, platelets 218, INR 1.3, creatinine 0.66 BUN 14 potassium 3.3  No EKG available for review Telemetry reviewed showing normal sinus rhythm  Outpatient records reviewed, maintained on torsemide 80 twice daily, low-dose Aldactone, periodic metolazone, on pro-path known for atrial   Past Medical History:  Diagnosis Date  . Acute diastolic heart failure ([place])   . Allergy   . ANCA-associated vasculitis ([place])   . Asthma   . Atrial fibrillation ([place])   . Backache, unspecified   . Cardiomegaly   . COPD (chronic obstructive pulmonary disease) ([place])   . Diabetes mellitus without complication ([place])   .  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 Esophageal reflux   . Headache(784.0)   . Herpes zoster without mention of complication   . Hx: UTI (urinary tract infection)   . Hypertension    heart controlled w CHF  . Nontoxic uninodular goiter   . Obesity, unspecified   . Osteoarthrosis, unspecified whether generalized or localized, unspecified site   . Unspecified sleep apnea   . Urine incontinence    hx of    Past Surgical History:  Procedure Laterality Date  . ABDOMINAL HYSTERECTOMY  1979   complete (for precancerous cells)  . ABLATION  2011 & 2014  . bladder botox  01/26/2018  . CHOLECYSTECTOMY    . CYSTOSCOPY WITH FULGERATION N/A 01/28/2018   Procedure: [place] AND CLOT EVACUATION;  Surgeon: Hollice Espy, MD;  Location: [facility] ORS;  Service: Urology;  Laterality: N/A;  . Interstim Placement  2012  . OOPHORECTOMY       Home Medications:  Prior to Admission medications   Medication Sig Start Date End Date Taking? Authorizing Provider  albuterol (PROVENTIL HFA;VENTOLIN HFA) 108 (90 Base) MCG/ACT inhaler Inhale 2 puffs into the lungs every 4 (four) hours as needed for wheezing or shortness of breath. 01/17/16  Yes Kasa, Maretta Bees, MD  albuterol (PROVENTIL) (2.5 MG/3ML) 0.083% nebulizer solution Take 3 mLs (2.5 mg total) by nebulization every 4 (four) hours as needed for wheezing or shortness of breath. 06/28/16  Yes Gladstone Lighter, MD  aspirin EC 81 MG tablet Take 81 mg by mouth daily.   Yes [provider]  atorvastatin (LIPITOR) 20 MG tablet Take 20 mg by mouth daily.   Yes [provider]  azaTHIOprine (  IMURAN) 50 MG tablet Take 100 mg by mouth daily.    Yes [provider]  ferrous sulfate 325 (65 FE) MG EC tablet Take 325 mg by mouth daily.  06/23/17  Yes [provider]  gabapentin (NEURONTIN) 600 MG tablet Take 600 mg by mouth 2 (two) times daily.   Yes [provider]  loratadine  (CLARITIN) 10 MG tablet Take 10 mg by mouth daily. 06/03/17  Yes [provider]  magnesium oxide (MAG-OX) 400 (241.3 Mg) MG tablet TAKE 1 TABLET (400 MG TOTAL) BY MOUTH ONCE DAILY. 06/04/17  Yes [provider]  metFORMIN (GLUCOPHAGE) 1000 MG tablet Take 1,000 mg by mouth 2 (two) times daily with a meal.    Yes [provider]  metolazone (ZAROXOLYN) 2.5 MG tablet Take 2.5 mg by mouth 2 (two) times a week. Take 2.5 mg by mouth on Monday and Thursday.   Yes [provider]  metoprolol succinate (TOPROL-XL) 25 MG 24 hr tablet Take 25 mg by mouth daily. 06/23/17  Yes [provider]  pantoprazole (PROTONIX) 40 MG tablet Take 40 mg by mouth daily.   Yes [provider]  potassium chloride SA (K-DUR,KLOR-CON) 20 MEQ tablet Take 20 mEq by mouth daily. 06/27/17  Yes [provider]  propafenone (RYTHMOL) 225 MG tablet Take 225 mg by mouth every 12 (twelve) hours.   Yes [provider]  rOPINIRole (REQUIP) 4 MG tablet Take 4 mg by mouth at bedtime. 07/02/17  Yes [provider]  senna-docusate (SENNA-PLUS) 8.6-50 MG tablet Take 2 tablets by mouth 2 (two) times daily.    Yes [provider]  spironolactone (ALDACTONE) 50 MG tablet Take 25 mg by mouth daily. At noon   Yes [provider]  torsemide (DEMADEX) 20 MG tablet Take 80 mg by mouth 2 (two) times daily.    Yes [provider]  traZODone (DESYREL) 100 MG tablet Take 200 mg by mouth at bedtime.   Yes [provider]  trimethoprim (TRIMPEX) 100 MG tablet Take 1 tablet (100 mg total) by mouth daily. 11/24/17  Yes MacDiarmid, Nicki Reaper, MD  venlafaxine (EFFEXOR) 75 MG tablet Take 75 mg by mouth 3 (three) times daily. 06/24/17  Yes [provider]  Vitamin D, Ergocalciferol, (DRISDOL) 50000 units CAPS capsule Take 50,000 Units by mouth every 7 (seven) days. Mondays only   Yes [provider]  warfarin (COUMADIN) 6 MG tablet Take 6 mg by  mouth See admin instructions. Patient takes 6 mg on Saturday and Sunday 07/09/17 07/09/18 Yes [provider]  calcium-vitamin D (OSCAL WITH D) 500-200 MG-UNIT per tablet Take 1 tablet by mouth daily.    [provider]  ciprofloxacin (CIPRO) 250 MG tablet Take 1 tablet (250 mg total) by mouth daily. Take 1 tablet the day before and the day of and the day after procedure. Patient not taking: Reported on 01/27/2018 01/05/18   Bjorn Loser, MD  diltiazem (CARDIZEM CD) 180 MG 24 hr capsule Take 180 mg by mouth daily. 06/23/17   [provider]  fesoterodine (TOVIAZ) 8 MG TB24 tablet Take 1 tablet (8 mg total) by mouth daily. Patient not taking: Reported on 01/05/2018 07/21/17   Zara Council A, PA-C  fluticasone (FLONASE) 50 MCG/ACT nasal spray Place 2 sprays into the nose daily.  06/03/17 06/03/18  [provider]  Lancets 30G MISC Use 1 Units as directed. Check CBG's fasting once daily. Dx: E11.9 07/26/15   [provider]  nitrofurantoin (MACRODANTIN) 100  MG capsule Take 1 capsule (100 mg total) by mouth 2 (two) times daily. Patient not taking: Reported on 01/27/2018 01/19/18   Bjorn Loser, MD  oxybutynin (DITROPAN) 5 MG tablet Take 5 mg by mouth 3 (three) times daily.     [provider]  warfarin (COUMADIN) 5 MG tablet Take 5 mg by mouth See admin instructions. Take 5 mg everyday except Saturday and Sunday    [provider]    Inpatient Medications: Scheduled Meds: . atorvastatin  20 mg Oral Daily  . azaTHIOprine  100 mg Oral Daily  . calcium-vitamin D  1 tablet Oral Daily  . cephALEXin  500 mg Oral Q8H  . diltiazem  180 mg Oral Daily  . ferrous sulfate  325 mg Oral Daily  . fesoterodine  8 mg Oral Daily  . fluticasone  2 spray Each Nare Daily  . gabapentin  600 mg Oral BID  . insulin aspart  0-9 Units Subcutaneous TID WC  . loratadine  10 mg Oral Daily  . magnesium oxide  400 mg Oral Daily  . metFORMIN  1,000 mg Oral BID WC   . metolazone  2.5 mg Oral Once per day on Mon Thu  .  morphine injection  2 mg Intravenous Once  . pantoprazole  40 mg Oral Daily  . potassium chloride SA  20 mEq Oral Daily  . propafenone  225 mg Oral Q12H  . rOPINIRole  4 mg Oral QHS  . senna-docusate  2 tablet Oral BID  . venlafaxine  75 mg Oral TID WC  . [START ON 02/02/2018] Vitamin D (Ergocalciferol)  50,000 Units Oral Q7 days  . [START ON 02/01/2018] Warfarin - Pharmacist Dosing Inpatient   Does not apply q1800   Continuous Infusions:  PRN Meds: acetaminophen **OR** acetaminophen, guaiFENesin, guaiFENesin-dextromethorphan, morphine injection, ondansetron **OR** ondansetron (ZOFRAN) IV, opium-belladonna, traZODone  Allergies:    Allergies  Allergen Reactions  . Flecainide Shortness Of Breath and Other (See Comments)    Reaction: dizziness   . Amiodarone Other (See Comments)    Pt states that this medication causes lung bleeding.      Social History:   Social History   Socioeconomic History  . Marital status: Widowed    Spouse name: Not on file  . Number of children: 1  . Years of education: Not on file  . Highest education level: Not on file  Social Needs  . Financial resource strain: Not on file  . Food insecurity - worry: Not on file  . Food insecurity - inability: Not on file  . Transportation needs - medical: Not on file  . Transportation needs - non-medical: Not on file  Occupational History    Employer: [employer]  Tobacco Use  . Smoking status: Former Smoker    Packs/day: 0.50    Years: 15.00    Pack years: 7.50    Types: Cigarettes  . Smokeless tobacco: Never Used  . Tobacco comment: Has a 20-pack-year history, qutting in 1970.   Substance and Sexual Activity  . Alcohol use: No    Alcohol/week: 0.0 oz  . Drug use: No  . Sexual activity: Not Currently  Other Topics Concern  . Not on file  Social History Narrative   Lives in [place] with her husband. 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 Does not routinely exercise.     Family History:    Family History  Problem Relation Age of  Onset  . Heart attack Father   . Heart failure Father   . Arthritis Father   . Stroke Father   . Hypertension Father   . Coronary artery disease Brother   . Peripheral vascular disease Brother   . Arthritis Mother   . Colon cancer Mother        colon cancer  . Hypertension Mother   . Cancer Maternal Grandmother        colon cancer  . Arthritis Maternal Grandmother      ROS:  Please see the history of present illness.  Review of Systems  Constitutional: Positive for malaise/fatigue.  Respiratory: Positive for shortness of breath.   Cardiovascular: Negative.   Gastrointestinal: Negative.   Genitourinary: Positive for hematuria.  Musculoskeletal: Negative.   Neurological: Positive for weakness.  Psychiatric/Behavioral: Negative.   All other systems reviewed and are negative.    Physical Exam/Data:   Vitals:   01/31/18 0141 01/31/18 0149 01/31/18 0348 01/31/18 0810  BP:  118/71 115/63 121/60  Pulse:  88 90 91  Resp:  20 18 14   Temp:  98.3 F (36.8 C) 97.9 F (36.6 C)   TempSrc:  Oral Oral   SpO2: 90% 98% 100% 99%  Weight:   218 lb 8 oz (99.1 kg)   Height:        Intake/Output Summary (Last 24 hours) at 01/31/2018 1319 Last data filed at 01/31/2018 1300 Gross per 24 hour  Intake 18900 ml  Output 18700 ml  Net 200 ml   Filed Weights   01/27/18 1757 01/30/18 0448 01/31/18 0348  Weight: 222 lb 0.1 oz (100.7 kg) 220 lb 6.4 oz (100 kg) 218 lb 8 oz (99.1 kg)   Body mass index is 32.27 kg/m.  General:  Well nourished, well developed, in no acute distress, very lethargic HEENT: normal Lymph: no adenopathy Neck: no JVD Endocrine:  No thryomegaly Vascular: No carotid bruits; FA pulses 2+ bilaterally without bruits  Cardiac:  normal S1, S2; RRR; no murmur  Lungs:  clear bilaterally with mild dullness at the bases, poor  inspiratory effort,  no wheezing, rhonchi or rales  Abd: soft, nontender, no hepatomegaly  Ext: no edema Musculoskeletal:  No deformities, BUE and BLE strength normal and equal Skin: warm and dry  Neuro:  CNs 2-12 intact, no focal abnormalities noted Psych:  Normal affect   EKG:   no EKG available Telemetry:  Telemetry was personally reviewed and demonstrates: Normal sinus rhythm  Relevant CV Studies: Previous echocardiogram September 02, 2017 with ejection fraction 55%, moderately enlarged left atrium, mechanical mitral valve, severe TR right ventricular systolic pressure 35  Laboratory Data:  Chemistry Recent Labs  Lab 01/28/18 0504 01/29/18 0456 01/31/18 0558  NA 137 134* 138  K 3.8 3.6 3.3*  CL 94* 96* 96*  CO2 32 26 31  GLUCOSE 144* 168* 134*  BUN 29* 23* 14  CREATININE 1.05* 0.82 0.66  CALCIUM 8.4* 8.4* 8.6*  GFRNONAA 52* >60 >60  GFRAA >60 >60 >60  ANIONGAP 11 12 11     Recent Labs  Lab 01/26/18 2157  PROT 7.0  ALBUMIN 3.9  AST 39  ALT 17  ALKPHOS 96  BILITOT 0.8   Hematology Recent Labs  Lab 01/30/18 0600 01/30/18 2122 01/31/18 0559  WBC 9.9 9.0 8.5  RBC 3.18* 3.13* 3.15*  HGB 9.1* 9.0* 9.0*  HCT 28.4* 28.0* 28.0*  MCV 89.3 89.5 89.0  MCH 28.7 28.8 28.7  MCHC 32.2 32.2 32.3  RDW 20.2* 20.0* 19.7*  PLT 205 202 218   Cardiac EnzymesNo results for input(s): TROPONINI in the last 168 hours. No results for input(s): TROPIPOC in the last 168 hours.  BNPNo results for input(s): BNP, PROBNP in the last 168 hours.  DDimer No results for input(s): DDIMER in the last 168 hours.  Radiology/Studies:  Dg Chest Port 1 View  Result Date: 01/31/2018 CLINICAL DATA:  Acute onset of cough. EXAM: PORTABLE CHEST 1 VIEW COMPARISON:  Chest radiograph performed 01/27/2018 FINDINGS: Small bilateral pleural effusions are noted. Diffusely increased interstitial markings raise concern for pulmonary edema. No pneumothorax is seen. The cardiomediastinal silhouette is mildly  enlarged. The patient is status post median sternotomy. A valve replacement is noted. A pacemaker is noted overlying the left chest wall, with leads ending overlying the right atrium and right ventricle. No acute osseous abnormalities are identified. IMPRESSION: 1. Small bilateral pleural effusions. Diffusely increased interstitial markings raise concern for pulmonary edema. 2. Mild cardiomegaly. Electronically Signed   By: Garald Balding M.D.   On: 01/31/2018 02:34   Dg Chest Port 1 View  Result Date: 01/27/2018 CLINICAL DATA:  Acute respiratory distress EXAM: PORTABLE CHEST 1 VIEW COMPARISON:  07/19/2017, report 01/01/2018 FINDINGS: Post sternotomy changes. Left-sided pacing device with leads over the right atrium and right ventricle. Epicardial leads also noted. Valve replacement. Cardiomegaly with vascular congestion, small pleural effusions and diffuse interstitial opacities suggesting interstitial edema on chronic underlying fibro interstitial changes. Aortic atherosclerosis. No pneumothorax. IMPRESSION: 1. Cardiomegaly with vascular congestion and diffuse interstitial opacity consistent with edema superimposed on underlying chronic interstitial disease 2. Small pleural effusions. Electronically Signed   By: Donavan Foil M.D.   On: 01/27/2018 23:36    Assessment and Plan:    A/P: 1) Hematuria Following Botox injection and bladder, bleeding since that time Briefly stopped following urologic procedure February 6, on no anticoagulation  Hematuria  after starting Lovenox bridge Currently off anticoagulation, last Lovenox dose yesterday 1 pm, Will defer to urology, There is some urgency in seeking hemostasis given Mechanical mitral valve, paroxysmal atrial fibrillation, history of DVT and PE  urology aware  2) mechanical mitral valve Goal INR 2.5-3.5 Currently on hold in the setting of hematuria  3) paroxysmal atrial fibrillation Maintaining normal sinus rhythm, on  telemetry Anticoagulation held  4) history of DVT and PE Discussed with nursing, we will place scds and TEDS  5) chronic diastolic CHF On high-dose torsemide as an outpatient 80 twice daily Would recommend we restart her torsemide at least 80 daily, resuming twice daily dosing as she resumes her normal dietary intake  6) anemia  Blood loss anemia from hematuria So far relatively stable but will contribute to her shortness of breath and diastolic heart failure symptoms    Greater than 50% was spent in counseling and coordination of care with patient Total encounter time 110 minutes or more   For questions or updates, please contact [place] Please consult [email or web address] for contact info under Cardiology/STEMI.   Signed, Ida Rogue, MD  01/31/2018 1:19 PM

## 2018-01-31 NOTE — Progress Notes (Signed)
Spoke with dr. Lovena Neighbours per dr Gwenyth Ober request. Dr. Lovena Neighbours made aware urine is still bright red with small sediment and clot. I did increase irrigation. Currently urine in tubing is pinkish. Dr. Rockey Situ was questioning if patient needs to have surgery for stop the bleeding, per dr. Lovena Neighbours he seen patient this morning irrigated her and feels that she does not need further interventions at this time. Will continue to monitor and titrate irrigation as needed.

## 2018-01-31 NOTE — Progress Notes (Addendum)
ANTICOAGULATION CONSULT NOTE -follow up  [place] for enoxaparin and warfarin Indication: history of mechanical heart valve - bridge to therapeutic INR  Allergies  Allergen Reactions  . Flecainide Shortness Of Breath and Other (See Comments)    Reaction: dizziness   . Amiodarone Other (See Comments)    Pt states that this medication causes lung bleeding.      Patient Measurements: Height: 5\' 9"  (175.3 cm) Weight: 218 lb 8 oz (99.1 kg) IBW/kg (Calculated) : 66.2 Heparin Dosing Weight:   Vital Signs: Temp: 97.9 F (36.6 C) (02/09 0348) Temp Source: Oral (02/09 0348) BP: 121/60 (02/09 0810) Pulse Rate: 91 (02/09 0810)  Labs: Recent Labs    01/29/18 0456 01/29/18 1416 01/30/18 0600 01/30/18 2122 01/31/18 0558 01/31/18 0559  HGB 9.6*  --  9.1* 9.0*  --  9.0*  HCT 29.9*  --  28.4* 28.0*  --  28.0*  PLT 206  --  205 202  --  218  LABPROT  --  15.6* 15.3*  --  16.1*  --   INR  --  1.25 1.22  --  1.30  --   CREATININE 0.82  --   --   --  0.66  --     Estimated Creatinine Clearance: 80.8 mL/min (by C-G formula based on SCr of 0.66 mg/dL).   Medical History: Past Medical History:  Diagnosis Date  . Acute diastolic heart failure ([place])   . Allergy   . ANCA-associated vasculitis ([place])   . Asthma   . Atrial fibrillation ([place])   . Backache, unspecified   . Cardiomegaly   . COPD (chronic obstructive pulmonary disease) ([place])   . Diabetes mellitus without complication ([place])   . 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 Esophageal reflux   . Headache(784.0)   . Herpes zoster without mention of complication   . Hx: UTI (urinary tract infection)   . Hypertension    heart controlled w CHF  . Nontoxic uninodular goiter   . Obesity, unspecified   . Osteoarthrosis, unspecified whether generalized or localized, unspecified site   . Unspecified sleep apnea   . Urine incontinence    hx of    Medications:  Infusions:     Assessment: 71 yof cc hematuria after botox bladder injection for OAB. Warfarin PTA for AF mechanical valve was reversed with oral vitamin K. Now pharmacy consulted to dose LMWH to bridge to therapeutic INR post cystoscopy.  Home dose: Warfarin  5 mg M,T,W,Th,F  And 6 mg S, Su  2/7  INR 1.25   Warfarin 6 mg 2/8  INR 1.22   Warfarin 6 mg 2/9  INR 1.30  Goal of Therapy:  INR 2.5 to 3.5 due to mechanical mitral valve Monitor platelets by anticoagulation protocol: Yes   Plan:  1. Enoxaparin 1 mg/kg (100 mg) subcutaneously twice daily until INR therapeutic x 2. 2. Check INR this afternoon, then resume warfarin 6 mg po daily. Daily INRs until therapeutic x 2.   01/30/2018 06:00 INR 1.22. Continue 6 mg po daily. Pharmacy will continue to follow and adjust as needed to maintain INR 2.5 to 3.5.   2/9: INR 1.30. Per urology note 2/9: continue to hold coumadin another 24 hrs (per eMAR it appears Warfarin was given last night).  Spoke w/ Dr. Tressia Miners concerning ?Lovenox for bridging order has been discontinued. Per MD, cardiology consulted.  Haila Dena A, Pharm.D., BCPS Clinical Pharmacist 01/31/2018,9:55 AM

## 2018-01-31 NOTE — Progress Notes (Signed)
[facility] NAME: Betty [name]    MR#:  [id number]  DATE OF BIRTH:  [birth year]  SUBJECTIVE:  CHIEF COMPLAINT:   Chief Complaint  Patient presents with  . Hematuria   -Patient with multiple medical problems, had recent bladder injection done and now with hematuria. -Clear yesterday but restarted last night. - Continuous bladder irrigation with pinkish tinged urine now. Lovenox is on hold this morning until cardiology evaluation  REVIEW OF SYSTEMS:  Review of Systems  Constitutional: Positive for malaise/fatigue. Negative for chills and fever.  HENT: Negative for congestion, ear discharge, hearing loss and nosebleeds.   Eyes: Negative for blurred vision and double vision.  Respiratory: Positive for shortness of breath. Negative for cough and wheezing.   Cardiovascular: Negative for chest pain, palpitations and leg swelling.  Gastrointestinal: Negative for abdominal pain, constipation, diarrhea, nausea and vomiting.  Genitourinary: Positive for hematuria. Negative for dysuria.  Musculoskeletal: Positive for back pain. Negative for myalgias.  Neurological: Negative for dizziness, speech change, focal weakness, seizures and headaches.  Psychiatric/Behavioral: Negative for depression.    DRUG ALLERGIES:   Allergies  Allergen Reactions  . Flecainide Shortness Of Breath and Other (See Comments)    Reaction: dizziness   . Amiodarone Other (See Comments)    Pt states that this medication causes lung bleeding.      VITALS:  Blood pressure 121/60, pulse 91, temperature 97.9 F (36.6 C), temperature source Oral, resp. rate 14, height 5\' 9"  (1.753 m), weight 99.1 kg (218 lb 8 oz), SpO2 99 %.  PHYSICAL EXAMINATION:  Physical Exam  GENERAL:  72 y.o.-year-old patient lying in the bed with no acute distress.  EYES: Pupils equal, round, reactive to light and accommodation. No scleral icterus. Extraocular muscles intact.  HEENT: Head  atraumatic, normocephalic. Oropharynx and nasopharynx clear.  NECK:  Supple, no jugular venous distention. No thyroid enlargement, no tenderness.  LUNGS: Normal breath sounds bilaterally, no wheezing, rhonchi or crepitation. No use of accessory muscles of respiration. Chronic bibasilar crackles CARDIOVASCULAR: S1, S2 normal. No rubs, or gallops. 3/6 systolic murmur present. Pacemaker in place ABDOMEN: Soft, nontender, nondistended. Bowel sounds present. No organomegaly or mass.  EXTREMITIES: No pedal edema, cyanosis, or clubbing.  NEUROLOGIC: Cranial nerves II through XII are intact. Muscle strength 5/5 in all extremities. Sensation intact. Gait not checked. Global weakness noted PSYCHIATRIC: The patient is alert and oriented x 3.  SKIN: No obvious rash, lesion, or ulcer.    LABORATORY PANEL:   CBC Recent Labs  Lab 01/31/18 0559  WBC 8.5  HGB 9.0*  HCT 28.0*  PLT 218   ------------------------------------------------------------------------------------------------------------------  Chemistries  Recent Labs  Lab 01/26/18 2157  01/31/18 0558  NA 134*   < > 138  K 4.1   < > 3.3*  CL 94*   < > 96*  CO2 29   < > 31  GLUCOSE 190*   < > 134*  BUN 33*   < > 14  CREATININE 1.17*   < > 0.66  CALCIUM 8.6*   < > 8.6*  AST 39  --   --   ALT 17  --   --   ALKPHOS 96  --   --   BILITOT 0.8  --   --    < > = values in this interval not displayed.   ------------------------------------------------------------------------------------------------------------------  Cardiac Enzymes No results for input(s): TROPONINI in the last 168 hours. ------------------------------------------------------------------------------------------------------------------  RADIOLOGY:  Dg Chest  Port 1 View  Result Date: 01/31/2018 CLINICAL DATA:  Acute onset of cough. EXAM: PORTABLE CHEST 1 VIEW COMPARISON:  Chest radiograph performed 01/27/2018 FINDINGS: Small bilateral pleural effusions are noted.  Diffusely increased interstitial markings raise concern for pulmonary edema. No pneumothorax is seen. The cardiomediastinal silhouette is mildly enlarged. The patient is status post median sternotomy. A valve replacement is noted. A pacemaker is noted overlying the left chest wall, with leads ending overlying the right atrium and right ventricle. No acute osseous abnormalities are identified. IMPRESSION: 1. Small bilateral pleural effusions. Diffusely increased interstitial markings raise concern for pulmonary edema. 2. Mild cardiomegaly. Electronically Signed   By: Garald Balding M.D.   On: 01/31/2018 02:34    EKG:   Orders placed or performed during the hospital encounter of 07/19/17  . EKG 12-Lead  . EKG 12-Lead    ASSESSMENT AND PLAN:   72 year old female with known history of diastolic CHF, A. fib, COPD, diabetes, GERD, sleep apnea, hypertension, interstitial lung disease, arthritis presents to hospital secondary to hematuria after recent Botox injection to the bladder for overactive bladder.  1. Hematuria-secondary to recent bladder instrumentation and also Botox injection on 01/26/2018. Also patient takes Coumadin at home. -Appreciate urology consult. -Currently Coumadin and Lovenox are on hold. According to urology hold for another 24 hours. -Continuous bladder irrigation with pinkish tinged urine. No blood clots noted. Likely. An irrigation if improves. -Hemoglobin is stable at 9, no transfusion needed. -On Keflex for UTI  2. Paroxysmal atrial fibrillation-sick sinus syndrome status post pacemaker. -Continue Cardizem and metoprolol. -Coumadin and Lovenox are currently on hold for 24 more hours - on propafenone  3. Mechanical mitral valve-cardiology has been consulted since creatinine were in and Lovenox have been held. -INR is 1.3  4. Interstitial lung disease-follows with rheumatologist as outpatient. Continue CPAP at bedtime. -on Imuran . continue outpatient follow-up.  5.  Generalized weakness-physical therapy evaluated and recommended SNF  6. Diabetes mellitus-on metformin and sliding scale insulin  7. DVT prophylaxis-currently on hold Agree with Ted's and SCDs.  Physical therapy recommended rehabilitation. Patient will go to [facility] when stable   All the records are reviewed and case discussed with Care Management/Social Workerr. Management plans discussed with the patient, family and they are in agreement.  CODE STATUS: Full code  TOTAL TIME TAKING CARE OF THIS PATIENT: 39 minutes.   POSSIBLE D/C IN 2 DAYS, DEPENDING ON CLINICAL CONDITION.   Betty [name] M.D on 01/31/2018 at 10:59 AM  Between 7am to 6pm - Pager - [phone]  After 6pm go to [email or web address] - password [email or web address] [facility]  Office  [phone]  CC: Primary care physician; Dion Body, MD

## 2018-02-01 DIAGNOSIS — R310 Gross hematuria: Secondary | ICD-10-CM

## 2018-02-01 DIAGNOSIS — N3281 Overactive bladder: Secondary | ICD-10-CM

## 2018-02-01 DIAGNOSIS — Z7189 Other specified counseling: Secondary | ICD-10-CM

## 2018-02-01 DIAGNOSIS — N3289 Other specified disorders of bladder: Secondary | ICD-10-CM

## 2018-02-01 LAB — URINE CULTURE

## 2018-02-01 LAB — GLUCOSE, CAPILLARY
GLUCOSE-CAPILLARY: 133 mg/dL — AB (ref 65–99)
GLUCOSE-CAPILLARY: 92 mg/dL (ref 65–99)
Glucose-Capillary: 118 mg/dL — ABNORMAL HIGH (ref 65–99)
Glucose-Capillary: 94 mg/dL (ref 65–99)

## 2018-02-01 LAB — BASIC METABOLIC PANEL
Anion gap: 11 (ref 5–15)
BUN: 19 mg/dL (ref 6–20)
CALCIUM: 8.5 mg/dL — AB (ref 8.9–10.3)
CO2: 34 mmol/L — AB (ref 22–32)
CREATININE: 0.85 mg/dL (ref 0.44–1.00)
Chloride: 93 mmol/L — ABNORMAL LOW (ref 101–111)
GFR calc Af Amer: >60 mL/min (ref >60)
GFR calc non Af Amer: >60 mL/min (ref >60)
Glucose, Bld: 137 mg/dL — ABNORMAL HIGH (ref 65–99)
Potassium: 3.5 mmol/L (ref 3.5–5.1)
Sodium: 138 mmol/L (ref 135–145)

## 2018-02-01 LAB — CBC
HEMATOCRIT: 30 % — AB (ref 35.0–47.0)
Hemoglobin: 9.7 g/dL — ABNORMAL LOW (ref 12.0–16.0)
MCH: 29 pg (ref 26.0–34.0)
MCHC: 32.2 g/dL (ref 32.0–36.0)
MCV: 90.1 fL (ref 80.0–100.0)
Platelets: 240 10*3/uL (ref 150–440)
RBC: 3.33 MIL/uL — ABNORMAL LOW (ref 3.80–5.20)
RDW: 20.2 % — AB (ref 11.5–14.5)
WBC: 9.4 10*3/uL (ref 3.6–11.0)

## 2018-02-01 LAB — PROTIME-INR
INR: 1.26
Prothrombin Time: 15.7 seconds — ABNORMAL HIGH (ref 11.4–15.2)

## 2018-02-01 MED ORDER — WARFARIN - PHARMACIST DOSING INPATIENT: Freq: Every day | Status: DC

## 2018-02-01 MED ORDER — ENOXAPARIN SODIUM 100 MG/ML ~~LOC~~ SOLN
1.0000 mg/kg | Freq: Once | SUBCUTANEOUS | Status: AC
  Administered 2018-02-01: 95 mg via SUBCUTANEOUS
  Filled 2018-02-01: qty 1

## 2018-02-01 MED ORDER — WARFARIN SODIUM 6 MG PO TABS
6.0000 mg | ORAL_TABLET | Freq: Every day | ORAL | Status: DC
Start: 2018-02-01 — End: 2018-02-04
  Administered 2018-02-01 – 2018-02-03 (×3): 6 mg via ORAL
  Filled 2018-02-01 (×3): qty 1

## 2018-02-01 MED ORDER — SODIUM CHLORIDE 0.9% FLUSH: 3.0000 mL | INTRAVENOUS | Status: DC | PRN

## 2018-02-01 MED ORDER — SODIUM CHLORIDE 0.9% FLUSH
3.0000 mL | Freq: Two times a day (BID) | INTRAVENOUS | Status: DC
  Administered 2018-02-01 – 2018-02-04 (×7): 3 mL via INTRAVENOUS

## 2018-02-01 MED ORDER — ENOXAPARIN SODIUM 100 MG/ML ~~LOC~~ SOLN
1.0000 mg/kg | Freq: Two times a day (BID) | SUBCUTANEOUS | Status: DC
  Administered 2018-02-01 – 2018-02-04 (×6): 95 mg via SUBCUTANEOUS
  Filled 2018-02-01 (×7): qty 1

## 2018-02-01 NOTE — Plan of Care (Signed)
Pain relieved with prn meds

## 2018-02-01 NOTE — Plan of Care (Signed)
Clear amber urine without signs of bleed noted.

## 2018-02-01 NOTE — Progress Notes (Signed)
4 Days Post-Op Subjective: No acute events overnight.  Hematuria resolved.  H/H stable.  Complaining of occasional bladder spasms that are alleviated with oxybutynin/B&O  Objective: Vital signs in last 24 hours: Temp:  [99 F (37.2 C)-99.2 F (37.3 C)] 99.2 F (37.3 C) (02/10 0955) Pulse Rate:  [86-96] 94 (02/10 0955) Resp:  [17-18] 18 (02/10 0955) BP: (108-125)/(47-60) 125/60 (02/10 0955) SpO2:  [94 %-99 %] 95 % (02/10 0955) Weight:  [96.9 kg (213 lb 9.6 oz)] 96.9 kg (213 lb 9.6 oz) (02/10 0443)  Intake/Output from previous day: 02/09 0701 - 02/10 0700 In: 9320 [P.O.:320] Out: 22100 [Urine:22100]  Intake/Output this shift: Total I/O In: -  Out: 850 [Urine:850]  Physical Exam:  General: Alert and oriented CV: RRR, palpable distal pulses Lungs: CTAB, equal chest rise Abdomen: Soft, NTND, no rebound or guarding Ext: NT, No erythema GU: Foley draining clear-yellow urine w/o CBI  Lab Results: Recent Labs    01/30/18 2122 01/31/18 0559 02/01/18 0615  HGB 9.0* 9.0* 9.7*  HCT 28.0* 28.0* 30.0*   BMET Recent Labs    01/31/18 0558 02/01/18 0615  NA 138 138  K 3.3* 3.5  CL 96* 93*  CO2 31 34*  GLUCOSE 134* 137*  BUN 14 19  CREATININE 0.66 0.85  CALCIUM 8.6* 8.5*     Studies/Results: Dg Chest Port 1 View  Result Date: 01/31/2018 CLINICAL DATA:  Acute onset of cough. EXAM: PORTABLE CHEST 1 VIEW COMPARISON:  Chest radiograph performed 01/27/2018 FINDINGS: Small bilateral pleural effusions are noted. Diffusely increased interstitial markings raise concern for pulmonary edema. No pneumothorax is seen. The cardiomediastinal silhouette is mildly enlarged. The patient is status post median sternotomy. A valve replacement is noted. A pacemaker is noted overlying the left chest wall, with leads ending overlying the right atrium and right ventricle. No acute osseous abnormalities are identified. IMPRESSION: 1. Small bilateral pleural effusions. Diffusely increased interstitial  markings raise concern for pulmonary edema. 2. Mild cardiomegaly. Electronically Signed   By: Garald Balding M.D.   On: 01/31/2018 02:34    Assessment/Plan: Gross hematuria following office botox injection on 01/26/18-improving Blood loss anemia- stable History of Diastolic heart failure-hx of mechanical heart valve  -Exchange 54 F Foley for 18 F Foley and continue to monitor for hematuria after her lovenox is restarted.   LOS: 5 days   Ellison Hughs, MD 02/01/2018, 11:51 AM  [facility] Pager: [phone]

## 2018-02-01 NOTE — Progress Notes (Signed)
Called Dr Lovena Neighbours to verify that it is ok to use a #16 FRENCH catheter and he said yes.  I will carry this task out when pt finishes her lunch

## 2018-02-01 NOTE — Progress Notes (Signed)
ANTICOAGULATION CONSULT NOTE -follow up  [place] for enoxaparin and warfarin Indication: history of mechanical heart valve - bridge to therapeutic INR  Allergies  Allergen Reactions  . Flecainide Shortness Of Breath and Other (See Comments)    Reaction: dizziness   . Amiodarone Other (See Comments)    Pt states that this medication causes lung bleeding.      Patient Measurements: Height: 5\' 9"  (175.3 cm) Weight: 213 lb 9.6 oz (96.9 kg) IBW/kg (Calculated) : 66.2 Heparin Dosing Weight:   Vital Signs: Temp: 99.2 F (37.3 C) (02/10 0955) Temp Source: Oral (02/10 0955) BP: 125/60 (02/10 0955) Pulse Rate: 94 (02/10 0955)  Labs: Recent Labs    01/30/18 0600 01/30/18 2122 01/31/18 0558 01/31/18 0559 02/01/18 0615  HGB 9.1* 9.0*  --  9.0* 9.7*  HCT 28.4* 28.0*  --  28.0* 30.0*  PLT 205 202  --  218 240  LABPROT 15.3*  --  16.1*  --  15.7*  INR 1.22  --  1.30  --  1.26  CREATININE  --   --  0.66  --  0.85    Estimated Creatinine Clearance: 75.2 mL/min (by C-G formula based on SCr of 0.85 mg/dL).   Medical History: Past Medical History:  Diagnosis Date  . Acute diastolic heart failure ([place])   . Allergy   . 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 Asthma   . Atrial fibrillation ([place])   . Backache, unspecified   . Cardiomegaly   . COPD (chronic obstructive pulmonary disease) ([place])   . Diabetes mellitus without complication ([place])   . 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 Esophageal reflux   . Headache(784.0)   . Herpes zoster without mention of complication   . Hx: UTI (urinary tract infection)   . Hypertension    heart controlled w CHF  . Nontoxic uninodular goiter   . Obesity, unspecified   . Osteoarthrosis, unspecified whether generalized or localized, unspecified site   . Unspecified sleep apnea   . Urine incontinence    hx of    Medications:  Infusions:    Assessment: 71 yof cc hematuria after botox  bladder injection for OAB. Warfarin PTA for AF mechanical valve was reversed with oral vitamin K. Now pharmacy consulted to dose LMWH to bridge to therapeutic INR post cystoscopy.  Home dose: Warfarin  5 mg M,T,W,Th,F  And 6 mg S, Su  2/7   INR 1.25   Warfarin 6 mg 2/8   INR 1.22   Warfarin 6 mg 2/9   INR 1.30  On HOLD 2/10 INR 1.26  HOLDING  Goal of Therapy:  INR 2.5 to 3.5 due to mechanical mitral valve Monitor platelets by anticoagulation protocol: Yes   Plan:  1. Enoxaparin 1 mg/kg (100 mg) subcutaneously twice daily until INR therapeutic x 2. 2. Check INR this afternoon, then resume warfarin 6 mg po daily. Daily INRs until therapeutic x 2.   01/30/2018 06:00 INR 1.22. Continue 6 mg po daily. Pharmacy will continue to follow and adjust as needed to maintain INR 2.5 to 3.5.   2/9: INR 1.30. Per urology note 2/9: continue to hold coumadin another 24 hrs (per eMAR it appears Warfarin was given last night).  Spoke w/ Dr. Tressia Miners concerning ?Lovenox for bridging order has been discontinued. Per MD, cardiology consulted.  2/10: Holding Warfarin and Lovenox: see Cardiology note.   Atharva Mirsky A, Pharm.D., BCPS Clinical Pharmacist 02/01/2018,10:00 AM

## 2018-02-01 NOTE — Progress Notes (Signed)
Inserted foley catheter #16,  Pt tolerated the task without pain or discomfort

## 2018-02-01 NOTE — Progress Notes (Signed)
Progress Note  Patient Name: Betty [name] Date of Encounter: 02/01/2018  Primary Cardiologist: new to [facility]  Subjective   Fatigue this morning, mild shortness of breath on any exertion Discussed her torsemide dosing typically takes torsemide 80 twice daily Received 1 dose of 80 yesterday Urine is now clear Irrigation catheter still in place Urology in the room, discussed case, plan is to change out size of catheter, leave catheter in place to avoid bladder distention   Inpatient Medications    Scheduled Meds: . atorvastatin  20 mg Oral Daily  . azaTHIOprine  100 mg Oral Daily  . calcium-vitamin D  1 tablet Oral Daily  . cephALEXin  500 mg Oral Q8H  . diltiazem  180 mg Oral Daily  . enoxaparin (LOVENOX) injection  1 mg/kg Subcutaneous Q12H  . enoxaparin (LOVENOX) injection  1 mg/kg Subcutaneous Once  . ferrous sulfate  325 mg Oral Daily  . fesoterodine  8 mg Oral Daily  . fluticasone  2 spray Each Nare Daily  . gabapentin  600 mg Oral BID  . insulin aspart  0-9 Units Subcutaneous TID WC  . loratadine  10 mg Oral Daily  . magnesium oxide  400 mg Oral Daily  . metFORMIN  1,000 mg Oral BID WC  . metolazone  2.5 mg Oral Once per day on Mon Thu  .  morphine injection  2 mg Intravenous Once  . pantoprazole  40 mg Oral Daily  . potassium chloride SA  20 mEq Oral Daily  . propafenone  225 mg Oral Q12H  . rOPINIRole  4 mg Oral QHS  . senna-docusate  2 tablet Oral BID  . sodium chloride flush  3 mL Intravenous Q12H  . torsemide  80 mg Oral Daily  . venlafaxine  75 mg Oral TID WC  . [START ON 02/02/2018] Vitamin D (Ergocalciferol)  50,000 Units Oral Q7 days  . warfarin  6 mg Oral q1800  . Warfarin - Pharmacist Dosing Inpatient   Does not apply q1800  . Warfarin - Pharmacist Dosing Inpatient   Does not apply q1800   Continuous Infusions:  PRN Meds: acetaminophen **OR** acetaminophen, diphenhydrAMINE, guaiFENesin, guaiFENesin-dextromethorphan, morphine injection, ondansetron  **OR** ondansetron (ZOFRAN) IV, opium-belladonna, oxybutynin, sodium chloride flush, traZODone   Vital Signs    Vitals:   01/31/18 1624 01/31/18 1956 02/01/18 0443 02/01/18 0955  BP:  (!) 115/55 (!) 123/47 125/60  Pulse:  86 96 94  Resp:  17 18 18   Temp:  99 F (37.2 C) 99 F (37.2 C) 99.2 F (37.3 C)  TempSrc:  Oral Oral Oral  SpO2: 97% 98% 94% 95%  Weight:   213 lb 9.6 oz (96.9 kg)   Height:        Intake/Output Summary (Last 24 hours) at 02/01/2018 1351 Last data filed at 02/01/2018 0856 Gross per 24 hour  Intake 11420 ml  Output 18550 ml  Net -7130 ml   Filed Weights   01/30/18 0448 01/31/18 0348 02/01/18 0443  Weight: 220 lb 6.4 oz (100 kg) 218 lb 8 oz (99.1 kg) 213 lb 9.6 oz (96.9 kg)    Telemetry    Normal sinus rhythm- Personally Reviewed  ECG     Physical Exam   Constitutional:  oriented to person, place, and time. No distress.  HENT:  Head: Normocephalic and atraumatic.  Eyes:  no discharge. No scleral icterus.  Neck: Normal range of motion. Neck supple. No JVD present.  Cardiovascular: Normal rate, regular rhythm, normal heart sounds  and intact distal pulses. Exam reveals no gallop and no friction rub. No edema No murmur heard. Pulmonary/Chest: None Clear to auscultation bilaterally.  Scattered Rales at the bases Abdominal: Soft.  no distension.  no tenderness.  Musculoskeletal: Normal range of motion.  no  tenderness or deformity.  Neurological:  normal muscle tone. Coordination normal. No atrophy Skin: Skin is warm and dry. No rash noted. not diaphoretic.  Psychiatric:  normal mood and affect. behavior is normal. Thought content normal.      Labs    Chemistry Recent Labs  Lab 01/26/18 2157  01/29/18 0456 01/31/18 0558 02/01/18 0615  NA 134*   < > 134* 138 138  K 4.1   < > 3.6 3.3* 3.5  CL 94*   < > 96* 96* 93*  CO2 29   < > 26 31 34*  GLUCOSE 190*   < > 168* 134* 137*  BUN 33*   < > 23* 14 19  CREATININE 1.17*   < > 0.82 0.66 0.85    CALCIUM 8.6*   < > 8.4* 8.6* 8.5*  PROT 7.0  --   --   --   --   ALBUMIN 3.9  --   --   --   --   AST 39  --   --   --   --   ALT 17  --   --   --   --   ALKPHOS 96  --   --   --   --   BILITOT 0.8  --   --   --   --   GFRNONAA 46*   < > >60 >60 >60  GFRAA 53*   < > >60 >60 >60  ANIONGAP 11   < > 12 11 11    < > = values in this interval not displayed.     Hematology Recent Labs  Lab 01/30/18 2122 01/31/18 0559 02/01/18 0615  WBC 9.0 8.5 9.4  RBC 3.13* 3.15* 3.33*  HGB 9.0* 9.0* 9.7*  HCT 28.0* 28.0* 30.0*  MCV 89.5 89.0 90.1  MCH 28.8 28.7 29.0  MCHC 32.2 32.3 32.2  RDW 20.0* 19.7* 20.2*  PLT 202 218 240    Cardiac EnzymesNo results for input(s): TROPONINI in the last 168 hours. No results for input(s): TROPIPOC in the last 168 hours.   BNPNo results for input(s): BNP, PROBNP in the last 168 hours.   DDimer No results for input(s): DDIMER in the last 168 hours.   Radiology    Dg Chest Port 1 View  Result Date: 01/31/2018 CLINICAL DATA:  Acute onset of cough. EXAM: PORTABLE CHEST 1 VIEW COMPARISON:  Chest radiograph performed 01/27/2018 FINDINGS: Small bilateral pleural effusions are noted. Diffusely increased interstitial markings raise concern for pulmonary edema. No pneumothorax is seen. The cardiomediastinal silhouette is mildly enlarged. The patient is status post median sternotomy. A valve replacement is noted. A pacemaker is noted overlying the left chest wall, with leads ending overlying the right atrium and right ventricle. No acute osseous abnormalities are identified. IMPRESSION: 1. Small bilateral pleural effusions. Diffusely increased interstitial markings raise concern for pulmonary edema. 2. Mild cardiomegaly. Electronically Signed   By: Garald Balding M.D.   On: 01/31/2018 02:34    Cardiac Studies     Patient Profile     Betty [name] is a 72 y.o. 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 Vikki Ports for overactive bladder who presented with hematuria, bladder spasms, passing clots   Assessment & Plan      A/P: 1) Hematuria Following Botox injection and bladder, bleeding since that time Briefly stopped following urologic procedure February 6, on no anticoagulation  Hematuria  after starting Lovenox bridge  off anticoagulation, last Lovenox dose 2 days ago  1 pm, Plan is to place smaller catheter, keep this in place, restart anticoagulation  2) mechanical mitral valve Goal INR 2.5-3.5 The plan is to restart anticoagulation given urine is clear Lovenox per pharmacy  3) paroxysmal atrial fibrillation Maintaining normal sinus rhythm, on telemetry Anticoagulation to be restarted cautiously  4) history of DVT and PE Would continue scds and TEDS Lovenox to be restarted  5) chronic diastolic CHF On high-dose torsemide as an outpatient 80 twice daily She reports continued shortness of breath, we will increase torsemide up to 80 twice daily  6) anemia  Blood loss anemia from hematuria Stable  Case discussed with nursing, and with urology   Total encounter time more than 35 minutes  Greater than 50% was spent in counseling and coordination of care with the patient  For questions or updates, please contact [facility] Please consult [email or web address] for contact info under Cardiology/STEMI.      Signed, Ida Rogue, MD  02/01/2018, 1:51 PM

## 2018-02-01 NOTE — Progress Notes (Signed)
[facility] NAME: Betty [name]    MR#:  [id number]  DATE OF BIRTH:  1946-07-28  SUBJECTIVE:  CHIEF COMPLAINT:   Chief Complaint  Patient presents with  . Hematuria   -Coumadin has been held yesterday, urine in the Foley catheter is amber colored today. -Patient still having occasional bladder spasms. Also complains of cough and congestion  REVIEW OF SYSTEMS:  Review of Systems  Constitutional: Positive for malaise/fatigue. Negative for chills and fever.  HENT: Negative for congestion, ear discharge, hearing loss and nosebleeds.   Eyes: Negative for blurred vision and double vision.  Respiratory: Positive for cough and shortness of breath. Negative for wheezing.   Cardiovascular: Negative for chest pain, palpitations and leg swelling.  Gastrointestinal: Negative for abdominal pain, constipation, diarrhea, nausea and vomiting.  Genitourinary: Positive for hematuria. Negative for dysuria.  Musculoskeletal: Positive for back pain. Negative for myalgias.  Neurological: Negative for dizziness, speech change, focal weakness, seizures and headaches.  Psychiatric/Behavioral: Negative for depression.    DRUG ALLERGIES:   Allergies  Allergen Reactions  . Flecainide Shortness Of Breath and Other (See Comments)    Reaction: dizziness   . Amiodarone Other (See Comments)    Pt states that this medication causes lung bleeding.      VITALS:  Blood pressure (!) 123/47, pulse 96, temperature 99 F (37.2 C), temperature source Oral, resp. rate 18, height 5\' 9"  (1.753 m), weight 96.9 kg (213 lb 9.6 oz), SpO2 94 %.  PHYSICAL EXAMINATION:  Physical Exam  GENERAL:  72 y.o.-year-old patient lying in the bed with no acute distress.  EYES: Pupils equal, round, reactive to light and accommodation. No scleral icterus. Extraocular muscles intact.  HEENT: Head atraumatic, normocephalic. Oropharynx and nasopharynx clear.  NECK:  Supple, no  jugular venous distention. No thyroid enlargement, no tenderness.  LUNGS: Normal breath sounds bilaterally, no wheezing, rhonchi or crepitation. No use of accessory muscles of respiration. Chronic bibasilar crackles CARDIOVASCULAR: S1, S2 normal. No rubs, or gallops. 3/6 systolic murmur present. Pacemaker in place ABDOMEN: Soft, nontender, nondistended. Bowel sounds present. No organomegaly or mass.  EXTREMITIES: No pedal edema, cyanosis, or clubbing.  NEUROLOGIC: Cranial nerves II through XII are intact. Muscle strength 5/5 in all extremities. Sensation intact. Gait not checked. Global weakness noted PSYCHIATRIC: The patient is alert and oriented x 3.  SKIN: No obvious rash, lesion, or ulcer.    LABORATORY PANEL:   CBC Recent Labs  Lab 02/01/18 0615  WBC 9.4  HGB 9.7*  HCT 30.0*  PLT 240   ------------------------------------------------------------------------------------------------------------------  Chemistries  Recent Labs  Lab 01/26/18 2157  02/01/18 0615  NA 134*   < > 138  K 4.1   < > 3.5  CL 94*   < > 93*  CO2 29   < > 34*  GLUCOSE 190*   < > 137*  BUN 33*   < > 19  CREATININE 1.17*   < > 0.85  CALCIUM 8.6*   < > 8.5*  AST 39  --   --   ALT 17  --   --   ALKPHOS 96  --   --   BILITOT 0.8  --   --    < > = values in this interval not displayed.   ------------------------------------------------------------------------------------------------------------------  Cardiac Enzymes No results for input(s): TROPONINI in the last 168 hours. ------------------------------------------------------------------------------------------------------------------  RADIOLOGY:  Dg Chest Port 1 View  Result Date: 01/31/2018 CLINICAL DATA:  Acute  onset of cough. EXAM: PORTABLE CHEST 1 VIEW COMPARISON:  Chest radiograph performed 01/27/2018 FINDINGS: Small bilateral pleural effusions are noted. Diffusely increased interstitial markings raise concern for pulmonary edema. No  pneumothorax is seen. The cardiomediastinal silhouette is mildly enlarged. The patient is status post median sternotomy. A valve replacement is noted. A pacemaker is noted overlying the left chest wall, with leads ending overlying the right atrium and right ventricle. No acute osseous abnormalities are identified. IMPRESSION: 1. Small bilateral pleural effusions. Diffusely increased interstitial markings raise concern for pulmonary edema. 2. Mild cardiomegaly. Electronically Signed   By: Garald Balding M.D.   On: 01/31/2018 02:34    EKG:   Orders placed or performed during the hospital encounter of 07/19/17  . EKG 12-Lead  . EKG 12-Lead    ASSESSMENT AND PLAN:   72 year old female with known history of diastolic CHF, A. fib, COPD, diabetes, GERD, sleep apnea, hypertension, interstitial lung disease, arthritis presents to hospital secondary to hematuria after recent Botox injection to the bladder for overactive bladder.  1. Hematuria-secondary to recent bladder instrumentation and also Botox injection on 01/26/2018. Also patient takes Coumadin at home. -Appreciate urology consult. -Currently Coumadin and Lovenox are on hold.  -Restart per urology-will need to be restarted at the earliest possible due to significant history of A. fib, serum DVT and PE, mechanical mitral valve -Continuous bladder irrigation on hold as urine has cleared up. No blood clots noted. . -Hemoglobin is stable at 9, no transfusion needed. -On Keflex for UTI -We'll also discuss with urology when the Foley catheter needs to come out  2. Paroxysmal atrial fibrillation-sick sinus syndrome status post pacemaker. -Continue Cardizem and metoprolol. -Coumadin and Lovenox are currently on hold for 24 hours - on propafenone -Appreciate cardiology consult  3. Mechanical mitral valve- Coumadin on hold. -Appreciate cardiology consult.-INR is 1.3  4. Interstitial lung disease-follows with rheumatologist as outpatient. Continue  CPAP at bedtime. -on Imuran . continue outpatient follow-up. -When necessary oxygen at home. Currently on 2 L  5. Generalized weakness-physical therapy evaluated and recommended SNF  6. Diabetes mellitus-on metformin and sliding scale insulin  7. DVT prophylaxis-currently on hold Agree with Ted's and SCDs.  Physical therapy recommended rehabilitation. Patient will go to [facility] when stable   All the records are reviewed and case discussed with Care Management/Social Workerr. Management plans discussed with the patient, family and they are in agreement.  CODE STATUS: Full code  TOTAL TIME TAKING CARE OF THIS PATIENT: 38 minutes.   POSSIBLE D/C IN 1-2 DAYS, DEPENDING ON CLINICAL CONDITION.   Bryah Ocheltree M.D on 02/01/2018 at 8:18 AM  Between 7am to 6pm - Pager - [phone]  After 6pm go to [email or web address] - password [email or web address] [facility]  Office  [phone]  CC: Primary care physician; Dion Body, MD

## 2018-02-01 NOTE — Progress Notes (Signed)
ANTICOAGULATION CONSULT NOTE -follow up  [place] for enoxaparin and warfarin Indication: history of mechanical heart valve - bridge to therapeutic INR  Allergies  Allergen Reactions  . Flecainide Shortness Of Breath and Other (See Comments)    Reaction: dizziness   . Amiodarone Other (See Comments)    Pt states that this medication causes lung bleeding.      Patient Measurements: Height: 5\' 9"  (175.3 cm) Weight: 213 lb 9.6 oz (96.9 kg) IBW/kg (Calculated) : 66.2 Heparin Dosing Weight:   Vital Signs: Temp: 99.2 F (37.3 C) (02/10 0955) Temp Source: Oral (02/10 0955) BP: 125/60 (02/10 0955) Pulse Rate: 94 (02/10 0955)  Labs: Recent Labs    01/30/18 0600 01/30/18 2122 01/31/18 0558 01/31/18 0559 02/01/18 0615  HGB 9.1* 9.0*  --  9.0* 9.7*  HCT 28.4* 28.0*  --  28.0* 30.0*  PLT 205 202  --  218 240  LABPROT 15.3*  --  16.1*  --  15.7*  INR 1.22  --  1.30  --  1.26  CREATININE  --   --  0.66  --  0.85    Estimated Creatinine Clearance: 75.2 mL/min (by C-G formula based on SCr of 0.85 mg/dL).   Medical History: Past Medical History:  Diagnosis Date  . Acute diastolic heart failure ([facility])   . Allergy   . 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 Asthma   . Atrial fibrillation ([facility])   . Backache, unspecified   . Cardiomegaly   . COPD (chronic obstructive pulmonary disease) ([facility])   . Diabetes mellitus without complication ([place])   . 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 Esophageal reflux   . Headache(784.0)   . Herpes zoster without mention of complication   . Hx: UTI (urinary tract infection)   . Hypertension    heart controlled w CHF  . Nontoxic uninodular goiter   . Obesity, unspecified   . Osteoarthrosis, unspecified whether generalized or localized, unspecified site   . Unspecified sleep apnea   . Urine incontinence    hx of    Medications:  Infusions:    Assessment: 71 yof cc hematuria after botox  bladder injection for OAB. Warfarin PTA for AF mechanical valve was reversed with oral vitamin K. Now pharmacy consulted to dose LMWH to bridge to therapeutic INR post cystoscopy.  Home dose: Warfarin  5 mg M,T,W,Th,F  And 6 mg S, Su  2/7   INR 1.25   Warfarin 6 mg 2/8   INR 1.22   Warfarin 6 mg 2/9   INR 1.30  On HOLD 2/10 INR 1.26  HOLDING  Goal of Therapy:  INR 2.5 to 3.5 due to mechanical mitral valve Monitor platelets by anticoagulation protocol: Yes   Plan:  1. Enoxaparin 1 mg/kg (100 mg) subcutaneously twice daily until INR therapeutic x 2. 2. Check INR this afternoon, then resume warfarin 6 mg po daily. Daily INRs until therapeutic x 2.   01/30/2018 06:00 INR 1.22. Continue 6 mg po daily. Pharmacy will continue to follow and adjust as needed to maintain INR 2.5 to 3.5.   2/9: INR 1.30. Per urology note 2/9: continue to hold coumadin another 24 hrs (per eMAR it appears Warfarin was given last night).  Spoke w/ Dr. Tressia Miners concerning ?Lovenox for bridging order has been discontinued. Per MD, cardiology consulted.  2/10: Holding Warfarin and Lovenox: see Cardiology note.   2/10 PM: Orders to resume warfarin with Lovenox bridging.  Lovenox  95 mg (1 mg/kg) q 12 hours and warfarin 6 mg daily. Will f/u AM INR.   Ulice Dash D, Pharm.D., BCPS Clinical Pharmacist 02/01/2018,1:14 PM

## 2018-02-01 NOTE — Progress Notes (Signed)
Pt alert and oriented x4, no complaints of pain or discomfort.  Bed in low position, call bell within reach.  Bed alarms on and functioning.  Assessment done and charted.  Will continue to monitor and do hourly rounding throughout the shift 

## 2018-02-02 ENCOUNTER — Telehealth: Payer: Self-pay | Admitting: Radiology

## 2018-02-02 DIAGNOSIS — I5033 Acute on chronic diastolic (congestive) heart failure: Secondary | ICD-10-CM

## 2018-02-02 DIAGNOSIS — R310 Gross hematuria: Secondary | ICD-10-CM

## 2018-02-02 DIAGNOSIS — N3289 Other specified disorders of bladder: Secondary | ICD-10-CM

## 2018-02-02 LAB — GLUCOSE, CAPILLARY
GLUCOSE-CAPILLARY: 202 mg/dL — AB (ref 65–99)
Glucose-Capillary: 167 mg/dL — ABNORMAL HIGH (ref 65–99)
Glucose-Capillary: 149 mg/dL — ABNORMAL HIGH (ref 65–99)
Glucose-Capillary: 139 mg/dL — ABNORMAL HIGH (ref 65–99)

## 2018-02-02 LAB — PROTIME-INR
INR: 1.15
Prothrombin Time: 14.6 seconds (ref 11.4–15.2)

## 2018-02-02 MED ORDER — FUROSEMIDE 10 MG/ML IJ SOLN
40.0000 mg | Freq: Once | INTRAMUSCULAR | Status: AC
  Administered 2018-02-02: 40 mg via INTRAVENOUS
  Filled 2018-02-02: qty 4

## 2018-02-02 NOTE — Telephone Encounter (Signed)
-----   Message from Hollice Espy, MD sent at 02/02/2018  3:48 PM EST ----- Regarding: VT on nurse schedule next week Please add this patient to the nurse schedule next week for voiding trial.  She should also keep her appointment with Dr. Matilde Sprang in 2 weeks as already scheduled.  She will not be coming to her nurse visit tomorrow as she is still inpatient.  Hollice Espy, MD

## 2018-02-02 NOTE — Progress Notes (Signed)
Urology Consult Follow Up  Subjective: Episode of recurrent gross hematuria over the weekend.  Anticoagulation briefly held again but has since been resumed.  Bleeding is stopped.  She has a 16 [place] Foley catheter in place with clear yellow urine.  Anti-infectives: Anti-infectives (From admission, onward)   Start     Dose/Rate Route Frequency Ordered Stop   01/30/18 2200  cephALEXin (KEFLEX) capsule 500 mg     500 mg Oral Every 8 hours 01/30/18 1404 02/01/18 2139   01/28/18 0000  ceFEPIme (MAXIPIME) 2 g in dextrose 5 % 50 mL IVPB  Status:  Discontinued     2 g 100 mL/hr over 30 Minutes Intravenous Every 8 hours 01/27/18 2343 01/30/18 1404      Current Facility-Administered Medications  Medication Dose Route Frequency Provider Last Rate Last Dose  . acetaminophen (TYLENOL) tablet 650 mg  650 mg Oral Q6H PRN Saundra Shelling, MD   650 mg at 02/01/18 1856   Or  . acetaminophen (TYLENOL) suppository 650 mg  650 mg Rectal Q6H PRN Pyreddy, Reatha Harps, MD      . atorvastatin (LIPITOR) tablet 20 mg  20 mg Oral Daily Pyreddy, Reatha Harps, MD   20 mg at 02/02/18 9381  . azaTHIOprine (IMURAN) tablet 100 mg  100 mg Oral Daily Pyreddy, Reatha Harps, MD   100 mg at 02/02/18 0836  . calcium-vitamin D (OSCAL WITH D) 500-200 MG-UNIT per tablet 1 tablet  1 tablet Oral Daily Saundra Shelling, MD   1 tablet at 02/02/18 0835  . diltiazem (CARDIZEM CD) 24 hr capsule 180 mg  180 mg Oral Daily Pyreddy, Reatha Harps, MD   180 mg at 02/02/18 0832  . diphenhydrAMINE (BENADRYL) capsule 25 mg  25 mg Oral TID PRN Lance Coon, MD   25 mg at 02/01/18 1122  . enoxaparin (LOVENOX) injection 95 mg  1 mg/kg Subcutaneous Q12H Gladstone Lighter, MD   95 mg at 02/02/18 0845  . ferrous sulfate tablet 325 mg  325 mg Oral Daily Saundra Shelling, MD   325 mg at 02/02/18 0836  . fesoterodine (TOVIAZ) tablet 8 mg  8 mg Oral Daily Pyreddy, Reatha Harps, MD   8 mg at 02/02/18 0839  . fluticasone (FLONASE) 50 MCG/ACT nasal spray 2 spray  2 spray Each Nare Daily  Saundra Shelling, MD   2 spray at 02/02/18 0836  . gabapentin (NEURONTIN) capsule 600 mg  600 mg Oral BID Saundra Shelling, MD   600 mg at 02/02/18 0835  . guaiFENesin (ROBITUSSIN) 100 MG/5ML solution 100 mg  5 mL Oral Q4H PRN Vaughan Basta, MD   100 mg at 02/02/18 0848  . guaiFENesin-dextromethorphan (ROBITUSSIN DM) 100-10 MG/5ML syrup 5 mL  5 mL Oral Q4H PRN Vaughan Basta, MD   5 mL at 02/01/18 1856  . insulin aspart (novoLOG) injection 0-9 Units  0-9 Units Subcutaneous TID WC Vaughan Basta, MD   1 Units at 02/02/18 1328  . loratadine (CLARITIN) tablet 10 mg  10 mg Oral Daily Pyreddy, Reatha Harps, MD   10 mg at 02/02/18 8299  . magnesium oxide (MAG-OX) tablet 400 mg  400 mg Oral Daily Pyreddy, Reatha Harps, MD   400 mg at 02/02/18 0842  . metFORMIN (GLUCOPHAGE) tablet 1,000 mg  1,000 mg Oral BID WC Pyreddy, Reatha Harps, MD   1,000 mg at 02/02/18 3716  . metolazone (ZAROXOLYN) tablet 2.5 mg  2.5 mg Oral Once per day on Mon Thu Pyreddy, Pavan, MD   2.5 mg at 02/02/18 0839  . morphine 2 MG/ML injection 2 mg  2 mg Intravenous Q4H PRN Saundra Shelling, MD   2 mg at 02/01/18 0009  . morphine 2 MG/ML injection 2 mg  2 mg Intravenous Once Vaughan Basta, MD      . ondansetron (ZOFRAN) tablet 4 mg  4 mg Oral Q6H PRN Pyreddy, Reatha Harps, MD       Or  . ondansetron (ZOFRAN) injection 4 mg  4 mg Intravenous Q6H PRN Pyreddy, Reatha Harps, MD      . opium-belladonna (B&O SUPPRETTES) 16.2-60 MG suppository 1 suppository  1 suppository Rectal Q6H PRN Hollice Espy, MD   1 suppository at 01/27/18 2138  . oxybutynin (DITROPAN) tablet 5 mg  5 mg Oral Q8H PRN Ceasar Mons, MD   5 mg at 01/31/18 1524  . pantoprazole (PROTONIX) EC tablet 40 mg  40 mg Oral Daily Pyreddy, Reatha Harps, MD   40 mg at 02/02/18 0832  . potassium chloride SA (K-DUR,KLOR-CON) CR tablet 20 mEq  20 mEq Oral Daily Pyreddy, Reatha Harps, MD   20 mEq at 02/02/18 0837  . propafenone (RYTHMOL) tablet 225 mg  225 mg Oral Q12H Saundra Shelling, MD   225  mg at 02/02/18 0839  . rOPINIRole (REQUIP) tablet 4 mg  4 mg Oral QHS Saundra Shelling, MD   4 mg at 02/01/18 2138  . senna-docusate (Senokot-S) tablet 2 tablet  2 tablet Oral BID Saundra Shelling, MD   2 tablet at 02/02/18 0839  . sodium chloride flush (NS) 0.9 % injection 3 mL  3 mL Intravenous PRN Gladstone Lighter, MD      . sodium chloride flush (NS) 0.9 % injection 3 mL  3 mL Intravenous Q12H Gladstone Lighter, MD   3 mL at 02/02/18 1327  . torsemide (DEMADEX) tablet 80 mg  80 mg Oral Daily Minna Merritts, MD   80 mg at 02/02/18 0835  . traZODone (DESYREL) tablet 150 mg  150 mg Oral QHS PRN Awilda Bill, NP   150 mg at 01/28/18 2324  . venlafaxine (EFFEXOR) tablet 75 mg  75 mg Oral TID WC Vaughan Basta, MD   75 mg at 02/02/18 1327  . Vitamin D (Ergocalciferol) (DRISDOL) capsule 50,000 Units  50,000 Units Oral Q7 days Saundra Shelling, MD   50,000 Units at 02/02/18 0840  . warfarin (COUMADIN) tablet 6 mg  6 mg Oral q1800 Gladstone Lighter, MD   6 mg at 02/01/18 1714  . Warfarin - Pharmacist Dosing Inpatient   Does not apply [id number] Gladstone Lighter, MD      . Warfarin - Pharmacist Dosing Inpatient   Does not apply [id number] Gladstone Lighter, MD         Objective: Vital signs in last 24 hours: Temp:  [98 F (36.7 C)-98.6 F (37 C)] 98.6 F (37 C) (02/11 0823) Pulse Rate:  [85-96] 89 (02/11 0823) Resp:  [14-18] 18 (02/11 0823) BP: (105-144)/(55-84) 114/61 (02/11 0823) SpO2:  [88 %-98 %] 97 % (02/11 0823) Weight:  [211 lb 1.6 oz (95.8 kg)] 211 lb 1.6 oz (95.8 kg) (02/11 0439)  Intake/Output from previous day: 02/10 0701 - 02/11 0700 In: 2100  Out: 3800 [Urine:3800] Intake/Output this shift: Total I/O In: -  Out: 1200 [Urine:1200]   Physical Exam No acute distress, alert and oriented but occasionally confused Abdomen soft, nondistended Foley catheter in place draining clear yellow urine, no clots.   Lab Results:  Recent Labs    01/31/18 0559 02/01/18 0615   WBC 8.5 9.4  HGB 9.0* 9.7*  HCT 28.0* 30.0*  PLT  218 240   BMET Recent Labs    01/31/18 0558 02/01/18 0615  NA 138 138  K 3.3* 3.5  CL 96* 93*  CO2 31 34*  GLUCOSE 134* 137*  BUN 14 19  CREATININE 0.66 0.85  CALCIUM 8.6* 8.5*   PT/INR Recent Labs    02/01/18 0615 02/02/18 0527  LABPROT 15.7* 14.6  INR 1.26 1.15    Assessment: 72 year old female with gross hematuria/clot retention following intravesical Botox injection 1 week ago status post clot evacuation/fulguration 5 days ago.  She had a recurrent episode of bleeding over the weekend which resolved spontaneously.  Anticoagulation including Lovenox and Coumadin have been resumed.  29 [place] Foley catheter now in place.  Plan: -Leave Foley catheter in place with outpatient voiding trial next week in order to avoid over distention of the bladder which may exacerbate bleeding episode -Please contact urology if hematuria recurs -Okay to resume all anticoagulation this high risk patient  Case is discussed with Dr. Tressia Miners today  Urology will sign off for the time being, please contact us again if she has any recurrent bleeding   LOS: 6 days    Hollice Espy 02/02/2018

## 2018-02-02 NOTE — Progress Notes (Signed)
Pt alert and oriented x4, no complaints of pain or discomfort.  Bed in low position, call bell within reach.  Bed alarms on and functioning.  Assessment done and charted.  Will continue to monitor and do hourly rounding throughout the shift 

## 2018-02-02 NOTE — Progress Notes (Signed)
[facility] [place] NAME: Betty [name]    MR#:  [id number]  DATE OF BIRTH:  [birth year]  SUBJECTIVE:  CHIEF COMPLAINT:   Chief Complaint  Patient presents with  . Hematuria   -Worsening shortness of breath this morning. Requiring 2 L oxygen and still. -Hematuria has cleared up.  -On Coumadin now, being bridged with Lovenox  REVIEW OF SYSTEMS:  Review of Systems  Constitutional: Positive for malaise/fatigue. Negative for chills and fever.  HENT: Negative for congestion, ear discharge, hearing loss and nosebleeds.        Dry mouth  Eyes: Negative for blurred vision and double vision.  Respiratory: Positive for cough and shortness of breath. Negative for wheezing.   Cardiovascular: Negative for chest pain, palpitations and leg swelling.  Gastrointestinal: Negative for abdominal pain, constipation, diarrhea, nausea and vomiting.  Genitourinary: Positive for hematuria. Negative for dysuria.  Musculoskeletal: Positive for back pain. Negative for myalgias.  Neurological: Negative for dizziness, speech change, focal weakness, seizures and headaches.  Psychiatric/Behavioral: Negative for depression.    DRUG ALLERGIES:   Allergies  Allergen Reactions  . Flecainide Shortness Of Breath and Other (See Comments)    Reaction: dizziness   . Amiodarone Other (See Comments)    Pt states that this medication causes lung bleeding.      VITALS:  Blood pressure 114/61, pulse 89, temperature 98.6 F (37 C), temperature source Oral, resp. rate 18, height 5\' 9"  (1.753 m), weight 95.8 kg (211 lb 1.6 oz), SpO2 97 %.  PHYSICAL EXAMINATION:  Physical Exam  GENERAL:  72 y.o.-year-old patient lying in the bed with no acute distress.  EYES: Pupils equal, round, reactive to light and accommodation. No scleral icterus. Extraocular muscles intact.  HEENT: Head atraumatic, normocephalic. Oropharynx and nasopharynx clear.  NECK:  Supple, no jugular venous  distention. No thyroid enlargement, no tenderness.  LUNGS: Normal breath sounds bilaterally, no wheezing, rhonchi or crepitation. Bibasilar crackles heard which is chronic. Using her accessory muscles on exertion CARDIOVASCULAR: S1, S2 normal. No rubs, or gallops. 3/6 systolic murmur present. Pacemaker in place ABDOMEN: Soft, nontender, nondistended. Bowel sounds present. No organomegaly or mass.  EXTREMITIES: No pedal edema, cyanosis, or clubbing.  NEUROLOGIC: Cranial nerves II through XII are intact. Muscle strength 5/5 in all extremities. Sensation intact. Gait not checked. Global weakness noted PSYCHIATRIC: The patient is alert and oriented x 3.  SKIN: No obvious rash, lesion, or ulcer.    LABORATORY PANEL:   CBC Recent Labs  Lab 02/01/18 0615  WBC 9.4  HGB 9.7*  HCT 30.0*  PLT 240   ------------------------------------------------------------------------------------------------------------------  Chemistries  Recent Labs  Lab 01/26/18 2157  02/01/18 0615  NA 134*   < > 138  K 4.1   < > 3.5  CL 94*   < > 93*  CO2 29   < > 34*  GLUCOSE 190*   < > 137*  BUN 33*   < > 19  CREATININE 1.17*   < > 0.85  CALCIUM 8.6*   < > 8.5*  AST 39  --   --   ALT 17  --   --   ALKPHOS 96  --   --   BILITOT 0.8  --   --    < > = values in this interval not displayed.   ------------------------------------------------------------------------------------------------------------------  Cardiac Enzymes No results for input(s): TROPONINI in the last 168 hours. ------------------------------------------------------------------------------------------------------------------  RADIOLOGY:  No results found.  EKG:  Orders placed or performed during the hospital encounter of 07/19/17  . EKG 12-Lead  . EKG 12-Lead    ASSESSMENT AND PLAN:   72 year old female with known history of diastolic CHF, A. fib, COPD, diabetes, GERD, sleep apnea, hypertension, interstitial lung disease,  arthritis presents to hospital secondary to hematuria after recent Botox injection to the bladder for overactive bladder.  1. Hematuria-secondary to recent bladder instrumentation and also Botox injection on 01/26/2018. Also patient takes Coumadin at home. -Appreciate urology consult. -Restarted Coumadin and being bridged with Lovenox  due to significant history of A. fib, DVT and PE, mechanical mitral valve  -Hematuria has cleared. Continuous bladder irrigation is off. According to urology catheter size has been changed. Patient will be discharged with the Foley catheter until seen by urology as outpatient in 1 week.. . -Hemoglobin is stable, no transfusion needed. -On Keflex for UTI  2. Paroxysmal atrial fibrillation-sick sinus syndrome status post pacemaker. -Continue Cardizem and metoprolol. -Coumadin and Lovenox are currently on hold for 24 hours - on propafenone -Appreciate cardiology consult -Has known diastolic CHF. Torsemide dose has been reduced on admission. Restarted torsemide. One dose of IV Lasix extra today  3. Mechanical mitral valve- Coumadin on hold. -Appreciate cardiology consult.-INR is 1.3  4. Interstitial lung disease-follows with rheumatologist as outpatient. Continue CPAP at bedtime. -on Imuran . continue outpatient follow-up. -When necessary oxygen at home. Currently on 2 L  5. Generalized weakness-physical therapy evaluated and recommended SNF  6. Diabetes mellitus-on metformin and sliding scale insulin  7. DVT prophylaxis- on coumadin and lovenox.  Physical therapy recommended rehabilitation. Patient will go to [facility] when stable   All the records are reviewed and case discussed with Care Management/Social Workerr. Management plans discussed with the patient, family and they are in agreement.  CODE STATUS: Full code  TOTAL TIME TAKING CARE OF THIS PATIENT: 37 minutes.   POSSIBLE D/C IN 1-2 DAYS, DEPENDING ON CLINICAL  CONDITION.   Gladstone Lighter M.D on 02/02/2018 at 1:51 PM  Between 7am to 6pm - Pager - [phone]  After 6pm go to [email or web address] - password [email or web address] [facility]  Office  [phone]  CC: Primary care physician; Dion Body, MD

## 2018-02-02 NOTE — Telephone Encounter (Signed)
Appt made. Notified Financial controller of appts.

## 2018-02-02 NOTE — Progress Notes (Signed)
ANTICOAGULATION CONSULT NOTE -follow up  Betty [name] for enoxaparin and warfarin Indication: history of mechanical heart valve - bridge to therapeutic INR  Allergies  Allergen Reactions  . Flecainide Shortness Of Breath and Other (See Comments)    Reaction: dizziness   . Amiodarone Other (See Comments)    Pt states that this medication causes lung bleeding.      Patient Measurements: Height: 5\' 9"  (175.3 cm) Weight: 211 lb 1.6 oz (95.8 kg) IBW/kg (Calculated) : 66.2 Heparin Dosing Weight:   Vital Signs: Temp: 98 F (36.7 C) (02/11 0439) Temp Source: Oral (02/11 0439) BP: 144/84 (02/11 0439) Pulse Rate: 96 (02/11 0441)  Labs: Recent Labs    01/30/18 2122 01/31/18 0558 01/31/18 0559 02/01/18 0615 02/02/18 0527  HGB 9.0*  --  9.0* 9.7*  --   HCT 28.0*  --  28.0* 30.0*  --   PLT 202  --  218 240  --   LABPROT  --  16.1*  --  15.7* 14.6  INR  --  1.30  --  1.26 1.15  CREATININE  --  0.66  --  0.85  --     Estimated Creatinine Clearance: 74.8 mL/min (by C-G formula based on SCr of 0.85 mg/dL).   Medical History: Past Medical History:  Diagnosis Date  . Acute diastolic heart failure ([place])   . Allergy   . 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 Asthma   . Atrial fibrillation ([place])   . Backache, unspecified   . Cardiomegaly   . COPD (chronic obstructive pulmonary disease) ([place])   . Diabetes mellitus without complication ([place])   . 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 Esophageal reflux   . Headache(784.0)   . Herpes zoster without mention of complication   . Hx: UTI (urinary tract infection)   . Hypertension    heart controlled w CHF  . Nontoxic uninodular goiter   . Obesity, unspecified   . Osteoarthrosis, unspecified whether generalized or localized, unspecified site   . Unspecified sleep apnea   . Urine incontinence    hx of    Medications:  Infusions:    Assessment: 71 yof cc hematuria after botox bladder  injection for OAB. Warfarin PTA for AF mechanical valve was reversed with oral vitamin K. Now pharmacy consulted to dose LMWH to bridge to therapeutic INR post cystoscopy.  Home dose: 6mg  daily. Appears pt has bubble pack done by [facility]  2/5   INR           hold 2/6   INR  1.82  Hold 2/7   INR 1.25   Warfarin 6 mg 2/8   INR 1.22   Warfarin 6 mg 2/9   INR 1.30  On HOLD 2/10 INR 1.26  Warfarin 6mg  2/11 INR 1.15  Goal of Therapy:  INR 2.5 to 3.5 due to mechanical mitral valve Monitor platelets by anticoagulation protocol: Yes   Plan:  Continue warfarin 6mg  daily. Continue to bridge with lovenox 1mg /kg q 12hr. Follow up INR/ CBC in the AM  Blaze Sandin D Shun Pletz, Pharm.D., BCPS Clinical Pharmacist 02/02/2018,8:01 AM

## 2018-02-02 NOTE — Progress Notes (Signed)
Physical Therapy Treatment Patient Details Name: Betty [name] MRN: [id number] DOB: April 03, 1946 Today's Date: 02/02/2018    History of Present Illness Pt is a 72 y.o. female admitted following c/o lower abdominal pain and dx of bladder spasm and hematuria.  PMH includes ANCA associated vasculitis, asthma, atrial fibrillation, LBP, cardiomegaly, COPD, DM, diffuse pulmonary alveolar hemorrhage, esophageal reflux, HA, Herpes zoster, UTI, Htn, CHF, nontoxic uninodular goiter, obesity, OA, sleep apnea and urine incontinence.    PT Comments    Pt agreeable to PT; denies pain. Complains of fatigue, weakness and general malaise. Pt ambulates in room with subjective feeling of weakness; noted to fatigue quickly. In general pt demonstrates safe mobility. Participates in seated and long sit exercises and encouraged to continue exercises throughout the day. Continue PT to progress strength and endurance to improve all functional mobility.    Follow Up Recommendations  SNF     Equipment Recommendations       Recommendations for Other Services       Precautions / Restrictions Precautions Precautions: Fall Restrictions Weight Bearing Restrictions: No    Mobility  Bed Mobility Overal bed mobility: Modified Independent                Transfers Overall transfer level: Modified independent                  Ambulation/Gait Ambulation/Gait assistance: Supervision Ambulation Distance (Feet): 25 Feet Assistive device: Rolling walker (2 wheeled) Gait Pattern/deviations: Step-through pattern Gait velocity: decreased Gait velocity interpretation: Below normal speed for age/gender General Gait Details: no LOB or leg buckling; pt subjectively feels very weak in the legs   Stairs            Wheelchair Mobility    Modified Rankin (Stroke Patients Only)       Balance Overall balance assessment: Needs assistance Sitting-balance support: Feet supported Sitting balance-Leahy  Scale: Good     Standing balance support: Bilateral upper extremity supported Standing balance-Leahy Scale: Fair                              Cognition Arousal/Alertness: Awake/alert Behavior During Therapy: WFL for tasks assessed/performed Overall Cognitive Status: Within Functional Limits for tasks assessed                                        Exercises General Exercises - Lower Extremity Quad Sets: Strengthening;Both;15 reps Gluteal Sets: Strengthening;Both;15 reps Long Arc Quad: AROM;Both;Seated;15 reps Heel Slides: AROM;Both;15 reps Hip ABduction/ADduction: AROM;AAROM;Both;10 reps Hip Flexion/Marching: AROM;Both;Seated;15 reps Toe Raises: AROM;Both;Seated;20 reps Heel Raises: AROM;Both;Seated;20 reps    General Comments        Pertinent Vitals/Pain Pain Assessment: No/denies pain    Home Living                      Prior Function            PT Goals (current goals can now be found in the care plan section) Progress towards PT goals: Progressing toward goals    Frequency    Min 2X/week      PT Plan Current plan remains appropriate    Co-evaluation              AM-PAC PT "6 Clicks" Daily Activity  Outcome Measure  Difficulty turning over in bed (including adjusting bedclothes, sheets  and blankets)?: A Little Difficulty moving from lying on back to sitting on the side of the bed? : A Little Difficulty sitting down on and standing up from a chair with arms (e.g., wheelchair, bedside commode, etc,.)?: A Little Help needed moving to and from a bed to chair (including a wheelchair)?: A Little Help needed walking in hospital room?: A Little Help needed climbing 3-5 steps with a railing? : A Lot 6 Click Score: 17    End of Session Equipment Utilized During Treatment: Gait belt;Oxygen Activity Tolerance: Patient limited by fatigue;Other (comment)(weakness) Patient left: in chair;with call bell/phone within  reach;with chair alarm set   PT Visit Diagnosis: Unsteadiness on feet (R26.81);Muscle weakness (generalized) (M62.81)     Time: 0347-4259 PT Time Calculation (min) (ACUTE ONLY): 29 min  Charges:  $Gait Training: 8-22 mins $Therapeutic Exercise: 8-22 mins                    G Codes:        Larae Grooms, PTA 02/02/2018, 6:08 PM

## 2018-02-02 NOTE — Progress Notes (Signed)
Pt watching TV,   No complaints

## 2018-02-02 NOTE — Progress Notes (Signed)
Progress Note  Patient Name: Betty [name] Date of Encounter: 02/02/2018  Primary Cardiologist: [facility]  Subjective   Feels weak and sleepy. SOB about the same, remains on supplemental oxygen. Urine in catheter bag yellow. HGB stable on Lovenox to Coumadin bridge. No chest pain or palpitations.   Inpatient Medications    Scheduled Meds: . atorvastatin  20 mg Oral Daily  . azaTHIOprine  100 mg Oral Daily  . calcium-vitamin D  1 tablet Oral Daily  . diltiazem  180 mg Oral Daily  . enoxaparin (LOVENOX) injection  1 mg/kg Subcutaneous Q12H  . ferrous sulfate  325 mg Oral Daily  . fesoterodine  8 mg Oral Daily  . fluticasone  2 spray Each Nare Daily  . gabapentin  600 mg Oral BID  . insulin aspart  0-9 Units Subcutaneous TID WC  . loratadine  10 mg Oral Daily  . magnesium oxide  400 mg Oral Daily  . metFORMIN  1,000 mg Oral BID WC  . metolazone  2.5 mg Oral Once per day on Mon Thu  .  morphine injection  2 mg Intravenous Once  . pantoprazole  40 mg Oral Daily  . potassium chloride SA  20 mEq Oral Daily  . propafenone  225 mg Oral Q12H  . rOPINIRole  4 mg Oral QHS  . senna-docusate  2 tablet Oral BID  . sodium chloride flush  3 mL Intravenous Q12H  . torsemide  80 mg Oral Daily  . venlafaxine  75 mg Oral TID WC  . Vitamin D (Ergocalciferol)  50,000 Units Oral Q7 days  . warfarin  6 mg Oral q1800  . Warfarin - Pharmacist Dosing Inpatient   Does not apply q1800  . Warfarin - Pharmacist Dosing Inpatient   Does not apply q1800   Continuous Infusions:  PRN Meds: acetaminophen **OR** acetaminophen, diphenhydrAMINE, guaiFENesin, guaiFENesin-dextromethorphan, morphine injection, ondansetron **OR** ondansetron (ZOFRAN) IV, opium-belladonna, oxybutynin, sodium chloride flush, traZODone   Vital Signs    Vitals:   02/01/18 2156 02/01/18 2158 02/02/18 0439 02/02/18 0441  BP:   (!) 144/84   Pulse: 92 93  96  Resp:   18   Temp:   98 F (36.7 C)   TempSrc:   Oral   SpO2: (!) 88%  95% 92% 93%  Weight:   211 lb 1.6 oz (95.8 kg)   Height:        Intake/Output Summary (Last 24 hours) at 02/02/2018 0800 Last data filed at 02/02/2018 0600 Gross per 24 hour  Intake 0 ml  Output 2100 ml  Net -2100 ml   Filed Weights   01/31/18 0348 02/01/18 0443 02/02/18 0439  Weight: 218 lb 8 oz (99.1 kg) 213 lb 9.6 oz (96.9 kg) 211 lb 1.6 oz (95.8 kg)    Telemetry    Paced - Personally Reviewed  ECG    n/a - Personally Reviewed  Physical Exam   GEN: No acute distress.   Neck: No JVD. Cardiac: RRR, mechanical click noted, no rubs, or gallops.  Respiratory: Clear to auscultation bilaterally.  GI: Soft, nontender, non-distended.   MS: No edema; No deformity. Neuro:  Alert and oriented x 3; Nonfocal.  Psych: Normal affect.  Labs    Chemistry Recent Labs  Lab 01/26/18 2157  01/29/18 0456 01/31/18 0558 02/01/18 0615  NA 134*   < > 134* 138 138  K 4.1   < > 3.6 3.3* 3.5  CL 94*   < > 96* 96* 93*  CO2 29   < > 26 31 34*  GLUCOSE 190*   < > 168* 134* 137*  BUN 33*   < > 23* 14 19  CREATININE 1.17*   < > 0.82 0.66 0.85  CALCIUM 8.6*   < > 8.4* 8.6* 8.5*  PROT 7.0  --   --   --   --   ALBUMIN 3.9  --   --   --   --   AST 39  --   --   --   --   ALT 17  --   --   --   --   ALKPHOS 96  --   --   --   --   BILITOT 0.8  --   --   --   --   GFRNONAA 46*   < > >60 >60 >60  GFRAA 53*   < > >60 >60 >60  ANIONGAP 11   < > 12 11 11    < > = values in this interval not displayed.     Hematology Recent Labs  Lab 01/30/18 2122 01/31/18 0559 02/01/18 0615  WBC 9.0 8.5 9.4  RBC 3.13* 3.15* 3.33*  HGB 9.0* 9.0* 9.7*  HCT 28.0* 28.0* 30.0*  MCV 89.5 89.0 90.1  MCH 28.8 28.7 29.0  MCHC 32.2 32.3 32.2  RDW 20.0* 19.7* 20.2*  PLT 202 218 240    Cardiac EnzymesNo results for input(s): TROPONINI in the last 168 hours. No results for input(s): TROPIPOC in the last 168 hours.   BNPNo results for input(s): BNP, PROBNP in the last 168 hours.   DDimer No results for  input(s): DDIMER in the last 168 hours.   Radiology    No results found.  Cardiac Studies   n/a  Patient Profile     72 y.o. female with history of nonobstructive CAD by [facility] 2017, mechanical mitralvalve on warfarin, ANCAassociated vasculitis with prior alveolar hemorrhage,pacemaker placement/CRT, required epicardial lead by thoracotomy, interstitial lung disease, diastolic CHF, sleep apnea onBipap,prior DVT and PEon warfarin,paroxysmal atrial fibrillation/flutter, outpatient Botox injection with Dr. McDermottfor overactive bladder who presented with hematuria, bladder spasms, passing clots.  Assessment & Plan    1. Hematuria: -Status post smaller cathter 2/10 -Urology on board -Seems to be tolerating Lovenox to Coumadin bridge  2. Mechanical mitral valve: -Goal INR 2.5-3.5 -Back on Lovenox to Coumadin bridge -Coumadin per pharmacy  3. PAF/flutter: -Paced on telemetry this morning -Lovenox to Coumadin bridge as above -PTA Diltiazem and Rhythmol   4. Nonobstructive CAD: -No symptoms concerning for angina -On Coumadin in place of ASA -Continue PTA medications -No plans for inpatient ischemic evaluation at this time  5. Chronic diastolic CHF: -She does not appear grossly volume overloaded at this time -Continue PO torsemide  6. SOB: -Suspect this is multifactorial including her anemia and ILD -Less likely fluid overload -Per IM  7. History of DVT/PE: -As above   For questions or updates, please contact [place] Please consult [email or web address] for contact info under Cardiology/STEMI.    Signed, Christell Faith, PA-C [facility] Pager: [phone] 02/02/2018, 8:00 AM

## 2018-02-03 ENCOUNTER — Inpatient Hospital Stay: Payer: Medicare Other

## 2018-02-03 ENCOUNTER — Ambulatory Visit: Payer: Medicare Other

## 2018-02-03 DIAGNOSIS — R319 Hematuria, unspecified: Secondary | ICD-10-CM

## 2018-02-03 DIAGNOSIS — E876 Hypokalemia: Secondary | ICD-10-CM

## 2018-02-03 LAB — BASIC METABOLIC PANEL
ANION GAP: 11 (ref 5–15)
BUN: 22 mg/dL — ABNORMAL HIGH (ref 6–20)
CO2: 36 mmol/L — ABNORMAL HIGH (ref 22–32)
Calcium: 8.6 mg/dL — ABNORMAL LOW (ref 8.9–10.3)
Chloride: 88 mmol/L — ABNORMAL LOW (ref 101–111)
Creatinine, Ser: 0.71 mg/dL (ref 0.44–1.00)
GFR calc Af Amer: >60 mL/min (ref >60)
Glucose, Bld: 102 mg/dL — ABNORMAL HIGH (ref 65–99)
POTASSIUM: 3 mmol/L — AB (ref 3.5–5.1)
SODIUM: 135 mmol/L (ref 135–145)

## 2018-02-03 LAB — CBC
HCT: 30.7 % — ABNORMAL LOW (ref 35.0–47.0)
Hemoglobin: 10 g/dL — ABNORMAL LOW (ref 12.0–16.0)
MCH: 28.5 pg (ref 26.0–34.0)
MCHC: 32.5 g/dL (ref 32.0–36.0)
MCV: 87.9 fL (ref 80.0–100.0)
PLATELETS: 276 10*3/uL (ref 150–440)
RBC: 3.5 MIL/uL — AB (ref 3.80–5.20)
RDW: 19.1 % — ABNORMAL HIGH (ref 11.5–14.5)
WBC: 11.2 10*3/uL — AB (ref 3.6–11.0)

## 2018-02-03 LAB — GLUCOSE, CAPILLARY
GLUCOSE-CAPILLARY: 163 mg/dL — AB (ref 65–99)
GLUCOSE-CAPILLARY: 96 mg/dL (ref 65–99)
Glucose-Capillary: 120 mg/dL — ABNORMAL HIGH (ref 65–99)
Glucose-Capillary: 107 mg/dL — ABNORMAL HIGH (ref 65–99)

## 2018-02-03 LAB — POTASSIUM: POTASSIUM: 3.7 mmol/L (ref 3.5–5.1)

## 2018-02-03 LAB — PROTIME-INR
INR: 1.33
PROTHROMBIN TIME: 16.4 s — AB (ref 11.4–15.2)

## 2018-02-03 MED ORDER — TORSEMIDE 20 MG PO TABS
80.0000 mg | ORAL_TABLET | Freq: Two times a day (BID) | ORAL | Status: DC
  Administered 2018-02-03 – 2018-02-04 (×2): 80 mg via ORAL
  Filled 2018-02-03 (×2): qty 4

## 2018-02-03 MED ORDER — POTASSIUM CHLORIDE CRYS ER 20 MEQ PO TBCR
40.0000 meq | EXTENDED_RELEASE_TABLET | Freq: Once | ORAL | Status: AC
  Administered 2018-02-03: 40 meq via ORAL
  Filled 2018-02-03: qty 2

## 2018-02-03 MED ORDER — INFLUENZA VAC SPLIT HIGH-DOSE 0.5 ML IM SUSY
0.5000 mL | PREFILLED_SYRINGE | Freq: Once | INTRAMUSCULAR | Status: AC
  Administered 2018-02-03: 0.5 mL via INTRAMUSCULAR
  Filled 2018-02-03: qty 0.5

## 2018-02-03 MED ORDER — FUROSEMIDE 10 MG/ML IJ SOLN
40.0000 mg | Freq: Once | INTRAMUSCULAR | Status: AC
  Administered 2018-02-03: 40 mg via INTRAVENOUS
  Filled 2018-02-03: qty 4

## 2018-02-03 NOTE — Progress Notes (Signed)
[facility] [place] NAME: Betty [name]    MR#:  [id number]  DATE OF BIRTH:  08-18-1946  SUBJECTIVE:  CHIEF COMPLAINT:   Chief Complaint  Patient presents with  . Hematuria   -Feels much better today. Still hypoxic on minimal exertion. -On acutely 2 L oxygen  REVIEW OF SYSTEMS:  Review of Systems  Constitutional: Positive for malaise/fatigue. Negative for chills and fever.  HENT: Negative for congestion, ear discharge, hearing loss and nosebleeds.        Dry mouth  Eyes: Negative for blurred vision and double vision.  Respiratory: Positive for cough and shortness of breath. Negative for wheezing.   Cardiovascular: Negative for chest pain, palpitations and leg swelling.  Gastrointestinal: Negative for abdominal pain, constipation, diarrhea, nausea and vomiting.  Genitourinary: Positive for hematuria. Negative for dysuria.  Musculoskeletal: Positive for back pain. Negative for myalgias.  Neurological: Negative for dizziness, speech change, focal weakness, seizures and headaches.  Psychiatric/Behavioral: Negative for depression.    DRUG ALLERGIES:   Allergies  Allergen Reactions  . Flecainide Shortness Of Breath and Other (See Comments)    Reaction: dizziness   . Amiodarone Other (See Comments)    Pt states that this medication causes lung bleeding.      VITALS:  Blood pressure 116/60, pulse 100, temperature 98.4 F (36.9 C), temperature source Oral, resp. rate 18, height 5' 9" (1.753 m), weight 94 kg (207 lb 3.2 oz), SpO2 93 %.  PHYSICAL EXAMINATION:  Physical Exam  GENERAL:  72 y.o.-year-old patient lying in the bed with no acute distress.  . Hematuria   -Feels much better today. Still hypoxic on minimal exertion. -On acutely 2 L oxygen  REVIEW OF SYSTEMS:  Review of Systems  Constitutional: Positive for malaise/fatigue. Negative for chills and fever.  HENT: Negative for congestion, ear discharge, hearing loss and nosebleeds.        Dry mouth  Eyes: Negative for blurred vision and double vision.  Respiratory: Positive for cough and shortness of breath. Negative for wheezing.   Cardiovascular: Negative for chest pain, palpitations and leg swelling.  Gastrointestinal: Negative for abdominal pain, constipation, diarrhea, nausea and vomiting.  Genitourinary: Positive for hematuria. Negative for dysuria.  Musculoskeletal: Positive for back pain. Negative for myalgias.  Neurological: Negative for dizziness, speech change, focal weakness, seizures and headaches.  Psychiatric/Behavioral: Negative for depression.    DRUG ALLERGIES:   Allergies  Allergen Reactions  . Flecainide Shortness Of Breath and Other (See Comments)    Reaction: dizziness   . Amiodarone Other (See Comments)    Pt states that this medication causes lung bleeding.      VITALS:  Blood pressure 116/60, pulse 100, temperature 98.4 F (36.9 C), temperature source Oral, resp. rate 18, height 5\' 9"  (1.753 m), weight 94 kg (207 lb 3.2 oz), SpO2 93 %.  PHYSICAL EXAMINATION:  Physical Exam  GENERAL:  72 y.o.-year-old patient lying in the bed with no acute distress.  EYES: Pupils equal, round, reactive to light and accommodation. No scleral icterus. Extraocular muscles intact.  HEENT: Head atraumatic, normocephalic. Oropharynx and nasopharynx clear.  NECK:  Supple, no jugular venous distention. No thyroid enlargement, no tenderness.  LUNGS: Normal  breath sounds bilaterally, no wheezing, rhonchi or crepitation. Bibasilar crackles heard which is chronic. Using her accessory muscles on exertion CARDIOVASCULAR: S1, S2 normal. No rubs, or gallops. 3/6 systolic murmur present. Pacemaker in place ABDOMEN: Soft, nontender, nondistended. Bowel sounds present. No organomegaly or mass.  EXTREMITIES: No pedal edema, cyanosis, or clubbing.  NEUROLOGIC: Cranial nerves II through XII are intact. Muscle strength 5/5 in all extremities. Sensation intact. Gait not checked. Global weakness noted PSYCHIATRIC: The patient is alert and oriented x 3.  SKIN: No obvious rash, lesion, or ulcer.    LABORATORY PANEL:   CBC Recent Labs  Lab 02/03/18 0613  WBC 11.2*  HGB 10.0*  HCT 30.7*  PLT 276   ------------------------------------------------------------------------------------------------------------------  Chemistries  Recent Labs  Lab 02/03/18 0613  NA 135  K 3.0*  CL 88*  CO2 36*  GLUCOSE 102*  BUN 22*  CREATININE 0.71  CALCIUM 8.6*   ------------------------------------------------------------------------------------------------------------------  Cardiac Enzymes No results for input(s): TROPONINI in the last 168 hours. ------------------------------------------------------------------------------------------------------------------  RADIOLOGY:  Dg Chest 2 View  Result Date: 02/03/2018 CLINICAL DATA:  CHF EXAM: CHEST  2 VIEW COMPARISON:  01/31/2018 chest radiograph. FINDINGS: Stable configuration of 2 lead left subclavian pacemaker, intact sternotomy wires and cardiac valvular prosthesis. Stable cardiomediastinal silhouette with mild cardiomegaly. No pneumothorax. Small bilateral pleural effusions, right greater than left, stable. Mild pulmonary edema, slightly improved. Stable bibasilar atelectasis. IMPRESSION: 1. Mild congestive heart failure, slightly improved. 2. Stable small bilateral pleural effusions, right greater than left. 3.  Stable bibasilar atelectasis. Electronically Signed   By: Ilona Sorrel M.D.   On: 02/03/2018 10:41    EKG:  Orders placed or performed during the hospital encounter of 07/19/17  . EKG 12-Lead  . EKG 12-Lead    ASSESSMENT AND PLAN:   72 year old female with known history of diastolic CHF, A. fib, COPD, diabetes, GERD, sleep apnea, hypertension, interstitial lung disease, arthritis presents to hospital secondary to hematuria after recent Botox injection to the bladder for overactive bladder.  1. Hematuria-secondary to recent bladder instrumentation and also Botox injection on 01/26/2018. Also patient takes Coumadin at home. -Appreciate urology consult. -Restarted Coumadin and being bridged with Lovenox  due to significant history of A. fib, DVT and PE, mechanical mitral valve  -Hematuria has cleared. Continuous bladder irrigation is off. According to urology catheter size has been changed. Patient will be discharged with the Foley catheter until seen by urology as outpatient in 1 week.. . -Hemoglobin is stable, no transfusion needed. -Received Keflex for UTI  2. Paroxysmal atrial fibrillation-sick sinus syndrome status post pacemaker. -Continue Cardizem and metoprolol. -On Coumadin for anticoagulation. Being bridged with Lovenox - on propafenone -Appreciate cardiology consult -Has known diastolic CHF. Restarted torsemide twice a day. Also on metolazone 3 times a week.  - One dose of IV Lasix extra today, chest x-ray with improving pulmonary edema  3. Mechanical mitral valve- Coumadin, being bridged with Lovenox. -Appreciate cardiology consult.-INR is 1.3  4. Interstitial lung disease-follows with rheumatologist as outpatient. Continue CPAP at bedtime. -on Imuran . continue outpatient follow-up. -When necessary oxygen at home. Currently on 2 L  5. Generalized weakness-physical therapy evaluated and recommended SNF  6. Diabetes mellitus-on metformin and sliding scale insulin  7.  DVT prophylaxis- on coumadin and lovenox.  Physical therapy recommended rehabilitation. Patient will go [facility] tomorrow   All the records are reviewed and case discussed with Care Management/Social Workerr. Management plans discussed with the patient, family and they are in agreement.  CODE STATUS: Full code  TOTAL TIME TAKING CARE OF THIS PATIENT: 38 minutes.   POSSIBLE D/C tomorrow, DEPENDING ON CLINICAL CONDITION.   Gladstone Lighter M.D on 02/03/2018 at 12:51 PM  Between 7am to 6pm - Pager - [phone]  After 6pm go to [email or web address] - password [email or web address] [place] [facility]  Office  [phone]  CC: Primary care physician; Dion Body, MD

## 2018-02-03 NOTE — Care Management Important Message (Signed)
Important Message  Patient Details  Name: Betty [name] MRN: [id number] Date of Birth: [birth year]   Medicare Important Message Given:  Yes Signed IM notice given    Katrina Stack, RN 02/03/2018, 1:57 PM

## 2018-02-03 NOTE — Progress Notes (Signed)
ANTICOAGULATION CONSULT NOTE -follow up  [place] for enoxaparin and warfarin Indication: history of mechanical heart valve - bridge to therapeutic INR  Allergies  Allergen Reactions  . Flecainide Shortness Of Breath and Other (See Comments)    Reaction: dizziness   . Amiodarone Other (See Comments)    Pt states that this medication causes lung bleeding.      Patient Measurements: Height: 5\' 9"  (175.3 cm) Weight: 207 lb 3.2 oz (94 kg) IBW/kg (Calculated) : 66.2 Heparin Dosing Weight:   Vital Signs: Temp: 98.3 F (36.8 C) (02/12 0604) Temp Source: Oral (02/12 0604) BP: 138/65 (02/12 0604) Pulse Rate: 89 (02/12 0604)  Labs: Recent Labs    02/01/18 0615 02/02/18 0527 02/03/18 0613  HGB 9.7*  --  10.0*  HCT 30.0*  --  30.7*  PLT 240  --  276  LABPROT 15.7* 14.6 16.4*  INR 1.26 1.15 1.33  CREATININE 0.85  --  0.71    Estimated Creatinine Clearance: 78.7 mL/min (by C-G formula based on SCr of 0.71 mg/dL).   Medical History: Past Medical History:  Diagnosis Date  . Acute diastolic heart failure ([place])   . Allergy   . 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 Asthma   . Atrial fibrillation ([place])   . Backache, unspecified   . Cardiomegaly   . COPD (chronic obstructive pulmonary disease) ([place])   . Diabetes mellitus without complication ([place])   . 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 Esophageal reflux   . Headache(784.0)   . Herpes zoster without mention of complication   . Hx: UTI (urinary tract infection)   . Hypertension    heart controlled w CHF  . Nontoxic uninodular goiter   . Obesity, unspecified   . Osteoarthrosis, unspecified whether generalized or localized, unspecified site   . Unspecified sleep apnea   . Urine incontinence    hx of    Medications:  Infusions:    Assessment: 71 yof cc hematuria after botox bladder injection for OAB. Warfarin PTA for AF mechanical valve was reversed with oral vitamin K.  Now pharmacy consulted to dose LMWH to bridge to therapeutic INR post cystoscopy.  Home dose: 6mg  daily. Appears pt has bubble pack done by [facility]  2/5   INR           hold 2/6   INR  1.82  Hold 2/7   INR 1.25   Warfarin 6 mg 2/8   INR 1.22   Warfarin 6 mg 2/9   INR 1.30  On HOLD 2/10 INR 1.26  Warfarin 6mg  2/11 INR 1.15  Warfarin 6mg  2/12 INR 1.33  Goal of Therapy:  INR 2.5 to 3.5 due to mechanical mitral valve Monitor platelets by anticoagulation protocol: Yes   Plan:  Continue warfarin 6mg  daily. Continue to bridge with lovenox 1mg /kg q 12hr. Follow up INR/ CBC in the AM  Krisy Dix D Rowene Suto, Pharm.D., BCPS Clinical Pharmacist 02/03/2018,7:23 AM

## 2018-02-03 NOTE — Progress Notes (Signed)
O2 sats on 2 L [place] 95%.  Trial on room air, 78%.  Oxygen reapplied and sats returned to 93%.  Up to BR without oxygen.  Sats when she returned to bed. 61%.  Tubing extended so she doesn't take off her oxygen.

## 2018-02-03 NOTE — Progress Notes (Signed)
Progress Note  Patient Name: Betty [name] Date of Encounter: 02/03/2018  Primary Cardiologist: [facility]  Subjective   Less SOB today. No chest pain. More alert. HGB stable to slightly improved 9.7-->10.0. INR 1.33. Remains on Lovenox to Coumadin bridge. Potassium low at 3.0.    Inpatient Medications    Scheduled Meds: . atorvastatin  20 mg Oral Daily  . azaTHIOprine  100 mg Oral Daily  . calcium-vitamin D  1 tablet Oral Daily  . diltiazem  180 mg Oral Daily  . enoxaparin (LOVENOX) injection  1 mg/kg Subcutaneous Q12H  . ferrous sulfate  325 mg Oral Daily  . fesoterodine  8 mg Oral Daily  . fluticasone  2 spray Each Nare Daily  . gabapentin  600 mg Oral BID  . insulin aspart  0-9 Units Subcutaneous TID WC  . loratadine  10 mg Oral Daily  . magnesium oxide  400 mg Oral Daily  . metFORMIN  1,000 mg Oral BID WC  . metolazone  2.5 mg Oral Once per day on Mon Thu  .  morphine injection  2 mg Intravenous Once  . pantoprazole  40 mg Oral Daily  . potassium chloride SA  20 mEq Oral Daily  . potassium chloride  40 mEq Oral Once  . propafenone  225 mg Oral Q12H  . rOPINIRole  4 mg Oral QHS  . senna-docusate  2 tablet Oral BID  . sodium chloride flush  3 mL Intravenous Q12H  . torsemide  80 mg Oral Daily  . venlafaxine  75 mg Oral TID WC  . Vitamin D (Ergocalciferol)  50,000 Units Oral Q7 days  . warfarin  6 mg Oral q1800  . Warfarin - Pharmacist Dosing Inpatient   Does not apply q1800  . Warfarin - Pharmacist Dosing Inpatient   Does not apply q1800   Continuous Infusions:  PRN Meds: acetaminophen **OR** acetaminophen, diphenhydrAMINE, guaiFENesin, guaiFENesin-dextromethorphan, morphine injection, ondansetron **OR** ondansetron (ZOFRAN) IV, opium-belladonna, oxybutynin, sodium chloride flush, traZODone   Vital Signs    Vitals:   02/02/18 1727 02/02/18 1941 02/03/18 0604 02/03/18 0849  BP:  (!) 121/53 138/65 116/60  Pulse: 99 91 89 100  Resp:  18 18 18   Temp:  98.7 F (37.1  C) 98.3 F (36.8 C) 98.4 F (36.9 C)  TempSrc:  Oral Oral Oral  SpO2: 96% 97% 90% 90%  Weight:   207 lb 3.2 oz (94 kg)   Height:        Intake/Output Summary (Last 24 hours) at 02/03/2018 0942 Last data filed at 02/03/2018 0711 Gross per 24 hour  Intake -  Output 4350 ml  Net -4350 ml   Filed Weights   02/01/18 0443 02/02/18 0439 02/03/18 0604  Weight: 213 lb 9.6 oz (96.9 kg) 211 lb 1.6 oz (95.8 kg) 207 lb 3.2 oz (94 kg)    Telemetry    paced - Personally Reviewed  ECG    n/a - Personally Reviewed  Physical Exam   GEN: No acute distress.   Neck: No JVD. Cardiac: RRR, crisp mechanical click noted, no rubs, or gallops.  Respiratory: Clear to auscultation bilaterally.  GI: Soft, nontender, non-distended.   MS: No edema; No deformity. Neuro:  Alert and oriented x 3; Nonfocal.  Psych: Normal affect.  Labs    Chemistry Recent Labs  Lab 01/31/18 0558 02/01/18 0615 02/03/18 0613  NA 138 138 135  K 3.3* 3.5 3.0*  CL 96* 93* 88*  CO2 31 34* 36*  GLUCOSE  134* 137* 102*  BUN 14 19 22*  CREATININE 0.66 0.85 0.71  CALCIUM 8.6* 8.5* 8.6*  GFRNONAA >60 >60 >60  GFRAA >60 >60 >60  ANIONGAP 11 11 11      Hematology Recent Labs  Lab 01/31/18 0559 02/01/18 0615 02/03/18 0613  WBC 8.5 9.4 11.2*  RBC 3.15* 3.33* 3.50*  HGB 9.0* 9.7* 10.0*  HCT 28.0* 30.0* 30.7*  MCV 89.0 90.1 87.9  MCH 28.7 29.0 28.5  MCHC 32.3 32.2 32.5  RDW 19.7* 20.2* 19.1*  PLT 218 240 276    Cardiac EnzymesNo results for input(s): TROPONINI in the last 168 hours. No results for input(s): TROPIPOC in the last 168 hours.   BNPNo results for input(s): BNP, PROBNP in the last 168 hours.   DDimer No results for input(s): DDIMER in the last 168 hours.   Radiology    No results found.  Cardiac Studies   n/a  Patient Profile     72 y.o. female with history of nonobstructive CAD by [facility] 2017, mechanical mitralvalve on warfarin, ANCAassociated vasculitis with prior alveolar  hemorrhage,pacemaker placement/CRT, required epicardial lead by thoracotomy, interstitial lung disease, diastolic CHF, sleep apnea onBipap,prior DVT and PEon warfarin,paroxysmal atrial fibrillation/flutter, outpatient Botox injection with Dr. McDermottfor overactive bladder who presented with hematuria, bladder spasms, passing clots.  Assessment & Plan    1. Hematuria: -Status post smaller cathter 2/10 -Urology on board, planning to leave Foley in place for outpatient voiding trial in an effort to reduce over distension of the bladder -Seems to be tolerating Lovenox to Coumadin bridge  2. Mechanical mitral valve: -Goal INR 2.5-3.5 -Back on Lovenox to Coumadin bridge -Coumadin per pharmacy -May be able to to Lovenox to Coumadin bridge as an outpatient if she is comfortable with giving herself injections and if she has known close follow up with her PCP (manages her INR)  3. PAF/flutter: -Paced on telemetry this morning -Lovenox to Coumadin bridge as above -PTA Diltiazem and Rhythmol   4. Nonobstructive CAD: -No symptoms concerning for angina -On Coumadin in place of ASA -Continue PTA medications -No plans for inpatient ischemic evaluation at this time  5. Chronic diastolic CHF/SOB: -Much improved -She does not appear grossly volume overloaded at this time -Continue PO torsemide  6. SOB: -Suspect this is multifactorial including her anemia and ILD -Less likely fluid overload -Per IM  7. History of DVT/PE: -As above    For questions or updates, please contact [place] Please consult [email or web address] for contact info under Cardiology/STEMI.    Signed, Christell Faith, PA-C [place] Pager: [phone] 02/03/2018, 9:42 AM

## 2018-02-04 LAB — PROTIME-INR
INR: 1.43
PROTHROMBIN TIME: 17.3 s — AB (ref 11.4–15.2)

## 2018-02-04 LAB — BASIC METABOLIC PANEL
ANION GAP: 14 (ref 5–15)
BUN: 26 mg/dL — ABNORMAL HIGH (ref 6–20)
CALCIUM: 8.3 mg/dL — AB (ref 8.9–10.3)
CO2: 32 mmol/L (ref 22–32)
CREATININE: 0.93 mg/dL (ref 0.44–1.00)
Chloride: 89 mmol/L — ABNORMAL LOW (ref 101–111)
Glucose, Bld: 136 mg/dL — ABNORMAL HIGH (ref 65–99)
Potassium: 3.4 mmol/L — ABNORMAL LOW (ref 3.5–5.1)
SODIUM: 135 mmol/L (ref 135–145)

## 2018-02-04 LAB — GLUCOSE, CAPILLARY
GLUCOSE-CAPILLARY: 123 mg/dL — AB (ref 65–99)
GLUCOSE-CAPILLARY: 176 mg/dL — AB (ref 65–99)

## 2018-02-04 MED ORDER — GUAIFENESIN-DM 100-10 MG/5ML PO SYRP: 5.0000 mL | ORAL_SOLUTION | ORAL | 0 refills | Status: DC | PRN

## 2018-02-04 MED ORDER — WARFARIN SODIUM 4 MG PO TABS
8.0000 mg | ORAL_TABLET | Freq: Once | ORAL | Status: DC
  Filled 2018-02-04: qty 2

## 2018-02-04 MED ORDER — TRAZODONE HCL 150 MG PO TABS: 150.0000 mg | ORAL_TABLET | Freq: Every evening | ORAL | 0 refills | Status: DC | PRN

## 2018-02-04 MED ORDER — WARFARIN SODIUM 6 MG PO TABS
6.0000 mg | ORAL_TABLET | Freq: Every day | ORAL | Status: DC
Start: 2018-02-05 — End: 2018-02-04

## 2018-02-04 MED ORDER — ENOXAPARIN SODIUM 100 MG/ML ~~LOC~~ SOLN: 1.0000 mg/kg | Freq: Two times a day (BID) | SUBCUTANEOUS | 0 refills | Status: DC

## 2018-02-04 MED ORDER — BELLADONNA ALKALOIDS-OPIUM 16.2-60 MG RE SUPP: 1.0000 | Freq: Four times a day (QID) | RECTAL | 0 refills | Status: DC | PRN

## 2018-02-04 MED ORDER — ACETAMINOPHEN 325 MG PO TABS: 650.0000 mg | ORAL_TABLET | Freq: Four times a day (QID) | ORAL | 0 refills | Status: DC | PRN

## 2018-02-04 MED ORDER — POTASSIUM CHLORIDE CRYS ER 20 MEQ PO TBCR: 20.0000 meq | EXTENDED_RELEASE_TABLET | Freq: Two times a day (BID) | ORAL | 0 refills | Status: DC

## 2018-02-04 NOTE — Plan of Care (Signed)
  Progressing Activity: Risk for activity intolerance will decrease 02/04/2018 0436 - Progressing by Loran Senters, RN Elimination: Will not experience complications related to urinary retention 02/04/2018 0436 - Progressing by Loran Senters, RN Pain Managment: General experience of comfort will improve 02/04/2018 0436 - Progressing by Loran Senters, RN

## 2018-02-04 NOTE — Clinical Social Work Placement (Signed)
   CLINICAL SOCIAL WORK PLACEMENT  NOTE  Date:  02/04/2018  Patient Details  Name: Betty [name] MRN: [id number] Date of Birth: 05-Aug-1946  Clinical Social Work is seeking post-discharge placement for this patient at the [place] level of care (*CSW will initial, date and re-position this form in  chart as items are completed):  Yes   Patient/family provided with [place] Work Department's list of facilities offering this level of care within the geographic area requested by the patient (or if unable, by the patient's family).  Yes   Patient/family informed of their freedom to choose among providers that offer the needed level of care, that participate in Medicare, Medicaid or managed care program needed by the patient, have an available bed and are willing to accept the patient.  Yes   Patient/family informed of [facility]'s ownership interest in [facility] and [facility], as well as of the fact that they are under no obligation to receive care at these facilities.  PASRR submitted to EDS on 01/30/18     PASRR number received on       Existing PASRR number confirmed on 01/30/18     FL2 transmitted to all facilities in geographic area requested by pt/family on 01/30/18     FL2 transmitted to all facilities within larger geographic area on       Patient informed that his/her managed care company has contracts with or will negotiate with certain facilities, including the following:        Yes   Patient/family informed of bed offers received.  Patient chooses bed at [facility]     Physician recommends and patient chooses bed at      Patient to be transferred to [facility] on  .  Patient to be transferred to facility by [facility]     Patient family notified on 02/04/18 of transfer.  Name of family member notified:  Patient will notify her son [name].     PHYSICIAN Please sign FL2      Additional Comment:    _______________________________________________ Ross Ludwig, LCSWA 02/04/2018, 11:15 AM

## 2018-02-04 NOTE — Progress Notes (Signed)
Patient alert and oriented, on [facility].  No complaints of pain.  Chronic foley.  [facility]  Escorted out hospital via wheelchair by volunteer by EMS.

## 2018-02-04 NOTE — Discharge Summary (Signed)
[facility] [place] NAME: Betty [name]    MR#:  [id number]  DATE OF BIRTH:  1946/02/23  DATE OF ADMISSION:  01/26/2018   ADMITTING PHYSICIAN: Saundra Shelling, MD  DATE OF DISCHARGE: 02/04/18  PRIMARY CARE PHYSICIAN: Dion Body, MD   ADMISSION DIAGNOSIS:   Gross hematuria [R31.0] Bladder spasm [N32.89]  DISCHARGE DIAGNOSIS:   Active Problems:   Hematuria   SECONDARY DIAGNOSIS:   Past Medical History:  Diagnosis Date  . Acute diastolic heart failure ([place])   . Allergy   . ANCA-associated vasculitis ([place])   . Asthma   . Atrial fibrillation ([place])   . Backache, unspecified   . Cardiomegaly   . COPD (chronic obstructive pulmonary disease) ([place])   . Diabetes mellitus without complication ([place])   . 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 Esophageal reflux   . Headache(784.0)   . Herpes zoster without mention of complication   . Hx: UTI (urinary tract infection)   . Hypertension    heart controlled w CHF  . Nontoxic uninodular goiter   . Obesity, unspecified   . Osteoarthrosis, unspecified whether generalized or localized, unspecified site   . Unspecified sleep apnea   . Urine incontinence    hx of    HOSPITAL COURSE:   72 year old female with known history of diastolic CHF, A. fib, COPD, diabetes, GERD, sleep apnea, hypertension, interstitial lung disease, arthritis presents to hospital secondary to hematuria after recent Botox injection to the bladder for overactive bladder.  1. Hematuria-secondary to recent bladder instrumentation and also Botox injection on 01/26/2018. Also patient takes Coumadin at home. -Appreciate urology consult. -Restarted Coumadin and being bridged with Lovenox  due to significant history of A. fib, DVT and PE, mechanical mitral valve  -Hematuria has cleared. Continuous bladder irrigation is off. According to urology catheter size has been changed. Patient will be  discharged with the Foley catheter until seen by urology as outpatient in 1 week.. . -Hemoglobin is stable, no transfusion needed. -Received Keflex for UTI  2. Paroxysmal atrial fibrillation-sick sinus syndrome status post pacemaker. -Continue Cardizem and metoprolol. -On Coumadin for anticoagulation. Being bridged with Lovenox - on propafenone -Appreciate cardiology consult -Has known diastolic CHF. Restarted torsemide twice a day. Also on metolazone 3 times a week.  -Please stop Lovenox once INR goal has reached.  Goal INR is 2.5-3.5. -INR check in 1-2 days  3. Mechanical mitral valve- Coumadin, being bridged with Lovenox. -Appreciate cardiology consult.-INR is 1.4  4. Interstitial lung disease-follows with rheumatologist as outpatient. Continue CPAP at bedtime. -on Imuran . continue outpatient follow-up. -When necessary oxygen at home. Currently on 2 L  5. Generalized weakness-physical therapy evaluated and recommended SNF  6. Diabetes mellitus-on metformin   Physical therapy recommended rehabilitation. [facility]    DISCHARGE CONDITIONS:   Guarded  CONSULTS OBTAINED:   Treatment Team:  Hollice Espy, MD  DRUG ALLERGIES:   Allergies  Allergen Reactions  . Flecainide Shortness Of Breath and Other (See Comments)    Reaction: dizziness   . Amiodarone Other (See Comments)    Pt states that this medication causes lung bleeding.     DISCHARGE MEDICATIONS:   Allergies as of 02/04/2018      Reactions   Flecainide Shortness Of Breath, Other (See Comments)   Reaction: dizziness    Amiodarone Other (See Comments)   Pt states that this medication causes lung  bleeding.        Medication List    STOP taking these medications   aspirin EC 81 MG tablet   ciprofloxacin 250 MG tablet Commonly known as:  CIPRO   fesoterodine 8 MG Tb24 tablet Commonly known as:  TOVIAZ   nitrofurantoin 100 MG capsule Commonly known as:   MACRODANTIN   spironolactone 50 MG tablet Commonly known as:  ALDACTONE   trimethoprim 100 MG tablet Commonly known as:  TRIMPEX     TAKE these medications   acetaminophen 325 MG tablet Commonly known as:  TYLENOL Take 2 tablets (650 mg total) by mouth every 6 (six) hours as needed for mild pain (or Fever >/= 101).   albuterol 108 (90 Base) MCG/ACT inhaler Commonly known as:  PROVENTIL HFA;VENTOLIN HFA Inhale 2 puffs into the lungs every 4 (four) hours as needed for wheezing or shortness of breath.   albuterol (2.5 MG/3ML) 0.083% nebulizer solution Commonly known as:  PROVENTIL Take 3 mLs (2.5 mg total) by nebulization every 4 (four) hours as needed for wheezing or shortness of breath.   atorvastatin 20 MG tablet Commonly known as:  LIPITOR Take 20 mg by mouth daily.   azaTHIOprine 50 MG tablet Commonly known as:  IMURAN Take 100 mg by mouth daily.   calcium-vitamin D 500-200 MG-UNIT tablet Commonly known as:  OSCAL WITH D Take 1 tablet by mouth daily.   diltiazem 180 MG 24 hr capsule Commonly known as:  CARDIZEM CD Take 180 mg by mouth daily.   enoxaparin 100 MG/ML injection Commonly known as:  LOVENOX Inject 0.95 mLs (95 mg total) into the skin every 12 (twelve) hours. X 3-4 days until INR reaches goal of 2.5   ferrous sulfate 325 (65 FE) MG EC tablet Take 325 mg by mouth daily.   fluticasone 50 MCG/ACT nasal spray Commonly known as:  FLONASE Place 2 sprays into the nose daily.   gabapentin 600 MG tablet Commonly known as:  NEURONTIN Take 600 mg by mouth 2 (two) times daily.   guaiFENesin-dextromethorphan 100-10 MG/5ML syrup Commonly known as:  ROBITUSSIN DM Take 5 mLs by mouth every 4 (four) hours as needed for cough.   Lancets 30G Misc Use 1 Units as directed. Check CBG's fasting once daily. Dx: E11.9   loratadine 10 MG tablet Commonly known as:  CLARITIN Take 10 mg by mouth daily.   magnesium oxide 400 (241.3 Mg) MG tablet Commonly known as:   MAG-OX TAKE 1 TABLET (400 MG TOTAL) BY MOUTH ONCE DAILY.   metFORMIN 1000 MG tablet Commonly known as:  GLUCOPHAGE Take 1,000 mg by mouth 2 (two) times daily with a meal.   metolazone 2.5 MG tablet Commonly known as:  ZAROXOLYN Take 2.5 mg by mouth 2 (two) times a week. Take 2.5 mg by mouth on Monday and Thursday.   metoprolol succinate 25 MG 24 hr tablet Commonly known as:  TOPROL-XL Take 25 mg by mouth daily.   opium-belladonna 16.2-60 MG suppository Commonly known as:  B&O SUPPRETTES Place 1 suppository rectally every 6 (six) hours as needed for bladder spasms.   oxybutynin 5 MG tablet Commonly known as:  DITROPAN Take 5 mg by mouth 3 (three) times daily.   pantoprazole 40 MG tablet Commonly known as:  PROTONIX Take 40 mg by mouth daily.   potassium chloride SA 20 MEQ tablet Commonly known as:  K-DUR,KLOR-CON Take 1 tablet (20 mEq total) by mouth 2 (two) times daily. What changed:  when to take this  propafenone 225 MG tablet Commonly known as:  RYTHMOL Take 225 mg by mouth every 12 (twelve) hours.   rOPINIRole 4 MG tablet Commonly known as:  REQUIP Take 4 mg by mouth at bedtime.   SENNA-PLUS 8.6-50 MG tablet Generic drug:  senna-docusate Take 2 tablets by mouth 2 (two) times daily.   torsemide 20 MG tablet Commonly known as:  DEMADEX Take 80 mg by mouth 2 (two) times daily.   traZODone 150 MG tablet Commonly known as:  DESYREL Take 1 tablet (150 mg total) by mouth at bedtime as needed for sleep. What changed:    medication strength  how much to take  when to take this  reasons to take this   venlafaxine 75 MG tablet Commonly known as:  EFFEXOR Take 75 mg by mouth 3 (three) times daily.   Vitamin D (Ergocalciferol) 50000 units Caps capsule Commonly known as:  DRISDOL Take 50,000 Units by mouth every 7 (seven) days. Mondays only   warfarin 6 MG tablet Commonly known as:  COUMADIN Take 6 mg by mouth daily at 6 PM. What changed:  Another  medication with the same name was removed. Continue taking this medication, and follow the directions you see here.        DISCHARGE INSTRUCTIONS:   1. PCP f/u in 1 week 2. Cardiology f/u in 1-2 weeks 3. Needs INR check in 2 days and daily to assess and goal INR is between 2.5 to 3.5- please stop lovenox injections once goal is reached and check INRs thereafter to adjust her coumadin dose accordingly 4. Patient will be discharged with the foley, flush as needed 5. Urology f/u in 1 week  DIET:   Cardiac diet  ACTIVITY:   Activity as tolerated  OXYGEN:   Home Oxygen: Yes.    Oxygen Delivery: 2 liters/min via Patient connected to nasal cannula oxygen  DISCHARGE LOCATION:   nursing home   If you experience worsening of your admission symptoms, develop shortness of breath, life threatening emergency, suicidal or homicidal thoughts you must seek medical attention immediately by calling 911 or calling your MD immediately  if symptoms less severe.  You Must read complete instructions/literature along with all the possible adverse reactions/side effects for all the Medicines you take and that have been prescribed to you. Take any new Medicines after you have completely understood and accpet all the possible adverse reactions/side effects.   Please note  You were cared for by a hospitalist during your hospital stay. If you have any questions about your discharge medications or the care you received while you were in the hospital after you are discharged, you can call the unit and asked to speak with the hospitalist on call if the hospitalist that took care of you is not available. Once you are discharged, your primary care physician will handle any further medical issues. Please note that NO REFILLS for any discharge medications will be authorized once you are discharged, as it is imperative that you return to your primary care physician (or establish a relationship with a primary care  physician if you do not have one) for your aftercare needs so that they can reassess your need for medications and monitor your lab values.    On the day of Discharge:  VITAL SIGNS:   Blood pressure 128/60, pulse 97, temperature 98.7 F (37.1 C), temperature source Oral, resp. rate 20, height 5\' 9"  (1.753 m), weight 92.7 kg (204 lb 6.4 oz), SpO2 97 %.  PHYSICAL EXAMINATION:  GENERAL:  72 y.o.-year-old patient lying in the bed with no acute distress.  EYES: Pupils equal, round, reactive to light and accommodation. No scleral icterus. Extraocular muscles intact.  HEENT: Head atraumatic, normocephalic. Oropharynx and nasopharynx clear.  NECK:  Supple, no jugular venous distention. No thyroid enlargement, no tenderness.  LUNGS: Normal breath sounds bilaterally, no wheezing, rhonchi or crepitation. Bibasilar crackles heard which is chronic. Using her accessory muscles on exertion CARDIOVASCULAR: S1, S2 normal. No rubs, or gallops. 3/6 systolic murmur present. Pacemaker in place ABDOMEN: Soft, nontender, nondistended. Bowel sounds present. No organomegaly or mass.  EXTREMITIES: No pedal edema, cyanosis, or clubbing.  NEUROLOGIC: Cranial nerves II through XII are intact. Muscle strength 5/5 in all extremities. Sensation intact. Gait not checked. Global weakness noted PSYCHIATRIC: The patient is alert and oriented x 3.  SKIN: No obvious rash, lesion, or ulcer.      DATA REVIEW:   CBC Recent Labs  Lab 02/03/18 0613  WBC 11.2*  HGB 10.0*  HCT 30.7*  PLT 276    Chemistries  Recent Labs  Lab 02/04/18 0530  NA 135  K 3.4*  CL 89*  CO2 32  GLUCOSE 136*  BUN 26*  CREATININE 0.93  CALCIUM 8.3*     Microbiology Results  Results for orders placed or performed during the hospital encounter of 01/26/18  MRSA PCR Screening     Status: None   Collection Time: 01/27/18  5:58 PM  Result Value Ref Range Status   MRSA by PCR NEGATIVE NEGATIVE Final    Comment:        The  GeneXpert MRSA Assay (FDA approved for NASAL specimens only), is one component of a comprehensive MRSA colonization surveillance program. It is not intended to diagnose MRSA infection nor to guide or monitor treatment for MRSA infections. Performed at [facility], [address], [place], [place] [address]   Urine Culture     Status: Abnormal   Collection Time: 01/30/18  1:12 PM  Result Value Ref Range Status   Specimen Description   Final    URINE, RANDOM Performed at [facility], [address] [place], [place] [address]    Special Requests   Final    NONE Performed at [facility], [address] [place], [place] [address]    Culture (A)  Final    <10,000 COLONIES/mL INSIGNIFICANT GROWTH Performed at [facility] [address] [place], [place] [address]    Report Status 02/01/2018 FINAL  Final    RADIOLOGY:  Dg Chest 2 View  Result Date: 02/03/2018 CLINICAL DATA:  CHF EXAM: CHEST  2 VIEW COMPARISON:  01/31/2018 chest radiograph. FINDINGS: Stable configuration of 2 lead left subclavian pacemaker, intact sternotomy wires and cardiac valvular prosthesis. Stable cardiomediastinal silhouette with mild cardiomegaly. No pneumothorax. Small bilateral pleural effusions, right greater than left, stable. Mild pulmonary edema, slightly improved. Stable bibasilar atelectasis. IMPRESSION: 1. Mild congestive heart failure, slightly improved. 2. Stable small bilateral pleural effusions, right greater than left. 3. Stable bibasilar atelectasis. Electronically Signed   By: Ilona Sorrel M.D.   On: 02/03/2018 10:41     Management plans discussed with the patient, family and they are in agreement.  CODE STATUS:     Code Status Orders  (From admission, onward)        Start     Ordered   01/27/18 0603  Full code  Continuous     01/27/18 0603    Code Status History  Date Active Date Inactive Code Status Order ID Comments User Context    07/20/2017 04:37 07/20/2017 19:40 Full Code [id number]  Harrie Foreman, MD Inpatient   05/25/2017 19:51 05/27/2017 17:34 Full Code [id number]  Gladstone Lighter, MD Inpatient   03/27/2017 22:51 04/09/2017 15:49 Full Code [id number]  Lance Coon, MD Inpatient   06/26/2016 01:06 06/28/2016 16:39 Full Code [id number]  Lance Coon, MD Inpatient   05/10/2016 16:38 05/12/2016 16:59 Full Code [id number]  Theodoro Grist, MD ED   04/01/2016 17:13 04/03/2016 05:58 Full Code [id number]  Henreitta Leber, MD Inpatient      TOTAL TIME TAKING CARE OF THIS PATIENT: 38 minutes.    Gladstone Lighter M.D on 02/04/2018 at 9:25 AM  Between 7am to 6pm - Pager - [phone]  After 6pm go to [email or web address] - Proofreader  [facility]  Office  [phone]  CC: Primary care physician; Dion Body, MD   Note: This dictation was prepared with Dragon dictation along with smaller phrase technology. Any transcriptional errors that result from this process are unintentional.

## 2018-02-04 NOTE — Clinical Social Work Note (Signed)
Patient to be d/c'ed today to [facility] room 803.  Patient and family agreeable to plans will transport via ems RN to call report to [phone].  Evette Cristal, MSW, [place]

## 2018-02-04 NOTE — Progress Notes (Addendum)
ANTICOAGULATION CONSULT NOTE -follow up  [place] for enoxaparin and warfarin Indication: history of mechanical heart valve - bridge to therapeutic INR  Allergies  Allergen Reactions  . Flecainide Shortness Of Breath and Other (See Comments)    Reaction: dizziness   . Amiodarone Other (See Comments)    Pt states that this medication causes lung bleeding.      Patient Measurements: Height: 5\' 9"  (175.3 cm) Weight: 204 lb 6.4 oz (92.7 kg) IBW/kg (Calculated) : 66.2 Heparin Dosing Weight:   Vital Signs: Temp: 98.7 F (37.1 C) (02/13 0502) Temp Source: Oral (02/13 0502) BP: 128/60 (02/13 0502) Pulse Rate: 97 (02/13 0502)  Labs: Recent Labs    02/02/18 0527 02/03/18 0613 02/04/18 0530  HGB  --  10.0*  --   HCT  --  30.7*  --   PLT  --  276  --   LABPROT 14.6 16.4* 17.3*  INR 1.15 1.33 1.43  CREATININE  --  0.71 0.93    Estimated Creatinine Clearance: 67.3 mL/min (by C-G formula based on SCr of 0.93 mg/dL).   Medical History: Past Medical History:  Diagnosis Date  . Acute diastolic heart failure ([place])   . Allergy   . 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 Asthma   . Atrial fibrillation ([place])   . Backache, unspecified   . Cardiomegaly   . COPD (chronic obstructive pulmonary disease) ([place])   . Diabetes mellitus without complication ([place])   . 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 Esophageal reflux   . Headache(784.0)   . Herpes zoster without mention of complication   . Hx: UTI (urinary tract infection)   . Hypertension    heart controlled w CHF  . Nontoxic uninodular goiter   . Obesity, unspecified   . Osteoarthrosis, unspecified whether generalized or localized, unspecified site   . Unspecified sleep apnea   . Urine incontinence    hx of    Medications:  Infusions:    Assessment: 71 yof cc hematuria after botox bladder injection for OAB. Warfarin PTA for AF mechanical valve was reversed with oral vitamin  K. Now pharmacy consulted to dose LMWH to bridge to therapeutic INR post cystoscopy.  Home dose: 6mg  daily. Appears pt has bubble pack done by [facility]  2/5   INR           hold 2/6   INR  1.82  Hold 2/7   INR 1.25   Warfarin 6 mg 2/8   INR 1.22   Warfarin 6 mg 2/9   INR 1.30  On HOLD 2/10 INR 1.26  Warfarin 6mg  2/11 INR 1.15  Warfarin 6mg  2/12 INR 1.33  Warfarin 6mg  2/13 INR 1.43   Goal of Therapy:  INR 2.5 to 3.5 due to mechanical mitral valve Monitor platelets by anticoagulation protocol: Yes   Plan:  INR continues to increase slowly. Still subtherapeutic. I have been hesitant to boost due to recent hematuria. Pt is however quite subtherapeutic after >48 hours. I will give 8mg  tonight and then resume 6mg  daily. Check INR in the AM. Continue to bridge with lovenox 1mg /kg BID  Benicia Bergevin D Maki Hege, Pharm.D., BCPS Clinical Pharmacist 02/04/2018,7:25 AM

## 2018-02-04 NOTE — Discharge Instructions (Signed)
Information on my medicine - Coumadin   (Warfarin)  This medication education was reviewed with me or my healthcare representative as part of my discharge preparation.  The pharmacist that spoke with me during my hospital stay was:  Ramond Dial, [facility]  Why was Coumadin prescribed for you? Coumadin was prescribed for you because you have a blood clot or a medical condition that can cause an increased risk of forming blood clots. Blood clots can cause serious health problems by blocking the flow of blood to the heart, lung, or brain. Coumadin can prevent harmful blood clots from forming. As a reminder your indication for Coumadin is:   Blood Clot Prevention After Heart Valve Surgery  What test will check on my response to Coumadin? While on Coumadin (warfarin) you will need to have an INR test regularly to ensure that your dose is keeping you in the desired range. The INR (international normalized ratio) number is calculated from the result of the laboratory test called prothrombin time (PT).  If an INR APPOINTMENT HAS NOT ALREADY BEEN MADE FOR YOU please schedule an appointment to have this lab work done by your health care provider within 7 days. Your INR goal is usually a number between:  2 to 3 or your provider may give you a more narrow range like 2-2.5.  Ask your health care provider during an office visit what your goal INR is.  What  do you need to  know  About  COUMADIN? Take Coumadin (warfarin) exactly as prescribed by your healthcare provider about the same time each day.  DO NOT stop taking without talking to the doctor who prescribed the medication.  Stopping without other blood clot prevention medication to take the place of Coumadin may increase your risk of developing a new clot or stroke.  Get refills before you run out.  What do you do if you miss a dose? If you miss a dose, take it as soon as you remember on the same day then continue your regularly scheduled regimen the next  day.  Do not take two doses of Coumadin at the same time.  Important Safety Information A possible side effect of Coumadin (Warfarin) is an increased risk of bleeding. You should call your healthcare provider right away if you experience any of the following: ? Bleeding from an injury or your nose that does not stop. ? Unusual colored urine (red or dark brown) or unusual colored stools (red or black). ? Unusual bruising for unknown reasons. ? A serious fall or if you hit your head (even if there is no bleeding).  Some foods or medicines interact with Coumadin (warfarin) and might alter your response to warfarin. To help avoid this: ? Eat a balanced diet, maintaining a consistent amount of Vitamin K. ? Notify your provider about major diet changes you plan to make. ? Avoid alcohol or limit your intake to 1 drink for women and 2 drinks for men per day. (1 drink is 5 oz. wine, 12 oz. beer, or 1.5 oz. liquor.)  Make sure that ANY health care provider who prescribes medication for you knows that you are taking Coumadin (warfarin).  Also make sure the healthcare provider who is monitoring your Coumadin knows when you have started a new medication including herbals and non-prescription products.  Coumadin (Warfarin)  Major Drug Interactions  Increased Warfarin Effect Decreased Warfarin Effect  Alcohol (large quantities) Antibiotics (esp. Septra/Bactrim, Flagyl, Cipro) Amiodarone (Cordarone) Aspirin (ASA) Cimetidine (Tagamet) Megestrol (Megace) NSAIDs (  ibuprofen, naproxen, etc.) °Piroxicam (Feldene) °Propafenone (Rythmol SR) °Propranolol (Inderal) °Isoniazid (INH) °Posaconazole (Noxafil) Barbiturates (Phenobarbital) °Carbamazepine (Tegretol) °Chlordiazepoxide (Librium) °Cholestyramine (Questran) °Griseofulvin °Oral Contraceptives °Rifampin °Sucralfate (Carafate) °Vitamin K  ° °Coumadin® (Warfarin) Major Herbal Interactions  °Increased Warfarin Effect Decreased Warfarin Effect   °Garlic °Ginseng °Ginkgo biloba Coenzyme Q10 °Green tea °St. John’s wort   ° °Coumadin® (Warfarin) FOOD Interactions  °Eat a consistent number of servings per week of foods HIGH in Vitamin K °(1 serving = ½ cup)  °Collards (cooked, or boiled & drained) °Kale (cooked, or boiled & drained) °Mustard greens (cooked, or boiled & drained) °Parsley *serving size only = ¼ cup °Spinach (cooked, or boiled & drained) °Swiss chard (cooked, or boiled & drained) °Turnip greens (cooked, or boiled & drained)  °Eat a consistent number of servings per week of foods MEDIUM-HIGH in Vitamin K °(1 serving = 1 cup)  °Asparagus (cooked, or boiled & drained) °Broccoli (cooked, boiled & drained, or raw & chopped) °Brussel sprouts (cooked, or boiled & drained) *serving size only = ½ cup °Lettuce, raw (green leaf, endive, romaine) °Spinach, raw °Turnip greens, raw & chopped  ° °These websites have more information on Coumadin (warfarin):  www.coumadin.com; °www.ahrq.gov/consumer/coumadin.htm; ° ° ° °

## 2018-02-10 ENCOUNTER — Ambulatory Visit (INDEPENDENT_AMBULATORY_CARE_PROVIDER_SITE_OTHER): Payer: Medicare Other

## 2018-02-10 VITALS — BP 106/66 | HR 85 | Ht 69.0 in | Wt 212.0 lb

## 2018-02-10 DIAGNOSIS — R339 Retention of urine, unspecified: Secondary | ICD-10-CM

## 2018-02-10 LAB — BLADDER SCAN AMB NON-IMAGING: Scan Result: 20

## 2018-02-10 NOTE — Progress Notes (Signed)
Fill and Pull Catheter Removal  Patient is present today for a catheter removal.  Patient was cleaned and prepped in a sterile fashion 152ml of sterile water/ saline was instilled into the bladder when the patient felt the urge to urinate. 21ml of water was then drained from the balloon.  A 16FR foley cath was removed from the bladder no complications were noted .  Patient as then given some time to void on their own.  Patient can void on their own after some time.  Patient tolerated well.  Preformed by: Toniann Fail, LPN   Follow up/ Additional notes: Pt was not able to make it to the bedside chair before urinating, therefore pt urinated on chux pad. PVR: 20. Reinforced with pt if not able to urinate once she returns to [place] to RTC by 3pm. Pt voiced understanding.   Blood pressure 106/66, pulse 85, height 5\' 9"  (1.753 m), weight 212 lb (96.2 kg).

## 2018-02-16 ENCOUNTER — Other Ambulatory Visit: Payer: Self-pay

## 2018-02-16 ENCOUNTER — Ambulatory Visit (INDEPENDENT_AMBULATORY_CARE_PROVIDER_SITE_OTHER): Payer: Medicare Other | Admitting: Urology

## 2018-02-16 ENCOUNTER — Encounter: Payer: Self-pay | Admitting: Urology

## 2018-02-16 VITALS — BP 98/61 | HR 71 | Temp 97.7°F | Resp 16

## 2018-02-16 DIAGNOSIS — N3946 Mixed incontinence: Secondary | ICD-10-CM | POA: Diagnosis not present

## 2018-02-16 DIAGNOSIS — R32 Unspecified urinary incontinence: Secondary | ICD-10-CM

## 2018-02-16 LAB — BLADDER SCAN AMB NON-IMAGING: SCAN RESULT: 18

## 2018-02-16 NOTE — Progress Notes (Addendum)
02/16/2018 10:17 AM   Betty [name] 1946-05-25 [id number]  Referring provider: Dion Body, MD [place] [facility] [place], [place] [address]  Chief Complaint  Patient presents with  . Follow-up    want to make sure she has no problems     HPI: Betty [name]:The patient had mixed incontinence. She had InterStim placed in 2012 but the permanent lead apparently did not work well. The test stimulation apparently had worked.Residual was 0 mL and she was on oxybutynin.The impedance check was good and her battery life was low and she was given Toviaz.She does have sleep apnea with nighttime frequency and uses CPAP.  Today The patient leaks with coughing and sneezing and sometimes bending and lifting. Her primary symptom is urgency incontinence and severe bedwetting. She can use 8-10 pads a day that are quite wet. She soaks at night. She has nocturia x2.   The patient has mixed incontinence. Based upon the clinical presentation 80% of the problem is an overactive bladder with urgency incontinence and bedwetting. She has very mild stress incontinence. She had a positive urine culture in April. Recognizing that it may not reach her treatment goal as a refractory OAB patient I sent the urine for culture. I placed her on trimethoprim suppression therapy. On the next visit if she is still leaking a lot I will likely offer her antimuscarinics as above that she may not have had. I will talk to her about botulinum toxin and percutaneous tibial nerve stimulation. Because of her InterStim I do not think she is a good candidate for studies.  The patient has failed Vesicare. She is currently on oxybutynin trimethoprim. Insurance would not cover Toviaz. Her last culture was positive and it was also positive in April.   The patient had Botox February 4.  She actually ended up being admitted to the hospital for a bleeder.  She was in for several days.  Dr. Erlene Quan  Me updated throughout the treatment course and follow-up treatment.  Today The patient had been on blood thinner.  She has a mechanical heart valve.  Urine culture in the hospital was normal and she did not have an upper tract x-ray  The patient is now in rehab and is getting stronger.  She now uses 2 pads a day and if she does not hold it too long they are not very wet.  She is dramatically better in regards to her continence.  She still feels like she needs to go after she urinates but her bladder scan residual today was 15 mL  Frequency stable   PMH: Past Medical History:  Diagnosis Date  . Acute diastolic heart failure ([place])   . Allergy   . 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 Asthma   . Atrial fibrillation ([place])   . Backache, unspecified   . Cardiomegaly   . COPD (chronic obstructive pulmonary disease) ([place])   . Diabetes mellitus without complication ([place])   . 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 Esophageal reflux   . Headache(784.0)   . Herpes zoster without mention of complication   . Hx: UTI (urinary tract infection)   . Hypertension    heart controlled w CHF  . Nontoxic uninodular goiter   . Obesity, unspecified   . Osteoarthrosis, unspecified whether generalized or localized, unspecified site   . Unspecified sleep apnea   . Urine incontinence    hx of    Surgical History: Past Surgical History:  Procedure Laterality Date  . ABDOMINAL HYSTERECTOMY  1979   complete (for precancerous cells)  . ABLATION  2011 & 2014  . bladder botox  01/26/2018  . CHOLECYSTECTOMY    . CYSTOSCOPY WITH FULGERATION N/A 01/28/2018   Procedure: [place] AND CLOT EVACUATION;  Surgeon: Hollice Espy, MD;  Location: [facility] ORS;  Service: Urology;  Laterality: N/A;  . Interstim Placement  2012  . OOPHORECTOMY      Home Medications:  Allergies as of 02/16/2018      Reactions   Flecainide Shortness Of Breath, Other (See Comments)    Reaction: dizziness    Amiodarone Other (See Comments)   Pt states that this medication causes lung bleeding.        Medication List        Accurate as of 02/16/18 10:17 AM. Always use your most recent med list.          acetaminophen 325 MG tablet Commonly known as:  TYLENOL Take 2 tablets (650 mg total) by mouth every 6 (six) hours as needed for mild pain (or Fever >/= 101).   albuterol 108 (90 Base) MCG/ACT inhaler Commonly known as:  PROVENTIL HFA;VENTOLIN HFA Inhale 2 puffs into the lungs every 4 (four) hours as needed for wheezing or shortness of breath.   albuterol (2.5 MG/3ML) 0.083% nebulizer solution Commonly known as:  PROVENTIL Take 3 mLs (2.5 mg total) by nebulization every 4 (four) hours as needed for wheezing or shortness of breath.   atorvastatin 20 MG tablet Commonly known as:  LIPITOR Take 20 mg by mouth daily.   azaTHIOprine 50 MG tablet Commonly known as:  IMURAN Take 100 mg by mouth daily.   calcium-vitamin D 500-200 MG-UNIT tablet Commonly known as:  OSCAL WITH D Take 1 tablet by mouth daily.   diltiazem 180 MG 24 hr capsule Commonly known as:  CARDIZEM CD Take 180 mg by mouth daily.   enoxaparin 100 MG/ML injection Commonly known as:  LOVENOX Inject 0.95 mLs (95 mg total) into the skin every 12 (twelve) hours. X 3-4 days until INR reaches goal of 2.5   ferrous sulfate 325 (65 FE) MG EC tablet Take 325 mg by mouth daily.   fluticasone 50 MCG/ACT nasal spray Commonly known as:  FLONASE Place 2 sprays into the nose daily.   gabapentin 600 MG tablet Commonly known as:  NEURONTIN Take 600 mg by mouth 2 (two) times daily.   guaiFENesin-dextromethorphan 100-10 MG/5ML syrup Commonly known as:  ROBITUSSIN DM Take 5 mLs by mouth every 4 (four) hours as needed for cough.   Lancets 30G Misc Use 1 Units as directed. Check CBG's fasting once daily. Dx: E11.9   loratadine 10 MG tablet Commonly known as:  CLARITIN Take 10 mg by mouth daily.     magnesium oxide 400 (241.3 Mg) MG tablet Commonly known as:  MAG-OX TAKE 1 TABLET (400 MG TOTAL) BY MOUTH ONCE DAILY.   metFORMIN 1000 MG tablet Commonly known as:  GLUCOPHAGE Take 1,000 mg by mouth 2 (two) times daily with a meal.   metolazone 2.5 MG tablet Commonly known as:  ZAROXOLYN Take 2.5 mg by mouth 2 (two) times a week. Take 2.5 mg by mouth on Monday and Thursday.   metoprolol succinate 25 MG 24 hr tablet Commonly known as:  TOPROL-XL Take 25 mg by mouth daily.   opium-belladonna 16.2-60 MG suppository Commonly known as:  B&O SUPPRETTES Place 1 suppository rectally every 6 (six) hours as needed for bladder  spasms.   oxybutynin 5 MG tablet Commonly known as:  DITROPAN Take 5 mg by mouth 3 (three) times daily.   pantoprazole 40 MG tablet Commonly known as:  PROTONIX Take 40 mg by mouth daily.   potassium chloride SA 20 MEQ tablet Commonly known as:  K-DUR,KLOR-CON Take 1 tablet (20 mEq total) by mouth 2 (two) times daily.   propafenone 225 MG tablet Commonly known as:  RYTHMOL Take 225 mg by mouth every 12 (twelve) hours.   rOPINIRole 4 MG tablet Commonly known as:  REQUIP Take 4 mg by mouth at bedtime.   SENNA-PLUS 8.6-50 MG tablet Generic drug:  senna-docusate Take 2 tablets by mouth 2 (two) times daily.   torsemide 20 MG tablet Commonly known as:  DEMADEX Take 80 mg by mouth 2 (two) times daily.   traZODone 150 MG tablet Commonly known as:  DESYREL Take 1 tablet (150 mg total) by mouth at bedtime as needed for sleep.   venlafaxine 75 MG tablet Commonly known as:  EFFEXOR Take 75 mg by mouth 3 (three) times daily.   Vitamin D (Ergocalciferol) 50000 units Caps capsule Commonly known as:  DRISDOL Take 50,000 Units by mouth every 7 (seven) days. Mondays only   warfarin 6 MG tablet Commonly known as:  COUMADIN Take 6 mg by mouth daily at 6 PM.       Allergies:  Allergies  Allergen Reactions  . Flecainide Shortness Of Breath and Other (See  Comments)    Reaction: dizziness   . Amiodarone Other (See Comments)    Pt states that this medication causes lung bleeding.      Family History: Family History  Problem Relation Age of Onset  . Heart attack Father   . Heart failure Father   . Arthritis Father   . Stroke Father   . Hypertension Father   . Coronary artery disease Brother   . Peripheral vascular disease Brother   . Arthritis Mother   . Colon cancer Mother        colon cancer  . Hypertension Mother   . Cancer Maternal Grandmother        colon cancer  . Arthritis Maternal Grandmother     Social History:  reports that she has quit smoking. Her smoking use included cigarettes. She has a 7.50 pack-year smoking history. she has never used smokeless tobacco. She reports that she does not drink alcohol or use drugs.  ROS: UROLOGY Frequent Urination?: Yes Hard to postpone urination?: No Burning/pain with urination?: No Get up at night to urinate?: Yes Leakage of urine?: Yes Urine stream starts and stops?: No Trouble starting stream?: Yes Do you have to strain to urinate?: No Blood in urine?: No Urinary tract infection?: No Sexually transmitted disease?: No Injury to kidneys or bladder?: No Painful intercourse?: No Weak stream?: No Currently pregnant?: No Vaginal bleeding?: No Last menstrual period?: n  Gastrointestinal Nausea?: No Vomiting?: No Indigestion/heartburn?: No Diarrhea?: No Constipation?: No  Constitutional Fever: No Night sweats?: No Weight loss?: No Fatigue?: No  Skin Skin rash/lesions?: No Itching?: No  Eyes Blurred vision?: No Double vision?: No  Ears/Nose/Throat Sore throat?: No Sinus problems?: No  Hematologic/Lymphatic Swollen glands?: No Easy bruising?: No  Cardiovascular Leg swelling?: No Chest pain?: No  Respiratory Cough?: No Shortness of breath?: No  Endocrine Excessive thirst?: No  Musculoskeletal Back pain?: No Joint pain?:  No  Neurological Headaches?: No Dizziness?: No  Psychologic Depression?: No Anxiety?: No  Physical Exam: BP 98/61   Pulse  71   Temp 97.7 F (36.5 C) (Oral)   Resp 16   SpO2 (!) 88%   Constitutional:  Alert and oriented, No acute distress.   Laboratory Data: Lab Results  Component Value Date   WBC 11.2 (H) 02/03/2018   HGB 10.0 (L) 02/03/2018   HCT 30.7 (L) 02/03/2018   MCV 87.9 02/03/2018   PLT 276 02/03/2018    Lab Results  Component Value Date   CREATININE 0.93 02/04/2018    No results found for: PSA  No results found for: TESTOSTERONE  Lab Results  Component Value Date   HGBA1C 7.3 (H) 07/19/2017    Urinalysis    Component Value Date/Time   COLORURINE AMBER (A) 01/30/2018 1117   APPEARANCEUR CLOUDY (A) 01/30/2018 1117   APPEARANCEUR Clear 01/26/2018 0845   LABSPEC 1.017 01/30/2018 1117   PHURINE 5.0 01/30/2018 1117   GLUCOSEU NEGATIVE 01/30/2018 1117   GLUCOSEU NEGATIVE 05/03/2014 0931   HGBUR LARGE (A) 01/30/2018 1117   BILIRUBINUR NEGATIVE 01/30/2018 1117   BILIRUBINUR Negative 01/26/2018 0845   KETONESUR NEGATIVE 01/30/2018 1117   PROTEINUR 100 (A) 01/30/2018 1117   UROBILINOGEN 1.0 01/20/2015 1500   UROBILINOGEN 0.2 05/03/2014 0931   NITRITE NEGATIVE 01/30/2018 1117   LEUKOCYTESUR MODERATE (A) 01/30/2018 1117   LEUKOCYTESUR Trace (A) 01/26/2018 0845    Pertinent Imaging: none  Assessment & Plan: I will had the rehab center get a urine culture since we did not get a urine today.  I will see her back in about 1 month to check on her.  Unfortunately her treatment options are limited.  In my opinion a repeat Botox would not be ideal.  If she did have a repeat treatment it would have to be done in my opinion under anesthesia with fulguration if needed.  Having said that persons can have delayed bleeding.  It turns out the patient on February 21 had a positive culture.  It was faxed to [place].  We will check to see if it was treated.  If not we will  call in Macrodantin 100 mg twice a day for 1 week and we will scan the report  There are no diagnoses linked to this encounter.  No Follow-up on file.  Reece Packer, MD  [facility] [address] [place], [place] [address] [phone]

## 2018-02-17 ENCOUNTER — Other Ambulatory Visit: Payer: Self-pay

## 2018-02-19 ENCOUNTER — Inpatient Hospital Stay: Payer: Medicare Other

## 2018-02-19 ENCOUNTER — Emergency Department: Payer: Medicare Other

## 2018-02-19 ENCOUNTER — Other Ambulatory Visit: Payer: Self-pay

## 2018-02-19 ENCOUNTER — Inpatient Hospital Stay
Admission: EM | Admit: 2018-02-19 | Discharge: 2018-03-01 | DRG: 551 | Disposition: A | Discharge status: Other Acute Inpatient Hospital | Payer: Medicare Other | Attending: Internal Medicine | Admitting: Internal Medicine

## 2018-02-19 DIAGNOSIS — E860 Dehydration: Secondary | ICD-10-CM | POA: Diagnosis present

## 2018-02-19 DIAGNOSIS — T380X5A Adverse effect of glucocorticoids and synthetic analogues, initial encounter: Secondary | ICD-10-CM | POA: Diagnosis not present

## 2018-02-19 DIAGNOSIS — Z6832 Body mass index (BMI) 32.0-32.9, adult: Secondary | ICD-10-CM

## 2018-02-19 DIAGNOSIS — N189 Chronic kidney disease, unspecified: Secondary | ICD-10-CM | POA: Diagnosis present

## 2018-02-19 DIAGNOSIS — G9340 Encephalopathy, unspecified: Secondary | ICD-10-CM | POA: Diagnosis not present

## 2018-02-19 DIAGNOSIS — J9601 Acute respiratory failure with hypoxia: Secondary | ICD-10-CM | POA: Diagnosis not present

## 2018-02-19 DIAGNOSIS — I130 Hypertensive heart and chronic kidney disease with heart failure and stage 1 through stage 4 chronic kidney disease, or unspecified chronic kidney disease: Secondary | ICD-10-CM | POA: Diagnosis present

## 2018-02-19 DIAGNOSIS — R791 Abnormal coagulation profile: Secondary | ICD-10-CM

## 2018-02-19 DIAGNOSIS — Y92239 Unspecified place in hospital as the place of occurrence of the external cause: Secondary | ICD-10-CM | POA: Diagnosis not present

## 2018-02-19 DIAGNOSIS — E878 Other disorders of electrolyte and fluid balance, not elsewhere classified: Secondary | ICD-10-CM | POA: Diagnosis present

## 2018-02-19 DIAGNOSIS — Z09 Encounter for follow-up examination after completed treatment for conditions other than malignant neoplasm: Secondary | ICD-10-CM

## 2018-02-19 DIAGNOSIS — E785 Hyperlipidemia, unspecified: Secondary | ICD-10-CM | POA: Diagnosis present

## 2018-02-19 DIAGNOSIS — E669 Obesity, unspecified: Secondary | ICD-10-CM | POA: Diagnosis present

## 2018-02-19 DIAGNOSIS — N390 Urinary tract infection, site not specified: Secondary | ICD-10-CM | POA: Diagnosis present

## 2018-02-19 DIAGNOSIS — Y92129 Unspecified place in nursing home as the place of occurrence of the external cause: Secondary | ICD-10-CM

## 2018-02-19 DIAGNOSIS — J449 Chronic obstructive pulmonary disease, unspecified: Secondary | ICD-10-CM

## 2018-02-19 DIAGNOSIS — Z87891 Personal history of nicotine dependence: Secondary | ICD-10-CM

## 2018-02-19 DIAGNOSIS — Z4659 Encounter for fitting and adjustment of other gastrointestinal appliance and device: Secondary | ICD-10-CM

## 2018-02-19 DIAGNOSIS — R040 Epistaxis: Secondary | ICD-10-CM | POA: Diagnosis not present

## 2018-02-19 DIAGNOSIS — Z9981 Dependence on supplemental oxygen: Secondary | ICD-10-CM

## 2018-02-19 DIAGNOSIS — S12130A Unspecified traumatic displaced spondylolisthesis of second cervical vertebra, initial encounter for closed fracture: Secondary | ICD-10-CM

## 2018-02-19 DIAGNOSIS — I959 Hypotension, unspecified: Secondary | ICD-10-CM | POA: Diagnosis not present

## 2018-02-19 DIAGNOSIS — J441 Chronic obstructive pulmonary disease with (acute) exacerbation: Secondary | ICD-10-CM

## 2018-02-19 DIAGNOSIS — J9690 Respiratory failure, unspecified, unspecified whether with hypoxia or hypercapnia: Secondary | ICD-10-CM

## 2018-02-19 DIAGNOSIS — J9811 Atelectasis: Secondary | ICD-10-CM | POA: Diagnosis present

## 2018-02-19 DIAGNOSIS — J9622 Acute and chronic respiratory failure with hypercapnia: Secondary | ICD-10-CM | POA: Diagnosis not present

## 2018-02-19 DIAGNOSIS — I5033 Acute on chronic diastolic (congestive) heart failure: Secondary | ICD-10-CM | POA: Diagnosis present

## 2018-02-19 DIAGNOSIS — E1122 Type 2 diabetes mellitus with diabetic chronic kidney disease: Secondary | ICD-10-CM | POA: Diagnosis present

## 2018-02-19 DIAGNOSIS — J9600 Acute respiratory failure, unspecified whether with hypoxia or hypercapnia: Secondary | ICD-10-CM

## 2018-02-19 DIAGNOSIS — J189 Pneumonia, unspecified organism: Secondary | ICD-10-CM | POA: Diagnosis present

## 2018-02-19 DIAGNOSIS — I4891 Unspecified atrial fibrillation: Secondary | ICD-10-CM

## 2018-02-19 DIAGNOSIS — E870 Hyperosmolality and hypernatremia: Secondary | ICD-10-CM | POA: Diagnosis present

## 2018-02-19 DIAGNOSIS — M549 Dorsalgia, unspecified: Secondary | ICD-10-CM | POA: Diagnosis present

## 2018-02-19 DIAGNOSIS — G588 Other specified mononeuropathies: Secondary | ICD-10-CM | POA: Diagnosis present

## 2018-02-19 DIAGNOSIS — J849 Interstitial pulmonary disease, unspecified: Secondary | ICD-10-CM | POA: Diagnosis present

## 2018-02-19 DIAGNOSIS — J8410 Pulmonary fibrosis, unspecified: Secondary | ICD-10-CM | POA: Diagnosis present

## 2018-02-19 DIAGNOSIS — G92 Toxic encephalopathy: Secondary | ICD-10-CM | POA: Diagnosis present

## 2018-02-19 DIAGNOSIS — M4802 Spinal stenosis, cervical region: Secondary | ICD-10-CM | POA: Diagnosis present

## 2018-02-19 DIAGNOSIS — R451 Restlessness and agitation: Secondary | ICD-10-CM | POA: Diagnosis not present

## 2018-02-19 DIAGNOSIS — E871 Hypo-osmolality and hyponatremia: Secondary | ICD-10-CM

## 2018-02-19 DIAGNOSIS — K219 Gastro-esophageal reflux disease without esophagitis: Secondary | ICD-10-CM | POA: Diagnosis present

## 2018-02-19 DIAGNOSIS — E875 Hyperkalemia: Secondary | ICD-10-CM | POA: Diagnosis not present

## 2018-02-19 DIAGNOSIS — I480 Paroxysmal atrial fibrillation: Secondary | ICD-10-CM | POA: Diagnosis present

## 2018-02-19 DIAGNOSIS — M1990 Unspecified osteoarthritis, unspecified site: Secondary | ICD-10-CM | POA: Diagnosis present

## 2018-02-19 DIAGNOSIS — R410 Disorientation, unspecified: Secondary | ICD-10-CM | POA: Diagnosis not present

## 2018-02-19 DIAGNOSIS — R7989 Other specified abnormal findings of blood chemistry: Secondary | ICD-10-CM | POA: Diagnosis not present

## 2018-02-19 DIAGNOSIS — N179 Acute kidney failure, unspecified: Secondary | ICD-10-CM | POA: Diagnosis present

## 2018-02-19 DIAGNOSIS — R402413 Glasgow coma scale score 13-15, at hospital admission: Secondary | ICD-10-CM | POA: Diagnosis present

## 2018-02-19 DIAGNOSIS — Z888 Allergy status to other drugs, medicaments and biological substances status: Secondary | ICD-10-CM

## 2018-02-19 DIAGNOSIS — S0003XA Contusion of scalp, initial encounter: Secondary | ICD-10-CM | POA: Diagnosis present

## 2018-02-19 DIAGNOSIS — T17998A Other foreign object in respiratory tract, part unspecified causing other injury, initial encounter: Secondary | ICD-10-CM | POA: Diagnosis present

## 2018-02-19 DIAGNOSIS — D62 Acute posthemorrhagic anemia: Secondary | ICD-10-CM | POA: Diagnosis not present

## 2018-02-19 DIAGNOSIS — E041 Nontoxic single thyroid nodule: Secondary | ICD-10-CM | POA: Diagnosis present

## 2018-02-19 DIAGNOSIS — Z79899 Other long term (current) drug therapy: Secondary | ICD-10-CM

## 2018-02-19 DIAGNOSIS — Z8709 Personal history of other diseases of the respiratory system: Secondary | ICD-10-CM

## 2018-02-19 DIAGNOSIS — R0603 Acute respiratory distress: Secondary | ICD-10-CM | POA: Diagnosis not present

## 2018-02-19 DIAGNOSIS — J9621 Acute and chronic respiratory failure with hypoxia: Secondary | ICD-10-CM | POA: Diagnosis not present

## 2018-02-19 DIAGNOSIS — S12000A Unspecified displaced fracture of first cervical vertebra, initial encounter for closed fracture: Secondary | ICD-10-CM

## 2018-02-19 DIAGNOSIS — R32 Unspecified urinary incontinence: Secondary | ICD-10-CM | POA: Diagnosis present

## 2018-02-19 DIAGNOSIS — W1830XA Fall on same level, unspecified, initial encounter: Secondary | ICD-10-CM | POA: Diagnosis present

## 2018-02-19 DIAGNOSIS — Z8619 Personal history of other infectious and parasitic diseases: Secondary | ICD-10-CM

## 2018-02-19 DIAGNOSIS — Z952 Presence of prosthetic heart valve: Secondary | ICD-10-CM

## 2018-02-19 DIAGNOSIS — F29 Unspecified psychosis not due to a substance or known physiological condition: Secondary | ICD-10-CM | POA: Diagnosis not present

## 2018-02-19 DIAGNOSIS — I776 Arteritis, unspecified: Secondary | ICD-10-CM | POA: Diagnosis present

## 2018-02-19 DIAGNOSIS — E1165 Type 2 diabetes mellitus with hyperglycemia: Secondary | ICD-10-CM | POA: Diagnosis not present

## 2018-02-19 DIAGNOSIS — G4730 Sleep apnea, unspecified: Secondary | ICD-10-CM | POA: Diagnosis present

## 2018-02-19 DIAGNOSIS — R0902 Hypoxemia: Secondary | ICD-10-CM

## 2018-02-19 DIAGNOSIS — Z1612 Extended spectrum beta lactamase (ESBL) resistance: Secondary | ICD-10-CM | POA: Diagnosis present

## 2018-02-19 DIAGNOSIS — T43215A Adverse effect of selective serotonin and norepinephrine reuptake inhibitors, initial encounter: Secondary | ICD-10-CM | POA: Diagnosis not present

## 2018-02-19 DIAGNOSIS — S12100A Unspecified displaced fracture of second cervical vertebra, initial encounter for closed fracture: Principal | ICD-10-CM | POA: Diagnosis present

## 2018-02-19 DIAGNOSIS — T402X5A Adverse effect of other opioids, initial encounter: Secondary | ICD-10-CM | POA: Diagnosis not present

## 2018-02-19 DIAGNOSIS — Z950 Presence of cardiac pacemaker: Secondary | ICD-10-CM

## 2018-02-19 DIAGNOSIS — Z7901 Long term (current) use of anticoagulants: Secondary | ICD-10-CM

## 2018-02-19 LAB — CBC
HCT: 31.4 % — ABNORMAL LOW (ref 35.0–47.0)
Hemoglobin: 9.9 g/dL — ABNORMAL LOW (ref 12.0–16.0)
MCH: 26.3 pg (ref 26.0–34.0)
MCHC: 31.6 g/dL — AB (ref 32.0–36.0)
MCV: 83 fL (ref 80.0–100.0)
PLATELETS: 472 10*3/uL — AB (ref 150–440)
RBC: 3.78 MIL/uL — ABNORMAL LOW (ref 3.80–5.20)
RDW: 20 % — ABNORMAL HIGH (ref 11.5–14.5)
WBC: 12.6 10*3/uL — ABNORMAL HIGH (ref 3.6–11.0)

## 2018-02-19 LAB — BASIC METABOLIC PANEL
Anion gap: 15 (ref 5–15)
BUN: 47 mg/dL — ABNORMAL HIGH (ref 6–20)
CALCIUM: 8.2 mg/dL — AB (ref 8.9–10.3)
CO2: 26 mmol/L (ref 22–32)
CREATININE: 1.91 mg/dL — AB (ref 0.44–1.00)
Chloride: 88 mmol/L — ABNORMAL LOW (ref 101–111)
GFR calc Af Amer: 29 mL/min — ABNORMAL LOW (ref >60)
GFR, EST NON AFRICAN AMERICAN: 25 mL/min — AB (ref >60)
GLUCOSE: 118 mg/dL — AB (ref 65–99)
Potassium: 5 mmol/L (ref 3.5–5.1)
Sodium: 129 mmol/L — ABNORMAL LOW (ref 135–145)

## 2018-02-19 LAB — URINALYSIS, COMPLETE (UACMP) WITH MICROSCOPIC
Bilirubin Urine: NEGATIVE
Glucose, UA: NEGATIVE mg/dL
Hgb urine dipstick: NEGATIVE
KETONES UR: NEGATIVE mg/dL
Leukocytes, UA: NEGATIVE
Nitrite: NEGATIVE
PROTEIN: NEGATIVE mg/dL
Specific Gravity, Urine: 1.011 (ref 1.005–1.030)
pH: 5 (ref 5.0–8.0)

## 2018-02-19 LAB — TROPONIN I

## 2018-02-19 LAB — PROTIME-INR
INR: 4.44
Prothrombin Time: 42 seconds — ABNORMAL HIGH (ref 11.4–15.2)

## 2018-02-19 LAB — GLUCOSE, CAPILLARY
Glucose-Capillary: 87 mg/dL (ref 65–99)
Glucose-Capillary: 96 mg/dL (ref 65–99)
Glucose-Capillary: 100 mg/dL — ABNORMAL HIGH (ref 65–99)

## 2018-02-19 MED ORDER — LORATADINE 10 MG PO TABS: 10.0000 mg | ORAL_TABLET | Freq: Every day | ORAL | Status: DC

## 2018-02-19 MED ORDER — WARFARIN - PHARMACIST DOSING INPATIENT
Freq: Every day | Status: DC
Start: 2018-02-19 — End: 2018-02-20

## 2018-02-19 MED ORDER — OXYBUTYNIN CHLORIDE 5 MG PO TABS
5.0000 mg | ORAL_TABLET | Freq: Three times a day (TID) | ORAL | Status: DC
  Administered 2018-02-19 (×3): 5 mg via ORAL
  Filled 2018-02-19 (×19): qty 1

## 2018-02-19 MED ORDER — DILTIAZEM HCL ER COATED BEADS 180 MG PO CP24
180.0000 mg | ORAL_CAPSULE | Freq: Every day | ORAL | Status: DC
  Administered 2018-02-19: 180 mg via ORAL
  Filled 2018-02-19 (×2): qty 1

## 2018-02-19 MED ORDER — FERROUS SULFATE 325 (65 FE) MG PO TABS
325.0000 mg | ORAL_TABLET | Freq: Every day | ORAL | Status: DC
  Filled 2018-02-19: qty 1

## 2018-02-19 MED ORDER — SODIUM CHLORIDE 0.9 % IV BOLUS (SEPSIS)
500.0000 mL | Freq: Once | INTRAVENOUS | Status: AC
  Administered 2018-02-19: 500 mL via INTRAVENOUS

## 2018-02-19 MED ORDER — SENNOSIDES-DOCUSATE SODIUM 8.6-50 MG PO TABS
2.0000 | ORAL_TABLET | Freq: Two times a day (BID) | ORAL | Status: DC
  Administered 2018-02-19 (×2): 2 via ORAL
  Filled 2018-02-19 (×3): qty 2

## 2018-02-19 MED ORDER — AZATHIOPRINE 50 MG PO TABS
100.0000 mg | ORAL_TABLET | Freq: Every day | ORAL | Status: DC
  Administered 2018-02-19: 100 mg via ORAL
  Filled 2018-02-19 (×9): qty 2

## 2018-02-19 MED ORDER — BELLADONNA ALKALOIDS-OPIUM 16.2-60 MG RE SUPP: 1.0000 | Freq: Four times a day (QID) | RECTAL | Status: DC | PRN

## 2018-02-19 MED ORDER — FERROUS SULFATE 325 (65 FE) MG PO TBEC: 325.0000 mg | DELAYED_RELEASE_TABLET | Freq: Every day | ORAL | Status: DC

## 2018-02-19 MED ORDER — MORPHINE SULFATE (PF) 2 MG/ML IV SOLN
2.0000 mg | Freq: Once | INTRAVENOUS | Status: AC
  Administered 2018-02-19: 2 mg via INTRAVENOUS

## 2018-02-19 MED ORDER — POLYETHYLENE GLYCOL 3350 17 G PO PACK: 17.0000 g | PACK | Freq: Every day | ORAL | Status: DC | PRN

## 2018-02-19 MED ORDER — PANTOPRAZOLE SODIUM 40 MG PO TBEC
40.0000 mg | DELAYED_RELEASE_TABLET | Freq: Every day | ORAL | Status: DC
  Administered 2018-02-19: 40 mg via ORAL
  Filled 2018-02-19: qty 1

## 2018-02-19 MED ORDER — ONDANSETRON HCL 4 MG PO TABS
4.0000 mg | ORAL_TABLET | Freq: Four times a day (QID) | ORAL | Status: DC | PRN
Start: 2018-02-19 — End: 2018-02-21

## 2018-02-19 MED ORDER — ACETAMINOPHEN 325 MG PO TABS: 650.0000 mg | ORAL_TABLET | Freq: Four times a day (QID) | ORAL | Status: DC | PRN

## 2018-02-19 MED ORDER — INSULIN ASPART 100 UNIT/ML ~~LOC~~ SOLN: 0.0000 [IU] | Freq: Three times a day (TID) | SUBCUTANEOUS | Status: DC

## 2018-02-19 MED ORDER — ACETAMINOPHEN 650 MG RE SUPP
650.0000 mg | Freq: Four times a day (QID) | RECTAL | Status: DC | PRN
  Administered 2018-02-24 – 2018-02-28 (×3): 650 mg via RECTAL
  Filled 2018-02-19 (×3): qty 1

## 2018-02-19 MED ORDER — ROPINIROLE HCL 1 MG PO TABS
4.0000 mg | ORAL_TABLET | Freq: Every day | ORAL | Status: DC
  Administered 2018-02-19: 4 mg via ORAL
  Filled 2018-02-19 (×2): qty 4

## 2018-02-19 MED ORDER — ATORVASTATIN CALCIUM 20 MG PO TABS
20.0000 mg | ORAL_TABLET | Freq: Every day | ORAL | Status: DC
  Administered 2018-02-19: 20 mg via ORAL
  Filled 2018-02-19: qty 1

## 2018-02-19 MED ORDER — METOPROLOL SUCCINATE ER 50 MG PO TB24
25.0000 mg | ORAL_TABLET | Freq: Two times a day (BID) | ORAL | Status: DC
  Administered 2018-02-19 (×2): 25 mg via ORAL
  Filled 2018-02-19 (×2): qty 1

## 2018-02-19 MED ORDER — VENLAFAXINE HCL 37.5 MG PO TABS
75.0000 mg | ORAL_TABLET | Freq: Three times a day (TID) | ORAL | Status: DC
  Administered 2018-02-19 (×2): 75 mg via ORAL
  Filled 2018-02-19 (×2): qty 2
  Filled 2018-02-19 (×2): qty 1

## 2018-02-19 MED ORDER — PROPAFENONE HCL 225 MG PO TABS
225.0000 mg | ORAL_TABLET | Freq: Two times a day (BID) | ORAL | Status: DC
  Administered 2018-02-19 (×2): 225 mg via ORAL
  Filled 2018-02-19 (×13): qty 1

## 2018-02-19 MED ORDER — ALBUTEROL SULFATE (2.5 MG/3ML) 0.083% IN NEBU: 2.5000 mg | INHALATION_SOLUTION | RESPIRATORY_TRACT | Status: DC | PRN

## 2018-02-19 MED ORDER — TRAZODONE HCL 50 MG PO TABS
150.0000 mg | ORAL_TABLET | Freq: Every evening | ORAL | Status: DC | PRN
  Administered 2018-02-19: 150 mg via ORAL
  Filled 2018-02-19: qty 1

## 2018-02-19 MED ORDER — ONDANSETRON HCL 4 MG/2ML IJ SOLN: 4.0000 mg | Freq: Four times a day (QID) | INTRAMUSCULAR | Status: DC | PRN

## 2018-02-19 MED ORDER — ONDANSETRON HCL 4 MG/2ML IJ SOLN
INTRAMUSCULAR | Status: AC
  Administered 2018-02-19: 4 mg via INTRAVENOUS
  Filled 2018-02-19: qty 2

## 2018-02-19 MED ORDER — SODIUM CHLORIDE 0.9 % IV SOLN
INTRAVENOUS | Status: DC
  Administered 2018-02-19: 12:00:00 via INTRAVENOUS

## 2018-02-19 MED ORDER — MORPHINE SULFATE (PF) 2 MG/ML IV SOLN
INTRAVENOUS | Status: AC
  Administered 2018-02-19: 2 mg via INTRAVENOUS
  Filled 2018-02-19: qty 1

## 2018-02-19 MED ORDER — MAGIC MOUTHWASH
10.0000 mL | Freq: Four times a day (QID) | ORAL | Status: DC | PRN
  Filled 2018-02-19: qty 10

## 2018-02-19 MED ORDER — TRAMADOL HCL 50 MG PO TABS
50.0000 mg | ORAL_TABLET | Freq: Four times a day (QID) | ORAL | Status: DC | PRN
  Administered 2018-02-19 – 2018-02-21 (×4): 50 mg via ORAL
  Filled 2018-02-19 (×4): qty 1

## 2018-02-19 MED ORDER — ALBUTEROL SULFATE HFA 108 (90 BASE) MCG/ACT IN AERS: 2.0000 | INHALATION_SPRAY | RESPIRATORY_TRACT | Status: DC | PRN

## 2018-02-19 MED ORDER — ONDANSETRON HCL 4 MG/2ML IJ SOLN
4.0000 mg | Freq: Once | INTRAMUSCULAR | Status: AC
  Administered 2018-02-19: 4 mg via INTRAVENOUS

## 2018-02-19 MED ORDER — CALCIUM CARBONATE-VITAMIN D 500-200 MG-UNIT PO TABS
1.0000 | ORAL_TABLET | Freq: Every day | ORAL | Status: DC
  Administered 2018-02-19: 1 via ORAL
  Filled 2018-02-19 (×2): qty 1

## 2018-02-19 MED ORDER — GABAPENTIN 600 MG PO TABS
600.0000 mg | ORAL_TABLET | Freq: Two times a day (BID) | ORAL | Status: DC
  Administered 2018-02-19 (×2): 600 mg via ORAL
  Filled 2018-02-19 (×3): qty 1

## 2018-02-19 MED ORDER — MORPHINE SULFATE (PF) 2 MG/ML IV SOLN
2.0000 mg | Freq: Once | INTRAVENOUS | Status: AC
  Administered 2018-02-19: 2 mg via INTRAVENOUS
  Filled 2018-02-19: qty 1

## 2018-02-19 MED ORDER — FENTANYL CITRATE (PF) 100 MCG/2ML IJ SOLN
INTRAMUSCULAR | Status: AC
  Administered 2018-02-19: 25 ug via INTRAVENOUS
  Filled 2018-02-19: qty 2

## 2018-02-19 MED ORDER — BISACODYL 5 MG PO TBEC: 5.0000 mg | DELAYED_RELEASE_TABLET | Freq: Every day | ORAL | Status: DC | PRN

## 2018-02-19 MED ORDER — FENTANYL CITRATE (PF) 100 MCG/2ML IJ SOLN
25.0000 ug | Freq: Once | INTRAMUSCULAR | Status: AC
  Administered 2018-02-19: 25 ug via INTRAVENOUS

## 2018-02-19 NOTE — Consult Note (Signed)
Referring Physician:  No referring provider defined for this encounter.  Primary Physician:  Dion Body, MD  Chief Complaint:  Fall, c2 fracture  History of Present Illness: 02/19/2018 Betty [name] is a 72 y.o. female who presents with the chief complaint of cervical spine fractures after a fall.  She suffered a mechanical fall earlier today without a LOC.  She presented to the ER for workup.  Of note, she normally lives at home but currently lives at a nursing home due to recuperation from a recent surgery.  She notes neck pain, but no arm pain, weakness, or other symptoms.  She is currently in a brace. Betty [name] has no symptoms of cervical myelopathy.   Review of Systems:  A 10 point review of systems is negative, except for the pertinent positives and negatives detailed in the HPI.  Past Medical History: Past Medical History:  Diagnosis Date  . Acute diastolic heart failure ([place])   . Allergy   . 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 Asthma   . Atrial fibrillation ([place])   . Backache, unspecified   . Cardiomegaly   . COPD (chronic obstructive pulmonary disease) ([place])   . Diabetes mellitus without complication ([place])   . 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 Esophageal reflux   . Headache(784.0)   . Herpes zoster without mention of complication   . Hx: UTI (urinary tract infection)   . Hypertension    heart controlled w CHF  . Nontoxic uninodular goiter   . Obesity, unspecified   . Osteoarthrosis, unspecified whether generalized or localized, unspecified site   . Unspecified sleep apnea   . Urine incontinence    hx of    Past Surgical History: Past Surgical History:  Procedure Laterality Date  . ABDOMINAL HYSTERECTOMY  1979   complete (for precancerous cells)  . ABLATION  2011 & 2014  . bladder botox  01/26/2018  . CHOLECYSTECTOMY    . CYSTOSCOPY WITH FULGERATION N/A 01/28/2018   Procedure: [place]  AND CLOT EVACUATION;  Surgeon: Hollice Espy, MD;  Location: [facility] ORS;  Service: Urology;  Laterality: N/A;  . Interstim Placement  2012  . OOPHORECTOMY      Allergies: Allergies as of 02/19/2018 - Review Complete 02/19/2018  Allergen Reaction Noted  . Flecainide Shortness Of Breath and Other (See Comments) 09/23/2016  . Amiodarone Other (See Comments) 05/28/2013    Medications:  Current Facility-Administered Medications:  .  0.9 %  sodium chloride infusion, , Intravenous, Continuous, Mody, Sital, MD, Last Rate: 75 mL/hr at 02/19/18 1144 .  acetaminophen (TYLENOL) tablet 650 mg, 650 mg, Oral, Q6H PRN **OR** acetaminophen (TYLENOL) suppository 650 mg, 650 mg, Rectal, Q6H PRN, Mody, Sital, MD .  albuterol (PROVENTIL) (2.5 MG/3ML) 0.083% nebulizer solution 2.5 mg, 2.5 mg, Nebulization, Q4H PRN, Mody, Sital, MD .  atorvastatin (LIPITOR) tablet 20 mg, 20 mg, Oral, Daily, Mody, Sital, MD, 20 mg at 02/19/18 1143 .  azaTHIOprine (IMURAN) tablet 100 mg, 100 mg, Oral, Daily, Mody, Sital, MD, 100 mg at 02/19/18 1144 .  bisacodyl (DULCOLAX) EC tablet 5 mg, 5 mg, Oral, Daily PRN, Mody, Sital, MD .  calcium-vitamin D (OSCAL WITH D) 500-200 MG-UNIT per tablet 1 tablet, 1 tablet, Oral, Daily, Mody, Sital, MD, 1 tablet at 02/19/18 1143 .  diltiazem (CARDIZEM CD) 24 hr capsule 180 mg, 180 mg, Oral, Daily, Mody, Sital, MD, 180 mg at 02/19/18 1144 .  [START ON 02/20/2018] ferrous sulfate  tablet 325 mg, 325 mg, Oral, Q breakfast, Mody, Sital, MD .  gabapentin (NEURONTIN) tablet 600 mg, 600 mg, Oral, BID, Mody, Sital, MD, 600 mg at 02/19/18 1143 .  insulin aspart (novoLOG) injection 0-9 Units, 0-9 Units, Subcutaneous, TID WC, Mody, Sital, MD .  Derrill Memo ON 02/20/2018] loratadine (CLARITIN) tablet 10 mg, 10 mg, Oral, Daily, Mody, Sital, MD .  magic mouthwash, 10 mL, Oral, Q6H PRN, Mody, Sital, MD .  metoprolol succinate (TOPROL-XL) 24 hr tablet 25 mg, 25 mg, Oral, BID, Mody, Sital, MD, 25 mg at 02/19/18 1143 .   ondansetron (ZOFRAN) tablet 4 mg, 4 mg, Oral, Q6H PRN **OR** ondansetron (ZOFRAN) injection 4 mg, 4 mg, Intravenous, Q6H PRN, Mody, Sital, MD .  opium-belladonna (B&O SUPPRETTES) 16.2-60 MG suppository 1 suppository, 1 suppository, Rectal, Q6H PRN, Mody, Sital, MD .  oxybutynin (DITROPAN) tablet 5 mg, 5 mg, Oral, TID, Mody, Sital, MD, 5 mg at 02/19/18 1143 .  pantoprazole (PROTONIX) EC tablet 40 mg, 40 mg, Oral, Daily, Mody, Sital, MD, 40 mg at 02/19/18 1143 .  polyethylene glycol (MIRALAX / GLYCOLAX) packet 17 g, 17 g, Oral, Daily PRN, Mody, Sital, MD .  propafenone (RYTHMOL) tablet 225 mg, 225 mg, Oral, Q12H, Mody, Sital, MD, 225 mg at 02/19/18 1144 .  rOPINIRole (REQUIP) tablet 4 mg, 4 mg, Oral, QHS, Mody, Sital, MD .  senna-docusate (Senokot-S) tablet 2 tablet, 2 tablet, Oral, BID, Mody, Sital, MD, 2 tablet at 02/19/18 1143 .  traMADol (ULTRAM) tablet 50 mg, 50 mg, Oral, Q6H PRN, Benjie Karvonen, Sital, MD, 50 mg at 02/19/18 1143 .  traZODone (DESYREL) tablet 150 mg, 150 mg, Oral, QHS PRN, Mody, Sital, MD .  venlafaxine (EFFEXOR) tablet 75 mg, 75 mg, Oral, TID, Mody, Sital, MD   Social History: Social History   Tobacco Use  . Smoking status: Former Smoker    Packs/day: 0.50    Years: 15.00    Pack years: 7.50    Types: Cigarettes  . Smokeless tobacco: Never Used  . Tobacco comment: Has a 20-pack-year history, qutting in 1970.   Substance Use Topics  . Alcohol use: No    Alcohol/week: 0.0 oz  . Drug use: No    Family Medical History: Family History  Problem Relation Age of Onset  . Heart attack Father   . Heart failure Father   . Arthritis Father   . Stroke Father   . Hypertension Father   . Coronary artery disease Brother   . Peripheral vascular disease Brother   . Arthritis Mother   . Colon cancer Mother        colon cancer  . Hypertension Mother   . Cancer Maternal Grandmother        colon cancer  . Arthritis Maternal Grandmother     Physical Examination: Vitals:    02/19/18 0900 02/19/18 1035  BP: 132/86 106/63  Pulse: 68 74  Resp: 19 16  Temp:  98.1 F (36.7 C)  SpO2: 93% 93%     General: Patient is well developed, well nourished, calm, collected, and in no apparent distress.  Psychiatric: Patient is non-anxious.  Head:  Pupils equal, round, and reactive to light.  ENT:  Oral mucosa appears well hydrated.  Neck:   Supple.  Full range of motion.  Respiratory: Patient is breathing without any difficulty.  Extremities: No edema.  Vascular: Palpable pulses in dorsal pedal vessels.  Skin:   On exposed skin, there are no abnormal skin lesions.  NEUROLOGICAL:  General: In no  acute distress.   Awake, alert, oriented to person, place, and time.  Pupils equal round and reactive to light.  Facial tone is symmetric.  Tongue protrusion is midline.  There is no pronator drift.  ROM of spine: in a brace.  Palpation of spine: tender.    Strength: Side Biceps Triceps Deltoid Interossei Grip Wrist Ext. Wrist Flex.  R 5 5 5 5 5 5 5   L 5 5 5 5 5 5 5    Side Iliopsoas Quads Hamstring PF DF EHL  R 5 5 5 5 5 5   L 5 5 5 5 5 5    Reflexes are 1+ and symmetric at the biceps, triceps, brachioradialis, patella and achilles.   Bilateral upper and lower extremity sensation is intact to light touch and pin prick.  Clonus is not present.  Toes are down-going.  Gait is untested.  Hoffmans negative. Imaging: CT Head C spine 02/19/18 IMPRESSION: 1. Positive for acute cervical spine fractures: - Hangman's fracture of C2. This fracture involves the bilateral transverse foramen, the posterior C2 body, and posterior inferior C2 endplate. This fracture is minimally displaced. - bilateral, lateral C1 ring fractures. These fractures are largely nondisplaced. 2. The remaining cervical spine appears stable and intact. Chronic lower cervical degenerative spinal stenosis. 3. No acute intracranial abnormality. Stable non contrast CT appearance of the brain. 4. Left  scalp hematoma with no underlying skull fracture.  Critical Value/emergent results were called by telephone at the time of interpretation on 02/19/2018 at 0747 hours to Dr. Reita Cliche who verbally acknowledged these results.   Electronically Signed   By: Genevie Ann M.D.   On: 02/19/2018 07:52  I have personally reviewed the images and agree with the above interpretation.  Assessment and Plan: Ms. Gow is a pleasant 72 y.o. female with C2 fracture, Effendi Class 1 Hangman's fracture.  - recommend CTA or MRA to rule out vertebral artery injury - Continue brace - follow up in clinic in 2-4 weeks. [phone] for appointment - She is not a great surgical candidate, and these fractures can be likely be treated successfully with a brace. - OK to mobilize with [employer] K. Izora Ribas MD, [facility]

## 2018-02-19 NOTE — H&P (Signed)
[place] [facility] [place] NAME: Betty [name]    MR#:  [id number]  DATE OF BIRTH:  April 01, 1946  DATE OF ADMISSION:  02/19/2018  PRIMARY CARE PHYSICIAN: Dion Body, MD   REQUESTING/REFERRING PHYSICIAN:  Dr. Reita Cliche  CHIEF COMPLAINT:   Fall HISTORY OF PRESENT ILLNESS:  Betty [name]  is a 72 y.o. female with a known history of chronic diastolic heart failure, vasculitis and COPD on 2 L of oxygen who presented from nursing home after a fall. Patient reports that she got off balance and fell on her head. CT scan of the neck shows C1/C2 fracture. Neurosurgery has been contacted. Neurosurgery is recommending hospitalization here at [facility] with a neck brace.  PAST MEDICAL HISTORY:   Past Medical History:  Diagnosis Date  . Acute diastolic heart failure ([place])   . Allergy   . 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 Asthma   . Atrial fibrillation ([place])   . Backache, unspecified   . Cardiomegaly   . COPD (chronic obstructive pulmonary disease) ([place])   . Diabetes mellitus without complication ([place])   . 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 Esophageal reflux   . Headache(784.0)   . Herpes zoster without mention of complication   . Hx: UTI (urinary tract infection)   . Hypertension    heart controlled w CHF  . Nontoxic uninodular goiter   . Obesity, unspecified   . Osteoarthrosis, unspecified whether generalized or localized, unspecified site   . Unspecified sleep apnea   . Urine incontinence    hx of    PAST SURGICAL HISTORY:   Past Surgical History:  Procedure Laterality Date  . ABDOMINAL HYSTERECTOMY  1979   complete (for precancerous cells)  . ABLATION  2011 & 2014  . bladder botox  01/26/2018  . CHOLECYSTECTOMY    . CYSTOSCOPY WITH FULGERATION N/A 01/28/2018   Procedure: [place] AND CLOT EVACUATION;  Surgeon: Hollice Espy, MD;  Location: [facility] ORS;  Service: Urology;  Laterality: N/A;   . Interstim Placement  2012  . OOPHORECTOMY      SOCIAL HISTORY:   Social History   Tobacco Use  . Smoking status: Former Smoker    Packs/day: 0.50    Years: 15.00    Pack years: 7.50    Types: Cigarettes  . Smokeless tobacco: Never Used  . Tobacco comment: Has a 20-pack-year history, qutting in 1970.   Substance Use Topics  . Alcohol use: No    Alcohol/week: 0.0 oz    FAMILY HISTORY:   Family History  Problem Relation Age of Onset  . Heart attack Father   . Heart failure Father   . Arthritis Father   . Stroke Father   . Hypertension Father   . Coronary artery disease Brother   . Peripheral vascular disease Brother   . Arthritis Mother   . Colon cancer Mother        colon cancer  . Hypertension Mother   . Cancer Maternal Grandmother        colon cancer  . Arthritis Maternal Grandmother     DRUG ALLERGIES:   Allergies  Allergen Reactions  . Flecainide Shortness Of Breath and Other (See Comments)    Reaction: dizziness   . Amiodarone Other (See Comments)    Pt states that this medication causes lung bleeding.      REVIEW OF SYSTEMS:   Review of Systems  Constitutional: Negative.  Negative  for chills, fever and malaise/fatigue.  HENT: Negative.  Negative for ear discharge, ear pain, hearing loss, nosebleeds and sore throat.   Eyes: Negative.  Negative for blurred vision and pain.  Respiratory: Negative.  Negative for cough, hemoptysis, shortness of breath and wheezing.   Cardiovascular: Negative.  Negative for chest pain, palpitations and leg swelling.  Gastrointestinal: Negative.  Negative for abdominal pain, blood in stool, diarrhea, nausea and vomiting.  Genitourinary: Negative.  Negative for dysuria.  Musculoskeletal: Positive for falls. Negative for back pain.  Skin: Negative.   Neurological: Negative for dizziness, tremors, speech change, focal weakness, seizures and headaches.  Endo/Heme/Allergies: Negative.  Does not bruise/bleed easily.   Psychiatric/Behavioral: Negative.  Negative for depression, hallucinations and suicidal ideas.    MEDICATIONS AT HOME:   Prior to Admission medications   Medication Sig Start Date End Date Taking? Authorizing Provider  acetaminophen (TYLENOL) 325 MG tablet Take 2 tablets (650 mg total) by mouth every 6 (six) hours as needed for mild pain (or Fever >/= 101). 02/04/18  Yes Gladstone Lighter, MD  albuterol (PROVENTIL) (2.5 MG/3ML) 0.083% nebulizer solution Take 3 mLs (2.5 mg total) by nebulization every 4 (four) hours as needed for wheezing or shortness of breath. 06/28/16  Yes Gladstone Lighter, MD  atorvastatin (LIPITOR) 20 MG tablet Take 20 mg by mouth daily.   Yes [provider]  azaTHIOprine (IMURAN) 50 MG tablet Take 100 mg by mouth daily.    Yes [provider]  diltiazem (CARDIZEM CD) 180 MG 24 hr capsule Take 180 mg by mouth daily. 06/23/17  Yes [provider]  diphenhydrAMINE (BENADRYL) 25 mg capsule Take 25 mg by mouth every 6 (six) hours as needed.   Yes [provider]  ferrous sulfate 325 (65 FE) MG EC tablet Take 325 mg by mouth daily.  06/23/17  Yes [provider]  fluticasone (FLONASE) 50 MCG/ACT nasal spray Place 2 sprays into the nose daily.  06/03/17 06/03/18 Yes [provider]  gabapentin (NEURONTIN) 600 MG tablet Take 600 mg by mouth 2 (two) times daily.   Yes [provider]  guaiFENesin-dextromethorphan (ROBITUSSIN DM) 100-10 MG/5ML syrup Take 5 mLs by mouth every 4 (four) hours as needed for cough. 02/04/18  Yes Gladstone Lighter, MD  loratadine (CLARITIN) 10 MG tablet Take 10 mg by mouth daily. 06/03/17  Yes [provider]  magic mouthwash SOLN Take 10 mLs by mouth every 6 (six) hours as needed for mouth pain.   Yes [provider]  magnesium oxide (MAG-OX) 400 (241.3 Mg) MG tablet TAKE 1 TABLET (400 MG TOTAL) BY MOUTH ONCE DAILY. 06/04/17  Yes [provider]  Melatonin 5 MG TABS Take 5  mg by mouth at bedtime as needed.   Yes [provider]  metFORMIN (GLUCOPHAGE) 1000 MG tablet Take 1,000 mg by mouth 2 (two) times daily with a meal.    Yes [provider]  metoprolol succinate (TOPROL-XL) 25 MG 24 hr tablet Take 25 mg by mouth 2 (two) times daily.    Yes [provider]  opium-belladonna (B&O SUPPRETTES) 16.2-60 MG suppository Place 1 suppository rectally every 6 (six) hours as needed for bladder spasms. 02/04/18  Yes Gladstone Lighter, MD  venlafaxine (EFFEXOR) 75 MG tablet Take 75 mg by mouth 3 (three) times daily. 06/24/17  Yes [provider]  albuterol (PROVENTIL HFA;VENTOLIN HFA) 108 (90 Base) MCG/ACT inhaler Inhale 2 puffs into the lungs every 4 (four) hours as needed for wheezing or shortness of breath.  01/17/16   Flora Lipps, MD  calcium-vitamin D (OSCAL WITH D) 500-200 MG-UNIT per tablet Take 1 tablet by mouth daily.    [provider]  enoxaparin (LOVENOX) 100 MG/ML injection Inject 0.95 mLs (95 mg total) into the skin every 12 (twelve) hours. X 3-4 days until INR reaches goal of 2.5 02/04/18   Gladstone Lighter, MD  Lancets 30G MISC Use 1 Units as directed. Check CBG's fasting once daily. Dx: E11.9 07/26/15   [provider]  metolazone (ZAROXOLYN) 2.5 MG tablet Take 2.5 mg by mouth 2 (two) times a week. Take 2.5 mg by mouth on Monday and Thursday.    [provider]  oxybutynin (DITROPAN) 5 MG tablet Take 5 mg by mouth 3 (three) times daily.     [provider]  pantoprazole (PROTONIX) 40 MG tablet Take 40 mg by mouth daily.    [provider]  potassium chloride SA (K-DUR,KLOR-CON) 20 MEQ tablet Take 1 tablet (20 mEq total) by mouth 2 (two) times daily. 02/04/18   Gladstone Lighter, MD  propafenone (RYTHMOL) 225 MG tablet Take 225 mg by mouth every 12 (twelve) hours.    [provider]  rOPINIRole (REQUIP) 4 MG tablet Take 4 mg by mouth at bedtime. 07/02/17   [provider]   senna-docusate (SENNA-PLUS) 8.6-50 MG tablet Take 2 tablets by mouth 2 (two) times daily.     [provider]  torsemide (DEMADEX) 20 MG tablet Take 80 mg by mouth 2 (two) times daily.     [provider]  traZODone (DESYREL) 150 MG tablet Take 1 tablet (150 mg total) by mouth at bedtime as needed for sleep. 02/04/18   Gladstone Lighter, MD  Vitamin D, Ergocalciferol, (DRISDOL) 50000 units CAPS capsule Take 50,000 Units by mouth every 7 (seven) days. Mondays only    [provider]  warfarin (COUMADIN) 6 MG tablet Take 6 mg by mouth daily at 6 PM.  07/09/17 07/09/18  [provider]      VITAL SIGNS:  Blood pressure 136/88, pulse 70, temperature 98.2 F (36.8 C), temperature source Oral, resp. rate 17, height 5\' 9"  (1.753 m), weight 105.2 kg (232 lb), SpO2 96 %.  PHYSICAL EXAMINATION:   Physical Exam  Constitutional: She is oriented to person, place, and time and well-developed, well-nourished, and in no distress. No distress.  HENT:  Head: Normocephalic.  Eyes: No scleral icterus.  Neck: No JVD present. No tracheal deviation present.  She has cervical collar placed  Cardiovascular: Normal rate and regular rhythm. Exam reveals no gallop and no friction rub.  Murmur heard. Pulmonary/Chest: Effort normal and breath sounds normal. No respiratory distress. She has no wheezes. She has no rales. She exhibits no tenderness.  Abdominal: Soft. Bowel sounds are normal. She exhibits no distension and no mass. There is no tenderness. There is no rebound and no guarding.  Musculoskeletal: Normal range of motion. She exhibits no edema.  Neurological: She is alert and oriented to person, place, and time.  Skin: Skin is warm. No rash noted. No erythema.  Psychiatric: Affect and judgment normal.      LABORATORY PANEL:   CBC Recent Labs  Lab 02/19/18 0709  WBC 12.6*  HGB 9.9*  HCT 31.4*  PLT 472*    ------------------------------------------------------------------------------------------------------------------  Chemistries  Recent Labs  Lab 02/19/18 0709  NA 129*  K 5.0  CL 88*  CO2 26  GLUCOSE 118*  BUN 47*  CREATININE 1.91*  CALCIUM 8.2*   ------------------------------------------------------------------------------------------------------------------  Cardiac Enzymes Recent Labs  Lab 02/19/18 0709  TROPONINI <0.03   ------------------------------------------------------------------------------------------------------------------  RADIOLOGY:  Ct Head Wo Contrast  Result Date: 02/19/2018 CLINICAL DATA:  72 year old female status post unwitnessed fall in bathroom. Forehead lacerations. Head neck and back pain EXAM: CT HEAD WITHOUT CONTRAST CT CERVICAL SPINE WITHOUT CONTRAST TECHNIQUE: Multidetector CT imaging of the head and cervical spine was performed following the standard protocol without intravenous contrast. Multiplanar CT image reconstructions of the cervical spine were also generated. COMPARISON:  CT head and face 07/11/2017. CT head and cervical spine 06/25/2016. FINDINGS: CT HEAD FINDINGS Brain: Chronic right caudate lacunar infarct. Mild for age cerebral white matter hypodensity. Stable gray-white matter differentiation throughout the brain. Cerebral volume remains normal. No midline shift, ventriculomegaly, mass effect, evidence of mass lesion, intracranial hemorrhage or evidence of cortically based acute infarction. No cortical encephalomalacia identified. Vascular: Calcified atherosclerosis at the skull base. No suspicious intracranial vascular hyperdensity. Skull: Stable. Hyperostosis (normal variant). No skull fracture identified. Sinuses/Orbits: Stable chronic paranasal sinus mucoperiosteal thickening since 2017. Tympanic cavities and mastoids remain clear. Other: Left forehead scalp hematoma measuring up to 7 millimeters in thickness. No soft tissue gas  identified. Underlying left frontal bone intact. Other scalp and orbits soft tissues are within normal limits. Negative visible deep soft tissue spaces of the face. CT CERVICAL SPINE FINDINGS Study is intermittently degraded by motion artifact despite repeated imaging attempts. Skull base and vertebrae: The skull base appears intact. There are nondisplaced bilateral lateral C1 ring fractures. That on the left side is also visible on the lowest head CT image (series 4, image 2). Congenital incomplete ossification of the posterior C1 ring. C1-occipital alignment remains normal. Superimposed hangman's type C2 fracture. See series 10 images 21 through 35. The fracture extends obliquely through the posterior C2 body and posterior inferior C2 endplate. On both sides the fracture propagates through of the foramen transversarium. This fracture is minimally displaced. The odontoid and atlanto dens interval remain normal. The C3 and lower cervical vertebrae appear stable and intact. No other cervical spine fracture identified. Alignment: Bilateral cervical posterior element alignment is stable since 2017. stable chronic chronic mild retrolisthesis of C5 on C6 and C6 on C7. Soft tissues and spinal canal: No prevertebral fluid or swelling. No visible canal hematoma. Disc levels: Chronic lower cervical spine degenerative spinal stenosis related to mild spondylolisthesis plus chronic disc and endplate degeneration at C5-C6 and C6-C7. There is chronic severe right side facet degeneration at C4-C5. Upper chest: The visible upper thoracic levels appear intact. Stable lung apices. Negative noncontrast thoracic inlet. IMPRESSION: 1. Positive for acute cervical spine fractures: - Hangman's fracture of C2. This fracture involves the bilateral transverse foramen, the posterior C2 body, and posterior inferior C2 endplate. This fracture is minimally displaced. - bilateral, lateral C1 ring fractures. These fractures are largely  nondisplaced. 2. The remaining cervical spine appears stable and intact. Chronic lower cervical degenerative spinal stenosis. 3. No acute intracranial abnormality. Stable non contrast CT appearance of the brain. 4. Left scalp hematoma with no underlying skull fracture. Critical Value/emergent results were called by telephone at the time of interpretation on 02/19/2018 at 0747 hours to Dr. Reita Cliche who verbally acknowledged these results. Electronically Signed   By: Genevie Ann M.D.   On: 02/19/2018 07:52   Ct Cervical Spine Wo Contrast  Result Date: 02/19/2018 CLINICAL DATA:  72 year old female status post unwitnessed fall in bathroom. Forehead lacerations. Head neck and back pain EXAM: CT HEAD WITHOUT CONTRAST CT CERVICAL SPINE WITHOUT CONTRAST  TECHNIQUE: Multidetector CT imaging of the head and cervical spine was performed following the standard protocol without intravenous contrast. Multiplanar CT image reconstructions of the cervical spine were also generated. COMPARISON:  CT head and face 07/11/2017. CT head and cervical spine 06/25/2016. FINDINGS: CT HEAD FINDINGS Brain: Chronic right caudate lacunar infarct. Mild for age cerebral white matter hypodensity. Stable gray-white matter differentiation throughout the brain. Cerebral volume remains normal. No midline shift, ventriculomegaly, mass effect, evidence of mass lesion, intracranial hemorrhage or evidence of cortically based acute infarction. No cortical encephalomalacia identified. Vascular: Calcified atherosclerosis at the skull base. No suspicious intracranial vascular hyperdensity. Skull: Stable. Hyperostosis (normal variant). No skull fracture identified. Sinuses/Orbits: Stable chronic paranasal sinus mucoperiosteal thickening since 2017. Tympanic cavities and mastoids remain clear. Other: Left forehead scalp hematoma measuring up to 7 millimeters in thickness. No soft tissue gas identified. Underlying left frontal bone intact. Other scalp and orbits soft  tissues are within normal limits. Negative visible deep soft tissue spaces of the face. CT CERVICAL SPINE FINDINGS Study is intermittently degraded by motion artifact despite repeated imaging attempts. Skull base and vertebrae: The skull base appears intact. There are nondisplaced bilateral lateral C1 ring fractures. That on the left side is also visible on the lowest head CT image (series 4, image 2). Congenital incomplete ossification of the posterior C1 ring. C1-occipital alignment remains normal. Superimposed hangman's type C2 fracture. See series 10 images 21 through 35. The fracture extends obliquely through the posterior C2 body and posterior inferior C2 endplate. On both sides the fracture propagates through of the foramen transversarium. This fracture is minimally displaced. The odontoid and atlanto dens interval remain normal. The C3 and lower cervical vertebrae appear stable and intact. No other cervical spine fracture identified. Alignment: Bilateral cervical posterior element alignment is stable since 2017. stable chronic chronic mild retrolisthesis of C5 on C6 and C6 on C7. Soft tissues and spinal canal: No prevertebral fluid or swelling. No visible canal hematoma. Disc levels: Chronic lower cervical spine degenerative spinal stenosis related to mild spondylolisthesis plus chronic disc and endplate degeneration at C5-C6 and C6-C7. There is chronic severe right side facet degeneration at C4-C5. Upper chest: The visible upper thoracic levels appear intact. Stable lung apices. Negative noncontrast thoracic inlet. IMPRESSION: 1. Positive for acute cervical spine fractures: - Hangman's fracture of C2. This fracture involves the bilateral transverse foramen, the posterior C2 body, and posterior inferior C2 endplate. This fracture is minimally displaced. - bilateral, lateral C1 ring fractures. These fractures are largely nondisplaced. 2. The remaining cervical spine appears stable and intact. Chronic lower  cervical degenerative spinal stenosis. 3. No acute intracranial abnormality. Stable non contrast CT appearance of the brain. 4. Left scalp hematoma with no underlying skull fracture. Critical Value/emergent results were called by telephone at the time of interpretation on 02/19/2018 at 0747 hours to Dr. Reita Cliche who verbally acknowledged these results. Electronically Signed   By: Genevie Ann M.D.   On: 02/19/2018 07:52    EKG:   Paced rhythm  IMPRESSION AND PLAN:   72 year old female with history of diabetes, chronic diastolic heart failure who presented after mechanical fall and has suffered acute C1/C2 fracture  1. Acute C1/C2 fracture: Continue with neurosurgery evaluation She is not a candidate for a low placement according to neurosurgery. She will need an MRI or CTA however creatinine is elevated and therefore will may try this tomorrow In to me with cervical brace  2. Acute kidney injury: Continue IV fluids hold nephrotoxic medications and  repeat BMP in a.m.  3. Hyponatremia likely in the setting of dehydration Continue IV fluids and recheck in a.m.  4. Vasculitis: Continue Imuran   5. Diabetes: Continue sliding scale and ADA diet  6. Essential hypertension: Continue metoprolol and diltiazem  7. Hyperlipidemia: Continue statin     All the records are reviewed and case discussed with ED provider. Management plans discussed with the patient and she isin agreement  CODE STATUS: Full   TOTAL TIME TAKING CARE OF THIS PATIENT: 42 minutes.    Shardai Star M.D on 02/19/2018 at 9:03 AM  Between 7am to 6pm - Pager - [phone]  After 6pm go to [email or web address] - password [email or web address] [facility]  Office  [phone]  CC: Primary care physician; Dion Body, MD

## 2018-02-19 NOTE — ED Provider Notes (Signed)
[facility] Emergency Department Provider Note ____________________________________________   I have reviewed the triage vital signs and the triage nursing note.  HISTORY  Chief Complaint Fall and Head Injury   Historian Patient and son   HPI Betty [name] is a 72 y.o. female who lives alone, states that she just got off balance and fell striking her head today.  She states that she was able to get up from the floor quite quickly.  No clear loss of consciousness.  She is complaining of some lower neck pain without any numbness or tingling.  She has had some nausea for the past couple days, but no fevers or vomiting or diarrhea.  No abdominal pain.  No traumatic complaints to the chest or the abdomen or the low back.  No traumatic complaints to the extremities.  She is on Coumadin and held dose last night due to apparently elevated INR.  States that she has had falls from being off balance in the past.  States that she thinks that she is still completing a course of antibiotic for recent urinary tract infection.  Denies any ongoing urinary symptoms.  Moderate headache and neck pain.   Past Medical History:  Diagnosis Date  . Acute diastolic heart failure ([place])   . Allergy   . 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 Asthma   . Atrial fibrillation ([place])   . Backache, unspecified   . Cardiomegaly   . COPD (chronic obstructive pulmonary disease) ([place])   . Diabetes mellitus without complication ([place])   . 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 Esophageal reflux   . Headache(784.0)   . Herpes zoster without mention of complication   . Hx: UTI (urinary tract infection)   . Hypertension    heart controlled w CHF  . Nontoxic uninodular goiter   . Obesity, unspecified   . Osteoarthrosis, unspecified whether generalized or localized, unspecified site   . Unspecified sleep apnea   . Urine incontinence    hx of    Patient Active  Problem List   Diagnosis Date Noted  . Hematuria 01/27/2018  . Carotid stenosis 12/23/2017  . Varicose veins of both lower extremities with pain 11/17/2017  . Leg pain 07/14/2017  . Chronic venous insufficiency 07/14/2017  . PAD (peripheral artery disease) ([place]) 07/14/2017  . Postprocedural hemorrhage due to complication of oral surgery 03/27/2017  . Acute blood loss anemia 03/27/2017  . H/O mitral valve replacement with mechanical valve 03/27/2017  . Syncope 06/25/2016  . UTI (lower urinary tract infection) 06/25/2016  . A-fib ([place]) 05/10/2016  . Dyspnea 05/10/2016  . Acute diastolic CHF (congestive heart failure) ([place]) 05/10/2016  . Anemia 05/10/2016  . Anemia 05/10/2016  . Other specified abnormal immunological findings in serum 04/24/2016  . Sepsis ([place]) 04/01/2016  . Prolonged Q-T interval on ECG 03/13/2016  . Mitral stenosis 03/13/2016  . Essential hypertension, benign 03/13/2016  . Diastolic congestive heart failure ([place]) 03/13/2016  . Interstitial lung disease ([place]) 12/29/2015  . Chronic LBP 07/26/2015  . Essential (primary) hypertension 07/26/2015  . Idiopathic insomnia 07/26/2015  . Restless leg 07/26/2015  . Obesity (BMI 30-39.9) 01/07/2015  . Abnormal EKG 09/11/2014  . External nasal lesion 02/26/2014  . Abnormal liver function tests 01/28/2014  . Ankle pain, right 01/28/2014  . Arthralgia of ankle or foot 01/28/2014  . Urinary frequency 12/24/2013  . Hemorrhoids 12/24/2013  . Neck pain on right side 11/10/2013  . Vitamin D deficiency  09/09/2013  . Obstructive sleep apnea 05/30/2013  . Urinary incontinence 05/30/2013  . Diabetes ([place]) 05/30/2013  . History of colonic polyps 05/30/2013  . Controlled type 2 diabetes mellitus without complication ([place]) 09/32/3557  . H/O deep venous thrombosis 05/05/2013  . Depression 04/13/2009  . THYROID NODULE 04/13/2009  . 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 GERD 04/13/2009  . OSTEOARTHRITIS 04/13/2009  . BACK  PAIN 04/13/2009  . HEADACHE 04/13/2009  . Gastro-esophageal reflux disease without esophagitis 04/13/2009  . Mild episode of recurrent major depressive disorder ([place]) 04/13/2009  . Benign hypertensive heart disease with heart failure ([place]) 03/28/2009  . ATRIAL FIBRILLATION 03/28/2009  . DIASTOLIC HEART FAILURE, ACUTE 03/28/2009  . VENTRICULAR HYPERTROPHY, LEFT 03/28/2009    Past Surgical History:  Procedure Laterality Date  . ABDOMINAL HYSTERECTOMY  1979   complete (for precancerous cells)  . ABLATION  2011 & 2014  . bladder botox  01/26/2018  . CHOLECYSTECTOMY    . CYSTOSCOPY WITH FULGERATION N/A 01/28/2018   Procedure: [place] AND CLOT EVACUATION;  Surgeon: Hollice Espy, MD;  Location: [facility] ORS;  Service: Urology;  Laterality: N/A;  . Interstim Placement  2012  . OOPHORECTOMY      Prior to Admission medications   Medication Sig Start Date End Date Taking? Authorizing Provider  acetaminophen (TYLENOL) 325 MG tablet Take 2 tablets (650 mg total) by mouth every 6 (six) hours as needed for mild pain (or Fever >/= 101). 02/04/18  Yes Gladstone Lighter, MD  albuterol (PROVENTIL) (2.5 MG/3ML) 0.083% nebulizer solution Take 3 mLs (2.5 mg total) by nebulization every 4 (four) hours as needed for wheezing or shortness of breath. 06/28/16  Yes Gladstone Lighter, MD  atorvastatin (LIPITOR) 20 MG tablet Take 20 mg by mouth daily.   Yes [provider]  opium-belladonna (B&O SUPPRETTES) 16.2-60 MG suppository Place 1 suppository rectally every 6 (six) hours as needed for bladder spasms. 02/04/18  Yes Gladstone Lighter, MD  albuterol (PROVENTIL HFA;VENTOLIN HFA) 108 (90 Base) MCG/ACT inhaler Inhale 2 puffs into the lungs every 4 (four) hours as needed for wheezing or shortness of breath. 01/17/16   Flora Lipps, MD  azaTHIOprine (IMURAN) 50 MG tablet Take 100 mg by mouth daily.     [provider]  calcium-vitamin D (OSCAL WITH D) 500-200 MG-UNIT per tablet Take  1 tablet by mouth daily.    [provider]  diltiazem (CARDIZEM CD) 180 MG 24 hr capsule Take 180 mg by mouth daily. 06/23/17   [provider]  enoxaparin (LOVENOX) 100 MG/ML injection Inject 0.95 mLs (95 mg total) into the skin every 12 (twelve) hours. X 3-4 days until INR reaches goal of 2.5 02/04/18   Gladstone Lighter, MD  ferrous sulfate 325 (65 FE) MG EC tablet Take 325 mg by mouth daily.  06/23/17   [provider]  fluticasone (FLONASE) 50 MCG/ACT nasal spray Place 2 sprays into the nose daily.  06/03/17 06/03/18  [provider]  gabapentin (NEURONTIN) 600 MG tablet Take 600 mg by mouth 2 (two) times daily.    [provider]  guaiFENesin-dextromethorphan (ROBITUSSIN DM) 100-10 MG/5ML syrup Take 5 mLs by mouth every 4 (four) hours as needed for cough. 02/04/18   Gladstone Lighter, MD  Lancets 30G MISC Use 1 Units as directed. Check CBG's fasting once daily. Dx: E11.9 07/26/15   [provider]  loratadine (CLARITIN) 10 MG tablet Take 10 mg by mouth daily. 06/03/17   [provider]  magnesium oxide (MAG-OX) 400 (  241.3 Mg) MG tablet TAKE 1 TABLET (400 MG TOTAL) BY MOUTH ONCE DAILY. 06/04/17   [provider]  metFORMIN (GLUCOPHAGE) 1000 MG tablet Take 1,000 mg by mouth 2 (two) times daily with a meal.     [provider]  metolazone (ZAROXOLYN) 2.5 MG tablet Take 2.5 mg by mouth 2 (two) times a week. Take 2.5 mg by mouth on Monday and Thursday.    [provider]  metoprolol succinate (TOPROL-XL) 25 MG 24 hr tablet Take 25 mg by mouth daily. 06/23/17   [provider]  oxybutynin (DITROPAN) 5 MG tablet Take 5 mg by mouth 3 (three) times daily.     [provider]  pantoprazole (PROTONIX) 40 MG tablet Take 40 mg by mouth daily.    [provider]  potassium chloride SA (K-DUR,KLOR-CON) 20 MEQ tablet Take 1 tablet (20 mEq total) by mouth 2 (two) times daily. 02/04/18   Gladstone Lighter, MD   propafenone (RYTHMOL) 225 MG tablet Take 225 mg by mouth every 12 (twelve) hours.    [provider]  rOPINIRole (REQUIP) 4 MG tablet Take 4 mg by mouth at bedtime. 07/02/17   [provider]  senna-docusate (SENNA-PLUS) 8.6-50 MG tablet Take 2 tablets by mouth 2 (two) times daily.     [provider]  torsemide (DEMADEX) 20 MG tablet Take 80 mg by mouth 2 (two) times daily.     [provider]  traZODone (DESYREL) 150 MG tablet Take 1 tablet (150 mg total) by mouth at bedtime as needed for sleep. 02/04/18   Gladstone Lighter, MD  venlafaxine (EFFEXOR) 75 MG tablet Take 75 mg by mouth 3 (three) times daily. 06/24/17   [provider]  Vitamin D, Ergocalciferol, (DRISDOL) 50000 units CAPS capsule Take 50,000 Units by mouth every 7 (seven) days. Mondays only    [provider]  warfarin (COUMADIN) 6 MG tablet Take 6 mg by mouth daily at 6 PM.  07/09/17 07/09/18  [provider]    Allergies  Allergen Reactions  . Flecainide Shortness Of Breath and Other (See Comments)    Reaction: dizziness   . Amiodarone Other (See Comments)    Pt states that this medication causes lung bleeding.      Family History  Problem Relation Age of Onset  . Heart attack Father   . Heart failure Father   . Arthritis Father   . Stroke Father   . Hypertension Father   . Coronary artery disease Brother   . Peripheral vascular disease Brother   . Arthritis Mother   . Colon cancer Mother        colon cancer  . Hypertension Mother   . Cancer Maternal Grandmother        colon cancer  . Arthritis Maternal Grandmother     Social History Social History   Tobacco Use  . Smoking status: Former Smoker    Packs/day: 0.50    Years: 15.00    Pack years: 7.50    Types: Cigarettes  . Smokeless tobacco: Never Used  . Tobacco comment: Has a 20-pack-year history, qutting in 1970.   Substance Use Topics  . Alcohol use: No    Alcohol/week: 0.0 oz  . Drug  use: No    Review of Systems  Constitutional: Negative for recent fevers. Eyes: Negative for visual changes. ENT: Negative for sore throat. Cardiovascular: Negative for chest pain. Respiratory: Negative for shortness of breath. Gastrointestinal: Negative for abdominal pain, vomiting and diarrhea.  For recent nausea. Genitourinary: Negative for dysuria.  Recent UTI, as per HPI Musculoskeletal: Negative for back pain.  Positive for lower neck pain. Skin: Negative for rash. Neurological: Positive for moderate global headache.  ____________________________________________   PHYSICAL EXAM:  VITAL SIGNS: ED Triage Vitals  Enc Vitals Group     BP 02/19/18 0705 136/88     Pulse Rate 02/19/18 0705 70     Resp 02/19/18 0705 17     Temp 02/19/18 0705 98.2 F (36.8 C)     Temp Source 02/19/18 0705 Oral     SpO2 02/19/18 0701 97 %     Weight 02/19/18 0706 232 lb (105.2 kg)     Height 02/19/18 0706 5\' 9"  (1.753 m)     Head Circumference --      [facility] Flow --      Pain Score 02/19/18 0706 7     Pain Loc --      Pain Edu? --      Excl. in [place]? --      Constitutional: Alert and oriented. Well appearing and in no distress. HEENT   Head: Normocephalic.  Abrasion to the forehead.  No battle sign.      Eyes: Conjunctivae are normal. Pupils equal and round.       Ears:         Nose: No congestion/rhinnorhea.   Mouth/Throat: Mucous membranes are very dry.   Neck: No stridor.  Lower midline and paraspinous C-spine tenderness to palpation.  C-collar on. Cardiovascular/Chest: Normal rate, regular rhythm.  No murmurs, rubs, or gallops. Respiratory: Normal respiratory effort without tachypnea nor retractions. Breath sounds are clear and equal bilaterally. No wheezes/rales/rhonchi. Gastrointestinal: Soft. No distention, no guarding, no rebound. Nontender.  Obese Genitourinary/rectal:Deferred Musculoskeletal: Nontender with normal range of motion in all extremities. No joint effusions.   No lower extremity tenderness.  No edema.  Small areas of bruising on the right upper extremity.  Small abrasions to the anterior aspect of both knees. Neurologic: No facial droop.  Normal speech and language. No gross or focal neurologic deficits are appreciated. Skin:  Skin is warm, dry. No rash noted. Psychiatric: Mood and affect are normal. Speech and behavior are normal. Patient exhibits appropriate insight and judgment.   ____________________________________________  LABS (pertinent positives/negatives) I, Lisa Roca, MD the attending physician have reviewed the labs noted below.  Labs Reviewed  BASIC METABOLIC PANEL - Abnormal; Notable for the following components:      Result Value   Sodium 129 (*)    Chloride 88 (*)    Glucose, Bld 118 (*)    BUN 47 (*)    Creatinine, Ser 1.91 (*)    Calcium 8.2 (*)    GFR calc non Af Amer 25 (*)    GFR calc Af Amer 29 (*)    All other components within normal limits  CBC - Abnormal; Notable for the following components:   WBC 12.6 (*)    RBC 3.78 (*)    Hemoglobin 9.9 (*)    HCT 31.4 (*)    MCHC 31.6 (*)    RDW 20.0 (*)    Platelets 472 (*)    All other components within normal limits  PROTIME-INR - Abnormal; Notable for the following components:   Prothrombin Time 42.0 (*)    INR 4.44 (*)    All other components within normal limits  URINALYSIS, COMPLETE (UACMP) WITH MICROSCOPIC - Abnormal; Notable for the following components:   Color, Urine YELLOW (*)  APPearance HAZY (*)    Bacteria, UA RARE (*)    Squamous Epithelial / LPF 0-5 (*)    All other components within normal limits  TROPONIN I    ____________________________________________    EKG I, Lisa Roca, MD, the attending physician have personally viewed and interpreted all ECGs.  70 bpm.  AV dual paced rhythm. ____________________________________________  RADIOLOGY   CT head without contrast radiologist report - also discussed by phone with  radiologist:  IMPRESSION: 1. Positive for acute cervical spine fractures: - Hangman's fracture of C2. This fracture involves the bilateral transverse foramen, the posterior C2 body, and posterior inferior C2 endplate. This fracture is minimally displaced. - bilateral, lateral C1 ring fractures. These fractures are largely nondisplaced. 2. The remaining cervical spine appears stable and intact. Chronic lower cervical degenerative spinal stenosis. 3. No acute intracranial abnormality. Stable non contrast CT appearance of the brain. 4. Left scalp hematoma with no underlying skull fracture. __________________________________________  PROCEDURES  Procedure(s) performed: None  Critical Care performed: None   ____________________________________________  ED COURSE / ASSESSMENT AND PLAN  Pertinent labs & imaging results that were available during my care of the patient were reviewed by me and considered in my medical decision making (see chart for details).    Patient is a bit of a poor historian, but states that she just kind of fell over.  States that this has happened to her before.  She is on Coumadin.  I took a phone call from the radiologist indicating no sign of head bleed or traumatic intracranial findings, but on C-spine imaging there are C1 and C2 fractures.  Patient is not having any neurologic findings on exam.  I spoke with neurosurgeon consultant Dr.Yarbrough who indicated these appear to be stable and does not need any sort of emergency surgical management, will need [place] J placement through outside [employer].  I placed this order.  He will see the patient here in the hospital, patient will be admitted to the hospitalist service for further management of her hyponatremia and acute renal failure which I suspect is likely dehydration related.  Initially given 500 cc, will give an additional 500 cc now in the emergency department.  Neurosurgeon recommended  hospital evaluation with CTA neck or MRA neck to evaluate for any vertebral artery involvement in terms of long-term anticoagulation, but does not recommend that this has to happen in the ER especially given the patient's GFR status in acute renal failure right now.  I was able confirm that external collar is okay for patient to be sitting upright here in the ED and beyond without further restrictions as long as wearing a collar.[employer] has Designer, multimedia available and I confirmed with neurosurgeon that this is okay.  DIFFERENTIAL DIAGNOSIS: Including but not limited to traumatic intracranial injury, skull fracture, head bleed, traumatic cervical spine fracture, causes of fall including dehydration or electrolyte disturbance, urinary tract infection, anemia, etc.  CONSULTATIONS:   Dr. Izora Ribas, neurosurgery, recommends no surgical intervention, ok to stay at [facility] - will be seen by Dr.Yarbrough. Recommends [place] J collar.  Recommends CTA neck vs. MRA neck for decision around longterm anticoagulation for vertebral artery involvement -- not necessary in the immediate ER stay until collar fitted and GFR addressed.  Hospitalist for admission.  Discussed with Dr. Earleen Newport.   Patient / Family / Caregiver informed of clinical course, medical decision-making process, and agree with plan.     ___________________________________________   FINAL CLINICAL IMPRESSION(S) / ED DIAGNOSES  Final diagnoses:  Acute renal failure, unspecified acute renal failure type (HCC)  Hyponatremia  Supratherapeutic INR  Hangman's fracture, closed, initial encounter ([place])  Closed displaced fracture of first cervical vertebra, unspecified fracture morphology, initial encounter ([place])      ___________________________________________        Note: This dictation was prepared with Dragon dictation. Any transcriptional errors that result from this process are unintentional    Lisa Roca, MD 02/19/18  [phone]

## 2018-02-19 NOTE — Consult Note (Signed)
Received consult for C1-2 fractures.  She has the following fractures on CT: IMPRESSION: 1. Positive for acute cervical spine fractures: - Hangman's fracture of C2. This fracture involves the bilateral transverse foramen, the posterior C2 body, and posterior inferior C2 endplate. This fracture is minimally displaced. - bilateral, lateral C1 ring fractures. These fractures are largely nondisplaced. 2. The remaining cervical spine appears stable and intact. Chronic lower cervical degenerative spinal stenosis. 3. No acute intracranial abnormality. Stable non contrast CT appearance of the brain. 4. Left scalp hematoma with no underlying skull fracture.  Critical Value/emergent results were called by telephone at the time of interpretation on 02/19/2018 at 0747 hours to Dr. Reita Cliche who verbally acknowledged these results.   Electronically Signed   By: Genevie Ann M.D.   On: 02/19/2018 07:52    This is a type 1 Hangman's fracture with minimal displacement.  She is not a candidate for halo placement.  External orthosis is the appropriate treatment for now - [place] J collar.  Due to fractures involving foramina transversaria, she will need either time-of-flight MRA or CTA.  This will determine the utility of aspirin treatment.  She is stable for admission to a medical service for evaluation and treatment of her hyponatremia and renal failure.

## 2018-02-19 NOTE — ED Triage Notes (Signed)
Pt arrives to ED via [facility] from [facility] s/p unwitnessed fall with head injury. Pt reports loss of balance when reaching into a bathroom cabinet; denies any s/x's of dizziness or lightheadedness prior to incident. Pt presents with 2 lacerations to forehead, bleeding stopped at this time. Pt c/o head, neck, and back pain; arrived with c-collar in place. Of note, [facility] staff member called ED prior to EMS transport to make ED staff aware that pt's last PT/INR labs were abnormally elavated and Coumadin has been withheld since 6pm Wednesday evening.

## 2018-02-19 NOTE — Progress Notes (Signed)
[facility] for Warfarin Indication: atrial fibrillation  Allergies  Allergen Reactions  . Flecainide Shortness Of Breath and Other (See Comments)    Reaction: dizziness   . Amiodarone Other (See Comments)    Pt states that this medication causes lung bleeding.      Patient Measurements: Height: 5\' 9"  (175.3 cm) Weight: 232 lb (105.2 kg) IBW/kg (Calculated) : 66.2  Vital Signs: Temp: 98.1 F (36.7 C) (02/28 1035) Temp Source: Oral (02/28 1035) BP: 106/63 (02/28 1035) Pulse Rate: 74 (02/28 1035)  Labs: Recent Labs    02/19/18 0709  HGB 9.9*  HCT 31.4*  PLT 472*  LABPROT 42.0*  INR 4.44*  CREATININE 1.91*  TROPONINI <0.03    Estimated Creatinine Clearance: 34.9 mL/min (A) (by C-G formula based on SCr of 1.91 mg/dL (H)).   Medical History: Past Medical History:  Diagnosis Date  . Acute diastolic heart failure ([place])   . Allergy   . 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 Asthma   . Atrial fibrillation ([place])   . Backache, unspecified   . Cardiomegaly   . COPD (chronic obstructive pulmonary disease) ([place])   . Diabetes mellitus without complication ([place])   . 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 Esophageal reflux   . Headache(784.0)   . Herpes zoster without mention of complication   . Hx: UTI (urinary tract infection)   . Hypertension    heart controlled w CHF  . Nontoxic uninodular goiter   . Obesity, unspecified   . Osteoarthrosis, unspecified whether generalized or localized, unspecified site   . Unspecified sleep apnea   . Urine incontinence    hx of   Assessment: 72 y/o F with a h/o atrial fibrillation on warfarin admitted [facility] at [facility] with C1/C2 fractures. Patient was taking warfarin 6 mg daily PTA and is admitted with elevated INR. CT with no acute intracranial abnormality but left scalp hematoma. No surgery with plan for neck brace only per neurosurgery.   Goal of Therapy:  INR  2-3   Plan:  Will hold warfarin and continue to follow daily INR.   Ulice Dash D 02/19/2018,12:53 PM

## 2018-02-19 NOTE — Plan of Care (Signed)
Pt continues to require 4 liters of 02. Pt on tele and cont. 02

## 2018-02-20 DIAGNOSIS — J849 Interstitial pulmonary disease, unspecified: Secondary | ICD-10-CM

## 2018-02-20 DIAGNOSIS — N179 Acute kidney failure, unspecified: Secondary | ICD-10-CM

## 2018-02-20 DIAGNOSIS — I959 Hypotension, unspecified: Secondary | ICD-10-CM

## 2018-02-20 DIAGNOSIS — J9621 Acute and chronic respiratory failure with hypoxia: Secondary | ICD-10-CM

## 2018-02-20 DIAGNOSIS — J9622 Acute and chronic respiratory failure with hypercapnia: Secondary | ICD-10-CM

## 2018-02-20 DIAGNOSIS — G9340 Encephalopathy, unspecified: Secondary | ICD-10-CM

## 2018-02-20 LAB — CBC
HCT: 29.6 % — ABNORMAL LOW (ref 35.0–47.0)
Hemoglobin: 9.5 g/dL — ABNORMAL LOW (ref 12.0–16.0)
MCH: 27.2 pg (ref 26.0–34.0)
MCHC: 31.9 g/dL — AB (ref 32.0–36.0)
MCV: 85.2 fL (ref 80.0–100.0)
PLATELETS: 422 10*3/uL (ref 150–440)
RBC: 3.48 MIL/uL — ABNORMAL LOW (ref 3.80–5.20)
RDW: 19.3 % — AB (ref 11.5–14.5)
WBC: 21.5 10*3/uL — ABNORMAL HIGH (ref 3.6–11.0)

## 2018-02-20 LAB — BLOOD GAS, ARTERIAL
ACID-BASE DEFICIT: 13.4 mmol/L — AB (ref 0.0–2.0)
BICARBONATE: 17.6 mmol/L — AB (ref 20.0–28.0)
FIO2: 1
O2 SAT: 97.1 %
Patient temperature: 37
pCO2 arterial: 68 mmHg (ref 32.0–48.0)
pH, Arterial: 7.02 — CL (ref 7.350–7.450)
pO2, Arterial: 129 mmHg — ABNORMAL HIGH (ref 83.0–108.0)

## 2018-02-20 LAB — BASIC METABOLIC PANEL
Anion gap: 15 (ref 5–15)
BUN: 50 mg/dL — AB (ref 6–20)
CO2: 22 mmol/L (ref 22–32)
CREATININE: 2.03 mg/dL — AB (ref 0.44–1.00)
Calcium: 8 mg/dL — ABNORMAL LOW (ref 8.9–10.3)
Chloride: 94 mmol/L — ABNORMAL LOW (ref 101–111)
GFR calc non Af Amer: 24 mL/min — ABNORMAL LOW (ref >60)
GFR, EST AFRICAN AMERICAN: 27 mL/min — AB (ref >60)
GLUCOSE: 158 mg/dL — AB (ref 65–99)
Potassium: 5.4 mmol/L — ABNORMAL HIGH (ref 3.5–5.1)
Sodium: 131 mmol/L — ABNORMAL LOW (ref 135–145)

## 2018-02-20 LAB — PROCALCITONIN: Procalcitonin: 0.87 ng/mL

## 2018-02-20 LAB — GLUCOSE, CAPILLARY
GLUCOSE-CAPILLARY: 214 mg/dL — AB (ref 65–99)
Glucose-Capillary: 121 mg/dL — ABNORMAL HIGH (ref 65–99)
Glucose-Capillary: 144 mg/dL — ABNORMAL HIGH (ref 65–99)
Glucose-Capillary: 31 mg/dL — CL (ref 65–99)
Glucose-Capillary: 221 mg/dL — ABNORMAL HIGH (ref 65–99)
Glucose-Capillary: 199 mg/dL — ABNORMAL HIGH (ref 65–99)
Glucose-Capillary: 171 mg/dL — ABNORMAL HIGH (ref 65–99)
Glucose-Capillary: 168 mg/dL — ABNORMAL HIGH (ref 65–99)

## 2018-02-20 LAB — MRSA PCR SCREENING: MRSA BY PCR: NEGATIVE

## 2018-02-20 LAB — PROTIME-INR
INR: 5.01
PROTHROMBIN TIME: 46.1 s — AB (ref 11.4–15.2)

## 2018-02-20 LAB — CORTISOL: Cortisol, Plasma: 28.2 ug/dL

## 2018-02-20 MED ORDER — SODIUM CHLORIDE 0.9 % IV SOLN
0.0000 ug/min | INTRAVENOUS | Status: DC
  Filled 2018-02-20: qty 4

## 2018-02-20 MED ORDER — INSULIN ASPART 100 UNIT/ML IV SOLN
10.0000 [IU] | Freq: Once | INTRAVENOUS | Status: AC
  Administered 2018-02-20: 10 [IU] via INTRAVENOUS
  Filled 2018-02-20: qty 0.1

## 2018-02-20 MED ORDER — SODIUM CHLORIDE 0.9 % IV SOLN
0.0000 ug/min | INTRAVENOUS | Status: DC
  Administered 2018-02-20: 20 ug/min via INTRAVENOUS
  Filled 2018-02-20: qty 1

## 2018-02-20 MED ORDER — SODIUM BICARBONATE 8.4 % IV SOLN
INTRAVENOUS | Status: DC
  Administered 2018-02-20: 09:00:00 via INTRAVENOUS
  Filled 2018-02-20 (×3): qty 100

## 2018-02-20 MED ORDER — HYDROCORTISONE NA SUCCINATE PF 100 MG IJ SOLR
50.0000 mg | Freq: Three times a day (TID) | INTRAMUSCULAR | Status: DC
  Administered 2018-02-20 – 2018-02-21 (×2): 50 mg via INTRAVENOUS
  Filled 2018-02-20 (×2): qty 2

## 2018-02-20 MED ORDER — DEXTROSE 50 % IV SOLN
1.0000 | Freq: Once | INTRAVENOUS | Status: AC
  Administered 2018-02-20: 50 mL via INTRAVENOUS
  Filled 2018-02-20: qty 50

## 2018-02-20 MED ORDER — DEXTROSE 50 % IV SOLN
INTRAVENOUS | Status: AC
  Filled 2018-02-20: qty 50

## 2018-02-20 MED ORDER — DEXTROSE 50 % IV SOLN
INTRAVENOUS | Status: AC
  Administered 2018-02-20: 03:00:00
  Filled 2018-02-20: qty 50

## 2018-02-20 MED ORDER — HYDROCORTISONE NA SUCCINATE PF 100 MG IJ SOLR: 50.0000 mg | Freq: Three times a day (TID) | INTRAMUSCULAR | Status: DC

## 2018-02-20 MED ORDER — SODIUM CHLORIDE 0.9 % IV SOLN
2.0000 g | INTRAVENOUS | Status: DC
  Administered 2018-02-20 – 2018-02-22 (×3): 2 g via INTRAVENOUS
  Filled 2018-02-20 (×5): qty 20

## 2018-02-20 MED ORDER — PHENYLEPHRINE HCL 10 MG/ML IJ SOLN
0.0000 ug/min | INTRAMUSCULAR | Status: DC
  Administered 2018-02-20: 110 ug/min via INTRAVENOUS
  Administered 2018-02-20: 60 ug/min via INTRAVENOUS
  Administered 2018-02-20: 80 ug/min via INTRAVENOUS
  Administered 2018-02-20: 70 ug/min via INTRAVENOUS
  Administered 2018-02-20: 85 ug/min via INTRAVENOUS
  Administered 2018-02-20: 100 ug/min via INTRAVENOUS
  Administered 2018-02-20: 70 ug/min via INTRAVENOUS
  Administered 2018-02-21: 20 ug/min via INTRAVENOUS
  Administered 2018-02-21: 60 ug/min via INTRAVENOUS
  Administered 2018-02-21: 70 ug/min via INTRAVENOUS
  Filled 2018-02-20: qty 10
  Filled 2018-02-20: qty 1
  Filled 2018-02-20 (×3): qty 10
  Filled 2018-02-20: qty 1
  Filled 2018-02-20: qty 10
  Filled 2018-02-20 (×4): qty 1

## 2018-02-20 MED ORDER — HYDROCORTISONE NA SUCCINATE PF 100 MG IJ SOLR
100.0000 mg | Freq: Once | INTRAMUSCULAR | Status: AC
  Administered 2018-02-20: 100 mg via INTRAVENOUS
  Filled 2018-02-20: qty 2

## 2018-02-20 MED ORDER — INSULIN ASPART 100 UNIT/ML ~~LOC~~ SOLN
0.0000 [IU] | SUBCUTANEOUS | Status: DC
  Administered 2018-02-20 (×2): 2 [IU] via SUBCUTANEOUS
  Administered 2018-02-20: 3 [IU] via SUBCUTANEOUS
  Administered 2018-02-20: 2 [IU] via SUBCUTANEOUS
  Administered 2018-02-21: 1 [IU] via SUBCUTANEOUS
  Administered 2018-02-21: 2 [IU] via SUBCUTANEOUS
  Filled 2018-02-20 (×7): qty 1

## 2018-02-20 NOTE — Clinical Social Work Note (Signed)
Please see below most recent PSA completed on 01/30/18. Patient admitted to [facility] after hospital stay and was completing short term rehab. Per facility she was doing great and planning to DC this weekend.  Prior to coming in she became lightheaded, fell in bathroom and was readmitted.  Patient will need ST rehab continued at DC due to fall and re-hospitalized. Patient has [place] and will need re-auth at discharge, along with PT notes.  Will continue to follow while in hospital. Will update FL2 once patient is out of the ICU and more stable.  [place]  Clinical Social Work Assessment  Patient Details  Name: Betty [name] MRN: [id number] Date of Birth: 15-Apr-1946  Date of referral:  01/30/18               Reason for consult:  Facility Placement                 Permission sought to share information with:  Family Supports, Customer service manager Permission granted to share information::  Yes, Verbal Permission Granted             Name::     [name] [phone]              Agency::  SNF admissions             Relationship::                Contact Information:     Housing/Transportation Living arrangements for the past 2 months:  Single Family Home Source of Information:  Patient Patient Interpreter Needed:  None Criminal Activity/Legal Involvement Pertinent to Current Situation/Hospitalization:  No - Comment as needed Significant Relationships:  Adult Children Lives with:  Self Do you feel safe going back to the place where you live?  No Need for family participation in patient care:  No (Coment)  Care giving concerns:  Patient feels she needs short term rehab before she is able to return back home.   Social Worker assessment / plan:  Patient is a 72 year old female who lives alone, patient is alert and oriented x4.  Patient states she has been in rehab before and would like to go to [facility].  CSW spoke to [facility] and they will accept  patient once insurance has been approved.  Patient is familiar with process of looking for placement and what to expect at SNF.  Patient was talkative, and in a positive mood.  Patient gave CSW permission to begin bed search.  Patient did not have any other questions or concerns.    Employment status:  Retired Nurse, adult PT Recommendations:  [place] / Referral to community resources:  [place]  Patient/Family's Response to care:  Patient is in agreement to going to SNF for short term rehab.  Patient/Family's Understanding of and Emotional Response to Diagnosis, Current Treatment, and Prognosis: Patient is aware of current treatment plan and prognosis.  Emotional Assessment Appearance:  Appears stated age Attitude/Demeanor/Rapport:    Affect (typically observed):  Appropriate, Calm, Stable Orientation:  Oriented to Place, Oriented to Self, Oriented to  Time, Oriented to Situation Alcohol / Substance use:  Not Applicable Psych involvement (Current and /or in the community):  No (Comment)  Discharge Needs  Concerns to be addressed:    Readmission within the last 30 days:  No Current discharge risk:  Lack of support system Barriers to Discharge:  Ship broker, Continued Medical  Work up   [employer] 01/30/2018, 4:21 PM

## 2018-02-20 NOTE — Progress Notes (Signed)
Inpatient Diabetes Program Recommendations  AACE/ADA: New Consensus Statement on Inpatient Glycemic Control (2015)  Target Ranges:  Prepandial:   less than 140 mg/dL      [facility] postprandial:   less than 180 mg/dL (1-2 hours)      Critically ill patients:  140 - 180 mg/dL   Lab Results  Component Value Date   GLUCAP 199 (H) 02/20/2018   HGBA1C 7.3 (H) 07/19/2017    Review of Glycemic Control  Results for LUX, SKILTON (MRN [id number]) as of 02/20/2018 11:35  Ref. Range 02/20/2018 02:30 02/20/2018 02:38 02/20/2018 03:06 02/20/2018 07:47 02/20/2018 10:16  Glucose-Capillary Latest Ref Range: 65 - 99 mg/dL 31 (LL) 144 (H) 121 (H) 221 (H) 199 (H)    Diabetes history: Type 2 Outpatient Diabetes medications: Glucophage 1000mg  bid  Current orders for Inpatient glycemic control: Novolog 0-9 units q4h   Inpatient Diabetes Program Recommendations: Recent episode of hypoglycemia with no medications given prior (while she was at the hospital.)   Agree with current orders for blood sugar management.  Gentry Fitz, RN, BA, MHA, CDE Diabetes Coordinator Inpatient Diabetes Program  [phone] (Team Pager) [phone] ([place]) 02/20/2018 11:43 AM

## 2018-02-20 NOTE — Consult Note (Signed)
Name: Betty [name] MRN: [id number] DOB: March 17, 1946    ADMISSION DATE:  02/19/2018 CONSULTATION DATE: 02/19/2018  REFERRING MD : Dr. Henry Russel   CHIEF COMPLAINT: Hematuria   BRIEF PATIENT DESCRIPTION:  72 yo female admitted with C2 cervical fracture transferred to stepdown unit with hypotension and acute encephalopathy likely secondary to hypercapnia requiring Bipap   SIGNIFICANT EVENTS/STUDIES   02/28-Pt admitted to [facility] unit following fall at home CT Cervical Spine revealed acute fracture of C1/C2 Neurosurgery consulted CT Head showed no acute intracranial abnormality and left scalp hematoma with no underlying skull fracture  03/1-Pt transferred to stepdown unit with acute encephalopathy requiring Bipap and hypotension   HISTORY OF PRESENT ILLNESS:   This is a 72 yo female with a PMH of Chronic Diastolic Heart Failure, Vasculitis, Obesity, HTN, Nontoxic Uninodular Goiter, GERD, COPD on chronic O2 @2L , Atrial Fibrillation, Asthma, and Allergies.  She presented to [facility] ER from nursing facility 02/28 following a fall and striking her head.  CT Cervical Spine revealed acute fracture of C1/C2 and CT Head revealed no acute intracranial abnormality and left scalp hematoma with no underlying skull fracture.  Therefore, neurosurgery consulted recommending CTA or MRA to rule out vertebral artery injury and continue brace.  She was subsequently admitted to the [facility] unit by hospitalist team for further workup and treatment.  On 03/1 she required transfer to stepdown unit due to development of acute hypoxic hypercapnic respiratory failure and acute encephalopathy requiring Bipap after receiving morphine and trazodone.  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  has a past surgical history that includes Oophorectomy; Cholecystectomy; Abdominal hysterectomy (1979); Ablation (2011 & 2014); Interstim Placement (2012); bladder botox (01/26/2018); and Cystoscopy with fulgeration (N/A, 01/28/2018). Prior to Admission medications   Medication Sig Start Date End Date Taking? Authorizing Provider  acetaminophen (TYLENOL) 325 MG tablet Take 2 tablets (650 mg total) by mouth every 6 (six) hours as needed for mild pain (or Fever >/= 101). 02/04/18  Yes Gladstone Lighter, MD  albuterol (PROVENTIL HFA;VENTOLIN HFA) 108 (90 Base) MCG/ACT inhaler Inhale 2 puffs into the lungs every 4 (four) hours as needed for wheezing or shortness of breath. 01/17/16  Yes Kasa, Maretta Bees, MD  albuterol (PROVENTIL) (2.5 MG/3ML) 0.083% nebulizer solution Take 3 mLs (2.5 mg total) by nebulization every 4 (four) hours as needed for wheezing or shortness of breath. 06/28/16  Yes Gladstone Lighter, MD  atorvastatin (LIPITOR) 20 MG tablet Take 20 mg by mouth daily.   Yes [provider]  azaTHIOprine (IMURAN) 50 MG tablet Take 100 mg by mouth daily.    Yes [provider]  calcium-vitamin D (OSCAL WITH D) 500-200 MG-UNIT per tablet Take 1 tablet by mouth daily.   Yes [provider]  diltiazem (CARDIZEM CD) 180 MG 24 hr capsule Take 180 mg by mouth daily. 06/23/17  Yes [provider]  diphenhydrAMINE (BENADRYL) 25 mg capsule Take 25 mg by mouth every 6 (six) hours as needed.   Yes [provider]  ferrous sulfate 325 (65 FE) MG EC tablet Take 325 mg by mouth daily.  06/23/17  Yes [provider]    fluticasone (FLONASE) 50 MCG/ACT nasal spray Place 2 sprays into the nose daily.  06/03/17 06/03/18 Yes [provider]  gabapentin (NEURONTIN) 600 MG tablet Take 600 mg by mouth 2 (two) times daily.   Yes [provider]  guaiFENesin-dextromethorphan (ROBITUSSIN DM) 100-10 MG/5ML syrup Take 5 mLs by mouth every 4 (four) hours as needed for cough. 02/04/18  Yes Gladstone Lighter, MD  Lancets 30G MISC Use 1 Units as directed. Check CBG's fasting once daily. Dx: E11.9 07/26/15  Yes [provider]  loratadine (CLARITIN) 10 MG tablet Take 10 mg by mouth daily. 06/03/17  Yes [provider]  magic mouthwash SOLN Take 10 mLs by mouth every 6 (six) hours as needed for mouth pain.   Yes [provider]  magnesium oxide (MAG-OX) 400 (241.3 Mg) MG tablet TAKE 1 TABLET (400 MG TOTAL) BY MOUTH ONCE DAILY. 06/04/17  Yes [provider]  Melatonin 5 MG TABS Take 5 mg by mouth at bedtime as needed.   Yes [provider]  metFORMIN (GLUCOPHAGE) 1000 MG tablet Take 1,000 mg by mouth 2 (two) times daily with a meal.    Yes [provider]  metolazone (ZAROXOLYN) 2.5 MG tablet Take 2.5 mg by mouth 2 (two) times a week. Take 2.5 mg by mouth on Monday and Thursday.   Yes [provider]  metoprolol succinate (TOPROL-XL) 25 MG 24 hr tablet Take 25 mg by mouth 2 (two) times daily.    Yes [provider]  opium-belladonna (B&O SUPPRETTES) 16.2-60 MG suppository Place 1 suppository rectally every 6 (six) hours as needed for bladder spasms. 02/04/18  Yes Gladstone Lighter, MD  oxybutynin (DITROPAN) 5 MG tablet Take 5 mg by mouth 3 (three) times daily.    Yes [provider]  oxyCODONE (OXY IR/ROXICODONE) 5 MG immediate release tablet Take 5 mg by mouth every 4 (four) hours as needed.   Yes [provider]  pantoprazole (PROTONIX) 40 MG tablet Take 40 mg by mouth daily.   Yes [provider]  potassium chloride SA  (K-DUR,KLOR-CON) 20 MEQ tablet Take 1 tablet (20 mEq total) by mouth 2 (two) times daily. Patient taking differently: Take 40 mEq by mouth 2 (two) times daily.  02/04/18  Yes Gladstone Lighter, MD  propafenone (RYTHMOL) 225 MG tablet Take 225 mg by mouth every 12 (twelve) hours.   Yes [provider]  rOPINIRole (REQUIP) 4 MG tablet Take 4 mg by mouth at bedtime. 07/02/17  Yes [provider]  senna-docusate (SENNA-PLUS) 8.6-50 MG tablet Take 2 tablets by mouth 2 (two) times daily.    Yes [provider]  spironolactone (ALDACTONE) 25 MG tablet Take 25 mg by mouth daily.   Yes [provider]  torsemide (DEMADEX) 20 MG tablet Take 80 mg by mouth 2 (two) times daily.    Yes [provider]  venlafaxine (EFFEXOR) 75 MG tablet Take 75 mg by mouth 3 (three) times daily. 06/24/17  Yes [provider]  Vitamin D, Ergocalciferol, (DRISDOL) 50000 units CAPS capsule Take 50,000 Units by mouth every 7 (seven) days. Mondays only   Yes [provider]  enoxaparin (LOVENOX) 100 MG/ML injection Inject 0.95 mLs (95 mg total) into the skin every 12 (twelve) hours. X 3-4 days until INR reaches goal of 2.5 Patient not taking: Reported on 02/19/2018 02/04/18   Gladstone Lighter, MD  traZODone (DESYREL) 150 MG tablet Take 1 tablet (150 mg total) by mouth at  bedtime as needed for sleep. Patient not taking: Reported on 02/19/2018 02/04/18   Gladstone Lighter, MD  warfarin (COUMADIN) 6 MG tablet Take 6 mg by mouth daily at 6 PM.  07/09/17 07/09/18  [provider]   Allergies  Allergen Reactions  . Flecainide Shortness Of Breath and Other (See Comments)    Reaction: dizziness   . Amiodarone Other (See Comments)    Pt states that this medication causes lung bleeding.      FAMILY HISTORY:  family history includes Arthritis in her father, maternal grandmother, and mother; Cancer in her maternal grandmother; Colon cancer in her mother; Coronary artery  disease in her brother; Heart attack in her father; Heart failure in her father; Hypertension in her father and mother; Peripheral vascular disease in her brother; Stroke in her father. SOCIAL HISTORY:  reports that she has quit smoking. Her smoking use included cigarettes. She has a 7.50 pack-year smoking history. she has never used smokeless tobacco. She reports that she does not drink alcohol or use drugs.  REVIEW OF SYSTEMS:   Unable to assess pt lethargic   SUBJECTIVE:  Unable to assess pt lethargic   VITAL SIGNS: Temp:  [98.1 F (36.7 C)-98.5 F (36.9 C)] 98.5 F (36.9 C) (03/01 0319) Pulse Rate:  [65-74] 70 (03/01 0319) Resp:  [16-20] 20 (03/01 0319) BP: (89-136)/(43-88) 89/43 (03/01 0319) SpO2:  [91 %-100 %] 100 % (03/01 0319) Weight:  [105.2 kg (232 lb)] 105.2 kg (232 lb) (02/28 0706)  PHYSICAL EXAMINATION: General: acutely ill appearing female, NAD on Bipap  Neuro: lethargic, PERRL  HEENT: neck brace in place, no JVD  Cardiovascular: paced rhythm, no M/R/G Lungs: diminished throughout, even, non labored  Abdomen: +BS x4, obese, soft, non tender, non distended  Musculoskeletal: normal bulk and tone, no edema  Skin: intact no rashes or lesions   Recent Labs  Lab 02/19/18 0709  NA 129*  K 5.0  CL 88*  CO2 26  BUN 47*  CREATININE 1.91*  GLUCOSE 118*   Recent Labs  Lab 02/19/18 0709  HGB 9.9*  HCT 31.4*  WBC 12.6*  PLT 472*   Dg Chest 1 View  Result Date: 02/19/2018 CLINICAL DATA:  Hypoxia, COPD EXAM: CHEST 1 VIEW COMPARISON:  01/23/2018 FINDINGS: LEFT-sided pacemaker overlies stable enlarged cardiac silhouette. Aortic valve noted. Low lung volumes. Chronic RIGHT effusion unchanged. Mild central venous congestion pattern. IMPRESSION: 1. No significant change. 2. Chronic RIGHT effusion. 3. Mild central venous congestion pattern. Electronically Signed   By: Suzy Bouchard M.D.   On: 02/19/2018 12:36   Ct Head Wo Contrast  Result Date: 02/19/2018 CLINICAL  DATA:  72 year old female status post unwitnessed fall in bathroom. Forehead lacerations. Head neck and back pain EXAM: CT HEAD WITHOUT CONTRAST CT CERVICAL SPINE WITHOUT CONTRAST TECHNIQUE: Multidetector CT imaging of the head and cervical spine was performed following the standard protocol without intravenous contrast. Multiplanar CT image reconstructions of the cervical spine were also generated. COMPARISON:  CT head and face 07/11/2017. CT head and cervical spine 06/25/2016. FINDINGS: CT HEAD FINDINGS Brain: Chronic right caudate lacunar infarct. Mild for age cerebral white matter hypodensity. Stable gray-white matter differentiation throughout the brain. Cerebral volume remains normal. No midline shift, ventriculomegaly, mass effect, evidence of mass lesion, intracranial hemorrhage or evidence of cortically based acute infarction. No cortical encephalomalacia identified. Vascular: Calcified atherosclerosis at the skull base. No suspicious intracranial vascular hyperdensity. Skull: Stable. Hyperostosis (normal variant). No skull fracture identified. Sinuses/Orbits: Stable chronic paranasal sinus mucoperiosteal thickening  since 2017. Tympanic cavities and mastoids remain clear. Other: Left forehead scalp hematoma measuring up to 7 millimeters in thickness. No soft tissue gas identified. Underlying left frontal bone intact. Other scalp and orbits soft tissues are within normal limits. Negative visible deep soft tissue spaces of the face. CT CERVICAL SPINE FINDINGS Study is intermittently degraded by motion artifact despite repeated imaging attempts. Skull base and vertebrae: The skull base appears intact. There are nondisplaced bilateral lateral C1 ring fractures. That on the left side is also visible on the lowest head CT image (series 4, image 2). Congenital incomplete ossification of the posterior C1 ring. C1-occipital alignment remains normal. Superimposed hangman's type C2 fracture. See series 10 images 21  through 35. The fracture extends obliquely through the posterior C2 body and posterior inferior C2 endplate. On both sides the fracture propagates through of the foramen transversarium. This fracture is minimally displaced. The odontoid and atlanto dens interval remain normal. The C3 and lower cervical vertebrae appear stable and intact. No other cervical spine fracture identified. Alignment: Bilateral cervical posterior element alignment is stable since 2017. stable chronic chronic mild retrolisthesis of C5 on C6 and C6 on C7. Soft tissues and spinal canal: No prevertebral fluid or swelling. No visible canal hematoma. Disc levels: Chronic lower cervical spine degenerative spinal stenosis related to mild spondylolisthesis plus chronic disc and endplate degeneration at C5-C6 and C6-C7. There is chronic severe right side facet degeneration at C4-C5. Upper chest: The visible upper thoracic levels appear intact. Stable lung apices. Negative noncontrast thoracic inlet. IMPRESSION: 1. Positive for acute cervical spine fractures: - Hangman's fracture of C2. This fracture involves the bilateral transverse foramen, the posterior C2 body, and posterior inferior C2 endplate. This fracture is minimally displaced. - bilateral, lateral C1 ring fractures. These fractures are largely nondisplaced. 2. The remaining cervical spine appears stable and intact. Chronic lower cervical degenerative spinal stenosis. 3. No acute intracranial abnormality. Stable non contrast CT appearance of the brain. 4. Left scalp hematoma with no underlying skull fracture. Critical Value/emergent results were called by telephone at the time of interpretation on 02/19/2018 at 0747 hours to Dr. Reita Cliche who verbally acknowledged these results. Electronically Signed   By: Genevie Ann M.D.   On: 02/19/2018 07:52   Ct Cervical Spine Wo Contrast  Result Date: 02/19/2018 CLINICAL DATA:  72 year old female status post unwitnessed fall in bathroom. Forehead lacerations.  Head neck and back pain EXAM: CT HEAD WITHOUT CONTRAST CT CERVICAL SPINE WITHOUT CONTRAST TECHNIQUE: Multidetector CT imaging of the head and cervical spine was performed following the standard protocol without intravenous contrast. Multiplanar CT image reconstructions of the cervical spine were also generated. COMPARISON:  CT head and face 07/11/2017. CT head and cervical spine 06/25/2016. FINDINGS: CT HEAD FINDINGS Brain: Chronic right caudate lacunar infarct. Mild for age cerebral white matter hypodensity. Stable gray-white matter differentiation throughout the brain. Cerebral volume remains normal. No midline shift, ventriculomegaly, mass effect, evidence of mass lesion, intracranial hemorrhage or evidence of cortically based acute infarction. No cortical encephalomalacia identified. Vascular: Calcified atherosclerosis at the skull base. No suspicious intracranial vascular hyperdensity. Skull: Stable. Hyperostosis (normal variant). No skull fracture identified. Sinuses/Orbits: Stable chronic paranasal sinus mucoperiosteal thickening since 2017. Tympanic cavities and mastoids remain clear. Other: Left forehead scalp hematoma measuring up to 7 millimeters in thickness. No soft tissue gas identified. Underlying left frontal bone intact. Other scalp and orbits soft tissues are within normal limits. Negative visible deep soft tissue spaces of the face. CT CERVICAL SPINE FINDINGS  Study is intermittently degraded by motion artifact despite repeated imaging attempts. Skull base and vertebrae: The skull base appears intact. There are nondisplaced bilateral lateral C1 ring fractures. That on the left side is also visible on the lowest head CT image (series 4, image 2). Congenital incomplete ossification of the posterior C1 ring. C1-occipital alignment remains normal. Superimposed hangman's type C2 fracture. See series 10 images 21 through 35. The fracture extends obliquely through the posterior C2 body and posterior  inferior C2 endplate. On both sides the fracture propagates through of the foramen transversarium. This fracture is minimally displaced. The odontoid and atlanto dens interval remain normal. The C3 and lower cervical vertebrae appear stable and intact. No other cervical spine fracture identified. Alignment: Bilateral cervical posterior element alignment is stable since 2017. stable chronic chronic mild retrolisthesis of C5 on C6 and C6 on C7. Soft tissues and spinal canal: No prevertebral fluid or swelling. No visible canal hematoma. Disc levels: Chronic lower cervical spine degenerative spinal stenosis related to mild spondylolisthesis plus chronic disc and endplate degeneration at C5-C6 and C6-C7. There is chronic severe right side facet degeneration at C4-C5. Upper chest: The visible upper thoracic levels appear intact. Stable lung apices. Negative noncontrast thoracic inlet. IMPRESSION: 1. Positive for acute cervical spine fractures: - Hangman's fracture of C2. This fracture involves the bilateral transverse foramen, the posterior C2 body, and posterior inferior C2 endplate. This fracture is minimally displaced. - bilateral, lateral C1 ring fractures. These fractures are largely nondisplaced. 2. The remaining cervical spine appears stable and intact. Chronic lower cervical degenerative spinal stenosis. 3. No acute intracranial abnormality. Stable non contrast CT appearance of the brain. 4. Left scalp hematoma with no underlying skull fracture. Critical Value/emergent results were called by telephone at the time of interpretation on 02/19/2018 at 0747 hours to Dr. Reita Cliche who verbally acknowledged these results. Electronically Signed   By: Genevie Ann M.D.   On: 02/19/2018 07:52    ASSESSMENT / PLAN: Acute C1/C2 Fracture and Left Scalp Hematoma post fall  Acute hypoxic hypercapnic respiratory failure secondary to sedating medication  Acute encephalopathy Hypotension   Acute renal failure  Hyperkalemia  Anemia  without obvious acute blood loss Hx: COPD, Asthma, Atrial Fibrillation  P: Prn Bipap for hypercapnia, dyspnea, and/or hypoxia Prn CXR  Prn bronchodilator therapy  Continuous telemetry monitoring  Prn neo-synephrine to maintain map >65 Hold outpatient cardizem  Trend BMP Replace electrolytes as indicated  Monitor UOP SCD's for VTE prophylaxis  Currently holding coumadin due to elevated coags  Trend CBC and coags Monitor for s/sx of bleeding and transfuse for hgb <7 Neurosurgery consulted appreciate input recommending CTA or MRA to r/o vertebral artery injury (however unable to obtain at this time due to acute renal failure), continue neck brace, follow-up in outpatient clinic in 2-4 weeks, not a great surgical candidate can likely be treated successfully with neck brace  Marda Stalker, [place] Pager [phone] (please enter 7 digits) PCCM Consult Pager [phone] (please enter 7 digits)

## 2018-02-20 NOTE — Progress Notes (Signed)
Patient family was called about change of status but phone went to sons voicemail and it was full

## 2018-02-20 NOTE — Progress Notes (Signed)
Was called into patient room while nurse tech was replacing tele monitor leads back on patient. Patient was pale and cool to the tough. Patient wasn't responding when name was called. Capillary blood glucose taken reading 31. Vitals and oxygen dropped. Dextrose was given. Rapid Response called. Patient was transported to ICU.

## 2018-02-20 NOTE — Progress Notes (Signed)
Rapid response called for AMS, glucose 31, SBP 88, transferred to ICU for further care, intensivitst aware.

## 2018-02-20 NOTE — Plan of Care (Deleted)
  Not Applicable Clinical Measurements: Ability to maintain clinical measurements within normal limits will improve 03/30/8456 3344 - Not Applicable by Zettie Pho, RN Diagnostic test results will improve 07/25/158 9689 - Not Applicable by Zettie Pho, RN Cardiovascular complication will be avoided 04/29/219 2669 - Not Applicable by Farris Has D, RN Activity: Risk for activity intolerance will decrease 12/29/7559 2548 - Not Applicable by Zettie Pho, RN Nutrition: Adequate nutrition will be maintained 02/22/3467 8737 - Not Applicable by Zettie Pho, RN   Completed/Met Clinical Measurements: Will remain free from infection 02/20/2018 1423 - Completed/Met by Farris Has D, RN Coping: Level of anxiety will decrease 02/20/2018 1423 - Completed/Met by Farris Has D, RN Safety: Ability to remain free from injury will improve 02/20/2018 1423 - Completed/Met by Farris Has D, RN   Progressing Pain Management: Satisfaction with pain management regimen will improve 02/20/2018 1423 - Progressing by Zettie Pho, RN

## 2018-02-20 NOTE — Progress Notes (Signed)
   02/20/18 0231  Clinical Encounter Type  Visited With Patient not available;Health care provider  Visit Type Code (rapid response)   Chaplain received rapid response page to [facility].  Maintained pastoral presence and offered silent prayers for patient and care team.  Chaplain accompanied patient and staff as patient was transferred to CCU, room 1.  Chaplain maintained pastoral presence and checked in with unit staff.

## 2018-02-20 NOTE — Progress Notes (Signed)
PT Cancellation Note  Patient Details Name: Betty [name] MRN: [id number] DOB: [birth year]   Cancelled Treatment:    Reason Eval/Treat Not Completed: Other (comment). Consult received and chart reviewed. Pt currently with elevated INR at 5.01 and K+ at 5.4. Pt contraindicated for therapy at this time. Will hold treatment today and re-assess tomorrow if medically stable.   Kalayna Noy 02/20/2018, 8:56 AM  Greggory Stallion, PT, DPT [phone]

## 2018-02-20 NOTE — Progress Notes (Signed)
Responded to rapid response. ABG attempted per verbal of NP. Not able to obtain however, another RT retrieved sample.

## 2018-02-20 NOTE — Progress Notes (Signed)
[place] [facility] [place] NAME: Betty [name]    MR#:  [id number]  DATE OF BIRTH:  [birth year]  SUBJECTIVE:    Patient transferred to stepdown unit due to acute encephalopathy and hypotension. She is now on BiPAP. According to the nurse patient received trazodone and morphine and then subsequently was lethargic and unarousable. Rapid response was called. Patient's currently on BiPAP not falling commands currently REVIEW OF SYSTEMS:    Unable to obtain due to encephalopathy   Tolerating Diet: npo      DRUG ALLERGIES:   Allergies  Allergen Reactions  . Flecainide Shortness Of Breath and Other (See Comments)    Reaction: dizziness   . Amiodarone Other (See Comments)    Pt states that this medication causes lung bleeding.      VITALS:  Blood pressure (!) 106/58, pulse 70, temperature 98.5 F (36.9 C), temperature source Oral, resp. rate (!) 22, height 5\' 9"  (1.753 m), weight 105.2 kg (232 lb), SpO2 100 %.  PHYSICAL EXAMINATION:  Constitutional: Patient appears well-developed wearing cervical collar she has BiPAP on she seems lethargic  HENT: Normocephalic. Marland Kitchen Oropharynx is clear and moist.  Eyes: PERRLA, no scleral icterus.  Neck: Cervical collar placed  CVS: RRR, S1/S2 +, no murmurs, no gallops, no carotid bruit.  Pulmonary: Effort and breath sounds normal, no stridor, rhonchi, wheezes, rales.  Abdominal: Soft. BS +,  no distension, tenderness, rebound or guarding.  Musculoskeletal: Normal range of motion. No edema and no tenderness.  Neuro: Lethargic Skin: Skin is warm and dry. No rash noted. Psychiatric: Lethargic   LABORATORY PANEL:   CBC Recent Labs  Lab 02/20/18 0446  WBC 21.5*  HGB 9.5*  HCT 29.6*  PLT 422   ------------------------------------------------------------------------------------------------------------------  Chemistries  Recent Labs  Lab 02/20/18 0446  NA 131*  K 5.4*  CL 94*  CO2 22  GLUCOSE 158*   BUN 50*  CREATININE 2.03*  CALCIUM 8.0*   ------------------------------------------------------------------------------------------------------------------  Cardiac Enzymes Recent Labs  Lab 02/19/18 0709  TROPONINI <0.03   ------------------------------------------------------------------------------------------------------------------  RADIOLOGY:  Dg Chest 1 View  Result Date: 02/19/2018 CLINICAL DATA:  Hypoxia, COPD EXAM: CHEST 1 VIEW COMPARISON:  01/23/2018 FINDINGS: LEFT-sided pacemaker overlies stable enlarged cardiac silhouette. Aortic valve noted. Low lung volumes. Chronic RIGHT effusion unchanged. Mild central venous congestion pattern. IMPRESSION: 1. No significant change. 2. Chronic RIGHT effusion. 3. Mild central venous congestion pattern. Electronically Signed   By: Suzy Bouchard M.D.   On: 02/19/2018 12:36   Ct Head Wo Contrast  Result Date: 02/19/2018 CLINICAL DATA:  72 year old female status post unwitnessed fall in bathroom. Forehead lacerations. Head neck and back pain EXAM: CT HEAD WITHOUT CONTRAST CT CERVICAL SPINE WITHOUT CONTRAST TECHNIQUE: Multidetector CT imaging of the head and cervical spine was performed following the standard protocol without intravenous contrast. Multiplanar CT image reconstructions of the cervical spine were also generated. COMPARISON:  CT head and face 07/11/2017. CT head and cervical spine 06/25/2016. FINDINGS: CT HEAD FINDINGS Brain: Chronic right caudate lacunar infarct. Mild for age cerebral white matter hypodensity. Stable gray-white matter differentiation throughout the brain. Cerebral volume remains normal. No midline shift, ventriculomegaly, mass effect, evidence of mass lesion, intracranial hemorrhage or evidence of cortically based acute infarction. No cortical encephalomalacia identified. Vascular: Calcified atherosclerosis at the skull base. No suspicious intracranial vascular hyperdensity. Skull: Stable. Hyperostosis (normal  variant). No skull fracture identified. Sinuses/Orbits: Stable chronic paranasal sinus mucoperiosteal thickening since 2017. Tympanic cavities and mastoids remain clear. Other:  Left forehead scalp hematoma measuring up to 7 millimeters in thickness. No soft tissue gas identified. Underlying left frontal bone intact. Other scalp and orbits soft tissues are within normal limits. Negative visible deep soft tissue spaces of the face. CT CERVICAL SPINE FINDINGS Study is intermittently degraded by motion artifact despite repeated imaging attempts. Skull base and vertebrae: The skull base appears intact. There are nondisplaced bilateral lateral C1 ring fractures. That on the left side is also visible on the lowest head CT image (series 4, image 2). Congenital incomplete ossification of the posterior C1 ring. C1-occipital alignment remains normal. Superimposed hangman's type C2 fracture. See series 10 images 21 through 35. The fracture extends obliquely through the posterior C2 body and posterior inferior C2 endplate. On both sides the fracture propagates through of the foramen transversarium. This fracture is minimally displaced. The odontoid and atlanto dens interval remain normal. The C3 and lower cervical vertebrae appear stable and intact. No other cervical spine fracture identified. Alignment: Bilateral cervical posterior element alignment is stable since 2017. stable chronic chronic mild retrolisthesis of C5 on C6 and C6 on C7. Soft tissues and spinal canal: No prevertebral fluid or swelling. No visible canal hematoma. Disc levels: Chronic lower cervical spine degenerative spinal stenosis related to mild spondylolisthesis plus chronic disc and endplate degeneration at C5-C6 and C6-C7. There is chronic severe right side facet degeneration at C4-C5. Upper chest: The visible upper thoracic levels appear intact. Stable lung apices. Negative noncontrast thoracic inlet. IMPRESSION: 1. Positive for acute cervical spine  fractures: - Hangman's fracture of C2. This fracture involves the bilateral transverse foramen, the posterior C2 body, and posterior inferior C2 endplate. This fracture is minimally displaced. - bilateral, lateral C1 ring fractures. These fractures are largely nondisplaced. 2. The remaining cervical spine appears stable and intact. Chronic lower cervical degenerative spinal stenosis. 3. No acute intracranial abnormality. Stable non contrast CT appearance of the brain. 4. Left scalp hematoma with no underlying skull fracture. Critical Value/emergent results were called by telephone at the time of interpretation on 02/19/2018 at 0747 hours to Dr. Reita Cliche who verbally acknowledged these results. Electronically Signed   By: Genevie Ann M.D.   On: 02/19/2018 07:52   Ct Cervical Spine Wo Contrast  Result Date: 02/19/2018 CLINICAL DATA:  72 year old female status post unwitnessed fall in bathroom. Forehead lacerations. Head neck and back pain EXAM: CT HEAD WITHOUT CONTRAST CT CERVICAL SPINE WITHOUT CONTRAST TECHNIQUE: Multidetector CT imaging of the head and cervical spine was performed following the standard protocol without intravenous contrast. Multiplanar CT image reconstructions of the cervical spine were also generated. COMPARISON:  CT head and face 07/11/2017. CT head and cervical spine 06/25/2016. FINDINGS: CT HEAD FINDINGS Brain: Chronic right caudate lacunar infarct. Mild for age cerebral white matter hypodensity. Stable gray-white matter differentiation throughout the brain. Cerebral volume remains normal. No midline shift, ventriculomegaly, mass effect, evidence of mass lesion, intracranial hemorrhage or evidence of cortically based acute infarction. No cortical encephalomalacia identified. Vascular: Calcified atherosclerosis at the skull base. No suspicious intracranial vascular hyperdensity. Skull: Stable. Hyperostosis (normal variant). No skull fracture identified. Sinuses/Orbits: Stable chronic paranasal sinus  mucoperiosteal thickening since 2017. Tympanic cavities and mastoids remain clear. Other: Left forehead scalp hematoma measuring up to 7 millimeters in thickness. No soft tissue gas identified. Underlying left frontal bone intact. Other scalp and orbits soft tissues are within normal limits. Negative visible deep soft tissue spaces of the face. CT CERVICAL SPINE FINDINGS Study is intermittently degraded by motion artifact despite repeated  imaging attempts. Skull base and vertebrae: The skull base appears intact. There are nondisplaced bilateral lateral C1 ring fractures. That on the left side is also visible on the lowest head CT image (series 4, image 2). Congenital incomplete ossification of the posterior C1 ring. C1-occipital alignment remains normal. Superimposed hangman's type C2 fracture. See series 10 images 21 through 35. The fracture extends obliquely through the posterior C2 body and posterior inferior C2 endplate. On both sides the fracture propagates through of the foramen transversarium. This fracture is minimally displaced. The odontoid and atlanto dens interval remain normal. The C3 and lower cervical vertebrae appear stable and intact. No other cervical spine fracture identified. Alignment: Bilateral cervical posterior element alignment is stable since 2017. stable chronic chronic mild retrolisthesis of C5 on C6 and C6 on C7. Soft tissues and spinal canal: No prevertebral fluid or swelling. No visible canal hematoma. Disc levels: Chronic lower cervical spine degenerative spinal stenosis related to mild spondylolisthesis plus chronic disc and endplate degeneration at C5-C6 and C6-C7. There is chronic severe right side facet degeneration at C4-C5. Upper chest: The visible upper thoracic levels appear intact. Stable lung apices. Negative noncontrast thoracic inlet. IMPRESSION: 1. Positive for acute cervical spine fractures: - Hangman's fracture of C2. This fracture involves the bilateral transverse  foramen, the posterior C2 body, and posterior inferior C2 endplate. This fracture is minimally displaced. - bilateral, lateral C1 ring fractures. These fractures are largely nondisplaced. 2. The remaining cervical spine appears stable and intact. Chronic lower cervical degenerative spinal stenosis. 3. No acute intracranial abnormality. Stable non contrast CT appearance of the brain. 4. Left scalp hematoma with no underlying skull fracture. Critical Value/emergent results were called by telephone at the time of interpretation on 02/19/2018 at 0747 hours to Dr. Reita Cliche who verbally acknowledged these results. Electronically Signed   By: Genevie Ann M.D.   On: 02/19/2018 07:52     ASSESSMENT AND PLAN:   72 year old female status post mechanical fall who suffered C1/C2 fracture and now in the ICU for acute encephalopathy with hypotension  1. Acute hypoxic/hypercapnic respiratory failure due to sedating medications Wean BiPAP to nasal cannula as tolerated  2. Acute encephalopathy in the setting of problem #1 and pain medications Continue to monitor  3. Hypotension: Wean off of neo  4. C1/C2 fracture after mechanical fall: Neurosurgery is recommending inpatient CTA to rule out vertebral artery injury Creatinine still elevated Patient unstable this time Continue neck brace Patient will need this neck brace for 3 months She will follow up with neurosurgery in 2-4 weeks.  5. Acute kidney injury: Creatinine still elevated Continue IV fluids Hold nephrotoxic medications  6. Hyperkalemia: This should be resolved with insulin and dextrose  7. Leukocytosis of unclear etiology Follow CBC UA and chest x-ray do not show evidence of pneumonia  8. Vasculitis: Patient was on Imuran  9. Diabetes: Sliding scale insulin  10. PAF: INR is elevated Discussed with neurosurgery who recommends restarting Coumadin whenever possible.        CODE STATUS: FULL  TOTAL TIME TAKING CARE OF THIS PATIENT: 30  minutes.     POSSIBLE D/C 2-4 days, DEPENDING ON CLINICAL CONDITION.   Deeana Atwater M.D on 02/20/2018 at 8:44 AM  Between 7am to 6pm - Pager - [phone] After 6pm go to [email or web address] - password [email or web address] [facility]  Office  [phone]  CC: Primary care physician; Dion Body, MD  Note: This dictation was prepared with Dragon dictation along with smaller phrase technology.  Any transcriptional errors that result from this process are unintentional.

## 2018-02-20 NOTE — Progress Notes (Signed)
Pharmacy Antibiotic Note  Betty [name] is a 72 y.o. female admitted on 02/19/2018 with UTI.  Pharmacy has been consulted for ceftriaxone dosing.  Plan: Will start ceftriaxone 2g IV daily  Height: 5\' 9"  (175.3 cm) Weight: 232 lb (105.2 kg) IBW/kg (Calculated) : 66.2  Temp (24hrs), Avg:98.2 F (36.8 C), Min:97.7 F (36.5 C), Max:98.5 F (36.9 C)  Recent Labs  Lab 02/19/18 0709 02/20/18 0446  WBC 12.6* 21.5*  CREATININE 1.91* 2.03*    Estimated Creatinine Clearance: 32.8 mL/min (A) (by C-G formula based on SCr of 2.03 mg/dL (H)).    Allergies  Allergen Reactions  . Flecainide Shortness Of Breath and Other (See Comments)    Reaction: dizziness   . Amiodarone Other (See Comments)    Pt states that this medication causes lung bleeding.      Thank you for allowing pharmacy to be a part of this patient's care.  Tobie Lords, PharmD, BCPS Clinical Pharmacist 02/20/2018

## 2018-02-21 ENCOUNTER — Inpatient Hospital Stay: Payer: Medicare Other

## 2018-02-21 LAB — PROCALCITONIN: Procalcitonin: 0.61 ng/mL

## 2018-02-21 LAB — CBC
HCT: 28 % — ABNORMAL LOW (ref 35.0–47.0)
Hemoglobin: 8.7 g/dL — ABNORMAL LOW (ref 12.0–16.0)
MCH: 25.7 pg — ABNORMAL LOW (ref 26.0–34.0)
MCHC: 31 g/dL — AB (ref 32.0–36.0)
MCV: 83.1 fL (ref 80.0–100.0)
PLATELETS: 338 10*3/uL (ref 150–440)
RBC: 3.37 MIL/uL — ABNORMAL LOW (ref 3.80–5.20)
RDW: 20 % — AB (ref 11.5–14.5)
WBC: 16.4 10*3/uL — ABNORMAL HIGH (ref 3.6–11.0)

## 2018-02-21 LAB — COMPREHENSIVE METABOLIC PANEL
ALBUMIN: 3.2 g/dL — AB (ref 3.5–5.0)
ALT: 319 U/L — ABNORMAL HIGH (ref 14–54)
ANION GAP: 10 (ref 5–15)
AST: 690 U/L — ABNORMAL HIGH (ref 15–41)
Alkaline Phosphatase: 101 U/L (ref 38–126)
BILIRUBIN TOTAL: 1.3 mg/dL — AB (ref 0.3–1.2)
BUN: 51 mg/dL — ABNORMAL HIGH (ref 6–20)
CHLORIDE: 99 mmol/L — AB (ref 101–111)
CO2: 28 mmol/L (ref 22–32)
Calcium: 8 mg/dL — ABNORMAL LOW (ref 8.9–10.3)
Creatinine, Ser: 1.28 mg/dL — ABNORMAL HIGH (ref 0.44–1.00)
GFR calc Af Amer: 48 mL/min — ABNORMAL LOW (ref >60)
GFR calc non Af Amer: 41 mL/min — ABNORMAL LOW (ref >60)
Glucose, Bld: 150 mg/dL — ABNORMAL HIGH (ref 65–99)
POTASSIUM: 4.5 mmol/L (ref 3.5–5.1)
SODIUM: 137 mmol/L (ref 135–145)
TOTAL PROTEIN: 6 g/dL — AB (ref 6.5–8.1)

## 2018-02-21 LAB — GLUCOSE, CAPILLARY
GLUCOSE-CAPILLARY: 106 mg/dL — AB (ref 65–99)
GLUCOSE-CAPILLARY: 84 mg/dL (ref 65–99)
GLUCOSE-CAPILLARY: 92 mg/dL (ref 65–99)
Glucose-Capillary: 120 mg/dL — ABNORMAL HIGH (ref 65–99)
Glucose-Capillary: 143 mg/dL — ABNORMAL HIGH (ref 65–99)
Glucose-Capillary: 149 mg/dL — ABNORMAL HIGH (ref 65–99)
Glucose-Capillary: 168 mg/dL — ABNORMAL HIGH (ref 65–99)

## 2018-02-21 LAB — PROTIME-INR
INR: 3.8
PROTHROMBIN TIME: 37.2 s — AB (ref 11.4–15.2)

## 2018-02-21 MED ORDER — DILTIAZEM LOAD VIA INFUSION
10.0000 mg | Freq: Once | INTRAVENOUS | Status: AC
  Administered 2018-02-21: 10 mg via INTRAVENOUS
  Filled 2018-02-21: qty 10

## 2018-02-21 MED ORDER — INSULIN ASPART 100 UNIT/ML ~~LOC~~ SOLN: 0.0000 [IU] | Freq: Every day | SUBCUTANEOUS | Status: DC

## 2018-02-21 MED ORDER — DEXMEDETOMIDINE HCL IN NACL 400 MCG/100ML IV SOLN
0.4000 ug/kg/h | INTRAVENOUS | Status: DC
  Administered 2018-02-21: 0.4 ug/kg/h via INTRAVENOUS
  Filled 2018-02-21: qty 100

## 2018-02-21 MED ORDER — DEXMEDETOMIDINE HCL IN NACL 400 MCG/100ML IV SOLN: 0.0000 ug/kg/h | INTRAVENOUS | Status: DC

## 2018-02-21 MED ORDER — METOPROLOL TARTRATE 5 MG/5ML IV SOLN
2.5000 mg | INTRAVENOUS | Status: DC | PRN
  Administered 2018-02-21 – 2018-02-23 (×10): 2.5 mg via INTRAVENOUS
  Filled 2018-02-21 (×9): qty 5

## 2018-02-21 MED ORDER — FUROSEMIDE 10 MG/ML IJ SOLN
20.0000 mg | Freq: Once | INTRAMUSCULAR | Status: AC
  Administered 2018-02-21: 20 mg via INTRAVENOUS
  Filled 2018-02-21: qty 2

## 2018-02-21 MED ORDER — DILTIAZEM HCL 100 MG IV SOLR
5.0000 mg/h | INTRAVENOUS | Status: DC
  Administered 2018-02-21: 5 mg/h via INTRAVENOUS
  Administered 2018-02-22 – 2018-02-28 (×24): 15 mg/h via INTRAVENOUS
  Administered 2018-03-01 (×2): 10 mg/h via INTRAVENOUS
  Filled 2018-02-21 (×27): qty 100

## 2018-02-21 MED ORDER — INSULIN ASPART 100 UNIT/ML ~~LOC~~ SOLN
0.0000 [IU] | SUBCUTANEOUS | Status: DC
  Administered 2018-02-22: 1 [IU] via SUBCUTANEOUS
  Administered 2018-02-22: 2 [IU] via SUBCUTANEOUS
  Administered 2018-02-22 (×2): 1 [IU] via SUBCUTANEOUS
  Administered 2018-02-22: 2 [IU] via SUBCUTANEOUS
  Filled 2018-02-21 (×5): qty 1

## 2018-02-21 MED ORDER — INSULIN ASPART 100 UNIT/ML ~~LOC~~ SOLN: 0.0000 [IU] | Freq: Three times a day (TID) | SUBCUTANEOUS | Status: DC

## 2018-02-21 NOTE — Progress Notes (Signed)
[facility] Progress Note Patient Name: Betty [name] DOB: [birth year] MRN: [id number]   Date of Service  02/21/2018  HPI/Events of Note  Seems stable on cam exam  eICU Interventions  D/w Dr Alva Garnet bedside MD - he will evaluate     Intervention Category Minor Interventions: Other:  Brand Males 02/21/2018, 6:49 AM

## 2018-02-21 NOTE — Progress Notes (Addendum)
Looks much better this morning.  More alert.  Comfortable on nasal cannula oxygen.  Required Precedex last night for agitated delirium but is presently off that now.  Calm but poorly oriented.  She is weaning off of phenylephrine  Vitals:   02/21/18 0900 02/21/18 0904 02/21/18 1000 02/21/18 1100  BP: 107/71  (!) 113/96 109/62  Pulse: 97  96 99  Resp: (!) 25  20 (!) 21  Temp:      TempSrc:      SpO2: 92% 92% 93% 97%  Weight:      Height:        HEENT: Abrasion and contusion on forehead, periorbital ecchymoses-not significantly changed Neck: Cervical collar in place Chest: Bilateral crackles, no wheezes Cardiac: Regular, mechanical first heart sound, no M Abdomen: Soft, bowel sounds present, nontender Extremities: Warm, no edema Neuro: Cranial nerves intact, no focal deficits, diffusely weak  BMP Latest Ref Rng & Units 02/21/2018 02/20/2018 02/19/2018  Glucose 65 - 99 mg/dL 150(H) 158(H) 118(H)  BUN 6 - 20 mg/dL 51(H) 50(H) 47(H)  Creatinine 0.44 - 1.00 mg/dL 1.28(H) 2.03(H) 1.91(H)  Sodium 135 - 145 mmol/L 137 131(L) 129(L)  Potassium 3.5 - 5.1 mmol/L 4.5 5.4(H) 5.0  Chloride 101 - 111 mmol/L 99(L) 94(L) 88(L)  CO2 22 - 32 mmol/L 28 22 26   Calcium 8.9 - 10.3 mg/dL 8.0(L) 8.0(L) 8.2(L)    CBC Latest Ref Rng & Units 02/21/2018 02/20/2018 02/19/2018  WBC 3.6 - 11.0 K/uL 16.4(H) 21.5(H) 12.6(H)  Hemoglobin 12.0 - 16.0 g/dL 8.7(L) 9.5(L) 9.9(L)  Hematocrit 35.0 - 47.0 % 28.0(L) 29.6(L) 31.4(L)  Platelets 150 - 440 K/uL 338 422 472(H)    Random serum cortisol 28  CXR: NSC diffuse bilateral symmetric interstitial prominence  IMP: Acute nondisplaced cervical spine fracture after fall Acute/chronic hypoxemic respiratory failure Chronic interstitial lung disease Hypotension, unclear etiology-improving 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  Remains poorly oriented but  much calmer  PLAN/REC: Continue supplemental oxygen Continue BiPAP as needed Try to manage off of Precedex Continue hydrocortisone as cortisol level was normal Continue to wean phenylephrine for MAP goal >65 mmHg Discontinue HCO3 infusion Advance diet-SLP evaluation requested Advance activity-PT evaluation requested She is on full dose enoxaparin for mechanical heart valve.  Once able, transition back to warfarin She will need placement in some kind of rehab/SNF facility Her baseline function is poor and goals of care should be addressed                              - She seems to be a poor candidate for mechanical ventilation or ACLS   Merton Border, MD PCCM service Mobile [phone] Pager [phone] 02/21/2018 11:27 AM   ADD: remains comfortable but increasingly confused and mildly agitated. Dexmedetomidine infusion resumed  Merton Border, MD PCCM service Mobile [phone] Pager [phone] 02/21/2018 2:43 PM

## 2018-02-21 NOTE — Evaluation (Signed)
Physical Therapy Evaluation Patient Details Name: Betty [name] MRN: [id number] DOB: [birth year] Today's Date: 02/21/2018   History of Present Illness  Sharona Rovner is a 72yo white female who comes to [facility] from [facility] after sustaining a fall in the [place], now with scalp laceration and C1/C2 fractures. Neurosurgicval recommending conservative management with [place] collar. Pt was at [facility] since early Feb 2018 after DC from [facility]. Pt was purportedly progressing well adn preparring for DC this weekend.  PMH includes ANCA associated vasculitis, asthma, atrial fibrillation, LBP, cardiomegaly, COPD, DM, diffuse pulmonary alveolar hemorrhage, esophageal reflux, HA, Herpes zoster, UTI, Htn, CHF, nontoxic uninodular goiter, obesity, OA, sleep apnea and urine incontinence.  Clinical Impression  Pt admitted with above diagnosis. Pt currently with functional limitations due to the deficits listed below (see "PT Problem List"). Upon entry, the patient is received semirecumbent in bed, no family/caregiver present. The pt is awake and agreeable to participate. No acute distress noted at this time. The pt is drowsy and oriented to self only, pleasant, conversational, and but expresses concerns over others on staff harming her, also concerned with "dolls in the room," and asking to be taken to the "other [facility]." RN staff reports patient has been with AMS. Functional mobility assessment demonstrates moderate to heavy strength impairment in trunk needing Mod-max assist physical assistance for bed mobility, however strength in transfers is minimally impaired, as is standing balance. AMB not pursed d/t AMS and low current safety awareness. Pt will benefit from skilled PT intervention to increase independence and safety with basic mobility in preparation for discharge to the venue listed below.       Follow Up Recommendations SNF    Equipment Recommendations  None recommended by PT    Recommendations for Other Services        Precautions / Restrictions Precautions Precautions: Fall Restrictions Weight Bearing Restrictions: No      Mobility  Bed Mobility Overal bed mobility: Needs Assistance Bed Mobility: Supine to Sit     Supine to sit: Max assist     General bed mobility comments: increased pain in neck, but willing to attempt  Transfers Overall transfer level: Needs assistance Equipment used: None;Rolling walker (2 wheeled);1 person hand held assist Transfers: Sit to/from Stand Sit to Stand: Min guard         General transfer comment: Strength good overall, but multipel Lines/leads, pt fixated on brief and clothes, rather than balance and safety   Ambulation/Gait Ambulation/Gait assistance: (no pursued at this time d/t AMS, hallucination, poor safety awarenss and lines/leads. )              Stairs            Wheelchair Mobility    Modified Rankin (Stroke Patients Only)       Balance Overall balance assessment: Modified Independent;History of Falls   Sitting balance-Leahy Scale: Normal     Standing balance support: During functional activity Standing balance-Leahy Scale: Good                               Pertinent Vitals/Pain Pain Assessment: 0-10 Pain Score: 1  Pain Location: throat Pain Intervention(s): Limited activity within patient's tolerance;Monitored during session    [place] expects to be discharged to:: Skilled nursing facility Living Arrangements: Other (Comment) Available Help at Discharge: Family;Available PRN/intermittently Type of Home: House Home Access: Stairs to enter Entrance Stairs-Rails: Right Entrance Stairs-Number of Steps: 1 Home  Layout: One level Home Equipment: Grab bars - tub/shower      Prior Function Level of Independence: Independent         Comments: Drives to get groceries     Hand Dominance   Dominant Hand: Left    Extremity/Trunk Assessment                 Communication   Communication: No difficulties  Cognition Arousal/Alertness: Awake/alert Behavior During Therapy: Anxious;Impulsive;Restless(pt hallucinating and paranoid, concerned about RN's plot to harm her, and asking to be taken to the 'other [facility]') Overall Cognitive Status: Impaired/Different from baseline                                        General Comments      Exercises     Assessment/Plan    PT Assessment Patient needs continued PT services  PT Problem List Decreased strength;Decreased mobility;Decreased activity tolerance;Decreased balance;Decreased knowledge of use of DME       PT Treatment Interventions DME instruction;Therapeutic activities;Gait training;Therapeutic exercise;Functional mobility training;Balance training;Patient/family education;Stair training    PT Goals (Current goals can be found in the Care Plan section)  Acute Rehab PT Goals Patient Stated Goal: improve strength to return home PT Goal Formulation: With patient Time For Goal Achievement: 03/07/18 Potential to Achieve Goals: Good    Frequency 7X/week   Barriers to discharge        Co-evaluation               AM-PAC PT "6 Clicks" Daily Activity  Outcome Measure Difficulty turning over in bed (including adjusting bedclothes, sheets and blankets)?: A Lot Difficulty moving from lying on back to sitting on the side of the bed? : Unable Difficulty sitting down on and standing up from a chair with arms (e.g., wheelchair, bedside commode, etc,.)?: A Little Help needed moving to and from a bed to chair (including a wheelchair)?: A Little Help needed walking in hospital room?: A Little Help needed climbing 3-5 steps with a railing? : A Lot 6 Click Score: 14    End of Session Equipment Utilized During Treatment: Oxygen Activity Tolerance: Other (comment)(limited by AMS, safety concerns) Patient left: in chair;with call bell/phone within reach;with nursing/sitter in  room Nurse Communication: Mobility status PT Visit Diagnosis: Unsteadiness on feet (R26.81);Muscle weakness (generalized) (M62.81)    Time: 7989-2119 PT Time Calculation (min) (ACUTE ONLY): 30 min   Charges:   PT Evaluation $PT Eval Moderate Complexity: 1 Mod     PT G Codes:        2:51 PM, 01-Mar-2018 Etta Grandchild, PT, DPT Physical Therapist - [facility]  [phone] ([place])     Buccola,Allan C 03/01/2018, 2:48 PM

## 2018-02-21 NOTE — Progress Notes (Signed)
[facility] Progress Note Patient Name: JEILANI GRUPE DOB: 01/16/1946 MRN: [id number]   Date of Service  02/21/2018  HPI/Events of Note  Intermittently agitated  eICU Interventions  precedex (on neoalready)     Intervention Category Major Interventions: Delirium, psychosis, severe agitation - evaluation and management  Brand Males 02/21/2018, 1:37 AM

## 2018-02-21 NOTE — Progress Notes (Signed)
[place] for enoxaparin dosing  Indication: atrial fibrillation, history of DVT/PE and Mitral Valve replacement  Allergies  Allergen Reactions  . Flecainide Shortness Of Breath and Other (See Comments)    Reaction: dizziness   . Amiodarone Other (See Comments)    Pt states that this medication causes lung bleeding.      Patient Measurements: Height: 5\' 9"  (175.3 cm) Weight: 232 lb (105.2 kg) IBW/kg (Calculated) : 66.2  Vital Signs: Temp: 97.7 F (36.5 C) (03/01 2000) Temp Source: Axillary (03/01 2000) BP: 101/57 (03/01 2000) Pulse Rate: 90 (03/01 2353)  Labs: Recent Labs    02/19/18 0709 02/20/18 0446  HGB 9.9* 9.5*  HCT 31.4* 29.6*  PLT 472* 422  LABPROT 42.0* 46.1*  INR 4.44* 5.01*  CREATININE 1.91* 2.03*  TROPONINI <0.03  --     Estimated Creatinine Clearance: 32.8 mL/min (A) (by C-G formula based on SCr of 2.03 mg/dL (H)).   Medical History: Past Medical History:  Diagnosis Date  . Acute diastolic heart failure ([place])   . Allergy   . ANCA-associated vasculitis ([place])   . Asthma   . Atrial fibrillation ([place])   . Backache, unspecified   . Cardiomegaly   . COPD (chronic obstructive pulmonary disease) ([place])   . Diabetes mellitus without complication ([place])   . 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 Esophageal reflux   . Headache(784.0)   . Herpes zoster without mention of complication   . Hx: UTI (urinary tract infection)   . Hypertension    heart controlled w CHF  . Nontoxic uninodular goiter   . Obesity, unspecified   . Osteoarthrosis, unspecified whether generalized or localized, unspecified site   . Unspecified sleep apnea   . Urine incontinence    hx of   Assessment: 72 y/o F with a h/o atrial fibrillation, DVT/PE and mitral valve replacement, goal INR 2.5-3.5, on warfarin admitted after fall at [facility] with C1/C2 fractures. Patient was taking warfarin 6 mg daily PTA and is admitted  with elevated INR. CT with no acute intracranial abnormality but left scalp hematoma. No surgery with plan for neck brace only per neurosurgery.    Plan:  Patient being transitioned to enoxaparin while PO medications are being held. Patients INR currently supratherapeutic. Will hold anticoagulation until INR is 2.5 or less. Will monitor INRs daily. When INR below threshold will initiate enoxaparin 105mg  SQ BID or renally adjusted equivalent.    Pharmacy will continue to monitor and adjust per consult.   Mechele Kittleson L 02/21/2018,12:07 AM

## 2018-02-21 NOTE — Progress Notes (Signed)
[place] for enoxaparin dosing  Indication: atrial fibrillation, history of DVT/PE and Mitral Valve replacement  Allergies  Allergen Reactions  . Flecainide Shortness Of Breath and Other (See Comments)    Reaction: dizziness   . Amiodarone Other (See Comments)    Pt states that this medication causes lung bleeding.      Patient Measurements: Height: 5\' 9"  (175.3 cm) Weight: 229 lb 4.5 oz (104 kg) IBW/kg (Calculated) : 66.2  Vital Signs: BP: 108/51 (03/02 0700) Pulse Rate: 94 (03/02 0700)  Labs: Recent Labs    02/19/18 0709 02/20/18 0446 02/21/18 0436  HGB 9.9* 9.5* 8.7*  HCT 31.4* 29.6* 28.0*  PLT 472* 422 338  LABPROT 42.0* 46.1* 37.2*  INR 4.44* 5.01* 3.80  CREATININE 1.91* 2.03* 1.28*  TROPONINI <0.03  --   --     Estimated Creatinine Clearance: 51.7 mL/min (A) (by C-G formula based on SCr of 1.28 mg/dL (H)).   Medical History: Past Medical History:  Diagnosis Date  . Acute diastolic heart failure ([place])   . Allergy   . ANCA-associated vasculitis ([place])   . Asthma   . Atrial fibrillation ([place])   . Backache, unspecified   . Cardiomegaly   . COPD (chronic obstructive pulmonary disease) ([place])   . Diabetes mellitus without complication ([place])   . 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 Esophageal reflux   . Headache(784.0)   . Herpes zoster without mention of complication   . Hx: UTI (urinary tract infection)   . Hypertension    heart controlled w CHF  . Nontoxic uninodular goiter   . Obesity, unspecified   . Osteoarthrosis, unspecified whether generalized or localized, unspecified site   . Unspecified sleep apnea   . Urine incontinence    hx of   Assessment: 72 y/o F with a h/o atrial fibrillation, DVT/PE and mitral valve replacement, goal INR 2.5-3.5, on warfarin admitted after fall at [facility] with C1/C2 fractures. Patient was taking warfarin 6 mg daily PTA and is admitted with elevated  INR. CT with no acute intracranial abnormality but left scalp hematoma. No surgery with plan for neck brace only per neurosurgery.   3/2 INR = 3.8   Plan:  Patient being transitioned to enoxaparin while PO medications are being held. Patients INR currently supratherapeutic. Will hold anticoagulation until INR is 2.5 or less. Will monitor INRs daily. When INR below threshold will initiate enoxaparin 105mg  SQ BID or renally adjusted equivalent.    Pharmacy will continue to monitor and adjust per consult.   Willford Rabideau K, [place] 02/21/2018,8:17 AM

## 2018-02-21 NOTE — Progress Notes (Signed)
Throughout shift Pt has become increasingly agitated by BiPap, cervical collar, and IV access. Pt disconnected Bipap tubing at 0130.  E-link MD, Chase Caller, was contacted and orders were placed for precedex gtt. Pt was trialled off bipap on nasal cannula and appeared to become more calm and less impulsive but soon began attempting to remove 02 cannula and cervical collar. Pt was then started on precedex drip and placed back on bipap. Will continue to monitor.

## 2018-02-21 NOTE — Progress Notes (Signed)
[place] at [place] NAME: Betty [name]    MR#:  [id number]  DATE OF BIRTH:  12/16/1946  SUBJECTIVE:    Patient is off BIpap and alert this am  Review of Systems  Constitutional: Negative.  Negative for chills, fever and malaise/fatigue.  HENT: Negative.  Negative for ear discharge, ear pain, hearing loss, nosebleeds and sore throat.   Eyes: Negative.  Negative for blurred vision and pain.  Respiratory: Positive for shortness of breath. Negative for cough, hemoptysis and wheezing.   Cardiovascular: Negative.  Negative for chest pain, palpitations and leg swelling.  Gastrointestinal: Negative.  Negative for abdominal pain, blood in stool, diarrhea, nausea and vomiting.  Genitourinary: Negative.  Negative for dysuria.  Musculoskeletal: Positive for falls and neck pain. Negative for back pain.  Skin: Negative.   Neurological: Negative for dizziness, tremors, speech change, focal weakness, seizures and headaches.  Endo/Heme/Allergies: Negative.  Does not bruise/bleed easily.  Psychiatric/Behavioral: Positive for memory loss. Negative for depression, hallucinations and suicidal ideas.     Tolerating Diet: yes     DRUG ALLERGIES:   Allergies  Allergen Reactions  . Flecainide Shortness Of Breath and Other (See Comments)    Reaction: dizziness   . Amiodarone Other (See Comments)    Pt states that this medication causes lung bleeding.      VITALS:  Blood pressure (!) 113/96, pulse 96, temperature 97.6 F (36.4 C), temperature source Axillary, resp. rate 20, height 5\' 9"  (1.753 m), weight 104 kg (229 lb 4.5 oz), SpO2 93 %.  PHYSICAL EXAMINATION:  Constitutional: Patient appears well-developed wearing cervical collar  HENT: Normocephalic. Marland Kitchen Oropharynx is clear and moist.  Eyes: PERRLA, no scleral icterus.  Neck: Cervical collar placed  CVS: RRR, S1/S2 +, no murmurs, no gallops, no carotid bruit.  Pulmonary: decreased breath sounds right  base  Abdominal: Soft. BS +,  no distension, tenderness, rebound or guarding.  Musculoskeletal: Normal range of motion. No edema and no tenderness.  Neuro: Following commands no focal deficit  Skin: Skin is warm and dry. No rash noted. Psychiatric: Alert   LABORATORY PANEL:   CBC Recent Labs  Lab 02/21/18 0436  WBC 16.4*  HGB 8.7*  HCT 28.0*  PLT 338   ------------------------------------------------------------------------------------------------------------------  Chemistries  Recent Labs  Lab 02/21/18 0436  NA 137  K 4.5  CL 99*  CO2 28  GLUCOSE 150*  BUN 51*  CREATININE 1.28*  CALCIUM 8.0*  AST 690*  ALT 319*  ALKPHOS 101  BILITOT 1.3*   ------------------------------------------------------------------------------------------------------------------  Cardiac Enzymes Recent Labs  Lab 02/19/18 0709  TROPONINI <0.03   ------------------------------------------------------------------------------------------------------------------  RADIOLOGY:  Dg Chest 1 View  Result Date: 02/19/2018 CLINICAL DATA:  Hypoxia, COPD EXAM: CHEST 1 VIEW COMPARISON:  01/23/2018 FINDINGS: LEFT-sided pacemaker overlies stable enlarged cardiac silhouette. Aortic valve noted. Low lung volumes. Chronic RIGHT effusion unchanged. Mild central venous congestion pattern. IMPRESSION: 1. No significant change. 2. Chronic RIGHT effusion. 3. Mild central venous congestion pattern. Electronically Signed   By: Suzy Bouchard M.D.   On: 02/19/2018 12:36   Dg Chest Port 1 View  Result Date: 02/21/2018 CLINICAL DATA:  Respiratory failure EXAM: PORTABLE CHEST 1 VIEW COMPARISON:  February 19, 2018 FINDINGS: Stable pacemaker. The heart, hila, and mediastinum are stable. No nodules or masses. Small right effusion. Continued mild edema/pulmonary venous congestion. No other changes. IMPRESSION: No interval change. Stable small right effusion. Pulmonary venous congestion/mild edema. Electronically Signed    By: Dorise Bullion  III M.D   On: 02/21/2018 07:18     ASSESSMENT AND PLAN:   72 year old female status post mechanical fall who suffered C1/C2 fracture and now in the ICU for acute encephalopathy with hypotension  1. Acute hypoxic/hypercapnic respiratory failure due to sedating medications and acute on chronic diastolic heart failure Wean oxygen to room air  2. Acute encephalopathy in the setting of problem #1 and pain medications Patient has improved  3. Hypotension: Wean off of neo  4. C1/C2 fracture after mechanical fall: Neurosurgery is recommending inpatient CTA to rule out vertebral artery injury Creatinine still elevated Patient unstable this time Continue neck brace Patient will need this neck brace for 3 months She will follow up with neurosurgery in 2-4 weeks.  5. Acute kidney injury: Creatinine has improved 6. Hyperkalemia: This has resolved 7. Leukocytosis of unclear etiology This is improving UA and chest x-ray do not show evidence of pneumonia  8. Vasculitis: Patient was on Imuran  9. Diabetes: Sliding scale insulin  10. PAF: INR is elevated   11. Acute on chronic diastolic heart failure: Lasix 20 mg IV 1  12. Elevated LFTs: This could be due to hepatic congestion from CHF Patient not complaining of abdominal pain Repeat LFTs in a.m. Consider abdominal ultrasound PT and speech consultation requested          CODE STATUS: FULL  TOTAL TIME TAKING CARE OF THIS PATIENT: 30 minutes.     POSSIBLE D/C 2-4 days, DEPENDING ON CLINICAL CONDITION.   Doloros Kwolek M.D on 02/21/2018 at 10:58 AM  Between 7am to 6pm - Pager - [phone] After 6pm go to [email or web address] - password [email or web address]  Office  [phone]  CC: Primary care physician; Dion Body, MD  Note: This dictation was prepared with Dragon dictation along with smaller phrase technology. Any transcriptional errors that result from this process are  unintentional.

## 2018-02-21 NOTE — Progress Notes (Signed)
Patient intermittently confused throughout shift with periods of delirium.  VSS. Patient transitioned to nasal canula this morning and tolerating well.  Attempted to give patient some water and she begins choking and coughing. Consult to speech therapy placed and patient will remain NPO until then.  Patient ambulated to chair by PT today and tolerated sitting in chair for 2 hours.    Around 1700, patient's heart rate elevated to 120s-130s.  Dr. Alva Garnet informed of elevated HR and EKG obtained.  EKG shows afib w/ RVR.  Orders received for PRN metoprolol.  Patient denies complaints of chest pain or shortness of breath.  Will continue to monitor.

## 2018-02-22 DIAGNOSIS — I480 Paroxysmal atrial fibrillation: Secondary | ICD-10-CM

## 2018-02-22 DIAGNOSIS — R0603 Acute respiratory distress: Secondary | ICD-10-CM

## 2018-02-22 LAB — COMPREHENSIVE METABOLIC PANEL
ALBUMIN: 3.5 g/dL (ref 3.5–5.0)
ALK PHOS: 114 U/L (ref 38–126)
ALT: 287 U/L — ABNORMAL HIGH (ref 14–54)
ANION GAP: 10 (ref 5–15)
AST: 455 U/L — AB (ref 15–41)
BILIRUBIN TOTAL: 2.2 mg/dL — AB (ref 0.3–1.2)
BUN: 27 mg/dL — AB (ref 6–20)
CALCIUM: 8.5 mg/dL — AB (ref 8.9–10.3)
CO2: 30 mmol/L (ref 22–32)
Chloride: 103 mmol/L (ref 101–111)
Creatinine, Ser: 0.68 mg/dL (ref 0.44–1.00)
GFR calc Af Amer: >60 mL/min (ref >60)
GLUCOSE: 127 mg/dL — AB (ref 65–99)
POTASSIUM: 4 mmol/L (ref 3.5–5.1)
Sodium: 143 mmol/L (ref 135–145)
TOTAL PROTEIN: 6.1 g/dL — AB (ref 6.5–8.1)

## 2018-02-22 LAB — CBC
HEMATOCRIT: 29 % — AB (ref 35.0–47.0)
HEMOGLOBIN: 8.8 g/dL — AB (ref 12.0–16.0)
MCH: 25.3 pg — ABNORMAL LOW (ref 26.0–34.0)
MCHC: 30.5 g/dL — AB (ref 32.0–36.0)
MCV: 82.9 fL (ref 80.0–100.0)
Platelets: 276 10*3/uL (ref 150–440)
RBC: 3.5 MIL/uL — ABNORMAL LOW (ref 3.80–5.20)
RDW: 20 % — AB (ref 11.5–14.5)
WBC: 11.4 10*3/uL — ABNORMAL HIGH (ref 3.6–11.0)

## 2018-02-22 LAB — GLUCOSE, CAPILLARY
GLUCOSE-CAPILLARY: 163 mg/dL — AB (ref 65–99)
Glucose-Capillary: 138 mg/dL — ABNORMAL HIGH (ref 65–99)
Glucose-Capillary: 144 mg/dL — ABNORMAL HIGH (ref 65–99)
Glucose-Capillary: 147 mg/dL — ABNORMAL HIGH (ref 65–99)
Glucose-Capillary: 149 mg/dL — ABNORMAL HIGH (ref 65–99)
Glucose-Capillary: 160 mg/dL — ABNORMAL HIGH (ref 65–99)

## 2018-02-22 LAB — PROTIME-INR
INR: 2.06
Prothrombin Time: 23 seconds — ABNORMAL HIGH (ref 11.4–15.2)

## 2018-02-22 LAB — PROCALCITONIN: Procalcitonin: 0.29 ng/mL

## 2018-02-22 MED ORDER — INSULIN ASPART 100 UNIT/ML ~~LOC~~ SOLN: 0.0000 [IU] | Freq: Every day | SUBCUTANEOUS | Status: DC

## 2018-02-22 MED ORDER — ENOXAPARIN SODIUM 120 MG/0.8ML ~~LOC~~ SOLN
105.0000 mg | Freq: Two times a day (BID) | SUBCUTANEOUS | Status: DC
  Administered 2018-02-22 – 2018-02-23 (×4): 105 mg via SUBCUTANEOUS
  Filled 2018-02-22 (×5): qty 0.8

## 2018-02-22 MED ORDER — INSULIN ASPART 100 UNIT/ML ~~LOC~~ SOLN: 0.0000 [IU] | Freq: Three times a day (TID) | SUBCUTANEOUS | Status: DC

## 2018-02-22 MED ORDER — HALOPERIDOL LACTATE 5 MG/ML IJ SOLN
2.0000 mg | Freq: Four times a day (QID) | INTRAMUSCULAR | Status: DC | PRN
  Administered 2018-02-22 – 2018-02-23 (×2): 2 mg via INTRAVENOUS
  Filled 2018-02-22 (×3): qty 1

## 2018-02-22 MED ORDER — INSULIN ASPART 100 UNIT/ML ~~LOC~~ SOLN
0.0000 [IU] | SUBCUTANEOUS | Status: DC
  Administered 2018-02-22: 3 [IU] via SUBCUTANEOUS
  Administered 2018-02-23 (×2): 2 [IU] via SUBCUTANEOUS
  Administered 2018-02-23: 3 [IU] via SUBCUTANEOUS
  Administered 2018-02-23 – 2018-02-25 (×8): 2 [IU] via SUBCUTANEOUS
  Administered 2018-02-25: 3 [IU] via SUBCUTANEOUS
  Administered 2018-02-25 (×3): 2 [IU] via SUBCUTANEOUS
  Administered 2018-02-25 – 2018-02-26 (×4): 3 [IU] via SUBCUTANEOUS
  Administered 2018-02-26: 5 [IU] via SUBCUTANEOUS
  Administered 2018-02-26 (×2): 3 [IU] via SUBCUTANEOUS
  Administered 2018-02-26: 2 [IU] via SUBCUTANEOUS
  Administered 2018-02-27 (×2): 5 [IU] via SUBCUTANEOUS
  Filled 2018-02-22 (×26): qty 1

## 2018-02-22 NOTE — Progress Notes (Signed)
[place] at [place] NAME: Betty [name]    MR#:  [id number]  DATE OF BIRTH:  Jun 01, 1946  SUBJECTIVE:    Patient started on diltiazem yesterday due to elevated heart rates. Family is requesting transfer to [facility]   Review of Systems  Constitutional: Negative.  Negative for chills, fever and malaise/fatigue.  HENT: Negative.  Negative for ear discharge, ear pain, hearing loss, nosebleeds and sore throat.   Eyes: Negative.  Negative for blurred vision and pain.  Respiratory: Positive for shortness of breath. Negative for cough, hemoptysis and wheezing.   Cardiovascular: Negative.  Negative for chest pain, palpitations and leg swelling.  Gastrointestinal: Negative.  Negative for abdominal pain, blood in stool, diarrhea, nausea and vomiting.  Genitourinary: Negative.  Negative for dysuria.  Musculoskeletal: Positive for falls and neck pain. Negative for back pain.  Skin: Negative.   Neurological: Negative for dizziness, tremors, speech change, focal weakness, seizures and headaches.  Endo/Heme/Allergies: Negative.  Does not bruise/bleed easily.  Psychiatric/Behavioral: Positive for memory loss. Negative for depression, hallucinations and suicidal ideas.     Tolerating Diet: yes     DRUG ALLERGIES:   Allergies  Allergen Reactions  . Flecainide Shortness Of Breath and Other (See Comments)    Reaction: dizziness   . Amiodarone Other (See Comments)    Pt states that this medication causes lung bleeding.      VITALS:  Blood pressure 134/84, pulse 94, temperature 98.2 F (36.8 C), temperature source Oral, resp. rate 19, height 5\' 9"  (1.753 m), weight 104 kg (229 lb 4.5 oz), SpO2 97 %.  PHYSICAL EXAMINATION:  Constitutional: Patient appears well-developed wearing cervical collar  HENT: Normocephalic. Marland Kitchen Oropharynx is clear and moist.  Eyes: PERRLA, no scleral icterus.  Neck: Cervical collar placed  CVS: RRR, S1/S2 +, no murmurs, no gallops,  no carotid bruit.  Pulmonary: decreased breath sounds right base  Abdominal: Soft. BS +,  no distension, tenderness, rebound or guarding.  Musculoskeletal: Normal range of motion. No edema and no tenderness.  Neuro: Following commands no focal deficit  Skin: Skin is warm and dry. No rash noted. Psychiatric: Alert   LABORATORY PANEL:   CBC Recent Labs  Lab 02/22/18 0255  WBC 11.4*  HGB 8.8*  HCT 29.0*  PLT 276   ------------------------------------------------------------------------------------------------------------------  Chemistries  Recent Labs  Lab 02/22/18 0255  NA 143  K 4.0  CL 103  CO2 30  GLUCOSE 127*  BUN 27*  CREATININE 0.68  CALCIUM 8.5*  AST 455*  ALT 287*  ALKPHOS 114  BILITOT 2.2*   ------------------------------------------------------------------------------------------------------------------  Cardiac Enzymes Recent Labs  Lab 02/19/18 0709  TROPONINI <0.03   ------------------------------------------------------------------------------------------------------------------  RADIOLOGY:  Dg Chest Port 1 View  Result Date: 02/21/2018 CLINICAL DATA:  Respiratory failure EXAM: PORTABLE CHEST 1 VIEW COMPARISON:  February 19, 2018 FINDINGS: Stable pacemaker. The heart, hila, and mediastinum are stable. No nodules or masses. Small right effusion. Continued mild edema/pulmonary venous congestion. No other changes. IMPRESSION: No interval change. Stable small right effusion. Pulmonary venous congestion/mild edema. Electronically Signed   By: Dorise Bullion III M.D   On: 02/21/2018 07:18     ASSESSMENT AND PLAN:   72 year old female status post mechanical fall who suffered C1/C2 fracture and now in the ICU for acute encephalopathy with hypotension  1. Acute hypoxic/hypercapnic respiratory failure due to sedating medications and acute on chronic diastolic heart failure Wean oxygen to room air as tolerated  2. Acute encephalopathy in the setting  of  problem #1 and pain medications Patient with some improvement  3. Hypotension, unclear etiology: Weaned off of neo  4. C1/C2 fracture after mechanical fall: Neurosurgery is recommending inpatient CTA to rule out vertebral artery injury Creatinine still elevated Patient unstable this time Continue neck brace Patient will need this neck brace for 3 months She will follow up with neurosurgery in 2-4 weeks.  5. Acute kidney injury: Creatinine has improved 6. Hyperkalemia: This has resolved 7. Leukocytosis of unclear etiology This is improving UA and chest x-ray do not show evidence of pneumonia  8. Vasculitis Patient was on Imuran  9. Diabetes: Sliding scale insulin  10. PAF: Currently on diltiazem drip due to rapid ventricular rate Wean as tolerated Continue Rythmol   11. Acute on chronic diastolic heart failure: Lasix 20 mg IV 1  12. Elevated LFTs: This could be due to hepatic congestion from CHF Patient not complaining of abdominal pain Repeat LFTs in a.m. Consider abdominal ultrasound PT and speech consultation requested  13. History of mechanical mitral valve replacement: Continue Lovenox    Family requesting transfer to [facility]: FULL  TOTAL TIME TAKING CARE OF THIS PATIENT: 24 minutes.     POSSIBLE D/C ??, DEPENDING ON CLINICAL CONDITION.   Callaghan Laverdure M.D on 02/22/2018 at 10:58 AM  Between 7am to 6pm - Pager - [phone] After 6pm go to [email or web address] - password [email or web address] [facility]  Office  [phone]  CC: Primary care physician; Dion Body, MD  Note: This dictation was prepared with Dragon dictation along with smaller phrase technology. Any transcriptional errors that result from this process are unintentional.

## 2018-02-22 NOTE — Progress Notes (Signed)
PT Cancellation Note  Patient Details Name: Betty [name] MRN: [id number] DOB: 18-Apr-1946   Cancelled Treatment:    Reason Eval/Treat Not Completed: Patient not medically ready   Chart reviewed.  Discussed with primary nurse - requested to hold session as she had recently received medication for heart rate.  O2 sats running low.  Pt does seem mentally clearer today - remembered writer from past admission and from when I treated her husband.  Will continue as appropriate.   Chesley Noon 02/22/2018, 12:28 PM

## 2018-02-22 NOTE — Progress Notes (Signed)
Pt became progressively more agitated and delirious throughout shift and began to pull at lines/oxygen tubing and had attempted to exit bed. elink physician, Agarwala MD, contacted and order for haliperidol placed. Haliperidol seemed to help Pt rest and decrease agitation, Pt still delirious. Will continue to monitor.

## 2018-02-22 NOTE — Discharge Summary (Signed)
Physician Discharge Summary  Patient ID: Betty [name] MRN: [id number] DOB/AGE: 24-Mar-1946 72 y.o.  Admit date: 02/19/2018 Discharge date: 02/22/2018  Admission Diagnoses: Cervical Neck Fracture Afib with RVR ESBL UTI COPD exacerbation  Discharge Diagnoses:  Active Problems:   C2 cervical fracture (HCC) COPD exacerbation ESBL UTI    Hospital Course:  Admitted to ICU/SD unit Treated with BiPAP C collar in place for C1/C2 cervical neck fracture-Neurosurgical consultation states nondisplaced fracture and recommend CTA/MRA to r/o dissection however patient had acute renal failure.  At this time patient is in afib with RVR on cardizem infusion and requiring intermittent biPAP  Patient and family have requested transfer to [facility] for further evaluation  Consults:NEUROSURGERY PULMONARY  Significant Diagnostic Studies: CT NECK  Treatments:  BiPAP Cardizem Oxygen precedex  Discharge Exam: Blood pressure 134/84, pulse 94, temperature 98.2 F (36.8 C), temperature source Oral, resp. rate 19, height 5\' 9"  (1.753 m), weight 229 lb 4.5 oz (104 kg), SpO2 97 %.   Disposition: [facility]  DISCHARGE MEDS CARDIZEM INFUSION  Contact information for after-discharge care    Destination    [facility] .   Service:  Skilled Nursing Contact information: [address] [place] [phone]              Signed: Flora Lipps 02/22/2018, 10:35 AM

## 2018-02-22 NOTE — Progress Notes (Signed)
[facility] Progress Note Patient Name: Betty [name] DOB: November 13, 1946 MRN: [id number]   Date of Service  02/22/2018  HPI/Events of Note  Called by RN for agitation. Patient trying to get out bed. On camera awake but ,mildly agitated.   eICU Interventions  Haloperidol (QTC 429)     Intervention Category Major Interventions: Delirium, psychosis, severe agitation - evaluation and management  Maud Rubendall 02/22/2018, 12:41 AM

## 2018-02-22 NOTE — Progress Notes (Signed)
CC follow up mental status changes  SUBJECTIVE  More alert.  Comfortable on nasal cannula oxygen.  Off of precedex  Calm but poorly oriented.   On cardizem infusion   Vitals:   02/22/18 0300 02/22/18 0600 02/22/18 0700 02/22/18 0744  BP: 121/69 (!) 141/86 (!) 144/72   Pulse: (!) 117 (!) 131 (!) 131   Resp: (!) 22 (!) 22 (!) 23   Temp:    98.2 F (36.8 C)  TempSrc:    Oral  SpO2: 92% 93% 95%   Weight:      Height:       HEENT: Abrasion and contusion on forehead, periorbital ecchymoses-not significantly changed Neck: Cervical collar in place Chest: Bilateral crackles, no wheezes Cardiac: Regular, mechanical first heart sound, no M Abdomen: Soft, bowel sounds present, nontender Extremities: Warm, no edema Neuro: Cranial nerves intact, no focal deficits, diffusely weak   BMP Latest Ref Rng & Units 02/22/2018 02/21/2018 02/20/2018  Glucose 65 - 99 mg/dL 127(H) 150(H) 158(H)  BUN 6 - 20 mg/dL 27(H) 51(H) 50(H)  Creatinine 0.44 - 1.00 mg/dL 0.68 1.28(H) 2.03(H)  Sodium 135 - 145 mmol/L 143 137 131(L)  Potassium 3.5 - 5.1 mmol/L 4.0 4.5 5.4(H)  Chloride 101 - 111 mmol/L 103 99(L) 94(L)  CO2 22 - 32 mmol/L 30 28 22   Calcium 8.9 - 10.3 mg/dL 8.5(L) 8.0(L) 8.0(L)    CBC Latest Ref Rng & Units 02/22/2018 02/21/2018 02/20/2018  WBC 3.6 - 11.0 K/uL 11.4(H) 16.4(H) 21.5(H)  Hemoglobin 12.0 - 16.0 g/dL 8.8(L) 8.7(L) 9.5(L)  Hematocrit 35.0 - 47.0 % 29.0(L) 28.0(L) 29.6(L)  Platelets 150 - 440 K/uL 276 338 422    CXR: NSC diffuse bilateral symmetric interstitial prominence  IMP: Acute nondisplaced cervical spine fracture after fall Acute/chronic hypoxemic respiratory failure Chronic interstitial lung disease Hypotension, unclear etiology-improving 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  Remains poorly oriented but much calmer  PLAN/REC: Continue supplemental  oxygen Continue hydrocortisone as cortisol level was normal Continue to wean phenylephrine for MAP goal >65 mmHg as tolerated Discontinue HCO3 infusion Advance diet-SLP evaluation requested Advance activity-PT evaluation requested She is on full dose enoxaparin for mechanical heart valve.  Once able, transition back to warfarin She will need placement in some kind of rehab/SNF facility Her baseline function is poor and goals of care should be addressed                              - She seems to be a poor candidate for mechanical ventilation or ACLS Plan to wean off cardizem infusion as tolerated   Remains SD status  Maretta Bees Patricia Pesa, M.D.  [facility]  Medical Director [place] Director [facility] Cardio-Pulmonary Department

## 2018-02-22 NOTE — Progress Notes (Signed)
Await further word from [facility] regarding bed assignment. Pt is alert and intermittently confused. CAM + for inattention. Follows all commands.Speech clear.  Failed bedside swallow.Kept NPO. SLT not here today and will need to evaluate. Spoke with dietician. Pt not candidate for TF yet as she is on and off BIPAP. She has reportedly not slept at any length for > 72 hours. When dozes she drops sats into low 80's. Monitors Afib with V pacing and frequent  PVC's. Cardizem gtt at 15mg /hr. Unable to take po rythmol safely. Metoprolol 12.5 mg IV x1 for HR greater than 115/min sustained. Son has stayed at bedside most of day.

## 2018-02-22 NOTE — Clinical Social Work Note (Signed)
CSW has updated Broadus John, admissions coordinator at [facility], that the patient is slated to transfer to [facility]. CSW will continue to follow should the transfer not proceed.  Santiago Bumpers, MSW, Latanya Presser [phone]

## 2018-02-22 NOTE — Progress Notes (Signed)
[place] for enoxaparin dosing  Indication: atrial fibrillation, history of DVT/PE and Mitral Valve replacement  Allergies  Allergen Reactions  . Flecainide Shortness Of Breath and Other (See Comments)    Reaction: dizziness   . Amiodarone Other (See Comments)    Pt states that this medication causes lung bleeding.      Patient Measurements: Height: 5\' 9"  (175.3 cm) Weight: 229 lb 4.5 oz (104 kg) IBW/kg (Calculated) : 66.2  Vital Signs: Temp: 98.2 F (36.8 C) (03/03 0744) Temp Source: Oral (03/03 0744) BP: 144/72 (03/03 0700) Pulse Rate: 131 (03/03 0700)  Labs: Recent Labs    02/20/18 0446 02/21/18 0436 02/22/18 0255  HGB 9.5* 8.7* 8.8*  HCT 29.6* 28.0* 29.0*  PLT 422 338 276  LABPROT 46.1* 37.2* 23.0*  INR 5.01* 3.80 2.06  CREATININE 2.03* 1.28* 0.68    Estimated Creatinine Clearance: 82.8 mL/min (by C-G formula based on SCr of 0.68 mg/dL).   Medical History: Past Medical History:  Diagnosis Date  . Acute diastolic heart failure ([place])   . Allergy   . ANCA-associated vasculitis ([place])   . Asthma   . Atrial fibrillation ([place])   . Backache, unspecified   . Cardiomegaly   . COPD (chronic obstructive pulmonary disease) ([place])   . Diabetes mellitus without complication ([place])   . 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 Esophageal reflux   . Headache(784.0)   . Herpes zoster without mention of complication   . Hx: UTI (urinary tract infection)   . Hypertension    heart controlled w CHF  . Nontoxic uninodular goiter   . Obesity, unspecified   . Osteoarthrosis, unspecified whether generalized or localized, unspecified site   . Unspecified sleep apnea   . Urine incontinence    hx of   Assessment: 72 y/o F with a h/o atrial fibrillation, DVT/PE and mitral valve replacement, goal INR 2.5-3.5, on warfarin admitted after fall at [facility] with C1/C2 fractures. Patient was taking warfarin 6 mg daily PTA  and is admitted with elevated INR. CT with no acute intracranial abnormality but left scalp hematoma. No surgery with plan for neck brace only per neurosurgery.   3/3 INR = 2.06  Plan:  Patient being transitioned to enoxaparin 105 mg SQ BID while PO medications are being held.  Will monitor INRs daily.   Pharmacy will continue to monitor and adjust per consult.  Frederico Gerling K, RPH 02/22/2018,7:47 AM

## 2018-02-23 ENCOUNTER — Inpatient Hospital Stay: Payer: Medicare Other

## 2018-02-23 DIAGNOSIS — J9601 Acute respiratory failure with hypoxia: Secondary | ICD-10-CM

## 2018-02-23 LAB — COMPREHENSIVE METABOLIC PANEL
ALK PHOS: 124 U/L (ref 38–126)
ALT: 255 U/L — ABNORMAL HIGH (ref 14–54)
ANION GAP: 13 (ref 5–15)
AST: 321 U/L — ABNORMAL HIGH (ref 15–41)
Albumin: 3.5 g/dL (ref 3.5–5.0)
BUN: 17 mg/dL (ref 6–20)
CALCIUM: 8.6 mg/dL — AB (ref 8.9–10.3)
CHLORIDE: 106 mmol/L (ref 101–111)
CO2: 30 mmol/L (ref 22–32)
CREATININE: 0.64 mg/dL (ref 0.44–1.00)
Glucose, Bld: 165 mg/dL — ABNORMAL HIGH (ref 65–99)
Potassium: 4.3 mmol/L (ref 3.5–5.1)
SODIUM: 149 mmol/L — AB (ref 135–145)
Total Bilirubin: 2.5 mg/dL — ABNORMAL HIGH (ref 0.3–1.2)
Total Protein: 6.9 g/dL (ref 6.5–8.1)

## 2018-02-23 LAB — GLUCOSE, CAPILLARY
GLUCOSE-CAPILLARY: 143 mg/dL — AB (ref 65–99)
GLUCOSE-CAPILLARY: 154 mg/dL — AB (ref 65–99)
GLUCOSE-CAPILLARY: 149 mg/dL — AB (ref 65–99)
GLUCOSE-CAPILLARY: 149 mg/dL — AB (ref 65–99)
Glucose-Capillary: 134 mg/dL — ABNORMAL HIGH (ref 65–99)
Glucose-Capillary: 135 mg/dL — ABNORMAL HIGH (ref 65–99)

## 2018-02-23 LAB — PROTIME-INR
INR: 1.72
Prothrombin Time: 20 seconds — ABNORMAL HIGH (ref 11.4–15.2)

## 2018-02-23 LAB — CBC
HEMATOCRIT: 29 % — AB (ref 35.0–47.0)
Hemoglobin: 9.2 g/dL — ABNORMAL LOW (ref 12.0–16.0)
MCH: 26 pg (ref 26.0–34.0)
MCHC: 31.5 g/dL — AB (ref 32.0–36.0)
MCV: 82.3 fL (ref 80.0–100.0)
Platelets: 269 10*3/uL (ref 150–440)
RBC: 3.53 MIL/uL — ABNORMAL LOW (ref 3.80–5.20)
RDW: 19.9 % — AB (ref 11.5–14.5)
WBC: 10.7 10*3/uL (ref 3.6–11.0)

## 2018-02-23 LAB — URINE CULTURE

## 2018-02-23 LAB — PHOSPHORUS: PHOSPHORUS: 2.5 mg/dL (ref 2.5–4.6)

## 2018-02-23 LAB — MAGNESIUM: MAGNESIUM: 2.3 mg/dL (ref 1.7–2.4)

## 2018-02-23 MED ORDER — METOPROLOL TARTRATE 5 MG/5ML IV SOLN
2.5000 mg | Freq: Four times a day (QID) | INTRAVENOUS | Status: DC | PRN
  Administered 2018-02-23 – 2018-02-24 (×3): 5 mg via INTRAVENOUS
  Filled 2018-02-23 (×4): qty 5

## 2018-02-23 MED ORDER — PANTOPRAZOLE SODIUM 40 MG IV SOLR
40.0000 mg | INTRAVENOUS | Status: DC
  Administered 2018-02-23 – 2018-03-01 (×7): 40 mg via INTRAVENOUS
  Filled 2018-02-23 (×7): qty 40

## 2018-02-23 NOTE — Progress Notes (Signed)
CC follow up mental status changes  SUBJECTIVE  More alert.  Comfortable on nasal cannula oxygen.  Off of precedex  Calm but poorly oriented.   On cardizem infusion CPAP last night intermittently   Vitals:   02/23/18 0400 02/23/18 0500 02/23/18 0600 02/23/18 0742  BP: (!) 158/75 (!) 156/101 (!) 142/87   Pulse: (!) 119 (!) 125 (!) 128   Resp: 20 (!) 25 (!) 21   Temp:    97.8 F (36.6 C)  TempSrc:    Oral  SpO2: 96% 96% 96%   Weight:      Height:       HEENT: Abrasion and contusion on forehead, periorbital ecchymoses-not significantly changed Neck: Cervical collar in place Chest: Bilateral crackles, no wheezes Cardiac: Regular, mechanical first heart sound, no M Abdomen: Soft, bowel sounds present, nontender Extremities: Warm, no edema Neuro: Cranial nerves intact, no focal deficits, diffusely weak   BMP Latest Ref Rng & Units 02/23/2018 02/22/2018 02/21/2018  Glucose 65 - 99 mg/dL 165(H) 127(H) 150(H)  BUN 6 - 20 mg/dL 17 27(H) 51(H)  Creatinine 0.44 - 1.00 mg/dL 0.64 0.68 1.28(H)  Sodium 135 - 145 mmol/L 149(H) 143 137  Potassium 3.5 - 5.1 mmol/L 4.3 4.0 4.5  Chloride 101 - 111 mmol/L 106 103 99(L)  CO2 22 - 32 mmol/L 30 30 28   Calcium 8.9 - 10.3 mg/dL 8.6(L) 8.5(L) 8.0(L)    CBC Latest Ref Rng & Units 02/23/2018 02/22/2018 02/21/2018  WBC 3.6 - 11.0 K/uL 10.7 11.4(H) 16.4(H)  Hemoglobin 12.0 - 16.0 g/dL 9.2(L) 8.8(L) 8.7(L)  Hematocrit 35.0 - 47.0 % 29.0(L) 29.0(L) 28.0(L)  Platelets 150 - 440 K/uL 269 276 338    CXR: NSC diffuse bilateral symmetric interstitial prominence  IMP: Acute nondisplaced cervical spine fracture after fall Acute/chronic hypoxemic respiratory failure Chronic interstitial lung disease Hypotension, unclear etiology-improving 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  Remains poorly oriented but much calmer  PLAN/REC: Continue  supplemental oxygen Continue hydrocortisone as cortisol level was normal Continue to wean phenylephrine for MAP goal >65 mmHg as tolerated Discontinue HCO3 infusion Advance diet-SLP evaluation requested Advance activity-PT evaluation requested She is on full dose enoxaparin for mechanical heart valve.  Once able, transition back to warfarin She will need placement in some kind of rehab/SNF facility Her baseline function is poor and goals of care should be addressed                              - She seems to be a poor candidate for mechanical ventilation or ACLS Plan to wean off cardizem infusion as tolerated   Remains SD status Plan to transfer to [place], M.D.  [facility]  Medical Director [place] Director [facility] Cardio-Pulmonary Department

## 2018-02-23 NOTE — Progress Notes (Addendum)
PT Cancellation Note  Patient Details Name: Betty [name] MRN: [id number] DOB: 1946/02/03   Cancelled Treatment:    Reason Eval/Treat Not Completed: Medical issues which prohibited therapy(Patient with persistent HR control issues; will continue to monitor  Also, per chart, pending transfer to [facility] ICU when bed available.  Will contine to follow and treat as appropriate.)  Chelsey Redondo H. Owens Shark, PT, DPT, NCS 02/23/18, 9:16 PM [phone]

## 2018-02-23 NOTE — Progress Notes (Signed)
   02/23/18 1340  Clinical Encounter Type  Visited With Patient and family together  Visit Type Follow-up   Chaplain followed up with patient and family.  They expressed appreciation for the visit and would prefer chaplain to check back another time.

## 2018-02-23 NOTE — Plan of Care (Signed)
Pt has improved this shift from prior after sleeping all night. Has been awake all day today and has been alert and oriented x4. Pt has had no complaints other than wanting to eat, however she failed her SLP eval and is to go for a barium swallow tomorrow. Pt's heart rate has been maintaining in the 110s-120s despite cardizem drip at 15 and 2.5mg  IVP metoprolol.   [facility] transfer center called to get a clinical update and reported that they are still waiting on an ICU bed to open up for her. They will either call with a bed assignment tomorrow or call for a clinical update.

## 2018-02-23 NOTE — Evaluation (Signed)
Clinical/Bedside Swallow Evaluation Patient Details  Name: GLENDINE SWETZ MRN: [id number] Date of Birth: 03/17/1946  Today's Date: 02/23/2018 Time: SLP Start Time (ACUTE ONLY): 0830 SLP Stop Time (ACUTE ONLY): 0915 SLP Time Calculation (min) (ACUTE ONLY): 45 min  Past Medical History:  Past Medical History:  Diagnosis Date  . Acute diastolic heart failure ([place])   . Allergy   . ANCA-associated vasculitis ([place])   . Asthma   . Atrial fibrillation ([place])   . Backache, unspecified   . Cardiomegaly   . COPD (chronic obstructive pulmonary disease) ([place])   . Diabetes mellitus without complication ([place])   . 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 Esophageal reflux   . Headache(784.0)   . Herpes zoster without mention of complication   . Hx: UTI (urinary tract infection)   . Hypertension    heart controlled w CHF  . Nontoxic uninodular goiter   . Obesity, unspecified   . Osteoarthrosis, unspecified whether generalized or localized, unspecified site   . Unspecified sleep apnea   . Urine incontinence    hx of   Past Surgical History:  Past Surgical History:  Procedure Laterality Date  . ABDOMINAL HYSTERECTOMY  1979   complete (for precancerous cells)  . ABLATION  2011 & 2014  . bladder botox  01/26/2018  . CHOLECYSTECTOMY    . CYSTOSCOPY WITH FULGERATION N/A 01/28/2018   Procedure: [place] AND CLOT EVACUATION;  Surgeon: Hollice Espy, MD;  Location: [facility] ORS;  Service: Urology;  Laterality: N/A;  . Interstim Placement  2012  . OOPHORECTOMY     HPI:  Pt is a 72 y.o. female with a known h/o Obesity, diastolic heart failure, atrial fibrillation, COPD, diabetes mellitus, GERD, hypertension, osteoarthritis. Pt recently admitted to this ED for blood in the urine after Botox injection to the bladder. Pt presented from nursing home after a fall. Patient reports that she got off balance and fell on her head. CT scan of the neck shows C1/C2  fracture. Pt has required BiPAP/CPAP intermittently but is now on [place] O2 support. Pt is wearing a neck brace for stabilization.  She is more alert now. Per NSG report, pt has had difficulty swallowing upon attempting initial po trials.    Assessment / Plan / Recommendation Clinical Impression  Pt appeared to present w/ moderate-severe s/s of pharyngeal phase dysphagia c/b multiple swallows, wet vocal quality, and coughing w/ trials. Pt appeared uncomfortable during attempted repeated swallows w/ trials. Pt was able to cough and expectorate phlegm of bolus residue, some of which was dark in color(not bolus trials given). Oral phase appeared grossly [facility] for bolus management, A-P transfer, and oral clearing. Due to the immediate nature of pt's difficulty during the pharyngeal phase of swallowing, no further trials were given. Discussed results/concerns w/ pt/Son and recommended frequent oral care for hygiene and stimulation using swabs; modeled use of swabs w/ pt/Son. Discussed pt's presentation w/ MD/NSG who agreed w/ strict NPO status and f/u w/ MBSS tomorrow as appropriate. Discussed pt's baseline GERD and Esophageal dysmotility w/ meals intermittently but need to further assess pharyngeal phase of swallowing currently.   SLP Visit Diagnosis: Dysphagia, pharyngeal phase (R13.13);Dysphagia, pharyngoesophageal phase (R13.14)    Aspiration Risk  Severe aspiration risk    Diet Recommendation  NPO w/ frequent oral care; aspiration precautions  Medication Administration: Via alternative means    Other  Recommendations Recommended Consults: (Dietician following) Oral Care Recommendations: Oral care QID;Staff/trained caregiver to provide  oral care;Patient independent with oral care(w/ aspiration precautions) Other Recommendations: (TBD)   Follow up Recommendations (TBD)      Frequency and Duration min 3x week  2 weeks       Prognosis Prognosis for Safe Diet Advancement: Guarded Barriers to Reach  Goals: Severity of deficits      Swallow Study   General Date of Onset: 02/19/18 HPI: Pt is a 72 y.o. female with a known h/o Obesity, diastolic heart failure, atrial fibrillation, COPD, diabetes mellitus, GERD, hypertension, osteoarthritis. Pt recently admitted to this ED for blood in the urine after Botox injection to the bladder. Pt presented from nursing home after a fall. Patient reports that she got off balance and fell on her head. CT scan of the neck shows C1/C2 fracture. Pt has required BiPAP/CPAP intermittently but is now on [place] O2 support. Pt is wearing a neck brace for stabilization.  She is more alert now. Per NSG report, pt has had difficulty swallowing upon attempting initial po trials.  Type of Study: Bedside Swallow Evaluation Previous Swallow Assessment: none reported Diet Prior to this Study: NPO Temperature Spikes Noted: (wbc 10.7; temp 99) Respiratory Status: Nasal cannula(5 liters) History of Recent Intubation: No Behavior/Cognition: Alert;Cooperative;Pleasant mood;Requires cueing(min) Oral Cavity Assessment: Dry;Dried secretions Oral Care Completed by SLP: Recent completion by staff Oral Cavity - Dentition: Missing dentition Vision: Functional for self-feeding Self-Feeding Abilities: Able to feed self;Needs set up;Needs assist;Total assist Patient Positioning: Upright in bed(head/neck supported) Baseline Vocal Quality: Low vocal intensity(grossly wfl) Volitional Cough: Congested;Strong Volitional Swallow: Able to elicit    Oral/Motor/Sensory Function Overall Oral Motor/Sensory Function: Within functional limits   Ice Chips Ice chips: Impaired Presentation: Spoon(fed; 5 trials) Oral Phase Impairments: (none) Oral Phase Functional Implications: (none) Pharyngeal Phase Impairments: Throat Clearing - Delayed;Multiple swallows(x2)   Thin Liquid Thin Liquid: Not tested    Nectar Thick Nectar Thick Liquid: Impaired Presentation: Straw;Self Fed(assisted; small, single  trials x2) Oral Phase Impairments: (none) Oral phase functional implications: (none) Pharyngeal Phase Impairments: Multiple swallows;Cough - Delayed;Throat Clearing - Delayed;Wet Vocal Quality Other Comments: pinched straw to monitor bolus size each time   Honey Thick Honey Thick Liquid: Not tested   Puree Puree: Impaired Presentation: Spoon(fed; 2 trials) Oral Phase Impairments: (none) Oral Phase Functional Implications: (none) Pharyngeal Phase Impairments: Multiple swallows;Wet Vocal Quality;Throat Clearing - Delayed;Cough - Delayed Other Comments: coughed and cleared dark phlegm   Solid   GO   Solid: Not tested          Orinda Kenner, MS, CCC-SLP Watson,Katherine 02/23/2018,2:45 PM

## 2018-02-23 NOTE — Progress Notes (Addendum)
[place] [facility] [place] NAME: Betty [name]    MR#:  [id number]  DATE OF BIRTH:  10/17/1946  SUBJECTIVE:    Patient alert this am Still on dilt gtt    Review of Systems  Constitutional: Negative.  Negative for chills, fever and malaise/fatigue.  HENT: Negative.  Negative for ear discharge, ear pain, hearing loss, nosebleeds and sore throat.   Eyes: Negative.  Negative for blurred vision and pain.  Respiratory: Positive for shortness of breath. Negative for cough, hemoptysis and wheezing.   Cardiovascular: Negative.  Negative for chest pain, palpitations and leg swelling.  Gastrointestinal: Negative.  Negative for abdominal pain, blood in stool, diarrhea, nausea and vomiting.  Genitourinary: Negative.  Negative for dysuria.  Musculoskeletal: Positive for falls and neck pain. Negative for back pain.  Skin: Negative.   Neurological: Negative for dizziness, tremors, speech change, focal weakness, seizures and headaches.  Endo/Heme/Allergies: Negative.  Does not bruise/bleed easily.  Psychiatric/Behavioral: Positive for memory loss. Negative for depression, hallucinations and suicidal ideas.     Tolerating Diet: yes     DRUG ALLERGIES:   Allergies  Allergen Reactions  . Flecainide Shortness Of Breath and Other (See Comments)    Reaction: dizziness   . Amiodarone Other (See Comments)    Pt states that this medication causes lung bleeding.      VITALS:  Blood pressure (!) 143/85, pulse (!) 128, temperature 97.8 F (36.6 C), temperature source Oral, resp. rate (!) 23, height 5\' 9"  (1.753 m), weight 104 kg (229 lb 4.5 oz), SpO2 91 %.  PHYSICAL EXAMINATION:  Constitutional: Patient appears well-developed wearing cervical collar  HENT: Normocephalic. Marland Kitchen Oropharynx is clear and moist.  Eyes: PERRLA, no scleral icterus.  Neck: Cervical collar placed  CVS: RRR, S1/S2 +, no murmurs, no gallops, no carotid bruit.  Pulmonary: decreased breath  sounds right base  Abdominal: Soft. BS +,  no distension, tenderness, rebound or guarding.  Musculoskeletal: Normal range of motion. No edema and no tenderness.  Neuro: Following commands no focal deficit  Skin: Skin is warm and dry. No rash noted. Psychiatric: Alert   LABORATORY PANEL:   CBC Recent Labs  Lab 02/23/18 0053  WBC 10.7  HGB 9.2*  HCT 29.0*  PLT 269   ------------------------------------------------------------------------------------------------------------------  Chemistries  Recent Labs  Lab 02/23/18 0053  NA 149*  K 4.3  CL 106  CO2 30  GLUCOSE 165*  BUN 17  CREATININE 0.64  CALCIUM 8.6*  MG 2.3  AST 321*  ALT 255*  ALKPHOS 124  BILITOT 2.5*   ------------------------------------------------------------------------------------------------------------------  Cardiac Enzymes Recent Labs  Lab 02/19/18 0709  TROPONINI <0.03   ------------------------------------------------------------------------------------------------------------------  RADIOLOGY:  Dg Chest Port 1 View  Result Date: 02/23/2018 CLINICAL DATA:  COPD EXAM: PORTABLE CHEST 1 VIEW COMPARISON:  02/21/2018 FINDINGS: Cardiac shadow is stable. Postsurgical changes are seen. Pacing device is again noted and stable. Stable right-sided pleural effusion is noted. Persistent interstitial changes are noted particularly in the bases stable from the prior exam. No new focal infiltrate is seen. No bony abnormality is noted. IMPRESSION: No change from the previous exam. Electronically Signed   By: Inez Catalina M.D.   On: 02/23/2018 07:04     ASSESSMENT AND PLAN:   72 year old female status post mechanical fall who suffered C1/C2 fracture and now in the ICU for acute encephalopathy with hypotension  1. Acute hypoxic/hypercapnic respiratory failure due to sedating medications and acute on chronic diastolic heart failure Wean oxygen  to room air as tolerated  2. Acute encephalopathy in the  setting of problem #1 and pain medications Patient with improvement  3. Hypotension, unclear etiology: Weaned off of neo  4. C1/C2 fracture after mechanical fall: Neurosurgery is recommending inpatient CTA to rule out vertebral artery injury Creatinine still elevated Patient unstable this time Continue neck brace Patient will need this neck brace for at least  3 months She will follow up with neurosurgery in 2-4 weeks.  5. Acute kidney injury: Creatinine has improved 6. Hyperkalemia: This has resolved 7. Leukocytosis of unclear etiology This is improving UA and chest x-ray do not show evidence of pneumonia  8. Vasculitis Patient on Imuran  9. Diabetes: Sliding scale insulin  10. PAF: Currently on diltiazem drip due to rapid ventricular rate Wean as tolerated Continue Rythmol Cont Lovenox  11. Acute on chronic diastolic heart failure: Lasix 20 mg IV 1 given  12. Elevated LFTs: This could be due to hepatic congestion from CHF Patient not complaining of abdominal pain       Family requesting transfer to [place]: FULL  TOTAL TIME TAKING CARE OF THIS PATIENT: 20 minutes.     POSSIBLE D/C ??, DEPENDING ON CLINICAL CONDITION.   Karmel Patricelli M.D on 02/23/2018 at 11:24 AM  Between 7am to 6pm - Pager - [phone] After 6pm go to [email or web address] - password [email or web address] [facility]  Office  [phone]  CC: Primary care physician; Dion Body, MD  Note: This dictation was prepared with Dragon dictation along with smaller phrase technology. Any transcriptional errors that result from this process are unintentional.

## 2018-02-24 ENCOUNTER — Inpatient Hospital Stay: Payer: Medicare Other | Admitting: Anesthesiology

## 2018-02-24 LAB — URINALYSIS, COMPLETE (UACMP) WITH MICROSCOPIC
Bilirubin Urine: NEGATIVE
Glucose, UA: NEGATIVE mg/dL
Ketones, ur: NEGATIVE mg/dL
Nitrite: NEGATIVE
PROTEIN: NEGATIVE mg/dL
SPECIFIC GRAVITY, URINE: 1.008 (ref 1.005–1.030)
SQUAMOUS EPITHELIAL / LPF: NONE SEEN
pH: 5 (ref 5.0–8.0)

## 2018-02-24 LAB — GLUCOSE, CAPILLARY
GLUCOSE-CAPILLARY: 129 mg/dL — AB (ref 65–99)
Glucose-Capillary: 141 mg/dL — ABNORMAL HIGH (ref 65–99)
Glucose-Capillary: 134 mg/dL — ABNORMAL HIGH (ref 65–99)
Glucose-Capillary: 114 mg/dL — ABNORMAL HIGH (ref 65–99)

## 2018-02-24 LAB — PROTIME-INR
INR: 1.56
Prothrombin Time: 18.5 seconds — ABNORMAL HIGH (ref 11.4–15.2)

## 2018-02-24 MED ORDER — LEVALBUTEROL HCL 0.63 MG/3ML IN NEBU
0.6300 mg | INHALATION_SOLUTION | Freq: Four times a day (QID) | RESPIRATORY_TRACT | Status: DC
  Administered 2018-02-24 – 2018-02-25 (×3): 0.63 mg via RESPIRATORY_TRACT
  Filled 2018-02-24 (×3): qty 3

## 2018-02-24 MED ORDER — PRO-STAT SUGAR FREE PO LIQD: 30.0000 mL | Freq: Two times a day (BID) | ORAL | Status: DC

## 2018-02-24 MED ORDER — ROCURONIUM BROMIDE 50 MG/5ML IV SOLN
INTRAVENOUS | Status: AC
  Filled 2018-02-24: qty 1

## 2018-02-24 MED ORDER — FENTANYL BOLUS VIA INFUSION
25.0000 ug | INTRAVENOUS | Status: DC | PRN
  Administered 2018-02-25 – 2018-02-26 (×3): 25 ug via INTRAVENOUS
  Filled 2018-02-24: qty 25

## 2018-02-24 MED ORDER — METOPROLOL TARTRATE 5 MG/5ML IV SOLN
5.0000 mg | INTRAVENOUS | Status: AC
  Administered 2018-02-24: 5 mg via INTRAVENOUS

## 2018-02-24 MED ORDER — DIGOXIN 0.25 MG/ML IJ SOLN
0.2500 mg | Freq: Once | INTRAMUSCULAR | Status: AC
  Administered 2018-02-24: 0.25 mg via INTRAVENOUS
  Filled 2018-02-24: qty 1

## 2018-02-24 MED ORDER — VITAL HIGH PROTEIN PO LIQD: 1000.0000 mL | ORAL | Status: DC

## 2018-02-24 MED ORDER — NOREPINEPHRINE BITARTRATE 1 MG/ML IV SOLN
0.0000 ug/min | INTRAVENOUS | Status: DC
  Filled 2018-02-24: qty 4

## 2018-02-24 MED ORDER — METHYLPREDNISOLONE SODIUM SUCC 125 MG IJ SOLR
125.0000 mg | INTRAMUSCULAR | Status: AC
  Administered 2018-02-24: 125 mg via INTRAVENOUS
  Filled 2018-02-24: qty 2

## 2018-02-24 MED ORDER — FENTANYL CITRATE (PF) 100 MCG/2ML IJ SOLN: 50.0000 ug | Freq: Once | INTRAMUSCULAR | Status: DC

## 2018-02-24 MED ORDER — SUCCINYLCHOLINE CHLORIDE 20 MG/ML IJ SOLN
INTRAMUSCULAR | Status: AC
  Administered 2018-02-24: 100 mg
  Filled 2018-02-24: qty 1

## 2018-02-24 MED ORDER — ENOXAPARIN SODIUM 100 MG/ML ~~LOC~~ SOLN
1.0000 mg/kg | Freq: Two times a day (BID) | SUBCUTANEOUS | Status: DC
Start: 2018-02-24 — End: 2018-02-25
  Administered 2018-02-24 (×2): 95 mg via SUBCUTANEOUS
  Filled 2018-02-24 (×3): qty 1

## 2018-02-24 MED ORDER — FENTANYL 2500MCG IN NS 250ML (10MCG/ML) PREMIX INFUSION
25.0000 ug/h | INTRAVENOUS | Status: DC
  Administered 2018-02-24: 100 ug/h via INTRAVENOUS
  Administered 2018-02-25 (×2): 300 ug/h via INTRAVENOUS
  Administered 2018-02-25: 30 ug/h via INTRAVENOUS
  Administered 2018-02-26 (×3): 300 ug/h via INTRAVENOUS
  Administered 2018-02-27 – 2018-03-01 (×8): 350 ug/h via INTRAVENOUS
  Filled 2018-02-24 (×16): qty 250

## 2018-02-24 MED ORDER — FENTANYL CITRATE (PF) 100 MCG/2ML IJ SOLN
100.0000 ug | Freq: Once | INTRAMUSCULAR | Status: AC
  Administered 2018-02-24: 100 ug via INTRAVENOUS

## 2018-02-24 MED ORDER — FUROSEMIDE 10 MG/ML IJ SOLN
40.0000 mg | INTRAMUSCULAR | Status: AC
  Administered 2018-02-24: 40 mg via INTRAVENOUS
  Filled 2018-02-24: qty 4

## 2018-02-24 MED ORDER — ROCURONIUM BROMIDE 50 MG/5ML IV SOLN: 50.0000 mg | Freq: Once | INTRAVENOUS | Status: DC

## 2018-02-24 MED ORDER — METOPROLOL TARTRATE 5 MG/5ML IV SOLN
5.0000 mg | INTRAVENOUS | Status: DC | PRN
  Administered 2018-02-24 – 2018-02-28 (×12): 5 mg via INTRAVENOUS
  Filled 2018-02-24 (×12): qty 5

## 2018-02-24 MED ORDER — MIDAZOLAM HCL 2 MG/2ML IJ SOLN
1.0000 mg | INTRAMUSCULAR | Status: DC | PRN
  Administered 2018-02-24 – 2018-03-01 (×7): 1 mg via INTRAVENOUS
  Filled 2018-02-24 (×7): qty 2

## 2018-02-24 MED ORDER — METHYLPREDNISOLONE SODIUM SUCC 40 MG IJ SOLR
40.0000 mg | Freq: Two times a day (BID) | INTRAMUSCULAR | Status: DC
  Administered 2018-02-25 – 2018-03-01 (×9): 40 mg via INTRAVENOUS
  Filled 2018-02-24 (×9): qty 1

## 2018-02-24 MED ORDER — ETOMIDATE 2 MG/ML IV SOLN
20.0000 mg | Freq: Once | INTRAVENOUS | Status: AC
  Administered 2018-02-24: 20 mg via INTRAVENOUS

## 2018-02-24 MED ORDER — BUDESONIDE 0.25 MG/2ML IN SUSP
0.2500 mg | Freq: Two times a day (BID) | RESPIRATORY_TRACT | Status: DC
  Administered 2018-02-25 – 2018-03-01 (×9): 0.25 mg via RESPIRATORY_TRACT
  Filled 2018-02-24 (×9): qty 2

## 2018-02-24 MED ORDER — ETOMIDATE 2 MG/ML IV SOLN
INTRAVENOUS | Status: AC
  Administered 2018-02-24: 20 mg via INTRAVENOUS
  Filled 2018-02-24: qty 10

## 2018-02-24 MED ORDER — FENTANYL CITRATE (PF) 100 MCG/2ML IJ SOLN
INTRAMUSCULAR | Status: AC
  Administered 2018-02-24: 100 ug via INTRAVENOUS
  Filled 2018-02-24: qty 2

## 2018-02-24 MED ORDER — MIDAZOLAM HCL 2 MG/2ML IJ SOLN
1.0000 mg | INTRAMUSCULAR | Status: AC | PRN
  Administered 2018-02-24 – 2018-02-25 (×3): 1 mg via INTRAVENOUS
  Filled 2018-02-24 (×3): qty 2

## 2018-02-24 NOTE — Progress Notes (Signed)
CC follow up mental status changes  SUBJECTIVE  More alert.  Comfortable on nasal cannula oxygen.  Off of precedex  Calm but poorly oriented.   On cardizem infusion CPAP last night intermittently   Vitals:   02/24/18 0600 02/24/18 0613 02/24/18 0700 02/24/18 0800  BP: 125/78  128/82 123/76  Pulse: (!) 125 (!) 130 (!) 125 (!) 134  Resp: (!) 33  (!) 29 (!) 26  Temp:    99.4 F (37.4 C)  TempSrc:    Axillary  SpO2: 98%  98% 98%  Weight:      Height:       HEENT: Abrasion and contusion on forehead, periorbital ecchymoses-not significantly changed Neck: Cervical collar in place Chest: Bilateral crackles, no wheezes Cardiac: Regular, mechanical first heart sound, no M Abdomen: Soft, bowel sounds present, nontender Extremities: Warm, no edema Neuro: Cranial nerves intact, no focal deficits, diffusely weak   BMP Latest Ref Rng & Units 02/23/2018 02/22/2018 02/21/2018  Glucose 65 - 99 mg/dL 165(H) 127(H) 150(H)  BUN 6 - 20 mg/dL 17 27(H) 51(H)  Creatinine 0.44 - 1.00 mg/dL 0.64 0.68 1.28(H)  Sodium 135 - 145 mmol/L 149(H) 143 137  Potassium 3.5 - 5.1 mmol/L 4.3 4.0 4.5  Chloride 101 - 111 mmol/L 106 103 99(L)  CO2 22 - 32 mmol/L 30 30 28   Calcium 8.9 - 10.3 mg/dL 8.6(L) 8.5(L) 8.0(L)    CBC Latest Ref Rng & Units 02/23/2018 02/22/2018 02/21/2018  WBC 3.6 - 11.0 K/uL 10.7 11.4(H) 16.4(H)  Hemoglobin 12.0 - 16.0 g/dL 9.2(L) 8.8(L) 8.7(L)  Hematocrit 35.0 - 47.0 % 29.0(L) 29.0(L) 28.0(L)  Platelets 150 - 440 K/uL 269 276 338    CXR: NSC diffuse bilateral symmetric interstitial prominence IMP: Acute nondisplaced cervical spine fracture after fall Acute/chronic hypoxemic respiratory failure Chronic interstitial lung disease Hypotension, unclear etiology-improving 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  Remains poorly oriented but much calmer  PLAN/REC: Continue  supplemental oxygen Continue hydrocortisone as cortisol level was normal Continue to wean phenylephrine for MAP goal >65 mmHg as tolerated Discontinue HCO3 infusion Advance diet-SLP evaluation requested Advance activity-PT evaluation requested She is on full dose enoxaparin for mechanical heart valve.  Once able, transition back to warfarin She will need placement in some kind of rehab/SNF facility Her baseline function is poor and goals of care should be addressed                              - She seems to be a poor candidate for mechanical ventilation or ACLS Plan to wean off cardizem infusion as tolerated Cardiology consulted Swallow eval pending   Remains SD status Plan to transfer to [place], M.D.  [name]  Medical Director [place] Director [facility] Cardio-Pulmonary Department

## 2018-02-24 NOTE — Progress Notes (Signed)
Increased drowsiness this evening compared to this afternoon.  Continues to follow commands. Remains on bipap but when bipap removed for mouth care patient desats down into the 60's.  Heart rate remains elevated on cardizem drip with PRN metoprolol which has been effective.

## 2018-02-24 NOTE — Anesthesia Procedure Notes (Signed)
Procedures

## 2018-02-24 NOTE — Anesthesia Procedure Notes (Addendum)
Procedure Name: Intubation Date/Time: 02/24/2018 11:25 PM Performed by: Emmie Niemann, MD Pre-anesthesia Checklist: Patient identified, Patient being monitored, Timeout performed, Emergency Drugs available and Suction available Patient Re-evaluated:Patient Re-evaluated prior to induction Oxygen Delivery Method: Ambu bag Preoxygenation: Pre-oxygenation with 100% oxygen (via BiPAP) Induction Type: IV induction and Rapid sequence Ventilation: Mask ventilation without difficulty Laryngoscope Size: 3 and Glidescope Grade View: Grade I Tube type: Oral Tube size: 7.5 mm Number of attempts: 1 Airway Equipment and Method: Stylet and Video-laryngoscopy Placement Confirmation: ETT inserted through vocal cords under direct vision,  positive ETCO2,  breath sounds checked- equal and bilateral and CO2 detector Secured at: 20 cm Tube secured with: Tape Dental Injury: Teeth and Oropharynx as per pre-operative assessment  Difficulty Due To: Difficult Airway- due to cervical collar Comments: Planned intubation with video laryngoscope due to cervical collar. Cervical collar maintained in place during intubation. Etomidate and succinylcholine given for induction and relaxation as charted by ICU staff.

## 2018-02-24 NOTE — Progress Notes (Signed)
Called by ICU physician to assist with intubation. Pt in c-collar for cervical spine fracture and concern for difficult intubation. Currently on BiPAP for ventilation assistance but concern for hypercarbic respiratory failure. VS: BP 125/35, P 116, SpO2 94% on BiPAP. Potassium 4.3. Glidescope available at bedside. Etomidate and succinylcholine given for induction and relaxation. Please see procedure note for details of intubation.

## 2018-02-24 NOTE — Progress Notes (Signed)
PT Cancellation Note  Patient Details Name: JUANITTA EARNHARDT MRN: [id number] DOB: 1946/03/09   Cancelled Treatment:    Reason Eval/Treat Not Completed: Patient not medically ready(Treatment session attempted.  Patient HR continues to sustain in 120-130s at rest, currently on BiPAP for respiratory support.  Not appropriate for therapy at this time.  Will hold and continue efforts as medically appropriate.)   Continues to await transfer to [facility] pending bed availability.   Cleopha Indelicato H. Owens Shark, PT, DPT, NCS 02/24/18, 11:02 AM [phone]

## 2018-02-24 NOTE — Consult Note (Signed)
Reason for Consult: Atrial fibrillation rapid ventricular response Referring Physician: Dr. Netty Starring primary Dr. Benjie Karvonen hospitalist Cardiologist [facility]  Betty [name] is an 72 y.o. female.  HPI: Patient 72 year old white female chronic diastolic heart failure vasculitis COPD atrial fibrillation in the past on Coumadin recently had a fall suffered C1-C2 neck fracture followed by neurosurgery.  Patient was recommended hospitalization at [facility] with neck brace patient originally suffered a fall which led to the fracture.  Patient has altered mental status.  Slightly lethargic denies any pain having difficulty controlling heart rate.  Past Medical History:  Diagnosis Date  . Acute diastolic heart failure ([place])   . Allergy   . ANCA-associated vasculitis ([place])   . Asthma   . Atrial fibrillation ([place])   . Backache, unspecified   . Cardiomegaly   . COPD (chronic obstructive pulmonary disease) ([place])   . Diabetes mellitus without complication ([place])   . 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 Esophageal reflux   . Headache(784.0)   . Herpes zoster without mention of complication   . Hx: UTI (urinary tract infection)   . Hypertension    heart controlled w CHF  . Nontoxic uninodular goiter   . Obesity, unspecified   . Osteoarthrosis, unspecified whether generalized or localized, unspecified site   . Unspecified sleep apnea   . Urine incontinence    hx of    Past Surgical History:  Procedure Laterality Date  . ABDOMINAL HYSTERECTOMY  1979   complete (for precancerous cells)  . ABLATION  2011 & 2014  . bladder botox  01/26/2018  . CHOLECYSTECTOMY    . CYSTOSCOPY WITH FULGERATION N/A 01/28/2018   Procedure: [place] AND CLOT EVACUATION;  Surgeon: Hollice Espy, MD;  Location: [facility] ORS;  Service: Urology;  Laterality: N/A;  . Interstim Placement  2012  . OOPHORECTOMY      Family History  Problem Relation Age of Onset  . Heart attack Father   .  Heart failure Father   . Arthritis Father   . Stroke Father   . Hypertension Father   . Coronary artery disease Brother   . Peripheral vascular disease Brother   . Arthritis Mother   . Colon cancer Mother        colon cancer  . Hypertension Mother   . Cancer Maternal Grandmother        colon cancer  . Arthritis Maternal Grandmother     Social History:  reports that she has quit smoking. Her smoking use included cigarettes. She has a 7.50 pack-year smoking history. she has never used smokeless tobacco. She reports that she does not drink alcohol or use drugs.  Allergies:  Allergies  Allergen Reactions  . Flecainide Shortness Of Breath and Other (See Comments)    Reaction: dizziness   . Amiodarone Other (See Comments)    Pt states that this medication causes lung bleeding.      Medications: I have reviewed the patient's current medications.  Results for orders placed or performed during the hospital encounter of 02/19/18 (from the past 48 hour(s))  Glucose, capillary     Status: Abnormal   Collection Time: 02/22/18  3:55 PM  Result Value Ref Range   Glucose-Capillary 147 (H) 65 - 99 mg/dL  Glucose, capillary     Status: Abnormal   Collection Time: 02/22/18  8:06 PM  Result Value Ref Range   Glucose-Capillary 149 (H) 65 - 99 mg/dL  Glucose, capillary  Status: Abnormal   Collection Time: 02/22/18 11:26 PM  Result Value Ref Range   Glucose-Capillary 160 (H) 65 - 99 mg/dL  Protime-INR     Status: Abnormal   Collection Time: 02/23/18 12:53 AM  Result Value Ref Range   Prothrombin Time 20.0 (H) 11.4 - 15.2 seconds   INR 1.72     Comment: Performed at [facility], [address] [place], [place] [address]  Comprehensive metabolic panel     Status: Abnormal   Collection Time: 02/23/18 12:53 AM  Result Value Ref Range   Sodium 149 (H) 135 - 145 mmol/L   Potassium 4.3 3.5 - 5.1 mmol/L   Chloride 106 101 - 111 mmol/L   CO2 30 22 - 32 mmol/L   Glucose, Bld 165  (H) 65 - 99 mg/dL   BUN 17 6 - 20 mg/dL   Creatinine, Ser 0.64 0.44 - 1.00 mg/dL   Calcium 8.6 (L) 8.9 - 10.3 mg/dL   Total Protein 6.9 6.5 - 8.1 g/dL   Albumin 3.5 3.5 - 5.0 g/dL   AST 321 (H) 15 - 41 U/L   ALT 255 (H) 14 - 54 U/L   Alkaline Phosphatase 124 38 - 126 U/L   Total Bilirubin 2.5 (H) 0.3 - 1.2 mg/dL   GFR calc non Af Amer >60 >60 mL/min   GFR calc Af Amer >60 >60 mL/min    Comment: (NOTE) The eGFR has been calculated using the CKD EPI equation. This calculation has not been validated in all clinical situations. eGFR's persistently <60 mL/min signify possible Chronic Kidney Disease.    Anion gap 13 5 - 15    Comment: Performed at [facility], [address] [place], [place] [address]  Magnesium     Status: None   Collection Time: 02/23/18 12:53 AM  Result Value Ref Range   Magnesium 2.3 1.7 - 2.4 mg/dL    Comment: Performed at [facility], [address] [place], [place] [address]  Phosphorus     Status: None   Collection Time: 02/23/18 12:53 AM  Result Value Ref Range   Phosphorus 2.5 2.5 - 4.6 mg/dL    Comment: Performed at [facility], [address] [place], [place] [address]  CBC     Status: Abnormal   Collection Time: 02/23/18 12:53 AM  Result Value Ref Range   WBC 10.7 3.6 - 11.0 K/uL   RBC 3.53 (L) 3.80 - 5.20 MIL/uL   Hemoglobin 9.2 (L) 12.0 - 16.0 g/dL   HCT 29.0 (L) 35.0 - 47.0 %   MCV 82.3 80.0 - 100.0 fL   MCH 26.0 26.0 - 34.0 pg   MCHC 31.5 (L) 32.0 - 36.0 g/dL   RDW 19.9 (H) 11.5 - 14.5 %   Platelets 269 150 - 440 K/uL    Comment: Performed at [facility], [address] [place], [place] [address]  Glucose, capillary     Status: Abnormal   Collection Time: 02/23/18  4:18 AM  Result Value Ref Range   Glucose-Capillary 143 (H) 65 - 99 mg/dL  Glucose, capillary     Status: Abnormal   Collection Time: 02/23/18  7:28 AM  Result Value Ref Range   Glucose-Capillary 154 (H) 65 - 99 mg/dL   Comment 1  Notify RN   Glucose, capillary     Status: Abnormal   Collection Time: 02/23/18 11:13 AM  Result Value Ref Range   Glucose-Capillary 149 (H) 65 - 99 mg/dL  Glucose, capillary     Status: Abnormal   Collection Time: 02/23/18  3:20 PM  Result Value Ref Range   Glucose-Capillary 149 (H) 65 - 99 mg/dL   Comment 1 Notify RN   Glucose, capillary     Status: Abnormal   Collection Time: 02/23/18  7:43 PM  Result Value Ref Range   Glucose-Capillary 134 (H) 65 - 99 mg/dL  Glucose, capillary     Status: Abnormal   Collection Time: 02/23/18 11:54 PM  Result Value Ref Range   Glucose-Capillary 135 (H) 65 - 99 mg/dL  Glucose, capillary     Status: Abnormal   Collection Time: 02/24/18  3:53 AM  Result Value Ref Range   Glucose-Capillary 141 (H) 65 - 99 mg/dL  Protime-INR     Status: Abnormal   Collection Time: 02/24/18  4:51 AM  Result Value Ref Range   Prothrombin Time 18.5 (H) 11.4 - 15.2 seconds   INR 1.56     Comment: Performed at [facility], [address] [place], [place] [address]  Glucose, capillary     Status: Abnormal   Collection Time: 02/24/18  7:31 AM  Result Value Ref Range   Glucose-Capillary 134 (H) 65 - 99 mg/dL  Glucose, capillary     Status: Abnormal   Collection Time: 02/24/18 11:59 AM  Result Value Ref Range   Glucose-Capillary 129 (H) 65 - 99 mg/dL    Dg Chest Port 1 View  Result Date: 02/23/2018 CLINICAL DATA:  COPD EXAM: PORTABLE CHEST 1 VIEW COMPARISON:  02/21/2018 FINDINGS: Cardiac shadow is stable. Postsurgical changes are seen. Pacing device is again noted and stable. Stable right-sided pleural effusion is noted. Persistent interstitial changes are noted particularly in the bases stable from the prior exam. No new focal infiltrate is seen. No bony abnormality is noted. IMPRESSION: No change from the previous exam. Electronically Signed   By: Inez Catalina M.D.   On: 02/23/2018 07:04    Review of Systems  Unable to perform ROS: Mental status change    Blood pressure 111/75, pulse (!) 127, temperature 99.4 F (37.4 C), temperature source Axillary, resp. rate (!) 25, height _0  (1.753 m), weight 213 lb 13.5 oz (97 kg), SpO2 98 %. Physical Exam  Nursing note and vitals reviewed. Constitutional: She appears well-developed and well-nourished. She appears lethargic.  HENT:  Head: Normocephalic and atraumatic.  Eyes: Conjunctivae and EOM are normal. Pupils are equal, round, and reactive to light.  Neck: Normal range of motion. Neck supple.  Cardiovascular: S1 normal, S2 normal and normal pulses. An irregularly irregular rhythm present. Tachycardia present. PMI is displaced.  Murmur heard. Respiratory: She has decreased breath sounds. She has rhonchi.  GI: Soft. Bowel sounds are normal.  Musculoskeletal: Normal range of motion.  Neurological: She appears lethargic.  Skin: Skin is warm and dry.  Psychiatric: She has a normal mood and affect.    Assessment/Plan: Atrial fibrillation rapid ventricular response Acute on chronic renal insufficiency Hyponatremia Vasculitis ANCA Diabetes Hypertension Hyperlipidemia COPD Cervical fracture  C1/C2 . Agree with ICU level care Continue diltiazem IV for rate control Continue IV doses of metoprolol increase the frequency for better rate control Consider adding digoxin with renal insufficiency will hold off Consider amiodarone therapy for rhythm control Anticoagulation with Coumadin continue Continue therapy for vasculitis with Imuran Diabetes management control recommend insulin therapy Advised patient to refrain from smoking  Dwayne D Callwood 02/24/2018, 12:25 PM

## 2018-02-24 NOTE — Progress Notes (Signed)
RN spoke with Dr. Clayborn Bigness about patient's elevated heart rate while on cardizem drip and patient remains on bipap.  MD gave order to change metoprolol PRN to q2H 5 mg PRN IV.

## 2018-02-24 NOTE — Progress Notes (Signed)
[facility] NAME: Betty [name]    MR#:  [id number]  DATE OF BIRTH:  [birth year]  SUBJECTIVE:    Patient seen earlier this morning. She continues to have elevated heart rates.    Review of Systems  Constitutional: Negative.  Negative for chills, fever and malaise/fatigue.  HENT: Negative.  Negative for ear discharge, ear pain, hearing loss, nosebleeds and sore throat.   Eyes: Negative.  Negative for blurred vision and pain.  Respiratory: Positive for shortness of breath. Negative for cough, hemoptysis and wheezing.   Cardiovascular: Negative.  Negative for chest pain, palpitations and leg swelling.  Gastrointestinal: Negative.  Negative for abdominal pain, blood in stool, diarrhea, nausea and vomiting.  Genitourinary: Negative.  Negative for dysuria.  Musculoskeletal: Positive for falls and neck pain. Negative for back pain.  Skin: Negative.   Neurological: Negative for dizziness, tremors, speech change, focal weakness, seizures and headaches.  Endo/Heme/Allergies: Negative.  Does not bruise/bleed easily.  Psychiatric/Behavioral: Positive for memory loss. Negative for depression, hallucinations and suicidal ideas.     Tolerating Diet: yes     DRUG ALLERGIES:   Allergies  Allergen Reactions  . Flecainide Shortness Of Breath and Other (See Comments)    Reaction: dizziness   . Amiodarone Other (See Comments)    Pt states that this medication causes lung bleeding.      VITALS:  Blood pressure 111/75, pulse (!) 127, temperature 99.4 F (37.4 C), temperature source Axillary, resp. rate (!) 25, height 5\' 9"  (1.753 m), weight 97 kg (213 lb 13.5 oz), SpO2 98 %.  PHYSICAL EXAMINATION:  Constitutional: Patient appears well-developed wearing cervical collar  HENT: Normocephalic. Marland Kitchen Oropharynx is clear and moist.  Eyes: PERRLA, no scleral icterus.  Neck: Cervical collar placed  CVS: Tachycardia S1/S2 +, no murmurs, no gallops, no  carotid bruit.  Pulmonary: decreased breath sounds right base  Abdominal: Soft. BS +,  no distension, tenderness, rebound or guarding.  Musculoskeletal: Normal range of motion. No edema and no tenderness.  Neuro: Following commands no focal deficit  Skin: Skin is warm and dry. No rash noted. Psychiatric: Alert   LABORATORY PANEL:   CBC Recent Labs  Lab 02/23/18 0053  WBC 10.7  HGB 9.2*  HCT 29.0*  PLT 269   ------------------------------------------------------------------------------------------------------------------  Chemistries  Recent Labs  Lab 02/23/18 0053  NA 149*  K 4.3  CL 106  CO2 30  GLUCOSE 165*  BUN 17  CREATININE 0.64  CALCIUM 8.6*  MG 2.3  AST 321*  ALT 255*  ALKPHOS 124  BILITOT 2.5*   ------------------------------------------------------------------------------------------------------------------  Cardiac Enzymes Recent Labs  Lab 02/19/18 0709  TROPONINI <0.03   ------------------------------------------------------------------------------------------------------------------  RADIOLOGY:  Dg Chest Port 1 View  Result Date: 02/23/2018 CLINICAL DATA:  COPD EXAM: PORTABLE CHEST 1 VIEW COMPARISON:  02/21/2018 FINDINGS: Cardiac shadow is stable. Postsurgical changes are seen. Pacing device is again noted and stable. Stable right-sided pleural effusion is noted. Persistent interstitial changes are noted particularly in the bases stable from the prior exam. No new focal infiltrate is seen. No bony abnormality is noted. IMPRESSION: No change from the previous exam. Electronically Signed   By: Inez Catalina M.D.   On: 02/23/2018 07:04     ASSESSMENT AND PLAN:   72 year old female status post mechanical fall who suffered C1/C2 fracture and now in the ICU for acute encephalopathy with hypotension  1. Acute hypoxic/hypercapnic respiratory failure due to sedating medications and acute on chronic diastolic heart  failure Wean oxygen to room air as  tolerated  2. Acute encephalopathy in the setting of problem #1 and pain medications Patient with improvement  3. Hypotension, unclear etiology: Weaned off of neo  4. C1/C2 fracture after mechanical fall: Neurosurgery is recommending inpatient CTA to rule out vertebral artery injury Patient unstable this time Continue neck brace Patient will need this neck brace for at least  3 months She will follow up with neurosurgery in 2-4 weeks.  5. Acute kidney injury: Creatinine has improved 6. Hyperkalemia: This has resolved 7. Leukocytosis of unclear etiology This has improved  UA and chest x-ray do not show evidence of pneumonia  8. Vasculitis Patient on Imuran  9. Diabetes: Sliding scale insulin  10. PAF: Currently on diltiazem drip due to rapid ventricular rate Wean as tolerated Continue Rythmol and when necessary metoprolol Cont Lovenox  11. Acute on chronic diastolic heart failure: Lasix 20 mg IV 1 given  12. Elevated LFTs: This could be due to hepatic congestion from CHF Patient not complaining of abdominal pain  13. Hypernatremia due to poor by mouth intake Patient would benefit from D5W BMP in a.m.      Family requesting transfer to [facility] Awaiting bed    CODE STATUS: FULL  TOTAL TIME TAKING CARE OF THIS PATIENT: 20 minutes.     POSSIBLE D/C ??, DEPENDING ON CLINICAL CONDITION.   Lio Wehrly M.D on 02/24/2018 at 12:25 PM  Between 7am to 6pm - Pager - [phone] After 6pm go to [email or web address] - password [email or web address] [facility]  Office  [phone]  CC: Primary care physician; Dion Body, MD  Note: This dictation was prepared with Dragon dictation along with smaller phrase technology. Any transcriptional errors that result from this process are unintentional.

## 2018-02-24 NOTE — Progress Notes (Signed)
SLP Cancellation Note  Patient Details Name: Betty [name] MRN: [id number] DOB: 1946-07-17   Cancelled treatment:       Reason Eval/Treat Not Completed: Medical issues which prohibited therapy;Patient not medically ready(chart reviewed; consulted NSG re: pt's status). Pt currently remains on BiPAP - increased O2 support this morning.  ST services will f/u w/ MD re: options/timing to assess pt's swallow function. NSG agreed.     Orinda Kenner, MS, CCC-SLP Lumir Demetriou 02/24/2018, 2:14 PM

## 2018-02-25 ENCOUNTER — Inpatient Hospital Stay: Payer: Medicare Other

## 2018-02-25 DIAGNOSIS — J441 Chronic obstructive pulmonary disease with (acute) exacerbation: Secondary | ICD-10-CM

## 2018-02-25 LAB — CBC WITH DIFFERENTIAL/PLATELET
BASOS ABS: 0 10*3/uL (ref 0–0.1)
Basophils Absolute: 0 10*3/uL (ref 0–0.1)
Basophils Relative: 0 %
Basophils Relative: 0 %
EOS PCT: 0 %
Eosinophils Absolute: 0 10*3/uL (ref 0–0.7)
Eosinophils Absolute: 0 10*3/uL (ref 0–0.7)
Eosinophils Relative: 0 %
HCT: 26.5 % — ABNORMAL LOW (ref 35.0–47.0)
HEMATOCRIT: 29.3 % — AB (ref 35.0–47.0)
HEMOGLOBIN: 8.6 g/dL — AB (ref 12.0–16.0)
Hemoglobin: 7.9 g/dL — ABNORMAL LOW (ref 12.0–16.0)
LYMPHS ABS: 0.1 10*3/uL — AB (ref 1.0–3.6)
LYMPHS PCT: 1 %
LYMPHS PCT: 2 %
Lymphs Abs: 0.1 10*3/uL — ABNORMAL LOW (ref 1.0–3.6)
MCH: 25.1 pg — AB (ref 26.0–34.0)
MCH: 25.3 pg — AB (ref 26.0–34.0)
MCHC: 29.5 g/dL — AB (ref 32.0–36.0)
MCHC: 29.7 g/dL — AB (ref 32.0–36.0)
MCV: 85.1 fL (ref 80.0–100.0)
MCV: 85.3 fL (ref 80.0–100.0)
MONOS PCT: 2 %
MONOS PCT: 14 %
Monocytes Absolute: 0.2 10*3/uL (ref 0.2–0.9)
Monocytes Absolute: 1.2 10*3/uL — ABNORMAL HIGH (ref 0.2–0.9)
NEUTROS ABS: 6.8 10*3/uL — AB (ref 1.4–6.5)
NEUTROS PCT: 97 %
Neutro Abs: 7.1 10*3/uL — ABNORMAL HIGH (ref 1.4–6.5)
Neutrophils Relative %: 84 %
PLATELETS: 291 10*3/uL (ref 150–440)
Platelets: 275 10*3/uL (ref 150–440)
RBC: 3.44 MIL/uL — AB (ref 3.80–5.20)
RBC: 3.1 MIL/uL — ABNORMAL LOW (ref 3.80–5.20)
RDW: 20.6 % — ABNORMAL HIGH (ref 11.5–14.5)
RDW: 20.9 % — AB (ref 11.5–14.5)
WBC: 7 10*3/uL (ref 3.6–11.0)
WBC: 8.5 10*3/uL (ref 3.6–11.0)

## 2018-02-25 LAB — GLUCOSE, CAPILLARY
GLUCOSE-CAPILLARY: 131 mg/dL — AB (ref 65–99)
GLUCOSE-CAPILLARY: 163 mg/dL — AB (ref 65–99)
GLUCOSE-CAPILLARY: 153 mg/dL — AB (ref 65–99)
GLUCOSE-CAPILLARY: 148 mg/dL — AB (ref 65–99)
Glucose-Capillary: 146 mg/dL — ABNORMAL HIGH (ref 65–99)
Glucose-Capillary: 147 mg/dL — ABNORMAL HIGH (ref 65–99)
Glucose-Capillary: 149 mg/dL — ABNORMAL HIGH (ref 65–99)
Glucose-Capillary: 162 mg/dL — ABNORMAL HIGH (ref 65–99)

## 2018-02-25 LAB — BASIC METABOLIC PANEL
Anion gap: 5 (ref 5–15)
BUN: 27 mg/dL — AB (ref 6–20)
CHLORIDE: 115 mmol/L — AB (ref 101–111)
CO2: 36 mmol/L — AB (ref 22–32)
CREATININE: 0.88 mg/dL (ref 0.44–1.00)
Calcium: 8.3 mg/dL — ABNORMAL LOW (ref 8.9–10.3)
GFR calc Af Amer: >60 mL/min (ref >60)
GFR calc non Af Amer: >60 mL/min (ref >60)
Glucose, Bld: 175 mg/dL — ABNORMAL HIGH (ref 65–99)
POTASSIUM: 4.3 mmol/L (ref 3.5–5.1)
SODIUM: 156 mmol/L — AB (ref 135–145)

## 2018-02-25 LAB — PROCALCITONIN

## 2018-02-25 LAB — PHOSPHORUS
Phosphorus: 3.8 mg/dL (ref 2.5–4.6)
Phosphorus: 4 mg/dL (ref 2.5–4.6)

## 2018-02-25 LAB — MAGNESIUM
MAGNESIUM: 2.4 mg/dL (ref 1.7–2.4)
Magnesium: 2.6 mg/dL — ABNORMAL HIGH (ref 1.7–2.4)

## 2018-02-25 LAB — HEMOGLOBIN AND HEMATOCRIT, BLOOD
HCT: 27.2 % — ABNORMAL LOW (ref 35.0–47.0)
HEMOGLOBIN: 8.1 g/dL — AB (ref 12.0–16.0)

## 2018-02-25 LAB — PROTIME-INR
INR: 1.58
Prothrombin Time: 18.7 seconds — ABNORMAL HIGH (ref 11.4–15.2)

## 2018-02-25 MED ORDER — PROPAFENONE HCL 225 MG PO TABS
225.0000 mg | ORAL_TABLET | Freq: Two times a day (BID) | ORAL | Status: DC
  Administered 2018-02-27: 225 mg
  Filled 2018-02-25 (×4): qty 1

## 2018-02-25 MED ORDER — SENNOSIDES 8.8 MG/5ML PO SYRP
5.0000 mL | ORAL_SOLUTION | Freq: Two times a day (BID) | ORAL | Status: DC
  Filled 2018-02-25 (×6): qty 5

## 2018-02-25 MED ORDER — PROTAMINE SULFATE 10 MG/ML IV SOLN
50.0000 mg | INTRAVENOUS | Status: AC
  Administered 2018-02-25: 50 mg via INTRAVENOUS
  Filled 2018-02-25: qty 5

## 2018-02-25 MED ORDER — DOCUSATE SODIUM 50 MG/5ML PO LIQD
100.0000 mg | Freq: Every day | ORAL | Status: DC
  Filled 2018-02-25: qty 10

## 2018-02-25 MED ORDER — IPRATROPIUM-ALBUTEROL 0.5-2.5 (3) MG/3ML IN SOLN
3.0000 mL | RESPIRATORY_TRACT | Status: DC
  Administered 2018-02-25 – 2018-02-26 (×6): 3 mL via RESPIRATORY_TRACT
  Filled 2018-02-25 (×5): qty 3

## 2018-02-25 MED ORDER — CALCIUM CARBONATE-VITAMIN D 500-200 MG-UNIT PO TABS
1.0000 | ORAL_TABLET | Freq: Every day | ORAL | Status: DC
  Filled 2018-02-25: qty 1

## 2018-02-25 MED ORDER — OXYMETAZOLINE HCL 0.05 % NA SOLN
1.0000 | NASAL | Status: AC
  Administered 2018-02-25: 1 via NASAL
  Filled 2018-02-25: qty 15

## 2018-02-25 MED ORDER — OXYBUTYNIN CHLORIDE 5 MG PO TABS
5.0000 mg | ORAL_TABLET | Freq: Three times a day (TID) | ORAL | Status: DC
  Filled 2018-02-25 (×6): qty 1

## 2018-02-25 MED ORDER — CHLORHEXIDINE GLUCONATE 0.12% ORAL RINSE (MEDLINE KIT)
15.0000 mL | Freq: Two times a day (BID) | OROMUCOSAL | Status: DC
  Administered 2018-02-25 – 2018-03-01 (×8): 15 mL via OROMUCOSAL

## 2018-02-25 MED ORDER — LEVALBUTEROL HCL 0.63 MG/3ML IN NEBU: 0.6300 mg | INHALATION_SOLUTION | Freq: Four times a day (QID) | RESPIRATORY_TRACT | Status: DC | PRN

## 2018-02-25 MED ORDER — DOCUSATE SODIUM 50 MG/5ML PO LIQD: 100.0000 mg | Freq: Every day | ORAL | Status: DC

## 2018-02-25 MED ORDER — SODIUM CHLORIDE 0.9 % IV SOLN
1.5000 g | Freq: Four times a day (QID) | INTRAVENOUS | Status: DC
  Administered 2018-02-25 – 2018-03-01 (×16): 1.5 g via INTRAVENOUS
  Filled 2018-02-25 (×20): qty 1.5

## 2018-02-25 MED ORDER — ORAL CARE MOUTH RINSE
15.0000 mL | OROMUCOSAL | Status: DC
  Administered 2018-02-25 – 2018-03-01 (×38): 15 mL via OROMUCOSAL

## 2018-02-25 NOTE — Consult Note (Signed)
Betty [name], Betty [name] December 30, 1945 Betty Nearing, MD  Reason for Consult: Epistaxis Requesting Physician: Bettey Costa, MD Consulting Physician: Betty Nearing, MD  HPI: This 72 y.o. year old female was admitted on 02/19/2018 for Hyponatremia [E87.1] Supratherapeutic INR [R79.1] Hangman's fracture, closed, initial encounter ([place]) [S12.130A] Acute renal failure, unspecified acute renal failure type ([place]) [N17.9] Closed displaced fracture of first cervical vertebra, unspecified fracture morphology, initial encounter ([place]) [S12.000A]. Patient had attempt at nasogastric tube last nigth resulting in bleeding from the left nares. Rhino-rocket pack was placed and had some breakthrough oozing, which has now settled down. BP has been OK, certainly not high. She is on anticoagulation for A-fib.  Medications:  Current Facility-Administered Medications  Medication Dose Route Frequency Provider Last Rate Last Dose  . acetaminophen (TYLENOL) tablet 650 mg  650 mg Oral Q6H PRN Bettey Costa, MD       Or  . acetaminophen (TYLENOL) suppository 650 mg  650 mg Rectal Q6H PRN Bettey Costa, MD   650 mg at 02/24/18 1918  . azaTHIOprine (IMURAN) tablet 100 mg  100 mg Oral Daily Mody, Sital, MD   100 mg at 02/19/18 1144  . budesonide (PULMICORT) nebulizer solution 0.25 mg  0.25 mg Nebulization BID Awilda Bill, NP      . calcium-vitamin D (OSCAL WITH D) 500-200 MG-UNIT per tablet 1 tablet  1 tablet Per Tube Daily Awilda Bill, NP      . chlorhexidine gluconate (MEDLINE KIT) (PERIDEX) 0.12 % solution 15 mL  15 mL Mouth Rinse BID Awilda Bill, NP      . diltiazem (CARDIZEM) 100 mg in dextrose 5 % 100 mL (1 mg/mL) infusion  5-15 mg/hr Intravenous Titrated Kipp Brood, MD 15 mL/hr at 02/25/18 0802 15 mg/hr at 02/25/18 0802  . docusate (COLACE) 50 MG/5ML liquid 100 mg  100 mg Per Tube Daily Awilda Bill, NP      . enoxaparin (LOVENOX) injection 95 mg  1 mg/kg Subcutaneous Q12H Bettey Costa, MD   95 mg at  02/24/18 2134  . fentaNYL (SUBLIMAZE) bolus via infusion 25 mcg  25 mcg Intravenous Q1H PRN Awilda Bill, NP      . fentaNYL (SUBLIMAZE) injection 50 mcg  50 mcg Intravenous Once Awilda Bill, NP      . fentaNYL 2518mg in NS 2557m(1074mml) infusion-PREMIX  25-400 mcg/hr Intravenous Continuous BlaAwilda BillP 30 mL/hr at 02/25/18 0711 300 mcg/hr at 02/25/18 0711  . insulin aspart (novoLOG) injection 0-15 Units  0-15 Units Subcutaneous Q4H TukMikael SprayP   2 Units at 02/25/18 0456  . levalbuterol (XOPENEX) nebulizer solution 0.63 mg  0.63 mg Nebulization Q6H BlaAwilda BillP   0.63 mg at 02/25/18 0407  . levalbuterol (XOPENEX) nebulizer solution 0.63 mg  0.63 mg Nebulization Q6H PRN BlaAwilda BillP      . MEDLINE mouth rinse  15 mL Mouth Rinse 10 times per day BlaAwilda BillP   15 mL at 02/25/18 0500  . methylPREDNISolone sodium succinate (SOLU-MEDROL) 40 mg/mL injection 40 mg  40 mg Intravenous Q12H BlaAwilda BillP      . metoprolol tartrate (LOPRESSOR) injection 5 mg  5 mg Intravenous Q2H PRN Callwood, Dwayne D, MD   5 mg at 02/25/18 0532  . midazolam (VERSED) injection 1 mg  1 mg Intravenous Q15 min PRN BlaAwilda BillP   1 mg at 02/24/18 2354  . midazolam (VERSED) injection 1 mg  1 mg Intravenous Q2H PRN Awilda Bill, NP   1 mg at 02/24/18 2335  . ondansetron (ZOFRAN) injection 4 mg  4 mg Intravenous Q6H PRN Mody, Sital, MD      . opium-belladonna (B&O SUPPRETTES) 16.2-60 MG suppository 1 suppository  1 suppository Rectal Q6H PRN Mody, Sital, MD      . oxybutynin (DITROPAN) tablet 5 mg  5 mg Per Tube TID Awilda Bill, NP      . pantoprazole (PROTONIX) injection 40 mg  40 mg Intravenous Q24H Flora Lipps, MD   40 mg at 02/24/18 1229  . polyethylene glycol (MIRALAX / GLYCOLAX) packet 17 g  17 g Oral Daily PRN Mody, Sital, MD      . propafenone (RYTHMOL) tablet 225 mg  225 mg Per Tube Q12H Awilda Bill, NP      . rocuronium (ZEMURON) 50  MG/5ML injection           . rocuronium (ZEMURON) injection 50 mg  50 mg Intravenous Once Awilda Bill, NP      . sennosides (SENOKOT) 8.8 MG/5ML syrup 5 mL  5 mL Per Tube BID Awilda Bill, NP      .  Medications Prior to Admission  Medication Sig Dispense Refill  . acetaminophen (TYLENOL) 325 MG tablet Take 2 tablets (650 mg total) by mouth every 6 (six) hours as needed for mild pain (or Fever >/= 101). 30 tablet 0  . albuterol (PROVENTIL HFA;VENTOLIN HFA) 108 (90 Base) MCG/ACT inhaler Inhale 2 puffs into the lungs every 4 (four) hours as needed for wheezing or shortness of breath. 1 Inhaler 10  . albuterol (PROVENTIL) (2.5 MG/3ML) 0.083% nebulizer solution Take 3 mLs (2.5 mg total) by nebulization every 4 (four) hours as needed for wheezing or shortness of breath. 75 mL 12  . atorvastatin (LIPITOR) 20 MG tablet Take 20 mg by mouth daily.    Marland Kitchen azaTHIOprine (IMURAN) 50 MG tablet Take 100 mg by mouth daily.     . calcium-vitamin D (OSCAL WITH D) 500-200 MG-UNIT per tablet Take 1 tablet by mouth daily.    Marland Kitchen diltiazem (CARDIZEM CD) 180 MG 24 hr capsule Take 180 mg by mouth daily.  3  . diphenhydrAMINE (BENADRYL) 25 mg capsule Take 25 mg by mouth every 6 (six) hours as needed.    . ferrous sulfate 325 (65 FE) MG EC tablet Take 325 mg by mouth daily.   11  . fluticasone (FLONASE) 50 MCG/ACT nasal spray Place 2 sprays into the nose daily.     Marland Kitchen gabapentin (NEURONTIN) 600 MG tablet Take 600 mg by mouth 2 (two) times daily.    Marland Kitchen guaiFENesin-dextromethorphan (ROBITUSSIN DM) 100-10 MG/5ML syrup Take 5 mLs by mouth every 4 (four) hours as needed for cough. 118 mL 0  . Lancets 30G MISC Use 1 Units as directed. Check CBG's fasting once daily. Dx: E11.9    . loratadine (CLARITIN) 10 MG tablet Take 10 mg by mouth daily.  11  . magic mouthwash SOLN Take 10 mLs by mouth every 6 (six) hours as needed for mouth pain.    . magnesium oxide (MAG-OX) 400 (241.3 Mg) MG tablet TAKE 1 TABLET (400 MG TOTAL) BY  MOUTH ONCE DAILY.  3  . Melatonin 5 MG TABS Take 5 mg by mouth at bedtime as needed.    . metFORMIN (GLUCOPHAGE) 1000 MG tablet Take 1,000 mg by mouth 2 (two) times daily with a meal.     . metolazone (  ZAROXOLYN) 2.5 MG tablet Take 2.5 mg by mouth 2 (two) times a week. Take 2.5 mg by mouth on Monday and Thursday.    . metoprolol succinate (TOPROL-XL) 25 MG 24 hr tablet Take 25 mg by mouth 2 (two) times daily.   3  . opium-belladonna (B&O SUPPRETTES) 16.2-60 MG suppository Place 1 suppository rectally every 6 (six) hours as needed for bladder spasms. 30 suppository 0  . oxybutynin (DITROPAN) 5 MG tablet Take 5 mg by mouth 3 (three) times daily.     Marland Kitchen oxyCODONE (OXY IR/ROXICODONE) 5 MG immediate release tablet Take 5 mg by mouth every 4 (four) hours as needed.    . pantoprazole (PROTONIX) 40 MG tablet Take 40 mg by mouth daily.    . potassium chloride SA (K-DUR,KLOR-CON) 20 MEQ tablet Take 1 tablet (20 mEq total) by mouth 2 (two) times daily. (Patient taking differently: Take 40 mEq by mouth 2 (two) times daily. ) 60 tablet 0  . propafenone (RYTHMOL) 225 MG tablet Take 225 mg by mouth every 12 (twelve) hours.    Marland Kitchen rOPINIRole (REQUIP) 4 MG tablet Take 4 mg by mouth at bedtime.  3  . senna-docusate (SENNA-PLUS) 8.6-50 MG tablet Take 2 tablets by mouth 2 (two) times daily.     Marland Kitchen spironolactone (ALDACTONE) 25 MG tablet Take 25 mg by mouth daily.    Marland Kitchen torsemide (DEMADEX) 20 MG tablet Take 80 mg by mouth 2 (two) times daily.     Marland Kitchen venlafaxine (EFFEXOR) 75 MG tablet Take 75 mg by mouth 3 (three) times daily.  3  . Vitamin D, Ergocalciferol, (DRISDOL) 50000 units CAPS capsule Take 50,000 Units by mouth every 7 (seven) days. Mondays only    . enoxaparin (LOVENOX) 100 MG/ML injection Inject 0.95 mLs (95 mg total) into the skin every 12 (twelve) hours. X 3-4 days until INR reaches goal of 2.5 (Patient not taking: Reported on 02/19/2018) 5 mL 0  . traZODone (DESYREL) 150 MG tablet Take 1 tablet (150 mg total) by  mouth at bedtime as needed for sleep. (Patient not taking: Reported on 02/19/2018) 30 tablet 0  . warfarin (COUMADIN) 6 MG tablet Take 6 mg by mouth daily at 6 PM.       Allergies:  Allergies  Allergen Reactions  . Flecainide Shortness Of Breath and Other (See Comments)    Reaction: dizziness   . Amiodarone Other (See Comments)    Pt states that this medication causes lung bleeding.      PMH:  Past Medical History:  Diagnosis Date  . Acute diastolic heart failure ([place])   . Allergy   . 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 Asthma   . Atrial fibrillation ([place])   . Backache, unspecified   . Cardiomegaly   . COPD (chronic obstructive pulmonary disease) ([place])   . Diabetes mellitus without complication ([place])   . 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 Esophageal reflux   . Headache(784.0)   . Herpes zoster without mention of complication   . Hx: UTI (urinary tract infection)   . Hypertension    heart controlled w CHF  . Nontoxic uninodular goiter   . Obesity, unspecified   . Osteoarthrosis, unspecified whether generalized or localized, unspecified site   . Unspecified sleep apnea   . Urine incontinence    hx of    Fam Hx:  Family History  Problem Relation Age of Onset  . Heart attack Father   . Heart failure  Father   . Arthritis Father   . Stroke Father   . Hypertension Father   . Coronary artery disease Brother   . Peripheral vascular disease Brother   . Arthritis Mother   . Colon cancer Mother        colon cancer  . Hypertension Mother   . Cancer Maternal Grandmother        colon cancer  . Arthritis Maternal Grandmother     Soc Hx:  Social History   Socioeconomic History  . Marital status: Widowed    Spouse name: Not on file  . Number of children: 1  . Years of education: Not on file  . Highest education level: Not on file  Social Needs  . Financial resource strain: Not on file  . Food insecurity - worry: Not on file  . Food  insecurity - inability: Not on file  . Transportation needs - medical: Not on file  . Transportation needs - non-medical: Not on file  Occupational History    Employer: [employer]  Tobacco Use  . Smoking status: Former Smoker    Packs/day: 0.50    Years: 15.00    Pack years: 7.50    Types: Cigarettes  . Smokeless tobacco: Never Used  . Tobacco comment: Has a 20-pack-year history, qutting in 1970.   Substance and Sexual Activity  . Alcohol use: No    Alcohol/week: 0.0 oz  . Drug use: No  . Sexual activity: Not Currently  Other Topics Concern  . Not on file  Social History Narrative   Lives in [place] with her husband. 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 Does not routinely exercise.     PSH:  Past Surgical History:  Procedure Laterality Date  . ABDOMINAL HYSTERECTOMY  1979   complete (for precancerous cells)  . ABLATION  2011 & 2014  . bladder botox  01/26/2018  . CHOLECYSTECTOMY    . CYSTOSCOPY WITH FULGERATION N/A 01/28/2018   Procedure: [place] AND CLOT EVACUATION;  Surgeon: Hollice Espy, MD;  Location: [facility] ORS;  Service: Urology;  Laterality: N/A;  . Interstim Placement  2012  . OOPHORECTOMY    . Procedures since admission: No admission procedures for hospital encounter.  ROS: Review of systems normal other than 12 systems except per HPI.  PHYSICAL EXAM  Vitals: Blood pressure 112/73, pulse (!) 111, temperature 98.2 F (36.8 C), temperature source Axillary, resp. rate (!) 21, height _0  (1.753 m), weight 212 lb 4.9 oz (96.3 kg), SpO2 97 %.. General: Obese patient, intubated and sedated Mood: Intubated and sedated. Orientation: Intubated and sedated. Vocal Quality: Intubated and sedated. head and Face: Facial echhymosis with forehead abrasion. No facial asymmetry. No visible skin lesions. No significant facial scars. No tenderness with sinus percussion. Facial strength cannot be  assessed Ears: External ears with normal landmarks, no lesions. External auditory canals free of infection, cerumen impaction or lesions. Tympanic membranes intact with good landmarks and normal mobility on pneumatic otoscopy. No middle ear effusion. Hearing: Intubated and sedated.Cannot assess Nose: External nose normal with midline dorsum and no lesions or deformity. Nasal Cavity reveals some clot in the right nares, but no active bleeding. Rhino-rocket pack in left nares with no bleeding around the pack. Oral Cavity/ Oropharynx: Lips are normal with no lesions. Exam limited due to ET Tube. Some old clot suctioned, but no active bleeding seen in posterior pharynx. Indirect Laryngoscopy/Nasopharyngoscopy: Visualization of the larynx, hypopharynx  and nasopharynx is not possible in this setting with routine examination. Neck: Cannot assess. Has C-collar for neck fracture. Lymphatic: Cannot assess. Has C-collar for neck fracture. Respiratory: On vent. Cardiovascular: Regular rate and rhythm Neurologic: Intubated and sedated. Cannot assess. Eyes: Intubated and sedated. Cannot assess.  MEDICAL DECISION MAKING: Data Review:  Results for orders placed or performed during the hospital encounter of 02/19/18 (from the past 48 hour(s))  Glucose, capillary     Status: Abnormal   Collection Time: 02/23/18 11:13 AM  Result Value Ref Range   Glucose-Capillary 149 (H) 65 - 99 mg/dL  Glucose, capillary     Status: Abnormal   Collection Time: 02/23/18  3:20 PM  Result Value Ref Range   Glucose-Capillary 149 (H) 65 - 99 mg/dL   Comment 1 Notify RN   Glucose, capillary     Status: Abnormal   Collection Time: 02/23/18  7:43 PM  Result Value Ref Range   Glucose-Capillary 134 (H) 65 - 99 mg/dL  Glucose, capillary     Status: Abnormal   Collection Time: 02/23/18 11:54 PM  Result Value Ref Range   Glucose-Capillary 135 (H) 65 - 99 mg/dL  Glucose, capillary     Status: Abnormal   Collection Time: 02/24/18   3:53 AM  Result Value Ref Range   Glucose-Capillary 141 (H) 65 - 99 mg/dL  Protime-INR     Status: Abnormal   Collection Time: 02/24/18  4:51 AM  Result Value Ref Range   Prothrombin Time 18.5 (H) 11.4 - 15.2 seconds   INR 1.56     Comment: Performed at [facility], [address] [place], [place] [address]  Glucose, capillary     Status: Abnormal   Collection Time: 02/24/18  7:31 AM  Result Value Ref Range   Glucose-Capillary 134 (H) 65 - 99 mg/dL  Glucose, capillary     Status: Abnormal   Collection Time: 02/24/18 11:59 AM  Result Value Ref Range   Glucose-Capillary 129 (H) 65 - 99 mg/dL  Glucose, capillary     Status: Abnormal   Collection Time: 02/24/18  4:03 PM  Result Value Ref Range   Glucose-Capillary 131 (H) 65 - 99 mg/dL  Glucose, capillary     Status: Abnormal   Collection Time: 02/24/18  7:45 PM  Result Value Ref Range   Glucose-Capillary 114 (H) 65 - 99 mg/dL  Blood gas, arterial     Status: Abnormal (Preliminary result)   Collection Time: 02/24/18  8:14 PM  Result Value Ref Range   FIO2 40.00    Delivery systems BILEVEL POSITIVE AIRWAY PRESSURE    Inspiratory PAP 12.0    Expiratory PAP 6.0    pH, Arterial 7.39 7.350 - 7.450   pCO2 arterial 73 (HH) 32.0 - 48.0 mmHg    Comment: CRITICAL RESULT CALLED TO, READ BACK BY AND VERIFIED WITH: Clyde Canterbury NP AT [phone] BY SMATHEWRRT    pO2, Arterial PENDING 83.0 - 108.0 mmHg   Bicarbonate 44.2 (H) 20.0 - 28.0 mmol/L   Acid-Base Excess 16.1 (H) 0.0 - 2.0 mmol/L   O2 Saturation PENDING %   Patient temperature 37.0    Oxygen index PENDING    Collection site RIGHT RADIAL    Betty type ARTERIAL DRAW    Allens test (pass/fail) PASS PASS    Comment: Performed at [facility], [address] [place], [place] [address]  Urinalysis, Complete w Microscopic     Status: Abnormal   Collection Time: 02/24/18 10:45 PM  Result Value Ref Range   Color, Urine YELLOW (A) YELLOW   APPearance CLOUDY  (A) CLEAR   Specific Gravity, Urine 1.008 1.005 - 1.030   pH 5.0 5.0 - 8.0   Glucose, UA NEGATIVE NEGATIVE mg/dL   Hgb urine dipstick SMALL (A) NEGATIVE   Bilirubin Urine NEGATIVE NEGATIVE   Ketones, ur NEGATIVE NEGATIVE mg/dL   Protein, ur NEGATIVE NEGATIVE mg/dL   Nitrite NEGATIVE NEGATIVE   Leukocytes, UA LARGE (A) NEGATIVE   RBC / HPF 6-30 0 - 5 RBC/hpf   WBC, UA TOO NUMEROUS TO COUNT 0 - 5 WBC/hpf   Bacteria, UA MANY (A) NONE SEEN   Squamous Epithelial / LPF NONE SEEN NONE SEEN   WBC Clumps PRESENT    Mucus PRESENT     Comment: Performed at [facility], [address] [place], [place] [address]  Blood gas, arterial     Status: Abnormal (Preliminary result)   Collection Time: 02/25/18  1:18 AM  Result Value Ref Range   FIO2 60.00    Delivery systems VENTILATOR    Mode PRESSURE REGULATED VOLUME CONTROL    VT 400 mL   Peep/cpap 5.0 cm H20   pH, Arterial 7.42 7.350 - 7.450   pCO2 arterial 63 (H) 32.0 - 48.0 mmHg   pO2, Arterial 160 (H) 83.0 - 108.0 mmHg   Bicarbonate 40.9 (H) 20.0 - 28.0 mmol/L   Acid-Base Excess 14.4 (H) 0.0 - 2.0 mmol/L   O2 Saturation 99.5 %   Patient temperature 37.0    Collection site RIGHT RADIAL    Betty type ARTERIAL DRAW    Allens test (pass/fail) PASS PASS    Comment: Performed at [facility], [address] [place], [place] [address]   Mechanical Rate PENDING   Procalcitonin - Baseline     Status: None   Collection Time: 02/25/18  1:53 AM  Result Value Ref Range   Procalcitonin <0.10 ng/mL    Comment:        Interpretation: PCT (Procalcitonin) <= 0.5 ng/mL: Systemic infection (sepsis) is not likely. Local bacterial infection is possible. (NOTE)       Sepsis PCT Algorithm           Lower Respiratory Tract                                      Infection PCT Algorithm    ----------------------------     ----------------------------         PCT < 0.25 ng/mL                PCT < 0.10 ng/mL         Strongly encourage              Strongly discourage   discontinuation of antibiotics    initiation of antibiotics    ----------------------------     -----------------------------       PCT 0.25 - 0.50 ng/mL            PCT 0.10 - 0.25 ng/mL               OR       >80% decrease in PCT            Discourage initiation of  antibiotics      Encourage discontinuation           of antibiotics    ----------------------------     -----------------------------         PCT >= 0.50 ng/mL              PCT 0.26 - 0.50 ng/mL               AND        <80% decrease in PCT             Encourage initiation of                                             antibiotics       Encourage continuation           of antibiotics    ----------------------------     -----------------------------        PCT >= 0.50 ng/mL                  PCT > 0.50 ng/mL               AND         increase in PCT                  Strongly encourage                                      initiation of antibiotics    Strongly encourage escalation           of antibiotics                                     -----------------------------                                           PCT <= 0.25 ng/mL                                                 OR                                        > 80% decrease in PCT                                     Discontinue / Do not initiate                                             antibiotics Performed at [facility], [address], [place], [place] [address]   Magnesium     Status: None   Collection Time: 02/25/18  1:53 AM  Result Value Ref Range   Magnesium 2.4 1.7 - 2.4 mg/dL    Comment: Performed at [facility], [address] [place], [place] [address]  Phosphorus     Status: None   Collection Time: 02/25/18  1:53 AM  Result Value Ref Range   Phosphorus 3.8 2.5 - 4.6 mg/dL    Comment: Performed at [facility], [address]  [place], [place] [address]  Glucose, capillary     Status: Abnormal   Collection Time: 02/25/18  2:13 AM  Result Value Ref Range   Glucose-Capillary 163 (H) 65 - 99 mg/dL  Glucose, capillary     Status: Abnormal   Collection Time: 02/25/18  3:59 AM  Result Value Ref Range   Glucose-Capillary 147 (H) 65 - 99 mg/dL  Procalcitonin     Status: None   Collection Time: 02/25/18  4:52 AM  Result Value Ref Range   Procalcitonin <0.10 ng/mL    Comment:        Interpretation: PCT (Procalcitonin) <= 0.5 ng/mL: Systemic infection (sepsis) is not likely. Local bacterial infection is possible. (NOTE)       Sepsis PCT Algorithm           Lower Respiratory Tract                                      Infection PCT Algorithm    ----------------------------     ----------------------------         PCT < 0.25 ng/mL                PCT < 0.10 ng/mL         Strongly encourage             Strongly discourage   discontinuation of antibiotics    initiation of antibiotics    ----------------------------     -----------------------------       PCT 0.25 - 0.50 ng/mL            PCT 0.10 - 0.25 ng/mL               OR       >80% decrease in PCT            Discourage initiation of                                            antibiotics      Encourage discontinuation           of antibiotics    ----------------------------     -----------------------------         PCT >= 0.50 ng/mL              PCT 0.26 - 0.50 ng/mL               AND        <80% decrease in PCT             Encourage initiation of                                             antibiotics       Encourage continuation  of antibiotics    ----------------------------     -----------------------------        PCT >= 0.50 ng/mL                  PCT > 0.50 ng/mL               AND         increase in PCT                  Strongly encourage                                      initiation of antibiotics    Strongly encourage escalation           of  antibiotics                                     -----------------------------                                           PCT <= 0.25 ng/mL                                                 OR                                        > 80% decrease in PCT                                     Discontinue / Do not initiate                                             antibiotics Performed at [facility], [address] [place], [place] [address]   Magnesium     Status: Abnormal   Collection Time: 02/25/18  4:52 AM  Result Value Ref Range   Magnesium 2.6 (H) 1.7 - 2.4 mg/dL    Comment: Performed at [facility], [address] [place], [place] [address]  Phosphorus     Status: None   Collection Time: 02/25/18  4:52 AM  Result Value Ref Range   Phosphorus 4.0 2.5 - 4.6 mg/dL    Comment: Performed at [facility], [address] [place], [place] [address]  CBC with Differential/Platelet     Status: Abnormal   Collection Time: 02/25/18  4:52 AM  Result Value Ref Range   WBC 7.0 3.6 - 11.0 K/uL   RBC 3.44 (L) 3.80 - 5.20 MIL/uL   Hemoglobin 8.6 (L) 12.0 - 16.0 g/dL    Comment: RESULT REPEATED AND VERIFIED   HCT 29.3 (L) 35.0 - 47.0 %   MCV 85.1 80.0 - 100.0 fL   MCH 25.1 (L) 26.0 - 34.0 pg   MCHC 29.5 (  L) 32.0 - 36.0 g/dL   RDW 20.6 (H) 11.5 - 14.5 %   Platelets 275 150 - 440 K/uL   Neutrophils Relative % 97 %   Neutro Abs 6.8 (H) 1.4 - 6.5 K/uL   Lymphocytes Relative 1 %   Lymphs Abs 0.1 (L) 1.0 - 3.6 K/uL   Monocytes Relative 2 %   Monocytes Absolute 0.2 0.2 - 0.9 K/uL   Eosinophils Relative 0 %   Eosinophils Absolute 0.0 0 - 0.7 K/uL   Basophils Relative 0 %   Basophils Absolute 0.0 0 - 0.1 K/uL    Comment: Performed at [facility], [address] [place], [place] [address]  Basic metabolic panel     Status: Abnormal   Collection Time: 02/25/18  4:52 AM  Result Value Ref Range   Sodium 156 (H) 135 - 145 mmol/L   Potassium 4.3 3.5 - 5.1  mmol/L   Chloride 115 (H) 101 - 111 mmol/L   CO2 36 (H) 22 - 32 mmol/L   Glucose, Bld 175 (H) 65 - 99 mg/dL   BUN 27 (H) 6 - 20 mg/dL   Creatinine, Ser 0.88 0.44 - 1.00 mg/dL   Calcium 8.3 (L) 8.9 - 10.3 mg/dL   GFR calc non Af Amer >60 >60 mL/min   GFR calc Af Amer >60 >60 mL/min    Comment: (NOTE) The eGFR has been calculated using the CKD EPI equation. This calculation has not been validated in all clinical situations. eGFR's persistently <60 mL/min signify possible Chronic Kidney Disease.    Anion gap 5 5 - 15    Comment: Performed at [facility], [address] [place], [place] [address]  Protime-INR     Status: Abnormal   Collection Time: 02/25/18  4:52 AM  Result Value Ref Range   Prothrombin Time 18.7 (H) 11.4 - 15.2 seconds   INR 1.58     Comment: Performed at [facility], [address] [place], [place] [address]  Glucose, capillary     Status: Abnormal   Collection Time: 02/25/18  7:12 AM  Result Value Ref Range   Glucose-Capillary 149 (H) 65 - 99 mg/dL   Comment 1 Notify RN   . Dg Chest Port 1 View  Result Date: 02/25/2018 CLINICAL DATA:  Intubation. EXAM: PORTABLE CHEST 1 VIEW COMPARISON:  Radiographs 2 days prior 3148 FINDINGS: Endotracheal tube 4.5 cm from the carina at the thoracic inlet. 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 Unchanged cardiomegaly and mediastinal contours. Unchanged right pleural effusion. Worsening left pleural effusion and retrocardiac opacity. Slight improvement in pulmonary edema from prior exam. IMPRESSION: 1. Endotracheal tube 4.5 cm from the carina at the thoracic inlet. 2. Worsening left pleural effusion and retrocardiac opacity likely atelectasis. Unchanged right pleural effusion. 3. Slight improvement in pulmonary edema. Electronically Signed   By: Jeb Levering M.D.   On: 02/25/2018 03:54  .   ASSESSMENT: Patient with epistaxis after attempt at NG placement. This is adequately controlled with  current pack  PLAN: Would not recommend re-packing, as this would just incur further pain and trauma. Some oozing expected around pack, especially with her anticoagulation. Keep BP stable, try to avoid any hypertension which might increase bleeding. Recommend staph prophylaxis to reduce risk of toxic shock while packed. We generally recommend packs remain for 5 days in anticoagulated patients to allow healing of mucosal trauma and reduce chance of re-bleeding and need for re-packing once the pack is removed. It sounds like she  will be transferred, so this will need to be handled at accepting facility.    Betty Nearing, MD 02/25/2018 8:13 AM

## 2018-02-25 NOTE — Progress Notes (Signed)
Approximately [phone] patient was found to be bleeding from nasal cavity from prior NG attempts, drainage from left side greater than right side by assessment. Nasal pressure was held for greater than thirty minutes to nose, with gauze packing. Active bright red bleeding, Hinton Dyer NP at bedside. Rapid Rhino was placed in left nare.

## 2018-02-25 NOTE — Progress Notes (Signed)
02/24/18 2320 patient intubated for respiratory distress and increase work of breathing. Patient tolerated procedure. Left groin central line placed. Multiple attempts were made by staff RN's to gain enteral nutrition route, via NG/OG. No attempt successful. Unable to gain access at this time, due to bloody drainage from nose and mouth. Bleeding was minimal and ceased. Will notify [facility] NP. Will await chest xray to confirm placement of ET Tube.

## 2018-02-25 NOTE — Progress Notes (Signed)
[place] [facility] [place] NAME: Betty [name]    MR#:  [id number]  DATE OF BIRTH:  [birth year]  SUBJECTIVE:  Patient intubated due to hypercarbic respiratory failure Family at bedside. After NG tube patient had significant nosebleed. Patient was evaluated by ENT earlier this morning.   Unable to obtain ROS as patient is intubated  Tolerating Diet: Nothing by mouth   DRUG ALLERGIES:   Allergies  Allergen Reactions  . Flecainide Shortness Of Breath and Other (See Comments)    Reaction: dizziness   . Amiodarone Other (See Comments)    Pt states that this medication causes lung bleeding.      VITALS:  Blood pressure 125/77, pulse (!) 116, temperature 99.6 F (37.6 C), temperature source Oral, resp. rate (!) 22, height 5\' 9"  (1.753 m), weight 96.3 kg (212 lb 4.9 oz), SpO2 98 %.  PHYSICAL EXAMINATION:  Constitutional: Patient is intubated and sedated with cervical collar and ecchymosis on face HENT: Normocephalic. Intubated Eyes:  no scleral icterus.  Neck: Cervical collar placed  CVS: Tachycardia S1/S2 +, no murmurs, no gallops, no carotid bruit.  Pulmonary: decreased breath sounds right base  Abdominal: Soft. BS +,  no distension, tenderness, rebound or guarding.  Musculoskeletal: No edema and no tenderness.  Neuro: Sedated on ventilator  Skin: Facial ecchymosis  Psychiatric: Sedated on ventilator  LABORATORY PANEL:   CBC Recent Labs  Lab 02/25/18 0452  WBC 7.0  HGB 8.6*  HCT 29.3*  PLT 275   ------------------------------------------------------------------------------------------------------------------  Chemistries  Recent Labs  Lab 02/23/18 0053  02/25/18 0452  NA 149*  --  156*  K 4.3  --  4.3  CL 106  --  115*  CO2 30  --  36*  GLUCOSE 165*  --  175*  BUN 17  --  27*  CREATININE 0.64  --  0.88  CALCIUM 8.6*  --  8.3*  MG 2.3   < > 2.6*  AST 321*  --   --   ALT 255*  --   --   ALKPHOS 124  --   --   BILITOT  2.5*  --   --    < > = values in this interval not displayed.   ------------------------------------------------------------------------------------------------------------------  Cardiac Enzymes Recent Labs  Lab 02/19/18 0709  TROPONINI <0.03   ------------------------------------------------------------------------------------------------------------------  RADIOLOGY:  Dg Chest Port 1 View  Result Date: 02/25/2018 CLINICAL DATA:  Intubation. EXAM: PORTABLE CHEST 1 VIEW COMPARISON:  Radiographs 2 days prior 3148 FINDINGS: Endotracheal tube 4.5 cm from the carina at the thoracic inlet. 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 Unchanged cardiomegaly and mediastinal contours. Unchanged right pleural effusion. Worsening left pleural effusion and retrocardiac opacity. Slight improvement in pulmonary edema from prior exam. IMPRESSION: 1. Endotracheal tube 4.5 cm from the carina at the thoracic inlet. 2. Worsening left pleural effusion and retrocardiac opacity likely atelectasis. Unchanged right pleural effusion. 3. Slight improvement in pulmonary edema. Electronically Signed   By: Jeb Levering M.D.   On: 02/25/2018 03:54     ASSESSMENT AND PLAN:   72 year old female status post mechanical fall who suffered C1/C2 fracture and now in the ICU for acute encephalopathy with hypotension  1. Acute hypoxic/hypercapnic respiratory failure Patient is now intubated and sedated on ventilator. Management as per intensivist  2. Acute encephalopathy in the setting of problem #1 and pain medications   3. Hypotension, unclear etiology: She was been weaned off of pressors 4. C1/C2 fracture after mechanical  fall: Neurosurgery is recommending inpatient CTA to rule out vertebral artery injury Patient unstable this time Continue neck brace Patient will need this neck brace for at least  3 months She will follow up with neurosurgery in 2-4 weeks.  5. Acute kidney injury: Creatinine has  improved 6. Hyperkalemia: This has resolved 7. Leukocytosis of unclear etiology This has improved  UA and chest x-ray do not show evidence of pneumonia  8. Vasculitis Patient on Imuran  9. Diabetes: Sliding scale insulin  10. PAF: Heart rate is still elevated and she is Currently on diltiazem drip Cardiology consultation for further recommendations and management Continue Rythmol and when necessary metoprolol Cont Lovenox  11. Acute on chronic diastolic heart failure: Lasix 20 mg IV 1 given  12. Elevated LFTs: This could be due to hepatic congestion from CHF   13. Hypernatremia due to poor by mouth intake Patient would benefit from D5W   14. Severe epistaxis from NG insertion: ENT consultation appreciated. Keep blood pressure stable and try to avoid hypertension which might increase bleeding. Recommend Staphylococcus prophylaxis to reduce risk of toxic shock while packing is in place. Recommendations for packing. For at least 5 days.     Family requesting transfer to [place] bed no beds at [facility] this morning as per my conversation with nursing staff    CODE STATUS: FULL  TOTAL TIME TAKING CARE OF THIS PATIENT: 20 minutes.   [name].  POSSIBLE D/C ??, DEPENDING ON CLINICAL CONDITION.   [name] on 02/25/2018 at 12:12 PM  Between 7am to 6pm - Pager - [phone] After 6pm go to [email or web address] - password [email or web address]  Office  [phone]  CC: Primary care physician; Dion Body, MD  Note: This dictation was prepared with Dragon dictation along with smaller phrase technology. Any transcriptional errors that result from this process are unintentional.

## 2018-02-25 NOTE — Progress Notes (Signed)
Post ABG fio2 to 40 % tolerating well. RN aware

## 2018-02-25 NOTE — Procedures (Signed)
Central Venous Catheter Insertion Procedure Note Betty [name] [id number] [birth year]  Procedure: Insertion of Central Venous Catheter Indications: Assessment of intravascular volume, Drug and/or fluid administration and Frequent blood sampling  Procedure Details Consent: Risks of procedure as well as the alternatives and risks of each were explained to the (patient/caregiver).  Consent for procedure obtained. Time Out: Verified patient identification, verified procedure, site/side was marked, verified correct patient position, special equipment/implants available, medications/allergies/relevent history reviewed, required imaging and test results available.  Performed  Maximum sterile technique was used including antiseptics, cap, gloves, gown, hand hygiene, mask and sheet. Skin prep: Chlorhexidine; local anesthetic administered A antimicrobial bonded/coated triple lumen catheter was placed in the left femoral vein due to multiple attempts, no other available access using the Seldinger technique.  Evaluation Blood flow good Complications: No apparent complications Patient did tolerate procedure well. Chest X-ray ordered to verify placement.  CXR: not indicated .  Attempted to place right femoral CVL unable to thread guidewire, therefore placed left femoral CVL utilizing ultrasound   Marda Stalker, [place] Pager [phone] (please enter 7 digits) PCCM Consult Pager [phone] (please enter 7 digits)

## 2018-02-25 NOTE — Progress Notes (Addendum)
Initial Nutrition Assessment  DOCUMENTATION CODES:   Obesity unspecified  INTERVENTION:  Recommend attempting placement of OGT on 3/7. If placement is unsuccessful can consider ordering placement of tube under fluoroscopy.  Once enteral access placed and confirmed recommend initiating Vital High Protein at 40 mL/hr (960 mL goal daily volume) + Pro-Stat 60 mL BID. Provides 1360 kcal, 144 grams of protein, 806 mL H2O daily.  Recommend liquid MVI daily per tube once tube feeds are started.  If enteral access still cannot be obtained by 3/8, will need to consider initiation of TPN at that time as that will be day 7 of NPO status.  NUTRITION DIAGNOSIS:   Inadequate oral intake related to inability to eat as evidenced by NPO status.  GOAL:   Provide needs based on ASPEN/SCCM guidelines  MONITOR:   Vent status, Labs, Weight trends, TF tolerance, Skin, I & O's  REASON FOR ASSESSMENT:   Ventilator    ASSESSMENT:   72 year old female with PMHx of OA, goiter, esophageal reflux, A-fib, vasculitis, cardiomegaly, diastolic heart failure, HTN, asthma, DM type 2, COPD who was admitted on 2/28 from [facility] after unwitnessed fall with head injury, found to have C 1-2 fractures in a brace, and was transferred to ICU on 3/1 for worsening hypoxemic respiratory failure and encephalopathy initially requiring BiPAP, but patient was emergently intubated evening of 3/5 after failing BiPAP.   -She has been NPO since 3/1. -Per chart on 3/2 patient began choking when she was attempting to drink water. -Patient failed SLP bedside evaluation on 3/4. Plan was for MBSS on 3/5, but patient was on BiPAP. -Patient and family requested transfer to [facility], but still waiting on bed. -Per chart multiple unsuccessful attempts at placing OGT or NGT last night. Patient developed epistaxis after attempt at NGT placement last night. Currently has packing in place.  Patient is intubated and sedated. RD able to  complete very limited NFPE while RN was in room. Did not want to disturb fractures. She is actually known to this RD from an admission almost one year ago now. At that time she did not meet criteria for malnutrition. Unsure how her intake has been over the past year. Her weight does appear to have decreased over the past year, but patient had some difficulty with fluid retention so unsure how much of that was true weight loss. She was 227.3 lbs on 03/29/2017 and has lost approximately 15 lbs (6.6% body weight) over the past 11 months, which is not significant for time frame.  No enteral access at this time.  MAP: 70-99 mmHg; not requiring any pressors  Patient is currently intubated on ventilator support MV: 9.8 L/min Temp (24hrs), Avg:99.7 F (37.6 C), Min:98.2 F (36.8 C), Max:101.3 F (38.5 C)  Propofol: N/A  Medications reviewed and include: Oscal with D 1 tablet BID per tube, Colace, Novolog 0-15 units Q4hrs, Solu-Medrol 40 mg Q12hrs IV, pantoprazole, Senokot, Unasyn, diltiazem gtt, fentanyl gtt.  Labs reviewed: CBG 146-163, Sodium 156, Chloride 115, CO2 36, BUN 27. Potassium 4.3, Phosphorus 4, Magnesium 2.6.  I/O: 1575 mL UOP yesterday (0.7 mL/kg/hr)  Patient does not meet criteria for malnutrition at this time.  Discussed with RN and on rounds.  NUTRITION - FOCUSED PHYSICAL EXAM:    Most Recent Value  Orbital Region  Unable to assess  Upper Arm Region  No depletion  Thoracic and Lumbar Region  No depletion  Buccal Region  Unable to assess  [place] Region  Unable to assess  Clavicle Bone Region  No depletion  Clavicle and Acromion Bone Region  Mild depletion  Scapular Bone Region  Unable to assess  Dorsal Hand  Unable to assess  Patellar Region  Mild depletion  Anterior Thigh Region  Mild depletion  Posterior Calf Region  Mild depletion  Edema (RD Assessment)  Unable to assess  Hair  Reviewed  Eyes  Unable to assess  Mouth  Unable to assess  Skin  Reviewed [ecchymosis]   Nails  Reviewed     Diet Order:  Aspiration precautions  EDUCATION NEEDS:   No education needs have been identified at this time  Skin:  Skin Assessment: Skin Integrity Issues:(MSAD to groin; skin tear left knee; scattered ecchymosis; abrasion to head)  Last BM:  02/19/2018; NO DOCUMENTED BM THIS ADMISSION  Height:   Ht Readings from Last 1 Encounters:  02/24/18 5\' 9"  (1.753 m)    Weight:   Wt Readings from Last 1 Encounters:  02/25/18 212 lb 4.9 oz (96.3 kg)    Ideal Body Weight:  65.9 kg  BMI:  Body mass index is 31.35 kg/m.  Estimated Nutritional Needs:   Kcal:  1610-9604 (11-14 kcal/kg)  Protein:  >/= 132 grams (>/= 2 grams/kg IBW)  Fluid:  1.6-2 L/day (25-30 mL/kg IBW)  Willey Blade, MS, RD, LDN Office: [phone] Pager: [phone] After Hours/Weekend Pager: [phone]

## 2018-02-25 NOTE — Progress Notes (Signed)
Betty [name], Betty [name] [id number] [birth year] [name], MD   SUBJECTIVE: This 72 y.o. year old female is status post nasal packing for persistent epistaxis. Has been controlled since packing with no need for further Afrin.   Medications:  Current Facility-Administered Medications  Medication Dose Route Frequency Provider Last Rate Last Dose  . acetaminophen (TYLENOL) tablet 650 mg  650 mg Oral Q6H PRN Bettey Costa, MD       Or  . acetaminophen (TYLENOL) suppository 650 mg  650 mg Rectal Q6H PRN Bettey Costa, MD   650 mg at 02/24/18 1918  . ampicillin-sulbactam (UNASYN) 1.5 g in sodium chloride 0.9 % 100 mL IVPB  1.5 g Intravenous Q6H Flora Lipps, MD   Stopped at 02/25/18 1450  . azaTHIOprine (IMURAN) tablet 100 mg  100 mg Oral Daily Bettey Costa, MD   100 mg at 02/19/18 1144  . budesonide (PULMICORT) nebulizer solution 0.25 mg  0.25 mg Nebulization BID Awilda Bill, NP   0.25 mg at 02/25/18 0735  . calcium-vitamin D (OSCAL WITH D) 500-200 MG-UNIT per tablet 1 tablet  1 tablet Per Tube Daily Awilda Bill, NP      . chlorhexidine gluconate (MEDLINE KIT) (PERIDEX) 0.12 % solution 15 mL  15 mL Mouth Rinse BID Awilda Bill, NP      . diltiazem (CARDIZEM) 100 mg in dextrose 5 % 100 mL (1 mg/mL) infusion  5-15 mg/hr Intravenous Titrated Kipp Brood, MD 15 mL/hr at 02/25/18 1538 15 mg/hr at 02/25/18 1538  . docusate (COLACE) 50 MG/5ML liquid 100 mg  100 mg Per Tube Daily Awilda Bill, NP      . fentaNYL (SUBLIMAZE) bolus via infusion 25 mcg  25 mcg Intravenous Q1H PRN Awilda Bill, NP      . fentaNYL (SUBLIMAZE) injection 50 mcg  50 mcg Intravenous Once Awilda Bill, NP      . fentaNYL 2523mg in NS 2549m(1070mml) infusion-PREMIX  25-400 mcg/hr Intravenous Continuous BlaAwilda BillP 3 mL/hr at 02/25/18 1414 30 mcg/hr at 02/25/18 1414  . insulin aspart (novoLOG) injection 0-15 Units  0-15 Units Subcutaneous Q4H TukDorene Sorrow NP   3 Units at 02/25/18 1612  .  ipratropium-albuterol (DUONEB) 0.5-2.5 (3) MG/3ML nebulizer solution 3 mL  3 mL Nebulization Q4H KasFlora LippsD   3 mL at 02/25/18 1513  . levalbuterol (XOPENEX) nebulizer solution 0.63 mg  0.63 mg Nebulization Q6H PRN BlaAwilda BillP      . MEDLINE mouth rinse  15 mL Mouth Rinse 10 times per day BlaAwilda BillP   15 mL at 02/25/18 0500  . methylPREDNISolone sodium succinate (SOLU-MEDROL) 40 mg/mL injection 40 mg  40 mg Intravenous Q12H BlaAwilda BillP   40 mg at 02/25/18 1238  . metoprolol tartrate (LOPRESSOR) injection 5 mg  5 mg Intravenous Q2H PRN Callwood, Dwayne D, MD   5 mg at 02/25/18 0938  . midazolam (VERSED) injection 1 mg  1 mg Intravenous Q15 min PRN BlaAwilda BillP   1 mg at 02/24/18 2354  . midazolam (VERSED) injection 1 mg  1 mg Intravenous Q2H PRN BlaAwilda BillP   1 mg at 02/24/18 2335  . ondansetron (ZOFRAN) injection 4 mg  4 mg Intravenous Q6H PRN Mody, Sital, MD      . opium-belladonna (B&O SUPPRETTES) 16.2-60 MG suppository 1 suppository  1 suppository Rectal Q6H PRN ModBettey CostaD      . oxybutynin (DITROPAN) tablet  5 mg  5 mg Per Tube TID Awilda Bill, NP      . pantoprazole (PROTONIX) injection 40 mg  40 mg Intravenous Q24H Flora Lipps, MD   40 mg at 02/25/18 1238  . polyethylene glycol (MIRALAX / GLYCOLAX) packet 17 g  17 g Oral Daily PRN Mody, Sital, MD      . propafenone (RYTHMOL) tablet 225 mg  225 mg Per Tube Q12H Awilda Bill, NP      . rocuronium (ZEMURON) injection 50 mg  50 mg Intravenous Once Awilda Bill, NP      . sennosides (SENOKOT) 8.8 MG/5ML syrup 5 mL  5 mL Per Tube BID Awilda Bill, NP      .  Medications Prior to Admission  Medication Sig Dispense Refill  . acetaminophen (TYLENOL) 325 MG tablet Take 2 tablets (650 mg total) by mouth every 6 (six) hours as needed for mild pain (or Fever >/= 101). 30 tablet 0  . albuterol (PROVENTIL HFA;VENTOLIN HFA) 108 (90 Base) MCG/ACT inhaler Inhale 2 puffs into the lungs  every 4 (four) hours as needed for wheezing or shortness of breath. 1 Inhaler 10  . albuterol (PROVENTIL) (2.5 MG/3ML) 0.083% nebulizer solution Take 3 mLs (2.5 mg total) by nebulization every 4 (four) hours as needed for wheezing or shortness of breath. 75 mL 12  . atorvastatin (LIPITOR) 20 MG tablet Take 20 mg by mouth daily.    Marland Kitchen azaTHIOprine (IMURAN) 50 MG tablet Take 100 mg by mouth daily.     . calcium-vitamin D (OSCAL WITH D) 500-200 MG-UNIT per tablet Take 1 tablet by mouth daily.    Marland Kitchen diltiazem (CARDIZEM CD) 180 MG 24 hr capsule Take 180 mg by mouth daily.  3  . diphenhydrAMINE (BENADRYL) 25 mg capsule Take 25 mg by mouth every 6 (six) hours as needed.    . ferrous sulfate 325 (65 FE) MG EC tablet Take 325 mg by mouth daily.   11  . fluticasone (FLONASE) 50 MCG/ACT nasal spray Place 2 sprays into the nose daily.     Marland Kitchen gabapentin (NEURONTIN) 600 MG tablet Take 600 mg by mouth 2 (two) times daily.    Marland Kitchen guaiFENesin-dextromethorphan (ROBITUSSIN DM) 100-10 MG/5ML syrup Take 5 mLs by mouth every 4 (four) hours as needed for cough. 118 mL 0  . Lancets 30G MISC Use 1 Units as directed. Check CBG's fasting once daily. Dx: E11.9    . loratadine (CLARITIN) 10 MG tablet Take 10 mg by mouth daily.  11  . magic mouthwash SOLN Take 10 mLs by mouth every 6 (six) hours as needed for mouth pain.    . magnesium oxide (MAG-OX) 400 (241.3 Mg) MG tablet TAKE 1 TABLET (400 MG TOTAL) BY MOUTH ONCE DAILY.  3  . Melatonin 5 MG TABS Take 5 mg by mouth at bedtime as needed.    . metFORMIN (GLUCOPHAGE) 1000 MG tablet Take 1,000 mg by mouth 2 (two) times daily with a meal.     . metolazone (ZAROXOLYN) 2.5 MG tablet Take 2.5 mg by mouth 2 (two) times a week. Take 2.5 mg by mouth on Monday and Thursday.    . metoprolol succinate (TOPROL-XL) 25 MG 24 hr tablet Take 25 mg by mouth 2 (two) times daily.   3  . opium-belladonna (B&O SUPPRETTES) 16.2-60 MG suppository Place 1 suppository rectally every 6 (six) hours as needed  for bladder spasms. 30 suppository 0  . oxybutynin (DITROPAN) 5 MG tablet Take 5  mg by mouth 3 (three) times daily.     Marland Kitchen oxyCODONE (OXY IR/ROXICODONE) 5 MG immediate release tablet Take 5 mg by mouth every 4 (four) hours as needed.    . pantoprazole (PROTONIX) 40 MG tablet Take 40 mg by mouth daily.    . potassium chloride SA (K-DUR,KLOR-CON) 20 MEQ tablet Take 1 tablet (20 mEq total) by mouth 2 (two) times daily. (Patient taking differently: Take 40 mEq by mouth 2 (two) times daily. ) 60 tablet 0  . propafenone (RYTHMOL) 225 MG tablet Take 225 mg by mouth every 12 (twelve) hours.    Marland Kitchen rOPINIRole (REQUIP) 4 MG tablet Take 4 mg by mouth at bedtime.  3  . senna-docusate (SENNA-PLUS) 8.6-50 MG tablet Take 2 tablets by mouth 2 (two) times daily.     Marland Kitchen spironolactone (ALDACTONE) 25 MG tablet Take 25 mg by mouth daily.    Marland Kitchen torsemide (DEMADEX) 20 MG tablet Take 80 mg by mouth 2 (two) times daily.     Marland Kitchen venlafaxine (EFFEXOR) 75 MG tablet Take 75 mg by mouth 3 (three) times daily.  3  . Vitamin D, Ergocalciferol, (DRISDOL) 50000 units CAPS capsule Take 50,000 Units by mouth every 7 (seven) days. Mondays only    . enoxaparin (LOVENOX) 100 MG/ML injection Inject 0.95 mLs (95 mg total) into the skin every 12 (twelve) hours. X 3-4 days until INR reaches goal of 2.5 (Patient not taking: Reported on 02/19/2018) 5 mL 0  . traZODone (DESYREL) 150 MG tablet Take 1 tablet (150 mg total) by mouth at bedtime as needed for sleep. (Patient not taking: Reported on 02/19/2018) 30 tablet 0  . warfarin (COUMADIN) 6 MG tablet Take 6 mg by mouth daily at 6 PM.       OBJECTIVE:  PHYSICAL EXAM  Vitals: Blood pressure 114/62, pulse (!) 127, temperature 100.3 F (37.9 C), temperature source Axillary, resp. rate (!) 23, height _0  (1.753 m), weight 212 lb 4.9 oz (96.3 kg), SpO2 98 %..  Nose: Pack in place, saturated, but no bleeding around pack. No blood from mouth  MEDICAL DECISION MAKING: Data Review:  Results for  orders placed or performed during the hospital encounter of 02/19/18 (from the past 48 hour(s))  Glucose, capillary     Status: Abnormal   Collection Time: 02/23/18  7:43 PM  Result Value Ref Range   Glucose-Capillary 134 (H) 65 - 99 mg/dL  Glucose, capillary     Status: Abnormal   Collection Time: 02/23/18 11:54 PM  Result Value Ref Range   Glucose-Capillary 135 (H) 65 - 99 mg/dL  Glucose, capillary     Status: Abnormal   Collection Time: 02/24/18  3:53 AM  Result Value Ref Range   Glucose-Capillary 141 (H) 65 - 99 mg/dL  Protime-INR     Status: Abnormal   Collection Time: 02/24/18  4:51 AM  Result Value Ref Range   Prothrombin Time 18.5 (H) 11.4 - 15.2 seconds   INR 1.56     Comment: Performed at [facility], [address] [place], [place] [address]  Glucose, capillary     Status: Abnormal   Collection Time: 02/24/18  7:31 AM  Result Value Ref Range   Glucose-Capillary 134 (H) 65 - 99 mg/dL  Glucose, capillary     Status: Abnormal   Collection Time: 02/24/18 11:59 AM  Result Value Ref Range   Glucose-Capillary 129 (H) 65 - 99 mg/dL  Glucose, capillary     Status: Abnormal   Collection Time:  02/24/18  4:03 PM  Result Value Ref Range   Glucose-Capillary 131 (H) 65 - 99 mg/dL  Glucose, capillary     Status: Abnormal   Collection Time: 02/24/18  7:45 PM  Result Value Ref Range   Glucose-Capillary 114 (H) 65 - 99 mg/dL  Blood gas, arterial     Status: Abnormal (Preliminary result)   Collection Time: 02/24/18  8:14 PM  Result Value Ref Range   FIO2 40.00    Delivery systems BILEVEL POSITIVE AIRWAY PRESSURE    Inspiratory PAP 12.0    Expiratory PAP 6.0    pH, Arterial 7.39 7.350 - 7.450   pCO2 arterial 73 (HH) 32.0 - 48.0 mmHg    Comment: CRITICAL RESULT CALLED TO, READ BACK BY AND VERIFIED WITH: Clyde Canterbury NP AT 2033 [phone] BY SMATHEWRRT    pO2, Arterial PENDING 83.0 - 108.0 mmHg   Bicarbonate 44.2 (H) 20.0 - 28.0 mmol/L   Acid-Base Excess 16.1 (H) 0.0 -  2.0 mmol/L   O2 Saturation PENDING %   Patient temperature 37.0    Oxygen index PENDING    Collection site RIGHT RADIAL    Sample type ARTERIAL DRAW    Allens test (pass/fail) PASS PASS    Comment: Performed at [facility], [address] [place], [place] [address]  Urinalysis, Complete w Microscopic     Status: Abnormal   Collection Time: 02/24/18 10:45 PM  Result Value Ref Range   Color, Urine YELLOW (A) YELLOW   APPearance CLOUDY (A) CLEAR   Specific Gravity, Urine 1.008 1.005 - 1.030   pH 5.0 5.0 - 8.0   Glucose, UA NEGATIVE NEGATIVE mg/dL   Hgb urine dipstick SMALL (A) NEGATIVE   Bilirubin Urine NEGATIVE NEGATIVE   Ketones, ur NEGATIVE NEGATIVE mg/dL   Protein, ur NEGATIVE NEGATIVE mg/dL   Nitrite NEGATIVE NEGATIVE   Leukocytes, UA LARGE (A) NEGATIVE   RBC / HPF 6-30 0 - 5 RBC/hpf   WBC, UA TOO NUMEROUS TO COUNT 0 - 5 WBC/hpf   Bacteria, UA MANY (A) NONE SEEN   Squamous Epithelial / LPF NONE SEEN NONE SEEN   WBC Clumps PRESENT    Mucus PRESENT     Comment: Performed at [facility], [address] [place], [place] [address]  Blood gas, arterial     Status: Abnormal (Preliminary result)   Collection Time: 02/25/18  1:18 AM  Result Value Ref Range   FIO2 60.00    Delivery systems VENTILATOR    Mode PRESSURE REGULATED VOLUME CONTROL    VT 400 mL   Peep/cpap 5.0 cm H20   pH, Arterial 7.42 7.350 - 7.450   pCO2 arterial 63 (H) 32.0 - 48.0 mmHg   pO2, Arterial 160 (H) 83.0 - 108.0 mmHg   Bicarbonate 40.9 (H) 20.0 - 28.0 mmol/L   Acid-Base Excess 14.4 (H) 0.0 - 2.0 mmol/L   O2 Saturation 99.5 %   Patient temperature 37.0    Collection site RIGHT RADIAL    Sample type ARTERIAL DRAW    Allens test (pass/fail) PASS PASS    Comment: Performed at [facility], [address] [place], [place] [address]   Mechanical Rate PENDING   Procalcitonin - Baseline     Status: None   Collection Time: 02/25/18  1:53 AM  Result Value Ref Range    Procalcitonin <0.10 ng/mL    Comment:        Interpretation: PCT (Procalcitonin) <= 0.5 ng/mL: Systemic infection (sepsis) is not likely.  Local bacterial infection is possible. (NOTE)       Sepsis PCT Algorithm           Lower Respiratory Tract                                      Infection PCT Algorithm    ----------------------------     ----------------------------         PCT < 0.25 ng/mL                PCT < 0.10 ng/mL         Strongly encourage             Strongly discourage   discontinuation of antibiotics    initiation of antibiotics    ----------------------------     -----------------------------       PCT 0.25 - 0.50 ng/mL            PCT 0.10 - 0.25 ng/mL               OR       >80% decrease in PCT            Discourage initiation of                                            antibiotics      Encourage discontinuation           of antibiotics    ----------------------------     -----------------------------         PCT >= 0.50 ng/mL              PCT 0.26 - 0.50 ng/mL               AND        <80% decrease in PCT             Encourage initiation of                                             antibiotics       Encourage continuation           of antibiotics    ----------------------------     -----------------------------        PCT >= 0.50 ng/mL                  PCT > 0.50 ng/mL               AND         increase in PCT                  Strongly encourage                                      initiation of antibiotics    Strongly encourage escalation           of antibiotics                                     -----------------------------  PCT <= 0.25 ng/mL                                                 OR                                        > 80% decrease in PCT                                     Discontinue / Do not initiate                                             antibiotics Performed at [facility],  [address] [place], [place] [address]   Magnesium     Status: None   Collection Time: 02/25/18  1:53 AM  Result Value Ref Range   Magnesium 2.4 1.7 - 2.4 mg/dL    Comment: Performed at [facility], [address] [place], [place] [address]  Phosphorus     Status: None   Collection Time: 02/25/18  1:53 AM  Result Value Ref Range   Phosphorus 3.8 2.5 - 4.6 mg/dL    Comment: Performed at [facility], [address] [place], [place] [address]  Glucose, capillary     Status: Abnormal   Collection Time: 02/25/18  2:13 AM  Result Value Ref Range   Glucose-Capillary 163 (H) 65 - 99 mg/dL  Glucose, capillary     Status: Abnormal   Collection Time: 02/25/18  3:59 AM  Result Value Ref Range   Glucose-Capillary 147 (H) 65 - 99 mg/dL  Procalcitonin     Status: None   Collection Time: 02/25/18  4:52 AM  Result Value Ref Range   Procalcitonin <0.10 ng/mL    Comment:        Interpretation: PCT (Procalcitonin) <= 0.5 ng/mL: Systemic infection (sepsis) is not likely. Local bacterial infection is possible. (NOTE)       Sepsis PCT Algorithm           Lower Respiratory Tract                                      Infection PCT Algorithm    ----------------------------     ----------------------------         PCT < 0.25 ng/mL                PCT < 0.10 ng/mL         Strongly encourage             Strongly discourage   discontinuation of antibiotics    initiation of antibiotics    ----------------------------     -----------------------------       PCT 0.25 - 0.50 ng/mL            PCT 0.10 - 0.25 ng/mL               OR       >80% decrease  in PCT            Discourage initiation of                                            antibiotics      Encourage discontinuation           of antibiotics    ----------------------------     -----------------------------         PCT >= 0.50 ng/mL              PCT 0.26 - 0.50 ng/mL               AND        <80% decrease in PCT              Encourage initiation of                                             antibiotics       Encourage continuation           of antibiotics    ----------------------------     -----------------------------        PCT >= 0.50 ng/mL                  PCT > 0.50 ng/mL               AND         increase in PCT                  Strongly encourage                                      initiation of antibiotics    Strongly encourage escalation           of antibiotics                                     -----------------------------                                           PCT <= 0.25 ng/mL                                                 OR                                        > 80% decrease in PCT                                     Discontinue / Do not initiate  antibiotics Performed at [facility], [address] [place], [place] [address]   Magnesium     Status: Abnormal   Collection Time: 02/25/18  4:52 AM  Result Value Ref Range   Magnesium 2.6 (H) 1.7 - 2.4 mg/dL    Comment: Performed at [facility], [address] [place], [place] [address]  Phosphorus     Status: None   Collection Time: 02/25/18  4:52 AM  Result Value Ref Range   Phosphorus 4.0 2.5 - 4.6 mg/dL    Comment: Performed at [facility], [address] [place], [place] [address]  CBC with Differential/Platelet     Status: Abnormal   Collection Time: 02/25/18  4:52 AM  Result Value Ref Range   WBC 7.0 3.6 - 11.0 K/uL   RBC 3.44 (L) 3.80 - 5.20 MIL/uL   Hemoglobin 8.6 (L) 12.0 - 16.0 g/dL    Comment: RESULT REPEATED AND VERIFIED   HCT 29.3 (L) 35.0 - 47.0 %   MCV 85.1 80.0 - 100.0 fL   MCH 25.1 (L) 26.0 - 34.0 pg   MCHC 29.5 (L) 32.0 - 36.0 g/dL   RDW 20.6 (H) 11.5 - 14.5 %   Platelets 275 150 - 440 K/uL   Neutrophils Relative % 97 %   Neutro Abs 6.8 (H) 1.4 - 6.5 K/uL   Lymphocytes Relative 1 %   Lymphs Abs 0.1 (L) 1.0 - 3.6 K/uL    Monocytes Relative 2 %   Monocytes Absolute 0.2 0.2 - 0.9 K/uL   Eosinophils Relative 0 %   Eosinophils Absolute 0.0 0 - 0.7 K/uL   Basophils Relative 0 %   Basophils Absolute 0.0 0 - 0.1 K/uL    Comment: Performed at [facility], [address] [place], [place] [address]  Basic metabolic panel     Status: Abnormal   Collection Time: 02/25/18  4:52 AM  Result Value Ref Range   Sodium 156 (H) 135 - 145 mmol/L   Potassium 4.3 3.5 - 5.1 mmol/L   Chloride 115 (H) 101 - 111 mmol/L   CO2 36 (H) 22 - 32 mmol/L   Glucose, Bld 175 (H) 65 - 99 mg/dL   BUN 27 (H) 6 - 20 mg/dL   Creatinine, Ser 0.88 0.44 - 1.00 mg/dL   Calcium 8.3 (L) 8.9 - 10.3 mg/dL   GFR calc non Af Amer >60 >60 mL/min   GFR calc Af Amer >60 >60 mL/min    Comment: (NOTE) The eGFR has been calculated using the CKD EPI equation. This calculation has not been validated in all clinical situations. eGFR's persistently <60 mL/min signify possible Chronic Kidney Disease.    Anion gap 5 5 - 15    Comment: Performed at [facility], [address] [place], [place] [address]  Protime-INR     Status: Abnormal   Collection Time: 02/25/18  4:52 AM  Result Value Ref Range   Prothrombin Time 18.7 (H) 11.4 - 15.2 seconds   INR 1.58     Comment: Performed at [facility], [address] [place], [place] [address]  Culture, respiratory (NON-Expectorated)     Status: None (Preliminary result)   Collection Time: 02/25/18  5:37 AM  Result Value Ref Range   Specimen Description      TRACHEAL ASPIRATE Performed at [facility], [address], [place], [place] [address]    Special Requests      NONE Performed at [facility], [address] [place], [place] [address]    Gram Stain      NO WBC SEEN NO ORGANISMS SEEN Performed at [facility], [address], [place], [place] [address]    Culture PENDING    Report Status PENDING   Glucose, capillary     Status:  Abnormal   Collection Time: 02/25/18  7:12 AM  Result Value Ref Range   Glucose-Capillary 149 (H) 65 - 99 mg/dL   Comment 1 Notify RN   Glucose, capillary     Status: Abnormal   Collection Time: 02/25/18 11:59 AM  Result Value Ref Range   Glucose-Capillary 146 (H) 65 - 99 mg/dL  Hemoglobin and hematocrit, blood     Status: Abnormal   Collection Time: 02/25/18 12:04 PM  Result Value Ref Range   Hemoglobin 8.1 (L) 12.0 - 16.0 g/dL   HCT 27.2 (L) 35.0 - 47.0 %    Comment: Performed at [facility], [address] [place], [place] [address]  Glucose, capillary     Status: Abnormal   Collection Time: 02/25/18  4:04 PM  Result Value Ref Range   Glucose-Capillary 153 (H) 65 - 99 mg/dL  . Dg Chest Port 1 View  Result Date: 02/25/2018 CLINICAL DATA:  Intubation. EXAM: PORTABLE CHEST 1 VIEW COMPARISON:  Radiographs 2 days prior 3148 FINDINGS: Endotracheal tube 4.5 cm from the carina at the thoracic inlet. 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 Unchanged cardiomegaly and mediastinal contours. Unchanged right pleural effusion. Worsening left pleural effusion and retrocardiac opacity. Slight improvement in pulmonary edema from prior exam. IMPRESSION: 1. Endotracheal tube 4.5 cm from the carina at the thoracic inlet. 2. Worsening left pleural effusion and retrocardiac opacity likely atelectasis. Unchanged right pleural effusion. 3. Slight improvement in pulmonary edema. Electronically Signed   By: Jeb Levering M.D.   On: 02/25/2018 03:54  .   ASSESSMENT: Left sided epistaxis from NG trauma, likely more difficult to control due to anticoagulation. Controlled now with 2 Merocel packs in place.  PLAN: Continue Unasyn to prophylax against infection. Leave pack until next Monday, then may try careful removal. Can spray Afrin into pack if there is any breakthrough bleeding. Will leave decision to resume anticoagulant therapy to CCU team, but short acting agents would be best  choice in case bleeding recurs. At least 24-48 hours off might help reduce the chance of re-bleeding, though will take a few days for mucosal trauma to heal.    [name], MD 02/25/2018 4:50 PM

## 2018-02-25 NOTE — Progress Notes (Signed)
02/25/2018  1:35 PM    Betty [name]  [id number]   Interim note:  Called back to CCU as patient began having more oozing around pack and fresh blood in throat. BP not elevated. Remains on antigoagulation. Removed the Rhino-rocket pack and placed 2 10 cm Merocel packs as detailed below. Some oozing persisted, even after 2 packs place, finally slowing after application of Afrin. Dr. Mortimer Fries plans to reverse anticoagulation given the continued bleeding. Will follow.   Pre-Op Diagnosis:  Left sided epistaxis  Post-op Diagnosis: same  Procedure:   Complex left nasal packing for control of epistaxis  Surgeon:  [name]  Anesthesia:  Topical, IV sedation  EBL:  01ID  Complications:  None  Findings: Persistent moderate bleeding from unidentified source in posterior nasal cavity, possibly the turbinates. Packed with two 10 cm Merocel packs.   Procedure: The previously placed Rhinorocket pack was deflated and removed. Moderate bleeding immediately noted. Nose suctioned, but unable to see actual source of bleeding. Appears more posterior. A 10 cm Merocel pack was placed after decongesting with Afrin and application of topical 4% lidocaine. Oozing persisted around this pack, which was then pushed more superiorly to allow placement of a second 10 cm Merocel pack. Initially oozing persisted, but after spraying more Afrin into the pack, this finally subsided. The patient was sedated from her intubation during the procedure, and tolerated it well.  Plan: Will monitor and hopefully this will control the bleeding. I do suspect the anticoagulation has contributed to the difficulty controlling this. Will leave pack in for 5 days. Needs antibiotic prophylaxis with staph coverage.   [name] 02/25/2018 1:35 PM

## 2018-02-25 NOTE — Progress Notes (Signed)
CC follow up mental status changes  SUBJECTIVE Patient failed biPAP last night Patient emergently intubated,sedated +nose bleed from NG insertion Now critically ill Prognosis is guarded High risk for cardiac arrest   Vitals:   02/25/18 0530 02/25/18 0600 02/25/18 0630 02/25/18 0700  BP: 121/75 116/70 118/71 112/73  Pulse: (!) 135 (!) 105 (!) 128 (!) 111  Resp: (!) 24 (!) 24 (!) 21 (!) 21  Temp:      TempSrc:      SpO2: 97% 97% 97% 97%  Weight:      Height:        ROS unobtainable due to critical illness   HEENT: intubated, on vent, facial eccymosis Left norstil balloon in place Neck: Cervical collar in place Chest: Bilateral crackles, no wheezes Cardiac: Regular, mechanical first heart sound, no M Abdomen: Soft, bowel sounds present, nontender Extremities: Warm, no edema Neuro: Cranial nerves intact, no focal deficits, diffusely weak   BMP Latest Ref Rng & Units 02/25/2018 02/23/2018 02/22/2018  Glucose 65 - 99 mg/dL 175(H) 165(H) 127(H)  BUN 6 - 20 mg/dL 27(H) 17 27(H)  Creatinine 0.44 - 1.00 mg/dL 0.88 0.64 0.68  Sodium 135 - 145 mmol/L 156(H) 149(H) 143  Potassium 3.5 - 5.1 mmol/L 4.3 4.3 4.0  Chloride 101 - 111 mmol/L 115(H) 106 103  CO2 22 - 32 mmol/L 36(H) 30 30  Calcium 8.9 - 10.3 mg/dL 8.3(L) 8.6(L) 8.5(L)    CBC Latest Ref Rng & Units 02/25/2018 02/23/2018 02/22/2018  WBC 3.6 - 11.0 K/uL 7.0 10.7 11.4(H)  Hemoglobin 12.0 - 16.0 g/dL 8.6(L) 9.2(L) 8.8(L)  Hematocrit 35.0 - 47.0 % 29.3(L) 29.0(L) 29.0(L)  Platelets 150 - 440 K/uL 275 269 18    CXR: NSC diffuse bilateral symmetric interstitial prominence  72 yo obese white female with end stage COPD with acute neck fracture nons-urgical now with severe and progressive resp failure from pulm edema and afib with RVR with h/o Mech valve with acute and severe COPD exacerbation   1.Respiratory Failure -continue Full MV support -continue Bronchodilator Therapy -Wean Fio2 and PEEP as tolerated  2.neck fracture Acute  nondisplaced cervical spine fracture after fall Follow up neuro surgery recs  3.Chronic interstitial lung disease IV steroids  4.History of mechanical mitral valve replacement H/o afib on cardizem infusion Continue AC Follow up cardiology recs  5.History of ANCA positive vasculitis  6.- intubated and sedated - minimal sedation to achieve a RASS goal: -1  7.severe epistaxis from NG insertion await ENt recs   Critical Care Time devoted to patient care services described in this note is 45  minutes.   Overall, patient is critically ill, prognosis is guarded.  Patient with Multiorgan failure and at high risk for cardiac arrest and death.    Corrin Parker, M.D.  [facility]  Medical Director [place] Director [facility] Cardio-Pulmonary Department      Plan to transfer to [facility]

## 2018-02-25 NOTE — Progress Notes (Signed)
SLP Cancellation Note  Patient Details Name: Betty [name] MRN: [id number] DOB: 07/21/1946   Cancelled treatment:       Reason Eval/Treat Not Completed: Patient not medically ready;Medical issues which prohibited therapy(chart reviewed; consulted NSG ). Patient emergently intubated due to hypercarbic respiratory failure; fail w/ BiPAP.  No further ST services indicated while intubated. ST services can be reconsulted when appropriate for BSE.    Orinda Kenner, MS, CCC-SLP Watson,Katherine 02/25/2018, 1:29 PM

## 2018-02-25 NOTE — Progress Notes (Signed)
PT Cancellation Note  Patient Details Name: LAVITA PONTIUS MRN: [id number] DOB: February 22, 1946   Cancelled Treatment:    Reason Eval/Treat Not Completed: Medical issues which prohibited therapy(Patient now intubated and sedated due to decline in respiratory status.  Initial therapy orders completed; please re-consult as medically appropriate.)   Lamesha Tibbits H. Owens Shark, PT, DPT, NCS 02/25/18, 8:21 AM [phone]

## 2018-02-26 DIAGNOSIS — J9600 Acute respiratory failure, unspecified whether with hypoxia or hypercapnia: Secondary | ICD-10-CM

## 2018-02-26 DIAGNOSIS — J449 Chronic obstructive pulmonary disease, unspecified: Secondary | ICD-10-CM

## 2018-02-26 LAB — GLUCOSE, CAPILLARY
GLUCOSE-CAPILLARY: 168 mg/dL — AB (ref 65–99)
GLUCOSE-CAPILLARY: 183 mg/dL — AB (ref 65–99)
Glucose-Capillary: 173 mg/dL — ABNORMAL HIGH (ref 65–99)
Glucose-Capillary: 147 mg/dL — ABNORMAL HIGH (ref 65–99)
Glucose-Capillary: 203 mg/dL — ABNORMAL HIGH (ref 65–99)
Glucose-Capillary: 184 mg/dL — ABNORMAL HIGH (ref 65–99)

## 2018-02-26 LAB — BLOOD GAS, ARTERIAL
ACID-BASE EXCESS: 16.1 mmol/L — AB (ref 0.0–2.0)
ACID-BASE EXCESS: 14.4 mmol/L — AB (ref 0.0–2.0)
ACID-BASE EXCESS: 13.2 mmol/L — AB (ref 0.0–2.0)
BICARBONATE: 40.9 mmol/L — AB (ref 20.0–28.0)
Bicarbonate: 44.2 mmol/L — ABNORMAL HIGH (ref 20.0–28.0)
Bicarbonate: 42.3 mmol/L — ABNORMAL HIGH (ref 20.0–28.0)
Delivery systems: POSITIVE
EXPIRATORY PAP: 6
FIO2: 40
FIO2: 60
FIO2: 0.35
Inspiratory PAP: 12
LHR: 24 {breaths}/min
O2 SAT: 99.5 %
O2 SAT: 93.7 %
PATIENT TEMPERATURE: 37
PATIENT TEMPERATURE: 37
PCO2 ART: 73 mmHg — AB (ref 32.0–48.0)
PCO2 ART: 63 mmHg — AB (ref 32.0–48.0)
PCO2 ART: 84 mmHg — AB (ref 32.0–48.0)
PEEP/CPAP: 5 cmH2O
PEEP/CPAP: 5 cmH2O
PH ART: 7.39 (ref 7.350–7.450)
PH ART: 7.42 (ref 7.350–7.450)
PH ART: 7.31 — AB (ref 7.350–7.450)
PO2 ART: 160 mmHg — AB (ref 83.0–108.0)
PO2 ART: 76 mmHg — AB (ref 83.0–108.0)
Patient temperature: 37
VT: 400 mL
VT: 400 mL

## 2018-02-26 LAB — CBC
HCT: 26.1 % — ABNORMAL LOW (ref 35.0–47.0)
Hemoglobin: 7.7 g/dL — ABNORMAL LOW (ref 12.0–16.0)
MCH: 25 pg — AB (ref 26.0–34.0)
MCHC: 29.3 g/dL — AB (ref 32.0–36.0)
MCV: 85.1 fL (ref 80.0–100.0)
PLATELETS: 284 10*3/uL (ref 150–440)
RBC: 3.07 MIL/uL — ABNORMAL LOW (ref 3.80–5.20)
RDW: 21 % — ABNORMAL HIGH (ref 11.5–14.5)
WBC: 8.9 10*3/uL (ref 3.6–11.0)

## 2018-02-26 LAB — BRAIN NATRIURETIC PEPTIDE: B Natriuretic Peptide: 386 pg/mL — ABNORMAL HIGH (ref 0.0–100.0)

## 2018-02-26 LAB — PROCALCITONIN: PROCALCITONIN: 0.15 ng/mL

## 2018-02-26 MED ORDER — ADULT MULTIVITAMIN LIQUID CH
15.0000 mL | Freq: Every day | ORAL | Status: DC
  Filled 2018-02-26 (×2): qty 15

## 2018-02-26 MED ORDER — PRO-STAT SUGAR FREE PO LIQD: 30.0000 mL | Freq: Two times a day (BID) | ORAL | Status: DC

## 2018-02-26 MED ORDER — IPRATROPIUM-ALBUTEROL 0.5-2.5 (3) MG/3ML IN SOLN
3.0000 mL | Freq: Four times a day (QID) | RESPIRATORY_TRACT | Status: DC
  Administered 2018-02-26 – 2018-03-01 (×12): 3 mL via RESPIRATORY_TRACT
  Filled 2018-02-26 (×12): qty 3

## 2018-02-26 MED ORDER — VITAL HIGH PROTEIN PO LIQD: 1000.0000 mL | ORAL | Status: DC

## 2018-02-26 MED ORDER — PRO-STAT SUGAR FREE PO LIQD: 60.0000 mL | Freq: Two times a day (BID) | ORAL | Status: DC

## 2018-02-26 MED ORDER — DEXTROSE 5 % IV SOLN
INTRAVENOUS | Status: DC
  Administered 2018-02-26 – 2018-02-28 (×4): via INTRAVENOUS

## 2018-02-26 NOTE — Clinical Social Work Note (Signed)
CSW updated Betty [name] at [facility] that patient did not transfer to [facility]. Patient to return to [facility] if appropriate at discharge. Shela Leff MSW,LCSW [phone]

## 2018-02-26 NOTE — Significant Event (Signed)
PM rounds:  Needs free water - D5W ordered.  Needs F/U labs AM 03/08 - ordered.  Needs nutrition - OGT ordered and TF protocol  Son updated @ [facility]. He does not want her to go to [facility] but still wants her to go to [facility] main hospital when a bed becomes available  Merton Border, MD PCCM service Mobile [phone] Pager [phone] 02/26/2018 2:29 PM

## 2018-02-26 NOTE — Progress Notes (Signed)
Pt has had continued oozing from previous venipuncture attempt of right femoral site.  Spoke with Marda Stalker NP, per [place]inton Dyer may place SafeGuard PAD device.

## 2018-02-26 NOTE — Progress Notes (Signed)
[place] at [place] NAME: Betty [name]    MR#:  [id number]  DATE OF BIRTH:  1946/08/01  SUBJECTIVE:  Patient remains intubated and sedated on vent.   Unable to obtain ROS as patient is intubated  Tolerating Diet: Nothing by mouth   DRUG ALLERGIES:   Allergies  Allergen Reactions  . Flecainide Shortness Of Breath and Other (See Comments)    Reaction: dizziness   . Amiodarone Other (See Comments)    Pt states that this medication causes lung bleeding.      VITALS:  Blood pressure 118/85, pulse (!) 123, temperature 99.8 F (37.7 C), temperature source Axillary, resp. rate 19, height 5\' 9"  (1.753 m), weight 98.2 kg (216 lb 7.9 oz), SpO2 95 %.  PHYSICAL EXAMINATION:  Constitutional: Patient is intubated and sedated with cervical collar and ecchymosis on face HENT: Normocephalic. Intubated Eyes:  no scleral icterus.  Neck: Cervical collar placed  CVS: Tachycardia S1/S2 +, no murmurs, no gallops, no carotid bruit.  Pulmonary: decreased breath sounds right base  Abdominal: Soft. BS +,  no distension, tenderness, rebound or guarding.  Musculoskeletal: No edema and no tenderness.  Neuro: Sedated on ventilator  Skin: Facial ecchymosis  Psychiatric: Sedated on ventilator  LABORATORY PANEL:   CBC Recent Labs  Lab 02/26/18 0529  WBC 8.9  HGB 7.7*  HCT 26.1*  PLT 284   ------------------------------------------------------------------------------------------------------------------  Chemistries  Recent Labs  Lab 02/23/18 0053  02/25/18 0452  NA 149*  --  156*  K 4.3  --  4.3  CL 106  --  115*  CO2 30  --  36*  GLUCOSE 165*  --  175*  BUN 17  --  27*  CREATININE 0.64  --  0.88  CALCIUM 8.6*  --  8.3*  MG 2.3   < > 2.6*  AST 321*  --   --   ALT 255*  --   --   ALKPHOS 124  --   --   BILITOT 2.5*  --   --    < > = values in this interval not displayed.    ------------------------------------------------------------------------------------------------------------------  Cardiac Enzymes No results for input(s): TROPONINI in the last 168 hours. ------------------------------------------------------------------------------------------------------------------  RADIOLOGY:  Dg Chest Port 1 View  Result Date: 02/25/2018 CLINICAL DATA:  Intubation. EXAM: PORTABLE CHEST 1 VIEW COMPARISON:  Radiographs 2 days prior 3148 FINDINGS: Endotracheal tube 4.5 cm from the carina at the thoracic inlet. 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 Unchanged cardiomegaly and mediastinal contours. Unchanged right pleural effusion. Worsening left pleural effusion and retrocardiac opacity. Slight improvement in pulmonary edema from prior exam. IMPRESSION: 1. Endotracheal tube 4.5 cm from the carina at the thoracic inlet. 2. Worsening left pleural effusion and retrocardiac opacity likely atelectasis. Unchanged right pleural effusion. 3. Slight improvement in pulmonary edema. Electronically Signed   By: Jeb Levering M.D.   On: 02/25/2018 03:54     ASSESSMENT AND PLAN:   72 year old female status post mechanical fall who suffered C1/C2 fracture and now in the ICU for acute encephalopathy with hypotension  1. Acute hypoxic/hypercapnic respiratory failure Patient is intubated and sedated on ventilator. Management as per intensivist  2. Acute encephalopathy in the setting of problem #1 and pain medications   3. Hypotension, unclear etiology: She was been weaned off of pressors 4. C1/C2 fracture after mechanical fall: Neurosurgery is recommending inpatient CTA to rule out vertebral artery injury Patient unstable this time Continue neck brace Patient will  need this neck brace for at least  3 months She will follow up with neurosurgery in 2-4 weeks.  5. Acute kidney injury: Creatinine has improved 6. Hyperkalemia: This has resolved 7. Leukocytosis of  unclear etiology This has improved  UA and chest x-ray do not show evidence of pneumonia  8. Vasculitis Patient on Imuran  9. Diabetes: Sliding scale insulin  10. PAF: Heart rate is still elevated and she is Currently on diltiazem drip Cardiology consultation for further recommendations and management Continue Rythmol and when necessary metoprolol Cont Lovenox  11. Acute on chronic diastolic heart failure: Lasix 20 mg IV 1 given  12. Elevated LFTs: This could be due to hepatic congestion from CHF   13. Hypernatremia due to poor by mouth intake Patient would benefit from D5W   14. Severe epistaxis from NG insertion: ENT consultation appreciated. Keep blood pressure stable and try to avoid hypertension which might increase bleeding. Recommend Staphylococcus prophylaxis to reduce risk of toxic shock while packing is in place. Recommendations for packing for at least 5 days.D2/5     Family requesting transfer to [place] bed no beds at [facility] this morning as per my conversation with nursing staff    CODE STATUS: FULL  TOTAL TIME TAKING CARE OF THIS PATIENT: 17 minutes.   [name].  POSSIBLE D/C ??, DEPENDING ON CLINICAL CONDITION.   Ellesse Antenucci M.D on 02/26/2018 at 11:17 AM  Between 7am to 6pm - Pager - [phone] After 6pm go to [email or web address] - password [email or web address] [place] [facility]  Office  [phone]  CC: Primary care physician; Dion Body, MD  Note: This dictation was prepared with Dragon dictation along with smaller phrase technology. Any transcriptional errors that result from this process are unintentional.

## 2018-02-26 NOTE — Progress Notes (Signed)
   02/26/18 1135  Clinical Encounter Type  Visited With Family  Visit Type Follow-up   Checked in with patient family; no needs to report per family.  Chaplain encouraged family to reach out if needs changed.

## 2018-02-26 NOTE — Progress Notes (Signed)
CC follow up mental status changes Severe resp failure  SUBJECTIVE Patient emergently intubated,sedated +nose bleed from NG insertion ENT placed packing Stopped bleeding Now critically ill Prognosis is guarded High risk for cardiac arrest Son updated yesterday about clinical condition   Vitals:   02/26/18 0700 02/26/18 0730 02/26/18 0734 02/26/18 0750  BP: 125/60 129/69    Pulse: (!) 121 (!) 129    Resp: 14 19    Temp:    99.8 F (37.7 C)  TempSrc:    Axillary  SpO2: 97% 98% 96%   Weight:      Height:        ROS unobtainable due to critical illness   HEENT: intubated, on vent, facial eccymosis Left norstil balloon in place Neck: Cervical collar in place Chest: Bilateral crackles, no wheezes Cardiac: Regular, mechanical first heart sound, no M Abdomen: Soft, bowel sounds present, nontender Extremities: Warm, no edema Neuro: Cranial nerves intact, no focal deficits, diffusely weak   BMP Latest Ref Rng & Units 02/25/2018 02/23/2018 02/22/2018  Glucose 65 - 99 mg/dL 175(H) 165(H) 127(H)  BUN 6 - 20 mg/dL 27(H) 17 27(H)  Creatinine 0.44 - 1.00 mg/dL 0.88 0.64 0.68  Sodium 135 - 145 mmol/L 156(H) 149(H) 143  Potassium 3.5 - 5.1 mmol/L 4.3 4.3 4.0  Chloride 101 - 111 mmol/L 115(H) 106 103  CO2 22 - 32 mmol/L 36(H) 30 30  Calcium 8.9 - 10.3 mg/dL 8.3(L) 8.6(L) 8.5(L)    CBC Latest Ref Rng & Units 02/26/2018 02/25/2018 02/25/2018  WBC 3.6 - 11.0 K/uL 8.9 8.5 -  Hemoglobin 12.0 - 16.0 g/dL 7.7(L) 7.9(L) 8.1(L)  Hematocrit 35.0 - 47.0 % 26.1(L) 26.5(L) 27.2(L)  Platelets 150 - 440 K/uL 284 291 -    CXR: NSC diffuse bilateral symmetric interstitial prominence  72 yo obese white female with end stage COPD with acute neck fracture nons-urgical now with severe and progressive resp failure from pulm edema and afib with RVR with h/o Mech valve with acute and severe COPD exacerbation   1.Respiratory Failure -continue Full MV support -continue Bronchodilator Therapy -Wean Fio2 and  PEEP as tolerated  2.neck fracture Acute nondisplaced cervical spine fracture after fall Follow up neuro surgery recs  3.Chronic interstitial lung disease IV steroids  4.History of mechanical mitral valve replacement H/o afib on cardizem infusion HOLD AC for now Follow up cardiology recs  5.History of ANCA positive vasculitis  6.- intubated and sedated - minimal sedation to achieve a RASS goal: -1  7.severe epistaxis from NG insertion Appreciate  ENT recs   Critical Care Time devoted to patient care services described in this note is 32 minutes.   Overall, patient is critically ill, prognosis is guarded.  Patient with Multiorgan failure and at high risk for cardiac arrest and death.    Plan to transfer to [place], M.D.  [facility]  Medical Director [place] Director [facility] Cardio-Pulmonary Department

## 2018-02-26 NOTE — Progress Notes (Signed)
Pt remains on vent, no vent settings changes (24, 40%, 400, 5 peep).  Remains sedated with fentanyl gtt, withdraws to pain.  Prn fentanyl bolus and prn versed pushes required to maintain RASS goal and pt comfort.  A-fib/V-paced on monitor, rate ranges from 115-135, remains on cardizem gtt at 15 mg to assist with rate control.  Vitals signs stable, low grade temperature with tmax of 100.3.  Adequate urine output from foley, 650 ml this shift.

## 2018-02-26 NOTE — Progress Notes (Signed)
Right femoral site continued to ooz despite PAD device in place.  Marda Stalker NP, placed stitch to site.  PAD removed.  Site has ceased oozing since placement of stitch.  Per Hinton Dyer, nursing to remove stitch in 5 days on 03/03/18.  Continue to monitor site for oozing and bleeding.  Order to discontinue SafeGuard PAD device.

## 2018-02-26 NOTE — Progress Notes (Signed)
RN made Dr. Mortimer Fries aware in person that patient's co2 on abg is 64.  MD acknowledged and gave no new orders at this time.

## 2018-02-26 NOTE — Progress Notes (Signed)
Attempted to place OG tube and was unsuccessful.  While attempting to insert OG tube patient started having bright red blood from mouth therefore RN stopped trying to insert tube.  With attempts tube kept coiling in patient's mouth.

## 2018-02-27 ENCOUNTER — Inpatient Hospital Stay: Payer: Medicare Other

## 2018-02-27 LAB — BASIC METABOLIC PANEL
ANION GAP: 8 (ref 5–15)
ANION GAP: 8 (ref 5–15)
BUN: 47 mg/dL — AB (ref 6–20)
BUN: 42 mg/dL — AB (ref 6–20)
CALCIUM: 7.7 mg/dL — AB (ref 8.9–10.3)
CHLORIDE: 113 mmol/L — AB (ref 101–111)
CO2: 34 mmol/L — ABNORMAL HIGH (ref 22–32)
CO2: 33 mmol/L — ABNORMAL HIGH (ref 22–32)
Calcium: 7.7 mg/dL — ABNORMAL LOW (ref 8.9–10.3)
Chloride: 116 mmol/L — ABNORMAL HIGH (ref 101–111)
Creatinine, Ser: 0.98 mg/dL (ref 0.44–1.00)
Creatinine, Ser: 0.92 mg/dL (ref 0.44–1.00)
GFR calc Af Amer: >60 mL/min (ref >60)
GFR calc Af Amer: >60 mL/min (ref >60)
GFR, EST NON AFRICAN AMERICAN: 57 mL/min — AB (ref >60)
GLUCOSE: 254 mg/dL — AB (ref 65–99)
Glucose, Bld: 241 mg/dL — ABNORMAL HIGH (ref 65–99)
POTASSIUM: 4.3 mmol/L (ref 3.5–5.1)
POTASSIUM: 4.3 mmol/L (ref 3.5–5.1)
SODIUM: 158 mmol/L — AB (ref 135–145)
Sodium: 154 mmol/L — ABNORMAL HIGH (ref 135–145)

## 2018-02-27 LAB — CBC WITH DIFFERENTIAL/PLATELET
Basophils Absolute: 0 10*3/uL (ref 0–0.1)
Basophils Relative: 0 %
Eosinophils Absolute: 0 10*3/uL (ref 0–0.7)
Eosinophils Relative: 0 %
HCT: 27.1 % — ABNORMAL LOW (ref 35.0–47.0)
HEMOGLOBIN: 8.2 g/dL — AB (ref 12.0–16.0)
LYMPHS PCT: 1 %
Lymphs Abs: 0.1 10*3/uL — ABNORMAL LOW (ref 1.0–3.6)
MCH: 25.8 pg — AB (ref 26.0–34.0)
MCHC: 30.3 g/dL — AB (ref 32.0–36.0)
MCV: 85 fL (ref 80.0–100.0)
MONO ABS: 0.6 10*3/uL (ref 0.2–0.9)
Monocytes Relative: 5 %
NEUTROS ABS: 11.2 10*3/uL — AB (ref 1.4–6.5)
Neutrophils Relative %: 94 %
Platelets: 220 10*3/uL (ref 150–440)
RBC: 3.18 MIL/uL — ABNORMAL LOW (ref 3.80–5.20)
RDW: 19.5 % — ABNORMAL HIGH (ref 11.5–14.5)
WBC: 12 10*3/uL — AB (ref 3.6–11.0)

## 2018-02-27 LAB — CBC
HCT: 23.1 % — ABNORMAL LOW (ref 35.0–47.0)
Hemoglobin: 6.7 g/dL — ABNORMAL LOW (ref 12.0–16.0)
MCH: 25 pg — AB (ref 26.0–34.0)
MCHC: 29.2 g/dL — ABNORMAL LOW (ref 32.0–36.0)
MCV: 85.6 fL (ref 80.0–100.0)
PLATELETS: 219 10*3/uL (ref 150–440)
RBC: 2.7 MIL/uL — AB (ref 3.80–5.20)
RDW: 21.1 % — AB (ref 11.5–14.5)
WBC: 8.9 10*3/uL (ref 3.6–11.0)

## 2018-02-27 LAB — GLUCOSE, CAPILLARY
GLUCOSE-CAPILLARY: 212 mg/dL — AB (ref 65–99)
GLUCOSE-CAPILLARY: 214 mg/dL — AB (ref 65–99)
Glucose-Capillary: 203 mg/dL — ABNORMAL HIGH (ref 65–99)
Glucose-Capillary: 225 mg/dL — ABNORMAL HIGH (ref 65–99)
Glucose-Capillary: 229 mg/dL — ABNORMAL HIGH (ref 65–99)
Glucose-Capillary: 250 mg/dL — ABNORMAL HIGH (ref 65–99)

## 2018-02-27 LAB — CULTURE, RESPIRATORY W GRAM STAIN
Culture: NORMAL
Gram Stain: NONE SEEN

## 2018-02-27 LAB — OSMOLALITY: Osmolality: 344 mOsm/kg (ref 275–295)

## 2018-02-27 LAB — MAGNESIUM: Magnesium: 2.8 mg/dL — ABNORMAL HIGH (ref 1.7–2.4)

## 2018-02-27 LAB — PREPARE RBC (CROSSMATCH)

## 2018-02-27 LAB — OSMOLALITY, URINE: OSMOLALITY UR: 470 mosm/kg (ref 300–900)

## 2018-02-27 LAB — PHOSPHORUS: PHOSPHORUS: 2.4 mg/dL — AB (ref 2.5–4.6)

## 2018-02-27 LAB — SODIUM, URINE, RANDOM

## 2018-02-27 MED ORDER — INSULIN ASPART 100 UNIT/ML ~~LOC~~ SOLN
0.0000 [IU] | SUBCUTANEOUS | Status: DC
  Administered 2018-02-27 (×4): 7 [IU] via SUBCUTANEOUS
  Administered 2018-02-28: 11 [IU] via SUBCUTANEOUS
  Filled 2018-02-27 (×5): qty 1

## 2018-02-27 MED ORDER — BISACODYL 10 MG RE SUPP
10.0000 mg | Freq: Every day | RECTAL | Status: DC | PRN
  Administered 2018-03-01: 10 mg via RECTAL
  Filled 2018-02-27 (×3): qty 1

## 2018-02-27 MED ORDER — MIDAZOLAM HCL 2 MG/2ML IJ SOLN
1.0000 mg | Freq: Once | INTRAMUSCULAR | Status: AC
  Administered 2018-02-27: 1 mg via INTRAVENOUS
  Filled 2018-02-27: qty 2

## 2018-02-27 MED ORDER — POTASSIUM PHOSPHATES 15 MMOLE/5ML IV SOLN
10.0000 mmol | Freq: Once | INTRAVENOUS | Status: AC
  Administered 2018-02-27: 10 mmol via INTRAVENOUS
  Filled 2018-02-27: qty 3.33

## 2018-02-27 MED ORDER — THIAMINE HCL 100 MG/ML IJ SOLN
INTRAVENOUS | Status: AC
  Administered 2018-02-27: 19:00:00 via INTRAVENOUS
  Filled 2018-02-27: qty 960

## 2018-02-27 MED ORDER — SODIUM CHLORIDE 0.9 % IV SOLN: Freq: Once | INTRAVENOUS | Status: DC

## 2018-02-27 NOTE — Consult Note (Signed)
Apparently had bleeding from throat today after attempted OG placement, but nursing staff does not report any further active bleeding from the nose since packing. Examined and pack intact with some old clot in the right nostril but no active bleeding and no fresh blood in oropharynx at this point. Will leave packs in place until Monday and then remove. I will leave the risk/benefit of resuming anticoagulant therapy up to the managing service, but obviously should choose therapy that is quickly reversible.

## 2018-02-27 NOTE — Progress Notes (Signed)
Pulmonary / [place] Intubated,sedated cervical collar in place Nasal packing per ENT. No further evidence of nasal bleeding noted  Vitals:   02/27/18 0745 02/27/18 0800 02/27/18 0900 02/27/18 1000  BP:  (!) 99/49 (!) 105/56 104/61  Pulse:  (!) 103 99 (!) 108  Resp:  (!) 24 (!) 24 (!) 24  Temp: 99.7 F (37.6 C)     TempSrc: Axillary     SpO2:  97% 97% 98%  Weight:      Height:        ROS unobtainable due to critical illness   HEENT: intubated, on vent, facial eccymosis Left norstil balloon in place Neck: Cervical collar in place Chest: Bilateral crackles, no wheezes Cardiac: Regular, mechanical first heart sound, no M Abdomen: Soft, bowel sounds present, nontender Extremities: Warm, no edema Neuro: Cranial nerves intact, no focal deficits, diffusely weak   BMP Latest Ref Rng & Units 02/27/2018 02/25/2018 02/23/2018  Glucose 65 - 99 mg/dL 241(H) 175(H) 165(H)  BUN 6 - 20 mg/dL 47(H) 27(H) 17  Creatinine 0.44 - 1.00 mg/dL 0.98 0.88 0.64  Sodium 135 - 145 mmol/L 158(H) 156(H) 149(H)  Potassium 3.5 - 5.1 mmol/L 4.3 4.3 4.3  Chloride 101 - 111 mmol/L 116(H) 115(H) 106  CO2 22 - 32 mmol/L 34(H) 36(H) 30  Calcium 8.9 - 10.3 mg/dL 7.7(L) 8.3(L) 8.6(L)    CBC Latest Ref Rng & Units 02/27/2018 02/26/2018 02/25/2018  WBC 3.6 - 11.0 K/uL 8.9 8.9 8.5  Hemoglobin 12.0 - 16.0 g/dL 6.7(L) 7.7(L) 7.9(L)  Hematocrit 35.0 - 47.0 % 23.1(L) 26.1(L) 26.5(L)  Platelets 150 - 440 K/uL 219 284 291    CXR: NSC diffuse bilateral symmetric interstitial prominence  72 yo obese white female with end stage COPD with acute neck fracture nons-urgical now with severe and progressive resp failure from pulm edema and afib with RVR with h/o Mech valve with acute and severe COPD exacerbation   1.Respiratory Failure -continue Full MV support -continue Bronchodilator Therapy -Wean Fio2 and PEEP as tolerated  2.neck fracture Acute nondisplaced cervical spine fracture after fall Follow up  neuro surgery recs Patient has been accepted to [facility].  No beds are available at this time pending transfer  3.Chronic interstitial lung disease IV steroids  4.History of mechanical mitral valve replacement H/o afib on cardizem infusion HOLD AC for now Follow up cardiology recs  5.History of ANCA positive vasculitis  6.- intubated and sedated - minimal sedation to achieve a RASS goal: -1  7.severe epistaxis from NG insertion Appreciate  ENT recs  8. Anemia with hemoglobin of 6.7. Most likelyequilibrating from nosebleed. Will discuss with family and transfuse packed rd blood cell  9. Hypernatremia. Will check urinary sodium, urine on some, will start on D5W. May require DDAVP   Critical Care Time devoted to patient care services described in this note is 40 minutes.   Overall, patient is critically ill, prognosis is guarded.  Patient with Multiorgan failure and at high risk for cardiac arrest and death.    Plan to transfer to [facility], DO

## 2018-02-27 NOTE — Progress Notes (Signed)
Nutrition Follow Up Note   DOCUMENTATION CODES:   Obesity unspecified  INTERVENTION:   Clinimix 5/15 without electrolytes at 53ml/hr (goal rate 91ml/hr once labs stabilize)  Hold 20% lipids for 7 days per critical care guidelines   Regimen at goal provides 1108kcal/day, 78g/day protein, 153ml volume  Add MVI and trace elements daily   Add IV thiamine 100mg  daily x 3 days   Pt at refeeding risk; recommend monitor P, K, and Mg daily until labs stabilize  Daily weights  Per pharmacy will decrease IVF rate to 112ml/hr- will provide 408kcal/day   NUTRITION DIAGNOSIS:   Inadequate oral intake related to inability to eat as evidenced by NPO status.  GOAL:   Provide needs based on ASPEN/SCCM guidelines  MONITOR:   Vent status, Labs, Weight trends, I & O's, Skin, Other (Comment)(TPN)  ASSESSMENT:   72 year old female with PMHx of OA, goiter, esophageal reflux, A-fib, vasculitis, cardiomegaly, diastolic heart failure, HTN, asthma, DM type 2, COPD who was admitted on 2/28 from [facility] after unwitnessed fall with head injury, found to have C 1-2 fractures in a brace, and was transferred to ICU on 3/1 for worsening hypoxemic respiratory failure and encephalopathy initially requiring BiPAP, but patient was emergently intubated evening of 3/5 after failing BiPAP.   Pt unable to have OG/NG tube placed. Pt on day 7 without adequate nutrition; plan to initiate TPN today. Per chart, pt with 22lb weight loss since admit; pt appears to be back to his UBW. Can advance TPN to goal rate 3/9 if labs stable. Monitor glucose.     Medications reviewed and include: azathioprine, oscal w/ D, colace, solu-medrol, protonix, senokot, unasyn, 5% dextrose @150ml /hr, K Phos  Labs reviewed: Na 158(H), BUN 47(H), Ca 7.7(L), P 2.4(L), Mg 2.8(H) BNP 386(H)- 3/7 Hgb 6.7(L), Hct 23.1(L) cbgs- 175, 241 x 48hrs  Patient is currently intubated on ventilator support MV: 21.6 L/min Temp (24hrs),  Avg:100.2 F (37.9 C), Min:99.5 F (37.5 C), Max:100.8 F (38.2 C)  Propofol: none  MAP- >96mmHg  Diet Order:  Aspiration precautions  EDUCATION NEEDS:   No education needs have been identified at this time  Skin:  Skin Assessment: Skin Integrity Issues:(MSAD to groin; skin tear left knee; scattered ecchymosis; abrasion to head)  Last BM:  02/19/2018; NO DOCUMENTED BM THIS ADMISSION  Height:   Ht Readings from Last 1 Encounters:  02/24/18 5\' 9"  (1.753 m)    Weight:   Wt Readings from Last 1 Encounters:  02/27/18 207 lb 0.2 oz (93.9 kg)    Ideal Body Weight:  65.9 kg  BMI:  Body mass index is 30.57 kg/m.  Estimated Nutritional Needs:   Kcal:  3875-6433 (11-14 kcal/kg)  Protein:  >/= 132 grams (>/= 2 grams/kg IBW)  Fluid:  1.6-2 L/day (25-30 mL/kg IBW)  Koleen Distance MS, RD, LDN Pager #- [phone] After Hours Pager: [phone]

## 2018-02-27 NOTE — Progress Notes (Signed)
[place] NOTE   Pharmacy Consult for TPN management   Patient Measurements: Height: 5\' 9"  (175.3 cm) Weight: 207 lb 0.2 oz (93.9 kg) IBW/kg (Calculated) : 66.2 TPN AdjBW (KG): 73.9 Body mass index is 30.57 kg/m.  Assessment:  Pharmacy consulted for TPN management for 72 yo female ICU patient requiring mechanical ventilation. Patient failed multiple attempts for tube placement and is to be started on TPN. Patient is on day 7 without adequate nutrition. Patient is currently hypernatremic and is receiving D5 at 116mL/hr.    Current Nutrition:   Plan:  Will start patient on Clinimix E 5/15 at 71mL/hr and advance to goal of 22mL/hr on 3/9. Will hold lipids x 7 days.  Will add MVI and trace elements to TPN. Thiamine 100mg  x 3 days.  SSI Q4hr. (patient on steroids and dextrose 5%).  Will monitor labs per policy.   Pharmacy will continue to monitor and adjust per consult.   Johnice Riebe L 02/27/2018,7:31 PM

## 2018-02-27 NOTE — Progress Notes (Signed)
[place] at [place] NAME: Betty [name]    MR#:  [id number]  DATE OF BIRTH:  27-Aug-1946  SUBJECTIVE:  Patient remains intubated and sedated on vent.   OGT attempted however patient started having red blood from mouth  Review of Systems  Unable to perform ROS: Acuity of condition     DRUG ALLERGIES:   Allergies  Allergen Reactions  . Flecainide Shortness Of Breath and Other (See Comments)    Reaction: dizziness   . Amiodarone Other (See Comments)    Pt states that this medication causes lung bleeding.      VITALS:  Blood pressure 104/61, pulse (!) 108, temperature 99.7 F (37.6 C), temperature source Axillary, resp. rate (!) 24, height 5\' 9"  (1.753 m), weight 93.9 kg (207 lb 0.2 oz), SpO2 98 %.  PHYSICAL EXAMINATION:  Constitutional: Patient is intubated and sedated with cervical collar and ecchymosis on face HENT: Normocephalic. Intubated Eyes:  no scleral icterus.  Neck: Cervical collar placed  CVS: Tachycardia S1/S2 +, no murmurs, no gallops, no carotid bruit.  Pulmonary: decreased breath sounds right base  Abdominal: Soft. BS +,  no distension, tenderness, rebound or guarding.  Musculoskeletal: No edema and no tenderness.  Neuro: Sedated on ventilator  Skin: Facial ecchymosis  Psychiatric: Sedated on ventilator  LABORATORY PANEL:   CBC Recent Labs  Lab 02/27/18 0430  WBC 8.9  HGB 6.7*  HCT 23.1*  PLT 219   ------------------------------------------------------------------------------------------------------------------  Chemistries  Recent Labs  Lab 02/23/18 0053  02/27/18 0430  NA 149*   < > 158*  K 4.3   < > 4.3  CL 106   < > 116*  CO2 30   < > 34*  GLUCOSE 165*   < > 241*  BUN 17   < > 47*  CREATININE 0.64   < > 0.98  CALCIUM 8.6*   < > 7.7*  MG 2.3   < > 2.8*  AST 321*  --   --   ALT 255*  --   --   ALKPHOS 124  --   --   BILITOT 2.5*  --   --    < > = values in this interval not displayed.    ------------------------------------------------------------------------------------------------------------------  Cardiac Enzymes No results for input(s): TROPONINI in the last 168 hours. ------------------------------------------------------------------------------------------------------------------  RADIOLOGY:  Dg Chest Port 1 View  Result Date: 02/27/2018 CLINICAL DATA:  Respiratory failure EXAM: PORTABLE CHEST 1 VIEW COMPARISON:  02/25/2018 FINDINGS: Endotracheal tube in good position. Underlying COPD. Right lower lobe airspace disease and small right effusion unchanged. Partial clearing of left lower lobe airspace disease. Small left effusion unchanged. IMPRESSION: Endotracheal tube in good position. Right lower lobe airspace disease and small right effusion unchanged Improved aeration left lung base. Electronically Signed   By: Franchot Gallo M.D.   On: 02/27/2018 07:14     ASSESSMENT AND PLAN:   72 year old female status post mechanical fall who suffered C1/C2 fracture and now in the ICU for acute encephalopathy with hypotension  1. Acute hypoxic/hypercapnic respiratory failure Patient is intubated and sedated on ventilator. Management as per intensivist  2. Acute encephalopathy in the setting of problem #1 and pain medications   3. Hypotension, unclear etiology: She was been weaned off of pressors 4. C1/C2 fracture after mechanical fall: Neurosurgery is recommending inpatient CTA to rule out vertebral artery injury Patient unstable this time Continue neck brace Patient will need this neck brace for at least  3  months She will follow up with neurosurgery in 2-4 weeks.  5. Acute kidney injury: Creatinine has improved 6. Hyperkalemia: This has resolved 7. Leukocytosis of unclear etiology This has improved  UA and chest x-ray do not show evidence of pneumonia  8. Vasculitis Patient on Imuran  9. Diabetes: Sliding scale insulin  10. PAF: Heart rate is still  elevated and she is Currently on diltiazem drip Continue Rythmol and when necessary metoprolol   11. Acute on chronic diastolic heart failure: Lasix 20 mg IV 1 given  12. Elevated LFTs: This could be due to hepatic congestion from CHF   13. Hypernatremia due to poor by mouth intake Continue D5 BMP in a.m.   14. Severe epistaxis from NG insertion: ENT consultation appreciated. Keep blood pressure stable and try to avoid hypertension which might increase bleeding. Recommend Staphylococcus prophylaxis to reduce risk of toxic shock while packing is in place. Recommendations for packing for at least 5 days.D3/5  15. Severe anemia with acute blood loss: Patient will require blood transfusion   16. Nutrition: Unable to place OG tube May need TPN  Patient with overall  poor prognosis per she would benefit from palliative care consultation  Family requesting transfer to [place] bed no beds at [facility] this morning as per my conversation with nursing staff    CODE STATUS: FULL  TOTAL TIME TAKING CARE OF THIS PATIENT: 17 minutes.   [name].  POSSIBLE D/C ??, DEPENDING ON CLINICAL CONDITION.   Tarynn Garling M.D on 02/27/2018 at 11:02 AM  Between 7am to 6pm - Pager - [phone] After 6pm go to [email or web address] - password [email or web address] [facility]  Office  [phone]  CC: Primary care physician; Dion Body, MD  Note: This dictation was prepared with Dragon dictation along with smaller phrase technology. Any transcriptional errors that result from this process are unintentional.

## 2018-02-28 ENCOUNTER — Inpatient Hospital Stay: Payer: Medicare Other

## 2018-02-28 LAB — BPAM RBC
Blood Product Expiration Date: 201903252359
ISSUE DATE / TIME: 201903081158
Unit Type and Rh: 6200

## 2018-02-28 LAB — TYPE AND SCREEN
ABO/RH(D): A POS
ANTIBODY SCREEN: NEGATIVE
Unit division: 0

## 2018-02-28 LAB — COMPREHENSIVE METABOLIC PANEL
ALT: 51 U/L (ref 14–54)
AST: 50 U/L — AB (ref 15–41)
Albumin: 3.1 g/dL — ABNORMAL LOW (ref 3.5–5.0)
Alkaline Phosphatase: 69 U/L (ref 38–126)
Anion gap: 4 — ABNORMAL LOW (ref 5–15)
BILIRUBIN TOTAL: 1.3 mg/dL — AB (ref 0.3–1.2)
BUN: 38 mg/dL — AB (ref 6–20)
CO2: 33 mmol/L — ABNORMAL HIGH (ref 22–32)
CREATININE: 0.86 mg/dL (ref 0.44–1.00)
Calcium: 7.4 mg/dL — ABNORMAL LOW (ref 8.9–10.3)
Chloride: 113 mmol/L — ABNORMAL HIGH (ref 101–111)
GFR calc Af Amer: >60 mL/min (ref >60)
Glucose, Bld: 298 mg/dL — ABNORMAL HIGH (ref 65–99)
Potassium: 4.5 mmol/L (ref 3.5–5.1)
Sodium: 150 mmol/L — ABNORMAL HIGH (ref 135–145)
TOTAL PROTEIN: 5.5 g/dL — AB (ref 6.5–8.1)

## 2018-02-28 LAB — CBC
HCT: 25.5 % — ABNORMAL LOW (ref 35.0–47.0)
Hemoglobin: 7.6 g/dL — ABNORMAL LOW (ref 12.0–16.0)
MCH: 26 pg (ref 26.0–34.0)
MCHC: 29.9 g/dL — AB (ref 32.0–36.0)
MCV: 87 fL (ref 80.0–100.0)
PLATELETS: 176 10*3/uL (ref 150–440)
RBC: 2.93 MIL/uL — ABNORMAL LOW (ref 3.80–5.20)
RDW: 19.7 % — AB (ref 11.5–14.5)
WBC: 12.3 10*3/uL — ABNORMAL HIGH (ref 3.6–11.0)

## 2018-02-28 LAB — GLUCOSE, CAPILLARY
GLUCOSE-CAPILLARY: 256 mg/dL — AB (ref 65–99)
GLUCOSE-CAPILLARY: 222 mg/dL — AB (ref 65–99)
GLUCOSE-CAPILLARY: 232 mg/dL — AB (ref 65–99)
Glucose-Capillary: 258 mg/dL — ABNORMAL HIGH (ref 65–99)
Glucose-Capillary: 218 mg/dL — ABNORMAL HIGH (ref 65–99)

## 2018-02-28 LAB — BASIC METABOLIC PANEL
Anion gap: 4 — ABNORMAL LOW (ref 5–15)
BUN: 33 mg/dL — ABNORMAL HIGH (ref 6–20)
CHLORIDE: 113 mmol/L — AB (ref 101–111)
CO2: 33 mmol/L — ABNORMAL HIGH (ref 22–32)
Calcium: 7.6 mg/dL — ABNORMAL LOW (ref 8.9–10.3)
Creatinine, Ser: 0.78 mg/dL (ref 0.44–1.00)
GFR calc non Af Amer: >60 mL/min (ref >60)
Glucose, Bld: 286 mg/dL — ABNORMAL HIGH (ref 65–99)
POTASSIUM: 4.5 mmol/L (ref 3.5–5.1)
SODIUM: 150 mmol/L — AB (ref 135–145)

## 2018-02-28 LAB — DIFFERENTIAL
Basophils Absolute: 0.2 10*3/uL — ABNORMAL HIGH (ref 0–0.1)
Basophils Relative: 1 %
EOS ABS: 0 10*3/uL (ref 0–0.7)
EOS PCT: 0 %
Lymphocytes Relative: 1 %
Lymphs Abs: 0.1 10*3/uL — ABNORMAL LOW (ref 1.0–3.6)
MONO ABS: 0.7 10*3/uL (ref 0.2–0.9)
Monocytes Relative: 6 %
NEUTROS PCT: 92 %
Neutro Abs: 11.4 10*3/uL — ABNORMAL HIGH (ref 1.4–6.5)

## 2018-02-28 LAB — MAGNESIUM: MAGNESIUM: 2.6 mg/dL — AB (ref 1.7–2.4)

## 2018-02-28 LAB — PHOSPHORUS: PHOSPHORUS: 3.1 mg/dL (ref 2.5–4.6)

## 2018-02-28 LAB — TRIGLYCERIDES: Triglycerides: 82 mg/dL (ref <150)

## 2018-02-28 LAB — PROTIME-INR
INR: 1.24
Prothrombin Time: 15.5 seconds — ABNORMAL HIGH (ref 11.4–15.2)

## 2018-02-28 MED ORDER — SODIUM CHLORIDE 0.9 % IV SOLN: INTRAVENOUS | Status: DC

## 2018-02-28 MED ORDER — INSULIN ASPART 100 UNIT/ML ~~LOC~~ SOLN
0.0000 [IU] | SUBCUTANEOUS | Status: DC
  Administered 2018-02-28 (×3): 7 [IU] via SUBCUTANEOUS
  Administered 2018-02-28: 11 [IU] via SUBCUTANEOUS
  Administered 2018-03-01 (×5): 7 [IU] via SUBCUTANEOUS
  Filled 2018-02-28 (×9): qty 1

## 2018-02-28 MED ORDER — TRACE MINERALS CR-CU-MN-SE-ZN 10-1000-500-60 MCG/ML IV SOLN
INTRAVENOUS | Status: DC
  Administered 2018-02-28: 18:00:00 via INTRAVENOUS
  Filled 2018-02-28 (×2): qty 1560

## 2018-02-28 MED ORDER — SODIUM CHLORIDE 0.9 % IV SOLN
INTRAVENOUS | Status: DC
  Filled 2018-02-28: qty 1

## 2018-02-28 MED ORDER — AMIODARONE LOAD VIA INFUSION
150.0000 mg | Freq: Once | INTRAVENOUS | Status: AC
  Administered 2018-02-28: 150 mg via INTRAVENOUS
  Filled 2018-02-28: qty 83.34

## 2018-02-28 MED ORDER — INSULIN REGULAR BOLUS VIA INFUSION
0.0000 [IU] | Freq: Three times a day (TID) | INTRAVENOUS | Status: DC
  Filled 2018-02-28: qty 10

## 2018-02-28 MED ORDER — AMIODARONE HCL IN DEXTROSE 360-4.14 MG/200ML-% IV SOLN
60.0000 mg/h | INTRAVENOUS | Status: DC
  Administered 2018-02-28: 60 mg/h via INTRAVENOUS
  Filled 2018-02-28 (×2): qty 200

## 2018-02-28 MED ORDER — AMIODARONE HCL IN DEXTROSE 360-4.14 MG/200ML-% IV SOLN: 30.0000 mg/h | INTRAVENOUS | Status: DC

## 2018-02-28 NOTE — Progress Notes (Signed)
[place] at [place] NAME: Betty [name]    MR#:  [id number]  DATE OF BIRTH:  [birth year]  SUBJECTIVE:  CHIEF COMPLAINT:   Chief Complaint  Patient presents with  . Fall  . Head Injury   -Patient was recently discharged from the hospital to rehabilitation, comes back after a fall and C1/C2 neck fracture. Nonsurgical management was recommended. Patient went into acute respiratory distress and has been on the ventilator since 02/25/2018. -Significant epistaxis -unable to pass NG tube or OG-tube due to bleeding  REVIEW OF SYSTEMS:  Review of Systems  Unable to perform ROS: Critical illness    DRUG ALLERGIES:   Allergies  Allergen Reactions  . Flecainide Shortness Of Breath and Other (See Comments)    Reaction: dizziness   . Amiodarone Other (See Comments)    Pt states that this medication causes lung bleeding.      VITALS:  Blood pressure 127/74, pulse (!) 110, temperature 99.8 F (37.7 C), temperature source Axillary, resp. rate (!) 23, height 5\' 9"  (1.753 m), weight 98.3 kg (216 lb 11.4 oz), SpO2 99 %.  PHYSICAL EXAMINATION:  Physical Exam  GENERAL:  72 y.o.-year-old patient lying in the bed, critically ill appearing EYES: Pupils equal, round, reactive to light and accommodation. No scleral icterus. Extraocular muscles intact.  HEENT: Head  normocephalic. Multiple facial bruising noted. Old blood noted around both nares, there is packing in the left nare.  NECK:  Supple, in a brace. No thyroid enlargement, no tenderness.  LUNGS: Normal breath sounds bilaterally, no wheezing, rales,rhonchi or crepitation. No use of accessory muscles of respiration. Decreased at the bases CARDIOVASCULAR: S1, S2 normal. No  rubs, or gallops. Loud 4/6 systolic murmur noted ABDOMEN: Soft, nontender, nondistended. Bowel sounds present. No organomegaly or mass.  EXTREMITIES: No pedal edema, cyanosis, or clubbing.  NEUROLOGIC: intubated and sedated    PSYCHIATRIC: The patient is sedated.  SKIN: No obvious rash, lesion, or ulcer. Multiple bruises noted all over   LABORATORY PANEL:   CBC Recent Labs  Lab 02/28/18 0436  WBC 12.3*  HGB 7.6*  HCT 25.5*  PLT 176   ------------------------------------------------------------------------------------------------------------------  Chemistries  Recent Labs  Lab 02/28/18 0436  NA 150*  K 4.5  CL 113*  CO2 33*  GLUCOSE 298*  BUN 38*  CREATININE 0.86  CALCIUM 7.4*  MG 2.6*  AST 50*  ALT 51  ALKPHOS 69  BILITOT 1.3*   ------------------------------------------------------------------------------------------------------------------  Cardiac Enzymes No results for input(s): TROPONINI in the last 168 hours. ------------------------------------------------------------------------------------------------------------------  RADIOLOGY:  Dg Chest Port 1 View  Result Date: 02/27/2018 CLINICAL DATA:  Respiratory failure EXAM: PORTABLE CHEST 1 VIEW COMPARISON:  02/25/2018 FINDINGS: Endotracheal tube in good position. Underlying COPD. Right lower lobe airspace disease and small right effusion unchanged. Partial clearing of left lower lobe airspace disease. Small left effusion unchanged. IMPRESSION: Endotracheal tube in good position. Right lower lobe airspace disease and small right effusion unchanged Improved aeration left lung base. Electronically Signed   By: Franchot Gallo M.D.   On: 02/27/2018 07:14   Dg Addison Bailey G Tube Plc W/fl W/rad  Result Date: 02/27/2018 CLINICAL DATA:  Multiple failed attempts of nasogastric tube placement on the floor. Patient presents for orogastric tube placement. EXAM: NASO G TUBE PLACEMENT WITH FL AND WITH RAD FLUOROSCOPY TIME:  Fluoroscopy Time:  0.4 minute Radiation Exposure Index (if provided by the fluoroscopic device): 3.3 mGy Number of Acquired Spot Images: 0 COMPARISON:  None. FINDINGS: Intubated  patient presents for orogastric tube placement. Initially an  orogastric tube was inserted to the left of the endotracheal tube. The tube continue early became coiled in the neck at the level of the epiglottis. Two attempts were made with a Dobbhoff tube with the stiffener and place with no interval change. The tube could not be advanced past the epiglottic region of the neck even after deflating the endotracheal tube balloon. IMPRESSION: 1. Failed orogastric tube placement. Tube could not be advanced past the epiglottic region. Electronically Signed   By: Kathreen Devoid   On: 02/27/2018 11:37    EKG:   Orders placed or performed during the hospital encounter of 02/19/18  . EKG 12-Lead  . EKG 12-Lead  . ED EKG within 10 minutes  . ED EKG within 10 minutes  . EKG 12-Lead  . EKG 12-Lead  . EKG 12-Lead  . EKG 12-Lead    ASSESSMENT AND PLAN:   72 year old female with known history of diastolic CHF on 2L o2, A. fib, COPD, diabetes, GERD, sleep apnea, hypertension, interstitial lung disease, mechanical mitral valve on coumadin, arthritis with recent discharge 2 weeks prior to this admission brought back from rehabilitation secondary to fall and cervical fracture.  1. Acute hypoxic/hypercapnic respiratory failure-patient is currently intubated and sedated. Remains on ventilator. On minimal vent settings. -Heart rate is still elevated. Secondary to acute on chronic diastolic CHF exacerbation. -On steroids, also on Unasyn for any aspiration. -Lasix has been held due to renal insufficiency  2. A. fib with RVR-known history of paroxysmal A. fib. Currently remains on Cardizem drip. -Patient has known history of sick sinus syndrome and is status post pacemaker -Appreciate cardiology input  3. Significant epistaxis-inability to place NG tube or OG tube. Remains on TPN. Bleeding has stopped. Has nasal packing in place -On anticoagulation secondary to mechanical heart valve  4. Fall and cervical C1/C2 fracture-appreciate neurosurgery consult.  -continue cervical  collar. No surgery indicated an outpatient follow-up recommended  5. Mechanical mitral valve-was on Coumadin as outpatient. However due to fall, ongoing bleeding-Coumadin is currently on hold. -High risk for thromboembolic events without that.  6. Known history of interstitial lung disease and vasculitis-follows with rheumatologist as outpatient. Patient was taking Imuran prior to admission.  7. Non-insulin-dependent diabetes mellitus-currently on insulin drip. In the hospital due to hyperglycemia from steroids  8. Acute on chronic anemia-due to significant bleeding. No indication for transfusion as well as hemoglobin greater than 7.  9. Hypernatremia and hyperchloremia-start gentle hydration with D5 half normal saline and monitor.   [name] and high risk for cardiorespiratory arrest. Consider CODE STATUS discussion with family.   STRONGLY RECOMMEND PALLIATIVE CARE CONSULTATION.    All the records are reviewed and case discussed with Care Management/Social Workerr. Management plans discussed with the patient, family and they are in agreement.  CODE STATUS: Full Code  TOTAL TIME TAKING CARE OF THIS PATIENT: 38 minutes.   POSSIBLE D/C IN ? DAYS, DEPENDING ON CLINICAL CONDITION.   Gladstone Lighter M.D on 02/28/2018 at 7:45 AM  Between 7am to 6pm - Pager - [phone]  After 6pm go to [email or web address] - password [email or web address] [facility]  Office  [phone]  CC: Primary care physician; Dion Body, MD

## 2018-02-28 NOTE — Progress Notes (Signed)
Pulmonary / [place] Intubated,sedated cervical collar in place Nasal packing per ENT. Patient has had continuous oozing and bleeding from the back of her throat most likely from her sinuses. Unable to perform oral gastric tube. Started on TPN. Sodium has improved from 158-150 overnight. Pending transfer however [facility] is on diversion  Vitals:   02/28/18 0500 02/28/18 0600 02/28/18 0700 02/28/18 0741  BP: 120/62 (!) 119/59 127/74   Pulse: (!) 106 (!) 111 (!) 110   Resp: 16 20 (!) 23   Temp:    99.8 F (37.7 C)  TempSrc:    Axillary  SpO2: 99% 100% 99%   Weight:      Height:        ROS unobtainable due to critical illness   HEENT: intubated, on vent, facial eccymosis Left norstil balloon in place Neck: Cervical collar in place Chest: Bilateral crackles, no wheezes Cardiac: Regular, mechanical first heart sound, no M Abdomen: Soft, bowel sounds present, nontender Extremities: Warm, no edema Neuro: Cranial nerves intact, no focal deficits, diffusely weak   BMP Latest Ref Rng & Units 02/28/2018 02/27/2018 02/27/2018  Glucose 65 - 99 mg/dL 298(H) 254(H) 241(H)  BUN 6 - 20 mg/dL 38(H) 42(H) 47(H)  Creatinine 0.44 - 1.00 mg/dL 0.86 0.92 0.98  Sodium 135 - 145 mmol/L 150(H) 154(H) 158(H)  Potassium 3.5 - 5.1 mmol/L 4.5 4.3 4.3  Chloride 101 - 111 mmol/L 113(H) 113(H) 116(H)  CO2 22 - 32 mmol/L 33(H) 33(H) 34(H)  Calcium 8.9 - 10.3 mg/dL 7.4(L) 7.7(L) 7.7(L)    CBC Latest Ref Rng & Units 02/28/2018 02/27/2018 02/27/2018  WBC 3.6 - 11.0 K/uL 12.3(H) 12.0(H) 8.9  Hemoglobin 12.0 - 16.0 g/dL 7.6(L) 8.2(L) 6.7(L)  Hematocrit 35.0 - 47.0 % 25.5(L) 27.1(L) 23.1(L)  Platelets 150 - 440 K/uL 176 220 219    CXR: NSC diffuse bilateral symmetric interstitial prominence  72 yo obese white female with end stage COPD with acute neck fracture nons-urgical now with severe and progressive resp failure from pulm edema and afib with RVR with h/o Mech valve with acute and severe  COPD exacerbation   1.Respiratory Failure -continue Full MV support -continue Bronchodilator Therapy -Wean Fio2 and PEEP as tolerated  2.neck fracture Acute nondisplaced cervical spine fracture after fall Follow up neuro surgery recs Patient has been accepted to [facility].  No beds are available at this time pending transfer  3.Chronic interstitial lung disease IV steroids  4.History of mechanical mitral valve replacement H/o afib on cardizem infusion HOLD AC for now Follow up cardiology recs  5.History of ANCA positive vasculitis  6.- intubated and sedated - minimal sedation to achieve a RASS goal: -1  7.severe epistaxis from NG insertion Appreciate  ENT recs Still oozing from the back of her throat. Nasally packed  8. Anemia with hemoglobin of 7.6. Transfused yesterday  9. Hypernatremia. Improved to 150 will stop D5W   Critical Care Time devoted to patient care services described in this note is 35 minutes.   Overall, patient is critically ill, prognosis is guarded.  Patient with Multiorgan failure and at high risk for cardiac arrest and death.    Plan to transfer to [facility], DO

## 2018-02-28 NOTE — Progress Notes (Addendum)
Pulmonary/critical care  I spoke with Mrs. Wignall son who related to me that she has a bleeding complication associated with amiodarone.  She has had a history of pulmonary alveolar hemorrhage, P anca positive and PAN.  Amiodarone has been stopped and started back on Cardizem will use Lopressor in addition if remains uncontrolled will consult cardiology  Hermelinda Dellen, DO  Follow-up exam at 3:30 Lungs are clear No change in respiratory status Chest x-ray performed reveals no significant change in airspace Family notified  Hermelinda Dellen, D.O.

## 2018-02-28 NOTE — Progress Notes (Signed)
Notified Dr. Jefferson Fuel that patient's HR elevates to as high as 150's Afib- Cardizem drip is at a rate of 15mg /HR.  Dr.  Jefferson Fuel ordered Amidarone bolus and drip- made him aware of the patient's allergy to Amiodarone- he stated to proceed with Amiodarone drip and wean patient of cardizem drip.

## 2018-02-28 NOTE — Progress Notes (Signed)
[place] NOTE   Pharmacy Consult for TPN management   Patient Measurements: Height: 5\' 9"  (175.3 cm) Weight: 216 lb 11.4 oz (98.3 kg) IBW/kg (Calculated) : 66.2 TPN AdjBW (KG): 73.9 Body mass index is 32 kg/m.  Assessment:  Pharmacy consulted for TPN management for 72 yo female ICU patient requiring mechanical ventilation. Patient failed multiple attempts for tube placement and is to be started on TPN. Patient is on day 7 without adequate nutrition. Patient is currently hypernatremic and is receiving D5 at 176mL/hr.    Current Nutrition:   Plan:  Patient on Clinimix *NO Electrolytes* 5/15 at 42mL/hr and will advance today to goal of 47mL/hr. Will hold lipids x 7 days.  Will add MVI and trace elements to TPN. Thiamine 100mg  x 3 days and folic acid.  Hx DM with increasing Blood sugars: 225, 250, 258, 298, 256 SSI Q4hr.  SSI amt given in last 24 hr:  44 units (note patient on steroids and dextrose 5% IVF and dextrose d/ced this am).  Will add regular insulin to TPN as BG elevated before we go up on rate today. Will add 25 units insulin to TPN bag.  Patient to receive 25 units/1560 ml of TPN which ~ 1 unit/hour >IF steroids are decreased or d/c then will need to adjust> Will monitor labs per policy.   Pharmacy will continue to monitor and adjust per consult.   Stormi Vandevelde A 02/28/2018,9:23 AM

## 2018-03-01 LAB — GLUCOSE, CAPILLARY
GLUCOSE-CAPILLARY: 206 mg/dL — AB (ref 65–99)
GLUCOSE-CAPILLARY: 236 mg/dL — AB (ref 65–99)
GLUCOSE-CAPILLARY: 236 mg/dL — AB (ref 65–99)
GLUCOSE-CAPILLARY: 237 mg/dL — AB (ref 65–99)
Glucose-Capillary: 206 mg/dL — ABNORMAL HIGH (ref 65–99)
Glucose-Capillary: 239 mg/dL — ABNORMAL HIGH (ref 65–99)

## 2018-03-01 LAB — BASIC METABOLIC PANEL
Anion gap: 5 (ref 5–15)
BUN: 33 mg/dL — ABNORMAL HIGH (ref 6–20)
CALCIUM: 7.7 mg/dL — AB (ref 8.9–10.3)
CO2: 32 mmol/L (ref 22–32)
CREATININE: 0.63 mg/dL (ref 0.44–1.00)
Chloride: 116 mmol/L — ABNORMAL HIGH (ref 101–111)
GFR calc non Af Amer: >60 mL/min (ref >60)
Glucose, Bld: 267 mg/dL — ABNORMAL HIGH (ref 65–99)
Potassium: 4.2 mmol/L (ref 3.5–5.1)
Sodium: 153 mmol/L — ABNORMAL HIGH (ref 135–145)

## 2018-03-01 LAB — CBC
HCT: 23.9 % — ABNORMAL LOW (ref 35.0–47.0)
Hemoglobin: 7.1 g/dL — ABNORMAL LOW (ref 12.0–16.0)
MCH: 26 pg (ref 26.0–34.0)
MCHC: 29.9 g/dL — ABNORMAL LOW (ref 32.0–36.0)
MCV: 86.9 fL (ref 80.0–100.0)
PLATELETS: 145 10*3/uL — AB (ref 150–440)
RBC: 2.75 MIL/uL — AB (ref 3.80–5.20)
RDW: 20 % — AB (ref 11.5–14.5)
WBC: 11.7 10*3/uL — AB (ref 3.6–11.0)

## 2018-03-01 LAB — MAGNESIUM: MAGNESIUM: 2.7 mg/dL — AB (ref 1.7–2.4)

## 2018-03-01 LAB — PHOSPHORUS: PHOSPHORUS: 2.4 mg/dL — AB (ref 2.5–4.6)

## 2018-03-01 MED ORDER — POTASSIUM PHOSPHATES 15 MMOLE/5ML IV SOLN
10.0000 mmol | Freq: Once | INTRAVENOUS | Status: AC
  Administered 2018-03-01: 10 mmol via INTRAVENOUS
  Filled 2018-03-01: qty 3.33

## 2018-03-01 MED ORDER — TRACE MINERALS CR-CU-MN-SE-ZN 10-1000-500-60 MCG/ML IV SOLN
INTRAVENOUS | Status: DC
  Filled 2018-03-01: qty 1560

## 2018-03-01 MED ORDER — INSULIN GLARGINE 100 UNIT/ML ~~LOC~~ SOLN
10.0000 [IU] | Freq: Once | SUBCUTANEOUS | Status: AC
  Administered 2018-03-01: 10 [IU] via SUBCUTANEOUS
  Filled 2018-03-01: qty 0.1

## 2018-03-01 NOTE — Progress Notes (Signed)
[place] NOTE   Pharmacy Consult for TPN management   Patient Measurements: Height: 5\' 9"  (175.3 cm) Weight: 218 lb 7.6 oz (99.1 kg) IBW/kg (Calculated) : 66.2 TPN AdjBW (KG): 73.9 Body mass index is 32.26 kg/m.  Assessment:  Pharmacy consulted for TPN management for 72 yo female ICU patient requiring mechanical ventilation. Patient failed multiple attempts for tube placement and is to be started on TPN. Patient is on day 7 without adequate nutrition.    Current Nutrition: NPO  Plan:  Patient on Clinimix *NO Electrolytes* 5/15 at goal of 46mL/hr. Will hold lipids x 7 days.  Will add MVI and trace elements to TPN. Thiamine 100mg  x 3 days and folic acid.  Phos= 2.4. Will order Potassium Phosphate 10 mmol IV x 1.  Hx DM with increasing Blood sugars: 206, 206, 239, 267,236 SSI Q4hr.  SSI amt given in last 24 hr:  46 units (note patient on steroids).  Will add regular insulin to TPN as BG elevated. Will continue with 25 units insulin to TPN bag.  Patient to receive 25 units/1560 ml of TPN which ~ 1 unit/hour. Will give one time dose of Lantus 10 units. >IF steroids are decreased or d/c then will need to adjust>  Will monitor labs per policy.  Pharmacy will continue to monitor and adjust per consult.   Betty [name] A 03/01/2018,7:36 AM

## 2018-03-01 NOTE — Discharge Summary (Signed)
Discharge summary  Date of admission is 02/19/2018  Date of discharge is 03/01/2018  Hospital course:  Betty [name]  is a 72 y.o. female with a known history of chronic diastolic heart failure, vasculitis and COPD on 2 L of oxygen who presented from nursing home after a fall. Patient reports that she got off balance and fell on her head. CT scan of the neck shows C1/C2 fracture. Neurosurgery was consulted. Neurosurgery is recommending hospitalization with a neck brace. Patient has had a complicated cardiac history to include age of fibrillation with rapid ventricular response, valvular heart disease. Hospital course has been complicated by nasal bleeding requiring packing with Rhino Rocket per ENT. Presently still present. Plan was to try to remove tomorrow if bleeding had stabilized. No bleeding over last 24 hours and hemoglobin has remained in the low 70s.Patient developed respiratory failure has a past medical history remarkable for COPD, was intubated, placed empirically on antibiotics to include Unasyn, recommend finishing a 7-8 day course. Right lower lobe revealed some platelike atelectasis and stranding along with increased secretions. She does have a past medical history remarkable for pulmonary vasculitis with pulmonary hemorrhage, interstitial lung disease workup has been at [facility]. Her atrial fibrillation has been difficult to manage. She has been on Cardizem infusion with as needed Lopressor dosing. She is not to go on amiodarone per family. Apparently she has had a history of significant bleeding while on amiodarone in the past.  Over the last 24 hours, patient has Done well. Patient was awake, alert and in no acute distress this morning with no clear evidence of bleeding from nasal packing   VENTILATOR SETTINGS: Vent Mode: PRVC FiO2 (%):  [40 %] 40 % Set Rate:  [15 bmp-24 bmp] 24 bmp Vt Set:  [500 mL] 500 mL PEEP:  [5 cmH20] 5 cmH20  INTAKE / OUTPUT:  Intake/Output  Summary (Last 24 hours) at 03/01/2018 1026 Last data filed at 03/01/2018 0905    Gross per 24 hour  Intake 2412.57 ml  Output 1750 ml  Net 662.57 ml    Name: Betty [name] MRN:   [id number] DOB:   Jun 09, 1946         ADMISSION DATE:  02/19/2018  SUBJECTIVE:   Pt currently on the ventilator, can not provide history or review of systems.  No difficulties noted overnight.  Patient is awake and alert this morning.  Communicating and understands question.  It appears the bleeding from the nose has stopped.  Respiratory status has remained stable  VITAL SIGNS: Temp:  [98.7 F (37.1 C)-101.2 F (38.4 C)] 98.8 F (37.1 C) (03/10 0800) Pulse Rate:  [92-121] 95 (03/10 0800) Resp:  [13-27] 24 (03/10 0900) BP: (94-141)/(45-114) 115/83 (03/10 0900) SpO2:  [92 %-100 %] 100 % (03/10 0900) FiO2 (%):  [40 %] 40 % (03/10 0734) Weight:  [99.1 kg (218 lb 7.6 oz)] 99.1 kg (218 lb 7.6 oz) (03/10 0430)  PHYSICAL EXAMINATION: Physical Examination:   VS: BP 115/83   Pulse 95   Temp 98.8 F (37.1 C)   Resp (!) 24   Ht 5\' 9"  (1.753 m)   Wt 99.1 kg (218 lb 7.6 oz)   SpO2 100%   BMI 32.26 kg/m   General Appearance: No distress  Neuro:without focal findings, mental status normal. HEENT: Nasal packing is in place per ENT.  No obvious bleeding at this time Pulmonary: Improved aeration appreciated in the right lower lung zone Cardiovascular patient with atrial fibrillation, irregularly irregular rhythm, ventricular response  appears a little bit more controlled this morning when I listen to her.  She is presently in the 90s on Cardizem infusion Abdomen: Benign, Soft, non-tender. Extremities: normal, no cyanosis, clubbing.  Patient remains in cervical collar.   Respiratory failure on mechanical ventilation.  Patient has complicated past pulmonary history to include COPD, sleep apnea, interstitial lung disease, history of vasculitis in the past with respiratory failure most likely contributing  to include C1/2 fracture with possible phrenic nerve weakness, being treated for pneumonia with secretions and mildly obscured right hemidiaphragm.  Also possible aspiration of blood during nasal bleeding.  She appears improved today.  No clear evidence of bleeding noted.  Waiting for ENT to remove nasal packing before considering breathing trials.  Concern for having to reintubate patient with nasal bleeding and C-spine fracture.  Atrial fibrillation.  Ventricular response is borderline.  Is presently on Cardizem. Had remote bleeding reaction to amiodarone per family, we will not use.  Patient does have a history of sick sinus syndrome, status post pacemaker with significant valvular heart disease  Patient with mechanical mitral valve on Coumadin therapy.  Unfortunately unable to anticoagulate right now, is a high risk for recurrent thromboembolic event  Diabetes.  Monitor glucose on scale coverage  Anemia.  Hemoglobin was 7.1 we will hold on additional transfusion at this time and follow closely  Sodium has increased to 153.  Will adjust and TPN.  May need to start D5W if increases.  No evidence of diabetes insipidus with urinary osmolality study  Mild leukocytosis.  Presently finishing a course of Unasyn  Patient has been accepted at [facility]  Accepting physician is Turpin Hills, DO

## 2018-03-01 NOTE — Progress Notes (Signed)
[place] [facility] [place] NAME: Betty [name]    MR#:  [id number]  DATE OF BIRTH:  September 24, 1946  SUBJECTIVE:  CHIEF COMPLAINT:   Chief Complaint  Patient presents with  . Fall  . Head Injury   -Patient was recently discharged from the hospital to rehabilitation, comes back after a fall and C1/C2 neck fracture. Nonsurgical management was recommended. Patient went into acute respiratory distress and has been on the ventilator since 02/25/2018. -remains on vent, no significant changes. Heart rate is elevated. On Cardizem drip  REVIEW OF SYSTEMS:  Review of Systems  Unable to perform ROS: Critical illness    DRUG ALLERGIES:   Allergies  Allergen Reactions  . Flecainide Shortness Of Breath and Other (See Comments)    Reaction: dizziness   . Amiodarone Other (See Comments)    Pt states that this medication causes lung bleeding.      VITALS:  Blood pressure (!) 112/48, pulse 95, temperature 99.7 F (37.6 C), temperature source Oral, resp. rate (!) 24, height 5\' 9"  (1.753 m), weight 99.1 kg (218 lb 7.6 oz), SpO2 94 %.  PHYSICAL EXAMINATION:  Physical Exam  GENERAL:  72 y.o.-year-old patient lying in the bed, critically ill appearing EYES: Pupils equal, round, reactive to light and accommodation. No scleral icterus. Extraocular muscles intact.  HEENT: Head  normocephalic. Multiple facial bruising noted. Old blood noted around both nares, there is packing in the left nare.  NECK:  Supple, in a brace. No thyroid enlargement, no tenderness.  LUNGS: Normal breath sounds bilaterally, no wheezing, rales,rhonchi or crepitation. No use of accessory muscles of respiration. Decreased at the bases CARDIOVASCULAR: S1, S2 normal. No  rubs, or gallops. Loud 4/6 systolic murmur noted ABDOMEN: Soft, nontender, nondistended. Bowel sounds present. No organomegaly or mass.  EXTREMITIES: No pedal edema, cyanosis, or clubbing.  NEUROLOGIC: intubated and  sedated-opens eyes and following some simple commands today when sedation was decreased  PSYCHIATRIC: The patient is sedated.  SKIN: No obvious rash, lesion, or ulcer. Multiple bruises noted all over   LABORATORY PANEL:   CBC Recent Labs  Lab 03/01/18 0545  WBC 11.7*  HGB 7.1*  HCT 23.9*  PLT 145*   ------------------------------------------------------------------------------------------------------------------  Chemistries  Recent Labs  Lab 02/28/18 0436  03/01/18 0545  NA 150*   < > 153*  K 4.5   < > 4.2  CL 113*   < > 116*  CO2 33*   < > 32  GLUCOSE 298*   < > 267*  BUN 38*   < > 33*  CREATININE 0.86   < > 0.63  CALCIUM 7.4*   < > 7.7*  MG 2.6*  --  2.7*  AST 50*  --   --   ALT 51  --   --   ALKPHOS 69  --   --   BILITOT 1.3*  --   --    < > = values in this interval not displayed.   ------------------------------------------------------------------------------------------------------------------  Cardiac Enzymes No results for input(s): TROPONINI in the last 168 hours. ------------------------------------------------------------------------------------------------------------------  RADIOLOGY:  Dg Chest Port 1 View  Result Date: 02/28/2018 CLINICAL DATA:  Fall while on blood thinners, intubated. history of pulmonary alveolar hemorrhage EXAM: PORTABLE CHEST 1 VIEW COMPARISON:  02/27/2018 FINDINGS: Changes from cardiac surgery and valve replacement are stable. No mediastinal or hilar masses. There are irregularly thickened interstitial markings with a lower lung zone predominance. No new lung abnormalities. Small effusions.  No  pneumothorax. Endotracheal tube tip projects 3.2 cm above the Carina, stable. Stable left anterior chest wall sequential pacemaker. IMPRESSION: 1. No significant change from the most recent prior study. 2. Irregularly thickened interstitial markings with a basilar predominance. This may all be chronic or reflect a combination of chronic  interstitial thickening and interstitial edema. 3. Small effusions. Electronically Signed   By: Lajean Manes M.D.   On: 02/28/2018 16:10   Dg Addison Bailey G Tube Plc W/fl W/rad  Result Date: 02/27/2018 CLINICAL DATA:  Multiple failed attempts of nasogastric tube placement on the floor. Patient presents for orogastric tube placement. EXAM: NASO G TUBE PLACEMENT WITH FL AND WITH RAD FLUOROSCOPY TIME:  Fluoroscopy Time:  0.4 minute Radiation Exposure Index (if provided by the fluoroscopic device): 3.3 mGy Number of Acquired Spot Images: 0 COMPARISON:  None. FINDINGS: Intubated patient presents for orogastric tube placement. Initially an orogastric tube was inserted to the left of the endotracheal tube. The tube continue early became coiled in the neck at the level of the epiglottis. Two attempts were made with a Dobbhoff tube with the stiffener and place with no interval change. The tube could not be advanced past the epiglottic region of the neck even after deflating the endotracheal tube balloon. IMPRESSION: 1. Failed orogastric tube placement. Tube could not be advanced past the epiglottic region. Electronically Signed   By: Kathreen Devoid   On: 02/27/2018 11:37    EKG:   Orders placed or performed during the hospital encounter of 02/19/18  . EKG 12-Lead  . EKG 12-Lead  . ED EKG within 10 minutes  . ED EKG within 10 minutes  . EKG 12-Lead  . EKG 12-Lead  . EKG 12-Lead  . EKG 12-Lead    ASSESSMENT AND PLAN:   72 year old female with known history of diastolic CHF on 2L o2, A. fib, COPD, diabetes, GERD, sleep apnea, hypertension, interstitial lung disease, mechanical mitral valve on coumadin, arthritis with recent discharge 2 weeks prior to this admission brought back from rehabilitation secondary to fall and cervical fracture.  1. Acute hypoxic/hypercapnic respiratory failure-patient is currently intubated and sedated. Remains on ventilator. On minimal vent settings. -Heart rate is still elevated.  Secondary to acute on chronic diastolic CHF exacerbation. -On steroids, also on Unasyn for any aspiration. -Lasix has been held due to renal insufficiency  2. A. fib with RVR-known history of paroxysmal A. fib. Currently remains on Cardizem drip. -Patient has known history of sick sinus syndrome and is status post pacemaker -Appreciate cardiology input  3. Significant epistaxis-inability to place NG tube or OG tube. Remains on TPN. Bleeding has stopped. Has nasal packing in place -On anticoagulation secondary to mechanical heart valve  4. Fall and cervical C1/C2 fracture-appreciate neurosurgery consult.  -continue cervical collar. No surgery indicated an outpatient follow-up recommended  5. Mechanical mitral valve-was on Coumadin as outpatient. However due to fall, ongoing bleeding-Coumadin is currently on hold. -High risk for thromboembolic events without that. -Last INR was 1.24  6. Known history of interstitial lung disease and vasculitis-follows with rheumatologist as outpatient. Patient was taking Imuran prior to admission.  7. Non-insulin-dependent diabetes mellitus-currently on insulin drip. In the hospital due to hyperglycemia from steroids  8. Acute on chronic anemia-due to significant bleeding. Had epistaxis and also bleeding in OG tube. No indication for transfusion as well as hemoglobin greater than 7.  9. Hypernatremia and hyperchloremia-start gentle hydration with D5 half normal saline and monitor.   [name] and high risk for  cardiorespiratory arrest. Consider CODE STATUS discussion with family.   STRONGLY RECOMMEND PALLIATIVE CARE CONSULTATION.    All the records are reviewed and case discussed with Care Management/Social Workerr. Management plans discussed with the patient, family and they are in agreement.  CODE STATUS: Full Code  TOTAL TIME TAKING CARE OF THIS PATIENT: 38 minutes.   POSSIBLE D/C IN ? DAYS, DEPENDING ON CLINICAL  CONDITION.   Gladstone Lighter M.D on 03/01/2018 at 8:54 AM  Between 7am to 6pm - Pager - [phone]  After 6pm go to [email or web address] - password [email or web address] [facility]  Office  [phone]  CC: Primary care physician; Dion Body, MD

## 2018-03-01 NOTE — Progress Notes (Addendum)
[facility]  called with bed assignment for patient. Report called to [place] RN. Dr. Jefferson Fuel requested Life flight. Life flight called at 1530.Radiology informed that [facility] would request sharing of x-rays through power share.. Dr. Rosealee Albee is accepting Ms. Roxy Manns. Life flight arrived at 1600 to transport patient. Life flight left with patient at 1655. Family on their way to [facility].

## 2018-03-01 NOTE — Progress Notes (Signed)
[facility] Progess Note    SYNOPSIS   72 yo obese white female with end stage COPD with acute neck fracture nons-urgical now with severe and progressive resp failure from pulm edema and afib with RVR with h/o Mech valve with acute and severe COPD exacerbation  ASSESSMENT/PLAN   Respiratory failure on mechanical ventilation.  Patient has complicated past pulmonary history to include COPD, sleep apnea, interstitial lung disease, history of vasculitis in the past with respiratory failure most likely contributing to include C1/2 fracture with possible phrenic nerve weakness, being treated for pneumonia with secretions and mildly obscured right hemidiaphragm.  Also possible aspiration of blood during nasal bleeding.  She appears improved today.  No clear evidence of bleeding noted.  Waiting for ENT to remove nasal packing before considering breathing trials.  Concern for having to reintubate patient with nasal bleeding and C-spine fracture.  Atrial fibrillation.  Ventricular response is borderline.  Is presently on Cardizem.  Had remote bleeding reaction to amiodarone per family, we will not use.  Patient does have a history of sick sinus syndrome, status post pacemaker with significant valvular heart disease  Patient with mechanical mitral valve on Coumadin therapy.  Unfortunately unable to anticoagulate right now, is a high risk for recurrent thromboembolic event  Diabetes.  Monitor glucose on scale coverage  Anemia.  Hemoglobin was 7.1 we will hold on additional transfusion at this time and follow closely  Sodium has increased to 153.  Will adjust and TPN.  May need to start D5W if increases.  No evidence of diabetes insipidus with urinary osmolality study  Mild leukocytosis.  Presently finishing a course of Unasyn  Patient has been accepted at [facility] however no bed is available at this time.    Critical Care Time 35 minutes  VENTILATOR  SETTINGS: Vent Mode: PRVC FiO2 (%):  [40 %] 40 % Set Rate:  [15 bmp-24 bmp] 24 bmp Vt Set:  [500 mL] 500 mL PEEP:  [5 cmH20] 5 cmH20  INTAKE / OUTPUT:  Intake/Output Summary (Last 24 hours) at 03/01/2018 1026 Last data filed at 03/01/2018 0905 Gross per 24 hour  Intake 2412.57 ml  Output 1750 ml  Net 662.57 ml    Name: Betty [name] MRN: [id number] DOB: [birth year]    ADMISSION DATE:  02/19/2018  SUBJECTIVE:   Pt currently on the ventilator, can not provide history or review of systems.  No difficulties noted overnight.  Patient is awake and alert this morning.  Communicating and understands question.  It appears the bleeding from the nose has stopped.  Respiratory status has remained stable  VITAL SIGNS: Temp:  [98.7 F (37.1 C)-101.2 F (38.4 C)] 98.8 F (37.1 C) (03/10 0800) Pulse Rate:  [92-121] 95 (03/10 0800) Resp:  [13-27] 24 (03/10 0900) BP: (94-141)/(45-114) 115/83 (03/10 0900) SpO2:  [92 %-100 %] 100 % (03/10 0900) FiO2 (%):  [40 %] 40 % (03/10 0734) Weight:  [99.1 kg (218 lb 7.6 oz)] 99.1 kg (218 lb 7.6 oz) (03/10 0430)  PHYSICAL EXAMINATION: Physical Examination:   VS: BP 115/83   Pulse 95   Temp 98.8 F (37.1 C)   Resp (!) 24   Ht 5\' 9"  (1.753 m)   Wt 99.1 kg (218 lb 7.6 oz)   SpO2 100%   BMI 32.26 kg/m   General Appearance: No distress  Neuro:without focal findings, mental status normal. HEENT: Nasal packing is in place per ENT.  No obvious bleeding at this time Pulmonary:  Improved aeration appreciated in the right lower lung zone Cardiovascular patient with atrial fibrillation, irregularly irregular rhythm, ventricular response appears a little bit more controlled this morning when I listen to her.  She is presently in the 90s on Cardizem infusion Abdomen: Benign, Soft, non-tender. Extremities: normal, no cyanosis, clubbing.  Patient remains in cervical collar.    LABORATORY PANEL:   CBC Recent Labs  Lab 03/01/18 0545  WBC 11.7*  HGB  7.1*  HCT 23.9*  PLT 145*    Chemistries  Recent Labs  Lab 02/28/18 0436  03/01/18 0545  NA 150*   < > 153*  K 4.5   < > 4.2  CL 113*   < > 116*  CO2 33*   < > 32  GLUCOSE 298*   < > 267*  BUN 38*   < > 33*  CREATININE 0.86   < > 0.63  CALCIUM 7.4*   < > 7.7*  MG 2.6*  --  2.7*  PHOS 3.1  --  2.4*  AST 50*  --   --   ALT 51  --   --   ALKPHOS 69  --   --   BILITOT 1.3*  --   --    < > = values in this interval not displayed.    Recent Labs  Lab 02/28/18 1601 02/28/18 2011 03/01/18 0044 03/01/18 0048 03/01/18 0422 03/01/18 0724  GLUCAP 232* 218* 206* 206* 239* 236*   Recent Labs  Lab 02/24/18 2014 02/25/18 0118 02/26/18 1154  PHART 7.39 7.42 7.31*  PCO2ART 73* 63* 84*  PO2ART  --  160* 76*   Recent Labs  Lab 02/23/18 0053 02/28/18 0436  AST 321* 50*  ALT 255* 51  ALKPHOS 124 69  BILITOT 2.5* 1.3*  ALBUMIN 3.5 3.1*    Cardiac Enzymes No results for input(s): TROPONINI in the last 168 hours.  RADIOLOGY:  Dg Chest Port 1 View  Result Date: 02/28/2018 CLINICAL DATA:  Fall while on blood thinners, intubated. history of pulmonary alveolar hemorrhage EXAM: PORTABLE CHEST 1 VIEW COMPARISON:  02/27/2018 FINDINGS: Changes from cardiac surgery and valve replacement are stable. No mediastinal or hilar masses. There are irregularly thickened interstitial markings with a lower lung zone predominance. No new lung abnormalities. Small effusions.  No pneumothorax. Endotracheal tube tip projects 3.2 cm above the Carina, stable. Stable left anterior chest wall sequential pacemaker. IMPRESSION: 1. No significant change from the most recent prior study. 2. Irregularly thickened interstitial markings with a basilar predominance. This may all be chronic or reflect a combination of chronic interstitial thickening and interstitial edema. 3. Small effusions. Electronically Signed   By: Lajean Manes M.D.   On: 02/28/2018 16:10   Dg Addison Bailey G Tube Plc W/fl W/rad  Result Date:  02/27/2018 CLINICAL DATA:  Multiple failed attempts of nasogastric tube placement on the floor. Patient presents for orogastric tube placement. EXAM: NASO G TUBE PLACEMENT WITH FL AND WITH RAD FLUOROSCOPY TIME:  Fluoroscopy Time:  0.4 minute Radiation Exposure Index (if provided by the fluoroscopic device): 3.3 mGy Number of Acquired Spot Images: 0 COMPARISON:  None. FINDINGS: Intubated patient presents for orogastric tube placement. Initially an orogastric tube was inserted to the left of the endotracheal tube. The tube continue early became coiled in the neck at the level of the epiglottis. Two attempts were made with a Dobbhoff tube with the stiffener and place with no interval change. The tube could not be advanced past the epiglottic region of the  neck even after deflating the endotracheal tube balloon. IMPRESSION: 1. Failed orogastric tube placement. Tube could not be advanced past the epiglottic region. Electronically Signed   By: Kathreen Devoid   On: 02/27/2018 11:37   Hermelinda Dellen, DO  03/01/2018

## 2018-03-23 ENCOUNTER — Encounter: Payer: Self-pay | Admitting: Urology

## 2018-03-23 ENCOUNTER — Ambulatory Visit: Payer: Medicare Other | Admitting: Urology

## 2018-04-07 ENCOUNTER — Encounter: Payer: Self-pay | Admitting: Internal Medicine

## 2018-04-08 ENCOUNTER — Other Ambulatory Visit (HOSPITAL_COMMUNITY): Payer: Medicare Other | Admitting: Internal Medicine

## 2018-04-08 DIAGNOSIS — I5033 Acute on chronic diastolic (congestive) heart failure: Secondary | ICD-10-CM

## 2018-04-08 DIAGNOSIS — I776 Arteritis, unspecified: Secondary | ICD-10-CM

## 2018-04-08 DIAGNOSIS — J9621 Acute and chronic respiratory failure with hypoxia: Secondary | ICD-10-CM

## 2018-04-08 DIAGNOSIS — I482 Chronic atrial fibrillation, unspecified: Secondary | ICD-10-CM

## 2018-04-08 DIAGNOSIS — K219 Gastro-esophageal reflux disease without esophagitis: Secondary | ICD-10-CM

## 2018-04-08 DIAGNOSIS — I7782 Antineutrophilic cytoplasmic antibody (ANCA) vasculitis: Secondary | ICD-10-CM

## 2018-04-08 NOTE — Progress Notes (Signed)
[facility]  PROGRESS NOTE  PULMONARY SERVICE ROUNDS  Date of Service: 04/08/2018  Betty [name]  DOB: March 13, 1946  Referring physician: Deanne Coffer, MD  HPI: Betty [name] is a 72 y.o. female  being seen for Acute on Chronic Respiratory Failure.  Patient is on T collar doing relatively well her tracheostomy tube will be changed today hopefully.  Her neck is to be cleared by the 21st and hopefully we can decannulate her by the 21st  Review of Systems: Unremarkable other than noted in HPI  Allergies:  Reviewed on the [facility]  Medications: Reviewed  Vitals: Temperature 98.0 pulse 79 respiratory rate 20 blood pressure 110/74 saturations 98%  Ventilator Settings: Aerosolized T collar FiO2 28%  Physical Exam: . General:  calm and comfortable NAD . Eyes: normal lids, irises & conjunctiva . ENT: grossly normal tongue not enlarged . Neck: no masses . Cardiovascular: S1 S2 Normal no rubs no gallop . Respiratory: No rhonchi expansion equal . Abdomen: Soft nondistended . Skin: no rash seen on limited exam . Musculoskeletal:  no rigidity . Psychiatric: unable to assess . Neurologic: no involuntary movements          Lab Data:  Laboratory results have been reviewed  Radiological Data: Chest x-ray shows improving  Assessment/Plan  Patient Active Problem List   Diagnosis Date Noted  . Respiratory failure, acute ([place]) 02/26/2018  . COPD with acute exacerbation ([place]) 02/26/2018  . C2 cervical fracture ([place]) 02/19/2018  . Hematuria 01/27/2018  . Carotid stenosis 12/23/2017  . Varicose veins of both lower extremities with pain 11/17/2017  . Acute on chronic diastolic CHF (congestive heart failure) ([place]) 08/13/2017  . Leg pain 07/14/2017  . Chronic venous insufficiency 07/14/2017  . PAD (peripheral artery disease) ([place]) 07/14/2017  . Postprocedural hemorrhage due to complication of oral surgery 03/27/2017  . Acute blood loss anemia 03/27/2017  . H/O mitral valve  replacement with mechanical valve 03/27/2017  . Atypical atrial flutter ([place]) 01/23/2017  . Long term current use of anticoagulants with INR goal of 2.5-3.5 09/18/2016  . Syncope 06/25/2016  . UTI (lower urinary tract infection) 06/25/2016  . Afib ([place]) 05/10/2016  . Dyspnea 05/10/2016  . Acute diastolic CHF (congestive heart failure) ([place]) 05/10/2016  . Anemia 05/10/2016  . Anemia 05/10/2016  . Other specified abnormal immunological findings in serum 04/24/2016  . Sepsis ([place]) 04/01/2016  . Prolonged Q-T interval on ECG 03/13/2016  . Mitral stenosis 03/13/2016  . Essential hypertension, benign 03/13/2016  . Diastolic congestive heart failure ([place]) 03/13/2016  . Interstitial lung disease ([place]) 12/29/2015  . Chronic LBP 07/26/2015  . Essential (primary) hypertension 07/26/2015  . Idiopathic insomnia 07/26/2015  . Restless leg 07/26/2015  . Obesity (BMI 30-39.9) 01/07/2015  . Abnormal EKG 09/11/2014  . External nasal lesion 02/26/2014  . Abnormal liver function tests 01/28/2014  . Ankle pain, right 01/28/2014  . Arthralgia of ankle or foot 01/28/2014  . Urinary frequency 12/24/2013  . Hemorrhoids 12/24/2013  . Neck pain on right side 11/10/2013  . Vitamin D deficiency 09/09/2013  . Obstructive sleep apnea 05/30/2013  . Urinary incontinence 05/30/2013  . Diabetes ([place]) 05/30/2013  . History of colonic polyps 05/30/2013  . Controlled type 2 diabetes mellitus without complication ([place]) 96/78/9381  . H/O deep venous thrombosis 05/05/2013  . History of pulmonary embolism 05/05/2013  . 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 OSA (obstructive sleep apnea) 05/05/2013  . Depression 04/13/2009  . THYROID NODULE 04/13/2009  . 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  GERD 04/13/2009  . OSTEOARTHRITIS 04/13/2009  . BACK PAIN 04/13/2009  . HEADACHE 04/13/2009  . Gastro-esophageal reflux disease without esophagitis 04/13/2009  . Mild episode of recurrent major depressive  disorder ([place]) 04/13/2009  . 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 ATRIAL FIBRILLATION 03/28/2009  . DIASTOLIC HEART FAILURE, ACUTE 03/28/2009  . VENTRICULAR HYPERTROPHY, LEFT 03/28/2009  . Paroxysmal atrial fibrillation ([place]) 03/28/2009  . Hypertensive cardiomyopathy, with heart failure ([place]) 03/28/2009      1. Acute on chronic respiratory failure with hypoxia she is clinically improved we will now be ready to proceed towards decannulation 2. Anka vasculitis she will be followed up as an outpatient 3. Chronic atrial fibrillation rate is controlled we will continue to follow 4. Chronic acute diastolic heart failure clinically improved 5. GERD stable at this time   I have personally evaluated the patient, evaluated the laboratory and imaging results and formulated the assessment and plan and placed orders as needed. The Patient requires high complexity decision making for assessment and support. I have discussed the patient on rounds with the Respiratory Staff   Betty Gee, MD [facility] Pulmonary Critical Care Medicine

## 2018-04-10 ENCOUNTER — Other Ambulatory Visit (HOSPITAL_COMMUNITY): Payer: Medicare Other | Admitting: Internal Medicine

## 2018-04-10 DIAGNOSIS — J9621 Acute and chronic respiratory failure with hypoxia: Secondary | ICD-10-CM | POA: Diagnosis not present

## 2018-04-10 DIAGNOSIS — G4733 Obstructive sleep apnea (adult) (pediatric): Secondary | ICD-10-CM

## 2018-04-10 DIAGNOSIS — K219 Gastro-esophageal reflux disease without esophagitis: Secondary | ICD-10-CM

## 2018-04-10 DIAGNOSIS — I7782 Antineutrophilic cytoplasmic antibody (ANCA) vasculitis: Secondary | ICD-10-CM

## 2018-04-10 DIAGNOSIS — I776 Arteritis, unspecified: Secondary | ICD-10-CM

## 2018-04-10 DIAGNOSIS — I5033 Acute on chronic diastolic (congestive) heart failure: Secondary | ICD-10-CM

## 2018-04-10 DIAGNOSIS — I482 Chronic atrial fibrillation, unspecified: Secondary | ICD-10-CM

## 2018-04-10 NOTE — Progress Notes (Signed)
[facility]  PROGRESS NOTE  PULMONARY SERVICE ROUNDS  Date of Service: 04/10/2018  Betty [name]  DOB: 27-May-1946  Referring physician: Deanne Coffer, MD  HPI: Betty [name] is a 72 y.o. female  being seen for Acute on Chronic Respiratory Failure.  She is Currently on 2 L oxygen only doing extremely well.  We are proceeding slowly towards decannulation at this point  Review of Systems: Unremarkable other than noted in HPI  Allergies:  Reviewed on the [facility]  Medications: Reviewed  Vitals: Temperature 97.4 pulse 84 respiratory 18 blood pressure 118/76 saturations 94%  Ventilator Settings: Capped on 2 L nasal cannula off of the ventilator  Physical Exam: . General:  calm and comfortable NAD . Eyes: normal lids, irises & conjunctiva . ENT: grossly normal tongue not enlarged . Neck: no masses . Cardiovascular: S1 S2 Normal no rubs no gallop . Respiratory: No rhonchi expansion is equal . Abdomen: Soft nondistended . Skin: no rash seen on limited exam . Musculoskeletal:  no rigidity . Psychiatric: unable to assess . Neurologic: no involuntary movements          Lab Data:  Previous laboratory data reviewed  Radiological Data: Radiological studies have been reviewed  Assessment/Plan  Patient Active Problem List   Diagnosis Date Noted  . Respiratory failure, acute ([place]) 02/26/2018  . COPD with acute exacerbation ([place]) 02/26/2018  . C2 cervical fracture ([place]) 02/19/2018  . Hematuria 01/27/2018  . Carotid stenosis 12/23/2017  . Varicose veins of both lower extremities with pain 11/17/2017  . Acute on chronic diastolic CHF (congestive heart failure) ([place]) 08/13/2017  . Leg pain 07/14/2017  . Chronic venous insufficiency 07/14/2017  . PAD (peripheral artery disease) ([place]) 07/14/2017  . Postprocedural hemorrhage due to complication of oral surgery 03/27/2017  . Acute blood loss anemia 03/27/2017  . H/O mitral valve replacement with mechanical valve  03/27/2017  . Atypical atrial flutter ([place]) 01/23/2017  . Long term current use of anticoagulants with INR goal of 2.5-3.5 09/18/2016  . Syncope 06/25/2016  . UTI (lower urinary tract infection) 06/25/2016  . Afib ([place]) 05/10/2016  . Dyspnea 05/10/2016  . Acute diastolic CHF (congestive heart failure) ([place]) 05/10/2016  . Anemia 05/10/2016  . Anemia 05/10/2016  . Other specified abnormal immunological findings in serum 04/24/2016  . Sepsis ([employer]) 04/01/2016  . Prolonged Q-T interval on ECG 03/13/2016  . Mitral stenosis 03/13/2016  . Essential hypertension, benign 03/13/2016  . Diastolic congestive heart failure ([place]) 03/13/2016  . Interstitial lung disease ([place]) 12/29/2015  . Chronic LBP 07/26/2015  . Essential (primary) hypertension 07/26/2015  . Idiopathic insomnia 07/26/2015  . Restless leg 07/26/2015  . Obesity (BMI 30-39.9) 01/07/2015  . Abnormal EKG 09/11/2014  . External nasal lesion 02/26/2014  . Abnormal liver function tests 01/28/2014  . Ankle pain, right 01/28/2014  . Arthralgia of ankle or foot 01/28/2014  . Urinary frequency 12/24/2013  . Hemorrhoids 12/24/2013  . Neck pain on right side 11/10/2013  . Vitamin D deficiency 09/09/2013  . Obstructive sleep apnea 05/30/2013  . Urinary incontinence 05/30/2013  . Diabetes ([place]) 05/30/2013  . History of colonic polyps 05/30/2013  . Controlled type 2 diabetes mellitus without complication ([place]) 12/45/8099  . H/O deep venous thrombosis 05/05/2013  . History of pulmonary embolism 05/05/2013  . 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 OSA (obstructive sleep apnea) 05/05/2013  . Depression 04/13/2009  . THYROID NODULE 04/13/2009  . 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 GERD 04/13/2009  . OSTEOARTHRITIS 04/13/2009  .  BACK PAIN 04/13/2009  . HEADACHE 04/13/2009  . Gastro-esophageal reflux disease without esophagitis 04/13/2009  . Mild episode of recurrent major depressive disorder ([place]) 04/13/2009  .  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 ATRIAL FIBRILLATION 03/28/2009  . DIASTOLIC HEART FAILURE, ACUTE 03/28/2009  . VENTRICULAR HYPERTROPHY, LEFT 03/28/2009  . Paroxysmal atrial fibrillation ([place]) 03/28/2009  . Hypertensive cardiomyopathy, with heart failure ([place]) 03/28/2009      1. Acute on chronic respiratory failure with hypoxia doing well we will continue with capping trials proceed to decannulation.  She is supposed to have a scan of her neck today 2. ANCA associated vasculitis Stable at this time she will follow-up with rheumatology outpatient 3. Chronic atrial fibrillation rate is controlled at this time we will continue with supportive care 4. Acute on chronic diastolic dysfunction compensated 5. GERD improving 6. Sleep apnea she will need to use positive airway pressure after decannulation be more compliant   I have personally evaluated the patient, evaluated the laboratory and imaging results and formulated the assessment and plan and placed orders as needed. The Patient requires high complexity decision making for assessment and support. I have discussed the patient on rounds with the Respiratory Staff 35 minutes   Allyne Gee, MD [facility] Pulmonary Critical Care Medicine

## 2018-04-12 ENCOUNTER — Other Ambulatory Visit (HOSPITAL_COMMUNITY): Payer: Medicare Other | Admitting: Internal Medicine

## 2018-04-12 DIAGNOSIS — J9621 Acute and chronic respiratory failure with hypoxia: Secondary | ICD-10-CM

## 2018-04-12 DIAGNOSIS — G4733 Obstructive sleep apnea (adult) (pediatric): Secondary | ICD-10-CM

## 2018-04-12 DIAGNOSIS — I776 Arteritis, unspecified: Secondary | ICD-10-CM

## 2018-04-12 DIAGNOSIS — I482 Chronic atrial fibrillation, unspecified: Secondary | ICD-10-CM

## 2018-04-12 DIAGNOSIS — I7782 Antineutrophilic cytoplasmic antibody (ANCA) vasculitis: Secondary | ICD-10-CM

## 2018-04-12 DIAGNOSIS — I5033 Acute on chronic diastolic (congestive) heart failure: Secondary | ICD-10-CM

## 2018-04-12 NOTE — Progress Notes (Signed)
[facility]  PROGRESS NOTE  PULMONARY SERVICE ROUNDS  Date of Service: 04/12/2018  Betty [name]  DOB: Oct 02, 1946  Referring physician: Deanne Coffer, MD  HPI: Betty [name] is a 72 y.o. female  being seen for Acute on Chronic Respiratory Failure.  Patient is doing well at this time.  CT scan was done still showing healing fracture.  The patient scan is supposed to be reviewed by neurosurgery for final clearance at this time she should be able to be D cannulated if no further surgical intervention is going to be necessary.  Review of Systems: Unremarkable other than noted in HPI  Allergies:  Reviewed on the [facility]  Medications: Reviewed  Vitals: Temperature 97.5 pulse 8221 blood pressure 100/64 saturations 100%  Ventilator Settings: Since April 17 has been capped  Physical Exam: . General:  calm and comfortable NAD . Eyes: normal lids, irises & conjunctiva . ENT: grossly normal tongue not enlarged . Neck: no masses . Cardiovascular: S1 S2 Normal no rubs no gallop . Respiratory: no rhonchi . Abdomen: soft non-tender . Skin: no rash seen on limited exam . Musculoskeletal:  no rigidity . Psychiatric: unable to assess . Neurologic: no involuntary movements          Lab Data:  No labs   Radiological Data: reviewed  Assessment/Plan  Patient Active Problem List   Diagnosis Date Noted  . Respiratory failure, acute ([place]) 02/26/2018  . COPD with acute exacerbation ([place]) 02/26/2018  . C2 cervical fracture ([place]) 02/19/2018  . Hematuria 01/27/2018  . Carotid stenosis 12/23/2017  . Varicose veins of both lower extremities with pain 11/17/2017  . Acute on chronic diastolic CHF (congestive heart failure) ([place]) 08/13/2017  . Leg pain 07/14/2017  . Chronic venous insufficiency 07/14/2017  . PAD (peripheral artery disease) ([place]) 07/14/2017  . Postprocedural hemorrhage due to complication of oral surgery 03/27/2017  . Acute blood loss anemia 03/27/2017  . H/O  mitral valve replacement with mechanical valve 03/27/2017  . Atypical atrial flutter ([place]) 01/23/2017  . Long term current use of anticoagulants with INR goal of 2.5-3.5 09/18/2016  . Syncope 06/25/2016  . UTI (lower urinary tract infection) 06/25/2016  . Afib ([place]) 05/10/2016  . Dyspnea 05/10/2016  . Acute diastolic CHF (congestive heart failure) ([place]) 05/10/2016  . Anemia 05/10/2016  . Anemia 05/10/2016  . Other specified abnormal immunological findings in serum 04/24/2016  . Sepsis ([place]) 04/01/2016  . Prolonged Q-T interval on ECG 03/13/2016  . Mitral stenosis 03/13/2016  . Essential hypertension, benign 03/13/2016  . Diastolic congestive heart failure ([place]) 03/13/2016  . Interstitial lung disease ([place]) 12/29/2015  . Chronic LBP 07/26/2015  . Essential (primary) hypertension 07/26/2015  . Idiopathic insomnia 07/26/2015  . Restless leg 07/26/2015  . Obesity (BMI 30-39.9) 01/07/2015  . Abnormal EKG 09/11/2014  . External nasal lesion 02/26/2014  . Abnormal liver function tests 01/28/2014  . Ankle pain, right 01/28/2014  . Arthralgia of ankle or foot 01/28/2014  . Urinary frequency 12/24/2013  . Hemorrhoids 12/24/2013  . Neck pain on right side 11/10/2013  . Vitamin D deficiency 09/09/2013  . Obstructive sleep apnea 05/30/2013  . Urinary incontinence 05/30/2013  . Diabetes ([place]) 05/30/2013  . History of colonic polyps 05/30/2013  . Controlled type 2 diabetes mellitus without complication ([place]) 29/56/2130  . H/O deep venous thrombosis 05/05/2013  . History of pulmonary embolism 05/05/2013  . 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 OSA (obstructive sleep apnea) 05/05/2013  . Depression 04/13/2009  . THYROID NODULE  04/13/2009  . 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 GERD 04/13/2009  . OSTEOARTHRITIS 04/13/2009  . BACK PAIN 04/13/2009  . HEADACHE 04/13/2009  . Gastro-esophageal reflux disease without esophagitis 04/13/2009  . Mild episode of recurrent major  depressive disorder ([place]) 04/13/2009  . 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 ATRIAL FIBRILLATION 03/28/2009  . DIASTOLIC HEART FAILURE, ACUTE 03/28/2009  . VENTRICULAR HYPERTROPHY, LEFT 03/28/2009  . Paroxysmal atrial fibrillation ([place]) 03/28/2009  . Hypertensive cardiomyopathy, with heart failure ([place]) 03/28/2009      1. Acute on chronic respiratory failure with hypoxia at this time patient is doing well with capping trials I think she should be able to be decannulated unless surgical intervention is planned 2. Chronic atrial fibrillation rate is controlled 3. Chronic diastolic heart failure compensated 4. ANCA associated vasculitis stable at this time 5. Sleep apnea we will continue with supportive care   I have personally evaluated the patient, evaluated the laboratory and imaging results and formulated the assessment and plan and placed orders as needed. The Patient requires high complexity decision making for assessment and support. I have discussed the patient on rounds with the Respiratory Staff   Allyne Gee, MD [facility] Pulmonary Critical Care Medicine

## 2018-04-14 ENCOUNTER — Other Ambulatory Visit (HOSPITAL_COMMUNITY): Payer: Medicare Other | Admitting: Internal Medicine

## 2018-04-14 DIAGNOSIS — I5033 Acute on chronic diastolic (congestive) heart failure: Secondary | ICD-10-CM

## 2018-04-14 DIAGNOSIS — I776 Arteritis, unspecified: Secondary | ICD-10-CM

## 2018-04-14 DIAGNOSIS — I482 Chronic atrial fibrillation, unspecified: Secondary | ICD-10-CM

## 2018-04-14 DIAGNOSIS — I7782 Antineutrophilic cytoplasmic antibody (ANCA) vasculitis: Secondary | ICD-10-CM

## 2018-04-14 DIAGNOSIS — J9621 Acute and chronic respiratory failure with hypoxia: Secondary | ICD-10-CM | POA: Diagnosis not present

## 2018-04-14 NOTE — Progress Notes (Signed)
[facility]  PROGRESS NOTE  PULMONARY SERVICE ROUNDS  Date of Service: 04/14/2018  Betty [name]  DOB: 01-17-1946  Referring physician: Deanne Coffer, MD  HPI: Betty [name] is a 72 y.o. female  being seen for Acute on Chronic Respiratory Failure.  She is Comfortable with no new issues overnight is clear to be cannulated  Review of Systems: Unremarkable other than noted in HPI  Allergies:  Reviewed on the [facility]  Medications: Reviewed  Vitals: Temperature 97.3 pulse 86 respiratory rate 18 blood pressure 132/64 saturations 100%  Ventilator Settings: capped  Physical Exam: . General:  calm and comfortable NAD . Eyes: normal lids, irises & conjunctiva . ENT: grossly normal tongue not enlarged . Neck: no masses . Cardiovascular: S1 S2 Normal no rubs no gallop . Respiratory: no rhonchi . Abdomen: soft non-tender . Skin: no rash seen on limited exam . Musculoskeletal:  no rigidity . Psychiatric: unable to assess . Neurologic: no involuntary movements          Lab Data:  noted  Radiological Data: noted  Assessment/Plan  Patient Active Problem List   Diagnosis Date Noted  . Respiratory failure, acute ([place]) 02/26/2018  . COPD with acute exacerbation ([place]) 02/26/2018  . C2 cervical fracture ([place]) 02/19/2018  . Hematuria 01/27/2018  . Carotid stenosis 12/23/2017  . Varicose veins of both lower extremities with pain 11/17/2017  . Acute on chronic diastolic CHF (congestive heart failure) ([place]) 08/13/2017  . Leg pain 07/14/2017  . Chronic venous insufficiency 07/14/2017  . PAD (peripheral artery disease) ([place]) 07/14/2017  . Postprocedural hemorrhage due to complication of oral surgery 03/27/2017  . Acute blood loss anemia 03/27/2017  . H/O mitral valve replacement with mechanical valve 03/27/2017  . Atypical atrial flutter ([place]) 01/23/2017  . Long term current use of anticoagulants with INR goal of 2.5-3.5 09/18/2016  . Syncope 06/25/2016  . UTI (lower  urinary tract infection) 06/25/2016  . Afib ([place]) 05/10/2016  . Dyspnea 05/10/2016  . Acute diastolic CHF (congestive heart failure) ([place]) 05/10/2016  . Anemia 05/10/2016  . Anemia 05/10/2016  . Other specified abnormal immunological findings in serum 04/24/2016  . Sepsis ([place]) 04/01/2016  . Prolonged Q-T interval on ECG 03/13/2016  . Mitral stenosis 03/13/2016  . Essential hypertension, benign 03/13/2016  . Diastolic congestive heart failure ([place]) 03/13/2016  . Interstitial lung disease ([place]) 12/29/2015  . Chronic LBP 07/26/2015  . Essential (primary) hypertension 07/26/2015  . Idiopathic insomnia 07/26/2015  . Restless leg 07/26/2015  . Obesity (BMI 30-39.9) 01/07/2015  . Abnormal EKG 09/11/2014  . External nasal lesion 02/26/2014  . Abnormal liver function tests 01/28/2014  . Ankle pain, right 01/28/2014  . Arthralgia of ankle or foot 01/28/2014  . Urinary frequency 12/24/2013  . Hemorrhoids 12/24/2013  . Neck pain on right side 11/10/2013  . Vitamin D deficiency 09/09/2013  . Obstructive sleep apnea 05/30/2013  . Urinary incontinence 05/30/2013  . Diabetes ([place]) 05/30/2013  . History of colonic polyps 05/30/2013  . Controlled type 2 diabetes mellitus without complication ([place]) 93/79/0240  . H/O deep venous thrombosis 05/05/2013  . History of pulmonary embolism 05/05/2013  . 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 OSA (obstructive sleep apnea) 05/05/2013  . Depression 04/13/2009  . THYROID NODULE 04/13/2009  . 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 GERD 04/13/2009  . OSTEOARTHRITIS 04/13/2009  . BACK PAIN 04/13/2009  . HEADACHE 04/13/2009  . Gastro-esophageal reflux disease without esophagitis 04/13/2009  . Mild episode of recurrent major depressive disorder ([place])  04/13/2009  . 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 ATRIAL FIBRILLATION 03/28/2009  . DIASTOLIC HEART FAILURE, ACUTE 03/28/2009  . VENTRICULAR HYPERTROPHY,  LEFT 03/28/2009  . Paroxysmal atrial fibrillation ([place]) 03/28/2009  . Hypertensive cardiomyopathy, with heart failure ([place]) 03/28/2009      1. Acute on chronic respiratory failure with hypoxia at this time patient was Very well now for more than 24 hours.  We will go ahead and proceed with decannulation will continue with supportive care monitor afterwards how she does. 2. Vasculitis continue to monitor she will need follow-up after discharge 3. Chronic atrial fibrillation rate is controlled 4. Chronic diastolic heart failure compensated   I have personally evaluated the patient, evaluated the laboratory and imaging results and formulated the assessment and plan and placed orders as needed. The Patient requires high complexity decision making for assessment and support. I have discussed the patient on rounds with the Respiratory Staff   Allyne Gee, MD [facility] Pulmonary Critical Care Medicine

## 2018-04-15 ENCOUNTER — Other Ambulatory Visit (HOSPITAL_COMMUNITY): Payer: Medicare Other | Admitting: Internal Medicine

## 2018-04-15 DIAGNOSIS — I482 Chronic atrial fibrillation, unspecified: Secondary | ICD-10-CM

## 2018-04-15 DIAGNOSIS — I776 Arteritis, unspecified: Secondary | ICD-10-CM | POA: Diagnosis not present

## 2018-04-15 DIAGNOSIS — J9621 Acute and chronic respiratory failure with hypoxia: Secondary | ICD-10-CM | POA: Diagnosis not present

## 2018-04-15 DIAGNOSIS — G4733 Obstructive sleep apnea (adult) (pediatric): Secondary | ICD-10-CM

## 2018-04-15 DIAGNOSIS — I5031 Acute diastolic (congestive) heart failure: Secondary | ICD-10-CM

## 2018-04-15 DIAGNOSIS — I5033 Acute on chronic diastolic (congestive) heart failure: Secondary | ICD-10-CM

## 2018-04-15 DIAGNOSIS — I7782 Antineutrophilic cytoplasmic antibody (ANCA) vasculitis: Secondary | ICD-10-CM

## 2018-04-15 NOTE — Progress Notes (Signed)
[facility]  PROGRESS NOTE  PULMONARY SERVICE ROUNDS  Date of Service: 04/15/2018  Betty [name]  DOB: 11-20-1946  Referring physician: Deanne Coffer, MD  HPI: Betty [name] is a 72 y.o. female  being seen for Acute on Chronic Respiratory Failure.  She is decannulated looks good has been tolerating very well.  Currently is without distress working with rehab therapy.  Review of Systems: Unremarkable other than noted in HPI  Allergies:  Reviewed on the [facility]  Medications: Reviewed  Vitals: Temperature 96.4 pulse 85 respiratory rate 18 blood pressure 118/66 saturations 100%.  Ventilator Settings: Decannulated  Physical Exam: . General:  calm and comfortable NAD . Eyes: normal lids, irises & conjunctiva . ENT: grossly normal tongue not enlarged . Neck: no masses . Cardiovascular: S1 S2 Normal no rubs no gallop . Respiratory: Clear . Abdomen: Soft nontender . Skin: no rash seen on limited exam . Musculoskeletal:  no rigidity . Psychiatric: unable to assess . Neurologic: no involuntary movements          Lab Data:  Reviewed  Radiological Data: Reviewed  Assessment/Plan  Patient Active Problem List   Diagnosis Date Noted  . Respiratory failure, acute ([place]) 02/26/2018  . COPD with acute exacerbation ([place]) 02/26/2018  . C2 cervical fracture ([place]) 02/19/2018  . Hematuria 01/27/2018  . Carotid stenosis 12/23/2017  . Varicose veins of both lower extremities with pain 11/17/2017  . Acute on chronic diastolic CHF (congestive heart failure) ([place]) 08/13/2017  . Leg pain 07/14/2017  . Chronic venous insufficiency 07/14/2017  . PAD (peripheral artery disease) ([place]) 07/14/2017  . Postprocedural hemorrhage due to complication of oral surgery 03/27/2017  . Acute blood loss anemia 03/27/2017  . H/O mitral valve replacement with mechanical valve 03/27/2017  . Atypical atrial flutter ([place]) 01/23/2017  . Long term current use of anticoagulants with INR goal of  2.5-3.5 09/18/2016  . Syncope 06/25/2016  . UTI (lower urinary tract infection) 06/25/2016  . Afib ([place]) 05/10/2016  . Dyspnea 05/10/2016  . Acute diastolic CHF (congestive heart failure) ([place]) 05/10/2016  . Anemia 05/10/2016  . Anemia 05/10/2016  . Other specified abnormal immunological findings in serum 04/24/2016  . Sepsis ([place]) 04/01/2016  . Prolonged Q-T interval on ECG 03/13/2016  . Mitral stenosis 03/13/2016  . Essential hypertension, benign 03/13/2016  . Diastolic congestive heart failure ([place]) 03/13/2016  . Interstitial lung disease ([place]) 12/29/2015  . Chronic LBP 07/26/2015  . Essential (primary) hypertension 07/26/2015  . Idiopathic insomnia 07/26/2015  . Restless leg 07/26/2015  . Obesity (BMI 30-39.9) 01/07/2015  . Abnormal EKG 09/11/2014  . External nasal lesion 02/26/2014  . Abnormal liver function tests 01/28/2014  . Ankle pain, right 01/28/2014  . Arthralgia of ankle or foot 01/28/2014  . Urinary frequency 12/24/2013  . Hemorrhoids 12/24/2013  . Neck pain on right side 11/10/2013  . Vitamin D deficiency 09/09/2013  . Obstructive sleep apnea 05/30/2013  . Urinary incontinence 05/30/2013  . Diabetes ([place]) 05/30/2013  . History of colonic polyps 05/30/2013  . Controlled type 2 diabetes mellitus without complication ([place]) 93/71/6967  . H/O deep venous thrombosis 05/05/2013  . History of pulmonary embolism 05/05/2013  . 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 OSA (obstructive sleep apnea) 05/05/2013  . Depression 04/13/2009  . THYROID NODULE 04/13/2009  . 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 GERD 04/13/2009  . OSTEOARTHRITIS 04/13/2009  . BACK PAIN 04/13/2009  . HEADACHE 04/13/2009  . Gastro-esophageal reflux disease without esophagitis 04/13/2009  . Mild episode of  recurrent major depressive disorder ([place]) 04/13/2009  . 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 ATRIAL FIBRILLATION 03/28/2009  . DIASTOLIC  HEART FAILURE, ACUTE 03/28/2009  . VENTRICULAR HYPERTROPHY, LEFT 03/28/2009  . Paroxysmal atrial fibrillation ([place]) 03/28/2009  . Hypertensive cardiomyopathy, with heart failure ([place]) 03/28/2009      1. Acute on chronic respiratory failure with hypoxia we will continue with supportive care she is now D cannulated. 2. Vasculitis we will follow-up as outpatient 3. Chronic atrial fibrillation rate is controlled we will continue supportive care   I have personally evaluated the patient, evaluated the laboratory and imaging results and formulated the assessment and plan and placed orders as needed. The Patient requires high complexity decision making for assessment and support. I have discussed the patient on rounds with the Respiratory Staff   Allyne Gee, MD [facility] Pulmonary Critical Care Medicine

## 2018-09-24 ENCOUNTER — Other Ambulatory Visit: Payer: Self-pay | Admitting: Family Medicine

## 2018-09-24 DIAGNOSIS — Z1231 Encounter for screening mammogram for malignant neoplasm of breast: Secondary | ICD-10-CM

## 2018-10-15 ENCOUNTER — Ambulatory Visit
Admission: RE | Admit: 2018-10-15 | Discharge: 2018-10-15 | Disposition: A | Discharge status: Home / Self Care | Payer: Medicare Other | Source: Ambulatory Visit | Attending: Family Medicine | Admitting: Family Medicine

## 2018-10-15 DIAGNOSIS — Z1231 Encounter for screening mammogram for malignant neoplasm of breast: Secondary | ICD-10-CM

## 2018-10-15 HISTORY — DX: Malignant (primary) neoplasm, unspecified: C80.1

## 2018-10-26 ENCOUNTER — Emergency Department: Payer: Medicare Other

## 2018-10-26 ENCOUNTER — Other Ambulatory Visit: Payer: Self-pay

## 2018-10-26 ENCOUNTER — Inpatient Hospital Stay
Admission: EM | Admit: 2018-10-26 | Discharge: 2018-11-03 | DRG: 872 | Disposition: A | Discharge status: Home / Self Care | Payer: Medicare Other | Attending: Internal Medicine | Admitting: Internal Medicine

## 2018-10-26 DIAGNOSIS — I480 Paroxysmal atrial fibrillation: Secondary | ICD-10-CM | POA: Diagnosis present

## 2018-10-26 DIAGNOSIS — I5032 Chronic diastolic (congestive) heart failure: Secondary | ICD-10-CM | POA: Diagnosis present

## 2018-10-26 DIAGNOSIS — Z7189 Other specified counseling: Secondary | ICD-10-CM | POA: Diagnosis not present

## 2018-10-26 DIAGNOSIS — I110 Hypertensive heart disease with heart failure: Secondary | ICD-10-CM | POA: Diagnosis present

## 2018-10-26 DIAGNOSIS — R109 Unspecified abdominal pain: Secondary | ICD-10-CM

## 2018-10-26 DIAGNOSIS — T502X5A Adverse effect of carbonic-anhydrase inhibitors, benzothiadiazides and other diuretics, initial encounter: Secondary | ICD-10-CM | POA: Diagnosis present

## 2018-10-26 DIAGNOSIS — I7782 Antineutrophilic cytoplasmic antibody (ANCA) vasculitis: Secondary | ICD-10-CM

## 2018-10-26 DIAGNOSIS — A021 Salmonella sepsis: Secondary | ICD-10-CM | POA: Diagnosis present

## 2018-10-26 DIAGNOSIS — Z7901 Long term (current) use of anticoagulants: Secondary | ICD-10-CM

## 2018-10-26 DIAGNOSIS — I10 Essential (primary) hypertension: Secondary | ICD-10-CM | POA: Diagnosis present

## 2018-10-26 DIAGNOSIS — N390 Urinary tract infection, site not specified: Secondary | ICD-10-CM | POA: Diagnosis present

## 2018-10-26 DIAGNOSIS — Z515 Encounter for palliative care: Secondary | ICD-10-CM

## 2018-10-26 DIAGNOSIS — A419 Sepsis, unspecified organism: Secondary | ICD-10-CM

## 2018-10-26 DIAGNOSIS — E119 Type 2 diabetes mellitus without complications: Secondary | ICD-10-CM | POA: Diagnosis present

## 2018-10-26 DIAGNOSIS — M7981 Nontraumatic hematoma of soft tissue: Secondary | ICD-10-CM | POA: Diagnosis present

## 2018-10-26 DIAGNOSIS — J449 Chronic obstructive pulmonary disease, unspecified: Secondary | ICD-10-CM | POA: Diagnosis present

## 2018-10-26 DIAGNOSIS — I776 Arteritis, unspecified: Secondary | ICD-10-CM | POA: Diagnosis present

## 2018-10-26 DIAGNOSIS — Z888 Allergy status to other drugs, medicaments and biological substances status: Secondary | ICD-10-CM | POA: Diagnosis not present

## 2018-10-26 DIAGNOSIS — A4159 Other Gram-negative sepsis: Secondary | ICD-10-CM | POA: Diagnosis not present

## 2018-10-26 DIAGNOSIS — Z952 Presence of prosthetic heart valve: Secondary | ICD-10-CM

## 2018-10-26 DIAGNOSIS — R1032 Left lower quadrant pain: Secondary | ICD-10-CM | POA: Diagnosis not present

## 2018-10-26 DIAGNOSIS — E876 Hypokalemia: Secondary | ICD-10-CM | POA: Diagnosis present

## 2018-10-26 LAB — COMPREHENSIVE METABOLIC PANEL
ALT: 21 U/L (ref 0–44)
AST: 39 U/L (ref 15–41)
Albumin: 4.1 g/dL (ref 3.5–5.0)
Alkaline Phosphatase: 109 U/L (ref 38–126)
Anion gap: 11 (ref 5–15)
BUN: 28 mg/dL — AB (ref 8–23)
CHLORIDE: 92 mmol/L — AB (ref 98–111)
CO2: 31 mmol/L (ref 22–32)
CREATININE: 1.15 mg/dL — AB (ref 0.44–1.00)
Calcium: 8.8 mg/dL — ABNORMAL LOW (ref 8.9–10.3)
GFR, EST AFRICAN AMERICAN: 54 mL/min — AB (ref >60)
GFR, EST NON AFRICAN AMERICAN: 47 mL/min — AB (ref >60)
Glucose, Bld: 161 mg/dL — ABNORMAL HIGH (ref 70–99)
Potassium: 3.4 mmol/L — ABNORMAL LOW (ref 3.5–5.1)
Sodium: 134 mmol/L — ABNORMAL LOW (ref 135–145)
Total Bilirubin: 1.2 mg/dL (ref 0.3–1.2)
Total Protein: 7.4 g/dL (ref 6.5–8.1)

## 2018-10-26 LAB — CBC WITH DIFFERENTIAL/PLATELET
ABS IMMATURE GRANULOCYTES: 0.16 10*3/uL — AB (ref 0.00–0.07)
Basophils Absolute: 0.1 10*3/uL (ref 0.0–0.1)
Basophils Relative: 0 %
EOS PCT: 1 %
Eosinophils Absolute: 0.1 10*3/uL (ref 0.0–0.5)
HEMATOCRIT: 41.5 % (ref 36.0–46.0)
HEMOGLOBIN: 13.2 g/dL (ref 12.0–15.0)
Immature Granulocytes: 1 %
LYMPHS ABS: 0.1 10*3/uL — AB (ref 0.7–4.0)
LYMPHS PCT: 1 %
MCH: 29.3 pg (ref 26.0–34.0)
MCHC: 31.8 g/dL (ref 30.0–36.0)
MCV: 92 fL (ref 80.0–100.0)
MONO ABS: 0.7 10*3/uL (ref 0.1–1.0)
MONOS PCT: 4 %
NEUTROS ABS: 15.2 10*3/uL — AB (ref 1.7–7.7)
Neutrophils Relative %: 93 %
Platelets: 225 10*3/uL (ref 150–400)
RBC: 4.51 MIL/uL (ref 3.87–5.11)
RDW: 17 % — ABNORMAL HIGH (ref 11.5–15.5)
WBC: 16.3 10*3/uL — ABNORMAL HIGH (ref 4.0–10.5)
nRBC: 0 % (ref 0.0–0.2)

## 2018-10-26 LAB — URINALYSIS, COMPLETE (UACMP) WITH MICROSCOPIC
BACTERIA UA: NONE SEEN
Bilirubin Urine: NEGATIVE
GLUCOSE, UA: NEGATIVE mg/dL
KETONES UR: NEGATIVE mg/dL
Nitrite: NEGATIVE
PROTEIN: NEGATIVE mg/dL
Specific Gravity, Urine: 1.006 (ref 1.005–1.030)
pH: 6 (ref 5.0–8.0)

## 2018-10-26 LAB — LACTIC ACID, PLASMA: LACTIC ACID, VENOUS: 2.8 mmol/L — AB (ref 0.5–1.9)

## 2018-10-26 LAB — PROTIME-INR
INR: 1.36
PROTHROMBIN TIME: 16.6 s — AB (ref 11.4–15.2)

## 2018-10-26 MED ORDER — SODIUM CHLORIDE 0.9 % IV BOLUS
1000.0000 mL | Freq: Once | INTRAVENOUS | Status: AC
  Administered 2018-10-26: 1000 mL via INTRAVENOUS

## 2018-10-26 MED ORDER — ACETAMINOPHEN 650 MG RE SUPP: 650.0000 mg | Freq: Four times a day (QID) | RECTAL | Status: DC | PRN

## 2018-10-26 MED ORDER — ACETAMINOPHEN 325 MG PO TABS
650.0000 mg | ORAL_TABLET | Freq: Four times a day (QID) | ORAL | Status: DC | PRN
  Administered 2018-10-27 – 2018-11-03 (×16): 650 mg via ORAL
  Filled 2018-10-26 (×18): qty 2

## 2018-10-26 MED ORDER — SODIUM CHLORIDE 0.9 % IV SOLN
1.0000 g | Freq: Once | INTRAVENOUS | Status: AC
  Administered 2018-10-26: 1 g via INTRAVENOUS
  Filled 2018-10-26: qty 1

## 2018-10-26 NOTE — ED Provider Notes (Signed)
[facility] Emergency Department Provider Note  ____________________________________________   First MD Initiated Contact with Patient 10/26/18 2146     (approximate)  I have reviewed the triage vital signs and the nursing notes.   HISTORY  Chief Complaint Code Sepsis and Fatigue   HPI Betty [name] is a 72 y.o. female who presents to the emergency department for treatment and evaluation after being found by her son this evening.  He states that she was lethargic.  She is supposed to be on 2 L of oxygen via nasal cannula and her tank was set on 1 L with a very long hose attached to it.  Initially, she was hypoxic but that resolved with bump in her oxygen up to 3 L.  Per EMS, and oral temperature was 103 with a heart rate of 104.  EMS state that she was not verbal upon arrival or in route.  Past Medical History:  Diagnosis Date  . Acute diastolic heart failure ([place])   . Allergy   . 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 Asthma   . Atrial fibrillation ([place])   . Backache, unspecified   . Cancer ([place])    skin  . Cardiomegaly   . COPD (chronic obstructive pulmonary disease) ([place])   . Diabetes mellitus without complication ([place])   . 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 Esophageal reflux   . Headache(784.0)   . Herpes zoster without mention of complication   . Hx: UTI (urinary tract infection)   . Hypertension    heart controlled w CHF  . Nontoxic uninodular goiter   . Obesity, unspecified   . Osteoarthrosis, unspecified whether generalized or localized, unspecified site   . Unspecified sleep apnea   . Urine incontinence    hx of    Patient Active Problem List   Diagnosis Date Noted  . Respiratory failure, acute ([place]) 02/26/2018  . COPD (chronic obstructive pulmonary disease) ([place]) 02/26/2018  . C2 cervical fracture ([place]) 02/19/2018  . Hematuria 01/27/2018  . Carotid stenosis 12/23/2017  . Varicose veins of both  lower extremities with pain 11/17/2017  . Acute on chronic diastolic CHF (congestive heart failure) ([place]) 08/13/2017  . Leg pain 07/14/2017  . Chronic venous insufficiency 07/14/2017  . PAD (peripheral artery disease) ([place]) 07/14/2017  . Postprocedural hemorrhage due to complication of oral surgery 03/27/2017  . Acute blood loss anemia 03/27/2017  . H/O mitral valve replacement with mechanical valve 03/27/2017  . Atypical atrial flutter ([place]) 01/23/2017  . Long term current use of anticoagulants with INR goal of 2.5-3.5 09/18/2016  . Syncope 06/25/2016  . UTI (urinary tract infection) 06/25/2016  . Afib ([place]) 05/10/2016  . Dyspnea 05/10/2016  . Chronic diastolic CHF (congestive heart failure) ([place]) 05/10/2016  . Anemia 05/10/2016  . Anemia 05/10/2016  . Other specified abnormal immunological findings in serum 04/24/2016  . Sepsis ([place]) 04/01/2016  . Prolonged Q-T interval on ECG 03/13/2016  . Mitral stenosis 03/13/2016  . Interstitial lung disease ([place]) 12/29/2015  . Chronic LBP 07/26/2015  . Essential (primary) hypertension 07/26/2015  . Idiopathic insomnia 07/26/2015  . Restless leg 07/26/2015  . Obesity (BMI 30-39.9) 01/07/2015  . Abnormal EKG 09/11/2014  . External nasal lesion 02/26/2014  . Abnormal liver function tests 01/28/2014  . Ankle pain, right 01/28/2014  . Arthralgia of ankle or foot 01/28/2014  . Urinary frequency 12/24/2013  . Hemorrhoids 12/24/2013  . Neck pain on right side 11/10/2013  .  Vitamin D deficiency 09/09/2013  . Urinary incontinence 05/30/2013  . History of colonic polyps 05/30/2013  . Controlled type 2 diabetes mellitus without complication ([place]) 47/65/4650  . H/O deep venous thrombosis 05/05/2013  . History of pulmonary embolism 05/05/2013  . 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 OSA (obstructive sleep apnea) 05/05/2013  . Depression 04/13/2009  . THYROID NODULE 04/13/2009  . 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 GERD  04/13/2009  . OSTEOARTHRITIS 04/13/2009  . BACK PAIN 04/13/2009  . HEADACHE 04/13/2009  . Gastro-esophageal reflux disease without esophagitis 04/13/2009  . Mild episode of recurrent major depressive disorder ([place]) 04/13/2009  . 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 PAF (paroxysmal atrial fibrillation) ([place]) 03/28/2009  . VENTRICULAR HYPERTROPHY, LEFT 03/28/2009  . Hypertensive cardiomyopathy, with heart failure ([place]) 03/28/2009    Past Surgical History:  Procedure Laterality Date  . ABDOMINAL HYSTERECTOMY  1979   complete (for precancerous cells)  . ABLATION  2011 & 2014  . bladder botox  01/26/2018  . CHOLECYSTECTOMY    . CYSTOSCOPY WITH FULGERATION N/A 01/28/2018   Procedure: [place] AND CLOT EVACUATION;  Surgeon: Hollice Espy, MD;  Location: [facility] ORS;  Service: Urology;  Laterality: N/A;  . Interstim Placement  2012  . OOPHORECTOMY      Prior to Admission medications   Medication Sig Start Date End Date Taking? Authorizing Provider  acetaminophen (TYLENOL) 325 MG tablet Take 2 tablets (650 mg total) by mouth every 6 (six) hours as needed for mild pain (or Fever >/= 101). 02/04/18   Gladstone Lighter, MD  albuterol (PROVENTIL) (2.5 MG/3ML) 0.083% nebulizer solution Take 3 mLs (2.5 mg total) by nebulization every 4 (four) hours as needed for wheezing or shortness of breath. 06/28/16   Gladstone Lighter, MD  atorvastatin (LIPITOR) 20 MG tablet Take 20 mg by mouth daily.    [provider]  azaTHIOprine (IMURAN) 50 MG tablet Take 100 mg by mouth daily.     [provider]  enoxaparin (LOVENOX) 100 MG/ML injection Inject 0.95 mLs (95 mg total) into the skin every 12 (twelve) hours. X 3-4 days until INR reaches goal of 2.5 Patient not taking: Reported on 02/19/2018 02/04/18   Gladstone Lighter, MD  ferrous sulfate 325 (65 FE) MG EC tablet Take 325 mg by mouth daily.  06/23/17   [provider]  gabapentin  (NEURONTIN) 600 MG tablet Take 600 mg by mouth 2 (two) times daily.    [provider]  guaiFENesin-dextromethorphan (ROBITUSSIN DM) 100-10 MG/5ML syrup Take 5 mLs by mouth every 4 (four) hours as needed for cough. 02/04/18   Gladstone Lighter, MD  levocetirizine (XYZAL) 5 MG tablet Take 5 mg by mouth at bedtime. 10/15/18   [provider]  metFORMIN (GLUCOPHAGE) 1000 MG tablet Take 1,000 mg by mouth 2 (two) times daily with a meal.     [provider]  metolazone (ZAROXOLYN) 2.5 MG tablet Take 2.5 mg by mouth 2 (two) times a week. Take 2.5 mg by mouth on Monday and Thursday.    [provider]  metoprolol tartrate (LOPRESSOR) 25 MG tablet Take 25 mg by mouth 2 (two) times daily. 10/21/18   [provider]  pantoprazole (PROTONIX) 40 MG tablet Take 40 mg by mouth daily. 10/15/18   [provider]  potassium chloride (MICRO-K) 10 MEQ CR capsule Take 20 mEq by mouth 2 (two) times daily. 10/26/18   [provider]  rOPINIRole (REQUIP) 1 MG tablet Take 1  mg by mouth at bedtime. 09/18/18   [provider]  spironolactone (ALDACTONE) 25 MG tablet Take 12.5 mg by mouth daily. 10/06/18   [provider]  traZODone (DESYREL) 50 MG tablet Take 50 mg by mouth at bedtime as needed for sleep. 10/26/18   [provider]  venlafaxine XR (EFFEXOR-XR) 150 MG 24 hr capsule Take 150 mg by mouth daily. 10/02/18   [provider]  Vitamin D, Ergocalciferol, (DRISDOL) 50000 units CAPS capsule Take 50,000 Units by mouth once a week. 10/21/18   [provider]    Allergies Flecainide and Amiodarone  Family History  Problem Relation Age of Onset  . Heart attack Father   . Heart failure Father   . Arthritis Father   . Stroke Father   . Hypertension Father   . Coronary artery disease Brother   . Peripheral vascular disease Brother   . Arthritis Mother   . Colon cancer Mother        colon cancer  . Hypertension  Mother   . Cancer Maternal Grandmother        colon cancer  . Arthritis Maternal Grandmother     Social History Social History   Tobacco Use  . Smoking status: Former Smoker    Packs/day: 0.50    Years: 15.00    Pack years: 7.50    Types: Cigarettes  . Smokeless tobacco: Never Used  . Tobacco comment: Has a 20-pack-year history, qutting in 1970.   Substance Use Topics  . Alcohol use: No    Alcohol/week: 0.0 standard drinks  . Drug use: No    Review of Systems  Level 5 caveat, patient obtunded  ____________________________________________   PHYSICAL EXAM:  VITAL SIGNS: ED Triage Vitals  Enc Vitals Group     BP 10/26/18 2146 107/70     Pulse Rate 10/26/18 2146 96     Resp 10/26/18 2146 17     Temp 10/26/18 2146 (!) 103.6 F (39.8 C)     Temp Source 10/26/18 2146 Oral     SpO2 10/26/18 2146 97 %     Weight 10/26/18 2217 218 lb 7.6 oz (99.1 kg)     Height 10/26/18 2217 5\' 9"  (1.753 m)     Head Circumference --      Peak Flow --      Pain Score 10/26/18 2216 0     Pain Loc --      Pain Edu? --      Excl. in [place]? --     Constitutional: Awake Eyes: Conjunctivae are normal.  Rhythmic eye movement reportedly normal. Head: Atraumatic. Nose: No congestion/rhinnorhea. Mouth/Throat: Mucous membranes are moist.   Neck: No stridor.   Cardiovascular: Normal rate, regular rhythm. Grossly normal heart sounds.  Good peripheral circulation. Respiratory: Normal respiratory effort.  No retractions. Lungs: crackles at the bases bilaterally. Gastrointestinal: Soft, moans with palpation but does not localize pain. Musculoskeletal: No lower extremity tenderness nor edema.  Neurologic: Obtunded. Will answer some direct questions.  Skin:  Skin is warm, dry and intact. No rash or wound noted. ____________________________________________   LABS (all labs ordered are listed, but only abnormal results are displayed)  Labs Reviewed  COMPREHENSIVE METABOLIC PANEL - Abnormal;  Notable for the following components:      Result Value   Sodium 134 (*)    Potassium 3.4 (*)    Chloride 92 (*)    Glucose, Bld 161 (*)    BUN 28 (*)    Creatinine,  Ser 1.15 (*)    Calcium 8.8 (*)    GFR calc non Af Amer 47 (*)    GFR calc Af Amer 54 (*)    All other components within normal limits  CBC WITH DIFFERENTIAL/PLATELET - Abnormal; Notable for the following components:   WBC 16.3 (*)    RDW 17.0 (*)    Neutro Abs 15.2 (*)    Lymphs Abs 0.1 (*)    Abs Immature Granulocytes 0.16 (*)    All other components within normal limits  URINALYSIS, COMPLETE (UACMP) WITH MICROSCOPIC - Abnormal; Notable for the following components:   Color, Urine STRAW (*)    APPearance CLEAR (*)    Hgb urine dipstick SMALL (*)    Leukocytes, UA SMALL (*)    All other components within normal limits  PROTIME-INR - Abnormal; Notable for the following components:   Prothrombin Time 16.6 (*)    All other components within normal limits  LACTIC ACID, PLASMA - Abnormal; Notable for the following components:   Lactic Acid, Venous 2.8 (*)    All other components within normal limits  CULTURE, BLOOD (ROUTINE X 2)  CULTURE, BLOOD (ROUTINE X 2)  BLOOD GAS, VENOUS  LACTIC ACID, PLASMA  I-STAT CG4 LACTIC ACID, ED   ____________________________________________  EKG   ____________________________________________  RADIOLOGY  ED MD interpretation: Chronic interstitial opacities unchanged from previous chest x-ray  Official radiology report(s): Dg Chest Portable 1 View  Result Date: 10/26/2018 CLINICAL DATA:  Shortness of breath and altered mental status EXAM: PORTABLE CHEST 1 VIEW COMPARISON:  02/28/2018 chest radiograph FINDINGS: Left chest wall pacemaker leads are in unchanged position. Sequelae of median sternotomy and valve replacement. There is shallow lung inflation with mild interstitial opacity. No pneumothorax or sizable pleural effusion. Mild cardiomegaly is unchanged. There is calcific  aortic atherosclerosis. IMPRESSION: Shallow lung inflation with chronic interstitial opacities, unchanged. Electronically Signed   By: Ulyses Jarred M.D.   On: 10/26/2018 22:16    ____________________________________________   PROCEDURES  Procedure(s) performed: None  Procedures  Critical Care performed: No  ____________________________________________   INITIAL IMPRESSION / ASSESSMENT AND PLAN / ED COURSE  As part of my medical decision making, I reviewed the following data within the electronic MEDICAL RECORD NUMBER Notes from prior ED visits   72 year old female presenting to the emergency department for treatment and evaluation of lethargy, hypoxia, and fever.  Patient was found to be septic and a code sepsis was called in route.  Fluids were initiated as well as IV antibiotics.  Upon arrival to the emergency department she was more awake than was reported by EMS.  Her lactic acid is elevated as well as her white blood cell count.  Patient will be admitted due to sepsis.  CRITICAL CARE Performed by: Sherrie George   Total critical care time: 30 minutes  Critical care time was exclusive of separately billable procedures and treating other patients.  Critical care was necessary to treat or prevent imminent or life-threatening deterioration.  Critical care was time spent personally by me on the following activities: development of treatment plan with patient and/or surrogate as well as nursing, discussions with consultants, evaluation of patient's response to treatment, examination of patient, obtaining history from patient or surrogate, ordering and performing treatments and interventions, ordering and review of laboratory studies, ordering and review of radiographic studies, pulse oximetry and re-evaluation of patient's condition.  ___________________________________________   FINAL CLINICAL IMPRESSION(S) / ED DIAGNOSES  Final diagnoses:  Sepsis due to Salmonella species  without acute organ dysfunction [facility])     ED Discharge Orders    None       Note:  This document was prepared using Dragon voice recognition software and may include unintentional dictation errors.    Victorino Dike, FNP 10/27/18 [id number]    Nena Polio, MD 10/30/18 [phone]

## 2018-10-26 NOTE — H&P (Signed)
[place] [facility] [place] NAME: Betty [name]    MR#:  [id number]  DATE OF BIRTH:  1946-10-21  DATE OF ADMISSION:  10/26/2018  PRIMARY CARE PHYSICIAN: Dion Body, MD   REQUESTING/REFERRING PHYSICIAN: Cinda Quest, MD  CHIEF COMPLAINT:   Chief Complaint  Patient presents with  . Code Sepsis  . Fatigue    HISTORY OF PRESENT ILLNESS:  Betty [name]  is a 72 y.o. female who presents with chief complaint as above.  Patient presents the ED with an episode of weakness and confusion today.  Family is at bedside and states that the patient was lethargic and unable to answer questions coherently.  She has a history of frequent UTIs, and is found to have the same here in the ED today.  She presented febrile, and meet sepsis criteria.  Her clinical picture has improved some here in the ED after administration of antibiotics and IV fluids.  Some question on x-ray of possible pneumonia as well, and patient does state that she has had a cough recently.  However, chest x-ray shows significant chronic scarring, making it difficult to interpret absolutely.  Hospitalist called for admission  PAST MEDICAL HISTORY:   Past Medical History:  Diagnosis Date  . Acute diastolic heart failure ([place])   . Allergy   . 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 Asthma   . Atrial fibrillation ([place])   . Backache, unspecified   . Cancer ([place])    skin  . Cardiomegaly   . COPD (chronic obstructive pulmonary disease) ([place])   . Diabetes mellitus without complication ([place])   . 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 Esophageal reflux   . Headache(784.0)   . Herpes zoster without mention of complication   . Hx: UTI (urinary tract infection)   . Hypertension    heart controlled w CHF  . Nontoxic uninodular goiter   . Obesity, unspecified   . Osteoarthrosis, unspecified whether generalized or localized, unspecified site   . Unspecified sleep apnea    . Urine incontinence    hx of     PAST SURGICAL HISTORY:   Past Surgical History:  Procedure Laterality Date  . ABDOMINAL HYSTERECTOMY  1979   complete (for precancerous cells)  . ABLATION  2011 & 2014  . bladder botox  01/26/2018  . CHOLECYSTECTOMY    . CYSTOSCOPY WITH FULGERATION N/A 01/28/2018   Procedure: [place] AND CLOT EVACUATION;  Surgeon: Hollice Espy, MD;  Location: [facility] ORS;  Service: Urology;  Laterality: N/A;  . Interstim Placement  2012  . OOPHORECTOMY       SOCIAL HISTORY:   Social History   Tobacco Use  . Smoking status: Former Smoker    Packs/day: 0.50    Years: 15.00    Pack years: 7.50    Types: Cigarettes  . Smokeless tobacco: Never Used  . Tobacco comment: Has a 20-pack-year history, qutting in 1970.   Substance Use Topics  . Alcohol use: No    Alcohol/week: 0.0 standard drinks     FAMILY HISTORY:   Family History  Problem Relation Age of Onset  . Heart attack Father   . Heart failure Father   . Arthritis Father   . Stroke Father   . Hypertension Father   . Coronary artery disease Brother   . Peripheral vascular disease Brother   . Arthritis Mother   . Colon cancer Mother  colon cancer  . Hypertension Mother   . Cancer Maternal Grandmother        colon cancer  . Arthritis Maternal Grandmother      DRUG ALLERGIES:   Allergies  Allergen Reactions  . Flecainide Shortness Of Breath and Other (See Comments)    Reaction: dizziness   . Amiodarone Other (See Comments)    Pt states that this medication causes lung bleeding.      MEDICATIONS AT HOME:   Prior to Admission medications   Medication Sig Start Date End Date Taking? Authorizing Provider  acetaminophen (TYLENOL) 325 MG tablet Take 2 tablets (650 mg total) by mouth every 6 (six) hours as needed for mild pain (or Fever >/= 101). 02/04/18   Gladstone Lighter, MD  albuterol (PROVENTIL) (2.5 MG/3ML) 0.083% nebulizer solution Take 3 mLs (2.5 mg  total) by nebulization every 4 (four) hours as needed for wheezing or shortness of breath. 06/28/16   Gladstone Lighter, MD  atorvastatin (LIPITOR) 20 MG tablet Take 20 mg by mouth daily.    [provider]  azaTHIOprine (IMURAN) 50 MG tablet Take 100 mg by mouth daily.     [provider]  enoxaparin (LOVENOX) 100 MG/ML injection Inject 0.95 mLs (95 mg total) into the skin every 12 (twelve) hours. X 3-4 days until INR reaches goal of 2.5 Patient not taking: Reported on 02/19/2018 02/04/18   Gladstone Lighter, MD  ferrous sulfate 325 (65 FE) MG EC tablet Take 325 mg by mouth daily.  06/23/17   [provider]  gabapentin (NEURONTIN) 600 MG tablet Take 600 mg by mouth 2 (two) times daily.    [provider]  guaiFENesin-dextromethorphan (ROBITUSSIN DM) 100-10 MG/5ML syrup Take 5 mLs by mouth every 4 (four) hours as needed for cough. 02/04/18   Gladstone Lighter, MD  levocetirizine (XYZAL) 5 MG tablet Take 5 mg by mouth at bedtime. 10/15/18   [provider]  metFORMIN (GLUCOPHAGE) 1000 MG tablet Take 1,000 mg by mouth 2 (two) times daily with a meal.     [provider]  metolazone (ZAROXOLYN) 2.5 MG tablet Take 2.5 mg by mouth 2 (two) times a week. Take 2.5 mg by mouth on Monday and Thursday.    [provider]  metoprolol tartrate (LOPRESSOR) 25 MG tablet Take 25 mg by mouth 2 (two) times daily. 10/21/18   [provider]  pantoprazole (PROTONIX) 40 MG tablet Take 40 mg by mouth daily. 10/15/18   [provider]  potassium chloride (MICRO-K) 10 MEQ CR capsule Take 20 mEq by mouth 2 (two) times daily. 10/26/18   [provider]  rOPINIRole (REQUIP) 1 MG tablet Take 1 mg by mouth at bedtime. 09/18/18   [provider]  spironolactone (ALDACTONE) 25 MG tablet Take 12.5 mg by mouth daily. 10/06/18   [provider]  traZODone (DESYREL) 50 MG tablet Take 50 mg by mouth at bedtime as needed for sleep.  10/26/18   [provider]  venlafaxine XR (EFFEXOR-XR) 150 MG 24 hr capsule Take 150 mg by mouth daily. 10/02/18   [provider]  Vitamin D, Ergocalciferol, (DRISDOL) 50000 units CAPS capsule Take 50,000 Units by mouth once a week. 10/21/18   [provider]    REVIEW OF SYSTEMS:  Review of Systems  Constitutional: Positive for malaise/fatigue. Negative for chills, fever and weight loss.  HENT: Negative for ear pain, hearing loss and tinnitus.   Eyes: Negative for blurred vision, double vision, pain and redness.  Respiratory:  Positive for cough. Negative for hemoptysis and shortness of breath.   Cardiovascular: Negative for chest pain, palpitations, orthopnea and leg swelling.  Gastrointestinal: Negative for abdominal pain, constipation, diarrhea, nausea and vomiting.  Genitourinary: Negative for dysuria, frequency and hematuria.  Musculoskeletal: Negative for back pain, joint pain and neck pain.  Skin:       No acne, rash, or lesions  Neurological: Negative for dizziness, tremors, focal weakness and weakness.       Confusion  Endo/Heme/Allergies: Negative for polydipsia. Does not bruise/bleed easily.  Psychiatric/Behavioral: Negative for depression. The patient is not nervous/anxious and does not have insomnia.      VITAL SIGNS:   Vitals:   10/26/18 2215 10/26/18 2217 10/26/18 2230 10/26/18 2335  BP: 108/61  (!) 99/55 114/85  Pulse: 90   70  Resp: 18  20 (!) 22  Temp:      TempSrc:      SpO2: 100%   93%  Weight:  99.1 kg    Height:  5\' 9"  (1.753 m)     Wt Readings from Last 3 Encounters:  10/26/18 99.1 kg  03/01/18 99.1 kg  02/10/18 96.2 kg    PHYSICAL EXAMINATION:  Physical Exam  Vitals reviewed. Constitutional: She is oriented to person, place, and time. She appears well-developed and well-nourished. No distress.  HENT:  Head: Normocephalic and atraumatic.  Mouth/Throat: Oropharynx is clear and moist.  Eyes: Pupils are equal, round, and  reactive to light. Conjunctivae and EOM are normal. No scleral icterus.  Neck: Normal range of motion. Neck supple. No JVD present. No thyromegaly present.  Cardiovascular: Normal rate, regular rhythm and intact distal pulses. Exam reveals no gallop and no friction rub.  No murmur heard. Respiratory: Effort normal and breath sounds normal. No respiratory distress. She has no wheezes. She has no rales.  GI: Soft. Bowel sounds are normal. She exhibits no distension. There is no tenderness.  Musculoskeletal: Normal range of motion. She exhibits no edema.  No arthritis, no gout  Lymphadenopathy:    She has no cervical adenopathy.  Neurological: She is alert and oriented to person, place, and time. No cranial nerve deficit.  No dysarthria, no aphasia  Skin: Skin is warm and dry. No rash noted. No erythema.  Bruise over abdominal wall left lower quadrant where she has been giving herself Lovenox shots, tender to palpation  Psychiatric: She has a normal mood and affect. Her behavior is normal. Judgment and thought content normal.    LABORATORY PANEL:   CBC Recent Labs  Lab 10/26/18 2154  WBC 16.3*  HGB 13.2  HCT 41.5  PLT 225   ------------------------------------------------------------------------------------------------------------------  Chemistries  Recent Labs  Lab 10/26/18 2154  NA 134*  K 3.4*  CL 92*  CO2 31  GLUCOSE 161*  BUN 28*  CREATININE 1.15*  CALCIUM 8.8*  AST 39  ALT 21  ALKPHOS 109  BILITOT 1.2   ------------------------------------------------------------------------------------------------------------------  Cardiac Enzymes No results for input(s): TROPONINI in the last 168 hours. ------------------------------------------------------------------------------------------------------------------  RADIOLOGY:  Dg Chest Portable 1 View  Result Date: 10/26/2018 CLINICAL DATA:  Shortness of breath and altered mental status EXAM: PORTABLE CHEST 1 VIEW  COMPARISON:  02/28/2018 chest radiograph FINDINGS: Left chest wall pacemaker leads are in unchanged position. Sequelae of median sternotomy and valve replacement. There is shallow lung inflation with mild interstitial opacity. No pneumothorax or sizable pleural effusion. Mild cardiomegaly is unchanged. There is calcific aortic atherosclerosis. IMPRESSION: Shallow lung inflation with chronic interstitial opacities, unchanged. Electronically Signed  By: Ulyses Jarred M.D.   On: 10/26/2018 22:16    EKG:   Orders placed or performed during the hospital encounter of 10/26/18  . ED EKG 12-Lead  . ED EKG 12-Lead  . Repeat EKG  . Repeat EKG    IMPRESSION AND PLAN:  Principal Problem:   Sepsis ([place]) -due to UTI and possible pneumonia.  IV antibiotics in place.  Lactic acid was elevated, IV fluids started and will trend lactate until within normal limits.  Blood pressure on the soft side but stable.  Cultures sent Active Problems:   UTI (urinary tract infection) -IV antibiotics as above, culture sent   PAF (paroxysmal atrial fibrillation) (HCC) -continue home rate controlling medications and anticoagulation   Mechanical mitral valve -patient has been on therapeutic Lovenox shots due to recent procedure, she is now transitioning back to her warfarin.  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 Labs, imaging and/or ECG reviewed by provider and discussed with patient/family. Management plans  discussed with the patient and/or family.  DVT PROPHYLAXIS: SubQ lovenox   GI PROPHYLAXIS:  None  ADMISSION STATUS: Inpatient     CODE STATUS: Full Code Status History    Date Active Date Inactive Code Status Order ID Comments User Context   02/19/2018 1034 03/01/2018 1852 Full Code [id number]  Bettey Costa, MD Inpatient   01/27/2018 0603 02/04/2018 1634 Full Code [id number]  Saundra Shelling, MD ED   07/20/2017 0437 07/20/2017 1940 Full Code [id number]  Harrie Foreman, MD Inpatient   05/25/2017 1951 05/27/2017 1734 Full Code [id number]  Gladstone Lighter, MD Inpatient   03/27/2017 2251 04/09/2017 1549 Full Code [id number]  Lance Coon, MD Inpatient   06/26/2016 0106 06/28/2016 1639 Full Code [id number]  Lance Coon, MD Inpatient   05/10/2016 1638 05/12/2016 1659 Full Code [id number]  Theodoro Grist, MD ED   04/01/2016 1713 04/03/2016 0558 Full Code [id number]  Henreitta Leber, MD Inpatient    Advance Directive Documentation     Most Recent Value  Type of Advance Directive  Healthcare Power of Attorney, Living will  Pre-existing out of facility DNR order (yellow form or pink MOST form)  -  "MOST" Form in Place?  -      TOTAL TIME TAKING CARE OF THIS PATIENT: 45 minutes.   Deaunte Dente FIELDING 10/26/2018, 11:51 PM  [facility]  Office  [phone]  CC: Primary care physician; Dion Body, MD  Note:  This document was prepared using Dragon voice recognition software and may include unintentional dictation errors.

## 2018-10-26 NOTE — ED Triage Notes (Signed)
Patient found by son, lethargic, oral temp 103. HR 104. On 2LNC. C/o abdominal pain.  BP 104/p

## 2018-10-26 NOTE — ED Notes (Signed)
Date and time results received: 10/26/18 2310 (use smartphrase ".now" to insert current time)  Test: Lactic Critical Value: 2.8  Name of Provider Notified: Dr. Cinda Quest  Orders Received? Or Actions Taken?: Provider aware

## 2018-10-26 NOTE — Progress Notes (Signed)
CODE SEPSIS - PHARMACY COMMUNICATION  **[facility] Spectrum Antibiotics should be administered within 1 hour of Sepsis diagnosis**  Time Code Sepsis Called/Page Received: 2148  Antibiotics Ordered: cefepime  Time of 1st antibiotic administration: 2202  Additional action taken by pharmacy:   If necessary, Name of Provider/Nurse Contacted:     Tobie Lords ,PharmD Clinical Pharmacist  10/26/2018  10:35 PM

## 2018-10-27 ENCOUNTER — Inpatient Hospital Stay: Payer: Medicare Other

## 2018-10-27 ENCOUNTER — Other Ambulatory Visit: Payer: Self-pay

## 2018-10-27 DIAGNOSIS — Z7189 Other specified counseling: Secondary | ICD-10-CM

## 2018-10-27 DIAGNOSIS — Z515 Encounter for palliative care: Secondary | ICD-10-CM

## 2018-10-27 DIAGNOSIS — A419 Sepsis, unspecified organism: Secondary | ICD-10-CM

## 2018-10-27 LAB — BLOOD CULTURE ID PANEL (REFLEXED)
Acinetobacter baumannii: NOT DETECTED
CANDIDA GLABRATA: NOT DETECTED
CANDIDA KRUSEI: NOT DETECTED
CARBAPENEM RESISTANCE: NOT DETECTED
Candida albicans: NOT DETECTED
Candida parapsilosis: NOT DETECTED
Candida tropicalis: NOT DETECTED
Enterobacter cloacae complex: NOT DETECTED
Enterobacteriaceae species: DETECTED — AB
Enterococcus species: NOT DETECTED
Escherichia coli: NOT DETECTED
Haemophilus influenzae: NOT DETECTED
KLEBSIELLA OXYTOCA: NOT DETECTED
Klebsiella pneumoniae: DETECTED — AB
Listeria monocytogenes: NOT DETECTED
Neisseria meningitidis: NOT DETECTED
Proteus species: NOT DETECTED
Pseudomonas aeruginosa: NOT DETECTED
SERRATIA MARCESCENS: NOT DETECTED
STAPHYLOCOCCUS AUREUS BCID: NOT DETECTED
STREPTOCOCCUS PYOGENES: NOT DETECTED
Staphylococcus species: NOT DETECTED
Streptococcus agalactiae: NOT DETECTED
Streptococcus pneumoniae: NOT DETECTED
Streptococcus species: NOT DETECTED

## 2018-10-27 LAB — PROTIME-INR
INR: 1.37
INR: 1.5
Prothrombin Time: 16.7 s — ABNORMAL HIGH (ref 11.4–15.2)
Prothrombin Time: 17.9 s — ABNORMAL HIGH (ref 11.4–15.2)

## 2018-10-27 LAB — LACTIC ACID, PLASMA
Lactic Acid, Venous: 2.1 mmol/L (ref 0.5–1.9)
Lactic Acid, Venous: 2.5 mmol/L (ref 0.5–1.9)

## 2018-10-27 LAB — CBC
HCT: 37.2 % (ref 36.0–46.0)
Hemoglobin: 11.7 g/dL — ABNORMAL LOW (ref 12.0–15.0)
MCH: 28.8 pg (ref 26.0–34.0)
MCHC: 31.5 g/dL (ref 30.0–36.0)
MCV: 91.6 fL (ref 80.0–100.0)
Platelets: 188 10*3/uL (ref 150–400)
RBC: 4.06 MIL/uL (ref 3.87–5.11)
RDW: 17 % — ABNORMAL HIGH (ref 11.5–15.5)
WBC: 13.7 10*3/uL — ABNORMAL HIGH (ref 4.0–10.5)
nRBC: 0 % (ref 0.0–0.2)

## 2018-10-27 LAB — BASIC METABOLIC PANEL
Anion gap: 10 (ref 5–15)
BUN: 31 mg/dL — ABNORMAL HIGH (ref 8–23)
CALCIUM: 7.9 mg/dL — AB (ref 8.9–10.3)
CO2: 28 mmol/L (ref 22–32)
CREATININE: 1.12 mg/dL — AB (ref 0.44–1.00)
Chloride: 97 mmol/L — ABNORMAL LOW (ref 98–111)
GFR calc Af Amer: 56 mL/min — ABNORMAL LOW (ref >60)
GFR, EST NON AFRICAN AMERICAN: 48 mL/min — AB (ref >60)
GLUCOSE: 201 mg/dL — AB (ref 70–99)
Potassium: 3.2 mmol/L — ABNORMAL LOW (ref 3.5–5.1)
Sodium: 135 mmol/L (ref 135–145)

## 2018-10-27 LAB — MAGNESIUM: Magnesium: 1.7 mg/dL (ref 1.7–2.4)

## 2018-10-27 LAB — MRSA PCR SCREENING: MRSA by PCR: NEGATIVE

## 2018-10-27 LAB — HEPARIN LEVEL (UNFRACTIONATED)
HEPARIN UNFRACTIONATED: 0.6 [IU]/mL (ref 0.30–0.70)
Heparin Unfractionated: 0.65 [IU]/mL (ref 0.30–0.70)

## 2018-10-27 LAB — GLUCOSE, CAPILLARY
GLUCOSE-CAPILLARY: 137 mg/dL — AB (ref 70–99)
GLUCOSE-CAPILLARY: 96 mg/dL (ref 70–99)
Glucose-Capillary: 130 mg/dL — ABNORMAL HIGH (ref 70–99)

## 2018-10-27 LAB — APTT: aPTT: 39 s — ABNORMAL HIGH (ref 24–36)

## 2018-10-27 MED ORDER — VENLAFAXINE HCL ER 75 MG PO CP24
150.0000 mg | ORAL_CAPSULE | Freq: Every day | ORAL | Status: DC
  Administered 2018-10-27 – 2018-11-03 (×8): 150 mg via ORAL
  Filled 2018-10-27 (×8): qty 2

## 2018-10-27 MED ORDER — SODIUM CHLORIDE 0.9 % IV SOLN
1.0000 g | Freq: Three times a day (TID) | INTRAVENOUS | Status: DC
  Administered 2018-10-27 – 2018-10-29 (×8): 1 g via INTRAVENOUS
  Filled 2018-10-27 (×11): qty 1

## 2018-10-27 MED ORDER — WARFARIN SODIUM 6 MG PO TABS
6.0000 mg | ORAL_TABLET | Freq: Every day | ORAL | Status: DC
  Filled 2018-10-27: qty 1

## 2018-10-27 MED ORDER — AZATHIOPRINE 50 MG PO TABS
100.0000 mg | ORAL_TABLET | Freq: Every day | ORAL | Status: DC
  Administered 2018-10-27: 100 mg via ORAL
  Filled 2018-10-27: qty 2

## 2018-10-27 MED ORDER — HEPARIN (PORCINE) IN NACL 100-0.45 UNIT/ML-% IJ SOLN
1550.0000 [IU]/h | INTRAMUSCULAR | Status: AC
  Administered 2018-10-27 – 2018-10-28 (×3): 1250 [IU]/h via INTRAVENOUS
  Administered 2018-10-29 – 2018-10-30 (×2): 1400 [IU]/h via INTRAVENOUS
  Administered 2018-10-31 – 2018-11-01 (×3): 1550 [IU]/h via INTRAVENOUS
  Filled 2018-10-27 (×13): qty 250

## 2018-10-27 MED ORDER — VANCOMYCIN HCL IN DEXTROSE 1-5 GM/200ML-% IV SOLN
1000.0000 mg | Freq: Two times a day (BID) | INTRAVENOUS | Status: DC
  Administered 2018-10-27: 1000 mg via INTRAVENOUS
  Filled 2018-10-27 (×2): qty 200

## 2018-10-27 MED ORDER — WARFARIN SODIUM 7.5 MG PO TABS
7.5000 mg | ORAL_TABLET | Freq: Once | ORAL | Status: AC
  Administered 2018-10-27: 7.5 mg via ORAL
  Filled 2018-10-27: qty 1

## 2018-10-27 MED ORDER — ATORVASTATIN CALCIUM 20 MG PO TABS
20.0000 mg | ORAL_TABLET | Freq: Every day | ORAL | Status: DC
  Administered 2018-10-27 – 2018-11-02 (×6): 20 mg via ORAL
  Filled 2018-10-27 (×6): qty 1

## 2018-10-27 MED ORDER — MAGNESIUM SULFATE 2 GM/50ML IV SOLN
2.0000 g | Freq: Once | INTRAVENOUS | Status: AC
  Administered 2018-10-27: 2 g via INTRAVENOUS
  Filled 2018-10-27: qty 50

## 2018-10-27 MED ORDER — PROCHLORPERAZINE EDISYLATE 10 MG/2ML IJ SOLN
5.0000 mg | INTRAMUSCULAR | Status: DC | PRN
  Filled 2018-10-27: qty 1

## 2018-10-27 MED ORDER — GABAPENTIN 600 MG PO TABS
600.0000 mg | ORAL_TABLET | Freq: Two times a day (BID) | ORAL | Status: DC
  Administered 2018-10-27 – 2018-11-03 (×14): 600 mg via ORAL
  Filled 2018-10-27 (×15): qty 1

## 2018-10-27 MED ORDER — INSULIN ASPART 100 UNIT/ML ~~LOC~~ SOLN
0.0000 [IU] | Freq: Three times a day (TID) | SUBCUTANEOUS | Status: DC
  Administered 2018-10-27 – 2018-10-28 (×3): 1 [IU] via SUBCUTANEOUS
  Administered 2018-10-28: 3 [IU] via SUBCUTANEOUS
  Administered 2018-10-29: 1 [IU] via SUBCUTANEOUS
  Administered 2018-10-29 – 2018-10-30 (×4): 2 [IU] via SUBCUTANEOUS
  Administered 2018-10-30: 1 [IU] via SUBCUTANEOUS
  Administered 2018-10-31: 5 [IU] via SUBCUTANEOUS
  Administered 2018-10-31: 2 [IU] via SUBCUTANEOUS
  Administered 2018-10-31: 3 [IU] via SUBCUTANEOUS
  Administered 2018-11-01: 5 [IU] via SUBCUTANEOUS
  Administered 2018-11-01: 2 [IU] via SUBCUTANEOUS
  Administered 2018-11-01: 5 [IU] via SUBCUTANEOUS
  Administered 2018-11-02: 3 [IU] via SUBCUTANEOUS
  Administered 2018-11-02: 2 [IU] via SUBCUTANEOUS
  Administered 2018-11-02: 3 [IU] via SUBCUTANEOUS
  Administered 2018-11-03: 2 [IU] via SUBCUTANEOUS
  Administered 2018-11-03: 5 [IU] via SUBCUTANEOUS
  Filled 2018-10-27 (×21): qty 1

## 2018-10-27 MED ORDER — VANCOMYCIN HCL IN DEXTROSE 1-5 GM/200ML-% IV SOLN
1000.0000 mg | Freq: Once | INTRAVENOUS | Status: AC
  Administered 2018-10-27: 1000 mg via INTRAVENOUS
  Filled 2018-10-27: qty 200

## 2018-10-27 MED ORDER — HEPARIN BOLUS VIA INFUSION
4000.0000 [IU] | Freq: Once | INTRAVENOUS | Status: AC
  Administered 2018-10-27: 03:00:00 4000 [IU] via INTRAVENOUS
  Filled 2018-10-27: qty 4000

## 2018-10-27 MED ORDER — GABAPENTIN 300 MG PO CAPS
300.0000 mg | ORAL_CAPSULE | Freq: Every day | ORAL | Status: DC
  Administered 2018-10-28 – 2018-11-03 (×7): 300 mg via ORAL
  Filled 2018-10-27 (×6): qty 1

## 2018-10-27 MED ORDER — INSULIN ASPART 100 UNIT/ML ~~LOC~~ SOLN
0.0000 [IU] | Freq: Every day | SUBCUTANEOUS | Status: DC
  Administered 2018-10-31: 2 [IU] via SUBCUTANEOUS
  Filled 2018-10-27: qty 1

## 2018-10-27 MED ORDER — PANTOPRAZOLE SODIUM 40 MG PO TBEC
40.0000 mg | DELAYED_RELEASE_TABLET | Freq: Every day | ORAL | Status: DC
  Administered 2018-10-27 – 2018-11-03 (×8): 40 mg via ORAL
  Filled 2018-10-27 (×8): qty 1

## 2018-10-27 MED ORDER — MUSCLE RUB 10-15 % EX CREA
TOPICAL_CREAM | CUTANEOUS | Status: DC | PRN
  Administered 2018-10-28 – 2018-10-31 (×5): via TOPICAL
  Filled 2018-10-27: qty 85

## 2018-10-27 MED ORDER — SODIUM CHLORIDE 0.9 % IV SOLN
INTRAVENOUS | Status: AC
  Administered 2018-10-27: 02:00:00 via INTRAVENOUS

## 2018-10-27 MED ORDER — WARFARIN - PHARMACIST DOSING INPATIENT
Freq: Every day | Status: DC
  Administered 2018-10-30 – 2018-11-02 (×2)

## 2018-10-27 MED ORDER — POTASSIUM CHLORIDE CRYS ER 20 MEQ PO TBCR
40.0000 meq | EXTENDED_RELEASE_TABLET | Freq: Once | ORAL | Status: AC
  Administered 2018-10-27: 40 meq via ORAL
  Filled 2018-10-27: qty 2

## 2018-10-27 MED ORDER — WARFARIN SODIUM 3 MG PO TABS
3.0000 mg | ORAL_TABLET | Freq: Every day | ORAL | Status: DC
Start: 2018-10-27 — End: 2018-10-28
  Administered 2018-10-27: 3 mg via ORAL
  Filled 2018-10-27: qty 1

## 2018-10-27 NOTE — Progress Notes (Signed)
Pharmacy Electrolyte Monitoring Consult:  Pharmacy consulted to assist in monitoring and replacing electrolytes in this 72 y.o. female admitted on 10/26/2018 with atrial fibrillation. Patient transferred to ICU on 11/5.   Labs:  Sodium (mmol/L)  Date Value  10/27/2018 135   Potassium (mmol/L)  Date Value  10/27/2018 3.2 (L)  09/27/2014 3.7   Magnesium (mg/dL)  Date Value  10/27/2018 1.7   Phosphorus (mg/dL)  Date Value  03/01/2018 2.4 (L)   Calcium (mg/dL)  Date Value  10/27/2018 7.9 (L)   Albumin (g/dL)  Date Value  10/26/2018 4.1    Assessment/Plan: Glucose: will initiate patient on sensitive scale SSI.   Electrolytes: potassium 32mEq PO x 1 and magnesium 2g IV x 1. Will check BMP/Magnesium with am labs.   Will replace for goal potassium ~4 and goal magnesium ~ 2.   Pharmacy will continue to monitor and adjust per consult.    Mabeline Varas L 10/27/2018 4:48 PM

## 2018-10-27 NOTE — Progress Notes (Signed)
ANTICOAGULATION CONSULT NOTE -  Pharmacy Consult for warfarin Indication: atrial fibrillation/mechanical mitral valve  Allergies  Allergen Reactions  . Flecainide Shortness Of Breath and Other (See Comments)    Reaction: dizziness   . Amiodarone Other (See Comments)    Pt states that this medication causes lung bleeding.      Patient Measurements: Height: 5\' 9"  (175.3 cm) Weight: 216 lb 7.9 oz (98.2 kg) IBW/kg (Calculated) : 66.2 Heparin Dosing Weight: 87.7 kg  Vital Signs: Temp: 100.6 F (38.1 C) (11/05 1600) Temp Source: Oral (11/05 1600) BP: 121/72 (11/05 1600) Pulse Rate: 87 (11/05 1600)  Labs: Recent Labs    10/26/18 2154 10/27/18 0201 10/27/18 0436 10/27/18 0902  HGB 13.2  --  11.7*  --   HCT 41.5  --  37.2  --   PLT 225  --  188  --   APTT  --  39*  --   --   LABPROT 16.6* 16.7* 17.9*  --   INR 1.36 1.37 1.50  --   HEPARINUNFRC  --   --   --  0.65  CREATININE 1.15*  --  1.12*  --     Estimated Creatinine Clearance: 57.5 mL/min (A) (by C-G formula based on SCr of 1.12 mg/dL (H)).   Medical History: Past Medical History:  Diagnosis Date  . Acute diastolic heart failure ([place])   . Allergy   . 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 Asthma   . Atrial fibrillation ([place])   . Backache, unspecified   . Cancer ([place])    skin  . Cardiomegaly   . COPD (chronic obstructive pulmonary disease) ([place])   . Diabetes mellitus without complication ([place])   . 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 Esophageal reflux   . Headache(784.0)   . Herpes zoster without mention of complication   . Hx: UTI (urinary tract infection)   . Hypertension    heart controlled w CHF  . Nontoxic uninodular goiter   . Obesity, unspecified   . Osteoarthrosis, unspecified whether generalized or localized, unspecified site   . Unspecified sleep apnea   . Urine incontinence    hx of    Medications:  Scheduled:  . atorvastatin  20 mg Oral Daily  .  azaTHIOprine  100 mg Oral Daily  . insulin aspart  0-5 Units Subcutaneous QHS  . insulin aspart  0-9 Units Subcutaneous TID WC  . pantoprazole  40 mg Oral Daily  . venlafaxine XR  150 mg Oral Daily  . warfarin  3 mg Oral q1800  . Warfarin - Pharmacist Dosing Inpatient   Does not apply q1800    Assessment: Patient admitted for SOB s/t to pneumonia was called a code sepsis. Patient has as h/o afib (unsure if the patient is currently in afib as EKG still pending), and has a mechanical mitral valve. Patient takes warfarin 7mg  Monday - Friday and warfarin 8mg  Saturday and Sunday. Patient currently being bridged with heparin at 1250 units/hr.   Goal of Therapy:  INR 2.5 - 3.5 Monitor platelets by anticoagulation protocol: Yes   Plan:  Patient received warfarin 7.5mg  at 0234 this am. Patient currently ordered warfarin 6mg  q1800. Will decrease dose on 11/5 to warfarin 3mg  -which will represent a dose of warfarin 10.5mg  on 11/5. Will obtain INR with am labs.  Pharmacy will continue to monitor and adjust per consult.   MLS 10/27/2018

## 2018-10-27 NOTE — Progress Notes (Addendum)
ANTICOAGULATION CONSULT NOTE - Initial Consult  Pharmacy Consult for warfarin/heparin drip for bridging Indication: atrial fibrillation/mechanical mitral valve  Allergies  Allergen Reactions  . Flecainide Shortness Of Breath and Other (See Comments)    Reaction: dizziness   . Amiodarone Other (See Comments)    Pt states that this medication causes lung bleeding.      Patient Measurements: Height: 5\' 9"  (175.3 cm) Weight: 218 lb 7.6 oz (99.1 kg) IBW/kg (Calculated) : 66.2 Heparin Dosing Weight: 87.7 kg  Vital Signs: Temp: 98.6 F (37 C) (11/05 0030) Temp Source: Oral (11/05 0030) BP: 105/68 (11/05 0102) Pulse Rate: 79 (11/05 0102)  Labs: Recent Labs    10/26/18 2154  HGB 13.2  HCT 41.5  PLT 225  LABPROT 16.6*  INR 1.36  CREATININE 1.15*    Estimated Creatinine Clearance: 56.2 mL/min (A) (by C-G formula based on SCr of 1.15 mg/dL (H)).   Medical History: Past Medical History:  Diagnosis Date  . Acute diastolic heart failure ([place])   . Allergy   . 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 Asthma   . Atrial fibrillation ([place])   . Backache, unspecified   . Cancer ([place])    skin  . Cardiomegaly   . COPD (chronic obstructive pulmonary disease) ([place])   . Diabetes mellitus without complication ([place])   . 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 Esophageal reflux   . Headache(784.0)   . Herpes zoster without mention of complication   . Hx: UTI (urinary tract infection)   . Hypertension    heart controlled w CHF  . Nontoxic uninodular goiter   . Obesity, unspecified   . Osteoarthrosis, unspecified whether generalized or localized, unspecified site   . Unspecified sleep apnea   . Urine incontinence    hx of    Medications:  Scheduled:  . heparin  4,000 Units Intravenous Once  . warfarin  7.5 mg Oral Once    Assessment: Patient admitted for SOB s/t to pneumonia was called a code sepsis. Patient has as h/o afib (unsure if the patient  is currently in afib as EKG still pending), and has a mechanical mitral valve. Patient was on warfarin & lovenox (post procedure) PTA (not sure why it wasn't on the previous med rec), but per care everywhere: Warfarin 7 mg Mon through Fri Warfarin 8 mg Sat & Sun  Patient states she takes: 7 mg Sat & Sun, 8 mg Mon through Fri  11/5 INR 1.36 subtherapeutic  Goal of Therapy:  INR 2.5 - 3.5 Monitor platelets by anticoagulation protocol: Yes   Plan:  Heparin: Will bolus w/ 4000 units IV x 1 Will start drip @ 1250 units/hr  Baseline labs drawn, baseline aPTT slightly elevated, but normal. Will draw anti-Xa @ 0900 Will monitor daily CBC's and will adjust per anti-Xa levels  Will continue heparin drip for 2 consecutive therapeutic INRs (2.5 - 3.5) and bridge for at least 5 days.  Warfarin: Will give a dose of warfarin 7.5 mg PO x 1 Will start warfarin 6 mg daily empirically Will monitor daily INRs and will adjust dose per INR trend  Tobie Lords, PharmD, BCPS Clinical Pharmacist 10/27/2018

## 2018-10-27 NOTE — Progress Notes (Signed)
Pharmacy Antibiotic Note  Betty [name] is a 72 y.o. female admitted on 10/26/2018 with sepsis.  Pharmacy has been consulted for vanc/meropenem dosing.  Plan: Will continue vanc 1g IV q12h w/ 6 hour stack  Will draw trough 11/06 @ 1700 prior to 4th dose. Will start meropenem 1g IV q8h  Ke 0.0510 T1/2 ~ 12 hrs Goal trough 15 - 20 mcg/mL  Height: 5\' 9"  (175.3 cm) Weight: 218 lb 7.6 oz (99.1 kg) IBW/kg (Calculated) : 66.2  Temp (24hrs), Avg:101.1 F (38.4 C), Min:98.6 F (37 C), Max:103.6 F (39.8 C)  Recent Labs  Lab 10/26/18 2154 10/27/18 0012  WBC 16.3*  --   CREATININE 1.15*  --   LATICACIDVEN 2.8* 2.5*    Estimated Creatinine Clearance: 56.2 mL/min (A) (by C-G formula based on SCr of 1.15 mg/dL (H)).    Allergies  Allergen Reactions  . Flecainide Shortness Of Breath and Other (See Comments)    Reaction: dizziness   . Amiodarone Other (See Comments)    Pt states that this medication causes lung bleeding.      Thank you for allowing pharmacy to be a part of this patient's care.  Tobie Lords, PharmD, BCPS Clinical Pharmacist 10/27/2018

## 2018-10-27 NOTE — Consult Note (Addendum)
Reason for Consult: Altered Mental Status Referring Physician: Dr. Edgardo Roys Betty [name] is an 72 y.o. female.  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  Per report family states she was lethargic and unable to answer questions coherently.  She presented febrile and was felt to be septic with possible source of urine and pneumonia.  She is presently on immunosuppressive therapy to include Imuran, is on meropenem and vancomycin for this admission along with heparin.  When she arrives into the ICU she will communicate but is confused, stable hemodynamics at this time  Past Medical History:  Diagnosis Date  . Acute diastolic heart failure ([place])   . Allergy   . ANCA-associated vasculitis ([place])   . Asthma   . Atrial fibrillation ([place])   . Backache, unspecified   . Cancer ([place])    skin  . Cardiomegaly   . COPD (chronic obstructive pulmonary disease) ([place])   . Diabetes mellitus without complication ([place])   . 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 Esophageal reflux   . Headache(784.0)   . Herpes zoster without mention of complication   . Hx: UTI (urinary tract infection)   . Hypertension    heart controlled w CHF  . Nontoxic uninodular goiter   . Obesity, unspecified   . Osteoarthrosis, unspecified whether generalized or localized, unspecified site   . Unspecified sleep apnea   . Urine incontinence    hx of    Past Surgical History:  Procedure Laterality Date  . ABDOMINAL HYSTERECTOMY  1979   complete (for precancerous cells)  . ABLATION  2011 & 2014  . bladder botox  01/26/2018  . CHOLECYSTECTOMY    . CYSTOSCOPY WITH FULGERATION  N/A 01/28/2018   Procedure: [place] AND CLOT EVACUATION;  Surgeon: Hollice Espy, MD;  Location: [facility] ORS;  Service: Urology;  Laterality: N/A;  . Interstim Placement  2012  . OOPHORECTOMY      Family History  Problem Relation Age of Onset  . Heart attack Father   . Heart failure Father   . Arthritis Father   . Stroke Father   . Hypertension Father   . Coronary artery disease Brother   . Peripheral vascular disease Brother   . Arthritis Mother   . Colon cancer Mother        colon cancer  . Hypertension Mother   . Cancer Maternal Grandmother        colon cancer  . Arthritis Maternal Grandmother     Social History:  reports that she has quit smoking. Her smoking use included cigarettes. She has a 7.50 pack-year smoking history. She has never used smokeless tobacco. She reports that she does not drink alcohol or use drugs.  Allergies:  Allergies  Allergen Reactions  . Flecainide Shortness Of Breath and Other (See Comments)    Reaction: dizziness   . Amiodarone Other (See Comments)    Pt states that this medication causes lung bleeding.      Medications: I have reviewed the patient's current medications.  Results for orders placed or performed during the hospital encounter of 10/26/18 (from the past 48 hour(s))  Comprehensive metabolic panel     Status: Abnormal   Collection  Time: 10/26/18  9:54 PM  Result Value Ref Range   Sodium 134 (L) 135 - 145 mmol/L   Potassium 3.4 (L) 3.5 - 5.1 mmol/L   Chloride 92 (L) 98 - 111 mmol/L   CO2 31 22 - 32 mmol/L   Glucose, Bld 161 (H) 70 - 99 mg/dL   BUN 28 (H) 8 - 23 mg/dL   Creatinine, Ser 1.15 (H) 0.44 - 1.00 mg/dL   Calcium 8.8 (L) 8.9 - 10.3 mg/dL   Total Protein 7.4 6.5 - 8.1 g/dL   Albumin 4.1 3.5 - 5.0 g/dL   AST 39 15 - 41 U/L   ALT 21 0 - 44 U/L   Alkaline Phosphatase 109 38 - 126 U/L   Total Bilirubin 1.2 0.3 - 1.2 mg/dL   GFR calc non Af Amer 47 (L) >60 mL/min   GFR calc Af Amer 54 (L) >60 mL/min     Comment: (NOTE) The eGFR has been calculated using the CKD EPI equation. This calculation has not been validated in all clinical situations. eGFR's persistently <60 mL/min signify possible Chronic Kidney Disease.    Anion gap 11 5 - 15    Comment: Performed at [facility], [address] [place], [place] [address]  CBC WITH DIFFERENTIAL     Status: Abnormal   Collection Time: 10/26/18  9:54 PM  Result Value Ref Range   WBC 16.3 (H) 4.0 - 10.5 K/uL   RBC 4.51 3.87 - 5.11 MIL/uL   Hemoglobin 13.2 12.0 - 15.0 g/dL   HCT 41.5 36.0 - 46.0 %   MCV 92.0 80.0 - 100.0 fL   MCH 29.3 26.0 - 34.0 pg   MCHC 31.8 30.0 - 36.0 g/dL   RDW 17.0 (H) 11.5 - 15.5 %   Platelets 225 150 - 400 K/uL   nRBC 0.0 0.0 - 0.2 %   Neutrophils Relative % 93 %   Neutro Abs 15.2 (H) 1.7 - 7.7 K/uL   Lymphocytes Relative 1 %   Lymphs Abs 0.1 (L) 0.7 - 4.0 K/uL   Monocytes Relative 4 %   Monocytes Absolute 0.7 0.1 - 1.0 K/uL   Eosinophils Relative 1 %   Eosinophils Absolute 0.1 0.0 - 0.5 K/uL   Basophils Relative 0 %   Basophils Absolute 0.1 0.0 - 0.1 K/uL   Immature Granulocytes 1 %   Abs Immature Granulocytes 0.16 (H) 0.00 - 0.07 K/uL    Comment: Performed at [facility], [address] [place], [place] [address]  Blood Culture (routine x 2)     Status: None (Preliminary result)   Collection Time: 10/26/18  9:54 PM  Result Value Ref Range   Specimen Description BLOOD BLOOD RIGHT WRIST    Special Requests      BOTTLES DRAWN AEROBIC AND ANAEROBIC Blood Culture adequate volume   Culture  Setup Time      GRAM NEGATIVE RODS ANAEROBIC BOTTLE ONLY CRITICAL VALUE NOTED.  VALUE IS CONSISTENT WITH PREVIOUSLY REPORTED AND CALLED VALUE. Performed at [facility], [address] [place], [place] [address]    Culture GRAM NEGATIVE RODS    Report Status PENDING   Blood Culture (routine x 2)     Status: None (Preliminary result)   Collection Time: 10/26/18  9:54 PM  Result Value  Ref Range   Specimen Description BLOOD BLOOD RIGHT HAND    Special Requests      BOTTLES DRAWN AEROBIC AND ANAEROBIC Blood Culture results may not be optimal due  to an excessive volume of blood received in culture bottles   Culture  Setup Time      Organism ID to follow GRAM NEGATIVE RODS IN BOTH AEROBIC AND ANAEROBIC BOTTLES CRITICAL RESULT CALLED TO, READ BACK BY AND VERIFIED WITH: HANK ZOMPA AT 9983 ON 10/27/18 [place]. Performed at [facility], [address] [place], [place] [address]    Culture GRAM NEGATIVE RODS    Report Status PENDING   Urinalysis, Complete w Microscopic     Status: Abnormal   Collection Time: 10/26/18  9:54 PM  Result Value Ref Range   Color, Urine STRAW (A) YELLOW   APPearance CLEAR (A) CLEAR   Specific Gravity, Urine 1.006 1.005 - 1.030   pH 6.0 5.0 - 8.0   Glucose, UA NEGATIVE NEGATIVE mg/dL   Hgb urine dipstick SMALL (A) NEGATIVE   Bilirubin Urine NEGATIVE NEGATIVE   Ketones, ur NEGATIVE NEGATIVE mg/dL   Protein, ur NEGATIVE NEGATIVE mg/dL   Nitrite NEGATIVE NEGATIVE   Leukocytes, UA SMALL (A) NEGATIVE   RBC / HPF 6-10 0 - 5 RBC/hpf   WBC, UA 21-50 0 - 5 WBC/hpf   Bacteria, UA NONE SEEN NONE SEEN   Squamous Epithelial / LPF 0-5 0 - 5   Mucus PRESENT     Comment: Performed at [facility], [address] [place], [place] [address]  Protime-INR     Status: Abnormal   Collection Time: 10/26/18  9:54 PM  Result Value Ref Range   Prothrombin Time 16.6 (H) 11.4 - 15.2 seconds   INR 1.36     Comment: Performed at [facility], [place], [place] [address]  Lactic acid, plasma     Status: Abnormal   Collection Time: 10/26/18  9:54 PM  Result Value Ref Range   Lactic Acid, Venous 2.8 (HH) 0.5 - 1.9 mmol/L    Comment: CRITICAL RESULT CALLED TO, READ BACK BY AND VERIFIED WITH KASSIE VANHOUSEN _0  10/26/18 [facility] Performed at [facility], [address] [place],  [address]   Blood Culture ID  Panel (Reflexed)     Status: Abnormal   Collection Time: 10/26/18  9:54 PM  Result Value Ref Range   Enterococcus species NOT DETECTED NOT DETECTED   Listeria monocytogenes NOT DETECTED NOT DETECTED   Staphylococcus species NOT DETECTED NOT DETECTED   Staphylococcus aureus (BCID) NOT DETECTED NOT DETECTED   Streptococcus species NOT DETECTED NOT DETECTED   Streptococcus agalactiae NOT DETECTED NOT DETECTED   Streptococcus pneumoniae NOT DETECTED NOT DETECTED   Streptococcus pyogenes NOT DETECTED NOT DETECTED   Acinetobacter baumannii NOT DETECTED NOT DETECTED   Enterobacteriaceae species DETECTED (A) NOT DETECTED    Comment: Enterobacteriaceae represent a large family of gram-negative bacteria, not a single organism. CRITICAL RESULT CALLED TO, READ BACK BY AND VERIFIED WITH: HANK ZOMPA AT 4097 ON 10/27/18 [place].    Enterobacter cloacae complex NOT DETECTED NOT DETECTED   Escherichia coli NOT DETECTED NOT DETECTED   Klebsiella oxytoca NOT DETECTED NOT DETECTED   Klebsiella pneumoniae DETECTED (A) NOT DETECTED    Comment: HANK ZOMPA AT 3532 ON 10/27/18 [place].   Proteus species NOT DETECTED NOT DETECTED   Serratia marcescens NOT DETECTED NOT DETECTED   Carbapenem resistance NOT DETECTED NOT DETECTED   Haemophilus influenzae NOT DETECTED NOT DETECTED   Neisseria meningitidis NOT DETECTED NOT DETECTED   Pseudomonas aeruginosa NOT DETECTED NOT DETECTED   Candida albicans NOT DETECTED NOT DETECTED   Candida glabrata NOT DETECTED  NOT DETECTED   Candida krusei NOT DETECTED NOT DETECTED   Candida parapsilosis NOT DETECTED NOT DETECTED   Candida tropicalis NOT DETECTED NOT DETECTED    Comment: Performed at [facility], [address] [place], [place] [address]  Lactic acid, plasma     Status: Abnormal   Collection Time: 10/27/18 12:12 AM  Result Value Ref Range   Lactic Acid, Venous 2.5 (HH) 0.5 - 1.9 mmol/L    Comment: CRITICAL RESULT CALLED TO, READ BACK BY AND VERIFIED  WITH KASSIE VANHOUSEN _0  10/27/18 [facility] Performed at [facility], [address], [place], [place] [address]   Protime-INR     Status: Abnormal   Collection Time: 10/27/18  2:01 AM  Result Value Ref Range   Prothrombin Time 16.7 (H) 11.4 - 15.2 seconds   INR 1.37     Comment: Performed at [facility], [address] [place], [place] [address]  APTT     Status: Abnormal   Collection Time: 10/27/18  2:01 AM  Result Value Ref Range   aPTT 39 (H) 24 - 36 seconds    Comment:        IF BASELINE aPTT IS ELEVATED, SUGGEST PATIENT RISK ASSESSMENT BE USED TO DETERMINE APPROPRIATE ANTICOAGULANT THERAPY. Performed at [facility], [address] [place], [place] [address]   Protime-INR     Status: Abnormal   Collection Time: 10/27/18  4:36 AM  Result Value Ref Range   Prothrombin Time 17.9 (H) 11.4 - 15.2 seconds   INR 1.50     Comment: Performed at [facility], [address] [place], [place] [address]  CBC     Status: Abnormal   Collection Time: 10/27/18  4:36 AM  Result Value Ref Range   WBC 13.7 (H) 4.0 - 10.5 K/uL   RBC 4.06 3.87 - 5.11 MIL/uL   Hemoglobin 11.7 (L) 12.0 - 15.0 g/dL   HCT 37.2 36.0 - 46.0 %   MCV 91.6 80.0 - 100.0 fL   MCH 28.8 26.0 - 34.0 pg   MCHC 31.5 30.0 - 36.0 g/dL   RDW 17.0 (H) 11.5 - 15.5 %   Platelets 188 150 - 400 K/uL   nRBC 0.0 0.0 - 0.2 %    Comment: Performed at [facility], [address] [place], [place] [address]  Basic metabolic panel     Status: Abnormal   Collection Time: 10/27/18  4:36 AM  Result Value Ref Range   Sodium 135 135 - 145 mmol/L   Potassium 3.2 (L) 3.5 - 5.1 mmol/L   Chloride 97 (L) 98 - 111 mmol/L   CO2 28 22 - 32 mmol/L   Glucose, Bld 201 (H) 70 - 99 mg/dL   BUN 31 (H) 8 - 23 mg/dL   Creatinine, Ser 1.12 (H) 0.44 - 1.00 mg/dL   Calcium 7.9 (L) 8.9 - 10.3 mg/dL   GFR calc non Af Amer 48 (L) >60 mL/min   GFR calc Af Amer 56 (L) >60 mL/min    Comment: (NOTE) The  eGFR has been calculated using the CKD EPI equation. This calculation has not been validated in all clinical situations. eGFR's persistently <60 mL/min signify possible Chronic Kidney Disease.    Anion gap 10 5 - 15    Comment: Performed at [facility], [place], [place] [address]  Lactic acid, plasma     Status: Abnormal   Collection Time: 10/27/18  4:37 AM  Result Value Ref  Range   Lactic Acid, Venous 2.1 (HH) 0.5 - 1.9 mmol/L    Comment: CRITICAL RESULT CALLED TO, READ BACK BY AND VERIFIED WITH JANNE MUCESI 10/27/18 0516 [facility] Performed at [facility], [address] [place], [place] [address]   Heparin level (unfractionated)     Status: None   Collection Time: 10/27/18  9:02 AM  Result Value Ref Range   Heparin Unfractionated 0.65 0.30 - 0.70 IU/mL    Comment: (NOTE) If heparin results are below expected values, and patient dosage has  been confirmed, suggest follow up testing of antithrombin III levels. Performed at [facility], [address] [place], [place] [address]     Dg Chest Portable 1 View  Result Date: 10/26/2018 CLINICAL DATA:  Shortness of breath and altered mental status EXAM: PORTABLE CHEST 1 VIEW COMPARISON:  02/28/2018 chest radiograph FINDINGS: Left chest wall pacemaker leads are in unchanged position. Sequelae of median sternotomy and valve replacement. There is shallow lung inflation with mild interstitial opacity. No pneumothorax or sizable pleural effusion. Mild cardiomegaly is unchanged. There is calcific aortic atherosclerosis. IMPRESSION: Shallow lung inflation with chronic interstitial opacities, unchanged. Electronically Signed   By: Ulyses Jarred M.D.   On: 10/26/2018 22:16    ROS  Unable to obtain review of systems as patient is confused.  Please see HPI  Blood pressure 106/63, pulse 80, temperature (!) 97.5 F (36.4 C), temperature source Oral, resp. rate 20, height _0  (1.753 m), weight 97.5 kg,  SpO2 94 %.   Physical Exam  Vital signs: Please see the above listed vital signs HEENT: Trachea midline, no oral lesions appreciated.  Patient does have slow rolling eye movements versus horizontal nystagmus, is awake but confused, no oral lesions noted Cardiovascular: Regular rate and rhythm Pulmonary: Clear to auscultation Abdominal: Positive bowel sounds, soft exam Extremities: No clubbing cyanosis or edema noted Neurologic: Patient moves all extremities, no clear deficits  Assessment/Plan:  Sepsis.  Patient is on immunosuppressive's with underlying history of vasculitis, agree with meropenem and vancomycin.  Blood cultures already positive for Klebsiella pneumonia, possible source urine and lung.  Patient with elevated lactic acid.  History of mechanical mitral valve.  Will need to be on anticoagulation.  Last INR was 1.5  Hypokalemia.  Will replace  Renal insufficiency.  Will follow closely  Leukocytosis.  Secondary to sepsis  Altered mental status.  Patient has horizontal rolling eye movements, she has been on venlafaxine, unlikely to be seizures as she is awake and talking, with her altered mental status and her being on anticoagulation will obtain CT scan of head and ask neurology's assistance will empirically obtain EEG  Hermelinda Dellen, DO  Betty [name] 10/27/2018, 10:57 AM

## 2018-10-27 NOTE — [place] Notes (Signed)
ANTICOAGULATION CONSULT NOTE -  Pharmacy Consult for warfarin/heparin drip for bridging Indication: atrial fibrillation/mechanical mitral valve  Allergies  Allergen Reactions  . Flecainide Shortness Of Breath and Other (See Comments)    Reaction: dizziness   . Amiodarone Other (See Comments)    Pt states that this medication causes lung bleeding.      Patient Measurements: Height: 5\' 9"  (175.3 cm) Weight: 214 lb 14.4 oz (97.5 kg) IBW/kg (Calculated) : 66.2 Heparin Dosing Weight: 87.7 kg  Vital Signs: Temp: 97.5 F (36.4 C) (11/05 0450) Temp Source: Oral (11/05 0450) BP: 106/63 (11/05 0450) Pulse Rate: 80 (11/05 0450)  Labs: Recent Labs    10/26/18 2154 10/27/18 0201 10/27/18 0436  HGB 13.2  --  11.7*  HCT 41.5  --  37.2  PLT 225  --  188  APTT  --  39*  --   LABPROT 16.6* 16.7* 17.9*  INR 1.36 1.37 1.50  CREATININE 1.15*  --  1.12*    Estimated Creatinine Clearance: 57.2 mL/min (A) (by C-G formula based on SCr of 1.12 mg/dL (H)).   Medical History: Past Medical History:  Diagnosis Date  . Acute diastolic heart failure ([place])   . Allergy   . ANCA-associated vasculitis ([place])   . Asthma   . Atrial fibrillation ([place])   . Backache, unspecified   . Cancer ([place])    skin  . Cardiomegaly   . COPD (chronic obstructive pulmonary disease) ([place])   . Diabetes mellitus without complication ([place])   . 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 Esophageal reflux   . Headache(784.0)   . Herpes zoster without mention of complication   . Hx: UTI (urinary tract infection)   . Hypertension    heart controlled w CHF  . Nontoxic uninodular goiter   . Obesity, unspecified   . Osteoarthrosis, unspecified whether generalized or localized, unspecified site   . Unspecified sleep apnea   . Urine incontinence    hx of    Medications:  Scheduled:  . atorvastatin  20 mg Oral Daily  . azaTHIOprine  100 mg Oral Daily  . pantoprazole  40 mg Oral Daily   . venlafaxine XR  150 mg Oral Daily  . warfarin  6 mg Oral q1800    Assessment: Patient admitted for SOB s/t to pneumonia was called a code sepsis. Patient has as h/o afib (unsure if the patient is currently in afib as EKG still pending), and has a mechanical mitral valve. Patient was on warfarin & lovenox (post procedure) PTA (not sure why it wasn't on the previous med rec), but per care everywhere: Warfarin 7 mg Mon through Fri Warfarin 8 mg Sat & Sun  Patient states she takes: 7 mg Sat & Sun, 8 mg Mon through Fri  11/5 INR@0201 = 1.36 subtherapeutic, Warfarin 7.5 x1 @ 0234         INR @0436 =1.50    11/6    Goal of Therapy:  INR 2.5 - 3.5 Monitor platelets by anticoagulation protocol: Yes   Plan:  Heparin: Will bolus w/ 4000 units IV x 1 Will start drip @ 1250 units/hr  Baseline labs drawn, baseline aPTT slightly elevated, but normal. Will draw anti-Xa @ 0900 Will monitor daily CBC's and will adjust per anti-Xa levels  Will continue heparin drip for 2 consecutive therapeutic INRs (2.5 - 3.5) and bridge for at least 5 days.  Warfarin: Will continue warfarin 6 mg daily as currently ordered. Patient on abx.  Will monitor daily INRs and will adjust dose per INR trend  Chinita Greenland PharmD Clinical Pharmacist 10/27/2018

## 2018-10-27 NOTE — [place] Notes (Signed)
[place] [facility] [place] NAME: Betty [name]    MR#:  [id number]  DATE OF BIRTH:  [birth year]  SUBJECTIVE:  CHIEF COMPLAINT:   Chief Complaint  Patient presents with  . Code Sepsis  . Fatigue  confused. Having some body jerks REVIEW OF SYSTEMS:  Review of Systems  Unable to perform ROS: Mental acuity   DRUG ALLERGIES:   Allergies  Allergen Reactions  . Flecainide Shortness Of Breath and Other (See Comments)    Reaction: dizziness   . Amiodarone Other (See Comments)    Pt states that this medication causes lung bleeding.     VITALS:  Blood pressure 106/63, pulse 80, temperature (!) 97.5 F (36.4 C), temperature source Oral, resp. rate 20, height 5\' 9"  (1.753 m), weight 97.5 kg, SpO2 94 %. PHYSICAL EXAMINATION:  Physical Exam  HENT:  Head: Normocephalic and atraumatic.  Eyes: Pupils are equal, round, and reactive to light. Conjunctivae and EOM are normal.  Neck: Normal range of motion. Neck supple. No tracheal deviation present. No thyromegaly present.  Cardiovascular: Normal rate, regular rhythm and normal heart sounds.  Pulmonary/Chest: Effort normal and breath sounds normal. No respiratory distress. She has no wheezes. She exhibits no tenderness.  Abdominal: Soft. Bowel sounds are normal. She exhibits no distension. There is no tenderness.  Musculoskeletal: Normal range of motion.  Neurological: She is alert. She is disoriented. No cranial nerve deficit.  Skin: Skin is warm and dry. No rash noted.  Psychiatric: Her affect is inappropriate.   LABORATORY PANEL:  Female CBC Recent Labs  Lab 10/27/18 0436  WBC 13.7*  HGB 11.7*  HCT 37.2  PLT 188   ------------------------------------------------------------------------------------------------------------------ Chemistries  Recent Labs  Lab 10/26/18 2154 10/27/18 0436  NA 134* 135  K 3.4* 3.2*  CL 92* 97*  CO2 31 28  GLUCOSE 161* 201*  BUN 28* 31*  CREATININE 1.15*  1.12*  CALCIUM 8.8* 7.9*  AST 39  --   ALT 21  --   ALKPHOS 109  --   BILITOT 1.2  --    RADIOLOGY:  Dg Chest Portable 1 View  Result Date: 10/26/2018 CLINICAL DATA:  Shortness of breath and altered mental status EXAM: PORTABLE CHEST 1 VIEW COMPARISON:  02/28/2018 chest radiograph FINDINGS: Left chest wall pacemaker leads are in unchanged position. Sequelae of median sternotomy and valve replacement. There is shallow lung inflation with mild interstitial opacity. No pneumothorax or sizable pleural effusion. Mild cardiomegaly is unchanged. There is calcific aortic atherosclerosis. IMPRESSION: Shallow lung inflation with chronic interstitial opacities, unchanged. Electronically Signed   By: Ulyses Jarred M.D.   On: 10/26/2018 22:16   ASSESSMENT AND PLAN:  53  y f admitted for sepsis due to UTI  * Sepsis ([place]) -present on admission due to UTI and possible pneumonia - continue meropenem and vanco - Febrile, leukocytosis, hypotensive, elevated lactate although trending down, confused  * UTI (urinary tract infection) -IV antibiotics as above, culture sent  * PAF (paroxysmal atrial fibrillation) ([place]) -continue home rate controlling medications and anticoagulation  * Mechanical mitral valve -patient has been on therapeutic Lovenox shots due to recent procedure, she is now transitioning back to her warfarin.  Continue therapeutic Lovenox dose for now until her INR is therapeutic - Pharmacy to monitor  * Hypokalemia - replete and recheck  * Controlled type 2 diabetes mellitus without complication (HCC) -sliding scale insulin with corresponding glucose checks  * h/o hypertension -hold antihypertensives for now as the  patient's blood pressure is on the borderline low side for, IV fluids as above for blood pressure support  * COPD (chronic obstructive pulmonary disease) (HCC) -home dose inhalers   * ANCA-associated vasculitis (HCC) -continue home meds   If ICU/step-down bed available - I  would like to transfer this patient as she looks critically sick and high risk for clinical decompensation and multiorgan failure from her sepsis and acute cardio-resp failure as well.  Will get Palliative care c/s   All the records are reviewed and case discussed with Care Management/Social Worker. Management plans discussed with the patient, nursing and they are in agreement.  CODE STATUS: Full Code  TOTAL TIME (Critical Care) TAKING CARE OF THIS PATIENT: 35 minutes.   More than 50% of the time was spent in counseling/coordination of care: YES  POSSIBLE D/C IN 3-4 DAYS, DEPENDING ON CLINICAL CONDITION.   Max Sane M.D on 10/27/2018 at 8:04 AM  Between 7am to 6pm - Pager - [phone]  After 6pm go to [email or web address] - Proofreader  [facility]  Office  [phone]  CC: Primary care physician; Dion Body, MD  Note: This dictation was prepared with Dragon dictation along with smaller phrase technology. Any transcriptional errors that result from this process are unintentional.

## 2018-10-27 NOTE — Consult Note (Signed)
Consultation Note Date: 10/27/2018   Patient Name: Betty [name]  DOB: [birth year]  MRN: [id number]  Age / Sex: 72 y.o., female  PCP: Betty Body, MD Referring Physician: Max Sane, MD  Reason for Consultation: Establishing goals of care  HPI/Patient Profile: 72 y.o. female  with past medical history of afib, ANCA associated vasculitis, COPD, DM, pulmonary alveolar hemorrhage, HTN, skin cancer, OA, mechanical mitral valve on Lovenox, and frequent UTIs admitted on 10/26/2018 with temperature of 103, lethargy, and hypoxia. She was diagnosed with a UTI and sepsis. Also with possible pneumonia. She is presently on immunosuppressive therapy d/t history of ANCA associated vasculitis. Patient is twitching and has horizontal rolling eye movements. PMT consulted by Dr. Manuella [name] for goals of care.  Clinical Assessment and Goals of Care: I have reviewed medical records including EPIC notes, labs and imaging, received report from RN, assessed the patient and then met with the patient to discuss diagnosis prognosis, GOC, EOL wishes, disposition and options.  During my assessment, patient is twitching and has horizontal rolling eye movements; but is able to participate in discussion. She is oriented to time and place. Also tells me about the situation - aware she was a UTI and tells me about history of vasculitis.  I introduced Palliative Medicine as specialized medical care for people living with serious illness. It focuses on providing relief from the symptoms and stress of a serious illness. The goal is to improve quality of life for both the patient and the family.  As far as functional and nutritional status, she tells me she was doing "fine". Per her report, she ambulates well, has a good appetite, and has been eating well.    We discussed her current illness and what it means in the larger context of her on-going co-morbidities. She tells me she has never  been this sick and is "sure" she will get better.   I attempted to elicit values and goals of care important to the patient.    Advance directives, concepts specific to code status, artifical feeding and hydration, and rehospitalization were considered and discussed. When I addressed surrogate decision maker, she tells me "I wont need one" indicating and the intends to get better and maintain ability to make decisions for herself. We discussed extreme situations and explained this was "just in case". She tells me she would want her son to be her decision maker. She has HCPOA documentation and a living will. I brought up code status and she indicates she wants to remain a full code.   She shares her fears and ask that I remain with her for a time for comfort. She shared about her life during this time.   Questions and concerns were addressed. The patient was encouraged to call with questions or concerns.   Primary Decision Maker PATIENT  Son Dellis [name] is patient is unable  SUMMARY OF RECOMMENDATIONS   - initial palliative care discussion - patient to remain full code/full scope at this time - PMT will continue to follow patient; if she declines, will involve son in discussions  Code Status/Advance Care Planning:  Full code   Symptom Management:   Per primary  Palliative Prophylaxis:   Aspiration, Delirium Protocol, Frequent Pain Assessment and Turn Reposition  Additional Recommendations (Limitations, Scope, Preferences):  Full Scope Treatment  Psycho-social/Spiritual:   Desire for further Chaplaincy support:no  Prognosis:   Unable to determine  Discharge Planning: To Be Determined      Primary Diagnoses: Present on Admission: .  Chronic diastolic CHF (congestive heart failure) ([place]) . COPD (chronic obstructive pulmonary disease) ([place]) . Essential (primary) hypertension . PAF (paroxysmal atrial fibrillation) ([place]) . UTI (urinary tract infection) . Sepsis ([place])   I  have reviewed the medical record, interviewed the patient and family, and examined the patient. The following aspects are pertinent.  Past Medical History:  Diagnosis Date  . Acute diastolic heart failure ([place])   . Allergy   . ANCA-associated vasculitis ([place])   . Asthma   . Atrial fibrillation ([place])   . Backache, unspecified   . Cancer ([place])    skin  . Cardiomegaly   . COPD (chronic obstructive pulmonary disease) ([place])   . Diabetes mellitus without complication ([place])   . 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 Esophageal reflux   . Headache(784.0)   . Herpes zoster without mention of complication   . Hx: UTI (urinary tract infection)   . Hypertension    heart controlled w CHF  . Nontoxic uninodular goiter   . Obesity, unspecified   . Osteoarthrosis, unspecified whether generalized or localized, unspecified site   . Unspecified sleep apnea   . Urine incontinence    hx of   Social History   Socioeconomic History  . Marital status: Widowed    Spouse name: Not on file  . Number of children: 1  . Years of education: Not on file  . Highest education level: Not on file  Occupational History    Employer: [employer]  Social Needs  . Financial resource strain: Not on file  . Food insecurity:    Worry: Patient refused    Inability: Patient refused  . Transportation needs:    Medical: Patient refused    Non-medical: Patient refused  Tobacco Use  . Smoking status: Former Smoker    Packs/day: 0.50    Years: 15.00    Pack years: 7.50    Types: Cigarettes  . Smokeless tobacco: Never Used  . Tobacco comment: Has a 20-pack-year history, qutting in 1970.   Substance and Sexual Activity  . Alcohol use: No    Alcohol/week: 0.0 standard drinks  . Drug use: No  . Sexual activity: Not Currently  Lifestyle  . Physical activity:    Days per week: Patient refused    Minutes per session: Patient refused  . Stress: Not on file    Relationships  . Social connections:    Talks on phone: Patient refused    Gets together: Patient refused    Attends religious service: Patient refused    Active member of club or organization: Patient refused    Attends meetings of clubs or organizations: Patient refused    Relationship status: Patient refused  Other Topics Concern  . Not on file  Social History Narrative   Lives in [place] with her husband. 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 Does not routinely exercise.    Family History  Problem Relation Age of Onset  . Heart attack Father   . Heart failure Father   . Arthritis Father   . Stroke Father   . Hypertension Father   . Coronary artery disease Brother   . Peripheral vascular disease Brother   . Arthritis Mother   . Colon cancer Mother        colon cancer  . Hypertension Mother   . Cancer Maternal Grandmother        colon cancer  .  Arthritis Maternal Grandmother    Scheduled Meds: . atorvastatin  20 mg Oral Daily  . azaTHIOprine  100 mg Oral Daily  . insulin aspart  0-5 Units Subcutaneous QHS  . insulin aspart  0-9 Units Subcutaneous TID WC  . pantoprazole  40 mg Oral Daily  . venlafaxine XR  150 mg Oral Daily  . warfarin  6 mg Oral q1800   Continuous Infusions: . heparin 1,250 Units/hr (10/27/18 1100)  . magnesium sulfate 1 - 4 g bolus IVPB    . meropenem (MERREM) IV 1 g (10/27/18 0239)   PRN Meds:.acetaminophen **OR** acetaminophen, prochlorperazine Allergies  Allergen Reactions  . Flecainide Shortness Of Breath and Other (See Comments)    Reaction: dizziness   . Amiodarone Other (See Comments)    Pt states that this medication causes lung bleeding.     Review of Systems  Constitutional: Positive for activity change and fatigue.  All other systems reviewed and are negative.   Physical Exam  Constitutional: She is oriented to person, place, and time. No distress.  HENT:  Head: Normocephalic and  atraumatic.  Cardiovascular: Normal rate. An irregular rhythm present.  Pulmonary/Chest: Effort normal and breath sounds normal.  Abdominal: Soft.  Neurological: She is alert and oriented to person, place, and time.  Horizontal rolling eye movements, twitching  Skin: Skin is warm and dry. She is not diaphoretic.    Vital Signs: BP (!) 107/59 (BP Location: Left Arm)   Pulse 87   Temp 100.2 F (37.9 C) (Oral)   Resp (!) 21   Ht '5\' 9"'$  (1.753 m)   Wt 98.2 kg   SpO2 96%   BMI 31.97 kg/m  Pain Scale: 0-10 POSS *See Group Information*: S-Acceptable,Sleep, easy to arouse Pain Score: 0-No pain   SpO2: SpO2: 96 % O2 Device:SpO2: 96 % O2 Flow Rate: .O2 Flow Rate (L/min): 3 L/min  IO: Intake/output summary:   Intake/Output Summary (Last 24 hours) at 10/27/2018 1206 Last data filed at 10/27/2018 1100 Gross per 24 hour  Intake 1106.49 ml  Output 1300 ml  Net -193.51 ml    LBM: Last BM Date: 10/27/18 Baseline Weight: Weight: 99.1 kg Most recent weight: Weight: 98.2 kg     Palliative Assessment/Data: PPS 60%     Time Total: 50 minutes Greater than 50%  of this time was spent counseling and coordinating care related to the above assessment and plan.  Juel Burrow, DNP, AGNP-C Palliative Medicine Team [phone] Pager: [phone]

## 2018-10-27 NOTE — Progress Notes (Signed)
Dr. Jefferson Fuel called and discussed that he would like to discontinue pt's Imuran for now.  Will discontinue order per Dr. Council Mechanic order.   Darel Hong, AGACNP-BC [facility] Pager: [phone] Cell: [phone]

## 2018-10-27 NOTE — Progress Notes (Signed)
ANTICOAGULATION CONSULT NOTE -  Pharmacy Consult for warfarin/heparin drip for bridging Indication: atrial fibrillation/mechanical mitral valve  Allergies  Allergen Reactions  . Flecainide Shortness Of Breath and Other (See Comments)    Reaction: dizziness   . Amiodarone Other (See Comments)    Pt states that this medication causes lung bleeding.      Patient Measurements: Height: 5\' 9"  (175.3 cm) Weight: 214 lb 14.4 oz (97.5 kg) IBW/kg (Calculated) : 66.2 Heparin Dosing Weight: 87.7 kg  Vital Signs: Temp: 97.5 F (36.4 C) (11/05 0450) Temp Source: Oral (11/05 0450) BP: 106/63 (11/05 0450) Pulse Rate: 80 (11/05 0450)  Labs: Recent Labs    10/26/18 2154 10/27/18 0201 10/27/18 0436 10/27/18 0902  HGB 13.2  --  11.7*  --   HCT 41.5  --  37.2  --   PLT 225  --  188  --   APTT  --  39*  --   --   LABPROT 16.6* 16.7* 17.9*  --   INR 1.36 1.37 1.50  --   HEPARINUNFRC  --   --   --  0.65  CREATININE 1.15*  --  1.12*  --     Estimated Creatinine Clearance: 57.2 mL/min (A) (by C-G formula based on SCr of 1.12 mg/dL (H)).   Medical History: Past Medical History:  Diagnosis Date  . Acute diastolic heart failure ([place])   . Allergy   . ANCA-associated vasculitis ([place])   . Asthma   . Atrial fibrillation ([place])   . Backache, unspecified   . Cancer ([place])    skin  . Cardiomegaly   . COPD (chronic obstructive pulmonary disease) ([place])   . Diabetes mellitus without complication ([place])   . 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 Esophageal reflux   . Headache(784.0)   . Herpes zoster without mention of complication   . Hx: UTI (urinary tract infection)   . Hypertension    heart controlled w CHF  . Nontoxic uninodular goiter   . Obesity, unspecified   . Osteoarthrosis, unspecified whether generalized or localized, unspecified site   . Unspecified sleep apnea   . Urine incontinence    hx of    Medications:  Scheduled:  . atorvastatin  20  mg Oral Daily  . azaTHIOprine  100 mg Oral Daily  . pantoprazole  40 mg Oral Daily  . venlafaxine XR  150 mg Oral Daily  . warfarin  6 mg Oral q1800    Assessment: Patient admitted for SOB s/t to pneumonia was called a code sepsis. Patient has as h/o afib (unsure if the patient is currently in afib as EKG still pending), and has a mechanical mitral valve. Patient was on warfarin & lovenox (post procedure) PTA (not sure why it wasn't on the previous med rec), but per care everywhere: Warfarin 7 mg Mon through Fri Warfarin 8 mg Sat & Sun  initial Heparin: Will bolus w/ 4000 units IV x 1 Will start drip @ 1250 units/hr  Baseline labs drawn, baseline aPTT slightly elevated, but normal.  Warfarin: Patient states she takes: 7 mg Sat & Sun, 8 mg Mon through Fri  11/5 INR@0201 = 1.36 subtherapeutic, Warfarin 7.5 x1 @ 0234         INR @0436 =1.50    11/6    Goal of Therapy:  INR 2.5 - 3.5 Monitor platelets by anticoagulation protocol: Yes   Plan:  Heparin: 11/4- HL @ 0900= 0.65. Will continue current drip rate  of 1250 units/hr and check confirmatory level in 8 hours.  Will monitor daily CBC's and will adjust per anti-Xa levels Will continue heparin drip for 2 consecutive therapeutic INRs (2.5 - 3.5) and bridge for at least 5 days.   Warfarin: Will continue warfarin 6 mg daily as currently ordered. Patient on abx. Will monitor daily INRs and will adjust dose per INR trend  Chinita Greenland PharmD Clinical Pharmacist 10/27/2018

## 2018-10-27 NOTE — Progress Notes (Signed)
Patient ID: Betty [name], female   DOB: 01/25/1946, 72 y.o.   MRN: [id number] Pulmonary/critical care attending  Discussed with family.  Patient does have a long history of slow rolling eye movements which has been worked up before most likely secondary to medication and not seizure activity.  Will DC EEG  [employer], DO

## 2018-10-27 NOTE — Progress Notes (Signed)
PHARMACY - PHYSICIAN COMMUNICATION CRITICAL VALUE ALERT - BLOOD CULTURE IDENTIFICATION (BCID)  Betty [name] is an 72 y.o. female who presented to [facility] on 10/26/2018 with a chief complaint of atrial fibrillation being treated with antibiotics for pneumonia. Patient now with Klebsiella pneumonia bacteremia.   Assessment:  Klebsiella pneumonia bacteremia.   Name of physician (or Provider) Contacted: [place]   Current antibiotics: Vancomycin/Meropenem   Changes to prescribed antibiotics recommended:  Discussed possible change to ceftriaxone 2g IV Q24hr. Due to patients history of ESBL infections, patient's vancomycin discontinued and meropenem was continued pending susceptibilities.   Results for orders placed or performed during the hospital encounter of 10/26/18  Blood Culture ID Panel (Reflexed) (Collected: 10/26/2018  9:54 PM)  Result Value Ref Range   Enterococcus species NOT DETECTED NOT DETECTED   Listeria monocytogenes NOT DETECTED NOT DETECTED   Staphylococcus species NOT DETECTED NOT DETECTED   Staphylococcus aureus (BCID) NOT DETECTED NOT DETECTED   Streptococcus species NOT DETECTED NOT DETECTED   Streptococcus agalactiae NOT DETECTED NOT DETECTED   Streptococcus pneumoniae NOT DETECTED NOT DETECTED   Streptococcus pyogenes NOT DETECTED NOT DETECTED   Acinetobacter baumannii NOT DETECTED NOT DETECTED   Enterobacteriaceae species DETECTED (A) NOT DETECTED   Enterobacter cloacae complex NOT DETECTED NOT DETECTED   Escherichia coli NOT DETECTED NOT DETECTED   Klebsiella oxytoca NOT DETECTED NOT DETECTED   Klebsiella pneumoniae DETECTED (A) NOT DETECTED   Proteus species NOT DETECTED NOT DETECTED   Serratia marcescens NOT DETECTED NOT DETECTED   Carbapenem resistance NOT DETECTED NOT DETECTED   Haemophilus influenzae NOT DETECTED NOT DETECTED   Neisseria meningitidis NOT DETECTED NOT DETECTED   Pseudomonas aeruginosa NOT DETECTED NOT DETECTED   Candida albicans NOT  DETECTED NOT DETECTED   Candida glabrata NOT DETECTED NOT DETECTED   Candida krusei NOT DETECTED NOT DETECTED   Candida parapsilosis NOT DETECTED NOT DETECTED   Candida tropicalis NOT DETECTED NOT DETECTED    , L 10/27/2018  11:35 AM

## 2018-10-27 NOTE — Progress Notes (Signed)
ANTICOAGULATION CONSULT NOTE -  Pharmacy Consult for warfarin/heparin drip for bridging Indication: atrial fibrillation/mechanical mitral valve  Allergies  Allergen Reactions  . Flecainide Shortness Of Breath and Other (See Comments)    Reaction: dizziness   . Amiodarone Other (See Comments)    Pt states that this medication causes lung bleeding.      Patient Measurements: Height: 5\' 9"  (175.3 cm) Weight: 216 lb 7.9 oz (98.2 kg) IBW/kg (Calculated) : 66.2 Heparin Dosing Weight: 87.7 kg  Vital Signs: Temp: 100.2 F (37.9 C) (11/05 1130) Temp Source: Oral (11/05 1130) BP: 113/98 (11/05 1400) Pulse Rate: 88 (11/05 1400)  Labs: Recent Labs    10/26/18 2154 10/27/18 0201 10/27/18 0436 10/27/18 0902  HGB 13.2  --  11.7*  --   HCT 41.5  --  37.2  --   PLT 225  --  188  --   APTT  --  39*  --   --   LABPROT 16.6* 16.7* 17.9*  --   INR 1.36 1.37 1.50  --   HEPARINUNFRC  --   --   --  0.65  CREATININE 1.15*  --  1.12*  --     Estimated Creatinine Clearance: 57.5 mL/min (A) (by C-G formula based on SCr of 1.12 mg/dL (H)).   Medical History: Past Medical History:  Diagnosis Date  . Acute diastolic heart failure ([place])   . Allergy   . ANCA-associated vasculitis ([place])   . Asthma   . Atrial fibrillation ([place])   . Backache, unspecified   . Cancer ([place])    skin  . Cardiomegaly   . COPD (chronic obstructive pulmonary disease) ([place])   . Diabetes mellitus without complication ([place])   . 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 Esophageal reflux   . Headache(784.0)   . Herpes zoster without mention of complication   . Hx: UTI (urinary tract infection)   . Hypertension    heart controlled w CHF  . Nontoxic uninodular goiter   . Obesity, unspecified   . Osteoarthrosis, unspecified whether generalized or localized, unspecified site   . Unspecified sleep apnea   . Urine incontinence    hx of    Medications:  Scheduled:  . atorvastatin  20  mg Oral Daily  . azaTHIOprine  100 mg Oral Daily  . insulin aspart  0-5 Units Subcutaneous QHS  . insulin aspart  0-9 Units Subcutaneous TID WC  . pantoprazole  40 mg Oral Daily  . venlafaxine XR  150 mg Oral Daily  . warfarin  6 mg Oral q1800    Assessment: Patient admitted for SOB s/t to pneumonia was called a code sepsis. Patient has as h/o afib (unsure if the patient is currently in afib as EKG still pending), and has a mechanical mitral valve. Patient was on warfarin & lovenox (post procedure) PTA (not sure why it wasn't on the previous med rec), but per care everywhere: Warfarin 7 mg Mon through Fri Warfarin 8 mg Sat & Sun  initial Heparin: Bolused w/ 4000 units IV x 1 Started drip @ 1250 units/hr  Baseline labs drawn, baseline aPTT slightly elevated, but normal.  Warfarin: Patient states she takes: 7 mg Sat & Sun, 8 mg Mon through Fri  11/5 INR@0201 = 1.36 subtherapeutic, Warfarin 7.5 x1 @ 0234         INR @0436 =1.50     11/6    Goal of Therapy:  INR 2.5 - 3.5  Monitor platelets  by anticoagulation protocol: Yes   Plan:   Heparin: 11/4 - HL @ 1700 = 0.60 level now therapeutic x 2  Will continue drip rate of 1250 units/hr and check heparin level with AM labs Will monitor daily CBC's and will adjust per anti-Xa levels Will continue heparin drip for 2 consecutive therapeutic INRs (2.5 - 3.5) and bridge for at least 5 days.   Warfarin: Will continue warfarin 6 mg daily as currently ordered. Patient on abx. Will monitor daily INRs and will adjust dose per INR trend  Lu Duffel, PharmD Clinical Pharmacist 10/27/2018 4:37 PM

## 2018-10-28 LAB — BASIC METABOLIC PANEL
ANION GAP: 12 (ref 5–15)
BUN: 18 mg/dL (ref 8–23)
CO2: 30 mmol/L (ref 22–32)
Calcium: 8.6 mg/dL — ABNORMAL LOW (ref 8.9–10.3)
Chloride: 99 mmol/L (ref 98–111)
Creatinine, Ser: 0.83 mg/dL (ref 0.44–1.00)
GFR calc Af Amer: >60 mL/min (ref >60)
GLUCOSE: 142 mg/dL — AB (ref 70–99)
POTASSIUM: 3.4 mmol/L — AB (ref 3.5–5.1)
Sodium: 141 mmol/L (ref 135–145)

## 2018-10-28 LAB — CBC
HCT: 41.4 % (ref 36.0–46.0)
HEMOGLOBIN: 12.9 g/dL (ref 12.0–15.0)
MCH: 29.1 pg (ref 26.0–34.0)
MCHC: 31.2 g/dL (ref 30.0–36.0)
MCV: 93.2 fL (ref 80.0–100.0)
PLATELETS: 144 10*3/uL — AB (ref 150–400)
RBC: 4.44 MIL/uL (ref 3.87–5.11)
RDW: 17 % — ABNORMAL HIGH (ref 11.5–15.5)
WBC: 7.6 10*3/uL (ref 4.0–10.5)
nRBC: 0 % (ref 0.0–0.2)

## 2018-10-28 LAB — PROTIME-INR
INR: 1.45
PROTHROMBIN TIME: 17.5 s — AB (ref 11.4–15.2)

## 2018-10-28 LAB — GLUCOSE, CAPILLARY
GLUCOSE-CAPILLARY: 140 mg/dL — AB (ref 70–99)
Glucose-Capillary: 226 mg/dL — ABNORMAL HIGH (ref 70–99)
Glucose-Capillary: 199 mg/dL — ABNORMAL HIGH (ref 70–99)
Glucose-Capillary: 187 mg/dL — ABNORMAL HIGH (ref 70–99)

## 2018-10-28 LAB — LACTIC ACID, PLASMA: Lactic Acid, Venous: 1.6 mmol/L (ref 0.5–1.9)

## 2018-10-28 LAB — HEPARIN LEVEL (UNFRACTIONATED): HEPARIN UNFRACTIONATED: 0.48 [IU]/mL (ref 0.30–0.70)

## 2018-10-28 LAB — MAGNESIUM: MAGNESIUM: 2.4 mg/dL (ref 1.7–2.4)

## 2018-10-28 MED ORDER — SENNA 8.6 MG PO TABS
1.0000 | ORAL_TABLET | Freq: Every day | ORAL | Status: DC
  Administered 2018-10-28 – 2018-11-03 (×5): 8.6 mg via ORAL
  Filled 2018-10-28 (×6): qty 1

## 2018-10-28 MED ORDER — POTASSIUM CHLORIDE CRYS ER 20 MEQ PO TBCR
30.0000 meq | EXTENDED_RELEASE_TABLET | Freq: Once | ORAL | Status: AC
  Administered 2018-10-28: 30 meq via ORAL
  Filled 2018-10-28: qty 2

## 2018-10-28 MED ORDER — WARFARIN SODIUM 4 MG PO TABS
8.0000 mg | ORAL_TABLET | Freq: Every day | ORAL | Status: DC
  Administered 2018-10-29 – 2018-10-30 (×2): 8 mg via ORAL
  Filled 2018-10-28 (×4): qty 2

## 2018-10-28 NOTE — Progress Notes (Addendum)
ANTICOAGULATION CONSULT NOTE -  Pharmacy Consult for warfarin/heparin bridge Indication: atrial fibrillation/mechanical mitral valve  Allergies  Allergen Reactions  . Flecainide Shortness Of Breath and Other (See Comments)    Reaction: dizziness   . Amiodarone Other (See Comments)    Pt states that this medication causes lung bleeding.      Patient Measurements: Height: 5\' 9"  (175.3 cm) Weight: 216 lb 7.9 oz (98.2 kg) IBW/kg (Calculated) : 66.2 Heparin Dosing Weight: 87.7 kg  Vital Signs: Temp: 100 F (37.8 C) (11/06 0800) Temp Source: Rectal (11/06 0800) BP: 128/63 (11/06 1100) Pulse Rate: 93 (11/06 1100)  Labs: Recent Labs    10/26/18 2154 10/27/18 0201 10/27/18 0436 10/27/18 0902 10/27/18 1705 10/28/18 0405  HGB 13.2  --  11.7*  --   --  12.9  HCT 41.5  --  37.2  --   --  41.4  PLT 225  --  188  --   --  144*  APTT  --  39*  --   --   --   --   LABPROT 16.6* 16.7* 17.9*  --   --  17.5*  INR 1.36 1.37 1.50  --   --  1.45  HEPARINUNFRC  --   --   --  0.65 0.60 0.48  CREATININE 1.15*  --  1.12*  --   --  0.83    Estimated Creatinine Clearance: 77.5 mL/min (by C-G formula based on SCr of 0.83 mg/dL).   Medical History: Past Medical History:  Diagnosis Date  . Acute diastolic heart failure ([place])   . Allergy   . ANCA-associated vasculitis ([place])   . Asthma   . Atrial fibrillation ([place])   . Backache, unspecified   . Cancer ([place])    skin  . Cardiomegaly   . COPD (chronic obstructive pulmonary disease) ([place])   . Diabetes mellitus without complication ([place])   . 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 Esophageal reflux   . Headache(784.0)   . Herpes zoster without mention of complication   . Hx: UTI (urinary tract infection)   . Hypertension    heart controlled w CHF  . Nontoxic uninodular goiter   . Obesity, unspecified   . Osteoarthrosis, unspecified whether generalized or localized, unspecified site   . Unspecified sleep  apnea   . Urine incontinence    hx of    Medications:  Scheduled:  . atorvastatin  20 mg Oral Daily  . gabapentin  300 mg Oral Q1200  . gabapentin  600 mg Oral BID  . insulin aspart  0-5 Units Subcutaneous QHS  . insulin aspart  0-9 Units Subcutaneous TID WC  . pantoprazole  40 mg Oral Daily  . venlafaxine XR  150 mg Oral Daily  . warfarin  8 mg Oral q1800  . Warfarin - Pharmacist Dosing Inpatient   Does not apply q1800    Assessment: Patient admitted for SOB s/t to pneumonia was called a code sepsis. Patient has as h/o afib and has a mechanical mitral valve. Patient takes warfarin 7mg  Monday - Friday and warfarin 8mg  Saturday and Sunday. Patient unsure of last dose. Patient currently being bridged with heparin at 1250 units/hr, will continue. Patient is currently receiving Merrem, but did receive Cefepime and Vancomycin 11/5. Only interaction is increase of INR with Cefepime.   Goal of Therapy:  INR 2.5 - 3.5 Monitor platelets by anticoagulation protocol: Yes   Plan:  11/6 INR 1.45, HL =  0.48. Will continue heparin 1250 units/hr. Will recheck anti-Xa level with am labs and adjust accordingly.   Patient received total of warfarin 10.5mg  on 11/5. Ordered warfarin 8 mg PO tonight. Will obtain INR/HL with am labs. Patient will need to bridged for at least 5 days and two consecutive INRs > 2.0.   Pharmacy will continue to monitor and adjust per consult.    Paticia Stack, PharmD Pharmacy Resident  10/28/2018 11:13 AM

## 2018-10-28 NOTE — Progress Notes (Addendum)
Pharmacy Antibiotic Note  Betty [name] is a 72 y.o. female admitted on 10/26/2018 with sepsis secondary to UTI/PNA. Patient is on immunosuppressives at home.  Pharmacy has been consulted for Meropenem dosing.  Plan: Continue Meropenem 1g IV q8h. Patient with history of ESBL infections. Narrowing deferred until sensitivities known.    Height: 5\' 9"  (175.3 cm) Weight: 216 lb 7.9 oz (98.2 kg) IBW/kg (Calculated) : 66.2  Temp (24hrs), Avg:100.9 F (38.3 C), Min:99.1 F (37.3 C), Max:103.3 F (39.6 C)  Recent Labs  Lab 10/26/18 2154 10/27/18 0012 10/27/18 0436 10/27/18 0437 10/28/18 0104 10/28/18 0405  WBC 16.3*  --  13.7*  --   --  7.6  CREATININE 1.15*  --  1.12*  --   --  0.83  LATICACIDVEN 2.8* 2.5*  --  2.1* 1.6  --     Estimated Creatinine Clearance: 77.5 mL/min (by C-G formula based on SCr of 0.83 mg/dL).    Allergies  Allergen Reactions  . Flecainide Shortness Of Breath and Other (See Comments)    Reaction: dizziness   . Amiodarone Other (See Comments)    Pt states that this medication causes lung bleeding.     Thank you for allowing pharmacy to be a part of this patient's care.  Medications: 11/5 Meropenem >> 11/5 Vancomycin x 1 11/5 Cefepime x 1  Microbiology: 11/4 BCx Klebsiella pneumoniae 11/5 MRSA PCR (-)  Paticia Stack, PharmD Pharmacy Resident  10/28/2018 11:31 AM

## 2018-10-28 NOTE — Progress Notes (Addendum)
Pt states that she takes torsemide 80 mg twice a day. As per pt, her wt wat at 209 prior to admission but on out floor the pt weighs 217 lbs.She states that her cardiologist Dr. Tammi Klippel from [facility] prescribe it to her. Pt also states that she wear CPAP at home every night. Will notify the prime.  Update 2253: Talked to Dr. Sima Matas torsemide and she states that pt QT was prolonged and that's and pt might get dehydrated. Torsemide was not order for that reason. Doctor maier did place an order for CPAP. Will continue to monitor.  Update 0219: Pt request meds to help with sleep. Doctor Aliene Altes ordered Trazodone 50 mg tablet PRN to help with sleep. Will continue to monitor.  Update 0459: Talked to Dr. Cristela Felt about pt having having an episode of bleeding on right nostril ( blow pt nose one time and a nickel shape of clots came out from her nose) and a dry blood on mouth, and HR going up at 120-140 then back to 104. Notify prime. Will continue to monitor.  Update 0551: Doctor Prasanna ordered CBC with differential/ Platelet and IV metroplol 2.5 mg IV once. Will continue to monitor.

## 2018-10-28 NOTE — Progress Notes (Signed)
Follow up - Critical Care Medicine Note  Patient Details:    Betty [name] is an 72 y.o. 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  Per report family states she was lethargic and unable to answer questions coherently.  She presented febrile and was felt to be septic with possible source of urine and pneumonia.  She is presently on immunosuppressive therapy to include Imuran, is on meropenem and vancomycin for this admission along with heparin.  When she arrives into the ICU she will communicate but is confused, stable hemodynamics at this time  Lines, Airways, Drains: Airway 7.5 mm (Active)     External Urinary Catheter (Active)  Collection Container Dedicated Suction Canister 10/27/2018 11:00 PM  Output (mL) 800 mL 10/28/2018  5:00 AM    Anti-infectives:  Anti-infectives (From admission, onward)   Start     Dose/Rate Route Frequency Ordered Stop   10/27/18 0600  vancomycin (VANCOCIN) IVPB 1000 mg/200 mL premix  Status:  Discontinued     1,000 mg 200 mL/hr over 60 Minutes Intravenous Every 12 hours 10/27/18 0128 10/27/18 1113   10/27/18 0015  vancomycin (VANCOCIN) IVPB 1000 mg/200 mL premix     1,000 mg 200 mL/hr over 60 Minutes Intravenous  Once 10/27/18 0004 10/27/18 0336   10/27/18 0015  meropenem (MERREM) 1 g in sodium chloride 0.9 % 100 mL IVPB     1 g 200 mL/hr over 30 Minutes Intravenous Every 8 hours 10/27/18 0004     10/26/18 2200  ceFEPIme (MAXIPIME) 1 g in sodium chloride 0.9 % 100 mL IVPB     1 g 200 mL/hr over 30 Minutes Intravenous  Once 10/26/18 2147 10/26/18 2232      Microbiology: Results for orders placed or performed during the hospital encounter of 10/26/18  Blood Culture  (routine x 2)     Status: None (Preliminary result)   Collection Time: 10/26/18  9:54 PM  Result Value Ref Range Status   Specimen Description   Final    BLOOD BLOOD RIGHT WRIST Performed at [facility], [address] [place], [place] [address]    Special Requests   Final    BOTTLES DRAWN AEROBIC AND ANAEROBIC Blood Culture adequate volume Performed at [facility], [address] [place], [place] [address]    Culture  Setup Time   Final    GRAM NEGATIVE RODS IN BOTH AEROBIC AND ANAEROBIC BOTTLES CRITICAL VALUE NOTED.  VALUE IS CONSISTENT WITH PREVIOUSLY REPORTED AND CALLED VALUE.    Culture GRAM NEGATIVE RODS  Final   Report Status PENDING  Incomplete  Blood Culture (routine x 2)     Status: Abnormal (Preliminary result)   Collection Time: 10/26/18  9:54 PM  Result Value Ref Range Status   Specimen Description   Final    BLOOD BLOOD RIGHT HAND Performed at [facility], [address], [place], [place] [address]    Special Requests   Final    BOTTLES DRAWN AEROBIC AND ANAEROBIC Blood Culture results may not be optimal due to an excessive volume of blood received in culture bottles Performed at [facility], [address] [place], [place] [address]    Culture  Setup Time   Final    GRAM NEGATIVE RODS IN BOTH AEROBIC  AND ANAEROBIC BOTTLES CRITICAL RESULT CALLED TO, READ BACK BY AND VERIFIED WITH: HANK ZOMPA AT 9024 ON 10/27/18 [place].    Culture (A)  Final    KLEBSIELLA PNEUMONIAE SUSCEPTIBILITIES TO FOLLOW Performed at [facility], [place] [address] [place], [place] [address]    Report Status PENDING  Incomplete  Blood Culture ID Panel (Reflexed)     Status: Abnormal   Collection Time: 10/26/18  9:54 PM  Result Value Ref Range Status   Enterococcus species NOT DETECTED NOT DETECTED Final   Listeria monocytogenes NOT DETECTED NOT DETECTED Final   Staphylococcus species NOT DETECTED NOT DETECTED Final   Staphylococcus aureus  (BCID) NOT DETECTED NOT DETECTED Final   Streptococcus species NOT DETECTED NOT DETECTED Final   Streptococcus agalactiae NOT DETECTED NOT DETECTED Final   Streptococcus pneumoniae NOT DETECTED NOT DETECTED Final   Streptococcus pyogenes NOT DETECTED NOT DETECTED Final   Acinetobacter baumannii NOT DETECTED NOT DETECTED Final   Enterobacteriaceae species DETECTED (A) NOT DETECTED Final    Comment: Enterobacteriaceae represent a large family of gram-negative bacteria, not a single organism. CRITICAL RESULT CALLED TO, READ BACK BY AND VERIFIED WITH: HANK ZOMPA AT 3299 ON 10/27/18 [place].    Enterobacter cloacae complex NOT DETECTED NOT DETECTED Final   Escherichia coli NOT DETECTED NOT DETECTED Final   Klebsiella oxytoca NOT DETECTED NOT DETECTED Final   Klebsiella pneumoniae DETECTED (A) NOT DETECTED Final    Comment: HANK ZOMPA AT 2426 ON 10/27/18 [place].   Proteus species NOT DETECTED NOT DETECTED Final   Serratia marcescens NOT DETECTED NOT DETECTED Final   Carbapenem resistance NOT DETECTED NOT DETECTED Final   Haemophilus influenzae NOT DETECTED NOT DETECTED Final   Neisseria meningitidis NOT DETECTED NOT DETECTED Final   Pseudomonas aeruginosa NOT DETECTED NOT DETECTED Final   Candida albicans NOT DETECTED NOT DETECTED Final   Candida glabrata NOT DETECTED NOT DETECTED Final   Candida krusei NOT DETECTED NOT DETECTED Final   Candida parapsilosis NOT DETECTED NOT DETECTED Final   Candida tropicalis NOT DETECTED NOT DETECTED Final    Comment: Performed at [facility], [address] [place], [place] [address]  MRSA PCR Screening     Status: None   Collection Time: 10/27/18 10:40 AM  Result Value Ref Range Status   MRSA by PCR NEGATIVE NEGATIVE Final    Comment:        The GeneXpert MRSA Assay (FDA approved for NASAL specimens only), is one component of a comprehensive MRSA colonization surveillance program. It is not intended to diagnose MRSA infection nor to guide  or monitor treatment for MRSA infections. Performed at [facility], [address] [place], [place] [address]     Studies: Ct Head Wo Contrast  Result Date: 10/27/2018 CLINICAL DATA:  72 y/o F; involuntary eye rolling head movements. Altered mental status, unclear cause. EXAM: CT HEAD WITHOUT CONTRAST TECHNIQUE: Contiguous axial images were obtained from the base of the skull through the vertex without intravenous contrast. COMPARISON:  02/19/2018 CT head. FINDINGS: Brain: Motion degraded study. No evidence of acute infarction, hemorrhage, hydrocephalus, extra-axial collection or mass lesion/mass effect. 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 Stable mild chronic microvascular ischemic changes and volume loss of the brain. Vascular: Calcific atherosclerosis of carotid siphons. No hyperdense vessel identified. Skull: Normal. Negative for fracture or focal lesion. Sinuses/Orbits: Moderate right maxillary sinus mucosal thickening. Additional visible paranasal sinuses and the mastoid air cells are normally aerated. Other: Bilateral intra-ocular lens  replacement. IMPRESSION: Motion degraded study. 1. No acute intracranial abnormality. 2. 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 Electronically Signed   By: Kristine Garbe M.D.   On: 10/27/2018 14:32   Dg Chest Portable 1 View  Result Date: 10/26/2018 CLINICAL DATA:  Shortness of breath and altered mental status EXAM: PORTABLE CHEST 1 VIEW COMPARISON:  02/28/2018 chest radiograph FINDINGS: Left chest wall pacemaker leads are in unchanged position. Sequelae of median sternotomy and valve replacement. There is shallow lung inflation with mild interstitial opacity. No pneumothorax or sizable pleural effusion. Mild cardiomegaly is unchanged. There is calcific aortic atherosclerosis. IMPRESSION: Shallow lung inflation with chronic  interstitial opacities, unchanged. Electronically Signed   By: Ulyses Jarred M.D.   On: 10/26/2018 22:16   Mm 3d Screen Breast Bilateral  Result Date: 10/19/2018 CLINICAL DATA:  Screening. EXAM: DIGITAL SCREENING BILATERAL MAMMOGRAM WITH TOMO AND CAD COMPARISON:  Previous exam(s). ACR Breast Density Category b: There are scattered areas of fibroglandular density. FINDINGS: There are no findings suspicious for malignancy. Images were processed with CAD. IMPRESSION: No mammographic evidence of malignancy. A result letter of this screening mammogram will be mailed directly to the patient. RECOMMENDATION: Screening mammogram in one year. (Code:SM-B-01Y) BI-RADS CATEGORY  1: Negative. Electronically Signed   By: Kristopher Oppenheim M.D.   On: 10/19/2018 14:15    Consults:    Subjective:    Overnight Issues: Patient has had complete resolution of her neurologic status.  Improved mentation, awake, alert, communicating  Objective:  Vital signs for last 24 hours: Temp:  [99.1 F (37.3 C)-103.3 F (39.6 C)] 99.1 F (37.3 C) (11/06 0400) Pulse Rate:  [86-96] 91 (11/06 0400) Resp:  [19-32] 20 (11/06 0400) BP: (92-149)/(54-116) 147/80 (11/06 0400) SpO2:  [89 %-99 %] 99 % (11/06 0400) Weight:  [98.2 kg] 98.2 kg (11/05 1130)  Hemodynamic parameters for last 24 hours:    Intake/Output from previous day: 11/05 0701 - 11/06 0700 In: 905.6 [I.V.:711.8; IV Piggyback:193.8] Out: 2500 [Urine:2500]  Intake/Output this shift: No intake/output data recorded.  Physical Exam:  Vital signs:       Please see the above listed vital signs HEENT:           Trachea midline, no oral lesions appreciated.  Patient does have slow rolling eye movements versus horizontal nystagmus, no oral lesions noted Cardiovascular:           Regular rate and rhythm Pulmonary:      Clear to auscultation Abdominal:      Positive bowel sounds, soft exam Extremities:     No clubbing cyanosis or edema noted Neurologic:      Patient  moves all extremities, no clear deficits   Assessment/Plan:   Sepsis.  Patient is on immunosuppressive's with underlying history of vasculitis, agree with meropenem and vancomycin.  Blood cultures already positive for Klebsiella pneumonia, possible source urine and lung.  Patient with elevated lactic acid.  History of mechanical mitral valve.    On heparin with bridge to Coumadin  Hypokalemia.  Will replace  Renal insufficiency.    Resolved  Leukocytosis.    Resolved  Altered mental status.   CT scan of the head was negative.  Altered mentation has resolved  At this point patient is stable for floor transfer   Annayah Worthley 10/28/2018  *Care during the described time interval was provided by me and/or other providers on the critical care team.  I have reviewed this  patient's available data, including medical history, events of note, physical examination and test results as part of my evaluation.

## 2018-10-28 NOTE — Plan of Care (Signed)
  Problem: Clinical Measurements: Goal: Will remain free from infection Outcome: Progressing Goal: Diagnostic test results will improve Outcome: Progressing Goal: Respiratory complications will improve Outcome: Progressing Goal: Cardiovascular complication will be avoided Outcome: Progressing   Problem: Activity: Goal: Risk for activity intolerance will decrease Outcome: Progressing   Problem: Nutrition: Goal: Adequate nutrition will be maintained Outcome: Progressing   Problem: Coping: Goal: Level of anxiety will decrease Outcome: Progressing   Problem: Elimination: Goal: Will not experience complications related to urinary retention Outcome: Progressing

## 2018-10-28 NOTE — Evaluation (Signed)
Physical Therapy Evaluation Patient Details Name: Betty [name] MRN: [id number] DOB: June 21, 1946 Today's Date: 10/28/2018   History of Present Illness  presented to ER secondary to weakness, AMS; admitted with sepsis due to UTI, PNA  Clinical Impression  Upon evaluation, patient alert and oriented; follows commands and demonstrates fair insight into deficits/safety needs.  Bilat Ue/LE strength and ROM grossly symmetrical and WFL for basic transfers and mobility; no focal weakness, denies pain.  Able to complete bed mobility with sup/mod indep; sit/stand, basic transfer and short-distance gait (bed/chair) with RW, cga for safety.  Minimal use of RW; may plan to trial without next session. Vitals stable and WFL on 4L supplemental O2. Would benefit from skilled PT to address above deficits and promote optimal return to PLOF; Recommend transition to [place] upon discharge from acute hospitalization.     Follow Up Recommendations Home health PT    Equipment Recommendations  Rolling walker with 5" wheels    Recommendations for Other Services       Precautions / Restrictions Precautions Precautions: Fall Precaution Comments: contact iso Restrictions Weight Bearing Restrictions: No      Mobility  Bed Mobility Overal bed mobility: Modified Independent                Transfers Overall transfer level: Needs assistance Equipment used: Rolling walker (2 wheeled) Transfers: Sit to/from Stand Sit to Stand: Min guard         General transfer comment: cuing for hand placement to prevent pulling on RW  Ambulation/Gait Ambulation/Gait assistance: Min guard Gait Distance (Feet): 5 Feet Assistive device: Rolling walker (2 wheeled)       General Gait Details: generally stable with minimal reliance on RW; vitals stable and WFL on 4L; distance limited by temp probe/cord at this time (rectal)  Stairs            Wheelchair Mobility    Modified Rankin (Stroke Patients Only)        Balance Overall balance assessment: Needs assistance Sitting-balance support: No upper extremity supported;Feet supported Sitting balance-Leahy Scale: Good     Standing balance support: Bilateral upper extremity supported Standing balance-Leahy Scale: Fair                               Pertinent Vitals/Pain Pain Assessment: No/denies pain    Home Living Family/patient expects to be discharged to:: Private residence Living Arrangements: Children Available Help at Discharge: Family;Available PRN/intermittently Type of Home: House Home Access: Stairs to enter Entrance Stairs-Rails: Right;Left;Can reach both Entrance Stairs-Number of Steps: 2 Home Layout: One level        Prior Function Level of Independence: Independent         Comments: Indep with ADLs, household and community mobilization without assist device; home O2 at 2L; denies recent fall history (within 6 months)     Hand Dominance        Extremity/Trunk Assessment   Upper Extremity Assessment Upper Extremity Assessment: Overall WFL for tasks assessed    Lower Extremity Assessment Lower Extremity Assessment: Overall WFL for tasks assessed(grossly at least 4/5 throughout)       Communication   Communication: No difficulties  Cognition Arousal/Alertness: Awake/alert Behavior During Therapy: WFL for tasks assessed/performed Overall Cognitive Status: Within Functional Limits for tasks assessed  General Comments      Exercises     Assessment/Plan    PT Assessment Patient needs continued PT services  PT Problem List Decreased strength;Decreased range of motion;Decreased activity tolerance;Decreased balance;Decreased mobility;Decreased coordination;Cardiopulmonary status limiting activity;Decreased knowledge of use of DME;Decreased knowledge of precautions;Decreased safety awareness       PT Treatment Interventions  Therapeutic activities;DME instruction;Functional mobility training;Therapeutic exercise;Gait training;Balance training;Stair training;Patient/family education    PT Goals (Current goals can be found in the Care Plan section)  Acute Rehab PT Goals Patient Stated Goal: to return home PT Goal Formulation: With patient Time For Goal Achievement: 11/11/18 Potential to Achieve Goals: Good    Frequency Min 2X/week   Barriers to discharge        Co-evaluation               AM-PAC PT "6 Clicks" Daily Activity  Outcome Measure Difficulty turning over in bed (including adjusting bedclothes, sheets and blankets)?: A Little Difficulty moving from lying on back to sitting on the side of the bed? : A Little Difficulty sitting down on and standing up from a chair with arms (e.g., wheelchair, bedside commode, etc,.)?: A Little Help needed moving to and from a bed to chair (including a wheelchair)?: A Little Help needed walking in hospital room?: A Little Help needed climbing 3-5 steps with a railing? : A Little 6 Click Score: 18    End of Session Equipment Utilized During Treatment: Gait belt Activity Tolerance: Patient tolerated treatment well Patient left: in chair;with call bell/phone within reach;with family/visitor present Nurse Communication: Mobility status PT Visit Diagnosis: Muscle weakness (generalized) (M62.81);Difficulty in walking, not elsewhere classified (R26.2)    Time: 7262-0355 PT Time Calculation (min) (ACUTE ONLY): 23 min   Charges:   PT Evaluation $PT Eval Moderate Complexity: 1 Mod          Demaryius Imran H. Owens Shark, PT, DPT, NCS 10/28/18, 4:49 PM [phone]

## 2018-10-28 NOTE — Progress Notes (Signed)
[place] [facility] [place] NAME: Betty [name]    MR#:  [id number]  DATE OF BIRTH:  1946/03/17  SUBJECTIVE:  CHIEF COMPLAINT:   Chief Complaint  Patient presents with  . Code Sepsis  . Fatigue  much improved, sitting in chair and alert - working with PT. Friend at bedside REVIEW OF SYSTEMS:  Review of Systems  Constitutional: Negative for diaphoresis, fever, malaise/fatigue and weight loss.  HENT: Negative for ear discharge, ear pain, hearing loss, nosebleeds, sore throat and tinnitus.   Eyes: Negative for blurred vision and pain.  Respiratory: Negative for cough, hemoptysis, shortness of breath and wheezing.   Cardiovascular: Negative for chest pain, palpitations, orthopnea and leg swelling.  Gastrointestinal: Negative for abdominal pain, blood in stool, constipation, diarrhea, heartburn, nausea and vomiting.  Genitourinary: Negative for dysuria, frequency and urgency.  Musculoskeletal: Negative for back pain and myalgias.  Skin: Negative for itching and rash.  Neurological: Negative for dizziness, tingling, tremors, focal weakness, seizures, weakness and headaches.  Psychiatric/Behavioral: Negative for depression. The patient is not nervous/anxious.    DRUG ALLERGIES:   Allergies  Allergen Reactions  . Flecainide Shortness Of Breath and Other (See Comments)    Reaction: dizziness   . Amiodarone Other (See Comments)    Pt states that this medication causes lung bleeding.     VITALS:  Blood pressure 124/69, pulse 92, temperature 100.2 F (37.9 C), temperature source Rectal, resp. rate (!) 27, height 5\' 9"  (1.753 m), weight 98.2 kg, SpO2 96 %. PHYSICAL EXAMINATION:  Physical Exam  HENT:  Head: Normocephalic and atraumatic.  Eyes: Pupils are equal, round, and reactive to light. Conjunctivae and EOM are normal.  Neck: Normal range of motion. Neck supple. No tracheal deviation present. No thyromegaly present.  Cardiovascular: Normal rate,  regular rhythm and normal heart sounds.  Pulmonary/Chest: Effort normal and breath sounds normal. No respiratory distress. She has no wheezes. She exhibits no tenderness.  Abdominal: Soft. Bowel sounds are normal. She exhibits no distension. There is no tenderness.  Musculoskeletal: Normal range of motion.  Neurological: She is alert. She is disoriented. No cranial nerve deficit.  Skin: Skin is warm and dry. No rash noted.  Psychiatric: Her affect is inappropriate.   LABORATORY PANEL:  Female CBC Recent Labs  Lab 10/28/18 0405  WBC 7.6  HGB 12.9  HCT 41.4  PLT 144*   ------------------------------------------------------------------------------------------------------------------ Chemistries  Recent Labs  Lab 10/26/18 2154  10/28/18 0405  NA 134*   < > 141  K 3.4*   < > 3.4*  CL 92*   < > 99  CO2 31   < > 30  GLUCOSE 161*   < > 142*  BUN 28*   < > 18  CREATININE 1.15*   < > 0.83  CALCIUM 8.8*   < > 8.6*  MG  --    < > 2.4  AST 39  --   --   ALT 21  --   --   ALKPHOS 109  --   --   BILITOT 1.2  --   --    < > = values in this interval not displayed.   RADIOLOGY:  No results found. ASSESSMENT AND PLAN:  15  y f admitted for sepsis due to UTI  * Klebsiella Sepsis ([place]) -present on admission due to UTI and possible pneumonia - continue meropenem and vanco - BCID + for Klebsiella - Appreciate PCCM assistance-   * UTI (urinary  tract infection) -IV antibiotics as above, culture sent  * PAF (paroxysmal atrial fibrillation) (HCC) -continue home rate controlling medications and anticoagulation  * Mechanical mitral valve -patient has been on therapeutic Lovenox shots due to recent procedure, she is now transitioning back to her warfarin.  Continue therapeutic Lovenox dose for now until her INR is therapeutic - Pharmacy to monitor  * Hypokalemia - replete and recheck  * Controlled type 2 diabetes mellitus without complication (HCC) -sliding scale insulin with  corresponding glucose checks  * h/o hypertension -hold antihypertensives for now as the patient's blood pressure is on the borderline low side for, IV fluids as above for blood pressure support  * COPD (chronic obstructive pulmonary disease) ([place]) -home dose inhalers   * ANCA-associated vasculitis ([place]) -continue home meds   HHPT   All the records are reviewed and case discussed with Care Management/Social Worker. Management plans discussed with the patient, nursing and they are in agreement.  CODE STATUS: Full Code  TOTAL TIME TAKING CARE OF THIS PATIENT: 15 minutes.   More than 50% of the time was spent in counseling/coordination of care: YES  POSSIBLE D/C IN 2-3 DAYS, DEPENDING ON CLINICAL CONDITION.   Max Sane M.D on 10/28/2018 at 6:50 PM  Between 7am to 6pm - Pager - [phone]  After 6pm go to [email or web address] - Proofreader  [facility]  Office  [phone]  CC: Primary care physician; Dion Body, MD  Note: This dictation was prepared with Dragon dictation along with smaller phrase technology. Any transcriptional errors that result from this process are unintentional.

## 2018-10-28 NOTE — Progress Notes (Signed)
Pharmacy Electrolyte Monitoring Consult:  Pharmacy consulted to assist in monitoring and replacing electrolytes in this 72 y.o. female admitted on 10/26/2018 with atrial fibrillation. Patient transferred to ICU on 11/5.   Labs:  Sodium (mmol/L)  Date Value  10/28/2018 141   Potassium (mmol/L)  Date Value  10/28/2018 3.4 (L)  09/27/2014 3.7   Magnesium (mg/dL)  Date Value  10/28/2018 2.4   Phosphorus (mg/dL)  Date Value  03/01/2018 2.4 (L)   Calcium (mg/dL)  Date Value  10/28/2018 8.6 (L)   Albumin (g/dL)  Date Value  10/26/2018 4.1    Assessment/Plan: Glucose:  Patient on sensitive scale SSI, is diabetic. BG for last 24 hours 96-142.  Electrolytes:  Will replace for goal potassium ~4 and goal magnesium ~ 2.  Ordered Potassium 6mEq PO x 2.   Will check BMP/Magnesium with am labs.   Pharmacy will continue to monitor and adjust per consult.    Paticia Stack, PharmD Pharmacy Resident  10/28/2018 11:19 AM

## 2018-10-29 LAB — HEPARIN LEVEL (UNFRACTIONATED)
HEPARIN UNFRACTIONATED: 0.26 [IU]/mL — AB (ref 0.30–0.70)
HEPARIN UNFRACTIONATED: 0.33 [IU]/mL (ref 0.30–0.70)
Heparin Unfractionated: 0.35 IU/mL (ref 0.30–0.70)

## 2018-10-29 LAB — GLUCOSE, CAPILLARY
GLUCOSE-CAPILLARY: 173 mg/dL — AB (ref 70–99)
GLUCOSE-CAPILLARY: 175 mg/dL — AB (ref 70–99)
Glucose-Capillary: 247 mg/dL — ABNORMAL HIGH (ref 70–99)
Glucose-Capillary: 139 mg/dL — ABNORMAL HIGH (ref 70–99)
Glucose-Capillary: 129 mg/dL — ABNORMAL HIGH (ref 70–99)

## 2018-10-29 LAB — CULTURE, BLOOD (ROUTINE X 2): Special Requests: ADEQUATE

## 2018-10-29 LAB — CBC WITH DIFFERENTIAL/PLATELET
ABS IMMATURE GRANULOCYTES: 0.04 10*3/uL (ref 0.00–0.07)
Basophils Absolute: 0 10*3/uL (ref 0.0–0.1)
Basophils Relative: 0 %
Eosinophils Absolute: 0.2 10*3/uL (ref 0.0–0.5)
Eosinophils Relative: 3 %
HEMATOCRIT: 37.2 % (ref 36.0–46.0)
HEMOGLOBIN: 11.8 g/dL — AB (ref 12.0–15.0)
IMMATURE GRANULOCYTES: 1 %
LYMPHS ABS: 0.4 10*3/uL — AB (ref 0.7–4.0)
LYMPHS PCT: 8 %
MCH: 29.4 pg (ref 26.0–34.0)
MCHC: 31.7 g/dL (ref 30.0–36.0)
MCV: 92.5 fL (ref 80.0–100.0)
MONO ABS: 1 10*3/uL (ref 0.1–1.0)
MONOS PCT: 18 %
NEUTROS ABS: 4.1 10*3/uL (ref 1.7–7.7)
NEUTROS PCT: 70 %
PLATELETS: 148 10*3/uL — AB (ref 150–400)
RBC: 4.02 MIL/uL (ref 3.87–5.11)
RDW: 17.1 % — ABNORMAL HIGH (ref 11.5–15.5)
WBC: 5.7 10*3/uL (ref 4.0–10.5)
nRBC: 0 % (ref 0.0–0.2)

## 2018-10-29 LAB — BASIC METABOLIC PANEL
Anion gap: 8 (ref 5–15)
BUN: 16 mg/dL (ref 8–23)
CO2: 29 mmol/L (ref 22–32)
Calcium: 8.8 mg/dL — ABNORMAL LOW (ref 8.9–10.3)
Chloride: 101 mmol/L (ref 98–111)
Creatinine, Ser: 0.6 mg/dL (ref 0.44–1.00)
GFR calc Af Amer: >60 mL/min (ref >60)
Glucose, Bld: 163 mg/dL — ABNORMAL HIGH (ref 70–99)
POTASSIUM: 3.6 mmol/L (ref 3.5–5.1)
Sodium: 138 mmol/L (ref 135–145)

## 2018-10-29 LAB — PROTIME-INR
INR: 1.41
PROTHROMBIN TIME: 17.1 s — AB (ref 11.4–15.2)

## 2018-10-29 LAB — MAGNESIUM: Magnesium: 2.3 mg/dL (ref 1.7–2.4)

## 2018-10-29 MED ORDER — SODIUM CHLORIDE 0.9 % IV SOLN
INTRAVENOUS | Status: DC | PRN
  Administered 2018-10-29 (×2): 500 mL via INTRAVENOUS

## 2018-10-29 MED ORDER — TORSEMIDE 20 MG PO TABS
80.0000 mg | ORAL_TABLET | Freq: Two times a day (BID) | ORAL | Status: DC
  Administered 2018-10-29 – 2018-11-03 (×10): 80 mg via ORAL
  Filled 2018-10-29 (×11): qty 4

## 2018-10-29 MED ORDER — METOPROLOL TARTRATE 5 MG/5ML IV SOLN
2.5000 mg | Freq: Once | INTRAVENOUS | Status: AC
  Administered 2018-10-29: 2.5 mg via INTRAVENOUS
  Filled 2018-10-29: qty 5

## 2018-10-29 MED ORDER — HEPARIN BOLUS VIA INFUSION
1300.0000 [IU] | Freq: Once | INTRAVENOUS | Status: AC
  Administered 2018-10-29: 1300 [IU] via INTRAVENOUS
  Filled 2018-10-29: qty 1300

## 2018-10-29 MED ORDER — SODIUM CHLORIDE 0.9 % IV SOLN
2.0000 g | INTRAVENOUS | Status: DC
  Administered 2018-10-29 – 2018-11-02 (×5): 2 g via INTRAVENOUS
  Filled 2018-10-29: qty 2
  Filled 2018-10-29: qty 20
  Filled 2018-10-29 (×4): qty 2

## 2018-10-29 MED ORDER — TRAZODONE HCL 50 MG PO TABS
50.0000 mg | ORAL_TABLET | Freq: Every evening | ORAL | Status: DC | PRN
  Administered 2018-10-29 – 2018-11-02 (×5): 50 mg via ORAL
  Filled 2018-10-29 (×6): qty 1

## 2018-10-29 MED ORDER — SPIRONOLACTONE 25 MG PO TABS
12.5000 mg | ORAL_TABLET | Freq: Every day | ORAL | Status: DC
  Administered 2018-10-29 – 2018-11-03 (×6): 12.5 mg via ORAL
  Filled 2018-10-29: qty 0.5
  Filled 2018-10-29 (×2): qty 1
  Filled 2018-10-29 (×3): qty 0.5
  Filled 2018-10-29 (×2): qty 1
  Filled 2018-10-29: qty 0.5
  Filled 2018-10-29: qty 1
  Filled 2018-10-29: qty 0.5

## 2018-10-29 NOTE — Progress Notes (Signed)
Pharmacy Electrolyte Monitoring Consult:  Pharmacy consulted to assist in monitoring and replacing electrolytes in this 72 y.o. female admitted on 10/26/2018 with atrial fibrillation. Patient transferred out of ICU on 11/6.   Labs:  Sodium (mmol/L)  Date Value  10/29/2018 138   Potassium (mmol/L)  Date Value  10/29/2018 3.6  09/27/2014 3.7   Magnesium (mg/dL)  Date Value  10/29/2018 2.3   Phosphorus (mg/dL)  Date Value  03/01/2018 2.4 (L)   Calcium (mg/dL)  Date Value  10/29/2018 8.8 (L)   Albumin (g/dL)  Date Value  10/26/2018 4.1    Assessment/Plan: Glucose:  Patient on sensitive scale SSI, is diabetic. BG for last 24 hours 96-142.  Electrolytes:  Will replace for goal potassium ~4 and goal magnesium ~ 2.  No additional supplementation today.  Will check BMP/Magnesium with am labs.   Pharmacy will continue to monitor and adjust per consult.    Paulina Fusi, PharmD, BCPS 10/29/2018 3:15 PM

## 2018-10-29 NOTE — Progress Notes (Signed)
I have reviewed the assessment performed by the student and agree with or have made changes.

## 2018-10-29 NOTE — Care Management Note (Signed)
Case Management Note  Patient Details  Name: Betty [name] MRN: [id number] Date of Birth: Feb 08, 1946  Subjective/Objective:   Patient is from home.  Son and daughter in law live with her.  She has a hx COPD and CHF.   Chronic O2 @2L .  Currently on 3L.  Currently on 80mg  torsemide twice a day.  Independent in all adls, denies issues accessing medical care, obtaining medications or with transportation.  Current with PCP.  Offered choice for home health services.  Referral made to [facility] for RN and PT and they accepted.  Will notify [place] with [facility] at D/C.  Scale provided to patient as she does not have a functioning scale.                 Action/Plan:   Expected Discharge Date:                  Expected Discharge Plan:  [place]  In-House Referral:     Discharge planning Services  CM Consult  Post Acute Care Choice:    Choice offered to:     DME Arranged:    DME Agency:     HH Arranged:  RN, PT [place] Agency:  [facility]  Status of Service:  Completed, signed off  If discussed at [place] of Stay Meetings, dates discussed:    Additional Comments:  Elza Rafter, RN 10/29/2018, 3:53 PM

## 2018-10-29 NOTE — Progress Notes (Signed)
ANTICOAGULATION CONSULT NOTE -  Pharmacy Consult for warfarin/heparin bridge Indication: atrial fibrillation/mechanical mitral valve  Allergies  Allergen Reactions  . Flecainide Shortness Of Breath and Other (See Comments)    Reaction: dizziness   . Amiodarone Other (See Comments)    Pt states that this medication causes lung bleeding.      Patient Measurements: Height: 5\' 9"  (175.3 cm) Weight: 217 lb 12.8 oz (98.8 kg) IBW/kg (Calculated) : 66.2 Heparin Dosing Weight: 87.7 kg  Vital Signs: Temp: 97.4 F (36.3 C) (11/07 0352) Temp Source: Oral (11/07 0352) BP: 132/68 (11/07 0352) Pulse Rate: 91 (11/07 0402)  Labs: Recent Labs    10/26/18 2154 10/27/18 0201 10/27/18 0436  10/27/18 1705 10/28/18 0405 10/29/18 0342  HGB 13.2  --  11.7*  --   --  12.9  --   HCT 41.5  --  37.2  --   --  41.4  --   PLT 225  --  188  --   --  144*  --   APTT  --  39*  --   --   --   --   --   LABPROT 16.6* 16.7* 17.9*  --   --  17.5* 17.1*  INR 1.36 1.37 1.50  --   --  1.45 1.41  HEPARINUNFRC  --   --   --    < > 0.60 0.48 0.26*  CREATININE 1.15*  --  1.12*  --   --  0.83 0.60   < > = values in this interval not displayed.    Estimated Creatinine Clearance: 80.6 mL/min (by C-G formula based on SCr of 0.6 mg/dL).   Medical History: Past Medical History:  Diagnosis Date  . Acute diastolic heart failure ([place])   . Allergy   . 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 Asthma   . Atrial fibrillation ([place])   . Backache, unspecified   . Cancer ([place])    skin  . Cardiomegaly   . COPD (chronic obstructive pulmonary disease) ([place])   . Diabetes mellitus without complication ([place])   . 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 Esophageal reflux   . Headache(784.0)   . Herpes zoster without mention of complication   . Hx: UTI (urinary tract infection)   . Hypertension    heart controlled w CHF  . Nontoxic uninodular goiter   . Obesity, unspecified   .  Osteoarthrosis, unspecified whether generalized or localized, unspecified site   . Unspecified sleep apnea   . Urine incontinence    hx of    Medications:  Scheduled:  . atorvastatin  20 mg Oral Daily  . gabapentin  300 mg Oral Q1200  . gabapentin  600 mg Oral BID  . heparin  1,300 Units Intravenous Once  . insulin aspart  0-5 Units Subcutaneous QHS  . insulin aspart  0-9 Units Subcutaneous TID WC  . pantoprazole  40 mg Oral Daily  . senna  1 tablet Oral Daily  . venlafaxine XR  150 mg Oral Daily  . warfarin  8 mg Oral q1800  . Warfarin - Pharmacist Dosing Inpatient   Does not apply q1800    Assessment: Patient admitted for SOB s/t to pneumonia was called a code sepsis. Patient has as h/o afib and has a mechanical mitral valve. Patient takes warfarin 7mg  Monday - Friday and warfarin 8mg  Saturday and Sunday. Patient unsure of last dose. Patient currently being bridged with heparin at 1250 units/hr,  will continue. Patient is currently receiving Merrem, but did receive Cefepime and Vancomycin 11/5. Only interaction is increase of INR with Cefepime.   Goal of Therapy:  INR 2.5 - 3.5 Monitor platelets by anticoagulation protocol: Yes   Plan:  11/07 @ 0400 HL 0.26 subtherapeutic. Will rebolus w/ heparin 1300 units IV x 1 and increase rate to 1400 units/hr and will recheck HL @ 1300. CBC stable will continue to monitor.  Tobie Lords, PharmD, BCPS Clinical Pharmacist 10/29/2018

## 2018-10-29 NOTE — Progress Notes (Signed)
ANTICOAGULATION CONSULT NOTE -  Pharmacy Consult for warfarin/heparin bridge Indication: atrial fibrillation/mechanical mitral valve  Allergies  Allergen Reactions  . Flecainide Shortness Of Breath and Other (See Comments)    Reaction: dizziness   . Amiodarone Other (See Comments)    Pt states that this medication causes lung bleeding.      Patient Measurements: Height: 5\' 9"  (175.3 cm) Weight: 217 lb 12.8 oz (98.8 kg) IBW/kg (Calculated) : 66.2 Heparin Dosing Weight: 87.7 kg  Vital Signs: Temp: 99 F (37.2 C) (11/07 0753) Temp Source: Oral (11/07 0753) BP: 121/63 (11/07 0753) Pulse Rate: 113 (11/07 0753)  Labs: Recent Labs    10/27/18 0201 10/27/18 0436  10/28/18 0405 10/29/18 0342 10/29/18 0412 10/29/18 1254 10/29/18 1915  HGB  --  11.7*  --  12.9  --  11.8*  --   --   HCT  --  37.2  --  41.4  --  37.2  --   --   PLT  --  188  --  144*  --  148*  --   --   APTT 39*  --   --   --   --   --   --   --   LABPROT 16.7* 17.9*  --  17.5* 17.1*  --   --   --   INR 1.37 1.50  --  1.45 1.41  --   --   --   HEPARINUNFRC  --   --    < > 0.48 0.26*  --  0.35 0.33  CREATININE  --  1.12*  --  0.83 0.60  --   --   --    < > = values in this interval not displayed.    Estimated Creatinine Clearance: 80.6 mL/min (by C-G formula based on SCr of 0.6 mg/dL).   Medical History: Past Medical History:  Diagnosis Date  . Acute diastolic heart failure ([place])   . Allergy   . ANCA-associated vasculitis ([employer])   . Asthma   . Atrial fibrillation ([place])   . Backache, unspecified   . Cancer ([place])    skin  . Cardiomegaly   . COPD (chronic obstructive pulmonary disease) ([place])   . Diabetes mellitus without complication ([place])   . 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 Esophageal reflux   . Headache(784.0)   . Herpes zoster without mention of complication   . Hx: UTI (urinary tract infection)   . Hypertension    heart controlled w CHF  . Nontoxic  uninodular goiter   . Obesity, unspecified   . Osteoarthrosis, unspecified whether generalized or localized, unspecified site   . Unspecified sleep apnea   . Urine incontinence    hx of    Medications:  Scheduled:  . atorvastatin  20 mg Oral Daily  . gabapentin  300 mg Oral Q1200  . gabapentin  600 mg Oral BID  . insulin aspart  0-5 Units Subcutaneous QHS  . insulin aspart  0-9 Units Subcutaneous TID WC  . pantoprazole  40 mg Oral Daily  . senna  1 tablet Oral Daily  . spironolactone  12.5 mg Oral Daily  . torsemide  80 mg Oral BID  . venlafaxine XR  150 mg Oral Daily  . warfarin  8 mg Oral q1800  . Warfarin - Pharmacist Dosing Inpatient   Does not apply q1800    Assessment: Patient admitted for SOB s/t to pneumonia was called a code sepsis.  Patient has as h/o afib and has a mechanical mitral valve. Patient takes warfarin 7mg  Monday - Friday and warfarin 8mg  Saturday and Sunday. Patient unsure of last dose. Patient currently being bridged with heparin at 1250 units/hr, will continue. Patient is currently receiving Merrem, but did receive Cefepime and Vancomycin 11/5. Only interaction is increase of INR with Cefepime.   11/5   INR 1.50     Warfarin 10.5mg  11/6   INR 1.45     Warfarin 8mg  (dose given? - didn't get documented on MAR) 11/7   INR 1.41     Warfarin 8mg   Goal of Therapy:  INR 2.5 - 3.5 Monitor platelets by anticoagulation protocol: Yes   Plan:  11/07 @ 1900 HL 0.33 therapeutic. Will continue with current rate and check another Heparin level tomorrow am. Daily CBC while on Heparin drip.  Will continue with Warfarin 8mg  today. Daily INR checks.  Lu Duffel, PharmD Clinical Pharmacist 10/29/2018 7:50 PM

## 2018-10-29 NOTE — Plan of Care (Signed)
  Problem: Education: Goal: Knowledge of General Education information will improve Description: Including pain rating scale, medication(s)/side effects and non-pharmacologic comfort measures Outcome: Progressing   Problem: Health Behavior/Discharge Planning: Goal: Ability to manage health-related needs will improve Outcome: Progressing   Problem: Safety: Goal: Ability to remain free from injury will improve Outcome: Progressing   

## 2018-10-29 NOTE — Progress Notes (Signed)
ANTICOAGULATION CONSULT NOTE -  Pharmacy Consult for warfarin/heparin bridge Indication: atrial fibrillation/mechanical mitral valve  Allergies  Allergen Reactions  . Flecainide Shortness Of Breath and Other (See Comments)    Reaction: dizziness   . Amiodarone Other (See Comments)    Pt states that this medication causes lung bleeding.      Patient Measurements: Height: 5\' 9"  (175.3 cm) Weight: 217 lb 12.8 oz (98.8 kg) IBW/kg (Calculated) : 66.2 Heparin Dosing Weight: 87.7 kg  Vital Signs: Temp: 99 F (37.2 C) (11/07 0753) Temp Source: Oral (11/07 0753) BP: 121/63 (11/07 0753) Pulse Rate: 113 (11/07 0753)  Labs: Recent Labs    10/27/18 0201 10/27/18 0436  10/28/18 0405 10/29/18 0342 10/29/18 0412 10/29/18 1254  HGB  --  11.7*  --  12.9  --  11.8*  --   HCT  --  37.2  --  41.4  --  37.2  --   PLT  --  188  --  144*  --  148*  --   APTT 39*  --   --   --   --   --   --   LABPROT 16.7* 17.9*  --  17.5* 17.1*  --   --   INR 1.37 1.50  --  1.45 1.41  --   --   HEPARINUNFRC  --   --    < > 0.48 0.26*  --  0.35  CREATININE  --  1.12*  --  0.83 0.60  --   --    < > = values in this interval not displayed.    Estimated Creatinine Clearance: 80.6 mL/min (by C-G formula based on SCr of 0.6 mg/dL).   Medical History: Past Medical History:  Diagnosis Date  . Acute diastolic heart failure ([place])   . Allergy   . 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 Asthma   . Atrial fibrillation ([place])   . Backache, unspecified   . Cancer ([place])    skin  . Cardiomegaly   . COPD (chronic obstructive pulmonary disease) ([place])   . Diabetes mellitus without complication ([place])   . 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 Esophageal reflux   . Headache(784.0)   . Herpes zoster without mention of complication   . Hx: UTI (urinary tract infection)   . Hypertension    heart controlled w CHF  . Nontoxic uninodular goiter   . Obesity, unspecified   .  Osteoarthrosis, unspecified whether generalized or localized, unspecified site   . Unspecified sleep apnea   . Urine incontinence    hx of    Medications:  Scheduled:  . atorvastatin  20 mg Oral Daily  . gabapentin  300 mg Oral Q1200  . gabapentin  600 mg Oral BID  . insulin aspart  0-5 Units Subcutaneous QHS  . insulin aspart  0-9 Units Subcutaneous TID WC  . pantoprazole  40 mg Oral Daily  . senna  1 tablet Oral Daily  . spironolactone  12.5 mg Oral Daily  . torsemide  80 mg Oral BID  . venlafaxine XR  150 mg Oral Daily  . warfarin  8 mg Oral q1800  . Warfarin - Pharmacist Dosing Inpatient   Does not apply q1800    Assessment: Patient admitted for SOB s/t to pneumonia was called a code sepsis. Patient has as h/o afib and has a mechanical mitral valve. Patient takes warfarin 7mg  Monday - Friday and warfarin 8mg  Saturday and Sunday.  Patient unsure of last dose. Patient currently being bridged with heparin at 1250 units/hr, will continue. Patient is currently receiving Merrem, but did receive Cefepime and Vancomycin 11/5. Only interaction is increase of INR with Cefepime.   11/5   INR 1.50     Warfarin 10.5mg  11/6   INR 1.45     Warfarin 8mg  11/7   INR 1.41  Goal of Therapy:  INR 2.5 - 3.5 Monitor platelets by anticoagulation protocol: Yes   Plan:  11/07 @ 1300 HL 0.35 therapeutic. Will continue with current rate and check another Heparin level in 6 hours for confirmation. Daily CBC while on Heparin drip.  Will continue with Warfarin 8mg  today. Daily INR checks.  Paulina Fusi, PharmD, BCPS 10/29/2018 3:13 PM

## 2018-10-29 NOTE — Progress Notes (Signed)
[place] [facility] [place] NAME: Betty [name]    MR#:  [id number]  DATE OF BIRTH:  01-Jan-1946  SUBJECTIVE:  CHIEF COMPLAINT:   Chief Complaint  Patient presents with  . Code Sepsis  . Fatigue  SOB & tachycardic today, having fever and chills, needing 4 liters O2, not feeling too well REVIEW OF SYSTEMS:  Review of Systems  Constitutional: Positive for chills and fever. Negative for diaphoresis, malaise/fatigue and weight loss.  HENT: Negative for ear discharge, ear pain, hearing loss, nosebleeds, sore throat and tinnitus.   Eyes: Negative for blurred vision and pain.  Respiratory: Positive for shortness of breath. Negative for cough, hemoptysis and wheezing.   Cardiovascular: Negative for chest pain, palpitations, orthopnea and leg swelling.  Gastrointestinal: Negative for abdominal pain, blood in stool, constipation, diarrhea, heartburn, nausea and vomiting.  Genitourinary: Negative for dysuria, frequency and urgency.  Musculoskeletal: Negative for back pain and myalgias.  Skin: Negative for itching and rash.  Neurological: Negative for dizziness, tingling, tremors, focal weakness, seizures, weakness and headaches.  Psychiatric/Behavioral: Negative for depression. The patient is not nervous/anxious.    DRUG ALLERGIES:   Allergies  Allergen Reactions  . Flecainide Shortness Of Breath and Other (See Comments)    Reaction: dizziness   . Amiodarone Other (See Comments)    Pt states that this medication causes lung bleeding.     VITALS:  Blood pressure 121/63, pulse (!) 113, temperature 99 F (37.2 C), temperature source Oral, resp. rate 18, height 5\' 9"  (1.753 m), weight 98.8 kg, SpO2 94 %. PHYSICAL EXAMINATION:  Physical Exam  HENT:  Head: Normocephalic and atraumatic.  Eyes: Pupils are equal, round, and reactive to light. Conjunctivae and EOM are normal.  Neck: Normal range of motion. Neck supple. No tracheal deviation present. No  thyromegaly present.  Cardiovascular: Normal rate, regular rhythm and normal heart sounds.  Pulmonary/Chest: Effort normal and breath sounds normal. No respiratory distress. She has no wheezes. She exhibits no tenderness.  Abdominal: Soft. Bowel sounds are normal. She exhibits no distension. There is no tenderness.  Musculoskeletal: Normal range of motion.  Neurological: She is alert. She is disoriented. No cranial nerve deficit.  Skin: Skin is warm and dry. No rash noted.  Psychiatric: Her affect is inappropriate.   LABORATORY PANEL:  Female CBC Recent Labs  Lab 10/29/18 0412  WBC 5.7  HGB 11.8*  HCT 37.2  PLT 148*   ------------------------------------------------------------------------------------------------------------------ Chemistries  Recent Labs  Lab 10/26/18 2154  10/29/18 0342  NA 134*   < > 138  K 3.4*   < > 3.6  CL 92*   < > 101  CO2 31   < > 29  GLUCOSE 161*   < > 163*  BUN 28*   < > 16  CREATININE 1.15*   < > 0.60  CALCIUM 8.8*   < > 8.8*  MG  --    < > 2.3  AST 39  --   --   ALT 21  --   --   ALKPHOS 109  --   --   BILITOT 1.2  --   --    < > = values in this interval not displayed.   RADIOLOGY:  No results found. ASSESSMENT AND PLAN:  40  y f admitted for sepsis due to UTI  * Klebsiella Sepsis ([place]) -present on admission due to UTI and possible pneumonia - continue meropenem and vanco - BCID + for Klebsiella  *  UTI (urinary tract infection) -IV antibiotics as above, culture sent  * Acute on chronic diastolic CHF - Resume Home dose torsemide (80 BID) and spironolactone (I discussed with her retail pharmacy)  * PAF (paroxysmal atrial fibrillation) ([place]) -continue home rate controlling medications and anticoagulation  * Mechanical mitral valve -patient has been on therapeutic Lovenox shots due to recent procedure, she is now transitioning back to her warfarin.  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 Monitor closely. May end up back in ICU if worsens   HHPT at D/C   All the records are reviewed and case discussed with Care Management/Social Worker. Management plans discussed with the patient, nursing and they are in agreement.  CODE STATUS: Full Code  TOTAL TIME (Critical Care) TAKING CARE OF THIS PATIENT: 35 minutes.   More than 50% of the time was spent in counseling/coordination of care: YES  POSSIBLE D/C IN 2-3 DAYS, DEPENDING ON CLINICAL CONDITION.   Max Sane M.D on 10/29/2018 at 5:28 PM  Between 7am to 6pm - Pager - [phone]  After 6pm go to [email or web address] - Proofreader  [facility]  Office  [phone]  CC: Primary care physician; Dion Body, MD  Note: This dictation was prepared with Dragon dictation along with smaller phrase technology. Any transcriptional errors that result from this process are unintentional.

## 2018-10-29 NOTE — Care Management Important Message (Signed)
Important Message  Patient Details  Name: Betty [name] MRN: [id number] Date of Birth: Dec 11, 1946   Medicare Important Message Given:  Yes    Elza Rafter, RN 10/29/2018, 3:25 PM

## 2018-10-30 LAB — CBC
HCT: 38.5 % (ref 36.0–46.0)
HEMOGLOBIN: 11.7 g/dL — AB (ref 12.0–15.0)
MCH: 29 pg (ref 26.0–34.0)
MCHC: 30.4 g/dL (ref 30.0–36.0)
MCV: 95.5 fL (ref 80.0–100.0)
PLATELETS: 178 10*3/uL (ref 150–400)
RBC: 4.03 MIL/uL (ref 3.87–5.11)
RDW: 16.8 % — ABNORMAL HIGH (ref 11.5–15.5)
WBC: 5.4 10*3/uL (ref 4.0–10.5)
nRBC: 0 % (ref 0.0–0.2)

## 2018-10-30 LAB — BLOOD GAS, ARTERIAL
Acid-Base Excess: 9.9 mmol/L — ABNORMAL HIGH (ref 0.0–2.0)
BICARBONATE: 36.3 mmol/L — AB (ref 20.0–28.0)
FIO2: 0.36
O2 SAT: 98.7 %
PATIENT TEMPERATURE: 37
PCO2 ART: 56 mmHg — AB (ref 32.0–48.0)
pH, Arterial: 7.42 (ref 7.350–7.450)
pO2, Arterial: 119 mmHg — ABNORMAL HIGH (ref 83.0–108.0)

## 2018-10-30 LAB — GLUCOSE, CAPILLARY
GLUCOSE-CAPILLARY: 129 mg/dL — AB (ref 70–99)
Glucose-Capillary: 201 mg/dL — ABNORMAL HIGH (ref 70–99)
Glucose-Capillary: 191 mg/dL — ABNORMAL HIGH (ref 70–99)
Glucose-Capillary: 167 mg/dL — ABNORMAL HIGH (ref 70–99)

## 2018-10-30 LAB — HEPARIN LEVEL (UNFRACTIONATED)
HEPARIN UNFRACTIONATED: 0.23 [IU]/mL — AB (ref 0.30–0.70)
HEPARIN UNFRACTIONATED: 0.52 [IU]/mL (ref 0.30–0.70)

## 2018-10-30 LAB — PROTIME-INR
INR: 1.34
Prothrombin Time: 16.4 seconds — ABNORMAL HIGH (ref 11.4–15.2)

## 2018-10-30 MED ORDER — HEPARIN BOLUS VIA INFUSION
1300.0000 [IU] | Freq: Once | INTRAVENOUS | Status: AC
  Administered 2018-10-30: 1300 [IU] via INTRAVENOUS
  Filled 2018-10-30: qty 1300

## 2018-10-30 MED ORDER — SODIUM CHLORIDE 0.9% FLUSH
3.0000 mL | Freq: Two times a day (BID) | INTRAVENOUS | Status: DC
  Administered 2018-10-30 – 2018-11-03 (×8): 3 mL via INTRAVENOUS

## 2018-10-30 NOTE — Progress Notes (Signed)
In to pt's room. Pt with cpap off. Attempted to place back on and pt stated that did not want back on. Pt placed back on [place] @ 4lilters. Will continue to monitor and assess.

## 2018-10-30 NOTE — Progress Notes (Signed)
I have reviewed the student nurse's assessment and agree with or have updated it.

## 2018-10-30 NOTE — Progress Notes (Signed)
ANTICOAGULATION CONSULT NOTE -  Pharmacy Consult for warfarin/heparin bridge Indication: atrial fibrillation/mechanical mitral valve  Allergies  Allergen Reactions  . Flecainide Shortness Of Breath and Other (See Comments)    Reaction: dizziness   . Amiodarone Other (See Comments)    Pt states that this medication causes lung bleeding.      Patient Measurements: Height: 5\' 9"  (175.3 cm) Weight: 210 lb 9.6 oz (95.5 kg) IBW/kg (Calculated) : 66.2 Heparin Dosing Weight: 87.7 kg  Vital Signs: Temp: 98.9 F (37.2 C) (11/08 0828) Temp Source: Oral (11/08 0828) BP: 137/81 (11/08 0828) Pulse Rate: 92 (11/08 0828)  Labs: Recent Labs    10/28/18 0405 10/29/18 0342 10/29/18 0412 10/29/18 1254 10/29/18 1915 10/30/18 0540  HGB 12.9  --  11.8*  --   --  11.7*  HCT 41.4  --  37.2  --   --  38.5  PLT 144*  --  148*  --   --  178  LABPROT 17.5* 17.1*  --   --   --  16.4*  INR 1.45 1.41  --   --   --  1.34  HEPARINUNFRC 0.48 0.26*  --  0.35 0.33 0.23*  CREATININE 0.83 0.60  --   --   --   --     Estimated Creatinine Clearance: 79.3 mL/min (by C-G formula based on SCr of 0.6 mg/dL).   Medical History: Past Medical History:  Diagnosis Date  . Acute diastolic heart failure ([place])   . Allergy   . 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 Asthma   . Atrial fibrillation ([place])   . Backache, unspecified   . Cancer ([place])    skin  . Cardiomegaly   . COPD (chronic obstructive pulmonary disease) ([place])   . Diabetes mellitus without complication ([place])   . 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 Esophageal reflux   . Headache(784.0)   . Herpes zoster without mention of complication   . Hx: UTI (urinary tract infection)   . Hypertension    heart controlled w CHF  . Nontoxic uninodular goiter   . Obesity, unspecified   . Osteoarthrosis, unspecified whether generalized or localized, unspecified site   . Unspecified sleep apnea   . Urine incontinence    hx of     Medications:  Scheduled:  . atorvastatin  20 mg Oral Daily  . gabapentin  300 mg Oral Q1200  . gabapentin  600 mg Oral BID  . heparin  1,300 Units Intravenous Once  . insulin aspart  0-5 Units Subcutaneous QHS  . insulin aspart  0-9 Units Subcutaneous TID WC  . pantoprazole  40 mg Oral Daily  . senna  1 tablet Oral Daily  . sodium chloride flush  3 mL Intravenous Q12H  . spironolactone  12.5 mg Oral Daily  . torsemide  80 mg Oral BID  . venlafaxine XR  150 mg Oral Daily  . warfarin  8 mg Oral q1800  . Warfarin - Pharmacist Dosing Inpatient   Does not apply q1800    Assessment: Patient admitted for SOB s/t to pneumonia was called a code sepsis. Patient has as h/o afib and has a mechanical mitral valve. Patient takes warfarin 7mg  Monday - Friday and warfarin 8mg  Saturday and Sunday. Patient unsure of last dose. Patient currently being bridged with heparin at 1250 units/hr, will continue. Patient is currently receiving Merrem, but did receive Cefepime and Vancomycin 11/5. Only interaction is increase of INR with  Cefepime.   11/5   INR 1.50     Warfarin 10.5mg  11/6   INR 1.45     Warfarin 8mg  (dose given? - didn't get documented on MAR) 11/7   INR 1.41     Warfarin 8mg  11/8   INR 1.34  Goal of Therapy:  INR 2.5 - 3.5 Monitor platelets by anticoagulation protocol: Yes   Plan:  11/08 @ 0540 HL 0.23 is subtherapeutic. Will order Heaprin 1300 units IV bolus and increase drip rate to 1550 units/hr. Will check a Heparin level at 17:30. Daily CBC while on Heparin drip.  INR is decreased today, suspect this is due to missed dose on 11/6. Will continue with Warfarin 8mg  today. Daily INR checks.  Paulina Fusi, PharmD, BCPS 10/30/2018 9:34 AM

## 2018-10-30 NOTE — Progress Notes (Signed)
Betty [name] at [facility] NAME: Betty [name]    MR#:  [id number]  DATE OF BIRTH:  1946/04/23  SUBJECTIVE:  CHIEF COMPLAINT:   Chief Complaint  Patient presents with  . Code Sepsis  . Fatigue  not feeling too well - still needing 4 liters O2 and more lethargic today REVIEW OF SYSTEMS:  Review of Systems  Constitutional: Positive for chills and fever. Negative for diaphoresis, malaise/fatigue and weight loss.  HENT: Negative for ear discharge, ear pain, hearing loss, nosebleeds, sore throat and tinnitus.   Eyes: Negative for blurred vision and pain.  Respiratory: Positive for shortness of breath. Negative for cough, hemoptysis and wheezing.   Cardiovascular: Negative for chest pain, palpitations, orthopnea and leg swelling.  Gastrointestinal: Negative for abdominal pain, blood in stool, constipation, diarrhea, heartburn, nausea and vomiting.  Genitourinary: Negative for dysuria, frequency and urgency.  Musculoskeletal: Negative for back pain and myalgias.  Skin: Negative for itching and rash.  Neurological: Negative for dizziness, tingling, tremors, focal weakness, seizures, weakness and headaches.  Psychiatric/Behavioral: Negative for depression. The patient is not nervous/anxious.    DRUG ALLERGIES:   Allergies  Allergen Reactions  . Flecainide Shortness Of Breath and Other (See Comments)    Reaction: dizziness   . Amiodarone Other (See Comments)    Pt states that this medication causes lung bleeding.     VITALS:  Blood pressure 124/67, pulse 84, temperature (P) 97.7 F (36.5 C), resp. rate 20, height 5\' 9"  (1.753 m), weight 95.5 kg, SpO2 90 %. PHYSICAL EXAMINATION:  Physical Exam  Constitutional: She appears lethargic.  HENT:  Head: Normocephalic and atraumatic.  Eyes: Pupils are equal, round, and reactive to light. Conjunctivae and EOM are normal.  Neck: Normal range of motion. Neck supple. No tracheal deviation present. No  thyromegaly present.  Cardiovascular: Normal rate, regular rhythm and normal heart sounds.  Pulmonary/Chest: Effort normal and breath sounds normal. No respiratory distress. She has no wheezes. She exhibits no tenderness.  Abdominal: Soft. Bowel sounds are normal. She exhibits no distension. There is no tenderness.  Musculoskeletal: Normal range of motion.  Neurological: She appears lethargic. She is not disoriented. No cranial nerve deficit.  Skin: Skin is warm and dry. No rash noted.  Psychiatric: Her affect is inappropriate.   LABORATORY PANEL:  Female CBC Recent Labs  Lab 10/30/18 0540  WBC 5.4  HGB 11.7*  HCT 38.5  PLT 178   ------------------------------------------------------------------------------------------------------------------ Chemistries  Recent Labs  Lab 10/26/18 2154  10/29/18 0342  NA 134*   < > 138  K 3.4*   < > 3.6  CL 92*   < > 101  CO2 31   < > 29  GLUCOSE 161*   < > 163*  BUN 28*   < > 16  CREATININE 1.15*   < > 0.60  CALCIUM 8.8*   < > 8.8*  MG  --    < > 2.3  AST 39  --   --   ALT 21  --   --   ALKPHOS 109  --   --   BILITOT 1.2  --   --    < > = values in this interval not displayed.   RADIOLOGY:  No results found. ASSESSMENT AND PLAN:  57  y f admitted for sepsis due to UTI  * Klebsiella Sepsis (Betty [name]) -present on admission due to UTI and possible pneumonia - change meropenem and vanco to Rocephin - BCID +  for Klebsiella  * UTI (urinary tract infection) -IV Rocephin for now  * Acute on chronic diastolic CHF - continue Home dose torsemide (80 BID) and spironolactone (I confirmed dose with her retail pharmacy)  * PAF (paroxysmal atrial fibrillation) (Betty [name]) -continue home rate controlling medications and anticoagulation  * Mechanical mitral valve -patient has been on therapeutic heparin due to recent procedure, she is now transitioning back to her warfarin.  Continue therapeutic heparin dose for now until her INR is therapeutic. INR 1.3 -  Pharmacy to monitor  * Hypokalemia - repleted and resolved  * Controlled type 2 diabetes mellitus without complication (HCC) -sliding scale insulin with corresponding glucose checks  * h/o hypertension -stable  * COPD (chronic obstructive pulmonary disease) (HCC) -home dose inhalers   * ANCA-associated vasculitis (Betty [name]) -continue home meds   HHPT at D/C   All the records are reviewed and case discussed with Care Management/Social Worker. Management plans discussed with the patient, nursing and they are in agreement.  CODE STATUS: Full Code  TOTAL TIME TAKING CARE OF THIS PATIENT: 35 minutes.   More than 50% of the time was spent in counseling/coordination of care: YES  POSSIBLE D/C IN 1-2 DAYS, DEPENDING ON CLINICAL CONDITION.   Max Sane M.D on 10/30/2018 at 12:44 PM  Between 7am to 6pm - Pager - [phone]  After 6pm go to [email or web address] - Proofreader  [facility]  Office  [phone]  CC: Primary care physician; Betty Body, MD  Note: This dictation was prepared with Dragon dictation along with smaller phrase technology. Any transcriptional errors that result from this process are unintentional.

## 2018-10-30 NOTE — Plan of Care (Signed)
  Problem: Clinical Measurements: Goal: Respiratory complications will improve Outcome: Progressing   Problem: Elimination: Goal: Will not experience complications related to urinary retention Outcome: Progressing   Problem: Skin Integrity: Goal: Risk for impaired skin integrity will decrease Outcome: Progressing

## 2018-10-30 NOTE — Progress Notes (Signed)
Called to the room by a visitor who said she couldn't wake Betty [name] up.  Gianne was sleeping in the recliner.  She responded somewhat to voice but woke up thoroughly with a trapezius  pinch.  She would be fully alert and then minutes later fell back to sleep.  She said she was just sleepy.  Told her we were getting her back to bed, she sai "just give me a chance", and then sleep asleep again.  Getting into the bed woke her more than anything else.  VS WNL.  CBG 191.   Will monitor closely but think she is just sleepy.

## 2018-10-30 NOTE — Progress Notes (Signed)
ANTICOAGULATION CONSULT NOTE -  Pharmacy Consult for warfarin/heparin bridge Indication: atrial fibrillation/mechanical mitral valve  Allergies  Allergen Reactions  . Flecainide Shortness Of Breath and Other (See Comments)    Reaction: dizziness   . Amiodarone Other (See Comments)    Pt states that this medication causes lung bleeding.      Patient Measurements: Height: 5\' 9"  (175.3 cm) Weight: 210 lb 9.6 oz (95.5 kg) IBW/kg (Calculated) : 66.2 Heparin Dosing Weight: 87.7 kg  Vital Signs: Temp: (P) 97.7 F (36.5 C) (11/08 1126) Temp Source: Oral (11/08 0828) BP: 124/67 (11/08 1126) Pulse Rate: 84 (11/08 1126)  Labs: Recent Labs    10/28/18 0405 10/29/18 0342 10/29/18 0412 10/29/18 1254 10/29/18 1915 10/30/18 0540  HGB 12.9  --  11.8*  --   --  11.7*  HCT 41.4  --  37.2  --   --  38.5  PLT 144*  --  148*  --   --  178  LABPROT 17.5* 17.1*  --   --   --  16.4*  INR 1.45 1.41  --   --   --  1.34  HEPARINUNFRC 0.48 0.26*  --  0.35 0.33 0.23*  CREATININE 0.83 0.60  --   --   --   --     Estimated Creatinine Clearance: 79.3 mL/min (by C-G formula based on SCr of 0.6 mg/dL).   Medical History: Past Medical History:  Diagnosis Date  . Acute diastolic heart failure ([place])   . Allergy   . ANCA-associated vasculitis ([place])   . Asthma   . Atrial fibrillation ([place])   . Backache, unspecified   . Cancer ([place])    skin  . Cardiomegaly   . COPD (chronic obstructive pulmonary disease) ([place])   . Diabetes mellitus without complication ([place])   . 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 Esophageal reflux   . Headache(784.0)   . Herpes zoster without mention of complication   . Hx: UTI (urinary tract infection)   . Hypertension    heart controlled w CHF  . Nontoxic uninodular goiter   . Obesity, unspecified   . Osteoarthrosis, unspecified whether generalized or localized, unspecified site   . Unspecified sleep apnea   . Urine incontinence    hx of    Medications:  Scheduled:  . atorvastatin  20 mg Oral Daily  . gabapentin  300 mg Oral Q1200  . gabapentin  600 mg Oral BID  . insulin aspart  0-5 Units Subcutaneous QHS  . insulin aspart  0-9 Units Subcutaneous TID WC  . pantoprazole  40 mg Oral Daily  . senna  1 tablet Oral Daily  . sodium chloride flush  3 mL Intravenous Q12H  . spironolactone  12.5 mg Oral Daily  . torsemide  80 mg Oral BID  . venlafaxine XR  150 mg Oral Daily  . warfarin  8 mg Oral q1800  . Warfarin - Pharmacist Dosing Inpatient   Does not apply q1800    Assessment: Patient admitted for SOB s/t to pneumonia was called a code sepsis. Patient has as h/o afib and has a mechanical mitral valve. Patient takes warfarin 7mg  Monday - Friday and warfarin 8mg  Saturday and Sunday. Patient unsure of last dose. Patient currently being bridged with heparin at 1250 units/hr, will continue. Patient is currently receiving Merrem, but did receive Cefepime and Vancomycin 11/5. Only interaction is increase of INR with Cefepime.   11/5   INR 1.50  Warfarin 10.5mg  11/6   INR 1.45     Warfarin 8mg  (dose given? - didn't get documented on MAR) 11/7   INR 1.41     Warfarin 8mg  11/8   INR 1.34     Warfarin 8 mg  Goal of Therapy:  INR 2.5 - 3.5 Monitor platelets by anticoagulation protocol: Yes   Heparin Course: Initiated at 1250 units/hr + 4000 units bolus 11/05 0902 HL 0.65 11/05 1705 HL 0.60  11/06 0405 HL 0.48 11/07 0342 HL 0.26 1300 unit bolus then increase to 1400 units/hr 11/07 1254 HL 0.35 11/07 1915 HL 0.33 11/08 0540 HL 0.23: 1300 units IV bolus, then increase to 1550 units/hr 11/08 1715 HL 0.52   Plan:  Maintain current rate. Will check a Heparin level at midnight.  Daily CBC while on Heparin drip.  Daily INR checks.  Vallery Sa, PharmD 10/30/2018 2:12 PM

## 2018-10-31 LAB — CBC
HEMATOCRIT: 38.8 % (ref 36.0–46.0)
Hemoglobin: 12.2 g/dL (ref 12.0–15.0)
MCH: 29 pg (ref 26.0–34.0)
MCHC: 31.4 g/dL (ref 30.0–36.0)
MCV: 92.2 fL (ref 80.0–100.0)
Platelets: 184 10*3/uL (ref 150–400)
RBC: 4.21 MIL/uL (ref 3.87–5.11)
RDW: 16.3 % — ABNORMAL HIGH (ref 11.5–15.5)
WBC: 6.3 10*3/uL (ref 4.0–10.5)
nRBC: 0 % (ref 0.0–0.2)

## 2018-10-31 LAB — BASIC METABOLIC PANEL
Anion gap: 13 (ref 5–15)
BUN: 20 mg/dL (ref 8–23)
CO2: 33 mmol/L — AB (ref 22–32)
CREATININE: 0.63 mg/dL (ref 0.44–1.00)
Calcium: 8.5 mg/dL — ABNORMAL LOW (ref 8.9–10.3)
Chloride: 93 mmol/L — ABNORMAL LOW (ref 98–111)
GFR calc Af Amer: >60 mL/min (ref >60)
GFR calc non Af Amer: >60 mL/min (ref >60)
Glucose, Bld: 183 mg/dL — ABNORMAL HIGH (ref 70–99)
Potassium: 3.2 mmol/L — ABNORMAL LOW (ref 3.5–5.1)
SODIUM: 139 mmol/L (ref 135–145)

## 2018-10-31 LAB — GLUCOSE, CAPILLARY
GLUCOSE-CAPILLARY: 298 mg/dL — AB (ref 70–99)
Glucose-Capillary: 138 mg/dL — ABNORMAL HIGH (ref 70–99)
Glucose-Capillary: 162 mg/dL — ABNORMAL HIGH (ref 70–99)
Glucose-Capillary: 233 mg/dL — ABNORMAL HIGH (ref 70–99)
Glucose-Capillary: 231 mg/dL — ABNORMAL HIGH (ref 70–99)

## 2018-10-31 LAB — HEPARIN LEVEL (UNFRACTIONATED)
HEPARIN UNFRACTIONATED: 0.36 [IU]/mL (ref 0.30–0.70)
Heparin Unfractionated: 0.42 IU/mL (ref 0.30–0.70)

## 2018-10-31 LAB — MAGNESIUM: Magnesium: 1.6 mg/dL — ABNORMAL LOW (ref 1.7–2.4)

## 2018-10-31 LAB — PROTIME-INR
INR: 1.66
Prothrombin Time: 19.4 seconds — ABNORMAL HIGH (ref 11.4–15.2)

## 2018-10-31 MED ORDER — POTASSIUM CHLORIDE CRYS ER 20 MEQ PO TBCR
40.0000 meq | EXTENDED_RELEASE_TABLET | Freq: Every day | ORAL | Status: DC
  Filled 2018-10-31: qty 2

## 2018-10-31 MED ORDER — WARFARIN SODIUM 6 MG PO TABS
7.0000 mg | ORAL_TABLET | ORAL | Status: DC
  Administered 2018-11-02: 7 mg via ORAL
  Filled 2018-10-31 (×2): qty 1

## 2018-10-31 MED ORDER — POTASSIUM CHLORIDE CRYS ER 20 MEQ PO TBCR
40.0000 meq | EXTENDED_RELEASE_TABLET | ORAL | Status: AC
  Administered 2018-10-31 (×2): 40 meq via ORAL
  Filled 2018-10-31 (×2): qty 2

## 2018-10-31 MED ORDER — WARFARIN SODIUM 4 MG PO TABS
8.0000 mg | ORAL_TABLET | ORAL | Status: DC
  Administered 2018-10-31: 8 mg via ORAL
  Filled 2018-10-31 (×2): qty 2

## 2018-10-31 MED ORDER — MAGNESIUM SULFATE 4 GM/100ML IV SOLN
4.0000 g | Freq: Once | INTRAVENOUS | Status: AC
  Administered 2018-10-31: 4 g via INTRAVENOUS
  Filled 2018-10-31: qty 100

## 2018-10-31 MED ORDER — ROPINIROLE HCL 1 MG PO TABS
2.0000 mg | ORAL_TABLET | Freq: Every day | ORAL | Status: DC
  Administered 2018-10-31 – 2018-11-02 (×3): 2 mg via ORAL
  Filled 2018-10-31 (×3): qty 2

## 2018-10-31 NOTE — Progress Notes (Signed)
[place] at [employer] [facility] [employer] NAME: Kathia Covington    MR#:  [id number]  DATE OF BIRTH:  [birth year]  SUBJECTIVE:  CHIEF COMPLAINT:   Chief Complaint  Patient presents with  . Code Sepsis  . Fatigue  Alert, awake, oriented, sitting in the chair, O2 sats 99% on 2 L so told the nurse to wean off oxygen.  Son at bedside.  Patient denies any chest pain or shortness of breath. REVIEW OF SYSTEMS:  Review of Systems  Constitutional: Negative for chills, diaphoresis, fever, malaise/fatigue and weight loss.  HENT: Negative for ear discharge, ear pain, hearing loss, nosebleeds, sore throat and tinnitus.   Eyes: Negative for blurred vision and pain.  Respiratory: Negative for cough, hemoptysis, shortness of breath and wheezing.   Cardiovascular: Negative for chest pain, palpitations, orthopnea and leg swelling.  Gastrointestinal: Negative for abdominal pain, blood in stool, constipation, diarrhea, heartburn, nausea and vomiting.  Genitourinary: Negative for dysuria, frequency and urgency.  Musculoskeletal: Negative for back pain and myalgias.  Skin: Negative for itching and rash.  Neurological: Negative for dizziness, tingling, tremors, focal weakness, seizures, weakness and headaches.  Psychiatric/Behavioral: Negative for depression. The patient is not nervous/anxious.    DRUG ALLERGIES:   Allergies  Allergen Reactions  . Flecainide Shortness Of Breath and Other (See Comments)    Reaction: dizziness   . Amiodarone Other (See Comments)    Pt states that this medication causes lung bleeding.     VITALS:  Blood pressure 126/75, pulse 87, temperature 98.4 F (36.9 C), temperature source Oral, resp. rate 19, height 5\' 9"  (1.753 m), weight 95 kg, SpO2 99 %. PHYSICAL EXAMINATION:  Physical Exam  HENT:  Head: Normocephalic and atraumatic.  Eyes: Pupils are equal, round, and reactive to light. Conjunctivae and EOM are normal.  Neck: Normal range of motion.  Neck supple. No tracheal deviation present. No thyromegaly present.  Cardiovascular: Normal rate, regular rhythm and normal heart sounds.  Pulmonary/Chest: Effort normal and breath sounds normal. No respiratory distress. She has no wheezes. She exhibits no tenderness.  Abdominal: Soft. Bowel sounds are normal. She exhibits no distension. There is no tenderness.  Musculoskeletal: Normal range of motion.  Neurological: She is not disoriented. No cranial nerve deficit.  Skin: Skin is warm and dry. No rash noted.  Psychiatric: Her affect is not inappropriate.   LABORATORY PANEL:  Female CBC Recent Labs  Lab 10/31/18 0030  WBC 6.3  HGB 12.2  HCT 38.8  PLT 184   ------------------------------------------------------------------------------------------------------------------ Chemistries  Recent Labs  Lab 10/26/18 2154  10/31/18 0030  NA 134*   < > 139  K 3.4*   < > 3.2*  CL 92*   < > 93*  CO2 31   < > 33*  GLUCOSE 161*   < > 183*  BUN 28*   < > 20  CREATININE 1.15*   < > 0.63  CALCIUM 8.8*   < > 8.5*  MG  --    < > 1.6*  AST 39  --   --   ALT 21  --   --   ALKPHOS 109  --   --   BILITOT 1.2  --   --    < > = values in this interval not displayed.   RADIOLOGY:  No results found. ASSESSMENT AND PLAN:  75  y f admitted for sepsis due to UTI  * Klebsiella Sepsis ([place]) -present on admission due to UTI and possible  pneumonia - change meropenem and vanco to Rocephin - BCID + for Klebsiella Rpt blood cultures  * UTI (urinary tract infection) -IV Rocephin for now  * Acute on chronic diastolic CHF - continue Home dose torsemide (80 BID) and spironolactone (I confirmed dose with her retail pharmacy) Has hypokalemia due to diuretics, placed the potassium.  * PAF (paroxysmal atrial fibrillation) (HCC) -continue home rate controlling medications and anticoagulation  * Mechanical mitral valve -patient has been on therapeutic heparin due to recent procedure, she is now  transitioning back to her warfarin.    INR today is 1.6.  Continue bridging with heparin.  Discussed with patient and patient's son at bedside.- Pharmacy to monitor  * Hypokalemia - repleted and resolved  * Controlled type 2 diabetes mellitus without complication (HCC) -sliding scale insulin with corresponding glucose checks  * h/o hypertension -stable  * COPD (chronic obstructive pulmonary disease) ([place]) -home dose inhalers   * ANCA-associated vasculitis ([place]) -continue home meds   HHPT at D/C Likely discharge home on Monday if INR is more than 2  All the records are reviewed and case discussed with Care Management/Social Worker. Management plans discussed with the patient, nursing and they are in agreement.  CODE STATUS: Full Code  TOTAL TIME TAKING CARE OF THIS PATIENT: 35 minutes.   More than 50% of the time was spent in counseling/coordination of care: YES  POSSIBLE D/C IN 1-2 DAYS, DEPENDING ON CLINICAL CONDITION.   Epifanio Lesches M.D on 10/31/2018 at 1:31 PM  Between 7am to 6pm - Pager - [phone]  After 6pm go to [email or web address] - Proofreader  [facility]  Office  [phone]  CC: Primary care physician; Dion Body, MD  Note: This dictation was prepared with Dragon dictation along with smaller phrase technology. Any transcriptional errors that result from this process are unintentional.

## 2018-10-31 NOTE — Plan of Care (Signed)

## 2018-10-31 NOTE — Progress Notes (Addendum)
ANTICOAGULATION CONSULT NOTE -  Pharmacy Consult for warfarin/heparin bridge Indication: atrial fibrillation/mechanical mitral valve  Allergies  Allergen Reactions  . Flecainide Shortness Of Breath and Other (See Comments)    Reaction: dizziness   . Amiodarone Other (See Comments)    Pt states that this medication causes lung bleeding.      Patient Measurements: Height: 5\' 9"  (175.3 cm) Weight: 209 lb 7 oz (95 kg) IBW/kg (Calculated) : 66.2 Heparin Dosing Weight: 87.7 kg  Vital Signs: Temp: 98.4 F (36.9 C) (11/09 0800) Temp Source: Oral (11/09 0800) BP: 126/75 (11/09 0800) Pulse Rate: 87 (11/09 0800)  Labs: Recent Labs    10/29/18 0342  10/29/18 0412  10/30/18 0540 10/30/18 1715 10/31/18 0030  HGB  --    < > 11.8*  --  11.7*  --  12.2  HCT  --   --  37.2  --  38.5  --  38.8  PLT  --   --  148*  --  178  --  184  LABPROT 17.1*  --   --   --  16.4*  --  19.4*  INR 1.41  --   --   --  1.34  --  1.66  HEPARINUNFRC 0.26*  --   --    < > 0.23* 0.52 0.42  CREATININE 0.60  --   --   --   --   --  0.63   < > = values in this interval not displayed.    Estimated Creatinine Clearance: 79.1 mL/min (by C-G formula based on SCr of 0.63 mg/dL).   Medical History: Past Medical History:  Diagnosis Date  . Acute diastolic heart failure ([place])   . Allergy   . ANCA-associated vasculitis ([place])   . Asthma   . Atrial fibrillation ([place])   . Backache, unspecified   . Cancer ([place])    skin  . Cardiomegaly   . COPD (chronic obstructive pulmonary disease) ([place])   . Diabetes mellitus without complication ([place])   . 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 Esophageal reflux   . Headache(784.0)   . Herpes zoster without mention of complication   . Hx: UTI (urinary tract infection)   . Hypertension    heart controlled w CHF  . Nontoxic uninodular goiter   . Obesity, unspecified   . Osteoarthrosis, unspecified whether generalized or localized, unspecified  site   . Unspecified sleep apnea   . Urine incontinence    hx of    Medications:  Scheduled:  . atorvastatin  20 mg Oral Daily  . gabapentin  300 mg Oral Q1200  . gabapentin  600 mg Oral BID  . insulin aspart  0-5 Units Subcutaneous QHS  . insulin aspart  0-9 Units Subcutaneous TID WC  . pantoprazole  40 mg Oral Daily  . potassium chloride  40 mEq Oral Q4H  . [START ON 11/01/2018] potassium chloride  40 mEq Oral Daily  . senna  1 tablet Oral Daily  . sodium chloride flush  3 mL Intravenous Q12H  . spironolactone  12.5 mg Oral Daily  . torsemide  80 mg Oral BID  . venlafaxine XR  150 mg Oral Daily  . [START ON 11/02/2018] warfarin  7 mg Oral Once per day on Mon Tue Wed Thu Fri  . warfarin  8 mg Oral Once per day on Sun Sat  . Warfarin - Pharmacist Dosing Inpatient   Does not apply [id number]  Assessment: Patient admitted for SOB s/t to pneumonia was called a code sepsis. Patient has as h/o afib and has a mechanical mitral valve. Patient takes warfarin 7mg  Monday - Friday and warfarin 8mg  Saturday and Sunday. Patient unsure of last dose. Patient currently being bridged with heparin at 1250 units/hr, will continue. Patient is currently receiving Merrem, but did receive Cefepime and Vancomycin 11/5. Only interaction is increase of INR with Cefepime.   11/5   INR 1.50     Warfarin 10.5mg  11/6   INR 1.45     Warfarin 8mg  (dose given? - didn't get documented on MAR) 11/7   INR 1.41     Warfarin 8mg  11/8   INR 1.34     Warfarin 8mg  11/9   INR 1.66   Goal of Therapy:  INR 2.5 - 3.5 Monitor platelets by anticoagulation protocol: Yes   Plan:  INR is up to 1.66 this AM. Will continue with home dose which will give pt 8mg  tonight. Heparin level 0.36 continue at same rate and check in AM  Mckinnon Glick D Breylen Agyeman, Pharm.D, BCPS Clinical Pharmacist 10/31/2018 9:02 AM

## 2018-10-31 NOTE — Progress Notes (Signed)
Talked to patient and educated about the importance of her using her CPAP tonight. Patient is agreeable to wear CPAP tonight, RN to call respiratory when patient is ready. RN will continue to monitor.

## 2018-10-31 NOTE — Progress Notes (Addendum)
Pharmacy Electrolyte Monitoring Consult:  Pharmacy consulted to assist in monitoring and replacing electrolytes in this 72 y.o. female admitted on 10/26/2018 with atrial fibrillation. Patient transferred out of ICU on 11/6.   Labs:  Sodium (mmol/L)  Date Value  10/31/2018 139   Potassium (mmol/L)  Date Value  10/31/2018 3.2 (L)  09/27/2014 3.7   Magnesium (mg/dL)  Date Value  10/31/2018 1.6 (L)   Phosphorus (mg/dL)  Date Value  03/01/2018 2.4 (L)   Calcium (mg/dL)  Date Value  10/31/2018 8.5 (L)   Albumin (g/dL)  Date Value  10/26/2018 4.1    Assessment/Plan:  Electrolytes:  Will replace for goal potassium ~4 and goal magnesium ~ 2.  K and Mg low- pt is on torsemide 80mg  BID  Will give Mg IV 4g once and KCL 40 MEQ q 4 hr x 2 doses I will start KCL 40 MEQ daily starting tomorrow  Will check BMP/Magnesium with am labs.   Pharmacy will continue to monitor and adjust per consult.    Ramond Dial, Pharm.D, BCPS Clinical Pharmacist 10/31/2018 8:49 AM

## 2018-11-01 ENCOUNTER — Inpatient Hospital Stay: Payer: Medicare Other

## 2018-11-01 ENCOUNTER — Encounter: Payer: Self-pay | Admitting: Radiology

## 2018-11-01 LAB — CBC
HEMATOCRIT: 41.3 % (ref 36.0–46.0)
Hemoglobin: 12.9 g/dL (ref 12.0–15.0)
MCH: 28.8 pg (ref 26.0–34.0)
MCHC: 31.2 g/dL (ref 30.0–36.0)
MCV: 92.2 fL (ref 80.0–100.0)
Platelets: 229 10*3/uL (ref 150–400)
RBC: 4.48 MIL/uL (ref 3.87–5.11)
RDW: 15.9 % — AB (ref 11.5–15.5)
WBC: 7.6 10*3/uL (ref 4.0–10.5)
nRBC: 0 % (ref 0.0–0.2)

## 2018-11-01 LAB — MAGNESIUM: Magnesium: 2.2 mg/dL (ref 1.7–2.4)

## 2018-11-01 LAB — BASIC METABOLIC PANEL
Anion gap: 9 (ref 5–15)
BUN: 21 mg/dL (ref 8–23)
CALCIUM: 8.6 mg/dL — AB (ref 8.9–10.3)
CO2: 35 mmol/L — ABNORMAL HIGH (ref 22–32)
CREATININE: 0.74 mg/dL (ref 0.44–1.00)
Chloride: 95 mmol/L — ABNORMAL LOW (ref 98–111)
GFR calc Af Amer: >60 mL/min (ref >60)
GLUCOSE: 195 mg/dL — AB (ref 70–99)
Potassium: 3.5 mmol/L (ref 3.5–5.1)
Sodium: 139 mmol/L (ref 135–145)

## 2018-11-01 LAB — GLUCOSE, CAPILLARY
Glucose-Capillary: 179 mg/dL — ABNORMAL HIGH (ref 70–99)
Glucose-Capillary: 298 mg/dL — ABNORMAL HIGH (ref 70–99)
Glucose-Capillary: 255 mg/dL — ABNORMAL HIGH (ref 70–99)
Glucose-Capillary: 187 mg/dL — ABNORMAL HIGH (ref 70–99)

## 2018-11-01 LAB — PROTIME-INR
INR: 2.15
PROTHROMBIN TIME: 23.7 s — AB (ref 11.4–15.2)

## 2018-11-01 LAB — HEPARIN LEVEL (UNFRACTIONATED): Heparin Unfractionated: 0.38 IU/mL (ref 0.30–0.70)

## 2018-11-01 MED ORDER — METOPROLOL TARTRATE 25 MG PO TABS
25.0000 mg | ORAL_TABLET | Freq: Two times a day (BID) | ORAL | Status: DC
  Administered 2018-11-01 – 2018-11-03 (×5): 25 mg via ORAL
  Filled 2018-11-01 (×5): qty 1

## 2018-11-01 MED ORDER — WARFARIN SODIUM 4 MG PO TABS: 8.0000 mg | ORAL_TABLET | ORAL | Status: DC

## 2018-11-01 MED ORDER — IOPAMIDOL (ISOVUE-300) INJECTION 61%
100.0000 mL | Freq: Once | INTRAVENOUS | Status: AC | PRN
  Administered 2018-11-01: 100 mL via INTRAVENOUS

## 2018-11-01 MED ORDER — IOPAMIDOL (ISOVUE-300) INJECTION 61%
15.0000 mL | INTRAVENOUS | Status: AC
  Administered 2018-11-01 (×2): 15 mL via ORAL

## 2018-11-01 MED ORDER — IOPAMIDOL (ISOVUE-300) INJECTION 61%: 15.0000 mL | INTRAVENOUS | Status: DC

## 2018-11-01 MED ORDER — POTASSIUM CHLORIDE CRYS ER 20 MEQ PO TBCR
40.0000 meq | EXTENDED_RELEASE_TABLET | Freq: Two times a day (BID) | ORAL | Status: DC
  Administered 2018-11-01 – 2018-11-03 (×5): 40 meq via ORAL
  Filled 2018-11-01 (×4): qty 2

## 2018-11-01 MED ORDER — METFORMIN HCL 500 MG PO TABS
500.0000 mg | ORAL_TABLET | Freq: Two times a day (BID) | ORAL | Status: DC
  Administered 2018-11-02 – 2018-11-03 (×3): 500 mg via ORAL
  Filled 2018-11-01 (×3): qty 1

## 2018-11-01 NOTE — Progress Notes (Signed)
ANTICOAGULATION CONSULT NOTE -  Pharmacy Consult for warfarin/heparin bridge Indication: atrial fibrillation/mechanical mitral valve  Allergies  Allergen Reactions  . Flecainide Shortness Of Breath and Other (See Comments)    Reaction: dizziness   . Amiodarone Other (See Comments)    Pt states that this medication causes lung bleeding.      Patient Measurements: Height: 5\' 9"  (175.3 cm) Weight: 209 lb 7 oz (95 kg) IBW/kg (Calculated) : 66.2 Heparin Dosing Weight: 87.7 kg  Vital Signs: Temp: 98.4 F (36.9 C) (11/10 0517) Temp Source: Oral (11/10 0517) BP: 126/67 (11/10 0517) Pulse Rate: 91 (11/10 0517)  Labs: Recent Labs    10/30/18 0540  10/31/18 0030 10/31/18 1003 11/01/18 0436  HGB 11.7*  --  12.2  --  12.9  HCT 38.5  --  38.8  --  41.3  PLT 178  --  184  --  229  LABPROT 16.4*  --  19.4*  --  23.7*  INR 1.34  --  1.66  --  2.15  HEPARINUNFRC 0.23*   < > 0.42 0.36 0.38  CREATININE  --   --  0.63  --  0.74   < > = values in this interval not displayed.    Estimated Creatinine Clearance: 79.1 mL/min (by C-G formula based on SCr of 0.74 mg/dL).   Medical History: Past Medical History:  Diagnosis Date  . Acute diastolic heart failure ([place])   . Allergy   . ANCA-associated vasculitis ([place])   . Asthma   . Atrial fibrillation ([place])   . Backache, unspecified   . Cancer ([place])    skin  . Cardiomegaly   . COPD (chronic obstructive pulmonary disease) ([place])   . Diabetes mellitus without complication ([place])   . 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 Esophageal reflux   . Headache(784.0)   . Herpes zoster without mention of complication   . Hx: UTI (urinary tract infection)   . Hypertension    heart controlled w CHF  . Nontoxic uninodular goiter   . Obesity, unspecified   . Osteoarthrosis, unspecified whether generalized or localized, unspecified site   . Unspecified sleep apnea   . Urine incontinence    hx of    Medications:   Scheduled:  . atorvastatin  20 mg Oral Daily  . gabapentin  300 mg Oral Q1200  . gabapentin  600 mg Oral BID  . insulin aspart  0-5 Units Subcutaneous QHS  . insulin aspart  0-9 Units Subcutaneous TID WC  . pantoprazole  40 mg Oral Daily  . potassium chloride  40 mEq Oral Daily  . rOPINIRole  2 mg Oral QHS  . senna  1 tablet Oral Daily  . sodium chloride flush  3 mL Intravenous Q12H  . spironolactone  12.5 mg Oral Daily  . torsemide  80 mg Oral BID  . venlafaxine XR  150 mg Oral Daily  . [START ON 11/02/2018] warfarin  7 mg Oral Once per day on Mon Tue Wed Thu Fri  . warfarin  8 mg Oral Once per day on Sun Sat  . Warfarin - Pharmacist Dosing Inpatient   Does not apply q1800    Assessment: Patient admitted for SOB s/t to pneumonia was called a code sepsis. Patient has as h/o afib and has a mechanical mitral valve. Patient takes warfarin 7mg  Monday - Friday and warfarin 8mg  Saturday and Sunday. Patient unsure of last dose. Patient currently being bridged with heparin at  1250 units/hr, will continue. Patient is currently receiving Merrem, but did receive Cefepime and Vancomycin 11/5. Only interaction is increase of INR with Cefepime.   11/5   INR 1.50     Warfarin 10.5mg  11/6   INR 1.45     Warfarin 8mg  (dose given? - didn't get documented on MAR) 11/7   INR 1.41     Warfarin 8mg  11/8   INR 1.34     Warfarin 8mg  11/9   INR 1.66   Goal of Therapy:  INR 2.5 - 3.5 Monitor platelets by anticoagulation protocol: Yes   Plan:  11/10 @ 0500 HL 0.38 therapeutic. Will continue current rate and will recheck HL w/ am labs.  Tobie Lords, PharmD, BCPS Clinical Pharmacist 11/01/2018

## 2018-11-01 NOTE — Progress Notes (Addendum)
Dr. Vianne Bulls txt paged regarding Pelvis U.S. Results. Received verbal to hold Coumadin tonight, hold Heparin gtt at midnight, NPO after midnight, Kpad applied to left lower quadrant ecchymosis per MD order. Will continue to monitor and assess.

## 2018-11-01 NOTE — Plan of Care (Signed)

## 2018-11-01 NOTE — Progress Notes (Addendum)
[facility] [place] NAME: Lougenia Morrissey    MR#:  [id number]  DATE OF BIRTH:  09-Jan-1946  SUBJECTIVE:  CHIEF COMPLAINT:   Chief Complaint  Patient presents with  . Code Sepsis  . Fatigue  Alert, awake, oriented, sitting in the chair, O2 sats 99% on 2 L so told the nurse to wean off oxygen.  Son at bedside.  Patient denies any chest pain or shortness of breath. REVIEW OF SYSTEMS:  Review of Systems  Constitutional: Negative for chills, diaphoresis, fever, malaise/fatigue and weight loss.  HENT: Negative for ear discharge, ear pain, hearing loss, nosebleeds, sore throat and tinnitus.   Eyes: Negative for blurred vision and pain.  Respiratory: Negative for cough, hemoptysis, shortness of breath and wheezing.   Cardiovascular: Negative for chest pain, palpitations, orthopnea and leg swelling.  Gastrointestinal: Negative for abdominal pain, blood in stool, constipation, diarrhea, heartburn, nausea and vomiting.  Genitourinary: Negative for dysuria, frequency and urgency.  Musculoskeletal: Negative for back pain and myalgias.  Skin: Negative for itching and rash.  Neurological: Negative for dizziness, tingling, tremors, focal weakness, seizures, weakness and headaches.  Psychiatric/Behavioral: Negative for depression. The patient is not nervous/anxious.    DRUG ALLERGIES:   Allergies  Allergen Reactions  . Flecainide Shortness Of Breath and Other (See Comments)    Reaction: dizziness   . Amiodarone Other (See Comments)    Pt states that this medication causes lung bleeding.     VITALS:  Blood pressure (!) 116/57, pulse 91, temperature 98 F (36.7 C), temperature source Oral, resp. rate 18, height 5\' 9"  (1.753 m), weight 95 kg, SpO2 93 %. PHYSICAL EXAMINATION:  Physical Exam  HENT:  Head: Normocephalic and atraumatic.  Eyes: Pupils are equal, round, and reactive to light. Conjunctivae and EOM are normal.  Neck: Normal range of motion.  Neck supple. No tracheal deviation present. No thyromegaly present.  Cardiovascular: Normal rate, regular rhythm and normal heart sounds.  Pulmonary/Chest: Effort normal and breath sounds normal. No respiratory distress. She has no wheezes. She exhibits no tenderness.  Abdominal: Soft. Bowel sounds are normal. She exhibits no distension. There is no tenderness.  Musculoskeletal: Normal range of motion.  Neurological: She is not disoriented. No cranial nerve deficit.  Skin: Skin is warm and dry. No rash noted.  Psychiatric: Her affect is not inappropriate.   LABORATORY PANEL:  Female CBC Recent Labs  Lab 11/01/18 0436  WBC 7.6  HGB 12.9  HCT 41.3  PLT 229   ------------------------------------------------------------------------------------------------------------------ Chemistries  Recent Labs  Lab 10/26/18 2154  11/01/18 0436  NA 134*   < > 139  K 3.4*   < > 3.5  CL 92*   < > 95*  CO2 31   < > 35*  GLUCOSE 161*   < > 195*  BUN 28*   < > 21  CREATININE 1.15*   < > 0.74  CALCIUM 8.8*   < > 8.6*  MG  --    < > 2.2  AST 39  --   --   ALT 21  --   --   ALKPHOS 109  --   --   BILITOT 1.2  --   --    < > = values in this interval not displayed.   RADIOLOGY:  No results found. ASSESSMENT AND PLAN:  53  y f admitted for sepsis due to UTI  * Klebsiella Sepsis ([place]) -present on admission due to UTI and  possible pneumonia - change meropenem and vanco to Rocephin - BCID + for Klebsiella Rpt blood cultures are negative so far.  If cultures are negative for 48 hours discharge home tomorrow Cipro.  * UTI (urinary tract infection) -IV Rocephin for now  * Acute on chronic diastolic CHF - continue Home dose torsemide (80 BID) and spironolactone (I confirmed dose with her retail pharmacy) Has hypokalemia due to diuretics, continue to replace potassium, potassium better today 3.5 today.  * PAF (paroxysmal atrial fibrillation) (HCC) -continue home rate controlling medications and  anticoagulation   * Mechanical mitral valve -continue heparin drip until INR is 2.5.  Patient INR is close to goal, 2.15 today.  Complains of left lower quadrant ecchymosis, indurated area, patient complains of pain there, says that she took Lovenox injections before and now hurting a lot so we will do ultrasound of left lower quadrant, use heating pad.   * Hypokalemia - repleted and resolved  * Controlled type 2 diabetes mellitus without complication (HCC) -sliding scale insulin with corresponding glucose checks restart metformin today.  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   HHPT at D/C Likely discharge home on Monday if INR is more than 2.5, patient has mechanical mitral valve. All the records are reviewed and case discussed with Care Management/Social Worker. Management plans discussed with the patient, nursing and they are in agreement.  CODE STATUS: Full Code  TOTAL TIME TAKING CARE OF THIS PATIENT: 35 minutes.   More than 50% of the time was spent in counseling/coordination of care: YES  POSSIBLE D/C IN 1-2 DAYS, DEPENDING ON CLINICAL CONDITION.   Epifanio Lesches M.D on 11/01/2018 at 12:48 PM  Between 7am to 6pm - Pager - [phone]  After 6pm go to [email or web address] - Proofreader  [facility]  Office  [phone]  CC: Primary care physician; Dion Body, MD  Note: This dictation was prepared with Dragon dictation along with smaller phrase technology. Any transcriptional errors that result from this process are unintentional.

## 2018-11-01 NOTE — Progress Notes (Signed)
Pharmacy Electrolyte Monitoring Consult:  Pharmacy consulted to assist in monitoring and replacing electrolytes in this 72 y.o. female admitted on 10/26/2018 with atrial fibrillation. Patient transferred out of ICU on 11/6.   Labs:  Sodium (mmol/L)  Date Value  11/01/2018 139   Potassium (mmol/L)  Date Value  11/01/2018 3.5  09/27/2014 3.7   Magnesium (mg/dL)  Date Value  11/01/2018 2.2   Phosphorus (mg/dL)  Date Value  03/01/2018 2.4 (L)   Calcium (mg/dL)  Date Value  11/01/2018 8.6 (L)   Albumin (g/dL)  Date Value  10/26/2018 4.1    Assessment/Plan:  Electrolytes:  Will replace for goal potassium ~4 and goal magnesium ~ 2.  K and Mg low- pt is on torsemide 80mg  BID (PTA dose)  I will increase to KCL 40 MEQ BID (was on 20 BID @ home)  Will check BMP with AM labs   Pharmacy will continue to monitor and adjust per consult.    Ramond Dial, Pharm.D, BCPS Clinical Pharmacist 11/01/2018 8:26 AM

## 2018-11-01 NOTE — Progress Notes (Addendum)
ANTICOAGULATION CONSULT NOTE -  Pharmacy Consult for warfarin/heparin bridge Indication: atrial fibrillation/mechanical mitral valve  Allergies  Allergen Reactions  . Flecainide Shortness Of Breath and Other (See Comments)    Reaction: dizziness   . Amiodarone Other (See Comments)    Pt states that this medication causes lung bleeding.      Patient Measurements: Height: 5\' 9"  (175.3 cm) Weight: 209 lb 7 oz (95 kg) IBW/kg (Calculated) : 66.2 Heparin Dosing Weight: 87.7 kg  Vital Signs: Temp: 98 F (36.7 C) (11/10 0819) Temp Source: Oral (11/10 0819) BP: 116/57 (11/10 0819) Pulse Rate: 91 (11/10 0819)  Labs: Recent Labs    10/30/18 0540  10/31/18 0030 10/31/18 1003 11/01/18 0436  HGB 11.7*  --  12.2  --  12.9  HCT 38.5  --  38.8  --  41.3  PLT 178  --  184  --  229  LABPROT 16.4*  --  19.4*  --  23.7*  INR 1.34  --  1.66  --  2.15  HEPARINUNFRC 0.23*   < > 0.42 0.36 0.38  CREATININE  --   --  0.63  --  0.74   < > = values in this interval not displayed.    Estimated Creatinine Clearance: 79.1 mL/min (by C-G formula based on SCr of 0.74 mg/dL).   Medical History: Past Medical History:  Diagnosis Date  . Acute diastolic heart failure ([place])   . Allergy   . 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 Asthma   . Atrial fibrillation ([place])   . Backache, unspecified   . Cancer ([place])    skin  . Cardiomegaly   . COPD (chronic obstructive pulmonary disease) ([place])   . Diabetes mellitus without complication ([place])   . 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 Esophageal reflux   . Headache(784.0)   . Herpes zoster without mention of complication   . Hx: UTI (urinary tract infection)   . Hypertension    heart controlled w CHF  . Nontoxic uninodular goiter   . Obesity, unspecified   . Osteoarthrosis, unspecified whether generalized or localized, unspecified site   . Unspecified sleep apnea   . Urine incontinence    hx of    Medications:   Scheduled:  . atorvastatin  20 mg Oral Daily  . gabapentin  300 mg Oral Q1200  . gabapentin  600 mg Oral BID  . insulin aspart  0-5 Units Subcutaneous QHS  . insulin aspart  0-9 Units Subcutaneous TID WC  . pantoprazole  40 mg Oral Daily  . potassium chloride  40 mEq Oral Daily  . rOPINIRole  2 mg Oral QHS  . senna  1 tablet Oral Daily  . sodium chloride flush  3 mL Intravenous Q12H  . spironolactone  12.5 mg Oral Daily  . torsemide  80 mg Oral BID  . venlafaxine XR  150 mg Oral Daily  . [START ON 11/02/2018] warfarin  7 mg Oral Once per day on Mon Tue Wed Thu Fri  . warfarin  8 mg Oral Once per day on Sun Sat  . Warfarin - Pharmacist Dosing Inpatient   Does not apply q1800    Assessment: Patient admitted for SOB s/t to pneumonia was called a code sepsis. Patient has as h/o afib and has a mechanical mitral valve. Patient takes warfarin 7mg  Monday - Friday and warfarin 8mg  Saturday and Sunday. Patient unsure of last dose. Patient currently being bridged with heparin at  1250 units/hr, will continue. Patient is currently receiving Merrem, but did receive Cefepime and Vancomycin 11/5. Only interaction is increase of INR with Cefepime.   11/5   INR 1.50     Warfarin 10.5mg  11/6   INR 1.45     Warfarin 8mg  (dose given? - didn't get documented on MAR) 11/7   INR 1.41     Warfarin 8mg  11/8   INR 1.34     Warfarin 8mg  11/9   INR 1.66     Warfarin 8 mg 11/10 INR 2.15   Goal of Therapy:  INR 2.5 - 3.5 Monitor platelets by anticoagulation protocol: Yes   Plan:  INR is up to 2.15 this AM, very close to goal Will continue with home dose (warfarin 7mg  Monday - Friday and warfarin 8mg  Saturday and Sunday) which will give pt 8mg  tonight. Heparin level 0.38 continue at same rate and check in AM  Betty [name] Betty [name], Pharm.[name], BCPS Clinical Pharmacist 11/01/2018 8:24 AM

## 2018-11-02 DIAGNOSIS — Z515 Encounter for palliative care: Secondary | ICD-10-CM

## 2018-11-02 DIAGNOSIS — I776 Arteritis, unspecified: Secondary | ICD-10-CM

## 2018-11-02 DIAGNOSIS — R1032 Left lower quadrant pain: Secondary | ICD-10-CM

## 2018-11-02 DIAGNOSIS — Z7189 Other specified counseling: Secondary | ICD-10-CM

## 2018-11-02 DIAGNOSIS — I5032 Chronic diastolic (congestive) heart failure: Secondary | ICD-10-CM

## 2018-11-02 DIAGNOSIS — M7981 Nontraumatic hematoma of soft tissue: Secondary | ICD-10-CM

## 2018-11-02 LAB — BASIC METABOLIC PANEL
Anion gap: 9 (ref 5–15)
BUN: 20 mg/dL (ref 8–23)
CHLORIDE: 94 mmol/L — AB (ref 98–111)
CO2: 35 mmol/L — AB (ref 22–32)
Calcium: 9.3 mg/dL (ref 8.9–10.3)
Creatinine, Ser: 0.63 mg/dL (ref 0.44–1.00)
GFR calc non Af Amer: >60 mL/min (ref >60)
Glucose, Bld: 195 mg/dL — ABNORMAL HIGH (ref 70–99)
POTASSIUM: 4.5 mmol/L (ref 3.5–5.1)
SODIUM: 138 mmol/L (ref 135–145)

## 2018-11-02 LAB — GLUCOSE, CAPILLARY
GLUCOSE-CAPILLARY: 210 mg/dL — AB (ref 70–99)
Glucose-Capillary: 160 mg/dL — ABNORMAL HIGH (ref 70–99)
Glucose-Capillary: 210 mg/dL — ABNORMAL HIGH (ref 70–99)
Glucose-Capillary: 178 mg/dL — ABNORMAL HIGH (ref 70–99)

## 2018-11-02 LAB — CBC
HEMATOCRIT: 40.6 % (ref 36.0–46.0)
HEMOGLOBIN: 12.6 g/dL (ref 12.0–15.0)
MCH: 28.8 pg (ref 26.0–34.0)
MCHC: 31 g/dL (ref 30.0–36.0)
MCV: 92.7 fL (ref 80.0–100.0)
Platelets: 287 10*3/uL (ref 150–400)
RBC: 4.38 MIL/uL (ref 3.87–5.11)
RDW: 15.9 % — ABNORMAL HIGH (ref 11.5–15.5)
WBC: 8.8 10*3/uL (ref 4.0–10.5)
nRBC: 0 % (ref 0.0–0.2)

## 2018-11-02 LAB — PROTIME-INR
INR: 2.2
Prothrombin Time: 24.1 seconds — ABNORMAL HIGH (ref 11.4–15.2)

## 2018-11-02 MED ORDER — HYDROCORTISONE 1 % EX CREA
TOPICAL_CREAM | CUTANEOUS | Status: DC | PRN
  Administered 2018-11-02: 20:00:00 via TOPICAL
  Filled 2018-11-02: qty 28

## 2018-11-02 NOTE — Consult Note (Signed)
[facility] Vascular Consult Note  MRN : [id number]  Betty [name] is a 72 y.o. (July 06, 1946) female who presents with chief complaint of  Chief Complaint  Patient presents with  . Code Sepsis  . Fatigue   History of Present Illness:  The patient is a 72 year old female with a past medical history of congestive heart failure, ANCA associated vasculitis, atrial fibrillation, COPD, diabetes mellitus type 2, GERD, hypertension who was initially brought to the [facility] emergency department after being found by her son very "lethargic".  The patient was transferred to the emergency department per EMS was found to be febrile and tachycardic.  Patient was admitted due to urosepsis.  The patient does have a past medical history of paroxysmal atrial fibrillation and a mechanical mitral heart valve requiring chronic anticoagulation with Coumadin.    The patient endorses a history of experiencing left lower quadrant ecchymosis and discomfort.  Originally, the patient attributed her discomfort to the Lovenox shots (Lovenox bridge to Coumadin) however noted progressively worsening ecchymosis and discomfort to the area.  A limited abdominal ultrasound to CT of the abdomen and pelvis which was notable for "Diffuse calcification is seen along the abdominal aorta and its branches. The abdominal aorta is otherwise grossly unremarkable. The complex collection of fluid along the left lower quadrant anterior abdominal wall is interspersed about the subcutaneous fat and most likely reflects superficial soft tissue hemorrhage, given its appearance, measuring approximately 14.4 x 2.5 cm. No definite abscess is seen. Additional small foci of inflammation along the abdominal wall likely reflect injection sites."  The patient notes an improvement to the discomfort in this area.  No worsening in her ecchymosis. Denies any erythema surrounding the present ecchymosis.  Skin is  intact.  She denies any ulcerations.  She denies any claudication-like symptoms, rest pain or ulcer formation to the bilateral lower extremity.  Vascular surgery was consulted by Dr. Manuella Ghazi for additional recommendations.  Current Facility-Administered Medications  Medication Dose Route Frequency Provider Last Rate Last Dose  . 0.9 %  sodium chloride infusion   Intravenous PRN Max Sane, MD   Stopped at 10/29/18 1854  . acetaminophen (TYLENOL) tablet 650 mg  650 mg Oral Q6H PRN Lance Coon, MD   650 mg at 11/02/18 1208   Or  . acetaminophen (TYLENOL) suppository 650 mg  650 mg Rectal Q6H PRN Lance Coon, MD      . atorvastatin (LIPITOR) tablet 20 mg  20 mg Oral Daily Lance Coon, MD   20 mg at 11/01/18 1749  . cefTRIAXone (ROCEPHIN) 2 g in sodium chloride 0.9 % 100 mL IVPB  2 g Intravenous Q24H Max Sane, MD   Stopped at 11/01/18 1820  . gabapentin (NEURONTIN) capsule 300 mg  300 mg Oral Q1200 Max Sane, MD   300 mg at 11/02/18 1207  . gabapentin (NEURONTIN) tablet 600 mg  600 mg Oral BID Darel Hong D, NP   600 mg at 11/02/18 0839  . insulin aspart (novoLOG) injection 0-5 Units  0-5 Units Subcutaneous QHS Charlett Nose, [facility]   2 Units at 10/31/18 2112  . insulin aspart (novoLOG) injection 0-9 Units  0-9 Units Subcutaneous TID WC Charlett Nose, RPH   3 Units at 11/02/18 1208  . metFORMIN (GLUCOPHAGE) tablet 500 mg  500 mg Oral BID WC Epifanio Lesches, MD   500 mg at 11/02/18 0838  . metoprolol tartrate (LOPRESSOR) tablet 25 mg  25 mg Oral BID Epifanio Lesches, MD  25 mg at 11/02/18 0839  . MUSCLE RUB CREA   Topical PRN Bradly Bienenstock, NP      . pantoprazole (PROTONIX) EC tablet 40 mg  40 mg Oral Daily Lance Coon, MD   40 mg at 11/02/18 0840  . potassium chloride SA (K-DUR,KLOR-CON) CR tablet 40 mEq  40 mEq Oral BID Ramond Dial, RPH   40 mEq at 11/02/18 0841  . prochlorperazine (COMPAZINE) injection 5 mg  5 mg Intravenous Q4H PRN Lance Coon, MD       . rOPINIRole (REQUIP) tablet 2 mg  2 mg Oral QHS Arta Silence, MD   2 mg at 11/01/18 2155  . senna (SENOKOT) tablet 8.6 mg  1 tablet Oral Daily Conforti, John, DO   8.6 mg at 11/02/18 0839  . sodium chloride flush (NS) 0.9 % injection 3 mL  3 mL Intravenous Q12H Max Sane, MD   3 mL at 11/02/18 0842  . spironolactone (ALDACTONE) tablet 12.5 mg  12.5 mg Oral Daily Max Sane, MD   12.5 mg at 11/02/18 0839  . torsemide (DEMADEX) tablet 80 mg  80 mg Oral BID Max Sane, MD   80 mg at 11/02/18 0840  . traZODone (DESYREL) tablet 50 mg  50 mg Oral QHS PRN Arta Silence, MD   50 mg at 11/01/18 2155  . venlafaxine XR (EFFEXOR-XR) 24 hr capsule 150 mg  150 mg Oral Daily Lance Coon, MD   150 mg at 11/02/18 0840  . warfarin (COUMADIN) tablet 7 mg  7 mg Oral Once per day on Mon Tue Wed Thu Fri Ramond Dial, [facility]      . [START ON 11/07/2018] warfarin (COUMADIN) tablet 8 mg  8 mg Oral Once per day on Sun Sat Epifanio Lesches, MD      . Warfarin - Pharmacist Dosing Inpatient   Does not apply [id number] Max Sane, MD       Past Medical History:  Diagnosis Date  . Acute diastolic heart failure ([place])   . Allergy   . ANCA-associated vasculitis ([place])   . Asthma   . Atrial fibrillation ([place])   . Backache, unspecified   . Cancer ([place])    skin  . Cardiomegaly   . COPD (chronic obstructive pulmonary disease) ([place])   . Diabetes mellitus without complication ([place])   . 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 Esophageal reflux   . Headache(784.0)   . Herpes zoster without mention of complication   . Hx: UTI (urinary tract infection)   . Hypertension    heart controlled w CHF  . Nontoxic uninodular goiter   . Obesity, unspecified   . Osteoarthrosis, unspecified whether generalized or localized, unspecified site   . Unspecified sleep apnea   . Urine incontinence    hx of   Past Surgical History:  Procedure Laterality Date  . ABDOMINAL HYSTERECTOMY   1979   complete (for precancerous cells)  . ABLATION  2011 & 2014  . bladder botox  01/26/2018  . CHOLECYSTECTOMY    . CYSTOSCOPY WITH FULGERATION N/A 01/28/2018   Procedure: [place] AND CLOT EVACUATION;  Surgeon: Hollice Espy, MD;  Location: [facility] ORS;  Service: Urology;  Laterality: N/A;  . Interstim Placement  2012  . OOPHORECTOMY     Social History Social History   Tobacco Use  . Smoking status: Former Smoker    Packs/day: 0.50    Years: 15.00    Pack years: 7.50  Types: Cigarettes  . Smokeless tobacco: Never Used  . Tobacco comment: Has a 20-pack-year history, qutting in 1970.   Substance Use Topics  . Alcohol use: No    Alcohol/week: 0.0 standard drinks  . Drug use: No   Family History Family History  Problem Relation Age of Onset  . Heart attack Father   . Heart failure Father   . Arthritis Father   . Stroke Father   . Hypertension Father   . Coronary artery disease Brother   . Peripheral vascular disease Brother   . Arthritis Mother   . Colon cancer Mother        colon cancer  . Hypertension Mother   . Cancer Maternal Grandmother        colon cancer  . Arthritis Maternal Grandmother   Denies any family history of venous disease, renal disease or bleeding/clotting disorders.  Allergies  Allergen Reactions  . Flecainide Shortness Of Breath and Other (See Comments)    Reaction: dizziness   . Amiodarone Other (See Comments)    Pt states that this medication causes lung bleeding.     REVIEW OF SYSTEMS (Negative unless checked)  Constitutional: [] Weight loss  [x] Fever  [] Chills Cardiac: [] Chest pain   [] Chest pressure   [] Palpitations   [] Shortness of breath when laying flat   [] Shortness of breath at rest   [] Shortness of breath with exertion. Vascular:  [] Pain in legs with walking   [] Pain in legs at rest   [] Pain in legs when laying flat   [] Claudication   [] Pain in feet when walking  [] Pain in feet at rest  [] Pain in feet when laying  flat   [] History of DVT   [] Phlebitis   [x] Swelling in legs   [] Varicose veins   [] Non-healing ulcers Pulmonary:   [x] Uses home oxygen   [] Productive cough   [] Hemoptysis   [] Wheeze  [x] COPD   [] Asthma Neurologic:  [] Dizziness  [] Blackouts   [] Seizures   [] History of stroke   [] History of TIA  [] Aphasia   [] Temporary blindness   [] Dysphagia   [] Weakness or numbness in arms   [] Weakness or numbness in legs Musculoskeletal:  [x] Arthritis   [] Joint swelling   [] Joint pain   [x] Low back pain Hematologic:  [] Easy bruising  [] Easy bleeding   [] Hypercoagulable state   [] Anemic  [] Hepatitis Gastrointestinal:  [] Blood in stool   [] Vomiting blood  [] Gastroesophageal reflux/heartburn   [] Difficulty swallowing. Genitourinary:  [] Chronic kidney disease   [] Difficult urination  [] Frequent urination  [] Burning with urination   [] Blood in urine Skin:  [] Rashes   [] Ulcers   [] Wounds Psychological:  [] History of anxiety   []  History of major depression.  Physical Examination  Vitals:   11/01/18 2001 11/02/18 0425 11/02/18 0726 11/02/18 1258  BP: 115/70 126/69 (!) 118/55   Pulse: 72 83 76   Resp: 18 18    Temp: 98.3 F (36.8 C) 98.7 F (37.1 C) 98.3 F (36.8 C)   TempSrc: Oral Oral Oral   SpO2: 100% 90% 97% 91%  Weight:  94.5 kg    Height:       Body mass index is 30.78 kg/m. Gen:  WD/WN, NAD Head: [place]/AT, No temporalis wasting. Prominent temp pulse not noted. Ear/Nose/Throat: Hearing grossly intact, nares w/o erythema or drainage, oropharynx w/o Erythema/Exudate Eyes: Sclera non-icteric, conjunctiva clear Neck: Trachea midline.  No JVD.  Pulmonary:  Good air movement, respirations not labored, equal bilaterally.  Cardiac: RRR, normal S1, S2. Vascular:  Vessel Right Left  Radial Palpable Palpable  Ulnar Palpable Palpable  Brachial Palpable Palpable  Carotid Palpable, without bruit Palpable, without bruit  Aorta Not palpable N/A  Femoral Palpable Palpable  Popliteal Palpable Palpable  PT  Palpable Palpable  DP Palpable Palpable   Gastrointestinal: soft, non-tender/non-distended. No guarding/reflex.  This noted to the left lower quadrant.  Skin is intact.  No surrounding cellulitis/erythema.   Musculoskeletal: M/S 5/5 throughout.  Extremities without ischemic changes.  No deformity or atrophy.  Neurologic: Sensation grossly intact in extremities.  Symmetrical.  Speech is fluent. Motor exam as listed above. Psychiatric: Judgment intact, Mood & affect appropriate for pt's clinical situation. Dermatologic: No rashes or ulcers noted.  No cellulitis or open wounds. Lymph : No Cervical, Axillary, or Inguinal lymphadenopathy.  CBC Lab Results  Component Value Date   WBC 8.8 11/02/2018   HGB 12.6 11/02/2018   HCT 40.6 11/02/2018   MCV 92.7 11/02/2018   PLT 287 11/02/2018   BMET    Component Value Date/Time   NA 138 11/02/2018 0435   K 4.5 11/02/2018 0435   K 3.7 09/27/2014 1446   CL 94 (L) 11/02/2018 0435   CO2 35 (H) 11/02/2018 0435   GLUCOSE 195 (H) 11/02/2018 0435   BUN 20 11/02/2018 0435   CREATININE 0.63 11/02/2018 0435   CALCIUM 9.3 11/02/2018 0435   GFRNONAA >60 11/02/2018 0435   GFRAA >60 11/02/2018 0435   Estimated Creatinine Clearance: 78.9 mL/min (by C-G formula based on SCr of 0.63 mg/dL).  COAG Lab Results  Component Value Date   INR 2.20 11/02/2018   INR 2.15 11/01/2018   INR 1.66 10/31/2018   Radiology Ct Head Wo Contrast  Result Date: 10/27/2018 CLINICAL DATA:  72 y/o F; involuntary eye rolling head movements. Altered mental status, unclear cause. EXAM: CT HEAD WITHOUT CONTRAST TECHNIQUE: Contiguous axial images were obtained from the base of the skull through the vertex without intravenous contrast. COMPARISON:  02/19/2018 CT head. FINDINGS: Brain: Motion degraded study. No evidence of acute infarction, hemorrhage, hydrocephalus, extra-axial collection or mass lesion/mass effect. 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 Stable  mild chronic microvascular ischemic changes and volume loss of the brain. Vascular: Calcific atherosclerosis of carotid siphons. No hyperdense vessel identified. Skull: Normal. Negative for fracture or focal lesion. Sinuses/Orbits: Moderate right maxillary sinus mucosal thickening. Additional visible paranasal sinuses and the mastoid air cells are normally aerated. Other: Bilateral intra-ocular lens replacement. IMPRESSION: Motion degraded study. 1. No acute intracranial abnormality. 2. 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 Electronically Signed   By: Kristine Garbe M.D.   On: 10/27/2018 14:32   US Pelvis Limited (transabdominal Only)  Result Date: 11/01/2018 CLINICAL DATA:  LEFT lower quadrant pain. History of Lovenox injections, now with induration. EXAM: LIMITED ULTRASOUND OF PELVIS TECHNIQUE: Limited transabdominal ultrasound examination of the pelvis was performed. COMPARISON:  None. FINDINGS: Complex mixed echogenicity collection is demonstrated within the superficial subcutaneous soft tissues of the LEFT lower abdomen, measuring 11.5 cm in length and 2.3 cm in width, avascular, presumed abscess. IMPRESSION: Complex fluid collection within the superficial subcutaneous soft tissues of the LEFT lower abdominal wall, corresponding to the area of clinical concern, presumed abscess collection, possibly multiloculated, overall measuring 11.5 cm in length and 2.3 cm in width. These results will be called to the ordering clinician or representative by the Radiologist Assistant, and communication documented in the PACS or zVision Dashboard. Electronically Signed   By:  Franki Cabot M.D.   On: 11/01/2018 15:46   Ct Abdomen Pelvis W Contrast  Result Date: 11/01/2018 CLINICAL DATA:  Acute onset of severe generalized abdominal pain and nausea. EXAM: CT ABDOMEN AND PELVIS WITH CONTRAST TECHNIQUE: Multidetector CT  imaging of the abdomen and pelvis was performed using the standard protocol following bolus administration of intravenous contrast. CONTRAST:  145mL ISOVUE-300 IOPAMIDOL (ISOVUE-300) INJECTION 61% COMPARISON:  Limited pelvic ultrasound performed earlier today at 1:30 p.m. FINDINGS: Lower chest: Trace right-sided pleural fluid is noted. Mild bibasilar atelectasis or scarring is seen, with increased interstitial markings. Would correlate clinically to exclude mild pulmonary edema. The patient is status post median sternotomy. Pacemaker leads and a mitral valve replacement are seen. Hepatobiliary: The nodular contour of the liver raises concern for mild hepatic cirrhosis. The patient is status post cholecystectomy, with clips noted at the gallbladder fossa. The common bile duct remains normal in caliber. Pancreas: The pancreas is within normal limits. Spleen: The spleen is unremarkable in appearance. Adrenals/Urinary Tract: The adrenal glands are unremarkable in appearance. The kidneys are within normal limits. There is no evidence of hydronephrosis. No renal or ureteral stones are identified. No perinephric stranding is seen. Stomach/Bowel: The stomach is unremarkable in appearance. The small bowel is within normal limits. The appendix is normal in caliber, without evidence of appendicitis. Scattered diverticulosis is noted along the descending and sigmoid colon, without evidence of diverticulitis. Vascular/Lymphatic: Diffuse calcification is seen along the abdominal aorta and its branches. The abdominal aorta is otherwise grossly unremarkable. The inferior vena cava is grossly unremarkable. No retroperitoneal lymphadenopathy is seen. No pelvic sidewall lymphadenopathy is identified. Reproductive: The bladder is mildly distended and grossly unremarkable. The patient is status post hysterectomy. No suspicious adnexal masses are seen. Other: A tiny umbilical hernia is noted, containing only fat. A metallic device is  noted at the right flank, with a lead extending into the right hemipelvis. The complex collection of fluid along the left lower quadrant anterior abdominal wall is interspersed about the subcutaneous fat and most likely reflects superficial soft tissue hemorrhage, given its appearance, measuring approximately 14.4 x 2.5 cm. No definite abscess is seen. Additional small foci of inflammation along the abdominal wall likely reflect injection sites. Musculoskeletal: No acute osseous abnormalities are identified. There is mild chronic loss of height at vertebral body L1, with underlying facet disease. The visualized musculature is unremarkable in appearance. IMPRESSION: 1. Complex collection of fluid along the left lower quadrant anterior abdominal wall is interspersed about the subcutaneous fat and most likely reflects superficial soft tissue hemorrhage, given its appearance, measuring approximately 14.4 x 2.5 cm. No abscess seen. 2. Trace right-sided pleural fluid. Mild bibasilar atelectasis or scarring, with increased interstitial markings. Would correlate clinically to exclude mild pulmonary edema. 3. Scattered diverticulosis along the descending and sigmoid colon, without evidence of diverticulitis. 4. Tiny umbilical hernia, containing only fat. 5. Nodular contour of the liver raises concern for mild hepatic cirrhosis. Aortic Atherosclerosis (ICD10-I70.0). Electronically Signed   By: Garald Balding M.D.   On: 11/01/2018 22:15   Dg Chest Portable 1 View  Result Date: 10/26/2018 CLINICAL DATA:  Shortness of breath and altered mental status EXAM: PORTABLE CHEST 1 VIEW COMPARISON:  02/28/2018 chest radiograph FINDINGS: Left chest wall pacemaker leads are in unchanged position. Sequelae of median sternotomy and valve replacement. There is shallow lung inflation with mild interstitial opacity. No pneumothorax or sizable pleural effusion. Mild cardiomegaly is unchanged. There is calcific aortic atherosclerosis.  IMPRESSION: Shallow lung inflation  with chronic interstitial opacities, unchanged. Electronically Signed   By: Ulyses Jarred M.D.   On: 10/26/2018 22:16   Mm 3d Screen Breast Bilateral  Result Date: 10/19/2018 CLINICAL DATA:  Screening. EXAM: DIGITAL SCREENING BILATERAL MAMMOGRAM WITH TOMO AND CAD COMPARISON:  Previous exam(s). ACR Breast Density Category b: There are scattered areas of fibroglandular density. FINDINGS: There are no findings suspicious for malignancy. Images were processed with CAD. IMPRESSION: No mammographic evidence of malignancy. A result letter of this screening mammogram will be mailed directly to the patient. RECOMMENDATION: Screening mammogram in one year. (Code:[id number]-B-01Y) BI-RADS CATEGORY  1: Negative. Electronically Signed   By: Kristopher Oppenheim M.D.   On: 10/19/2018 14:15   Assessment/Plan The patient is a 72 year old female with a past medical history of congestive heart failure, ANCA associated vasculitis, atrial fibrillation, COPD, diabetes mellitus type 2, GERD, hypertension who on the AAA of the abdomen pelvis was found to have aortic atherosclerosis and superficial soft tissue hemorrhage - Stable  1. Soft Tissue Hemorrhage of Abdomen: H&H is stable.  Ecchymosis is stable.  Discomfort is improving.  No abscess noted on CT.  Seen by general surgery.  At this time, there is no role for any endovascular/open vascular intervention.  Due to the patient's atrial fibrillation/mechanical heart valve would continue Coumadin. 2.  Aortic Atherosclerosis: At this time, the patient denies any claudication-like symptoms, rest pain or ulcer formation of the bilateral lower extremities.  Patient is on Lipitor.  Minimal atherosclerotic plaque noted on CT and in the absence of symptoms there is no indication for vascular intervention at this time.  Would be happy to follow this as an outpatient. 3. Diabetes: On appropriate medications. Encouraged good control as its slows the progression of  atherosclerotic disease 4. Hypertension: On appropriate medications. Encouraged good control as its slows the progression of atherosclerotic disease 5. Hyperlipidemia: On Statin. Encouraged good control as its slows the progression of atherosclerotic disease.  Discussed with Dr. Mayme Genta, PA-C  11/02/2018 1:38 PM  This note was created with Dragon medical transcription system.  Any error is purely unintentional.

## 2018-11-02 NOTE — Care Management Important Message (Signed)
Important Message  Patient Details  Name: Betty [name] MRN: [id number] Date of Birth: 06/10/1946   Medicare Important Message Given:  Yes    Elza Rafter, RN 11/02/2018, 4:19 PM

## 2018-11-02 NOTE — Progress Notes (Signed)
Pharmacy Electrolyte Monitoring Consult:  Pharmacy consulted to assist in monitoring and replacing electrolytes in this 72 y.o. female admitted on 10/26/2018 with atrial fibrillation. Patient transferred out of ICU on 11/6.   Labs:  Sodium (mmol/L)  Date Value  11/02/2018 138   Potassium (mmol/L)  Date Value  11/02/2018 4.5  09/27/2014 3.7   Magnesium (mg/dL)  Date Value  11/01/2018 2.2   Phosphorus (mg/dL)  Date Value  03/01/2018 2.4 (L)   Calcium (mg/dL)  Date Value  11/02/2018 9.3   Albumin (g/dL)  Date Value  10/26/2018 4.1    Assessment/Plan:  Electrolytes:  Will replace for goal potassium ~4 and goal magnesium ~ 2.  Within range, no additional supplementation needed.  Will check BMP with AM labs   Pharmacy will continue to monitor and adjust per consult.    Paulina Fusi, PharmD, BCPS 11/02/2018 4:03 PM

## 2018-11-02 NOTE — Progress Notes (Signed)
Physical Therapy Treatment Patient Details Name: Betty [name] MRN: [id number] DOB: 05-Sep-1946 Today's Date: 11/02/2018    History of Present Illness presented to ER secondary to weakness, AMS; admitted with sepsis due to UTI, PNA    PT Comments    Patient up in chair upon arrival to session.  Sleeping, but easily arouseable. Demonstrates ability to complete sit/stand, basic transfers and gait with RW, cga/close sup.  Mild forward trunk flexion with mod WBing on walker; does prefer continued use for optimal comfort/safety at this time. Sats >91% on 3L supplemental O2 throughout session. Did encourage discontinuation of external catheter to promote continued mobility necessary to facilitate safe discharge home.   Follow Up Recommendations  Home health PT     Equipment Recommendations  Rolling walker with 5" wheels    Recommendations for Other Services       Precautions / Restrictions Precautions Precautions: Fall Precaution Comments: contact iso Restrictions Weight Bearing Restrictions: No    Mobility  Bed Mobility               General bed mobility comments: seated in recliner beginning/end of treatment session  Transfers Overall transfer level: Needs assistance Equipment used: Rolling walker (2 wheeled) Transfers: Sit to/from Stand Sit to Stand: Supervision         General transfer comment: does require UE support on seating surfaces for lift off assist  Ambulation/Gait Ambulation/Gait assistance: Supervision Gait Distance (Feet): 200 Feet Assistive device: Rolling walker (2 wheeled)       General Gait Details: reciprocal stepping pattern with decreased cadence, but no overt buckling or LOB.  Does prefer continued use of RW for optimal safety and energy conservation.   Stairs             Wheelchair Mobility    Modified Rankin (Stroke Patients Only)       Balance Overall balance assessment: Needs assistance Sitting-balance support: No  upper extremity supported;Feet supported Sitting balance-Leahy Scale: Good     Standing balance support: Bilateral upper extremity supported Standing balance-Leahy Scale: Fair                              Cognition Arousal/Alertness: Awake/alert Behavior During Therapy: WFL for tasks assessed/performed Overall Cognitive Status: Within Functional Limits for tasks assessed                                        Exercises Other Exercises Other Exercises: Toilet transfer, ambulatory with RW, close sup/mod indep; sit/stand from standard toilet, sup with grab bar.  Does require UE support to complete Other Exercises: Standing balance for hygiene, clothing management and hang hygiene at sink, close sup-fair/good insight and awareness into limits of stability and overall safety needs.    General Comments        Pertinent Vitals/Pain Pain Assessment: No/denies pain    Home Living                      Prior Function            PT Goals (current goals can now be found in the care plan section) Acute Rehab PT Goals Patient Stated Goal: to return home PT Goal Formulation: With patient Time For Goal Achievement: 11/11/18 Potential to Achieve Goals: Good Progress towards PT goals: Progressing toward goals    Frequency  Min 2X/week      PT Plan Current plan remains appropriate    Co-evaluation              AM-PAC PT "6 Clicks" Daily Activity  Outcome Measure  Difficulty turning over in bed (including adjusting bedclothes, sheets and blankets)?: None Difficulty moving from lying on back to sitting on the side of the bed? : None Difficulty sitting down on and standing up from a chair with arms (e.g., wheelchair, bedside commode, etc,.)?: A Little Help needed moving to and from a bed to chair (including a wheelchair)?: A Little Help needed walking in hospital room?: A Little Help needed climbing 3-5 steps with a railing? : A  Little 6 Click Score: 20    End of Session Equipment Utilized During Treatment: Gait belt;Oxygen Activity Tolerance: Patient tolerated treatment well Patient left: in chair;with call bell/phone within reach;with chair alarm set;with family/visitor present Nurse Communication: Mobility status PT Visit Diagnosis: Muscle weakness (generalized) (M62.81);Difficulty in walking, not elsewhere classified (R26.2)     Time: 0923-3007 PT Time Calculation (min) (ACUTE ONLY): 24 min  Charges:  $Gait Training: 8-22 mins $Therapeutic Activity: 8-22 mins                     Neeti Knudtson H. Owens Shark, PT, DPT, NCS 11/02/18, 1:40 PM [phone]

## 2018-11-02 NOTE — Consult Note (Addendum)
[facility] Consult Note  Betty [name] 1946-02-15  [id number].    Requesting MD: Dr. Vianne Bulls, MD Chief Complaint/Reason for Consult: Abdominal Wall Hematoma  HPI:  Betty [name] is a 72 y.o. female who was admitted to the Hospitalist/CCM service on 11/04 for sepsis likely secondary to UTI vs PNA and started on IV ABx. She has a history of mechanical mitral valve and had been transitioning from Lovenox to warfarin once her INR was within goal. On 11/10, the patient raised concerns over LLQ ecchymosis and pain. She had originally attributed this to her Lovenox shots but the pain was new for her. She was worked up with an [place] and CT which was concerning for abdominal wall hemorrhage vs hematoma. She has been bridged from Lovenox to warfarin at this time.   General surgery is consulted by Hospitalist physician Dr Vianne Bulls, MD for evaluation and management of her abdominal wall hematoma.   ROS: Review of Systems  Constitutional: Negative for chills and fever.  Respiratory: Negative for cough and shortness of breath.   Cardiovascular: Negative for chest pain, palpitations and leg swelling.  Gastrointestinal: Positive for abdominal pain. Negative for diarrhea, nausea and vomiting.  Genitourinary: Negative for dysuria and hematuria.  Skin:       + ecchymosis  Neurological: Negative for dizziness, weakness and headaches.    Family History  Problem Relation Age of Onset  . Heart attack Father   . Heart failure Father   . Arthritis Father   . Stroke Father   . Hypertension Father   . Coronary artery disease Brother   . Peripheral vascular disease Brother   . Arthritis Mother   . Colon cancer Mother        colon cancer  . Hypertension Mother   . Cancer Maternal Grandmother        colon cancer  . Arthritis Maternal Grandmother     Past Medical History:  Diagnosis Date  . Acute diastolic heart failure ([place])   . Allergy   . 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 Asthma    . Atrial fibrillation ([place])   . Backache, unspecified   . Cancer ([place])    skin  . Cardiomegaly   . COPD (chronic obstructive pulmonary disease) ([place])   . Diabetes mellitus without complication ([place])   . 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 Esophageal reflux   . Headache(784.0)   . Herpes zoster without mention of complication   . Hx: UTI (urinary tract infection)   . Hypertension    heart controlled w CHF  . Nontoxic uninodular goiter   . Obesity, unspecified   . Osteoarthrosis, unspecified whether generalized or localized, unspecified site   . Unspecified sleep apnea   . Urine incontinence    hx of    Past Surgical History:  Procedure Laterality Date  . ABDOMINAL HYSTERECTOMY  1979   complete (for precancerous cells)  . ABLATION  2011 & 2014  . bladder botox  01/26/2018  . CHOLECYSTECTOMY    . CYSTOSCOPY WITH FULGERATION N/A 01/28/2018   Procedure: [place] AND CLOT EVACUATION;  Surgeon: Hollice Espy, MD;  Location: [facility] ORS;  Service: Urology;  Laterality: N/A;  . Interstim Placement  2012  . OOPHORECTOMY      Social History:  reports that she has quit smoking. Her smoking use included cigarettes. She has a 7.50 pack-year smoking history. She has never used smokeless tobacco. She reports that she does  not drink alcohol or use drugs.  Allergies:  Allergies  Allergen Reactions  . Flecainide Shortness Of Breath and Other (See Comments)    Reaction: dizziness   . Amiodarone Other (See Comments)    Pt states that this medication causes lung bleeding.      Medications Prior to Admission  Medication Sig Dispense Refill  . acetaminophen (TYLENOL) 325 MG tablet Take 2 tablets (650 mg total) by mouth every 6 (six) hours as needed for mild pain (or Fever >/= 101). 30 tablet 0  . albuterol (PROVENTIL) (2.5 MG/3ML) 0.083% nebulizer solution Take 3 mLs (2.5 mg total) by nebulization every 4 (four) hours as needed for wheezing  or shortness of breath. 75 mL 12  . atorvastatin (LIPITOR) 20 MG tablet Take 20 mg by mouth at bedtime.     Marland Kitchen azaTHIOprine (IMURAN) 50 MG tablet Take 100 mg by mouth daily.     Marland Kitchen enoxaparin (LOVENOX) 100 MG/ML injection Inject 0.95 mLs (95 mg total) into the skin every 12 (twelve) hours. X 3-4 days until INR reaches goal of 2.5 (Patient taking differently: Inject 90 mg into the skin every 12 (twelve) hours. X 3-4 days until INR reaches goal of 2.5) 5 mL 0  . ferrous sulfate 325 (65 FE) MG EC tablet Take 325 mg by mouth daily.   11  . gabapentin (NEURONTIN) 600 MG tablet Take 300-600 mg by mouth 3 (three) times daily. Take 2 capsules ('600mg'$ ) in morning, 1 capsule ('300mg'$ ) at midday, and 2 capsules ('600mg'$ ) at bedtime    . levocetirizine (XYZAL) 5 MG tablet Take 5 mg by mouth at bedtime.    . metFORMIN (GLUCOPHAGE) 1000 MG tablet Take 500 mg by mouth 2 (two) times daily with a meal.     . metoprolol tartrate (LOPRESSOR) 25 MG tablet Take 25 mg by mouth 2 (two) times daily.    . pantoprazole (PROTONIX) 40 MG tablet Take 40 mg by mouth daily.    . potassium chloride (MICRO-K) 10 MEQ CR capsule Take 20 mEq by mouth 2 (two) times daily.    . ranitidine (ZANTAC) 150 MG tablet Take 150 mg by mouth 2 (two) times daily.    Marland Kitchen rOPINIRole (REQUIP) 1 MG tablet Take 1 mg by mouth at bedtime.    Marland Kitchen spironolactone (ALDACTONE) 25 MG tablet Take 12.5 mg by mouth daily.    Marland Kitchen torsemide (DEMADEX) 20 MG tablet Take 80 mg by mouth 2 (two) times daily.    . traZODone (DESYREL) 50 MG tablet Take 50 mg by mouth at bedtime.     Marland Kitchen venlafaxine XR (EFFEXOR-XR) 150 MG 24 hr capsule Take 150 mg by mouth daily.    . Vitamin D, Ergocalciferol, (DRISDOL) 50000 units CAPS capsule Take 50,000 Units by mouth once a week.    . warfarin (COUMADIN) 3 MG tablet Take 3 mg by mouth daily. On Saturday and Sunday    . warfarin (COUMADIN) 4 MG tablet Take 8 mg by mouth daily. Monday - Friday    . guaiFENesin-dextromethorphan (ROBITUSSIN DM)  100-10 MG/5ML syrup Take 5 mLs by mouth every 4 (four) hours as needed for cough. (Patient not taking: Reported on 10/27/2018) 118 mL 0  . metolazone (ZAROXOLYN) 2.5 MG tablet Take 2.5 mg by mouth 2 (two) times a week. Take 2.5 mg by mouth on Monday and Thursday.      Blood pressure (!) 118/55, pulse 76, temperature 98.3 F (36.8 C), temperature source Oral, resp. rate 18, height '5\' 9"'$  (1.753 m),  weight 94.5 kg, SpO2 97 %. Physical Exam: Physical Exam  Constitutional: She is oriented to person, place, and time. She appears well-developed and well-nourished. No distress. Nasal cannula in place.  HENT:  Head: Normocephalic and atraumatic.  Eyes: Conjunctivae are normal. No scleral icterus.  Cardiovascular: Normal rate, regular rhythm and normal pulses.  Murmur heard.  Systolic murmur is present with a grade of 3/6. Pulmonary/Chest: Effort normal. She has no decreased breath sounds.  Abdominal: Soft. Normal appearance. There is no tenderness.    Musculoskeletal: She exhibits no deformity.  Neurological: She is alert and oriented to person, place, and time.  Skin: Skin is warm. Ecchymosis (LLQ) noted. She is not diaphoretic.  Psychiatric: She has a normal mood and affect. Her behavior is normal.    Results for orders placed or performed during the hospital encounter of 10/26/18 (from the past 48 hour(s))  Heparin level (unfractionated)     Status: None   Collection Time: 10/31/18 10:03 AM  Result Value Ref Range   Heparin Unfractionated 0.36 0.30 - 0.70 IU/mL    Comment: (NOTE) If heparin results are below expected values, and patient dosage has  been confirmed, suggest follow up testing of antithrombin III levels. Performed at [facility], [address] [place], [place] [address]   Glucose, capillary     Status: Abnormal   Collection Time: 10/31/18 11:44 AM  Result Value Ref Range   Glucose-Capillary 298 (H) 70 - 99 mg/dL   Comment 1 Notify RN    Comment 2 Document  in Chart   CULTURE, BLOOD (ROUTINE X 2) w Reflex to ID Panel     Status: None (Preliminary result)   Collection Time: 10/31/18  1:03 PM  Result Value Ref Range   Specimen Description BLOOD RAC    Special Requests      BOTTLES DRAWN AEROBIC AND ANAEROBIC Blood Culture results may not be optimal due to an excessive volume of blood received in culture bottles   Culture      NO GROWTH 2 DAYS Performed at [facility], [address] [place], [place] [address]    Report Status PENDING   CULTURE, BLOOD (ROUTINE X 2) w Reflex to ID Panel     Status: None (Preliminary result)   Collection Time: 10/31/18  1:03 PM  Result Value Ref Range   Specimen Description BLOOD R HAND    Special Requests      BOTTLES DRAWN AEROBIC AND ANAEROBIC Blood Culture adequate volume   Culture      NO GROWTH 2 DAYS Performed at [facility], [address] [place], [place] [address]    Report Status PENDING   Glucose, capillary     Status: Abnormal   Collection Time: 10/31/18  5:00 PM  Result Value Ref Range   Glucose-Capillary 233 (H) 70 - 99 mg/dL   Comment 1 Notify RN    Comment 2 Document in Chart   Glucose, capillary     Status: Abnormal   Collection Time: 10/31/18  9:00 PM  Result Value Ref Range   Glucose-Capillary 231 (H) 70 - 99 mg/dL  Protime-INR     Status: Abnormal   Collection Time: 11/01/18  4:36 AM  Result Value Ref Range   Prothrombin Time 23.7 (H) 11.4 - 15.2 seconds   INR 2.15     Comment: Performed at [facility], [address] [place],  [address]  Basic metabolic panel     Status: Abnormal  Collection Time: 11/01/18  4:36 AM  Result Value Ref Range   Sodium 139 135 - 145 mmol/L   Potassium 3.5 3.5 - 5.1 mmol/L   Chloride 95 (L) 98 - 111 mmol/L   CO2 35 (H) 22 - 32 mmol/L   Glucose, Bld 195 (H) 70 - 99 mg/dL   BUN 21 8 - 23 mg/dL   Creatinine, Ser 0.74 0.44 - 1.00 mg/dL   Calcium 8.6 (L) 8.9 - 10.3 mg/dL   GFR calc non Af Amer >60 >60  mL/min   GFR calc Af Amer >60 >60 mL/min    Comment: (NOTE) The eGFR has been calculated using the CKD EPI equation. This calculation has not been validated in all clinical situations. eGFR's persistently <60 mL/min signify possible Chronic Kidney Disease.    Anion gap 9 5 - 15    Comment: Performed at [facility], [address] [place], [place] [address]  Magnesium     Status: None   Collection Time: 11/01/18  4:36 AM  Result Value Ref Range   Magnesium 2.2 1.7 - 2.4 mg/dL    Comment: Performed at [facility], [place], [place] [address]  Heparin level (unfractionated)     Status: None   Collection Time: 11/01/18  4:36 AM  Result Value Ref Range   Heparin Unfractionated 0.38 0.30 - 0.70 IU/mL    Comment: (NOTE) If heparin results are below expected values, and patient dosage has  been confirmed, suggest follow up testing of antithrombin III levels. Performed at [facility], [address] [place], [place] [address]   CBC     Status: Abnormal   Collection Time: 11/01/18  4:36 AM  Result Value Ref Range   WBC 7.6 4.0 - 10.5 K/uL   RBC 4.48 3.87 - 5.11 MIL/uL   Hemoglobin 12.9 12.0 - 15.0 g/dL   HCT 41.3 36.0 - 46.0 %   MCV 92.2 80.0 - 100.0 fL   MCH 28.8 26.0 - 34.0 pg   MCHC 31.2 30.0 - 36.0 g/dL   RDW 15.9 (H) 11.5 - 15.5 %   Platelets 229 150 - 400 K/uL   nRBC 0.0 0.0 - 0.2 %    Comment: Performed at [facility], [address] [place], [place] [address]  Glucose, capillary     Status: Abnormal   Collection Time: 11/01/18  8:06 AM  Result Value Ref Range   Glucose-Capillary 179 (H) 70 - 99 mg/dL  Glucose, capillary     Status: Abnormal   Collection Time: 11/01/18 12:01 PM  Result Value Ref Range   Glucose-Capillary 298 (H) 70 - 99 mg/dL  Glucose, capillary     Status: Abnormal   Collection Time: 11/01/18  5:17 PM  Result Value Ref Range   Glucose-Capillary 255 (H) 70 - 99 mg/dL  Glucose, capillary      Status: Abnormal   Collection Time: 11/01/18  9:20 PM  Result Value Ref Range   Glucose-Capillary 187 (H) 70 - 99 mg/dL  Protime-INR     Status: Abnormal   Collection Time: 11/02/18  4:35 AM  Result Value Ref Range   Prothrombin Time 24.1 (H) 11.4 - 15.2 seconds   INR 2.20     Comment: Performed at [facility], [address] [place], [place] [address]  CBC     Status: Abnormal   Collection Time: 11/02/18  4:35 AM  Result Value Ref Range   WBC 8.8 4.0 - 10.5 K/uL  RBC 4.38 3.87 - 5.11 MIL/uL   Hemoglobin 12.6 12.0 - 15.0 g/dL   HCT 40.6 36.0 - 46.0 %   MCV 92.7 80.0 - 100.0 fL   MCH 28.8 26.0 - 34.0 pg   MCHC 31.0 30.0 - 36.0 g/dL   RDW 15.9 (H) 11.5 - 15.5 %   Platelets 287 150 - 400 K/uL   nRBC 0.0 0.0 - 0.2 %    Comment: Performed at [facility], [address] [place], [place] [address]  Basic metabolic panel     Status: Abnormal   Collection Time: 11/02/18  4:35 AM  Result Value Ref Range   Sodium 138 135 - 145 mmol/L   Potassium 4.5 3.5 - 5.1 mmol/L   Chloride 94 (L) 98 - 111 mmol/L   CO2 35 (H) 22 - 32 mmol/L   Glucose, Bld 195 (H) 70 - 99 mg/dL   BUN 20 8 - 23 mg/dL   Creatinine, Ser 0.63 0.44 - 1.00 mg/dL   Calcium 9.3 8.9 - 10.3 mg/dL   GFR calc non Af Amer >60 >60 mL/min   GFR calc Af Amer >60 >60 mL/min    Comment: (NOTE) The eGFR has been calculated using the CKD EPI equation. This calculation has not been validated in all clinical situations. eGFR's persistently <60 mL/min signify possible Chronic Kidney Disease.    Anion gap 9 5 - 15    Comment: Performed at [facility], [address] [place], [place] [address]  Glucose, capillary     Status: Abnormal   Collection Time: 11/02/18  7:23 AM  Result Value Ref Range   Glucose-Capillary 160 (H) 70 - 99 mg/dL   US Pelvis Limited (transabdominal Only)  Result Date: 11/01/2018 CLINICAL DATA:  LEFT lower quadrant pain. History of Lovenox injections, now with induration.  EXAM: LIMITED ULTRASOUND OF PELVIS TECHNIQUE: Limited transabdominal ultrasound examination of the pelvis was performed. COMPARISON:  None. FINDINGS: Complex mixed echogenicity collection is demonstrated within the superficial subcutaneous soft tissues of the LEFT lower abdomen, measuring 11.5 cm in length and 2.3 cm in width, avascular, presumed abscess. IMPRESSION: Complex fluid collection within the superficial subcutaneous soft tissues of the LEFT lower abdominal wall, corresponding to the area of clinical concern, presumed abscess collection, possibly multiloculated, overall measuring 11.5 cm in length and 2.3 cm in width. These results will be called to the ordering clinician or representative by the Radiologist Assistant, and communication documented in the PACS or zVision Dashboard. Electronically Signed   By: Franki Cabot M.D.   On: 11/01/2018 15:46   Ct Abdomen Pelvis W Contrast  Result Date: 11/01/2018 CLINICAL DATA:  Acute onset of severe generalized abdominal pain and nausea. EXAM: CT ABDOMEN AND PELVIS WITH CONTRAST TECHNIQUE: Multidetector CT imaging of the abdomen and pelvis was performed using the standard protocol following bolus administration of intravenous contrast. CONTRAST:  123m ISOVUE-300 IOPAMIDOL (ISOVUE-300) INJECTION 61% COMPARISON:  Limited pelvic ultrasound performed earlier today at 1:30 p.m. FINDINGS: Lower chest: Trace right-sided pleural fluid is noted. Mild bibasilar atelectasis or scarring is seen, with increased interstitial markings. Would correlate clinically to exclude mild pulmonary edema. The patient is status post median sternotomy. Pacemaker leads and a mitral valve replacement are seen. Hepatobiliary: The nodular contour of the liver raises concern for mild hepatic cirrhosis. The patient is status post cholecystectomy, with clips noted at the gallbladder fossa. The common bile duct remains normal in caliber. Pancreas: The pancreas is within normal limits. Spleen:  The spleen is  unremarkable in appearance. Adrenals/Urinary Tract: The adrenal glands are unremarkable in appearance. The kidneys are within normal limits. There is no evidence of hydronephrosis. No renal or ureteral stones are identified. No perinephric stranding is seen. Stomach/Bowel: The stomach is unremarkable in appearance. The small bowel is within normal limits. The appendix is normal in caliber, without evidence of appendicitis. Scattered diverticulosis is noted along the descending and sigmoid colon, without evidence of diverticulitis. Vascular/Lymphatic: Diffuse calcification is seen along the abdominal aorta and its branches. The abdominal aorta is otherwise grossly unremarkable. The inferior vena cava is grossly unremarkable. No retroperitoneal lymphadenopathy is seen. No pelvic sidewall lymphadenopathy is identified. Reproductive: The bladder is mildly distended and grossly unremarkable. The patient is status post hysterectomy. No suspicious adnexal masses are seen. Other: A tiny umbilical hernia is noted, containing only fat. A metallic device is noted at the right flank, with a lead extending into the right hemipelvis. The complex collection of fluid along the left lower quadrant anterior abdominal wall is interspersed about the subcutaneous fat and most likely reflects superficial soft tissue hemorrhage, given its appearance, measuring approximately 14.4 x 2.5 cm. No definite abscess is seen. Additional small foci of inflammation along the abdominal wall likely reflect injection sites. Musculoskeletal: No acute osseous abnormalities are identified. There is mild chronic loss of height at vertebral body L1, with underlying facet disease. The visualized musculature is unremarkable in appearance. IMPRESSION: 1. Complex collection of fluid along the left lower quadrant anterior abdominal wall is interspersed about the subcutaneous fat and most likely reflects superficial soft tissue hemorrhage, given its  appearance, measuring approximately 14.4 x 2.5 cm. No abscess seen. 2. Trace right-sided pleural fluid. Mild bibasilar atelectasis or scarring, with increased interstitial markings. Would correlate clinically to exclude mild pulmonary edema. 3. Scattered diverticulosis along the descending and sigmoid colon, without evidence of diverticulitis. 4. Tiny umbilical hernia, containing only fat. 5. Nodular contour of the liver raises concern for mild hepatic cirrhosis. Aortic Atherosclerosis (ICD10-I70.0). Electronically Signed   By: Garald Balding M.D.   On: 11/01/2018 22:15      Assessment/Plan (ICD 10: M79.81) Betty [name] is a 72 y.o. female with a left lower quadrant abdominal wall hematoma with overlying skin cellulitis who requires anticoagulation given history of mechanical mitral valve which is complicated by additional co-morbidities including Asthma, COPD, Atrial fibrillation, DM, HTN, PVD, CHF, sleep apnea, obesity, and former tobacco abuse (smoking).   - Recommend following clinical abdominal examination and daily H&H to monitor LLQ abdominal wall hematoma  - She does appear to have an overlying cellulitis, would recommend course of Augmentin to manage this.   - No indication for emergent surgical intervention  - Okay for heart healthy diet  - Continue Warfarin  - Mobilize as toelrates   Edison Simon, PA-C [facility] 11/02/2018, 8:34 AM [phone] M-F: 7am - 4pm

## 2018-11-02 NOTE — Progress Notes (Signed)
Inpatient Diabetes Program Recommendations  AACE/ADA: New Consensus Statement on Inpatient Glycemic Control (2019)  Target Ranges:  Prepandial:   less than 140 mg/dL      Peak postprandial:   less than 180 mg/dL (1-2 hours)      Critically ill patients:  140 - 180 mg/dL   Results for Betty [name], Betty [name] (MRN [id number]) as of 11/02/2018 13:18  Ref. Range 11/01/2018 08:06 11/01/2018 12:01 11/01/2018 17:17 11/01/2018 21:20 11/02/2018 07:23 11/02/2018 11:39  Glucose-Capillary Latest Ref Range: 70 - 99 mg/dL 179 (H) 298 (H) 255 (H) 187 (H) 160 (H) 210 (H)   Review of Glycemic Control  Diabetes history: DM2 Outpatient Diabetes medications: Metformin 500 mg BID Current orders for Inpatient glycemic control: Metformin 500 mg BID, Novolog 0-9 units TID with meals, Novolog 0-5 units QHS  Inpatient Diabetes Program Recommendations:  Insulin - Meal Coverage: Please consider ordering Novolog 3 units TID with meals for meal coverage if patient eats at least 50% of meals. HbgA1C: Please consider ordering an A1C to evaluate glycemic control over the past 2-3 months.  Thanks, Barnie Alderman, RN, MSN, CDE Diabetes Coordinator Inpatient Diabetes Program [phone] (Team Pager from 8am to 5pm)

## 2018-11-02 NOTE — Progress Notes (Signed)
Daily Progress Note   Patient Name: Betty [name]       Date: 11/02/18 DOB: [birth year]  Age: 72 y.o. MRN#: [id number] Attending Physician: Max Sane, MD Primary Care Physician: Dion Body, MD Admit Date: 10/26/2018  Reason for Consultation/Follow-up: Establishing goals of care  Subjective: Patient awake, alert, oriented. Sitting comfortably in recliner. She is eager to go home soon but also understands reasoning for not going home today (abdominal pain/hematoma). Discussed updates from surgery and vascular surgery. Educated on s/s to monitor for worsening hematoma. Patient tells me the pain is better today. Discussed course of hospitalization including diagnoses and interventions.   Discussed difference between palliative and hospice services. Encouraged continued follow-up from outpatient palliative with underlying co-morbidities.   Length of Stay: 7  Current Medications: Scheduled Meds:  . atorvastatin  20 mg Oral Daily  . gabapentin  300 mg Oral Q1200  . gabapentin  600 mg Oral BID  . insulin aspart  0-5 Units Subcutaneous QHS  . insulin aspart  0-9 Units Subcutaneous TID WC  . metFORMIN  500 mg Oral BID WC  . metoprolol tartrate  25 mg Oral BID  . pantoprazole  40 mg Oral Daily  . potassium chloride  40 mEq Oral BID  . rOPINIRole  2 mg Oral QHS  . senna  1 tablet Oral Daily  . sodium chloride flush  3 mL Intravenous Q12H  . spironolactone  12.5 mg Oral Daily  . torsemide  80 mg Oral BID  . venlafaxine XR  150 mg Oral Daily  . warfarin  7 mg Oral Once per day on Mon Tue Wed Thu Fri  . [START ON 11/07/2018] warfarin  8 mg Oral Once per day on Sun Sat  . Warfarin - Pharmacist Dosing Inpatient   Does not apply q1800    Continuous Infusions: . sodium chloride Stopped  (10/29/18 1854)  . cefTRIAXone (ROCEPHIN)  IV Stopped (11/01/18 1820)    PRN Meds: sodium chloride, acetaminophen **OR** acetaminophen, MUSCLE RUB, prochlorperazine, traZODone  Physical Exam  Constitutional: She is oriented to person, place, and time. She is cooperative.  HENT:  Head: Normocephalic and atraumatic.  Pulmonary/Chest: No accessory muscle usage. No tachypnea. No respiratory distress.  Neurological: She is alert and oriented to person, place, and time.  Skin: Skin is warm and dry.  Psychiatric: She has a normal mood and affect. Her speech is normal and behavior is normal. Cognition and memory are normal.  Nursing note and vitals reviewed.           Vital Signs: BP (!) 118/55 (BP Location: Right Arm)   Pulse 76   Temp 98.3 F (36.8 C) (Oral)   Resp 18   Ht 5\' 9"  (1.753 m)   Wt 94.5 kg   SpO2 91%   BMI 30.78 kg/m  SpO2: SpO2: 91 % O2 Device: O2 Device: Nasal Cannula O2 Flow Rate: O2 Flow Rate (L/min): 3 L/min  Intake/output summary:   Intake/Output Summary (Last 24 hours) at 11/02/2018 1405 Last data filed at 11/02/2018 1696 Gross per 24 hour  Intake -  Output 2350 ml  Net -2350 ml   LBM: Last BM Date: 11/01/18 Baseline Weight: Weight: 99.1 kg Most recent weight: Weight: 94.5 kg       Palliative Assessment/Data: PPS 60%   Flowsheet Rows     Most Recent Value  Intake Tab  Referral Department  Hospitalist  Unit at Time of Referral  Med/Surg Unit  Palliative Care Primary Diagnosis  Sepsis/Infectious Disease  Date Notified  10/27/18  Palliative Care Type  New Palliative care  Reason for referral  Clarify Goals of Care  Date of Admission  10/26/18  Date first seen by Palliative Care  10/27/18  # of days Palliative referral response time  0 Day(s)  # of days IP prior to Palliative referral  1  Clinical Assessment  Palliative Performance Scale Score  60%  Psychosocial & Spiritual Assessment  Palliative Care Outcomes  Patient/Family meeting held?   Yes  Who was at the meeting?  patient  Palliative Care Outcomes  Clarified goals of care, Provided psychosocial or spiritual support      Patient Active Problem List   Diagnosis Date Noted  . Goals of care, counseling/discussion   . Palliative care by specialist   . Respiratory failure, acute ([place]) 02/26/2018  . COPD (chronic obstructive pulmonary disease) ([place]) 02/26/2018  . C2 cervical fracture ([place]) 02/19/2018  . Hematuria 01/27/2018  . Carotid stenosis 12/23/2017  . Varicose veins of both lower extremities with pain 11/17/2017  . Acute on chronic diastolic CHF (congestive heart failure) ([place]) 08/13/2017  . Leg pain 07/14/2017  . Chronic venous insufficiency 07/14/2017  . PAD (peripheral artery disease) ([place]) 07/14/2017  . Postprocedural hemorrhage due to complication of oral surgery 03/27/2017  . Acute blood loss anemia 03/27/2017  . H/O mitral valve replacement with mechanical valve 03/27/2017  . Atypical atrial flutter ([place]) 01/23/2017  . Long term current use of anticoagulants with INR goal of 2.5-3.5 09/18/2016  . Syncope 06/25/2016  . UTI (urinary tract infection) 06/25/2016  . Afib ([place]) 05/10/2016  . Dyspnea 05/10/2016  . Chronic diastolic CHF (congestive heart failure) ([place]) 05/10/2016  . Anemia 05/10/2016  . Anemia 05/10/2016  . Other specified abnormal immunological findings in serum 04/24/2016  . Sepsis ([place]) 04/01/2016  . Prolonged Q-T interval on ECG 03/13/2016  . Mitral stenosis 03/13/2016  . Interstitial lung disease ([place]) 12/29/2015  . Chronic LBP 07/26/2015  . Essential (primary) hypertension 07/26/2015  . Idiopathic insomnia 07/26/2015  . Restless leg 07/26/2015  . Obesity (BMI 30-39.9) 01/07/2015  . Abnormal EKG 09/11/2014  . External nasal lesion 02/26/2014  . Abnormal liver function tests 01/28/2014  . Ankle pain, right 01/28/2014  . Arthralgia of ankle or foot 01/28/2014  . Urinary frequency 12/24/2013  . Hemorrhoids 12/24/2013  .  Neck pain  on right side 11/10/2013  . Vitamin D deficiency 09/09/2013  . Urinary incontinence 05/30/2013  . History of colonic polyps 05/30/2013  . Controlled type 2 diabetes mellitus without complication ([place]) 00/34/9179  . H/O deep venous thrombosis 05/05/2013  . History of pulmonary embolism 05/05/2013  . 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 OSA (obstructive sleep apnea) 05/05/2013  . Depression 04/13/2009  . THYROID NODULE 04/13/2009  . 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 GERD 04/13/2009  . OSTEOARTHRITIS 04/13/2009  . BACK PAIN 04/13/2009  . HEADACHE 04/13/2009  . Gastro-esophageal reflux disease without esophagitis 04/13/2009  . Mild episode of recurrent major depressive disorder ([place]) 04/13/2009  . Benign hypertensive heart disease with heart failure ([place]) 03/28/2009  . PAF (paroxysmal atrial fibrillation) ([place]) 03/28/2009  . VENTRICULAR HYPERTROPHY, LEFT 03/28/2009  . Hypertensive cardiomyopathy, with heart failure ([place]) 03/28/2009    Palliative Care Assessment & Plan   Patient Profile: 72 y.o. female  with past medical history of afib, ANCA associated vasculitis, COPD, DM, pulmonary alveolar hemorrhage, HTN, skin cancer, OA,mechanical mitral valve on Lovenox, and frequent UTIs admitted on 10/26/2018 with temperature of 103, lethargy, and hypoxia. She was diagnosed with a UTI and sepsis. Also with possible pneumonia. She is presently on immunosuppressive therapy d/t history of ANCA associated vasculitis. Patient is twitching and has horizontal rolling eye movements. PMT consulted by Dr. Manuella Ghazi for goals of care.  Assessment: Klebsiella Sepsis due to UTI Acute on chronic diastolic CHF PAF Mechanical mitral valve Abdominal wall hematoma COPD  Recommendations/Plan:  FULL code/FULL scope treatment  Continue close monitoring for worsening abdominal wall hematoma.  May benefit from outpatient palliative f/u with underlying co-morbidities.   Goals of Care and  Additional Recommendations:  Limitations on Scope of Treatment: Full Scope Treatment  Code Status: FULL   Code Status Orders  (From admission, onward)         Start     Ordered   10/27/18 0153  Full code  Continuous     10/27/18 0152        Code Status History    Date Active Date Inactive Code Status Order ID Comments User Context   02/19/2018 1034 03/01/2018 1852 Full Code [id number]  Bettey Costa, MD Inpatient   01/27/2018 0603 02/04/2018 1634 Full Code [id number]  Saundra Shelling, MD ED   07/20/2017 0437 07/20/2017 1940 Full Code [id number]  Harrie Foreman, MD Inpatient   05/25/2017 1951 05/27/2017 1734 Full Code [id number]  Gladstone Lighter, MD Inpatient   03/27/2017 2251 04/09/2017 1549 Full Code [id number]  Lance Coon, MD Inpatient   06/26/2016 0106 06/28/2016 1639 Full Code [id number]  Lance Coon, MD Inpatient   05/10/2016 1638 05/12/2016 1659 Full Code [id number]  Theodoro Grist, MD ED   04/01/2016 1713 04/03/2016 0558 Full Code [id number]  Henreitta Leber, MD Inpatient    Advance Directive Documentation     Most Recent Value  Type of Advance Directive  Healthcare Power of Attorney, Living will  Pre-existing out of facility DNR order (yellow form or pink MOST form)  -  "MOST" Form in Place?  -       Prognosis:   Unable to determine  Discharge Planning:  Home with [place] was discussed with patient and Dr. Manuella Ghazi  Thank you for allowing the Palliative Medicine Team to assist in the care of this patient.   Time In: 1340 Time Out: 1405 Total Time 57min Prolonged Time Billed no      Greater  than 50%  of this time was spent counseling and coordinating care related to the above assessment and plan.   Please contact Palliative Medicine Team phone at [phone] for questions and concerns.

## 2018-11-02 NOTE — Progress Notes (Signed)
Seen by vascular team but no planned intervention at this time.Patient remains stable this shift

## 2018-11-02 NOTE — Progress Notes (Signed)
ANTICOAGULATION CONSULT NOTE -  Pharmacy Consult for warfarin/heparin bridge Indication: atrial fibrillation/mechanical mitral valve  Allergies  Allergen Reactions  . Flecainide Shortness Of Breath and Other (See Comments)    Reaction: dizziness   . Amiodarone Other (See Comments)    Pt states that this medication causes lung bleeding.      Patient Measurements: Height: 5\' 9"  (175.3 cm) Weight: 208 lb 6.4 oz (94.5 kg) IBW/kg (Calculated) : 66.2 Heparin Dosing Weight: 87.7 kg  Vital Signs: Temp: 98.3 F (36.8 C) (11/11 0726) Temp Source: Oral (11/11 0726) BP: 118/55 (11/11 0726) Pulse Rate: 76 (11/11 0726)  Labs: Recent Labs    10/31/18 0030 10/31/18 1003 11/01/18 0436 11/02/18 0435  HGB 12.2  --  12.9 12.6  HCT 38.8  --  41.3 40.6  PLT 184  --  229 287  LABPROT 19.4*  --  23.7* 24.1*  INR 1.66  --  2.15 2.20  HEPARINUNFRC 0.42 0.36 0.38  --   CREATININE 0.63  --  0.74 0.63    Estimated Creatinine Clearance: 78.9 mL/min (by C-G formula based on SCr of 0.63 mg/dL).   Medical History: Past Medical History:  Diagnosis Date  . Acute diastolic heart failure ([place])   . Allergy   . 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 Asthma   . Atrial fibrillation ([place])   . Backache, unspecified   . Cancer ([place])    skin  . Cardiomegaly   . COPD (chronic obstructive pulmonary disease) ([place])   . Diabetes mellitus without complication ([place])   . 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 Esophageal reflux   . Headache(784.0)   . Herpes zoster without mention of complication   . Hx: UTI (urinary tract infection)   . Hypertension    heart controlled w CHF  . Nontoxic uninodular goiter   . Obesity, unspecified   . Osteoarthrosis, unspecified whether generalized or localized, unspecified site   . Unspecified sleep apnea   . Urine incontinence    hx of    Medications:  Scheduled:  . atorvastatin  20 mg Oral Daily  . gabapentin  300 mg Oral Q1200  .  gabapentin  600 mg Oral BID  . insulin aspart  0-5 Units Subcutaneous QHS  . insulin aspart  0-9 Units Subcutaneous TID WC  . metFORMIN  500 mg Oral BID WC  . metoprolol tartrate  25 mg Oral BID  . pantoprazole  40 mg Oral Daily  . potassium chloride  40 mEq Oral BID  . rOPINIRole  2 mg Oral QHS  . senna  1 tablet Oral Daily  . sodium chloride flush  3 mL Intravenous Q12H  . spironolactone  12.5 mg Oral Daily  . torsemide  80 mg Oral BID  . venlafaxine XR  150 mg Oral Daily  . warfarin  7 mg Oral Once per day on Mon Tue Wed Thu Fri  . [START ON 11/07/2018] warfarin  8 mg Oral Once per day on Sun Sat  . Warfarin - Pharmacist Dosing Inpatient   Does not apply q1800    Assessment: Patient admitted for SOB s/t to pneumonia was called a code sepsis. Patient has as h/o afib and has a mechanical mitral valve. Patient takes warfarin 7mg  Monday - Friday and warfarin 8mg  Saturday and Sunday. Patient unsure of last dose. Patient currently being bridged with heparin at 1250 units/hr, will continue. Patient is currently receiving Merrem, but did receive Cefepime and Vancomycin 11/5.  Only interaction is increase of INR with Cefepime.   11/5   INR 1.50     Warfarin 10.5mg  11/6   INR 1.45     Warfarin 8mg  (dose given? - didn't get documented on MAR) 11/7   INR 1.41     Warfarin 8mg  11/8   INR 1.34     Warfarin 8mg  11/9   INR 1.66     Warfarin 8 mg 11/10 INR 2.15     Warfarin Held (?abdominal hematoma) 11/11 INR 2.20   Goal of Therapy:  INR 2.5 - 3.5 Monitor platelets by anticoagulation protocol: Yes   Plan:  INR is up to 2.20 this AM however yesterday's dose was held, very close to goal Will continue with home dose (warfarin 7mg  Monday - Friday and warfarin 8mg  Saturday and Sunday). Heparin drip was discontinued last evening by MD.  Paulina Fusi, PharmD, BCPS 11/02/2018 4:07 PM

## 2018-11-02 NOTE — Progress Notes (Signed)
[facility] [place] NAME: Betty [name]    MR#:  [id number]  DATE OF BIRTH:  03-31-1946  SUBJECTIVE:  CHIEF COMPLAINT:   Chief Complaint  Patient presents with  . Code Sepsis  . Fatigue  making progress. Has stable hematoma  REVIEW OF SYSTEMS:  Review of Systems  Constitutional: Negative for chills, diaphoresis, fever, malaise/fatigue and weight loss.  HENT: Negative for ear discharge, ear pain, hearing loss, nosebleeds, sore throat and tinnitus.   Eyes: Negative for blurred vision and pain.  Respiratory: Negative for cough, hemoptysis, shortness of breath and wheezing.   Cardiovascular: Negative for chest pain, palpitations, orthopnea and leg swelling.  Gastrointestinal: Negative for abdominal pain, blood in stool, constipation, diarrhea, heartburn, nausea and vomiting.  Genitourinary: Negative for dysuria, frequency and urgency.  Musculoskeletal: Negative for back pain and myalgias.  Skin: Negative for itching and rash.  Neurological: Negative for dizziness, tingling, tremors, focal weakness, seizures, weakness and headaches.  Psychiatric/Behavioral: Negative for depression. The patient is not nervous/anxious.    DRUG ALLERGIES:   Allergies  Allergen Reactions  . Flecainide Shortness Of Breath and Other (See Comments)    Reaction: dizziness   . Amiodarone Other (See Comments)    Pt states that this medication causes lung bleeding.     VITALS:  Blood pressure (!) 118/55, pulse 76, temperature 98.3 F (36.8 C), temperature source Oral, resp. rate 18, height 5\' 9"  (1.753 m), weight 94.5 kg, SpO2 91 %. PHYSICAL EXAMINATION:  Physical Exam  HENT:  Head: Normocephalic and atraumatic.  Eyes: Pupils are equal, round, and reactive to light. Conjunctivae and EOM are normal.  Neck: Normal range of motion. Neck supple. No tracheal deviation present. No thyromegaly present.  Cardiovascular: Normal rate, regular rhythm and normal heart  sounds.  Pulmonary/Chest: Effort normal and breath sounds normal. No respiratory distress. She has no wheezes. She exhibits no tenderness.  Abdominal: Soft. Bowel sounds are normal. She exhibits no distension. There is no tenderness.  Musculoskeletal: Normal range of motion.  Neurological: She is not disoriented. No cranial nerve deficit.  Skin: Skin is warm and dry. No rash noted.  Psychiatric: Her affect is not inappropriate.   LABORATORY PANEL:  Female CBC Recent Labs  Lab 11/02/18 0435  WBC 8.8  HGB 12.6  HCT 40.6  PLT 287   ------------------------------------------------------------------------------------------------------------------ Chemistries  Recent Labs  Lab 10/26/18 2154  11/01/18 0436 11/02/18 0435  NA 134*   < > 139 138  K 3.4*   < > 3.5 4.5  CL 92*   < > 95* 94*  CO2 31   < > 35* 35*  GLUCOSE 161*   < > 195* 195*  BUN 28*   < > 21 20  CREATININE 1.15*   < > 0.74 0.63  CALCIUM 8.8*   < > 8.6* 9.3  MG  --    < > 2.2  --   AST 39  --   --   --   ALT 21  --   --   --   ALKPHOS 109  --   --   --   BILITOT 1.2  --   --   --    < > = values in this interval not displayed.   RADIOLOGY:  Ct Abdomen Pelvis W Contrast  Result Date: 11/01/2018 CLINICAL DATA:  Acute onset of severe generalized abdominal pain and nausea. EXAM: CT ABDOMEN AND PELVIS WITH CONTRAST TECHNIQUE: Multidetector CT imaging of  the abdomen and pelvis was performed using the standard protocol following bolus administration of intravenous contrast. CONTRAST:  153mL ISOVUE-300 IOPAMIDOL (ISOVUE-300) INJECTION 61% COMPARISON:  Limited pelvic ultrasound performed earlier today at 1:30 p.m. FINDINGS: Lower chest: Trace right-sided pleural fluid is noted. Mild bibasilar atelectasis or scarring is seen, with increased interstitial markings. Would correlate clinically to exclude mild pulmonary edema. The patient is status post median sternotomy. Pacemaker leads and a mitral valve replacement are seen.  Hepatobiliary: The nodular contour of the liver raises concern for mild hepatic cirrhosis. The patient is status post cholecystectomy, with clips noted at the gallbladder fossa. The common bile duct remains normal in caliber. Pancreas: The pancreas is within normal limits. Spleen: The spleen is unremarkable in appearance. Adrenals/Urinary Tract: The adrenal glands are unremarkable in appearance. The kidneys are within normal limits. There is no evidence of hydronephrosis. No renal or ureteral stones are identified. No perinephric stranding is seen. Stomach/Bowel: The stomach is unremarkable in appearance. The small bowel is within normal limits. The appendix is normal in caliber, without evidence of appendicitis. Scattered diverticulosis is noted along the descending and sigmoid colon, without evidence of diverticulitis. Vascular/Lymphatic: Diffuse calcification is seen along the abdominal aorta and its branches. The abdominal aorta is otherwise grossly unremarkable. The inferior vena cava is grossly unremarkable. No retroperitoneal lymphadenopathy is seen. No pelvic sidewall lymphadenopathy is identified. Reproductive: The bladder is mildly distended and grossly unremarkable. The patient is status post hysterectomy. No suspicious adnexal masses are seen. Other: A tiny umbilical hernia is noted, containing only fat. A metallic device is noted at the right flank, with a lead extending into the right hemipelvis. The complex collection of fluid along the left lower quadrant anterior abdominal wall is interspersed about the subcutaneous fat and most likely reflects superficial soft tissue hemorrhage, given its appearance, measuring approximately 14.4 x 2.5 cm. No definite abscess is seen. Additional small foci of inflammation along the abdominal wall likely reflect injection sites. Musculoskeletal: No acute osseous abnormalities are identified. There is mild chronic loss of height at vertebral body L1, with underlying  facet disease. The visualized musculature is unremarkable in appearance. IMPRESSION: 1. Complex collection of fluid along the left lower quadrant anterior abdominal wall is interspersed about the subcutaneous fat and most likely reflects superficial soft tissue hemorrhage, given its appearance, measuring approximately 14.4 x 2.5 cm. No abscess seen. 2. Trace right-sided pleural fluid. Mild bibasilar atelectasis or scarring, with increased interstitial markings. Would correlate clinically to exclude mild pulmonary edema. 3. Scattered diverticulosis along the descending and sigmoid colon, without evidence of diverticulitis. 4. Tiny umbilical hernia, containing only fat. 5. Nodular contour of the liver raises concern for mild hepatic cirrhosis. Aortic Atherosclerosis (ICD10-I70.0). Electronically Signed   By: Garald Balding M.D.   On: 11/01/2018 22:15   ASSESSMENT AND PLAN:  21  y f admitted for sepsis due to UTI  * Klebsiella Sepsis ([place]) -present on admission due to UTI and possible pneumonia - on Rocephin - BCID + for Klebsiella  * UTI (urinary tract infection) -IV Rocephin for now, can be stopped at D/C  * Acute on chronic diastolic CHF - continue Home dose torsemide (80 BID) and spironolactone (I confirmed dose with her retail pharmacy) Has hypokalemia due to diuretics, continue to replace potassium, potassium better   * PAF (paroxysmal atrial fibrillation) ([place]) -continue home rate controlling medications and anticoagulation   * Mechanical mitral valve -continue heparin drip until INR is 2.5.  Patient INR is close to  goal, 2.2 today.  * Soft Tissue Hemorrhage of Abdomen: stable H & H. Appreciate Vascular input. No further intervention  * Hypokalemia - repleted and resolved  * Controlled type 2 diabetes mellitus without complication (HCC) -sliding scale insulin with corresponding glucose checks restart metformin today.  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   HHPT at D/C   All the records are reviewed and case discussed with Care Management/Social Worker. Management plans discussed with the patient, nursing and they are in agreement.  CODE STATUS: Full Code  TOTAL TIME TAKING CARE OF THIS PATIENT: 35 minutes.   More than 50% of the time was spent in counseling/coordination of care: YES  POSSIBLE D/C IN 1 DAYS, DEPENDING ON CLINICAL CONDITION.   Max Sane M.D on 11/02/2018 at 2:53 PM  Between 7am to 6pm - Pager - [phone]  After 6pm go to [email or web address] - Proofreader  [facility]  Office  [phone]  CC: Primary care physician; Dion Body, MD  Note: This dictation was prepared with Dragon dictation along with smaller phrase technology. Any transcriptional errors that result from this process are unintentional.

## 2018-11-02 NOTE — Progress Notes (Signed)
ANTICOAGULATION CONSULT NOTE -  Pharmacy Consult for warfarin/heparin bridge Indication: atrial fibrillation/mechanical mitral valve  Allergies  Allergen Reactions  . Flecainide Shortness Of Breath and Other (See Comments)    Reaction: dizziness   . Amiodarone Other (See Comments)    Pt states that this medication causes lung bleeding.      Patient Measurements: Height: 5\' 9"  (175.3 cm) Weight: 209 lb 7 oz (95 kg) IBW/kg (Calculated) : 66.2 Heparin Dosing Weight: 87.7 kg  Vital Signs: Temp: 98.3 F (36.8 C) (11/10 2001) Temp Source: Oral (11/10 2001) BP: 115/70 (11/10 2001) Pulse Rate: 72 (11/10 2001)  Labs: Recent Labs    10/30/18 0540  10/31/18 0030 10/31/18 1003 11/01/18 0436  HGB 11.7*  --  12.2  --  12.9  HCT 38.5  --  38.8  --  41.3  PLT 178  --  184  --  229  LABPROT 16.4*  --  19.4*  --  23.7*  INR 1.34  --  1.66  --  2.15  HEPARINUNFRC 0.23*   < > 0.42 0.36 0.38  CREATININE  --   --  0.63  --  0.74   < > = values in this interval not displayed.    Estimated Creatinine Clearance: 79.1 mL/min (by C-G formula based on SCr of 0.74 mg/dL).   Medical History: Past Medical History:  Diagnosis Date  . Acute diastolic heart failure ([place])   . Allergy   . 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 Asthma   . Atrial fibrillation ([place])   . Backache, unspecified   . Cancer ([place])    skin  . Cardiomegaly   . COPD (chronic obstructive pulmonary disease) ([place])   . Diabetes mellitus without complication ([place])   . 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 Esophageal reflux   . Headache(784.0)   . Herpes zoster without mention of complication   . Hx: UTI (urinary tract infection)   . Hypertension    heart controlled w CHF  . Nontoxic uninodular goiter   . Obesity, unspecified   . Osteoarthrosis, unspecified whether generalized or localized, unspecified site   . Unspecified sleep apnea   . Urine incontinence    hx of    Medications:   Scheduled:  . atorvastatin  20 mg Oral Daily  . gabapentin  300 mg Oral Q1200  . gabapentin  600 mg Oral BID  . insulin aspart  0-5 Units Subcutaneous QHS  . insulin aspart  0-9 Units Subcutaneous TID WC  . metFORMIN  500 mg Oral BID WC  . metoprolol tartrate  25 mg Oral BID  . pantoprazole  40 mg Oral Daily  . potassium chloride  40 mEq Oral BID  . rOPINIRole  2 mg Oral QHS  . senna  1 tablet Oral Daily  . sodium chloride flush  3 mL Intravenous Q12H  . spironolactone  12.5 mg Oral Daily  . torsemide  80 mg Oral BID  . venlafaxine XR  150 mg Oral Daily  . warfarin  7 mg Oral Once per day on Mon Tue Wed Thu Fri  . [START ON 11/07/2018] warfarin  8 mg Oral Once per day on Sun Sat  . Warfarin - Pharmacist Dosing Inpatient   Does not apply q1800    Assessment: Patient admitted for SOB s/t to pneumonia was called a code sepsis. Patient has as h/o afib and has a mechanical mitral valve. Patient takes warfarin 7mg  Monday - Friday  and warfarin 8mg  Saturday and Sunday. Patient unsure of last dose. Patient currently being bridged with heparin at 1250 units/hr, will continue. Patient is currently receiving Merrem, but did receive Cefepime and Vancomycin 11/5. Only interaction is increase of INR with Cefepime.   11/5   INR 1.50     Warfarin 10.5mg  11/6   INR 1.45     Warfarin 8mg  (dose given? - didn't get documented on MAR) 11/7   INR 1.41     Warfarin 8mg  11/8   INR 1.34     Warfarin 8mg  11/9   INR 1.66   Goal of Therapy:  INR 2.5 - 3.5 Monitor platelets by anticoagulation protocol: Yes   Plan:  11/11 @ 0000 heparin bridge and warfarin being held and NPO after midnight for abdominal procedure of a left lower abdominal subcutaneous hemorrhage. Will d/c heparin level since this will be of no use and will f/u on restart of heparin drip and continue to monitor HL and INR.   Tobie Lords, PharmD, BCPS Clinical Pharmacist 11/02/2018

## 2018-11-03 DIAGNOSIS — R109 Unspecified abdominal pain: Secondary | ICD-10-CM

## 2018-11-03 LAB — BASIC METABOLIC PANEL
Anion gap: 9 (ref 5–15)
BUN: 26 mg/dL — ABNORMAL HIGH (ref 8–23)
CALCIUM: 9.2 mg/dL (ref 8.9–10.3)
CO2: 35 mmol/L — AB (ref 22–32)
Chloride: 95 mmol/L — ABNORMAL LOW (ref 98–111)
Creatinine, Ser: 0.64 mg/dL (ref 0.44–1.00)
GFR calc Af Amer: >60 mL/min (ref >60)
GFR calc non Af Amer: >60 mL/min (ref >60)
GLUCOSE: 187 mg/dL — AB (ref 70–99)
Potassium: 3.9 mmol/L (ref 3.5–5.1)
Sodium: 139 mmol/L (ref 135–145)

## 2018-11-03 LAB — GLUCOSE, CAPILLARY
Glucose-Capillary: 170 mg/dL — ABNORMAL HIGH (ref 70–99)
Glucose-Capillary: 293 mg/dL — ABNORMAL HIGH (ref 70–99)

## 2018-11-03 LAB — CBC
HEMATOCRIT: 41.1 % (ref 36.0–46.0)
Hemoglobin: 12.7 g/dL (ref 12.0–15.0)
MCH: 28.8 pg (ref 26.0–34.0)
MCHC: 30.9 g/dL (ref 30.0–36.0)
MCV: 93.2 fL (ref 80.0–100.0)
Platelets: 307 10*3/uL (ref 150–400)
RBC: 4.41 MIL/uL (ref 3.87–5.11)
RDW: 16 % — AB (ref 11.5–15.5)
WBC: 9.4 10*3/uL (ref 4.0–10.5)
nRBC: 0 % (ref 0.0–0.2)

## 2018-11-03 LAB — PROTIME-INR
INR: 1.92
Prothrombin Time: 21.7 seconds — ABNORMAL HIGH (ref 11.4–15.2)

## 2018-11-03 NOTE — Progress Notes (Addendum)
SURGICAL PROGRESS NOTE (cpt: [phone])  Patient seen and examined as described below with surgical PA-C, Ardell Isaacs.  Assessment/Plan: (ICD-10's: M79.81) In summary, patient is a 72 y.o. female with LLQ abdominal wall hematoma, complicated by comorbidities including DM, HTN, chronic therapeutic anticoagulation for mechanical heart valve replacement and atrial fibrillation, CHF, PAD, COPD, obstructive sleep apnea, and former tobacco abuse (smoking).   - currently no indication for surgical intervention   - pain control, monitor serial Hb/Hct while inpatient  - medical management of comorbidities and disposition as per primary medical team  - will signoff, please call if any questions or concerns  - ambulation encouraged  I have personally reviewed the patient's chart, evaluated/examined the patient, proposed the recommended management, and discussed these recommendations with the patient to her expressed satisfaction as well as with patient's RN.  Thank you for the opportunity to participate in this patient's care.  -- Marilynne Drivers Rosana Hoes, MD, [place]: [facility] - Partnering for exceptional care. Office: [phone]      SURGICAL PROGRESS NOTE (cpt [phone])  Hospital Day(s): 8.   Post op day(s):  Marland Kitchen   Interval History: Patient seen and examined, no acute events or new complaints overnight. Patient reports some unchanged abdominal soreness, denies fever, chills, nausea or emesis. Tolerating a diet. .  Review of Systems:  Constitutional: denies fever, chills  HEENT: denies cough or congestion  Respiratory: denies any shortness of breath  Cardiovascular: denies chest pain or palpitations  Gastrointestinal: denies abdominal pain, N/V, or diarrhea/and bowel function as per interval history Genitourinary: denies burning with urination or urinary frequency Musculoskeletal: denies pain, decreased motor or sensation Integumentary: denies any other  rashes or skin discolorations except + LLQ Hematoma Neurological: denies HA or vision/hearing changes   Vital signs in last 24 hours: [min-max] current  Temp:  [97.5 F (36.4 C)-98.9 F (37.2 C)] 98.4 F (36.9 C) (11/12 0721) Pulse Rate:  [74-83] 83 (11/12 0721) Resp:  [17-19] 17 (11/12 0558) BP: (112-126)/(54-110) 113/65 (11/12 0721) SpO2:  [90 %-99 %] 99 % (11/12 0721) Weight:  [91.9 kg] 91.9 kg (11/12 0500)     Height: 5\' 9"  (175.3 cm) Weight: 91.9 kg BMI (Calculated): 29.89   Intake/Output this shift:  No intake/output data recorded.   Intake/Output last 2 shifts:  @IOLAST2SHIFTS @   Physical Exam:  Constitutional: alert, cooperative and no distress  HENT: normocephalic without obvious abnormality  Eyes: EOM's grossly intact and symmetric  Respiratory: breathing non-labored at rest  Gastrointestinal: soft, non-tender, and non-distended Integumentary: Large area of ecchymosis and induration to the LLQ, non-tender to palpation, overlying warmth and erythema of the skin Musculoskeletal: UE and LE FROM, no edema or wounds, motor and sensation grossly intact, NT   Labs:  CBC Latest Ref Rng & Units 11/03/2018 11/02/2018 11/01/2018  WBC 4.0 - 10.5 K/uL 9.4 8.8 7.6  Hemoglobin 12.0 - 15.0 g/dL 12.7 12.6 12.9  Hematocrit 36.0 - 46.0 % 41.1 40.6 41.3  Platelets 150 - 400 K/uL 307 287 229   CMP Latest Ref Rng & Units 11/03/2018 11/02/2018 11/01/2018  Glucose 70 - 99 mg/dL 187(H) 195(H) 195(H)  BUN 8 - 23 mg/dL 26(H) 20 21  Creatinine 0.44 - 1.00 mg/dL 0.64 0.63 0.74  Sodium 135 - 145 mmol/L 139 138 139  Potassium 3.5 - 5.1 mmol/L 3.9 4.5 3.5  Chloride 98 - 111 mmol/L 95(L) 94(L) 95(L)  CO2 22 - 32 mmol/L 35(H) 35(H) 35(H)  Calcium 8.9 - 10.3 mg/dL 9.2 9.3  8.6(L)  Total Protein 6.5 - 8.1 g/dL - - -  Total Bilirubin 0.3 - 1.2 mg/dL - - -  Alkaline Phos 38 - 126 U/L - - -  AST 15 - 41 U/L - - -  ALT 0 - 44 U/L - - -   Imaging studies: No new pertinent imaging  studies  Assessment/Plan: (ICD 10: M79.81) Betty [name] is a 72 y.o. female with left lower quadrant abdominal wall hematoma In summary, patient is a 72 y.o. female with LLQ abdominal wall hematoma, complicated by comorbidities including DM, HTN, chronic therapeutic anticoagulation for mechanical heart valve replacement and atrial fibrillation, CHF, PAD, COPD, obstructive sleep apnea, and former tobacco abuse (smoking).   - Monitor abdominal examination  - H&H stable, would follow while hospitalized   - INR is 1.92, follow given need for warfarin and mechanical heart valve   - No indication for emergent surgical intervention  - General surgery will sign off. Please re-consult if further needs arise.    All of the above findings and recommendations were discussed with the patient, and the medical team, and all of patient's questions were answered to her expressed satisfaction.  -- Edison Simon, PA-C [facility] 11/03/2018, 7:53 AM [phone] M-F: 7am - 4pm

## 2018-11-03 NOTE — Discharge Instructions (Signed)
Sepsis, Adult Sepsis is a serious bodily reaction to an infection. The infection that causes sepsis may be from a bacteria, a virus, a fungus, or a parasite. Sepsis can result from an infection in any part of the body. Infections that commonly lead to sepsis include skin, lung, and urinary tract infections. Sepsis is a medical emergency that requires immediate treatment at the hospital. In severe cases, it can lead to septic shock. Shock can weaken the heart and cause blood pressure to drop. This can make the central nervous system and the body's organs to stop working. What are the causes? This condition is caused by a severe reaction to a bacterial, viral, fungal, or parasitic infection. The germs that most commonly lead to sepsis include:  Escherichia coli (E. coli).  Staphylococcus aureus (staph).  The most common infections that lead to sepsis include infections of:  The skin.  The lung (pneumonia).  The gut.  The kidneys (urinary tract infection).  What increases the risk? You are more likely to develop this condition if:  You have a weakened disease-fighting (immune) system.  You are 65 or older.  You are female.  You had surgery, or you have been hospitalized.  You have a catheter, breathing tube, or drainage tubes inserted into your body.  You are not getting enough nutrients from food (are malnourished).  You have other long-term (chronic) diseases, including: ? Cancer. ? AIDS. ? Liver disease. ? Lung disease. ? Diabetes.  You have severe burns or injuries.  You inject drugs.  You have heart valve problems.  What are the signs or symptoms? Symptoms of this condition may include:  Fever.  Chills or feeling very cold.  Fast heart rate (tachycardia).  Rapid breathing (hyperventilation).  Shortness of breath.  Confusion or light-headedness.  Changes in skin color. Your skin may look blotchy, pale, or blue.  Cool, clammy skin or sweaty skin.  Skin  rash.  Nausea and vomiting.  Urinating much less than usual.  How is this diagnosed? This condition is diagnosed based on:  Your symptoms.  Your medical history.  A physical exam.  Other tests may also be done to find out the cause of the infection and how severe the sepsis is. These tests may include:  Blood tests.  Urine tests.  Swabs from other areas of the body that may have an infection. These samples may be tested (cultured) to find out what type of bacteria is causing the infection.  Chest X-ray to check for pneumonia. Other imaging tests, such as a CT scan, may also be done.  Lumbar puncture. This is a procedure to remove a small amount of the fluid that surrounds the brain and spinal cord. The fluid is then examined for infection.  How is this treated? This condition is treated in a hospital with antibiotic medicines. You may also receive:  Fluids through an IV tube.  Oxygen and breathing assistance.  Kidney dialysis. This process cleans the blood if the kidneys have failed.  Surgery to remove infected tissue.  Medicines to increase your blood pressure.  Nutrients to correct imbalances in basic body function (metabolism). This may involve receiving important salts and minerals (electrolytes) through an IV and having your blood sugar level adjusted.  Steroid medicines to control your body's reaction to the infection.  Follow these instructions at home: Medicines  Take over-the-counter and prescription medicines only as told by your health care provider.  If you were prescribed an antibiotic or anti-fungal medicine, take it   as told by your health care provider. Do not stop taking the antibiotic or anti-fungal medicine even if you start to feel better. Activity  Rest and gradually return to your normal activities. Ask your health care provider what activities are safe for you.  Try to set small, achievable goals each week, such as dressing yourself,  bathing, or walking up stairs. It may take a while to rebuild your strength.  Try to exercise regularly, if you feel healthy enough to do so. Ask your health care provider what exercises are safe for you. General instructions  Drink enough fluid to keep your urine clear or pale yellow.  Eat a healthy, balanced diet. This includes plenty of fruits and vegetables, whole grains, and lowfat (lean) proteins. Ask your health care provider if you should avoid certain foods.  Keep all follow-up visits as told by your health care provider. This is important. Contact a health care provider if:  You do not feel like you are getting better or regaining strength.  You are having trouble coping with your recovery.  You frequently feel tired.  You feel worse or do not seem to get better after surgery.  You think you may have an infection after surgery. Get help right away if:  You have any symptoms of sepsis.  You have difficulty breathing.  You have a rapid or skipping heartbeat.  You become confused.  You have a high fever.  Your skin becomes blotchy, pale, or blue. These symptoms may represent a serious problem that is an emergency. Do not wait to see if the symptoms will go away. Get medical help right away. Call your local emergency services (911 in the [place] Summary  Sepsis is a medical emergency that requires immediate treatment at the hospital.  This condition is caused by a severe reaction to a bacterial, viral, fungal, or parasitic infection.  This condition is treated in a hospital with antibiotics. Treatment may also include IV fluids, breathing assistance, and kidney dialysis.  If you were prescribed an antibiotic or anti-fungal medicine, take it as told by your health care provider. Do not stop taking the antibiotic or anti-fungal medicine even if you start to feel better. This information is not intended to replace advice given to you by your health care provider. Make  sure you discuss any questions you have with your health care provider. Document Released: 09/07/2003 Document Revised: 11/12/2016 Document Reviewed: 11/12/2016 Elsevier Interactive Patient Education  2018 [employer]  

## 2018-11-03 NOTE — Care Management Note (Signed)
Case Management Note  Patient Details  Name: Betty [name] MRN: [id number] Date of Birth: [birth year]  Subjective/Objective:     Patient is discharging today with home health.  [place] with [facility] notified.                 Action/Plan:   Expected Discharge Date:                  Expected Discharge Plan:  [place]  In-House Referral:     Discharge planning Services  CM Consult  Post Acute Care Choice:    Choice offered to:     DME Arranged:    DME Agency:     HH Arranged:  RN, PT [place] Agency:  [facility]  Status of Service:  Completed, signed off  If discussed at [place] of Stay Meetings, dates discussed:    Additional Comments:  Elza Rafter, RN 11/03/2018, 9:56 AM

## 2018-11-03 NOTE — Progress Notes (Signed)
Discharged to home with her son. No new medications.  Reviewed med list and times for each med carefully.  She didn't seem to grasp which meds she still needs to take this afternoon and evening.

## 2018-11-05 LAB — CULTURE, BLOOD (ROUTINE X 2)
CULTURE: NO GROWTH
Culture: NO GROWTH
SPECIAL REQUESTS: ADEQUATE

## 2018-11-07 NOTE — Discharge Summary (Signed)
[facility] NAME: Amany Rando    MR#:  [id number]  DATE OF BIRTH:  1946-10-25  DATE OF ADMISSION:  10/26/2018   ADMITTING PHYSICIAN: Lance Coon, MD  DATE OF DISCHARGE: 11/03/2018  4:27 PM  PRIMARY CARE PHYSICIAN: Dion Body, MD   ADMISSION DIAGNOSIS:  Sepsis due to Salmonella species without acute organ dysfunction ([place]) [A02.1] DISCHARGE DIAGNOSIS:  Principal Problem:   Sepsis ([place]) Active Problems:   PAF (paroxysmal atrial fibrillation) (HCC)   Controlled type 2 diabetes mellitus without complication (HCC)   Essential (primary) hypertension   Chronic diastolic CHF (congestive heart failure) (HCC)   UTI (urinary tract infection)   COPD (chronic obstructive pulmonary disease) (HCC)   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 Acute diastolic heart failure ([place])   . Allergy   . 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 Asthma   . Atrial fibrillation ([place])   . Backache, unspecified   . Cancer ([place])    skin  . Cardiomegaly   . COPD (chronic obstructive pulmonary disease) ([place])   . Diabetes mellitus without complication ([place])   . 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 Esophageal reflux   . Headache(784.0)   . Herpes zoster without mention of complication   . Hx: UTI (urinary tract infection)   . Hypertension    heart controlled w CHF  . Nontoxic uninodular goiter   . Obesity, unspecified   . Osteoarthrosis, unspecified whether generalized or localized, unspecified site   . Unspecified sleep apnea   . Urine incontinence    hx of   HOSPITAL COURSE:  7  y female with LLQ abdominal wall hematoma, complicated by comorbidities including DM, HTN, chronic therapeutic anticoagulation for mechanical heart valve replacement and atrial  fibrillation, CHF, PAD, COPD, obstructive sleep apnea, and former tobacco abuse (smoking) admitted for sepsis due to UTI  * Klebsiella Sepsis ([place]) -present on admission due to UTI. Pneumonia ruled out - BCID + for Klebsiella improving on Abx  * UTI (urinary tract infection) -based on UA, treated. No Urine c/s  * Acute on chronic diastolic CHF - Diuresed well and back to baseline  *PAF (paroxysmal atrial fibrillation) (HCC) - Rate well controlled on home meds and anticoagulation   *Mechanical mitral valve -continue heparin drip until INR is 2.5.  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 DISCHARGE CONDITIONS:  stable CONSULTS OBTAINED:   DRUG ALLERGIES:   Allergies  Allergen Reactions  . Flecainide Shortness Of Breath and Other (See Comments)    Reaction: dizziness   . Amiodarone Other (See Comments)    Pt states that this medication causes lung bleeding.     DISCHARGE MEDICATIONS:   Allergies as of 11/03/2018      Reactions   Flecainide Shortness Of Breath, Other (See Comments)   Reaction: dizziness    Amiodarone Other (See Comments)   Pt states that this medication causes lung bleeding.  Medication List    STOP taking these medications   enoxaparin 100 MG/ML injection Commonly known as:  LOVENOX     TAKE these medications   acetaminophen 325 MG tablet Commonly known as:  TYLENOL Take 2 tablets (650 mg total) by mouth every 6 (six) hours as needed for mild pain (or Fever >/= 101). Notes to patient:  You had a dose this morning around 8:30   albuterol (2.5  MG/3ML) 0.083% nebulizer solution Commonly known as:  PROVENTIL Take 3 mLs (2.5 mg total) by nebulization every 4 (four) hours as needed for wheezing or shortness of breath.   atorvastatin 20 MG tablet Commonly known as:  LIPITOR Take 20 mg by mouth at bedtime.   azaTHIOprine 50 MG tablet Commonly known as:  IMURAN Take 100 mg by mouth daily.   ferrous sulfate 325 (65 FE) MG EC tablet Take 325 mg by mouth daily.   gabapentin 600 MG tablet Commonly known as:  NEURONTIN Take 300-600 mg by mouth 3 (three) times daily. Take 2 capsules (600mg ) in morning, 1 capsule (300mg ) at midday, and 2 capsules (600mg ) at bedtime   guaiFENesin-dextromethorphan 100-10 MG/5ML syrup Commonly known as:  ROBITUSSIN DM Take 5 mLs by mouth every 4 (four) hours as needed for cough.   levocetirizine 5 MG tablet Commonly known as:  XYZAL Take 5 mg by mouth at bedtime.   metFORMIN 1000 MG tablet Commonly known as:  GLUCOPHAGE Take 500 mg by mouth 2 (two) times daily with a meal.   metolazone 2.5 MG tablet Commonly known as:  ZAROXOLYN Take 2.5 mg by mouth 2 (two) times a week. Take 2.5 mg by mouth on Monday and Thursday.   metoprolol tartrate 25 MG tablet Commonly known as:  LOPRESSOR Take 25 mg by mouth 2 (two) times daily.   pantoprazole 40 MG tablet Commonly known as:  PROTONIX Take 40 mg by mouth daily.   potassium chloride 10 MEQ CR capsule Commonly known as:  MICRO-K Take 20 mEq by mouth 2 (two) times daily. Notes to patient:  TAKE WITH FOOD   ranitidine 150 MG tablet Commonly known as:  ZANTAC Take 150 mg by mouth 2 (two) times daily.   rOPINIRole 1 MG tablet Commonly known as:  REQUIP Take 1 mg by mouth at bedtime.   spironolactone 25 MG tablet Commonly known as:  ALDACTONE Take 12.5 mg by mouth daily.   torsemide 20 MG tablet Commonly known as:  DEMADEX Take 80 mg by mouth 2 (two) times daily.   traZODone 50 MG tablet Commonly known as:  DESYREL Take 50 mg by mouth at  bedtime.   venlafaxine XR 150 MG 24 hr capsule Commonly known as:  EFFEXOR-XR Take 150 mg by mouth daily.   Vitamin D (Ergocalciferol) 1.25 MG (50000 UT) Caps capsule Commonly known as:  DRISDOL Take 50,000 Units by mouth once a week. Notes to patient:  RETURN TO YOUR REGULAR SCHEDULE   warfarin 4 MG tablet Commonly known as:  COUMADIN Take 8 mg by mouth daily. Monday - Friday   warfarin 3 MG tablet Commonly known as:  COUMADIN Take 3 mg by mouth daily. On Saturday and Sunday        DISCHARGE INSTRUCTIONS:   DIET:  Regular diet DISCHARGE CONDITION:  Good ACTIVITY:  Activity as tolerated OXYGEN:  Home Oxygen: No.  Oxygen Delivery: room air DISCHARGE LOCATION:  home with home health (well care)  If you experience worsening of your admission symptoms, develop shortness of breath,  life threatening emergency, suicidal or homicidal thoughts you must seek medical attention immediately by calling 911 or calling your MD immediately  if symptoms less severe.  You Must read complete instructions/literature along with all the possible adverse reactions/side effects for all the Medicines you take and that have been prescribed to you. Take any new Medicines after you have completely understood and accpet all the possible adverse reactions/side effects.   Please note  You were cared for by a hospitalist during your hospital stay. If you have any questions about your discharge medications or the care you received while you were in the hospital after you are discharged, you can call the unit and asked to speak with the hospitalist on call if the hospitalist that took care of you is not available. Once you are discharged, your primary care physician will handle any further medical issues. Please note that NO REFILLS for any discharge medications will be authorized once you are discharged, as it is imperative that you return to your primary care physician (or establish a relationship with a  primary care physician if you do not have one) for your aftercare needs so that they can reassess your need for medications and monitor your lab values.    On the day of Discharge:  VITAL SIGNS:  Blood pressure 113/65, pulse 83, temperature 98.4 F (36.9 C), temperature source Oral, resp. rate 17, height 5\' 9"  (1.753 m), weight 91.9 kg, SpO2 99 %. PHYSICAL EXAMINATION:  GENERAL:  72 y.o.-year-old patient lying in the bed with no acute distress.  EYES: Pupils equal, round, reactive to light and accommodation. No scleral icterus. Extraocular muscles intact.  HEENT: Head atraumatic, normocephalic. Oropharynx and nasopharynx clear.  NECK:  Supple, no jugular venous distention. No thyroid enlargement, no tenderness.  LUNGS: Normal breath sounds bilaterally, no wheezing, rales,rhonchi or crepitation. No use of accessory muscles of respiration.  CARDIOVASCULAR: S1, S2 normal. No murmurs, rubs, or gallops.  ABDOMEN: Soft, non-tender, non-distended. Bowel sounds present. No organomegaly or mass.  EXTREMITIES: No pedal edema, cyanosis, or clubbing.  NEUROLOGIC: Cranial nerves II through XII are intact. Muscle strength 5/5 in all extremities. Sensation intact. Gait not checked.  PSYCHIATRIC: The patient is alert and oriented x 3.  SKIN: No obvious rash, lesion, or ulcer.  DATA REVIEW:   CBC Recent Labs  Lab 11/03/18 0333  WBC 9.4  HGB 12.7  HCT 41.1  PLT 307    Chemistries  Recent Labs  Lab 11/01/18 0436  11/03/18 0333  NA 139   < > 139  K 3.5   < > 3.9  CL 95*   < > 95*  CO2 35*   < > 35*  GLUCOSE 195*   < > 187*  BUN 21   < > 26*  CREATININE 0.74   < > 0.64  CALCIUM 8.6*   < > 9.2  MG 2.2  --   --    < > = values in this interval not displayed.     Microbiology Results  Results for orders placed or performed during the hospital encounter of 10/26/18  Blood Culture (routine x 2)     Status: Abnormal   Collection Time: 10/26/18  9:54 PM  Result Value Ref Range Status    Specimen Description   Final    BLOOD BLOOD RIGHT WRIST Performed at [facility], [address] [place], [place] [address]    Special Requests   Final    BOTTLES DRAWN AEROBIC AND ANAEROBIC  Blood Culture adequate volume Performed at [facility], [address] [place], [place] [address]    Culture  Setup Time   Final    GRAM NEGATIVE RODS IN BOTH AEROBIC AND ANAEROBIC BOTTLES CRITICAL VALUE NOTED.  VALUE IS CONSISTENT WITH PREVIOUSLY REPORTED AND CALLED VALUE.    Culture (A)  Final    KLEBSIELLA PNEUMONIAE SUSCEPTIBILITIES PERFORMED ON PREVIOUS CULTURE WITHIN THE LAST 5 DAYS. Performed at [facility], [place] [address], [place], [place] [address]    Report Status 10/29/2018 FINAL  Final  Blood Culture (routine x 2)     Status: Abnormal   Collection Time: 10/26/18  9:54 PM  Result Value Ref Range Status   Specimen Description   Final    BLOOD BLOOD RIGHT HAND Performed at [facility], [address] [place], [place] [address]    Special Requests   Final    BOTTLES DRAWN AEROBIC AND ANAEROBIC Blood Culture results may not be optimal due to an excessive volume of blood received in culture bottles Performed at [facility], [address], [place], [place] [address]    Culture  Setup Time   Final    GRAM NEGATIVE RODS IN BOTH AEROBIC AND ANAEROBIC BOTTLES CRITICAL RESULT CALLED TO, READ BACK BY AND VERIFIED WITH: HANK ZOMPA AT 6213 ON 10/27/18 [place]. Performed at [facility], [place] [address] [place], [place] [address]    Culture KLEBSIELLA PNEUMONIAE (A)  Final   Report Status 10/29/2018 FINAL  Final   Organism ID, Bacteria KLEBSIELLA PNEUMONIAE  Final      Susceptibility   Klebsiella pneumoniae - MIC*    AMPICILLIN RESISTANT Resistant     CEFAZOLIN <=4 SENSITIVE Sensitive     CEFEPIME <=1 SENSITIVE Sensitive     CEFTAZIDIME <=1 SENSITIVE Sensitive     CEFTRIAXONE <=1 SENSITIVE Sensitive     CIPROFLOXACIN <=0.25  SENSITIVE Sensitive     GENTAMICIN <=1 SENSITIVE Sensitive     IMIPENEM <=0.25 SENSITIVE Sensitive     TRIMETH/SULFA <=20 SENSITIVE Sensitive     AMPICILLIN/SULBACTAM 4 SENSITIVE Sensitive     PIP/TAZO <=4 SENSITIVE Sensitive     Extended ESBL NEGATIVE Sensitive     * KLEBSIELLA PNEUMONIAE  Blood Culture ID Panel (Reflexed)     Status: Abnormal   Collection Time: 10/26/18  9:54 PM  Result Value Ref Range Status   Enterococcus species NOT DETECTED NOT DETECTED Final   Listeria monocytogenes NOT DETECTED NOT DETECTED Final   Staphylococcus species NOT DETECTED NOT DETECTED Final   Staphylococcus aureus (BCID) NOT DETECTED NOT DETECTED Final   Streptococcus species NOT DETECTED NOT DETECTED Final   Streptococcus agalactiae NOT DETECTED NOT DETECTED Final   Streptococcus pneumoniae NOT DETECTED NOT DETECTED Final   Streptococcus pyogenes NOT DETECTED NOT DETECTED Final   Acinetobacter baumannii NOT DETECTED NOT DETECTED Final   Enterobacteriaceae species DETECTED (A) NOT DETECTED Final    Comment: Enterobacteriaceae represent a large family of gram-negative bacteria, not a single organism. CRITICAL RESULT CALLED TO, READ BACK BY AND VERIFIED WITH: HANK ZOMPA AT 8469 ON 10/27/18 [place].    Enterobacter cloacae complex NOT DETECTED NOT DETECTED Final   Escherichia coli NOT DETECTED NOT DETECTED Final   Klebsiella oxytoca NOT DETECTED NOT DETECTED Final   Klebsiella pneumoniae DETECTED (A) NOT DETECTED Final    Comment: HANK ZOMPA AT 6295 ON 10/27/18 [place].   Proteus species NOT DETECTED NOT DETECTED Final   Serratia marcescens NOT DETECTED NOT  DETECTED Final   Carbapenem resistance NOT DETECTED NOT DETECTED Final   Haemophilus influenzae NOT DETECTED NOT DETECTED Final   Neisseria meningitidis NOT DETECTED NOT DETECTED Final   Pseudomonas aeruginosa NOT DETECTED NOT DETECTED Final   Candida albicans NOT DETECTED NOT DETECTED Final   Candida glabrata NOT DETECTED NOT DETECTED Final   Candida  krusei NOT DETECTED NOT DETECTED Final   Candida parapsilosis NOT DETECTED NOT DETECTED Final   Candida tropicalis NOT DETECTED NOT DETECTED Final    Comment: Performed at [facility], [address] [place], [place] [address]  MRSA PCR Screening     Status: None   Collection Time: 10/27/18 10:40 AM  Result Value Ref Range Status   MRSA by PCR NEGATIVE NEGATIVE Final    Comment:        The GeneXpert MRSA Assay (FDA approved for NASAL specimens only), is one component of a comprehensive MRSA colonization surveillance program. It is not intended to diagnose MRSA infection nor to guide or monitor treatment for MRSA infections. Performed at [facility], [address] [place], [place] [address]   CULTURE, BLOOD (ROUTINE X 2) w Reflex to ID Panel     Status: None   Collection Time: 10/31/18  1:03 PM  Result Value Ref Range Status   Specimen Description BLOOD RAC  Final   Special Requests   Final    BOTTLES DRAWN AEROBIC AND ANAEROBIC Blood Culture results may not be optimal due to an excessive volume of blood received in culture bottles   Culture   Final    NO GROWTH 5 DAYS Performed at [facility], [address] [place], [place] [address]    Report Status 11/05/2018 FINAL  Final  CULTURE, BLOOD (ROUTINE X 2) w Reflex to ID Panel     Status: None   Collection Time: 10/31/18  1:03 PM  Result Value Ref Range Status   Specimen Description BLOOD R HAND  Final   Special Requests   Final    BOTTLES DRAWN AEROBIC AND ANAEROBIC Blood Culture adequate volume   Culture   Final    NO GROWTH 5 DAYS Performed at [facility], [address], [place], [place] [address]    Report Status 11/05/2018 FINAL  Final    RADIOLOGY:  No results found.   Management plans discussed with the patient, family and they are in agreement.  CODE STATUS: Prior   TOTAL TIME TAKING CARE OF THIS PATIENT: 45 minutes.    Max Sane M.D on 11/07/2018 at 4:38  PM  Between 7am to 6pm - Pager - [phone]  After 6pm go to [email or web address] - Proofreader  [facility]  Office  [phone]  CC: Primary care physician; Dion Body, MD   Note: This dictation was prepared with Dragon dictation along with smaller phrase technology. Any transcriptional errors that result from this process are unintentional.

## 2018-11-09 ENCOUNTER — Telehealth: Payer: Self-pay

## 2018-11-09 NOTE — Telephone Encounter (Signed)
EMMI flagged for sad, hopeless feelings and loss of interest in things. CSW contacted patient regarding these concerns. CSW introduced self and explained reason for the call. Patient states that she has not received any automated calls and does not remember saying or agreeing to anything that would have these concerns. Patient reports that she is doing very well and has no sad or hopeless feelings at all. Patient reports no SI/HI. Patient states that she has no other concerns at this time.   Ferdinand, Vincent

## 2018-11-16 ENCOUNTER — Telehealth: Payer: Self-pay | Admitting: Obstetrics and Gynecology

## 2018-11-16 NOTE — Telephone Encounter (Signed)
Pt lvm to cancel apt scheduled at our office, After looking through the pt's apt desk patient never has an apt here and also has never been seen at [facility] from looking at Pt's chart. Thank you.

## 2018-11-20 ENCOUNTER — Other Ambulatory Visit: Payer: Self-pay

## 2018-11-20 NOTE — Patient Outreach (Signed)
[facility]) Care Management  11/20/2018  Betty [name] [birth year] [id number]   Medication Adherence call to [name] spoke with patient she ask if we can call [facility] an order Metformin 500 mg .Pharmacy said they do pill pack every month she has medication for the month she has been in and out the hospital . Mrs. Wake is showing past due under [place].   [facility] Direct Dial [phone]  Fax [phone] [email or web address]

## 2018-12-21 ENCOUNTER — Encounter (INDEPENDENT_AMBULATORY_CARE_PROVIDER_SITE_OTHER): Payer: Medicare Other

## 2018-12-21 ENCOUNTER — Ambulatory Visit (INDEPENDENT_AMBULATORY_CARE_PROVIDER_SITE_OTHER): Payer: Medicare Other | Admitting: Vascular Surgery

## 2018-12-25 ENCOUNTER — Inpatient Hospital Stay
Admission: EM | Admit: 2018-12-25 | Discharge: 2018-12-29 | DRG: 871 | Disposition: A | Discharge status: Home Health | Payer: Medicare Other | Attending: Internal Medicine | Admitting: Internal Medicine

## 2018-12-25 ENCOUNTER — Emergency Department: Payer: Medicare Other

## 2018-12-25 ENCOUNTER — Encounter: Payer: Self-pay | Admitting: Emergency Medicine

## 2018-12-25 ENCOUNTER — Other Ambulatory Visit: Payer: Self-pay

## 2018-12-25 DIAGNOSIS — I5032 Chronic diastolic (congestive) heart failure: Secondary | ICD-10-CM | POA: Diagnosis present

## 2018-12-25 DIAGNOSIS — N390 Urinary tract infection, site not specified: Secondary | ICD-10-CM | POA: Diagnosis present

## 2018-12-25 DIAGNOSIS — E872 Acidosis: Secondary | ICD-10-CM | POA: Diagnosis present

## 2018-12-25 DIAGNOSIS — Z86718 Personal history of other venous thrombosis and embolism: Secondary | ICD-10-CM

## 2018-12-25 DIAGNOSIS — G2581 Restless legs syndrome: Secondary | ICD-10-CM | POA: Diagnosis present

## 2018-12-25 DIAGNOSIS — Z9981 Dependence on supplemental oxygen: Secondary | ICD-10-CM

## 2018-12-25 DIAGNOSIS — G4733 Obstructive sleep apnea (adult) (pediatric): Secondary | ICD-10-CM | POA: Diagnosis present

## 2018-12-25 DIAGNOSIS — Z952 Presence of prosthetic heart valve: Secondary | ICD-10-CM | POA: Diagnosis not present

## 2018-12-25 DIAGNOSIS — Z79899 Other long term (current) drug therapy: Secondary | ICD-10-CM | POA: Diagnosis not present

## 2018-12-25 DIAGNOSIS — Z7984 Long term (current) use of oral hypoglycemic drugs: Secondary | ICD-10-CM

## 2018-12-25 DIAGNOSIS — E1151 Type 2 diabetes mellitus with diabetic peripheral angiopathy without gangrene: Secondary | ICD-10-CM | POA: Diagnosis present

## 2018-12-25 DIAGNOSIS — I480 Paroxysmal atrial fibrillation: Secondary | ICD-10-CM | POA: Diagnosis present

## 2018-12-25 DIAGNOSIS — R6521 Severe sepsis with septic shock: Secondary | ICD-10-CM | POA: Diagnosis present

## 2018-12-25 DIAGNOSIS — Z87440 Personal history of urinary (tract) infections: Secondary | ICD-10-CM | POA: Diagnosis not present

## 2018-12-25 DIAGNOSIS — Y95 Nosocomial condition: Secondary | ICD-10-CM | POA: Diagnosis present

## 2018-12-25 DIAGNOSIS — R918 Other nonspecific abnormal finding of lung field: Secondary | ICD-10-CM

## 2018-12-25 DIAGNOSIS — E669 Obesity, unspecified: Secondary | ICD-10-CM | POA: Diagnosis present

## 2018-12-25 DIAGNOSIS — Z86711 Personal history of pulmonary embolism: Secondary | ICD-10-CM

## 2018-12-25 DIAGNOSIS — J189 Pneumonia, unspecified organism: Secondary | ICD-10-CM | POA: Diagnosis present

## 2018-12-25 DIAGNOSIS — Z90710 Acquired absence of both cervix and uterus: Secondary | ICD-10-CM

## 2018-12-25 DIAGNOSIS — R6520 Severe sepsis without septic shock: Secondary | ICD-10-CM | POA: Diagnosis present

## 2018-12-25 DIAGNOSIS — J9621 Acute and chronic respiratory failure with hypoxia: Secondary | ICD-10-CM | POA: Diagnosis present

## 2018-12-25 DIAGNOSIS — E1165 Type 2 diabetes mellitus with hyperglycemia: Secondary | ICD-10-CM | POA: Diagnosis present

## 2018-12-25 DIAGNOSIS — I110 Hypertensive heart disease with heart failure: Secondary | ICD-10-CM | POA: Diagnosis present

## 2018-12-25 DIAGNOSIS — Z7901 Long term (current) use of anticoagulants: Secondary | ICD-10-CM

## 2018-12-25 DIAGNOSIS — Z8249 Family history of ischemic heart disease and other diseases of the circulatory system: Secondary | ICD-10-CM | POA: Diagnosis not present

## 2018-12-25 DIAGNOSIS — J9601 Acute respiratory failure with hypoxia: Secondary | ICD-10-CM | POA: Diagnosis not present

## 2018-12-25 DIAGNOSIS — Z9049 Acquired absence of other specified parts of digestive tract: Secondary | ICD-10-CM

## 2018-12-25 DIAGNOSIS — A419 Sepsis, unspecified organism: Secondary | ICD-10-CM | POA: Diagnosis present

## 2018-12-25 DIAGNOSIS — Z87891 Personal history of nicotine dependence: Secondary | ICD-10-CM

## 2018-12-25 DIAGNOSIS — R5383 Other fatigue: Secondary | ICD-10-CM | POA: Diagnosis present

## 2018-12-25 DIAGNOSIS — I959 Hypotension, unspecified: Secondary | ICD-10-CM

## 2018-12-25 DIAGNOSIS — E860 Dehydration: Secondary | ICD-10-CM | POA: Diagnosis present

## 2018-12-25 DIAGNOSIS — J440 Chronic obstructive pulmonary disease with acute lower respiratory infection: Secondary | ICD-10-CM | POA: Diagnosis present

## 2018-12-25 DIAGNOSIS — E876 Hypokalemia: Secondary | ICD-10-CM | POA: Diagnosis present

## 2018-12-25 LAB — BASIC METABOLIC PANEL
Anion gap: 12 (ref 5–15)
BUN: 36 mg/dL — ABNORMAL HIGH (ref 8–23)
CHLORIDE: 92 mmol/L — AB (ref 98–111)
CO2: 30 mmol/L (ref 22–32)
CREATININE: 1.06 mg/dL — AB (ref 0.44–1.00)
Calcium: 8.8 mg/dL — ABNORMAL LOW (ref 8.9–10.3)
GFR calc Af Amer: >60 mL/min (ref >60)
GFR, EST NON AFRICAN AMERICAN: 52 mL/min — AB (ref >60)
Glucose, Bld: 212 mg/dL — ABNORMAL HIGH (ref 70–99)
Potassium: 3.9 mmol/L (ref 3.5–5.1)
Sodium: 134 mmol/L — ABNORMAL LOW (ref 135–145)

## 2018-12-25 LAB — GLUCOSE, CAPILLARY: Glucose-Capillary: 145 mg/dL — ABNORMAL HIGH (ref 70–99)

## 2018-12-25 LAB — CBC
HEMATOCRIT: 42.3 % (ref 36.0–46.0)
Hemoglobin: 13.6 g/dL (ref 12.0–15.0)
MCH: 29.9 pg (ref 26.0–34.0)
MCHC: 32.2 g/dL (ref 30.0–36.0)
MCV: 93 fL (ref 80.0–100.0)
Platelets: 220 10*3/uL (ref 150–400)
RBC: 4.55 MIL/uL (ref 3.87–5.11)
RDW: 16.3 % — AB (ref 11.5–15.5)
WBC: 21.4 10*3/uL — AB (ref 4.0–10.5)
nRBC: 0 % (ref 0.0–0.2)

## 2018-12-25 LAB — INFLUENZA PANEL BY PCR (TYPE A & B)
Influenza A By PCR: NEGATIVE
Influenza B By PCR: NEGATIVE

## 2018-12-25 LAB — PROTIME-INR
INR: 2.91
Prothrombin Time: 30 seconds — ABNORMAL HIGH (ref 11.4–15.2)

## 2018-12-25 LAB — URINALYSIS, COMPLETE (UACMP) WITH MICROSCOPIC
Bilirubin Urine: NEGATIVE
Glucose, UA: NEGATIVE mg/dL
Ketones, ur: NEGATIVE mg/dL
NITRITE: NEGATIVE
Protein, ur: 30 mg/dL — AB
SPECIFIC GRAVITY, URINE: 1.01 (ref 1.005–1.030)
WBC, UA: >50 WBC/hpf — ABNORMAL HIGH (ref 0–5)
pH: 6 (ref 5.0–8.0)

## 2018-12-25 LAB — HEMOGLOBIN A1C
Hgb A1c MFr Bld: 6.7 % — ABNORMAL HIGH (ref 4.8–5.6)
MEAN PLASMA GLUCOSE: 145.59 mg/dL

## 2018-12-25 LAB — LIPASE, BLOOD: Lipase: 25 U/L (ref 11–51)

## 2018-12-25 LAB — TROPONIN I: Troponin I: 0.09 ng/mL (ref <0.03)

## 2018-12-25 LAB — LACTIC ACID, PLASMA: Lactic Acid, Venous: 3.2 mmol/L (ref 0.5–1.9)

## 2018-12-25 MED ORDER — VANCOMYCIN HCL IN DEXTROSE 1-5 GM/200ML-% IV SOLN
1000.0000 mg | Freq: Once | INTRAVENOUS | Status: AC
  Administered 2018-12-25: 1000 mg via INTRAVENOUS
  Filled 2018-12-25: qty 200

## 2018-12-25 MED ORDER — ALBUTEROL SULFATE (2.5 MG/3ML) 0.083% IN NEBU
2.5000 mg | INHALATION_SOLUTION | RESPIRATORY_TRACT | Status: DC | PRN
  Administered 2018-12-28: 2.5 mg via RESPIRATORY_TRACT
  Filled 2018-12-25: qty 3

## 2018-12-25 MED ORDER — POTASSIUM CHLORIDE CRYS ER 10 MEQ PO TBCR
10.0000 meq | EXTENDED_RELEASE_TABLET | Freq: Every day | ORAL | Status: DC
  Administered 2018-12-26 – 2018-12-29 (×4): 10 meq via ORAL
  Filled 2018-12-25 (×4): qty 1

## 2018-12-25 MED ORDER — SODIUM CHLORIDE 0.9 % IV SOLN
1.0000 g | Freq: Once | INTRAVENOUS | Status: AC
  Administered 2018-12-25: 1 g via INTRAVENOUS
  Filled 2018-12-25: qty 1

## 2018-12-25 MED ORDER — WARFARIN SODIUM 4 MG PO TABS
8.0000 mg | ORAL_TABLET | ORAL | Status: DC
  Administered 2018-12-28: 8 mg via ORAL
  Filled 2018-12-25 (×2): qty 2

## 2018-12-25 MED ORDER — WARFARIN SODIUM 3 MG PO TABS
3.0000 mg | ORAL_TABLET | ORAL | Status: DC
Start: 2018-12-26 — End: 2018-12-29
  Administered 2018-12-26 – 2018-12-27 (×2): 3 mg via ORAL
  Filled 2018-12-25 (×3): qty 1

## 2018-12-25 MED ORDER — SODIUM CHLORIDE 0.9 % IV SOLN
Freq: Once | INTRAVENOUS | Status: AC
  Administered 2018-12-25: 23:00:00 via INTRAVENOUS

## 2018-12-25 MED ORDER — SODIUM CHLORIDE 0.9% FLUSH
3.0000 mL | Freq: Two times a day (BID) | INTRAVENOUS | Status: DC
  Administered 2018-12-26 – 2018-12-29 (×7): 3 mL via INTRAVENOUS

## 2018-12-25 MED ORDER — ACETAMINOPHEN 650 MG RE SUPP: 650.0000 mg | Freq: Four times a day (QID) | RECTAL | Status: DC | PRN

## 2018-12-25 MED ORDER — ACETAMINOPHEN 325 MG PO TABS
650.0000 mg | ORAL_TABLET | Freq: Four times a day (QID) | ORAL | Status: DC | PRN
  Administered 2018-12-26 (×4): 650 mg via ORAL
  Filled 2018-12-25 (×4): qty 2

## 2018-12-25 MED ORDER — VANCOMYCIN HCL 10 G IV SOLR
1250.0000 mg | INTRAVENOUS | Status: DC
  Administered 2018-12-26 (×2): 1250 mg via INTRAVENOUS
  Filled 2018-12-25 (×3): qty 1250

## 2018-12-25 MED ORDER — WARFARIN - PHARMACIST DOSING INPATIENT
Freq: Every day | Status: DC
  Administered 2018-12-26 – 2018-12-27 (×2)
  Filled 2018-12-25: qty 1

## 2018-12-25 MED ORDER — ACETAMINOPHEN 500 MG PO TABS
1000.0000 mg | ORAL_TABLET | Freq: Once | ORAL | Status: AC
  Administered 2018-12-25: 1000 mg via ORAL
  Filled 2018-12-25: qty 2

## 2018-12-25 MED ORDER — PANTOPRAZOLE SODIUM 40 MG PO TBEC
40.0000 mg | DELAYED_RELEASE_TABLET | Freq: Every day | ORAL | Status: DC
  Administered 2018-12-26 – 2018-12-29 (×4): 40 mg via ORAL
  Filled 2018-12-25 (×4): qty 1

## 2018-12-25 MED ORDER — IPRATROPIUM-ALBUTEROL 0.5-2.5 (3) MG/3ML IN SOLN
3.0000 mL | RESPIRATORY_TRACT | Status: DC
  Administered 2018-12-25 – 2018-12-27 (×9): 3 mL via RESPIRATORY_TRACT
  Filled 2018-12-25 (×9): qty 3

## 2018-12-25 MED ORDER — SODIUM CHLORIDE 0.9 % IV BOLUS
500.0000 mL | Freq: Once | INTRAVENOUS | Status: AC
  Administered 2018-12-25: 500 mL via INTRAVENOUS

## 2018-12-25 MED ORDER — ROPINIROLE HCL 1 MG PO TABS
1.0000 mg | ORAL_TABLET | Freq: Every day | ORAL | Status: DC
Start: 2018-12-26 — End: 2018-12-29
  Administered 2018-12-26 – 2018-12-28 (×3): 1 mg via ORAL
  Filled 2018-12-25 (×3): qty 1

## 2018-12-25 MED ORDER — ORAL CARE MOUTH RINSE
15.0000 mL | Freq: Two times a day (BID) | OROMUCOSAL | Status: DC
  Administered 2018-12-25 – 2018-12-28 (×6): 15 mL via OROMUCOSAL

## 2018-12-25 MED ORDER — ROPINIROLE HCL 1 MG PO TABS
1.0000 mg | ORAL_TABLET | Freq: Every day | ORAL | Status: DC
  Administered 2018-12-26 – 2018-12-27 (×2): 1 mg via ORAL
  Filled 2018-12-25 (×5): qty 1

## 2018-12-25 MED ORDER — SODIUM CHLORIDE 0.9 % IV SOLN
INTRAVENOUS | Status: DC
Start: 2018-12-25 — End: 2018-12-26
  Administered 2018-12-25: 500 mL via INTRAVENOUS
  Administered 2018-12-26 (×2): via INTRAVENOUS

## 2018-12-25 MED ORDER — POLYETHYLENE GLYCOL 3350 17 G PO PACK: 17.0000 g | PACK | Freq: Every day | ORAL | Status: DC | PRN

## 2018-12-25 MED ORDER — INSULIN ASPART 100 UNIT/ML ~~LOC~~ SOLN
0.0000 [IU] | Freq: Three times a day (TID) | SUBCUTANEOUS | Status: DC
  Administered 2018-12-26: 1 [IU] via SUBCUTANEOUS
  Administered 2018-12-26: 2 [IU] via SUBCUTANEOUS
  Administered 2018-12-26: 3 [IU] via SUBCUTANEOUS
  Administered 2018-12-27: 2 [IU] via SUBCUTANEOUS
  Administered 2018-12-27: 3 [IU] via SUBCUTANEOUS
  Administered 2018-12-27: 5 [IU] via SUBCUTANEOUS
  Administered 2018-12-28: 2 [IU] via SUBCUTANEOUS
  Administered 2018-12-28 – 2018-12-29 (×4): 1 [IU] via SUBCUTANEOUS
  Filled 2018-12-25 (×11): qty 1

## 2018-12-25 NOTE — ED Notes (Signed)
ED Provider at bedside. 

## 2018-12-25 NOTE — H&P (Signed)
Betty [name] at [place] NAME: Betty [name]    MR#:  [id number]  DATE OF BIRTH:  08/10/1946  DATE OF ADMISSION:  12/25/2018  PRIMARY CARE PHYSICIAN: Dion Body, MD   REQUESTING/REFERRING PHYSICIAN: Dr. Jacqualine Code  CHIEF COMPLAINT:   Chief Complaint  Patient presents with  . Fatigue    HISTORY OF PRESENT ILLNESS:  Betty [name]  is a 73 y.o. female with a known history of mechanical valve on Coumadin, diastolic CHF, recurrent infections, chronic respiratory failure, recent admission for Klebsiella UTI and bacteremia returns to the emergency room due to weakness, confusion and shortness of breath since 1 day.  Patient here has been found to be hypotensive, chest x-ray showing right-sided pneumonia along with concern for some pulmonary congestion.  Patient is able to give history but falls back to sleep easily.  Son is at bedside.  Old records reviewed. No change in medications recently. She has received 1-1/2 L normal saline and a second normal saline bag is running at this time.  Blood pressure continues to be low at 89/56.  PAST MEDICAL HISTORY:   Past Medical History:  Diagnosis Date  . Acute diastolic heart failure ([place])   . Allergy   . 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 Asthma   . Atrial fibrillation ([place])   . Backache, unspecified   . Cancer ([place])    skin  . Cardiomegaly   . COPD (chronic obstructive pulmonary disease) ([place])   . Diabetes mellitus without complication ([place])   . 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 Esophageal reflux   . Headache(784.0)   . Herpes zoster without mention of complication   . Hx: UTI (urinary tract infection)   . Hypertension    heart controlled w CHF  . Nontoxic uninodular goiter   . Obesity, unspecified   . Osteoarthrosis, unspecified whether generalized or localized, unspecified site   . Unspecified sleep apnea   . Urine incontinence    hx of    PAST SURGICAL  HISTORY:   Past Surgical History:  Procedure Laterality Date  . ABDOMINAL HYSTERECTOMY  1979   complete (for precancerous cells)  . ABLATION  2011 & 2014  . bladder botox  01/26/2018  . CHOLECYSTECTOMY    . CYSTOSCOPY WITH FULGERATION N/A 01/28/2018   Procedure: [place] AND CLOT EVACUATION;  Surgeon: Hollice Espy, MD;  Location: [facility] ORS;  Service: Urology;  Laterality: N/A;  . Interstim Placement  2012  . OOPHORECTOMY      SOCIAL HISTORY:   Social History   Tobacco Use  . Smoking status: Former Smoker    Packs/day: 0.50    Years: 15.00    Pack years: 7.50    Types: Cigarettes  . Smokeless tobacco: Never Used  . Tobacco comment: Has a 20-pack-year history, qutting in 1970.   Substance Use Topics  . Alcohol use: No    Alcohol/week: 0.0 standard drinks    FAMILY HISTORY:   Family History  Problem Relation Age of Onset  . Heart attack Father   . Heart failure Father   . Arthritis Father   . Stroke Father   . Hypertension Father   . Coronary artery disease Brother   . Peripheral vascular disease Brother   . Arthritis Mother   . Colon cancer Mother        colon cancer  . Hypertension Mother   . Cancer Maternal Grandmother  colon cancer  . Arthritis Maternal Grandmother     DRUG ALLERGIES:   Allergies  Allergen Reactions  . Flecainide Shortness Of Breath and Other (See Comments)    Reaction: dizziness   . Amiodarone Other (See Comments)    Pt states that this medication causes lung bleeding.      REVIEW OF SYSTEMS:   Review of Systems  Constitutional: Positive for malaise/fatigue. Negative for chills and fever.  HENT: Negative for sore throat.   Eyes: Negative for blurred vision, double vision and pain.  Respiratory: Positive for cough and shortness of breath. Negative for hemoptysis and wheezing.   Cardiovascular: Negative for chest pain, palpitations, orthopnea and leg swelling.  Gastrointestinal: Negative for abdominal  pain, constipation, diarrhea, heartburn, nausea and vomiting.  Genitourinary: Negative for dysuria and hematuria.  Musculoskeletal: Negative for back pain and joint pain.  Skin: Negative for rash.  Neurological: Negative for sensory change, speech change, focal weakness and headaches.  Endo/Heme/Allergies: Does not bruise/bleed easily.  Psychiatric/Behavioral: Negative for depression. The patient is not nervous/anxious.    MEDICATIONS AT HOME:   Prior to Admission medications   Medication Sig Start Date End Date Taking? Authorizing Provider  acetaminophen (TYLENOL) 325 MG tablet Take 2 tablets (650 mg total) by mouth every 6 (six) hours as needed for mild pain (or Fever >/= 101). 02/04/18  Yes Gladstone Lighter, MD  ferrous sulfate 325 (65 FE) MG EC tablet Take 325 mg by mouth daily.  06/23/17  Yes [provider]  gabapentin (NEURONTIN) 600 MG tablet Take 300-600 mg by mouth 3 (three) times daily. Take 2 capsules (600mg ) in morning, 1 capsule (300mg ) at midday, and 2 capsules (600mg ) at bedtime   Yes [provider]  levocetirizine (XYZAL) 5 MG tablet Take 5 mg by mouth at bedtime. 10/15/18  Yes [provider]  metFORMIN (GLUCOPHAGE) 1000 MG tablet Take 500 mg by mouth 2 (two) times daily with a meal.    Yes [provider]  metoprolol tartrate (LOPRESSOR) 25 MG tablet Take 25 mg by mouth 2 (two) times daily. 10/21/18  Yes [provider]  pantoprazole (PROTONIX) 40 MG tablet Take 40 mg by mouth daily. 10/15/18  Yes [provider]  potassium chloride (MICRO-K) 10 MEQ CR capsule Take 20 mEq by mouth 2 (two) times daily. 10/26/18  Yes [provider]  ranitidine (ZANTAC) 150 MG tablet Take 150 mg by mouth 2 (two) times daily.   Yes [provider]  rOPINIRole (REQUIP) 1 MG tablet Take 1 mg by mouth at bedtime. 09/18/18  Yes [provider]  spironolactone (ALDACTONE) 25 MG tablet Take 12.5 mg by mouth daily. 10/06/18   Yes [provider]  torsemide (DEMADEX) 20 MG tablet Take 80 mg by mouth 2 (two) times daily.   Yes [provider]  traZODone (DESYREL) 50 MG tablet Take 50 mg by mouth at bedtime.  10/26/18  Yes [provider]  venlafaxine XR (EFFEXOR-XR) 150 MG 24 hr capsule Take 150 mg by mouth daily. 10/02/18  Yes [provider]  Vitamin D, Ergocalciferol, (DRISDOL) 50000 units CAPS capsule Take 50,000 Units by mouth once a week. 10/21/18  Yes [provider]  warfarin (COUMADIN) 3 MG tablet Take 3 mg by mouth daily. On Saturday and Sunday   Yes [provider]  warfarin (COUMADIN) 4 MG tablet Take 8 mg by mouth daily. Monday - Friday   Yes [provider]  metolazone (ZAROXOLYN) 2.5 MG tablet Take 2.5 mg by  mouth 2 (two) times a week. Take 2.5 mg by mouth on Monday and Thursday.    [provider]     VITAL SIGNS:  Blood pressure (!) 89/56, pulse 80, temperature (!) 100.4 F (38 C), temperature source Oral, resp. rate (!) 23, height 5\' 9"  (1.753 m), weight 90.7 kg, SpO2 92 %.  PHYSICAL EXAMINATION:  Physical Exam  GENERAL:  73 y.o.-year-old patient lying in the bed with no acute distress.  EYES: Pupils equal, round, reactive to light and accommodation. No scleral icterus. Extraocular muscles intact. HEENT: Head atraumatic, normocephalic. Oropharynx and nasopharynx clear. No oropharyngeal erythema, moist oral mucosa  NECK:  Supple, no jugular venous distention. No thyroid enlargement, no tenderness.  LUNGS: Bibasilar crackles CARDIOVASCULAR: S1, S2 normal. No murmurs, rubs, or gallops.  ABDOMEN: Soft, nontender, nondistended. Bowel sounds present. No organomegaly or mass.  EXTREMITIES: No pedal edema, cyanosis, or clubbing. + 2 pedal & radial pulses b/l.   NEUROLOGIC: Cranial nerves II through XII are intact. No focal Motor or sensory deficits appreciated b/l. PSYCHIATRIC: The patient is alert and oriented x 3.  Drowsy SKIN: No  obvious rash, lesion, or ulcer.   LABORATORY PANEL:   CBC Recent Labs  Lab 12/25/18 1850  WBC 21.4*  HGB 13.6  HCT 42.3  PLT 220   ------------------------------------------------------------------------------------------------------------------  Chemistries  Recent Labs  Lab 12/25/18 1850  NA 134*  K 3.9  CL 92*  CO2 30  GLUCOSE 212*  BUN 36*  CREATININE 1.06*  CALCIUM 8.8*   ------------------------------------------------------------------------------------------------------------------  Cardiac Enzymes Recent Labs  Lab 12/25/18 1850  TROPONINI 0.09*   ------------------------------------------------------------------------------------------------------------------  RADIOLOGY:  Dg Chest 2 View  Result Date: 12/25/2018 CLINICAL DATA:  Fever. Confusion and lethargy. EXAM: CHEST - 2 VIEW COMPARISON:  Radiographs 10/26/2018 FINDINGS: Post median sternotomy prosthetic aortic valve. 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 Mild cardiomegaly with unchanged mediastinal contours and aortic atherosclerosis. Small right and possibly left pleural effusions, not significantly changed from prior exam. Interstitial opacities with slight progression from prior. More focal opacity in the periphery of the right upper lobe is new. No pneumothorax. No acute osseous abnormalities are seen. IMPRESSION: 1. Focal opacity in the periphery of the right upper lobe suspicious for pneumonia in the setting of fever. Pleural fluid layering along the fissures also considered but felt less likely. 2. Interstitial opacities with slight progression from prior, may be pulmonary edema or atypical infection. 3. Small right and possibly left pleural effusions, not significantly changed. Electronically Signed   By: Keith Rake M.D.   On: 12/25/2018 19:38   IMPRESSION AND PLAN:   *Healthcare acquired pneumonia and severe sepsis with chronic respiratory failure We will continue fluid resuscitation at this time.   May need pressor support.  Admit to stepdown unit.  Discussed with intensivist over [facility].  Nebulizers as needed added.  Blood culture sent and pending.  Will order sputum cultures.  *COPD with chronic respiratory failure.  Continue 2 to 3 L oxygen.  *Diabetes mellitus.  Sliding scale insulin added.  *Paroxysmal atrial fibrillation.  Continue Coumadin.  Metoprolol held due to hypotension.  *Mechanical valve with Coumadin.  Pharmacy consulted.  DVT prophylaxis.  On Coumadin.  All the records are reviewed and case discussed with ED provider. Management plans discussed with the patient, family and they are in agreement.  CODE STATUS: Full code  [name] with high risk for deterioration and death.  Discussed the same with patient and son at bedside.  TOTAL CRITICAL CARE TIME TAKING  CARE OF THIS PATIENT:80 minutes.   Leia Alf Fabrizio Filip M.D on 12/25/2018 at 9:16 PM  Between 7am to 6pm - Pager - [phone]  After 6pm go to [email or web address] - password [email or web address] [facility]  Office  [phone]  CC: Primary care physician; Dion Body, MD  Note: This dictation was prepared with Dragon dictation along with smaller phrase technology. Any transcriptional errors that result from this process are unintentional.

## 2018-12-25 NOTE — Progress Notes (Signed)
Pharmacy Antibiotic Note  Betty [name] is a 73 y.o. female admitted on 12/25/2018 with HCAP, sepsis .  Pharmacy has been consulted for Vancomycin dosing.  Plan: Vancomycin 1 gm IV X 1 given on 1/3 @ 20:00.  Vancomycin 1250 mg IV Q18H ordered to start on 1/4 @ 0300, ~ 7 hrs after 1st dose (stacked dosing). Pt will reach Css by 1/6 @ 1400. Will draw 1st trough on VT 1/6 @ 0830.   CrCl = 57.6 ml/min Ke = 0.052 hr-1 T1/2 = 13.3 hrs Vd = 53.2 L  Goal trough : 15 - 20 mcg/mL    Height: 5\' 9"  (175.3 cm) Weight: 200 lb (90.7 kg) IBW/kg (Calculated) : 66.2  Temp (24hrs), Avg:100 F (37.8 C), Min:99.5 F (37.5 C), Max:100.4 F (38 C)  Recent Labs  Lab 12/25/18 1850 12/25/18 1917  WBC 21.4*  --   CREATININE 1.06*  --   LATICACIDVEN  --  3.2*    Estimated Creatinine Clearance: 57.6 mL/min (A) (by C-G formula based on SCr of 1.06 mg/dL (H)).    Allergies  Allergen Reactions  . Flecainide Shortness Of Breath and Other (See Comments)    Reaction: dizziness   . Amiodarone Other (See Comments)    Pt states that this medication causes lung bleeding.      Antimicrobials this admission:   >>    >>   Dose adjustments this admission:   Microbiology results:  BCx:   UCx:    Sputum:    MRSA PCR:   Thank you for allowing pharmacy to be a part of this patient's care.  Tashima Scarpulla D 12/25/2018 10:14 PM

## 2018-12-25 NOTE — Consult Note (Signed)
CODE SEPSIS - PHARMACY COMMUNICATION  **Broad Spectrum Antibiotics should be administered within 1 hour of Sepsis diagnosis**  Time Code Sepsis Called/Page Received: 1854  Antibiotics Ordered: Meropenem (per pharmacy consult)  Time of 1st antibiotic administration: 1936  Additional action taken by pharmacy: Called when tubed medication to alert floor 1929  If necessary, Name of Provider/Nurse Contacted: [place] ,PharmD Clinical Pharmacist  12/25/2018  7:08 PM

## 2018-12-25 NOTE — Progress Notes (Signed)
eLink Physician-Brief Progress Note Patient Name: Betty [name] DOB: [birth year] MRN: [id number]   Date of Service  12/25/2018  HPI/Events of Note  Earlier accepted from ED. Discussed with Dr Mortimer Fries.  His note reviewed.  Camera assessment done. Soft MAP. Getting fluid bolus. Discussed with bed side RN. Dx: Sepsis from HCAP/UTI. Hx of Klebsiella uti in recent past. On Vanc/meropenum. Received 2 lit fluids. Has CHF/effusion also.on warf. For mechanical valve.    eICU Interventions  - follow LA. - continue current care - INR goal > 3.  Watch levels while on abx. - on nasla o2 for COPD/HCAP sats ok. Asp precautions.  - Flu neg.      Intervention Category Major Interventions: Respiratory failure - evaluation and management;Sepsis - evaluation and management Evaluation Type: New Patient Evaluation  Elmer Sow 12/25/2018, 11:53 PM

## 2018-12-25 NOTE — ED Triage Notes (Signed)
Pt to ED via EMS from home c/o confusion and lethargy that she states has been going on for a while.  Per EMS patient's son came to check on her today around 2pm and noticed she didn't take morning meds and had taken her 2L [place] chronic oxygen off for unknown amount of time, meds were given at this time by son but later noticed patient progressively still getting more confused and lethargic.  EMS vitals 102/50 BP, 239 CBG and placed on 4L [place].  Pt presents alert and oriented x4, denies pain or SOB, denies fevers or n/v/d, just tired.

## 2018-12-25 NOTE — Progress Notes (Addendum)
ANTICOAGULATION CONSULT NOTE - Initial Consult  Pharmacy Consult for  Warfarin  Indication: mechanical heart valve   Allergies  Allergen Reactions  . Flecainide Shortness Of Breath and Other (See Comments)    Reaction: dizziness   . Amiodarone Other (See Comments)    Pt states that this medication causes lung bleeding.      Patient Measurements: Height: 5\' 9"  (175.3 cm) Weight: 200 lb (90.7 kg) IBW/kg (Calculated) : 66.2 Heparin Dosing Weight:   Vital Signs: Temp: 100.4 F (38 C) (01/03 1839) Temp Source: Oral (01/03 1839) BP: 78/44 (01/03 2130) Pulse Rate: 71 (01/03 2130)  Labs: Recent Labs    12/25/18 1850  HGB 13.6  HCT 42.3  PLT 220  LABPROT 30.0*  INR 2.91  CREATININE 1.06*  TROPONINI 0.09*    Estimated Creatinine Clearance: 57.6 mL/min (A) (by C-G formula based on SCr of 1.06 mg/dL (H)).   Medical History: Past Medical History:  Diagnosis Date  . Acute diastolic heart failure ([place])   . Allergy   . 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 Asthma   . Atrial fibrillation ([place])   . Backache, unspecified   . Cancer ([place])    skin  . Cardiomegaly   . COPD (chronic obstructive pulmonary disease) ([place])   . Diabetes mellitus without complication ([place])   . 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 Esophageal reflux   . Headache(784.0)   . Herpes zoster without mention of complication   . Hx: UTI (urinary tract infection)   . Hypertension    heart controlled w CHF  . Nontoxic uninodular goiter   . Obesity, unspecified   . Osteoarthrosis, unspecified whether generalized or localized, unspecified site   . Unspecified sleep apnea   . Urine incontinence    hx of    Medications:  (Not in a hospital admission)   Assessment: Pharmacy consulted to dose warfarin for mechanical heart valve in this 73 year old female. 1/3 :  INR @ 18:50 = 2.91   Home warfarin dose :   Warfarin 8 mg PO Q M - F.  Warfarin 3 mg PO Sat Sun   Last dose given on 1/3 @ 14:00.  Goal of Therapy:  INR : 2.5 - 3.5  Monitor platelets by anticoagulation protocol: Yes   Plan:  Will continue pt home dose and check INR daily since pt is on abx.   Warfarin 3 mg PO Q Sat and Sun to continue on 1/4.  Warfarin 8 mg PO Q M - F to continue on 1/6.   Abbas Beyene D 12/25/2018,9:36 PM

## 2018-12-25 NOTE — Consult Note (Signed)
Pharmacy Antibiotic Note  Betty [name] is a 73 y.o. female admitted on 12/25/2018 with sepsis.  Pharmacy has been consulted for Meropenem dosing.    ER doctor wanted ESBL coverage based on prior blood culture/admissions.  Plan: Meropenem 1 g q8 hours   Height: 5\' 9"  (175.3 cm) Weight: 200 lb (90.7 kg) IBW/kg (Calculated) : 66.2  Temp (24hrs), Avg:100.4 F (38 C), Min:100.4 F (38 C), Max:100.4 F (38 C)  Recent Labs  Lab 12/25/18 1850  WBC 21.4*  CREATININE 1.06*    Estimated Creatinine Clearance: 57.6 mL/min (A) (by C-G formula based on SCr of 1.06 mg/dL (H)).    Allergies  Allergen Reactions  . Flecainide Shortness Of Breath and Other (See Comments)    Reaction: dizziness   . Amiodarone Other (See Comments)    Pt states that this medication causes lung bleeding.      Antimicrobials this admission: Meropenem 1/3 >>   Dose adjustments this admission: N/A  Microbiology results:  1/3 BCx: pending  Thank you for allowing pharmacy to be a part of this patient's care.  Lu Duffel, PharmD, BCPS Clinical Pharmacist 12/25/2018 7:52 PM

## 2018-12-25 NOTE — ED Provider Notes (Addendum)
[facility] Emergency Department Provider Note ____________________________________________   First MD Initiated Contact with Patient 12/25/18 [phone]     (approximate)  I have reviewed the triage vital signs and the nursing notes.  HISTORY  Chief Complaint Fatigue  HPI Betty [name] is a 73 y.o. female here for evaluation of just not feeling well  Patient reports has been fatigued feeling tired just not feeling well for a couple of days.  She still able to get up and walk, but is feels like she is progressively coming more more fatigued.   Denies abdominal pain.  No nausea vomiting.  Does use home oxygen, evidently had an episode where she seemed confused earlier according to family.  EMS noted her blood pressure be slightly soft, and concern for possible infection  Patient does report she has had infections in the past but not for some time.  She reports she is anticoagulated with a mechanical heart valve.  She has a very extensive past medical history including that including sepsis in the past.  She also has a history of ESBL which is obtained through the medical record.  She denies any pain or discomfort except in her lower back which is been present for years.  No changes there.  She also reports that she has abnormal eye movements that have been present for years without a nontoxic without a clear diagnosis but evaluated by many physicians.  Past Medical History:  Diagnosis Date  . Acute diastolic heart failure ([place])   . Allergy   . 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 Asthma   . Atrial fibrillation ([place])   . Backache, unspecified   . Cancer ([place])    skin  . Cardiomegaly   . COPD (chronic obstructive pulmonary disease) ([place])   . Diabetes mellitus without complication ([place])   . 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 Esophageal reflux   . Headache(784.0)   . Herpes zoster without mention of complication   . Hx: UTI  (urinary tract infection)   . Hypertension    heart controlled w CHF  . Nontoxic uninodular goiter   . Obesity, unspecified   . Osteoarthrosis, unspecified whether generalized or localized, unspecified site   . Unspecified sleep apnea   . Urine incontinence    hx of    Patient Active Problem List   Diagnosis Date Noted  . Severe sepsis ([place]) 12/25/2018  . Abdominal pain   . Goals of care, counseling/discussion   . Palliative care by specialist   . Respiratory failure, acute ([place]) 02/26/2018  . COPD (chronic obstructive pulmonary disease) ([place]) 02/26/2018  . C2 cervical fracture ([place]) 02/19/2018  . Hematuria 01/27/2018  . Carotid stenosis 12/23/2017  . Varicose veins of both lower extremities with pain 11/17/2017  . Acute on chronic diastolic CHF (congestive heart failure) ([place]) 08/13/2017  . Leg pain 07/14/2017  . Chronic venous insufficiency 07/14/2017  . PAD (peripheral artery disease) ([place]) 07/14/2017  . Postprocedural hemorrhage due to complication of oral surgery 03/27/2017  . Acute blood loss anemia 03/27/2017  . H/O mitral valve replacement with mechanical valve 03/27/2017  . Atypical atrial flutter ([place]) 01/23/2017  . Long term current use of anticoagulants with INR goal of 2.5-3.5 09/18/2016  . Syncope 06/25/2016  . UTI (urinary tract infection) 06/25/2016  . Afib ([place]) 05/10/2016  . Dyspnea 05/10/2016  . Chronic diastolic CHF (congestive heart failure) ([place]) 05/10/2016  . Anemia 05/10/2016  . Anemia 05/10/2016  .  Other specified abnormal immunological findings in serum 04/24/2016  . Sepsis ([place]) 04/01/2016  . Prolonged Q-T interval on ECG 03/13/2016  . Mitral stenosis 03/13/2016  . Interstitial lung disease ([place]) 12/29/2015  . Chronic LBP 07/26/2015  . Essential (primary) hypertension 07/26/2015  . Idiopathic insomnia 07/26/2015  . Restless leg 07/26/2015  . Obesity (BMI 30-39.9) 01/07/2015  . Abnormal EKG 09/11/2014  . External nasal lesion 02/26/2014  .  Abnormal liver function tests 01/28/2014  . Ankle pain, right 01/28/2014  . Arthralgia of ankle or foot 01/28/2014  . Urinary frequency 12/24/2013  . Hemorrhoids 12/24/2013  . Neck pain on right side 11/10/2013  . Vitamin D deficiency 09/09/2013  . Urinary incontinence 05/30/2013  . History of colonic polyps 05/30/2013  . Controlled type 2 diabetes mellitus without complication ([place]) 24/23/5361  . H/O deep venous thrombosis 05/05/2013  . History of pulmonary embolism 05/05/2013  . 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 OSA (obstructive sleep apnea) 05/05/2013  . Depression 04/13/2009  . THYROID NODULE 04/13/2009  . 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 GERD 04/13/2009  . OSTEOARTHRITIS 04/13/2009  . BACK PAIN 04/13/2009  . HEADACHE 04/13/2009  . Gastro-esophageal reflux disease without esophagitis 04/13/2009  . Mild episode of recurrent major depressive disorder ([place]) 04/13/2009  . Benign hypertensive heart disease with heart failure ([place]) 03/28/2009  . PAF (paroxysmal atrial fibrillation) ([place]) 03/28/2009  . VENTRICULAR HYPERTROPHY, LEFT 03/28/2009  . Hypertensive cardiomyopathy, with heart failure ([place]) 03/28/2009    Past Surgical History:  Procedure Laterality Date  . ABDOMINAL HYSTERECTOMY  1979   complete (for precancerous cells)  . ABLATION  2011 & 2014  . bladder botox  01/26/2018  . CHOLECYSTECTOMY    . CYSTOSCOPY WITH FULGERATION N/A 01/28/2018   Procedure: [place] AND CLOT EVACUATION;  Surgeon: Hollice Espy, MD;  Location: [facility] ORS;  Service: Urology;  Laterality: N/A;  . Interstim Placement  2012  . OOPHORECTOMY      Prior to Admission medications   Medication Sig Start Date End Date Taking? Authorizing Provider  acetaminophen (TYLENOL) 325 MG tablet Take 2 tablets (650 mg total) by mouth every 6 (six) hours as needed for mild pain (or Fever >/= 101). 02/04/18  Yes Gladstone Lighter, MD  ferrous sulfate 325 (65 FE) MG EC  tablet Take 325 mg by mouth daily.  06/23/17  Yes [provider]  gabapentin (NEURONTIN) 600 MG tablet Take 300-600 mg by mouth 3 (three) times daily. Take 2 capsules (600mg ) in morning, 1 capsule (300mg ) at midday, and 2 capsules (600mg ) at bedtime   Yes [provider]  levocetirizine (XYZAL) 5 MG tablet Take 5 mg by mouth at bedtime. 10/15/18  Yes [provider]  metFORMIN (GLUCOPHAGE) 1000 MG tablet Take 500 mg by mouth 2 (two) times daily with a meal.    Yes [provider]  metoprolol tartrate (LOPRESSOR) 25 MG tablet Take 25 mg by mouth 2 (two) times daily. 10/21/18  Yes [provider]  pantoprazole (PROTONIX) 40 MG tablet Take 40 mg by mouth daily. 10/15/18  Yes [provider]  potassium chloride (MICRO-K) 10 MEQ CR capsule Take 20 mEq by mouth 2 (two) times daily. 10/26/18  Yes [provider]  ranitidine (ZANTAC) 150 MG tablet Take 150 mg by mouth 2 (two) times daily.   Yes [provider]  rOPINIRole (REQUIP) 1 MG tablet Take 1 mg by mouth at bedtime. 09/18/18  Yes [provider]  spironolactone (ALDACTONE) 25 MG tablet Take 12.5  mg by mouth daily. 10/06/18  Yes [provider]  torsemide (DEMADEX) 20 MG tablet Take 80 mg by mouth 2 (two) times daily.   Yes [provider]  traZODone (DESYREL) 50 MG tablet Take 50 mg by mouth at bedtime.  10/26/18  Yes [provider]  venlafaxine XR (EFFEXOR-XR) 150 MG 24 hr capsule Take 150 mg by mouth daily. 10/02/18  Yes [provider]  Vitamin D, Ergocalciferol, (DRISDOL) 50000 units CAPS capsule Take 50,000 Units by mouth once a week. 10/21/18  Yes [provider]  warfarin (COUMADIN) 3 MG tablet Take 3 mg by mouth daily. On Saturday and Sunday   Yes [provider]  warfarin (COUMADIN) 4 MG tablet Take 8 mg by mouth daily. Monday - Friday   Yes [provider]  metolazone (ZAROXOLYN) 2.5 MG tablet Take 2.5  mg by mouth 2 (two) times a week. Take 2.5 mg by mouth on Monday and Thursday.    [provider]    Allergies Flecainide and Amiodarone  Family History  Problem Relation Age of Onset  . Heart attack Father   . Heart failure Father   . Arthritis Father   . Stroke Father   . Hypertension Father   . Coronary artery disease Brother   . Peripheral vascular disease Brother   . Arthritis Mother   . Colon cancer Mother        colon cancer  . Hypertension Mother   . Cancer Maternal Grandmother        colon cancer  . Arthritis Maternal Grandmother     Social History Social History   Tobacco Use  . Smoking status: Former Smoker    Packs/day: 0.50    Years: 15.00    Pack years: 7.50    Types: Cigarettes  . Smokeless tobacco: Never Used  . Tobacco comment: Has a 20-pack-year history, qutting in 1970.   Substance Use Topics  . Alcohol use: No    Alcohol/week: 0.0 standard drinks  . Drug use: No    Review of Systems Constitutional: Fatigue, chills but no known fever. Eyes: No visual changes. ENT: No sore throat.  Slight runny nose and an occasional dry cough with some nasal congestion for a couple of days Cardiovascular: Denies chest pain. Respiratory: Denies shortness of breath.  Does report she had a slight dry cough. Gastrointestinal: No abdominal pain.  No nausea or vomiting.  No diarrhea. Genitourinary: Negative for dysuria. Musculoskeletal: Negative for back pain. Skin: Negative for rash. Neurological: Negative for headaches, areas of focal weakness or numbness.    ____________________________________________   PHYSICAL EXAM:  VITAL SIGNS: ED Triage Vitals  Enc Vitals Group     BP 12/25/18 1833 (!) 92/51     Pulse Rate 12/25/18 1833 80     Resp 12/25/18 1833 15     Temp 12/25/18 1839 (!) 100.4 F (38 C)     Temp Source 12/25/18 1839 Oral     SpO2 12/25/18 1833 97 %     Weight 12/25/18 1840 200 lb (90.7 kg)     Height 12/25/18 1840 5\' 9"  (1.753  m)     Head Circumference --      Peak Flow --      Pain Score 12/25/18 1840 0     Pain Loc --      Pain Edu? --      Excl. in [place]? --     Constitutional: Alert and oriented to self and situation, also to  month.  Mildly ill-appearing but in no distress. Eyes: Conjunctivae are normal.  She has very unusual roving eye movements but when she intentionally looks at person or speaks to them her eyes focus well upon the person whom she is communicating with. Head: Atraumatic. Nose: No congestion/rhinnorhea. Mouth/Throat: Mucous membranes are dry. Neck: No stridor.  Cardiovascular: Normal rate, regular rhythm. Grossly normal heart sounds.  Good peripheral circulation. Respiratory: Normal respiratory effort.  No retractions. Lungs CTAB though somewhat diminished lung sounds bilaterally. Gastrointestinal: Soft and nontender. No distention.  Obese. Musculoskeletal: No lower extremity tenderness nor edema. Neurologic:  Normal speech and language. No gross focal neurologic deficits are appreciated.  Skin:  Skin is warm, dry and intact. No rash noted. Psychiatric: Mood and affect are normal. Speech and behavior are normal.  ____________________________________________   LABS (all labs ordered are listed, but only abnormal results are displayed)  Labs Reviewed  BASIC METABOLIC PANEL - Abnormal; Notable for the following components:      Result Value   Sodium 134 (*)    Chloride 92 (*)    Glucose, Bld 212 (*)    BUN 36 (*)    Creatinine, Ser 1.06 (*)    Calcium 8.8 (*)    GFR calc non Af Amer 52 (*)    All other components within normal limits  CBC - Abnormal; Notable for the following components:   WBC 21.4 (*)    RDW 16.3 (*)    All other components within normal limits  URINALYSIS, COMPLETE (UACMP) WITH MICROSCOPIC - Abnormal; Notable for the following components:   Color, Urine AMBER (*)    APPearance CLOUDY (*)    Hgb urine dipstick SMALL (*)    Protein, ur 30 (*)    Leukocytes,  UA LARGE (*)    WBC, UA >50 (*)    Bacteria, UA MANY (*)    All other components within normal limits  TROPONIN I - Abnormal; Notable for the following components:   Troponin I 0.09 (*)    All other components within normal limits  LACTIC ACID, PLASMA - Abnormal; Notable for the following components:   Lactic Acid, Venous 3.2 (*)    All other components within normal limits  PROTIME-INR - Abnormal; Notable for the following components:   Prothrombin Time 30.0 (*)    All other components within normal limits  HEMOGLOBIN A1C - Abnormal; Notable for the following components:   Hgb A1c MFr Bld 6.7 (*)    All other components within normal limits  GLUCOSE, CAPILLARY - Abnormal; Notable for the following components:   Glucose-Capillary 145 (*)    All other components within normal limits  CULTURE, BLOOD (ROUTINE X 2)  CULTURE, BLOOD (ROUTINE X 2)  EXPECTORATED SPUTUM ASSESSMENT W REFEX TO RESP CULTURE  MRSA PCR SCREENING  LIPASE, BLOOD  INFLUENZA PANEL BY PCR (TYPE A & B)  LACTIC ACID, PLASMA  BASIC METABOLIC PANEL  CBC  STREP PNEUMONIAE URINARY ANTIGEN  PROTIME-INR  CBG MONITORING, ED   ____________________________________________  EKG  Reviewed and entered by me at 1840 Heart rate 80 QRS 90 QTc 500 Atrial sensed, ventricular paced.  Nonspecific T wave abnormality.  No evidence of acute ischemia or ST elevation. ____________________________________________  RADIOLOGY  Dg Chest 2 View  Result Date: 12/25/2018 CLINICAL DATA:  Fever. Confusion and lethargy. EXAM: CHEST - 2 VIEW COMPARISON:  Radiographs 10/26/2018 FINDINGS: Post median sternotomy prosthetic aortic valve. 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 Mild cardiomegaly with unchanged mediastinal contours and aortic atherosclerosis.  Small right and possibly left pleural effusions, not significantly changed from prior exam. Interstitial opacities with slight progression from prior. More focal opacity in the periphery of the right  upper lobe is new. No pneumothorax. No acute osseous abnormalities are seen. IMPRESSION: 1. Focal opacity in the periphery of the right upper lobe suspicious for pneumonia in the setting of fever. Pleural fluid layering along the fissures also considered but felt less likely. 2. Interstitial opacities with slight progression from prior, may be pulmonary edema or atypical infection. 3. Small right and possibly left pleural effusions, not significantly changed. Electronically Signed   By: Keith Rake M.D.   On: 12/25/2018 19:38    Chest x-ray reviewed, concerning for possible pneumonia.  Please see full report above ____________________________________________   PROCEDURES  Procedure(s) performed: None  Procedures  Critical Care performed: Yes, see critical care note(s)  CRITICAL CARE Performed by: Delman Kitten   Total critical care time: 39 minutes  Critical care time was exclusive of separately billable procedures and treating other patients.  Critical care was necessary to treat or prevent imminent or life-threatening deterioration.  Critical care was time spent personally by me on the following activities: development of treatment plan with patient and/or surrogate as well as nursing, discussions with consultants, evaluation of patient's response to treatment, examination of patient, obtaining history from patient or surrogate, ordering and performing treatments and interventions, ordering and review of laboratory studies, ordering and review of radiographic studies, pulse oximetry and re-evaluation of patient's condition.  ____________________________________________   INITIAL IMPRESSION / ASSESSMENT AND PLAN / ED COURSE  Pertinent labs & imaging results that were available during my care of the patient were reviewed by me and considered in my medical decision making (see chart for details).   Concern for infection or other cause of febrile illness.  The associated  hypotension, congestion cough suspect likely viral or pulmonary source such as pneumonia, though she does also have a history of ESBL and frequent urinary tract infection.  Do not see cause for this to be an metabolic etiology or toxic etiology.  Her mental status is normal at present, but reported he was confused earlier.  Will initiate light fluid bolus of 500 mL's at this time as we await further work-up including labs and lactic acid.  ----------------------------------------- 7:19 PM on 12/25/2018 -----------------------------------------  Initiate code sepsis.  Patient's white count is notably elevated highly raising concern for bacteremia or sepsis.  I will initially start with meropenem as we further evaluate for source, but she has a known history of ESBL.  Discussed with Corene Cornea and pharmacy.     After initial fluid bolus given, blood pressure improving slightly.  We will give additional fluid bolus.  Notable lactic acidosis.  At one point, patient's blood pressure did drop into the 60s.  Additional fluid boluses ordered, patient being resuscitated with fluids, going to the ICU for ongoing care.  Patient status critical, tolerating hypoglycemia well with good alertness and mental status, blood pressure improving as additional boluses given.  Discussed with hospitalist, would consider central line placement after adequate fluid resuscitation has been provided (thus far has only had 1L NS).  Patient is anticoagulated raising the risk of central line insertion, and thus far continuing fluid resuscitation.  ____________________________________________   FINAL CLINICAL IMPRESSION(S) / ED DIAGNOSES  Final diagnoses:  Severe sepsis (HCC)  Hypotension, unspecified hypotension type  Urinary tract infection, acute  Lung infiltrate        Note:  This document  was prepared using Systems analyst and may include unintentional dictation errors       Delman Kitten,  MD 12/25/18 [id number]    Delman Kitten, MD 01/09/19 2041

## 2018-12-25 NOTE — Consult Note (Signed)
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  has a past surgical history that includes Oophorectomy; Cholecystectomy; Abdominal hysterectomy (1979); Ablation (2011 & 2014); Interstim Placement (2012); bladder botox (01/26/2018); and Cystoscopy with fulgeration (N/A, 01/28/2018). Prior to Admission medications   Medication Sig Start Date End Date Taking? Authorizing Provider  acetaminophen (TYLENOL) 325 MG tablet Take 2 tablets (650 mg total) by mouth every 6 (six) hours as needed for mild pain (or Fever >/= 101). 02/04/18  Yes Gladstone Lighter, MD  ferrous sulfate 325 (65 FE) MG EC tablet Take 325 mg by mouth daily.  06/23/17  Yes [provider]  gabapentin (NEURONTIN) 600 MG tablet Take 300-600 mg by mouth 3 (three) times daily. Take 2 capsules (600mg ) in morning, 1 capsule (300mg ) at midday, and 2  capsules (600mg ) at bedtime   Yes [provider]  levocetirizine (XYZAL) 5 MG tablet Take 5 mg by mouth at bedtime. 10/15/18  Yes [provider]  metFORMIN (GLUCOPHAGE) 1000 MG tablet Take 500 mg by mouth 2 (two) times daily with a meal.    Yes [provider]  metoprolol tartrate (LOPRESSOR) 25 MG tablet Take 25 mg by mouth 2 (two) times daily. 10/21/18  Yes [provider]  pantoprazole (PROTONIX) 40 MG tablet Take 40 mg by mouth daily. 10/15/18  Yes [provider]  potassium chloride (MICRO-K) 10 MEQ CR capsule Take 20 mEq by mouth 2 (two) times daily. 10/26/18  Yes [provider]  ranitidine (ZANTAC) 150 MG tablet Take 150 mg by mouth 2 (two) times daily.   Yes [provider]  rOPINIRole (REQUIP) 1 MG tablet Take 1 mg by mouth at bedtime. 09/18/18  Yes [provider]  spironolactone (ALDACTONE) 25 MG tablet Take 12.5 mg by mouth daily. 10/06/18  Yes [provider]  torsemide (DEMADEX) 20 MG tablet Take 80 mg by mouth 2 (two) times daily.   Yes [provider]  traZODone (DESYREL) 50 MG tablet Take 50 mg by mouth at bedtime.  10/26/18  Yes [provider]  venlafaxine XR (EFFEXOR-XR) 150 MG 24 hr capsule Take 150 mg by mouth daily. 10/02/18  Yes [provider]  Vitamin D, Ergocalciferol, (DRISDOL) 50000 units CAPS capsule Take 50,000 Units by mouth once a week. 10/21/18  Yes [provider]  warfarin (COUMADIN) 3 MG tablet Take 3 mg by mouth daily. On Saturday and Sunday   Yes [provider]  warfarin (COUMADIN) 4 MG tablet Take 8 mg by mouth daily. Monday - Friday   Yes [provider]  metolazone (ZAROXOLYN) 2.5 MG  tablet Take 2.5 mg by mouth 2 (two) times a week. Take 2.5 mg by mouth on Monday and Thursday.    [provider]   Allergies  Allergen Reactions  . Flecainide Shortness Of Breath and Other (See Comments)    Reaction: dizziness   .  Amiodarone Other (See Comments)    Pt states that this medication causes lung bleeding.      FAMILY HISTORY:  family history includes Arthritis in her father, maternal grandmother, and mother; Cancer in her maternal grandmother; Colon cancer in her mother; Coronary artery disease in her brother; Heart attack in her father; Heart failure in her father; Hypertension in her father and mother; Peripheral vascular disease in her brother; Stroke in her father. SOCIAL HISTORY:  reports that she has quit smoking. Her smoking use included cigarettes. She has a 7.50 pack-year smoking history. She has never used smokeless tobacco. She reports that she does not drink alcohol or use drugs.  REVIEW OF SYSTEMS:   Minimal  ROS due to lethargy   VITAL SIGNS: Temp:  [99.5 F (37.5 C)-100.4 F (38 C)] 99.5 F (37.5 C) (01/03 2200) Pulse Rate:  [70-83] 72 (01/03 2300) Resp:  [15-27] 19 (01/03 2300) BP: (65-92)/(44-59) 85/59 (01/03 2300) SpO2:  [84 %-100 %] 93 % (01/03 2300) Weight:  [90.7 kg] 90.7 kg (01/03 1840)  Physical Examination:  GENERAL:critically ill appearing, -resp distress HEAD: Normocephalic, atraumatic.  EYES: Pupils equal, round, reactive to light.  No scleral icterus.  MOUTH: Moist mucosal membrane. NECK: Supple. No JVD.  PULMONARY: +rhonchi, CARDIOVASCULAR: S1 and S2. Regular rate and rhythm. No murmurs, rubs, or gallops.  GASTROINTESTINAL: Soft, nontender, -distended. No masses. Positive bowel sounds. No hepatosplenomegaly.  MUSCULOSKELETAL: No swelling, clubbing, or edema.  NEUROLOGIC: lethargic  SKIN:intact,warm,dry    ASSESSMENT / PLAN:  73 yo admitted for severe sepsis from RT lung pneumonia in setting of dCHF failure with metabolic acidosis and metabolic encephalopathy  Septic shock -use vasopressors to keep MAP>60 if needed -follow ABG and LA -follow up cultures -emperic ABX -consider stress dose steroids -IV fluid Resuscitation   ID pneumonia HCAP emperic  abx Neb therapy as needed  ELECTROLYTES -follow labs as needed -replace as needed -pharmacy consultation and following    DVT/GI PRX ordered TRANSFUSIONS AS NEEDED MONITOR FSBS ASSESS the need for LABS as needed    Prognosis is poor Remains full code   Corrin Parker, M.D.  [facility]  Medical Director [place] Director [place] Department

## 2018-12-26 ENCOUNTER — Inpatient Hospital Stay (HOSPITAL_COMMUNITY)
Admit: 2018-12-26 | Discharge: 2018-12-26 | Disposition: A | Discharge status: Home / Self Care | Payer: Medicare Other | Attending: Internal Medicine | Admitting: Internal Medicine

## 2018-12-26 ENCOUNTER — Inpatient Hospital Stay: Payer: Medicare Other

## 2018-12-26 DIAGNOSIS — J9601 Acute respiratory failure with hypoxia: Secondary | ICD-10-CM

## 2018-12-26 LAB — ECHOCARDIOGRAM COMPLETE
Height: 69 in
Weight: 3200 oz

## 2018-12-26 LAB — BASIC METABOLIC PANEL
Anion gap: 7 (ref 5–15)
BUN: 36 mg/dL — ABNORMAL HIGH (ref 8–23)
CHLORIDE: 100 mmol/L (ref 98–111)
CO2: 29 mmol/L (ref 22–32)
Calcium: 7.7 mg/dL — ABNORMAL LOW (ref 8.9–10.3)
Creatinine, Ser: 1.06 mg/dL — ABNORMAL HIGH (ref 0.44–1.00)
GFR calc Af Amer: >60 mL/min (ref >60)
GFR calc non Af Amer: 52 mL/min — ABNORMAL LOW (ref >60)
Glucose, Bld: 176 mg/dL — ABNORMAL HIGH (ref 70–99)
Potassium: 3 mmol/L — ABNORMAL LOW (ref 3.5–5.1)
Sodium: 136 mmol/L (ref 135–145)

## 2018-12-26 LAB — PROTIME-INR
INR: 3.06
PROTHROMBIN TIME: 31.2 s — AB (ref 11.4–15.2)

## 2018-12-26 LAB — CBC
HCT: 42.1 % (ref 36.0–46.0)
HEMOGLOBIN: 12.9 g/dL (ref 12.0–15.0)
MCH: 29.3 pg (ref 26.0–34.0)
MCHC: 30.6 g/dL (ref 30.0–36.0)
MCV: 95.5 fL (ref 80.0–100.0)
Platelets: 163 10*3/uL (ref 150–400)
RBC: 4.41 MIL/uL (ref 3.87–5.11)
RDW: 16.2 % — ABNORMAL HIGH (ref 11.5–15.5)
WBC: 15.8 10*3/uL — ABNORMAL HIGH (ref 4.0–10.5)
nRBC: 0 % (ref 0.0–0.2)

## 2018-12-26 LAB — MAGNESIUM: Magnesium: 2 mg/dL (ref 1.7–2.4)

## 2018-12-26 LAB — MRSA PCR SCREENING: MRSA by PCR: NEGATIVE

## 2018-12-26 LAB — GLUCOSE, CAPILLARY
GLUCOSE-CAPILLARY: 186 mg/dL — AB (ref 70–99)
Glucose-Capillary: 144 mg/dL — ABNORMAL HIGH (ref 70–99)
Glucose-Capillary: 203 mg/dL — ABNORMAL HIGH (ref 70–99)
Glucose-Capillary: 141 mg/dL — ABNORMAL HIGH (ref 70–99)
Glucose-Capillary: 219 mg/dL — ABNORMAL HIGH (ref 70–99)

## 2018-12-26 LAB — LACTIC ACID, PLASMA: Lactic Acid, Venous: 2 mmol/L (ref 0.5–1.9)

## 2018-12-26 MED ORDER — SODIUM CHLORIDE 0.9 % IV SOLN
1.0000 g | Freq: Three times a day (TID) | INTRAVENOUS | Status: DC
  Administered 2018-12-26 – 2018-12-28 (×7): 1 g via INTRAVENOUS
  Filled 2018-12-26 (×9): qty 1

## 2018-12-26 MED ORDER — ALPRAZOLAM 1 MG PO TABS
1.0000 mg | ORAL_TABLET | Freq: Once | ORAL | Status: AC
  Administered 2018-12-26: 1 mg via ORAL
  Filled 2018-12-26: qty 1

## 2018-12-26 MED ORDER — POTASSIUM CHLORIDE 10 MEQ/100ML IV SOLN
10.0000 meq | INTRAVENOUS | Status: AC
  Administered 2018-12-26 (×2): 10 meq via INTRAVENOUS
  Filled 2018-12-26 (×2): qty 100

## 2018-12-26 NOTE — Progress Notes (Signed)
ANTICOAGULATION CONSULT NOTE   Pharmacy Consult for  Warfarin  Indication: mechanical heart valve   Allergies  Allergen Reactions  . Flecainide Shortness Of Breath and Other (See Comments)    Reaction: dizziness   . Amiodarone Other (See Comments)    Pt states that this medication causes lung bleeding.      Patient Measurements: Height: 5\' 9"  (175.3 cm) Weight: 200 lb (90.7 kg) IBW/kg (Calculated) : 66.2 Heparin Dosing Weight:   Vital Signs: Temp: 98 F (36.7 C) (01/04 0720) Temp Source: Oral (01/04 0720) BP: 123/59 (01/04 1400) Pulse Rate: 79 (01/04 1400)  Labs: Recent Labs    12/25/18 1850 12/26/18 0442  HGB 13.6 12.9  HCT 42.3 42.1  PLT 220 163  LABPROT 30.0* 31.2*  INR 2.91 3.06  CREATININE 1.06* 1.06*  TROPONINI 0.09*  --     Estimated Creatinine Clearance: 57.6 mL/min (A) (by C-G formula based on SCr of 1.06 mg/dL (H)).   Medical History: Past Medical History:  Diagnosis Date  . Acute diastolic heart failure ([place])   . Allergy   . 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 Asthma   . Atrial fibrillation ([place])   . Backache, unspecified   . Cancer ([place])    skin  . Cardiomegaly   . COPD (chronic obstructive pulmonary disease) ([place])   . Diabetes mellitus without complication ([place])   . 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 Esophageal reflux   . Headache(784.0)   . Herpes zoster without mention of complication   . Hx: UTI (urinary tract infection)   . Hypertension    heart controlled w CHF  . Nontoxic uninodular goiter   . Obesity, unspecified   . Osteoarthrosis, unspecified whether generalized or localized, unspecified site   . Unspecified sleep apnea   . Urine incontinence    hx of    Medications:  Medications Prior to Admission  Medication Sig Dispense Refill Last Dose  . acetaminophen (TYLENOL) 325 MG tablet Take 2 tablets (650 mg total) by mouth every 6 (six) hours as needed for mild pain (or Fever >/= 101). 30  tablet 0 prn at prn  . ferrous sulfate 325 (65 FE) MG EC tablet Take 325 mg by mouth daily.   11 12/25/2018 at 1400  . gabapentin (NEURONTIN) 600 MG tablet Take 300-600 mg by mouth 3 (three) times daily. Take 2 capsules (600mg ) in morning, 1 capsule (300mg ) at midday, and 2 capsules (600mg ) at bedtime   12/25/2018 at 1400  . levocetirizine (XYZAL) 5 MG tablet Take 5 mg by mouth at bedtime.   12/24/2018 at 2100  . metFORMIN (GLUCOPHAGE) 1000 MG tablet Take 500 mg by mouth 2 (two) times daily with a meal.    12/25/2018 at 1400  . metoprolol tartrate (LOPRESSOR) 25 MG tablet Take 25 mg by mouth 2 (two) times daily.   12/25/2018 at 1400  . pantoprazole (PROTONIX) 40 MG tablet Take 40 mg by mouth daily.   12/25/2018 at 1400  . potassium chloride (MICRO-K) 10 MEQ CR capsule Take 20 mEq by mouth 2 (two) times daily.   12/25/2018 at 1400  . ranitidine (ZANTAC) 150 MG tablet Take 150 mg by mouth 2 (two) times daily.   12/25/2018 at 1400  . rOPINIRole (REQUIP) 1 MG tablet Take 1 mg by mouth at bedtime.   12/25/2018 at 1400  . spironolactone (ALDACTONE) 25 MG tablet Take 12.5 mg by mouth daily.   12/25/2018 at 1400  . torsemide (  DEMADEX) 20 MG tablet Take 80 mg by mouth 2 (two) times daily.   12/25/2018 at 1400  . traZODone (DESYREL) 50 MG tablet Take 50 mg by mouth at bedtime.    12/24/2018 at 2100  . venlafaxine XR (EFFEXOR-XR) 150 MG 24 hr capsule Take 150 mg by mouth daily.   12/25/2018 at 1400  . Vitamin D, Ergocalciferol, (DRISDOL) 50000 units CAPS capsule Take 50,000 Units by mouth once a week.   Past Week at Unknown time  . warfarin (COUMADIN) 3 MG tablet Take 3 mg by mouth daily. On Saturday and Sunday   Past Week at Unknown time  . warfarin (COUMADIN) 4 MG tablet Take 8 mg by mouth daily. Monday - Friday   12/25/2018 at 1400  . metolazone (ZAROXOLYN) 2.5 MG tablet Take 2.5 mg by mouth 2 (two) times a week. Take 2.5 mg by mouth on Monday and Thursday.   Not Taking at Unknown time    Assessment: Pharmacy consulted to dose  warfarin for mechanical heart valve in this 73 year old female. 1/3 :  INR @ 18:50 = 2.91   Home warfarin dose :   Warfarin 8 mg PO Q M - F.                                         Warfarin 3 mg PO Sat Sun   Last dose given on 1/3 @ 14:00.  Goal of Therapy:  INR : 2.5 - 3.5  Monitor platelets by anticoagulation protocol: Yes   Plan:  Will continue pt home dose and check INR daily since pt is on abx.   Warfarin 3 mg PO Q Sat and Sun to continue on 1/4.  Warfarin 8 mg PO Q M - F to continue on 1/6.   Simpson,Michael L 12/26/2018,2:31 PM

## 2018-12-26 NOTE — Progress Notes (Signed)
Pharmacy Antibiotic Note  Betty [name] is a 73 y.o. female admitted on 12/25/2018 with HCAP, sepsis .  Pharmacy has been consulted for Vancomycin and Meropenem dosing.  Plan: Vancomycin 1 gm IV X 1 given on 1/3 @ 20:00.  Vancomycin 1250 mg IV Q18H ordered to start on 1/4 @ 0300, ~ 7 hrs after 1st dose (stacked dosing). Pt will reach Css by 1/6 @ 1400. Will draw 1st trough on VT 1/6 @ 0830.   CrCl = 57.6 ml/min Ke = 0.052 hr-1 T1/2 = 13.3 hrs Vd = 53.2 L  Goal trough : 15 - 20 mcg/mL   Meropenem 1g IV q8h  Height: 5\' 9"  (175.3 cm) Weight: 200 lb (90.7 kg) IBW/kg (Calculated) : 66.2  Temp (24hrs), Avg:99.5 F (37.5 C), Min:98.5 F (36.9 C), Max:100.4 F (38 C)  Recent Labs  Lab 12/25/18 1850 12/25/18 1917 12/25/18 2212  WBC 21.4*  --   --   CREATININE 1.06*  --   --   LATICACIDVEN  --  3.2* 2.0*    Estimated Creatinine Clearance: 57.6 mL/min (A) (by C-G formula based on SCr of 1.06 mg/dL (H)).    Allergies  Allergen Reactions  . Flecainide Shortness Of Breath and Other (See Comments)    Reaction: dizziness   . Amiodarone Other (See Comments)    Pt states that this medication causes lung bleeding.      Antimicrobials this admission:  Vancomycin 1/3 >>   Meropenem 1/3 >>   Dose adjustments this admission:   Microbiology results:  BCx:   UCx:    Sputum:    MRSA PCR:   Thank you for allowing pharmacy to be a part of this patient's care.  Paulina Fusi, PharmD, BCPS 12/26/2018 12:41 AM

## 2018-12-26 NOTE — Progress Notes (Signed)
Late entering . Pt was admitted early from ED. S/o sepsis/PNA. Pt with low Bp 85/59 and Elink Md was notified, Elink RN stated MD in Code Blue situation. Stated Dr Mortimer Fries is on his way and will address the BP when he gets here. Pt was given one lt normal saline Bolus as ordered by Dr Mortimer Fries. BP stable now will cont to monitor.

## 2018-12-26 NOTE — Progress Notes (Signed)
Follow up - Critical Care Medicine Note  Patient Details:    Betty [name] is an 73 y.o. female.  With past medical history complicated to include vasculitis, COPD, mechanical valve on Coumadin, diastolic heart failure, chronic respiratory failure, recent admission to the hospital for Klebsiella UTI and bacteremia returns to the emergency room with confusion, weakness and shortness of breath x1 day.  Patient was noted to have right-sided pneumonia and hypotension, was subsequently admitted to the intensive care unit.  Lines, Airways, Drains: External Urinary Catheter (Active)  Collection Container Dedicated Suction Canister 12/26/2018  7:20 AM  Output (mL) 200 mL 12/26/2018  7:20 AM    Anti-infectives:  Anti-infectives (From admission, onward)   Start     Dose/Rate Route Frequency Ordered Stop   12/26/18 0400  meropenem (MERREM) 1 g in sodium chloride 0.9 % 100 mL IVPB     1 g 200 mL/hr over 30 Minutes Intravenous Every 8 hours 12/26/18 0039     12/26/18 0300  vancomycin (VANCOCIN) 1,250 mg in sodium chloride 0.9 % 250 mL IVPB     1,250 mg 166.7 mL/hr over 90 Minutes Intravenous Every 18 hours 12/25/18 2222     12/25/18 2015  vancomycin (VANCOCIN) IVPB 1000 mg/200 mL premix     1,000 mg 200 mL/hr over 60 Minutes Intravenous  Once 12/25/18 2015 12/25/18 2124   12/25/18 1930  meropenem (MERREM) 1 g in sodium chloride 0.9 % 100 mL IVPB     1 g 200 mL/hr over 30 Minutes Intravenous  Once 12/25/18 1924 12/25/18 2012      Microbiology: Results for orders placed or performed during the hospital encounter of 12/25/18  Blood Culture (routine x 2)     Status: None (Preliminary result)   Collection Time: 12/25/18  7:17 PM  Result Value Ref Range Status   Specimen Description BLOOD BLOOD RIGHT HAND  Final   Special Requests   Final    BOTTLES DRAWN AEROBIC AND ANAEROBIC Blood Culture adequate volume   Culture   Final    NO GROWTH < 12 HOURS Performed at [facility], [address] [place], [place] [address]    Report Status PENDING  Incomplete  Blood Culture (routine x 2)     Status: None (Preliminary result)   Collection Time: 12/25/18  7:17 PM  Result Value Ref Range Status   Specimen Description BLOOD BLOOD LEFT HAND  Final   Special Requests   Final    BOTTLES DRAWN AEROBIC AND ANAEROBIC Blood Culture results may not be optimal due to an inadequate volume of blood received in culture bottles   Culture   Final    NO GROWTH < 12 HOURS Performed at [facility], [address] [place], [place] [address]    Report Status PENDING  Incomplete  MRSA PCR Screening     Status: None   Collection Time: 12/25/18 10:21 PM  Result Value Ref Range Status   MRSA by PCR NEGATIVE NEGATIVE Final    Comment:        The GeneXpert MRSA Assay (FDA approved for NASAL specimens only), is one component of a comprehensive MRSA colonization surveillance program. It is not intended to diagnose MRSA infection nor to guide or monitor treatment for MRSA infections. Performed at [facility], [address], [place], [place] [address]      Studies: Dg Chest 2 View  Result Date: 12/25/2018 CLINICAL DATA:  Fever. Confusion and lethargy. EXAM: CHEST - 2 VIEW  COMPARISON:  Radiographs 10/26/2018 FINDINGS: Post median sternotomy prosthetic aortic valve. 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 Mild cardiomegaly with unchanged mediastinal contours and aortic atherosclerosis. Small right and possibly left pleural effusions, not significantly changed from prior exam. Interstitial opacities with slight progression from prior. More focal opacity in the periphery of the right upper lobe is new. No pneumothorax. No acute osseous abnormalities are seen. IMPRESSION: 1. Focal opacity in the periphery of the right upper lobe suspicious for pneumonia in the setting of fever. Pleural fluid layering along the fissures also considered but felt less likely. 2. Interstitial opacities with slight  progression from prior, may be pulmonary edema or atypical infection. 3. Small right and possibly left pleural effusions, not significantly changed. Electronically Signed   By: Keith Rake M.D.   On: 12/25/2018 19:38    Consults:    Subjective:    Overnight Issues: Patient received 900 cc net fluid positive, did not require pressors, is presently on meropenem and vancomycin along with Coumadin.  Objective:  Vital signs for last 24 hours: Temp:  [98 F (36.7 C)-100.4 F (38 C)] 98 F (36.7 C) (01/04 0720) Pulse Rate:  [70-83] 78 (01/04 0800) Resp:  [15-32] 27 (01/04 0800) BP: (65-132)/(37-78) 132/78 (01/04 0800) SpO2:  [84 %-100 %] 93 % (01/04 0800) Weight:  [90.7 kg] 90.7 kg (01/03 1840)  Hemodynamic parameters for last 24 hours:    Intake/Output from previous day: 01/03 0701 - 01/04 0700 In: 1199.8 [I.V.:652.3; IV Piggyback:547.5] Out: 300 [Urine:300]  Intake/Output this shift: Total I/O In: -  Out: 200 [Urine:200]  Vent settings for last 24 hours:    Physical Exam:  GENERAL: She is awake and alert, communicating this morning HEAD: Normocephalic, atraumatic.  EYES: Pupils equal, round, reactive to light.  No scleral icterus.  MOUTH: Moist mucosal membrane. NECK: Supple. No JVD.  PULMONARY: +rhonchi, CARDIOVASCULAR: S1 and S2. Regular rate and rhythm. No murmurs, rubs, or gallops.  GASTROINTESTINAL: Soft, nontender, -distended. No masses. Positive bowel sounds. No hepatosplenomegaly.  MUSCULOSKELETAL: No swelling, clubbing, or edema.  NEUROLOGIC: lethargic  SKIN:intact,warm,dry  Assessment/Plan:   Pneumonia.  Chest x-ray is consistent with right-sided pneumonia.  Patient also has a leukocytosis with a white count of 15.8 and an elevated lactic acid at 2.  At this point patient has not required pressor support.  Is presently on vancomycin and meropenem, pending all cultures  UTI.  Patient has many bacteria and large leukocytes and WBCs.  Recently was treated  for Klebsiella UTI.  Hypokalemia.  Will replace  Mechanical aortic valve replacement.  Presently on Coumadin, INR is 3  Restless leg syndrome.  On ropinirole  Diabetes mellitus.  On insulin with scale  Complicated past medical history to include ANCA associated vasculitis, pulmonary alveolar hemorrhage in particular associated with amiodarone  Critical care time 30 minutes  Hermelinda Dellen, DO  [place] 12/26/2018  *Care during the described time interval was provided by me and/or other providers on the critical care team.  I have reviewed this patient's available data, including medical history, events of note, physical examination and test results as part of my evaluation.

## 2018-12-26 NOTE — Progress Notes (Addendum)
[place] at [place] NAME: Betty [name]    MR#:  [id number]  DATE OF BIRTH:  14-Oct-1946  SUBJECTIVE:  CHIEF COMPLAINT:   Chief Complaint  Patient presents with  . Fatigue   Patient is somnolent and on BiPAP. REVIEW OF SYSTEMS:  Review of Systems  Unable to perform ROS: Mental status change    DRUG ALLERGIES:   Allergies  Allergen Reactions  . Flecainide Shortness Of Breath and Other (See Comments)    Reaction: dizziness   . Amiodarone Other (See Comments)    Pt states that this medication causes lung bleeding.     VITALS:  Blood pressure 132/78, pulse 81, temperature 98.5 F (36.9 C), temperature source Axillary, resp. rate 19, height 5\' 9"  (1.753 m), weight 90.7 kg, SpO2 94 %. PHYSICAL EXAMINATION:  Physical Exam Constitutional:      General: She is not in acute distress.    Appearance: She is ill-appearing.  HENT:     Head: Normocephalic.  Eyes:     General: No scleral icterus.    Conjunctiva/sclera: Conjunctivae normal.     Pupils: Pupils are equal, round, and reactive to light.  Neck:     Musculoskeletal: Normal range of motion and neck supple.     Vascular: No JVD.     Trachea: No tracheal deviation.  Cardiovascular:     Rate and Rhythm: Normal rate and regular rhythm.     Heart sounds: Normal heart sounds. No murmur. No gallop.   Pulmonary:     Effort: Pulmonary effort is normal. No respiratory distress.     Breath sounds: No stridor. No wheezing, rhonchi or rales.     Comments: Crackles Abdominal:     General: Bowel sounds are normal. There is no distension.     Palpations: Abdomen is soft.     Tenderness: There is no abdominal tenderness. There is no rebound.  Musculoskeletal: Normal range of motion.        General: No tenderness.  Skin:    Findings: No erythema or rash.  Neurological:     Mental Status: She is alert.     Cranial Nerves: No cranial nerve deficit.     Comments: Unable to exam.    Psychiatric:     Comments: Drowsy and somnolent    LABORATORY PANEL:  Female CBC Recent Labs  Lab 12/26/18 0442  WBC 15.8*  HGB 12.9  HCT 42.1  PLT 163   ------------------------------------------------------------------------------------------------------------------ Chemistries  Recent Labs  Lab 12/26/18 0442  NA 136  K 3.0*  CL 100  CO2 29  GLUCOSE 176*  BUN 36*  CREATININE 1.06*  CALCIUM 7.7*   RADIOLOGY:  Dg Chest 2 View  Result Date: 12/25/2018 CLINICAL DATA:  Fever. Confusion and lethargy. EXAM: CHEST - 2 VIEW COMPARISON:  Radiographs 10/26/2018 FINDINGS: Post median sternotomy prosthetic aortic valve. 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 Mild cardiomegaly with unchanged mediastinal contours and aortic atherosclerosis. Small right and possibly left pleural effusions, not significantly changed from prior exam. Interstitial opacities with slight progression from prior. More focal opacity in the periphery of the right upper lobe is new. No pneumothorax. No acute osseous abnormalities are seen. IMPRESSION: 1. Focal opacity in the periphery of the right upper lobe suspicious for pneumonia in the setting of fever. Pleural fluid layering along the fissures also considered but felt less likely. 2. Interstitial opacities with slight progression from prior, may be pulmonary edema or atypical infection. 3. Small right and possibly  left pleural effusions, not significantly changed. Electronically Signed   By: Keith Rake M.D.   On: 12/25/2018 19:38   Dg Chest Port 1 View  Result Date: 12/26/2018 CLINICAL DATA:  Evaluate for pneumonia. EXAM: PORTABLE CHEST 1 VIEW COMPARISON:  12/25/2018 FINDINGS: There is a left chest wall pacer device with lead in the right atrial appendage and right ventricle. Stable cardiac enlargement. Bilateral pleural effusions and pulmonary edema noted compatible with CHF. Asymmetric opacity within the right upper lobe is unchanged from previous exam. IMPRESSION: 1.  Congestive heart failure. 2. No change in lateral opacity within the right upper lobe compatible with pneumonia. Electronically Signed   By: Kerby Moors M.D.   On: 12/26/2018 16:53   ASSESSMENT AND PLAN:    *Acute on chronic respiratory failure with hypoxia due to sepsis secondary to healthcare acquired pneumonia and UTI. She is on BiPAP, transition to oxygen by nasal cannula, continue nebulizer. Continue meropenem and vancomycin,  follow-up CBC and cultures.  Somnolence due to above.  Lactic acidosis due to above.  Improved with treatment.  Hypokalemia.  Potassium supplement.  *COPD with chronic respiratory failure.  Home oxygen 2 L.  *Diabetes mellitus.  Sliding scale insulin added.  *Paroxysmal atrial fibrillation and S/P mechanical aortic valve replacement. .  Continue Coumadin.  Metoprolol held due to hypotension.  Chronic diastolic CHF, LV EF: 59% -   65%. Hold diuretics due to soft blood pressure.  Diabetes.  Sliding scale.  I discussed with Dr. Jefferson Fuel. All the records are reviewed and case discussed with Care Management/Social Worker. Management plans discussed with the patient, family and they are in agreement.  CODE STATUS: Full Code  TOTAL TIME TAKING CARE OF THIS PATIENT: 28 minutes.   More than 50% of the time was spent in counseling/coordination of care: YES  POSSIBLE D/C IN 3 DAYS, DEPENDING ON CLINICAL CONDITION.   Demetrios Loll M.D on 12/26/2018 at 4:56 PM  Between 7am to 6pm - Pager - [phone]  After 6pm go to [email or web address] - [facility]

## 2018-12-26 NOTE — Progress Notes (Signed)
CRITICAL VALUE ALERT  Critical Value:  Lactic 2.2  Date & Time Notied:  12/26/2018 0010  Provider Notified: Dr Mortimer Fries  Orders Received/Actions taken: see previous result

## 2018-12-27 LAB — BASIC METABOLIC PANEL
Anion gap: 5 (ref 5–15)
BUN: 27 mg/dL — ABNORMAL HIGH (ref 8–23)
CO2: 28 mmol/L (ref 22–32)
Calcium: 8 mg/dL — ABNORMAL LOW (ref 8.9–10.3)
Chloride: 101 mmol/L (ref 98–111)
Creatinine, Ser: 0.77 mg/dL (ref 0.44–1.00)
GFR calc non Af Amer: >60 mL/min (ref >60)
Glucose, Bld: 279 mg/dL — ABNORMAL HIGH (ref 70–99)
Potassium: 4 mmol/L (ref 3.5–5.1)
Sodium: 134 mmol/L — ABNORMAL LOW (ref 135–145)

## 2018-12-27 LAB — CBC
HCT: 39.7 % (ref 36.0–46.0)
Hemoglobin: 11.9 g/dL — ABNORMAL LOW (ref 12.0–15.0)
MCH: 28.9 pg (ref 26.0–34.0)
MCHC: 30 g/dL (ref 30.0–36.0)
MCV: 96.4 fL (ref 80.0–100.0)
NRBC: 0 % (ref 0.0–0.2)
Platelets: 163 10*3/uL (ref 150–400)
RBC: 4.12 MIL/uL (ref 3.87–5.11)
RDW: 16.4 % — ABNORMAL HIGH (ref 11.5–15.5)
WBC: 8.9 10*3/uL (ref 4.0–10.5)

## 2018-12-27 LAB — PROTIME-INR
INR: 2.57
Prothrombin Time: 27.2 seconds — ABNORMAL HIGH (ref 11.4–15.2)

## 2018-12-27 LAB — GLUCOSE, CAPILLARY
Glucose-Capillary: 227 mg/dL — ABNORMAL HIGH (ref 70–99)
Glucose-Capillary: 254 mg/dL — ABNORMAL HIGH (ref 70–99)
Glucose-Capillary: 160 mg/dL — ABNORMAL HIGH (ref 70–99)
Glucose-Capillary: 147 mg/dL — ABNORMAL HIGH (ref 70–99)

## 2018-12-27 MED ORDER — KETOROLAC TROMETHAMINE 15 MG/ML IJ SOLN
15.0000 mg | Freq: Once | INTRAMUSCULAR | Status: AC
  Administered 2018-12-27: 15 mg via INTRAVENOUS
  Filled 2018-12-27: qty 1

## 2018-12-27 MED ORDER — INSULIN GLARGINE 100 UNIT/ML ~~LOC~~ SOLN
10.0000 [IU] | Freq: Every day | SUBCUTANEOUS | Status: DC
  Administered 2018-12-27 – 2018-12-29 (×3): 10 [IU] via SUBCUTANEOUS
  Filled 2018-12-27 (×5): qty 0.1

## 2018-12-27 MED ORDER — SENNA 8.6 MG PO TABS: 1.0000 | ORAL_TABLET | Freq: Every day | ORAL | Status: DC | PRN

## 2018-12-27 MED ORDER — BISACODYL 5 MG PO TBEC: 5.0000 mg | DELAYED_RELEASE_TABLET | Freq: Every day | ORAL | Status: DC | PRN

## 2018-12-27 MED ORDER — VENLAFAXINE HCL ER 75 MG PO CP24
150.0000 mg | ORAL_CAPSULE | Freq: Every day | ORAL | Status: DC
  Administered 2018-12-27 – 2018-12-29 (×3): 150 mg via ORAL
  Filled 2018-12-27 (×3): qty 2

## 2018-12-27 MED ORDER — MORPHINE SULFATE (PF) 4 MG/ML IV SOLN
4.0000 mg | INTRAVENOUS | Status: DC | PRN
  Administered 2018-12-27 – 2018-12-28 (×3): 4 mg via INTRAVENOUS
  Filled 2018-12-27 (×3): qty 1

## 2018-12-27 MED ORDER — OXYCODONE-ACETAMINOPHEN 5-325 MG PO TABS
1.0000 | ORAL_TABLET | Freq: Four times a day (QID) | ORAL | Status: DC | PRN
  Administered 2018-12-27 – 2018-12-29 (×4): 1 via ORAL
  Filled 2018-12-27 (×4): qty 1

## 2018-12-27 MED ORDER — IPRATROPIUM-ALBUTEROL 0.5-2.5 (3) MG/3ML IN SOLN
3.0000 mL | Freq: Four times a day (QID) | RESPIRATORY_TRACT | Status: DC
  Administered 2018-12-27 – 2018-12-29 (×7): 3 mL via RESPIRATORY_TRACT
  Filled 2018-12-27 (×9): qty 3

## 2018-12-27 NOTE — Progress Notes (Addendum)
[place] [facility] [place] NAME: [name]    MR#:  [id number]  DATE OF BIRTH:  May 08, 1946  SUBJECTIVE:  CHIEF COMPLAINT:   Chief Complaint  Patient presents with  . Fatigue   Patient is on oxygen 3 L by nasal cannula, off BiPAP.  She feels better, only complains of bilateral leg needle pain. REVIEW OF SYSTEMS:  Review of Systems  Unable to perform ROS: Mental status change  Constitutional: Positive for malaise/fatigue. Negative for chills and fever.  HENT: Negative for sore throat.   Eyes: Negative for blurred vision and double vision.  Respiratory: Negative for cough, hemoptysis, shortness of breath, wheezing and stridor.   Cardiovascular: Negative for chest pain, palpitations, orthopnea and leg swelling.  Gastrointestinal: Negative for abdominal pain, blood in stool, diarrhea, melena, nausea and vomiting.  Genitourinary: Negative for dysuria, flank pain and hematuria.  Musculoskeletal: Negative for back pain and joint pain.       Bilateral leg pain.  Skin: Negative for rash.  Neurological: Negative for dizziness, sensory change, focal weakness, seizures, loss of consciousness, weakness and headaches.  Endo/Heme/Allergies: Negative for polydipsia.  Psychiatric/Behavioral: Negative for depression. The patient is not nervous/anxious.     DRUG ALLERGIES:   Allergies  Allergen Reactions  . Flecainide Shortness Of Breath and Other (See Comments)    Reaction: dizziness   . Amiodarone Other (See Comments)    Pt states that this medication causes lung bleeding.     VITALS:  Blood pressure 123/70, pulse 84, temperature (!) 97.5 F (36.4 C), temperature source Oral, resp. rate 18, height 5\' 9"  (1.753 m), weight 102.5 kg, SpO2 98 %. PHYSICAL EXAMINATION:  Physical Exam Constitutional:      General: She is not in acute distress.    Appearance: She is obese. She is not ill-appearing.  HENT:     Head: Normocephalic.     Mouth/Throat:     Mouth: Mucous membranes are moist.  Eyes:     General: No scleral icterus.    Conjunctiva/sclera: Conjunctivae normal.     Pupils: Pupils are equal, round, and reactive to light.  Neck:     Musculoskeletal: Normal range of motion and neck supple.     Vascular: No JVD.     Trachea: No tracheal deviation.  Cardiovascular:     Rate and Rhythm: Normal rate and regular rhythm.     Heart sounds: Normal heart sounds. No murmur. No gallop.   Pulmonary:     Effort: Pulmonary effort is normal. No respiratory distress.     Breath sounds: No stridor. No wheezing, rhonchi or rales.     Comments: Crackles Abdominal:     General: Bowel sounds are normal. There is no distension.     Palpations: Abdomen is soft.     Tenderness: There is no abdominal tenderness. There is no rebound.  Musculoskeletal: Normal range of motion.        General: No tenderness.  Skin:    Findings: No erythema or rash.  Neurological:     General: No focal deficit present.     Mental Status: She is alert and oriented to person, place, and time.     Cranial Nerves: No cranial nerve deficit.     Comments: Unable to exam.  Psychiatric:        Mood and Affect: Mood normal.     Comments: Drowsy and somnolent    LABORATORY PANEL:  Female CBC Recent Labs  Lab 12/27/18  0341  WBC 8.9  HGB 11.9*  HCT 39.7  PLT 163   ------------------------------------------------------------------------------------------------------------------ Chemistries  Recent Labs  Lab 12/26/18 0442 12/27/18 0341  NA 136 134*  K 3.0* 4.0  CL 100 101  CO2 29 28  GLUCOSE 176* 279*  BUN 36* 27*  CREATININE 1.06* 0.77  CALCIUM 7.7* 8.0*  MG 2.0  --    RADIOLOGY:  Dg Chest Port 1 View  Result Date: 12/26/2018 CLINICAL DATA:  Evaluate for pneumonia. EXAM: PORTABLE CHEST 1 VIEW COMPARISON:  12/25/2018 FINDINGS: There is a left chest wall pacer device with lead in the right atrial appendage and right ventricle. Stable cardiac enlargement.  Bilateral pleural effusions and pulmonary edema noted compatible with CHF. Asymmetric opacity within the right upper lobe is unchanged from previous exam. IMPRESSION: 1. Congestive heart failure. 2. No change in lateral opacity within the right upper lobe compatible with pneumonia. Electronically Signed   By: Kerby Moors M.D.   On: 12/26/2018 16:53   ASSESSMENT AND PLAN:    *Acute on chronic respiratory failure with hypoxia due to sepsis secondary to healthcare acquired pneumonia and UTI. Continue oxygen by nasal cannula, continue nebulizer.  BiPAP at night. Continue meropenem and discontinue vancomycin.  Leukocytosis improved.  MRSA screen is negative  Somnolence due to above.  Improved.  Lactic acidosis due to above.  Improved with treatment.  Hypokalemia.  Improved with potassium supplement.  *COPD with chronic respiratory failure.  Home oxygen 2 L.  Continue home medication.  *Hyperglycemia due to diabetes mellitus.    Continue sliding scale insulin.  Start Lantus.  *Paroxysmal atrial fibrillation and S/P mechanical aortic valve replacement. .  Continue Coumadin.  Metoprolol held due to hypotension.  Chronic diastolic CHF, LV EF: 33% -   65%. Hold diuretics due to soft blood pressure.  Diabetes.  Sliding scale. Obesity.  Diet control and follow-up PCP. Generalized weakness..  Ambulate the patient.  The patient walks independently at home. All the records are reviewed and case discussed with Care Management/Social Worker. Management plans discussed with the patient, family and they are in agreement.  CODE STATUS: Full Code  TOTAL TIME TAKING CARE OF THIS PATIENT: 27 minutes.   More than 50% of the time was spent in counseling/coordination of care: YES  POSSIBLE D/C IN 2 DAYS, DEPENDING ON CLINICAL CONDITION.   Demetrios Loll M.D on 12/27/2018 at 2:11 PM  Between 7am to 6pm - Pager - [phone]  After 6pm go to [email or web address] - Research scientist (physical sciences) Hospitalists

## 2018-12-27 NOTE — Progress Notes (Signed)
ANTICOAGULATION CONSULT NOTE   Pharmacy Consult for  Warfarin  Indication: mechanical heart valve   Allergies  Allergen Reactions  . Flecainide Shortness Of Breath and Other (See Comments)    Reaction: dizziness   . Amiodarone Other (See Comments)    Pt states that this medication causes lung bleeding.      Patient Measurements: Height: 5\' 9"  (175.3 cm) Weight: 226 lb (102.5 kg) IBW/kg (Calculated) : 66.2 Heparin Dosing Weight:   Vital Signs: Temp: 97.5 F (36.4 C) (01/05 1347) Temp Source: Oral (01/05 1347) BP: 123/70 (01/05 1347) Pulse Rate: 84 (01/05 1347)  Labs: Recent Labs    12/25/18 1850 12/26/18 0442 12/27/18 0341 12/27/18 1425  HGB 13.6 12.9 11.9*  --   HCT 42.3 42.1 39.7  --   PLT 220 163 163  --   LABPROT 30.0* 31.2*  --  27.2*  INR 2.91 3.06  --  2.57  CREATININE 1.06* 1.06* 0.77  --   TROPONINI 0.09*  --   --   --     Estimated Creatinine Clearance: 81 mL/min (by C-G formula based on SCr of 0.77 mg/dL).   Medical History: Past Medical History:  Diagnosis Date  . Acute diastolic heart failure ([place])   . Allergy   . 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 Asthma   . Atrial fibrillation ([place])   . Backache, unspecified   . Cancer ([place])    skin  . Cardiomegaly   . COPD (chronic obstructive pulmonary disease) ([place])   . Diabetes mellitus without complication ([place])   . 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 Esophageal reflux   . Headache(784.0)   . Herpes zoster without mention of complication   . Hx: UTI (urinary tract infection)   . Hypertension    heart controlled w CHF  . Nontoxic uninodular goiter   . Obesity, unspecified   . Osteoarthrosis, unspecified whether generalized or localized, unspecified site   . Unspecified sleep apnea   . Urine incontinence    hx of    Medications:  Medications Prior to Admission  Medication Sig Dispense Refill Last Dose  . acetaminophen (TYLENOL) 325 MG tablet Take 2 tablets  (650 mg total) by mouth every 6 (six) hours as needed for mild pain (or Fever >/= 101). 30 tablet 0 prn at prn  . ferrous sulfate 325 (65 FE) MG EC tablet Take 325 mg by mouth daily.   11 12/25/2018 at 1400  . gabapentin (NEURONTIN) 600 MG tablet Take 300-600 mg by mouth 3 (three) times daily. Take 2 capsules (600mg ) in morning, 1 capsule (300mg ) at midday, and 2 capsules (600mg ) at bedtime   12/25/2018 at 1400  . levocetirizine (XYZAL) 5 MG tablet Take 5 mg by mouth at bedtime.   12/24/2018 at 2100  . metFORMIN (GLUCOPHAGE) 1000 MG tablet Take 500 mg by mouth 2 (two) times daily with a meal.    12/25/2018 at 1400  . metoprolol tartrate (LOPRESSOR) 25 MG tablet Take 25 mg by mouth 2 (two) times daily.   12/25/2018 at 1400  . pantoprazole (PROTONIX) 40 MG tablet Take 40 mg by mouth daily.   12/25/2018 at 1400  . potassium chloride (MICRO-K) 10 MEQ CR capsule Take 20 mEq by mouth 2 (two) times daily.   12/25/2018 at 1400  . ranitidine (ZANTAC) 150 MG tablet Take 150 mg by mouth 2 (two) times daily.   12/25/2018 at 1400  . rOPINIRole (REQUIP) 1 MG tablet Take  1 mg by mouth at bedtime.   12/25/2018 at 1400  . spironolactone (ALDACTONE) 25 MG tablet Take 12.5 mg by mouth daily.   12/25/2018 at 1400  . torsemide (DEMADEX) 20 MG tablet Take 80 mg by mouth 2 (two) times daily.   12/25/2018 at 1400  . traZODone (DESYREL) 50 MG tablet Take 50 mg by mouth at bedtime.    12/24/2018 at 2100  . venlafaxine XR (EFFEXOR-XR) 150 MG 24 hr capsule Take 150 mg by mouth daily.   12/25/2018 at 1400  . Vitamin D, Ergocalciferol, (DRISDOL) 50000 units CAPS capsule Take 50,000 Units by mouth once a week.   Past Week at Unknown time  . warfarin (COUMADIN) 3 MG tablet Take 3 mg by mouth daily. On Saturday and Sunday   Past Week at Unknown time  . warfarin (COUMADIN) 4 MG tablet Take 8 mg by mouth daily. Monday - Friday   12/25/2018 at 1400  . metolazone (ZAROXOLYN) 2.5 MG tablet Take 2.5 mg by mouth 2 (two) times a week. Take 2.5 mg by mouth on Monday  and Thursday.   Not Taking at Unknown time    Assessment: Pharmacy consulted to dose warfarin for mechanical heart valve in this 73 year old female. 1/3 :  INR @ 18:50 = 2.91   Home warfarin dose :   Warfarin 8 mg PO Q M - F.                                         Warfarin 3 mg PO Sat Sun   Last dose given on 1/3 @ 14:00.  Goal of Therapy:  INR : 2.5 - 3.5  Monitor platelets by anticoagulation protocol: Yes   Plan:  Will continue pt home dose and check INR daily since pt is on abx.   Warfarin 3 mg PO Q Sat and Sun to continue on 1/4.  Warfarin 8 mg PO Q M - F to continue on 1/6.   , L 12/27/2018,3:49 PM

## 2018-12-27 NOTE — Progress Notes (Addendum)
Follow up - Critical Care Medicine Note  Patient Details:    Betty [name] is an 73 y.o. female.  With past medical history complicated to include vasculitis, COPD, mechanical valve on Coumadin, diastolic heart failure, chronic respiratory failure, recent admission to the hospital for Klebsiella UTI and bacteremia returns to the emergency room with confusion, weakness and shortness of breath x1 day.  Patient was noted to have right-sided pneumonia and hypotension, was subsequently admitted to the intensive care unit.  Lines, Airways, Drains: External Urinary Catheter (Active)  Collection Container Dedicated Suction Canister 12/27/2018  2:00 AM  Output (mL) 200 mL 12/26/2018  2:00 PM    Anti-infectives:  Anti-infectives (From admission, onward)   Start     Dose/Rate Route Frequency Ordered Stop   12/26/18 0400  meropenem (MERREM) 1 g in sodium chloride 0.9 % 100 mL IVPB     1 g 200 mL/hr over 30 Minutes Intravenous Every 8 hours 12/26/18 0039     12/26/18 0300  vancomycin (VANCOCIN) 1,250 mg in sodium chloride 0.9 % 250 mL IVPB     1,250 mg 166.7 mL/hr over 90 Minutes Intravenous Every 18 hours 12/25/18 2222     12/25/18 2015  vancomycin (VANCOCIN) IVPB 1000 mg/200 mL premix     1,000 mg 200 mL/hr over 60 Minutes Intravenous  Once 12/25/18 2015 12/25/18 2124   12/25/18 1930  meropenem (MERREM) 1 g in sodium chloride 0.9 % 100 mL IVPB     1 g 200 mL/hr over 30 Minutes Intravenous  Once 12/25/18 1924 12/25/18 2012      Microbiology: Results for orders placed or performed during the hospital encounter of 12/25/18  Blood Culture (routine x 2)     Status: None (Preliminary result)   Collection Time: 12/25/18  7:17 PM  Result Value Ref Range Status   Specimen Description BLOOD BLOOD RIGHT HAND  Final   Special Requests   Final    BOTTLES DRAWN AEROBIC AND ANAEROBIC Blood Culture adequate volume   Culture   Final    NO GROWTH 2 DAYS Performed at [facility], [address] [place], [place] [address]    Report Status PENDING  Incomplete  Blood Culture (routine x 2)     Status: None (Preliminary result)   Collection Time: 12/25/18  7:17 PM  Result Value Ref Range Status   Specimen Description BLOOD BLOOD LEFT HAND  Final   Special Requests   Final    BOTTLES DRAWN AEROBIC AND ANAEROBIC Blood Culture results may not be optimal due to an inadequate volume of blood received in culture bottles   Culture   Final    NO GROWTH 2 DAYS Performed at [facility], [address], [name], [place] [address]    Report Status PENDING  Incomplete  MRSA PCR Screening     Status: None   Collection Time: 12/25/18 10:21 PM  Result Value Ref Range Status   MRSA by PCR NEGATIVE NEGATIVE Final    Comment:        The GeneXpert MRSA Assay (FDA approved for NASAL specimens only), is one component of a comprehensive MRSA colonization surveillance program. It is not intended to diagnose MRSA infection nor to guide or monitor treatment for MRSA infections. Performed at [facility], [address] [place], [place] [address]    Studies: Dg Chest 2 View  Result Date: 12/25/2018 CLINICAL DATA:  Fever. Confusion and lethargy. EXAM: CHEST - 2 VIEW COMPARISON:  Radiographs 10/26/2018  FINDINGS: Post median sternotomy prosthetic aortic valve. 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 Mild cardiomegaly with unchanged mediastinal contours and aortic atherosclerosis. Small right and possibly left pleural effusions, not significantly changed from prior exam. Interstitial opacities with slight progression from prior. More focal opacity in the periphery of the right upper lobe is new. No pneumothorax. No acute osseous abnormalities are seen. IMPRESSION: 1. Focal opacity in the periphery of the right upper lobe suspicious for pneumonia in the setting of fever. Pleural fluid layering along the fissures also considered but felt less likely. 2. Interstitial opacities with slight progression  from prior, may be pulmonary edema or atypical infection. 3. Small right and possibly left pleural effusions, not significantly changed. Electronically Signed   By: Keith Rake M.D.   On: 12/25/2018 19:38   Dg Chest Port 1 View  Result Date: 12/26/2018 CLINICAL DATA:  Evaluate for pneumonia. EXAM: PORTABLE CHEST 1 VIEW COMPARISON:  12/25/2018 FINDINGS: There is a left chest wall pacer device with lead in the right atrial appendage and right ventricle. Stable cardiac enlargement. Bilateral pleural effusions and pulmonary edema noted compatible with CHF. Asymmetric opacity within the right upper lobe is unchanged from previous exam. IMPRESSION: 1. Congestive heart failure. 2. No change in lateral opacity within the right upper lobe compatible with pneumonia. Electronically Signed   By: Kerby Moors M.D.   On: 12/26/2018 16:53    Consults:    Subjective:    Overnight Issues: Patient used BiPAP yesterday.  This morning she states that her breathing is much better.  Significant improvement in her congestion and shortness of breath  Objective:  Vital signs for last 24 hours: Temp:  [98 F (36.7 C)-99.3 F (37.4 C)] 98 F (36.7 C) (01/05 0800) Pulse Rate:  [78-89] 86 (01/05 0800) Resp:  [15-31] 15 (01/05 0800) BP: (108-133)/(53-102) 112/53 (01/05 0500) SpO2:  [92 %-100 %] 95 % (01/05 0800) FiO2 (%):  [28 %] 28 % (01/05 0000)  Hemodynamic parameters for last 24 hours:    Intake/Output from previous day: 01/04 0701 - 01/05 0700 In: 478 [I.V.:3; IV Piggyback:475] Out: 701 [Urine:700; Stool:1]  Intake/Output this shift: Total I/O In: 613.1 [IV Piggyback:613.1] Out: -   Vent settings for last 24 hours: FiO2 (%):  [28 %] 28 %  Physical Exam:  GENERAL: She is awake and alert, communicating this morning HEAD: Normocephalic, atraumatic.  EYES: Pupils equal, round, reactive to light. No scleral icterus.  MOUTH: Moist mucosal membrane. NECK: Supple. No JVD.  PULMONARY: Right  anterior chest wall crackles appreciated on exam CARDIOVASCULAR: S1 and S2. Regular rate and rhythm. No murmurs, rubs, or gallops.  GASTROINTESTINAL: Soft, nontender, -distended. No masses. Positive bowel sounds. No hepatosplenomegaly.  MUSCULOSKELETAL: No swelling, clubbing, or edema.  NEUROLOGIC:lethargic SKIN:intact,warm,dry   Assessment/Plan:  Pneumonia.  Chest x-ray is consistent with right-sided pneumonia.    Is on vancomycin and meropenem.  MRSA screen was negative, will stop vancomycin and continue meropenem,  she has clinically improved today.  Patient uses BiPAP at home.  She will continue on BiPAP at night and during naps.  UTI.  Patient has many bacteria and large leukocytes and WBCs.  Recently was treated for Klebsiella UTI.  Mechanical aortic valve replacement.  on Coumadin, last INR was 3  Restless leg syndrome.  On ropinirole  Diabetes mellitus.  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  Stable for floor transfer  Jasreet Dickie 12/27/2018  *Care during the described time interval was provided by me and/or other providers on the critical care team.  I have reviewed this patient's available data, including medical history, events of note, physical examination and test results as part of my evaluation.

## 2018-12-27 NOTE — Plan of Care (Signed)
Bipap last night, pt request off around 4am this morning. VSS. Prn pain medication given, pt stated relief with medication. Pt on [place] at 3L now. Rested well last night.

## 2018-12-27 NOTE — Progress Notes (Signed)
Patient transferred from CCU, report received from [facility] Pt Oriented to room, call bell, and staff. Bed in lowest position. Fall safety plan reviewed. . Telemetry box verification with tele clerk- Box#:[id number]. Upon arrival to unit Pt complaining of 6/10 pain on feet, pt states "feels like I am stepping on needles". Dr. Bridgett Larsson txt paged. Awaiting call back.

## 2018-12-27 NOTE — Progress Notes (Signed)
Advanced Care Plan.  Purpose of Encounter: CODE STATUS. Parties in Attendance: The patient and me. Patient's Decisional Capacity: Yes. Medical Story: Betty [name]  is a 73 y.o. female with a known history of chronic respiratory failure, mechanical valve on Coumadin,  A. fib, diastolic CHF, recurrent infections, chronic respiratory failure, recent admission for Klebsiella UTI and bacteremia.  The patient is admitted for acute on chronic respiratory failure with hypoxia due to sepsis secondary to healthcare acquired pneumonia and UTI.  She was admitted to stepdown unit and put on BiPAP.  She is off BiPAP and on oxygen by nasal cannula.  She is alert, awake and oriented.  I discussed with the patient about her current condition, prognosis and CODE STATUS.  She said she cannot decide that she wants to be resuscitated or intubated if she has cardiopulmonary arrest.  She agrees to be on full code at this time. Plan:  Code Status: Full code. Time spent discussing advance care planning: 17 minutes.

## 2018-12-28 LAB — GLUCOSE, CAPILLARY
Glucose-Capillary: 127 mg/dL — ABNORMAL HIGH (ref 70–99)
Glucose-Capillary: 136 mg/dL — ABNORMAL HIGH (ref 70–99)
Glucose-Capillary: 172 mg/dL — ABNORMAL HIGH (ref 70–99)
Glucose-Capillary: 157 mg/dL — ABNORMAL HIGH (ref 70–99)

## 2018-12-28 LAB — PROTIME-INR
INR: 2.4
PROTHROMBIN TIME: 25.8 s — AB (ref 11.4–15.2)

## 2018-12-28 MED ORDER — GABAPENTIN 600 MG PO TABS
300.0000 mg | ORAL_TABLET | Freq: Every day | ORAL | Status: DC
  Administered 2018-12-28: 300 mg via ORAL
  Filled 2018-12-28: qty 1

## 2018-12-28 MED ORDER — CEFDINIR 300 MG PO CAPS
300.0000 mg | ORAL_CAPSULE | Freq: Two times a day (BID) | ORAL | Status: DC
  Administered 2018-12-28 – 2018-12-29 (×3): 300 mg via ORAL
  Filled 2018-12-28 (×4): qty 1

## 2018-12-28 MED ORDER — GABAPENTIN 600 MG PO TABS
600.0000 mg | ORAL_TABLET | ORAL | Status: DC
  Administered 2018-12-28 – 2018-12-29 (×3): 600 mg via ORAL
  Filled 2018-12-28 (×3): qty 1

## 2018-12-28 MED ORDER — METOPROLOL TARTRATE 25 MG PO TABS
12.5000 mg | ORAL_TABLET | Freq: Two times a day (BID) | ORAL | Status: DC
  Administered 2018-12-28 – 2018-12-29 (×2): 12.5 mg via ORAL
  Filled 2018-12-28 (×2): qty 1

## 2018-12-28 NOTE — Progress Notes (Signed)
Patient is unhappy about being placed on isolation.  She thinks people look at her funny.  She told me she didn't want the isolation bag on her door.  I explained that it necessary and was a precautionary measure.  I told her being on isolation was common in the hospital.

## 2018-12-28 NOTE — Progress Notes (Signed)
[facility] NAME: Betty [name]    MR#:  [id number]  DATE OF BIRTH:  October 10, 1946  SUBJECTIVE:  CHIEF COMPLAINT:   Chief Complaint  Patient presents with  . Fatigue   Patient feels better, is on oxygen 3 L by nasal cannula.   REVIEW OF SYSTEMS:  Review of Systems  Constitutional: Positive for malaise/fatigue. Negative for chills and fever.  HENT: Negative for sore throat.   Eyes: Negative for blurred vision and double vision.  Respiratory: Positive for shortness of breath. Negative for cough, hemoptysis, sputum production, wheezing and stridor.   Cardiovascular: Negative for chest pain, palpitations, orthopnea and leg swelling.  Gastrointestinal: Negative for abdominal pain, blood in stool, diarrhea, melena, nausea and vomiting.  Genitourinary: Negative for dysuria, flank pain and hematuria.  Musculoskeletal: Negative for back pain and joint pain.  Skin: Negative for rash.  Neurological: Negative for dizziness, sensory change, focal weakness, seizures, loss of consciousness, weakness and headaches.  Endo/Heme/Allergies: Negative for polydipsia.  Psychiatric/Behavioral: Negative for depression. The patient is not nervous/anxious.     DRUG ALLERGIES:   Allergies  Allergen Reactions  . Flecainide Shortness Of Breath and Other (See Comments)    Reaction: dizziness   . Amiodarone Other (See Comments)    Pt states that this medication causes lung bleeding.     VITALS:  Blood pressure 126/77, pulse 87, temperature 98.6 F (37 C), temperature source Oral, resp. rate 20, height 5\' 9"  (1.753 m), weight 104 kg, SpO2 92 %. PHYSICAL EXAMINATION:  Physical Exam Constitutional:      General: She is not in acute distress.    Appearance: She is obese. She is not ill-appearing.  HENT:     Head: Normocephalic.     Mouth/Throat:     Mouth: Mucous membranes are moist.  Eyes:     General: No scleral icterus.    Conjunctiva/sclera:  Conjunctivae normal.     Pupils: Pupils are equal, round, and reactive to light.  Neck:     Musculoskeletal: Normal range of motion and neck supple.     Vascular: No JVD.     Trachea: No tracheal deviation.  Cardiovascular:     Rate and Rhythm: Normal rate and regular rhythm.     Heart sounds: Normal heart sounds. No murmur. No gallop.   Pulmonary:     Effort: Pulmonary effort is normal. No respiratory distress.     Breath sounds: No stridor. No wheezing or rhonchi.     Comments: Crackles Abdominal:     General: Bowel sounds are normal. There is no distension.     Palpations: Abdomen is soft.     Tenderness: There is no abdominal tenderness. There is no rebound.  Musculoskeletal: Normal range of motion.        General: No tenderness.     Right lower leg: No edema.     Left lower leg: No edema.  Skin:    Findings: No erythema or rash.  Neurological:     General: No focal deficit present.     Mental Status: She is alert and oriented to person, place, and time.     Cranial Nerves: No cranial nerve deficit.  Psychiatric:        Mood and Affect: Mood normal.    LABORATORY PANEL:  Female CBC Recent Labs  Lab 12/27/18 0341  WBC 8.9  HGB 11.9*  HCT 39.7  PLT 163   ------------------------------------------------------------------------------------------------------------------ Chemistries  Recent Labs  Lab 12/26/18 0442 12/27/18 0341  NA 136 134*  K 3.0* 4.0  CL 100 101  CO2 29 28  GLUCOSE 176* 279*  BUN 36* 27*  CREATININE 1.06* 0.77  CALCIUM 7.7* 8.0*  MG 2.0  --    RADIOLOGY:  No results found. ASSESSMENT AND PLAN:    *Acute on chronic respiratory failure with hypoxia due to sepsis secondary to healthcare acquired pneumonia and UTI. Continue oxygen by nasal cannula, continue nebulizer.  BiPAP at night. She has been treated with meropenem.  Vancomycin was discontinued.  Leukocytosis improved.  MRSA screen is negative Changed to cefdinir p.o. for 3  days.  Somnolence due to above.  Improved.  Lactic acidosis due to above.  Improved with treatment.  Hypokalemia.  Improved with potassium supplement.  *COPD with chronic respiratory failure.  Home oxygen 2 L.  Continue home medication.  *Hyperglycemia due to diabetes mellitus.    Continue sliding scale insulin and Lantus.  *Paroxysmal atrial fibrillation and S/P mechanical aortic valve replacement. .  Continue Coumadin.  Metoprolol held due to hypotension.  Chronic diastolic CHF, LV EF: 42% -   65%. Hold diuretics due to soft blood pressure.  Obesity.  Diet control and follow-up PCP. Generalized weakness..  Ambulate the patient.  The patient walks independently at home. All the records are reviewed and case discussed with Care Management/Social Worker. Management plans discussed with the patient, family and they are in agreement.  CODE STATUS: Full Code  TOTAL TIME TAKING CARE OF THIS PATIENT: 26 minutes.   More than 50% of the time was spent in counseling/coordination of care: YES  POSSIBLE D/C IN 2 DAYS, DEPENDING ON CLINICAL CONDITION.   Demetrios Loll M.D on 12/28/2018 at 4:09 PM  Between 7am to 6pm - Pager - [phone]  After 6pm go to [email or web address] - [facility]

## 2018-12-28 NOTE — Progress Notes (Signed)
ANTICOAGULATION CONSULT NOTE   Pharmacy Consult for  Warfarin  Indication: mechanical heart valve   Allergies  Allergen Reactions  . Flecainide Shortness Of Breath and Other (See Comments)    Reaction: dizziness   . Amiodarone Other (See Comments)    Pt states that this medication causes lung bleeding.      Patient Measurements: Height: 5\' 9"  (175.3 cm) Weight: 229 lb 3.2 oz (104 kg) IBW/kg (Calculated) : 66.2 Heparin Dosing Weight:   Vital Signs: Temp: 98.8 F (37.1 C) (01/06 0921) Temp Source: Oral (01/06 0921) BP: 135/80 (01/06 0759) Pulse Rate: 96 (01/06 0759)  Labs: Recent Labs    12/25/18 1850 12/26/18 0442 12/27/18 0341 12/27/18 1425 12/28/18 0635  HGB 13.6 12.9 11.9*  --   --   HCT 42.3 42.1 39.7  --   --   PLT 220 163 163  --   --   LABPROT 30.0* 31.2*  --  27.2* 25.8*  INR 2.91 3.06  --  2.57 2.40  CREATININE 1.06* 1.06* 0.77  --   --   TROPONINI 0.09*  --   --   --   --     Estimated Creatinine Clearance: 81.6 mL/min (by C-G formula based on SCr of 0.77 mg/dL).   Medical History: Past Medical History:  Diagnosis Date  . Acute diastolic heart failure ([place])   . Allergy   . 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 Asthma   . Atrial fibrillation ([place])   . Backache, unspecified   . Cancer ([place])    skin  . Cardiomegaly   . COPD (chronic obstructive pulmonary disease) ([place])   . Diabetes mellitus without complication ([place])   . 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 Esophageal reflux   . Headache(784.0)   . Herpes zoster without mention of complication   . Hx: UTI (urinary tract infection)   . Hypertension    heart controlled w CHF  . Nontoxic uninodular goiter   . Obesity, unspecified   . Osteoarthrosis, unspecified whether generalized or localized, unspecified site   . Unspecified sleep apnea   . Urine incontinence    hx of    Medications:  Medications Prior to Admission  Medication Sig Dispense Refill Last Dose   . acetaminophen (TYLENOL) 325 MG tablet Take 2 tablets (650 mg total) by mouth every 6 (six) hours as needed for mild pain (or Fever >/= 101). 30 tablet 0 prn at prn  . ferrous sulfate 325 (65 FE) MG EC tablet Take 325 mg by mouth daily.   11 12/25/2018 at 1400  . gabapentin (NEURONTIN) 600 MG tablet Take 300-600 mg by mouth 3 (three) times daily. Take 2 capsules (600mg ) in morning, 1 capsule (300mg ) at midday, and 2 capsules (600mg ) at bedtime   12/25/2018 at 1400  . levocetirizine (XYZAL) 5 MG tablet Take 5 mg by mouth at bedtime.   12/24/2018 at 2100  . metFORMIN (GLUCOPHAGE) 1000 MG tablet Take 500 mg by mouth 2 (two) times daily with a meal.    12/25/2018 at 1400  . metoprolol tartrate (LOPRESSOR) 25 MG tablet Take 25 mg by mouth 2 (two) times daily.   12/25/2018 at 1400  . pantoprazole (PROTONIX) 40 MG tablet Take 40 mg by mouth daily.   12/25/2018 at 1400  . potassium chloride (MICRO-K) 10 MEQ CR capsule Take 20 mEq by mouth 2 (two) times daily.   12/25/2018 at 1400  . ranitidine (ZANTAC) 150 MG tablet Take  150 mg by mouth 2 (two) times daily.   12/25/2018 at 1400  . rOPINIRole (REQUIP) 1 MG tablet Take 1 mg by mouth at bedtime.   12/25/2018 at 1400  . spironolactone (ALDACTONE) 25 MG tablet Take 12.5 mg by mouth daily.   12/25/2018 at 1400  . torsemide (DEMADEX) 20 MG tablet Take 80 mg by mouth 2 (two) times daily.   12/25/2018 at 1400  . traZODone (DESYREL) 50 MG tablet Take 50 mg by mouth at bedtime.    12/24/2018 at 2100  . venlafaxine XR (EFFEXOR-XR) 150 MG 24 hr capsule Take 150 mg by mouth daily.   12/25/2018 at 1400  . Vitamin D, Ergocalciferol, (DRISDOL) 50000 units CAPS capsule Take 50,000 Units by mouth once a week.   Past Week at Unknown time  . warfarin (COUMADIN) 3 MG tablet Take 3 mg by mouth daily. On Saturday and Sunday   Past Week at Unknown time  . warfarin (COUMADIN) 4 MG tablet Take 8 mg by mouth daily. Monday - Friday   12/25/2018 at 1400  . metolazone (ZAROXOLYN) 2.5 MG tablet Take 2.5 mg by  mouth 2 (two) times a week. Take 2.5 mg by mouth on Monday and Thursday.   Not Taking at Unknown time    Assessment: Pharmacy consulted to dose warfarin for mechanical heart valve in this 73 year old female. 1/3 :  INR @ 18:50 = 2.91   Home warfarin dose :    Warfarin 8 mg PO Q M - F. Warfarin 3 mg PO Sat-Sun   Goal of Therapy:  INR : 2.5 - 3.5  Monitor platelets by anticoagulation protocol: Yes   Plan:  INR is therapeutic at 2.40. Will continue pt home dose and check INR daily since pt is on abx.   Paulina Fusi, PharmD, BCPS 12/28/2018 9:37 AM

## 2018-12-29 LAB — GLUCOSE, CAPILLARY
Glucose-Capillary: 125 mg/dL — ABNORMAL HIGH (ref 70–99)
Glucose-Capillary: 136 mg/dL — ABNORMAL HIGH (ref 70–99)

## 2018-12-29 LAB — PROTIME-INR
INR: 2.63
Prothrombin Time: 27.7 seconds — ABNORMAL HIGH (ref 11.4–15.2)

## 2018-12-29 MED ORDER — CEFDINIR 300 MG PO CAPS: 300.0000 mg | ORAL_CAPSULE | Freq: Two times a day (BID) | ORAL | 0 refills | Status: DC

## 2018-12-29 MED ORDER — GUAIFENESIN-DM 100-10 MG/5ML PO SYRP: 5.0000 mL | ORAL_SOLUTION | ORAL | 0 refills | Status: DC | PRN

## 2018-12-29 NOTE — Progress Notes (Signed)
Pt ambulated around nurses station on 2 L O2 her baseline, no complaints, mild weakness, she is ready for discharge after lunch today.

## 2018-12-29 NOTE — Discharge Summary (Addendum)
[facility] NAME: Betty [name]    MR#:  [id number]  DATE OF BIRTH:  17-Jun-1946  DATE OF ADMISSION:  12/25/2018   ADMITTING PHYSICIAN: Hillary Bow, MD  DATE OF DISCHARGE: 12/29/2018  PRIMARY CARE PHYSICIAN: Dion Body, MD   ADMISSION DIAGNOSIS:  Lung infiltrate [R91.8] Urinary tract infection, acute [N39.0] Severe sepsis ([place]) [A41.9, R65.20] Hypotension, unspecified hypotension type [I95.9] DISCHARGE DIAGNOSIS:  Active Problems:   Severe sepsis ([place])  SECONDARY DIAGNOSIS:   Past Medical History:  Diagnosis Date  . Acute diastolic heart failure ([place])   . Allergy   . 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 Asthma   . Atrial fibrillation ([place])   . Backache, unspecified   . Cancer ([place])    skin  . Cardiomegaly   . COPD (chronic obstructive pulmonary disease) ([place])   . Diabetes mellitus without complication ([place])   . 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 Esophageal reflux   . Headache(784.0)   . Herpes zoster without mention of complication   . Hx: UTI (urinary tract infection)   . Hypertension    heart controlled w CHF  . Nontoxic uninodular goiter   . Obesity, unspecified   . Osteoarthrosis, unspecified whether generalized or localized, unspecified site   . Unspecified sleep apnea   . Urine incontinence    hx of   HOSPITAL COURSE:   *Acute on chronic respiratory failure with hypoxia due to sepsis secondary to healthcare acquired pneumonia and UTI. Continue home oxygen by nasal cannula, continue nebulizer.  BiPAP at night. She has been treated with meropenem.  Vancomycin was discontinued.  Leukocytosis improved.  MRSA screen is negative Changed to cefdinir p.o. for 3 days.  Somnolence due to above.  Improved.  Lactic acidosis due to above.  Improved with treatment.  Hypokalemia.  Improved with potassium supplement.  *COPD with chronic respiratory failure. Home  oxygen 2 L.  Continue home medication.  *Hyperglycemia due to diabetes mellitus.   Continue sliding scale insulin and Lantus.  Resume home medication after discharge.  *Paroxysmal atrial fibrillation and S/P mechanical aortic valve replacement. . ContinueCoumadin. Metoprolol held due to hypotension.  Resume Lopressor.  Chronic diastolic CHF, LV EF: 45% - 65%. Hold diuretics due to soft blood pressure and dehydration.  Follow-up PCP to resume.  Obesity.  Diet control and follow-up PCP. Generalized weakness..  Ambulate the patient.  The patient walks independently at home. She needs HHPT this time.  DISCHARGE CONDITIONS:  Stable, discharge to home with HHPT today. CONSULTS OBTAINED:   DRUG ALLERGIES:   Allergies  Allergen Reactions  . Flecainide Shortness Of Breath and Other (See Comments)    Reaction: dizziness   . Amiodarone Other (See Comments)    Pt states that this medication causes lung bleeding.     DISCHARGE MEDICATIONS:   Allergies as of 12/29/2018      Reactions   Flecainide Shortness Of Breath, Other (See Comments)   Reaction: dizziness    Amiodarone Other (See Comments)   Pt states that this medication causes lung bleeding.        Medication List    STOP taking these medications   metolazone 2.5 MG tablet Commonly known as:  ZAROXOLYN   spironolactone 25 MG tablet Commonly known as:  ALDACTONE   torsemide 20 MG tablet Commonly known as:  DEMADEX     TAKE these medications   acetaminophen 325 MG tablet Commonly known  as:  TYLENOL Take 2 tablets (650 mg total) by mouth every 6 (six) hours as needed for mild pain (or Fever >/= 101).   cefdinir 300 MG capsule Commonly known as:  OMNICEF Take 1 capsule (300 mg total) by mouth every 12 (twelve) hours.   ferrous sulfate 325 (65 FE) MG EC tablet Take 325 mg by mouth daily.   gabapentin 600 MG tablet Commonly known as:  NEURONTIN Take 300-600 mg by mouth 3 (three) times daily. Take 2 capsules  (600mg ) in morning, 1 capsule (300mg ) at midday, and 2 capsules (600mg ) at bedtime   guaiFENesin-dextromethorphan 100-10 MG/5ML syrup Commonly known as:  ROBITUSSIN DM Take 5 mLs by mouth every 4 (four) hours as needed for cough.   levocetirizine 5 MG tablet Commonly known as:  XYZAL Take 5 mg by mouth at bedtime.   metFORMIN 1000 MG tablet Commonly known as:  GLUCOPHAGE Take 500 mg by mouth 2 (two) times daily with a meal.   metoprolol tartrate 25 MG tablet Commonly known as:  LOPRESSOR Take 25 mg by mouth 2 (two) times daily.   pantoprazole 40 MG tablet Commonly known as:  PROTONIX Take 40 mg by mouth daily.   potassium chloride 10 MEQ CR capsule Commonly known as:  MICRO-K Take 20 mEq by mouth 2 (two) times daily.   ranitidine 150 MG tablet Commonly known as:  ZANTAC Take 150 mg by mouth 2 (two) times daily.   rOPINIRole 1 MG tablet Commonly known as:  REQUIP Take 1 mg by mouth at bedtime.   traZODone 50 MG tablet Commonly known as:  DESYREL Take 50 mg by mouth at bedtime.   venlafaxine XR 150 MG 24 hr capsule Commonly known as:  EFFEXOR-XR Take 150 mg by mouth daily.   Vitamin D (Ergocalciferol) 1.25 MG (50000 UT) Caps capsule Commonly known as:  DRISDOL Take 50,000 Units by mouth once a week.   warfarin 4 MG tablet Commonly known as:  COUMADIN Take 8 mg by mouth daily. Monday - Friday   warfarin 3 MG tablet Commonly known as:  COUMADIN Take 3 mg by mouth daily. On Saturday and Sunday        DISCHARGE INSTRUCTIONS:  See AVS. If you experience worsening of your admission symptoms, develop shortness of breath, life threatening emergency, suicidal or homicidal thoughts you must seek medical attention immediately by calling 911 or calling your MD immediately  if symptoms less severe.  You Must read complete instructions/literature along with all the possible adverse reactions/side effects for all the Medicines you take and that have been prescribed to  you. Take any new Medicines after you have completely understood and accpet all the possible adverse reactions/side effects.   Please note  You were cared for by a hospitalist during your hospital stay. If you have any questions about your discharge medications or the care you received while you were in the hospital after you are discharged, you can call the unit and asked to speak with the hospitalist on call if the hospitalist that took care of you is not available. Once you are discharged, your primary care physician will handle any further medical issues. Please note that NO REFILLS for any discharge medications will be authorized once you are discharged, as it is imperative that you return to your primary care physician (or establish a relationship with a primary care physician if you do not have one) for your aftercare needs so that they can reassess your need for medications and monitor  your lab values.    On the day of Discharge:  VITAL SIGNS:  Blood pressure 123/66, pulse 83, temperature 98.4 F (36.9 C), temperature source Oral, resp. rate 20, height 5\' 9"  (1.753 m), weight 105.2 kg, SpO2 (!) 89 %. PHYSICAL EXAMINATION:  GENERAL:  73 y.o.-year-old patient lying in the bed with no acute distress.  Obesity. EYES: Pupils equal, round, reactive to light and accommodation. No scleral icterus. Extraocular muscles intact.  HEENT: Head atraumatic, normocephalic. Oropharynx and nasopharynx clear.  NECK:  Supple, no jugular venous distention. No thyroid enlargement, no tenderness.  LUNGS: Normal breath sounds bilaterally, no wheezing, rales,rhonchi or crepitation. No use of accessory muscles of respiration.  CARDIOVASCULAR: S1, S2 normal. No murmurs, rubs, or gallops.  ABDOMEN: Soft, non-tender, non-distended. Bowel sounds present. No organomegaly or mass.  EXTREMITIES: No pedal edema, cyanosis, or clubbing.  NEUROLOGIC: Cranial nerves II through XII are intact. Muscle strength 5/5 in all  extremities. Sensation intact. Gait not checked.  PSYCHIATRIC: The patient is alert and oriented x 3.  SKIN: No obvious rash, lesion, or ulcer.  DATA REVIEW:   CBC Recent Labs  Lab 12/27/18 0341  WBC 8.9  HGB 11.9*  HCT 39.7  PLT 163    Chemistries  Recent Labs  Lab 12/26/18 0442 12/27/18 0341  NA 136 134*  K 3.0* 4.0  CL 100 101  CO2 29 28  GLUCOSE 176* 279*  BUN 36* 27*  CREATININE 1.06* 0.77  CALCIUM 7.7* 8.0*  MG 2.0  --      Microbiology Results  Results for orders placed or performed during the hospital encounter of 12/25/18  Blood Culture (routine x 2)     Status: None (Preliminary result)   Collection Time: 12/25/18  7:17 PM  Result Value Ref Range Status   Specimen Description BLOOD BLOOD RIGHT HAND  Final   Special Requests   Final    BOTTLES DRAWN AEROBIC AND ANAEROBIC Blood Culture adequate volume   Culture   Final    NO GROWTH 4 DAYS Performed at [facility], [address], [place], [place] [address]    Report Status PENDING  Incomplete  Blood Culture (routine x 2)     Status: None (Preliminary result)   Collection Time: 12/25/18  7:17 PM  Result Value Ref Range Status   Specimen Description BLOOD BLOOD LEFT HAND  Final   Special Requests   Final    BOTTLES DRAWN AEROBIC AND ANAEROBIC Blood Culture results may not be optimal due to an inadequate volume of blood received in culture bottles   Culture   Final    NO GROWTH 4 DAYS Performed at [facility], [address] [place], [place] [address]    Report Status PENDING  Incomplete  MRSA PCR Screening     Status: None   Collection Time: 12/25/18 10:21 PM  Result Value Ref Range Status   MRSA by PCR NEGATIVE NEGATIVE Final    Comment:        The GeneXpert MRSA Assay (FDA approved for NASAL specimens only), is one component of a comprehensive MRSA colonization surveillance program. It is not intended to diagnose MRSA infection nor to guide or monitor treatment  for MRSA infections. Performed at [facility], [address] [place],  [address]     RADIOLOGY:  No results found.   Management plans discussed with the patient, family and they are in agreement.  CODE STATUS: Full Code   TOTAL TIME TAKING CARE OF  THIS PATIENT: 32 minutes.    Demetrios Loll M.D on 12/29/2018 at 12:27 PM  Between 7am to 6pm - Pager - [phone]  After 6pm go to [email or web address] - Proofreader  [facility]  Office  [phone]  CC: Primary care physician; Dion Body, MD   Note: This dictation was prepared with Dragon dictation along with smaller phrase technology. Any transcriptional errors that result from this process are unintentional.

## 2018-12-29 NOTE — Progress Notes (Signed)
ANTICOAGULATION CONSULT NOTE   Pharmacy Consult for  Warfarin  Indication: mechanical heart valve   Allergies  Allergen Reactions  . Flecainide Shortness Of Breath and Other (See Comments)    Reaction: dizziness   . Amiodarone Other (See Comments)    Pt states that this medication causes lung bleeding.      Patient Measurements: Height: 5\' 9"  (175.3 cm) Weight: 231 lb 14.4 oz (105.2 kg) IBW/kg (Calculated) : 66.2 Heparin Dosing Weight:   Vital Signs: Temp: 98.4 F (36.9 C) (01/07 0901) Temp Source: Oral (01/07 0901) BP: 123/66 (01/07 0901) Pulse Rate: 83 (01/07 0901)  Labs: Recent Labs    12/27/18 0341 12/27/18 1425 12/28/18 0635 12/29/18 0810  HGB 11.9*  --   --   --   HCT 39.7  --   --   --   PLT 163  --   --   --   LABPROT  --  27.2* 25.8* 27.7*  INR  --  2.57 2.40 2.63  CREATININE 0.77  --   --   --     Estimated Creatinine Clearance: 82.1 mL/min (by C-G formula based on SCr of 0.77 mg/dL).   Medical History: Past Medical History:  Diagnosis Date  . Acute diastolic heart failure ([place])   . Allergy   . 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 Asthma   . Atrial fibrillation ([place])   . Backache, unspecified   . Cancer ([place])    skin  . Cardiomegaly   . COPD (chronic obstructive pulmonary disease) ([place])   . Diabetes mellitus without complication ([place])   . 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 Esophageal reflux   . Headache(784.0)   . Herpes zoster without mention of complication   . Hx: UTI (urinary tract infection)   . Hypertension    heart controlled w CHF  . Nontoxic uninodular goiter   . Obesity, unspecified   . Osteoarthrosis, unspecified whether generalized or localized, unspecified site   . Unspecified sleep apnea   . Urine incontinence    hx of    Medications:  Medications Prior to Admission  Medication Sig Dispense Refill Last Dose  . acetaminophen (TYLENOL) 325 MG tablet Take 2 tablets (650 mg total) by mouth  every 6 (six) hours as needed for mild pain (or Fever >/= 101). 30 tablet 0 prn at prn  . ferrous sulfate 325 (65 FE) MG EC tablet Take 325 mg by mouth daily.   11 12/25/2018 at 1400  . gabapentin (NEURONTIN) 600 MG tablet Take 300-600 mg by mouth 3 (three) times daily. Take 2 capsules (600mg ) in morning, 1 capsule (300mg ) at midday, and 2 capsules (600mg ) at bedtime   12/25/2018 at 1400  . levocetirizine (XYZAL) 5 MG tablet Take 5 mg by mouth at bedtime.   12/24/2018 at 2100  . metFORMIN (GLUCOPHAGE) 1000 MG tablet Take 500 mg by mouth 2 (two) times daily with a meal.    12/25/2018 at 1400  . metoprolol tartrate (LOPRESSOR) 25 MG tablet Take 25 mg by mouth 2 (two) times daily.   12/25/2018 at 1400  . pantoprazole (PROTONIX) 40 MG tablet Take 40 mg by mouth daily.   12/25/2018 at 1400  . potassium chloride (MICRO-K) 10 MEQ CR capsule Take 20 mEq by mouth 2 (two) times daily.   12/25/2018 at 1400  . ranitidine (ZANTAC) 150 MG tablet Take 150 mg by mouth 2 (two) times daily.   12/25/2018 at 1400  .  rOPINIRole (REQUIP) 1 MG tablet Take 1 mg by mouth at bedtime.   12/25/2018 at 1400  . spironolactone (ALDACTONE) 25 MG tablet Take 12.5 mg by mouth daily.   12/25/2018 at 1400  . torsemide (DEMADEX) 20 MG tablet Take 80 mg by mouth 2 (two) times daily.   12/25/2018 at 1400  . traZODone (DESYREL) 50 MG tablet Take 50 mg by mouth at bedtime.    12/24/2018 at 2100  . venlafaxine XR (EFFEXOR-XR) 150 MG 24 hr capsule Take 150 mg by mouth daily.   12/25/2018 at 1400  . Vitamin D, Ergocalciferol, (DRISDOL) 50000 units CAPS capsule Take 50,000 Units by mouth once a week.   Past Week at Unknown time  . warfarin (COUMADIN) 3 MG tablet Take 3 mg by mouth daily. On Saturday and Sunday   Past Week at Unknown time  . warfarin (COUMADIN) 4 MG tablet Take 8 mg by mouth daily. Monday - Friday   12/25/2018 at 1400  . metolazone (ZAROXOLYN) 2.5 MG tablet Take 2.5 mg by mouth 2 (two) times a week. Take 2.5 mg by mouth on Monday and Thursday.   Not  Taking at Unknown time    Assessment: Pharmacy consulted to dose warfarin for mechanical heart valve in this 73 year old female. 1/3 :  INR @ 18:50 = 2.91   Home warfarin dose :    Warfarin 8 mg PO Q M - F. Warfarin 3 mg PO Sat-Sun   Goal of Therapy:  INR : 2.5 - 3.5  Monitor platelets by anticoagulation protocol: Yes   Plan:  INR is therapeutic at 2.63. Will continue pt home dose and check INR daily since pt is on abx.   Paulina Fusi, PharmD, BCPS 12/29/2018 9:30 AM

## 2018-12-29 NOTE — Care Management Note (Signed)
Case Management Note  Patient Details  Name: NEVELYN MELLOTT MRN: [id number] Date of Birth: 06-13-1946  Subjective/Objective:            Patient is from home alone.  She was admitted with PNA and sepsis.  She has a hx COPD and CHF.   Chronic O2 @2L .   Independent in all adls, denies issues accessing medical care, obtaining medications or with transportation. Current with PCP. Open to [facility] for home health RN, PT.  Notified Joelene Millin with [facility] of patient discharge.  Scale was provided by this RNCM last admission.  She states she still has it and weighs daily.  Does not use a walker or a cane.  She states a friend is going to pick her up today.  Denies difficulty obtaining medications and with accessing medical care.  No further needs identified at this time.                  Action/Plan:   Expected Discharge Date:  12/29/18               Expected Discharge Plan:  [place]  In-House Referral:     Discharge planning Services  CM Consult  Post Acute Care Choice:  Home Health, Resumption of Svcs/PTA Provider Choice offered to:  Patient  DME Arranged:    DME Agency:     HH Arranged:  RN, PT [place] Agency:  [facility]  Status of Service:  Completed, signed off  If discussed at [place] of Stay Meetings, dates discussed:    Additional Comments:  Elza Rafter, RN 12/29/2018, 11:37 AM

## 2018-12-29 NOTE — Progress Notes (Signed)
Patient has been discharged home at this time, she has her portable oxygen tank for transport, IV's removed, prescriptions provided, and patient educated on medication regimen and follow up appointment schedule.

## 2018-12-30 LAB — CULTURE, BLOOD (ROUTINE X 2)
Culture: NO GROWTH
Culture: NO GROWTH
Special Requests: ADEQUATE

## 2019-01-14 ENCOUNTER — Ambulatory Visit (INDEPENDENT_AMBULATORY_CARE_PROVIDER_SITE_OTHER): Payer: Medicare Other | Admitting: Vascular Surgery

## 2019-01-14 ENCOUNTER — Encounter (INDEPENDENT_AMBULATORY_CARE_PROVIDER_SITE_OTHER): Payer: Medicare Other

## 2019-01-25 ENCOUNTER — Encounter: Payer: Self-pay | Admitting: *Deleted

## 2019-01-25 ENCOUNTER — Encounter: Payer: Medicare Other | Attending: Internal Medicine | Admitting: *Deleted

## 2019-01-25 VITALS — Ht 67.75 in | Wt 210.5 lb

## 2019-01-25 DIAGNOSIS — Z7984 Long term (current) use of oral hypoglycemic drugs: Secondary | ICD-10-CM | POA: Diagnosis not present

## 2019-01-25 DIAGNOSIS — I5032 Chronic diastolic (congestive) heart failure: Secondary | ICD-10-CM | POA: Insufficient documentation

## 2019-01-25 DIAGNOSIS — Z85828 Personal history of other malignant neoplasm of skin: Secondary | ICD-10-CM | POA: Insufficient documentation

## 2019-01-25 DIAGNOSIS — K219 Gastro-esophageal reflux disease without esophagitis: Secondary | ICD-10-CM | POA: Insufficient documentation

## 2019-01-25 DIAGNOSIS — I110 Hypertensive heart disease with heart failure: Secondary | ICD-10-CM | POA: Diagnosis not present

## 2019-01-25 DIAGNOSIS — Z87891 Personal history of nicotine dependence: Secondary | ICD-10-CM | POA: Diagnosis not present

## 2019-01-25 DIAGNOSIS — E119 Type 2 diabetes mellitus without complications: Secondary | ICD-10-CM | POA: Diagnosis not present

## 2019-01-25 DIAGNOSIS — I4891 Unspecified atrial fibrillation: Secondary | ICD-10-CM | POA: Diagnosis not present

## 2019-01-25 DIAGNOSIS — Z79899 Other long term (current) drug therapy: Secondary | ICD-10-CM | POA: Diagnosis not present

## 2019-01-25 DIAGNOSIS — M1990 Unspecified osteoarthritis, unspecified site: Secondary | ICD-10-CM | POA: Diagnosis not present

## 2019-01-25 DIAGNOSIS — J449 Chronic obstructive pulmonary disease, unspecified: Secondary | ICD-10-CM | POA: Insufficient documentation

## 2019-01-25 DIAGNOSIS — Z7901 Long term (current) use of anticoagulants: Secondary | ICD-10-CM | POA: Diagnosis not present

## 2019-01-25 DIAGNOSIS — G4730 Sleep apnea, unspecified: Secondary | ICD-10-CM | POA: Diagnosis not present

## 2019-01-25 NOTE — Progress Notes (Signed)
Pulmonary Individual Treatment Plan  Patient Details  Name: Betty [name] MRN: [id number] Date of Birth: Jun 28, 1946 Referring Provider:     Pulmonary Rehab from 01/25/2019 in [facility] Cardiac and Pulmonary Rehab  Referring Provider  Derrill Kay      Initial Encounter Date:    Pulmonary Rehab from 01/25/2019 in [facility] Cardiac and Pulmonary Rehab  Date  01/25/19      Visit Diagnosis: Heart failure, diastolic, chronic ([place])  Patient's Home Medications on Admission:  Current Outpatient Medications:  .  acetaminophen (TYLENOL) 325 MG tablet, Take 2 tablets (650 mg total) by mouth every 6 (six) hours as needed for mild pain (or Fever >/= 101)., Disp: 30 tablet, Rfl: 0 .  ferrous sulfate 325 (65 FE) MG EC tablet, Take 325 mg by mouth daily. , Disp: , Rfl: 11 .  gabapentin (NEURONTIN) 600 MG tablet, Take 300-600 mg by mouth 3 (three) times daily. Take 2 capsules ('600mg'$ ) in morning, 1 capsule ('300mg'$ ) at midday, and 2 capsules ('600mg'$ ) at bedtime, Disp: , Rfl:  .  levocetirizine (XYZAL) 5 MG tablet, Take 5 mg by mouth at bedtime., Disp: , Rfl:  .  metFORMIN (GLUCOPHAGE) 1000 MG tablet, Take 500 mg by mouth 2 (two) times daily with a meal. , Disp: , Rfl:  .  metoprolol tartrate (LOPRESSOR) 25 MG tablet, Take 25 mg by mouth 2 (two) times daily., Disp: , Rfl:  .  pantoprazole (PROTONIX) 40 MG tablet, Take 40 mg by mouth daily., Disp: , Rfl:  .  potassium chloride (MICRO-K) 10 MEQ CR capsule, Take 20 mEq by mouth 2 (two) times daily., Disp: , Rfl:  .  ranitidine (ZANTAC) 150 MG tablet, Take 150 mg by mouth 2 (two) times daily., Disp: , Rfl:  .  rOPINIRole (REQUIP) 1 MG tablet, Take 1 mg by mouth at bedtime., Disp: , Rfl:  .  traZODone (DESYREL) 50 MG tablet, Take 50 mg by mouth at bedtime. , Disp: , Rfl:  .  venlafaxine XR (EFFEXOR-XR) 150 MG 24 hr capsule, Take 150 mg by mouth daily., Disp: , Rfl:  .  Vitamin D, Ergocalciferol, (DRISDOL) 50000 units CAPS capsule, Take 50,000 Units by mouth once a  week., Disp: , Rfl:  .  warfarin (COUMADIN) 3 MG tablet, Take 3 mg by mouth daily. On Saturday and Sunday, Disp: , Rfl:  .  warfarin (COUMADIN) 4 MG tablet, Take 8 mg by mouth daily. Monday - Friday, Disp: , Rfl:  .  cefdinir (OMNICEF) 300 MG capsule, Take 1 capsule (300 mg total) by mouth every 12 (twelve) hours. (Patient not taking: Reported on 01/25/2019), Disp: 4 capsule, Rfl: 0 .  guaiFENesin-dextromethorphan (ROBITUSSIN DM) 100-10 MG/5ML syrup, Take 5 mLs by mouth every 4 (four) hours as needed for cough. (Patient not taking: Reported on 01/25/2019), Disp: 118 mL, Rfl: 0  Past Medical History: Past Medical History:  Diagnosis Date  . Acute diastolic heart failure ([place])   . Allergy   . 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 Asthma   . Atrial fibrillation ([place])   . Backache, unspecified   . Cancer ([place])    skin  . Cardiomegaly   . COPD (chronic obstructive pulmonary disease) ([place])   . Diabetes mellitus without complication ([place])   . 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 Esophageal reflux   . Headache(784.0)   . Herpes zoster without mention of complication   . Hx: UTI (urinary tract infection)   . Hypertension  heart controlled w CHF  . Nontoxic uninodular goiter   . Obesity, unspecified   . Osteoarthrosis, unspecified whether generalized or localized, unspecified site   . Unspecified sleep apnea   . Urine incontinence    hx of    Tobacco Use: Social History   Tobacco Use  Smoking Status Former Smoker  . Packs/day: 0.50  . Years: 15.00  . Pack years: 7.50  . Types: Cigarettes  Smokeless Tobacco Never Used  Tobacco Comment   Has a 20-pack-year history, qutting in 1999    Labs: Recent Review Flowsheet Data    Labs for ITP Cardiac and Pulmonary Rehab Latest Ref Rng & Units 02/25/2018 02/26/2018 02/28/2018 10/30/2018 12/25/2018   Cholestrol 0 - 200 mg/dL - - - - -   LDLCALC 0 - 99 mg/dL - - - - -   HDL >39.00 mg/dL - - - - -   Trlycerides <150  mg/dL - - 82 - -   Hemoglobin A1c 4.8 - 5.6 % - - - - 6.7(H)   PHART 7.350 - 7.450 7.42 7.31(L) - 7.42 -   PCO2ART 32.0 - 48.0 mmHg 63(H) 84(HH) - 56(H) -   HCO3 20.0 - 28.0 mmol/L 40.9(H) 42.3(H) - 36.3(H) -   TCO2 0 - 100 mmol/L - - - - -   ACIDBASEDEF 0.0 - 2.0 mmol/L - - - - -   O2SAT % 99.5 93.7 - 98.7 -       Pulmonary Assessment Scores: Pulmonary Assessment Scores    Row Name 01/25/19 1504         ADL UCSD   ADL Phase  Entry     SOB Score total  73     Rest  0     Walk  3     Stairs  4     Bath  3     Dress  2     Shop  3       CAT Score   CAT Score  22       mMRC Score   mMRC Score  2        Pulmonary Function Assessment: Pulmonary Function Assessment - 01/25/19 1600      Breath   Bilateral Breath Sounds  Clear    Shortness of Breath  Yes;Limiting activity       Exercise Target Goals: Exercise Program Goal: Individual exercise prescription set using results from initial 6 min walk test and THRR while considering  patient's activity barriers and safety.   Exercise Prescription Goal: Initial exercise prescription builds to 30-45 minutes a day of aerobic activity, 2-3 days per week.  Home exercise guidelines will be given to patient during program as part of exercise prescription that the participant will acknowledge.  Activity Barriers & Risk Stratification: Activity Barriers & Cardiac Risk Stratification - 01/25/19 1621      Activity Barriers & Cardiac Risk Stratification   Activity Barriers  Back Problems;Neck/Spine Problems;Deconditioning;Muscular Weakness;Shortness of Breath;Balance Concerns;History of Falls   Hx broken neck from fall, chronic back pain      6 Minute Walk: 6 Minute Walk    Row Name 01/25/19 1620         6 Minute Walk   Phase  Initial     Distance  295 feet     Walk Time  2.5 minutes     # of Rest Breaks  2 1:30, stopped at 90mn     MPH  1.34     METS  0.58     RPE  15     Perceived Dyspnea   3     VO2 Peak  2.03      Symptoms  Yes (comment)     Comments  SOB     Resting HR  77 bpm     Resting BP  104/62     Resting Oxygen Saturation   92 %     Exercise Oxygen Saturation  during 6 min walk  87 %     Max Ex. HR  88 bpm     Max Ex. BP  120/80     2 Minute Post BP  110/60       Interval HR   2 Minute HR  82     3 Minute HR  84     4 Minute HR  87     5 Minute HR  85     6 Minute HR  88     2 Minute Post HR  73     Interval Heart Rate?  Yes       Interval Oxygen   Interval Oxygen?  Yes     Baseline Oxygen Saturation %  92 %     2 Minute Oxygen Saturation %  87 %     2 Minute Liters of Oxygen  2 L pulsed     3 Minute Oxygen Saturation %  89 %     3 Minute Liters of Oxygen  2 L     4 Minute Oxygen Saturation %  89 %     4 Minute Liters of Oxygen  2 L     5 Minute Oxygen Saturation %  87 %     5 Minute Liters of Oxygen  2 L     6 Minute Oxygen Saturation %  89 %     6 Minute Liters of Oxygen  2 L     2 Minute Post Oxygen Saturation %  98 %     2 Minute Post Liters of Oxygen  2 L       Oxygen Initial Assessment: Oxygen Initial Assessment - 01/25/19 1540      Home Oxygen   Home Oxygen Device  Portable Concentrator;Home Concentrator    Sleep Oxygen Prescription  Continuous;CPAP    Liters per minute  2    Home Exercise Oxygen Prescription  Pulsed    Liters per minute  2    Home at Rest Exercise Oxygen Prescription  Continuous    Liters per minute  2      Initial 6 min Walk   Oxygen Used  Portable Concentrator;Pulsed    Liters per minute  2      Program Oxygen Prescription   Program Oxygen Prescription  Continuous;E-Tanks    Liters per minute  2      Intervention   Short Term Goals  To learn and exhibit compliance with exercise, home and travel O2 prescription;To learn and understand importance of monitoring SPO2 with pulse oximeter and demonstrate accurate use of the pulse oximeter.;To learn and understand importance of maintaining oxygen saturations>88%;To learn and demonstrate  proper pursed lip breathing techniques or other breathing techniques.;To learn and demonstrate proper use of respiratory medications    Long  Term Goals  Exhibits compliance with exercise, home and travel O2 prescription;Verbalizes importance of monitoring SPO2 with pulse oximeter and return demonstration;Exhibits proper breathing techniques, such as pursed lip breathing or other method taught during program session;Maintenance of O2  saturations>88%;Compliance with respiratory medication;Demonstrates proper use of MDI's       Oxygen Re-Evaluation:   Oxygen Discharge (Final Oxygen Re-Evaluation):   Initial Exercise Prescription: Initial Exercise Prescription - 01/25/19 1600      Date of Initial Exercise RX and Referring Provider   Date  01/25/19    Referring Provider  Derrill Kay      Oxygen   Oxygen  Continuous    Liters  2      Treadmill   MPH  1    Grade  0    Minutes  15    METs  1.77      NuStep   Level  1    SPM  80    Minutes  15    METs  1.5      Biostep-RELP   Level  1    SPM  50    Minutes  15    METs  1      Prescription Details   Frequency (times per week)  3    Duration  Progress to 45 minutes of aerobic exercise without signs/symptoms of physical distress      Intensity   THRR 40-80% of Max Heartrate  105-134    Ratings of Perceived Exertion  11-13    Perceived Dyspnea  0-4      Progression   Progression  Continue to progress workloads to maintain intensity without signs/symptoms of physical distress.      Resistance Training   Training Prescription  Yes    Weight  3 lbs    Reps  10-15       Perform Capillary Blood Glucose checks as needed.  Exercise Prescription Changes: Exercise Prescription Changes    Row Name 01/25/19 1600             Response to Exercise   Blood Pressure (Admit)  104/62       Blood Pressure (Exercise)  120/80       Blood Pressure (Exit)  110/60       Heart Rate (Admit)  77 bpm       Heart Rate  (Exercise)  88 bpm       Heart Rate (Exit)  73 bpm       Oxygen Saturation (Admit)  92 %       Oxygen Saturation (Exercise)  87 %       Oxygen Saturation (Exit)  98 %       Rating of Perceived Exertion (Exercise)  15       Perceived Dyspnea (Exercise)  3       Symptoms  SOB       Comments  walk test results          Exercise Comments:   Exercise Goals and Review: Exercise Goals    Row Name 01/25/19 1624             Exercise Goals   Increase Physical Activity  Yes       Intervention  Provide advice, education, support and counseling about physical activity/exercise needs.;Develop an individualized exercise prescription for aerobic and resistive training based on initial evaluation findings, risk stratification, comorbidities and participant's personal goals.       Expected Outcomes  Short Term: Attend rehab on a regular basis to increase amount of physical activity.;Long Term: Add in home exercise to make exercise part of routine and to increase amount of physical activity.;Long Term: Exercising regularly at least 3-5 days a week.  Increase Strength and Stamina  Yes       Intervention  Provide advice, education, support and counseling about physical activity/exercise needs.;Develop an individualized exercise prescription for aerobic and resistive training based on initial evaluation findings, risk stratification, comorbidities and participant's personal goals.       Expected Outcomes  Short Term: Increase workloads from initial exercise prescription for resistance, speed, and METs.;Short Term: Perform resistance training exercises routinely during rehab and add in resistance training at home;Long Term: Improve cardiorespiratory fitness, muscular endurance and strength as measured by increased METs and functional capacity (6MWT)       Able to understand and use rate of perceived exertion (RPE) scale  Yes       Intervention  Provide education and explanation on how to use RPE scale        Expected Outcomes  Short Term: Able to use RPE daily in rehab to express subjective intensity level;Long Term:  Able to use RPE to guide intensity level when exercising independently       Able to understand and use Dyspnea scale  Yes       Intervention  Provide education and explanation on how to use Dyspnea scale       Expected Outcomes  Long Term: Able to use Dyspnea scale to guide intensity level when exercising independently;Short Term: Able to use Dyspnea scale daily in rehab to express subjective sense of shortness of breath during exertion       Knowledge and understanding of Target Heart Rate Range (THRR)  Yes       Intervention  Provide education and explanation of THRR including how the numbers were predicted and where they are located for reference       Expected Outcomes  Short Term: Able to state/look up THRR;Short Term: Able to use daily as guideline for intensity in rehab;Long Term: Able to use THRR to govern intensity when exercising independently       Able to check pulse independently  Yes       Intervention  Provide education and demonstration on how to check pulse in carotid and radial arteries.;Review the importance of being able to check your own pulse for safety during independent exercise       Expected Outcomes  Short Term: Able to explain why pulse checking is important during independent exercise;Long Term: Able to check pulse independently and accurately       Understanding of Exercise Prescription  Yes       Intervention  Provide education, explanation, and written materials on patient's individual exercise prescription       Expected Outcomes  Short Term: Able to explain program exercise prescription;Long Term: Able to explain home exercise prescription to exercise independently          Exercise Goals Re-Evaluation :   Discharge Exercise Prescription (Final Exercise Prescription Changes): Exercise Prescription Changes - 01/25/19 1600      Response to  Exercise   Blood Pressure (Admit)  104/62    Blood Pressure (Exercise)  120/80    Blood Pressure (Exit)  110/60    Heart Rate (Admit)  77 bpm    Heart Rate (Exercise)  88 bpm    Heart Rate (Exit)  73 bpm    Oxygen Saturation (Admit)  92 %    Oxygen Saturation (Exercise)  87 %    Oxygen Saturation (Exit)  98 %    Rating of Perceived Exertion (Exercise)  15    Perceived Dyspnea (Exercise)  3  Symptoms  SOB    Comments  walk test results       Nutrition:  Target Goals: Understanding of nutrition guidelines, daily intake of sodium '1500mg'$ , cholesterol '200mg'$ , calories 30% from fat and 7% or less from saturated fats, daily to have 5 or more servings of fruits and vegetables.  Biometrics: Pre Biometrics - 01/25/19 1624      Pre Biometrics   Height  5' 7.75" (1.721 m)    Weight  210 lb 8 oz (95.5 kg)    Waist Circumference  42 inches    Hip Circumference  49 inches    Waist to Hip Ratio  0.86 %    BMI (Calculated)  32.24        Nutrition Therapy Plan and Nutrition Goals: Nutrition Therapy & Goals - 01/25/19 1512      Intervention Plan   Intervention  Prescribe, educate and counsel regarding individualized specific dietary modifications aiming towards targeted core components such as weight, hypertension, lipid management, diabetes, heart failure and other comorbidities.    Expected Outcomes  Short Term Goal: Understand basic principles of dietary content, such as calories, fat, sodium, cholesterol and nutrients.       Nutrition Assessments: Nutrition Assessments - 01/25/19 1509      MEDFICTS Scores   Pre Score  36       Nutrition Goals Re-Evaluation:   Nutrition Goals Discharge (Final Nutrition Goals Re-Evaluation):   Psychosocial: Target Goals: Acknowledge presence or absence of significant depression and/or stress, maximize coping skills, provide positive support system. Participant is able to verbalize types and ability to use techniques and skills needed for  reducing stress and depression.   Initial Review & Psychosocial Screening: Initial Psych Review & Screening - 01/25/19 1512      Initial Review   Current issues with  History of Depression;Current Stress Concerns    Source of Stress Concerns  Chronic Illness;Unable to perform yard/household activities    Comments  Mother recently passed, husband passed in 2016, needing to take rest breaks wiht activities, has her house cleaned every two weeks.       Family Dynamics   Good Support System?  Yes    Comments  Huband passed in 2016, Son is her support system lives about 4 miles away but owns his own gym which can make it hard for him to get away from.       Barriers   Psychosocial barriers to participate in program  The patient should benefit from training in stress management and relaxation.;Psychosocial barriers identified (see note)      Screening Interventions   Interventions  Provide feedback about the scores to participant;Program counselor consult;Encouraged to exercise    Expected Outcomes  Short Term goal: Utilizing psychosocial counselor, staff and physician to assist with identification of specific Stressors or current issues interfering with healing process. Setting desired goal for each stressor or current issue identified.;Long Term Goal: Stressors or current issues are controlled or eliminated.;Short Term goal: Identification and review with participant of any Quality of Life or Depression concerns found by scoring the questionnaire.;Long Term goal: The participant improves quality of Life and PHQ9 Scores as seen by post scores and/or verbalization of changes       Quality of Life Scores:  Scores of 19 and below usually indicate a poorer quality of life in these areas.  A difference of  2-3 points is a clinically meaningful difference.  A difference of 2-3 points in the total score of the  Quality of Life Index has been associated with significant improvement in overall quality of  life, self-image, physical symptoms, and general health in studies assessing change in quality of life.  PHQ-9: Recent Review Flowsheet Data    Depression screen [facility] 2/9 01/25/2019 04/30/2016 12/30/2014   Decreased Interest 1 1 0   Down, Depressed, Hopeless 0 1 0   PHQ - 2 Score 1 2 0   Altered sleeping 2 0 -   Tired, decreased energy 3 1 -   Change in appetite 2 0 -   Feeling bad or failure about yourself  1 0 -   Trouble concentrating 2 0 -   Moving slowly or fidgety/restless 0 0 -   Suicidal thoughts 0 0 -   PHQ-9 Score 11 3 -   Difficult doing work/chores Somewhat difficult Not difficult at all -     Interpretation of Total Score  Total Score Depression Severity:  1-4 = Minimal depression, 5-9 = Mild depression, 10-14 = Moderate depression, 15-19 = Moderately severe depression, 20-27 = Severe depression   Psychosocial Evaluation and Intervention:   Psychosocial Re-Evaluation:   Psychosocial Discharge (Final Psychosocial Re-Evaluation):   Education: Education Goals: Education classes will be provided on a weekly basis, covering required topics. Participant will state understanding/return demonstration of topics presented.  Learning Barriers/Preferences: Learning Barriers/Preferences - 01/25/19 1625      Learning Barriers/Preferences   Learning Barriers  None    Learning Preferences  Individual Instruction       Education Topics:  Initial Evaluation Education: - Verbal, written and demonstration of respiratory meds, oximetry and breathing techniques. Instruction on use of nebulizers and MDIs and importance of monitoring MDI activations.   Pulmonary Rehab from 01/25/2019 in [facility] Cardiac and Pulmonary Rehab  Date  01/25/19  Educator  [facility]  Instruction Review Code  1- Verbalizes Understanding      General Nutrition Guidelines/Fats and Fiber: -Group instruction provided by verbal, written material, models and posters to present the general guidelines for heart healthy  nutrition. Gives an explanation and review of dietary fats and fiber.   Pulmonary Rehab from 06/10/2016 in [facility] Cardiac and Pulmonary Rehab  Date  05/27/16  Educator  CR  Instruction Review Code (retired)  2- meets goals/outcomes      Controlling Sodium/Reading Food Labels: -Group verbal and written material supporting the discussion of sodium use in heart healthy nutrition. Review and explanation with models, verbal and written materials for utilization of the food label.   Pulmonary Rehab from 06/10/2016 in [facility] Cardiac and Pulmonary Rehab  Date  06/10/16  Educator  LB  Instruction Review Code (retired)  2- meets goals/outcomes      Exercise Physiology & General Exercise Guidelines: - Group verbal and written instruction with models to review the exercise physiology of the cardiovascular system and associated critical values. Provides general exercise guidelines with specific guidelines to those with heart or lung disease.    Aerobic Exercise & Resistance Training: - Gives group verbal and written instruction on the various components of exercise. Focuses on aerobic and resistive training programs and the benefits of this training and how to safely progress through these programs.   Flexibility, Balance, Mind/Body Relaxation: Provides group verbal/written instruction on the benefits of flexibility and balance training, including mind/body exercise modes such as yoga, pilates and tai chi.  Demonstration and skill practice provided.   Stress and Anxiety: - Provides group verbal and written instruction about the health risks of elevated stress and causes of  high stress.  Discuss the correlation between heart/lung disease and anxiety and treatment options. Review healthy ways to manage with stress and anxiety.   Pulmonary Rehab from 06/10/2016 in [facility] Cardiac and Pulmonary Rehab  Date  05/08/16  Educator  [facility]  Instruction Review Code (retired)  2- meets goals/outcomes       Depression: - Provides group verbal and written instruction on the correlation between heart/lung disease and depressed mood, treatment options, and the stigmas associated with seeking treatment.   Pulmonary Rehab from 06/10/2016 in [facility] Cardiac and Pulmonary Rehab  Date  06/05/16  Educator  Kathreen Cornfield, [facility]  Instruction Review Code (retired)  2- meets goals/outcomes      Exercise & Equipment Safety: - Individual verbal instruction and demonstration of equipment use and safety with use of the equipment.   Pulmonary Rehab from 01/25/2019 in [facility] Cardiac and Pulmonary Rehab  Date  01/25/19  Educator  [facility]  Instruction Review Code  1- Verbalizes Understanding      Infection Prevention: - Provides verbal and written material to individual with discussion of infection control including proper hand washing and proper equipment cleaning during exercise session.   Pulmonary Rehab from 01/25/2019 in [facility] Cardiac and Pulmonary Rehab  Date  01/25/19  Educator  [facility]  Instruction Review Code  1- Verbalizes Understanding      Falls Prevention: - Provides verbal and written material to individual with discussion of falls prevention and safety.   Pulmonary Rehab from 01/25/2019 in [facility] Cardiac and Pulmonary Rehab  Date  01/25/19  Educator  [facility]  Instruction Review Code  1- Verbalizes Understanding      Diabetes: - Individual verbal and written instruction to review signs/symptoms of diabetes, desired ranges of glucose level fasting, after meals and with exercise. Advice that pre and post exercise glucose checks will be done for 3 sessions at entry of program.   Pulmonary Rehab from 01/25/2019 in [facility] Cardiac and Pulmonary Rehab  Date  01/25/19  Educator  [facility]  Instruction Review Code  1- Verbalizes Understanding      Chronic Lung Diseases: - Group verbal and written instruction to review updates, respiratory medications, advancements in procedures and treatments. Discuss use of supplemental oxygen  including available portable oxygen systems, continuous and intermittent flow rates, concentrators, personal use and safety guidelines. Review proper use of inhaler and spacers. Provide informative websites for self-education.    Energy Conservation: - Provide group verbal and written instruction for methods to conserve energy, plan and organize activities. Instruct on pacing techniques, use of adaptive equipment and posture/positioning to relieve shortness of breath.   Triggers and Exacerbations: - Group verbal and written instruction to review types of environmental triggers and ways to prevent exacerbations. Discuss weather changes, air quality and the benefits of nasal washing. Review warning signs and symptoms to help prevent infections. Discuss techniques for effective airway clearance, coughing, and vibrations.   AED/CPR: - Group verbal and written instruction with the use of models to demonstrate the basic use of the AED with the basic ABC's of resuscitation.   Pulmonary Rehab from 06/10/2016 in [facility] Cardiac and Pulmonary Rehab  Date  05/10/16  Educator  CE  Instruction Review Code (retired)  2- Lawyer and Physiology of the Lungs: - Group verbal and written instruction with the use of models to provide basic lung anatomy and physiology related to function, structure and complications of lung disease.   Pulmonary Rehab from 06/10/2016 in [facility] Cardiac  and Pulmonary Rehab  Date  06/07/16  Educator  SJ  Instruction Review Code (retired)  2- meets Designer, fashion/clothing & Physiology of the Heart: - Group verbal and written instruction and models provide basic cardiac anatomy and physiology, with the coronary electrical and arterial systems. Review of Valvular disease and Heart Failure   Cardiac Medications: - Group verbal and written instruction to review commonly prescribed medications for heart disease. Reviews the medication, class of the drug,  and side effects.   Know Your Numbers and Risk Factors: -Group verbal and written instruction about important numbers in your health.  Discussion of what are risk factors and how they play a role in the disease process.  Review of Cholesterol, Blood Pressure, Diabetes, and BMI and the role they play in your overall health.   Sleep Hygiene: -Provides group verbal and written instruction about how sleep can affect your health.  Define sleep hygiene, discuss sleep cycles and impact of sleep habits. Review good sleep hygiene tips.    Other: -Provides group and verbal instruction on various topics (see comments)    Knowledge Questionnaire Score: Knowledge Questionnaire Score - 01/25/19 1505      Knowledge Questionnaire Score   Pre Score  14/16   test reviewed with pt today       Core Components/Risk Factors/Patient Goals at Admission: Personal Goals and Risk Factors at Admission - 01/25/19 1510      Core Components/Risk Factors/Patient Goals on Admission    Weight Management  Yes;Obesity;Weight Loss    Intervention  Weight Management: Develop a combined nutrition and exercise program designed to reach desired caloric intake, while maintaining appropriate intake of nutrient and fiber, sodium and fats, and appropriate energy expenditure required for the weight goal.;Weight Management: Provide education and appropriate resources to help participant work on and attain dietary goals.;Obesity: Provide education and appropriate resources to help participant work on and attain dietary goals.;Weight Management/Obesity: Establish reasonable short term and long term weight goals.    Admit Weight  210 lb 8 oz (95.5 kg)    Goal Weight: Short Term  205 lb (93 kg)    Goal Weight: Long Term  175 lb (79.4 kg)    Expected Outcomes  Short Term: Continue to assess and modify interventions until short term weight is achieved;Long Term: Adherence to nutrition and physical activity/exercise program aimed  toward attainment of established weight goal;Weight Loss: Understanding of general recommendations for a balanced deficit meal plan, which promotes 1-2 lb weight loss per week and includes a negative energy balance of [phone] kcal/d;Understanding recommendations for meals to include 15-35% energy as protein, 25-35% energy from fat, 35-60% energy from carbohydrates, less than '200mg'$  of dietary cholesterol, 20-35 gm of total fiber daily;Understanding of distribution of calorie intake throughout the day with the consumption of 4-5 meals/snacks    Improve shortness of breath with ADL's  Yes    Intervention  Provide education, individualized exercise plan and daily activity instruction to help decrease symptoms of SOB with activities of daily living.    Expected Outcomes  Short Term: Improve cardiorespiratory fitness to achieve a reduction of symptoms when performing ADLs;Long Term: Be able to perform more ADLs without symptoms or delay the onset of symptoms    Diabetes  Yes    Intervention  Provide education about signs/symptoms and action to take for hypo/hyperglycemia.;Provide education about proper nutrition, including hydration, and aerobic/resistive exercise prescription along with prescribed medications to achieve blood glucose in normal ranges: Fasting  glucose 65-99 mg/dL    Expected Outcomes  Long Term: Attainment of HbA1C < 7%.;Short Term: Participant verbalizes understanding of the signs/symptoms and immediate care of hyper/hypoglycemia, proper foot care and importance of medication, aerobic/resistive exercise and nutrition plan for blood glucose control.    Heart Failure  Yes    Intervention  Provide a combined exercise and nutrition program that is supplemented with education, support and counseling about heart failure. Directed toward relieving symptoms such as shortness of breath, decreased exercise tolerance, and extremity edema.    Expected Outcomes  Improve functional capacity of life;Short  term: Attendance in program 2-3 days a week with increased exercise capacity. Reported lower sodium intake. Reported increased fruit and vegetable intake. Reports medication compliance.;Short term: Daily weights obtained and reported for increase. Utilizing diuretic protocols set by physician.;Long term: Adoption of self-care skills and reduction of barriers for early signs and symptoms recognition and intervention leading to self-care maintenance.    Hypertension  Yes    Intervention  Provide education on lifestyle modifcations including regular physical activity/exercise, weight management, moderate sodium restriction and increased consumption of fresh fruit, vegetables, and low fat dairy, alcohol moderation, and smoking cessation.;Monitor prescription use compliance.    Expected Outcomes  Short Term: Continued assessment and intervention until BP is < 140/34m HG in hypertensive participants. < 130/811mHG in hypertensive participants with diabetes, heart failure or chronic kidney disease.;Long Term: Maintenance of blood pressure at goal levels.    Lipids  Yes    Intervention  Provide education and support for participant on nutrition & aerobic/resistive exercise along with prescribed medications to achieve LDL '70mg'$ , HDL >'40mg'$ .    Expected Outcomes  Short Term: Participant states understanding of desired cholesterol values and is compliant with medications prescribed. Participant is following exercise prescription and nutrition guidelines.;Long Term: Cholesterol controlled with medications as prescribed, with individualized exercise RX and with personalized nutrition plan. Value goals: LDL < '70mg'$ , HDL > 40 mg.       Core Components/Risk Factors/Patient Goals Review:    Core Components/Risk Factors/Patient Goals at Discharge (Final Review):    ITP Comments: ITP Comments    Row Name 01/25/19 1617           ITP Comments  Completed medical evaulation and walk test.  [facility] [place] [facility]  ITP created and sent for review to Dr. MaEmily FilbertMedical Director.           Comments: Initial ITP

## 2019-01-25 NOTE — Progress Notes (Signed)
Daily Session Note  Patient Details  Name: Betty [name] MRN: [id number] Date of Birth: 1946/01/27 Referring Provider:     Pulmonary Rehab from 01/25/2019 in [facility] Cardiac and Pulmonary Rehab  Referring Provider  Derrill Kay      Encounter Date: 01/25/2019  Check In: Session Check In - 01/25/19 1615      Check-In   Supervising physician immediately available to respond to emergencies  [facility] immediately available ER MD    Physician(s)  Drs. Quentin Cornwall and [place]    Location  [facility]-Cardiac & Pulmonary Rehab    Staff Present  Alberteen Sam, MA, RCEP, CCRP, Exercise Physiologist;Kelly Amedeo Plenty, BS, ACSM CEP, Exercise Physiologist    Medication changes reported      No    Fall or balance concerns reported     No    Warm-up and Cool-down  Not performed (comment)   medical evaluation and walk test   Resistance Training Performed  No    VAD Patient?  No    PAD/SET Patient?  No      Pain Assessment   Currently in Pain?  No/denies        Exercise Prescription Changes - 01/25/19 1600      Response to Exercise   Blood Pressure (Admit)  104/62    Blood Pressure (Exercise)  120/80    Blood Pressure (Exit)  110/60    Heart Rate (Admit)  77 bpm    Heart Rate (Exercise)  88 bpm    Heart Rate (Exit)  73 bpm    Oxygen Saturation (Admit)  92 %    Oxygen Saturation (Exercise)  87 %    Oxygen Saturation (Exit)  98 %    Rating of Perceived Exertion (Exercise)  15    Perceived Dyspnea (Exercise)  3    Symptoms  SOB    Comments  walk test results       Social History   Tobacco Use  Smoking Status Former Smoker  . Packs/day: 0.50  . Years: 15.00  . Pack years: 7.50  . Types: Cigarettes  Smokeless Tobacco Never Used  Tobacco Comment   Has a 20-pack-year history, qutting in 1999    Goals Met:  Exercise tolerated well Personal goals reviewed Queuing for purse lip breathing No report of cardiac concerns or symptoms Strength training completed today  Goals Unmet:  Not  Applicable  Comments: Medical evaluation and walk test completed  Service time 1434-1600   Dr. Emily Filbert is Medical Director for [place] and [facility].

## 2019-01-25 NOTE — Patient Instructions (Signed)
Patient Instructions  Patient Details  Name: Betty [name] MRN: [id number] Date of Birth: Dec 06, 1946 Referring Provider:  Derrill Kay, MD  Below are your personal goals for exercise, nutrition, and risk factors. Our goal is to help you stay on track towards obtaining and maintaining these goals. We will be discussing your progress on these goals with you throughout the program.  Initial Exercise Prescription: Initial Exercise Prescription - 01/25/19 1600      Date of Initial Exercise RX and Referring Provider   Date  01/25/19    Referring Provider  Derrill Kay      Oxygen   Oxygen  Continuous    Liters  2      Treadmill   MPH  1    Grade  0    Minutes  15    METs  1.77      NuStep   Level  1    SPM  80    Minutes  15    METs  1.5      Biostep-RELP   Level  1    SPM  50    Minutes  15    METs  1      Prescription Details   Frequency (times per week)  3    Duration  Progress to 45 minutes of aerobic exercise without signs/symptoms of physical distress      Intensity   THRR 40-80% of Max Heartrate  105-134    Ratings of Perceived Exertion  11-13    Perceived Dyspnea  0-4      Progression   Progression  Continue to progress workloads to maintain intensity without signs/symptoms of physical distress.      Resistance Training   Training Prescription  Yes    Weight  3 lbs    Reps  10-15       Exercise Goals: Frequency: Be able to perform aerobic exercise two to three times per week in program working toward 2-5 days per week of home exercise.  Intensity: Work with a perceived exertion of 11 (fairly light) - 15 (hard) while following your exercise prescription.  We will make changes to your prescription with you as you progress through the program.   Duration: Be able to do 30 to 45 minutes of continuous aerobic exercise in addition to a 5 minute warm-up and a 5 minute cool-down routine.   Nutrition Goals: Your personal nutrition goals will be  established when you do your nutrition analysis with the dietician.  The following are general nutrition guidelines to follow: Cholesterol < 200mg /day Sodium < 1500mg /day Fiber: Women over 50 yrs - 21 grams per day  Personal Goals: Personal Goals and Risk Factors at Admission - 01/25/19 1510      Core Components/Risk Factors/Patient Goals on Admission    Weight Management  Yes;Obesity;Weight Loss    Intervention  Weight Management: Develop a combined nutrition and exercise program designed to reach desired caloric intake, while maintaining appropriate intake of nutrient and fiber, sodium and fats, and appropriate energy expenditure required for the weight goal.;Weight Management: Provide education and appropriate resources to help participant work on and attain dietary goals.;Obesity: Provide education and appropriate resources to help participant work on and attain dietary goals.;Weight Management/Obesity: Establish reasonable short term and long term weight goals.    Admit Weight  210 lb 8 oz (95.5 kg)    Goal Weight: Short Term  205 lb (93 kg)    Goal Weight: Long Term  175 lb (  79.4 kg)    Expected Outcomes  Short Term: Continue to assess and modify interventions until short term weight is achieved;Long Term: Adherence to nutrition and physical activity/exercise program aimed toward attainment of established weight goal;Weight Loss: Understanding of general recommendations for a balanced deficit meal plan, which promotes 1-2 lb weight loss per week and includes a negative energy balance of [phone] kcal/d;Understanding recommendations for meals to include 15-35% energy as protein, 25-35% energy from fat, 35-60% energy from carbohydrates, less than 200mg  of dietary cholesterol, 20-35 gm of total fiber daily;Understanding of distribution of calorie intake throughout the day with the consumption of 4-5 meals/snacks    Improve shortness of breath with ADL's  Yes    Intervention  Provide education,  individualized exercise plan and daily activity instruction to help decrease symptoms of SOB with activities of daily living.    Expected Outcomes  Short Term: Improve cardiorespiratory fitness to achieve a reduction of symptoms when performing ADLs;Long Term: Be able to perform more ADLs without symptoms or delay the onset of symptoms    Diabetes  Yes    Intervention  Provide education about signs/symptoms and action to take for hypo/hyperglycemia.;Provide education about proper nutrition, including hydration, and aerobic/resistive exercise prescription along with prescribed medications to achieve blood glucose in normal ranges: Fasting glucose 65-99 mg/dL    Expected Outcomes  Long Term: Attainment of HbA1C < 7%.;Short Term: Participant verbalizes understanding of the signs/symptoms and immediate care of hyper/hypoglycemia, proper foot care and importance of medication, aerobic/resistive exercise and nutrition plan for blood glucose control.    Heart Failure  Yes    Intervention  Provide a combined exercise and nutrition program that is supplemented with education, support and counseling about heart failure. Directed toward relieving symptoms such as shortness of breath, decreased exercise tolerance, and extremity edema.    Expected Outcomes  Improve functional capacity of life;Short term: Attendance in program 2-3 days a week with increased exercise capacity. Reported lower sodium intake. Reported increased fruit and vegetable intake. Reports medication compliance.;Short term: Daily weights obtained and reported for increase. Utilizing diuretic protocols set by physician.;Long term: Adoption of self-care skills and reduction of barriers for early signs and symptoms recognition and intervention leading to self-care maintenance.    Hypertension  Yes    Intervention  Provide education on lifestyle modifcations including regular physical activity/exercise, weight management, moderate sodium restriction and  increased consumption of fresh fruit, vegetables, and low fat dairy, alcohol moderation, and smoking cessation.;Monitor prescription use compliance.    Expected Outcomes  Short Term: Continued assessment and intervention until BP is < 140/69mm HG in hypertensive participants. < 130/51mm HG in hypertensive participants with diabetes, heart failure or chronic kidney disease.;Long Term: Maintenance of blood pressure at goal levels.    Lipids  Yes    Intervention  Provide education and support for participant on nutrition & aerobic/resistive exercise along with prescribed medications to achieve LDL 70mg , HDL >40mg .    Expected Outcomes  Short Term: Participant states understanding of desired cholesterol values and is compliant with medications prescribed. Participant is following exercise prescription and nutrition guidelines.;Long Term: Cholesterol controlled with medications as prescribed, with individualized exercise RX and with personalized nutrition plan. Value goals: LDL < 70mg , HDL > 40 mg.       Tobacco Use Initial Evaluation: Social History   Tobacco Use  Smoking Status Former Smoker  . Packs/day: 0.50  . Years: 15.00  . Pack years: 7.50  . Types: Cigarettes  Smokeless Tobacco Never Used  Tobacco Comment   Has a 20-pack-year history, qutting in 1999    Exercise Goals and Review: Exercise Goals    Row Name 01/25/19 1624             Exercise Goals   Increase Physical Activity  Yes       Intervention  Provide advice, education, support and counseling about physical activity/exercise needs.;Develop an individualized exercise prescription for aerobic and resistive training based on initial evaluation findings, risk stratification, comorbidities and participant's personal goals.       Expected Outcomes  Short Term: Attend rehab on a regular basis to increase amount of physical activity.;Long Term: Add in home exercise to make exercise part of routine and to increase amount of  physical activity.;Long Term: Exercising regularly at least 3-5 days a week.       Increase Strength and Stamina  Yes       Intervention  Provide advice, education, support and counseling about physical activity/exercise needs.;Develop an individualized exercise prescription for aerobic and resistive training based on initial evaluation findings, risk stratification, comorbidities and participant's personal goals.       Expected Outcomes  Short Term: Increase workloads from initial exercise prescription for resistance, speed, and METs.;Short Term: Perform resistance training exercises routinely during rehab and add in resistance training at home;Long Term: Improve cardiorespiratory fitness, muscular endurance and strength as measured by increased METs and functional capacity (6MWT)       Able to understand and use rate of perceived exertion (RPE) scale  Yes       Intervention  Provide education and explanation on how to use RPE scale       Expected Outcomes  Short Term: Able to use RPE daily in rehab to express subjective intensity level;Long Term:  Able to use RPE to guide intensity level when exercising independently       Able to understand and use Dyspnea scale  Yes       Intervention  Provide education and explanation on how to use Dyspnea scale       Expected Outcomes  Long Term: Able to use Dyspnea scale to guide intensity level when exercising independently;Short Term: Able to use Dyspnea scale daily in rehab to express subjective sense of shortness of breath during exertion       Knowledge and understanding of Target Heart Rate Range (THRR)  Yes       Intervention  Provide education and explanation of THRR including how the numbers were predicted and where they are located for reference       Expected Outcomes  Short Term: Able to state/look up THRR;Short Term: Able to use daily as guideline for intensity in rehab;Long Term: Able to use THRR to govern intensity when exercising independently        Able to check pulse independently  Yes       Intervention  Provide education and demonstration on how to check pulse in carotid and radial arteries.;Review the importance of being able to check your own pulse for safety during independent exercise       Expected Outcomes  Short Term: Able to explain why pulse checking is important during independent exercise;Long Term: Able to check pulse independently and accurately       Understanding of Exercise Prescription  Yes       Intervention  Provide education, explanation, and written materials on patient's individual exercise prescription       Expected Outcomes  Short Term: Able to explain program  exercise prescription;Long Term: Able to explain home exercise prescription to exercise independently          Copy of goals given to participant.

## 2019-01-29 ENCOUNTER — Encounter: Payer: Medicare Other | Admitting: *Deleted

## 2019-01-29 DIAGNOSIS — G4730 Sleep apnea, unspecified: Secondary | ICD-10-CM | POA: Diagnosis not present

## 2019-01-29 DIAGNOSIS — I5032 Chronic diastolic (congestive) heart failure: Secondary | ICD-10-CM

## 2019-01-29 LAB — GLUCOSE, CAPILLARY
GLUCOSE-CAPILLARY: 209 mg/dL — AB (ref 70–99)
Glucose-Capillary: 154 mg/dL — ABNORMAL HIGH (ref 70–99)

## 2019-01-29 NOTE — Progress Notes (Signed)
Daily Session Note  Patient Details  Name: Betty [name] MRN: [id number] Date of Birth: 30-Sep-1946 Referring Provider:     Pulmonary Rehab from 01/25/2019 in [facility] Cardiac and Pulmonary Rehab  Referring Provider  Derrill Kay      Encounter Date: 01/29/2019  Check In: Session Check In - 01/29/19 1143      Check-In   Supervising physician immediately available to respond to emergencies  [facility] immediately available ER MD    Physician(s)  Drs. Filiberto Pinks    Location  [facility]-Cardiac & Pulmonary Rehab    Staff Present  Renita Papa, RN BSN;Kiannah Grunow Luan Pulling, [place], RCEP, CCRP, Exercise Physiologist;Joseph Tessie Fass RCP,RRT,BSRT    Medication changes reported      No    Fall or balance concerns reported     No    Warm-up and Cool-down  Performed as group-led instruction    Resistance Training Performed  Yes    VAD Patient?  No    PAD/SET Patient?  No      Pain Assessment   Currently in Pain?  No/denies          Social History   Tobacco Use  Smoking Status Former Smoker  . Packs/day: 0.50  . Years: 15.00  . Pack years: 7.50  . Types: Cigarettes  Smokeless Tobacco Never Used  Tobacco Comment   Has a 20-pack-year history, qutting in 1999    Goals Met:  Exercise tolerated well Personal goals reviewed Queuing for purse lip breathing No report of cardiac concerns or symptoms Strength training completed today  Goals Unmet:  Not Applicable  Comments: First full day of exercise!  Patient was oriented to gym and equipment including functions, settings, policies, and procedures.  Patient's individual exercise prescription and treatment plan were reviewed.  All starting workloads were established based on the results of the 6 minute walk test done at initial orientation visit.  The plan for exercise progression was also introduced and progression will be customized based on patient's performance and goals.    Dr. Emily Filbert is Medical Director for [place] and [facility].

## 2019-02-01 ENCOUNTER — Ambulatory Visit: Payer: Medicare Other

## 2019-02-01 DIAGNOSIS — I5032 Chronic diastolic (congestive) heart failure: Secondary | ICD-10-CM

## 2019-02-01 DIAGNOSIS — G4730 Sleep apnea, unspecified: Secondary | ICD-10-CM | POA: Diagnosis not present

## 2019-02-01 LAB — GLUCOSE, CAPILLARY
Glucose-Capillary: 303 mg/dL — ABNORMAL HIGH (ref 70–99)
Glucose-Capillary: 347 mg/dL — ABNORMAL HIGH (ref 70–99)

## 2019-02-01 NOTE — Progress Notes (Signed)
Daily Session Note  Patient Details  Name: ADAEZE BETTER MRN: [id number] Date of Birth: Nov 11, 1946 Referring Provider:     Pulmonary Rehab from 01/25/2019 in [facility] Cardiac and Pulmonary Rehab  Referring Provider  Derrill Kay      Encounter Date: 02/01/2019  Check In:      Social History   Tobacco Use  Smoking Status Former Smoker  . Packs/day: 0.50  . Years: 15.00  . Pack years: 7.50  . Types: Cigarettes  Smokeless Tobacco Never Used  Tobacco Comment   Has a 20-pack-year history, qutting in 1999    Goals Met:  Proper associated with RPD/PD & O2 Sat  Goals Unmet:  Not Applicable  Comments: Kelleen did not feel well after first station.  Staff checked BG and it was 303. She drank water and recheck 15 min later was 347.  She was instructed to call her Dr if it continues to rise or she develops other symptoms.   Dr. Emily Filbert is Medical Director for [place] and [facility].

## 2019-02-01 NOTE — Progress Notes (Signed)
Pulmonary Individual Treatment Plan  Patient Details  Name: Betty [name] MRN: [id number] Date of Birth: 1946/01/13 Referring Provider:     Pulmonary Rehab from 01/25/2019 in [facility] Cardiac and Pulmonary Rehab  Referring Provider  Derrill Kay      Initial Encounter Date:    Pulmonary Rehab from 01/25/2019 in [facility] Cardiac and Pulmonary Rehab  Date  01/25/19      Visit Diagnosis: Heart failure, diastolic, chronic ([place])  Patient's Home Medications on Admission:  Current Outpatient Medications:  .  acetaminophen (TYLENOL) 325 MG tablet, Take 2 tablets (650 mg total) by mouth every 6 (six) hours as needed for mild pain (or Fever >/= 101)., Disp: 30 tablet, Rfl: 0 .  cefdinir (OMNICEF) 300 MG capsule, Take 1 capsule (300 mg total) by mouth every 12 (twelve) hours. (Patient not taking: Reported on 01/25/2019), Disp: 4 capsule, Rfl: 0 .  ferrous sulfate 325 (65 FE) MG EC tablet, Take 325 mg by mouth daily. , Disp: , Rfl: 11 .  gabapentin (NEURONTIN) 600 MG tablet, Take 300-600 mg by mouth 3 (three) times daily. Take 2 capsules (648m) in morning, 1 capsule (3044m at midday, and 2 capsules (60045mat bedtime, Disp: , Rfl:  .  guaiFENesin-dextromethorphan (ROBITUSSIN DM) 100-10 MG/5ML syrup, Take 5 mLs by mouth every 4 (four) hours as needed for cough. (Patient not taking: Reported on 01/25/2019), Disp: 118 mL, Rfl: 0 .  levocetirizine (XYZAL) 5 MG tablet, Take 5 mg by mouth at bedtime., Disp: , Rfl:  .  metFORMIN (GLUCOPHAGE) 1000 MG tablet, Take 500 mg by mouth 2 (two) times daily with a meal. , Disp: , Rfl:  .  metoprolol tartrate (LOPRESSOR) 25 MG tablet, Take 25 mg by mouth 2 (two) times daily., Disp: , Rfl:  .  pantoprazole (PROTONIX) 40 MG tablet, Take 40 mg by mouth daily., Disp: , Rfl:  .  potassium chloride (MICRO-K) 10 MEQ CR capsule, Take 20 mEq by mouth 2 (two) times daily., Disp: , Rfl:  .  ranitidine (ZANTAC) 150 MG tablet, Take 150 mg by mouth 2 (two) times daily., Disp: , Rfl:  .   rOPINIRole (REQUIP) 1 MG tablet, Take 1 mg by mouth at bedtime., Disp: , Rfl:  .  traZODone (DESYREL) 50 MG tablet, Take 50 mg by mouth at bedtime. , Disp: , Rfl:  .  venlafaxine XR (EFFEXOR-XR) 150 MG 24 hr capsule, Take 150 mg by mouth daily., Disp: , Rfl:  .  Vitamin D, Ergocalciferol, (DRISDOL) 50000 units CAPS capsule, Take 50,000 Units by mouth once a week., Disp: , Rfl:  .  warfarin (COUMADIN) 3 MG tablet, Take 3 mg by mouth daily. On Saturday and Sunday, Disp: , Rfl:  .  warfarin (COUMADIN) 4 MG tablet, Take 8 mg by mouth daily. Monday - Friday, Disp: , Rfl:   Past Medical History: Past Medical History:  Diagnosis Date  . Acute diastolic heart failure ([place] . Allergy   . ANCA-associated vasculitis ([place]CC[place] . Asthma   . Atrial [place] ([place] . Backache, unspecified   . Cancer ([place]  skin  . Cardiomegaly   . COPD (chronic obstructive pulmonary disease) ([place] . Diabetes mellitus without complication (HCCR[place] . 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 Esophageal reflux   . Headache(784.0)   . Herpes zoster without mention of complication   . Hx: UTI (urinary tract infection)   . Hypertension  heart controlled w CHF  . Nontoxic uninodular goiter   . Obesity, unspecified   . Osteoarthrosis, unspecified whether generalized or localized, unspecified site   . Unspecified sleep apnea   . Urine incontinence    hx of    Tobacco Use: Social History   Tobacco Use  Smoking Status Former Smoker  . Packs/day: 0.50  . Years: 15.00  . Pack years: 7.50  . Types: Cigarettes  Smokeless Tobacco Never Used  Tobacco Comment   Has a 20-pack-year history, qutting in 1999    Labs: Recent Review Flowsheet Data    Labs for ITP Cardiac and Pulmonary Rehab Latest Ref Rng & Units 02/25/2018 02/26/2018 02/28/2018 10/30/2018 12/25/2018   Cholestrol 0 - 200 mg/dL - - - - -   LDLCALC 0 - 99 mg/dL - - - - -   HDL >39.00 mg/dL - - - - -   Trlycerides <150 mg/dL  - - 82 - -   Hemoglobin A1c 4.8 - 5.6 % - - - - 6.7(H)   PHART 7.350 - 7.450 7.42 7.31(L) - 7.42 -   PCO2ART 32.0 - 48.0 mmHg 63(H) 84(HH) - 56(H) -   HCO3 20.0 - 28.0 mmol/L 40.9(H) 42.3(H) - 36.3(H) -   TCO2 0 - 100 mmol/L - - - - -   ACIDBASEDEF 0.0 - 2.0 mmol/L - - - - -   O2SAT % 99.5 93.7 - 98.7 -       Pulmonary Assessment Scores: Pulmonary Assessment Scores    Row Name 01/25/19 1504         ADL UCSD   ADL Phase  Entry     SOB Score total  73     Rest  0     Walk  3     Stairs  4     Bath  3     Dress  2     Shop  3       CAT Score   CAT Score  22       mMRC Score   mMRC Score  2        Pulmonary Function Assessment: Pulmonary Function Assessment - 01/25/19 1600      Breath   Bilateral Breath Sounds  Clear    Shortness of Breath  Yes;Limiting activity       Exercise Target Goals: Exercise Program Goal: Individual exercise prescription set using results from initial 6 min walk test and THRR while considering  patient's activity barriers and safety.   Exercise Prescription Goal: Initial exercise prescription builds to 30-45 minutes a day of aerobic activity, 2-3 days per week.  Home exercise guidelines will be given to patient during program as part of exercise prescription that the participant will acknowledge.  Activity Barriers & Risk Stratification: Activity Barriers & Cardiac Risk Stratification - 01/25/19 1621      Activity Barriers & Cardiac Risk Stratification   Activity Barriers  Back Problems;Neck/Spine Problems;Deconditioning;Muscular Weakness;Shortness of Breath;Balance Concerns;History of Falls   Hx broken neck from fall, chronic back pain      6 Minute Walk: 6 Minute Walk    Row Name 01/25/19 1620         6 Minute Walk   Phase  Initial     Distance  295 feet     Walk Time  2.5 minutes     # of Rest Breaks  2 1:30, stopped at 43mn     MPH  1.34     METS  0.58     RPE  15     Perceived Dyspnea   3     VO2 Peak  2.03      Symptoms  Yes (comment)     Comments  SOB     Resting HR  77 bpm     Resting BP  104/62     Resting Oxygen Saturation   92 %     Exercise Oxygen Saturation  during 6 min walk  87 %     Max Ex. HR  88 bpm     Max Ex. BP  120/80     2 Minute Post BP  110/60       Interval HR   2 Minute HR  82     3 Minute HR  84     4 Minute HR  87     5 Minute HR  85     6 Minute HR  88     2 Minute Post HR  73     Interval Heart Rate?  Yes       Interval Oxygen   Interval Oxygen?  Yes     Baseline Oxygen Saturation %  92 %     2 Minute Oxygen Saturation %  87 %     2 Minute Liters of Oxygen  2 L pulsed     3 Minute Oxygen Saturation %  89 %     3 Minute Liters of Oxygen  2 L     4 Minute Oxygen Saturation %  89 %     4 Minute Liters of Oxygen  2 L     5 Minute Oxygen Saturation %  87 %     5 Minute Liters of Oxygen  2 L     6 Minute Oxygen Saturation %  89 %     6 Minute Liters of Oxygen  2 L     2 Minute Post Oxygen Saturation %  98 %     2 Minute Post Liters of Oxygen  2 L       Oxygen Initial Assessment: Oxygen Initial Assessment - 01/25/19 1540      Home Oxygen   Home Oxygen Device  Portable Concentrator;Home Concentrator    Sleep Oxygen Prescription  Continuous;CPAP    Liters per minute  2    Home Exercise Oxygen Prescription  Pulsed    Liters per minute  2    Home at Rest Exercise Oxygen Prescription  Continuous    Liters per minute  2      Initial 6 min Walk   Oxygen Used  Portable Concentrator;Pulsed    Liters per minute  2      Program Oxygen Prescription   Program Oxygen Prescription  Continuous;E-Tanks    Liters per minute  2      Intervention   Short Term Goals  To learn and exhibit compliance with exercise, home and travel O2 prescription;To learn and understand importance of monitoring SPO2 with pulse oximeter and demonstrate accurate use of the pulse oximeter.;To learn and understand importance of maintaining oxygen saturations>88%;To learn and demonstrate  proper pursed lip breathing techniques or other breathing techniques.;To learn and demonstrate proper use of respiratory medications    Long  Term Goals  Exhibits compliance with exercise, home and travel O2 prescription;Verbalizes importance of monitoring SPO2 with pulse oximeter and return demonstration;Exhibits proper breathing techniques, such as pursed lip breathing or other method taught during program session;Maintenance of O2  saturations>88%;Compliance with respiratory medication;Demonstrates proper use of MDI's       Oxygen Re-Evaluation: Oxygen Re-Evaluation    Row Name 01/29/19 1145             Goals/Expected Outcomes   Short Term Goals  To learn and exhibit compliance with exercise, home and travel O2 prescription;To learn and understand importance of monitoring SPO2 with pulse oximeter and demonstrate accurate use of the pulse oximeter.;To learn and understand importance of maintaining oxygen saturations>88%;To learn and demonstrate proper pursed lip breathing techniques or other breathing techniques.       Long  Term Goals  Exhibits compliance with exercise, home and travel O2 prescription;Verbalizes importance of monitoring SPO2 with pulse oximeter and return demonstration;Maintenance of O2 saturations>88%;Exhibits proper breathing techniques, such as pursed lip breathing or other method taught during program session       Comments  Reviewed PLB technique with pt.  Talked about how it work and it's important to maintaining his exercise saturations.         Goals/Expected Outcomes  Short: Become more profiecient at using PLB.   Long: Become independent at using PLB.          Oxygen Discharge (Final Oxygen Re-Evaluation): Oxygen Re-Evaluation - 01/29/19 1145      Goals/Expected Outcomes   Short Term Goals  To learn and exhibit compliance with exercise, home and travel O2 prescription;To learn and understand importance of monitoring SPO2 with pulse oximeter and demonstrate  accurate use of the pulse oximeter.;To learn and understand importance of maintaining oxygen saturations>88%;To learn and demonstrate proper pursed lip breathing techniques or other breathing techniques.    Long  Term Goals  Exhibits compliance with exercise, home and travel O2 prescription;Verbalizes importance of monitoring SPO2 with pulse oximeter and return demonstration;Maintenance of O2 saturations>88%;Exhibits proper breathing techniques, such as pursed lip breathing or other method taught during program session    Comments  Reviewed PLB technique with pt.  Talked about how it work and it's important to maintaining his exercise saturations.      Goals/Expected Outcomes  Short: Become more profiecient at using PLB.   Long: Become independent at using PLB.       Initial Exercise Prescription: Initial Exercise Prescription - 01/25/19 1600      Date of Initial Exercise RX and Referring Provider   Date  01/25/19    Referring Provider  Derrill Kay      Oxygen   Oxygen  Continuous    Liters  2      Treadmill   MPH  1    Grade  0    Minutes  15    METs  1.77      NuStep   Level  1    SPM  80    Minutes  15    METs  1.5      Biostep-RELP   Level  1    SPM  50    Minutes  15    METs  1      Prescription Details   Frequency (times per week)  3    Duration  Progress to 45 minutes of aerobic exercise without signs/symptoms of physical distress      Intensity   THRR 40-80% of Max Heartrate  105-134    Ratings of Perceived Exertion  11-13    Perceived Dyspnea  0-4      Progression   Progression  Continue to progress workloads to maintain intensity without signs/symptoms of physical  distress.      Resistance Training   Training Prescription  Yes    Weight  3 lbs    Reps  10-15       Perform Capillary Blood Glucose checks as needed.  Exercise Prescription Changes: Exercise Prescription Changes    Row Name 01/25/19 1600             Response to Exercise    Blood Pressure (Admit)  104/62       Blood Pressure (Exercise)  120/80       Blood Pressure (Exit)  110/60       Heart Rate (Admit)  77 bpm       Heart Rate (Exercise)  88 bpm       Heart Rate (Exit)  73 bpm       Oxygen Saturation (Admit)  92 %       Oxygen Saturation (Exercise)  87 %       Oxygen Saturation (Exit)  98 %       Rating of Perceived Exertion (Exercise)  15       Perceived Dyspnea (Exercise)  3       Symptoms  SOB       Comments  walk test results          Exercise Comments: Exercise Comments    Row Name 01/29/19 1144           Exercise Comments   First full day of exercise!  Patient was oriented to gym and equipment including functions, settings, policies, and procedures.  Patient's individual exercise prescription and treatment plan were reviewed.  All starting workloads were established based on the results of the 6 minute walk test done at initial orientation visit.  The plan for exercise progression was also introduced and progression will be customized based on patient's performance and goals.          Exercise Goals and Review: Exercise Goals    Row Name 01/25/19 1624             Exercise Goals   Increase Physical Activity  Yes       Intervention  Provide advice, education, support and counseling about physical activity/exercise needs.;Develop an individualized exercise prescription for aerobic and resistive training based on initial evaluation findings, risk stratification, comorbidities and participant's personal goals.       Expected Outcomes  Short Term: Attend rehab on a regular basis to increase amount of physical activity.;Long Term: Add in home exercise to make exercise part of routine and to increase amount of physical activity.;Long Term: Exercising regularly at least 3-5 days a week.       Increase Strength and Stamina  Yes       Intervention  Provide advice, education, support and counseling about physical activity/exercise needs.;Develop an  individualized exercise prescription for aerobic and resistive training based on initial evaluation findings, risk stratification, comorbidities and participant's personal goals.       Expected Outcomes  Short Term: Increase workloads from initial exercise prescription for resistance, speed, and METs.;Short Term: Perform resistance training exercises routinely during rehab and add in resistance training at home;Long Term: Improve cardiorespiratory fitness, muscular endurance and strength as measured by increased METs and functional capacity (6MWT)       Able to understand and use rate of perceived exertion (RPE) scale  Yes       Intervention  Provide education and explanation on how to use RPE scale  Expected Outcomes  Short Term: Able to use RPE daily in rehab to express subjective intensity level;Long Term:  Able to use RPE to guide intensity level when exercising independently       Able to understand and use Dyspnea scale  Yes       Intervention  Provide education and explanation on how to use Dyspnea scale       Expected Outcomes  Long Term: Able to use Dyspnea scale to guide intensity level when exercising independently;Short Term: Able to use Dyspnea scale daily in rehab to express subjective sense of shortness of breath during exertion       Knowledge and understanding of Target Heart Rate Range (THRR)  Yes       Intervention  Provide education and explanation of THRR including how the numbers were predicted and where they are located for reference       Expected Outcomes  Short Term: Able to state/look up THRR;Short Term: Able to use daily as guideline for intensity in rehab;Long Term: Able to use THRR to govern intensity when exercising independently       Able to check pulse independently  Yes       Intervention  Provide education and demonstration on how to check pulse in carotid and radial arteries.;Review the importance of being able to check your own pulse for safety during  independent exercise       Expected Outcomes  Short Term: Able to explain why pulse checking is important during independent exercise;Long Term: Able to check pulse independently and accurately       Understanding of Exercise Prescription  Yes       Intervention  Provide education, explanation, and written materials on patient's individual exercise prescription       Expected Outcomes  Short Term: Able to explain program exercise prescription;Long Term: Able to explain home exercise prescription to exercise independently          Exercise Goals Re-Evaluation : Exercise Goals Re-Evaluation    Row Name 01/29/19 1144             Exercise Goal Re-Evaluation   Exercise Goals Review  Increase Physical Activity;Increase Strength and Stamina;Able to understand and use rate of perceived exertion (RPE) scale;Knowledge and understanding of Target Heart Rate Range (THRR);Understanding of Exercise Prescription       Comments  Reviewed RPE scale, THR and program prescription with pt today.  Pt voiced understanding and was given a copy of goals to take home.        Expected Outcomes  Short: Use RPE daily to regulate intensity. Long: Follow program prescription in THR.          Discharge Exercise Prescription (Final Exercise Prescription Changes): Exercise Prescription Changes - 01/25/19 1600      Response to Exercise   Blood Pressure (Admit)  104/62    Blood Pressure (Exercise)  120/80    Blood Pressure (Exit)  110/60    Heart Rate (Admit)  77 bpm    Heart Rate (Exercise)  88 bpm    Heart Rate (Exit)  73 bpm    Oxygen Saturation (Admit)  92 %    Oxygen Saturation (Exercise)  87 %    Oxygen Saturation (Exit)  98 %    Rating of Perceived Exertion (Exercise)  15    Perceived Dyspnea (Exercise)  3    Symptoms  SOB    Comments  walk test results       Nutrition:  Target  Goals: Understanding of nutrition guidelines, daily intake of sodium <1567m, cholesterol <2032m calories 30% from fat and  7% or less from saturated fats, daily to have 5 or more servings of fruits and vegetables.  Biometrics: Pre Biometrics - 01/25/19 1624      Pre Biometrics   Height  5' 7.75" (1.721 m)    Weight  210 lb 8 oz (95.5 kg)    Waist Circumference  42 inches    Hip Circumference  49 inches    Waist to Hip Ratio  0.86 %    BMI (Calculated)  32.24        Nutrition Therapy Plan and Nutrition Goals: Nutrition Therapy & Goals - 01/25/19 1512      Intervention Plan   Intervention  Prescribe, educate and counsel regarding individualized specific dietary modifications aiming towards targeted core components such as weight, hypertension, lipid management, diabetes, heart failure and other comorbidities.    Expected Outcomes  Short Term Goal: Understand basic principles of dietary content, such as calories, fat, sodium, cholesterol and nutrients.       Nutrition Assessments: Nutrition Assessments - 01/25/19 1509      MEDFICTS Scores   Pre Score  36       Nutrition Goals Re-Evaluation:   Nutrition Goals Discharge (Final Nutrition Goals Re-Evaluation):   Psychosocial: Target Goals: Acknowledge presence or absence of significant depression and/or stress, maximize coping skills, provide positive support system. Participant is able to verbalize types and ability to use techniques and skills needed for reducing stress and depression.   Initial Review & Psychosocial Screening: Initial Psych Review & Screening - 01/25/19 1512      Initial Review   Current issues with  History of Depression;Current Stress Concerns    Source of Stress Concerns  Chronic Illness;Unable to perform yard/household activities    Comments  Mother recently passed, husband passed in 2016, needing to take rest breaks wiht activities, has her house cleaned every two weeks.       Family Dynamics   Good Support System?  Yes    Comments  Huband passed in 2016, Son is her support system lives about 4 miles away but owns his  own gym which can make it hard for him to get away from.       Barriers   Psychosocial barriers to participate in program  The patient should benefit from training in stress management and relaxation.;Psychosocial barriers identified (see note)      Screening Interventions   Interventions  Provide feedback about the scores to participant;Program counselor consult;Encouraged to exercise    Expected Outcomes  Short Term goal: Utilizing psychosocial counselor, staff and physician to assist with identification of specific Stressors or current issues interfering with healing process. Setting desired goal for each stressor or current issue identified.;Long Term Goal: Stressors or current issues are controlled or eliminated.;Short Term goal: Identification and review with participant of any Quality of Life or Depression concerns found by scoring the questionnaire.;Long Term goal: The participant improves quality of Life and PHQ9 Scores as seen by post scores and/or verbalization of changes       Quality of Life Scores:  Scores of 19 and below usually indicate a poorer quality of life in these areas.  A difference of  2-3 points is a clinically meaningful difference.  A difference of 2-3 points in the total score of the Quality of Life Index has been associated with significant improvement in overall quality of life, self-image, physical symptoms, and  general health in studies assessing change in quality of life.  PHQ-9: Recent Review Flowsheet Data    Depression screen [facility] 2/9 01/25/2019 04/30/2016 12/30/2014   Decreased Interest 1 1 0   Down, Depressed, Hopeless 0 1 0   PHQ - 2 Score 1 2 0   Altered sleeping 2 0 -   Tired, decreased energy 3 1 -   Change in appetite 2 0 -   Feeling bad or failure about yourself  1 0 -   Trouble concentrating 2 0 -   Moving slowly or fidgety/restless 0 0 -   Suicidal thoughts 0 0 -   PHQ-9 Score 11 3 -   Difficult doing work/chores Somewhat difficult Not difficult at  all -     Interpretation of Total Score  Total Score Depression Severity:  1-4 = Minimal depression, 5-9 = Mild depression, 10-14 = Moderate depression, 15-19 = Moderately severe depression, 20-27 = Severe depression   Psychosocial Evaluation and Intervention:   Psychosocial Re-Evaluation:   Psychosocial Discharge (Final Psychosocial Re-Evaluation):   Education: Education Goals: Education classes will be provided on a weekly basis, covering required topics. Participant will state understanding/return demonstration of topics presented.  Learning Barriers/Preferences: Learning Barriers/Preferences - 01/25/19 1625      Learning Barriers/Preferences   Learning Barriers  None    Learning Preferences  Individual Instruction       Education Topics:  Initial Evaluation Education: - Verbal, written and demonstration of respiratory meds, oximetry and breathing techniques. Instruction on use of nebulizers and MDIs and importance of monitoring MDI activations.   Pulmonary Rehab from 01/29/2019 in [facility] Cardiac and Pulmonary Rehab  Date  01/25/19  Educator  [facility]  Instruction Review Code  1- Verbalizes Understanding      General Nutrition Guidelines/Fats and Fiber: -Group instruction provided by verbal, written material, models and posters to present the general guidelines for heart healthy nutrition. Gives an explanation and review of dietary fats and fiber.   Pulmonary Rehab from 06/10/2016 in [facility] Cardiac and Pulmonary Rehab  Date  05/27/16  Educator  CR  Instruction Review Code (retired)  2- meets goals/outcomes      Controlling Sodium/Reading Food Labels: -Group verbal and written material supporting the discussion of sodium use in heart healthy nutrition. Review and explanation with models, verbal and written materials for utilization of the food label.   Pulmonary Rehab from 06/10/2016 in [facility] Cardiac and Pulmonary Rehab  Date  06/10/16  Educator  LB  Instruction Review Code  (retired)  2- meets goals/outcomes      Exercise Physiology & General Exercise Guidelines: - Group verbal and written instruction with models to review the exercise physiology of the cardiovascular system and associated critical values. Provides general exercise guidelines with specific guidelines to those with heart or lung disease.    Aerobic Exercise & Resistance Training: - Gives group verbal and written instruction on the various components of exercise. Focuses on aerobic and resistive training programs and the benefits of this training and how to safely progress through these programs.   Flexibility, Balance, Mind/Body Relaxation: Provides group verbal/written instruction on the benefits of flexibility and balance training, including mind/body exercise modes such as yoga, pilates and tai chi.  Demonstration and skill practice provided.   Stress and Anxiety: - Provides group verbal and written instruction about the health risks of elevated stress and causes of high stress.  Discuss the correlation between heart/lung disease and anxiety and treatment options. Review healthy ways to manage  with stress and anxiety.   Pulmonary Rehab from 06/10/2016 in [facility] Cardiac and Pulmonary Rehab  Date  05/08/16  Educator  [facility]  Instruction Review Code (retired)  2- meets goals/outcomes      Depression: - Provides group verbal and written instruction on the correlation between heart/lung disease and depressed mood, treatment options, and the stigmas associated with seeking treatment.   Pulmonary Rehab from 06/10/2016 in [facility] Cardiac and Pulmonary Rehab  Date  06/05/16  Educator  Kathreen Cornfield, [facility]  Instruction Review Code (retired)  2- meets goals/outcomes      Exercise & Equipment Safety: - Individual verbal instruction and demonstration of equipment use and safety with use of the equipment.   Pulmonary Rehab from 01/29/2019 in [facility] Cardiac and Pulmonary Rehab  Date  01/25/19  Educator  [facility]   Instruction Review Code  1- Verbalizes Understanding      Infection Prevention: - Provides verbal and written material to individual with discussion of infection control including proper hand washing and proper equipment cleaning during exercise session.   Pulmonary Rehab from 01/29/2019 in [facility] Cardiac and Pulmonary Rehab  Date  01/25/19  Educator  [facility]  Instruction Review Code  1- Verbalizes Understanding      Falls Prevention: - Provides verbal and written material to individual with discussion of falls prevention and safety.   Pulmonary Rehab from 01/29/2019 in [facility] Cardiac and Pulmonary Rehab  Date  01/25/19  Educator  [facility]  Instruction Review Code  1- Verbalizes Understanding      Diabetes: - Individual verbal and written instruction to review signs/symptoms of diabetes, desired ranges of glucose level fasting, after meals and with exercise. Advice that pre and post exercise glucose checks will be done for 3 sessions at entry of program.   Pulmonary Rehab from 01/29/2019 in [facility] Cardiac and Pulmonary Rehab  Date  01/25/19  Educator  [facility]  Instruction Review Code  1- Verbalizes Understanding      Chronic Lung Diseases: - Group verbal and written instruction to review updates, respiratory medications, advancements in procedures and treatments. Discuss use of supplemental oxygen including available portable oxygen systems, continuous and intermittent flow rates, concentrators, personal use and safety guidelines. Review proper use of inhaler and spacers. Provide informative websites for self-education.    Pulmonary Rehab from 01/29/2019 in [facility] Cardiac and Pulmonary Rehab  Date  01/29/19  Educator  [facility]  Instruction Review Code  1- Verbalizes Understanding      Energy Conservation: - Provide group verbal and written instruction for methods to conserve energy, plan and organize activities. Instruct on pacing techniques, use of adaptive equipment and posture/positioning to relieve  shortness of breath.   Triggers and Exacerbations: - Group verbal and written instruction to review types of environmental triggers and ways to prevent exacerbations. Discuss weather changes, air quality and the benefits of nasal washing. Review warning signs and symptoms to help prevent infections. Discuss techniques for effective airway clearance, coughing, and vibrations.   AED/CPR: - Group verbal and written instruction with the use of models to demonstrate the basic use of the AED with the basic ABC's of resuscitation.   Pulmonary Rehab from 06/10/2016 in [facility] Cardiac and Pulmonary Rehab  Date  05/10/16  Educator  [facility]  Instruction Review Code (retired)  2- Lawyer and Physiology of the Lungs: - Group verbal and written instruction with the use of models to provide basic lung anatomy and physiology related to function, structure and complications  of lung disease.   Pulmonary Rehab from 06/10/2016 in [facility] Cardiac and Pulmonary Rehab  Date  06/07/16  Educator  [place]  Instruction Review Code (retired)  2- meets Designer, fashion/clothing & Physiology of the Heart: - Group verbal and written instruction and models provide basic cardiac anatomy and physiology, with the coronary electrical and arterial systems. Review of Valvular disease and Heart Failure   Cardiac Medications: - Group verbal and written instruction to review commonly prescribed medications for heart disease. Reviews the medication, class of the drug, and side effects.   Know Your Numbers and Risk Factors: -Group verbal and written instruction about important numbers in your health.  Discussion of what are risk factors and how they play a role in the disease process.  Review of Cholesterol, Blood Pressure, Diabetes, and BMI and the role they play in your overall health.   Sleep Hygiene: -Provides group verbal and written instruction about how sleep can affect your health.  Define sleep  hygiene, discuss sleep cycles and impact of sleep habits. Review good sleep hygiene tips.    Other: -Provides group and verbal instruction on various topics (see comments)    Knowledge Questionnaire Score: Knowledge Questionnaire Score - 01/25/19 1505      Knowledge Questionnaire Score   Pre Score  14/16   test reviewed with pt today       Core Components/Risk Factors/Patient Goals at Admission: Personal Goals and Risk Factors at Admission - 01/25/19 1510      Core Components/Risk Factors/Patient Goals on Admission    Weight Management  Yes;Obesity;Weight Loss    Intervention  Weight Management: Develop a combined nutrition and exercise program designed to reach desired caloric intake, while maintaining appropriate intake of nutrient and fiber, sodium and fats, and appropriate energy expenditure required for the weight goal.;Weight Management: Provide education and appropriate resources to help participant work on and attain dietary goals.;Obesity: Provide education and appropriate resources to help participant work on and attain dietary goals.;Weight Management/Obesity: Establish reasonable short term and long term weight goals.    Admit Weight  210 lb 8 oz (95.5 kg)    Goal Weight: Short Term  205 lb (93 kg)    Goal Weight: Long Term  175 lb (79.4 kg)    Expected Outcomes  Short Term: Continue to assess and modify interventions until short term weight is achieved;Long Term: Adherence to nutrition and physical activity/exercise program aimed toward attainment of established weight goal;Weight Loss: Understanding of general recommendations for a balanced deficit meal plan, which promotes 1-2 lb weight loss per week and includes a negative energy balance of [phone] kcal/d;Understanding recommendations for meals to include 15-35% energy as protein, 25-35% energy from fat, 35-60% energy from carbohydrates, less than 236m of dietary cholesterol, 20-35 gm of total fiber daily;Understanding of  distribution of calorie intake throughout the day with the consumption of 4-5 meals/snacks    Improve shortness of breath with ADL's  Yes    Intervention  Provide education, individualized exercise plan and daily activity instruction to help decrease symptoms of SOB with activities of daily living.    Expected Outcomes  Short Term: Improve cardiorespiratory fitness to achieve a reduction of symptoms when performing ADLs;Long Term: Be able to perform more ADLs without symptoms or delay the onset of symptoms    Diabetes  Yes    Intervention  Provide education about signs/symptoms and action to take for hypo/hyperglycemia.;Provide education about proper nutrition, including hydration, and aerobic/resistive exercise prescription  along with prescribed medications to achieve blood glucose in normal ranges: Fasting glucose 65-99 mg/dL    Expected Outcomes  Long Term: Attainment of HbA1C < 7%.;Short Term: Participant verbalizes understanding of the signs/symptoms and immediate care of hyper/hypoglycemia, proper foot care and importance of medication, aerobic/resistive exercise and nutrition plan for blood glucose control.    Heart Failure  Yes    Intervention  Provide a combined exercise and nutrition program that is supplemented with education, support and counseling about heart failure. Directed toward relieving symptoms such as shortness of breath, decreased exercise tolerance, and extremity edema.    Expected Outcomes  Improve functional capacity of life;Short term: Attendance in program 2-3 days a week with increased exercise capacity. Reported lower sodium intake. Reported increased fruit and vegetable intake. Reports medication compliance.;Short term: Daily weights obtained and reported for increase. Utilizing diuretic protocols set by physician.;Long term: Adoption of self-care skills and reduction of barriers for early signs and symptoms recognition and intervention leading to self-care maintenance.     Hypertension  Yes    Intervention  Provide education on lifestyle modifcations including regular physical activity/exercise, weight management, moderate sodium restriction and increased consumption of fresh fruit, vegetables, and low fat dairy, alcohol moderation, and smoking cessation.;Monitor prescription use compliance.    Expected Outcomes  Short Term: Continued assessment and intervention until BP is < 140/63m HG in hypertensive participants. < 130/857mHG in hypertensive participants with diabetes, heart failure or chronic kidney disease.;Long Term: Maintenance of blood pressure at goal levels.    Lipids  Yes    Intervention  Provide education and support for participant on nutrition & aerobic/resistive exercise along with prescribed medications to achieve LDL <7040mHDL >40m72m  Expected Outcomes  Short Term: Participant states understanding of desired cholesterol values and is compliant with medications prescribed. Participant is following exercise prescription and nutrition guidelines.;Long Term: Cholesterol controlled with medications as prescribed, with individualized exercise RX and with personalized nutrition plan. Value goals: LDL < 70mg22mL > 40 mg.       Core Components/Risk Factors/Patient Goals Review:    Core Components/Risk Factors/Patient Goals at Discharge (Final Review):    ITP Comments: ITP Comments    Row Name 01/25/19 1617 02/01/19 0841         ITP Comments  Completed medical evaulation and walk test.  Documentation for diagnosis can be found in Care Everywhere office visit from 01/12/2019.  ITP created and sent for review to Dr. Mark Emily Filbertical Director.   30 day review completed. ITP sent to Dr. Mark Emily Filbertctor of [facility] with ITP unless changes are made by physician.         Comments: 30 day review

## 2019-02-03 DIAGNOSIS — I5032 Chronic diastolic (congestive) heart failure: Secondary | ICD-10-CM

## 2019-02-03 DIAGNOSIS — G4730 Sleep apnea, unspecified: Secondary | ICD-10-CM | POA: Diagnosis not present

## 2019-02-03 LAB — GLUCOSE, CAPILLARY
GLUCOSE-CAPILLARY: 246 mg/dL — AB (ref 70–99)
Glucose-Capillary: 248 mg/dL — ABNORMAL HIGH (ref 70–99)

## 2019-02-03 NOTE — Progress Notes (Signed)
Daily Session Note  Patient Details  Name: Betty [name] MRN: [id number] Date of Birth: [birth year] Referring Provider:     Pulmonary Rehab from 01/25/2019 in [facility] Cardiac and Pulmonary Rehab  Referring Provider  Derrill Kay      Encounter Date: 02/03/2019  Check In: Session Check In - 02/03/19 1136      Check-In   Supervising physician immediately available to respond to emergencies  [facility] immediately available ER MD    Physician(s)  Dr. Kerman Passey and Dr. Mariea Clonts    Location  [facility]-Cardiac & Pulmonary Rehab    Staff Present  Jasper Loser BS, Exercise Physiologist;Jessica Luan Pulling, MA, RCEP, CCRP, Exercise Physiologist;Joseph Tessie Fass RCP,RRT,BSRT    Medication changes reported      No    Fall or balance concerns reported     No    Tobacco Cessation  No Change    Warm-up and Cool-down  Performed as group-led instruction    Resistance Training Performed  Yes    VAD Patient?  No    PAD/SET Patient?  No      Pain Assessment   Currently in Pain?  No/denies    Multiple Pain Sites  No          Social History   Tobacco Use  Smoking Status Former Smoker  . Packs/day: 0.50  . Years: 15.00  . Pack years: 7.50  . Types: Cigarettes  Smokeless Tobacco Never Used  Tobacco Comment   Has a 20-pack-year history, qutting in 1999    Goals Met:  Proper associated with RPD/PD & O2 Sat Independence with exercise equipment Exercise tolerated well Strength training completed today  Goals Unmet:  Not Applicable  Comments: Pt able to follow exercise prescription today without complaint.  Will continue to monitor for progression.    Dr. Emily Filbert is Medical Director for [place] and [facility].

## 2019-02-05 ENCOUNTER — Encounter: Payer: Medicare Other | Admitting: *Deleted

## 2019-02-05 DIAGNOSIS — I5032 Chronic diastolic (congestive) heart failure: Secondary | ICD-10-CM

## 2019-02-05 DIAGNOSIS — G4730 Sleep apnea, unspecified: Secondary | ICD-10-CM | POA: Diagnosis not present

## 2019-02-05 NOTE — Progress Notes (Signed)
Daily Session Note  Patient Details  Name: Betty [name] MRN: [id number] Date of Birth: [birth year] Referring Provider:     Pulmonary Rehab from 01/25/2019 in [facility] Cardiac and Pulmonary Rehab  Referring Provider  Derrill Kay      Encounter Date: 02/05/2019  Check In: Session Check In - 02/05/19 1221      Check-In   Supervising physician immediately available to respond to emergencies  [facility] immediately available ER MD    Physician(s)  Dr. Cherylann Banas and Clearnce Hasten    Location  [facility]-Cardiac & Pulmonary Rehab    Staff Present  Renita Papa, RN BSN;Jessica Luan Pulling, MA, RCEP, CCRP, Exercise Physiologist;Joseph Tessie Fass RCP,RRT,BSRT    Medication changes reported      No    Fall or balance concerns reported     No    Tobacco Cessation  No Change    Warm-up and Cool-down  Performed as group-led instruction    Resistance Training Performed  Yes    VAD Patient?  No    PAD/SET Patient?  No      Pain Assessment   Currently in Pain?  No/denies          Social History   Tobacco Use  Smoking Status Former Smoker  . Packs/day: 0.50  . Years: 15.00  . Pack years: 7.50  . Types: Cigarettes  Smokeless Tobacco Never Used  Tobacco Comment   Has a 20-pack-year history, qutting in 1999    Goals Met:  Proper associated with RPD/PD & O2 Sat Independence with exercise equipment Using PLB without cueing & demonstrates good technique Exercise tolerated well No report of cardiac concerns or symptoms Strength training completed today  Goals Unmet:  Not Applicable  Comments: Pt able to follow exercise prescription today without complaint.  Will continue to monitor for progression.    Dr. Emily Filbert is Medical Director for [place] and [facility].

## 2019-02-08 DIAGNOSIS — I5032 Chronic diastolic (congestive) heart failure: Secondary | ICD-10-CM

## 2019-02-08 DIAGNOSIS — G4730 Sleep apnea, unspecified: Secondary | ICD-10-CM | POA: Diagnosis not present

## 2019-02-08 NOTE — Progress Notes (Signed)
Daily Session Note  Patient Details  Name: Betty [name] MRN: [id number] Date of Birth: [birth year] Referring Provider:     Pulmonary Rehab from 01/25/2019 in [facility] Cardiac and Pulmonary Rehab  Referring Provider  Derrill Kay      Encounter Date: 02/08/2019  Check In:      Social History   Tobacco Use  Smoking Status Former Smoker  . Packs/day: 0.50  . Years: 15.00  . Pack years: 7.50  . Types: Cigarettes  Smokeless Tobacco Never Used  Tobacco Comment   Has a 20-pack-year history, qutting in 1999    Goals Met:  Proper associated with RPD/PD & O2 Sat Independence with exercise equipment Exercise tolerated well Strength training completed today  Goals Unmet:  Not Applicable  Comments: Pt able to follow exercise prescription today without complaint.  Will continue to monitor for progression. Reviewed home exercise with pt today.  Pt plans to consider [employer]/stretch at home  for exercise.  Reviewed THR, pulse, RPE, sign and symptoms, NTG use, and when to call 911 or MD.  Also discussed weather considerations and indoor options.  Pt voiced understanding.    Dr. Emily Filbert is Medical Director for [place] and [facility].

## 2019-02-10 ENCOUNTER — Encounter: Payer: Medicare Other | Admitting: *Deleted

## 2019-02-10 DIAGNOSIS — I5032 Chronic diastolic (congestive) heart failure: Secondary | ICD-10-CM

## 2019-02-10 DIAGNOSIS — G4730 Sleep apnea, unspecified: Secondary | ICD-10-CM | POA: Diagnosis not present

## 2019-02-10 NOTE — Progress Notes (Signed)
Daily Session Note  Patient Details  Name: Betty [name] MRN: [id number] Date of Birth: 1946/12/08 Referring Provider:     Pulmonary Rehab from 01/25/2019 in [facility] Cardiac and Pulmonary Rehab  Referring Provider  Derrill Kay      Encounter Date: 02/10/2019  Check In: Session Check In - 02/10/19 1133      Check-In   Supervising physician immediately available to respond to emergencies  [facility] immediately available ER MD    Physician(s)  Dr. Joni Fears and Jimmye Norman    Location  [facility]-Cardiac & Pulmonary Rehab    Staff Present  Renita Papa, RN BSN;Jessica Luan Pulling, MA, RCEP, CCRP, Exercise Physiologist;Joseph Tessie Fass RCP,RRT,BSRT    Medication changes reported      No    Fall or balance concerns reported     No    Tobacco Cessation  No Change    Warm-up and Cool-down  Performed as group-led instruction    Resistance Training Performed  Yes    VAD Patient?  No    PAD/SET Patient?  No      Pain Assessment   Currently in Pain?  No/denies          Social History   Tobacco Use  Smoking Status Former Smoker  . Packs/day: 0.50  . Years: 15.00  . Pack years: 7.50  . Types: Cigarettes  Smokeless Tobacco Never Used  Tobacco Comment   Has a 20-pack-year history, qutting in 1999    Goals Met:  Proper associated with RPD/PD & O2 Sat Independence with exercise equipment Using PLB without cueing & demonstrates good technique Exercise tolerated well No report of cardiac concerns or symptoms Strength training completed today  Goals Unmet:  Not Applicable  Comments: Pt able to follow exercise prescription today without complaint.  Will continue to monitor for progression.  Reviewed home exercise with pt today.  Pt plans to walk at home and use treadmill for exercise.  Reviewed THR, pulse, RPE, sign and symptoms, and when to call 911 or MD.  Also discussed weather considerations and indoor options.  Pt voiced understanding.   Dr. Emily Filbert is Medical Director for  [place] and [facility].

## 2019-02-15 DIAGNOSIS — G4730 Sleep apnea, unspecified: Secondary | ICD-10-CM | POA: Diagnosis not present

## 2019-02-15 DIAGNOSIS — I5032 Chronic diastolic (congestive) heart failure: Secondary | ICD-10-CM

## 2019-02-15 LAB — GLUCOSE, CAPILLARY: Glucose-Capillary: 231 mg/dL — ABNORMAL HIGH (ref 70–99)

## 2019-02-15 NOTE — Progress Notes (Signed)
Daily Session Note  Patient Details  Name: Betty [name] MRN: [id number] Date of Birth: February 21, 1946 Referring Provider:     Pulmonary Rehab from 01/25/2019 in [facility] Cardiac and Pulmonary Rehab  Referring Provider  Derrill Kay      Encounter Date: 02/15/2019  Check In:      Social History   Tobacco Use  Smoking Status Former Smoker  . Packs/day: 0.50  . Years: 15.00  . Pack years: 7.50  . Types: Cigarettes  Smokeless Tobacco Never Used  Tobacco Comment   Has a 20-pack-year history, qutting in 1999    Goals Met:  Independence with exercise equipment Exercise tolerated well No report of cardiac concerns or symptoms Strength training completed today  Goals Unmet:  Not Applicable  Comments: Pt able to follow exercise prescription today without complaint.  Will continue to monitor for progression.    Dr. Emily Filbert is Medical Director for [place] and [facility].

## 2019-02-19 DIAGNOSIS — I5032 Chronic diastolic (congestive) heart failure: Secondary | ICD-10-CM

## 2019-02-19 DIAGNOSIS — G4730 Sleep apnea, unspecified: Secondary | ICD-10-CM | POA: Diagnosis not present

## 2019-02-19 NOTE — Progress Notes (Signed)
Daily Session Note  Patient Details  Name: Betty [name] MRN: [id number] Date of Birth: Nov 05, 1946 Referring Provider:     Pulmonary Rehab from 01/25/2019 in [facility] Cardiac and Pulmonary Rehab  Referring Provider  Derrill Kay      Encounter Date: 02/19/2019  Check In: Session Check In - 02/19/19 1144      Check-In   Supervising physician immediately available to respond to emergencies  [facility] immediately available ER MD    Physician(s)  Drs. Cinda Quest and Kinsey.Precise    Location  [facility]-Cardiac & Pulmonary Rehab    Staff Present  Justin Mend Lorre Nick, [place], RCEP, CCRP, Exercise Physiologist;Meredith Sherryll Burger, RN BSN    Medication changes reported      No    Fall or balance concerns reported     No    Warm-up and Cool-down  Performed as group-led instruction    Resistance Training Performed  Yes    VAD Patient?  No      Pain Assessment   Currently in Pain?  No/denies          Social History   Tobacco Use  Smoking Status Former Smoker  . Packs/day: 0.50  . Years: 15.00  . Pack years: 7.50  . Types: Cigarettes  Smokeless Tobacco Never Used  Tobacco Comment   Has a 20-pack-year history, qutting in 1999    Goals Met:  Independence with exercise equipment Exercise tolerated well Personal goals reviewed No report of cardiac concerns or symptoms Strength training completed today  Goals Unmet:  Not Applicable  Comments: Pt able to follow exercise prescription today without complaint.  Will continue to monitor for progression.    Dr. Emily Filbert is Medical Director for [place] and [facility].

## 2019-02-22 ENCOUNTER — Encounter: Payer: Medicare Other | Attending: Internal Medicine

## 2019-02-22 DIAGNOSIS — G4730 Sleep apnea, unspecified: Secondary | ICD-10-CM | POA: Diagnosis not present

## 2019-02-22 DIAGNOSIS — J449 Chronic obstructive pulmonary disease, unspecified: Secondary | ICD-10-CM | POA: Diagnosis not present

## 2019-02-22 DIAGNOSIS — E119 Type 2 diabetes mellitus without complications: Secondary | ICD-10-CM | POA: Insufficient documentation

## 2019-02-22 DIAGNOSIS — Z87891 Personal history of nicotine dependence: Secondary | ICD-10-CM | POA: Insufficient documentation

## 2019-02-22 DIAGNOSIS — I5032 Chronic diastolic (congestive) heart failure: Secondary | ICD-10-CM | POA: Insufficient documentation

## 2019-02-22 DIAGNOSIS — I4891 Unspecified atrial fibrillation: Secondary | ICD-10-CM | POA: Insufficient documentation

## 2019-02-22 DIAGNOSIS — I110 Hypertensive heart disease with heart failure: Secondary | ICD-10-CM | POA: Diagnosis not present

## 2019-02-22 DIAGNOSIS — Z79899 Other long term (current) drug therapy: Secondary | ICD-10-CM | POA: Diagnosis not present

## 2019-02-22 DIAGNOSIS — Z7984 Long term (current) use of oral hypoglycemic drugs: Secondary | ICD-10-CM | POA: Insufficient documentation

## 2019-02-22 DIAGNOSIS — Z85828 Personal history of other malignant neoplasm of skin: Secondary | ICD-10-CM | POA: Insufficient documentation

## 2019-02-22 DIAGNOSIS — M1990 Unspecified osteoarthritis, unspecified site: Secondary | ICD-10-CM | POA: Diagnosis not present

## 2019-02-22 DIAGNOSIS — Z7901 Long term (current) use of anticoagulants: Secondary | ICD-10-CM | POA: Diagnosis not present

## 2019-02-22 DIAGNOSIS — K219 Gastro-esophageal reflux disease without esophagitis: Secondary | ICD-10-CM | POA: Insufficient documentation

## 2019-02-22 NOTE — Progress Notes (Signed)
Daily Session Note  Patient Details  Name: Betty [name] MRN: [id number] Date of Birth: 17-Nov-1946 Referring Provider:     Pulmonary Rehab from 01/25/2019 in [facility] Cardiac and Pulmonary Rehab  Referring Provider  Derrill Kay      Encounter Date: 02/22/2019  Check In:      Social History   Tobacco Use  Smoking Status Former Smoker  . Packs/day: 0.50  . Years: 15.00  . Pack years: 7.50  . Types: Cigarettes  Smokeless Tobacco Never Used  Tobacco Comment   Has a 20-pack-year history, qutting in 1999    Goals Met:  Proper associated with RPD/PD & O2 Sat Independence with exercise equipment Exercise tolerated well Strength training completed today  Goals Unmet:  Not Applicable  Comments: Pt able to follow exercise prescription today without complaint.  Will continue to monitor for progression.    Dr. Emily Filbert is Medical Director for [place] and [facility].

## 2019-02-26 ENCOUNTER — Emergency Department: Payer: Medicare Other

## 2019-02-26 ENCOUNTER — Emergency Department
Admission: EM | Admit: 2019-02-26 | Discharge: 2019-02-27 | Disposition: A | Discharge status: Home / Self Care | Payer: Medicare Other | Attending: Emergency Medicine | Admitting: Emergency Medicine

## 2019-02-26 ENCOUNTER — Other Ambulatory Visit: Payer: Self-pay

## 2019-02-26 DIAGNOSIS — Z7901 Long term (current) use of anticoagulants: Secondary | ICD-10-CM | POA: Insufficient documentation

## 2019-02-26 DIAGNOSIS — Y999 Unspecified external cause status: Secondary | ICD-10-CM | POA: Insufficient documentation

## 2019-02-26 DIAGNOSIS — S0003XA Contusion of scalp, initial encounter: Secondary | ICD-10-CM

## 2019-02-26 DIAGNOSIS — J449 Chronic obstructive pulmonary disease, unspecified: Secondary | ICD-10-CM | POA: Diagnosis not present

## 2019-02-26 DIAGNOSIS — S0990XA Unspecified injury of head, initial encounter: Secondary | ICD-10-CM | POA: Insufficient documentation

## 2019-02-26 DIAGNOSIS — Y939 Activity, unspecified: Secondary | ICD-10-CM | POA: Insufficient documentation

## 2019-02-26 DIAGNOSIS — I110 Hypertensive heart disease with heart failure: Secondary | ICD-10-CM | POA: Insufficient documentation

## 2019-02-26 DIAGNOSIS — W010XXA Fall on same level from slipping, tripping and stumbling without subsequent striking against object, initial encounter: Secondary | ICD-10-CM | POA: Diagnosis not present

## 2019-02-26 DIAGNOSIS — Z85828 Personal history of other malignant neoplasm of skin: Secondary | ICD-10-CM | POA: Diagnosis not present

## 2019-02-26 DIAGNOSIS — Y9259 Other trade areas as the place of occurrence of the external cause: Secondary | ICD-10-CM | POA: Insufficient documentation

## 2019-02-26 DIAGNOSIS — E1165 Type 2 diabetes mellitus with hyperglycemia: Secondary | ICD-10-CM | POA: Diagnosis not present

## 2019-02-26 DIAGNOSIS — N390 Urinary tract infection, site not specified: Secondary | ICD-10-CM | POA: Diagnosis not present

## 2019-02-26 DIAGNOSIS — J45909 Unspecified asthma, uncomplicated: Secondary | ICD-10-CM | POA: Diagnosis not present

## 2019-02-26 DIAGNOSIS — I5032 Chronic diastolic (congestive) heart failure: Secondary | ICD-10-CM | POA: Insufficient documentation

## 2019-02-26 DIAGNOSIS — R739 Hyperglycemia, unspecified: Secondary | ICD-10-CM

## 2019-02-26 DIAGNOSIS — W19XXXA Unspecified fall, initial encounter: Secondary | ICD-10-CM

## 2019-02-26 LAB — CBC WITH DIFFERENTIAL/PLATELET
Abs Immature Granulocytes: 0.04 10*3/uL (ref 0.00–0.07)
Basophils Absolute: 0 10*3/uL (ref 0.0–0.1)
Basophils Relative: 1 %
Eosinophils Absolute: 0.2 10*3/uL (ref 0.0–0.5)
Eosinophils Relative: 2 %
HCT: 41.3 % (ref 36.0–46.0)
Hemoglobin: 12.8 g/dL (ref 12.0–15.0)
Immature Granulocytes: 1 %
Lymphocytes Relative: 8 %
Lymphs Abs: 0.7 10*3/uL (ref 0.7–4.0)
MCH: 28.8 pg (ref 26.0–34.0)
MCHC: 31 g/dL (ref 30.0–36.0)
MCV: 93 fL (ref 80.0–100.0)
Monocytes Absolute: 0.9 10*3/uL (ref 0.1–1.0)
Monocytes Relative: 11 %
Neutro Abs: 6.7 10*3/uL (ref 1.7–7.7)
Neutrophils Relative %: 77 %
Platelets: 218 10*3/uL (ref 150–400)
RBC: 4.44 MIL/uL (ref 3.87–5.11)
RDW: 15.9 % — ABNORMAL HIGH (ref 11.5–15.5)
WBC: 8.7 10*3/uL (ref 4.0–10.5)
nRBC: 0 % (ref 0.0–0.2)

## 2019-02-26 LAB — COMPREHENSIVE METABOLIC PANEL
ALT: 14 U/L (ref 0–44)
AST: 35 U/L (ref 15–41)
Albumin: 4.1 g/dL (ref 3.5–5.0)
Alkaline Phosphatase: 105 U/L (ref 38–126)
Anion gap: 13 (ref 5–15)
BUN: 34 mg/dL — AB (ref 8–23)
CO2: 29 mmol/L (ref 22–32)
Calcium: 8.6 mg/dL — ABNORMAL LOW (ref 8.9–10.3)
Chloride: 91 mmol/L — ABNORMAL LOW (ref 98–111)
Creatinine, Ser: 1.3 mg/dL — ABNORMAL HIGH (ref 0.44–1.00)
GFR calc Af Amer: 47 mL/min — ABNORMAL LOW (ref >60)
GFR, EST NON AFRICAN AMERICAN: 41 mL/min — AB (ref >60)
Glucose, Bld: 275 mg/dL — ABNORMAL HIGH (ref 70–99)
Potassium: 3.4 mmol/L — ABNORMAL LOW (ref 3.5–5.1)
Sodium: 133 mmol/L — ABNORMAL LOW (ref 135–145)
Total Bilirubin: 1.2 mg/dL (ref 0.3–1.2)
Total Protein: 7 g/dL (ref 6.5–8.1)

## 2019-02-26 LAB — TROPONIN I: Troponin I: 0.03 ng/mL (ref <0.03)

## 2019-02-26 LAB — GLUCOSE, CAPILLARY: Glucose-Capillary: 280 mg/dL — ABNORMAL HIGH (ref 70–99)

## 2019-02-26 MED ORDER — SODIUM CHLORIDE 0.9 % IV BOLUS
500.0000 mL | Freq: Once | INTRAVENOUS | Status: AC
  Administered 2019-02-26: 500 mL via INTRAVENOUS

## 2019-02-26 NOTE — ED Triage Notes (Signed)
Pt to ED from hotel via ems where pt tripped and fell hitting front of head on carpet flooring pt states she takes coumadin. Denies headache or neck pain. Per EMS pts CBG was 508. Pt got 234ml NS with EMS. VSS. NAD.
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
## 2019-02-27 LAB — URINALYSIS, COMPLETE (UACMP) WITH MICROSCOPIC
Bilirubin Urine: NEGATIVE
Glucose, UA: 50 mg/dL — AB
Hgb urine dipstick: NEGATIVE
Ketones, ur: NEGATIVE mg/dL
NITRITE: NEGATIVE
PROTEIN: NEGATIVE mg/dL
Specific Gravity, Urine: 1.006 (ref 1.005–1.030)
pH: 7 (ref 5.0–8.0)

## 2019-02-27 LAB — TROPONIN I: Troponin I: 0.03 ng/mL (ref <0.03)

## 2019-02-27 LAB — PROTIME-INR
INR: 3.7 — ABNORMAL HIGH (ref 0.8–1.2)
Prothrombin Time: 35.8 seconds — ABNORMAL HIGH (ref 11.4–15.2)

## 2019-02-27 MED ORDER — FOSFOMYCIN TROMETHAMINE 3 G PO PACK
3.0000 g | PACK | Freq: Once | ORAL | Status: AC
  Administered 2019-02-27: 3 g via ORAL
  Filled 2019-02-27: qty 3

## 2019-02-27 MED ORDER — ROPINIROLE HCL 2 MG PO TABS: 1.0000 mg | ORAL_TABLET | Freq: Every day | ORAL | 0 refills | Status: DC

## 2019-02-27 NOTE — ED Notes (Signed)
Pt. Assisted to bathroom.  Pt. Bed sheet changed.  Pt. Given pair of blue scrubs and undergarment.

## 2019-02-27 NOTE — Discharge Instructions (Addendum)
Apply ice to affected area several times daily to reduce swelling.  Return to the ER for worsening symptoms, persistent vomiting, lethargy, difficulty breathing or other concerns.

## 2019-03-01 DIAGNOSIS — I5032 Chronic diastolic (congestive) heart failure: Secondary | ICD-10-CM

## 2019-03-01 NOTE — Progress Notes (Signed)
Pulmonary Individual Treatment Plan  Patient Details  Name: Betty [name] MRN: [id number] Date of Birth: [birth year] Referring Provider:     Pulmonary Rehab from 01/25/2019 in [facility] Cardiac and Pulmonary Rehab  Referring Provider  Derrill Kay      Initial Encounter Date:    Pulmonary Rehab from 01/25/2019 in [facility] Cardiac and Pulmonary Rehab  Date  01/25/19      Visit Diagnosis: Heart failure, diastolic, chronic ([place])  Patient's Home Medications on Admission:  Current Outpatient Medications:  .  acetaminophen (TYLENOL) 325 MG tablet, Take 2 tablets (650 mg total) by mouth every 6 (six) hours as needed for mild pain (or Fever >/= 101)., Disp: 30 tablet, Rfl: 0 .  atorvastatin (LIPITOR) 20 MG tablet, Take 20 mg by mouth at bedtime. , Disp: , Rfl:  .  cefdinir (OMNICEF) 300 MG capsule, Take 1 capsule (300 mg total) by mouth every 12 (twelve) hours. (Patient not taking: Reported on 01/25/2019), Disp: 4 capsule, Rfl: 0 .  ferrous sulfate 325 (65 FE) MG EC tablet, Take 325 mg by mouth daily. , Disp: , Rfl: 11 .  gabapentin (NEURONTIN) 300 MG capsule, Take 600 mg by mouth 3 (three) times daily. Take 2 capsules (627m) in morning, 1 capsule (3065m at midday, and 2 capsules (60068mat bedtime, Disp: , Rfl:  .  guaiFENesin-dextromethorphan (ROBITUSSIN DM) 100-10 MG/5ML syrup, Take 5 mLs by mouth every 4 (four) hours as needed for cough. (Patient not taking: Reported on 01/25/2019), Disp: 118 mL, Rfl: 0 .  levocetirizine (XYZAL) 5 MG tablet, Take 5 mg by mouth at bedtime., Disp: , Rfl:  .  metFORMIN (GLUCOPHAGE) 1000 MG tablet, Take 500 mg by mouth 2 (two) times daily with a meal. , Disp: , Rfl:  .  metolazone (ZAROXOLYN) 2.5 MG tablet, Take 2.5 mg by mouth as needed (fluid retention). Take once weekly as needed, Disp: , Rfl:  .  metoprolol succinate (TOPROL-XL) 50 MG 24 hr tablet, Take 50 mg by mouth daily., Disp: , Rfl:  .  pantoprazole (PROTONIX) 40 MG tablet, Take 40 mg by mouth daily., Disp: ,  Rfl:  .  potassium chloride (MICRO-K) 10 MEQ CR capsule, Take 20 mEq by mouth 2 (two) times daily., Disp: , Rfl:  .  rOPINIRole (REQUIP) 2 MG tablet, Take 0.5 tablets (1 mg total) by mouth at bedtime., Disp: 30 tablet, Rfl: 0 .  spironolactone (ALDACTONE) 50 MG tablet, Take 50 mg by mouth daily., Disp: , Rfl:  .  tiZANidine (ZANAFLEX) 2 MG tablet, Take 2 mg by mouth at bedtime. , Disp: , Rfl:  .  torsemide (DEMADEX) 20 MG tablet, Take 80 mg by mouth 2 (two) times daily., Disp: , Rfl:  .  traZODone (DESYREL) 50 MG tablet, Take 50 mg by mouth at bedtime. , Disp: , Rfl:  .  venlafaxine XR (EFFEXOR-XR) 150 MG 24 hr capsule, Take 150 mg by mouth daily., Disp: , Rfl:  .  Vitamin D, Ergocalciferol, (DRISDOL) 50000 units CAPS capsule, Take 50,000 Units by mouth every Tuesday. , Disp: , Rfl:  .  warfarin (COUMADIN) 3 MG tablet, Take 3 mg by mouth at bedtime. Takes with 4mg45mblet for total dose of 7mg,25msp: , Rfl:  .  warfarin (COUMADIN) 4 MG tablet, Take 4 mg by mouth at bedtime. Takes with 3mg t72met for total dose of 7mg, D35m: , Rfl:   Past Medical History: Past Medical History:  Diagnosis Date  . Acute diastolic heart failure (HCC)   [place]  Allergy   . 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 Asthma   . Atrial fibrillation ([place])   . Backache, unspecified   . Cancer ([place])    skin  . Cardiomegaly   . COPD (chronic obstructive pulmonary disease) ([place])   . Diabetes mellitus without complication ([place])   . 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 Esophageal reflux   . Headache(784.0)   . Herpes zoster without mention of complication   . Hx: UTI (urinary tract infection)   . Hypertension    heart controlled w CHF  . Nontoxic uninodular goiter   . Obesity, unspecified   . Osteoarthrosis, unspecified whether generalized or localized, unspecified site   . Unspecified sleep apnea   . Urine incontinence    hx of    Tobacco Use: Social History   Tobacco Use  Smoking  Status Former Smoker  . Packs/day: 0.50  . Years: 15.00  . Pack years: 7.50  . Types: Cigarettes  Smokeless Tobacco Never Used  Tobacco Comment   Has a 20-pack-year history, qutting in 1999    Labs: Recent Review Flowsheet Data    Labs for ITP Cardiac and Pulmonary Rehab Latest Ref Rng & Units 02/25/2018 02/26/2018 02/28/2018 10/30/2018 12/25/2018   Cholestrol 0 - 200 mg/dL - - - - -   LDLCALC 0 - 99 mg/dL - - - - -   HDL >39.00 mg/dL - - - - -   Trlycerides <150 mg/dL - - 82 - -   Hemoglobin A1c 4.8 - 5.6 % - - - - 6.7(H)   PHART 7.350 - 7.450 7.42 7.31(L) - 7.42 -   PCO2ART 32.0 - 48.0 mmHg 63(H) 84(HH) - 56(H) -   HCO3 20.0 - 28.0 mmol/L 40.9(H) 42.3(H) - 36.3(H) -   TCO2 0 - 100 mmol/L - - - - -   ACIDBASEDEF 0.0 - 2.0 mmol/L - - - - -   O2SAT % 99.5 93.7 - 98.7 -       Pulmonary Assessment Scores: Pulmonary Assessment Scores    Row Name 01/25/19 1504         ADL UCSD   ADL Phase  Entry     SOB Score total  73     Rest  0     Walk  3     Stairs  4     Bath  3     Dress  2     Shop  3       CAT Score   CAT Score  22       mMRC Score   mMRC Score  2        Pulmonary Function Assessment: Pulmonary Function Assessment - 01/25/19 1600      Breath   Bilateral Breath Sounds  Clear    Shortness of Breath  Yes;Limiting activity       Exercise Target Goals: Exercise Program Goal: Individual exercise prescription set using results from initial 6 min walk test and THRR while considering  patient's activity barriers and safety.   Exercise Prescription Goal: Initial exercise prescription builds to 30-45 minutes a day of aerobic activity, 2-3 days per week.  Home exercise guidelines will be given to patient during program as part of exercise prescription that the participant will acknowledge.  Activity Barriers & Risk Stratification: Activity Barriers & Cardiac Risk Stratification - 01/25/19 1621      Activity Barriers & Cardiac Risk Stratification   Activity  Barriers  Back Problems;Neck/Spine Problems;Deconditioning;Muscular Weakness;Shortness of Breath;Balance Concerns;History of Falls   Hx broken neck from fall, chronic back pain      6 Minute Walk: 6 Minute Walk    Row Name 01/25/19 1620         6 Minute Walk   Phase  Initial     Distance  295 feet     Walk Time  2.5 minutes     # of Rest Breaks  2 1:30, stopped at 39mn     MPH  1.34     METS  0.58     RPE  15     Perceived Dyspnea   3     VO2 Peak  2.03     Symptoms  Yes (comment)     Comments  SOB     Resting HR  77 bpm     Resting BP  104/62     Resting Oxygen Saturation   92 %     Exercise Oxygen Saturation  during 6 min walk  87 %     Max Ex. HR  88 bpm     Max Ex. BP  120/80     2 Minute Post BP  110/60       Interval HR   2 Minute HR  82     3 Minute HR  84     4 Minute HR  87     5 Minute HR  85     6 Minute HR  88     2 Minute Post HR  73     Interval Heart Rate?  Yes       Interval Oxygen   Interval Oxygen?  Yes     Baseline Oxygen Saturation %  92 %     2 Minute Oxygen Saturation %  87 %     2 Minute Liters of Oxygen  2 L pulsed     3 Minute Oxygen Saturation %  89 %     3 Minute Liters of Oxygen  2 L     4 Minute Oxygen Saturation %  89 %     4 Minute Liters of Oxygen  2 L     5 Minute Oxygen Saturation %  87 %     5 Minute Liters of Oxygen  2 L     6 Minute Oxygen Saturation %  89 %     6 Minute Liters of Oxygen  2 L     2 Minute Post Oxygen Saturation %  98 %     2 Minute Post Liters of Oxygen  2 L       Oxygen Initial Assessment: Oxygen Initial Assessment - 01/25/19 1540      Home Oxygen   Home Oxygen Device  Portable Concentrator;Home Concentrator    Sleep Oxygen Prescription  Continuous;CPAP    Liters per minute  2    Home Exercise Oxygen Prescription  Pulsed    Liters per minute  2    Home at Rest Exercise Oxygen Prescription  Continuous    Liters per minute  2      Initial 6 min Walk   Oxygen Used  Portable Concentrator;Pulsed     Liters per minute  2      Program Oxygen Prescription   Program Oxygen Prescription  Continuous;E-Tanks    Liters per minute  2      Intervention   Short Term Goals  To learn and exhibit  compliance with exercise, home and travel O2 prescription;To learn and understand importance of monitoring SPO2 with pulse oximeter and demonstrate accurate use of the pulse oximeter.;To learn and understand importance of maintaining oxygen saturations>88%;To learn and demonstrate proper pursed lip breathing techniques or other breathing techniques.;To learn and demonstrate proper use of respiratory medications    Long  Term Goals  Exhibits compliance with exercise, home and travel O2 prescription;Verbalizes importance of monitoring SPO2 with pulse oximeter and return demonstration;Exhibits proper breathing techniques, such as pursed lip breathing or other method taught during program session;Maintenance of O2 saturations>88%;Compliance with respiratory medication;Demonstrates proper use of MDI's       Oxygen Re-Evaluation: Oxygen Re-Evaluation    Row Name 01/29/19 1145 02/19/19 1216           Program Oxygen Prescription   Program Oxygen Prescription  -  Continuous;E-Tanks      Liters per minute  -  2        Home Oxygen   Home Oxygen Device  -  Portable Concentrator;Home Concentrator      Sleep Oxygen Prescription  -  Continuous;CPAP      Liters per minute  -  2      Home Exercise Oxygen Prescription  -  Pulsed      Liters per minute  -  2      Home at Rest Exercise Oxygen Prescription  -  Continuous      Liters per minute  -  2      Compliance with Home Oxygen Use  -  Yes        Goals/Expected Outcomes   Short Term Goals  To learn and exhibit compliance with exercise, home and travel O2 prescription;To learn and understand importance of monitoring SPO2 with pulse oximeter and demonstrate accurate use of the pulse oximeter.;To learn and understand importance of maintaining oxygen  saturations>88%;To learn and demonstrate proper pursed lip breathing techniques or other breathing techniques.  To learn and exhibit compliance with exercise, home and travel O2 prescription;To learn and understand importance of monitoring SPO2 with pulse oximeter and demonstrate accurate use of the pulse oximeter.;To learn and understand importance of maintaining oxygen saturations>88%;To learn and demonstrate proper pursed lip breathing techniques or other breathing techniques.      Long  Term Goals  Exhibits compliance with exercise, home and travel O2 prescription;Verbalizes importance of monitoring SPO2 with pulse oximeter and return demonstration;Maintenance of O2 saturations>88%;Exhibits proper breathing techniques, such as pursed lip breathing or other method taught during program session  Exhibits compliance with exercise, home and travel O2 prescription;Verbalizes importance of monitoring SPO2 with pulse oximeter and return demonstration;Maintenance of O2 saturations>88%;Exhibits proper breathing techniques, such as pursed lip breathing or other method taught during program session      Comments  Reviewed PLB technique with pt.  Talked about how it work and it's important to maintaining his exercise saturations.    Patient is using her PLB at home when her oxygen gets low and is checking her oxygen routinely. She states that she cannot hook up her oxygen tubing to her concentrator so sometimes she will go without oxygen. She has called the company that she got her concentrator from but they said they do not troubleshoot the machine. Informed patient of some troubleshooting option for her machine and if it does not work to plug in her portable concentrator and use that for oxygen until her concentrator is fixed. It sounds like the end of her tubing is cut and  she cannot hook it to the machine properly. Patient verbalizes understanding of trying to troubleshoot her concentrator.      Goals/Expected  Outcomes  Short: Become more profiecient at using PLB.   Long: Become independent at using PLB.  Short: fix home concentrator. Long; Maintain oxygen use and machines at home independently.         Oxygen Discharge (Final Oxygen Re-Evaluation): Oxygen Re-Evaluation - 02/19/19 1216      Program Oxygen Prescription   Program Oxygen Prescription  Continuous;E-Tanks    Liters per minute  2      Home Oxygen   Home Oxygen Device  Portable Concentrator;Home Concentrator    Sleep Oxygen Prescription  Continuous;CPAP    Liters per minute  2    Home Exercise Oxygen Prescription  Pulsed    Liters per minute  2    Home at Rest Exercise Oxygen Prescription  Continuous    Liters per minute  2    Compliance with Home Oxygen Use  Yes      Goals/Expected Outcomes   Short Term Goals  To learn and exhibit compliance with exercise, home and travel O2 prescription;To learn and understand importance of monitoring SPO2 with pulse oximeter and demonstrate accurate use of the pulse oximeter.;To learn and understand importance of maintaining oxygen saturations>88%;To learn and demonstrate proper pursed lip breathing techniques or other breathing techniques.    Long  Term Goals  Exhibits compliance with exercise, home and travel O2 prescription;Verbalizes importance of monitoring SPO2 with pulse oximeter and return demonstration;Maintenance of O2 saturations>88%;Exhibits proper breathing techniques, such as pursed lip breathing or other method taught during program session    Comments  Patient is using her PLB at home when her oxygen gets low and is checking her oxygen routinely. She states that she cannot hook up her oxygen tubing to her concentrator so sometimes she will go without oxygen. She has called the company that she got her concentrator from but they said they do not troubleshoot the machine. Informed patient of some troubleshooting option for her machine and if it does not work to plug in her portable  concentrator and use that for oxygen until her concentrator is fixed. It sounds like the end of her tubing is cut and she cannot hook it to the machine properly. Patient verbalizes understanding of trying to troubleshoot her concentrator.    Goals/Expected Outcomes  Short: fix home concentrator. Long; Maintain oxygen use and machines at home independently.       Initial Exercise Prescription: Initial Exercise Prescription - 01/25/19 1600      Date of Initial Exercise RX and Referring Provider   Date  01/25/19    Referring Provider  Derrill Kay      Oxygen   Oxygen  Continuous    Liters  2      Treadmill   MPH  1    Grade  0    Minutes  15    METs  1.77      NuStep   Level  1    SPM  80    Minutes  15    METs  1.5      Biostep-RELP   Level  1    SPM  50    Minutes  15    METs  1      Prescription Details   Frequency (times per week)  3    Duration  Progress to 45 minutes of aerobic exercise without signs/symptoms of physical  distress      Intensity   THRR 40-80% of Max Heartrate  105-134    Ratings of Perceived Exertion  11-13    Perceived Dyspnea  0-4      Progression   Progression  Continue to progress workloads to maintain intensity without signs/symptoms of physical distress.      Resistance Training   Training Prescription  Yes    Weight  3 lbs    Reps  10-15       Perform Capillary Blood Glucose checks as needed.  Exercise Prescription Changes: Exercise Prescription Changes    Row Name 01/25/19 1600 02/02/19 1200 02/10/19 1200 02/16/19 1400       Response to Exercise   Blood Pressure (Admit)  104/62  126/74  -  126/64    Blood Pressure (Exercise)  120/80  128/70  -  -    Blood Pressure (Exit)  110/60  146/70  -  102/70    Heart Rate (Admit)  77 bpm  90 bpm  -  92 bpm    Heart Rate (Exercise)  88 bpm  84 bpm  -  98 bpm    Heart Rate (Exit)  73 bpm  89 bpm  -  80 bpm    Oxygen Saturation (Admit)  92 %  97 %  -  88 %    Oxygen Saturation  (Exercise)  87 %  85 %  -  84 %    Oxygen Saturation (Exit)  98 %  95 %  -  98 %    Rating of Perceived Exertion (Exercise)  15  13  -  13    Perceived Dyspnea (Exercise)  3  3  -  3    Symptoms  SOB  SOB  -  dizzy during exercise    Comments  walk test results  first full day of exercies  -  -    Duration  -  Progress to 45 minutes of aerobic exercise without signs/symptoms of physical distress  -  Continue with 45 min of aerobic exercise without signs/symptoms of physical distress.    Intensity  -  THRR unchanged  -  THRR unchanged      Progression   Progression  -  Continue to progress workloads to maintain intensity without signs/symptoms of physical distress.  -  Continue to progress workloads to maintain intensity without signs/symptoms of physical distress.    Average METs  -  2.02  -  2.16      Resistance Training   Training Prescription  -  Yes  -  Yes    Weight  -  3 lbs  -  3 lbs    Reps  -  10-15  -  10-15      Interval Training   Interval Training  -  No  -  No      Oxygen   Oxygen  -  Continuous  -  Continuous    Liters  -  2  -  2-3      Treadmill   MPH  -  1  -  1.3    Grade  -  0  -  0.5    Minutes  -  15  -  15    METs  -  1.77  -  2.09      NuStep   Level  -  1  -  3    Minutes  -  15  -  15    METs  -  2.3  -  2.4      Biostep-RELP   Level  -  1  -  1    Minutes  -  15  -  15    METs  -  2  -  2      Home Exercise Plan   Plans to continue exercise at  -  -  Home (comment) walking, treadmill  Home (comment) walking, treadmill    Frequency  -  -  Add 2 additional days to program exercise sessions.  Add 2 additional days to program exercise sessions.    Initial Home Exercises Provided  -  -  02/10/19  02/10/19       Exercise Comments: Exercise Comments    Row Name 01/29/19 1144 02/08/19 1240         Exercise Comments   First full day of exercise!  Patient was oriented to gym and equipment including functions, settings, policies, and procedures.   Patient's individual exercise prescription and treatment plan were reviewed.  All starting workloads were established based on the results of the 6 minute walk test done at initial orientation visit.  The plan for exercise progression was also introduced and progression will be customized based on patient's performance and goals.  Pt able to follow exercise prescription today without complaint.  Will continue to monitor for progression.         Exercise Goals and Review: Exercise Goals    Row Name 01/25/19 1624             Exercise Goals   Increase Physical Activity  Yes       Intervention  Provide advice, education, support and counseling about physical activity/exercise needs.;Develop an individualized exercise prescription for aerobic and resistive training based on initial evaluation findings, risk stratification, comorbidities and participant's personal goals.       Expected Outcomes  Short Term: Attend rehab on a regular basis to increase amount of physical activity.;Long Term: Add in home exercise to make exercise part of routine and to increase amount of physical activity.;Long Term: Exercising regularly at least 3-5 days a week.       Increase Strength and Stamina  Yes       Intervention  Provide advice, education, support and counseling about physical activity/exercise needs.;Develop an individualized exercise prescription for aerobic and resistive training based on initial evaluation findings, risk stratification, comorbidities and participant's personal goals.       Expected Outcomes  Short Term: Increase workloads from initial exercise prescription for resistance, speed, and METs.;Short Term: Perform resistance training exercises routinely during rehab and add in resistance training at home;Long Term: Improve cardiorespiratory fitness, muscular endurance and strength as measured by increased METs and functional capacity (6MWT)       Able to understand and use rate of perceived  exertion (RPE) scale  Yes       Intervention  Provide education and explanation on how to use RPE scale       Expected Outcomes  Short Term: Able to use RPE daily in rehab to express subjective intensity level;Long Term:  Able to use RPE to guide intensity level when exercising independently       Able to understand and use Dyspnea scale  Yes       Intervention  Provide education and explanation on how to use Dyspnea scale       Expected Outcomes  Long Term: Able to use  Dyspnea scale to guide intensity level when exercising independently;Short Term: Able to use Dyspnea scale daily in rehab to express subjective sense of shortness of breath during exertion       Knowledge and understanding of Target Heart Rate Range (THRR)  Yes       Intervention  Provide education and explanation of THRR including how the numbers were predicted and where they are located for reference       Expected Outcomes  Short Term: Able to state/look up THRR;Short Term: Able to use daily as guideline for intensity in rehab;Long Term: Able to use THRR to govern intensity when exercising independently       Able to check pulse independently  Yes       Intervention  Provide education and demonstration on how to check pulse in carotid and radial arteries.;Review the importance of being able to check your own pulse for safety during independent exercise       Expected Outcomes  Short Term: Able to explain why pulse checking is important during independent exercise;Long Term: Able to check pulse independently and accurately       Understanding of Exercise Prescription  Yes       Intervention  Provide education, explanation, and written materials on patient's individual exercise prescription       Expected Outcomes  Short Term: Able to explain program exercise prescription;Long Term: Able to explain home exercise prescription to exercise independently          Exercise Goals Re-Evaluation : Exercise Goals Re-Evaluation    Row Name  01/29/19 1144 02/02/19 1236 02/08/19 1240 02/10/19 1234 02/16/19 1448     Exercise Goal Re-Evaluation   Exercise Goals Review  Increase Physical Activity;Increase Strength and Stamina;Able to understand and use rate of perceived exertion (RPE) scale;Knowledge and understanding of Target Heart Rate Range (THRR);Understanding of Exercise Prescription  Increase Physical Activity;Increase Strength and Stamina;Understanding of Exercise Prescription  Increase Physical Activity;Able to understand and use rate of perceived exertion (RPE) scale;Knowledge and understanding of Target Heart Rate Range (THRR);Understanding of Exercise Prescription;Increase Strength and Stamina;Able to understand and use Dyspnea scale;Able to check pulse independently  Increase Physical Activity;Able to understand and use rate of perceived exertion (RPE) scale;Knowledge and understanding of Target Heart Rate Range (THRR);Understanding of Exercise Prescription;Increase Strength and Stamina;Able to check pulse independently  Increase Strength and Stamina;Increase Physical Activity;Understanding of Exercise Prescription   Comments  Reviewed RPE scale, THR and program prescription with pt today.  Pt voiced understanding and was given a copy of goals to take home.   Sascha has completed one full day of exercise as her second day, her blood sugars ran high and she met with counselor .  We will continue to monitor her progress.   Pt able to follow exercise prescription today without complaint.  Will continue to monitor for progression.  Reviewed home exercise with pt today.  Pt plans to walk at home and use treadmill for exercise.  Reviewed THR, pulse, RPE, sign and symptoms, and when to call 911 or MD.  Also discussed weather considerations and indoor options.  Pt voiced understanding.  Semiyah is doing well in rehab.  She is on level 3 on the NuStep and should be ready to start to increase workloads if she does not continue to have dizzy spells.  We  will continue to monitor her progression.    Expected Outcomes  Short: Use RPE daily to regulate intensity. Long: Follow program prescription in THR.  Short: Attend  class regularly.  Long: Continue to increase strength and stamina.    Short - add stretching and walk at home one day per week Long - maintain exercise in [place]: Add in one extra day at home walking.  Long: Continue to walk independently  Short: Increase BioStep workloads.  Long: Continue to improve strength and stamina       Discharge Exercise Prescription (Final Exercise Prescription Changes): Exercise Prescription Changes - 02/16/19 1400      Response to Exercise   Blood Pressure (Admit)  126/64    Blood Pressure (Exit)  102/70    Heart Rate (Admit)  92 bpm    Heart Rate (Exercise)  98 bpm    Heart Rate (Exit)  80 bpm    Oxygen Saturation (Admit)  88 %    Oxygen Saturation (Exercise)  84 %    Oxygen Saturation (Exit)  98 %    Rating of Perceived Exertion (Exercise)  13    Perceived Dyspnea (Exercise)  3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

## 2019-03-15 DIAGNOSIS — I5032 Chronic diastolic (congestive) heart failure: Secondary | ICD-10-CM

## 2019-03-29 ENCOUNTER — Encounter: Payer: Self-pay | Admitting: *Deleted

## 2019-03-29 DIAGNOSIS — I5032 Chronic diastolic (congestive) heart failure: Secondary | ICD-10-CM

## 2019-03-29 NOTE — Progress Notes (Signed)
Pulmonary Individual Treatment Plan  Patient Details  Name: Betty [name] MRN: [id number] Date of Birth: [birth year] Referring Provider:     Pulmonary Rehab from 01/25/2019 in [facility] Cardiac and Pulmonary Rehab  Referring Provider  Derrill Kay      Initial Encounter Date:    Pulmonary Rehab from 01/25/2019 in [facility] Cardiac and Pulmonary Rehab  Date  01/25/19      Visit Diagnosis: Heart failure, diastolic, chronic ([place])  Patient's Home Medications on Admission:  Current Outpatient Medications:  .  acetaminophen (TYLENOL) 325 MG tablet, Take 2 tablets (650 mg total) by mouth every 6 (six) hours as needed for mild pain (or Fever >/= 101)., Disp: 30 tablet, Rfl: 0 .  atorvastatin (LIPITOR) 20 MG tablet, Take 20 mg by mouth at bedtime. , Disp: , Rfl:  .  cefdinir (OMNICEF) 300 MG capsule, Take 1 capsule (300 mg total) by mouth every 12 (twelve) hours. (Patient not taking: Reported on 01/25/2019), Disp: 4 capsule, Rfl: 0 .  ferrous sulfate 325 (65 FE) MG EC tablet, Take 325 mg by mouth daily. , Disp: , Rfl: 11 .  gabapentin (NEURONTIN) 300 MG capsule, Take 600 mg by mouth 3 (three) times daily. Take 2 capsules (627m) in morning, 1 capsule (3065m at midday, and 2 capsules (60068mat bedtime, Disp: , Rfl:  .  guaiFENesin-dextromethorphan (ROBITUSSIN DM) 100-10 MG/5ML syrup, Take 5 mLs by mouth every 4 (four) hours as needed for cough. (Patient not taking: Reported on 01/25/2019), Disp: 118 mL, Rfl: 0 .  levocetirizine (XYZAL) 5 MG tablet, Take 5 mg by mouth at bedtime., Disp: , Rfl:  .  metFORMIN (GLUCOPHAGE) 1000 MG tablet, Take 500 mg by mouth 2 (two) times daily with a meal. , Disp: , Rfl:  .  metolazone (ZAROXOLYN) 2.5 MG tablet, Take 2.5 mg by mouth as needed (fluid retention). Take once weekly as needed, Disp: , Rfl:  .  metoprolol succinate (TOPROL-XL) 50 MG 24 hr tablet, Take 50 mg by mouth daily., Disp: , Rfl:  .  pantoprazole (PROTONIX) 40 MG tablet, Take 40 mg by mouth daily., Disp: ,  Rfl:  .  potassium chloride (MICRO-K) 10 MEQ CR capsule, Take 20 mEq by mouth 2 (two) times daily., Disp: , Rfl:  .  rOPINIRole (REQUIP) 2 MG tablet, Take 0.5 tablets (1 mg total) by mouth at bedtime., Disp: 30 tablet, Rfl: 0 .  spironolactone (ALDACTONE) 50 MG tablet, Take 50 mg by mouth daily., Disp: , Rfl:  .  tiZANidine (ZANAFLEX) 2 MG tablet, Take 2 mg by mouth at bedtime. , Disp: , Rfl:  .  torsemide (DEMADEX) 20 MG tablet, Take 80 mg by mouth 2 (two) times daily., Disp: , Rfl:  .  traZODone (DESYREL) 50 MG tablet, Take 50 mg by mouth at bedtime. , Disp: , Rfl:  .  venlafaxine XR (EFFEXOR-XR) 150 MG 24 hr capsule, Take 150 mg by mouth daily., Disp: , Rfl:  .  Vitamin D, Ergocalciferol, (DRISDOL) 50000 units CAPS capsule, Take 50,000 Units by mouth every Tuesday. , Disp: , Rfl:  .  warfarin (COUMADIN) 3 MG tablet, Take 3 mg by mouth at bedtime. Takes with 4mg45mblet for total dose of 7mg,25msp: , Rfl:  .  warfarin (COUMADIN) 4 MG tablet, Take 4 mg by mouth at bedtime. Takes with 3mg t72met for total dose of 7mg, D35m: , Rfl:   Past Medical History: Past Medical History:  Diagnosis Date  . Acute diastolic heart failure (HCC)   [place]  Allergy   . 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 Asthma   . Atrial fibrillation ([place])   . Backache, unspecified   . Cancer ([place])    skin  . Cardiomegaly   . COPD (chronic obstructive pulmonary disease) ([place])   . Diabetes mellitus without complication ([place])   . 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 Esophageal reflux   . Headache(784.0)   . Herpes zoster without mention of complication   . Hx: UTI (urinary tract infection)   . Hypertension    heart controlled w CHF  . Nontoxic uninodular goiter   . Obesity, unspecified   . Osteoarthrosis, unspecified whether generalized or localized, unspecified site   . Unspecified sleep apnea   . Urine incontinence    hx of    Tobacco Use: Social History   Tobacco Use  Smoking  Status Former Smoker  . Packs/day: 0.50  . Years: 15.00  . Pack years: 7.50  . Types: Cigarettes  Smokeless Tobacco Never Used  Tobacco Comment   Has a 20-pack-year history, qutting in 1999    Labs: Recent Review Flowsheet Data    Labs for ITP Cardiac and Pulmonary Rehab Latest Ref Rng & Units 02/25/2018 02/26/2018 02/28/2018 10/30/2018 12/25/2018   Cholestrol 0 - 200 mg/dL - - - - -   LDLCALC 0 - 99 mg/dL - - - - -   HDL >39.00 mg/dL - - - - -   Trlycerides <150 mg/dL - - 82 - -   Hemoglobin A1c 4.8 - 5.6 % - - - - 6.7(H)   PHART 7.350 - 7.450 7.42 7.31(L) - 7.42 -   PCO2ART 32.0 - 48.0 mmHg 63(H) 84(HH) - 56(H) -   HCO3 20.0 - 28.0 mmol/L 40.9(H) 42.3(H) - 36.3(H) -   TCO2 0 - 100 mmol/L - - - - -   ACIDBASEDEF 0.0 - 2.0 mmol/L - - - - -   O2SAT % 99.5 93.7 - 98.7 -       Pulmonary Assessment Scores: Pulmonary Assessment Scores    Row Name 01/25/19 1504         ADL UCSD   ADL Phase  Entry     SOB Score total  73     Rest  0     Walk  3     Stairs  4     Bath  3     Dress  2     Shop  3       CAT Score   CAT Score  22       mMRC Score   mMRC Score  2        Pulmonary Function Assessment: Pulmonary Function Assessment - 01/25/19 1600      Breath   Bilateral Breath Sounds  Clear    Shortness of Breath  Yes;Limiting activity       Exercise Target Goals: Exercise Program Goal: Individual exercise prescription set using results from initial 6 min walk test and THRR while considering  patient's activity barriers and safety.   Exercise Prescription Goal: Initial exercise prescription builds to 30-45 minutes a day of aerobic activity, 2-3 days per week.  Home exercise guidelines will be given to patient during program as part of exercise prescription that the participant will acknowledge.  Activity Barriers & Risk Stratification: Activity Barriers & Cardiac Risk Stratification - 01/25/19 1621      Activity Barriers & Cardiac Risk Stratification   Activity  Barriers  Back Problems;Neck/Spine Problems;Deconditioning;Muscular Weakness;Shortness of Breath;Balance Concerns;History of Falls   Hx broken neck from fall, chronic back pain      6 Minute Walk: 6 Minute Walk    Row Name 01/25/19 1620         6 Minute Walk   Phase  Initial     Distance  295 feet     Walk Time  2.5 minutes     # of Rest Breaks  2 1:30, stopped at 28mn     MPH  1.34     METS  0.58     RPE  15     Perceived Dyspnea   3     VO2 Peak  2.03     Symptoms  Yes (comment)     Comments  SOB     Resting HR  77 bpm     Resting BP  104/62     Resting Oxygen Saturation   92 %     Exercise Oxygen Saturation  during 6 min walk  87 %     Max Ex. HR  88 bpm     Max Ex. BP  120/80     2 Minute Post BP  110/60       Interval HR   2 Minute HR  82     3 Minute HR  84     4 Minute HR  87     5 Minute HR  85     6 Minute HR  88     2 Minute Post HR  73     Interval Heart Rate?  Yes       Interval Oxygen   Interval Oxygen?  Yes     Baseline Oxygen Saturation %  92 %     2 Minute Oxygen Saturation %  87 %     2 Minute Liters of Oxygen  2 L pulsed     3 Minute Oxygen Saturation %  89 %     3 Minute Liters of Oxygen  2 L     4 Minute Oxygen Saturation %  89 %     4 Minute Liters of Oxygen  2 L     5 Minute Oxygen Saturation %  87 %     5 Minute Liters of Oxygen  2 L     6 Minute Oxygen Saturation %  89 %     6 Minute Liters of Oxygen  2 L     2 Minute Post Oxygen Saturation %  98 %     2 Minute Post Liters of Oxygen  2 L       Oxygen Initial Assessment: Oxygen Initial Assessment - 01/25/19 1540      Home Oxygen   Home Oxygen Device  Portable Concentrator;Home Concentrator    Sleep Oxygen Prescription  Continuous;CPAP    Liters per minute  2    Home Exercise Oxygen Prescription  Pulsed    Liters per minute  2    Home at Rest Exercise Oxygen Prescription  Continuous    Liters per minute  2      Initial 6 min Walk   Oxygen Used  Portable Concentrator;Pulsed     Liters per minute  2      Program Oxygen Prescription   Program Oxygen Prescription  Continuous;E-Tanks    Liters per minute  2      Intervention   Short Term Goals  To learn and exhibit  compliance with exercise, home and travel O2 prescription;To learn and understand importance of monitoring SPO2 with pulse oximeter and demonstrate accurate use of the pulse oximeter.;To learn and understand importance of maintaining oxygen saturations>88%;To learn and demonstrate proper pursed lip breathing techniques or other breathing techniques.;To learn and demonstrate proper use of respiratory medications    Long  Term Goals  Exhibits compliance with exercise, home and travel O2 prescription;Verbalizes importance of monitoring SPO2 with pulse oximeter and return demonstration;Exhibits proper breathing techniques, such as pursed lip breathing or other method taught during program session;Maintenance of O2 saturations>88%;Compliance with respiratory medication;Demonstrates proper use of MDI's       Oxygen Re-Evaluation: Oxygen Re-Evaluation    Row Name 01/29/19 1145 02/19/19 1216           Program Oxygen Prescription   Program Oxygen Prescription  -  Continuous;E-Tanks      Liters per minute  -  2        Home Oxygen   Home Oxygen Device  -  Portable Concentrator;Home Concentrator      Sleep Oxygen Prescription  -  Continuous;CPAP      Liters per minute  -  2      Home Exercise Oxygen Prescription  -  Pulsed      Liters per minute  -  2      Home at Rest Exercise Oxygen Prescription  -  Continuous      Liters per minute  -  2      Compliance with Home Oxygen Use  -  Yes        Goals/Expected Outcomes   Short Term Goals  To learn and exhibit compliance with exercise, home and travel O2 prescription;To learn and understand importance of monitoring SPO2 with pulse oximeter and demonstrate accurate use of the pulse oximeter.;To learn and understand importance of maintaining oxygen  saturations>88%;To learn and demonstrate proper pursed lip breathing techniques or other breathing techniques.  To learn and exhibit compliance with exercise, home and travel O2 prescription;To learn and understand importance of monitoring SPO2 with pulse oximeter and demonstrate accurate use of the pulse oximeter.;To learn and understand importance of maintaining oxygen saturations>88%;To learn and demonstrate proper pursed lip breathing techniques or other breathing techniques.      Long  Term Goals  Exhibits compliance with exercise, home and travel O2 prescription;Verbalizes importance of monitoring SPO2 with pulse oximeter and return demonstration;Maintenance of O2 saturations>88%;Exhibits proper breathing techniques, such as pursed lip breathing or other method taught during program session  Exhibits compliance with exercise, home and travel O2 prescription;Verbalizes importance of monitoring SPO2 with pulse oximeter and return demonstration;Maintenance of O2 saturations>88%;Exhibits proper breathing techniques, such as pursed lip breathing or other method taught during program session      Comments  Reviewed PLB technique with pt.  Talked about how it work and it's important to maintaining his exercise saturations.    Patient is using her PLB at home when her oxygen gets low and is checking her oxygen routinely. She states that she cannot hook up her oxygen tubing to her concentrator so sometimes she will go without oxygen. She has called the company that she got her concentrator from but they said they do not troubleshoot the machine. Informed patient of some troubleshooting option for her machine and if it does not work to plug in her portable concentrator and use that for oxygen until her concentrator is fixed. It sounds like the end of her tubing is cut and  she cannot hook it to the machine properly. Patient verbalizes understanding of trying to troubleshoot her concentrator.      Goals/Expected  Outcomes  Short: Become more profiecient at using PLB.   Long: Become independent at using PLB.  Short: fix home concentrator. Long; Maintain oxygen use and machines at home independently.         Oxygen Discharge (Final Oxygen Re-Evaluation): Oxygen Re-Evaluation - 02/19/19 1216      Program Oxygen Prescription   Program Oxygen Prescription  Continuous;E-Tanks    Liters per minute  2      Home Oxygen   Home Oxygen Device  Portable Concentrator;Home Concentrator    Sleep Oxygen Prescription  Continuous;CPAP    Liters per minute  2    Home Exercise Oxygen Prescription  Pulsed    Liters per minute  2    Home at Rest Exercise Oxygen Prescription  Continuous    Liters per minute  2    Compliance with Home Oxygen Use  Yes      Goals/Expected Outcomes   Short Term Goals  To learn and exhibit compliance with exercise, home and travel O2 prescription;To learn and understand importance of monitoring SPO2 with pulse oximeter and demonstrate accurate use of the pulse oximeter.;To learn and understand importance of maintaining oxygen saturations>88%;To learn and demonstrate proper pursed lip breathing techniques or other breathing techniques.    Long  Term Goals  Exhibits compliance with exercise, home and travel O2 prescription;Verbalizes importance of monitoring SPO2 with pulse oximeter and return demonstration;Maintenance of O2 saturations>88%;Exhibits proper breathing techniques, such as pursed lip breathing or other method taught during program session    Comments  Patient is using her PLB at home when her oxygen gets low and is checking her oxygen routinely. She states that she cannot hook up her oxygen tubing to her concentrator so sometimes she will go without oxygen. She has called the company that she got her concentrator from but they said they do not troubleshoot the machine. Informed patient of some troubleshooting option for her machine and if it does not work to plug in her portable  concentrator and use that for oxygen until her concentrator is fixed. It sounds like the end of her tubing is cut and she cannot hook it to the machine properly. Patient verbalizes understanding of trying to troubleshoot her concentrator.    Goals/Expected Outcomes  Short: fix home concentrator. Long; Maintain oxygen use and machines at home independently.       Initial Exercise Prescription: Initial Exercise Prescription - 01/25/19 1600      Date of Initial Exercise RX and Referring Provider   Date  01/25/19    Referring Provider  Derrill Kay      Oxygen   Oxygen  Continuous    Liters  2      Treadmill   MPH  1    Grade  0    Minutes  15    METs  1.77      NuStep   Level  1    SPM  80    Minutes  15    METs  1.5      Biostep-RELP   Level  1    SPM  50    Minutes  15    METs  1      Prescription Details   Frequency (times per week)  3    Duration  Progress to 45 minutes of aerobic exercise without signs/symptoms of physical  distress      Intensity   THRR 40-80% of Max Heartrate  105-134    Ratings of Perceived Exertion  11-13    Perceived Dyspnea  0-4      Progression   Progression  Continue to progress workloads to maintain intensity without signs/symptoms of physical distress.      Resistance Training   Training Prescription  Yes    Weight  3 lbs    Reps  10-15       Perform Capillary Blood Glucose checks as needed.  Exercise Prescription Changes: Exercise Prescription Changes    Row Name 01/25/19 1600 02/02/19 1200 02/10/19 1200 02/16/19 1400 03/01/19 1600     Response to Exercise   Blood Pressure (Admit)  104/62  126/74  -  126/64  120/66   Blood Pressure (Exercise)  120/80  128/70  -  -  -   Blood Pressure (Exit)  110/60  146/70  -  102/70  110/62   Heart Rate (Admit)  77 bpm  90 bpm  -  92 bpm  72 bpm   Heart Rate (Exercise)  88 bpm  84 bpm  -  98 bpm  83 bpm   Heart Rate (Exit)  73 bpm  89 bpm  -  80 bpm  71 bpm   Oxygen Saturation (Admit)   92 %  97 %  -  88 %  88 %   Oxygen Saturation (Exercise)  87 %  85 %  -  84 %  93 %   Oxygen Saturation (Exit)  98 %  95 %  -  98 %  98 %   Rating of Perceived Exertion (Exercise)  15  13  -  13  15   Perceived Dyspnea (Exercise)  3  3  -  3  3   Symptoms  SOB  SOB  -  dizzy during exercise  SOB   Comments  walk test results  first full day of exercies  -  -  -   Duration  -  Progress to 45 minutes of aerobic exercise without signs/symptoms of physical distress  -  Continue with 45 min of aerobic exercise without signs/symptoms of physical distress.  Continue with 45 min of aerobic exercise without signs/symptoms of physical distress.   Intensity  -  THRR unchanged  -  THRR unchanged  THRR unchanged     Progression   Progression  -  Continue to progress workloads to maintain intensity without signs/symptoms of physical distress.  -  Continue to progress workloads to maintain intensity without signs/symptoms of physical distress.  Continue to progress workloads to maintain intensity without signs/symptoms of physical distress.   Average METs  -  2.02  -  2.16  2.13     Resistance Training   Training Prescription  -  Yes  -  Yes  Yes   Weight  -  3 lbs  -  3 lbs  3 lbs   Reps  -  10-15  -  10-15  10-15     Interval Training   Interval Training  -  No  -  No  No     Oxygen   Oxygen  -  Continuous  -  Continuous  Continuous   Liters  -  2  -  2-3  2-3     Treadmill   MPH  -  1  -  1.3  1.3   Grade  -  0  -  0.5  0.5   Minutes  -  15  -  15  15   METs  -  1.77  -  2.09  2.09     NuStep   Level  -  1  -  3  3   Minutes  -  15  -  15  15   METs  -  2.3  -  2.4  2.3     Biostep-RELP   Level  -  1  -  1  1   Minutes  -  15  -  15  15   METs  -  2  -  2  2     Home Exercise Plan   Plans to continue exercise at  -  -  Home (comment) walking, treadmill  Home (comment) walking, treadmill  Home (comment) walking, treadmill   Frequency  -  -  Add 2 additional days to program exercise  sessions.  Add 2 additional days to program exercise sessions.  Add 2 additional days to program exercise sessions.   Initial Home Exercises Provided  -  -  02/10/19  02/10/19  02/10/19      Exercise Comments: Exercise Comments    Row Name 01/29/19 1144 02/08/19 1240         Exercise Comments   First full day of exercise!  Patient was oriented to gym and equipment including functions, settings, policies, and procedures.  Patient's individual exercise prescription and treatment plan were reviewed.  All starting workloads were established based on the results of the 6 minute walk test done at initial orientation visit.  The plan for exercise progression was also introduced and progression will be customized based on patient's performance and goals.  Pt able to follow exercise prescription today without complaint.  Will continue to monitor for progression.         Exercise Goals and Review: Exercise Goals    Row Name 01/25/19 1624             Exercise Goals   Increase Physical Activity  Yes       Intervention  Provide advice, education, support and counseling about physical activity/exercise needs.;Develop an individualized exercise prescription for aerobic and resistive training based on initial evaluation findings, risk stratification, comorbidities and participant's personal goals.       Expected Outcomes  Short Term: Attend rehab on a regular basis to increase amount of physical activity.;Long Term: Add in home exercise to make exercise part of routine and to increase amount of physical activity.;Long Term: Exercising regularly at least 3-5 days a week.       Increase Strength and Stamina  Yes       Intervention  Provide advice, education, support and counseling about physical activity/exercise needs.;Develop an individualized exercise prescription for aerobic and resistive training based on initial evaluation findings, risk stratification, comorbidities and participant's personal goals.        Expected Outcomes  Short Term: Increase workloads from initial exercise prescription for resistance, speed, and METs.;Short Term: Perform resistance training exercises routinely during rehab and add in resistance training at home;Long Term: Improve cardiorespiratory fitness, muscular endurance and strength as measured by increased METs and functional capacity (6MWT)       Able to understand and use rate of perceived exertion (RPE) scale  Yes       Intervention  Provide education and explanation on how to use RPE scale       Expected Outcomes  Short Term: Able to use RPE daily in rehab to express subjective intensity level;Long Term:  Able to use RPE to guide intensity level when exercising independently       Able to understand and use Dyspnea scale  Yes       Intervention  Provide education and explanation on how to use Dyspnea scale       Expected Outcomes  Long Term: Able to use Dyspnea scale to guide intensity level when exercising independently;Short Term: Able to use Dyspnea scale daily in rehab to express subjective sense of shortness of breath during exertion       Knowledge and understanding of Target Heart Rate Range (THRR)  Yes       Intervention  Provide education and explanation of THRR including how the numbers were predicted and where they are located for reference       Expected Outcomes  Short Term: Able to state/look up THRR;Short Term: Able to use daily as guideline for intensity in rehab;Long Term: Able to use THRR to govern intensity when exercising independently       Able to check pulse independently  Yes       Intervention  Provide education and demonstration on how to check pulse in carotid and radial arteries.;Review the importance of being able to check your own pulse for safety during independent exercise       Expected Outcomes  Short Term: Able to explain why pulse checking is important during independent exercise;Long Term: Able to check pulse independently and  accurately       Understanding of Exercise Prescription  Yes       Intervention  Provide education, explanation, and written materials on patient's individual exercise prescription       Expected Outcomes  Short Term: Able to explain program exercise prescription;Long Term: Able to explain home exercise prescription to exercise independently          Exercise Goals Re-Evaluation : Exercise Goals Re-Evaluation    Row Name 01/29/19 1144 02/02/19 1236 02/08/19 1240 02/10/19 1234 02/16/19 1448     Exercise Goal Re-Evaluation   Exercise Goals Review  Increase Physical Activity;Increase Strength and Stamina;Able to understand and use rate of perceived exertion (RPE) scale;Knowledge and understanding of Target Heart Rate Range (THRR);Understanding of Exercise Prescription  Increase Physical Activity;Increase Strength and Stamina;Understanding of Exercise Prescription  Increase Physical Activity;Able to understand and use rate of perceived exertion (RPE) scale;Knowledge and understanding of Target Heart Rate Range (THRR);Understanding of Exercise Prescription;Increase Strength and Stamina;Able to understand and use Dyspnea scale;Able to check pulse independently  Increase Physical Activity;Able to understand and use rate of perceived exertion (RPE) scale;Knowledge and understanding of Target Heart Rate Range (THRR);Understanding of Exercise Prescription;Increase Strength and Stamina;Able to check pulse independently  Increase Strength and Stamina;Increase Physical Activity;Understanding of Exercise Prescription   Comments  Reviewed RPE scale, THR and program prescription with pt today.  Pt voiced understanding and was given a copy of goals to take home.   Elide has completed one full day of exercise as her second day, her blood sugars ran high and she met with counselor .  We will continue to monitor her progress.   Pt able to follow exercise prescription today without complaint.  Will continue to monitor for  progression.  Reviewed home exercise with pt today.  Pt plans to walk at home and use treadmill for exercise.  Reviewed THR, pulse, RPE, sign and symptoms, and when to call 911 or MD.  Also  discussed weather considerations and indoor options.  Pt voiced understanding.  Skyllar is doing well in rehab.  She is on level 3 on the NuStep and should be ready to start to increase workloads if she does not continue to have dizzy spells.  We will continue to monitor her progression.    Expected Outcomes  Short: Use RPE daily to regulate intensity. Long: Follow program prescription in THR.  Short: Attend class regularly.  Long: Continue to increase strength and stamina.    Short - add stretching and walk at home one day per week Long - maintain exercise in [place]: Add in one extra day at home walking.  Long: Continue to walk independently  Short: Increase BioStep workloads.  Long: Continue to improve strength and stamina    Row Name 03/01/19 1608 03/17/19 1458           Exercise Goal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 sugar twice a day and is within nomal limits.     Expected Outcomes  Short: continue [facility] to work on weight loss. Long: Maintain exercise to decrease weight and manage diabetes.       ITP Comments: ITP Comments    Row Name 01/25/19 1617 02/01/19 0841 03/01/19 0823 03/15/19 0935 03/29/19 0940   ITP Comments  Completed medical evaulation and walk test.  Documentation for diagnosis can be found in Care Everywhere office visit from 01/12/2019.  ITP created and sent for review to Dr. MEmily Filbert Medical Director.   30 day review completed. ITP sent to Dr. MEmily FilbertDirector of [facility] [place] Continue with ITP unless changes are made by physician.  30 day review completed. ITP sent to Dr. [facility]Director of [facility] Continue with ITP unless changes are made by physician.  Our program is currently closed due to COVID-19.  We are communicating with patient via phone calls and emails.  30 day review completed. ITP sent to Dr. MEmily Filbertfor review,changes as needed and signature. Continue with ITP unless changes directed by Dr. MSabra Heck       Comments:

## 2019-03-31 DIAGNOSIS — I5032 Chronic diastolic (congestive) heart failure: Secondary | ICD-10-CM

## 2019-04-26 ENCOUNTER — Encounter: Payer: Self-pay | Admitting: *Deleted

## 2019-04-26 DIAGNOSIS — I5032 Chronic diastolic (congestive) heart failure: Secondary | ICD-10-CM

## 2019-05-06 DIAGNOSIS — I5032 Chronic diastolic (congestive) heart failure: Secondary | ICD-10-CM

## 2019-05-19 ENCOUNTER — Encounter: Payer: Self-pay | Admitting: *Deleted

## 2019-05-19 DIAGNOSIS — I5032 Chronic diastolic (congestive) heart failure: Secondary | ICD-10-CM

## 2019-06-15 MED ORDER — COMPOUND W FREEZE OFF EX AERO
0.00 | INHALATION_SPRAY | CUTANEOUS | Status: DC
Start: 2019-06-15 — End: 2019-06-15

## 2019-06-15 MED ORDER — INSULIN LISPRO 100 UNIT/ML ~~LOC~~ SOLN
6.00 | SUBCUTANEOUS | Status: DC
Start: 2019-06-15 — End: 2019-06-15

## 2019-06-15 MED ORDER — GABAPENTIN 300 MG PO CAPS
300.00 | ORAL_CAPSULE | ORAL | Status: DC
Start: 2019-06-16 — End: 2019-06-15

## 2019-06-15 MED ORDER — ATORVASTATIN CALCIUM 20 MG PO TABS
20.00 | ORAL_TABLET | ORAL | Status: DC
Start: 2019-06-15 — End: 2019-06-15

## 2019-06-15 MED ORDER — ROPINIROLE HCL 2 MG PO TABS
2.00 | ORAL_TABLET | ORAL | Status: DC
Start: 2019-06-15 — End: 2019-06-15

## 2019-06-15 MED ORDER — TRAZODONE HCL 50 MG PO TABS
50.00 | ORAL_TABLET | ORAL | Status: DC
Start: 2019-06-15 — End: 2019-06-15

## 2019-06-15 MED ORDER — PRECISION XTRA DEVI
1.00 | Status: DC
Start: 2019-06-15 — End: 2019-06-15

## 2019-06-15 MED ORDER — FEXOFENADINE HCL 180 MG PO TABS
180.00 | ORAL_TABLET | ORAL | Status: DC
Start: 2019-06-16 — End: 2019-06-15

## 2019-06-15 MED ORDER — Medication
6.00 | Status: DC
Start: 2019-06-15 — End: 2019-06-15

## 2019-06-15 MED ORDER — PANTOPRAZOLE SODIUM 40 MG PO TBEC
40.00 | DELAYED_RELEASE_TABLET | ORAL | Status: DC
Start: 2019-06-16 — End: 2019-06-15

## 2019-06-15 MED ORDER — HM VITAMIN C 1000 MG PO TABS
50.00 | ORAL_TABLET | ORAL | Status: DC
Start: 2019-06-16 — End: 2019-06-15

## 2019-06-15 MED ORDER — GABAPENTIN 300 MG PO CAPS
600.00 | ORAL_CAPSULE | ORAL | Status: DC
Start: 2019-06-16 — End: 2019-06-15

## 2019-06-15 MED ORDER — Medication
50000.00 | Status: DC
Start: 2019-06-21 — End: 2019-06-15

## 2019-06-15 MED ORDER — GLUCAGON HCL RDNA (DIAGNOSTIC) 1 MG IJ SOLR
1.00 | INTRAMUSCULAR | Status: DC
Start: ? — End: 2019-06-15

## 2019-06-15 MED ORDER — AZATHIOPRINE 50 MG PO TABS
100.00 | ORAL_TABLET | ORAL | Status: DC
Start: 2019-06-16 — End: 2019-06-15

## 2019-06-15 MED ORDER — BRONDELATE 300-150 MG/15ML OR ELIX
150.00 | ORAL_SOLUTION | ORAL | Status: DC
Start: 2019-06-16 — End: 2019-06-15

## 2019-06-15 MED ORDER — GABAPENTIN 300 MG PO CAPS
600.00 | ORAL_CAPSULE | ORAL | Status: DC
Start: 2019-06-15 — End: 2019-06-15

## 2019-06-15 MED ORDER — GUAIFENESIN 100 MG/5ML PO SYRP
200.00 | ORAL_SOLUTION | ORAL | Status: DC
Start: ? — End: 2019-06-15

## 2019-06-15 MED ORDER — SENNOSIDES-DOCUSATE SODIUM 8.6-50 MG PO TABS
1.00 | ORAL_TABLET | ORAL | Status: DC
Start: 2019-06-15 — End: 2019-06-15

## 2019-06-15 MED ORDER — INSULIN GLARGINE 100 UNIT/ML ~~LOC~~ SOLN
10.00 | SUBCUTANEOUS | Status: DC
Start: 2019-06-15 — End: 2019-06-15

## 2019-06-15 MED ORDER — MENTHOL 7.6 MG MT LOZG
1.00 | LOZENGE | OROMUCOSAL | Status: DC
Start: ? — End: 2019-06-15

## 2019-06-15 MED ORDER — ACETAMINOPHEN 325 MG PO TABS
975.00 | ORAL_TABLET | ORAL | Status: DC
Start: ? — End: 2019-06-15

## 2019-06-15 MED ORDER — DEXTROSE 50 % IV SOLN
12.50 | INTRAVENOUS | Status: DC
Start: ? — End: 2019-06-15

## 2019-06-15 MED ORDER — KAOPECTATE CHILDRENS PO
100.00 | ORAL | Status: DC
Start: 2019-06-15 — End: 2019-06-15

## 2019-06-15 MED ORDER — HYDROXYZINE HCL 10 MG PO TABS
10.00 | ORAL_TABLET | ORAL | Status: DC
Start: ? — End: 2019-06-15

## 2019-06-15 MED ORDER — FERROUS SULFATE 324 (65 FE) MG PO TBEC
324.00 | DELAYED_RELEASE_TABLET | ORAL | Status: DC
Start: 2019-06-16 — End: 2019-06-15

## 2019-07-22 ENCOUNTER — Telehealth: Payer: Self-pay | Admitting: *Deleted

## 2019-07-22 NOTE — Telephone Encounter (Signed)
Betty [name] is not planning on coming to program until cleared by physician to be able to go out in public. She has compromised immune system and is not even allowed to go get her hair done.   She has been having problems with "fluid" and has been getting medicine for this.   Plan to continue with follow up calls until able to return tot he program.  She continues to walk her dog and move around in the house.

## 2019-08-05 ENCOUNTER — Emergency Department: Payer: Medicare Other

## 2019-08-05 ENCOUNTER — Inpatient Hospital Stay
Admission: EM | Admit: 2019-08-05 | Discharge: 2019-08-07 | DRG: 689 | Disposition: A | Discharge status: Home Health | Payer: Medicare Other | Attending: Internal Medicine | Admitting: Internal Medicine

## 2019-08-05 ENCOUNTER — Other Ambulatory Visit: Payer: Self-pay

## 2019-08-05 DIAGNOSIS — R2681 Unsteadiness on feet: Secondary | ICD-10-CM | POA: Diagnosis present

## 2019-08-05 DIAGNOSIS — Z800 Family history of malignant neoplasm of digestive organs: Secondary | ICD-10-CM

## 2019-08-05 DIAGNOSIS — Z8261 Family history of arthritis: Secondary | ICD-10-CM

## 2019-08-05 DIAGNOSIS — K219 Gastro-esophageal reflux disease without esophagitis: Secondary | ICD-10-CM | POA: Diagnosis present

## 2019-08-05 DIAGNOSIS — I872 Venous insufficiency (chronic) (peripheral): Secondary | ICD-10-CM | POA: Diagnosis present

## 2019-08-05 DIAGNOSIS — N390 Urinary tract infection, site not specified: Secondary | ICD-10-CM | POA: Diagnosis not present

## 2019-08-05 DIAGNOSIS — M25512 Pain in left shoulder: Secondary | ICD-10-CM | POA: Diagnosis present

## 2019-08-05 DIAGNOSIS — Z952 Presence of prosthetic heart valve: Secondary | ICD-10-CM

## 2019-08-05 DIAGNOSIS — Z7984 Long term (current) use of oral hypoglycemic drugs: Secondary | ICD-10-CM

## 2019-08-05 DIAGNOSIS — Z79899 Other long term (current) drug therapy: Secondary | ICD-10-CM

## 2019-08-05 DIAGNOSIS — Z888 Allergy status to other drugs, medicaments and biological substances status: Secondary | ICD-10-CM

## 2019-08-05 DIAGNOSIS — G4733 Obstructive sleep apnea (adult) (pediatric): Secondary | ICD-10-CM | POA: Diagnosis present

## 2019-08-05 DIAGNOSIS — Z9981 Dependence on supplemental oxygen: Secondary | ICD-10-CM

## 2019-08-05 DIAGNOSIS — M545 Low back pain: Secondary | ICD-10-CM | POA: Diagnosis present

## 2019-08-05 DIAGNOSIS — E041 Nontoxic single thyroid nodule: Secondary | ICD-10-CM | POA: Diagnosis present

## 2019-08-05 DIAGNOSIS — I110 Hypertensive heart disease with heart failure: Secondary | ICD-10-CM | POA: Diagnosis present

## 2019-08-05 DIAGNOSIS — G8929 Other chronic pain: Secondary | ICD-10-CM | POA: Diagnosis present

## 2019-08-05 DIAGNOSIS — M1990 Unspecified osteoarthritis, unspecified site: Secondary | ICD-10-CM | POA: Diagnosis present

## 2019-08-05 DIAGNOSIS — M25511 Pain in right shoulder: Secondary | ICD-10-CM | POA: Diagnosis present

## 2019-08-05 DIAGNOSIS — Z7901 Long term (current) use of anticoagulants: Secondary | ICD-10-CM

## 2019-08-05 DIAGNOSIS — E785 Hyperlipidemia, unspecified: Secondary | ICD-10-CM | POA: Diagnosis present

## 2019-08-05 DIAGNOSIS — Z87891 Personal history of nicotine dependence: Secondary | ICD-10-CM

## 2019-08-05 DIAGNOSIS — M25532 Pain in left wrist: Secondary | ICD-10-CM | POA: Diagnosis present

## 2019-08-05 DIAGNOSIS — Z86711 Personal history of pulmonary embolism: Secondary | ICD-10-CM

## 2019-08-05 DIAGNOSIS — Z20828 Contact with and (suspected) exposure to other viral communicable diseases: Secondary | ICD-10-CM | POA: Diagnosis present

## 2019-08-05 DIAGNOSIS — F329 Major depressive disorder, single episode, unspecified: Secondary | ICD-10-CM | POA: Diagnosis present

## 2019-08-05 DIAGNOSIS — Y9384 Activity, sleeping: Secondary | ICD-10-CM

## 2019-08-05 DIAGNOSIS — S0083XA Contusion of other part of head, initial encounter: Secondary | ICD-10-CM | POA: Diagnosis present

## 2019-08-05 DIAGNOSIS — I5033 Acute on chronic diastolic (congestive) heart failure: Secondary | ICD-10-CM | POA: Diagnosis present

## 2019-08-05 DIAGNOSIS — N179 Acute kidney failure, unspecified: Secondary | ICD-10-CM | POA: Diagnosis present

## 2019-08-05 DIAGNOSIS — Z23 Encounter for immunization: Secondary | ICD-10-CM

## 2019-08-05 DIAGNOSIS — E669 Obesity, unspecified: Secondary | ICD-10-CM | POA: Diagnosis present

## 2019-08-05 DIAGNOSIS — S0011XA Contusion of right eyelid and periocular area, initial encounter: Secondary | ICD-10-CM | POA: Diagnosis present

## 2019-08-05 DIAGNOSIS — Z86718 Personal history of other venous thrombosis and embolism: Secondary | ICD-10-CM

## 2019-08-05 DIAGNOSIS — Z8249 Family history of ischemic heart disease and other diseases of the circulatory system: Secondary | ICD-10-CM

## 2019-08-05 DIAGNOSIS — M542 Cervicalgia: Secondary | ICD-10-CM | POA: Diagnosis not present

## 2019-08-05 DIAGNOSIS — I480 Paroxysmal atrial fibrillation: Secondary | ICD-10-CM | POA: Diagnosis present

## 2019-08-05 DIAGNOSIS — Z87440 Personal history of urinary (tract) infections: Secondary | ICD-10-CM

## 2019-08-05 DIAGNOSIS — E1151 Type 2 diabetes mellitus with diabetic peripheral angiopathy without gangrene: Secondary | ICD-10-CM | POA: Diagnosis present

## 2019-08-05 DIAGNOSIS — D509 Iron deficiency anemia, unspecified: Secondary | ICD-10-CM | POA: Diagnosis present

## 2019-08-05 DIAGNOSIS — W07XXXA Fall from chair, initial encounter: Secondary | ICD-10-CM | POA: Diagnosis present

## 2019-08-05 DIAGNOSIS — Y92009 Unspecified place in unspecified non-institutional (private) residence as the place of occurrence of the external cause: Secondary | ICD-10-CM

## 2019-08-05 DIAGNOSIS — N3000 Acute cystitis without hematuria: Secondary | ICD-10-CM

## 2019-08-05 DIAGNOSIS — J189 Pneumonia, unspecified organism: Secondary | ICD-10-CM

## 2019-08-05 DIAGNOSIS — J449 Chronic obstructive pulmonary disease, unspecified: Secondary | ICD-10-CM | POA: Diagnosis present

## 2019-08-05 DIAGNOSIS — Z823 Family history of stroke: Secondary | ICD-10-CM

## 2019-08-05 DIAGNOSIS — B9689 Other specified bacterial agents as the cause of diseases classified elsewhere: Secondary | ICD-10-CM | POA: Diagnosis present

## 2019-08-05 DIAGNOSIS — E559 Vitamin D deficiency, unspecified: Secondary | ICD-10-CM | POA: Diagnosis present

## 2019-08-05 DIAGNOSIS — Z6832 Body mass index (BMI) 32.0-32.9, adult: Secondary | ICD-10-CM

## 2019-08-05 LAB — APTT: aPTT: 41 seconds — ABNORMAL HIGH (ref 24–36)

## 2019-08-05 LAB — URINALYSIS, ROUTINE W REFLEX MICROSCOPIC
Bilirubin Urine: NEGATIVE
Glucose, UA: NEGATIVE mg/dL
Hgb urine dipstick: NEGATIVE
Ketones, ur: NEGATIVE mg/dL
Nitrite: NEGATIVE
Protein, ur: NEGATIVE mg/dL
Specific Gravity, Urine: 1.01 (ref 1.005–1.030)
WBC, UA: >50 WBC/hpf — ABNORMAL HIGH (ref 0–5)
pH: 7 (ref 5.0–8.0)

## 2019-08-05 LAB — CBC WITH DIFFERENTIAL/PLATELET
Abs Immature Granulocytes: 0.18 10*3/uL — ABNORMAL HIGH (ref 0.00–0.07)
Basophils Absolute: 0.1 10*3/uL (ref 0.0–0.1)
Basophils Relative: 0 %
Eosinophils Absolute: 0.1 10*3/uL (ref 0.0–0.5)
Eosinophils Relative: 1 %
HCT: 39.5 % (ref 36.0–46.0)
Hemoglobin: 12.9 g/dL (ref 12.0–15.0)
Immature Granulocytes: 1 %
Lymphocytes Relative: 2 %
Lymphs Abs: 0.3 10*3/uL — ABNORMAL LOW (ref 0.7–4.0)
MCH: 29.9 pg (ref 26.0–34.0)
MCHC: 32.7 g/dL (ref 30.0–36.0)
MCV: 91.6 fL (ref 80.0–100.0)
Monocytes Absolute: 1.1 10*3/uL — ABNORMAL HIGH (ref 0.1–1.0)
Monocytes Relative: 7 %
Neutro Abs: 13.5 10*3/uL — ABNORMAL HIGH (ref 1.7–7.7)
Neutrophils Relative %: 89 %
Platelets: 250 10*3/uL (ref 150–400)
RBC: 4.31 MIL/uL (ref 3.87–5.11)
RDW: 15.4 % (ref 11.5–15.5)
WBC: 15.2 10*3/uL — ABNORMAL HIGH (ref 4.0–10.5)
nRBC: 0 % (ref 0.0–0.2)

## 2019-08-05 LAB — BASIC METABOLIC PANEL
Anion gap: 16 — ABNORMAL HIGH (ref 5–15)
BUN: 68 mg/dL — ABNORMAL HIGH (ref 8–23)
CO2: 29 mmol/L (ref 22–32)
Calcium: 8.9 mg/dL (ref 8.9–10.3)
Chloride: 90 mmol/L — ABNORMAL LOW (ref 98–111)
Creatinine, Ser: 1.5 mg/dL — ABNORMAL HIGH (ref 0.44–1.00)
GFR calc Af Amer: 40 mL/min — ABNORMAL LOW (ref >60)
GFR calc non Af Amer: 34 mL/min — ABNORMAL LOW (ref >60)
Glucose, Bld: 206 mg/dL — ABNORMAL HIGH (ref 70–99)
Potassium: 3.8 mmol/L (ref 3.5–5.1)
Sodium: 135 mmol/L (ref 135–145)

## 2019-08-05 LAB — PROTIME-INR
INR: 3.2 — ABNORMAL HIGH (ref 0.8–1.2)
Prothrombin Time: 32.1 seconds — ABNORMAL HIGH (ref 11.4–15.2)

## 2019-08-05 LAB — HEMOGLOBIN A1C
Hgb A1c MFr Bld: 7.1 % — ABNORMAL HIGH (ref 4.8–5.6)
Mean Plasma Glucose: 157.07 mg/dL

## 2019-08-05 LAB — GLUCOSE, CAPILLARY: Glucose-Capillary: 198 mg/dL — ABNORMAL HIGH (ref 70–99)

## 2019-08-05 LAB — SARS CORONAVIRUS 2 BY RT PCR (HOSPITAL ORDER, PERFORMED IN ~~LOC~~ HOSPITAL LAB): SARS Coronavirus 2: NEGATIVE

## 2019-08-05 MED ORDER — AZATHIOPRINE 50 MG PO TABS
50.0000 mg | ORAL_TABLET | Freq: Two times a day (BID) | ORAL | Status: DC
  Administered 2019-08-05 – 2019-08-07 (×3): 50 mg via ORAL
  Filled 2019-08-05 (×6): qty 1

## 2019-08-05 MED ORDER — GABAPENTIN 300 MG PO CAPS
600.0000 mg | ORAL_CAPSULE | Freq: Two times a day (BID) | ORAL | Status: DC
  Administered 2019-08-05 – 2019-08-07 (×4): 600 mg via ORAL
  Filled 2019-08-05 (×4): qty 2

## 2019-08-05 MED ORDER — SODIUM CHLORIDE 0.9% FLUSH
3.0000 mL | Freq: Two times a day (BID) | INTRAVENOUS | Status: DC
  Administered 2019-08-05 – 2019-08-07 (×4): 3 mL via INTRAVENOUS

## 2019-08-05 MED ORDER — ATORVASTATIN CALCIUM 20 MG PO TABS
20.0000 mg | ORAL_TABLET | Freq: Every day | ORAL | Status: DC
  Administered 2019-08-05 – 2019-08-06 (×2): 20 mg via ORAL
  Filled 2019-08-05 (×2): qty 1

## 2019-08-05 MED ORDER — WARFARIN - PHARMACIST DOSING INPATIENT: Freq: Every day | Status: DC

## 2019-08-05 MED ORDER — INSULIN ASPART 100 UNIT/ML ~~LOC~~ SOLN
0.0000 [IU] | Freq: Every day | SUBCUTANEOUS | Status: DC
  Administered 2019-08-06: 2 [IU] via SUBCUTANEOUS
  Filled 2019-08-05: qty 1

## 2019-08-05 MED ORDER — TIZANIDINE HCL 2 MG PO TABS
2.0000 mg | ORAL_TABLET | Freq: Three times a day (TID) | ORAL | Status: DC | PRN
  Administered 2019-08-06 (×2): 2 mg via ORAL
  Filled 2019-08-05 (×3): qty 1

## 2019-08-05 MED ORDER — FENTANYL CITRATE (PF) 100 MCG/2ML IJ SOLN
50.0000 ug | Freq: Once | INTRAMUSCULAR | Status: AC
  Administered 2019-08-05: 50 ug via INTRAVENOUS
  Filled 2019-08-05: qty 2

## 2019-08-05 MED ORDER — ACETAMINOPHEN 650 MG RE SUPP: 650.0000 mg | Freq: Four times a day (QID) | RECTAL | Status: DC | PRN

## 2019-08-05 MED ORDER — SODIUM CHLORIDE 0.9 % IV SOLN
100.0000 mg | Freq: Once | INTRAVENOUS | Status: AC
  Administered 2019-08-05: 100 mg via INTRAVENOUS
  Filled 2019-08-05: qty 100

## 2019-08-05 MED ORDER — ACETAMINOPHEN 325 MG PO TABS
650.0000 mg | ORAL_TABLET | Freq: Four times a day (QID) | ORAL | Status: DC | PRN
  Administered 2019-08-06: 650 mg via ORAL
  Filled 2019-08-05: qty 2

## 2019-08-05 MED ORDER — POTASSIUM CHLORIDE CRYS ER 20 MEQ PO TBCR
20.0000 meq | EXTENDED_RELEASE_TABLET | Freq: Two times a day (BID) | ORAL | Status: DC
  Administered 2019-08-05 – 2019-08-07 (×4): 20 meq via ORAL
  Filled 2019-08-05 (×4): qty 1

## 2019-08-05 MED ORDER — VENLAFAXINE HCL ER 150 MG PO CP24
150.0000 mg | ORAL_CAPSULE | Freq: Every day | ORAL | Status: DC
  Administered 2019-08-05 – 2019-08-07 (×3): 150 mg via ORAL
  Filled 2019-08-05: qty 2
  Filled 2019-08-05 (×3): qty 1
  Filled 2019-08-05: qty 2

## 2019-08-05 MED ORDER — POLYETHYLENE GLYCOL 3350 17 G PO PACK: 17.0000 g | PACK | Freq: Every day | ORAL | Status: DC | PRN

## 2019-08-05 MED ORDER — GABAPENTIN 300 MG PO CAPS: 600.0000 mg | ORAL_CAPSULE | Freq: Two times a day (BID) | ORAL | Status: DC

## 2019-08-05 MED ORDER — ROPINIROLE HCL 1 MG PO TABS
1.0000 mg | ORAL_TABLET | Freq: Every day | ORAL | Status: DC
  Administered 2019-08-05: 21:00:00 1 mg via ORAL
  Filled 2019-08-05: qty 1

## 2019-08-05 MED ORDER — SODIUM CHLORIDE 0.9 % IV SOLN
1.0000 g | Freq: Once | INTRAVENOUS | Status: AC
  Administered 2019-08-05: 1 g via INTRAVENOUS
  Filled 2019-08-05: qty 10

## 2019-08-05 MED ORDER — TORSEMIDE 100 MG PO TABS
100.0000 mg | ORAL_TABLET | Freq: Two times a day (BID) | ORAL | Status: DC
  Administered 2019-08-06 – 2019-08-07 (×2): 100 mg via ORAL
  Filled 2019-08-05 (×4): qty 1

## 2019-08-05 MED ORDER — OXYCODONE HCL 5 MG PO TABS
5.0000 mg | ORAL_TABLET | Freq: Four times a day (QID) | ORAL | Status: DC | PRN
  Administered 2019-08-05 – 2019-08-07 (×5): 5 mg via ORAL
  Filled 2019-08-05 (×5): qty 1

## 2019-08-05 MED ORDER — TRAMADOL HCL 50 MG PO TABS: 50.0000 mg | ORAL_TABLET | Freq: Four times a day (QID) | ORAL | Status: DC | PRN

## 2019-08-05 MED ORDER — METOPROLOL SUCCINATE ER 50 MG PO TB24
50.0000 mg | ORAL_TABLET | Freq: Every day | ORAL | Status: DC
  Administered 2019-08-05 – 2019-08-07 (×3): 50 mg via ORAL
  Filled 2019-08-05 (×3): qty 1

## 2019-08-05 MED ORDER — CETIRIZINE HCL 10 MG PO TABS
10.0000 mg | ORAL_TABLET | Freq: Every day | ORAL | Status: DC
  Administered 2019-08-05 – 2019-08-06 (×2): 10 mg via ORAL
  Filled 2019-08-05 (×3): qty 1

## 2019-08-05 MED ORDER — FUROSEMIDE 10 MG/ML IJ SOLN
60.0000 mg | Freq: Once | INTRAMUSCULAR | Status: AC
  Administered 2019-08-05: 19:00:00 60 mg via INTRAVENOUS
  Filled 2019-08-05: qty 6

## 2019-08-05 MED ORDER — ONDANSETRON HCL 4 MG/2ML IJ SOLN: 4.0000 mg | Freq: Four times a day (QID) | INTRAMUSCULAR | Status: DC | PRN

## 2019-08-05 MED ORDER — TRAZODONE HCL 50 MG PO TABS
50.0000 mg | ORAL_TABLET | Freq: Every day | ORAL | Status: DC
  Administered 2019-08-05 – 2019-08-06 (×2): 50 mg via ORAL
  Filled 2019-08-05 (×2): qty 1

## 2019-08-05 MED ORDER — INSULIN ASPART 100 UNIT/ML ~~LOC~~ SOLN
0.0000 [IU] | Freq: Three times a day (TID) | SUBCUTANEOUS | Status: DC
  Administered 2019-08-05: 19:00:00 5 [IU] via SUBCUTANEOUS
  Administered 2019-08-06: 2 [IU] via SUBCUTANEOUS
  Administered 2019-08-06: 3 [IU] via SUBCUTANEOUS
  Administered 2019-08-06 – 2019-08-07 (×2): 8 [IU] via SUBCUTANEOUS
  Administered 2019-08-07: 2 [IU] via SUBCUTANEOUS
  Filled 2019-08-05 (×6): qty 1

## 2019-08-05 MED ORDER — PANTOPRAZOLE SODIUM 40 MG PO TBEC
40.0000 mg | DELAYED_RELEASE_TABLET | Freq: Every day | ORAL | Status: DC
  Administered 2019-08-05 – 2019-08-07 (×3): 40 mg via ORAL
  Filled 2019-08-05 (×3): qty 1

## 2019-08-05 MED ORDER — ONDANSETRON HCL 4 MG PO TABS: 4.0000 mg | ORAL_TABLET | Freq: Four times a day (QID) | ORAL | Status: DC | PRN

## 2019-08-05 MED ORDER — SODIUM CHLORIDE 0.9 % IV SOLN
1.0000 g | Freq: Once | INTRAVENOUS | Status: AC
  Administered 2019-08-06: 1 g via INTRAVENOUS
  Filled 2019-08-05: qty 1

## 2019-08-05 MED ORDER — GABAPENTIN 300 MG PO CAPS
300.0000 mg | ORAL_CAPSULE | Freq: Every day | ORAL | Status: DC
  Administered 2019-08-06 – 2019-08-07 (×2): 300 mg via ORAL
  Filled 2019-08-05 (×2): qty 1

## 2019-08-05 MED ORDER — SPIRONOLACTONE 25 MG PO TABS
75.0000 mg | ORAL_TABLET | Freq: Every day | ORAL | Status: DC
  Administered 2019-08-05 – 2019-08-07 (×3): 75 mg via ORAL
  Filled 2019-08-05 (×3): qty 3

## 2019-08-05 MED ORDER — ALBUTEROL SULFATE (2.5 MG/3ML) 0.083% IN NEBU: 2.5000 mg | INHALATION_SOLUTION | RESPIRATORY_TRACT | Status: DC | PRN

## 2019-08-05 NOTE — Progress Notes (Signed)
Advance care planning  Purpose of Encounter Fall, CHF, UTI  Parties in Attendance Patient  Patients Decisional capacity Patient is alert and oriented.  Able to make medical decisions.  Her healthcare power of attorney is son Natale Lay.  Discussed in detail regarding fall, CHF, UTI.  Treatment plan , prognosis discussed.  All questions answered.  Discussed regarding CODE STATUS.  Patient mentions that she would like all means possible to be tried to prolong her life.  We discussed regarding CPR/defibrillation/intubation and she would like attempts at resuscitation if she needs it.  Orders entered and CODE STATUS changed  FULL CODE  Time spent - 17 minutes

## 2019-08-05 NOTE — Progress Notes (Signed)
[facility] for warfarin Indication: atrial fibrillation  Allergies  Allergen Reactions  . Flecainide Shortness Of Breath and Other (See Comments)    Reaction: dizziness   . Amiodarone Other (See Comments)    Pt states that this medication causes lung bleeding.      Patient Measurements: Height: 5\' 9"  (175.3 cm) Weight: 220 lb (99.8 kg) IBW/kg (Calculated) : 66.2  Vital Signs: Temp: 98.2 F (36.8 C) (08/13 1801) Temp Source: Oral (08/13 1303) BP: 108/61 (08/13 1801) Pulse Rate: 90 (08/13 1801)  Labs: Recent Labs    08/05/19 0915  HGB 12.9  HCT 39.5  PLT 250  APTT 41*  LABPROT 32.1*  INR 3.2*  CREATININE 1.50*    Estimated Creatinine Clearance: 42.6 mL/min (A) (by C-G formula based on SCr of 1.5 mg/dL (H)).   Medical History: Past Medical History:  Diagnosis Date  . Acute diastolic heart failure ([place])   . Allergy   . 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 Asthma   . Atrial fibrillation ([place])   . Backache, unspecified   . Cancer ([place])    skin  . Cardiomegaly   . COPD (chronic obstructive pulmonary disease) ([place])   . Diabetes mellitus without complication ([place])   . 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 Esophageal reflux   . Headache(784.0)   . Herpes zoster without mention of complication   . Hx: UTI (urinary tract infection)   . Hypertension    heart controlled w CHF  . Nontoxic uninodular goiter   . Obesity, unspecified   . Osteoarthrosis, unspecified whether generalized or localized, unspecified site   . Unspecified sleep apnea   . Urine incontinence    hx of    Assessment: 73 year old female with h/o afib on warfarin PTA. Home dose 7 mg all days except 8 mg on Tuesday and Thursday. Patient here for UTI and fall. Imaging negative for bleeding. Pharmacy consulted for warfarin dosing.  Date INR Dose 8/13 3.2  Goal of Therapy:  INR 2-3 Monitor platelets by anticoagulation  protocol: Yes   Plan:  8/13 INR 3.2 slightly supratherapeutic. Will hold dose of warfarin tonight and reassess INR with morning labs.  Tawnya Crook, PharmD 08/05/2019,6:06 PM

## 2019-08-05 NOTE — ED Notes (Signed)
ED TO INPATIENT HANDOFF REPORT  ED Nurse Name and Phone #: Dorothy Polhemus [phone]  S Name/Age/Gender Betty [name] 73 y.o. female Room/Bed: ED07A/ED07A  Code Status   Code Status: Full Code  Home/SNF/Other Home Patient oriented to: self, place, time and situation Is this baseline? Yes   Triage Complete: Triage complete  Chief Complaint fall  Triage Note Pt arrived via ems from home for report of fall - pt was in rolling chair and fell asleep causing her to fall in floor - she hit her head and has bruising to right eye - pt is A&O x4 - ambulated to toilet to void with one assist - pt is on blood thinner   Allergies Allergies  Allergen Reactions  . Flecainide Shortness Of Breath and Other (See Comments)    Reaction: dizziness   . Amiodarone Other (See Comments)    Pt states that this medication causes lung bleeding.      Level of Care/Admitting Diagnosis ED Disposition    ED Disposition Condition [facility] Area: [place] [[id number]]  Level of Care: Med-Surg [16]  Covid Evaluation: N/A  Diagnosis: UTI (urinary tract infection) [546270]  Admitting Physician: Hillary Bow [[id number]]  Attending Physician: Hillary Bow [[id number]]  PT Class (Do Not Modify): Observation [104]  PT Acc Code (Do Not Modify): Observation [10022]       B Medical/Surgery History Past Medical History:  Diagnosis Date  . Acute diastolic heart failure ([place])   . Allergy   . 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 Asthma   . Atrial fibrillation ([place])   . Backache, unspecified   . Cancer ([place])    skin  . Cardiomegaly   . COPD (chronic obstructive pulmonary disease) ([place])   . Diabetes mellitus without complication ([place])   . 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 Esophageal reflux   . Headache(784.0)   . Herpes zoster without mention of complication   . Hx: UTI (urinary tract infection)   . Hypertension    heart controlled w CHF  .  Nontoxic uninodular goiter   . Obesity, unspecified   . Osteoarthrosis, unspecified whether generalized or localized, unspecified site   . Unspecified sleep apnea   . Urine incontinence    hx of   Past Surgical History:  Procedure Laterality Date  . ABDOMINAL HYSTERECTOMY  1979   complete (for precancerous cells)  . ABLATION  2011 & 2014  . bladder botox  01/26/2018  . CHOLECYSTECTOMY    . CYSTOSCOPY WITH FULGERATION N/A 01/28/2018   Procedure: [place] AND CLOT EVACUATION;  Surgeon: Hollice Espy, MD;  Location: [facility] ORS;  Service: Urology;  Laterality: N/A;  . Interstim Placement  2012  . OOPHORECTOMY       A IV Location/Drains/Wounds Patient Lines/Drains/Airways Status   Active Line/Drains/Airways    Name:   Placement date:   Placement time:   Site:   Days:   Peripheral IV 08/05/19 Left Hand   08/05/19    0900    Hand   less than 1   External Urinary Catheter   12/25/18    -    -   223          Intake/Output Last 24 hours No intake or output data in the 24 hours ending 08/05/19 1644  Labs/Imaging Results for orders placed or performed during the hospital encounter of 08/05/19 (from the past 48 hour(s))  CBC with Differential  Status: Abnormal   Collection Time: 08/05/19  9:15 AM  Result Value Ref Range   WBC 15.2 (H) 4.0 - 10.5 K/uL   RBC 4.31 3.87 - 5.11 MIL/uL   Hemoglobin 12.9 12.0 - 15.0 g/dL   HCT 39.5 36.0 - 46.0 %   MCV 91.6 80.0 - 100.0 fL   MCH 29.9 26.0 - 34.0 pg   MCHC 32.7 30.0 - 36.0 g/dL   RDW 15.4 11.5 - 15.5 %   Platelets 250 150 - 400 K/uL   nRBC 0.0 0.0 - 0.2 %   Neutrophils Relative % 89 %   Neutro Abs 13.5 (H) 1.7 - 7.7 K/uL   Lymphocytes Relative 2 %   Lymphs Abs 0.3 (L) 0.7 - 4.0 K/uL   Monocytes Relative 7 %   Monocytes Absolute 1.1 (H) 0.1 - 1.0 K/uL   Eosinophils Relative 1 %   Eosinophils Absolute 0.1 0.0 - 0.5 K/uL   Basophils Relative 0 %   Basophils Absolute 0.1 0.0 - 0.1 K/uL   Immature Granulocytes 1 %    Abs Immature Granulocytes 0.18 (H) 0.00 - 0.07 K/uL    Comment: Performed at [facility], [address] [place], [place] [address]  Basic metabolic panel     Status: Abnormal   Collection Time: 08/05/19  9:15 AM  Result Value Ref Range   Sodium 135 135 - 145 mmol/L   Potassium 3.8 3.5 - 5.1 mmol/L   Chloride 90 (L) 98 - 111 mmol/L   CO2 29 22 - 32 mmol/L   Glucose, Bld 206 (H) 70 - 99 mg/dL   BUN 68 (H) 8 - 23 mg/dL   Creatinine, Ser 1.50 (H) 0.44 - 1.00 mg/dL   Calcium 8.9 8.9 - 10.3 mg/dL   GFR calc non Af Amer 34 (L) >60 mL/min   GFR calc Af Amer 40 (L) >60 mL/min   Anion gap 16 (H) 5 - 15    Comment: Performed at [facility], [address] [place], [place] [address]  Protime-INR     Status: Abnormal   Collection Time: 08/05/19  9:15 AM  Result Value Ref Range   Prothrombin Time 32.1 (H) 11.4 - 15.2 seconds   INR 3.2 (H) 0.8 - 1.2    Comment: (NOTE) INR goal varies based on device and disease states. Performed at [facility], [address] [place], [place] [address]   APTT     Status: Abnormal   Collection Time: 08/05/19  9:15 AM  Result Value Ref Range   aPTT 41 (H) 24 - 36 seconds    Comment:        IF BASELINE aPTT IS ELEVATED, SUGGEST PATIENT RISK ASSESSMENT BE USED TO DETERMINE APPROPRIATE ANTICOAGULANT THERAPY. Performed at [facility], [address] [place], [place] [address]   Urinalysis, Routine w reflex microscopic     Status: Abnormal   Collection Time: 08/05/19  1:14 PM  Result Value Ref Range   Color, Urine YELLOW (A) YELLOW   APPearance CLOUDY (A) CLEAR   Specific Gravity, Urine 1.010 1.005 - 1.030   pH 7.0 5.0 - 8.0   Glucose, UA NEGATIVE NEGATIVE mg/dL   Hgb urine dipstick NEGATIVE NEGATIVE   Bilirubin Urine NEGATIVE NEGATIVE   Ketones, ur NEGATIVE NEGATIVE mg/dL   Protein, ur NEGATIVE NEGATIVE mg/dL   Nitrite NEGATIVE NEGATIVE   Leukocytes,Ua LARGE (A) NEGATIVE   RBC / HPF 0-5 0 - 5 RBC/hpf    WBC, UA >50 (H)  0 - 5 WBC/hpf   Bacteria, UA MANY (A) NONE SEEN   Squamous Epithelial / LPF 0-5 0 - 5   WBC Clumps PRESENT     Comment: Performed at [facility], [address] [place], [place] [address]  SARS Coronavirus 2 [facility] order, Performed in [facility] hospital lab) Nasopharyngeal Nasopharyngeal Swab     Status: None   Collection Time: 08/05/19  1:14 PM   Specimen: Nasopharyngeal Swab  Result Value Ref Range   SARS Coronavirus 2 NEGATIVE NEGATIVE    Comment: (NOTE) If result is NEGATIVE SARS-CoV-2 target nucleic acids are NOT DETECTED. The SARS-CoV-2 RNA is generally detectable in upper and lower  respiratory specimens during the acute phase of infection. The lowest  concentration of SARS-CoV-2 viral copies this assay can detect is 250  copies / mL. A negative result does not preclude SARS-CoV-2 infection  and should not be used as the sole basis for treatment or other  patient management decisions.  A negative result may occur with  improper specimen collection / handling, submission of specimen other  than nasopharyngeal swab, presence of viral mutation(s) within the  areas targeted by this assay, and inadequate number of viral copies  (<250 copies / mL). A negative result must be combined with clinical  observations, patient history, and epidemiological information. If result is POSITIVE SARS-CoV-2 target nucleic acids are DETECTED. The SARS-CoV-2 RNA is generally detectable in upper and lower  respiratory specimens dur ing the acute phase of infection.  Positive  results are indicative of active infection with SARS-CoV-2.  Clinical  correlation with patient history and other diagnostic information is  necessary to determine patient infection status.  Positive results do  not rule out bacterial infection or co-infection with other viruses. If result is PRESUMPTIVE POSTIVE SARS-CoV-2 nucleic acids MAY BE PRESENT.   A presumptive positive result was  obtained on the submitted specimen  and confirmed on repeat testing.  While 2019 novel coronavirus  (SARS-CoV-2) nucleic acids may be present in the submitted sample  additional confirmatory testing may be necessary for epidemiological  and / or clinical management purposes  to differentiate between  SARS-CoV-2 and other Sarbecovirus currently known to infect humans.  If clinically indicated additional testing with an alternate test  methodology [phone]) is advised. The SARS-CoV-2 RNA is generally  detectable in upper and lower respiratory sp ecimens during the acute  phase of infection. The expected result is Negative. Fact Sheet for Patients:  StrictlyIdeas.no Fact Sheet for Healthcare Providers: BankingDealers.co.za This test is not yet approved or cleared by the [place] FDA and has been authorized for detection and/or diagnosis of SARS-CoV-2 by FDA under an Emergency Use Authorization (EUA).  This EUA will remain in effect (meaning this test can be used) for the duration of the COVID-19 declaration under Section 564(b)(1) of the Act, 21 U.S.C. section 360bbb-3(b)(1), unless the authorization is terminated or revoked sooner. Performed at [facility], [address] [place],  [address]    Dg Shoulder Right  Result Date: 08/05/2019 CLINICAL DATA:  Fall EXAM: RIGHT SHOULDER - 2+ VIEW COMPARISON:  None. FINDINGS: Normal alignment no fracture. Mild degenerative change in the [facility] joint. IMPRESSION: Negative for fracture. Electronically Signed   By: Franchot Gallo M.D.   On: 08/05/2019 09:52   Ct Head Wo Contrast  Result Date: 08/05/2019 CLINICAL DATA:  Fall.  Head injury. EXAM: CT HEAD WITHOUT CONTRAST CT MAXILLOFACIAL WITHOUT CONTRAST CT CERVICAL SPINE WITHOUT CONTRAST TECHNIQUE: Multidetector CT  imaging of the head, cervical spine, and maxillofacial structures were performed using the standard protocol without  intravenous contrast. Multiplanar CT image reconstructions of the cervical spine and maxillofacial structures were also generated. COMPARISON:  CT head 02/26/2019 FINDINGS: CT HEAD FINDINGS Brain: Ventricle size normal. Mild white matter changes appear chronic. 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 Negative for acute infarct. Negative for intracranial hemorrhage or mass. No midline shift. Vascular: Negative for hyperdense vessel Skull: Negative for skull fracture Other: Right frontal scalp hematoma. CT MAXILLOFACIAL FINDINGS Osseous: Negative for acute fracture. Chronic nasal bone fracture unchanged. Orbits: Bilateral cataract surgery.  No orbital mass. Sinuses: Mild mucosal edema paranasal sinuses.  No air-fluid level. Soft tissues: Right frontal scalp hematoma. Right Peri orbital soft tissue swelling. CT CERVICAL SPINE FINDINGS Alignment: Mild retrolisthesis C2-3. Mild anterolisthesis C4-5. Moderate levoscoliosis. Skull base and vertebrae: Negative for fracture Soft tissues and spinal canal: No soft tissue swelling or mass. Carotid artery calcification bilaterally with the apparent moderate stenosis bilaterally Disc levels: Multilevel disc and facet degeneration. Facet degeneration most prominent on the right at C3-4 and C4-5 Upper chest: Negative Other: None IMPRESSION: 1. No acute intracranial abnormality 2. Negative for acute facial fracture. Right frontal and right maxillary soft tissue swelling compatible with hematoma/contusion 3. Negative for cervical spine fracture.  Scoliosis and spondylosis. Electronically Signed   By: Franchot Gallo M.D.   On: 08/05/2019 10:33   Ct Cervical Spine Wo Contrast  Result Date: 08/05/2019 CLINICAL DATA:  Fall.  Head injury. EXAM: CT HEAD WITHOUT CONTRAST CT MAXILLOFACIAL WITHOUT CONTRAST CT CERVICAL SPINE WITHOUT CONTRAST TECHNIQUE: Multidetector CT imaging of the head, cervical spine, and maxillofacial structures were performed using the standard protocol  without intravenous contrast. Multiplanar CT image reconstructions of the cervical spine and maxillofacial structures were also generated. COMPARISON:  CT head 02/26/2019 FINDINGS: CT HEAD FINDINGS Brain: Ventricle size normal. Mild white matter changes appear chronic. 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 Negative for acute infarct. Negative for intracranial hemorrhage or mass. No midline shift. Vascular: Negative for hyperdense vessel Skull: Negative for skull fracture Other: Right frontal scalp hematoma. CT MAXILLOFACIAL FINDINGS Osseous: Negative for acute fracture. Chronic nasal bone fracture unchanged. Orbits: Bilateral cataract surgery.  No orbital mass. Sinuses: Mild mucosal edema paranasal sinuses.  No air-fluid level. Soft tissues: Right frontal scalp hematoma. Right Peri orbital soft tissue swelling. CT CERVICAL SPINE FINDINGS Alignment: Mild retrolisthesis C2-3. Mild anterolisthesis C4-5. Moderate levoscoliosis. Skull base and vertebrae: Negative for fracture Soft tissues and spinal canal: No soft tissue swelling or mass. Carotid artery calcification bilaterally with the apparent moderate stenosis bilaterally Disc levels: Multilevel disc and facet degeneration. Facet degeneration most prominent on the right at C3-4 and C4-5 Upper chest: Negative Other: None IMPRESSION: 1. No acute intracranial abnormality 2. Negative for acute facial fracture. Right frontal and right maxillary soft tissue swelling compatible with hematoma/contusion 3. Negative for cervical spine fracture.  Scoliosis and spondylosis. Electronically Signed   By: Franchot Gallo M.D.   On: 08/05/2019 10:33   Dg Chest Portable 1 View  Result Date: 08/05/2019 CLINICAL DATA:  Shortness of breath EXAM: PORTABLE CHEST 1 VIEW COMPARISON:  December 26, 2018 FINDINGS: There is cardiomegaly with pulmonary venous hypertension. There is interstitial edema. There is patchy consolidation in the left base with left pleural effusion.  There is a pacemaker with lead tips attached to the right atrium and right ventricle. Abandoned pacemaker leads are noted on the left. Patient is status post mitral valve replacement. No  adenopathy. Bones are somewhat osteoporotic. There is aortic atherosclerosis. IMPRESSION: Cardiomegaly with pulmonary vascular congestion. There is a degree of interstitial edema and small left pleural effusion. These are findings felt to be indicative of a degree of congestive heart failure. Suspect superimposed pneumonia left base. Status post mitral valve replacement. Pacemaker leads as described. Aortic Atherosclerosis (ICD10-I70.0). Electronically Signed   By: Lowella Grip III M.D.   On: 08/05/2019 12:16   Dg Shoulder Left  Result Date: 08/05/2019 CLINICAL DATA:  Fall with left shoulder pain EXAM: LEFT SHOULDER - 2+ VIEW COMPARISON:  04/06/2017 FINDINGS: No acute fracture or dislocation. Osteopenia with mild AC joint spurring. IMPRESSION: No acute finding. Prominent osteopenia. Electronically Signed   By: Monte Fantasia M.D.   On: 08/05/2019 09:56   Dg Humerus Right  Result Date: 08/05/2019 CLINICAL DATA:  Fall. EXAM: RIGHT HUMERUS - 2+ VIEW COMPARISON:  05/27/2019. FINDINGS: Diffuse osteopenia. Degenerative changes right shoulder. No acute abnormality identified. No evidence of fracture dislocation. IMPRESSION: Diffuse osteopenia. Degenerative changes right shoulder. No acute abnormality. Electronically Signed   By: Marcello Moores  Register   On: 08/05/2019 09:54   Dg Hand Complete Left  Result Date: 08/05/2019 CLINICAL DATA:  Fall. EXAM: LEFT HAND - COMPLETE 3+ VIEW COMPARISON:  None. FINDINGS: Negative for fracture Degenerative changes in the PIP and DIP joints. Negative for erosion. IMPRESSION: Negative for fracture. Electronically Signed   By: Franchot Gallo M.D.   On: 08/05/2019 09:54   Ct Maxillofacial Wo Contrast  Result Date: 08/05/2019 CLINICAL DATA:  Fall.  Head injury. EXAM: CT HEAD WITHOUT CONTRAST CT  MAXILLOFACIAL WITHOUT CONTRAST CT CERVICAL SPINE WITHOUT CONTRAST TECHNIQUE: Multidetector CT imaging of the head, cervical spine, and maxillofacial structures were performed using the standard protocol without intravenous contrast. Multiplanar CT image reconstructions of the cervical spine and maxillofacial structures were also generated. COMPARISON:  CT head 02/26/2019 FINDINGS: CT HEAD FINDINGS Brain: Ventricle size normal. Mild white matter changes appear chronic. 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 Negative for acute infarct. Negative for intracranial hemorrhage or mass. No midline shift. Vascular: Negative for hyperdense vessel Skull: Negative for skull fracture Other: Right frontal scalp hematoma. CT MAXILLOFACIAL FINDINGS Osseous: Negative for acute fracture. Chronic nasal bone fracture unchanged. Orbits: Bilateral cataract surgery.  No orbital mass. Sinuses: Mild mucosal edema paranasal sinuses.  No air-fluid level. Soft tissues: Right frontal scalp hematoma. Right Peri orbital soft tissue swelling. CT CERVICAL SPINE FINDINGS Alignment: Mild retrolisthesis C2-3. Mild anterolisthesis C4-5. Moderate levoscoliosis. Skull base and vertebrae: Negative for fracture Soft tissues and spinal canal: No soft tissue swelling or mass. Carotid artery calcification bilaterally with the apparent moderate stenosis bilaterally Disc levels: Multilevel disc and facet degeneration. Facet degeneration most prominent on the right at C3-4 and C4-5 Upper chest: Negative Other: None IMPRESSION: 1. No acute intracranial abnormality 2. Negative for acute facial fracture. Right frontal and right maxillary soft tissue swelling compatible with hematoma/contusion 3. Negative for cervical spine fracture.  Scoliosis and spondylosis. Electronically Signed   By: Franchot Gallo M.D.   On: 08/05/2019 10:33    Pending Labs Unresulted Labs (From admission, onward)    Start     Ordered   08/06/19 6283  Basic metabolic panel   Tomorrow morning,   STAT     08/05/19 1633   08/06/19 0500  CBC  Tomorrow morning,   STAT     08/05/19 1633   08/05/19 1633  Hemoglobin A1c  Add-on,   AD    Comments: To assess  prior glycemic control    08/05/19 1633   08/05/19 1141  Urine culture  ONCE - STAT,   STAT     08/05/19 1140          Vitals/Pain Today's Vitals   08/05/19 1303 08/05/19 1400 08/05/19 1430 08/05/19 1600  BP: (!) 101/52 138/85  129/89  Pulse: 70 95 86 83  Resp: 17 19 13 15   Temp: 98.3 F (36.8 C)     TempSrc: Oral     SpO2: 98% 100% 97% 99%  Weight:      Height:      PainSc:        Isolation Precautions No active isolations  Medications Medications  doxycycline (VIBRAMYCIN) 100 mg in sodium chloride 0.9 % 250 mL IVPB (100 mg Intravenous New Bag/Given 08/05/19 1616)  sodium chloride flush (NS) 0.9 % injection 3 mL (has no administration in time range)  acetaminophen (TYLENOL) tablet 650 mg (has no administration in time range)    Or  acetaminophen (TYLENOL) suppository 650 mg (has no administration in time range)  polyethylene glycol (MIRALAX / GLYCOLAX) packet 17 g (has no administration in time range)  ondansetron (ZOFRAN) tablet 4 mg (has no administration in time range)    Or  ondansetron (ZOFRAN) injection 4 mg (has no administration in time range)  albuterol (PROVENTIL) (2.5 MG/3ML) 0.083% nebulizer solution 2.5 mg (has no administration in time range)  insulin aspart (novoLOG) injection 0-15 Units (has no administration in time range)  insulin aspart (novoLOG) injection 0-5 Units (has no administration in time range)  furosemide (LASIX) injection 60 mg (has no administration in time range)  cefTRIAXone (ROCEPHIN) 1 g in sodium chloride 0.9 % 100 mL IVPB (has no administration in time range)  fentaNYL (SUBLIMAZE) injection 50 mcg (50 mcg Intravenous Given 08/05/19 1029)  fentaNYL (SUBLIMAZE) injection 50 mcg (50 mcg Intravenous Given 08/05/19 1419)  cefTRIAXone (ROCEPHIN) 1 g in sodium  chloride 0.9 % 100 mL IVPB (0 g Intravenous Stopped 08/05/19 1616)    Mobility walks with device High fall risk   Focused Assessments Neuro Assessment Handoff:  Swallow screen pass? n/a         Neuro Assessment:   Neuro Checks:      Last Documented NIHSS Modified Score:   Has TPA been given? No If patient is a Neuro Trauma and patient is going to OR before floor call report to [place] nurse: [phone] or [phone]     R Recommendations: See Admitting Provider Note  Report given to:   Additional Notes:  Pt eating dinner

## 2019-08-05 NOTE — ED Triage Notes (Signed)
Pt arrived via ems from home for report of fall - pt was in rolling chair and fell asleep causing her to fall in floor - she hit her head and has bruising to right eye - pt is A&O x4 - ambulated to toilet to void with one assist - pt is on blood thinner

## 2019-08-05 NOTE — ED Notes (Signed)
Pt voided when she first arrived - ED Tech Sarah emptied urine without obtaining sample - pt is aware that another sample is needed

## 2019-08-05 NOTE — H&P (Signed)
[place] [facility] [place] NAME: Betty [name]    MR#:  [id number]  DATE OF BIRTH:  10-Apr-1946  DATE OF ADMISSION:  08/05/2019  PRIMARY CARE PHYSICIAN: Dion Body, MD   REQUESTING/REFERRING PHYSICIAN: Dr. Jari Pigg  CHIEF COMPLAINT:   Chief Complaint  Patient presents with  . Fall  . Headache  . Neck Pain    HISTORY OF PRESENT ILLNESS:  Betty [name]  is a 73 y.o. female with a known history of diastolic CHF, COPD, diabetes mellitus, hypertension, mitral valve replacement on Coumadin who was sleeping in a chair and rolled and fell.  Patient presented to the emergency room and was found to have leukocytosis along with UTI.  Patient does complain of dysuria.  Has multiple bruises.  Had CT scans and x-rays which showed no fractures.  Complains of pain in bilateral shoulders, headache and left wrist pain since the fall.  She does have chronic shortness of breath.  Recently saw her cardiologist and was found to have fluid overload and started on metolazone twice a week.  PAST MEDICAL HISTORY:   Past Medical History:  Diagnosis Date  . Acute diastolic heart failure ([place])   . Allergy   . 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 Asthma   . Atrial fibrillation ([place])   . Backache, unspecified   . Cancer ([place])    skin  . Cardiomegaly   . COPD (chronic obstructive pulmonary disease) ([place])   . Diabetes mellitus without complication ([place])   . 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 Esophageal reflux   . Headache(784.0)   . Herpes zoster without mention of complication   . Hx: UTI (urinary tract infection)   . Hypertension    heart controlled w CHF  . Nontoxic uninodular goiter   . Obesity, unspecified   . Osteoarthrosis, unspecified whether generalized or localized, unspecified site   . Unspecified sleep apnea   . Urine incontinence    hx of    PAST SURGICAL HISTORY:   Past Surgical History:  Procedure Laterality  Date  . ABDOMINAL HYSTERECTOMY  1979   complete (for precancerous cells)  . ABLATION  2011 & 2014  . bladder botox  01/26/2018  . CHOLECYSTECTOMY    . CYSTOSCOPY WITH FULGERATION N/A 01/28/2018   Procedure: Verdi AND CLOT EVACUATION;  Surgeon: Hollice Espy, MD;  Location: [facility] ORS;  Service: Urology;  Laterality: N/A;  . Interstim Placement  2012  . OOPHORECTOMY      SOCIAL HISTORY:   Social History   Tobacco Use  . Smoking status: Former Smoker    Packs/day: 0.50    Years: 15.00    Pack years: 7.50    Types: Cigarettes  . Smokeless tobacco: Never Used  . Tobacco comment: Has a 20-pack-year history, qutting in 1999  Substance Use Topics  . Alcohol use: No    Alcohol/week: 0.0 standard drinks    FAMILY HISTORY:   Family History  Problem Relation Age of Onset  . Heart attack Father   . Heart failure Father   . Arthritis Father   . Stroke Father   . Hypertension Father   . Coronary artery disease Brother   . Peripheral vascular disease Brother   . Arthritis Mother   . Colon cancer Mother        colon cancer  . Hypertension Mother   . Cancer Maternal Grandmother        colon  cancer  . Arthritis Maternal Grandmother     DRUG ALLERGIES:   Allergies  Allergen Reactions  . Flecainide Shortness Of Breath and Other (See Comments)    Reaction: dizziness   . Amiodarone Other (See Comments)    Pt states that this medication causes lung bleeding.      REVIEW OF SYSTEMS:   Review of Systems  Constitutional: Positive for malaise/fatigue. Negative for chills and fever.  HENT: Negative for sore throat.   Eyes: Negative for blurred vision, double vision and pain.  Respiratory: Positive for cough and shortness of breath. Negative for hemoptysis and wheezing.   Cardiovascular: Negative for chest pain, palpitations, orthopnea and leg swelling.  Gastrointestinal: Negative for abdominal pain, constipation, diarrhea, heartburn, nausea and vomiting.   Genitourinary: Positive for dysuria and frequency. Negative for hematuria.  Musculoskeletal: Negative for back pain and joint pain.  Skin: Negative for rash.  Neurological: Negative for sensory change, speech change, focal weakness and headaches.  Endo/Heme/Allergies: Does not bruise/bleed easily.  Psychiatric/Behavioral: Negative for depression. The patient is not nervous/anxious.     MEDICATIONS AT HOME:   Prior to Admission medications   Medication Sig Start Date End Date Taking? Authorizing Provider  atorvastatin (LIPITOR) 20 MG tablet Take 20 mg by mouth at bedtime.  01/25/19  Yes [provider]  azaTHIOprine (IMURAN) 50 MG tablet Take 1 tablet by mouth 2 (two) times daily. 06/18/19  Yes [provider]  ferrous sulfate 325 (65 FE) MG EC tablet Take 325 mg by mouth daily.  06/23/17  Yes [provider]  gabapentin (NEURONTIN) 300 MG capsule Take 600 mg by mouth 3 (three) times daily. Take 2 capsules (600mg ) in morning, 1 capsule (300mg ) at midday, and 2 capsules (600mg ) at bedtime   Yes [provider]  glipiZIDE (GLUCOTROL XL) 10 MG 24 hr tablet Take 1 tablet by mouth daily. 07/10/19  Yes [provider]  levocetirizine (XYZAL) 5 MG tablet Take 5 mg by mouth at bedtime. 10/15/18  Yes [provider]  metFORMIN (GLUCOPHAGE) 500 MG tablet Take 1 tablet by mouth 2 (two) times daily. 03/08/19  Yes [provider]  metoprolol succinate (TOPROL-XL) 50 MG 24 hr tablet Take 50 mg by mouth daily. 02/22/19  Yes [provider]  pantoprazole (PROTONIX) 40 MG tablet Take 40 mg by mouth daily. 10/15/18  Yes [provider]  potassium chloride (MICRO-K) 10 MEQ CR capsule Take 20 mEq by mouth 2 (two) times daily. 10/26/18  Yes [provider]  rOPINIRole (REQUIP) 2 MG tablet Take 0.5 tablets (1 mg total) by mouth at bedtime. 02/27/19  Yes Paulette Blanch, MD  spironolactone (ALDACTONE) 50 MG tablet Take 75 mg by mouth daily.   01/13/19  Yes [provider]  tiZANidine (ZANAFLEX) 2 MG tablet Take 2 mg by mouth daily as needed.  02/23/19  Yes [provider]  torsemide (DEMADEX) 20 MG tablet Take 100 mg by mouth 2 (two) times daily.  02/01/19  Yes [provider]  traZODone (DESYREL) 50 MG tablet Take 50 mg by mouth at bedtime.  10/26/18  Yes [provider]  venlafaxine XR (EFFEXOR-XR) 150 MG 24 hr capsule Take 150 mg by mouth daily. 10/02/18  Yes [provider]  Vitamin D, Ergocalciferol, (DRISDOL) 50000 units CAPS capsule Take 50,000 Units by mouth every Tuesday.  10/21/18  Yes [provider]  warfarin (COUMADIN) 1 MG tablet Take 8 mg by mouth 2 (two) times a week. Take 8mg  Tuesday and  Thursday   Yes [provider]  warfarin (COUMADIN) 5 MG tablet Take 7 mg by mouth daily at 8 pm. Take 7MG  on Monday,Wednesday,Friday,Saturday, and Sunday   Yes [provider]  acetaminophen (TYLENOL) 325 MG tablet Take 2 tablets (650 mg total) by mouth every 6 (six) hours as needed for mild pain (or Fever >/= 101). Patient not taking: Reported on 08/05/2019 02/04/18   Gladstone Lighter, MD  cefdinir (OMNICEF) 300 MG capsule Take 1 capsule (300 mg total) by mouth every 12 (twelve) hours. Patient not taking: Reported on 01/25/2019 12/29/18   Demetrios Loll, MD  guaiFENesin-dextromethorphan [facility] DM) 100-10 MG/5ML syrup Take 5 mLs by mouth every 4 (four) hours as needed for cough. Patient not taking: Reported on 01/25/2019 12/29/18   Demetrios Loll, MD  metolazone (ZAROXOLYN) 2.5 MG tablet Take 2.5 mg by mouth as needed (fluid retention). Take once weekly as needed 02/16/19   [provider]     VITAL SIGNS:  Blood pressure 138/85, pulse 86, temperature 98.3 F (36.8 C), temperature source Oral, resp. rate 13, height 5\' 9"  (1.753 m), weight 99.8 kg, SpO2 97 %.  PHYSICAL EXAMINATION:  Physical Exam  GENERAL:  73 y.o.-year-old patient lying in the bed with no acute  distress.  EYES: Pupils equal, round, reactive to light and accommodation. No scleral icterus. Extraocular muscles intact.  HEENT: Head atraumatic, normocephalic. Oropharynx and nasopharynx clear. No oropharyngeal erythema, moist oral mucosa  NECK:  Supple, no jugular venous distention. No thyroid enlargement, no tenderness.  LUNGS: Normal work of breathing.  Good air entry bilaterally.  Bilateral crackles. CARDIOVASCULAR: S1, S2 normal. No murmurs, rubs, or gallops.  ABDOMEN: Soft, nontender, nondistended. Bowel sounds present. No organomegaly or mass.  EXTREMITIES: No pedal edema, cyanosis, or clubbing. + 2 pedal & radial pulses b/l.   NEUROLOGIC: Cranial nerves II through XII are intact. No focal Motor or sensory deficits appreciated b/l PSYCHIATRIC: The patient is alert and oriented x 3. Good affect.  SKIN: Bruising on her head, left wrist and right shoulder.  LABORATORY PANEL:   CBC Recent Labs  Lab 08/05/19 0915  WBC 15.2*  HGB 12.9  HCT 39.5  PLT 250   ------------------------------------------------------------------------------------------------------------------  Chemistries  Recent Labs  Lab 08/05/19 0915  NA 135  K 3.8  CL 90*  CO2 29  GLUCOSE 206*  BUN 68*  CREATININE 1.50*  CALCIUM 8.9   ------------------------------------------------------------------------------------------------------------------  Cardiac Enzymes No results for input(s): TROPONINI in the last 168 hours. ------------------------------------------------------------------------------------------------------------------  RADIOLOGY:  Dg Shoulder Right  Result Date: 08/05/2019 CLINICAL DATA:  Fall EXAM: RIGHT SHOULDER - 2+ VIEW COMPARISON:  None. FINDINGS: Normal alignment no fracture. Mild degenerative change in the [facility] joint. IMPRESSION: Negative for fracture. Electronically Signed   By: Franchot Gallo M.D.   On: 08/05/2019 09:52   Ct Head Wo Contrast  Result Date: 08/05/2019 CLINICAL  DATA:  Fall.  Head injury. EXAM: CT HEAD WITHOUT CONTRAST CT MAXILLOFACIAL WITHOUT CONTRAST CT CERVICAL SPINE WITHOUT CONTRAST TECHNIQUE: Multidetector CT imaging of the head, cervical spine, and maxillofacial structures were performed using the standard protocol without intravenous contrast. Multiplanar CT image reconstructions of the cervical spine and maxillofacial structures were also generated. COMPARISON:  CT head 02/26/2019 FINDINGS: CT HEAD FINDINGS Brain: Ventricle size normal. Mild white matter changes appear chronic. 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 Negative for acute infarct. Negative for intracranial hemorrhage or mass. No midline shift. Vascular: Negative for hyperdense vessel Skull: Negative for skull fracture Other: Right frontal scalp  hematoma. CT MAXILLOFACIAL FINDINGS Osseous: Negative for acute fracture. Chronic nasal bone fracture unchanged. Orbits: Bilateral cataract surgery.  No orbital mass. Sinuses: Mild mucosal edema paranasal sinuses.  No air-fluid level. Soft tissues: Right frontal scalp hematoma. Right Peri orbital soft tissue swelling. CT CERVICAL SPINE FINDINGS Alignment: Mild retrolisthesis C2-3. Mild anterolisthesis C4-5. Moderate levoscoliosis. Skull base and vertebrae: Negative for fracture Soft tissues and spinal canal: No soft tissue swelling or mass. Carotid artery calcification bilaterally with the apparent moderate stenosis bilaterally Disc levels: Multilevel disc and facet degeneration. Facet degeneration most prominent on the right at C3-4 and C4-5 Upper chest: Negative Other: None IMPRESSION: 1. No acute intracranial abnormality 2. Negative for acute facial fracture. Right frontal and right maxillary soft tissue swelling compatible with hematoma/contusion 3. Negative for cervical spine fracture.  Scoliosis and spondylosis. Electronically Signed   By: Franchot Gallo M.D.   On: 08/05/2019 10:33   Ct Cervical Spine Wo Contrast  Result Date:  08/05/2019 CLINICAL DATA:  Fall.  Head injury. EXAM: CT HEAD WITHOUT CONTRAST CT MAXILLOFACIAL WITHOUT CONTRAST CT CERVICAL SPINE WITHOUT CONTRAST TECHNIQUE: Multidetector CT imaging of the head, cervical spine, and maxillofacial structures were performed using the standard protocol without intravenous contrast. Multiplanar CT image reconstructions of the cervical spine and maxillofacial structures were also generated. COMPARISON:  CT head 02/26/2019 FINDINGS: CT HEAD FINDINGS Brain: Ventricle size normal. Mild white matter changes appear chronic. 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 Negative for acute infarct. Negative for intracranial hemorrhage or mass. No midline shift. Vascular: Negative for hyperdense vessel Skull: Negative for skull fracture Other: Right frontal scalp hematoma. CT MAXILLOFACIAL FINDINGS Osseous: Negative for acute fracture. Chronic nasal bone fracture unchanged. Orbits: Bilateral cataract surgery.  No orbital mass. Sinuses: Mild mucosal edema paranasal sinuses.  No air-fluid level. Soft tissues: Right frontal scalp hematoma. Right Peri orbital soft tissue swelling. CT CERVICAL SPINE FINDINGS Alignment: Mild retrolisthesis C2-3. Mild anterolisthesis C4-5. Moderate levoscoliosis. Skull base and vertebrae: Negative for fracture Soft tissues and spinal canal: No soft tissue swelling or mass. Carotid artery calcification bilaterally with the apparent moderate stenosis bilaterally Disc levels: Multilevel disc and facet degeneration. Facet degeneration most prominent on the right at C3-4 and C4-5 Upper chest: Negative Other: None IMPRESSION: 1. No acute intracranial abnormality 2. Negative for acute facial fracture. Right frontal and right maxillary soft tissue swelling compatible with hematoma/contusion 3. Negative for cervical spine fracture.  Scoliosis and spondylosis. Electronically Signed   By: Franchot Gallo M.D.   On: 08/05/2019 10:33   Dg Chest Portable 1 View  Result  Date: 08/05/2019 CLINICAL DATA:  Shortness of breath EXAM: PORTABLE CHEST 1 VIEW COMPARISON:  December 26, 2018 FINDINGS: There is cardiomegaly with pulmonary venous hypertension. There is interstitial edema. There is patchy consolidation in the left base with left pleural effusion. There is a pacemaker with lead tips attached to the right atrium and right ventricle. Abandoned pacemaker leads are noted on the left. Patient is status post mitral valve replacement. No adenopathy. Bones are somewhat osteoporotic. There is aortic atherosclerosis. IMPRESSION: Cardiomegaly with pulmonary vascular congestion. There is a degree of interstitial edema and small left pleural effusion. These are findings felt to be indicative of a degree of congestive heart failure. Suspect superimposed pneumonia left base. Status post mitral valve replacement. Pacemaker leads as described. Aortic Atherosclerosis (ICD10-I70.0). Electronically Signed   By: Lowella Grip III M.D.   On: 08/05/2019 12:16   Dg Shoulder Left  Result Date: 08/05/2019 CLINICAL DATA:  Fall  with left shoulder pain EXAM: LEFT SHOULDER - 2+ VIEW COMPARISON:  04/06/2017 FINDINGS: No acute fracture or dislocation. Osteopenia with mild AC joint spurring. IMPRESSION: No acute finding. Prominent osteopenia. Electronically Signed   By: Monte Fantasia M.D.   On: 08/05/2019 09:56   Dg Humerus Right  Result Date: 08/05/2019 CLINICAL DATA:  Fall. EXAM: RIGHT HUMERUS - 2+ VIEW COMPARISON:  05/27/2019. FINDINGS: Diffuse osteopenia. Degenerative changes right shoulder. No acute abnormality identified. No evidence of fracture dislocation. IMPRESSION: Diffuse osteopenia. Degenerative changes right shoulder. No acute abnormality. Electronically Signed   By: Marcello Moores  Register   On: 08/05/2019 09:54   Dg Hand Complete Left  Result Date: 08/05/2019 CLINICAL DATA:  Fall. EXAM: LEFT HAND - COMPLETE 3+ VIEW COMPARISON:  None. FINDINGS: Negative for fracture Degenerative changes in  the PIP and DIP joints. Negative for erosion. IMPRESSION: Negative for fracture. Electronically Signed   By: Franchot Gallo M.D.   On: 08/05/2019 09:54   Ct Maxillofacial Wo Contrast  Result Date: 08/05/2019 CLINICAL DATA:  Fall.  Head injury. EXAM: CT HEAD WITHOUT CONTRAST CT MAXILLOFACIAL WITHOUT CONTRAST CT CERVICAL SPINE WITHOUT CONTRAST TECHNIQUE: Multidetector CT imaging of the head, cervical spine, and maxillofacial structures were performed using the standard protocol without intravenous contrast. Multiplanar CT image reconstructions of the cervical spine and maxillofacial structures were also generated. COMPARISON:  CT head 02/26/2019 FINDINGS: CT HEAD FINDINGS Brain: Ventricle size normal. Mild white matter changes appear chronic. 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 Negative for acute infarct. Negative for intracranial hemorrhage or mass. No midline shift. Vascular: Negative for hyperdense vessel Skull: Negative for skull fracture Other: Right frontal scalp hematoma. CT MAXILLOFACIAL FINDINGS Osseous: Negative for acute fracture. Chronic nasal bone fracture unchanged. Orbits: Bilateral cataract surgery.  No orbital mass. Sinuses: Mild mucosal edema paranasal sinuses.  No air-fluid level. Soft tissues: Right frontal scalp hematoma. Right Peri orbital soft tissue swelling. CT CERVICAL SPINE FINDINGS Alignment: Mild retrolisthesis C2-3. Mild anterolisthesis C4-5. Moderate levoscoliosis. Skull base and vertebrae: Negative for fracture Soft tissues and spinal canal: No soft tissue swelling or mass. Carotid artery calcification bilaterally with the apparent moderate stenosis bilaterally Disc levels: Multilevel disc and facet degeneration. Facet degeneration most prominent on the right at C3-4 and C4-5 Upper chest: Negative Other: None IMPRESSION: 1. No acute intracranial abnormality 2. Negative for acute facial fracture. Right frontal and right maxillary soft tissue swelling compatible with  hematoma/contusion 3. Negative for cervical spine fracture.  Scoliosis and spondylosis. Electronically Signed   By: Franchot Gallo M.D.   On: 08/05/2019 10:33     IMPRESSION AND PLAN:   * UTI with leukocytosis.  Start IV ceftriaxone.  Cultures sent from the emergency room.  Repeat WBC in the morning.  *Acute on chronic diastolic CHF.  Mild fluid overload.  Will give 1 dose of IV Lasix.  Restart her home dose of torsemide and metolazone in the morning.  Fluid restriction ordered.  *Atrial fibrillation mitral valve replacement on Coumadin.  Pharmacy consulted.  Continue rate control medications.  *Hypertension.  Continue medication  *Diabetes mellitus.  Sliding scale insulin ordered.  Diabetic diet  DVT prophylaxis.  On Coumadin.  All the records are reviewed and case discussed with ED provider. Management plans discussed with the patient, family and they are in agreement.  CODE STATUS: Full code  TOTAL TIME TAKING CARE OF THIS PATIENT: 35 minutes.   Neita Carp M.D on 08/05/2019 at 4:38 PM  Between 7am to 6pm - Pager - [phone]  After 6pm go to [email or web address] - password [email or web address] [place]  Office  [phone]  CC: Primary care physician; Dion Body, MD  Note: This dictation was prepared with Dragon dictation along with smaller phrase technology. Any transcriptional errors that result from this process are unintentional.

## 2019-08-05 NOTE — ED Notes (Signed)
Report given to Paige RN.

## 2019-08-05 NOTE — ED Notes (Signed)
Pt given water to drink and is aware that urine sample is needed

## 2019-08-05 NOTE — ED Notes (Signed)
Pt given graham crackers and peanut butter with diet coke

## 2019-08-05 NOTE — ED Provider Notes (Signed)
[facility] Emergency Department Provider Note  ____________________________________________   First MD Initiated Contact with Patient 08/05/19 [phone]     (approximate)  I have reviewed the triage vital signs and the nursing notes.   HISTORY  Chief Complaint Fall, Headache, and Neck Pain    HPI Betty [name] is a 73 y.o. female with diabetes, COPD on 2L, atrial fibrillation on blood thinner who presents with fall.  Patient was sitting in her computer chair when she fell asleep.  Patient then woke up on the ground.  Patient has a hematoma on her head endorsing a headache that is moderate, constant, nothing makes it better, nothing makes it worse.  She has associated bruising around her right eye.  No vision changes.  She also endorses some right arm pain and left shoulder pain.  She does endorse new dysuria as well as a little bit of worsening shortness of breath.          Past Medical History:  Diagnosis Date   Acute diastolic heart failure (HCC)    Allergy    ANCA-associated vasculitis (HCC)    Asthma    Atrial fibrillation (HCC)    Backache, unspecified    Cancer ([place])    skin   Cardiomegaly    COPD (chronic obstructive pulmonary disease) (HCC)    Diabetes mellitus without complication ([place])    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   Esophageal reflux    Headache(784.0)    Herpes zoster without mention of complication    Hx: UTI (urinary tract infection)    Hypertension    heart controlled w CHF   Nontoxic uninodular goiter    Obesity, unspecified    Osteoarthrosis, unspecified whether generalized or localized, unspecified site    Unspecified sleep apnea    Urine incontinence    hx of    Patient Active Problem List   Diagnosis Date Noted   Severe sepsis ([place]) 12/25/2018   Abdominal pain    Goals of care, counseling/discussion    Palliative care by specialist    Respiratory failure,  acute ([place]) 02/26/2018   COPD (chronic obstructive pulmonary disease) ([place]) 02/26/2018   C2 cervical fracture ([place]) 02/19/2018   Hematuria 01/27/2018   Carotid stenosis 12/23/2017   Varicose veins of both lower extremities with pain 11/17/2017   Acute on chronic diastolic CHF (congestive heart failure) ([place]) 08/13/2017   Leg pain 07/14/2017   Chronic venous insufficiency 07/14/2017   PAD (peripheral artery disease) ([place]) 07/14/2017   Postprocedural hemorrhage due to complication of oral surgery 03/27/2017   Acute blood loss anemia 03/27/2017   H/O mitral valve replacement with mechanical valve 03/27/2017   Atypical atrial flutter ([place]) 01/23/2017   Long term current use of anticoagulants with INR goal of 2.5-3.5 09/18/2016   Syncope 06/25/2016   UTI (urinary tract infection) 06/25/2016   Afib ([place]) 05/10/2016   Dyspnea 05/10/2016   Chronic diastolic CHF (congestive heart failure) ([place]) 05/10/2016   Anemia 05/10/2016   Anemia 05/10/2016   Other specified abnormal immunological findings in serum 04/24/2016   Sepsis ([place]) 04/01/2016   Prolonged Q-T interval on ECG 03/13/2016   Mitral stenosis 03/13/2016   Interstitial lung disease ([place]) 12/29/2015   Chronic LBP 07/26/2015   Essential (primary) hypertension 07/26/2015   Idiopathic insomnia 07/26/2015   Restless leg 07/26/2015   Obesity (BMI 30-39.9) 01/07/2015   Abnormal EKG 09/11/2014   External nasal lesion 02/26/2014   Abnormal liver function  tests 01/28/2014   Ankle pain, right 01/28/2014   Arthralgia of ankle or foot 01/28/2014   Urinary frequency 12/24/2013   Hemorrhoids 12/24/2013   Neck pain on right side 11/10/2013   Vitamin D deficiency 09/09/2013   Urinary incontinence 05/30/2013   History of colonic polyps 05/30/2013   Controlled type 2 diabetes mellitus without complication ([place]) 86/76/1950   H/O deep venous thrombosis 05/05/2013   History of pulmonary embolism  05/05/2013   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   OSA (obstructive sleep apnea) 05/05/2013   Depression 04/13/2009   THYROID NODULE 04/13/2009   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) 03/28/2009    Past Surgical History:  Procedure Laterality Date   ABDOMINAL HYSTERECTOMY  1979   complete (for precancerous cells)   ABLATION  2011 & 2014   bladder botox  01/26/2018   CHOLECYSTECTOMY     CYSTOSCOPY WITH FULGERATION N/A 01/28/2018   Procedure: CYSTOSCOPY WITH FULGERATION AND CLOT EVACUATION;  Surgeon: Hollice Espy, MD;  Location: [facility] ORS;  Service: Urology;  Laterality: N/A;   Interstim Placement  2012   OOPHORECTOMY      Prior to Admission medications   Medication Sig Start Date End Date Taking? Authorizing Provider  acetaminophen (TYLENOL) 325 MG tablet Take 2 tablets (650 mg total) by mouth every 6 (six) hours as needed for mild pain (or Fever >/= 101). 02/04/18   Gladstone Lighter, MD  atorvastatin (LIPITOR) 20 MG tablet Take 20 mg by mouth at bedtime.  01/25/19   [provider]  cefdinir (OMNICEF) 300 MG capsule Take 1 capsule (300 mg total) by mouth every 12 (twelve) hours. Patient not taking: Reported on 01/25/2019 12/29/18   Demetrios Loll, MD  ferrous sulfate 325 (65 FE) MG EC tablet Take 325 mg by mouth daily.  06/23/17   [provider]  gabapentin (NEURONTIN) 300 MG capsule Take 600 mg by mouth 3 (three) times daily. Take 2 capsules (600mg ) in morning, 1 capsule (300mg ) at  midday, and 2 capsules (600mg ) at bedtime    [provider]  guaiFENesin-dextromethorphan (ROBITUSSIN DM) 100-10 MG/5ML syrup Take 5 mLs by mouth every 4 (four) hours as needed for cough. Patient not taking: Reported on 01/25/2019 12/29/18   Demetrios Loll, MD  levocetirizine (XYZAL) 5 MG tablet Take 5 mg by mouth at bedtime. 10/15/18   [provider]  metFORMIN (GLUCOPHAGE) 1000 MG tablet Take 500 mg by mouth 2 (two) times daily with a meal.     [provider]  metolazone (ZAROXOLYN) 2.5 MG tablet Take 2.5 mg by mouth as needed (fluid retention). Take once weekly as needed 02/16/19   [provider]  metoprolol succinate (TOPROL-XL) 50 MG 24 hr tablet Take 50 mg by mouth daily. 02/22/19   [provider]  pantoprazole (PROTONIX) 40 MG tablet Take 40 mg by mouth daily. 10/15/18   [provider]  potassium chloride (MICRO-K) 10 MEQ CR capsule Take 20 mEq by mouth 2 (two) times daily. 10/26/18   [provider]  rOPINIRole (REQUIP) 2 MG tablet Take 0.5 tablets (1 mg total) by mouth at bedtime. 02/27/19  Paulette Blanch, MD  spironolactone (ALDACTONE) 50 MG tablet Take 50 mg by mouth daily. 01/13/19   [provider]  tiZANidine (ZANAFLEX) 2 MG tablet Take 2 mg by mouth at bedtime.  02/23/19   [provider]  torsemide (DEMADEX) 20 MG tablet Take 80 mg by mouth 2 (two) times daily. 02/01/19   [provider]  traZODone (DESYREL) 50 MG tablet Take 50 mg by mouth at bedtime.  10/26/18   [provider]  venlafaxine XR (EFFEXOR-XR) 150 MG 24 hr capsule Take 150 mg by mouth daily. 10/02/18   [provider]  Vitamin D, Ergocalciferol, (DRISDOL) 50000 units CAPS capsule Take 50,000 Units by mouth every Tuesday.  10/21/18   [provider]  warfarin (COUMADIN) 3 MG tablet Take 3 mg by mouth at bedtime. Takes with 4mg  tablet for total dose of 7mg     [provider]  warfarin (COUMADIN) 4 MG tablet  Take 4 mg by mouth at bedtime. Takes with 3mg  tablet for total dose of 7mg     [provider]    Allergies Flecainide and Amiodarone  Family History  Problem Relation Age of Onset   Heart attack Father    Heart failure Father    Arthritis Father    Stroke Father    Hypertension Father    Coronary artery disease Brother    Peripheral vascular disease Brother    Arthritis Mother    Colon cancer Mother        colon cancer   Hypertension Mother    Cancer Maternal Grandmother        colon cancer   Arthritis Maternal Grandmother     Social History Social History   Tobacco Use   Smoking status: Former Smoker    Packs/day: 0.50    Years: 15.00    Pack years: 7.50    Types: Cigarettes   Smokeless tobacco: Never Used   Tobacco comment: Has a 20-pack-year history, qutting in 1999  Substance Use Topics   Alcohol use: No    Alcohol/week: 0.0 standard drinks   Drug use: No      Review of Systems Constitutional: No fever/chills, positive fall Eyes: No visual changes. ENT: No sore throat. Cardiovascular: Denies chest pain. Respiratory: Positive shortness of breath Gastrointestinal: No abdominal pain.  No nausea, no vomiting.  No diarrhea.  No constipation. Genitourinary: Positive dysuria Musculoskeletal: Negative for back pain.  Positive right-arm and left shoulder pain Skin: Negative for rash. Neurological: Positive headache after fall, focal weakness or numbness. All other ROS negative ____________________________________________   PHYSICAL EXAM:  VITAL SIGNS: Blood pressure 128/78, pulse 83, temperature 97.9 F (36.6 C), temperature source Oral, resp. rate 14, height 5\' 9"  (1.753 m), weight 99.8 kg, SpO2 99 %.  Constitutional: Alert and oriented. GCS 15  Eyes: Conjunctivae are normal. EOMI. Head: Bruising on the left side of her head and around the eye Nose: No congestion/rhinnorhea. Mouth/Throat: Mucous membranes are moist.   Neck: No  stridor. Trachea Midline. FROM Cardiovascular: Normal rate, regular rhythm. Grossly normal heart sounds.  Good peripheral circulation. No chest wall tenderness Respiratory: Normal respiratory effort.  No retractions. Lungs CTAB. Gastrointestinal: Soft and nontender. No distention. No abdominal bruits.  Musculoskeletal:   RUE: Tenderness on the right upper arm radial pulse intact. Neuro intact. Full ROM in joint. LUE: Tenderness on the shoulder with some bruising radial pulse intact. Neuro intact. Full ROM in joints RLE: No point tenderness, deformity or other signs of injury. DP pulse  intact. Neuro intact. Full ROM in joints. LLE: No point tenderness, deformity or other signs of injury. DP pulse intact. Neuro intact. Full ROM in joints. Neurologic:  Normal speech and language. No gross focal neurologic deficits are appreciated.  Skin:  Skin is warm, dry and intact. No rash noted. Psychiatric: Mood and affect are normal. Speech and behavior are normal. GU: Deferred   ____________________________________________   LABS (all labs ordered are listed, but only abnormal results are displayed)  Labs Reviewed  CBC WITH DIFFERENTIAL/PLATELET - Abnormal; Notable for the following components:      Result Value   WBC 15.2 (*)    Neutro Abs 13.5 (*)    Lymphs Abs 0.3 (*)    Monocytes Absolute 1.1 (*)    Abs Immature Granulocytes 0.18 (*)    All other components within normal limits  BASIC METABOLIC PANEL - Abnormal; Notable for the following components:   Chloride 90 (*)    Glucose, Bld 206 (*)    BUN 68 (*)    Creatinine, Ser 1.50 (*)    GFR calc non Af Amer 34 (*)    GFR calc Af Amer 40 (*)    Anion gap 16 (*)    All other components within normal limits  PROTIME-INR - Abnormal; Notable for the following components:   Prothrombin Time 32.1 (*)    INR 3.2 (*)    All other components within normal limits  APTT - Abnormal; Notable for the following components:   aPTT 41 (*)    All other  components within normal limits  URINALYSIS, ROUTINE W REFLEX MICROSCOPIC - Abnormal; Notable for the following components:   Color, Urine YELLOW (*)    APPearance CLOUDY (*)    Leukocytes,Ua LARGE (*)    WBC, UA >50 (*)    Bacteria, UA MANY (*)    All other components within normal limits  SARS CORONAVIRUS 2 (HOSPITAL ORDER, [facility])  URINE CULTURE   ____________________________________________   ED ECG REPORT I, Vanessa [place], the attending physician, personally viewed and interpreted this ECG.  EKG shows an atrial sensed complex at 92, no ST elevation, no T wave inversion, QTC at 493 ____________________________________________  RADIOLOGY I, Vanessa [place], personally viewed and evaluated these images (plain radiographs) as part of my medical decision making, as well as reviewing the written report by the radiologist.  ED MD interpretation: X-rays without fracture  Official radiology report(s): Dg Shoulder Right  Result Date: 08/05/2019 CLINICAL DATA:  Fall EXAM: RIGHT SHOULDER - 2+ VIEW COMPARISON:  None. FINDINGS: Normal alignment no fracture. Mild degenerative change in the [facility] joint. IMPRESSION: Negative for fracture. Electronically Signed   By: Franchot Gallo M.D.   On: 08/05/2019 09:52   Ct Head Wo Contrast  Result Date: 08/05/2019 CLINICAL DATA:  Fall.  Head injury. EXAM: CT HEAD WITHOUT CONTRAST CT MAXILLOFACIAL WITHOUT CONTRAST CT CERVICAL SPINE WITHOUT CONTRAST TECHNIQUE: Multidetector CT imaging of the head, cervical spine, and maxillofacial structures were performed using the standard protocol without intravenous contrast. Multiplanar CT image reconstructions of the cervical spine and maxillofacial structures were also generated. COMPARISON:  CT head 02/26/2019 FINDINGS: CT HEAD FINDINGS Brain: Ventricle size normal. Mild white matter changes appear chronic. 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 Negative for acute infarct.  Negative for intracranial hemorrhage or mass. No midline shift. Vascular: Negative for hyperdense vessel Skull: Negative for skull fracture Other: Right frontal scalp hematoma. CT MAXILLOFACIAL FINDINGS Osseous: Negative for acute  fracture. Chronic nasal bone fracture unchanged. Orbits: Bilateral cataract surgery.  No orbital mass. Sinuses: Mild mucosal edema paranasal sinuses.  No air-fluid level. Soft tissues: Right frontal scalp hematoma. Right Peri orbital soft tissue swelling. CT CERVICAL SPINE FINDINGS Alignment: Mild retrolisthesis C2-3. Mild anterolisthesis C4-5. Moderate levoscoliosis. Skull base and vertebrae: Negative for fracture Soft tissues and spinal canal: No soft tissue swelling or mass. Carotid artery calcification bilaterally with the apparent moderate stenosis bilaterally Disc levels: Multilevel disc and facet degeneration. Facet degeneration most prominent on the right at C3-4 and C4-5 Upper chest: Negative Other: None IMPRESSION: 1. No acute intracranial abnormality 2. Negative for acute facial fracture. Right frontal and right maxillary soft tissue swelling compatible with hematoma/contusion 3. Negative for cervical spine fracture.  Scoliosis and spondylosis. Electronically Signed   By: Franchot Gallo M.D.   On: 08/05/2019 10:33   Ct Cervical Spine Wo Contrast  Result Date: 08/05/2019 CLINICAL DATA:  Fall.  Head injury. EXAM: CT HEAD WITHOUT CONTRAST CT MAXILLOFACIAL WITHOUT CONTRAST CT CERVICAL SPINE WITHOUT CONTRAST TECHNIQUE: Multidetector CT imaging of the head, cervical spine, and maxillofacial structures were performed using the standard protocol without intravenous contrast. Multiplanar CT image reconstructions of the cervical spine and maxillofacial structures were also generated. COMPARISON:  CT head 02/26/2019 FINDINGS: CT HEAD FINDINGS Brain: Ventricle size normal. Mild white matter changes appear chronic. 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 Negative for acute  infarct. Negative for intracranial hemorrhage or mass. No midline shift. Vascular: Negative for hyperdense vessel Skull: Negative for skull fracture Other: Right frontal scalp hematoma. CT MAXILLOFACIAL FINDINGS Osseous: Negative for acute fracture. Chronic nasal bone fracture unchanged. Orbits: Bilateral cataract surgery.  No orbital mass. Sinuses: Mild mucosal edema paranasal sinuses.  No air-fluid level. Soft tissues: Right frontal scalp hematoma. Right Peri orbital soft tissue swelling. CT CERVICAL SPINE FINDINGS Alignment: Mild retrolisthesis C2-3. Mild anterolisthesis C4-5. Moderate levoscoliosis. Skull base and vertebrae: Negative for fracture Soft tissues and spinal canal: No soft tissue swelling or mass. Carotid artery calcification bilaterally with the apparent moderate stenosis bilaterally Disc levels: Multilevel disc and facet degeneration. Facet degeneration most prominent on the right at C3-4 and C4-5 Upper chest: Negative Other: None IMPRESSION: 1. No acute intracranial abnormality 2. Negative for acute facial fracture. Right frontal and right maxillary soft tissue swelling compatible with hematoma/contusion 3. Negative for cervical spine fracture.  Scoliosis and spondylosis. Electronically Signed   By: Franchot Gallo M.D.   On: 08/05/2019 10:33   Dg Chest Portable 1 View  Result Date: 08/05/2019 CLINICAL DATA:  Shortness of breath EXAM: PORTABLE CHEST 1 VIEW COMPARISON:  December 26, 2018 FINDINGS: There is cardiomegaly with pulmonary venous hypertension. There is interstitial edema. There is patchy consolidation in the left base with left pleural effusion. There is a pacemaker with lead tips attached to the right atrium and right ventricle. Abandoned pacemaker leads are noted on the left. Patient is status post mitral valve replacement. No adenopathy. Bones are somewhat osteoporotic. There is aortic atherosclerosis. IMPRESSION: Cardiomegaly with pulmonary vascular congestion. There is a degree of  interstitial edema and small left pleural effusion. These are findings felt to be indicative of a degree of congestive heart failure. Suspect superimposed pneumonia left base. Status post mitral valve replacement. Pacemaker leads as described. Aortic Atherosclerosis (ICD10-I70.0). Electronically Signed   By: Lowella Grip III M.D.   On: 08/05/2019 12:16   Dg Shoulder Left  Result Date: 08/05/2019 CLINICAL DATA:  Fall with left shoulder pain EXAM: LEFT SHOULDER -  2+ VIEW COMPARISON:  04/06/2017 FINDINGS: No acute fracture or dislocation. Osteopenia with mild AC joint spurring. IMPRESSION: No acute finding. Prominent osteopenia. Electronically Signed   By: Monte Fantasia M.D.   On: 08/05/2019 09:56   Dg Humerus Right  Result Date: 08/05/2019 CLINICAL DATA:  Fall. EXAM: RIGHT HUMERUS - 2+ VIEW COMPARISON:  05/27/2019. FINDINGS: Diffuse osteopenia. Degenerative changes right shoulder. No acute abnormality identified. No evidence of fracture dislocation. IMPRESSION: Diffuse osteopenia. Degenerative changes right shoulder. No acute abnormality. Electronically Signed   By: Marcello Moores  Register   On: 08/05/2019 09:54   Dg Hand Complete Left  Result Date: 08/05/2019 CLINICAL DATA:  Fall. EXAM: LEFT HAND - COMPLETE 3+ VIEW COMPARISON:  None. FINDINGS: Negative for fracture Degenerative changes in the PIP and DIP joints. Negative for erosion. IMPRESSION: Negative for fracture. Electronically Signed   By: Franchot Gallo M.D.   On: 08/05/2019 09:54   Ct Maxillofacial Wo Contrast  Result Date: 08/05/2019 CLINICAL DATA:  Fall.  Head injury. EXAM: CT HEAD WITHOUT CONTRAST CT MAXILLOFACIAL WITHOUT CONTRAST CT CERVICAL SPINE WITHOUT CONTRAST TECHNIQUE: Multidetector CT imaging of the head, cervical spine, and maxillofacial structures were performed using the standard protocol without intravenous contrast. Multiplanar CT image reconstructions of the cervical spine and maxillofacial structures were also generated.  COMPARISON:  CT head 02/26/2019 FINDINGS: CT HEAD FINDINGS Brain: Ventricle size normal. Mild white matter changes appear chronic. 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 Negative for acute infarct. Negative for intracranial hemorrhage or mass. No midline shift. Vascular: Negative for hyperdense vessel Skull: Negative for skull fracture Other: Right frontal scalp hematoma. CT MAXILLOFACIAL FINDINGS Osseous: Negative for acute fracture. Chronic nasal bone fracture unchanged. Orbits: Bilateral cataract surgery.  No orbital mass. Sinuses: Mild mucosal edema paranasal sinuses.  No air-fluid level. Soft tissues: Right frontal scalp hematoma. Right Peri orbital soft tissue swelling. CT CERVICAL SPINE FINDINGS Alignment: Mild retrolisthesis C2-3. Mild anterolisthesis C4-5. Moderate levoscoliosis. Skull base and vertebrae: Negative for fracture Soft tissues and spinal canal: No soft tissue swelling or mass. Carotid artery calcification bilaterally with the apparent moderate stenosis bilaterally Disc levels: Multilevel disc and facet degeneration. Facet degeneration most prominent on the right at C3-4 and C4-5 Upper chest: Negative Other: None IMPRESSION: 1. No acute intracranial abnormality 2. Negative for acute facial fracture. Right frontal and right maxillary soft tissue swelling compatible with hematoma/contusion 3. Negative for cervical spine fracture.  Scoliosis and spondylosis. Electronically Signed   By: Franchot Gallo M.D.   On: 08/05/2019 10:33    ____________________________________________   PROCEDURES  Procedure(s) performed (including Critical Care):  Procedures   ____________________________________________   INITIAL IMPRESSION / ASSESSMENT AND PLAN / ED COURSE   Betty [name] was evaluated in Emergency Department on 08/05/2019 for the symptoms described in the history of present illness. She was evaluated in the context of the global COVID-19 pandemic, which necessitated  consideration that the patient might be at risk for infection with the SARS-CoV-2 virus that causes COVID-19. Institutional protocols and algorithms that pertain to the evaluation of patients at risk for COVID-19 are in a state of rapid change based on information released by regulatory bodies including the CDC and federal and state organizations. These policies and algorithms were followed during the patient's care in the ED.     Patient is a well-appearing 73 year old who presents after a fall with bruising on the left side of her face.  Will get CT head to evaluate for epidural subdural hematoma given patient is on  a blood thinner.  Will get CT cervical to evaluate for cervical fracture and CT face to evaluate for facial fracture.  Also get x-rays to evaluate for fractures in the areas that she is having pain.  Will get basic labs to evaluate for AKI and electrolyte abnormalities.  Given patient's dysuria will get UA given her shortness of breath will get chest x-ray  INR is 3.2  Patient has a slightly elevated creatinine at 1.5 and glucose of 206.  Patient's anion gap is slightly elevated at 16.  Patient denies symptoms to suggest DKA.  Patient's white count is also elevated at 15.2  CT scans are negative.  X-rays are otherwise negative for fractures.  Reevaluated patient.  Patient's urine is consistent with UTI and her chest x-ray is concerning for possible pneumonia patient does endorse a little bit more shortness of breath from baseline.  Patient is placed on ceftriaxone and doxycycline.  Discussed with patient and given her elevated white count in the setting of infection will admit her to the hospital.  She also has a small AKI unclear if this is secondary to over diuresis although her chest x-ray actually looks worse today with edema. ____________________________________________   FINAL CLINICAL IMPRESSION(S) / ED DIAGNOSES   Final diagnoses:  Acute cystitis without hematuria    Community acquired pneumonia, unspecified laterality      MEDICATIONS GIVEN DURING THIS VISIT:  Medications  cefTRIAXone (ROCEPHIN) 1 g in sodium chloride 0.9 % 100 mL IVPB (1 g Intravenous New Bag/Given 08/05/19 1522)  doxycycline (VIBRAMYCIN) 100 mg in sodium chloride 0.9 % 250 mL IVPB (has no administration in time range)  fentaNYL (SUBLIMAZE) injection 50 mcg (50 mcg Intravenous Given 08/05/19 1029)  fentaNYL (SUBLIMAZE) injection 50 mcg (50 mcg Intravenous Given 08/05/19 1419)     ED Discharge Orders    None       Note:  This document was prepared using Dragon voice recognition software and may include unintentional dictation errors.   Vanessa Weston, MD 08/05/19 1536

## 2019-08-06 DIAGNOSIS — N179 Acute kidney failure, unspecified: Secondary | ICD-10-CM | POA: Diagnosis present

## 2019-08-06 DIAGNOSIS — R2681 Unsteadiness on feet: Secondary | ICD-10-CM | POA: Diagnosis present

## 2019-08-06 DIAGNOSIS — W07XXXA Fall from chair, initial encounter: Secondary | ICD-10-CM | POA: Diagnosis present

## 2019-08-06 DIAGNOSIS — E041 Nontoxic single thyroid nodule: Secondary | ICD-10-CM | POA: Diagnosis present

## 2019-08-06 DIAGNOSIS — B9689 Other specified bacterial agents as the cause of diseases classified elsewhere: Secondary | ICD-10-CM | POA: Diagnosis present

## 2019-08-06 DIAGNOSIS — Y9384 Activity, sleeping: Secondary | ICD-10-CM | POA: Diagnosis not present

## 2019-08-06 DIAGNOSIS — E785 Hyperlipidemia, unspecified: Secondary | ICD-10-CM | POA: Diagnosis present

## 2019-08-06 DIAGNOSIS — Z9981 Dependence on supplemental oxygen: Secondary | ICD-10-CM | POA: Diagnosis not present

## 2019-08-06 DIAGNOSIS — M25532 Pain in left wrist: Secondary | ICD-10-CM | POA: Diagnosis present

## 2019-08-06 DIAGNOSIS — M25511 Pain in right shoulder: Secondary | ICD-10-CM | POA: Diagnosis present

## 2019-08-06 DIAGNOSIS — G4733 Obstructive sleep apnea (adult) (pediatric): Secondary | ICD-10-CM | POA: Diagnosis present

## 2019-08-06 DIAGNOSIS — M1990 Unspecified osteoarthritis, unspecified site: Secondary | ICD-10-CM | POA: Diagnosis present

## 2019-08-06 DIAGNOSIS — I110 Hypertensive heart disease with heart failure: Secondary | ICD-10-CM | POA: Diagnosis present

## 2019-08-06 DIAGNOSIS — E1151 Type 2 diabetes mellitus with diabetic peripheral angiopathy without gangrene: Secondary | ICD-10-CM | POA: Diagnosis present

## 2019-08-06 DIAGNOSIS — S0011XA Contusion of right eyelid and periocular area, initial encounter: Secondary | ICD-10-CM | POA: Diagnosis present

## 2019-08-06 DIAGNOSIS — J449 Chronic obstructive pulmonary disease, unspecified: Secondary | ICD-10-CM | POA: Diagnosis present

## 2019-08-06 DIAGNOSIS — M542 Cervicalgia: Secondary | ICD-10-CM | POA: Diagnosis present

## 2019-08-06 DIAGNOSIS — I480 Paroxysmal atrial fibrillation: Secondary | ICD-10-CM | POA: Diagnosis present

## 2019-08-06 DIAGNOSIS — E559 Vitamin D deficiency, unspecified: Secondary | ICD-10-CM | POA: Diagnosis present

## 2019-08-06 DIAGNOSIS — N390 Urinary tract infection, site not specified: Secondary | ICD-10-CM | POA: Diagnosis present

## 2019-08-06 DIAGNOSIS — I5033 Acute on chronic diastolic (congestive) heart failure: Secondary | ICD-10-CM | POA: Diagnosis present

## 2019-08-06 DIAGNOSIS — Z20828 Contact with and (suspected) exposure to other viral communicable diseases: Secondary | ICD-10-CM | POA: Diagnosis present

## 2019-08-06 DIAGNOSIS — D509 Iron deficiency anemia, unspecified: Secondary | ICD-10-CM | POA: Diagnosis present

## 2019-08-06 DIAGNOSIS — K219 Gastro-esophageal reflux disease without esophagitis: Secondary | ICD-10-CM | POA: Diagnosis present

## 2019-08-06 DIAGNOSIS — Z23 Encounter for immunization: Secondary | ICD-10-CM | POA: Diagnosis not present

## 2019-08-06 DIAGNOSIS — S0083XA Contusion of other part of head, initial encounter: Secondary | ICD-10-CM | POA: Diagnosis present

## 2019-08-06 DIAGNOSIS — M25512 Pain in left shoulder: Secondary | ICD-10-CM | POA: Diagnosis present

## 2019-08-06 DIAGNOSIS — I872 Venous insufficiency (chronic) (peripheral): Secondary | ICD-10-CM | POA: Diagnosis present

## 2019-08-06 DIAGNOSIS — Y92009 Unspecified place in unspecified non-institutional (private) residence as the place of occurrence of the external cause: Secondary | ICD-10-CM | POA: Diagnosis not present

## 2019-08-06 LAB — PROTIME-INR
INR: 3.1 — ABNORMAL HIGH (ref 0.8–1.2)
Prothrombin Time: 31.1 seconds — ABNORMAL HIGH (ref 11.4–15.2)

## 2019-08-06 LAB — BASIC METABOLIC PANEL
Anion gap: 11 (ref 5–15)
BUN: 58 mg/dL — ABNORMAL HIGH (ref 8–23)
CO2: 32 mmol/L (ref 22–32)
Calcium: 8.4 mg/dL — ABNORMAL LOW (ref 8.9–10.3)
Chloride: 93 mmol/L — ABNORMAL LOW (ref 98–111)
Creatinine, Ser: 1.22 mg/dL — ABNORMAL HIGH (ref 0.44–1.00)
GFR calc Af Amer: 51 mL/min — ABNORMAL LOW (ref >60)
GFR calc non Af Amer: 44 mL/min — ABNORMAL LOW (ref >60)
Glucose, Bld: 145 mg/dL — ABNORMAL HIGH (ref 70–99)
Potassium: 3.9 mmol/L (ref 3.5–5.1)
Sodium: 136 mmol/L (ref 135–145)

## 2019-08-06 LAB — CBC
HCT: 36.2 % (ref 36.0–46.0)
Hemoglobin: 11.6 g/dL — ABNORMAL LOW (ref 12.0–15.0)
MCH: 30.1 pg (ref 26.0–34.0)
MCHC: 32 g/dL (ref 30.0–36.0)
MCV: 93.8 fL (ref 80.0–100.0)
Platelets: 218 10*3/uL (ref 150–400)
RBC: 3.86 MIL/uL — ABNORMAL LOW (ref 3.87–5.11)
RDW: 15.5 % (ref 11.5–15.5)
WBC: 10 10*3/uL (ref 4.0–10.5)
nRBC: 0 % (ref 0.0–0.2)

## 2019-08-06 LAB — GLUCOSE, CAPILLARY
Glucose-Capillary: 216 mg/dL — ABNORMAL HIGH (ref 70–99)
Glucose-Capillary: 150 mg/dL — ABNORMAL HIGH (ref 70–99)
Glucose-Capillary: 270 mg/dL — ABNORMAL HIGH (ref 70–99)
Glucose-Capillary: 164 mg/dL — ABNORMAL HIGH (ref 70–99)
Glucose-Capillary: 220 mg/dL — ABNORMAL HIGH (ref 70–99)

## 2019-08-06 MED ORDER — ROPINIROLE HCL 1 MG PO TABS: 2.0000 mg | ORAL_TABLET | Freq: Every day | ORAL | Status: DC

## 2019-08-06 MED ORDER — WARFARIN SODIUM 6 MG PO TABS
7.0000 mg | ORAL_TABLET | Freq: Once | ORAL | Status: AC
  Administered 2019-08-06: 18:00:00 7 mg via ORAL
  Filled 2019-08-06: qty 1

## 2019-08-06 MED ORDER — ROPINIROLE HCL 1 MG PO TABS
2.0000 mg | ORAL_TABLET | Freq: Every day | ORAL | Status: DC
  Administered 2019-08-06: 15:00:00 2 mg via ORAL
  Filled 2019-08-06 (×2): qty 2

## 2019-08-06 NOTE — Progress Notes (Signed)
[place] for warfarin Indication: atrial fibrillation  Allergies  Allergen Reactions  . Flecainide Shortness Of Breath and Other (See Comments)    Reaction: dizziness   . Amiodarone Other (See Comments)    Pt states that this medication causes lung bleeding.      Patient Measurements: Height: 5\' 9"  (175.3 cm) Weight: 220 lb (99.8 kg) IBW/kg (Calculated) : 66.2  Vital Signs: Temp: 97.4 F (36.3 C) (08/14 0625) Temp Source: Oral (08/14 0625) BP: 100/78 (08/14 0625) Pulse Rate: 72 (08/14 0625)  Labs: Recent Labs    08/05/19 0915 08/06/19 0534  HGB 12.9 11.6*  HCT 39.5 36.2  PLT 250 218  APTT 41*  --   LABPROT 32.1* 31.1*  INR 3.2* 3.1*  CREATININE 1.50* 1.22*    Estimated Creatinine Clearance: 52.4 mL/min (A) (by C-G formula based on SCr of 1.22 mg/dL (H)).   Medical History: Past Medical History:  Diagnosis Date  . Acute diastolic heart failure ([place])   . Allergy   . 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 Asthma   . Atrial fibrillation ([place])   . Backache, unspecified   . Cancer ([place])    skin  . Cardiomegaly   . COPD (chronic obstructive pulmonary disease) ([place])   . Diabetes mellitus without complication ([employer])   . 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 Esophageal reflux   . Headache(784.0)   . Herpes zoster without mention of complication   . Hx: UTI (urinary tract infection)   . Hypertension    heart controlled w CHF  . Nontoxic uninodular goiter   . Obesity, unspecified   . Osteoarthrosis, unspecified whether generalized or localized, unspecified site   . Unspecified sleep apnea   . Urine incontinence    hx of    Assessment: 73 year old female with h/o afib on warfarin PTA with mechanical valve replacement. Home dose 7 mg all days except 8 mg on Tuesday and Thursday. Patient here for UTI and fall. Imaging negative for bleeding. Pharmacy consulted for warfarin  dosing.  Date INR Dose 8/14 3.1  Goal of Therapy:  INR Goal: 2.5-3.5 Monitor platelets by anticoagulation protocol: Yes   Plan:  8/14 INR 3.1 wnl.  Will give 7 mg home dose tonight.  Reassess INR with morning labs.  Gerald Dexter, PharmD Pharmacy Resident  08/06/2019 8:21 AM

## 2019-08-06 NOTE — Progress Notes (Addendum)
[facility] [place] NAME: Betty [name]    MR#:  [id number]  DATE OF BIRTH:  January 20, 1946  SUBJECTIVE:  CHIEF COMPLAINT:   Chief Complaint  Patient presents with   Fall   Headache   Neck Pain  Patient seen and evaluated today Has some facial pain secondary to fall No chest pain No shortness of breath  REVIEW OF SYSTEMS:    ROS  CONSTITUTIONAL: No documented fever. No fatigue, weakness. No weight gain, no weight loss.  EYES: No blurry or double vision.  ENT: No tinnitus. No postnasal drip. No redness of the oropharynx.  RESPIRATORY: No cough, no wheeze, no hemoptysis. No dyspnea.  CARDIOVASCULAR: No chest pain. No orthopnea. No palpitations. No syncope.  GASTROINTESTINAL: No nausea, no vomiting or diarrhea. No abdominal pain. No melena or hematochezia.  GENITOURINARY: No dysuria or hematuria.  ENDOCRINE: No polyuria or nocturia. No heat or cold intolerance.  HEMATOLOGY: No anemia. No bruising. No bleeding.  INTEGUMENTARY: No rashes.  Has ecchymosis over the face MUSCULOSKELETAL: No arthritis. No swelling. No gout.  NEUROLOGIC: No numbness, tingling, or ataxia. No seizure-type activity.  PSYCHIATRIC: No anxiety. No insomnia. No ADD.   DRUG ALLERGIES:   Allergies  Allergen Reactions   Flecainide Shortness Of Breath and Other (See Comments)    Reaction: dizziness    Amiodarone Other (See Comments)    Pt states that this medication causes lung bleeding.      VITALS:  Blood pressure 100/78, pulse 72, temperature (!) 97.4 F (36.3 C), temperature source Oral, resp. rate 18, height 5\' 9"  (1.753 m), weight 99.8 kg, SpO2 92 %.  PHYSICAL EXAMINATION:   Physical Exam  GENERAL:  73 y.o.-year-old patient lying in the bed with no acute distress.  EYES: Pupils equal, round, reactive to light and accommodation. No scleral icterus. Extraocular muscles intact.  HEENT: Head atraumatic, normocephalic. Oropharynx and nasopharynx clear.    NECK:  Supple, no jugular venous distention. No thyroid enlargement, no tenderness.  LUNGS: Normal breath sounds bilaterally, no wheezing, rales, rhonchi. No use of accessory muscles of respiration.  CARDIOVASCULAR: S1, S2 normal. No murmurs, rubs, or gallops.  ABDOMEN: Soft, nontender, nondistended. Bowel sounds present. No organomegaly or mass.  EXTREMITIES: No cyanosis, clubbing or edema b/l.    NEUROLOGIC: Cranial nerves II through XII are intact. No focal Motor or sensory deficits b/l.   PSYCHIATRIC: The patient is alert and oriented x 3.  SKIN: Ecchymosis over the face     LABORATORY PANEL:   CBC Recent Labs  Lab 08/06/19 0534  WBC 10.0  HGB 11.6*  HCT 36.2  PLT 218   ------------------------------------------------------------------------------------------------------------------ Chemistries  Recent Labs  Lab 08/06/19 0534  NA 136  K 3.9  CL 93*  CO2 32  GLUCOSE 145*  BUN 58*  CREATININE 1.22*  CALCIUM 8.4*   ------------------------------------------------------------------------------------------------------------------  Cardiac Enzymes No results for input(s): TROPONINI in the last 168 hours. ------------------------------------------------------------------------------------------------------------------  RADIOLOGY:  Dg Shoulder Right  Result Date: 08/05/2019 CLINICAL DATA:  Fall EXAM: RIGHT SHOULDER - 2+ VIEW COMPARISON:  None. FINDINGS: Normal alignment no fracture. Mild degenerative change in the [place] joint. IMPRESSION: Negative for fracture. Electronically Signed   By: Franchot Gallo M.D.   On: 08/05/2019 09:52   Ct Head Wo Contrast  Result Date: 08/05/2019 CLINICAL DATA:  Fall.  Head injury. EXAM: CT HEAD WITHOUT CONTRAST CT MAXILLOFACIAL WITHOUT CONTRAST CT CERVICAL SPINE WITHOUT CONTRAST TECHNIQUE: Multidetector CT imaging of the head, cervical spine, and  maxillofacial structures were performed using the standard protocol without intravenous  contrast. Multiplanar CT image reconstructions of the cervical spine and maxillofacial structures were also generated. COMPARISON:  CT head 02/26/2019 FINDINGS: CT HEAD FINDINGS Brain: Ventricle size normal. Mild white matter changes appear chronic. 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 Negative for acute infarct. Negative for intracranial hemorrhage or mass. No midline shift. Vascular: Negative for hyperdense vessel Skull: Negative for skull fracture Other: Right frontal scalp hematoma. CT MAXILLOFACIAL FINDINGS Osseous: Negative for acute fracture. Chronic nasal bone fracture unchanged. Orbits: Bilateral cataract surgery.  No orbital mass. Sinuses: Mild mucosal edema paranasal sinuses.  No air-fluid level. Soft tissues: Right frontal scalp hematoma. Right Peri orbital soft tissue swelling. CT CERVICAL SPINE FINDINGS Alignment: Mild retrolisthesis C2-3. Mild anterolisthesis C4-5. Moderate levoscoliosis. Skull base and vertebrae: Negative for fracture Soft tissues and spinal canal: No soft tissue swelling or mass. Carotid artery calcification bilaterally with the apparent moderate stenosis bilaterally Disc levels: Multilevel disc and facet degeneration. Facet degeneration most prominent on the right at C3-4 and C4-5 Upper chest: Negative Other: None IMPRESSION: 1. No acute intracranial abnormality 2. Negative for acute facial fracture. Right frontal and right maxillary soft tissue swelling compatible with hematoma/contusion 3. Negative for cervical spine fracture.  Scoliosis and spondylosis. Electronically Signed   By: Franchot Gallo M.D.   On: 08/05/2019 10:33   Ct Cervical Spine Wo Contrast  Result Date: 08/05/2019 CLINICAL DATA:  Fall.  Head injury. EXAM: CT HEAD WITHOUT CONTRAST CT MAXILLOFACIAL WITHOUT CONTRAST CT CERVICAL SPINE WITHOUT CONTRAST TECHNIQUE: Multidetector CT imaging of the head, cervical spine, and maxillofacial structures were performed using the standard protocol without  intravenous contrast. Multiplanar CT image reconstructions of the cervical spine and maxillofacial structures were also generated. COMPARISON:  CT head 02/26/2019 FINDINGS: CT HEAD FINDINGS Brain: Ventricle size normal. Mild white matter changes appear chronic. 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 Negative for acute infarct. Negative for intracranial hemorrhage or mass. No midline shift. Vascular: Negative for hyperdense vessel Skull: Negative for skull fracture Other: Right frontal scalp hematoma. CT MAXILLOFACIAL FINDINGS Osseous: Negative for acute fracture. Chronic nasal bone fracture unchanged. Orbits: Bilateral cataract surgery.  No orbital mass. Sinuses: Mild mucosal edema paranasal sinuses.  No air-fluid level. Soft tissues: Right frontal scalp hematoma. Right Peri orbital soft tissue swelling. CT CERVICAL SPINE FINDINGS Alignment: Mild retrolisthesis C2-3. Mild anterolisthesis C4-5. Moderate levoscoliosis. Skull base and vertebrae: Negative for fracture Soft tissues and spinal canal: No soft tissue swelling or mass. Carotid artery calcification bilaterally with the apparent moderate stenosis bilaterally Disc levels: Multilevel disc and facet degeneration. Facet degeneration most prominent on the right at C3-4 and C4-5 Upper chest: Negative Other: None IMPRESSION: 1. No acute intracranial abnormality 2. Negative for acute facial fracture. Right frontal and right maxillary soft tissue swelling compatible with hematoma/contusion 3. Negative for cervical spine fracture.  Scoliosis and spondylosis. Electronically Signed   By: Franchot Gallo M.D.   On: 08/05/2019 10:33   Dg Chest Portable 1 View  Result Date: 08/05/2019 CLINICAL DATA:  Shortness of breath EXAM: PORTABLE CHEST 1 VIEW COMPARISON:  December 26, 2018 FINDINGS: There is cardiomegaly with pulmonary venous hypertension. There is interstitial edema. There is patchy consolidation in the left base with left pleural effusion. There is a  pacemaker with lead tips attached to the right atrium and right ventricle. Abandoned pacemaker leads are noted on the left. Patient is status post mitral valve replacement. No adenopathy. Bones are somewhat osteoporotic. There is  aortic atherosclerosis. IMPRESSION: Cardiomegaly with pulmonary vascular congestion. There is a degree of interstitial edema and small left pleural effusion. These are findings felt to be indicative of a degree of congestive heart failure. Suspect superimposed pneumonia left base. Status post mitral valve replacement. Pacemaker leads as described. Aortic Atherosclerosis (ICD10-I70.0). Electronically Signed   By: Lowella Grip III M.D.   On: 08/05/2019 12:16   Dg Shoulder Left  Result Date: 08/05/2019 CLINICAL DATA:  Fall with left shoulder pain EXAM: LEFT SHOULDER - 2+ VIEW COMPARISON:  04/06/2017 FINDINGS: No acute fracture or dislocation. Osteopenia with mild AC joint spurring. IMPRESSION: No acute finding. Prominent osteopenia. Electronically Signed   By: Monte Fantasia M.D.   On: 08/05/2019 09:56   Dg Humerus Right  Result Date: 08/05/2019 CLINICAL DATA:  Fall. EXAM: RIGHT HUMERUS - 2+ VIEW COMPARISON:  05/27/2019. FINDINGS: Diffuse osteopenia. Degenerative changes right shoulder. No acute abnormality identified. No evidence of fracture dislocation. IMPRESSION: Diffuse osteopenia. Degenerative changes right shoulder. No acute abnormality. Electronically Signed   By: Marcello Moores  Register   On: 08/05/2019 09:54   Dg Hand Complete Left  Result Date: 08/05/2019 CLINICAL DATA:  Fall. EXAM: LEFT HAND - COMPLETE 3+ VIEW COMPARISON:  None. FINDINGS: Negative for fracture Degenerative changes in the PIP and DIP joints. Negative for erosion. IMPRESSION: Negative for fracture. Electronically Signed   By: Franchot Gallo M.D.   On: 08/05/2019 09:54   Ct Maxillofacial Wo Contrast  Result Date: 08/05/2019 CLINICAL DATA:  Fall.  Head injury. EXAM: CT HEAD WITHOUT CONTRAST CT  MAXILLOFACIAL WITHOUT CONTRAST CT CERVICAL SPINE WITHOUT CONTRAST TECHNIQUE: Multidetector CT imaging of the head, cervical spine, and maxillofacial structures were performed using the standard protocol without intravenous contrast. Multiplanar CT image reconstructions of the cervical spine and maxillofacial structures were also generated. COMPARISON:  CT head 02/26/2019 FINDINGS: CT HEAD FINDINGS Brain: Ventricle size normal. Mild white matter changes appear chronic. 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 Negative for acute infarct. Negative for intracranial hemorrhage or mass. No midline shift. Vascular: Negative for hyperdense vessel Skull: Negative for skull fracture Other: Right frontal scalp hematoma. CT MAXILLOFACIAL FINDINGS Osseous: Negative for acute fracture. Chronic nasal bone fracture unchanged. Orbits: Bilateral cataract surgery.  No orbital mass. Sinuses: Mild mucosal edema paranasal sinuses.  No air-fluid level. Soft tissues: Right frontal scalp hematoma. Right Peri orbital soft tissue swelling. CT CERVICAL SPINE FINDINGS Alignment: Mild retrolisthesis C2-3. Mild anterolisthesis C4-5. Moderate levoscoliosis. Skull base and vertebrae: Negative for fracture Soft tissues and spinal canal: No soft tissue swelling or mass. Carotid artery calcification bilaterally with the apparent moderate stenosis bilaterally Disc levels: Multilevel disc and facet degeneration. Facet degeneration most prominent on the right at C3-4 and C4-5 Upper chest: Negative Other: None IMPRESSION: 1. No acute intracranial abnormality 2. Negative for acute facial fracture. Right frontal and right maxillary soft tissue swelling compatible with hematoma/contusion 3. Negative for cervical spine fracture.  Scoliosis and spondylosis. Electronically Signed   By: Franchot Gallo M.D.   On: 08/05/2019 10:33     ASSESSMENT AND PLAN:   73 year old female patient with history of chronic diastolic heart failure, diabetes  mellitus type 2, COPD, hypertension, mitral valve replacement on Coumadin fell down from the chair.  * Acute UTI with leukocytosis.  Improving Continue IV ceftriaxone.  Follow-up cultures.  *Acute on chronic diastolic CHF.   IV lasix given for diuresis Continue oral torsemide and metolazone at home dose  *Atrial fibrillation mitral valve replacement on Coumadin.  Pharmacy consulted.  Continue rate control medications that is oral metoprolol ureenlty  *Hypertension.   Medical mgmt  *Diabetes mellitus.  Sliding scale insulin ordered.  Diabetic diet  *Ambulatory dysfunction and gait instability Mechanical fall Status post physical therapy evaluation  *DVT prophylaxis.  On Coumadin.  All the records are reviewed and case discussed with Care Management/Social Worker. Management plans discussed with the patient, family and they are in agreement.  CODE STATUS: Full code  DVT Prophylaxis: SCDs  TOTAL TIME TAKING CARE OF THIS PATIENT: 35 minutes.   POSSIBLE D/C IN 1 to 2 DAYS, DEPENDING ON CLINICAL CONDITION.  Saundra Shelling M.D on 08/06/2019 at 1:36 PM  Between 7am to 6pm - Pager - [phone]  After 6pm go to [email or web address] - password [email or web address] [facility]  Office  [phone]  CC: Primary care physician; Dion Body, MD  Note: This dictation was prepared with Dragon dictation along with smaller phrase technology. Any transcriptional errors that result from this process are unintentional.

## 2019-08-06 NOTE — Evaluation (Signed)
Physical Therapy Evaluation Patient Details Name: Betty [name] MRN: [id number] DOB: Apr 21, 1946 Today's Date: 08/06/2019   History of Present Illness  73 y.o. female with a known history of diastolic CHF, COPD, diabetes mellitus, hypertension, mitral valve replacement on Coumadin who was sleeping in a chair and rolled and fell.  Patient presented to the emergency room and was found to have leukocytosis along with UTI.  Clinical Impression  Pt did reasonably well with PT and was able to ambulation in-room distances w/o AD and w/o overt safety issues.  She reports being slow and much more hesitant than baseline and showed good effort with most tasks and exercises t/o the session.  She was able to tolerate some activity on room air, on arrival O2 in the mid 90s on 1L (uses 2L at home).  Pt should be safe to return home given today's PT exam, but will benefit from HHPT to work back to PLOF.    Follow Up Recommendations Home health PT    Equipment Recommendations  None recommended by PT    Recommendations for Other Services       Precautions / Restrictions Precautions Precautions: Fall Restrictions Weight Bearing Restrictions: No      Mobility  Bed Mobility Overal bed mobility: Modified Independent             General bed mobility comments: Pt able to get up to EOB w/o assist, good safety and confidence  Transfers Overall transfer level: Modified independent Equipment used: Rolling walker (2 wheeled)             General transfer comment: Pt was able to rise to standing w/o assist, did not require cuing  Ambulation/Gait Ambulation/Gait assistance: Supervision Gait Distance (Feet): 50 Feet Assistive device: None       General Gait Details: Pt was able to ambulate with relative ease in room w/o AD, able to turn and maintain balance w/o issue, O2 remained low 90s after brief bout w/o O2 (on 2L at home, is only on 1L here...)  Stairs            Wheelchair  Mobility    Modified Rankin (Stroke Patients Only)       Balance Overall balance assessment: Modified Independent                                           Pertinent Vitals/Pain Pain Assessment: 0-10 Pain Score: 4  Pain Location: head ache, shoulders, neck    Home Living Family/patient expects to be discharged to:: Private residence   Available Help at Discharge: Family;Available PRN/intermittently Type of Home: House Home Access: Stairs to enter Entrance Stairs-Rails: Can reach both Entrance Stairs-Number of Steps: 2 Home Layout: One level        Prior Function Level of Independence: Independent         Comments: Indep with ADLs, household and community mobilization without assist device; home O2 at 2L; denies recent fall history (within 6 months)     Hand Dominance        Extremity/Trunk Assessment   Upper Extremity Assessment Upper Extremity Assessment: Generalized weakness(able to elevated shoulders ~120 b/l, no increased pain)    Lower Extremity Assessment Lower Extremity Assessment: Overall WFL for tasks assessed       Communication   Communication: No difficulties  Cognition Arousal/Alertness: Awake/alert Behavior During Therapy: WFL for tasks assessed/performed Overall Cognitive  Status: Within Functional Limits for tasks assessed                                        General Comments      Exercises Total Joint Exercises Ankle Circles/Pumps: AROM;10 reps Heel Slides: AROM;10 reps Hip ABduction/ADduction: AROM;10 reps Straight Leg Raises: AROM;10 reps   Assessment/Plan    PT Assessment Patient needs continued PT services  PT Problem List Decreased strength;Decreased range of motion;Decreased activity tolerance;Decreased mobility;Decreased balance;Decreased safety awareness;Pain       PT Treatment Interventions DME instruction;Gait training;Stair training;Functional mobility training;Therapeutic  activities;Therapeutic exercise;Balance training;Neuromuscular re-education;Patient/family education    PT Goals (Current goals can be found in the Care Plan section)  Acute Rehab PT Goals Patient Stated Goal: Go home PT Goal Formulation: With patient Time For Goal Achievement: 08/20/19 Potential to Achieve Goals: Good    Frequency Min 2X/week   Barriers to discharge        Co-evaluation               AM-PAC PT "6 Clicks" Mobility  Outcome Measure Help needed turning from your back to your side while in a flat bed without using bedrails?: None Help needed moving from lying on your back to sitting on the side of a flat bed without using bedrails?: None Help needed moving to and from a bed to a chair (including a wheelchair)?: None Help needed standing up from a chair using your arms (e.g., wheelchair or bedside chair)?: A Little Help needed to walk in hospital room?: A Little Help needed climbing 3-5 steps with a railing? : A Little 6 Click Score: 21    End of Session Equipment Utilized During Treatment: Gait belt Activity Tolerance: Patient tolerated treatment well Patient left: with chair alarm set;with call bell/phone within reach Nurse Communication: Mobility status PT Visit Diagnosis: Muscle weakness (generalized) (M62.81);Difficulty in walking, not elsewhere classified (R26.2);Pain Pain - Right/Left: Right Pain - part of body: Shoulder    Time: 0277-4128 PT Time Calculation (min) (ACUTE ONLY): 33 min   Charges:   PT Evaluation $PT Eval Low Complexity: 1 Low PT Treatments $Therapeutic Exercise: 8-22 mins        Kreg Shropshire, DPT 08/06/2019, 12:03 PM

## 2019-08-07 LAB — PROTIME-INR
INR: 2.4 — ABNORMAL HIGH (ref 0.8–1.2)
Prothrombin Time: 25.5 seconds — ABNORMAL HIGH (ref 11.4–15.2)

## 2019-08-07 LAB — GLUCOSE, CAPILLARY
Glucose-Capillary: 133 mg/dL — ABNORMAL HIGH (ref 70–99)
Glucose-Capillary: 277 mg/dL — ABNORMAL HIGH (ref 70–99)

## 2019-08-07 MED ORDER — PNEUMOCOCCAL 13-VAL CONJ VACC IM SUSP: 0.5000 mL | INTRAMUSCULAR | Status: DC

## 2019-08-07 MED ORDER — CEPHALEXIN 250 MG PO CAPS: 250.0000 mg | ORAL_CAPSULE | Freq: Three times a day (TID) | ORAL | 0 refills | Status: AC

## 2019-08-07 MED ORDER — OXYCODONE HCL 5 MG PO TABS: 5.0000 mg | ORAL_TABLET | Freq: Four times a day (QID) | ORAL | 0 refills | Status: DC | PRN

## 2019-08-07 MED ORDER — CEPHALEXIN 250 MG PO CAPS
250.0000 mg | ORAL_CAPSULE | Freq: Three times a day (TID) | ORAL | Status: DC
  Administered 2019-08-07 (×2): 250 mg via ORAL
  Filled 2019-08-07 (×3): qty 1

## 2019-08-07 MED ORDER — PNEUMOCOCCAL VAC POLYVALENT 25 MCG/0.5ML IJ INJ: 0.5000 mL | INJECTION | INTRAMUSCULAR | Status: DC

## 2019-08-07 MED ORDER — PNEUMOCOCCAL VAC POLYVALENT 25 MCG/0.5ML IJ INJ
0.5000 mL | INJECTION | INTRAMUSCULAR | Status: AC
  Administered 2019-08-07: 11:00:00 0.5 mL via INTRAMUSCULAR
  Filled 2019-08-07: qty 0.5

## 2019-08-07 NOTE — TOC Initial Note (Signed)
Transition of Care [facility]) - Initial/Assessment Note    Patient Details  Name: Betty [name] MRN: [id number] Date of Birth: 1946/07/02  Transition of Care [facility]) CM/SW Contact:    Shelbie Hutching, RN Phone Number: 08/07/2019, 12:20 PM  Clinical Narrative:                 Patient admitted after falling out of her recliner at home and hitting her face, found to have a UTI. Patient has extensive bruising to both sides of her face.  Patient is very pleasant, sitting up in the bedside chair.  Patient lives at home alone, her son will pick her up after he gets off work at 4 pm.  Patient is independent at home, she does have a walker but rarely needs to use it.  Patient reports that she is open with [facility], new home health orders placed and [facility] notified of patient discharge.   Patient given information on Med alert personal emergency response system.  RNCM recommended that she also contact [employer] because they have a contract with [employer] and she may be able to get a personal emergency response system as part of her health insurance plan for free.   No other discharge needs identified.    Expected Discharge Plan: [place] Barriers to Discharge: Barriers Resolved   Patient Goals and CMS Choice   CMS Medicare.gov Compare Post Acute Care list provided to:: Patient Choice offered to / list presented to : Patient  Expected Discharge Plan and Services Expected Discharge Plan: [place]   Discharge Planning Services: CM Consult Post Acute Care Choice: [place] arrangements for the past 2 months: Single Family Home Expected Discharge Date: 08/07/19                         HH Arranged: RN, PT, OT [place] Agency: [facility] Date [place]: 08/07/19 Time [place]: 1219 Representative spoke with at [place]: Joelene Millin - voice message left with discharge information- already open with  [facility]  Prior Living Arrangements/Services Living arrangements for the past 2 months: [place] with:: Self Patient language and need for interpreter reviewed:: No Do you feel safe going back to the place where you live?: Yes      Need for Family Participation in Patient Care: Yes (Comment) Care giver support system in place?: Yes (comment)(son) Current home services: Home RN Criminal Activity/Legal Involvement Pertinent to Current Situation/Hospitalization: No - Comment as needed  Activities of Daily Living Home Assistive Devices/Equipment: None ADL Screening (condition at time of admission) Patient's cognitive ability adequate to safely complete daily activities?: Yes Is the patient deaf or have difficulty hearing?: No Does the patient have difficulty seeing, even when wearing glasses/contacts?: Yes Does the patient have difficulty concentrating, remembering, or making decisions?: No Patient able to express need for assistance with ADLs?: Yes Does the patient have difficulty dressing or bathing?: No Independently performs ADLs?: Yes (appropriate for developmental age) Does the patient have difficulty walking or climbing stairs?: Yes Weakness of Legs: None Weakness of Arms/Hands: None  Permission Sought/Granted Permission sought to share information with : Case Manager, Other (comment) Permission granted to share information with : Yes, Verbal Permission Granted     Permission granted to share info w AGENCY: [facility]        Emotional Assessment Appearance:: Appears stated age Attitude/Demeanor/Rapport: Engaged Affect (typically observed): Accepting  Orientation: : Oriented to Self, Oriented to Place, Oriented to  Time, Oriented to Situation Alcohol / Substance Use: Not Applicable Psych Involvement: No (comment)  Admission diagnosis:  Acute cystitis without hematuria [N30.00] Community acquired pneumonia, unspecified laterality [J18.9] Patient  Active Problem List   Diagnosis Date Noted  . Gait instability 08/06/2019  . Severe sepsis ([place]) 12/25/2018  . Abdominal pain   . Goals of care, counseling/discussion   . Palliative care by specialist   . Respiratory failure, acute ([place]) 02/26/2018  . COPD (chronic obstructive pulmonary disease) ([place]) 02/26/2018  . C2 cervical fracture ([place]) 02/19/2018  . Hematuria 01/27/2018  . Carotid stenosis 12/23/2017  . Varicose veins of both lower extremities with pain 11/17/2017  . Acute on chronic diastolic CHF (congestive heart failure) ([place]) 08/13/2017  . Leg pain 07/14/2017  . Chronic venous insufficiency 07/14/2017  . PAD (peripheral artery disease) ([place]) 07/14/2017  . Postprocedural hemorrhage due to complication of oral surgery 03/27/2017  . Acute blood loss anemia 03/27/2017  . H/O mitral valve replacement with mechanical valve 03/27/2017  . Atypical atrial flutter ([place]) 01/23/2017  . Long term current use of anticoagulants with INR goal of 2.5-3.5 09/18/2016  . Syncope 06/25/2016  . UTI (urinary tract infection) 06/25/2016  . Afib ([place]) 05/10/2016  . Dyspnea 05/10/2016  . Chronic diastolic CHF (congestive heart failure) ([place]) 05/10/2016  . Anemia 05/10/2016  . Anemia 05/10/2016  . Other specified abnormal immunological findings in serum 04/24/2016  . Sepsis ([place]) 04/01/2016  . Prolonged Q-T interval on ECG 03/13/2016  . Mitral stenosis 03/13/2016  . Interstitial lung disease ([place]) 12/29/2015  . Chronic LBP 07/26/2015  . Essential (primary) hypertension 07/26/2015  . Idiopathic insomnia 07/26/2015  . Restless leg 07/26/2015  . Obesity (BMI 30-39.9) 01/07/2015  . Abnormal EKG 09/11/2014  . External nasal lesion 02/26/2014  . Abnormal liver function tests 01/28/2014  . Ankle pain, right 01/28/2014  . Arthralgia of ankle or foot 01/28/2014  . Urinary frequency 12/24/2013  . Hemorrhoids 12/24/2013  . Neck pain on right side 11/10/2013  . Vitamin D deficiency 09/09/2013  .  Urinary incontinence 05/30/2013  . History of colonic polyps 05/30/2013  . Controlled type 2 diabetes mellitus without complication ([place]) 16/09/9603  . H/O deep venous thrombosis 05/05/2013  . History of pulmonary embolism 05/05/2013  . 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 OSA (obstructive sleep apnea) 05/05/2013  . Depression 04/13/2009  . THYROID NODULE 04/13/2009  . 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 GERD 04/13/2009  . OSTEOARTHRITIS 04/13/2009  . BACK PAIN 04/13/2009  . HEADACHE 04/13/2009  . Gastro-esophageal reflux disease without esophagitis 04/13/2009  . Mild episode of recurrent major depressive disorder ([place]) 04/13/2009  . 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 PAF (paroxysmal atrial fibrillation) ([place]) 03/28/2009  . VENTRICULAR HYPERTROPHY, LEFT 03/28/2009  . Hypertensive cardiomyopathy, with heart failure ([place]) 03/28/2009   PCP:  Dion Body, MD Pharmacy:   [place], [place] - [address] [address] Phone: [phone] Fax: [phone]     Social Determinants of Health (SDOH) Interventions    Readmission Risk Interventions No flowsheet data found.

## 2019-08-07 NOTE — Progress Notes (Signed)
[facility] for warfarin Indication: atrial fibrillation  Allergies  Allergen Reactions  . Flecainide Shortness Of Breath and Other (See Comments)    Reaction: dizziness   . Amiodarone Other (See Comments)    Pt states that this medication causes lung bleeding.      Patient Measurements: Height: 5\' 9"  (175.3 cm) Weight: 220 lb (99.8 kg) IBW/kg (Calculated) : 66.2  Vital Signs: Temp: 98.6 F (37 C) (08/15 0740) BP: 103/70 (08/15 0740) Pulse Rate: 72 (08/15 0740)  Labs: Recent Labs    08/05/19 0915 08/06/19 0534 08/07/19 0537  HGB 12.9 11.6*  --   HCT 39.5 36.2  --   PLT 250 218  --   APTT 41*  --   --   LABPROT 32.1* 31.1* 25.5*  INR 3.2* 3.1* 2.4*  CREATININE 1.50* 1.22*  --     Estimated Creatinine Clearance: 52.4 mL/min (A) (by C-G formula based on SCr of 1.22 mg/dL (H)).   Medical History: Past Medical History:  Diagnosis Date  . Acute diastolic heart failure ([place])   . Allergy   . 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 Asthma   . Atrial fibrillation ([place])   . Backache, unspecified   . Cancer ([place])    skin  . Cardiomegaly   . COPD (chronic obstructive pulmonary disease) ([place])   . Diabetes mellitus without complication ([place])   . 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 Esophageal reflux   . Headache(784.0)   . Herpes zoster without mention of complication   . Hx: UTI (urinary tract infection)   . Hypertension    heart controlled w CHF  . Nontoxic uninodular goiter   . Obesity, unspecified   . Osteoarthrosis, unspecified whether generalized or localized, unspecified site   . Unspecified sleep apnea   . Urine incontinence    hx of    Assessment: 74 year old female with h/o afib on warfarin PTA with mechanical valve replacement. Home dose 7 mg all days except 8 mg on Tuesday and Thursday. Patient here for UTI and fall. Imaging negative for bleeding. Pharmacy consulted for warfarin  dosing.  Date INR Dose 8/14 3.1  7mg  8/15 2.4 7mg    Goal of Therapy:  INR Goal: 2.5-3.5 Monitor platelets by anticoagulation protocol: Yes   Plan:  Continue home dose of warfarin 7mg . Will obtain INR with am labs.   Pharmacy will continue to monitor and adjust per consult.   Betty [name] 08/07/2019 11:14 AM

## 2019-08-07 NOTE — Progress Notes (Signed)
Pt reports she is sore and stiff; desires to go home. Up in chair and tolerated well. Oxycodone given for pain with adequate control reported. Oral and written AVS instructions given with stated understanding; 2 rx's given. Awaiting family to transport her home.

## 2019-08-07 NOTE — Discharge Summary (Signed)
[name] NAME: Betty [name]    MR#:  [id number]  DATE OF BIRTH:  15-Aug-1946  DATE OF ADMISSION:  08/05/2019 ADMITTING PHYSICIAN: Betty Bow, MD  DATE OF DISCHARGE: 08/07/2019  PRIMARY CARE PHYSICIAN: Betty Body, MD    ADMISSION DIAGNOSIS:  Acute cystitis without hematuria [N30.00] Community acquired pneumonia, unspecified laterality [J18.9]  DISCHARGE DIAGNOSIS:  Active Problems:   UTI (urinary tract infection)   Gait instability   SECONDARY DIAGNOSIS:   Past Medical History:  Diagnosis Date  . Acute diastolic heart failure ([place])   . Allergy   . 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 Asthma   . Atrial fibrillation ([place])   . Backache, unspecified   . Cancer ([place])    skin  . Cardiomegaly   . COPD (chronic obstructive pulmonary disease) ([place])   . Diabetes mellitus without complication ([place])   . 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 Esophageal reflux   . Headache(784.0)   . Herpes zoster without mention of complication   . Hx: UTI (urinary tract infection)   . Hypertension    heart controlled w CHF  . Nontoxic uninodular goiter   . Obesity, unspecified   . Osteoarthrosis, unspecified whether generalized or localized, unspecified site   . Unspecified sleep apnea   . Urine incontinence    hx of    HOSPITAL COURSE:  73 year old female with chronic diastolic heart failure and diabetes who presented to the emergency room after mechanical fall.  1.  Gram-negative rod UTI: Patient without fevers and is asymptomatic on IV ceftriaxone.  She will be discharged on oral Keflex.  We asked that PCP follow-up with final urine culture.  2.  Mild acute on chronic diastolic heart failure: Patient was given one-time dose of IV Lasix.  She will continue on PRN metolazone, Aldactone , metoprolol and torsemide.  She is euvolemic.  3.  PAF with mitral valve replacement currently on Coumadin INR  is 2.4 at discharge. Continue metoprolol for heart rate control 4.  Diabetes: Continue ADA diet with metformin and glipizide 5.  Hyperlipidemia: Continue statin 6.  Iron deficiency anemia on ferrous sulfate 7.  Depression: Continue Effexor  DISCHARGE CONDITIONS AND DIET:   Stable for discharge heart healthy diabetic diet  CONSULTS OBTAINED:    DRUG ALLERGIES:   Allergies  Allergen Reactions  . Flecainide Shortness Of Breath and Other (See Comments)    Reaction: dizziness   . Amiodarone Other (See Comments)    Pt states that this medication causes lung bleeding.      DISCHARGE MEDICATIONS:   Allergies as of 08/07/2019      Reactions   Flecainide Shortness Of Breath, Other (See Comments)   Reaction: dizziness    Amiodarone Other (See Comments)   Pt states that this medication causes lung bleeding.        Medication List    STOP taking these medications   acetaminophen 325 MG tablet Commonly known as: TYLENOL   cefdinir 300 MG capsule Commonly known as: OMNICEF   guaiFENesin-dextromethorphan 100-10 MG/5ML syrup Commonly known as: ROBITUSSIN DM     TAKE these medications   atorvastatin 20 MG tablet Commonly known as: LIPITOR Take 20 mg by mouth at bedtime. Notes to patient: Due this evening   azaTHIOprine 50 MG tablet Commonly known as: IMURAN Take 1 tablet by mouth 2 (two) times daily. Notes to patient: Next dose due tonight   cephALEXin  250 MG capsule Commonly known as: KEFLEX Take 1 capsule (250 mg total) by mouth every 8 (eight) hours for 5 days. Notes to patient: Started at 1000 this morning-please make sure you take afternoon dose and bedtime until all gone.    ferrous sulfate 325 (65 FE) MG EC tablet Take 325 mg by mouth daily. Notes to patient: Sunday morning   gabapentin 300 MG capsule Commonly known as: NEURONTIN Take 600 mg by mouth 3 (three) times daily. Take 2 capsules (600mg ) in morning, 1 capsule (300mg ) at midday, and 2 capsules (600mg )  at bedtime Notes to patient: Given this morning and noon dose. Start tonight   glipiZIDE 10 MG 24 hr tablet Commonly known as: GLUCOTROL XL Take 1 tablet by mouth daily. Notes to patient: Sunday morning   levocetirizine 5 MG tablet Commonly known as: XYZAL Take 5 mg by mouth at bedtime. Notes to patient: Take tonight   metFORMIN 500 MG tablet Commonly known as: GLUCOPHAGE Take 1 tablet by mouth 2 (two) times daily. Notes to patient: Supper   metolazone 2.5 MG tablet Commonly known as: ZAROXOLYN Take 2.5 mg by mouth as needed (fluid retention). Take once weekly as needed   metoprolol succinate 50 MG 24 hr tablet Commonly known as: TOPROL-XL Take 50 mg by mouth daily. Notes to patient: Sunday morning   oxyCODONE 5 MG immediate release tablet Commonly known as: Oxy IR/ROXICODONE Take 1 tablet (5 mg total) by mouth every 6 (six) hours as needed for severe pain. Notes to patient: Last dose given at 0746.   pantoprazole 40 MG tablet Commonly known as: PROTONIX Take 40 mg by mouth daily. Notes to patient: Sunday morning   potassium chloride 10 MEQ CR capsule Commonly known as: MICRO-K Take 20 mEq by mouth 2 (two) times daily. Notes to patient: Take this evening   rOPINIRole 2 MG tablet Commonly known as: REQUIP Take 0.5 tablets (1 mg total) by mouth at bedtime. Notes to patient: tonight   spironolactone 50 MG tablet Commonly known as: ALDACTONE Take 75 mg by mouth daily. Notes to patient: Sunday morning   tiZANidine 2 MG tablet Commonly known as: ZANAFLEX Take 2 mg by mouth daily as needed.   torsemide 20 MG tablet Commonly known as: DEMADEX Take 100 mg by mouth 2 (two) times daily. Notes to patient: Next dose due this evening   traZODone 50 MG tablet Commonly known as: DESYREL Take 50 mg by mouth at bedtime. Notes to patient: Tonight   venlafaxine XR 150 MG 24 hr capsule Commonly known as: EFFEXOR-XR Take 150 mg by mouth daily. Notes to patient: Sunday  morning   Vitamin D (Ergocalciferol) 1.25 MG (50000 UT) Caps capsule Commonly known as: DRISDOL Take 50,000 Units by mouth every Tuesday. Notes to patient: Tuesday   warfarin 1 MG tablet Commonly known as: COUMADIN Take 8 mg by mouth 2 (two) times a week. Take 8mg  Tuesday and Thursday Notes to patient: As directed- on Tuesday   warfarin 5 MG tablet Commonly known as: COUMADIN Take 7 mg by mouth daily at 8 pm. Take 7MG  on Monday,Wednesday,Friday,Saturday, and Sunday Notes to patient: Take today at 6:00 p.m.         Today   CHIEF COMPLAINT:  Patient with shoulder pain.  X-rays on admission showed severe osteopenia and no acute fracture.   VITAL SIGNS:  Blood pressure 103/70, pulse 72, temperature 98.6 F (37 C), resp. rate 20, height 5\' 9"  (1.753 m), weight 99.8 kg, SpO2 95 %.  REVIEW OF SYSTEMS:  Review of Systems  Constitutional: Negative.  Negative for chills, fever and malaise/fatigue.  HENT: Negative.  Negative for ear discharge, ear pain, hearing loss, nosebleeds and sore throat.   Eyes: Negative.  Negative for blurred vision and pain.  Respiratory: Negative.  Negative for cough, hemoptysis, shortness of breath and wheezing.   Cardiovascular: Negative.  Negative for chest pain, palpitations and leg swelling.  Gastrointestinal: Negative.  Negative for abdominal pain, blood in stool, diarrhea, nausea and vomiting.  Genitourinary: Negative.  Negative for dysuria.  Musculoskeletal: Positive for falls and joint pain. Negative for back pain.  Skin: Negative.   Neurological: Negative for dizziness, tremors, speech change, focal weakness, seizures and headaches.  Endo/Heme/Allergies: Negative.  Does not bruise/bleed easily.  Psychiatric/Behavioral: Negative.  Negative for depression, hallucinations and suicidal ideas.     PHYSICAL EXAMINATION:  GENERAL:  73 y.o.-year-old patient lying in the bed with no acute distress.  NECK:  Supple, no jugular venous distention. No  thyroid enlargement, no tenderness.  LUNGS: Normal breath sounds bilaterally, no wheezing, rales,rhonchi  No use of accessory muscles of respiration.  CARDIOVASCULAR: S1, S2 normal. No murmurs, rubs, or gallops.  ABDOMEN: Soft, non-tender, non-distended. Bowel sounds present. No organomegaly or mass.  EXTREMITIES: No pedal edema, cyanosis, or clubbing.  Right shoulder due to pain with decreased range of motion PSYCHIATRIC: The patient is alert and oriented x 3.  SKIN: Ecchymosis noted all over face  DATA REVIEW:   CBC Recent Labs  Lab 08/06/19 0534  WBC 10.0  HGB 11.6*  HCT 36.2  PLT 218    Chemistries  Recent Labs  Lab 08/06/19 0534  NA 136  K 3.9  CL 93*  CO2 32  GLUCOSE 145*  BUN 58*  CREATININE 1.22*  CALCIUM 8.4*    Cardiac Enzymes No results for input(s): TROPONINI in the last 168 hours.  Microbiology Results  @MICRORSLT48 @  RADIOLOGY:  Dg Chest Portable 1 View  Result Date: 08/05/2019 CLINICAL DATA:  Shortness of breath EXAM: PORTABLE CHEST 1 VIEW COMPARISON:  December 26, 2018 FINDINGS: There is cardiomegaly with pulmonary venous hypertension. There is interstitial edema. There is patchy consolidation in the left base with left pleural effusion. There is a pacemaker with lead tips attached to the right atrium and right ventricle. Abandoned pacemaker leads are noted on the left. Patient is status post mitral valve replacement. No adenopathy. Bones are somewhat osteoporotic. There is aortic atherosclerosis. IMPRESSION: Cardiomegaly with pulmonary vascular congestion. There is a degree of interstitial edema and small left pleural effusion. These are findings felt to be indicative of a degree of congestive heart failure. Suspect superimposed pneumonia left base. Status post mitral valve replacement. Pacemaker leads as described. Aortic Atherosclerosis (ICD10-I70.0). Electronically Signed   By: Lowella Grip III M.D.   On: 08/05/2019 12:16      Allergies as of  08/07/2019      Reactions   Flecainide Shortness Of Breath, Other (See Comments)   Reaction: dizziness    Amiodarone Other (See Comments)   Pt states that this medication causes lung bleeding.        Medication List    STOP taking these medications   acetaminophen 325 MG tablet Commonly known as: TYLENOL   cefdinir 300 MG capsule Commonly known as: OMNICEF   guaiFENesin-dextromethorphan 100-10 MG/5ML syrup Commonly known as: ROBITUSSIN DM     TAKE these medications   atorvastatin 20 MG tablet Commonly known as: LIPITOR Take 20 mg by mouth at bedtime.  Notes to patient: Due this evening   azaTHIOprine 50 MG tablet Commonly known as: IMURAN Take 1 tablet by mouth 2 (two) times daily. Notes to patient: Next dose due tonight   cephALEXin 250 MG capsule Commonly known as: KEFLEX Take 1 capsule (250 mg total) by mouth every 8 (eight) hours for 5 days. Notes to patient: Started at 1000 this morning-please make sure you take afternoon dose and bedtime until all gone.    ferrous sulfate 325 (65 FE) MG EC tablet Take 325 mg by mouth daily. Notes to patient: Sunday morning   gabapentin 300 MG capsule Commonly known as: NEURONTIN Take 600 mg by mouth 3 (three) times daily. Take 2 capsules (600mg ) in morning, 1 capsule (300mg ) at midday, and 2 capsules (600mg ) at bedtime Notes to patient: Given this morning and noon dose. Start tonight   glipiZIDE 10 MG 24 hr tablet Commonly known as: GLUCOTROL XL Take 1 tablet by mouth daily. Notes to patient: Sunday morning   levocetirizine 5 MG tablet Commonly known as: XYZAL Take 5 mg by mouth at bedtime. Notes to patient: Take tonight   metFORMIN 500 MG tablet Commonly known as: GLUCOPHAGE Take 1 tablet by mouth 2 (two) times daily. Notes to patient: Supper   metolazone 2.5 MG tablet Commonly known as: ZAROXOLYN Take 2.5 mg by mouth as needed (fluid retention). Take once weekly as needed   metoprolol succinate 50 MG 24 hr  tablet Commonly known as: TOPROL-XL Take 50 mg by mouth daily. Notes to patient: Sunday morning   oxyCODONE 5 MG immediate release tablet Commonly known as: Oxy IR/ROXICODONE Take 1 tablet (5 mg total) by mouth every 6 (six) hours as needed for severe pain. Notes to patient: Last dose given at 0746.   pantoprazole 40 MG tablet Commonly known as: PROTONIX Take 40 mg by mouth daily. Notes to patient: Sunday morning   potassium chloride 10 MEQ CR capsule Commonly known as: MICRO-K Take 20 mEq by mouth 2 (two) times daily. Notes to patient: Take this evening   rOPINIRole 2 MG tablet Commonly known as: REQUIP Take 0.5 tablets (1 mg total) by mouth at bedtime. Notes to patient: tonight   spironolactone 50 MG tablet Commonly known as: ALDACTONE Take 75 mg by mouth daily. Notes to patient: Sunday morning   tiZANidine 2 MG tablet Commonly known as: ZANAFLEX Take 2 mg by mouth daily as needed.   torsemide 20 MG tablet Commonly known as: DEMADEX Take 100 mg by mouth 2 (two) times daily. Notes to patient: Next dose due this evening   traZODone 50 MG tablet Commonly known as: DESYREL Take 50 mg by mouth at bedtime. Notes to patient: Tonight   venlafaxine XR 150 MG 24 hr capsule Commonly known as: EFFEXOR-XR Take 150 mg by mouth daily. Notes to patient: Sunday morning   Vitamin D (Ergocalciferol) 1.25 MG (50000 UT) Caps capsule Commonly known as: DRISDOL Take 50,000 Units by mouth every Tuesday. Notes to patient: Tuesday   warfarin 1 MG tablet Commonly known as: COUMADIN Take 8 mg by mouth 2 (two) times a week. Take 8mg  Tuesday and Thursday Notes to patient: As directed- on Tuesday   warfarin 5 MG tablet Commonly known as: COUMADIN Take 7 mg by mouth daily at 8 pm. Take 7MG  on Monday,Wednesday,Friday,Saturday, and Sunday Notes to patient: Take today at 6:00 p.m.          Management plans discussed with the patient and sje is in agreement. Stable for discharge  home  with [facility]  Patient should follow up with pcp  CODE STATUS:     Code Status Orders  (From admission, onward)         Start     Ordered   08/05/19 1632  Full code  Continuous     08/05/19 1633        Code Status History    Date Active Date Inactive Code Status Order ID Comments User Context   12/25/2018 2107 12/29/2018 2049 Full Code [id number]  Betty Bow, MD ED   10/27/2018 0152 11/03/2018 1933 Full Code [id number]  Lance Coon, MD Inpatient   02/19/2018 1034 03/01/2018 1852 Full Code [id number]  Bettey Costa, MD Inpatient   01/27/2018 0603 02/04/2018 1634 Full Code [id number]  Saundra Shelling, MD ED   07/20/2017 0437 07/20/2017 1940 Full Code [id number]  Harrie Foreman, MD Inpatient   05/25/2017 1951 05/27/2017 1734 Full Code [id number]  Gladstone Lighter, MD Inpatient   03/27/2017 2251 04/09/2017 1549 Full Code [id number]  Lance Coon, MD Inpatient   06/26/2016 0106 06/28/2016 1639 Full Code [id number]  Lance Coon, MD Inpatient   05/10/2016 1638 05/12/2016 1659 Full Code [id number]  Theodoro Grist, MD ED   04/01/2016 1713 04/03/2016 0558 Full Code [id number]  Henreitta Leber, MD Inpatient   Advance Care Planning Activity    Advance Directive Documentation     Most Recent Value  Type of Advance Directive  Living will, Healthcare Power of Attorney  Pre-existing out of facility DNR order (yellow form or pink MOST form)  -  "MOST" Form in Place?  -      TOTAL TIME TAKING CARE OF THIS PATIENT: 387 minutes.    Note: This dictation was prepared with Dragon dictation along with smaller phrase technology. Any transcriptional errors that result from this process are unintentional.  Bettey Costa M.D on 08/07/2019 at 11:48 AM  Between 7am to 6pm - Pager - [phone] After 6pm go to [email or web address] - password [email or web address] [facility]  Office  [phone]  CC: Primary care physician; Betty Body, MD

## 2019-08-07 NOTE — Plan of Care (Signed)

## 2019-08-07 NOTE — Progress Notes (Signed)
Family here to transport pt home. Reviewed rx need and next dose of antibiotic due. Family confirmed pt has limited memory recall. Discharge packet sent with pt/family. Pt transported in transport chair to private vehicle for discharge home to self/family care.

## 2019-08-08 LAB — URINE CULTURE: Culture: >=100000 — AB

## 2019-08-11 NOTE — Progress Notes (Deleted)
   Patient ID: Betty [name], female    DOB: September 06, 1946, 73 y.o.   MRN: [id number]  HPI  Betty [name] is a 73 y/o female with a history of   Echo report from 12/26/2018 reviewed and showed an EF of 60-65% along with severe TR and an elevated PA pressure of 135-140 mmHg.   Admitted 08/05/2019 due to acute on chronic HF along with UTI. Given antibiotics. Patient initially needed IV lasix and then transitioned to oral diuretics. Discharged after 2 days.   She presents today for her initial visit with a chief complaint of  Review of Systems    Physical Exam    Assessment & Plan:  1: Chronic heart failure with preserved ejection fraction- - NYHA class - saw cardiology Betty [name]) 08/03/2019 - BNP 02/26/18 was   2: HTN- - BP - saw PCP (Betty [name]) 06/23/2019 - BMP 08/06/2019 reviewed and showed sodium 136, potassium 3.9, creatinine 1.22 and GFR 44  3: DM- - A1c on 08/05/2019 was 7.1%

## 2019-08-12 ENCOUNTER — Telehealth: Payer: Self-pay | Admitting: Family

## 2019-08-12 ENCOUNTER — Ambulatory Visit: Payer: Medicare Other | Admitting: Family

## 2019-08-12 NOTE — Telephone Encounter (Signed)
Patient did not show for her [facility] appointment on 08/12/2019. Will attempt to reschedule.

## 2019-09-08 DIAGNOSIS — I5032 Chronic diastolic (congestive) heart failure: Secondary | ICD-10-CM
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
## 2019-10-12 ENCOUNTER — Ambulatory Visit (INDEPENDENT_AMBULATORY_CARE_PROVIDER_SITE_OTHER): Payer: Medicare Other | Admitting: Urology

## 2019-10-12 ENCOUNTER — Other Ambulatory Visit: Payer: Self-pay

## 2019-10-12 ENCOUNTER — Encounter: Payer: Self-pay | Admitting: Urology

## 2019-10-12 VITALS — BP 148/70 | HR 79 | Ht 69.0 in | Wt 225.0 lb

## 2019-10-12 DIAGNOSIS — R300 Dysuria: Secondary | ICD-10-CM

## 2019-10-12 DIAGNOSIS — N3946 Mixed incontinence: Secondary | ICD-10-CM

## 2019-10-12 DIAGNOSIS — N390 Urinary tract infection, site not specified: Secondary | ICD-10-CM

## 2019-10-12 NOTE — Progress Notes (Signed)
10/12/2019 9:25 PM   Betty [name] 08/02/1946 [id number]  Referring provider: Dion Body, MD [facility] [place],  [place] [address]  Chief Complaint  Patient presents with  . Urinary Incontinence    HPI: 73 year old female followed by Dr. Matilde Sprang for OAB who was referred back for further evaluation of recurrent urinary tract infections.  She has been treated for 2 urinary tract infections as an outpatient over the past 6 months including a Klebsiella UTI in 06/02/2019 as well as an E. coli urinary tract infection on 07/15/2019.  On 05/2019, she was admitted for CHF exacerbation at [facility].  Appears that her urine was checked as part of her evaluation.  I see no documentation that she was symptomatic.  Her urine was checked again on 07/15/2019 at a cardiology follow-up for congestive heart failure.  Again, she was asymptomatic at the time.  This was collected due to her history of "recurrent UTI"  She also was admitted in 07/2019 with a gram-negative urinary tract infection.  Notably, her presentation on admission was a mechanical fall.  She was discharged home on Keflex however urine culture grew ESBL.  Currently, other than her baseline, she denies any significant urinary symptoms including no dysuria.  She has no fevers or chills, flank pain, or any other systemic signs of infection.  Her symptoms are relatively mild.  No gross hematuria.  She does continue to have severe baseline urinary incontinence, both urge and stress.  She wears a diaper.  PMH: Past Medical History:  Diagnosis Date  . Acute diastolic heart failure ([place])   . Allergy   . 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 Asthma   . Atrial fibrillation ([place])   . Backache, unspecified   . Cancer ([place])    skin  . Cardiomegaly   . COPD (chronic obstructive pulmonary disease) ([place])   . Diabetes mellitus without complication ([place])   . 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 Esophageal reflux   . Headache(784.0)   . Herpes zoster without mention of complication   . Hx: UTI (urinary tract infection)   . Hypertension    heart controlled w CHF  . Nontoxic uninodular goiter   . Obesity, unspecified   . Osteoarthrosis, unspecified whether generalized or localized, unspecified site   . Unspecified sleep apnea   . Urine incontinence    hx of    Surgical History: Past Surgical History:  Procedure Laterality Date  . ABDOMINAL HYSTERECTOMY  1979   complete (for precancerous cells)  . ABLATION  2011 & 2014  . bladder botox  01/26/2018  . CHOLECYSTECTOMY    . CYSTOSCOPY WITH FULGERATION N/A 01/28/2018   Procedure: [place] AND CLOT EVACUATION;  Surgeon: Hollice Espy, MD;  Location: [facility] ORS;  Service: Urology;  Laterality: N/A;  . Interstim Placement  2012  . OOPHORECTOMY      Home Medications:  Allergies as of 10/12/2019      Reactions   Flecainide Shortness Of Breath, Other (See Comments)   Reaction: dizziness    Amiodarone Other (See Comments)   Pt states that this medication causes lung bleeding.        Medication List       Accurate as of October 12, 2019 11:59 PM. If you have any questions, ask your nurse or doctor.        atorvastatin 20 MG tablet Commonly known as: LIPITOR Take 20 mg by mouth  at bedtime.   azaTHIOprine 50 MG tablet Commonly known as: IMURAN Take 1 tablet by mouth 2 (two) times daily.   ferrous sulfate 325 (65 FE) MG EC tablet Take 325 mg by mouth daily.   gabapentin 300 MG capsule Commonly known as: NEURONTIN Take 600 mg by mouth 3 (three) times daily. Take 2 capsules (600mg ) in morning, 1 capsule (300mg ) at midday, and 2 capsules (600mg ) at bedtime   glipiZIDE 10 MG 24 hr tablet Commonly known as: GLUCOTROL XL Take 1 tablet by mouth daily.   levocetirizine 5 MG tablet Commonly known as: XYZAL Take 5 mg by mouth at bedtime.   metFORMIN 500 MG tablet Commonly known as:  GLUCOPHAGE Take 1 tablet by mouth 2 (two) times daily.   metolazone 2.5 MG tablet Commonly known as: ZAROXOLYN Take 2.5 mg by mouth as needed (fluid retention). Take once weekly as needed   metoprolol succinate 50 MG 24 hr tablet Commonly known as: TOPROL-XL Take 50 mg by mouth daily.   oxyCODONE 5 MG immediate release tablet Commonly known as: Oxy IR/ROXICODONE Take 1 tablet (5 mg total) by mouth every 6 (six) hours as needed for severe pain.   pantoprazole 40 MG tablet Commonly known as: PROTONIX Take 40 mg by mouth daily.   potassium chloride 10 MEQ CR capsule Commonly known as: MICRO-K Take 20 mEq by mouth 2 (two) times daily.   rOPINIRole 2 MG tablet Commonly known as: REQUIP Take 0.5 tablets (1 mg total) by mouth at bedtime.   spironolactone 50 MG tablet Commonly known as: ALDACTONE Take 75 mg by mouth daily.   tiZANidine 2 MG tablet Commonly known as: ZANAFLEX Take 2 mg by mouth daily as needed.   torsemide 20 MG tablet Commonly known as: DEMADEX Take 100 mg by mouth 2 (two) times daily.   traZODone 50 MG tablet Commonly known as: DESYREL Take 50 mg by mouth at bedtime.   venlafaxine XR 150 MG 24 hr capsule Commonly known as: EFFEXOR-XR Take 150 mg by mouth daily.   Vitamin D (Ergocalciferol) 1.25 MG (50000 UT) Caps capsule Commonly known as: DRISDOL Take 50,000 Units by mouth every Tuesday.   warfarin 1 MG tablet Commonly known as: COUMADIN Take 8 mg by mouth 2 (two) times a week. Take 8mg  Tuesday and Thursday   warfarin 5 MG tablet Commonly known as: COUMADIN Take 7 mg by mouth daily at 8 pm. Take 7MG  on Monday,Wednesday,Friday,Saturday, and Sunday       Allergies:  Allergies  Allergen Reactions  . Flecainide Shortness Of Breath and Other (See Comments)    Reaction: dizziness   . Amiodarone Other (See Comments)    Pt states that this medication causes lung bleeding.      Family History: Family History  Problem Relation Age of Onset  .  Heart attack Father   . Heart failure Father   . Arthritis Father   . Stroke Father   . Hypertension Father   . Coronary artery disease Brother   . Peripheral vascular disease Brother   . Arthritis Mother   . Colon cancer Mother        colon cancer  . Hypertension Mother   . Cancer Maternal Grandmother        colon cancer  . Arthritis Maternal Grandmother     Social History:  reports that she has quit smoking. Her smoking use included cigarettes. She has a 7.50 pack-year smoking history. She has never used smokeless tobacco. She reports that she  does not drink alcohol or use drugs.  ROS: UROLOGY Frequent Urination?: No Hard to postpone urination?: Yes Burning/pain with urination?: No Get up at night to urinate?: No Leakage of urine?: No Urine stream starts and stops?: No Trouble starting stream?: No Do you have to strain to urinate?: No Blood in urine?: No Urinary tract infection?: Yes Sexually transmitted disease?: No Injury to kidneys or bladder?: No Painful intercourse?: No Weak stream?: No Currently pregnant?: No Vaginal bleeding?: No Last menstrual period?: n  Gastrointestinal Nausea?: No Vomiting?: No Indigestion/heartburn?: No Diarrhea?: No Constipation?: No  Constitutional Fever: No Night sweats?: No Weight loss?: No Fatigue?: No  Skin Skin rash/lesions?: No Itching?: Yes  Eyes Blurred vision?: No Double vision?: No  Ears/Nose/Throat Sore throat?: No Sinus problems?: No  Hematologic/Lymphatic Swollen glands?: No Easy bruising?: Yes  Cardiovascular Leg swelling?: Yes Chest pain?: Yes  Respiratory Cough?: No Shortness of breath?: No  Endocrine Excessive thirst?: No  Musculoskeletal Back pain?: Yes Joint pain?: No  Neurological Headaches?: No Dizziness?: No  Psychologic Depression?: No Anxiety?: No  Physical Exam: BP (!) 148/70   Pulse 79   Ht 5\' 9"  (1.753 m)   Wt 225 lb (102.1 kg)   BMI 33.23 kg/m   Constitutional:   Alert and oriented, No acute distress.  Unsteady. HEENT: [place] AT, moist mucus membranes.  Trachea midline, no masses. Cardiovascular: No clubbing, cyanosis, or edema. Respiratory: Normal respiratory effort, no increased work of breathing. Skin: No rashes, bruises or suspicious lesions. 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  Normal urethral meatus. Neurologic: Grossly intact, no focal deficits, moving all 4 extremities. Psychiatric: Normal mood and affect.  Laboratory Data: Lab Results  Component Value Date   WBC 10.0 08/06/2019   HGB 11.6 (L) 08/06/2019   HCT 36.2 08/06/2019   MCV 93.8 08/06/2019   PLT 218 08/06/2019    Lab Results  Component Value Date   CREATININE 1.22 (H) 08/06/2019     Lab Results  Component Value Date   HGBA1C 7.1 (H) 08/05/2019    Urinalysis Results for orders placed or performed in visit on 10/12/19  Microscopic Examination   URINE  Result Value Ref Range   WBC, UA >30 (A) 0 - 5 /hpf   RBC 0-2 0 - 2 /hpf   Epithelial Cells (non renal) 0-10 0 - 10 /hpf   Bacteria, UA Few None seen/Few  Urinalysis, Complete  Result Value Ref Range   Specific Gravity, UA 1.020 1.005 - 1.030   pH, UA 7.0 5.0 - 7.5   Color, UA Yellow Yellow   Appearance Ur Cloudy (A) Clear   Leukocytes,UA 2+ (A) Negative   Protein,UA Negative Negative/Trace   Glucose, UA Negative Negative   Ketones, UA Negative Negative   RBC, UA Trace (A) Negative   Bilirubin, UA Negative Negative   Urobilinogen, Ur 0.2 0.2 - 1.0 mg/dL   Nitrite, UA Negative Negative   Microscopic Examination See below:     Assessment & Plan:    1. Recurrent UTI Patient has a history of "recurrent UTIs" however based on review of symptoms with each associated positive urine culture, she was never symptomatic from her baseline severe mixed urinary incontinence.  She never had dysuria, or any other systemic signs of infection.  Again, she is asymptomatic  today.  Her UA appears suspicious again and likely is related to either chronic bacteriuria or contamination.  We discussed antibiotic stewardship today at length.  We will hold  off on treating her unless she is truly symptomatic.  She is agreeable this plan.  We will go ahead and send a culture today as a precaution to assess for sensitivity data in case she does become symptomatic.  UA obtained today via cathed specimen  I like her return in a few weeks for reassessment and possible cath UA in order to assess whether or not this is a poor collection technique versus chronic bacteriuria.  She is agreeable this plan.  - Urinalysis, Complete - CULTURE, URINE COMPREHENSIVE   2. Mixed incontinence Symptoms at baseline, severe  Not interested in pursuing any further therapy at this point time based on her adverse outcomes in the past following Botox  Return in about 4 weeks (around 11/09/2019) for with Sam for possible cath UA/ UCx, PVR, recheck bladder syptoms.  Hollice Espy, MD  [facility] [address] [place], [place] [address] [phone]  I spent 25 min with this patient of which greater than 50% was spent in counseling and coordination of care with the patient.  Extensive review of medical records was performed today.  Extensive review of medical records today including review most recent hospitalizations over the past 12 months.

## 2019-10-12 NOTE — Progress Notes (Signed)
In and Out Catheterization  Patient is present today for a I & O catheterization due to UTI. Patient was cleaned and prepped in a sterile fashion with betadine and Lidocaine 2% jelly was instilled into the urethra.  A 14FR cath was inserted no complications were noted , 159ml of urine return was noted, urine was yellow in color. A clean urine sample was collected for UA. Bladder was drained  And catheter was removed with out difficulty.    Preformed by: Fonnie Jarvis, CMA and Hollice Espy, MD

## 2019-10-13 LAB — URINALYSIS, COMPLETE
Bilirubin, UA: NEGATIVE
Glucose, UA: NEGATIVE
Ketones, UA: NEGATIVE
Nitrite, UA: NEGATIVE
Protein,UA: NEGATIVE
Specific Gravity, UA: 1.02 (ref 1.005–1.030)
Urobilinogen, Ur: 0.2 mg/dL (ref 0.2–1.0)
pH, UA: 7 (ref 5.0–7.5)

## 2019-10-13 LAB — MICROSCOPIC EXAMINATION: WBC, UA: >30 /hpf — AB (ref 0–5)

## 2019-10-14 ENCOUNTER — Telehealth: Payer: Self-pay | Admitting: Urology

## 2019-10-14 NOTE — Telephone Encounter (Signed)
Pt. States the burning has intensified in the past two days and  would like results of ucx and abx as soon as possible.

## 2019-10-14 NOTE — Telephone Encounter (Signed)
Unfortunately her previous cultures grew ESBL E. coli and highly resistant organisms.  I strongly prefer that she wait until tomorrow, hopefully the culture will be back.  I think if we start her on something oral, it is very likely to be the wrong antibiotic based on her previous cultures.  Unless she is having fevers, please have her take over-the-counter Pyridium/Azo and will hopefully have the results tomorrow.  Hollice Espy, MD

## 2019-10-14 NOTE — Telephone Encounter (Signed)
Patient notified

## 2019-10-15 LAB — CULTURE, URINE COMPREHENSIVE

## 2019-10-18 ENCOUNTER — Telehealth: Payer: Self-pay | Admitting: *Deleted

## 2019-10-18 MED ORDER — SULFAMETHOXAZOLE-TRIMETHOPRIM 800-160 MG PO TABS: 1.0000 | ORAL_TABLET | Freq: Two times a day (BID) | ORAL | 0 refills | Status: DC

## 2019-10-18 NOTE — Telephone Encounter (Signed)
Pt. Returned missed call. Informed pt. Of Dr. Cherrie Gauze message and Abx sent to pharmacy.

## 2019-10-18 NOTE — Telephone Encounter (Addendum)
Attempted to leave VM-unable to leave VM on cell phone   ----- Message from Hollice Espy, MD sent at 10/18/2019  7:54 AM EDT ----- Betty [name] news, you grew a different bacteria, Citrobacter which is sensitive to Bactrim.  Let us treat you for a 7-day course, twice daily.  Let us know if your symptoms fail to resolve or do not return to your previous baseline.  Hollice Espy, MD

## 2019-11-09 ENCOUNTER — Ambulatory Visit: Payer: Medicare Other | Admitting: Physician Assistant

## 2019-11-10 ENCOUNTER — Ambulatory Visit: Payer: Medicare Other | Admitting: Physician Assistant

## 2019-11-15 ENCOUNTER — Other Ambulatory Visit: Payer: Self-pay | Admitting: Family Medicine

## 2019-11-15 DIAGNOSIS — M79671 Pain in right foot: Secondary | ICD-10-CM

## 2019-11-22 ENCOUNTER — Ambulatory Visit: Payer: Medicare Other | Admitting: Physician Assistant

## 2019-11-22 ENCOUNTER — Other Ambulatory Visit: Payer: Self-pay

## 2019-11-22 ENCOUNTER — Encounter: Payer: Self-pay | Admitting: Physician Assistant

## 2019-11-22 VITALS — BP 115/79 | Ht 69.0 in | Wt 230.0 lb

## 2019-11-22 DIAGNOSIS — N952 Postmenopausal atrophic vaginitis: Secondary | ICD-10-CM

## 2019-11-22 DIAGNOSIS — N390 Urinary tract infection, site not specified: Secondary | ICD-10-CM | POA: Diagnosis not present

## 2019-11-22 LAB — MICROSCOPIC EXAMINATION

## 2019-11-22 LAB — URINALYSIS, COMPLETE
Bilirubin, UA: NEGATIVE
Ketones, UA: NEGATIVE
Nitrite, UA: NEGATIVE
Protein,UA: NEGATIVE
Specific Gravity, UA: 1.015 (ref 1.005–1.030)
Urobilinogen, Ur: 0.2 mg/dL (ref 0.2–1.0)
pH, UA: 7 (ref 5.0–7.5)

## 2019-11-22 NOTE — Progress Notes (Signed)
11/22/2019 3:28 PM   Betty [name] 07-02-1946 [id number]  CC: Symptom reassessment  HPI: Betty [name] is a 73 y.o. female who presents today for symptom reevaluation. She is an established [facility] patient who last saw Dr. Erlene Quan on 10/12/2019 for evaluation of "recurrent UTI."  At that visit, it was established that patient had never displayed irritative voiding symptoms at the time of her positive urine cultures.  Per her notes, Dr. Erlene Quan suspects chronic bacteriuria complicated by severe mixed incontinence rather than recurrent UTI.  2 days after this visit, she contacted the office to report dysuria.  Urine culture grew Citrobacter; she was treated with Bactrim twice daily x7 days.    She reports minimal symptom improvement following treatment and states she is at her baseline level of dysuria, urgency, frequency, and urinary incontinence today.  In-office UA today positive for trace-intact blood and 1+ leukocyte esterase; urine microscopy with 6-10 WBCs/HPF and many bacteria.   PMH: Past Medical History:  Diagnosis Date  . Acute diastolic heart failure ([place])   . Allergy   . 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 Asthma   . Atrial fibrillation ([place])   . Backache, unspecified   . Cancer ([place])    skin  . Cardiomegaly   . COPD (chronic obstructive pulmonary disease) ([place])   . Diabetes mellitus without complication ([place])   . 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 Esophageal reflux   . Headache(784.0)   . Herpes zoster without mention of complication   . Hx: UTI (urinary tract infection)   . Hypertension    heart controlled w CHF  . Nontoxic uninodular goiter   . Obesity, unspecified   . Osteoarthrosis, unspecified whether generalized or localized, unspecified site   . Unspecified sleep apnea   . Urine incontinence    hx of    Surgical History: Past Surgical History:  Procedure Laterality Date  . ABDOMINAL HYSTERECTOMY  1979   complete  (for precancerous cells)  . ABLATION  2011 & 2014  . bladder botox  01/26/2018  . CHOLECYSTECTOMY    . CYSTOSCOPY WITH FULGERATION N/A 01/28/2018   Procedure: Christie AND CLOT EVACUATION;  Surgeon: Hollice Espy, MD;  Location: [facility] ORS;  Service: Urology;  Laterality: N/A;  . Interstim Placement  2012  . OOPHORECTOMY      Home Medications:  Allergies as of 11/22/2019      Reactions   Flecainide Shortness Of Breath, Other (See Comments)   Reaction: dizziness    Amiodarone Other (See Comments)   Pt states that this medication causes lung bleeding.        Medication List       Accurate as of November 22, 2019  3:28 PM. If you have any questions, ask your nurse or doctor.        STOP taking these medications   oxyCODONE 5 MG immediate release tablet Commonly known as: Oxy IR/ROXICODONE Stopped by: Debroah Loop, PA-C   sulfamethoxazole-trimethoprim 800-160 MG tablet Commonly known as: BACTRIM DS Stopped by: Debroah Loop, PA-C     TAKE these medications   atorvastatin 20 MG tablet Commonly known as: LIPITOR Take 20 mg by mouth at bedtime.   azaTHIOprine 50 MG tablet Commonly known as: IMURAN Take 1 tablet by mouth 2 (two) times daily.   ferrous sulfate 325 (65 FE) MG EC tablet Take 325 mg by mouth daily.   gabapentin 300 MG capsule Commonly known as: NEURONTIN Take  600 mg by mouth 3 (three) times daily. Take 2 capsules (600mg ) in morning, 1 capsule (300mg ) at midday, and 2 capsules (600mg ) at bedtime   glipiZIDE 10 MG 24 hr tablet Commonly known as: GLUCOTROL XL Take 1 tablet by mouth daily.   levocetirizine 5 MG tablet Commonly known as: XYZAL Take 5 mg by mouth at bedtime.   metFORMIN 500 MG tablet Commonly known as: GLUCOPHAGE Take 1 tablet by mouth 2 (two) times daily.   metolazone 2.5 MG tablet Commonly known as: ZAROXOLYN Take 2.5 mg by mouth as needed (fluid retention). Take once weekly as needed   metoprolol  succinate 50 MG 24 hr tablet Commonly known as: TOPROL-XL Take 50 mg by mouth daily.   pantoprazole 40 MG tablet Commonly known as: PROTONIX Take 40 mg by mouth daily.   potassium chloride 10 MEQ CR capsule Commonly known as: MICRO-K Take 20 mEq by mouth 2 (two) times daily.   rOPINIRole 2 MG tablet Commonly known as: REQUIP Take 0.5 tablets (1 mg total) by mouth at bedtime.   spironolactone 50 MG tablet Commonly known as: ALDACTONE Take 75 mg by mouth daily.   tiZANidine 2 MG tablet Commonly known as: ZANAFLEX Take 2 mg by mouth daily as needed.   torsemide 20 MG tablet Commonly known as: DEMADEX Take 100 mg by mouth 2 (two) times daily.   traZODone 50 MG tablet Commonly known as: DESYREL Take 50 mg by mouth at bedtime.   venlafaxine XR 150 MG 24 hr capsule Commonly known as: EFFEXOR-XR Take 150 mg by mouth daily.   Vitamin D (Ergocalciferol) 1.25 MG (50000 UT) Caps capsule Commonly known as: DRISDOL Take 50,000 Units by mouth every Tuesday.   warfarin 1 MG tablet Commonly known as: COUMADIN Take 8 mg by mouth 2 (two) times a week. Take 8mg  Tuesday and Thursday   warfarin 5 MG tablet Commonly known as: COUMADIN Take 7 mg by mouth daily at 8 pm. Take 7MG  on Monday,Wednesday,Friday,Saturday, and Sunday       Allergies:  Allergies  Allergen Reactions  . Flecainide Shortness Of Breath and Other (See Comments)    Reaction: dizziness   . Amiodarone Other (See Comments)    Pt states that this medication causes lung bleeding.      Family History: Family History  Problem Relation Age of Onset  . Heart attack Father   . Heart failure Father   . Arthritis Father   . Stroke Father   . Hypertension Father   . Coronary artery disease Brother   . Peripheral vascular disease Brother   . Arthritis Mother   . Colon cancer Mother        colon cancer  . Hypertension Mother   . Cancer Maternal Grandmother        colon cancer  . Arthritis Maternal Grandmother      Social History:   reports that she has quit smoking. Her smoking use included cigarettes. She has a 7.50 pack-year smoking history. She has never used smokeless tobacco. She reports that she does not drink alcohol or use drugs.  ROS: UROLOGY Frequent Urination?: Yes Hard to postpone urination?: Yes Burning/pain with urination?: No Get up at night to urinate?: Yes Leakage of urine?: Yes Urine stream starts and stops?: No Trouble starting stream?: No Do you have to strain to urinate?: No Blood in urine?: No Urinary tract infection?: Yes Sexually transmitted disease?: No Injury to kidneys or bladder?: No Painful intercourse?: No Weak stream?: No Currently pregnant?:  No Vaginal bleeding?: No Last menstrual period?: n  Gastrointestinal Nausea?: No Vomiting?: No Indigestion/heartburn?: No Diarrhea?: No Constipation?: No  Constitutional Fever: No Night sweats?: No Weight loss?: No Fatigue?: Yes  Skin Skin rash/lesions?: No Itching?: No  Eyes Blurred vision?: No Double vision?: No  Ears/Nose/Throat Sore throat?: No Sinus problems?: No  Hematologic/Lymphatic Swollen glands?: No Easy bruising?: No  Cardiovascular Leg swelling?: Yes Chest pain?: No  Respiratory Cough?: No Shortness of breath?: Yes  Endocrine Excessive thirst?: No  Musculoskeletal Back pain?: Yes Joint pain?: No  Neurological Headaches?: No Dizziness?: No  Psychologic Depression?: No Anxiety?: No  Physical Exam: BP 115/79   Ht 5\' 9"  (1.753 m)   Wt 230 lb (104.3 kg)   BMI 33.97 kg/m   Constitutional:  Alert and oriented, no acute distress, nontoxic appearing HEENT: [place], AT Cardiovascular: No clubbing, cyanosis, or edema Respiratory: Increased work of breathing, on supplemental oxygen GU: Red-purple discoloration of the labia majora with chapped appearance suggestive of moisture exposure.  Glossy appearance of the labia minora. Skin: No rashes, bruises or suspicious lesions  Neurologic: Grossly intact, no focal deficits, moving all 4 extremities; requires standby assist for transfer Psychiatric: Normal mood and affect  Laboratory Data: Results for orders placed or performed in visit on 11/22/19  Microscopic Examination   URINE  Result Value Ref Range   WBC, UA 6-10 (A) 0 - 5 /hpf   RBC 0-2 0 - 2 /hpf   Epithelial Cells (non renal) 0-10 0 - 10 /hpf   Bacteria, UA Many (A) None seen/Few  Urinalysis, Complete  Result Value Ref Range   Specific Gravity, UA 1.015 1.005 - 1.030   pH, UA 7.0 5.0 - 7.5   Color, UA Yellow Yellow   Appearance Ur Hazy (A) Clear   Leukocytes,UA 1+ (A) Negative   Protein,UA Negative Negative/Trace   Glucose, UA Trace (A) Negative   Ketones, UA Negative Negative   RBC, UA Trace (A) Negative   Bilirubin, UA Negative Negative   Urobilinogen, Ur 0.2 0.2 - 1.0 mg/dL   Nitrite, UA Negative Negative   Microscopic Examination See below:    Assessment & Plan:   1. Recurrent UTI 73 year old female with history of positive urine cultures here for symptom reassessment following treatment for possible UTI with baseline urinary symptoms of dysuria, urgency, frequency, and severe incontinence.  I&O catheterization performed today, procedure note below.  She denies worsened voiding symptoms today and states she is at baseline.  UA significant for mild pyuria and bacteriuria.  Will send for culture.  I believe her UA is consistent with chronic bacteriuria rather than infection.  I will not treat her urine culture in the absence of worsened urinary symptoms.  In and Out Catheterization Patient is present today for I & O catheterization due to prior sample contamination. Patient was cleaned and prepped in a sterile fashion with betadine.  A 14FR cath was inserted no complications were noted, 253ml of urine return was noted, urine was clear yellow in color. A clean urine sample was collected for UA and culture. Bladder was drained and catheter was  removed without difficulty.    Performed by: Debroah Loop, PA-C and Fonnie Jarvis, CMA - Urinalysis, Complete - CULTURE, URINE COMPREHENSIVE  2. Atrophic vaginitis Patient with signs of atrophic vaginitis on physical exam today.  Additionally, it is unclear if her "burning" symptom is true dysuria versus dermatitis in the setting of severe urinary incontinence.  She denies a history of breast cancer.  Starting  her on topical vaginal estrogen cream today for treatment of atrophic vaginitis.  I provided her with Premarin samples and advised her to use a pea-sized amount 3 times weekly at the urethral meatus.  I advised her that she may not see a benefit from this medication for several weeks to months of consistent use.  She expressed understanding.  Return in about 3 months (around 02/20/2020) for [place] f/u.  Debroah Loop, PA-C  [facility] [address] [place], [place] [address] [phone]

## 2019-11-23 ENCOUNTER — Ambulatory Visit
Admission: RE | Admit: 2019-11-23 | Discharge: 2019-11-23 | Disposition: A | Discharge status: Home / Self Care | Payer: Medicare Other | Source: Ambulatory Visit | Attending: Family Medicine | Admitting: Family Medicine

## 2019-11-23 DIAGNOSIS — M79672 Pain in left foot: Secondary | ICD-10-CM | POA: Diagnosis present

## 2019-11-23 DIAGNOSIS — M79671 Pain in right foot: Secondary | ICD-10-CM | POA: Diagnosis not present

## 2019-11-26 LAB — CULTURE, URINE COMPREHENSIVE

## 2019-11-26 NOTE — Progress Notes (Signed)
Patient suspected to be chronically colonized with baseline urinary symptoms at the time of draw. Will not treat.

## 2020-02-23 ENCOUNTER — Ambulatory Visit: Payer: Medicare Other | Admitting: Urology

## 2020-02-28 IMAGING — MG DIGITAL SCREENING BILATERAL MAMMOGRAM WITH TOMO AND CAD
8 series · 8 of 24 positions shown · non-contrast
Comparison: Previous exam(s).

CLINICAL DATA: Screening.

EXAM:
DIGITAL SCREENING BILATERAL MAMMOGRAM WITH TOMO AND CAD

[R MLO synth-2D]
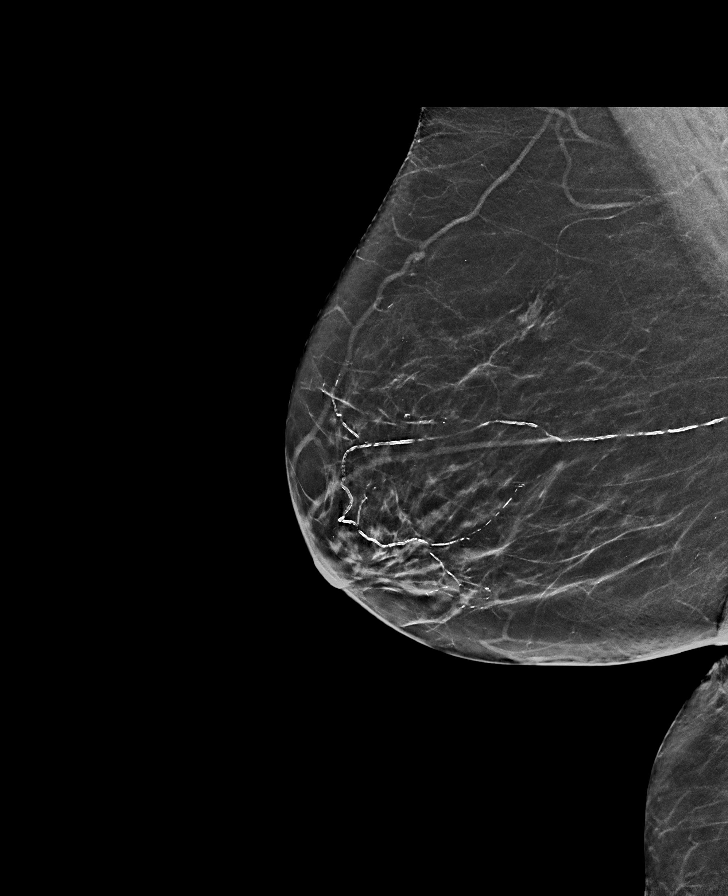

[L MLO synth-2D]
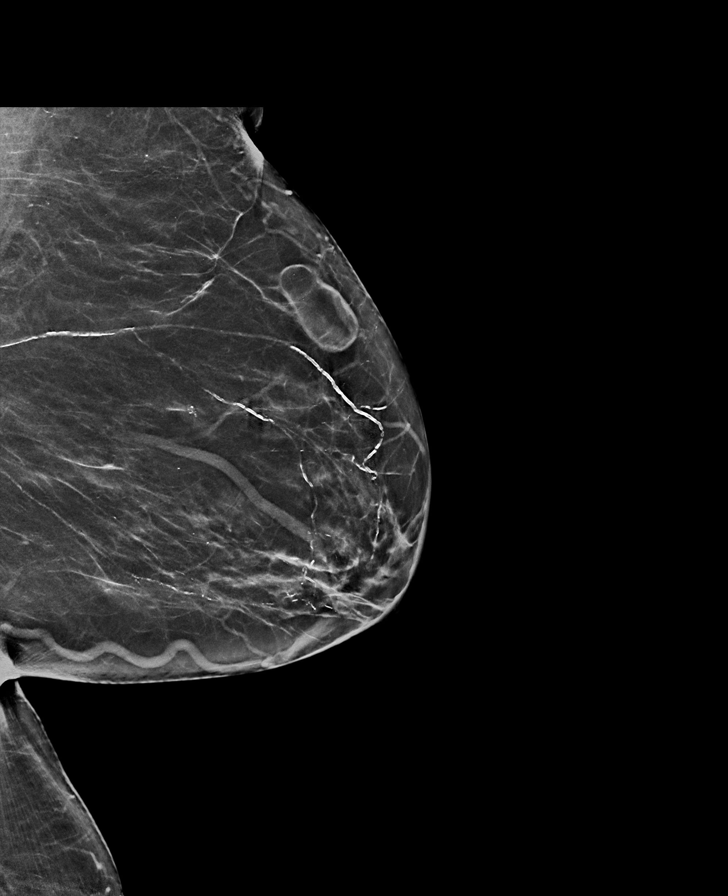

[L CC synth-2D]
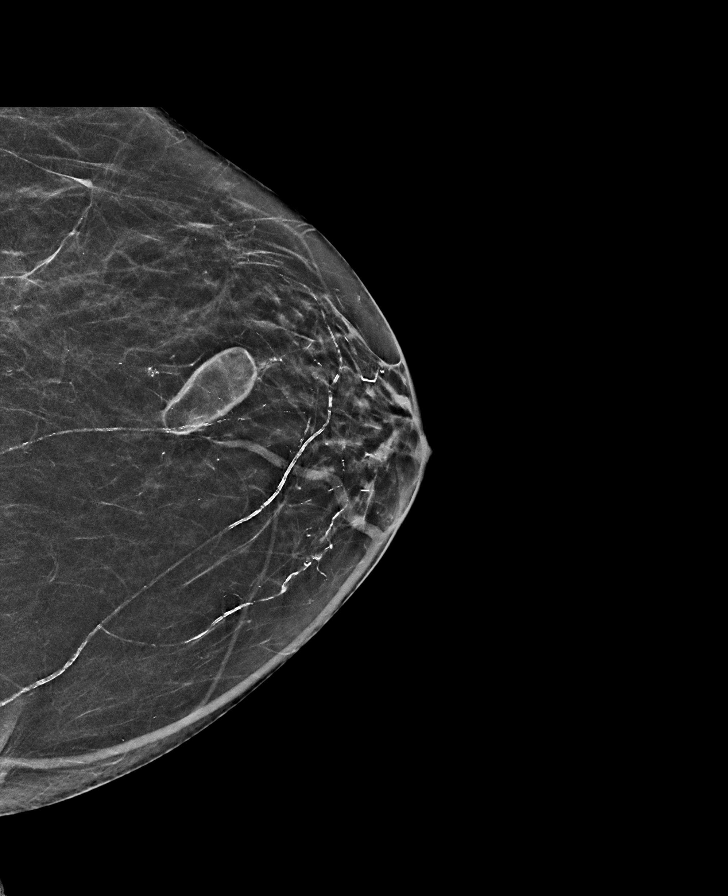

[R CC synth-2D]
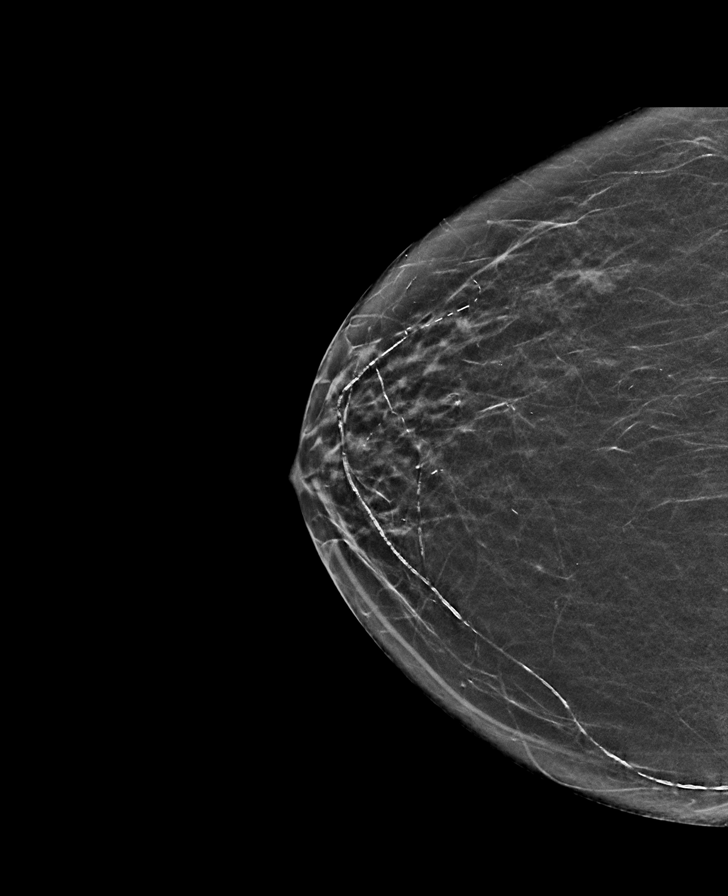

[L CC tomo · tomo slice 27/53.0]
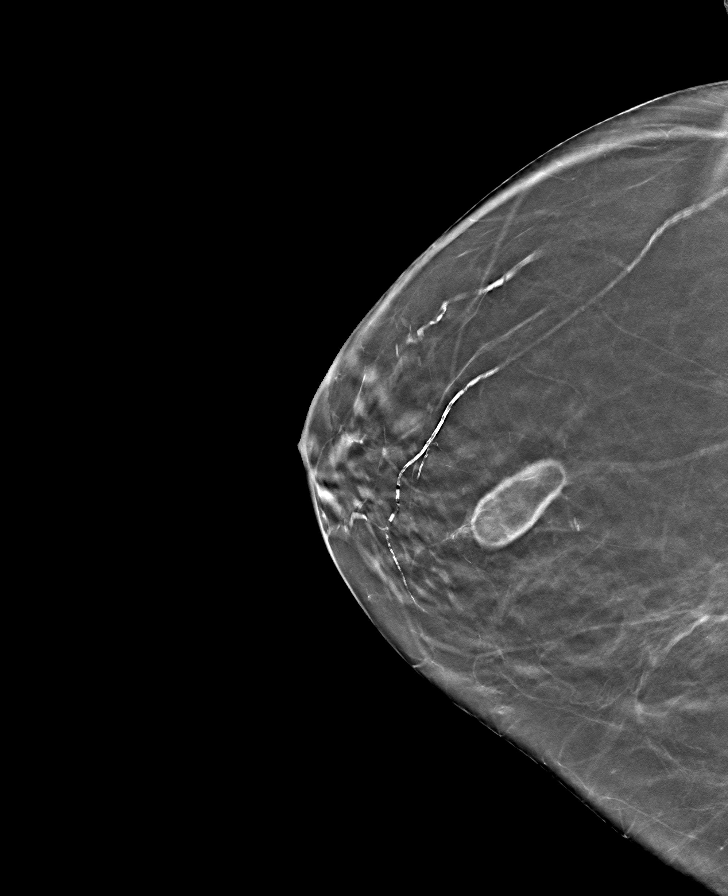

[R MLO tomo · tomo slice 31/62.0]
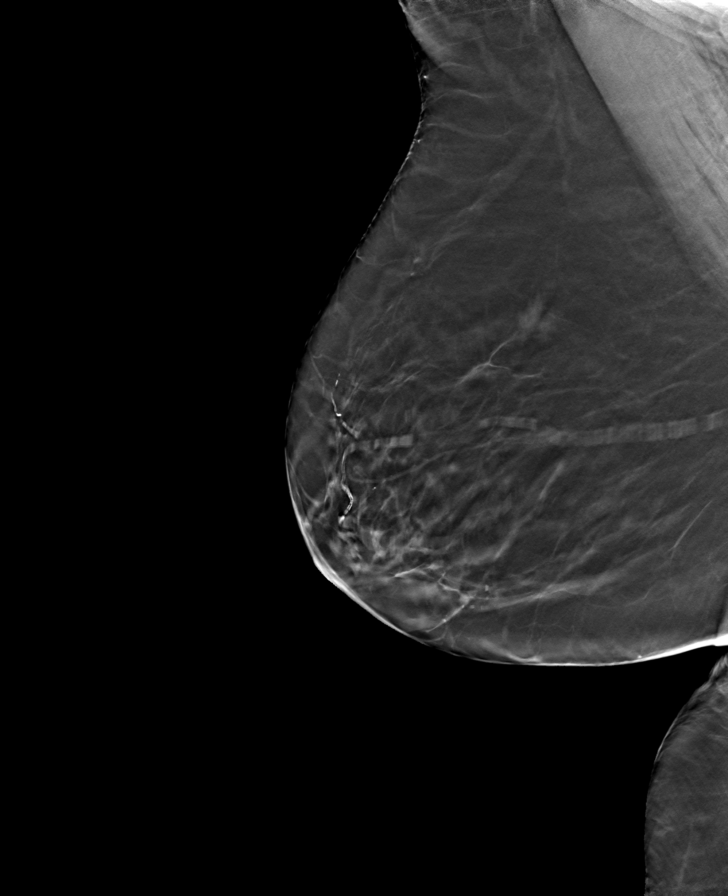

[L MLO tomo · tomo slice 33/65.0]
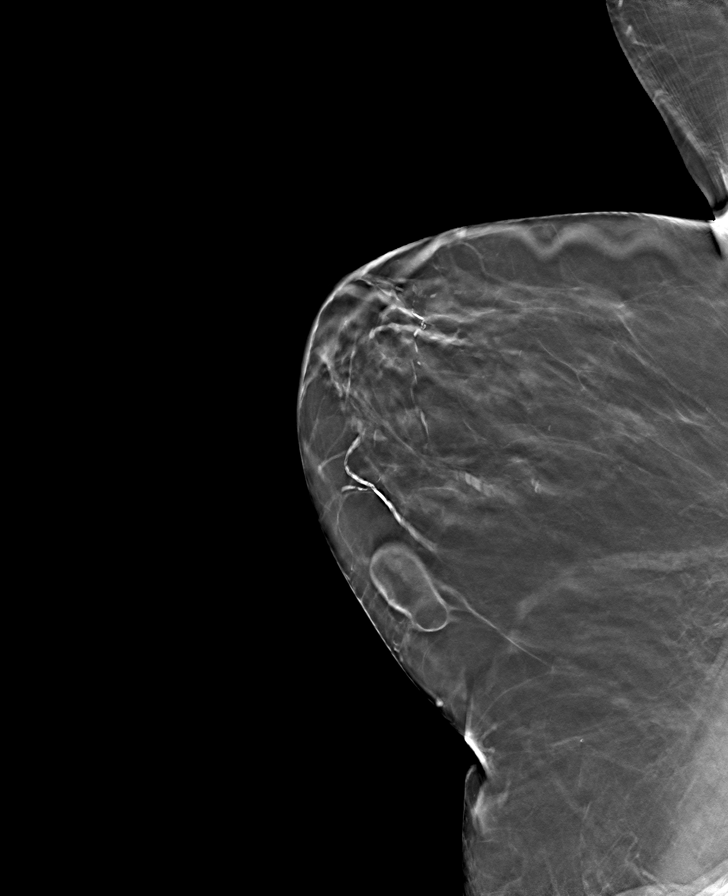

[R CC tomo · tomo slice 25/50.0]
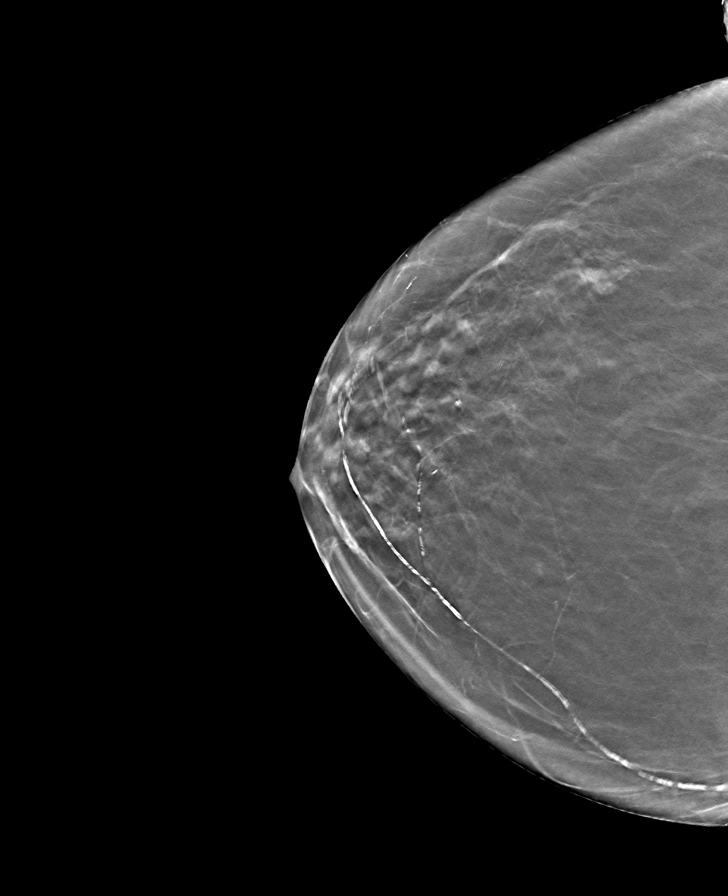

[8 of 24 positions shown; findings below may reference images not displayed]

ACR Breast Density Category b: There are scattered areas of
fibroglandular density.
FINDINGS: There are no findings suspicious for malignancy. Images were
processed with CAD.
IMPRESSION: No mammographic evidence of malignancy. A result letter of this
screening mammogram will be mailed directly to the patient.

RECOMMENDATION:
Screening mammogram in one year. (Code:[id number])

BI-RADS CATEGORY  1: Negative.

## 2020-06-07 ENCOUNTER — Other Ambulatory Visit: Payer: Self-pay

## 2020-06-07 ENCOUNTER — Inpatient Hospital Stay
Admit: 2020-06-07 | Discharge: 2020-06-19 | DRG: 871 | Disposition: A | Discharge status: Skilled Nursing Facility | Payer: Medicare PPO | Attending: Internal Medicine | Admitting: Internal Medicine

## 2020-06-07 DIAGNOSIS — I776 Arteritis, unspecified: Secondary | ICD-10-CM | POA: Diagnosis present

## 2020-06-07 DIAGNOSIS — J449 Chronic obstructive pulmonary disease, unspecified: Secondary | ICD-10-CM | POA: Diagnosis present

## 2020-06-07 DIAGNOSIS — E876 Hypokalemia: Secondary | ICD-10-CM | POA: Diagnosis present

## 2020-06-07 DIAGNOSIS — Z952 Presence of prosthetic heart valve: Secondary | ICD-10-CM | POA: Diagnosis not present

## 2020-06-07 DIAGNOSIS — Z90710 Acquired absence of both cervix and uterus: Secondary | ICD-10-CM

## 2020-06-07 DIAGNOSIS — Z86711 Personal history of pulmonary embolism: Secondary | ICD-10-CM

## 2020-06-07 DIAGNOSIS — I4891 Unspecified atrial fibrillation: Secondary | ICD-10-CM | POA: Diagnosis present

## 2020-06-07 DIAGNOSIS — Z6833 Body mass index (BMI) 33.0-33.9, adult: Secondary | ICD-10-CM

## 2020-06-07 DIAGNOSIS — Z9049 Acquired absence of other specified parts of digestive tract: Secondary | ICD-10-CM

## 2020-06-07 DIAGNOSIS — Z7984 Long term (current) use of oral hypoglycemic drugs: Secondary | ICD-10-CM

## 2020-06-07 DIAGNOSIS — D62 Acute posthemorrhagic anemia: Secondary | ICD-10-CM | POA: Diagnosis present

## 2020-06-07 DIAGNOSIS — R32 Unspecified urinary incontinence: Secondary | ICD-10-CM | POA: Diagnosis present

## 2020-06-07 DIAGNOSIS — R6521 Severe sepsis with septic shock: Secondary | ICD-10-CM | POA: Diagnosis present

## 2020-06-07 DIAGNOSIS — Z9981 Dependence on supplemental oxygen: Secondary | ICD-10-CM

## 2020-06-07 DIAGNOSIS — R579 Shock, unspecified: Secondary | ICD-10-CM | POA: Diagnosis not present

## 2020-06-07 DIAGNOSIS — Z8261 Family history of arthritis: Secondary | ICD-10-CM

## 2020-06-07 DIAGNOSIS — K921 Melena: Secondary | ICD-10-CM | POA: Diagnosis present

## 2020-06-07 DIAGNOSIS — E872 Acidosis: Secondary | ICD-10-CM | POA: Diagnosis present

## 2020-06-07 DIAGNOSIS — I4811 Longstanding persistent atrial fibrillation: Secondary | ICD-10-CM | POA: Diagnosis present

## 2020-06-07 DIAGNOSIS — R578 Other shock: Secondary | ICD-10-CM | POA: Diagnosis present

## 2020-06-07 DIAGNOSIS — I272 Pulmonary hypertension, unspecified: Secondary | ICD-10-CM | POA: Diagnosis present

## 2020-06-07 DIAGNOSIS — K2970 Gastritis, unspecified, without bleeding: Secondary | ICD-10-CM | POA: Diagnosis present

## 2020-06-07 DIAGNOSIS — R9389 Abnormal findings on diagnostic imaging of other specified body structures: Secondary | ICD-10-CM

## 2020-06-07 DIAGNOSIS — Z950 Presence of cardiac pacemaker: Secondary | ICD-10-CM

## 2020-06-07 DIAGNOSIS — G4733 Obstructive sleep apnea (adult) (pediatric): Secondary | ICD-10-CM | POA: Diagnosis present

## 2020-06-07 DIAGNOSIS — N179 Acute kidney failure, unspecified: Secondary | ICD-10-CM | POA: Diagnosis present

## 2020-06-07 DIAGNOSIS — I781 Nevus, non-neoplastic: Secondary | ICD-10-CM | POA: Diagnosis present

## 2020-06-07 DIAGNOSIS — I130 Hypertensive heart and chronic kidney disease with heart failure and stage 1 through stage 4 chronic kidney disease, or unspecified chronic kidney disease: Secondary | ICD-10-CM | POA: Diagnosis present

## 2020-06-07 DIAGNOSIS — Z888 Allergy status to other drugs, medicaments and biological substances status: Secondary | ICD-10-CM

## 2020-06-07 DIAGNOSIS — F05 Delirium due to known physiological condition: Secondary | ICD-10-CM | POA: Diagnosis not present

## 2020-06-07 DIAGNOSIS — E871 Hypo-osmolality and hyponatremia: Secondary | ICD-10-CM | POA: Diagnosis present

## 2020-06-07 DIAGNOSIS — E041 Nontoxic single thyroid nodule: Secondary | ICD-10-CM | POA: Diagnosis present

## 2020-06-07 DIAGNOSIS — T45515A Adverse effect of anticoagulants, initial encounter: Secondary | ICD-10-CM | POA: Diagnosis present

## 2020-06-07 DIAGNOSIS — N390 Urinary tract infection, site not specified: Secondary | ICD-10-CM | POA: Diagnosis present

## 2020-06-07 DIAGNOSIS — K219 Gastro-esophageal reflux disease without esophagitis: Secondary | ICD-10-CM | POA: Diagnosis present

## 2020-06-07 DIAGNOSIS — D6832 Hemorrhagic disorder due to extrinsic circulating anticoagulants: Secondary | ICD-10-CM | POA: Diagnosis present

## 2020-06-07 DIAGNOSIS — R6510 Systemic inflammatory response syndrome (SIRS) of non-infectious origin without acute organ dysfunction: Secondary | ICD-10-CM

## 2020-06-07 DIAGNOSIS — E1142 Type 2 diabetes mellitus with diabetic polyneuropathy: Secondary | ICD-10-CM | POA: Diagnosis present

## 2020-06-07 DIAGNOSIS — F329 Major depressive disorder, single episode, unspecified: Secondary | ICD-10-CM | POA: Diagnosis present

## 2020-06-07 DIAGNOSIS — Z7901 Long term (current) use of anticoagulants: Secondary | ICD-10-CM

## 2020-06-07 DIAGNOSIS — I5032 Chronic diastolic (congestive) heart failure: Secondary | ICD-10-CM | POA: Diagnosis present

## 2020-06-07 DIAGNOSIS — F419 Anxiety disorder, unspecified: Secondary | ICD-10-CM | POA: Diagnosis present

## 2020-06-07 DIAGNOSIS — B961 Klebsiella pneumoniae [K. pneumoniae] as the cause of diseases classified elsewhere: Secondary | ICD-10-CM | POA: Diagnosis present

## 2020-06-07 DIAGNOSIS — Z8619 Personal history of other infectious and parasitic diseases: Secondary | ICD-10-CM

## 2020-06-07 DIAGNOSIS — Z20822 Contact with and (suspected) exposure to covid-19: Secondary | ICD-10-CM | POA: Diagnosis present

## 2020-06-07 DIAGNOSIS — Z87891 Personal history of nicotine dependence: Secondary | ICD-10-CM

## 2020-06-07 DIAGNOSIS — Z79899 Other long term (current) drug therapy: Secondary | ICD-10-CM

## 2020-06-07 DIAGNOSIS — K31811 Angiodysplasia of stomach and duodenum with bleeding: Secondary | ICD-10-CM | POA: Diagnosis present

## 2020-06-07 DIAGNOSIS — R040 Epistaxis: Secondary | ICD-10-CM | POA: Diagnosis not present

## 2020-06-07 DIAGNOSIS — N184 Chronic kidney disease, stage 4 (severe): Secondary | ICD-10-CM | POA: Diagnosis present

## 2020-06-07 DIAGNOSIS — E1122 Type 2 diabetes mellitus with diabetic chronic kidney disease: Secondary | ICD-10-CM | POA: Diagnosis present

## 2020-06-07 DIAGNOSIS — E119 Type 2 diabetes mellitus without complications: Secondary | ICD-10-CM

## 2020-06-07 DIAGNOSIS — A419 Sepsis, unspecified organism: Secondary | ICD-10-CM | POA: Diagnosis present

## 2020-06-07 DIAGNOSIS — Z8249 Family history of ischemic heart disease and other diseases of the circulatory system: Secondary | ICD-10-CM

## 2020-06-07 DIAGNOSIS — K922 Gastrointestinal hemorrhage, unspecified: Secondary | ICD-10-CM

## 2020-06-07 DIAGNOSIS — Z86010 Personal history of colonic polyps: Secondary | ICD-10-CM

## 2020-06-07 DIAGNOSIS — M1990 Unspecified osteoarthritis, unspecified site: Secondary | ICD-10-CM | POA: Diagnosis present

## 2020-06-07 DIAGNOSIS — Z86718 Personal history of other venous thrombosis and embolism: Secondary | ICD-10-CM

## 2020-06-07 DIAGNOSIS — I480 Paroxysmal atrial fibrillation: Secondary | ICD-10-CM | POA: Diagnosis not present

## 2020-06-07 DIAGNOSIS — E669 Obesity, unspecified: Secondary | ICD-10-CM | POA: Diagnosis present

## 2020-06-07 LAB — CBC
HCT: 18.6 % — ABNORMAL LOW (ref 36.0–46.0)
Hemoglobin: 6.1 g/dL — ABNORMAL LOW (ref 12.0–15.0)
MCH: 31 pg (ref 26.0–34.0)
MCHC: 32.8 g/dL (ref 30.0–36.0)
MCV: 94.4 fL (ref 80.0–100.0)
Platelets: 325 10*3/uL (ref 150–400)
RBC: 1.97 MIL/uL — ABNORMAL LOW (ref 3.87–5.11)
RDW: 17.7 % — ABNORMAL HIGH (ref 11.5–15.5)
WBC: 18.4 10*3/uL — ABNORMAL HIGH (ref 4.0–10.5)
nRBC: 0 % (ref 0.0–0.2)

## 2020-06-07 LAB — COMPREHENSIVE METABOLIC PANEL
ALT: 17 U/L (ref 0–44)
AST: 34 U/L (ref 15–41)
Albumin: 3.6 g/dL (ref 3.5–5.0)
Alkaline Phosphatase: 79 U/L (ref 38–126)
Anion gap: 21 — ABNORMAL HIGH (ref 5–15)
BUN: 180 mg/dL — ABNORMAL HIGH (ref 8–23)
CO2: 26 mmol/L (ref 22–32)
Calcium: 8.4 mg/dL — ABNORMAL LOW (ref 8.9–10.3)
Chloride: 79 mmol/L — ABNORMAL LOW (ref 98–111)
Creatinine, Ser: 2.67 mg/dL — ABNORMAL HIGH (ref 0.44–1.00)
GFR calc Af Amer: 20 mL/min — ABNORMAL LOW (ref >60)
GFR calc non Af Amer: 17 mL/min — ABNORMAL LOW (ref >60)
Glucose, Bld: 111 mg/dL — ABNORMAL HIGH (ref 70–99)
Potassium: 4.8 mmol/L (ref 3.5–5.1)
Sodium: 126 mmol/L — ABNORMAL LOW (ref 135–145)
Total Bilirubin: 1.1 mg/dL (ref 0.3–1.2)
Total Protein: 6.1 g/dL — ABNORMAL LOW (ref 6.5–8.1)

## 2020-06-07 LAB — PROCALCITONIN: Procalcitonin: 0.37 ng/mL

## 2020-06-07 LAB — GLUCOSE, CAPILLARY
Glucose-Capillary: 66 mg/dL — ABNORMAL LOW (ref 70–99)
Glucose-Capillary: 127 mg/dL — ABNORMAL HIGH (ref 70–99)

## 2020-06-07 LAB — URINALYSIS, COMPLETE (UACMP) WITH MICROSCOPIC
Bilirubin Urine: NEGATIVE
Glucose, UA: NEGATIVE mg/dL
Ketones, ur: NEGATIVE mg/dL
Nitrite: NEGATIVE
Protein, ur: NEGATIVE mg/dL
Specific Gravity, Urine: 1.014 (ref 1.005–1.030)
pH: 5 (ref 5.0–8.0)

## 2020-06-07 LAB — PROTIME-INR
INR: 4.6 (ref 0.8–1.2)
Prothrombin Time: 41.8 seconds — ABNORMAL HIGH (ref 11.4–15.2)

## 2020-06-07 LAB — HEMOGLOBIN AND HEMATOCRIT, BLOOD
HCT: 26.1 % — ABNORMAL LOW (ref 36.0–46.0)
Hemoglobin: 8.6 g/dL — ABNORMAL LOW (ref 12.0–15.0)

## 2020-06-07 LAB — LACTIC ACID, PLASMA
Lactic Acid, Venous: 6.7 mmol/L (ref 0.5–1.9)
Lactic Acid, Venous: 5.9 mmol/L (ref 0.5–1.9)

## 2020-06-07 LAB — MRSA PCR SCREENING: MRSA by PCR: NEGATIVE

## 2020-06-07 LAB — SARS CORONAVIRUS 2 BY RT PCR (HOSPITAL ORDER, PERFORMED IN ~~LOC~~ HOSPITAL LAB): SARS Coronavirus 2: NEGATIVE

## 2020-06-07 LAB — APTT: aPTT: 42 seconds — ABNORMAL HIGH (ref 24–36)

## 2020-06-07 LAB — PREPARE RBC (CROSSMATCH)

## 2020-06-07 LAB — TROPONIN I (HIGH SENSITIVITY): Troponin I (High Sensitivity): 30 ng/L — ABNORMAL HIGH (ref <18)

## 2020-06-07 MED ORDER — FAMOTIDINE IN NACL 20-0.9 MG/50ML-% IV SOLN
20.0000 mg | Freq: Two times a day (BID) | INTRAVENOUS | Status: DC
  Administered 2020-06-07: 20 mg via INTRAVENOUS
  Filled 2020-06-07: qty 50

## 2020-06-07 MED ORDER — SODIUM CHLORIDE 0.9% FLUSH
3.0000 mL | INTRAVENOUS | Status: DC | PRN
  Administered 2020-06-13: 3 mL via INTRAVENOUS

## 2020-06-07 MED ORDER — PANTOPRAZOLE SODIUM 40 MG IV SOLR
40.0000 mg | Freq: Once | INTRAVENOUS | Status: AC
  Administered 2020-06-07: 40 mg via INTRAVENOUS
  Filled 2020-06-07: qty 40

## 2020-06-07 MED ORDER — ONDANSETRON HCL 4 MG/2ML IJ SOLN
4.0000 mg | Freq: Four times a day (QID) | INTRAMUSCULAR | Status: DC | PRN
  Administered 2020-06-14 – 2020-06-17 (×2): 4 mg via INTRAVENOUS
  Filled 2020-06-07 (×2): qty 2

## 2020-06-07 MED ORDER — DEXTROSE 50 % IV SOLN
INTRAVENOUS | Status: AC
  Filled 2020-06-07: qty 50

## 2020-06-07 MED ORDER — POLYETHYLENE GLYCOL 3350 17 G PO PACK: 17.0000 g | PACK | Freq: Every day | ORAL | Status: DC | PRN

## 2020-06-07 MED ORDER — SODIUM CHLORIDE 0.9 % IV SOLN: 250.0000 mL | INTRAVENOUS | Status: DC | PRN

## 2020-06-07 MED ORDER — SODIUM CHLORIDE 0.9% FLUSH
3.0000 mL | Freq: Two times a day (BID) | INTRAVENOUS | Status: DC
  Administered 2020-06-07 – 2020-06-19 (×21): 3 mL via INTRAVENOUS

## 2020-06-07 MED ORDER — INSULIN ASPART 100 UNIT/ML ~~LOC~~ SOLN: 0.0000 [IU] | SUBCUTANEOUS | Status: DC

## 2020-06-07 MED ORDER — CHLORHEXIDINE GLUCONATE CLOTH 2 % EX PADS
6.0000 | MEDICATED_PAD | Freq: Every day | CUTANEOUS | Status: DC
  Administered 2020-06-07 – 2020-06-19 (×12): 6 via TOPICAL

## 2020-06-07 MED ORDER — SODIUM CHLORIDE 0.9 % IV BOLUS
500.0000 mL | Freq: Once | INTRAVENOUS | Status: AC
  Administered 2020-06-07: 500 mL via INTRAVENOUS

## 2020-06-07 MED ORDER — ORAL CARE MOUTH RINSE
15.0000 mL | Freq: Two times a day (BID) | OROMUCOSAL | Status: DC
  Administered 2020-06-08 – 2020-06-19 (×20): 15 mL via OROMUCOSAL

## 2020-06-07 MED ORDER — DEXTROSE 50 % IV SOLN
12.5000 g | INTRAVENOUS | Status: AC
  Administered 2020-06-07: 12.5 g via INTRAVENOUS

## 2020-06-07 MED ORDER — DOCUSATE SODIUM 100 MG PO CAPS
100.0000 mg | ORAL_CAPSULE | Freq: Two times a day (BID) | ORAL | Status: DC | PRN
  Administered 2020-06-14: 100 mg via ORAL
  Filled 2020-06-07: qty 1

## 2020-06-07 MED ORDER — SODIUM CHLORIDE 0.9 % IV SOLN
1.0000 g | INTRAVENOUS | Status: AC
  Administered 2020-06-07 – 2020-06-13 (×7): 1 g via INTRAVENOUS
  Filled 2020-06-07 (×2): qty 1
  Filled 2020-06-07: qty 10
  Filled 2020-06-07: qty 1
  Filled 2020-06-07: qty 10
  Filled 2020-06-07 (×2): qty 1

## 2020-06-07 MED ORDER — SODIUM CHLORIDE 0.9 % IV SOLN
8.0000 mg/h | INTRAVENOUS | Status: AC
  Administered 2020-06-07 – 2020-06-10 (×7): 8 mg/h via INTRAVENOUS
  Filled 2020-06-07 (×7): qty 80

## 2020-06-07 MED ORDER — SODIUM CHLORIDE 0.9 % IV SOLN
10.0000 mL/h | Freq: Once | INTRAVENOUS | Status: AC
  Administered 2020-06-07: 10 mL/h via INTRAVENOUS

## 2020-06-07 MED ORDER — ACETAMINOPHEN 325 MG PO TABS
650.0000 mg | ORAL_TABLET | ORAL | Status: DC | PRN
  Administered 2020-06-08 – 2020-06-19 (×15): 650 mg via ORAL
  Filled 2020-06-07 (×16): qty 2

## 2020-06-07 MED ORDER — PANTOPRAZOLE SODIUM 40 MG IV SOLR
40.0000 mg | Freq: Two times a day (BID) | INTRAVENOUS | Status: DC
  Administered 2020-06-11 – 2020-06-13 (×4): 40 mg via INTRAVENOUS
  Filled 2020-06-07 (×4): qty 40

## 2020-06-07 NOTE — Progress Notes (Signed)
Pt arrived on unit at this time. Pt is alert and oriented X4. Pt on 3L [place] which is what she wears at home also. PRBCs infusing. Breathing normal. VSS at this time.

## 2020-06-07 NOTE — Consult Note (Signed)
GI Inpatient Consult Note  Reason for Consult: Acute blood loss anemia, melena   Attending Requesting Consult: Dr. Flora Lipps, MD  History of Present Illness: Betty [name] is a 74 y.o. female seen for evaluation of acute blood loss anemia, melena at the request of Dr. Mortimer Fries. Pt has a PMH of atrial fibrillation on chronic anticoagulation with Warfarin, COPD, HTN, Hx of interstitial lung disease, ANCA associated vasculitis, Hx of DVT, Hx of mitral valve replacement, and hx of diabetes. Patient presented to the ED this afternoon for chief complaint of black stools and generalized weakness and epistaxis. 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 She was admitted at [facility] 03/03 - 03/13/20 for acute on chronic diastolic heart failure and acute blood loss anemia where EGD revealed stomach AVMs which were treated with clips and APC. She also had hospitalization in January 2021 for UGIB in setting of supratherapeutic INR where she had gastric AVMs s/p APC treatment. Patient reports over the past three days she has noticed some postprandial urgency to have a BM and darker stools. She reports she started noticing dark, tarry stools this morning and this concerned her. Upon presentation to the ED, she had hemoglobin 6.1 down from 12.0 six weeks ago. She had DRE in ED showing dark black stool which was guaiac positive. She denies any nausea, vomiting, dysphagia, odynophagia, or abdominal pain. She denies dizziness, chest pain, shortness of breath, or numbness or tingling. She was hypotensive in the ED 80/49 and found to have supratherapeutic INR of 4.6. Dr. Ubaldo Glassing in Cardiology has been consulted and patient is admitted to the ICU. She had WBC 18.4 and lactic acid 6.7. She was started on IV Rocephin and cultures ordered. Patient is wearing 3L O2 via [place] and is able to answer all questions. She reports her son is her HPOA.    Last Colonoscopy:  05/11/14 - two polyps in the ascending colon,  one polyp in the transverse colon, diverticulosis and internal hemorrhoids. Pathology - tubular adenomas   Last Endoscopy:  03/09/2020 (Dr. Egbert Garibaldi at [facility]) - Parakeratosis with linear erosions in the esophagus,  as seen previously. No active bleeding. - A single non-bleeding angioectasia in the stomach.  Treated with argon plasma coagulation (APC). Clip (MR  conditional) was placed. - Normal examined duodenum. - No specimens collected. 03/07/2020 (Dr. Egbert Garibaldi at [facility]) - Parakeratosis with linear erosions mucosa in the  esophagus. This could be the source of bleeding but  gastric bleeding source cannot be excluded. - Clotted blood in the gastric fundus that was unable  to be removed. Obscured underlying mucosa. - Erythematous mucosa in the antrum. - Blood in the duodenal bulb but no sign of duodenal  bleeding. - No specimens collected. 01/17/2020 (Dr. Eliberto Ivory at [facility]) - Normal esophagus. - Two recently bleeding angioectasias in the stomach. This  is the source of bleeding. Treated with argon plasma  coagulation (APC). - Normal examined duodenum. - No specimens collected.   Past Medical History:  Past Medical History:  Diagnosis Date  . Acute diastolic heart failure ([place])   . Allergy   . ANCA-associated vasculitis ([place])   . Asthma   . Atrial fibrillation ([place])   . Backache, unspecified   . Cancer ([place])    skin  . Cardiomegaly   . COPD (chronic obstructive pulmonary disease) ([place])   . Diabetes mellitus without complication ([place])   . 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  Esophageal reflux   . Headache(784.0)   . Herpes zoster without mention of complication   . Hx: UTI (urinary tract infection)   . Hypertension    heart controlled w CHF  . Nontoxic uninodular goiter   . Obesity, unspecified   . Osteoarthrosis, unspecified whether generalized or localized, unspecified site   . Unspecified sleep apnea   . Urine incontinence    hx of     Problem List: Patient Active Problem List   Diagnosis Date Noted  . Shock ([place]) 06/07/2020  . Gait instability 08/06/2019  . Severe sepsis ([place]) 12/25/2018  . Abdominal pain   . Goals of care, counseling/discussion   . Palliative care by specialist   . Respiratory failure, acute ([place]) 02/26/2018  . COPD (chronic obstructive pulmonary disease) ([place]) 02/26/2018  . C2 cervical fracture ([place]) 02/19/2018  . Hematuria 01/27/2018  . Carotid stenosis 12/23/2017  . Varicose veins of both lower extremities with pain 11/17/2017  . Acute on chronic diastolic CHF (congestive heart failure) ([place]) 08/13/2017  . Leg pain 07/14/2017  . Chronic venous insufficiency 07/14/2017  . PAD (peripheral artery disease) ([place]) 07/14/2017  . Postprocedural hemorrhage due to complication of oral surgery 03/27/2017  . Acute blood loss anemia 03/27/2017  . H/O mitral valve replacement with mechanical valve 03/27/2017  . Atypical atrial flutter ([place]) 01/23/2017  . Long term current use of anticoagulants with INR goal of 2.5-3.5 09/18/2016  . Syncope 06/25/2016  . UTI (urinary tract infection) 06/25/2016  . Afib ([place]) 05/10/2016  . Dyspnea 05/10/2016  . Chronic diastolic CHF (congestive heart failure) ([place]) 05/10/2016  . Anemia 05/10/2016  . Anemia 05/10/2016  . Other specified abnormal immunological findings in serum 04/24/2016  . Sepsis ([place]) 04/01/2016  . Prolonged Q-T interval on ECG 03/13/2016  . Mitral stenosis 03/13/2016  . Interstitial lung disease ([place]) 12/29/2015  . Chronic LBP 07/26/2015  . Essential (primary) hypertension 07/26/2015  . Idiopathic insomnia 07/26/2015  . Restless leg 07/26/2015  . Obesity (BMI 30-39.9) 01/07/2015  . Abnormal EKG 09/11/2014  . External nasal lesion 02/26/2014  . Abnormal liver function tests 01/28/2014  . Ankle pain, right 01/28/2014  . Arthralgia of ankle or foot 01/28/2014  . Urinary frequency 12/24/2013  . Hemorrhoids 12/24/2013  . Neck pain on right side  11/10/2013  . Vitamin D deficiency 09/09/2013  . Urinary incontinence 05/30/2013  . History of colonic polyps 05/30/2013  . Controlled type 2 diabetes mellitus without complication ([place]) 69/79/4801  . H/O deep venous thrombosis 05/05/2013  . History of pulmonary embolism 05/05/2013  . ANCA-associated vasculitis ([place]) 05/05/2013  . OSA (obstructive sleep apnea) 05/05/2013  . Depression 04/13/2009  . THYROID NODULE 04/13/2009  . ANCA-associated vasculitis ([place]) 04/13/2009  . GERD 04/13/2009  . OSTEOARTHRITIS 04/13/2009  . BACK PAIN 04/13/2009  . HEADACHE 04/13/2009  . Gastro-esophageal reflux disease without esophagitis 04/13/2009  . Mild episode of recurrent major depressive disorder ([place]) 04/13/2009  . Benign hypertensive heart disease with heart failure ([place]) 03/28/2009  . PAF (paroxysmal atrial fibrillation) ([place]) 03/28/2009  . VENTRICULAR HYPERTROPHY, LEFT 03/28/2009  . Hypertensive cardiomyopathy, with heart failure ([place]) 03/28/2009    Past Surgical History: Past Surgical History:  Procedure Laterality Date  . ABDOMINAL HYSTERECTOMY  1979   complete (for precancerous cells)  . ABLATION  2011 & 2014  . bladder botox  01/26/2018  . CHOLECYSTECTOMY    . CYSTOSCOPY WITH FULGERATION N/A 01/28/2018   Procedure: [place] AND CLOT EVACUATION;  Surgeon: Hollice Espy, MD;  Location: [facility] ORS;  Service: Urology;  Laterality: N/A;  . Interstim Placement  2012  . OOPHORECTOMY      Allergies: Allergies  Allergen Reactions  . Flecainide Shortness Of Breath and Other (See Comments)    Reaction: dizziness   . Amiodarone Other (See Comments)    Pt states that this medication causes lung bleeding.      Home Medications: (Not in a hospital admission)  Home medication reconciliation was completed with the patient.   Scheduled Inpatient Medications:   . sodium chloride flush  3 mL Intravenous Q12H    Continuous Inpatient Infusions:   . sodium chloride    .  famotidine (PEPCID) IV 20 mg (06/07/20 1802)    PRN Inpatient Medications:  sodium chloride, acetaminophen, docusate sodium, ondansetron (ZOFRAN) IV, polyethylene glycol, sodium chloride flush  Family History: family history includes Arthritis in her father, maternal grandmother, and mother; Cancer in her maternal grandmother; Colon cancer in her mother; Coronary artery disease in her brother; Heart attack in her father; Heart failure in her father; Hypertension in her father and mother; Peripheral vascular disease in her brother; Stroke in her father.  The patient's family history is negative for inflammatory bowel disorders, GI malignancy, or solid organ transplantation.  Social History:   reports that she has quit smoking. Her smoking use included cigarettes. She has a 7.50 pack-year smoking history. She has never used smokeless tobacco. She reports that she does not drink alcohol and does not use drugs. The patient denies ETOH, tobacco, or drug use.   Review of Systems: Constitutional: Weight is stable.  Eyes: No changes in vision. ENT: No oral lesions, sore throat.  GI: see HPI.  Heme/Lymph: No easy bruising.  CV: No chest pain.  GU: No hematuria.  Integumentary: No rashes.  Neuro: No headaches.  Psych: No depression/anxiety.  Endocrine: No heat/cold intolerance.  Allergic/Immunologic: No urticaria.  Resp: No cough, SOB.  Musculoskeletal: No joint swelling.    Physical Examination: BP (!) 101/44   Pulse 73   Temp (!) 97.5 F (36.4 C) (Oral)   Resp 16   Ht 5\' 9"  (1.753 m)   Wt 90 kg   SpO2 95%   BMI 29.30 kg/m  Gen: NAD, alert and oriented x 4 HEENT: PEERLA, EOMI, Neck: supple, no JVD or thyromegaly Chest: CTA bilaterally, no wheezes, crackles, or other adventitious sounds CV: RRR, no m/g/c/r Abd: soft, NT, ND, +BS in all four quadrants; no HSM, guarding, ridigity, or rebound tenderness Ext: no edema, well perfused with 2+ pulses, Skin: no rash or lesions  noted Lymph: no LAD  Data: Lab Results  Component Value Date   WBC 18.4 (H) 06/07/2020   HGB 6.1 (L) 06/07/2020   HCT 18.6 (L) 06/07/2020   MCV 94.4 06/07/2020   PLT 325 06/07/2020   Recent Labs  Lab 06/07/20 1242  HGB 6.1*   Lab Results  Component Value Date   NA 126 (L) 06/07/2020   K 4.8 06/07/2020   CL 79 (L) 06/07/2020   CO2 26 06/07/2020   BUN 180 (H) 06/07/2020   CREATININE 2.67 (H) 06/07/2020   Lab Results  Component Value Date   ALT 17 06/07/2020   AST 34 06/07/2020   ALKPHOS 79 06/07/2020   BILITOT 1.1 06/07/2020   Recent Labs  Lab 06/07/20 1242  APTT 42*  INR 4.6*   Assessment/Plan:  74 y/o Caucasian female with a PMH of atrial fibrillation on chronic anticoagulation with Warfarin, COPD, HTN, Hx of interstitial  lung disease, ANCA associated vasculitis, Hx of DVT, Hx of mitral valve replacement, and hx of diabetes admitted for acute blood loss anemia  1. 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 Hemoglobin dropped 12.0 to 6.1 in a 6 week span, consistent with GIB.  2. Heme positive stool  3. 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 Supratherapeutic INR - INR 4.6 on admission. INR goal 2.5-3.5.   5. Sepsis - in the ICU  Recommendations:  1. Agree with transfusions as necessary to ensure Hemoglobin >7.0. 2. Agree with IV acid suppression 3. She appears hemodynamically stable at present with no signs of active GIB 4. Advise EGD when clinically feasible. Ideally when her INR is in more acceptable range. Appreciate cardiology recs.  5. Following along with you   If signs of active GI bleeding, please call Dr. Alice Reichert immediately.   Thank you for the consult. Please call with questions or concerns.  Reeves Forth [facility] [phone] (Cell)

## 2020-06-07 NOTE — Progress Notes (Signed)
CRITICAL VALUE ALERT  Critical Value:  Lactic acid 6.7  Date & Time Notified: 06/07/20 1840 hrs  Provider Notified: Dr. Mortimer Fries  Orders Received/Actions taken: None at this time

## 2020-06-07 NOTE — Consult Note (Signed)
Cardiology Consultation Note    Patient ID: Betty [name], MRN: [id number], DOB/AGE: July 03, 1946 74 y.o. Admit date: 06/07/2020   Date of Consult: 06/07/2020 Primary Physician: Dion Body, MD Primary Cardiologist: Dr.  Loanne Drilling, [facility]  Chief Complaint: blacck stools Reason for Consultation: gi bleed/mechanical mitral valve Requesting MD: Dr. Jacqualine Code  HPI: Betty [name] is a 74 y.o. female with history of heart failure with preserved ejection fraction with ejection fraction of 55%, interstitial lung disease, paroxysmal atrial fibrillation, history of recurrent major depression disorder, history of diabetes, ANCA associated vasculitis, history of pulmonary embolus history of mechanical mitral valve replacements on chronic anticoagulation with warfarin with an INR goal between 2.5 and 3.5, cardiac resynchronization therapy device in place, pulmonary hypertension, stage IV chronic kidney disease, history of sleep apnea on BiPAP seen at her cardiologist office syndrome yesterday for an office visit who presented to the emergency room with dark stools after calling her cardiologist who told her to come to our ER.  Her proBNP was 2462 yesterday... Blood draw yesterday showed a BUN of 160 with a creatinine of 2.1.  Patient's INR today is 4.6 with a hemoglobin of 6.1.  She states her stools have been somewhat dark over the past several days to weeks.  She denies any frank red blood.  She is relatively hypotensive at 80/49 with a heart rate of 75.  She is on 3 L of oxygen per nasal cannula with pulse ox of 100%.  She is currently mentating well.  She has no chest pain.  She does have some weakness and fatigue.  She had a GI bleeding problem several months ago and is treated with allowing her INR to drift down.  She is compliant with her medications.  Past Medical History:  Diagnosis Date  . Acute diastolic heart failure ([place])   . Allergy   . ANCA-associated vasculitis ([place])   . Asthma   . Atrial  fibrillation ([place])   . Backache, unspecified   . Cancer ([place])    skin  . Cardiomegaly   . COPD (chronic obstructive pulmonary disease) ([place])   . Diabetes mellitus without complication ([place])   . 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 Esophageal reflux   . Headache(784.0)   . Herpes zoster without mention of complication   . Hx: UTI (urinary tract infection)   . Hypertension    heart controlled w CHF  . Nontoxic uninodular goiter   . Obesity, unspecified   . Osteoarthrosis, unspecified whether generalized or localized, unspecified site   . Unspecified sleep apnea   . Urine incontinence    hx of      Surgical History:  Past Surgical History:  Procedure Laterality Date  . ABDOMINAL HYSTERECTOMY  1979   complete (for precancerous cells)  . ABLATION  2011 & 2014  . bladder botox  01/26/2018  . CHOLECYSTECTOMY    . CYSTOSCOPY WITH FULGERATION N/A 01/28/2018   Procedure: Superior AND CLOT EVACUATION;  Surgeon: Hollice Espy, MD;  Location: [facility] ORS;  Service: Urology;  Laterality: N/A;  . Interstim Placement  2012  . OOPHORECTOMY       Home Meds: Prior to Admission medications   Medication Sig Start Date End Date Taking? Authorizing Provider  atorvastatin (LIPITOR) 20 MG tablet Take 20 mg by mouth at bedtime.  01/25/19   [provider]  azaTHIOprine (IMURAN) 50 MG tablet Take 1 tablet by mouth 2 (two) times daily.  06/18/19   [provider]  ferrous sulfate 325 (65 FE) MG EC tablet Take 325 mg by mouth daily.  06/23/17   [provider]  gabapentin (NEURONTIN) 300 MG capsule Take 600 mg by mouth 3 (three) times daily. Take 2 capsules (600mg ) in morning, 1 capsule (300mg ) at midday, and 2 capsules (600mg ) at bedtime    [provider]  glipiZIDE (GLUCOTROL XL) 10 MG 24 hr tablet Take 1 tablet by mouth daily. 07/10/19   [provider]  levocetirizine (XYZAL) 5 MG tablet Take 5 mg by mouth at  bedtime. 10/15/18   [provider]  metFORMIN (GLUCOPHAGE) 500 MG tablet Take 1 tablet by mouth 2 (two) times daily. 03/08/19   [provider]  metolazone (ZAROXOLYN) 2.5 MG tablet Take 2.5 mg by mouth as needed (fluid retention). Take once weekly as needed 02/16/19   [provider]  metoprolol succinate (TOPROL-XL) 50 MG 24 hr tablet Take 50 mg by mouth daily. 02/22/19   [provider]  pantoprazole (PROTONIX) 40 MG tablet Take 40 mg by mouth daily. 10/15/18   [provider]  potassium chloride (MICRO-K) 10 MEQ CR capsule Take 20 mEq by mouth 2 (two) times daily. 10/26/18   [provider]  rOPINIRole (REQUIP) 2 MG tablet Take 0.5 tablets (1 mg total) by mouth at bedtime. 02/27/19   Paulette Blanch, MD  spironolactone (ALDACTONE) 50 MG tablet Take 75 mg by mouth daily.  01/13/19   [provider]  tiZANidine (ZANAFLEX) 2 MG tablet Take 2 mg by mouth daily as needed.  02/23/19   [provider]  torsemide (DEMADEX) 20 MG tablet Take 100 mg by mouth 2 (two) times daily.  02/01/19   [provider]  traZODone (DESYREL) 50 MG tablet Take 50 mg by mouth at bedtime.  10/26/18   [provider]  venlafaxine XR (EFFEXOR-XR) 150 MG 24 hr capsule Take 150 mg by mouth daily. 10/02/18   [provider]  Vitamin D, Ergocalciferol, (DRISDOL) 50000 units CAPS capsule Take 50,000 Units by mouth every Tuesday.  10/21/18   [provider]  warfarin (COUMADIN) 1 MG tablet Take 8 mg by mouth 2 (two) times a week. Take 8mg  Tuesday and Thursday    [provider]  warfarin (COUMADIN) 5 MG tablet Take 7 mg by mouth daily at 8 pm. Take 7MG  on Monday,Wednesday,Friday,Saturday, and Sunday    [provider]    Inpatient Medications:  . pantoprazole (PROTONIX) IV  40 mg Intravenous Once   . sodium chloride    . sodium chloride      Allergies:  Allergies  Allergen Reactions  . Flecainide Shortness Of  Breath and Other (See Comments)    Reaction: dizziness   . Amiodarone Other (See Comments)    Pt states that this medication causes lung bleeding.      Social History   Socioeconomic History  . Marital status: Widowed    Spouse name: Not on file  . Number of children: 1  . Years of education: Not on file  . Highest education level: Not on file  Occupational History    Employer: [employer]  Tobacco Use  . Smoking status: Former Smoker    Packs/day: 0.50    Years: 15.00    Pack years: 7.50    Types: Cigarettes  . Smokeless tobacco: Never Used  . Tobacco comment: Has a 20-pack-year history, qutting in 1999  Vaping Use  . Vaping  Use: Never used  Substance and Sexual Activity  . Alcohol use: No    Alcohol/week: 0.0 standard drinks  . Drug use: No  . Sexual activity: Not Currently  Other Topics Concern  . Not on file  Social History Narrative   Lives in [place] with her husband. 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 Does not routinely exercise.    Social Determinants of Health   Financial Resource Strain:   . Difficulty of Paying Living Expenses:   Food Insecurity:   . Worried About Charity fundraiser in the Last Year:   . Arboriculturist in the Last Year:   Transportation Needs:   . Film/video editor (Medical):   Marland Kitchen Lack of Transportation (Non-Medical):   Physical Activity:   . Days of Exercise per Week:   . Minutes of Exercise per Session:   Stress:   . Feeling of Stress :   Social Connections:   . Frequency of Communication with Friends and Family:   . Frequency of Social Gatherings with Friends and Family:   . Attends Religious Services:   . Active Member of Clubs or Organizations:   . Attends Archivist Meetings:   Marland Kitchen Marital Status:   Intimate Partner Violence:   . Fear of Current or Ex-Partner:   . Emotionally Abused:   Marland Kitchen Physically Abused:   . Sexually Abused:      Family  History  Problem Relation Age of Onset  . Heart attack Father   . Heart failure Father   . Arthritis Father   . Stroke Father   . Hypertension Father   . Coronary artery disease Brother   . Peripheral vascular disease Brother   . Arthritis Mother   . Colon cancer Mother        colon cancer  . Hypertension Mother   . Cancer Maternal Grandmother        colon cancer  . Arthritis Maternal Grandmother      Review of Systems: A 12-system review of systems was performed and is negative except as noted in the HPI.  Labs: No results for input(s): CKTOTAL, CKMB, TROPONINI in the last 72 hours. Lab Results  Component Value Date   WBC 18.4 (H) 06/07/2020   HGB 6.1 (L) 06/07/2020   HCT 18.6 (L) 06/07/2020   MCV 94.4 06/07/2020   PLT 325 06/07/2020   No results for input(s): NA, K, CL, CO2, BUN, CREATININE, CALCIUM, PROT, BILITOT, ALKPHOS, ALT, AST, GLUCOSE in the last 168 hours.  Invalid input(s): LABALBU Lab Results  Component Value Date   CHOL 153 03/07/2015   HDL 36.60 (L) 03/07/2015   LDLCALC 90 03/07/2015   TRIG 82 02/28/2018   No results found for: DDIMER  Radiology/Studies:  No results found.  Wt Readings from Last 3 Encounters:  06/07/20 90 kg  11/22/19 104.3 kg  10/12/19 102.1 kg    EKG: Ectopic atrial rhythm.  Right bundle branch block.  No ischemia.  Physical Exam: Pleasant Caucasian female in no acute distress Blood pressure (!) 80/49, pulse 69, temperature (S) (!) 96.5 F (35.8 C), temperature source (S) Rectal, resp. rate 20, height 5\' 9"  (1.753 m), weight 90 kg, SpO2 100 %. Body mass index is 29.3 kg/m. General: Well developed, well nourished, in no acute distress. Head: Normocephalic, atraumatic, sclera non-icteric, no xanthomas, nares are without discharge.  Neck: Negative for carotid bruits. JVD not elevated. Lungs: Clear bilaterally to auscultation  without wheezes, rales, or rhonchi. Breathing is unlabored. Heart: RRR with S1 S2. No murmurs, rubs, or  gallops appreciated. Abdomen: Soft, non-tender, non-distended with normoactive bowel sounds. No hepatomegaly. No rebound/guarding. No obvious abdominal masses. Msk:  Strength and tone appear normal for age. Extremities: No clubbing or cyanosis. No edema.  Distal pedal pulses are 2+ and equal bilaterally. Neuro: Alert and oriented X 3. No facial asymmetry. No focal deficit. Moves all extremities spontaneously. Psych:  Responds to questions appropriately with a normal affect.     Assessment and Plan  74 year old female with multiple medical problems including mitral valve disease status post mechanical mitral valve replacement on warfarin with an INR goal between 2.5 and 3.5, history of HFpEF with an ejection fraction of 55% by echo in April of this year, history of interstitial lung disease, ANCA associated vasculitis, paroxysmal atrial fibrillation, history of DVT who presented to the ER with dark stools and an elevated BUN.  Hemoglobin is 6.1.  INR is 4.7.  She is relatively hypotensive but asymptomatic from this.  She denies syncope or presyncope.  Her pulse oximetry is 100% on 3 L per nasal cannula.  1.  Anemia-likely secondary to GI bleed due to supratherapeutic INR.  Has a mechanical prosthetic mitral valve.  INR goal is between 2.5 and 3.5.  Patient reports taking the medication as prescribed however her level is increased.  She appears hemodynamically stable although somewhat hypotensive.  She has no clinical evidence of heart failure.  Would not use vitamin K at all.  Should she become unstable, consideration for FFP could be raised however would attempt to allow her INR to drift down naturally.  Agree with transfusion of 2 units.  Careful hydration although the patient has preserved LV function.  Consideration for GI evaluation and treatment as needed.  Agree with Protonix.  2.  Mitral valve disease-status post mechanical MVR.  She appears euvolemic today.  Her JVP is not elevated.  She was on  high-dose torsemide, beta-blocker and spironolactone.  Would continue to have an INR goal of 2.5-3.5.  We would allow the INR to drift down naturally if possible.  Would hold her diuretics temporarily as she volume resuscitated.  Low-sodium diet is recommended.  3.  Hypertensive-hypotensive at present due to GI bleeding.  Will follow and resume her outpatient medications which were atorvastatin 20 mg daily, Bumex 2 mg 3 tablets twice daily, ferrous sulfate 325 daily, metolazone 2.5 mg Monday Wednesday and Friday 30 minutes prior to the Bumex, metoprolol succinate 25 mg daily, pantoprazole 40 mg daily  4.  Paroxysmal atrial fibrillation-status post biventricular pacemaker.  Appears to be in sinus rhythm at present.  5.  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   Signed, Teodoro Spray MD 06/07/2020, 1:53 PM Pager: [phone]

## 2020-06-07 NOTE — H&P (Addendum)
Name: Betty [name] MRN: [id number] DOB: Jun 08, 1946     CONSULTATION DATE: 06/07/2020  REFERRING MD : Jacqualine Code  CHIEF COMPLAINT: GIB  HISTORY OF PRESENT ILLNESS:   74 y.o. female with history of heart failure with preserved ejection fraction with ejection fraction of 55%, +interstitial lung disease, +paroxysmal atrial fibrillation, history of recurrent major depression disorder, history of diabetes, +ANCA associated vasculitis, history of pulmonary embolus, history of mechanical mitral valve replacements on chronic anticoagulation with warfarin with an INR goal between 2.5 and 3.5,  cardiac resynchronization therapy device in place, pulmonary hypertension, stage IV chronic kidney disease, history of sleep apnea on BiPAP.  She was seen at her cardiologist office syndrome yesterday for an office visit. Found to have a proBNP 2642, BUN 160, and Creatinine 2.1 at that time.   She presents to the [facility] ED today 06/07/20 with dark stools after calling her cardiologist who told her to come to our ER. She reports her stools have been "dark" for several days to a week.  She reported associated weakness and fatigue.  She denies chest pain, Shortness of breath, hematemesis, hematochezia, nausea, vomiting, abdominal pain.  Upon presentation to the ED she was hypotensive with BP 80/49, pulse 75. Her stool was guaiac positive. Initial workup in ED revealed INR 4.6, hemoglobin of 6.1, WBC 18.4, procalcitonin 0.37, high sensitivity troponin 30, BUN 180, Creatinine 2.67, lactic acid 6.7, anion gap 21.  Urinalysis is consistent with UTI.  Her COVID-19 PCR is negative.  PCCM is asked to admit the pt to ICU for further workup and treatment of Acute Blood Loss Anemia due to GIB with super therapuetic INR from Warfarin Therapy (therapy for Mechanical Valve) associated with severe acute renal failure, along with UTI.  Cardiology and GI have been consulted.   PAST MEDICAL HISTORY :   has a past medical history of Acute  diastolic heart failure ([place]), Allergy, ANCA-associated vasculitis ([place]), Asthma, Atrial fibrillation ([place]), Backache, unspecified, Cancer ([place]), Cardiomegaly, COPD (chronic obstructive pulmonary disease) ([place]), Diabetes mellitus without complication ([place]), Diffuse pulmonary alveolar hemorrhage, Esophageal reflux, Headache(784.0), Herpes zoster without mention of complication, UTI (urinary tract infection), Hypertension, Nontoxic uninodular goiter, Obesity, unspecified, Osteoarthrosis, unspecified whether generalized or localized, unspecified site, Unspecified sleep apnea, and Urine incontinence.  has a past surgical history that includes Oophorectomy; Cholecystectomy; Abdominal hysterectomy (1979); Ablation (2011 & 2014); Interstim Placement (2012); bladder botox (01/26/2018); and Cystoscopy with fulgeration (N/A, 01/28/2018). Prior to Admission medications   Medication Sig Start Date End Date Taking? Authorizing Provider  atorvastatin (LIPITOR) 20 MG tablet Take 20 mg by mouth at bedtime.  01/25/19  Yes [provider]  azaTHIOprine (IMURAN) 50 MG tablet Take 2 tablets by mouth daily.  06/18/19  Yes [provider]  bumetanide (BUMEX) 2 MG tablet TAKE 3 TABLETS EVERY MORNING, 2 TABLETS EVERY AFTERNOON MAY TAKE 1 EXTRA TABLET DAILY AS NEEDED FOR VOLUME OVERLOAD 05/17/20  Yes [provider]  cyclobenzaprine (FLEXERIL) 10 MG tablet Take 10 mg by mouth 3 (three) times daily as needed. 06/05/20  Yes [provider]  ferrous sulfate 325 (65 FE) MG EC tablet Take 325 mg by mouth daily.  06/23/17  Yes [provider]  glipiZIDE (GLUCOTROL XL) 5 MG 24 hr tablet Take 1 tablet by mouth daily.  07/10/19  Yes [provider]  Lactobacillus-Inulin (CULTURELLE ADULT ULT BALANCE PO) Take by mouth.   Yes [provider]  levocetirizine (XYZAL) 5 MG tablet Take 5 mg by mouth at bedtime. 10/15/18  Yes [provider]  magnesium oxide (MAG-OX) 400 MG tablet  Take 1 tablet by mouth daily. 05/23/20  Yes [provider]  metFORMIN (GLUCOPHAGE) 1000 MG tablet Take 1 tablet by mouth 2 (two) times daily.  03/08/19  Yes [provider]  metolazone (ZAROXOLYN) 2.5 MG tablet as needed (fluid retention). :TAKE 1 TABLET BY MOUTH EVERY MONDAY, WEDNESDAY AND FRIDAY 30 MINUTES PRIOR TO MORNING BUMEX 02/16/19  Yes [provider]  metoprolol succinate (TOPROL-XL) 25 MG 24 hr tablet Take 25 mg by mouth daily. TAKE 1 TABLET BY MOUTH DAILY   Yes [provider]  pantoprazole (PROTONIX) 40 MG tablet Take 40 mg by mouth daily. 10/15/18  Yes [provider]  pregabalin (LYRICA) 75 MG capsule Take 75 mg by mouth daily. 05/11/20  Yes [provider]  rOPINIRole (REQUIP) 1 MG tablet Take 3 mg by mouth. TAKE THREE TABLETS 3 TIMES DAILY   Yes [provider]  senna-docusate (SENOKOT-S) 8.6-50 MG tablet Take 1 tablet by mouth 2 (two) times daily.    Yes [provider]  spironolactone (ALDACTONE) 50 MG tablet Take 50 mg by mouth daily.  01/13/19  Yes [provider]  traZODone (DESYREL) 50 MG tablet Take 50 mg by mouth at bedtime.  10/26/18  Yes [provider]  venlafaxine XR (EFFEXOR-XR) 150 MG 24 hr capsule daily. TAKE 1 CAPSULE BY MOUTH ONCE DAILY TAKE WITH 37.5MG  TO EQUAL 187.5MG  DAILY 10/02/18  Yes [provider]  venlafaxine XR (EFFEXOR-XR) 37.5 MG 24 hr capsule TAKE 1 CAPSULE BY MOUTH ONCE DAILY TAKE WITH 150MG  TO EQUAL 187.5MG  DAILY   Yes [provider]  warfarin (COUMADIN) 3 MG tablet daily. TAKE 1 TABLET NIGHTLY USE IN COMBINATION WITH 4MG  TABLET FOR TOTAL OF 7MG  EVERY MONDAY,TUESDAY,THURSDAY  and  FRIDAY   Yes [provider]  warfarin (COUMADIN) 4 MG tablet daily. TAKE 1 TABLET NIGHTLY USE IN COMBINATION WITH 5MG  TABLET FOR TOTAL OF 7MG  EVERY MONDAY,TUESDAY,THURSDAY  and  Friday) TAKE 2 TABLETS WEDNESDAY, SATURDAY, Sunday   Yes [provider]    Allergies  Allergen Reactions  . Flecainide Shortness Of Breath and Other (See Comments)    Reaction: dizziness   . Amiodarone Other (See Comments)    Pt states that this medication causes lung bleeding.      FAMILY HISTORY:  family history includes Arthritis in her father, maternal grandmother, and mother; Cancer in her maternal grandmother; Colon cancer in her mother; Coronary artery disease in her brother; Heart attack in her father; Heart failure in her father; Hypertension in her father and mother; Peripheral vascular disease in her brother; Stroke in her father. SOCIAL HISTORY:  reports that she has quit smoking. Her smoking use included cigarettes. She has a 7.50 pack-year smoking history. She has never used smokeless tobacco. She reports that she does not drink alcohol and does not use drugs.   Review of Systems: Positives in BOLD  Gen:  Denies  fever, sweats, chills weight loss  HEENT: Denies blurred vision, double vision, ear pain, eye pain, hearing loss, nose bleeds, sore throat Cardiac:  No +dizziness, chest pain or heaviness, chest tightness,edema, No JVD Resp:   No cough, -sputum production, -shortness of breath,-wheezing, -hemoptysis,  Gi: Denies swallowing difficulty, stomach pain, nausea or vomiting, diarrhea, constipation, bowel incontinence Gu:  Denies bladder incontinence, +burning urine Ext:   Denies Joint pain, stiffness or swelling Skin: Denies  skin rash, easy bruising or bleeding or hives Endoc:  Denies polyuria,  polydipsia , polyphagia or weight change Psych:   Denies depression, insomnia or hallucinations  Other:  All other systems negative   Estimated body mass index is 29.3 kg/m as calculated from the following:   Height as of this encounter: 5\' 9"  (1.753 m).   Weight as of this encounter: 90 kg.    VITAL SIGNS: Temp:  [96.5 F (35.8 C)-97.8 F (36.6 C)] 97.8 F (36.6 C) (06/16 1747) Pulse Rate:  [66-70] 70 (06/16 1747) Resp:  [14-20] 17  (06/16 1747) BP: (80-116)/(44-86) 99/53 (06/16 1747) SpO2:  [94 %-100 %] 94 % (06/16 1747) Weight:  [90 kg] 90 kg (06/16 1248)   No intake/output data recorded. Total I/O In: 370 [Blood:370] Out: -    SpO2: 94 % O2 Flow Rate (L/min): 3 L/min   Physical Examination:   General Appearance: Acute on chronically ill appearing female, sitting in bed, on chronic 3L [place], in No acute distress  Neuro:Atraumatic, normocephalic, neck supple,no JVD,  speech normal,  HEENT: PERRLA, EOM intact.   Pulmonary: normal breath sounds, No wheezing.  CardiovascularNormal S1,S2.  No m/r/g.   Abdomen: Benign, Soft, non-tender, nondistended, no guarding or rebound tenderness Renal:  No costovertebral tenderness  GU:  Not performed at this time. Endoc: No evident thyromegaly Skin:   warm, no rashes, no ecchymosis  Extremities: normal, no cyanosis, clubbing. PSYCHIATRIC: Mood, affect within normal limits.   ALL OTHER ROS ARE NEGATIVE   MEDICATIONS: I have reviewed all medications and confirmed regimen as documented   CULTURE RESULTS   Recent Results (from the past 240 hour(s))  SARS Coronavirus 2 by RT PCR (hospital order, performed in [facility] hospital lab) Nasopharyngeal Nasopharyngeal Swab     Status: None   Collection Time: 06/07/20  1:34 PM   Specimen: Nasopharyngeal Swab  Result Value Ref Range Status   SARS Coronavirus 2 NEGATIVE NEGATIVE Final    Comment: (NOTE) SARS-CoV-2 target nucleic acids are NOT DETECTED.  The SARS-CoV-2 RNA is generally detectable in upper and lower respiratory specimens during the acute phase of infection. The lowest concentration of SARS-CoV-2 viral copies this assay can detect is 250 copies / mL. A negative result does not preclude SARS-CoV-2 infection and should not be used as the sole basis for treatment or other patient management decisions.  A negative result may occur with improper specimen collection / handling, submission of specimen other than  nasopharyngeal swab, presence of viral mutation(s) within the areas targeted by this assay, and inadequate number of viral copies (<250 copies / mL). A negative result must be combined with clinical observations, patient history, and epidemiological information.  Fact Sheet for Patients:   StrictlyIdeas.no  Fact Sheet for Healthcare Providers: BankingDealers.co.za  This test is not yet approved or  cleared by the [place] FDA and has been authorized for detection and/or diagnosis of SARS-CoV-2 by FDA under an Emergency Use Authorization (EUA).  This EUA will remain in effect (meaning this test can be used) for the duration of the COVID-19 declaration under Section 564(b)(1) of the Act, 21 U.S.C. section 360bbb-3(b)(1), unless the authorization is terminated or revoked sooner.  Performed at [facility], [address], [place], [place] [address]            SYNOPSIS 74 yo WF with Acute Blood Loss Anemia due to GIB with super therapuetic INR from Warfarin Therapy (therapy for Mechanical Valve) associated with  severe acute renal failure, along with UTI.   ASSESSMENT AND PLAN  ACUTE Blood Loss  Anemia -Monitor for S/Sx of bleeding -Trend CBC (follow H&H q6h) -SCD's for VTE Prophylaxis (no chemical prophylaxis due to bleed)  -Transfuse for Hgb <8  Acute GI Bleed in setting of Supratherapeutic INR -NPO -Protonix gtt -GI consulted, appreciate input ~ plan for EGD once INR improved -Trend INR ~ INR Goal 2.5-3.5 -Cardiology recommends avoiding vitamin K and allowing INR to drift down naturally; if becomes unstable consider FFP  Hypotension in setting of Hemorrhagic shock +/- Septic Shock Hx: Mechanical Valve on Warfarin, Paroxysmal A-fib, CHF, HTN -Continuous cardiac monitoring -Maintain MAP >65 -Cautious IV Fluids (given CHF hx) -Transfusions as indicated -Vasopressors if needed to maintain MAP goal -Trend  lactic acid -Cardiology following appreciate input -Hold home Torsemide, BB, & Spironolactone given Hypotension & AKI  ACUTE KIDNEY INJURY/Renal Failure Anion Gap Metabolic Acidosis in setting of Lactic Acidosis -Monitor I&O's / urinary output -Follow BMP -Ensure adequate renal perfusion -Avoid nephrotoxic agents as able -Replace electrolytes as indicated -Trend lactic acid  UTI -Monitor fever curve -Trend WBC's & Procalcitonin -Follow cultures as above -Place on Rocephin  History of COPD & Asthma without acute exacerbation (chronically on 3L supplemental O2) Obstructive Sleep [place] -Supplemental O2 as needed to maintain O2 sats 88 to 92% -BiPAP qhs -Follow intermittent CXR & ABG as needed -Prn Bronchodilators  Diabetes Mellitus -CBG's -SSI if needed -Follow ICU Hypo/hyperglycemia protocol -Hold home Metformin & Glipizide         BEST PRACTICES DISPOSITION: ICU GOALS OF CARE: Full code VTE PROPHYLAXIS: SCD's (no chemical prophylaxis given GI Bleed) CONSULTS: GI, Cardiology UPDATES: Updated pt at bedside 06/07/20  Darel Hong, AGACNP-BC [facility] Pager: [phone]

## 2020-06-07 NOTE — ED Provider Notes (Signed)
Patient empirically covered with antibiotic given presentation with mild hypothermia and leukocytosis however clinical history suggestive of hemorrhagic shock is etiology, but given patient's presentation and leukocytosis hypothermia cannot exclude all concerns for infection, thus empirically treated with 1 g Rocephin with cultures ordered for following   Betty Kitten, MD 06/07/20 1642

## 2020-06-07 NOTE — ED Triage Notes (Signed)
Patient arrived via EMS. Patient is AOx4 and ambulatory however a little weak when ambulating from EMS stretcher to ED stretcher. Patient called 911 due to having dark tarry stools for the past 3x days. Patient has no complaints of pain.   Patient is on 3L nasal [place] at baseline.

## 2020-06-07 NOTE — Care Management (Signed)
This is a no charge note  I was called to admit this pt by Dr. Jacqualine Code. Pt has a history of A. Fib, PE, mitral valve replacement with mechanical valve on Coumadin, with RVR 4.6.  Presents with dark stool and GI bleeding.  Hemoglobin dropped from 12.0 on 04/18/20 --> 6.1.  Patient's presentation is extremely complex.  Dr. Jacqualine Code consulted ICU, Dr. Mortimer Fries agreed to admit this patient to ICU.  I canceled admission.  Ivor Costa, MD  [facility]   If 7PM-7AM, please contact night-coverage [email or web address] 06/07/2020, 5:41 PM

## 2020-06-07 NOTE — ED Provider Notes (Signed)
[facility] Emergency Department Provider Note ____________________________________________   First MD Initiated Contact with Patient 06/07/20 1343     (approximate)  I have reviewed the triage vital signs and the nursing notes.  HISTORY  Chief Complaint Rectal Bleeding  HPI Betty [name] is a 74 y.o. female here for evaluation of abnormal labs and black stool  Patient reports that this morning  she had a on spontaneous small nosebleed and she is been having dark stools for the last couple of days  Associated with fatigue.  Has a history of intestinal or stomach bleeding in the past, also history of congestive heart failure, uses oxygen 3 L at home all the time.  No shortness of breath.  No chest pain.  Denies any pain or discomfort.  No abdominal pain.  Having loose very black stools today  On Coumadin and has mechanical heart valve  Past Medical History:  Diagnosis Date  . Acute diastolic heart failure ([place])   . Allergy   . 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 Asthma   . Atrial fibrillation ([place])   . Backache, unspecified   . Cancer ([place])    skin  . Cardiomegaly   . COPD (chronic obstructive pulmonary disease) ([place])   . Diabetes mellitus without complication ([place])   . 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 Esophageal reflux   . Headache(784.0)   . Herpes zoster without mention of complication   . Hx: UTI (urinary tract infection)   . Hypertension    heart controlled w CHF  . Nontoxic uninodular goiter   . Obesity, unspecified   . Osteoarthrosis, unspecified whether generalized or localized, unspecified site   . Unspecified sleep apnea   . Urine incontinence    hx of    Patient Active Problem List   Diagnosis Date Noted  . Shock ([place]) 06/07/2020  . Gait instability 08/06/2019  . Severe sepsis ([place]) 12/25/2018  . Abdominal pain   . Goals of care, counseling/discussion   . Palliative care by specialist     . Respiratory failure, acute ([place]) 02/26/2018  . COPD (chronic obstructive pulmonary disease) ([place]) 02/26/2018  . C2 cervical fracture ([place]) 02/19/2018  . Hematuria 01/27/2018  . Carotid stenosis 12/23/2017  . Varicose veins of both lower extremities with pain 11/17/2017  . Acute on chronic diastolic CHF (congestive heart failure) ([place]) 08/13/2017  . Leg pain 07/14/2017  . Chronic venous insufficiency 07/14/2017  . PAD (peripheral artery disease) ([place]) 07/14/2017  . Postprocedural hemorrhage due to complication of oral surgery 03/27/2017  . Acute blood loss anemia 03/27/2017  . H/O mitral valve replacement with mechanical valve 03/27/2017  . Atypical atrial flutter ([place]) 01/23/2017  . Long term current use of anticoagulants with INR goal of 2.5-3.5 09/18/2016  . Syncope 06/25/2016  . UTI (urinary tract infection) 06/25/2016  . Afib ([place]) 05/10/2016  . Dyspnea 05/10/2016  . Chronic diastolic CHF (congestive heart failure) ([place]) 05/10/2016  . Anemia 05/10/2016  . Anemia 05/10/2016  . Other specified abnormal immunological findings in serum 04/24/2016  . Sepsis ([place]) 04/01/2016  . Prolonged Q-T interval on ECG 03/13/2016  . Mitral stenosis 03/13/2016  . Interstitial lung disease ([place]) 12/29/2015  . Chronic LBP 07/26/2015  . Essential (primary) hypertension 07/26/2015  . Idiopathic insomnia 07/26/2015  . Restless leg 07/26/2015  . Obesity (BMI 30-39.9) 01/07/2015  . Abnormal EKG 09/11/2014  . External nasal lesion 02/26/2014  . Abnormal liver function tests 01/28/2014  .  Ankle pain, right 01/28/2014  . Arthralgia of ankle or foot 01/28/2014  . Urinary frequency 12/24/2013  . Hemorrhoids 12/24/2013  . Neck pain on right side 11/10/2013  . Vitamin D deficiency 09/09/2013  . Urinary incontinence 05/30/2013  . History of colonic polyps 05/30/2013  . Controlled type 2 diabetes mellitus without complication ([place]) 34/19/3790  . H/O deep venous thrombosis 05/05/2013  . History of  pulmonary embolism 05/05/2013  . 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 OSA (obstructive sleep apnea) 05/05/2013  . Depression 04/13/2009  . THYROID NODULE 04/13/2009  . 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 GERD 04/13/2009  . OSTEOARTHRITIS 04/13/2009  . BACK PAIN 04/13/2009  . HEADACHE 04/13/2009  . Gastro-esophageal reflux disease without esophagitis 04/13/2009  . Mild episode of recurrent major depressive disorder ([place]) 04/13/2009  . Benign hypertensive heart disease with heart failure ([place]) 03/28/2009  . PAF (paroxysmal atrial fibrillation) ([place]) 03/28/2009  . VENTRICULAR HYPERTROPHY, LEFT 03/28/2009  . Hypertensive cardiomyopathy, with heart failure ([place]) 03/28/2009    Past Surgical History:  Procedure Laterality Date  . ABDOMINAL HYSTERECTOMY  1979   complete (for precancerous cells)  . ABLATION  2011 & 2014  . bladder botox  01/26/2018  . CHOLECYSTECTOMY    . CYSTOSCOPY WITH FULGERATION N/A 01/28/2018   Procedure: [place] AND CLOT EVACUATION;  Surgeon: Hollice Espy, MD;  Location: [facility] ORS;  Service: Urology;  Laterality: N/A;  . Interstim Placement  2012  . OOPHORECTOMY      Prior to Admission medications   Medication Sig Start Date End Date Taking? Authorizing Provider  atorvastatin (LIPITOR) 20 MG tablet Take 20 mg by mouth at bedtime.  01/25/19  Yes [provider]  azaTHIOprine (IMURAN) 50 MG tablet Take 2 tablets by mouth daily.  06/18/19  Yes [provider]  bumetanide (BUMEX) 2 MG tablet TAKE 3 TABLETS EVERY MORNING, 2 TABLETS EVERY AFTERNOON MAY TAKE 1 EXTRA TABLET DAILY AS NEEDED FOR VOLUME OVERLOAD 05/17/20  Yes [provider]  cyclobenzaprine (FLEXERIL) 10 MG tablet Take 10 mg by mouth 3 (three) times daily as needed. 06/05/20  Yes [provider]  ferrous sulfate 325 (65 FE) MG EC tablet Take 325 mg by mouth daily.  06/23/17  Yes [provider]  glipiZIDE (GLUCOTROL XL) 5 MG 24 hr  tablet Take 1 tablet by mouth daily.  07/10/19  Yes [provider]  Lactobacillus-Inulin (CULTURELLE ADULT ULT BALANCE PO) Take by mouth.   Yes [provider]  levocetirizine (XYZAL) 5 MG tablet Take 5 mg by mouth at bedtime. 10/15/18  Yes [provider]  magnesium oxide (MAG-OX) 400 MG tablet Take 1 tablet by mouth daily. 05/23/20  Yes [provider]  metFORMIN (GLUCOPHAGE) 1000 MG tablet Take 1 tablet by mouth 2 (two) times daily.  03/08/19  Yes [provider]  metolazone (ZAROXOLYN) 2.5 MG tablet as needed (fluid retention). :TAKE 1 TABLET BY MOUTH EVERY MONDAY, WEDNESDAY AND FRIDAY 30 MINUTES PRIOR TO MORNING BUMEX 02/16/19  Yes [provider]  metoprolol succinate (TOPROL-XL) 25 MG 24 hr tablet Take 25 mg by mouth daily. TAKE 1 TABLET BY MOUTH DAILY   Yes [provider]  pantoprazole (PROTONIX) 40 MG tablet Take 40 mg by mouth daily. 10/15/18  Yes [provider]  pregabalin (LYRICA) 75 MG capsule Take 75 mg by mouth daily. 05/11/20  Yes [provider]  rOPINIRole (REQUIP) 1 MG tablet Take 3 mg by mouth. TAKE THREE TABLETS 3 TIMES DAILY  Yes [provider]  senna-docusate (SENOKOT-S) 8.6-50 MG tablet Take 1 tablet by mouth 2 (two) times daily.    Yes [provider]  spironolactone (ALDACTONE) 50 MG tablet Take 50 mg by mouth daily.  01/13/19  Yes [provider]  traZODone (DESYREL) 50 MG tablet Take 50 mg by mouth at bedtime.  10/26/18  Yes [provider]  venlafaxine XR (EFFEXOR-XR) 150 MG 24 hr capsule daily. TAKE 1 CAPSULE BY MOUTH ONCE DAILY TAKE WITH 37.5MG  TO EQUAL 187.5MG  DAILY 10/02/18  Yes [provider]  venlafaxine XR (EFFEXOR-XR) 37.5 MG 24 hr capsule TAKE 1 CAPSULE BY MOUTH ONCE DAILY TAKE WITH 150MG  TO EQUAL 187.5MG  DAILY   Yes [provider]  warfarin (COUMADIN) 3 MG tablet daily. TAKE 1 TABLET NIGHTLY USE IN COMBINATION WITH 4MG  TABLET FOR  TOTAL OF 7MG  EVERY MONDAY,TUESDAY,THURSDAY  and  FRIDAY   Yes [provider]  warfarin (COUMADIN) 4 MG tablet daily. TAKE 1 TABLET NIGHTLY USE IN COMBINATION WITH 5MG  TABLET FOR TOTAL OF 7MG  EVERY MONDAY,TUESDAY,THURSDAY  and  Friday) TAKE 2 TABLETS WEDNESDAY, SATURDAY, Sunday   Yes [provider]    Allergies Flecainide and Amiodarone  Family History  Problem Relation Age of Onset  . Heart attack Father   . Heart failure Father   . Arthritis Father   . Stroke Father   . Hypertension Father   . Coronary artery disease Brother   . Peripheral vascular disease Brother   . Arthritis Mother   . Colon cancer Mother        colon cancer  . Hypertension Mother   . Cancer Maternal Grandmother        colon cancer  . Arthritis Maternal Grandmother     Social History Social History   Tobacco Use  . Smoking status: Former Smoker    Packs/day: 0.50    Years: 15.00    Pack years: 7.50    Types: Cigarettes  . Smokeless tobacco: Never Used  . Tobacco comment: Has a 20-pack-year history, qutting in 1999  Vaping Use  . Vaping Use: Never used  Substance Use Topics  . Alcohol use: No    Alcohol/week: 0.0 standard drinks  . Drug use: No    Review of Systems Constitutional: No fever/chills Eyes: No visual changes. ENT: See HPI Cardiovascular: Denies chest pain. Respiratory: Denies shortness of breath. Gastrointestinal: No abdominal pain.  See HPI Genitourinary: Negative for dysuria. Musculoskeletal: Negative for back pain. Skin: Negative for rash. Neurological: Negative for headaches.    ____________________________________________   PHYSICAL EXAM:  VITAL SIGNS: ED Triage Vitals  Enc Vitals Group     BP 06/07/20 1246 (!) 99/44     Pulse Rate 06/07/20 1246 66     Resp 06/07/20 1246 19     Temp 06/07/20 1307 (S) (!) 96.5 F (35.8 C)     Temp Source 06/07/20 1307 (S) Rectal     SpO2 06/07/20 1246 99 %     Weight 06/07/20 1248 198 lb 6.6 oz (90 kg)      Height 06/07/20 1248 5\' 9"  (1.753 m)     Head Circumference --      Peak Flow --      Pain Score 06/07/20 1248 0     Pain Loc --      Pain Edu? --      Excl. in [place]? --     Constitutional: Alert and oriented.  Somewhat pale in appearance but in no acute distress.  Eyes: Conjunctivae are normal.  Slight dried blood anterior nares but no active bleeding. Head: Atraumatic. Nose: No congestion/rhinnorhea. Mouth/Throat: Mucous membranes are moist. Neck: No stridor.  Cardiovascular: Normal rate, regular rhythm. Grossly normal heart sounds.  Good peripheral circulation. Respiratory: Normal respiratory effort.  No retractions. Lungs CTAB. Gastrointestinal: Soft and nontender. No distention.  Rectal exam demonstrates dark black very much heme positive stool with positive control Musculoskeletal: No lower extremity tenderness nor edema. Neurologic:  Normal speech and language. No gross focal neurologic deficits are appreciated.  Skin:  Skin is warm, dry and intact. No rash noted. Psychiatric: Mood and affect are normal. Speech and behavior are normal.  ____________________________________________   LABS (all labs ordered are listed, but only abnormal results are displayed)  Labs Reviewed  CBC - Abnormal; Notable for the following components:      Result Value   WBC 18.4 (*)    RBC 1.97 (*)    Hemoglobin 6.1 (*)    HCT 18.6 (*)    RDW 17.7 (*)    All other components within normal limits  COMPREHENSIVE METABOLIC PANEL - Abnormal; Notable for the following components:   Sodium 126 (*)    Chloride 79 (*)    Glucose, Bld 111 (*)    BUN 180 (*)    Creatinine, Ser 2.67 (*)    Calcium 8.4 (*)    Total Protein 6.1 (*)    GFR calc non Af Amer 17 (*)    GFR calc Af Amer 20 (*)    Anion gap 21 (*)    All other components within normal limits  PROTIME-INR - Abnormal; Notable for the following components:   Prothrombin Time 41.8 (*)    INR 4.6 (*)    All other components within normal  limits  APTT - Abnormal; Notable for the following components:   aPTT 42 (*)    All other components within normal limits  SARS CORONAVIRUS 2 BY RT PCR (HOSPITAL ORDER, [facility])  CULTURE, BLOOD (ROUTINE X 2)  CULTURE, BLOOD (ROUTINE X 2)  URINE CULTURE  URINALYSIS, COMPLETE (UACMP) WITH MICROSCOPIC  LACTIC ACID, PLASMA  PROCALCITONIN  TYPE AND SCREEN  PREPARE RBC (CROSSMATCH)   ____________________________________________  EKG  Reviewed enterotomy at 1300 Heart rate 65 QRs 110 QTc 500 Normal sinus rhythm, mild nonspecific T wave abnormality, consider possible subendocardial ischemia.  No STEMI ____________________________________________  RADIOLOGY  No results found.   ____________________________________________   PROCEDURES  Procedure(s) performed: None  Procedures  Critical Care performed: Yes, see critical care note(s)  CRITICAL CARE Performed by: Delman Kitten   Total critical care time: 45 minutes  Critical care time was exclusive of separately billable procedures and treating other patients.  Critical care was necessary to treat or prevent imminent or life-threatening deterioration.  Critical care was time spent personally by me on the following activities: development of treatment plan with patient and/or surrogate as well as nursing, discussions with consultants, evaluation of patient's response to treatment, examination of patient, obtaining history from patient or surrogate, ordering and performing treatments and interventions, ordering and review of laboratory studies, ordering and review of radiographic studies, pulse oximetry and re-evaluation of patient's condition.  ____________________________________________   INITIAL IMPRESSION / ASSESSMENT AND PLAN / ED COURSE  Pertinent labs & imaging results that were available during my care of the patient were reviewed by me and considered in my medical decision making (see  chart for details).   Patient transfer evaluation for  dark stools epistaxis.  Appears acute GI bleeding, likely of an upper source given her history nature.  Also associated with hypotension.  Patient ordered blood transfusion, significant acute anemia.  Consults to cardiology, gastroenterology, ICU team admitting.  Patient is anticoagulated, currently cardiology recommending transfusion but no reversal of anticoagulation given her very high risk mechanical valve  EKG concerning for possible subendocardial ischemia, however patient currently actively bleeding while anticoagulated.  No associated chest pain.  I suspect demand ischemia is potential  Clinical Course as of Jun 07 1640  Wed Jun 07, 2020  1338 Consult placed with GI, Dr. Alice Reichert agrees emergent consult. Await further test to help guide some of her care including return of INR. Agrees with transfusion at this time, patient does have a mechanical valve. Dr. Alice Reichert requests admission to the hospitalist service   [MQ]  1342 Dr. Ubaldo Glassing advises last INR 3.1 in their system. Further guidance once INR returns on today's labs.    [MQ]  0109 Patient verbally consents to blood transfusion.   Hemoglobin(!): 6.1 [MQ]  1403 Very medically complex care given her past history and anticoagulation.  I have discussed this case now with Dr. Alice Reichert, Dr. Ubaldo Glassing of cardiology, Dr. Marchia Bond of the ICU.  All are now involved in her care, ICU team Dr. Mortimer Fries admitting to his service. Awaiting consult recs from GI, cardiology regarding anticoagulation/reversal, etc. Patient alert, BP 95/63 conversant in no acute distress but does appear fatigued and somewhat pale.    [MQ]    Clinical Course User Index [MQ] Delman Kitten, MD   Vitals:   06/07/20 1515 06/07/20 1524  BP: 93/67   Pulse:    Resp: 20   Temp:  97.6 F (36.4 C)  SpO2:     ----------------------------------------- 4:41 PM on 06/07/2020 -----------------------------------------   Patient now under the  care of the ICU team.  Admitted to Dr. Mortimer Fries.  Patient remains alert, oriented conversant blood pressure 100/47 heart rate 70 at this time  ____________________________________________   FINAL CLINICAL IMPRESSION(S) / ED DIAGNOSES  Final diagnoses:  Shock ([place])  Gastrointestinal hemorrhage, unspecified gastrointestinal hemorrhage type  SIRS (systemic inflammatory response syndrome) ([place])        Note:  This document was prepared using Dragon voice recognition software and may include unintentional dictation errors       Delman Kitten, MD 06/07/20 1641

## 2020-06-07 NOTE — Plan of Care (Signed)

## 2020-06-08 ENCOUNTER — Inpatient Hospital Stay: Payer: Medicare PPO

## 2020-06-08 LAB — CBC
HCT: 24 % — ABNORMAL LOW (ref 36.0–46.0)
Hemoglobin: 8.2 g/dL — ABNORMAL LOW (ref 12.0–15.0)
MCH: 30.7 pg (ref 26.0–34.0)
MCHC: 34.2 g/dL (ref 30.0–36.0)
MCV: 89.9 fL (ref 80.0–100.0)
Platelets: 241 10*3/uL (ref 150–400)
RBC: 2.67 MIL/uL — ABNORMAL LOW (ref 3.87–5.11)
RDW: 17.2 % — ABNORMAL HIGH (ref 11.5–15.5)
WBC: 14.7 10*3/uL — ABNORMAL HIGH (ref 4.0–10.5)
nRBC: 0 % (ref 0.0–0.2)

## 2020-06-08 LAB — BLOOD GAS, ARTERIAL
Acid-Base Excess: 7.6 mmol/L — ABNORMAL HIGH (ref 0.0–2.0)
Bicarbonate: 32.6 mmol/L — ABNORMAL HIGH (ref 20.0–28.0)
FIO2: 0.36
O2 Saturation: 97.6 %
Patient temperature: 37
pCO2 arterial: 48 mmHg (ref 32.0–48.0)
pH, Arterial: 7.44 (ref 7.350–7.450)
pO2, Arterial: 95 mmHg (ref 83.0–108.0)

## 2020-06-08 LAB — PROTIME-INR
INR: 3.3 — ABNORMAL HIGH (ref 0.8–1.2)
Prothrombin Time: 32.5 seconds — ABNORMAL HIGH (ref 11.4–15.2)

## 2020-06-08 LAB — BASIC METABOLIC PANEL
Anion gap: 18 — ABNORMAL HIGH (ref 5–15)
BUN: 183 mg/dL — ABNORMAL HIGH (ref 8–23)
CO2: 30 mmol/L (ref 22–32)
Calcium: 8 mg/dL — ABNORMAL LOW (ref 8.9–10.3)
Chloride: 83 mmol/L — ABNORMAL LOW (ref 98–111)
Creatinine, Ser: 2.64 mg/dL — ABNORMAL HIGH (ref 0.44–1.00)
GFR calc Af Amer: 20 mL/min — ABNORMAL LOW (ref >60)
GFR calc non Af Amer: 17 mL/min — ABNORMAL LOW (ref >60)
Glucose, Bld: 119 mg/dL — ABNORMAL HIGH (ref 70–99)
Potassium: 4 mmol/L (ref 3.5–5.1)
Sodium: 131 mmol/L — ABNORMAL LOW (ref 135–145)

## 2020-06-08 LAB — GLUCOSE, CAPILLARY
Glucose-Capillary: 123 mg/dL — ABNORMAL HIGH (ref 70–99)
Glucose-Capillary: 132 mg/dL — ABNORMAL HIGH (ref 70–99)
Glucose-Capillary: 156 mg/dL — ABNORMAL HIGH (ref 70–99)
Glucose-Capillary: 188 mg/dL — ABNORMAL HIGH (ref 70–99)
Glucose-Capillary: 49 mg/dL — ABNORMAL LOW (ref 70–99)
Glucose-Capillary: 95 mg/dL (ref 70–99)
Glucose-Capillary: 98 mg/dL (ref 70–99)

## 2020-06-08 LAB — HEMOGLOBIN AND HEMATOCRIT, BLOOD
HCT: 24.9 % — ABNORMAL LOW (ref 36.0–46.0)
HCT: 23 % — ABNORMAL LOW (ref 36.0–46.0)
Hemoglobin: 8.4 g/dL — ABNORMAL LOW (ref 12.0–15.0)
Hemoglobin: 7.7 g/dL — ABNORMAL LOW (ref 12.0–15.0)

## 2020-06-08 LAB — MAGNESIUM: Magnesium: 3.4 mg/dL — ABNORMAL HIGH (ref 1.7–2.4)

## 2020-06-08 LAB — PHOSPHORUS: Phosphorus: 6.5 mg/dL — ABNORMAL HIGH (ref 2.5–4.6)

## 2020-06-08 LAB — PROCALCITONIN: Procalcitonin: 0.36 ng/mL

## 2020-06-08 LAB — LACTIC ACID, PLASMA: Lactic Acid, Venous: 2.3 mmol/L (ref 0.5–1.9)

## 2020-06-08 MED ORDER — DEXTROSE-NACL 5-0.9 % IV SOLN
INTRAVENOUS | Status: DC
  Administered 2020-06-08: 50 mL/h via INTRAVENOUS

## 2020-06-08 MED ORDER — INSULIN ASPART 100 UNIT/ML ~~LOC~~ SOLN: 0.0000 [IU] | Freq: Every day | SUBCUTANEOUS | Status: DC

## 2020-06-08 MED ORDER — LACTATED RINGERS IV BOLUS
1000.0000 mL | Freq: Once | INTRAVENOUS | Status: AC
  Administered 2020-06-08: 1000 mL via INTRAVENOUS

## 2020-06-08 MED ORDER — INSULIN ASPART 100 UNIT/ML ~~LOC~~ SOLN
0.0000 [IU] | Freq: Three times a day (TID) | SUBCUTANEOUS | Status: DC
  Administered 2020-06-08 – 2020-06-10 (×5): 2 [IU] via SUBCUTANEOUS
  Administered 2020-06-10 (×2): 1 [IU] via SUBCUTANEOUS
  Administered 2020-06-11 (×2): 2 [IU] via SUBCUTANEOUS
  Administered 2020-06-12 (×3): 1 [IU] via SUBCUTANEOUS
  Administered 2020-06-13 – 2020-06-16 (×6): 2 [IU] via SUBCUTANEOUS
  Administered 2020-06-17: 3 [IU] via SUBCUTANEOUS
  Administered 2020-06-17: 2 [IU] via SUBCUTANEOUS
  Administered 2020-06-18 – 2020-06-19 (×4): 1 [IU] via SUBCUTANEOUS
  Filled 2020-06-08 (×24): qty 1

## 2020-06-08 MED ORDER — ROPINIROLE HCL 1 MG PO TABS
3.0000 mg | ORAL_TABLET | Freq: Three times a day (TID) | ORAL | Status: DC
  Administered 2020-06-08 – 2020-06-13 (×11): 3 mg via ORAL
  Filled 2020-06-08 (×16): qty 3

## 2020-06-08 MED ORDER — DEXTROSE 50 % IV SOLN
INTRAVENOUS | Status: AC
  Administered 2020-06-08: 50 mL via INTRAVENOUS
  Filled 2020-06-08: qty 50

## 2020-06-08 MED ORDER — AZATHIOPRINE 50 MG PO TABS
100.0000 mg | ORAL_TABLET | Freq: Every day | ORAL | Status: DC
  Administered 2020-06-08 – 2020-06-19 (×10): 100 mg via ORAL
  Filled 2020-06-08 (×12): qty 2

## 2020-06-08 MED ORDER — DEXTROSE 50 % IV SOLN: 50.0000 mL | Freq: Once | INTRAVENOUS | Status: AC

## 2020-06-08 NOTE — Progress Notes (Signed)
[facility]    SUBJECTIVE: [name] is a 74 year old female with a past medical history significant for mitral valve disease s/p MVR with a mechanical valve, anticoagulated with warfarin, longstanding persistent atrial fibrillation with biventricular pacemaker in place, HFpEF, oxygen dependent ILD, OSA, and vasculitis who presented to the ED on 06/08/20 for a week long history of dark stools.  Workup included a supratherapuetic INR of 4.6, hemoglobin of 6.1, creatinine of 2.67, and BUN of 180.  She was hypotensive with BP as low as 83/51.   Today, she denies any cardiac complaints including chest pain, palpitations, shortness of breath, or lower extremity swelling, but admits to bilateral lower extremity pain and cramping. She reports very little oral intake over the past 3-4 days.    Vitals:   06/08/20 0730 06/08/20 0800 06/08/20 0806 06/08/20 0830  BP: (!) 82/51  (!) 87/57 (!) 93/40  Pulse:  (!) 111 91 77  Resp: (!) 21 19 11 20   Temp:    (!) 97.5 F (36.4 C)  TempSrc:    Oral  SpO2:  (!) 79% (!) 75% 98%  Weight:      Height:         Intake/Output Summary (Last 24 hours) at 06/08/2020 0844 Last data filed at 06/08/2020 0623 Gross per 24 hour  Intake 1061.45 ml  Output 1150 ml  Net -88.55 ml      PHYSICAL EXAM  General: Well developed, well nourished, in mild distress HEENT:  Normocephalic and atramatic Neck:  No JVD.  Lungs: Clear bilaterally to auscultation and percussion. Heart: Irregularly irregular, rate controlled. Normal S1 and S2 without gallops or murmurs.  Abdomen: Bowel sounds are positive, abdomen soft and non-tender  Msk:  Back normal. Normal strength and tone for age. Extremities: No clubbing, cyanosis or edema.   Neuro: Alert and oriented X 3. Psych:  Good affect, responds appropriately   LABS: Basic Metabolic Panel: Recent Labs    06/07/20 1242 06/08/20 0639  NA 126* 131*  K 4.8 4.0  CL 79* 83*  CO2 26 30  GLUCOSE 111* 119*  BUN 180* 183*    CREATININE 2.67* 2.64*  CALCIUM 8.4* 8.0*  MG  --  3.4*  PHOS  --  6.5*   Liver Function Tests: Recent Labs    06/07/20 1242  AST 34  ALT 17  ALKPHOS 79  BILITOT 1.1  PROT 6.1*  ALBUMIN 3.6   No results for input(s): LIPASE, AMYLASE in the last 72 hours. CBC: Recent Labs    06/07/20 1242 06/07/20 2053 06/08/20 0138 06/08/20 0639  WBC 18.4*  --   --  14.7*  HGB 6.1*   < > 8.4* 8.2*  HCT 18.6*   < > 24.9* 24.0*  MCV 94.4  --   --  89.9  PLT 325  --   --  241   < > = values in this interval not displayed.   Cardiac Enzymes: No results for input(s): CKTOTAL, CKMB, CKMBINDEX, TROPONINI in the last 72 hours. BNP: Invalid input(s): POCBNP D-Dimer: No results for input(s): DDIMER in the last 72 hours. Hemoglobin A1C: No results for input(s): HGBA1C in the last 72 hours. Fasting Lipid Panel: No results for input(s): CHOL, HDL, LDLCALC, TRIG, CHOLHDL, LDLDIRECT in the last 72 hours. Thyroid Function Tests: No results for input(s): TSH, T4TOTAL, T3FREE, THYROIDAB in the last 72 hours.  Invalid input(s): FREET3 Anemia Panel: No results for input(s): VITAMINB12, FOLATE, FERRITIN, TIBC, IRON, RETICCTPCT in the last 72  hours.  DG Chest Port 1 View  Result Date: 06/08/2020 CLINICAL DATA:  Shortness of breath, interval changes EXAM: PORTABLE CHEST 1 VIEW COMPARISON:  August 05, 2019 FINDINGS: The heart size and mediastinal contours are unchanged with cardiomegaly. A left-sided pacemaker is again noted. Prosthetic mitral valve and overlying median sternotomy wires are present. There is diffusely increased interstitial markings seen throughout both lungs as on the prior exam. There is also prominence of the central pulmonary vasculature. IMPRESSION: Cardiomegaly and pulmonary vascular congestion. Diffusely increased interstitial markings seen throughout both lungs, likely due to chronic lung changes. Electronically Signed   By: Prudencio Pair M.D.   On: 06/08/2020 04:21      TELEMETRY: Atrial fibrillation, controlled ventricular response   ASSESSMENT AND PLAN:  Active Problems:   Afib ([place])   Shock ([place])    1.  Mitral valve disease s/p MVR with mechanical valve   -INR now therapeutic at 3.3; would not recommend treatment with vitamin K   -If she becomes hemodynamically unstable, we could consider FFP if needed   -Will continue to remain off of diuretics   2.  Atrial fibrillation   -Rate remains well controlled   3.  Anemia/GI bleed    -Hemoglobin improved after transfusion; GI following   4.  Hypotension   -Continues to improve; wouldn't recommend pressors as patient appears stable   The history, physical exam findings, and plan of care were all discussed with Dr. Bartholome Bill, and all decision making was made in collaboration.   Avie Arenas  PA-C 06/08/2020 8:44 AM

## 2020-06-08 NOTE — Progress Notes (Signed)
Pt is resting in bed quietly with eyes closed at this time. No s/s of distress noted. Pt received 1 liter LR bolus earlier this morning for hypotension. Pt responded well and BP has been stable but soft. Pt has also complained about leg cramps throughout the day and pharmacy reordered home dose of requip. Pt has been very drowsy since requip was given but wakes easily and is able to answer questions. She will fall quickly back to sleep though when left alone. Pt has also had some intermittent confusion throughout the shift. Family has called and been updated.

## 2020-06-08 NOTE — Progress Notes (Signed)
GI Inpatient Follow-up Note  Subjective:  Patient seen in follow-up for acute blood loss anemia. No acute overnight events. Hemoglobin has increased to 8.2 this morning and INR 3.3. No family at bedside at time of examination. Patient is arouseable to voice and able to answer yes/no questions, but falls asleep without redirection. She denies any nausea, vomiting, or abdominal pain. She has had bilateral lower extremity cramping this afternoon. BP improved today. No signs of active GI bleeding.   Scheduled Inpatient Medications:  . azaTHIOprine  100 mg Oral Daily  . Chlorhexidine Gluconate Cloth  6 each Topical Daily  . insulin aspart  0-5 Units Subcutaneous QHS  . insulin aspart  0-9 Units Subcutaneous TID WC  . mouth rinse  15 mL Mouth Rinse BID  . [START ON 06/11/2020] pantoprazole  40 mg Intravenous Q12H  . rOPINIRole  3 mg Oral TID  . sodium chloride flush  3 mL Intravenous Q12H    Continuous Inpatient Infusions:   . sodium chloride    . cefTRIAXone (ROCEPHIN)  IV 1 g (06/07/20 2002)  . dextrose 5 % and 0.9% NaCl 50 mL/hr at 06/08/20 1515  . pantoprozole (PROTONIX) infusion 8 mg/hr (06/08/20 1711)    PRN Inpatient Medications:  sodium chloride, acetaminophen, docusate sodium, ondansetron (ZOFRAN) IV, polyethylene glycol, sodium chloride flush  Review of Systems:  Unable to tolerate due to critical illness   Physical Examination: BP (!) 131/54   Pulse 62   Temp 97.9 F (36.6 C) (Oral)   Resp 19   Ht 5\' 9"  (1.753 m)   Wt 96 kg   SpO2 95%   BMI 31.25 kg/m  Ill-appearing female laying in hospital bed. Responsive to questions, but easily falls asleep without redirection Gen: NAD, alert to self HEENT: [place]LA, EOMI, Neck: supple, no JVD or thyromegaly Chest: CTA bilaterally, no wheezes, crackles, or other adventitious sounds CV: RRR, no m/g/c/r Abd: soft, NT, ND, +BS in all four quadrants; no HSM, guarding, ridigity, or rebound tenderness Ext: no edema, well perfused  with 2+ pulses, Skin: no rash or lesions noted Lymph: no LAD  Data: Lab Results  Component Value Date   WBC 14.7 (H) 06/08/2020   HGB 7.7 (L) 06/08/2020   HCT 23.0 (L) 06/08/2020   MCV 89.9 06/08/2020   PLT 241 06/08/2020   Recent Labs  Lab 06/08/20 0138 06/08/20 0639 06/08/20 1319  HGB 8.4* 8.2* 7.7*   Lab Results  Component Value Date   NA 131 (L) 06/08/2020   K 4.0 06/08/2020   CL 83 (L) 06/08/2020   CO2 30 06/08/2020   BUN 183 (H) 06/08/2020   CREATININE 2.64 (H) 06/08/2020   Lab Results  Component Value Date   ALT 17 06/07/2020   AST 34 06/07/2020   ALKPHOS 79 06/07/2020   BILITOT 1.1 06/07/2020   Recent Labs  Lab 06/07/20 1242 06/07/20 1242 06/08/20 0639  APTT 42*  --   --   INR 4.6*   < > 3.3*   < > = values in this interval not displayed.   Assessment/Plan: 74 y/o Caucasian female with a PMH of atrial fibrillation on chronic anticoagulation with Warfarin, COPD, HTN, Hx of interstitial lung disease, ANCA associated vasculitis, Hx of DVT, Hx of mitral valve replacement, and hx of diabetes admitted for acute blood loss anemia  1. 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 Hemoglobin dropped 12.0 to 6.1 in a  6 week span, consistent with GIB.  2. Heme positive stool  3. 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 Supratherapeutic INR - INR 4.6 on admission, currently in therapeutic range given her history of mitral valve repair with INR 3.3  5. Acute renal failure with UTI   Recommendations:  1. Pt appears hemodynamically stable currently with no signs of active GIB 2. Agree with transfusions as necessary to ensure Hgb >7.0. 3. Continue Protonix gtt 4. Patient will need an INR of 1.5 or less for diagnostic and therapeutic EGD for hemostasis. Heparin bridge could be helpful, but will defer to cardiology 5. Will make NPO  after midnight and recheck INR in the morning 6. Following   Please call if signs of active GIB   Please call with questions or concerns.    Octavia Bruckner, PA-C [facility] [phone] (Cell)

## 2020-06-09 LAB — BASIC METABOLIC PANEL
Anion gap: 13 (ref 5–15)
BUN: 113 mg/dL — ABNORMAL HIGH (ref 8–23)
CO2: 34 mmol/L — ABNORMAL HIGH (ref 22–32)
Calcium: 8.1 mg/dL — ABNORMAL LOW (ref 8.9–10.3)
Chloride: 90 mmol/L — ABNORMAL LOW (ref 98–111)
Creatinine, Ser: 1.76 mg/dL — ABNORMAL HIGH (ref 0.44–1.00)
GFR calc Af Amer: 33 mL/min — ABNORMAL LOW (ref >60)
GFR calc non Af Amer: 28 mL/min — ABNORMAL LOW (ref >60)
Glucose, Bld: 130 mg/dL — ABNORMAL HIGH (ref 70–99)
Potassium: 3.1 mmol/L — ABNORMAL LOW (ref 3.5–5.1)
Sodium: 137 mmol/L (ref 135–145)

## 2020-06-09 LAB — GLUCOSE, CAPILLARY
Glucose-Capillary: 134 mg/dL — ABNORMAL HIGH (ref 70–99)
Glucose-Capillary: 157 mg/dL — ABNORMAL HIGH (ref 70–99)
Glucose-Capillary: 223 mg/dL — ABNORMAL HIGH (ref 70–99)
Glucose-Capillary: 210 mg/dL — ABNORMAL HIGH (ref 70–99)
Glucose-Capillary: 162 mg/dL — ABNORMAL HIGH (ref 70–99)

## 2020-06-09 LAB — TROPONIN I (HIGH SENSITIVITY): Troponin I (High Sensitivity): 28 ng/L — ABNORMAL HIGH (ref <18)

## 2020-06-09 LAB — HEMOGLOBIN A1C
Hgb A1c MFr Bld: 5.5 % (ref 4.8–5.6)
Mean Plasma Glucose: 111 mg/dL

## 2020-06-09 LAB — URINE CULTURE: Culture: >=100000 — AB

## 2020-06-09 LAB — PROTIME-INR
INR: 2.7 — ABNORMAL HIGH (ref 0.8–1.2)
Prothrombin Time: 27.7 seconds — ABNORMAL HIGH (ref 11.4–15.2)

## 2020-06-09 LAB — CBC
HCT: 22.5 % — ABNORMAL LOW (ref 36.0–46.0)
Hemoglobin: 7.6 g/dL — ABNORMAL LOW (ref 12.0–15.0)
MCH: 30.2 pg (ref 26.0–34.0)
MCHC: 33.8 g/dL (ref 30.0–36.0)
MCV: 89.3 fL (ref 80.0–100.0)
Platelets: 199 10*3/uL (ref 150–400)
RBC: 2.52 MIL/uL — ABNORMAL LOW (ref 3.87–5.11)
RDW: 17.4 % — ABNORMAL HIGH (ref 11.5–15.5)
WBC: 9.7 10*3/uL (ref 4.0–10.5)
nRBC: 0 % (ref 0.0–0.2)

## 2020-06-09 LAB — MAGNESIUM: Magnesium: 3.1 mg/dL — ABNORMAL HIGH (ref 1.7–2.4)

## 2020-06-09 LAB — PROCALCITONIN: Procalcitonin: <0.1 ng/mL

## 2020-06-09 LAB — PREPARE RBC (CROSSMATCH)

## 2020-06-09 MED ORDER — HEPARIN (PORCINE) 25000 UT/250ML-% IV SOLN
1000.0000 [IU]/h | INTRAVENOUS | Status: DC
  Administered 2020-06-09 – 2020-06-10 (×2): 1000 [IU]/h via INTRAVENOUS
  Filled 2020-06-09 (×2): qty 250

## 2020-06-09 MED ORDER — OXYCODONE-ACETAMINOPHEN 5-325 MG PO TABS
1.0000 | ORAL_TABLET | Freq: Four times a day (QID) | ORAL | Status: DC | PRN
  Administered 2020-06-09 – 2020-06-10 (×2): 1 via ORAL
  Administered 2020-06-10: 2 via ORAL
  Filled 2020-06-09: qty 1
  Filled 2020-06-09: qty 2
  Filled 2020-06-09: qty 1

## 2020-06-09 MED ORDER — KCL IN DEXTROSE-NACL 40-5-0.9 MEQ/L-%-% IV SOLN
INTRAVENOUS | Status: DC
  Filled 2020-06-09: qty 1000

## 2020-06-09 MED ORDER — KCL IN DEXTROSE-NACL 40-5-0.45 MEQ/L-%-% IV SOLN
INTRAVENOUS | Status: DC
  Filled 2020-06-09 (×4): qty 1000

## 2020-06-09 MED ORDER — LACTATED RINGERS IV BOLUS
500.0000 mL | Freq: Once | INTRAVENOUS | Status: AC
  Administered 2020-06-09: 500 mL via INTRAVENOUS

## 2020-06-09 MED ORDER — POTASSIUM CHLORIDE 10 MEQ/100ML IV SOLN
10.0000 meq | INTRAVENOUS | Status: AC
  Administered 2020-06-09 (×2): 10 meq via INTRAVENOUS
  Filled 2020-06-09 (×2): qty 100

## 2020-06-09 MED ORDER — SODIUM CHLORIDE 0.9% IV SOLUTION: Freq: Once | INTRAVENOUS | Status: DC

## 2020-06-09 NOTE — Progress Notes (Signed)
Betty [name] for heparin Indication: bridge while warfarin on hold pending procedure  Allergies  Allergen Reactions  . Flecainide Shortness Of Breath and Other (See Comments)    Reaction: dizziness   . Amiodarone Other (See Comments)    Pt states that this medication causes lung bleeding.      Patient Measurements: Height: 5\' 9"  (175.3 cm) Weight: 96.4 kg (212 lb 8.4 oz) IBW/kg (Calculated) : 66.2 Heparin Dosing Weight: 84.9 kg  Vital Signs: Temp: 98.4 F (36.9 C) (06/18 1600) Temp Source: Oral (06/18 1600) BP: 103/50 (06/18 1800) Pulse Rate: 88 (06/18 1800)  Labs: Recent Labs    06/07/20 1242 06/07/20 1310 06/07/20 2053 06/08/20 0639 06/08/20 0639 06/08/20 1319 06/09/20 0105  HGB 6.1*  --    < > 8.2*   < > 7.7* 7.6*  HCT 18.6*  --    < > 24.0*  --  23.0* 22.5*  PLT 325  --   --  241  --   --  199  APTT 42*  --   --   --   --   --   --   LABPROT 41.8*  --   --  32.5*  --   --  27.7*  INR 4.6*  --   --  3.3*  --   --  2.7*  CREATININE 2.67*  --   --  2.64*  --   --  1.76*  TROPONINIHS  --  30*  --   --   --   --  28*   < > = values in this interval not displayed.    Estimated Creatinine Clearance: 35.2 mL/min (A) (by C-G formula based on SCr of 1.76 mg/dL (H)).   Medical History: Past Medical History:  Diagnosis Date  . Acute diastolic heart failure ([place])   . Allergy   . ANCA-associated vasculitis ([place])   . Asthma   . Atrial fibrillation ([place])   . Backache, unspecified   . Cancer ([place])    skin  . Cardiomegaly   . COPD (chronic obstructive pulmonary disease) ([place])   . Diabetes mellitus without complication ([place])   . 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 Esophageal reflux   . Headache(784.0)   . Herpes zoster without mention of complication   . Hx: UTI (urinary tract infection)   . Hypertension    heart controlled w CHF  . Nontoxic uninodular goiter   . Obesity, unspecified   .  Osteoarthrosis, unspecified whether generalized or localized, unspecified site   . Unspecified sleep apnea   . Urine incontinence    hx of    Assessment: 74 year old female with afib on warfarin PTA. Patient also with mechanical mitral valve replacement with goal INR 2.5-3.5. Warfarin has been on hold for endoscopic procedure. Pharmacy has been consulted to start heparin as bridge while continuing to hold warfarin.  Goal of Therapy:  Heparin level 0.3-0.7 units/ml Monitor platelets by anticoagulation protocol: Yes   Plan:  Now that INR is toward lower end of goal INR, will start heparin. Will defer bolus as patient is currently therapeutically anticoagulated and here with GIB. Will start heparin drip at 1000 units/hr. HL 6/19 at 0400. CBC daily while on heparin drip. Will continue to follow for appropriate restart of warfarin.  Tawnya Crook, PharmD 06/09/2020,6:28 PM

## 2020-06-09 NOTE — Consult Note (Signed)
PHARMACY CONSULT NOTE  Pharmacy Consult for Electrolyte Monitoring and Replacement   Recent Labs: Potassium (mmol/L)  Date Value  06/09/2020 3.1 (L)  09/27/2014 3.7   Magnesium (mg/dL)  Date Value  06/09/2020 3.1 (H)   Calcium (mg/dL)  Date Value  06/09/2020 8.1 (L)   Albumin (g/dL)  Date Value  06/07/2020 3.6   Phosphorus (mg/dL)  Date Value  06/08/2020 6.5 (H)   Sodium (mmol/L)  Date Value  06/09/2020 137   Corrected Ca: 8.42 mg/dL  Assessment: 74 y/o Caucasian female with a PMH ofatrial fibrillation on chronic anticoagulation with Warfarin, COPD, HTN, Hx of interstitial lung disease, ANCA associated vasculitis, Hx of DVT, Hx of mitral valve replacement, and hx of diabetesadmitted for acute blood loss anemia. Pharmacy is asked to replace electrolytes. MIVF: D5NS at 50 mL/hr  Goal of Therapy:  Potassium 4.0 - 5.1 mmol/L Magnesium 2.0 - 2.4 mg/dL All Other Electrolytes WNL  Plan:   Add 40 mEq/L to MIVF and continue at 50 mL/hr  F/U next electrolytes in am 6/19  Dallie Piles ,PharmD Clinical Pharmacist 06/09/2020 8:52 AM

## 2020-06-09 NOTE — Plan of Care (Signed)

## 2020-06-09 NOTE — Progress Notes (Signed)
Follow up - Critical Care Medicine Note  Patient Details:    Betty [name] is an 74 y.o. female admitted with Acute Blood Loss Anemia due to GIB with super therapuetic INR from Warfarin Therapy (therapy for Mechanical Valve) associated with  severe acute renal failure, along with UTI.   Lines/tubes : External Urinary Catheter (Active)  Collection Container Dedicated Suction Canister 06/09/20 1556  Securement Method Tape 06/09/20 1556  Site Assessment Clean;Intact;Dry 06/09/20 1556  Intervention Equipment Changed 06/09/20 1556  Output (mL) 250 mL 06/09/20 1646    Microbiology/Sepsis markers: Results for orders placed or performed during the hospital encounter of 06/07/20  SARS Coronavirus 2 by RT PCR (hospital order, performed in [facility] hospital lab) Nasopharyngeal Nasopharyngeal Swab     Status: None   Collection Time: 06/07/20  1:34 PM   Specimen: Nasopharyngeal Swab  Result Value Ref Range Status   SARS Coronavirus 2 NEGATIVE NEGATIVE Final    Comment: (NOTE) SARS-CoV-2 target nucleic acids are NOT DETECTED.  The SARS-CoV-2 RNA is generally detectable in upper and lower respiratory specimens during the acute phase of infection. The lowest concentration of SARS-CoV-2 viral copies this assay can detect is 250 copies / mL. A negative result does not preclude SARS-CoV-2 infection and should not be used as the sole basis for treatment or other patient management decisions.  A negative result may occur with improper specimen collection / handling, submission of specimen other than nasopharyngeal swab, presence of viral mutation(s) within the areas targeted by this assay, and inadequate number of viral copies (<250 copies / mL). A negative result must be combined with clinical observations, patient history, and epidemiological information.  Fact Sheet for Patients:   StrictlyIdeas.no  Fact Sheet for Healthcare  Providers: BankingDealers.co.za  This test is not yet approved or  cleared by the [place] FDA and has been authorized for detection and/or diagnosis of SARS-CoV-2 by FDA under an Emergency Use Authorization (EUA).  This EUA will remain in effect (meaning this test can be used) for the duration of the COVID-19 declaration under Section 564(b)(1) of the Act, 21 U.S.C. section 360bbb-3(b)(1), unless the authorization is terminated or revoked sooner.  Performed at [facility], [address] [place], [place] [address]   Blood Culture (routine x 2)     Status: None (Preliminary result)   Collection Time: 06/07/20  5:24 PM   Specimen: BLOOD  Result Value Ref Range Status   Specimen Description BLOOD BLOOD RIGHT HAND  Final   Special Requests   Final    BOTTLES DRAWN AEROBIC AND ANAEROBIC Blood Culture adequate volume   Culture   Final    NO GROWTH 2 DAYS Performed at [facility], [address] [place], [place] [address]    Report Status PENDING  Incomplete  Urine culture     Status: Abnormal   Collection Time: 06/07/20  5:24 PM   Specimen: Urine, Clean Catch  Result Value Ref Range Status   Specimen Description   Final    URINE, CLEAN CATCH Performed at [facility], [address] [place], [place] [address]    Special Requests   Final    NONE Performed at [facility], [address] [place], [place] [address]    Culture >=100,000 COLONIES/mL KLEBSIELLA PNEUMONIAE (A)  Final   Report Status 06/09/2020 FINAL  Final   Organism ID, Bacteria KLEBSIELLA PNEUMONIAE (A)  Final      Susceptibility   Klebsiella pneumoniae - MIC*  AMPICILLIN RESISTANT Resistant     CEFAZOLIN <=4 SENSITIVE Sensitive     CEFTRIAXONE <=0.25 SENSITIVE Sensitive     CIPROFLOXACIN <=0.25 SENSITIVE Sensitive     GENTAMICIN <=1 SENSITIVE Sensitive     IMIPENEM <=0.25 SENSITIVE Sensitive     NITROFURANTOIN 64 INTERMEDIATE  Intermediate     TRIMETH/SULFA <=20 SENSITIVE Sensitive     AMPICILLIN/SULBACTAM 4 SENSITIVE Sensitive     PIP/TAZO <=4 SENSITIVE Sensitive     * >=100,000 COLONIES/mL KLEBSIELLA PNEUMONIAE  MRSA PCR Screening     Status: None   Collection Time: 06/07/20  6:25 PM   Specimen: Nasopharyngeal  Result Value Ref Range Status   MRSA by PCR NEGATIVE NEGATIVE Final    Comment:        The GeneXpert MRSA Assay (FDA approved for NASAL specimens only), is one component of a comprehensive MRSA colonization surveillance program. It is not intended to diagnose MRSA infection nor to guide or monitor treatment for MRSA infections. Performed at [facility], [address], [place], [place] [address]     Anti-infectives:  Anti-infectives (From admission, onward)   Start     Dose/Rate Route Frequency Ordered Stop   06/07/20 2100  cefTRIAXone (ROCEPHIN) 1 g in sodium chloride 0.9 % 100 mL IVPB     Discontinue     1 g 200 mL/hr over 30 Minutes Intravenous Every 24 hours 06/07/20 1938 06/14/20 2059     Scheduled Meds: . azaTHIOprine  100 mg Oral Daily  . Chlorhexidine Gluconate Cloth  6 each Topical Daily  . insulin aspart  0-5 Units Subcutaneous QHS  . insulin aspart  0-9 Units Subcutaneous TID WC  . mouth rinse  15 mL Mouth Rinse BID  . [START ON 06/11/2020] pantoprazole  40 mg Intravenous Q12H  . rOPINIRole  3 mg Oral TID  . sodium chloride flush  3 mL Intravenous Q12H   Continuous Infusions: . sodium chloride    . cefTRIAXone (ROCEPHIN)  IV Stopped (06/08/20 2149)  . dextrose 5 % and 0.45 % NaCl with KCl 40 mEq/L 50 mL/hr at 06/09/20 1146  . heparin    . pantoprozole (PROTONIX) infusion 8 mg/hr (06/09/20 1537)   PRN Meds:.sodium chloride, acetaminophen, docusate sodium, ondansetron (ZOFRAN) IV, polyethylene glycol, sodium chloride flush  Best Practice/Protocols:  VTE px: Holding due to GI bleed SUP px:IV Protonix Diet:Regular Diet  Consults: Treatment Team:  Teodoro Spray, MD Efrain Sella, MD    Studies: [facility] Chest Port 1 View  Result Date: 06/08/2020 CLINICAL DATA:  Shortness of breath, interval changes EXAM: PORTABLE CHEST 1 VIEW COMPARISON:  August 05, 2019 FINDINGS: The heart size and mediastinal contours are unchanged with cardiomegaly. A left-sided pacemaker is again noted. Prosthetic mitral valve and overlying median sternotomy wires are present. There is diffusely increased interstitial markings seen throughout both lungs as on the prior exam. There is also prominence of the central pulmonary vasculature. IMPRESSION: Cardiomegaly and pulmonary vascular congestion. Diffusely increased interstitial markings seen throughout both lungs, likely due to chronic lung changes. Electronically Signed   By: Prudencio Pair M.D.   On: 06/08/2020 04:21    SIGNIFICANT EVENTS 6/16: Admitted to ICU   CC  Follow up INR   Subjective:    Overnight Issues: Intermittent confusion but easily re-oriented  Objective:  Vital signs for last 24 hours: Temp:  [97.9 F (36.6 C)-98.4 F (36.9 C)] 98.4 F (36.9 C) (06/18 1600) Pulse Rate:  [56-146] 88 (06/18 1800) Resp:  [14-26]  21 (06/18 1800) BP: (79-110)/(36-94) 103/50 (06/18 1800) SpO2:  [92 %-100 %] 99 % (06/18 1800) FiO2 (%):  [32 %] 32 % (06/18 0000) Weight:  [96.4 kg] 96.4 kg (06/18 0203)  Hemodynamic parameters for last 24 hours:    Intake/Output from previous day: 06/17 0701 - 06/18 0700 In: 3264 [I.V.:1417.6; IV Piggyback:1846.3] Out: 5100 [Urine:5100]  Intake/Output this shift: No intake/output data recorded.  Vent settings for last 24 hours: FiO2 (%):  [32 %] 32 %  PHYSICAL EXAMINATION General Appearance: Acute on chronically ill appearing female, sitting in bed, on chronic 3L [place], in No acute distress  Neuro:Atraumatic, normocephalic, neck supple,no JVD,  speech normal,  HEENT: PERRLA, EOM intact.   Pulmonary: normal breath sounds, No wheezing.  CardiovascularNormal S1,S2.  No  m/r/g.   Abdomen: Benign, Soft, non-tender, nondistended, no guarding or rebound tenderness Renal:  No costovertebral tenderness  GU:  Not performed at this time. Endoc: No evident thyromegaly Skin:   warm, no rashes, no ecchymosis  Extremities: normal, no cyanosis, clubbing. PSYCHIATRIC: Mood, affect within normal limits.  Results for orders placed or performed during the hospital encounter of 06/07/20 (from the past 24 hour(s))  Glucose, capillary     Status: Abnormal   Collection Time: 06/08/20  8:05 PM  Result Value Ref Range   Glucose-Capillary 123 (H) 70 - 99 mg/dL  Procalcitonin     Status: None   Collection Time: 06/09/20  1:05 AM  Result Value Ref Range   Procalcitonin <0.10 ng/mL  Protime-INR     Status: Abnormal   Collection Time: 06/09/20  1:05 AM  Result Value Ref Range   Prothrombin Time 27.7 (H) 11.4 - 15.2 seconds   INR 2.7 (H) 0.8 - 1.2  Basic metabolic panel     Status: Abnormal   Collection Time: 06/09/20  1:05 AM  Result Value Ref Range   Sodium 137 135 - 145 mmol/L   Potassium 3.1 (L) 3.5 - 5.1 mmol/L   Chloride 90 (L) 98 - 111 mmol/L   CO2 34 (H) 22 - 32 mmol/L   Glucose, Bld 130 (H) 70 - 99 mg/dL   BUN 113 (H) 8 - 23 mg/dL   Creatinine, Ser 1.76 (H) 0.44 - 1.00 mg/dL   Calcium 8.1 (L) 8.9 - 10.3 mg/dL   GFR calc non Af Amer 28 (L) >60 mL/min   GFR calc Af Amer 33 (L) >60 mL/min   Anion gap 13 5 - 15  Magnesium     Status: Abnormal   Collection Time: 06/09/20  1:05 AM  Result Value Ref Range   Magnesium 3.1 (H) 1.7 - 2.4 mg/dL  CBC     Status: Abnormal   Collection Time: 06/09/20  1:05 AM  Result Value Ref Range   WBC 9.7 4.0 - 10.5 K/uL   RBC 2.52 (L) 3.87 - 5.11 MIL/uL   Hemoglobin 7.6 (L) 12.0 - 15.0 g/dL   HCT 22.5 (L) 36 - 46 %   MCV 89.3 80.0 - 100.0 fL   MCH 30.2 26.0 - 34.0 pg   MCHC 33.8 30.0 - 36.0 g/dL   RDW 17.4 (H) 11.5 - 15.5 %   Platelets 199 150 - 400 K/uL   nRBC 0.0 0.0 - 0.2 %  Troponin I (High Sensitivity)     Status:  Abnormal   Collection Time: 06/09/20  1:05 AM  Result Value Ref Range   Troponin I (High Sensitivity) 28 (H) <18 ng/L  Glucose, capillary     Status:  Abnormal   Collection Time: 06/09/20  7:37 AM  Result Value Ref Range   Glucose-Capillary 134 (H) 70 - 99 mg/dL  Glucose, capillary     Status: Abnormal   Collection Time: 06/09/20 11:42 AM  Result Value Ref Range   Glucose-Capillary 157 (H) 70 - 99 mg/dL  Glucose, capillary     Status: Abnormal   Collection Time: 06/09/20  3:58 PM  Result Value Ref Range   Glucose-Capillary 223 (H) 70 - 99 mg/dL     ASSESSMENT/PLAN   ACUTE Blood Loss Anemia -Monitor for S/Sx of bleeding -Trend CBC (follow H&H q6h) -SCD's for VTE Prophylaxis (no chemical prophylaxis due to bleed)  -Transfuse for Hgb <8  Acute GI Bleed in setting of Supratherapeutic INR -NPO -Protonix gtt -GI consulted, appreciate input ~ plan for EGD once INR improved -Trend INR ~ INR Goal 2.5-3.5 -Cardiology recommends avoiding vitamin K and allowing INR to drift down naturally; if becomes unstable consider FFP  Hypotension in setting of Hemorrhagic shock +/- Septic Shock Hx: Mechanical Valve on Warfarin, Paroxysmal A-fib, CHF, HTN -Continuous cardiac monitoring -Maintain MAP >65 -Cautious IV Fluids (given CHF hx) -Transfusions as indicated -Vasopressors if needed to maintain MAP goal -Trend lactic acid -Cardiology following appreciate input -Hold home Torsemide, BB, & Spironolactone given Hypotension & AKI  ACUTE KIDNEY INJURY/Renal Failure Anion Gap Metabolic Acidosis in setting of Lactic Acidosis -Monitor I&O's / urinary output -Follow BMP -Ensure adequate renal perfusion -Avoid nephrotoxic agents as able -Replace electrolytes as indicated -Trend lactic acid  UTI -Monitor fever curve -Trend WBC's & Procalcitonin -Follow cultures as above -Place on Rocephin  History of COPD & Asthma without acute exacerbation (chronically on 3L supplemental  O2) Obstructive Sleep [place] -Supplemental O2 as needed to maintain O2 sats 88 to 92% -BiPAP qhs -Follow intermittent CXR & ABG as needed -Prn Bronchodilators  Diabetes Mellitus -CBG's -SSI if needed -Follow ICU Hypo/hyperglycemia protocol -Hold home Metformin & Glipizide      Rufina Falco. DNP, CCRN, FNP-BC [place] during the described time interval was provided by me and/or other providers on the critical care team.  I have reviewed this patient's available data, including medical history, events of note, physical examination and test results as part of my evaluation.

## 2020-06-09 NOTE — Progress Notes (Signed)
GI Inpatient Follow-up Note  Subjective:  Patient seen in follow-up for acute blood loss anemia. No acute overnight events. Hemoglobin 7.6 this morning. INR 2.7. Patient reports no episodes of recurrent rectal bleeding. She reports she is sad that she is here and would like some answers. She was glad her son could visit this morning and this made her happy.   Scheduled Inpatient Medications:  . azaTHIOprine  100 mg Oral Daily  . Chlorhexidine Gluconate Cloth  6 each Topical Daily  . insulin aspart  0-5 Units Subcutaneous QHS  . insulin aspart  0-9 Units Subcutaneous TID WC  . mouth rinse  15 mL Mouth Rinse BID  . [START ON 06/11/2020] pantoprazole  40 mg Intravenous Q12H  . rOPINIRole  3 mg Oral TID  . sodium chloride flush  3 mL Intravenous Q12H    Continuous Inpatient Infusions:   . sodium chloride    . cefTRIAXone (ROCEPHIN)  IV Stopped (06/08/20 2149)  . dextrose 5 % and 0.45 % NaCl with KCl 40 mEq/L 50 mL/hr at 06/09/20 1146  . pantoprozole (PROTONIX) infusion 8 mg/hr (06/09/20 1537)    PRN Inpatient Medications:  sodium chloride, acetaminophen, docusate sodium, ondansetron (ZOFRAN) IV, polyethylene glycol, sodium chloride flush  Review of Systems: Constitutional: Weight is stable.  Eyes: No changes in vision. ENT: No oral lesions, sore throat.  GI: see HPI.  Heme/Lymph: No easy bruising.  CV: No chest pain.  GU: No hematuria.  Integumentary: No rashes.  Neuro: No headaches.  Psych: No depression/anxiety.  Endocrine: No heat/cold intolerance.  Allergic/Immunologic: No urticaria.  Resp: No cough, SOB.  Musculoskeletal: No joint swelling.    Physical Examination: BP 105/65 (BP Location: Right Arm)   Pulse (!) 111   Temp 98.4 F (36.9 C) (Oral)   Resp 14   Ht 5\' 9"  (1.753 m)   Wt 96.4 kg   SpO2 96%   BMI 31.38 kg/m  Gen: NAD, alert and oriented x 4 HEENT: PEERLA, EOMI, Neck: supple, no JVD or thyromegaly Chest: CTA bilaterally, no wheezes, crackles, or other  adventitious sounds CV: RRR, no m/g/c/r Abd: soft, NT, ND, +BS in all four quadrants; no HSM, guarding, ridigity, or rebound tenderness Ext: no edema, well perfused with 2+ pulses, Skin: no rash or lesions noted Lymph: no LAD  Data: Lab Results  Component Value Date   WBC 9.7 06/09/2020   HGB 7.6 (L) 06/09/2020   HCT 22.5 (L) 06/09/2020   MCV 89.3 06/09/2020   PLT 199 06/09/2020   Recent Labs  Lab 06/08/20 0639 06/08/20 1319 06/09/20 0105  HGB 8.2* 7.7* 7.6*   Lab Results  Component Value Date   NA 137 06/09/2020   K 3.1 (L) 06/09/2020   CL 90 (L) 06/09/2020   CO2 34 (H) 06/09/2020   BUN 113 (H) 06/09/2020   CREATININE 1.76 (H) 06/09/2020   Lab Results  Component Value Date   ALT 17 06/07/2020   AST 34 06/07/2020   ALKPHOS 79 06/07/2020   BILITOT 1.1 06/07/2020   Recent Labs  Lab 06/07/20 1242 06/08/20 0639 06/09/20 0105  APTT 42*  --   --   INR 4.6*   < > 2.7*   < > = values in this interval not displayed.   Assessment/Plan: 74 y/o Caucasian female with a PMH ofatrial fibrillation on chronic anticoagulation with Warfarin, COPD, HTN, Hx of interstitial lung disease, ANCA associated vasculitis, Hx of DVT, Hx of mitral valve replacement, and hx of diabetesadmitted for acute blood  loss anemia  1. 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 Hemoglobin dropped 12.0 to 6.1 in a 6 week span, consistent with GIB. Hemoglobin 7.6 this morning.   2. Heme positive stool  3. 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 Supratherapeutic INR - INR 4.6 on admission, cardiology following. INR 2.7  5. Acute renal failure with UTI - hospital following  Recommendations:  1. Pt appears hemodynamically stable currently with no signs of active GIB 2. Agree with transfusions as necessary to ensure Hgb >7.0. 3. Continue Protonix gtt 4. Patient  will need an INR of 1.5 or less for diagnostic and therapeutic EGD for hemostasis. IV heparin will be started. Appreciate cardiology input.  5. Will make NPO after midnight and recheck INR in the morning 6. Hopefully can proceed with EGD tomorrow with Dr. Alice Reichert  I discussed plan of care with patient and her HPOA (Son - Merry Proud) over the telephone today who was appreciative of the call. I reviewed the risks (including bleeding, perforation, infection, anesthesia complications, cardiac/respiratory complications), benefits and alternatives of EGD. Patient and son consent to proceed.     Please call with questions or concerns.    Octavia Bruckner, PA-C [facility] [phone] (Cell)

## 2020-06-10 LAB — GLUCOSE, CAPILLARY
Glucose-Capillary: 141 mg/dL — ABNORMAL HIGH (ref 70–99)
Glucose-Capillary: 178 mg/dL — ABNORMAL HIGH (ref 70–99)
Glucose-Capillary: 135 mg/dL — ABNORMAL HIGH (ref 70–99)
Glucose-Capillary: 127 mg/dL — ABNORMAL HIGH (ref 70–99)

## 2020-06-10 LAB — PROTIME-INR
INR: 1.8 — ABNORMAL HIGH (ref 0.8–1.2)
Prothrombin Time: 20.4 seconds — ABNORMAL HIGH (ref 11.4–15.2)

## 2020-06-10 LAB — RENAL FUNCTION PANEL
Albumin: 3.1 g/dL — ABNORMAL LOW (ref 3.5–5.0)
Anion gap: 7 (ref 5–15)
BUN: 75 mg/dL — ABNORMAL HIGH (ref 8–23)
CO2: 34 mmol/L — ABNORMAL HIGH (ref 22–32)
Calcium: 8.1 mg/dL — ABNORMAL LOW (ref 8.9–10.3)
Chloride: 99 mmol/L (ref 98–111)
Creatinine, Ser: 1.19 mg/dL — ABNORMAL HIGH (ref 0.44–1.00)
GFR calc Af Amer: 52 mL/min — ABNORMAL LOW (ref >60)
GFR calc non Af Amer: 45 mL/min — ABNORMAL LOW (ref >60)
Glucose, Bld: 166 mg/dL — ABNORMAL HIGH (ref 70–99)
Phosphorus: 2.5 mg/dL (ref 2.5–4.6)
Potassium: 3.3 mmol/L — ABNORMAL LOW (ref 3.5–5.1)
Sodium: 140 mmol/L (ref 135–145)

## 2020-06-10 LAB — HEMOGLOBIN AND HEMATOCRIT, BLOOD
HCT: 23.8 % — ABNORMAL LOW (ref 36.0–46.0)
Hemoglobin: 7.6 g/dL — ABNORMAL LOW (ref 12.0–15.0)

## 2020-06-10 LAB — CBC
HCT: 23.1 % — ABNORMAL LOW (ref 36.0–46.0)
Hemoglobin: 7.8 g/dL — ABNORMAL LOW (ref 12.0–15.0)
MCH: 30.6 pg (ref 26.0–34.0)
MCHC: 33.8 g/dL (ref 30.0–36.0)
MCV: 90.6 fL (ref 80.0–100.0)
Platelets: 177 10*3/uL (ref 150–400)
RBC: 2.55 MIL/uL — ABNORMAL LOW (ref 3.87–5.11)
RDW: 17.3 % — ABNORMAL HIGH (ref 11.5–15.5)
WBC: 8.2 10*3/uL (ref 4.0–10.5)
nRBC: 0 % (ref 0.0–0.2)

## 2020-06-10 LAB — HEPARIN LEVEL (UNFRACTIONATED)
Heparin Unfractionated: 0.55 IU/mL (ref 0.30–0.70)
Heparin Unfractionated: 0.58 IU/mL (ref 0.30–0.70)

## 2020-06-10 LAB — MAGNESIUM: Magnesium: 2.5 mg/dL — ABNORMAL HIGH (ref 1.7–2.4)

## 2020-06-10 MED ORDER — POTASSIUM CHLORIDE 10 MEQ/100ML IV SOLN
10.0000 meq | INTRAVENOUS | Status: AC
  Administered 2020-06-10 (×4): 10 meq via INTRAVENOUS
  Filled 2020-06-10 (×4): qty 100

## 2020-06-10 MED ORDER — SODIUM CHLORIDE 0.9% IV SOLUTION: Freq: Once | INTRAVENOUS | Status: DC

## 2020-06-10 MED ORDER — SODIUM CHLORIDE 0.9% FLUSH
10.0000 mL | INTRAVENOUS | Status: DC | PRN
  Administered 2020-06-14: 10 mL

## 2020-06-10 MED ORDER — HEPARIN (PORCINE) 25000 UT/250ML-% IV SOLN: 1000.0000 [IU]/h | INTRAVENOUS | Status: AC

## 2020-06-10 MED ORDER — HEPARIN (PORCINE) 25000 UT/250ML-% IV SOLN: 1000.0000 [IU]/h | INTRAVENOUS | Status: DC

## 2020-06-10 MED ORDER — SODIUM CHLORIDE 0.9% FLUSH
10.0000 mL | Freq: Two times a day (BID) | INTRAVENOUS | Status: DC
  Administered 2020-06-10: 20 mL
  Administered 2020-06-10 – 2020-06-19 (×17): 10 mL

## 2020-06-10 NOTE — Progress Notes (Signed)
[place] for heparin Indication: bridge while warfarin on hold pending procedure  Allergies  Allergen Reactions  . Flecainide Shortness Of Breath and Other (See Comments)    Reaction: dizziness   . Amiodarone Other (See Comments)    Pt states that this medication causes lung bleeding.      Patient Measurements: Height: 5\' 9"  (175.3 cm) Weight: 91.2 kg (201 lb 1 oz) IBW/kg (Calculated) : 66.2 Heparin Dosing Weight: 84.9 kg  Vital Signs: Temp: 97.4 F (36.3 C) (06/19 0500) Temp Source: Oral (06/19 0500) BP: 102/46 (06/19 0200)  Labs: Recent Labs    06/08/20 0639 06/08/20 1319 06/09/20 0105 06/09/20 0105 06/10/20 0121 06/10/20 0453 06/10/20 1319  HGB 8.2*   < > 7.6*   < > 7.6* 7.8*  --   HCT 24.0*   < > 22.5*  --  23.8* 23.1*  --   PLT 241  --  199  --   --  177  --   LABPROT 32.5*  --  27.7*  --   --  20.4*  --   INR 3.3*  --  2.7*  --   --  1.8*  --   HEPARINUNFRC  --   --   --   --   --  0.55 0.58  CREATININE 2.64*  --  1.76*  --   --  1.19*  --   TROPONINIHS  --   --  28*  --   --   --   --    < > = values in this interval not displayed.    Estimated Creatinine Clearance: 50.6 mL/min (A) (by C-G formula based on SCr of 1.19 mg/dL (H)).   Medical History: Past Medical History:  Diagnosis Date  . Acute diastolic heart failure ([place])   . Allergy   . ANCA-associated vasculitis ([place])   . Asthma   . Atrial fibrillation ([place])   . Backache, unspecified   . Cancer ([place])    skin  . Cardiomegaly   . COPD (chronic obstructive pulmonary disease) ([place])   . Diabetes mellitus without complication ([place])   . 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 Esophageal reflux   . Headache(784.0)   . Herpes zoster without mention of complication   . Hx: UTI (urinary tract infection)   . Hypertension    heart controlled w CHF  . Nontoxic uninodular goiter   . Obesity, unspecified   . Osteoarthrosis, unspecified  whether generalized or localized, unspecified site   . Unspecified sleep apnea   . Urine incontinence    hx of    Assessment: 74 year old female with afib on warfarin PTA. Patient also with mechanical mitral valve replacement with goal INR 2.5-3.5. Warfarin has been on hold for endoscopic procedure. Pharmacy has been consulted to start heparin as bridge while continuing to hold warfarin.  6/19 0453 HL 0.55  6/19 1319 HL 0.58   Goal of Therapy:  Heparin level 0.3-0.7 units/ml Monitor platelets by anticoagulation protocol: Yes   Plan:  Heparin level is therapeutic. Will continue current rate and will recheck HL and CBC with AM labs and continue to monitor.  Oswald Hillock, PharmD 06/10/2020,1:59 PM

## 2020-06-10 NOTE — Progress Notes (Signed)
[place] for heparin Indication: bridge while warfarin on hold pending procedure  Allergies  Allergen Reactions  . Flecainide Shortness Of Breath and Other (See Comments)    Reaction: dizziness   . Amiodarone Other (See Comments)    Pt states that this medication causes lung bleeding.      Patient Measurements: Height: 5\' 9"  (175.3 cm) Weight: 91.2 kg (201 lb 1 oz) IBW/kg (Calculated) : 66.2 Heparin Dosing Weight: 84.9 kg  Vital Signs: Temp: 98.8 F (37.1 C) (06/19 1600) Temp Source: Oral (06/19 1600) Pulse Rate: 45 (06/19 1200)  Labs: Recent Labs    06/08/20 0639 06/08/20 1319 06/09/20 0105 06/09/20 0105 06/10/20 0121 06/10/20 0453 06/10/20 1319  HGB 8.2*   < > 7.6*   < > 7.6* 7.8*  --   HCT 24.0*   < > 22.5*  --  23.8* 23.1*  --   PLT 241  --  199  --   --  177  --   LABPROT 32.5*  --  27.7*  --   --  20.4*  --   INR 3.3*  --  2.7*  --   --  1.8*  --   HEPARINUNFRC  --   --   --   --   --  0.55 0.58  CREATININE 2.64*  --  1.76*  --   --  1.19*  --   TROPONINIHS  --   --  28*  --   --   --   --    < > = values in this interval not displayed.    Estimated Creatinine Clearance: 50.6 mL/min (A) (by C-G formula based on SCr of 1.19 mg/dL (H)).   Medical History: Past Medical History:  Diagnosis Date  . Acute diastolic heart failure ([place])   . Allergy   . ANCA-associated vasculitis ([place])   . Asthma   . Atrial fibrillation ([place])   . Backache, unspecified   . Cancer ([place])    skin  . Cardiomegaly   . COPD (chronic obstructive pulmonary disease) ([place])   . Diabetes mellitus without complication ([place])   . 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 Esophageal reflux   . Headache(784.0)   . Herpes zoster without mention of complication   . Hx: UTI (urinary tract infection)   . Hypertension    heart controlled w CHF  . Nontoxic uninodular goiter   . Obesity, unspecified   . Osteoarthrosis,  unspecified whether generalized or localized, unspecified site   . Unspecified sleep apnea   . Urine incontinence    hx of    Assessment: 74 year old female with afib on warfarin PTA. Patient also with mechanical mitral valve replacement with goal INR 2.5-3.5. Warfarin has been on hold for endoscopic procedure. Pharmacy has been consulted to start heparin as bridge while continuing to hold warfarin.  6/19 0453 HL 0.55  6/19 1319 HL 0.58   Goal of Therapy:  Heparin level 0.3-0.7 units/ml Monitor platelets by anticoagulation protocol: Yes   Plan:  06/19 @ 2130 per GI request, will plan to stop heparin drip at 0200 for planned scope at 0800, which will be 6 hours of stop time prior to procedure at 0800 which is 6 half-lives of drug being eliminated from the body to reduce risk of bleeding. Please f/u post procedure.  Tobie Lords, PharmD 06/10/2020,9:33 PM

## 2020-06-10 NOTE — Consult Note (Signed)
PHARMACY CONSULT NOTE  Pharmacy Consult for Electrolyte Monitoring and Replacement   Recent Labs: Potassium (mmol/L)  Date Value  06/10/2020 3.3 (L)  09/27/2014 3.7   Magnesium (mg/dL)  Date Value  06/10/2020 2.5 (H)   Calcium (mg/dL)  Date Value  06/10/2020 8.1 (L)   Albumin (g/dL)  Date Value  06/10/2020 3.1 (L)   Phosphorus (mg/dL)  Date Value  06/10/2020 2.5   Sodium (mmol/L)  Date Value  06/10/2020 140   Corrected Ca: 8.42 mg/dL  Assessment: 74 y/o Caucasian female with a PMH ofatrial fibrillation on chronic anticoagulation with Warfarin, COPD, HTN, Hx of interstitial lung disease, ANCA associated vasculitis, Hx of DVT, Hx of mitral valve replacement, and hx of diabetesadmitted for acute blood loss anemia. Pharmacy is asked to replace electrolytes. MIVF: D5NS at 50 mL/hr  Goal of Therapy:  Potassium 4.0 - 5.1 mmol/L Magnesium 2.0 - 2.4 mg/dL All Other Electrolytes WNL  Plan:   Add 40 mEq/L to MIVF and continue at 50 mL/hr  Medical team ordered KCl 10 mEq IV x 4.   F/U next electrolytes in am 6/20  Oswald Hillock ,PharmD, BCPS Clinical Pharmacist 06/10/2020 8:47 AM

## 2020-06-10 NOTE — H&P (View-Only) (Signed)
GI Inpatient Follow-up Note  Subjective:  Patient seen in follow-up for acute blood loss anemia. No acute overnight events. Patient has not had a bowel movement this morning. INR was 1.8 this morning. IV heparin was started last night. Hemodynamically stable currently with no recurrent bleeding.   Scheduled Inpatient Medications:  . sodium chloride   Intravenous Once  . sodium chloride   Intravenous Once  . sodium chloride   Intravenous Once  . azaTHIOprine  100 mg Oral Daily  . Chlorhexidine Gluconate Cloth  6 each Topical Daily  . insulin aspart  0-5 Units Subcutaneous QHS  . insulin aspart  0-9 Units Subcutaneous TID WC  . mouth rinse  15 mL Mouth Rinse BID  . [START ON 06/11/2020] pantoprazole  40 mg Intravenous Q12H  . rOPINIRole  3 mg Oral TID  . sodium chloride flush  3 mL Intravenous Q12H    Continuous Inpatient Infusions:   . sodium chloride    . cefTRIAXone (ROCEPHIN)  IV 1 g (06/09/20 2245)  . dextrose 5 % and 0.45 % NaCl with KCl 40 mEq/L 50 mL/hr at 06/09/20 1146  . heparin 1,000 Units/hr (06/09/20 1945)  . pantoprozole (PROTONIX) infusion 8 mg/hr (06/10/20 0221)  . potassium chloride      PRN Inpatient Medications:  sodium chloride, acetaminophen, docusate sodium, ondansetron (ZOFRAN) IV, oxyCODONE-acetaminophen, polyethylene glycol, sodium chloride flush  Review of Systems: Constitutional: Weight is stable.  Eyes: No changes in vision. ENT: No oral lesions, sore throat.  GI: see HPI.  Heme/Lymph: No easy bruising.  CV: No chest pain.  GU: No hematuria.  Integumentary: No rashes.  Neuro: No headaches.  Psych: No depression/anxiety.  Endocrine: No heat/cold intolerance.  Allergic/Immunologic: No urticaria.  Resp: No cough, SOB.  Musculoskeletal: No joint swelling.    Physical Examination: BP (!) 102/46   Pulse 78   Temp (!) 97.4 F (36.3 C) (Oral)   Resp 17   Ht 5\' 9"  (1.753 m)   Wt 91.2 kg   SpO2 100%   BMI 29.69 kg/m  Gen: NAD, alert and  oriented x 4 HEENT: PEERLA, EOMI, Neck: supple, no JVD or thyromegaly Chest: CTA bilaterally, no wheezes, crackles, or other adventitious sounds CV: RRR, no m/g/c/r Abd: soft, NT, ND, +BS in all four quadrants; no HSM, guarding, ridigity, or rebound tenderness Ext: no edema, well perfused with 2+ pulses, Skin: no rash or lesions noted Lymph: no LAD  Data: Lab Results  Component Value Date   WBC 8.2 06/10/2020   HGB 7.8 (L) 06/10/2020   HCT 23.1 (L) 06/10/2020   MCV 90.6 06/10/2020   PLT 177 06/10/2020   Recent Labs  Lab 06/09/20 0105 06/10/20 0121 06/10/20 0453  HGB 7.6* 7.6* 7.8*   Lab Results  Component Value Date   NA 140 06/10/2020   K 3.3 (L) 06/10/2020   CL 99 06/10/2020   CO2 34 (H) 06/10/2020   BUN 75 (H) 06/10/2020   CREATININE 1.19 (H) 06/10/2020   Lab Results  Component Value Date   ALT 17 06/07/2020   AST 34 06/07/2020   ALKPHOS 79 06/07/2020   BILITOT 1.1 06/07/2020   Recent Labs  Lab 06/07/20 1242 06/08/20 0639 06/10/20 0453  APTT 42*  --   --   INR 4.6*   < > 1.8*   < > = values in this interval not displayed.   Assessment/Plan:  74 y/o Caucasian female with a PMH ofatrial fibrillation on chronic anticoagulation with Warfarin, COPD, HTN, Hx of  interstitial lung disease, ANCA associated vasculitis, Hx of DVT, Hx of mitral valve replacement, and hx of diabetesadmitted for acute blood loss anemia  1. 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 Hemoglobin dropped 12.0 to 6.1 in a 6 week span, consistent with GIB. Hemoglobin 7.6 this morning.   2. Heme positive stool  3. 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 Supratherapeutic INR - INR 4.6 on admission, cardiology following. Heparin started yesterday and INR 1.8 today. Pharmacy assisting with monitoring of heparin.   5.Acute renal failure with UTI-  hospital following  Recommendations:  1. Stable from a GI standpoint with no precipitous drop in hemoglobin and no signs of overt gastrointestinal blood loss 2. Appreciate pharmacy assistance with IV heparin 3. Plan for EGD tomorrow with goal INR of 1.5 or less. Stop IV Heparin two hours before procedure 4. Heart healthy diet today and NPO after midnight 5. INR tomorrow morning  I reviewed the risks (including bleeding, perforation, infection, anesthesia complications, cardiac/respiratory complications), benefits and alternatives of EGD. Patient and son consent to proceed.    Please call with questions or concerns.   Octavia Bruckner, PA-C [facility] [phone] (Cell)

## 2020-06-10 NOTE — Progress Notes (Signed)
[place] for heparin Indication: bridge while warfarin on hold pending procedure  Allergies  Allergen Reactions  . Flecainide Shortness Of Breath and Other (See Comments)    Reaction: dizziness   . Amiodarone Other (See Comments)    Pt states that this medication causes lung bleeding.      Patient Measurements: Height: 5\' 9"  (175.3 cm) Weight: 91.2 kg (201 lb 1 oz) IBW/kg (Calculated) : 66.2 Heparin Dosing Weight: 84.9 kg  Vital Signs: Temp: 98.8 F (37.1 C) (06/19 1600) Temp Source: Oral (06/19 1600) BP: 103/52 (06/19 2200) Pulse Rate: 77 (06/19 2200)  Labs: Recent Labs    06/08/20 0639 06/08/20 1319 06/09/20 0105 06/09/20 0105 06/10/20 0121 06/10/20 0453 06/10/20 1319  HGB 8.2*   < > 7.6*   < > 7.6* 7.8*  --   HCT 24.0*   < > 22.5*  --  23.8* 23.1*  --   PLT 241  --  199  --   --  177  --   LABPROT 32.5*  --  27.7*  --   --  20.4*  --   INR 3.3*  --  2.7*  --   --  1.8*  --   HEPARINUNFRC  --   --   --   --   --  0.55 0.58  CREATININE 2.64*  --  1.76*  --   --  1.19*  --   TROPONINIHS  --   --  28*  --   --   --   --    < > = values in this interval not displayed.    Estimated Creatinine Clearance: 50.6 mL/min (A) (by C-G formula based on SCr of 1.19 mg/dL (H)).   Medical History: Past Medical History:  Diagnosis Date  . Acute diastolic heart failure ([place])   . Allergy   . ANCA-associated vasculitis ([place])   . Asthma   . Atrial fibrillation ([place])   . Backache, unspecified   . Cancer ([place])    skin  . Cardiomegaly   . COPD (chronic obstructive pulmonary disease) ([place])   . Diabetes mellitus without complication ([place])   . 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 Esophageal reflux   . Headache(784.0)   . Herpes zoster without mention of complication   . Hx: UTI (urinary tract infection)   . Hypertension    heart controlled w CHF  . Nontoxic uninodular goiter   . Obesity, unspecified    . Osteoarthrosis, unspecified whether generalized or localized, unspecified site   . Unspecified sleep apnea   . Urine incontinence    hx of    Assessment: 74 year old female with afib on warfarin PTA. Patient also with mechanical mitral valve replacement with goal INR 2.5-3.5. Warfarin has been on hold for endoscopic procedure. Pharmacy has been consulted to start heparin as bridge while continuing to hold warfarin.  6/19 0453 HL 0.55  6/19 1319 HL 0.58   Goal of Therapy:  Heparin level 0.3-0.7 units/ml Monitor platelets by anticoagulation protocol: Yes   Plan:  06/19 @ 2130 per GI request, will plan to stop heparin drip at 0200 for planned scope at 0800, which will be 4 hours of stop time prior to procedure at 0800 which is roughly 4 half-lives of drug being eliminated from the body to reduce risk of bleeding. Please f/u post procedure.  Tobie Lords, PharmD 06/10/2020,10:12 PM

## 2020-06-10 NOTE — Progress Notes (Signed)
Physical Therapy Evaluation Patient Details Name: Betty [name] MRN: [id number] DOB: Jul 05, 1946 Today's Date: 06/10/2020   History of Present Illness  per MD Note:74 yo WF with Acute Blood Loss Anemiadue to GIB with super therapuetic INR from Warfarin Therapy (therapy for Mechanical Valve) associated with severe acute renal failure, along with UTI.  Clinical Impression  Patient agrees to PT evaluation. She has 3/5 strength  B hips and knees. She performs supine to sit with MI , and transfers with MI sit to stand with HHA. She is incontinent during standing sit to stand. She has good sitting and standing balance . She will continue to benefit from skilled PT to improve mobility and strength.     Follow Up Recommendations SNF    Equipment Recommendations  Rolling walker with 5" wheels    Recommendations for Other Services       Precautions / Restrictions Precautions Precautions: Fall Restrictions Weight Bearing Restrictions: No      Mobility  Bed Mobility Overal bed mobility: Modified Independent             General bed mobility comments: cues for safety  Transfers Overall transfer level: Needs assistance Equipment used: 1 person hand held assist Transfers: Sit to/from Stand Sit to Stand: Min assist         General transfer comment:  (Patient urinated on the floor during stanidng)  Ambulation/Gait Ambulation/Gait assistance:  (NT)              Stairs            Wheelchair Mobility    Modified Rankin (Stroke Patients Only)       Balance Overall balance assessment: Modified Independent                                           Pertinent Vitals/Pain Pain Assessment: Faces Faces Pain Scale: Hurts little more Pain Location:  (back) Pain Intervention(s): Limited activity within patient's tolerance;Monitored during session    Home Living Family/patient expects to be discharged to:: Unsure                       Prior Function Level of Independence: Independent               Hand Dominance        Extremity/Trunk Assessment   Upper Extremity Assessment Upper Extremity Assessment: Overall WFL for tasks assessed    Lower Extremity Assessment Lower Extremity Assessment: RLE deficits/detail;LLE deficits/detail RLE Deficits / Details:  (3/5 hip and knee) LLE Deficits / Details:  (3/5 hip and knee)       Communication   Communication: No difficulties  Cognition Arousal/Alertness: Awake/alert Behavior During Therapy: WFL for tasks assessed/performed Overall Cognitive Status: Within Functional Limits for tasks assessed                                        General Comments      Exercises     Assessment/Plan    PT Assessment Patient needs continued PT services  PT Problem List Decreased strength;Decreased activity tolerance;Decreased mobility       PT Treatment Interventions Gait training;Therapeutic activities;Therapeutic exercise    PT Goals (Current goals can be found in the Care Plan section)  Acute Rehab PT Goals  Patient Stated Goal: no goals stated PT Goal Formulation: Patient unable to participate in goal setting Time For Goal Achievement: 06/24/20 Potential to Achieve Goals: Fair    Frequency Min 2X/week   Barriers to discharge Decreased caregiver support      Co-evaluation               AM-PAC PT "6 Clicks" Mobility  Outcome Measure Help needed turning from your back to your side while in a flat bed without using bedrails?: A Little Help needed moving from lying on your back to sitting on the side of a flat bed without using bedrails?: A Little Help needed moving to and from a bed to a chair (including a wheelchair)?: A Little Help needed standing up from a chair using your arms (e.g., wheelchair or bedside chair)?: A Lot Help needed to walk in hospital room?: A Lot Help needed climbing 3-5 steps with a railing? : Total 6 Click  Score: 14    End of Session Equipment Utilized During Treatment: Gait belt Activity Tolerance: Patient tolerated treatment well Patient left: in bed;with family/visitor present;Other (comment) Nurse Communication:  (nsg notified pateint needing personal care,incontinence ) PT Visit Diagnosis: Unsteadiness on feet (R26.81);Muscle weakness (generalized) (M62.81);Difficulty in walking, not elsewhere classified (R26.2)    Time: 1600-1630 PT Time Calculation (min) (ACUTE ONLY): 30 min   Charges:   PT Evaluation $PT Eval Low Complexity: 1 Low PT Treatments $Therapeutic Activity: 8-22 mins          Alanson Puls, PT DPT 06/10/2020, 4:49 PM

## 2020-06-10 NOTE — Progress Notes (Signed)
GI Inpatient Follow-up Note  Subjective:  Patient seen in follow-up for acute blood loss anemia. No acute overnight events. Patient has not had a bowel movement this morning. INR was 1.8 this morning. IV heparin was started last night. Hemodynamically stable currently with no recurrent bleeding.   Scheduled Inpatient Medications:  . sodium chloride   Intravenous Once  . sodium chloride   Intravenous Once  . sodium chloride   Intravenous Once  . azaTHIOprine  100 mg Oral Daily  . Chlorhexidine Gluconate Cloth  6 each Topical Daily  . insulin aspart  0-5 Units Subcutaneous QHS  . insulin aspart  0-9 Units Subcutaneous TID WC  . mouth rinse  15 mL Mouth Rinse BID  . [START ON 06/11/2020] pantoprazole  40 mg Intravenous Q12H  . rOPINIRole  3 mg Oral TID  . sodium chloride flush  3 mL Intravenous Q12H    Continuous Inpatient Infusions:   . sodium chloride    . cefTRIAXone (ROCEPHIN)  IV 1 g (06/09/20 2245)  . dextrose 5 % and 0.45 % NaCl with KCl 40 mEq/L 50 mL/hr at 06/09/20 1146  . heparin 1,000 Units/hr (06/09/20 1945)  . pantoprozole (PROTONIX) infusion 8 mg/hr (06/10/20 0221)  . potassium chloride      PRN Inpatient Medications:  sodium chloride, acetaminophen, docusate sodium, ondansetron (ZOFRAN) IV, oxyCODONE-acetaminophen, polyethylene glycol, sodium chloride flush  Review of Systems: Constitutional: Weight is stable.  Eyes: No changes in vision. ENT: No oral lesions, sore throat.  GI: see HPI.  Heme/Lymph: No easy bruising.  CV: No chest pain.  GU: No hematuria.  Integumentary: No rashes.  Neuro: No headaches.  Psych: No depression/anxiety.  Endocrine: No heat/cold intolerance.  Allergic/Immunologic: No urticaria.  Resp: No cough, SOB.  Musculoskeletal: No joint swelling.    Physical Examination: BP (!) 102/46   Pulse 78   Temp (!) 97.4 F (36.3 C) (Oral)   Resp 17   Ht 5\' 9"  (1.753 m)   Wt 91.2 kg   SpO2 100%   BMI 29.69 kg/m  Gen: NAD, alert and  oriented x 4 HEENT: PEERLA, EOMI, Neck: supple, no JVD or thyromegaly Chest: CTA bilaterally, no wheezes, crackles, or other adventitious sounds CV: RRR, no m/g/c/r Abd: soft, NT, ND, +BS in all four quadrants; no HSM, guarding, ridigity, or rebound tenderness Ext: no edema, well perfused with 2+ pulses, Skin: no rash or lesions noted Lymph: no LAD  Data: Lab Results  Component Value Date   WBC 8.2 06/10/2020   HGB 7.8 (L) 06/10/2020   HCT 23.1 (L) 06/10/2020   MCV 90.6 06/10/2020   PLT 177 06/10/2020   Recent Labs  Lab 06/09/20 0105 06/10/20 0121 06/10/20 0453  HGB 7.6* 7.6* 7.8*   Lab Results  Component Value Date   NA 140 06/10/2020   K 3.3 (L) 06/10/2020   CL 99 06/10/2020   CO2 34 (H) 06/10/2020   BUN 75 (H) 06/10/2020   CREATININE 1.19 (H) 06/10/2020   Lab Results  Component Value Date   ALT 17 06/07/2020   AST 34 06/07/2020   ALKPHOS 79 06/07/2020   BILITOT 1.1 06/07/2020   Recent Labs  Lab 06/07/20 1242 06/08/20 0639 06/10/20 0453  APTT 42*  --   --   INR 4.6*   < > 1.8*   < > = values in this interval not displayed.   Assessment/Plan:  74 y/o Caucasian female with a PMH ofatrial fibrillation on chronic anticoagulation with Warfarin, COPD, HTN, Hx of  interstitial lung disease, ANCA associated vasculitis, Hx of DVT, Hx of mitral valve replacement, and hx of diabetesadmitted for acute blood loss anemia  1. 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 Hemoglobin dropped 12.0 to 6.1 in a 6 week span, consistent with GIB. Hemoglobin 7.6 this morning.   2. Heme positive stool  3. 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 Supratherapeutic INR - INR 4.6 on admission, cardiology following. Heparin started yesterday and INR 1.8 today. Pharmacy assisting with monitoring of heparin.   5.Acute renal failure with UTI-  hospital following  Recommendations:  1. Stable from a GI standpoint with no precipitous drop in hemoglobin and no signs of overt gastrointestinal blood loss 2. Appreciate pharmacy assistance with IV heparin 3. Plan for EGD tomorrow with goal INR of 1.5 or less. Stop IV Heparin two hours before procedure 4. Heart healthy diet today and NPO after midnight 5. INR tomorrow morning  I reviewed the risks (including bleeding, perforation, infection, anesthesia complications, cardiac/respiratory complications), benefits and alternatives of EGD. Patient and son consent to proceed.    Please call with questions or concerns.   Octavia Bruckner, PA-C [facility] [phone] (Cell)

## 2020-06-10 NOTE — Progress Notes (Signed)
[place] for heparin Indication: bridge while warfarin on hold pending procedure  Allergies  Allergen Reactions  . Flecainide Shortness Of Breath and Other (See Comments)    Reaction: dizziness   . Amiodarone Other (See Comments)    Pt states that this medication causes lung bleeding.      Patient Measurements: Height: 5\' 9"  (175.3 cm) Weight: 91.2 kg (201 lb 1 oz) IBW/kg (Calculated) : 66.2 Heparin Dosing Weight: 84.9 kg  Vital Signs: Temp: 97.4 F (36.3 C) (06/19 0500) Temp Source: Oral (06/19 0500) BP: 102/46 (06/19 0200) Pulse Rate: 78 (06/19 0103)  Labs: Recent Labs     0000 06/07/20 1242 06/07/20 1310 06/07/20 2053 06/08/20 0639 06/08/20 1319 06/09/20 0105 06/09/20 0105 06/10/20 0121 06/10/20 0453  HGB  --  6.1*  --    < > 8.2*   < > 7.6*   < > 7.6* 7.8*  HCT  --  18.6*  --    < > 24.0*   < > 22.5*  --  23.8* 23.1*  PLT   < > 325  --   --  241  --  199  --   --  177  APTT  --  42*  --   --   --   --   --   --   --   --   LABPROT   < > 41.8*  --   --  32.5*  --  27.7*  --   --  20.4*  INR   < > 4.6*  --   --  3.3*  --  2.7*  --   --  1.8*  HEPARINUNFRC  --   --   --   --   --   --   --   --   --  0.55  CREATININE   < > 2.67*  --   --  2.64*  --  1.76*  --   --  1.19*  TROPONINIHS  --   --  30*  --   --   --  28*  --   --   --    < > = values in this interval not displayed.    Estimated Creatinine Clearance: 50.6 mL/min (A) (by C-G formula based on SCr of 1.19 mg/dL (H)).   Medical History: Past Medical History:  Diagnosis Date  . Acute diastolic heart failure ([place])   . Allergy   . ANCA-associated vasculitis ([place])   . Asthma   . Atrial fibrillation ([place])   . Backache, unspecified   . Cancer ([place])    skin  . Cardiomegaly   . COPD (chronic obstructive pulmonary disease) ([place])   . Diabetes mellitus without complication ([place])   . 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 Esophageal  reflux   . Headache(784.0)   . Herpes zoster without mention of complication   . Hx: UTI (urinary tract infection)   . Hypertension    heart controlled w CHF  . Nontoxic uninodular goiter   . Obesity, unspecified   . Osteoarthrosis, unspecified whether generalized or localized, unspecified site   . Unspecified sleep apnea   . Urine incontinence    hx of    Assessment: 74 year old female with afib on warfarin PTA. Patient also with mechanical mitral valve replacement with goal INR 2.5-3.5. Warfarin has been on hold for endoscopic procedure. Pharmacy has been consulted to start heparin as bridge  while continuing to hold warfarin.  Goal of Therapy:  Heparin level 0.3-0.7 units/ml Monitor platelets by anticoagulation protocol: Yes   Plan:  06/19 @ 0500 HL 0.55 therapeutic. Will continue current rate and will recheck HL at 1300 and continue to monitor.  Tobie Lords, PharmD 06/10/2020,5:58 AM

## 2020-06-10 NOTE — Progress Notes (Signed)
Follow up - Critical Care Medicine Note  Patient Details:    Betty [name] is an 74 y.o. female admitted with Acute Blood Loss Anemia due to GIB with super therapuetic INR from Warfarin Therapy (therapy for Mechanical Valve) associated with  severe acute renal failure, along with UTI.   Lines/tubes : External Urinary Catheter (Active)  Collection Container Dedicated Suction Canister 06/09/20 1556  Securement Method Tape 06/09/20 1556  Site Assessment Clean;Intact;Dry 06/09/20 1556  Intervention Equipment Changed 06/09/20 1556  Output (mL) 250 mL 06/09/20 1646    Microbiology/Sepsis markers: Results for orders placed or performed during the hospital encounter of 06/07/20  SARS Coronavirus 2 by RT PCR (hospital order, performed in [facility] hospital lab) Nasopharyngeal Nasopharyngeal Swab     Status: None   Collection Time: 06/07/20  1:34 PM   Specimen: Nasopharyngeal Swab  Result Value Ref Range Status   SARS Coronavirus 2 NEGATIVE NEGATIVE Final    Comment: (NOTE) SARS-CoV-2 target nucleic acids are NOT DETECTED.  The SARS-CoV-2 RNA is generally detectable in upper and lower respiratory specimens during the acute phase of infection. The lowest concentration of SARS-CoV-2 viral copies this assay can detect is 250 copies / mL. A negative result does not preclude SARS-CoV-2 infection and should not be used as the sole basis for treatment or other patient management decisions.  A negative result may occur with improper specimen collection / handling, submission of specimen other than nasopharyngeal swab, presence of viral mutation(s) within the areas targeted by this assay, and inadequate number of viral copies (<250 copies / mL). A negative result must be combined with clinical observations, patient history, and epidemiological information.  Fact Sheet for Patients:   StrictlyIdeas.no  Fact Sheet for Healthcare  Providers: BankingDealers.co.za  This test is not yet approved or  cleared by the [place] FDA and has been authorized for detection and/or diagnosis of SARS-CoV-2 by FDA under an Emergency Use Authorization (EUA).  This EUA will remain in effect (meaning this test can be used) for the duration of the COVID-19 declaration under Section 564(b)(1) of the Act, 21 U.S.C. section 360bbb-3(b)(1), unless the authorization is terminated or revoked sooner.  Performed at [facility], [address] [place], [place] [address]   Blood Culture (routine x 2)     Status: None (Preliminary result)   Collection Time: 06/07/20  5:24 PM   Specimen: BLOOD  Result Value Ref Range Status   Specimen Description BLOOD BLOOD RIGHT HAND  Final   Special Requests   Final    BOTTLES DRAWN AEROBIC AND ANAEROBIC Blood Culture adequate volume   Culture   Final    NO GROWTH 3 DAYS Performed at [facility], [address] [place], [place] [address]    Report Status PENDING  Incomplete  Urine culture     Status: Abnormal   Collection Time: 06/07/20  5:24 PM   Specimen: Urine, Clean Catch  Result Value Ref Range Status   Specimen Description   Final    URINE, CLEAN CATCH Performed at [facility], [address], [place], [place] [address]    Special Requests   Final    NONE Performed at [facility], [address] [place], [place] [address]    Culture >=100,000 COLONIES/mL KLEBSIELLA PNEUMONIAE (A)  Final   Report Status 06/09/2020 FINAL  Final   Organism ID, Bacteria KLEBSIELLA PNEUMONIAE (A)  Final      Susceptibility   Klebsiella pneumoniae - MIC*  AMPICILLIN RESISTANT Resistant     CEFAZOLIN <=4 SENSITIVE Sensitive     CEFTRIAXONE <=0.25 SENSITIVE Sensitive     CIPROFLOXACIN <=0.25 SENSITIVE Sensitive     GENTAMICIN <=1 SENSITIVE Sensitive     IMIPENEM <=0.25 SENSITIVE Sensitive     NITROFURANTOIN 64 INTERMEDIATE  Intermediate     TRIMETH/SULFA <=20 SENSITIVE Sensitive     AMPICILLIN/SULBACTAM 4 SENSITIVE Sensitive     PIP/TAZO <=4 SENSITIVE Sensitive     * >=100,000 COLONIES/mL KLEBSIELLA PNEUMONIAE  MRSA PCR Screening     Status: None   Collection Time: 06/07/20  6:25 PM   Specimen: Nasopharyngeal  Result Value Ref Range Status   MRSA by PCR NEGATIVE NEGATIVE Final    Comment:        The GeneXpert MRSA Assay (FDA approved for NASAL specimens only), is one component of a comprehensive MRSA colonization surveillance program. It is not intended to diagnose MRSA infection nor to guide or monitor treatment for MRSA infections. Performed at [facility], [address] [place], [place] [address]     Anti-infectives:  Anti-infectives (From admission, onward)   Start     Dose/Rate Route Frequency Ordered Stop   06/07/20 2100  cefTRIAXone (ROCEPHIN) 1 g in sodium chloride 0.9 % 100 mL IVPB     Discontinue     1 g 200 mL/hr over 30 Minutes Intravenous Every 24 hours 06/07/20 1938 06/14/20 2059     Scheduled Meds: . sodium chloride   Intravenous Once  . sodium chloride   Intravenous Once  . sodium chloride   Intravenous Once  . azaTHIOprine  100 mg Oral Daily  . Chlorhexidine Gluconate Cloth  6 each Topical Daily  . insulin aspart  0-5 Units Subcutaneous QHS  . insulin aspart  0-9 Units Subcutaneous TID WC  . mouth rinse  15 mL Mouth Rinse BID  . [START ON 06/11/2020] pantoprazole  40 mg Intravenous Q12H  . rOPINIRole  3 mg Oral TID  . sodium chloride flush  10-40 mL Intracatheter Q12H  . sodium chloride flush  3 mL Intravenous Q12H   Continuous Infusions: . sodium chloride    . cefTRIAXone (ROCEPHIN)  IV 1 g (06/10/20 2112)  . dextrose 5 % and 0.45 % NaCl with KCl 40 mEq/L 50 mL/hr at 06/10/20 1144  . heparin 1,000 Units/hr (06/10/20 2145)   PRN Meds:.sodium chloride, acetaminophen, docusate sodium, ondansetron (ZOFRAN) IV, oxyCODONE-acetaminophen, polyethylene glycol,  sodium chloride flush, sodium chloride flush  Best Practice/Protocols:  VTE px: Holding due to GI bleed SUP px:IV Protonix Diet:Regular Diet  Consults: Treatment Team:  Teodoro Spray, MD Efrain Sella, MD Isaias Cowman, MD    Studies: DG Chest Port 1 View  Result Date: 06/08/2020 CLINICAL DATA:  Shortness of breath, interval changes EXAM: PORTABLE CHEST 1 VIEW COMPARISON:  August 05, 2019 FINDINGS: The heart size and mediastinal contours are unchanged with cardiomegaly. A left-sided pacemaker is again noted. Prosthetic mitral valve and overlying median sternotomy wires are present. There is diffusely increased interstitial markings seen throughout both lungs as on the prior exam. There is also prominence of the central pulmonary vasculature. IMPRESSION: Cardiomegaly and pulmonary vascular congestion. Diffusely increased interstitial markings seen throughout both lungs, likely due to chronic lung changes. Electronically Signed   By: Prudencio Pair M.D.   On: 06/08/2020 04:21    SIGNIFICANT EVENTS 6/16: Admitted to ICU   CC  Follow up INR   Subjective:    Overnight Issues: Intermittent confusion but easily  re-oriented  Objective:  Vital signs for last 24 hours: Temp:  [97.4 F (36.3 C)-98.8 F (37.1 C)] 98.6 F (37 C) (06/19 2100) Pulse Rate:  [45-78] 77 (06/19 2200) Resp:  [16-25] 16 (06/19 2200) BP: (83-103)/(46-71) 103/52 (06/19 2200) SpO2:  [92 %-100 %] 99 % (06/19 2200) Weight:  [91.2 kg] 91.2 kg (06/19 0500)  Hemodynamic parameters for last 24 hours:    Intake/Output from previous day: 06/18 0701 - 06/19 0700 In: 1566.6 [I.V.:1116.6; Blood:350; IV Piggyback:100] Out: 3250 [Urine:3250]  Intake/Output this shift: No intake/output data recorded.  Vent settings for last 24 hours:    PHYSICAL EXAMINATION General Appearance: Cchronically ill appearing female, sitting in bed, on chronic 3L [place], in No acute distress  Neuro:Atraumatic,  normocephalic, neck supple,no JVD,  speech normal,  HEENT: PERRLA, no icterus Pulmonary: normal breath sounds, No wheezing.  CardiovascularNormal S1,S2.  No m/r/g.    Valve sounds crisp. Abdomen: Benign, Soft, non-tender, nondistended, no guarding or rebound tenderness Renal:  No costovertebral tenderness  GU:  Not performed at this time. Endoc: No evident thyromegaly Skin:   warm, no rashes, no ecchymosis  Extremities: normal, no cyanosis, clubbing. PSYCHIATRIC: Mood, affect within normal limits.  Results for orders placed or performed during the hospital encounter of 06/07/20 (from the past 24 hour(s))  Hemoglobin and hematocrit, blood     Status: Abnormal   Collection Time: 06/10/20  1:21 AM  Result Value Ref Range   Hemoglobin 7.6 (L) 12.0 - 15.0 g/dL   HCT 23.8 (L) 36 - 46 %  Prepare RBC (crossmatch)     Status: None   Collection Time: 06/10/20  2:30 AM  Result Value Ref Range   Order Confirmation      ORDER PROCESSED BY BLOOD BANK Performed at [facility], [address] [place], [place] [address]   Heparin level (unfractionated)     Status: None   Collection Time: 06/10/20  4:53 AM  Result Value Ref Range   Heparin Unfractionated 0.55 0.30 - 0.70 IU/mL  CBC     Status: Abnormal   Collection Time: 06/10/20  4:53 AM  Result Value Ref Range   WBC 8.2 4.0 - 10.5 K/uL   RBC 2.55 (L) 3.87 - 5.11 MIL/uL   Hemoglobin 7.8 (L) 12.0 - 15.0 g/dL   HCT 23.1 (L) 36 - 46 %   MCV 90.6 80.0 - 100.0 fL   MCH 30.6 26.0 - 34.0 pg   MCHC 33.8 30.0 - 36.0 g/dL   RDW 17.3 (H) 11.5 - 15.5 %   Platelets 177 150 - 400 K/uL   nRBC 0.0 0.0 - 0.2 %  Magnesium     Status: Abnormal   Collection Time: 06/10/20  4:53 AM  Result Value Ref Range   Magnesium 2.5 (H) 1.7 - 2.4 mg/dL  Protime-INR     Status: Abnormal   Collection Time: 06/10/20  4:53 AM  Result Value Ref Range   Prothrombin Time 20.4 (H) 11.4 - 15.2 seconds   INR 1.8 (H) 0.8 - 1.2  Renal function panel     Status:  Abnormal   Collection Time: 06/10/20  4:53 AM  Result Value Ref Range   Sodium 140 135 - 145 mmol/L   Potassium 3.3 (L) 3.5 - 5.1 mmol/L   Chloride 99 98 - 111 mmol/L   CO2 34 (H) 22 - 32 mmol/L   Glucose, Bld 166 (H) 70 - 99 mg/dL   BUN 75 (H) 8 - 23 mg/dL  Creatinine, Ser 1.19 (H) 0.44 - 1.00 mg/dL   Calcium 8.1 (L) 8.9 - 10.3 mg/dL   Phosphorus 2.5 2.5 - 4.6 mg/dL   Albumin 3.1 (L) 3.5 - 5.0 g/dL   GFR calc non Af Amer 45 (L) >60 mL/min   GFR calc Af Amer 52 (L) >60 mL/min   Anion gap 7 5 - 15  Glucose, capillary     Status: Abnormal   Collection Time: 06/10/20  8:00 AM  Result Value Ref Range   Glucose-Capillary 141 (H) 70 - 99 mg/dL  Glucose, capillary     Status: Abnormal   Collection Time: 06/10/20 11:49 AM  Result Value Ref Range   Glucose-Capillary 178 (H) 70 - 99 mg/dL  Heparin level (unfractionated)     Status: None   Collection Time: 06/10/20  1:19 PM  Result Value Ref Range   Heparin Unfractionated 0.58 0.30 - 0.70 IU/mL  Glucose, capillary     Status: Abnormal   Collection Time: 06/10/20  4:59 PM  Result Value Ref Range   Glucose-Capillary 135 (H) 70 - 99 mg/dL  Glucose, capillary     Status: Abnormal   Collection Time: 06/10/20  9:24 PM  Result Value Ref Range   Glucose-Capillary 127 (H) 70 - 99 mg/dL   Comment 1 Document in Chart      ASSESSMENT/PLAN   ACUTE Blood Loss Anemia -Monitor for S/Sx of bleeding -Trend CBC (follow H&H q6h) -SCD's for VTE Prophylaxis  -Transfuse for Hgb <8  Acute GI Bleed in setting of Supratherapeutic INR -NPO -Protonix gtt -GI consulted, appreciate input ~ plan for EGD once INR improved -Trend INR ~ INR Goal 2.5-3.5 -Cardiology recommends avoiding vitamin K and allowing INR to drift down naturally; if becomes unstable consider FFP  Hypotension in setting of Hemorrhagic shock +/- Septic Shock Hx: Mechanical Valve on Warfarin, Paroxysmal A-fib, CHF, HTN -Continuous cardiac monitoring -Maintain MAP >65 -Cautious IV  Fluids (given CHF hx) -Transfusions as indicated -Vasopressors if needed to maintain MAP goal -Trend lactic acid -Cardiology following appreciate input -Hold home Torsemide, BB, & Spironolactone given Hypotension & AKI  ACUTE KIDNEY INJURY/Renal Failure Anion Gap Metabolic Acidosis in setting of Lactic Acidosis -Monitor I&O's / urinary output -Follow BMP -Ensure adequate renal perfusion -Avoid nephrotoxic agents as able -Replace electrolytes as indicated -Trend lactic acid  UTI -Monitor fever curve -Trend WBC's & Procalcitonin -Follow cultures as above -Continue  Rocephin  History of COPD & Asthma without acute exacerbation (chronically on 3L supplemental O2) Obstructive Sleep [place] -Supplemental O2 as needed to maintain O2 sats 88 to 92% -BiPAP qhs -Follow intermittent CXR & ABG as needed -PRN Bronchodilators  Diabetes Mellitus -CBG's -SSI if needed -Follow ICU Hypo/hyperglycemia protocol -Hold home Metformin & Glipizide      C. Derrill Kay, MD [facility]   **This note was dictated using voice recognition software/Dragon.  Despite best efforts to proofread, errors can occur which can change the meaning.  Any change was purely unintentional.

## 2020-06-11 ENCOUNTER — Encounter
Disposition: A | Discharge status: Skilled Nursing Facility | Payer: Self-pay | Source: Home / Self Care | Attending: Internal Medicine

## 2020-06-11 ENCOUNTER — Inpatient Hospital Stay: Payer: Medicare PPO | Admitting: Anesthesiology

## 2020-06-11 ENCOUNTER — Encounter: Payer: Self-pay | Admitting: Internal Medicine

## 2020-06-11 ENCOUNTER — Inpatient Hospital Stay: Payer: Medicare PPO

## 2020-06-11 HISTORY — PX: ESOPHAGOGASTRODUODENOSCOPY: SHX5428

## 2020-06-11 LAB — TYPE AND SCREEN
ABO/RH(D): A POS
Antibody Screen: NEGATIVE
Unit division: 0
Unit division: 0
Unit division: 0
Unit division: 0

## 2020-06-11 LAB — BASIC METABOLIC PANEL
Anion gap: 3 — ABNORMAL LOW (ref 5–15)
BUN: 37 mg/dL — ABNORMAL HIGH (ref 8–23)
CO2: 33 mmol/L — ABNORMAL HIGH (ref 22–32)
Calcium: 8 mg/dL — ABNORMAL LOW (ref 8.9–10.3)
Chloride: 103 mmol/L (ref 98–111)
Creatinine, Ser: 0.91 mg/dL (ref 0.44–1.00)
GFR calc Af Amer: >60 mL/min (ref >60)
GFR calc non Af Amer: >60 mL/min (ref >60)
Glucose, Bld: 212 mg/dL — ABNORMAL HIGH (ref 70–99)
Potassium: 4.6 mmol/L (ref 3.5–5.1)
Sodium: 139 mmol/L (ref 135–145)

## 2020-06-11 LAB — RENAL FUNCTION PANEL
Albumin: 3.3 g/dL — ABNORMAL LOW (ref 3.5–5.0)
Anion gap: 7 (ref 5–15)
BUN: 28 mg/dL — ABNORMAL HIGH (ref 8–23)
CO2: 29 mmol/L (ref 22–32)
Calcium: 8.3 mg/dL — ABNORMAL LOW (ref 8.9–10.3)
Chloride: 105 mmol/L (ref 98–111)
Creatinine, Ser: 0.88 mg/dL (ref 0.44–1.00)
GFR calc Af Amer: >60 mL/min (ref >60)
GFR calc non Af Amer: >60 mL/min (ref >60)
Glucose, Bld: 224 mg/dL — ABNORMAL HIGH (ref 70–99)
Phosphorus: 2.4 mg/dL — ABNORMAL LOW (ref 2.5–4.6)
Potassium: 4.6 mmol/L (ref 3.5–5.1)
Sodium: 141 mmol/L (ref 135–145)

## 2020-06-11 LAB — GLUCOSE, CAPILLARY
Glucose-Capillary: 142 mg/dL — ABNORMAL HIGH (ref 70–99)
Glucose-Capillary: 186 mg/dL — ABNORMAL HIGH (ref 70–99)
Glucose-Capillary: 186 mg/dL — ABNORMAL HIGH (ref 70–99)
Glucose-Capillary: 150 mg/dL — ABNORMAL HIGH (ref 70–99)

## 2020-06-11 LAB — BPAM RBC
Blood Product Expiration Date: 202107082359
Blood Product Expiration Date: 202107092359
Blood Product Expiration Date: 202107102359
Blood Product Expiration Date: 202107102359
ISSUE DATE / TIME: 202106161448
ISSUE DATE / TIME: 202106161736
ISSUE DATE / TIME: 202106182157
ISSUE DATE / TIME: 202106201125
Unit Type and Rh: 6200
Unit Type and Rh: 6200
Unit Type and Rh: 6200
Unit Type and Rh: 6200

## 2020-06-11 LAB — BRAIN NATRIURETIC PEPTIDE: B Natriuretic Peptide: 647.8 pg/mL — ABNORMAL HIGH (ref 0.0–100.0)

## 2020-06-11 LAB — CBC
HCT: 23.5 % — ABNORMAL LOW (ref 36.0–46.0)
Hemoglobin: 7.6 g/dL — ABNORMAL LOW (ref 12.0–15.0)
MCH: 30.2 pg (ref 26.0–34.0)
MCHC: 32.3 g/dL (ref 30.0–36.0)
MCV: 93.3 fL (ref 80.0–100.0)
Platelets: 180 10*3/uL (ref 150–400)
RBC: 2.52 MIL/uL — ABNORMAL LOW (ref 3.87–5.11)
RDW: 17.1 % — ABNORMAL HIGH (ref 11.5–15.5)
WBC: 8.5 10*3/uL (ref 4.0–10.5)
nRBC: 0 % (ref 0.0–0.2)

## 2020-06-11 LAB — HEPARIN LEVEL (UNFRACTIONATED)
Heparin Unfractionated: 0.13 IU/mL — ABNORMAL LOW (ref 0.30–0.70)
Heparin Unfractionated: 0.29 IU/mL — ABNORMAL LOW (ref 0.30–0.70)

## 2020-06-11 LAB — PHOSPHORUS: Phosphorus: 2.2 mg/dL — ABNORMAL LOW (ref 2.5–4.6)

## 2020-06-11 LAB — MAGNESIUM: Magnesium: 2.2 mg/dL (ref 1.7–2.4)

## 2020-06-11 LAB — PROTIME-INR
INR: 1.4 — ABNORMAL HIGH (ref 0.8–1.2)
Prothrombin Time: 17 seconds — ABNORMAL HIGH (ref 11.4–15.2)

## 2020-06-11 LAB — AMMONIA: Ammonia: 20 umol/L (ref 9–35)

## 2020-06-11 SURGERY — EGD (ESOPHAGOGASTRODUODENOSCOPY)
Anesthesia: General

## 2020-06-11 MED ORDER — BUPIVACAINE HCL (PF) 0.5 % IJ SOLN
INTRAMUSCULAR | Status: AC
  Filled 2020-06-11: qty 10

## 2020-06-11 MED ORDER — METHYLPREDNISOLONE SODIUM SUCC 125 MG IJ SOLR
INTRAMUSCULAR | Status: AC
  Filled 2020-06-11: qty 2

## 2020-06-11 MED ORDER — SUCCINYLCHOLINE CHLORIDE 200 MG/10ML IV SOSY
PREFILLED_SYRINGE | INTRAVENOUS | Status: AC
  Filled 2020-06-11: qty 10

## 2020-06-11 MED ORDER — PHENYLEPHRINE HCL (PRESSORS) 10 MG/ML IV SOLN
INTRAVENOUS | Status: AC
  Filled 2020-06-11: qty 1

## 2020-06-11 MED ORDER — IPRATROPIUM-ALBUTEROL 0.5-2.5 (3) MG/3ML IN SOLN
RESPIRATORY_TRACT | Status: AC
  Administered 2020-06-11: 3 mL via RESPIRATORY_TRACT
  Filled 2020-06-11: qty 3

## 2020-06-11 MED ORDER — LORAZEPAM 2 MG/ML IJ SOLN
1.0000 mg | INTRAMUSCULAR | Status: DC | PRN
  Administered 2020-06-11: 2 mg via INTRAVENOUS
  Administered 2020-06-11: 1 mg via INTRAVENOUS
  Administered 2020-06-12: 2 mg via INTRAVENOUS
  Filled 2020-06-11 (×3): qty 1

## 2020-06-11 MED ORDER — K PHOS MONO-SOD PHOS DI & MONO 155-852-130 MG PO TABS
500.0000 mg | ORAL_TABLET | Freq: Once | ORAL | Status: DC
  Filled 2020-06-11: qty 2

## 2020-06-11 MED ORDER — LIDOCAINE HCL (PF) 2 % IJ SOLN
INTRAMUSCULAR | Status: AC
  Filled 2020-06-11: qty 5

## 2020-06-11 MED ORDER — IPRATROPIUM-ALBUTEROL 0.5-2.5 (3) MG/3ML IN SOLN: 3.0000 mL | Freq: Once | RESPIRATORY_TRACT | Status: AC

## 2020-06-11 MED ORDER — PROPOFOL 10 MG/ML IV BOLUS
INTRAVENOUS | Status: DC | PRN
  Administered 2020-06-11: 30 mg via INTRAVENOUS
  Administered 2020-06-11: 10 mg via INTRAVENOUS
  Administered 2020-06-11: 20 mg via INTRAVENOUS

## 2020-06-11 MED ORDER — BUMETANIDE 0.25 MG/ML IJ SOLN
1.0000 mg | Freq: Once | INTRAMUSCULAR | Status: AC
  Administered 2020-06-11: 1 mg via INTRAVENOUS
  Filled 2020-06-11: qty 4

## 2020-06-11 MED ORDER — PROPOFOL 10 MG/ML IV BOLUS
INTRAVENOUS | Status: AC
  Filled 2020-06-11: qty 20

## 2020-06-11 MED ORDER — LIDOCAINE HCL (CARDIAC) PF 100 MG/5ML IV SOSY
PREFILLED_SYRINGE | INTRAVENOUS | Status: DC | PRN
Start: 2020-06-11 — End: 2020-06-11
  Administered 2020-06-11: 100 mg via INTRAVENOUS

## 2020-06-11 MED ORDER — FENTANYL CITRATE (PF) 100 MCG/2ML IJ SOLN
INTRAMUSCULAR | Status: AC
  Filled 2020-06-11: qty 2

## 2020-06-11 MED ORDER — MIDAZOLAM HCL 2 MG/2ML IJ SOLN
INTRAMUSCULAR | Status: AC
  Filled 2020-06-11: qty 2

## 2020-06-11 MED ORDER — ACETAMINOPHEN 10 MG/ML IV SOLN
INTRAVENOUS | Status: AC
  Filled 2020-06-11: qty 100

## 2020-06-11 MED ORDER — DEXMEDETOMIDINE HCL IN NACL 400 MCG/100ML IV SOLN
0.4000 ug/kg/h | INTRAVENOUS | Status: DC
  Administered 2020-06-11: 0.4 ug/kg/h via INTRAVENOUS
  Administered 2020-06-12 (×2): 0.8 ug/kg/h via INTRAVENOUS
  Administered 2020-06-13: 0.4 ug/kg/h via INTRAVENOUS
  Filled 2020-06-11 (×3): qty 100

## 2020-06-11 MED ORDER — WARFARIN - PHARMACIST DOSING INPATIENT: Freq: Every day | Status: DC

## 2020-06-11 MED ORDER — PROPOFOL 500 MG/50ML IV EMUL
INTRAVENOUS | Status: AC
  Filled 2020-06-11: qty 50

## 2020-06-11 MED ORDER — METHYLPREDNISOLONE SODIUM SUCC 125 MG IJ SOLR
INTRAMUSCULAR | Status: DC | PRN
Start: 2020-06-11 — End: 2020-06-11
  Administered 2020-06-11: 125 mg via INTRAVENOUS

## 2020-06-11 MED ORDER — HEPARIN (PORCINE) 25000 UT/250ML-% IV SOLN
1300.0000 [IU]/h | INTRAVENOUS | Status: DC
  Administered 2020-06-11: 1000 [IU]/h via INTRAVENOUS
  Administered 2020-06-12 – 2020-06-15 (×3): 1050 [IU]/h via INTRAVENOUS
  Administered 2020-06-17: 1300 [IU]/h via INTRAVENOUS
  Filled 2020-06-11 (×6): qty 250

## 2020-06-11 MED ORDER — HEPARIN (PORCINE) 25000 UT/250ML-% IV SOLN
1000.0000 [IU]/h | INTRAVENOUS | Status: DC
  Administered 2020-06-11: 1000 [IU]/h via INTRAVENOUS
  Filled 2020-06-11: qty 250

## 2020-06-11 MED ORDER — KETAMINE HCL 50 MG/ML IJ SOLN
INTRAMUSCULAR | Status: AC
  Filled 2020-06-11: qty 10

## 2020-06-11 MED ORDER — SODIUM CHLORIDE 0.9 % IV SOLN
INTRAVENOUS | Status: DC | PRN
Start: 2020-06-11 — End: 2020-06-11

## 2020-06-11 MED ORDER — PHENYLEPHRINE HCL (PRESSORS) 10 MG/ML IV SOLN
INTRAVENOUS | Status: DC | PRN
  Administered 2020-06-11: 200 ug via INTRAVENOUS

## 2020-06-11 MED ORDER — ROCURONIUM BROMIDE 10 MG/ML (PF) SYRINGE
PREFILLED_SYRINGE | INTRAVENOUS | Status: AC
  Filled 2020-06-11: qty 10

## 2020-06-11 MED ORDER — PROPOFOL 500 MG/50ML IV EMUL
INTRAVENOUS | Status: DC | PRN
  Administered 2020-06-11: 150 ug/kg/min via INTRAVENOUS

## 2020-06-11 MED ORDER — LORAZEPAM 2 MG/ML IJ SOLN: 1.0000 mg | INTRAMUSCULAR | Status: DC

## 2020-06-11 MED ORDER — WARFARIN SODIUM 4 MG PO TABS
8.0000 mg | ORAL_TABLET | Freq: Once | ORAL | Status: AC
  Administered 2020-06-11: 8 mg via ORAL
  Filled 2020-06-11: qty 2

## 2020-06-11 NOTE — Progress Notes (Signed)
Patient began morning Ao to self with confusion.  She was off unit for endoscopy and cauterization Arterio Venous Malformations per Dr. Alice Reichert. Patient consumed little food after she returned from procedure.  She consumes water with a straw and swallows med PO with water well.  Pacer had no issues.  Vitals WDL.  3L [place] saturating in the high 90's.  Heparin started after procedure @ 1000units/hr to midline.  Delivered ativan for CT scan to diagnose confusion however patient continued to move and CT was not realized.  Spoke with family on the phone and updated about condition.

## 2020-06-11 NOTE — Anesthesia Procedure Notes (Signed)
Date/Time: 06/11/2020 9:02 AM Performed by: Johnna Acosta, CRNA Pre-anesthesia Checklist: Patient identified, Emergency Drugs available, Suction available, Patient being monitored and Timeout performed Patient Re-evaluated:Patient Re-evaluated prior to induction Oxygen Delivery Method: Simple face mask Preoxygenation: Pre-oxygenation with 100% oxygen Induction Type: IV induction

## 2020-06-11 NOTE — Anesthesia Preprocedure Evaluation (Addendum)
Anesthesia Evaluation  Patient identified by MRN, date of birth, ID band Patient awake    Reviewed: Allergy & Precautions, NPO status , Patient's Chart, lab work & pertinent test results  Airway Mallampati: III       Dental   Pulmonary shortness of breath, asthma , sleep apnea , COPD, former smoker,           Cardiovascular hypertension, Pt. on medications + Peripheral Vascular Disease and +CHF  + dysrhythmias Atrial Fibrillation + pacemaker      Neuro/Psych  Headaches, PSYCHIATRIC DISORDERS Depression    GI/Hepatic GERD  Medicated,  Endo/Other  diabetes  Renal/GU  Bladder dysfunction      Musculoskeletal  (+) Arthritis ,   Abdominal   Peds negative pediatric ROS (+)  Hematology  (+) anemia ,   Anesthesia Other Findings Past Medical History: No date: Acute diastolic heart failure (HCC) No date: Allergy No date: ANCA-associated vasculitis (HCC) No date: Asthma No date: Atrial fibrillation ([place]) No date: Backache, unspecified No date: Cancer ([place])     Comment:  skin No date: Cardiomegaly No date: COPD (chronic obstructive pulmonary disease) (HCC) No date: Diabetes mellitus without complication (HCC) No date: Diffuse pulmonary alveolar hemorrhage     Comment:  Related to Cytoxan use No date: Esophageal reflux No date: Headache(784.0) No date: Herpes zoster without mention of complication No date: Hx: UTI (urinary tract infection) No date: Hypertension     Comment:  heart controlled w CHF No date: Nontoxic uninodular goiter No date: Obesity, unspecified No date: Osteoarthrosis, unspecified whether generalized or localized,  unspecified site No date: Unspecified sleep apnea No date: Urine incontinence     Comment:  hx of MVR  Reproductive/Obstetrics                            Anesthesia Physical Anesthesia Plan  ASA: IV and emergent  Anesthesia Plan: General   Post-op Pain  Management:    Induction: Intravenous  PONV Risk Score and Plan: Propofol infusion  Airway Management Planned: Nasal Cannula  Additional Equipment:   Intra-op Plan:   Post-operative Plan:   Informed Consent: I have reviewed the patients History and Physical, chart, labs and discussed the procedure including the risks, benefits and alternatives for the proposed anesthesia with the patient or authorized representative who has indicated his/her understanding and acceptance.     Dental advisory given  Plan Discussed with: CRNA and Surgeon  Anesthesia Plan Comments:        Anesthesia Quick Evaluation

## 2020-06-11 NOTE — Progress Notes (Signed)
Patient is lethargic upon assessment but awakens to voice and understands she is in the hospital undergoing tx for GI bleed.  Gave report to [employer] for EGD procedure.  Patient NPO since midnight.  She is internally paced with boughts of S-Tach from NSR of about 90 BPM.  Her lung sounds are diminished with inspiratory wheezing.  She is on 3L  with saturations around 98%.  Transport is present to move patient to procedure area.

## 2020-06-11 NOTE — Progress Notes (Signed)
Patient procedure complete.  In recovery vital signs are at baseline, O2 at 3L/[place] which is per baseline with oxygen saturation 95%.  Moving in bed, passing gas, denies abdominal pain at this time.  Abdomen remains soft and nontender.  Will call report to ICU and return to inpatient unit.

## 2020-06-11 NOTE — Progress Notes (Addendum)
[place] for heparin + warfarin Indication: afib/mechanical mitral valve   Allergies  Allergen Reactions  . Flecainide Shortness Of Breath and Other (See Comments)    Reaction: dizziness   . Amiodarone Other (See Comments)    Pt states that this medication causes lung bleeding.      Patient Measurements: Height: 5\' 9"  (175.3 cm) Weight: 91.8 kg (202 lb 6.1 oz) IBW/kg (Calculated) : 66.2 Heparin Dosing Weight: 84.9 kg  Vital Signs: Temp: 99.6 F (37.6 C) (06/20 0930) Temp Source: Temporal (06/20 0930) BP: 110/60 (06/20 0940) Pulse Rate: 97 (06/20 0940)  Labs: Recent Labs    06/09/20 0105 06/09/20 0105 06/10/20 0121 06/10/20 0121 06/10/20 0453 06/10/20 1319 06/11/20 0404  HGB 7.6*   < > 7.6*   < > 7.8*  --  7.6*  HCT 22.5*   < > 23.8*  --  23.1*  --  23.5*  PLT 199  --   --   --  177  --  180  LABPROT 27.7*  --   --   --  20.4*  --  17.0*  INR 2.7*  --   --   --  1.8*  --  1.4*  HEPARINUNFRC  --   --   --   --  0.55 0.58 0.13*  CREATININE 1.76*  --   --   --  1.19*  --  0.91  TROPONINIHS 28*  --   --   --   --   --   --    < > = values in this interval not displayed.    Estimated Creatinine Clearance: 66.4 mL/min (by C-G formula based on SCr of 0.91 mg/dL).   Medical History: Past Medical History:  Diagnosis Date  . Acute diastolic heart failure ([place])   . Allergy   . 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 Asthma   . Atrial fibrillation ([place])   . Backache, unspecified   . Cancer ([place])    skin  . Cardiomegaly   . COPD (chronic obstructive pulmonary disease) ([place])   . Diabetes mellitus without complication ([place])   . 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 Esophageal reflux   . Headache(784.0)   . Herpes zoster without mention of complication   . Hx: UTI (urinary tract infection)   . Hypertension    heart controlled w CHF  . Nontoxic uninodular goiter   . Obesity, unspecified   .  Osteoarthrosis, unspecified whether generalized or localized, unspecified site   . Unspecified sleep apnea   . Urine incontinence    hx of    Assessment: 74 year old female with afib on warfarin PTA. Patient also with mechanical mitral valve replacement with goal INR 2.5-3.5. Home regimen 7 mg Mon, Tues, Thurs, Fri and 8 mg Wed, Sat, Sun. Warfarin has been on hold for endoscopic procedure. Pharmacy now consulted to restart warfarin. Patient continues on heparin drip.  Date INR Dose 6/16 4.6 -- 6/17 3.3 -- 6/18 2.7 -- 6/19 1.8 -- 6/20 1.4    Goal of Therapy:  Heparin level 0.3-0.7 units/ml  INR 2.5-3.5 Monitor platelets by anticoagulation protocol: Yes   Plan:  Heparin drip restarted at previous rate 1000 units/hr. HL ordered at 2000. CBC daily while on heparin drip. Heparin drip should be continued at minimum until INR therapeutic.   INR now subtherapeutic after having been on hold. S/p upper GI endoscopy today with multiple bleeding angioectasias in the  stomach. Will dose on conservative side tonight with 8 mg once. Patient will likely require a decrease in home dose as INR supratherapeutic on presentation. INR with morning labs.  Tawnya Crook, PharmD 06/11/2020,9:43 AM

## 2020-06-11 NOTE — Progress Notes (Addendum)
[facility] for heparin + warfarin Indication: afib/mechanical mitral valve   Allergies  Allergen Reactions  . Flecainide Shortness Of Breath and Other (See Comments)    Reaction: dizziness   . Amiodarone Other (See Comments)    Pt states that this medication causes lung bleeding.      Patient Measurements: Height: 5\' 9"  (175.3 cm) Weight: 91.8 kg (202 lb 6.1 oz) IBW/kg (Calculated) : 66.2 Heparin Dosing Weight: 84.9 kg  Vital Signs: Temp: 98.2 F (36.8 C) (06/20 1600) Temp Source: Axillary (06/20 1600) BP: 96/57 (06/20 1800) Pulse Rate: 96 (06/20 1800)  Labs: Recent Labs    06/09/20 0105 06/09/20 0105 06/10/20 0121 06/10/20 0121 06/10/20 0453 06/10/20 0453 06/10/20 1319 06/11/20 0404 06/11/20 1436 06/11/20 1928  HGB 7.6*   < > 7.6*   < > 7.8*  --   --  7.6*  --   --   HCT 22.5*   < > 23.8*  --  23.1*  --   --  23.5*  --   --   PLT 199  --   --   --  177  --   --  180  --   --   LABPROT 27.7*  --   --   --  20.4*  --   --  17.0*  --   --   INR 2.7*  --   --   --  1.8*  --   --  1.4*  --   --   HEPARINUNFRC  --   --   --   --  0.55   < > 0.58 0.13*  --  0.29*  CREATININE 1.76*   < >  --   --  1.19*  --   --  0.91 0.88  --   TROPONINIHS 28*  --   --   --   --   --   --   --   --   --    < > = values in this interval not displayed.    Estimated Creatinine Clearance: 68.7 mL/min (by C-G formula based on SCr of 0.88 mg/dL).   Medical History: Past Medical History:  Diagnosis Date  . Acute diastolic heart failure ([place])   . Allergy   . 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 Asthma   . Atrial fibrillation ([place])   . Backache, unspecified   . Cancer ([place])    skin  . Cardiomegaly   . COPD (chronic obstructive pulmonary disease) ([place])   . Diabetes mellitus without complication ([place])   . 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 Esophageal reflux   . Headache(784.0)   . Herpes zoster without mention of  complication   . Hx: UTI (urinary tract infection)   . Hypertension    heart controlled w CHF  . Nontoxic uninodular goiter   . Obesity, unspecified   . Osteoarthrosis, unspecified whether generalized or localized, unspecified site   . Unspecified sleep apnea   . Urine incontinence    hx of    Assessment: 74 year old female with afib on warfarin PTA. Patient also with mechanical mitral valve replacement with goal INR 2.5-3.5. Home regimen 7 mg Mon, Tues, Thurs, Fri and 8 mg Wed, Sat, Sun. Warfarin has been on hold for endoscopic procedure. Pharmacy now consulted to restart warfarin. Patient continues on heparin drip.  Date INR Dose 6/16 4.6 -- 6/17 3.3 -- 6/18 2.7 -- 6/19 1.8 --  6/20 1.4    Goal of Therapy:  Heparin level 0.3-0.7 units/ml  INR 2.5-3.5 Monitor platelets by anticoagulation protocol: Yes   Plan:  Heparin drip restarted at previous rate 1000 units/hr.   HL ordered at 2000 - returned slightly subtherapeutic @ 0.29 - will increase to 1050 units/hr and recheck in 8 hours  CBC daily while on heparin drip.   Heparin drip should be continued at minimum until INR therapeutic.   INR now subtherapeutic after having been on hold. S/p upper GI endoscopy today with multiple bleeding angioectasias in the stomach. Will dose on conservative side tonight with 8 mg once. Patient will likely require a decrease in home dose as INR supratherapeutic on presentation. INR with morning labs.   Lu Duffel, PharmD, BCPS Clinical Pharmacist 06/11/2020 8:38 PM

## 2020-06-11 NOTE — Transfer of Care (Signed)
Immediate Anesthesia Transfer of Care Note  Patient: Betty [name]  Procedure(s) Performed: ESOPHAGOGASTRODUODENOSCOPY (EGD) (N/A )  Patient Location: PACU  Anesthesia Type:General  Level of Consciousness: drowsy  Airway & Oxygen Therapy: Patient Spontanous Breathing and Patient connected to face mask oxygen  Post-op Assessment: Report given to RN and Post -op Vital signs reviewed and stable  Post vital signs: Reviewed and stable  Last Vitals:  Vitals Value Taken Time  BP 116/52 06/11/20 0924  Temp 36.5 C 06/11/20 0924  Pulse 101 06/11/20 0924  Resp 12 06/11/20 0924  SpO2 99 % 06/11/20 0924    Last Pain:  Vitals:   06/11/20 0924  TempSrc: Temporal  PainSc:       Patients Stated Pain Goal: 6 (76/14/70 9295)  Complications: No complications documented.

## 2020-06-11 NOTE — Progress Notes (Signed)
Follow up - Critical Care Medicine Note  Patient Details:    Betty [name] is an 74 y.o. female admitted with Acute Blood Loss Anemia due to GIB with super therapuetic INR from Warfarin Therapy (therapy for Mechanical Valve) associated with  severe acute renal failure, along with UTI s/p EGD Day # 1  Lines/tubes : External Urinary Catheter (Active)  Collection Container Dedicated Suction Canister 06/09/20 1556  Securement Method Tape 06/09/20 1556  Site Assessment Clean;Intact;Dry 06/09/20 1556  Intervention Equipment Changed 06/09/20 1556  Output (mL) 250 mL 06/09/20 1646    Microbiology/Sepsis markers: Results for orders placed or performed during the hospital encounter of 06/07/20  SARS Coronavirus 2 by RT PCR (hospital order, performed in [facility] hospital lab) Nasopharyngeal Nasopharyngeal Swab     Status: None   Collection Time: 06/07/20  1:34 PM   Specimen: Nasopharyngeal Swab  Result Value Ref Range Status   SARS Coronavirus 2 NEGATIVE NEGATIVE Final    Comment: (NOTE) SARS-CoV-2 target nucleic acids are NOT DETECTED.  The SARS-CoV-2 RNA is generally detectable in upper and lower respiratory specimens during the acute phase of infection. The lowest concentration of SARS-CoV-2 viral copies this assay can detect is 250 copies / mL. A negative result does not preclude SARS-CoV-2 infection and should not be used as the sole basis for treatment or other patient management decisions.  A negative result may occur with improper specimen collection / handling, submission of specimen other than nasopharyngeal swab, presence of viral mutation(s) within the areas targeted by this assay, and inadequate number of viral copies (<250 copies / mL). A negative result must be combined with clinical observations, patient history, and epidemiological information.  Fact Sheet for Patients:   StrictlyIdeas.no  Fact Sheet for Healthcare  Providers: BankingDealers.co.za  This test is not yet approved or  cleared by the [place] FDA and has been authorized for detection and/or diagnosis of SARS-CoV-2 by FDA under an Emergency Use Authorization (EUA).  This EUA will remain in effect (meaning this test can be used) for the duration of the COVID-19 declaration under Section 564(b)(1) of the Act, 21 U.S.C. section 360bbb-3(b)(1), unless the authorization is terminated or revoked sooner.  Performed at [facility], [address] [place], [place] [address]   Blood Culture (routine x 2)     Status: None (Preliminary result)   Collection Time: 06/07/20  5:24 PM   Specimen: BLOOD  Result Value Ref Range Status   Specimen Description BLOOD BLOOD RIGHT HAND  Final   Special Requests   Final    BOTTLES DRAWN AEROBIC AND ANAEROBIC Blood Culture adequate volume   Culture   Final    NO GROWTH 4 DAYS Performed at [facility], [address] [place], [place] [address]    Report Status PENDING  Incomplete  Urine culture     Status: Abnormal   Collection Time: 06/07/20  5:24 PM   Specimen: Urine, Clean Catch  Result Value Ref Range Status   Specimen Description   Final    URINE, CLEAN CATCH Performed at [facility], [address] [place], [place] [address]    Special Requests   Final    NONE Performed at [facility], [address] [place], [place] [address]    Culture >=100,000 COLONIES/mL KLEBSIELLA PNEUMONIAE (A)  Final   Report Status 06/09/2020 FINAL  Final   Organism ID, Bacteria KLEBSIELLA PNEUMONIAE (A)  Final      Susceptibility   Klebsiella  pneumoniae - MIC*    AMPICILLIN RESISTANT Resistant     CEFAZOLIN <=4 SENSITIVE Sensitive     CEFTRIAXONE <=0.25 SENSITIVE Sensitive     CIPROFLOXACIN <=0.25 SENSITIVE Sensitive     GENTAMICIN <=1 SENSITIVE Sensitive     IMIPENEM <=0.25 SENSITIVE Sensitive     NITROFURANTOIN 64 INTERMEDIATE  Intermediate     TRIMETH/SULFA <=20 SENSITIVE Sensitive     AMPICILLIN/SULBACTAM 4 SENSITIVE Sensitive     PIP/TAZO <=4 SENSITIVE Sensitive     * >=100,000 COLONIES/mL KLEBSIELLA PNEUMONIAE  MRSA PCR Screening     Status: None   Collection Time: 06/07/20  6:25 PM   Specimen: Nasopharyngeal  Result Value Ref Range Status   MRSA by PCR NEGATIVE NEGATIVE Final    Comment:        The GeneXpert MRSA Assay (FDA approved for NASAL specimens only), is one component of a comprehensive MRSA colonization surveillance program. It is not intended to diagnose MRSA infection nor to guide or monitor treatment for MRSA infections. Performed at [facility], [address], [place], [place] [address]     Anti-infectives:  Anti-infectives (From admission, onward)   Start     Dose/Rate Route Frequency Ordered Stop   06/07/20 2100  cefTRIAXone (ROCEPHIN) 1 g in sodium chloride 0.9 % 100 mL IVPB     Discontinue     1 g 200 mL/hr over 30 Minutes Intravenous Every 24 hours 06/07/20 1938 06/14/20 2059     Scheduled Meds: . sodium chloride   Intravenous Once  . sodium chloride   Intravenous Once  . sodium chloride   Intravenous Once  . azaTHIOprine  100 mg Oral Daily  . Chlorhexidine Gluconate Cloth  6 each Topical Daily  . insulin aspart  0-5 Units Subcutaneous QHS  . insulin aspart  0-9 Units Subcutaneous TID WC  . mouth rinse  15 mL Mouth Rinse BID  . pantoprazole  40 mg Intravenous Q12H  . phosphorus  500 mg Oral Once  . rOPINIRole  3 mg Oral TID  . sodium chloride flush  10-40 mL Intracatheter Q12H  . sodium chloride flush  3 mL Intravenous Q12H  . warfarin  8 mg Oral ONCE-1600  . Warfarin - Pharmacist Dosing Inpatient   Does not apply q1600   Continuous Infusions: . sodium chloride    . cefTRIAXone (ROCEPHIN)  IV Stopped (06/10/20 2143)  . dextrose 5 % and 0.45 % NaCl with KCl 40 mEq/L 50 mL/hr at 06/11/20 1127  . heparin 1,000 Units/hr (06/11/20 1125)   PRN Meds:.sodium  chloride, acetaminophen, docusate sodium, LORazepam, ondansetron (ZOFRAN) IV, oxyCODONE-acetaminophen, polyethylene glycol, sodium chloride flush, sodium chloride flush  Best Practice/Protocols:  VTE px: Coumadin SUP px:IV Protonix Diet:Regular Diet  Consults: Treatment Team:  Teodoro Spray, MD Efrain Sella, MD Isaias Cowman, MD    Studies: DG Chest Port 1 View  Result Date: 06/08/2020 CLINICAL DATA:  Shortness of breath, interval changes EXAM: PORTABLE CHEST 1 VIEW COMPARISON:  August 05, 2019 FINDINGS: The heart size and mediastinal contours are unchanged with cardiomegaly. A left-sided pacemaker is again noted. Prosthetic mitral valve and overlying median sternotomy wires are present. There is diffusely increased interstitial markings seen throughout both lungs as on the prior exam. There is also prominence of the central pulmonary vasculature. IMPRESSION: Cardiomegaly and pulmonary vascular congestion. Diffusely increased interstitial markings seen throughout both lungs, likely due to chronic lung changes. Electronically Signed   By: Prudencio Pair M.D.   On: 06/08/2020 04:21  SIGNIFICANT EVENTS 6/16: Admitted to ICU 6/20: S/p EGD showing multiple bleeding angiectasias in the stomach noted treated with argon plasma coagulation (APC). Also noted Gastritis 6/20: Post -op Delirium   CC  Follow up INR  Subjective:    Patient with worsening altered mental status post EGD but easily re-oriented  Objective:  Vital signs for last 24 hours: Temp:  [97.7 F (36.5 C)-99.6 F (37.6 C)] 99.6 F (37.6 C) (06/20 0930) Pulse Rate:  [25-138] 91 (06/20 1300) Resp:  [12-28] 21 (06/20 1300) BP: (84-134)/(38-80) 110/74 (06/20 1300) SpO2:  [51 %-100 %] 94 % (06/20 1300) Weight:  [91.8 kg] 91.8 kg (06/20 0818)  Intake/Output from previous day: 06/19 0701 - 06/20 0700 In: 1732.6 [I.V.:1632.6; IV Piggyback:100] Out: 1700 [Urine:1700]  Intake/Output this shift: Total  I/O In: 350 [I.V.:350] Out: 203 [Urine:200; Blood:3]   PHYSICAL EXAMINATION General Appearance: Cchronically ill appearing female, sitting in bed, on chronic 3L [place], in No acute distress  Neuro:Atraumatic, normocephalic, neck supple,no JVD,  speech normal,  HEENT: PERRLA, no icterus Pulmonary: normal breath sounds, No wheezing.  CardiovascularNormal S1,S2.  No m/r/g.    Valve sounds crisp. Abdomen: Benign, Soft, non-tender, nondistended, no guarding or rebound tenderness Renal:  No costovertebral tenderness  GU:  Not performed at this time. Endoc: No evident thyromegaly Skin:   warm, no rashes, no ecchymosis  Extremities: normal, no cyanosis, clubbing. PSYCHIATRIC: Intermittent confusion  Results for orders placed or performed during the hospital encounter of 06/07/20 (from the past 24 hour(s))  Glucose, capillary     Status: Abnormal   Collection Time: 06/10/20  4:59 PM  Result Value Ref Range   Glucose-Capillary 135 (H) 70 - 99 mg/dL  Glucose, capillary     Status: Abnormal   Collection Time: 06/10/20  9:24 PM  Result Value Ref Range   Glucose-Capillary 127 (H) 70 - 99 mg/dL   Comment 1 Document in Chart   Protime-INR     Status: Abnormal   Collection Time: 06/11/20  4:04 AM  Result Value Ref Range   Prothrombin Time 17.0 (H) 11.4 - 15.2 seconds   INR 1.4 (H) 0.8 - 1.2  Basic metabolic panel     Status: Abnormal   Collection Time: 06/11/20  4:04 AM  Result Value Ref Range   Sodium 139 135 - 145 mmol/L   Potassium 4.6 3.5 - 5.1 mmol/L   Chloride 103 98 - 111 mmol/L   CO2 33 (H) 22 - 32 mmol/L   Glucose, Bld 212 (H) 70 - 99 mg/dL   BUN 37 (H) 8 - 23 mg/dL   Creatinine, Ser 0.91 0.44 - 1.00 mg/dL   Calcium 8.0 (L) 8.9 - 10.3 mg/dL   GFR calc non Af Amer >60 >60 mL/min   GFR calc Af Amer >60 >60 mL/min   Anion gap 3 (L) 5 - 15  Heparin level (unfractionated)     Status: Abnormal   Collection Time: 06/11/20  4:04 AM  Result Value Ref Range   Heparin Unfractionated 0.13  (L) 0.30 - 0.70 IU/mL  CBC     Status: Abnormal   Collection Time: 06/11/20  4:04 AM  Result Value Ref Range   WBC 8.5 4.0 - 10.5 K/uL   RBC 2.52 (L) 3.87 - 5.11 MIL/uL   Hemoglobin 7.6 (L) 12.0 - 15.0 g/dL   HCT 23.5 (L) 36 - 46 %   MCV 93.3 80.0 - 100.0 fL   MCH 30.2 26.0 - 34.0 pg   MCHC 32.3 30.0 -  36.0 g/dL   RDW 17.1 (H) 11.5 - 15.5 %   Platelets 180 150 - 400 K/uL   nRBC 0.0 0.0 - 0.2 %  Magnesium     Status: None   Collection Time: 06/11/20  4:04 AM  Result Value Ref Range   Magnesium 2.2 1.7 - 2.4 mg/dL  Phosphorus     Status: Abnormal   Collection Time: 06/11/20  4:04 AM  Result Value Ref Range   Phosphorus 2.2 (L) 2.5 - 4.6 mg/dL  Glucose, capillary     Status: Abnormal   Collection Time: 06/11/20 11:31 AM  Result Value Ref Range   Glucose-Capillary 186 (H) 70 - 99 mg/dL     ASSESSMENT/PLAN   Acute GI Bleed in setting of Supratherapeutic INR -S/p EGD 6/20. 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 Also noted Gastritis. -Switch to Protonix po 40 mg BID -Cardiology recommends avoiding vitamin K and allowing INR to drift down naturally; if becomes unstable consider FFP -GI input appreciated  ACUTE Blood Loss Anemia -Monitor for S/Sx of bleeding -Trend CBC (follow H&H q6h) -SCD's for VTE Prophylaxis  -Transfuse for Hgb <8  Acute Altered Mental Status - Likely post- op delirium -No focal neurological deficit on exam to suggest neurological event , however if worsening symptoms noted can consider CT head -Will check Ammonia -Neuro checks per protocol  Hypotension in setting of Hemorrhagic shock +/- Septic Shock Hx: Mechanical Valve on Warfarin, Paroxysmal A-fib, CHF, HTN -Continuous cardiac monitoring -Maintain MAP >65 -Cautious IV Fluids (given CHF hx) -Transfusions as indicated -Vasopressors if needed to maintain MAP goal -Trend lactic acid -Hold home  Torsemide, BB, & Spironolactone given Hypotension & AKI -Started on Heparin and coumadin, will wean off Heparin once INR therapeutic -Trend INR ~ INR Goal 2.5-3.5 -Cardiology following appreciate input  ACUTE KIDNEY INJURY/Renal Failure - Renal function improving Anion Gap Metabolic Acidosis in setting of Lactic Acidosis -Monitor I&O's / urinary output -Follow BMP -Ensure adequate renal perfusion -Avoid nephrotoxic agents as able -Replace electrolytes as indicated -Trend lactic acid  UTI -Urine culture positive for Klebsiella Pneumoniae -Monitor fever curve -Trend WBC's & Procalcitonin -Continue  Rocephin  History of COPD & Asthma without acute exacerbation (chronically on 3L supplemental O2) Obstructive Sleep [place] -Supplemental O2 as needed to maintain O2 sats 88 to 92% -BiPAP qhs -Follow intermittent CXR & ABG as needed -PRN Bronchodilators  Diabetes Mellitus -CBG's -SSI if needed -Follow ICU Hypo/hyperglycemia protocol -Hold home Metformin & Glipizide      Rufina Falco. DNP, CCRN, FNP-BC [place]

## 2020-06-11 NOTE — Anesthesia Postprocedure Evaluation (Signed)
Anesthesia Post Note  Patient: Betty [name]  Procedure(s) Performed: ESOPHAGOGASTRODUODENOSCOPY (EGD) (N/A )  Patient location during evaluation: Endoscopy Anesthesia Type: General Level of consciousness: awake and alert and oriented Pain management: pain level controlled Vital Signs Assessment: post-procedure vital signs reviewed and stable Respiratory status: spontaneous breathing Cardiovascular status: blood pressure returned to baseline Anesthetic complications: no   No complications documented.   Last Vitals:  Vitals:   06/11/20 1740 06/11/20 1800  BP: 112/69 (!) 96/57  Pulse: 97 96  Resp: (!) 22 (!) 26  Temp:    SpO2: 98% 97%    Last Pain:  Vitals:   06/11/20 1600  TempSrc: Axillary  PainSc: 0-No pain                 Tauriel Scronce

## 2020-06-11 NOTE — Progress Notes (Signed)
[facility] Cardiology  SUBJECTIVE: Patient laying in bed, somnolent following upper GI endoscopy   Vitals:   06/11/20 0950 06/11/20 1100 06/11/20 1200 06/11/20 1300  BP: 126/67 130/60 134/65 110/74  Pulse: (!) 106 94 82 91  Resp: 16 15 (!) 23 (!) 21  Temp:      TempSrc:      SpO2: 96% 97% 93% 94%  Weight:      Height:         Intake/Output Summary (Last 24 hours) at 06/11/2020 1328 Last data filed at 06/11/2020 8144 Gross per 24 hour  Intake 2062.52 ml  Output 1203 ml  Net 859.52 ml      PHYSICAL EXAM  General: Well developed, well nourished, in no acute distress HEENT:  Normocephalic and atramatic Neck:  No JVD.  Lungs: Clear bilaterally to auscultation and percussion. Heart: HRRR . Normal S1 and S2 without gallops or murmurs.  Abdomen: Bowel sounds are positive, abdomen soft and non-tender  Msk:  Back normal, normal gait. Normal strength and tone for age. Extremities: No clubbing, cyanosis or edema.   Neuro: Alert and oriented X 3. Psych:  Good affect, responds appropriately   LABS: Basic Metabolic Panel: Recent Labs    06/10/20 0453 06/11/20 0404  NA 140 139  K 3.3* 4.6  CL 99 103  CO2 34* 33*  GLUCOSE 166* 212*  BUN 75* 37*  CREATININE 1.19* 0.91  CALCIUM 8.1* 8.0*  MG 2.5* 2.2  PHOS 2.5 2.2*   Liver Function Tests: Recent Labs    06/10/20 0453  ALBUMIN 3.1*   No results for input(s): LIPASE, AMYLASE in the last 72 hours. CBC: Recent Labs    06/10/20 0453 06/11/20 0404  WBC 8.2 8.5  HGB 7.8* 7.6*  HCT 23.1* 23.5*  MCV 90.6 93.3  PLT 177 180   Cardiac Enzymes: No results for input(s): CKTOTAL, CKMB, CKMBINDEX, TROPONINI in the last 72 hours. BNP: Invalid input(s): POCBNP D-Dimer: No results for input(s): DDIMER in the last 72 hours. Hemoglobin A1C: No results for input(s): HGBA1C in the last 72 hours. Fasting Lipid Panel: No results for input(s): CHOL, HDL, LDLCALC, TRIG, CHOLHDL, LDLDIRECT in the last 72 hours. Thyroid Function  Tests: No results for input(s): TSH, T4TOTAL, T3FREE, THYROIDAB in the last 72 hours.  Invalid input(s): FREET3 Anemia Panel: No results for input(s): VITAMINB12, FOLATE, FERRITIN, TIBC, IRON, RETICCTPCT in the last 72 hours.  No results found.   Echo LVEF 60 to 65% by 2D echocardiogram 12/26/2018  TELEMETRY: Sinus rhythm:  ASSESSMENT AND PLAN:  Active Problems:   Afib (HCC)   Shock ([place])    1.  Status post MVR with mechanical valve, warfarin and heparin restarted following regulation of gastric AVMs, INR 1.4 2.  Paroxysmal atrial fibrillation, patient in sinus rhythm, on warfarin for stroke prevention 3.  Anemia/GI bleed, status post upper endoscopy, status post coagulation of gastric AVMs  Recommendations  1.  Agree with current therapy 2.  Continue Coumadin, with target INR 2.5-3.5 3.  Continue heparin infusion until INR 2.5   Isaias Cowman, MD, PhD, [facility] 06/11/2020 1:28 PM

## 2020-06-11 NOTE — Significant Event (Signed)
Patient returned from endoscopy where she had EGD with propofol, somewhat agitated and confused.  Has required Ativan as needed.  Attempted a CT scan of the head however patient too agitated and would not cooperate for exam.  There is really no focal deficit noted.  Ammonia level is normal.  Initial work-up nonrevealing.  Does not appear to have a history of alcohol abuse however is acting as if possible withdrawal.  Continue as needed Ativan and  will initiate Precedex if needed.    Renold Don, MD [place]  *This note was dictated using voice recognition software/Dragon.  Despite best efforts to proofread, errors can occur which can change the meaning.  Any change was purely unintentional.

## 2020-06-11 NOTE — Progress Notes (Signed)
Patient in ENDO for Upper Endoscopy this morning with Dr.Toledo.  Audible wheezes noted, Dr. Kayleen Memos in to assess.  Order received for Duoneb x 1 to be given.

## 2020-06-11 NOTE — Op Note (Signed)
[facility] Patient Name: Betty [name] Procedure Date: 06/11/2020 8:16 AM MRN: [id number] Account #: [id number] Date of Birth: 1946/07/02 Admit Type: Inpatient Age: 74 Room: [facility] ENDO ROOM 4 Gender: Female Note Status: Finalized Procedure:             Upper GI endoscopy Indications:           Acute post hemorrhagic anemia, Melena Providers:             Benay Pike. Alice Reichert MD, MD Referring MD:          Dion Body (Referring MD) Medicines:             Propofol per Anesthesia Complications:         No immediate complications. Estimated blood loss:                         Minimal. Procedure:             Pre-Anesthesia Assessment:                        - The risks and benefits of the procedure and the                         sedation options and risks were discussed with the                         patient. All questions were answered and informed                         consent was obtained.                        - Patient identification and proposed procedure were                         verified prior to the procedure by the nurse. The                         procedure was verified in the procedure room.                        - ASA Grade Assessment: III - A patient with severe                         systemic disease.                        - After reviewing the risks and benefits, the patient                         was deemed in satisfactory condition to undergo the                         procedure.                        After obtaining informed consent, the endoscope was                         passed under direct vision. Throughout the procedure,  the patient's blood pressure, pulse, and oxygen                         saturations were monitored continuously. The Endoscope                         was introduced through the mouth, and advanced to the                         third part of duodenum. The upper GI  endoscopy was                         accomplished without difficulty. The patient tolerated                         the procedure well. Findings:      The examined esophagus was normal.      An endoclip was found in the gastric body. The clip was still attached,       overlying apparently normal-appearing gastric mucosa, possible area of a       previous AVM that was treated in the past.      Multiple small angioectasias with bleeding were found on the greater       curvature of the stomach. Coagulation for hemostasis using argon plasma       at 0.8 liters/minute and 20 watts was successful. Estimated blood loss       was minimal.      Patchy mild inflammation characterized by erythema was found in the       gastric antrum.      The examined duodenum was normal.      The exam was otherwise without abnormality. Impression:            - Normal esophagus.                        - An endoclip was found in the stomach.                        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                         Treated with argon plasma coagulation (APC).                        - Gastritis.                        - Normal examined duodenum.                        - The examination was otherwise normal.                        - No specimens collected. Recommendation:        - Return patient to ICU for ongoing care.                        - Resume Coumadin (warfarin) today and heparin today  at prior doses. Refer to managing physician for                         further adjustment of therapy.                        - Cardiac diet today.                        - Continue present medications. Procedure Code(s):     --- Professional ---                        [phone], Esophagogastroduodenoscopy, flexible,                         transoral; with control of bleeding, any method Diagnosis Code(s):     --- Professional ---                        K92.1, Melena (includes  Hematochezia)                        D62, Acute posthemorrhagic anemia                        K29.70, Gastritis, unspecified, without bleeding                        K31.811, Angiodysplasia of stomach and duodenum with                         bleeding                        T18.2XXA, Foreign body in stomach, initial encounter CPT copyright 2019 American Medical Association. All rights reserved. The codes documented in this report are preliminary and upon coder review may  be revised to meet current compliance requirements. Efrain Sella MD, MD 06/11/2020 9:28:38 AM This report has been signed electronically. Number of Addenda: 0 Note Initiated On: 06/11/2020 8:16 AM Estimated Blood Loss:  Estimated blood loss was minimal.      [facility]

## 2020-06-11 NOTE — Interval H&P Note (Signed)
History and Physical Interval Note:  06/11/2020 8:18 AM  Betty [name]  has presented today for surgery, with the diagnosis of Acute blood loss anemia, Hx of gastric AVMs, supratherapeutic INR on admission.  The various methods of treatment have been discussed with the patient and family. After consideration of risks, benefits and other options for treatment, the patient has consented to  Procedure(s): ESOPHAGOGASTRODUODENOSCOPY (EGD) (N/A) as a surgical intervention.  The patient's history has been reviewed, patient examined, no change in status, stable for surgery.  I have reviewed the patient's chart and labs.  Questions were answered to the patient's satisfaction.     [place], [place]

## 2020-06-12 ENCOUNTER — Encounter: Payer: Self-pay | Admitting: Internal Medicine

## 2020-06-12 ENCOUNTER — Inpatient Hospital Stay: Payer: Medicare PPO

## 2020-06-12 LAB — CULTURE, BLOOD (ROUTINE X 2)
Culture: NO GROWTH
Special Requests: ADEQUATE

## 2020-06-12 LAB — PREPARE RBC (CROSSMATCH)

## 2020-06-12 LAB — PROTIME-INR
INR: 1.6 — ABNORMAL HIGH (ref 0.8–1.2)
Prothrombin Time: 18.7 seconds — ABNORMAL HIGH (ref 11.4–15.2)

## 2020-06-12 LAB — BASIC METABOLIC PANEL
Anion gap: 8 (ref 5–15)
BUN: 30 mg/dL — ABNORMAL HIGH (ref 8–23)
CO2: 30 mmol/L (ref 22–32)
Calcium: 8.3 mg/dL — ABNORMAL LOW (ref 8.9–10.3)
Chloride: 106 mmol/L (ref 98–111)
Creatinine, Ser: 1 mg/dL (ref 0.44–1.00)
GFR calc Af Amer: >60 mL/min (ref >60)
GFR calc non Af Amer: 56 mL/min — ABNORMAL LOW (ref >60)
Glucose, Bld: 147 mg/dL — ABNORMAL HIGH (ref 70–99)
Potassium: 4.3 mmol/L (ref 3.5–5.1)
Sodium: 144 mmol/L (ref 135–145)

## 2020-06-12 LAB — MAGNESIUM: Magnesium: 2.3 mg/dL (ref 1.7–2.4)

## 2020-06-12 LAB — GLUCOSE, CAPILLARY
Glucose-Capillary: 134 mg/dL — ABNORMAL HIGH (ref 70–99)
Glucose-Capillary: 135 mg/dL — ABNORMAL HIGH (ref 70–99)
Glucose-Capillary: 125 mg/dL — ABNORMAL HIGH (ref 70–99)
Glucose-Capillary: 85 mg/dL (ref 70–99)

## 2020-06-12 LAB — HEPARIN LEVEL (UNFRACTIONATED)
Heparin Unfractionated: 0.47 IU/mL (ref 0.30–0.70)
Heparin Unfractionated: 0.41 IU/mL (ref 0.30–0.70)

## 2020-06-12 LAB — CBC
HCT: 26.4 % — ABNORMAL LOW (ref 36.0–46.0)
Hemoglobin: 8 g/dL — ABNORMAL LOW (ref 12.0–15.0)
MCH: 29.5 pg (ref 26.0–34.0)
MCHC: 30.3 g/dL (ref 30.0–36.0)
MCV: 97.4 fL (ref 80.0–100.0)
Platelets: 172 10*3/uL (ref 150–400)
RBC: 2.71 MIL/uL — ABNORMAL LOW (ref 3.87–5.11)
RDW: 16.5 % — ABNORMAL HIGH (ref 11.5–15.5)
WBC: 5.8 10*3/uL (ref 4.0–10.5)
nRBC: 0 % (ref 0.0–0.2)

## 2020-06-12 LAB — PHOSPHORUS: Phosphorus: 3.1 mg/dL (ref 2.5–4.6)

## 2020-06-12 MED ORDER — WARFARIN SODIUM 4 MG PO TABS
8.0000 mg | ORAL_TABLET | Freq: Once | ORAL | Status: AC
  Administered 2020-06-12: 8 mg via ORAL
  Filled 2020-06-12: qty 2

## 2020-06-12 MED ORDER — NOREPINEPHRINE 4 MG/250ML-% IV SOLN
2.0000 ug/min | INTRAVENOUS | Status: DC
  Administered 2020-06-12: 5 ug/min via INTRAVENOUS
  Filled 2020-06-12: qty 250

## 2020-06-12 MED ORDER — SODIUM CHLORIDE 0.9 % IV SOLN
250.0000 mL | INTRAVENOUS | Status: DC
  Administered 2020-06-12: 50 mL via INTRAVENOUS

## 2020-06-12 NOTE — Progress Notes (Signed)
[place] for heparin + warfarin Indication: afib/mechanical mitral valve   Allergies  Allergen Reactions  . Flecainide Shortness Of Breath and Other (See Comments)    Reaction: dizziness   . Amiodarone Other (See Comments)    Pt states that this medication causes lung bleeding.      Patient Measurements: Height: 5\' 9"  (175.3 cm) Weight: 91.6 kg (201 lb 15.1 oz) IBW/kg (Calculated) : 66.2 Heparin Dosing Weight: 84.9 kg  Vital Signs: Temp: 98 F (36.7 C) (06/21 1300) Temp Source: Oral (06/21 1300) BP: 104/58 (06/21 1430) Pulse Rate: 111 (06/21 1400)  Labs: Recent Labs    06/10/20 0453 06/10/20 1319 06/11/20 0404 06/11/20 0404 06/11/20 1436 06/11/20 1928 06/12/20 0559 06/12/20 1332  HGB 7.8*  --  7.6*  --   --   --  8.0*  --   HCT 23.1*  --  23.5*  --   --   --  26.4*  --   PLT 177  --  180  --   --   --  172  --   LABPROT 20.4*  --  17.0*  --   --   --  18.7*  --   INR 1.8*  --  1.4*  --   --   --  1.6*  --   HEPARINUNFRC 0.55   < > 0.13*   < >  --  0.29* 0.47 0.41  CREATININE 1.19*  --  0.91  --  0.88  --  1.00  --    < > = values in this interval not displayed.    Estimated Creatinine Clearance: 60.4 mL/min (by C-G formula based on SCr of 1 mg/dL).   Medical History: Past Medical History:  Diagnosis Date  . Acute diastolic heart failure ([place])   . Allergy   . 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 Asthma   . Atrial fibrillation ([place])   . Backache, unspecified   . Cancer ([place])    skin  . Cardiomegaly   . COPD (chronic obstructive pulmonary disease) ([place])   . Diabetes mellitus without complication ([place])   . 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 Esophageal reflux   . Headache(784.0)   . Herpes zoster without mention of complication   . Hx: UTI (urinary tract infection)   . Hypertension    heart controlled w CHF  . Nontoxic uninodular goiter   . Obesity, unspecified   .  Osteoarthrosis, unspecified whether generalized or localized, unspecified site   . Unspecified sleep apnea   . Urine incontinence    hx of    Assessment: 74 year old female with afib on warfarin PTA. Patient also with mechanical mitral valve replacement with goal INR 2.5-3.5. Home regimen 7 mg Mon, Tues, Thurs, Fri and 8 mg Wed, Sat, Sun. Warfarin has been on hold for endoscopic procedure. Pharmacy now consulted to restart warfarin. Patient continues on heparin drip.  Date INR Dose 6/16 4.6 -- 6/17 3.3 -- 6/18 2.7 -- 6/19 1.8 -- 6/20 1.4 8 mg 6/21 1.6     Goal of Therapy:  Heparin level 0.3-0.7 units/ml  INR 2.5-3.5 Monitor platelets by anticoagulation protocol: Yes   Plan:  HL now therapeutic x 2. Will continue heparin drip at 1050 units/hr. HL and CBC with morning labs.  INR subtherapeutic. Have restarted warfarin. Will give another dose of 8 mg tonight. Plan to reduce home maintenance dose as INR supratherapeutic on admission. INR with  morning labs.  Tawnya Crook, PharmD 06/12/2020,3:04 PM

## 2020-06-12 NOTE — Evaluation (Addendum)
Clinical/Bedside Swallow Evaluation Patient Details  Name: Betty [name] MRN: [id number] Date of Birth: 06-Sep-1946  Today's Date: 06/12/2020 Time: SLP Start Time (ACUTE ONLY): 1330 SLP Stop Time (ACUTE ONLY): 1430 SLP Time Calculation (min) (ACUTE ONLY): 60 min  Past Medical History:  Past Medical History:  Diagnosis Date   Acute diastolic heart failure (HCC)    Allergy    ANCA-associated vasculitis (HCC)    Asthma    Atrial fibrillation ([place])    Backache, unspecified    Cancer ([place])    skin   Cardiomegaly    COPD (chronic obstructive pulmonary disease) ([place])    Diabetes mellitus without complication ([place])    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   Esophageal reflux    Headache(784.0)    Herpes zoster without mention of complication    Hx: UTI (urinary tract infection)    Hypertension    heart controlled w CHF   Nontoxic uninodular goiter    Obesity, unspecified    Osteoarthrosis, unspecified whether generalized or localized, unspecified site    Unspecified sleep apnea    Urine incontinence    hx of   Past Surgical History:  Past Surgical History:  Procedure Laterality Date   ABDOMINAL HYSTERECTOMY  1979   complete (for precancerous cells)   ABLATION  2011 & 2014   bladder botox  01/26/2018   CHOLECYSTECTOMY     CYSTOSCOPY WITH FULGERATION N/A 01/28/2018   Procedure: CYSTOSCOPY WITH FULGERATION AND CLOT EVACUATION;  Surgeon: Hollice Espy, MD;  Location: [facility] ORS;  Service: Urology;  Laterality: N/A;   ESOPHAGOGASTRODUODENOSCOPY N/A 06/11/2020   Procedure: ESOPHAGOGASTRODUODENOSCOPY (EGD);  Surgeon: Toledo, Benay Pike, MD;  Location: [facility] ENDOSCOPY;  Service: Gastroenterology;  Laterality: N/A;   Interstim Placement  2012   OOPHORECTOMY     HPI:  Pt is a 74 y.o. female with history of heart failure with preserved ejection fraction with ejection fraction of 55%, +interstitial lung disease, home O2 per  chart,+paroxysmal atrial fibrillation, GERD, COPD, Obesity, history of recurrent major depression disorder, history of diabetes, +ANCA associated vasculitis, history of pulmonary embolus, history of mechanical mitral valve replacements on chronic anticoagulation with warfarin with an INR goal between 2.5 and 3.5,  cardiac resynchronization therapy device in place, pulmonary hypertension, stage IV chronic kidney disease, history of sleep apnea on BiPAP. She presents to the [facility] ED today 06/07/20 with dark stools after calling her cardiologist who told her to come to our ER. 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 Post procedure, pt exhibited agitated and confused and has required Ativan but calmer today. NSG noted mild cough when drinking water via straw for meds.   Assessment / Plan / Recommendation Clinical Impression  Pt appears to present w/ a grossly functional oropharyngeal phase swallow during po trials at assessment today. Pt has current Confusion post hospitalization and procedure(EGD) and can be at increased risk for aspiration, dysphagia. During po trials today, No overt clinical s/s of aspiration noted w/ po trials; pt would benefit from general aspiration precautions and Supervision during oral intake, meals for supoort d/t overall weakness, confusion.  With trials today, pt required full positioning upright for oral intake. Pt required min+ verbal/visual/tactile cues for follow through w/ po tasks and self-feeding -- pt held the Cup to self-drink. Support was given throughout d/t weakness. Pt consumed trials w/ no consistent, overt clinical s/s of aspiration noted -  pt consumed ~6 ozs of thin liquids (NO Straw) w/ No decline in respiratory effort/status noted during/post trials; vocal quality clear, O2 sats 98-99%. Pt consumed trials of purees and soft solids moistened w/ no overt clinical s/s of  aspiration or significant oral phase deficits noted. Pt cleared orally given min Time for mastication. OM exam was [facility] w/ no unilateral weakness noted, few missing Dentition.  Recommend a Dysphagia level 3 (mech soft) diet w/ thin liquids w/ general aspiration precautions for ease of mastication and self-feeding; Pills in Puree for safer swallowing; support at meals w/ feeding and positioning. No further skilled ST services indicated at this time; NSG to reconsult if any new needs arise. Precautions posted in room; Education completed w/ family member present. NSG updated.  SLP Visit Diagnosis: Dysphagia, unspecified (R13.10) (min confusion)    Aspiration Risk   (reduced following precautions)    Diet Recommendation  Mech Soft diet(cut meats, moistened foods) w/ thin liquids -- NO Straw; general aspiration precautions; Feeding Support and positioning at meals. Reduce distractions at meals.  Medication Administration: Whole meds with puree (for safer swallowing)    Other  Recommendations Recommended Consults:  (Dietician) Oral Care Recommendations: Oral care BID;Oral care before and after PO;Staff/trained caregiver to provide oral care Other Recommendations:  (n/a)   Follow up Recommendations None      Frequency and Duration  (n/a)   (n/a)       Prognosis Prognosis for Safe Diet Advancement: Good      Swallow Study   General Date of Onset: 06/07/20 HPI: Pt is a 74 y.o. female with history of heart failure with preserved ejection fraction with ejection fraction of 55%, +interstitial lung disease, home O2 per chart,+paroxysmal atrial fibrillation, GERD, COPD, Obesity, history of recurrent major depression disorder, history of diabetes, +ANCA associated vasculitis, history of pulmonary embolus, history of mechanical mitral valve replacements on chronic anticoagulation with warfarin with an INR goal between 2.5 and 3.5,  cardiac resynchronization therapy device in place, pulmonary  hypertension, stage IV chronic kidney disease, history of sleep apnea on BiPAP. She presents to the [facility] ED today 06/07/20 with dark stools after calling her cardiologist who told her to come to our ER. 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 Post procedure, pt exhibited agitated and confused and has required Ativan but calmer today. NSG noted mild cough when drinking water via straw for meds. Type of Study: Bedside Swallow Evaluation Previous Swallow Assessment: none Diet Prior to this Study: Regular;Thin liquids Temperature Spikes Noted: No (wbc 5.8) Respiratory Status: Nasal cannula (3L) History of Recent Intubation: No Behavior/Cognition: Alert;Cooperative;Pleasant mood;Confused;Distractible;Requires cueing (Labile) Oral Cavity Assessment: Dry;Dried secretions Oral Care Completed by SLP: Yes Oral Cavity - Dentition: Adequate natural dentition;Missing dentition (few) Vision: Functional for self-feeding Self-Feeding Abilities: Able to feed self;Needs assist;Needs set up;Total assist Patient Positioning: Upright in bed (needed positioning) Baseline Vocal Quality: Normal Volitional Cough: Strong Volitional Swallow: Able to elicit    Oral/Motor/Sensory Function Overall Oral Motor/Sensory Function: Within functional limits   Ice Chips Ice chips: Within functional limits Presentation: Spoon (5 trials)   Thin Liquid Thin Liquid: Within functional limits Presentation: Cup;Self Fed (~6+ ozs) Other Comments: verbal cues to take single sips slowly     Nectar Thick Nectar Thick Liquid: Not tested   Honey Thick Honey Thick Liquid: Not tested   Puree Puree: Within functional limits Presentation: Spoon (fed; ~2-3 ozs)   Solid  Solid: Within functional limits (grossly wfl) Presentation: Spoon (fed; 8 trials) Other Comments: min increased oral phase time for full clearing; attention w/        Orinda Kenner, MS, CCC-SLP Yiselle Babich 06/12/2020,4:11 PM

## 2020-06-12 NOTE — Progress Notes (Signed)
[facility] Cardiology    SUBJECTIVE: Patient resting at this time.   Vitals:   06/12/20 0400 06/12/20 0500 06/12/20 0700 06/12/20 0728  BP: 94/63 100/60 112/64   Pulse: (!) 58 (!) 58  (!) 59  Resp: 20 20 18 17   Temp: 98 F (36.7 C)   (!) 95.1 F (35.1 C)  TempSrc: Axillary   Axillary  SpO2: 100% 100%  100%  Weight:  91.6 kg    Height:         Intake/Output Summary (Last 24 hours) at 06/12/2020 0910 Last data filed at 06/12/2020 2836 Gross per 24 hour  Intake 972 ml  Output 903 ml  Net 69 ml      PHYSICAL EXAM  General: Well developed, well nourished, in no acute distress, lying in bed HEENT:  Normocephalic and atramatic Neck:  No JVD.  Lungs: normal effort of breathing on room air. Heart: HRRR Abdomen: nondistended Msk:  No obvious deformities Extremities: No clubbing, cyanosis or edema.      LABS: Basic Metabolic Panel: Recent Labs    06/11/20 0404 06/11/20 0404 06/11/20 1436 06/12/20 0559  NA 139   < > 141 144  K 4.6   < > 4.6 4.3  CL 103   < > 105 106  CO2 33*   < > 29 30  GLUCOSE 212*   < > 224* 147*  BUN 37*   < > 28* 30*  CREATININE 0.91   < > 0.88 1.00  CALCIUM 8.0*   < > 8.3* 8.3*  MG 2.2  --   --  2.3  PHOS 2.2*   < > 2.4* 3.1   < > = values in this interval not displayed.   Liver Function Tests: Recent Labs    06/10/20 0453 06/11/20 1436  ALBUMIN 3.1* 3.3*   No results for input(s): LIPASE, AMYLASE in the last 72 hours. CBC: Recent Labs    06/11/20 0404 06/12/20 0559  WBC 8.5 5.8  HGB 7.6* 8.0*  HCT 23.5* 26.4*  MCV 93.3 97.4  PLT 180 172   Cardiac Enzymes: No results for input(s): CKTOTAL, CKMB, CKMBINDEX, TROPONINI in the last 72 hours. BNP: Invalid input(s): POCBNP D-Dimer: No results for input(s): DDIMER in the last 72 hours. Hemoglobin A1C: No results for input(s): HGBA1C in the last 72 hours. Fasting Lipid Panel: No results for input(s): CHOL, HDL, LDLCALC, TRIG, CHOLHDL, LDLDIRECT in the last 72 hours. Thyroid  Function Tests: No results for input(s): TSH, T4TOTAL, T3FREE, THYROIDAB in the last 72 hours.  Invalid input(s): FREET3 Anemia Panel: No results for input(s): VITAMINB12, FOLATE, FERRITIN, TIBC, IRON, RETICCTPCT in the last 72 hours.  CT HEAD WO CONTRAST  Result Date: 06/11/2020 CLINICAL DATA:  Altered mental status, unclear cause. EXAM: CT HEAD WITHOUT CONTRAST TECHNIQUE: Contiguous axial images were obtained from the base of the skull through the vertex without intravenous contrast. COMPARISON:  08/05/2019 FINDINGS: The study is motion degraded including severe motion near the skull base. Brain: Within limitations of motion, no acute large territory infarct, intracranial hemorrhage, mass, midline shift, or extra-axial fluid collection is identified. 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 Hypodensities in the cerebral white matter are grossly similar to the prior study and nonspecific but compatible with mild chronic small vessel ischemic disease. The ventricles and sulci are within normal limits for age. Vascular: Calcified atherosclerosis at the skull base. Skull: No gross fracture or destructive osseous lesion. Sinuses/Orbits: Visualized paranasal sinuses and mastoid air cells  are clear. Bilateral cataract extraction. Other: None. IMPRESSION: 1. Motion degraded examination without evidence of acute intracranial abnormality. 2. Mild chronic small vessel ischemic disease. Electronically Signed   By: Logan Bores M.D.   On: 06/11/2020 17:30    TELEMETRY: sinus rhythm  ASSESSMENT AND PLAN:  Active Problems:   Afib (HCC)   Shock ([place])    1.  Status post MVR with mechanical valve, warfarin and heparin restarted following regulation of gastric AVMs, INR subtherapeutic after being on hold, currently 1.6 2.  Paroxysmal atrial fibrillation, patient in sinus rhythm, on warfarin for stroke prevention 3.  Anemia/GI bleed, status post upper endoscopy, status post  coagulation of gastric AVMs 4.  Altered mental status after EGD, reportedly too agitated yesterday for head CT  Recommendations  1.  Agree with current therapy 2.  Check INR every morning 3.  Continue Coumadin, with target INR 2.5-3.5 4.  Continue heparin infusion until INR 2.5, then discontinue. 5.  No further cardiac diagnostics recommended at this time.  Clabe Seal, PA-C 06/12/2020 9:10 AM   Sign off for now; please call with any questions.

## 2020-06-12 NOTE — Progress Notes (Signed)
Pt is alert to self and place only at this time. Calls out frequently for different needs. Wants to be pulled up in the bed but is already as high as she can go. Wants her back scratched, lotion applied. Anxious and tearful. Paranoid and says that you have "messed me up", and "yall are gonna kill me". RN provided emotional support. Assisted with eating dinner and pt provided with peanut butter and crackers per request. Symmetrical in all extremities. Adequate urine output. Will continue to monitor.

## 2020-06-12 NOTE — Progress Notes (Signed)
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 Patient is s/p EGD with APC hemostasis of gastric arteriovenous malformations yesterday. Very stable from a bleeding standpoint. No pain in the abdomen. Patient mildly confused today. Otherwise NAD.   Objective: Vital signs in last 24 hours: Temp:  [95.1 F (35.1 C)-98.6 F (37 C)] 97.8 F (36.6 C) (06/21 1600) Pulse Rate:  [53-111] 95 (06/21 1730) Resp:  [16-23] 20 (06/21 1730) BP: (76-118)/(45-85) 118/58 (06/21 1730) SpO2:  [95 %-100 %] 100 % (06/21 1730) Weight:  [91.6 kg] 91.6 kg (06/21 0500) Blood pressure (!) 118/58, pulse 95, temperature 97.8 F (36.6 C), temperature source Tympanic, resp. rate 20, height 5\' 9"  (1.753 m), weight 91.6 kg, SpO2 100 %.    Intake/Output from previous day: 06/20 0701 - 06/21 0700 In: 1032.8 [I.V.:932.8; IV Piggyback:100] Out: 9622 [Urine:1100; Blood:3]  Intake/Output this shift: No intake/output data recorded.   General appearance:  Alert, mood dysthymic, NAD Resp:  CTA Cardio: Irreg reg NO gallop GI: Soft, nt, nd. Benign. BS+ Extremities:  Trace edema.  Lab Results: Results for orders placed or performed during the hospital encounter of 06/07/20 (from the past 24 hour(s))  Glucose, capillary     Status: Abnormal   Collection Time: 06/11/20  9:59 PM  Result Value Ref Range   Glucose-Capillary 150 (H) 70 - 99 mg/dL   Comment 1 Document in Chart   Protime-INR     Status: Abnormal   Collection Time: 06/12/20  5:59 AM  Result Value Ref Range   Prothrombin Time 18.7 (H) 11.4 - 15.2 seconds   INR 1.6 (H) 0.8 - 1.2  CBC     Status: Abnormal   Collection Time: 06/12/20  5:59 AM  Result Value Ref Range   WBC 5.8 4.0 - 10.5 K/uL   RBC 2.71 (L) 3.87 - 5.11 MIL/uL   Hemoglobin 8.0 (L) 12.0 - 15.0 g/dL   HCT 26.4 (L) 36 - 46 %   MCV 97.4 80.0 - 100.0 fL   MCH 29.5 26.0 - 34.0 pg   MCHC 30.3 30.0 - 36.0 g/dL    RDW 16.5 (H) 11.5 - 15.5 %   Platelets 172 150 - 400 K/uL   nRBC 0.0 0.0 - 0.2 %  Heparin level (unfractionated)     Status: None   Collection Time: 06/12/20  5:59 AM  Result Value Ref Range   Heparin Unfractionated 0.47 0.30 - 0.70 IU/mL  Basic metabolic panel     Status: Abnormal   Collection Time: 06/12/20  5:59 AM  Result Value Ref Range   Sodium 144 135 - 145 mmol/L   Potassium 4.3 3.5 - 5.1 mmol/L   Chloride 106 98 - 111 mmol/L   CO2 30 22 - 32 mmol/L   Glucose, Bld 147 (H) 70 - 99 mg/dL   BUN 30 (H) 8 - 23 mg/dL   Creatinine, Ser 1.00 0.44 - 1.00 mg/dL   Calcium 8.3 (L) 8.9 - 10.3 mg/dL   GFR calc non Af Amer 56 (L) >60 mL/min   GFR calc Af Amer >60 >60 mL/min   Anion gap 8 5 - 15  Magnesium     Status: None   Collection Time: 06/12/20  5:59 AM  Result Value Ref Range   Magnesium 2.3 1.7 - 2.4 mg/dL  Phosphorus     Status: None   Collection Time: 06/12/20  5:59 AM  Result Value Ref Range   Phosphorus 3.1  2.5 - 4.6 mg/dL  Glucose, capillary     Status: Abnormal   Collection Time: 06/12/20  7:38 AM  Result Value Ref Range   Glucose-Capillary 134 (H) 70 - 99 mg/dL  Glucose, capillary     Status: Abnormal   Collection Time: 06/12/20  1:20 PM  Result Value Ref Range   Glucose-Capillary 135 (H) 70 - 99 mg/dL  Heparin level (unfractionated)     Status: None   Collection Time: 06/12/20  1:32 PM  Result Value Ref Range   Heparin Unfractionated 0.41 0.30 - 0.70 IU/mL  Glucose, capillary     Status: Abnormal   Collection Time: 06/12/20  3:45 PM  Result Value Ref Range   Glucose-Capillary 125 (H) 70 - 99 mg/dL     Recent Labs    06/10/20 0453 06/11/20 0404 06/12/20 0559  WBC 8.2 8.5 5.8  HGB 7.8* 7.6* 8.0*  HCT 23.1* 23.5* 26.4*  PLT 177 180 172   BMET Recent Labs    06/11/20 0404 06/11/20 1436 06/12/20 0559  NA 139 141 144  K 4.6 4.6 4.3  CL 103 105 106  CO2 33* 29 30  GLUCOSE 212* 224* 147*  BUN 37* 28* 30*  CREATININE 0.91 0.88 1.00  CALCIUM 8.0*  8.3* 8.3*   LFT Recent Labs    06/11/20 1436  ALBUMIN 3.3*   PT/INR Recent Labs    06/11/20 0404 06/12/20 0559  LABPROT 17.0* 18.7*  INR 1.4* 1.6*   Hepatitis Panel No results for input(s): HEPBSAG, HCVAB, HEPAIGM, HEPBIGM in the last 72 hours. C-Diff No results for input(s): CDIFFTOX in the last 72 hours. No results for input(s): CDIFFPCR in the last 72 hours.   Studies/Results: CT HEAD WO CONTRAST  Result Date: 06/11/2020 CLINICAL DATA:  Altered mental status, unclear cause. EXAM: CT HEAD WITHOUT CONTRAST TECHNIQUE: Contiguous axial images were obtained from the base of the skull through the vertex without intravenous contrast. COMPARISON:  08/05/2019 FINDINGS: The study is motion degraded including severe motion near the skull base. Brain: Within limitations of motion, no acute large territory infarct, intracranial hemorrhage, mass, midline shift, or extra-axial fluid collection is identified. 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 Hypodensities in the cerebral white matter are grossly similar to the prior study and nonspecific but compatible with mild chronic small vessel ischemic disease. The ventricles and sulci are within normal limits for age. Vascular: Calcified atherosclerosis at the skull base. Skull: No gross fracture or destructive osseous lesion. Sinuses/Orbits: Visualized paranasal sinuses and mastoid air cells are clear. Bilateral cataract extraction. Other: None. IMPRESSION: 1. Motion degraded examination without evidence of acute intracranial abnormality. 2. Mild chronic small vessel ischemic disease. Electronically Signed   By: Logan Bores M.D.   On: 06/11/2020 17:30   DG Chest Port 1 View  Result Date: 06/12/2020 CLINICAL DATA:  Abnormal chest radiograph. Recent hospital admission for shock, gastrointestinal hemorrhage. EXAM: PORTABLE CHEST 1 VIEW COMPARISON:  06/08/2020 and CT chest 04/02/2016. FINDINGS: Patient is rotated.  Cardiopericardial silhouette is enlarged, stable. Thoracic aorta is calcified. There is worsening mixed interstitial and airspace opacification, basilar predominant, with bilateral pleural effusions. Right apical pleural thickening. 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 IMPRESSION: 1. Worsening mixed interstitial and airspace opacification, basilar dependent, with bilateral pleural effusions. Findings likely reflect congestive heart failure. Difficult to exclude pneumonia. 2. Question underlying chronic lung disease. Electronically Signed   By: Lorin Picket M.D.   On: 06/12/2020 11:48    Scheduled Inpatient Medications:   .  sodium chloride   Intravenous Once  . sodium chloride   Intravenous Once  . sodium chloride   Intravenous Once  . azaTHIOprine  100 mg Oral Daily  . Chlorhexidine Gluconate Cloth  6 each Topical Daily  . insulin aspart  0-5 Units Subcutaneous QHS  . insulin aspart  0-9 Units Subcutaneous TID WC  . mouth rinse  15 mL Mouth Rinse BID  . pantoprazole  40 mg Intravenous Q12H  . phosphorus  500 mg Oral Once  . rOPINIRole  3 mg Oral TID  . sodium chloride flush  10-40 mL Intracatheter Q12H  . sodium chloride flush  3 mL Intravenous Q12H  . Warfarin - Pharmacist Dosing Inpatient   Does not apply q1600    Continuous Inpatient Infusions:   . sodium chloride    . sodium chloride 5 mL/hr at 06/12/20 1755  . cefTRIAXone (ROCEPHIN)  IV Stopped (06/11/20 2119)  . dexmedetomidine (PRECEDEX) IV infusion Stopped (06/12/20 1220)  . heparin 1,050 Units/hr (06/12/20 1755)  . norepinephrine (LEVOPHED) Adult infusion Stopped (06/12/20 1220)    PRN Inpatient Medications:  sodium chloride, acetaminophen, docusate sodium, LORazepam, ondansetron (ZOFRAN) IV, oxyCODONE-acetaminophen, polyethylene glycol, sodium chloride flush, sodium chloride flush  74 y/o Caucasian female with a PMH ofatrial fibrillation on chronic anticoagulation with Warfarin, COPD, HTN, Hx of interstitial lung disease,  ANCA associated vasculitis, Hx of DVT, Hx of mitral valve replacement, and hx of diabetesadmitted for acute blood loss anemia  1. Acute blood loss anemia - Hgb stable now at 8.0.  2. Heme positive stool  3. Hx of recurrent UGIB - 2/2 gastric AVMs s/p APC yesterday, 06/11/2020.  4. Supratherapeutic INR - INR 4.6 on admission, cardiology following. Heparin started yesterday and INR 1.8 today. Pharmacy assisting with monitoring of heparin.   5.Acute renal failure with UTI- hospital following  Recommendations:  1. Stable from a GI standpoint with no precipitous drop in hemoglobin and no signs of overt gastrointestinal blood loss 2. Appreciate pharmacy assistance with IV heparin 3. Continue acid suppression. 4. Continue anticoagulation as per cardiology/pharmacy recommendations. 5. GI sign off. Call back if needed.  Cythnia Osmun K. Alice Reichert, M.D. 06/12/2020, 7:35 PM

## 2020-06-12 NOTE — Progress Notes (Signed)
Follow up - Critical Care Medicine Note  Patient Details:    Betty [name] is an 74 y.o. female admitted with Acute Blood Loss Anemia due to GIB with super therapuetic INR from Warfarin Therapy (therapy for Mechanical Valve) associated with  severe acute renal failure, along with UTI s/p EGD Day # 1   EVENTS:  6/21- Met with son at bedside reviewed hospital course and findings, answered qs and discussed short term care plan.    Lines/tubes : External Urinary Catheter (Active)  Collection Container Dedicated Suction Canister 06/09/20 1556  Securement Method Tape 06/09/20 1556  Site Assessment Clean;Intact;Dry 06/09/20 1556  Intervention Equipment Changed 06/09/20 1556  Output (mL) 250 mL 06/09/20 1646    Microbiology/Sepsis markers: Results for orders placed or performed during the hospital encounter of 06/07/20  SARS Coronavirus 2 by RT PCR (hospital order, performed in [facility] hospital lab) Nasopharyngeal Nasopharyngeal Swab     Status: None   Collection Time: 06/07/20  1:34 PM   Specimen: Nasopharyngeal Swab  Result Value Ref Range Status   SARS Coronavirus 2 NEGATIVE NEGATIVE Final    Comment: (NOTE) SARS-CoV-2 target nucleic acids are NOT DETECTED.  The SARS-CoV-2 RNA is generally detectable in upper and lower respiratory specimens during the acute phase of infection. The lowest concentration of SARS-CoV-2 viral copies this assay can detect is 250 copies / mL. A negative result does not preclude SARS-CoV-2 infection and should not be used as the sole basis for treatment or other patient management decisions.  A negative result may occur with improper specimen collection / handling, submission of specimen other than nasopharyngeal swab, presence of viral mutation(s) within the areas targeted by this assay, and inadequate number of viral copies (<250 copies / mL). A negative result must be combined with clinical observations, patient history, and epidemiological  information.  Fact Sheet for Patients:   StrictlyIdeas.no  Fact Sheet for Healthcare Providers: BankingDealers.co.za  This test is not yet approved or  cleared by the [place] FDA and has been authorized for detection and/or diagnosis of SARS-CoV-2 by FDA under an Emergency Use Authorization (EUA).  This EUA will remain in effect (meaning this test can be used) for the duration of the COVID-19 declaration under Section 564(b)(1) of the Act, 21 U.S.C. section 360bbb-3(b)(1), unless the authorization is terminated or revoked sooner.  Performed at [facility], [address] [place], [place] [address]   Blood Culture (routine x 2)     Status: None   Collection Time: 06/07/20  5:24 PM   Specimen: BLOOD  Result Value Ref Range Status   Specimen Description BLOOD BLOOD RIGHT HAND  Final   Special Requests   Final    BOTTLES DRAWN AEROBIC AND ANAEROBIC Blood Culture adequate volume   Culture   Final    NO GROWTH 5 DAYS Performed at [facility], [address] [place], [place] [address]    Report Status 06/12/2020 FINAL  Final  Urine culture     Status: Abnormal   Collection Time: 06/07/20  5:24 PM   Specimen: Urine, Clean Catch  Result Value Ref Range Status   Specimen Description   Final    URINE, CLEAN CATCH Performed at [facility], [address], [place], [place] [address]    Special Requests   Final    NONE Performed at [facility], [address] [place], [place] [address]    Culture >=100,000 COLONIES/mL KLEBSIELLA PNEUMONIAE (A)  Final   Report  Status 06/09/2020 FINAL  Final   Organism ID, Bacteria KLEBSIELLA PNEUMONIAE (A)  Final      Susceptibility   Klebsiella pneumoniae - MIC*    AMPICILLIN RESISTANT Resistant     CEFAZOLIN <=4 SENSITIVE Sensitive     CEFTRIAXONE <=0.25 SENSITIVE Sensitive     CIPROFLOXACIN <=0.25 SENSITIVE Sensitive     GENTAMICIN <=1 SENSITIVE  Sensitive     IMIPENEM <=0.25 SENSITIVE Sensitive     NITROFURANTOIN 64 INTERMEDIATE Intermediate     TRIMETH/SULFA <=20 SENSITIVE Sensitive     AMPICILLIN/SULBACTAM 4 SENSITIVE Sensitive     PIP/TAZO <=4 SENSITIVE Sensitive     * >=100,000 COLONIES/mL KLEBSIELLA PNEUMONIAE  MRSA PCR Screening     Status: None   Collection Time: 06/07/20  6:25 PM   Specimen: Nasopharyngeal  Result Value Ref Range Status   MRSA by PCR NEGATIVE NEGATIVE Final    Comment:        The GeneXpert MRSA Assay (FDA approved for NASAL specimens only), is one component of a comprehensive MRSA colonization surveillance program. It is not intended to diagnose MRSA infection nor to guide or monitor treatment for MRSA infections. Performed at [facility], [address], [place], [place] [address]     Anti-infectives:  Anti-infectives (From admission, onward)   Start     Dose/Rate Route Frequency Ordered Stop   06/07/20 2100  cefTRIAXone (ROCEPHIN) 1 g in sodium chloride 0.9 % 100 mL IVPB     Discontinue     1 g 200 mL/hr over 30 Minutes Intravenous Every 24 hours 06/07/20 1938 06/14/20 2059     Scheduled Meds: . sodium chloride   Intravenous Once  . sodium chloride   Intravenous Once  . sodium chloride   Intravenous Once  . azaTHIOprine  100 mg Oral Daily  . Chlorhexidine Gluconate Cloth  6 each Topical Daily  . insulin aspart  0-5 Units Subcutaneous QHS  . insulin aspart  0-9 Units Subcutaneous TID WC  . mouth rinse  15 mL Mouth Rinse BID  . pantoprazole  40 mg Intravenous Q12H  . phosphorus  500 mg Oral Once  . rOPINIRole  3 mg Oral TID  . sodium chloride flush  10-40 mL Intracatheter Q12H  . sodium chloride flush  3 mL Intravenous Q12H  . Warfarin - Pharmacist Dosing Inpatient   Does not apply q1600   Continuous Infusions: . sodium chloride    . sodium chloride 5 mL/hr at 06/12/20 1428  . cefTRIAXone (ROCEPHIN)  IV Stopped (06/11/20 2119)  . dexmedetomidine (PRECEDEX) IV infusion  Stopped (06/12/20 1220)  . heparin 1,050 Units/hr (06/12/20 1428)  . norepinephrine (LEVOPHED) Adult infusion Stopped (06/12/20 1220)   PRN Meds:.sodium chloride, acetaminophen, docusate sodium, LORazepam, ondansetron (ZOFRAN) IV, oxyCODONE-acetaminophen, polyethylene glycol, sodium chloride flush, sodium chloride flush  Best Practice/Protocols:  VTE px: Coumadin SUP px:IV Protonix Diet:Regular Diet  Consults: Treatment Team:  Teodoro Spray, MD Efrain Sella, MD    Studies: CT HEAD WO CONTRAST  Result Date: 06/11/2020 CLINICAL DATA:  Altered mental status, unclear cause. EXAM: CT HEAD WITHOUT CONTRAST TECHNIQUE: Contiguous axial images were obtained from the base of the skull through the vertex without intravenous contrast. COMPARISON:  08/05/2019 FINDINGS: The study is motion degraded including severe motion near the skull base. Brain: Within limitations of motion, no acute large territory infarct, intracranial hemorrhage, mass, midline shift, or extra-axial fluid collection is identified. 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 Hypodensities in the  cerebral white matter are grossly similar to the prior study and nonspecific but compatible with mild chronic small vessel ischemic disease. The ventricles and sulci are within normal limits for age. Vascular: Calcified atherosclerosis at the skull base. Skull: No gross fracture or destructive osseous lesion. Sinuses/Orbits: Visualized paranasal sinuses and mastoid air cells are clear. Bilateral cataract extraction. Other: None. IMPRESSION: 1. Motion degraded examination without evidence of acute intracranial abnormality. 2. Mild chronic small vessel ischemic disease. Electronically Signed   By: Logan Bores M.D.   On: 06/11/2020 17:30   DG Chest Port 1 View  Result Date: 06/12/2020 CLINICAL DATA:  Abnormal chest radiograph. Recent hospital admission for shock, gastrointestinal hemorrhage. EXAM: PORTABLE CHEST  1 VIEW COMPARISON:  06/08/2020 and CT chest 04/02/2016. FINDINGS: Patient is rotated. Cardiopericardial silhouette is enlarged, stable. Thoracic aorta is calcified. There is worsening mixed interstitial and airspace opacification, basilar predominant, with bilateral pleural effusions. Right apical pleural thickening. 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 IMPRESSION: 1. Worsening mixed interstitial and airspace opacification, basilar dependent, with bilateral pleural effusions. Findings likely reflect congestive heart failure. Difficult to exclude pneumonia. 2. Question underlying chronic lung disease. Electronically Signed   By: Lorin Picket M.D.   On: 06/12/2020 11:48   DG Chest Port 1 View  Result Date: 06/08/2020 CLINICAL DATA:  Shortness of breath, interval changes EXAM: PORTABLE CHEST 1 VIEW COMPARISON:  August 05, 2019 FINDINGS: The heart size and mediastinal contours are unchanged with cardiomegaly. A left-sided pacemaker is again noted. Prosthetic mitral valve and overlying median sternotomy wires are present. There is diffusely increased interstitial markings seen throughout both lungs as on the prior exam. There is also prominence of the central pulmonary vasculature. IMPRESSION: Cardiomegaly and pulmonary vascular congestion. Diffusely increased interstitial markings seen throughout both lungs, likely due to chronic lung changes. Electronically Signed   By: Prudencio Pair M.D.   On: 06/08/2020 04:21    SIGNIFICANT EVENTS 6/16: Admitted to ICU 6/20: S/p EGD showing multiple bleeding angiectasias in the stomach noted treated with argon plasma coagulation (APC). Also noted Gastritis 6/20: Post -op Delirium   CC  Follow up INR  Subjective:    Patient with worsening altered mental status post EGD but easily re-oriented  Objective:  Vital signs for last 24 hours: Temp:  [95.1 F (35.1 C)-98.6 F (37 C)] 98 F (36.7 C) (06/21 1300) Pulse Rate:  [58-111] 111 (06/21 1400) Resp:  [16-26] 16  (06/21 1430) BP: (76-115)/(45-85) 104/58 (06/21 1430) SpO2:  [95 %-100 %] 98 % (06/21 1400) Weight:  [91.6 kg] 91.6 kg (06/21 0500)  Intake/Output from previous day: 06/20 0701 - 06/21 0700 In: 1032.8 [I.V.:932.8; IV Piggyback:100] Out: 6144 [Urine:1100; Blood:3]  Intake/Output this shift: Total I/O In: 189.2 [I.V.:189.2] Out: -    PHYSICAL EXAMINATION General Appearance: Cchronically ill appearing female, sitting in bed, on chronic 3L [place], in No acute distress  Neuro:Atraumatic, normocephalic, neck supple,no JVD,  speech normal,  HEENT: PERRLA, no icterus Pulmonary: normal breath sounds, No wheezing.  CardiovascularNormal S1,S2.  No m/r/g.    Valve sounds crisp. Abdomen: Benign, Soft, non-tender, nondistended, no guarding or rebound tenderness Renal:  No costovertebral tenderness  GU:  Not performed at this time. Endoc: No evident thyromegaly Skin:   warm, no rashes, no ecchymosis  Extremities: normal, no cyanosis, clubbing. PSYCHIATRIC: Intermittent confusion  Results for orders placed or performed during the hospital encounter of 06/07/20 (from the past 24 hour(s))  Glucose, capillary     Status: Abnormal   Collection Time: 06/11/20  5:52 PM  Result Value Ref Range   Glucose-Capillary 186 (H) 70 - 99 mg/dL  Heparin level (unfractionated)     Status: Abnormal   Collection Time: 06/11/20  7:28 PM  Result Value Ref Range   Heparin Unfractionated 0.29 (L) 0.30 - 0.70 IU/mL  Glucose, capillary     Status: Abnormal   Collection Time: 06/11/20  9:59 PM  Result Value Ref Range   Glucose-Capillary 150 (H) 70 - 99 mg/dL   Comment 1 Document in Chart   Protime-INR     Status: Abnormal   Collection Time: 06/12/20  5:59 AM  Result Value Ref Range   Prothrombin Time 18.7 (H) 11.4 - 15.2 seconds   INR 1.6 (H) 0.8 - 1.2  CBC     Status: Abnormal   Collection Time: 06/12/20  5:59 AM  Result Value Ref Range   WBC 5.8 4.0 - 10.5 K/uL   RBC 2.71 (L) 3.87 - 5.11 MIL/uL   Hemoglobin  8.0 (L) 12.0 - 15.0 g/dL   HCT 26.4 (L) 36 - 46 %   MCV 97.4 80.0 - 100.0 fL   MCH 29.5 26.0 - 34.0 pg   MCHC 30.3 30.0 - 36.0 g/dL   RDW 16.5 (H) 11.5 - 15.5 %   Platelets 172 150 - 400 K/uL   nRBC 0.0 0.0 - 0.2 %  Heparin level (unfractionated)     Status: None   Collection Time: 06/12/20  5:59 AM  Result Value Ref Range   Heparin Unfractionated 0.47 0.30 - 0.70 IU/mL  Basic metabolic panel     Status: Abnormal   Collection Time: 06/12/20  5:59 AM  Result Value Ref Range   Sodium 144 135 - 145 mmol/L   Potassium 4.3 3.5 - 5.1 mmol/L   Chloride 106 98 - 111 mmol/L   CO2 30 22 - 32 mmol/L   Glucose, Bld 147 (H) 70 - 99 mg/dL   BUN 30 (H) 8 - 23 mg/dL   Creatinine, Ser 1.00 0.44 - 1.00 mg/dL   Calcium 8.3 (L) 8.9 - 10.3 mg/dL   GFR calc non Af Amer 56 (L) >60 mL/min   GFR calc Af Amer >60 >60 mL/min   Anion gap 8 5 - 15  Magnesium     Status: None   Collection Time: 06/12/20  5:59 AM  Result Value Ref Range   Magnesium 2.3 1.7 - 2.4 mg/dL  Phosphorus     Status: None   Collection Time: 06/12/20  5:59 AM  Result Value Ref Range   Phosphorus 3.1 2.5 - 4.6 mg/dL  Glucose, capillary     Status: Abnormal   Collection Time: 06/12/20  7:38 AM  Result Value Ref Range   Glucose-Capillary 134 (H) 70 - 99 mg/dL  Glucose, capillary     Status: Abnormal   Collection Time: 06/12/20  1:20 PM  Result Value Ref Range   Glucose-Capillary 135 (H) 70 - 99 mg/dL  Heparin level (unfractionated)     Status: None   Collection Time: 06/12/20  1:32 PM  Result Value Ref Range   Heparin Unfractionated 0.41 0.30 - 0.70 IU/mL  Glucose, capillary     Status: Abnormal   Collection Time: 06/12/20  3:45 PM  Result Value Ref Range   Glucose-Capillary 125 (H) 70 - 99 mg/dL     ASSESSMENT/PLAN   Acute GI Bleed in setting of Supratherapeutic INR -S/p EGD 6/20. 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 Also noted Gastritis. -Switch to Protonix po 40 mg BID -Cardiology recommends avoiding vitamin K and allowing INR to drift down naturally; if becomes unstable consider FFP -GI input appreciated  ACUTE Blood Loss Anemia -Monitor for S/Sx of bleeding -Trend CBC (follow H&H q6h) -SCD's for VTE Prophylaxis  -Transfuse for Hgb <8  Acute Altered Mental Status - Likely post- op delirium -No focal neurological deficit on exam to suggest neurological event , however if worsening symptoms noted can consider CT head -Will check Ammonia -Neuro checks per protocol  Hypotension in setting of Hemorrhagic shock +/- Septic Shock Hx: Mechanical Valve on Warfarin, Paroxysmal A-fib, CHF, HTN -Continuous cardiac monitoring -Maintain MAP >65 -Cautious IV Fluids (given CHF hx) -Transfusions as indicated -Vasopressors if needed to maintain MAP goal -Trend lactic acid -Hold home Torsemide, BB, & Spironolactone given Hypotension & AKI -Started on Heparin and coumadin, will wean off Heparin once INR therapeutic -Trend INR ~ INR Goal 2.5-3.5 -Cardiology following appreciate input  ACUTE KIDNEY INJURY/Renal Failure - Renal function improving Anion Gap Metabolic Acidosis in setting of Lactic Acidosis -Monitor I&O's / urinary output -Follow BMP -Ensure adequate renal perfusion -Avoid nephrotoxic agents as able -Replace electrolytes as indicated -Trend lactic acid  UTI -Urine culture positive for Klebsiella Pneumoniae -Monitor fever curve -Trend WBC's & Procalcitonin -Continue  Rocephin  History of COPD & Asthma without acute exacerbation (chronically on 3L supplemental O2) Obstructive Sleep [place] -Supplemental O2 as needed to maintain O2 sats 88 to 92% -BiPAP qhs -Follow intermittent CXR & ABG as needed -PRN Bronchodilators  Diabetes Mellitus -CBG's -SSI if needed -Follow ICU Hypo/hyperglycemia protocol -Hold home Metformin & Glipizide    Critical care provider statement:     Critical care time (minutes):  34   Critical care time was exclusive of:  Separately billable procedures and  treating other patients   Critical care was necessary to treat or prevent imminent or  life-threatening deterioration of the following conditions:  Acute blood loss anemia due to GI ectasia, supratheraputic INR with coagulopathy, Acute on chronic renal failure, cardiac diseae with MVR, multiple comorbid conditions.    Critical care was time spent personally by me on the following  activities:  Development of treatment plan with patient or surrogate,  discussions with consultants, evaluation of patient's response to  treatment, examination of patient, obtaining history from patient or  surrogate, ordering and performing treatments and interventions, ordering  and review of laboratory studies and re-evaluation of patient's condition   I assumed direction of critical care for this patient from another  provider in my specialty: no     Ottie Glazier, M.D.  [facility] [place]

## 2020-06-13 LAB — BASIC METABOLIC PANEL
Anion gap: 8 (ref 5–15)
BUN: 28 mg/dL — ABNORMAL HIGH (ref 8–23)
CO2: 28 mmol/L (ref 22–32)
Calcium: 8.4 mg/dL — ABNORMAL LOW (ref 8.9–10.3)
Chloride: 105 mmol/L (ref 98–111)
Creatinine, Ser: 0.98 mg/dL (ref 0.44–1.00)
GFR calc Af Amer: >60 mL/min (ref >60)
GFR calc non Af Amer: 57 mL/min — ABNORMAL LOW (ref >60)
Glucose, Bld: 140 mg/dL — ABNORMAL HIGH (ref 70–99)
Potassium: 3.9 mmol/L (ref 3.5–5.1)
Sodium: 141 mmol/L (ref 135–145)

## 2020-06-13 LAB — CBC
HCT: 25.3 % — ABNORMAL LOW (ref 36.0–46.0)
Hemoglobin: 7.7 g/dL — ABNORMAL LOW (ref 12.0–15.0)
MCH: 29.5 pg (ref 26.0–34.0)
MCHC: 30.4 g/dL (ref 30.0–36.0)
MCV: 96.9 fL (ref 80.0–100.0)
Platelets: 174 10*3/uL (ref 150–400)
RBC: 2.61 MIL/uL — ABNORMAL LOW (ref 3.87–5.11)
RDW: 15.9 % — ABNORMAL HIGH (ref 11.5–15.5)
WBC: 7.1 10*3/uL (ref 4.0–10.5)
nRBC: 0 % (ref 0.0–0.2)

## 2020-06-13 LAB — GLUCOSE, CAPILLARY
Glucose-Capillary: 68 mg/dL — ABNORMAL LOW (ref 70–99)
Glucose-Capillary: 214 mg/dL — ABNORMAL HIGH (ref 70–99)
Glucose-Capillary: 111 mg/dL — ABNORMAL HIGH (ref 70–99)
Glucose-Capillary: 180 mg/dL — ABNORMAL HIGH (ref 70–99)
Glucose-Capillary: 98 mg/dL (ref 70–99)

## 2020-06-13 LAB — PROTIME-INR
INR: 2.3 — ABNORMAL HIGH (ref 0.8–1.2)
Prothrombin Time: 24.5 seconds — ABNORMAL HIGH (ref 11.4–15.2)

## 2020-06-13 LAB — MAGNESIUM: Magnesium: 1.9 mg/dL (ref 1.7–2.4)

## 2020-06-13 LAB — PREPARE RBC (CROSSMATCH)

## 2020-06-13 LAB — HEPARIN LEVEL (UNFRACTIONATED): Heparin Unfractionated: 0.37 IU/mL (ref 0.30–0.70)

## 2020-06-13 LAB — PHOSPHORUS: Phosphorus: 3 mg/dL (ref 2.5–4.6)

## 2020-06-13 MED ORDER — MELATONIN 5 MG PO TABS
5.0000 mg | ORAL_TABLET | Freq: Every day | ORAL | Status: DC
  Administered 2020-06-13 – 2020-06-18 (×4): 5 mg via ORAL
  Filled 2020-06-13 (×6): qty 1

## 2020-06-13 MED ORDER — WARFARIN SODIUM 2 MG PO TABS
2.0000 mg | ORAL_TABLET | Freq: Once | ORAL | Status: AC
  Administered 2020-06-13: 2 mg via ORAL
  Filled 2020-06-13: qty 1

## 2020-06-13 MED ORDER — SODIUM CHLORIDE 0.9% IV SOLUTION: Freq: Once | INTRAVENOUS | Status: DC

## 2020-06-13 MED ORDER — PANTOPRAZOLE SODIUM 40 MG PO TBEC
40.0000 mg | DELAYED_RELEASE_TABLET | Freq: Every day | ORAL | Status: DC
  Administered 2020-06-14 – 2020-06-19 (×6): 40 mg via ORAL
  Filled 2020-06-13 (×6): qty 1

## 2020-06-13 MED ORDER — SODIUM CHLORIDE 0.9% IV SOLUTION: Freq: Once | INTRAVENOUS | Status: AC

## 2020-06-13 NOTE — Progress Notes (Signed)
°   06/13/20 1255  Clinical Encounter Type  Visited With Patient;Health care provider  Visit Type Initial  Referral From Chaplain  Consult/Referral To Chaplain  As chaplain walked the hall of ICU she notice patient was sitting up eating lunch and stopped in. Chaplain said hello and introduced herself. Shortly after chaplain entered the room, patient started crying. Chaplain held and rubbed her hand. Patient told chaplain not to leave her. Speech therapist came in and situated patient in the bed and her and chaplain left.

## 2020-06-13 NOTE — Progress Notes (Addendum)
Speech Language Pathology Treatment: Dysphagia  Patient Details Name: Betty [name] MRN: [id number] DOB: October 21, 1946 Today's Date: 06/13/2020 Time: [id number] SLP Time Calculation (min) (ACUTE ONLY): 35 min  Assessment / Plan / Recommendation Clinical Impression  Pt seen for ongoing assessment and monitoring of status and toleration of diet initiated yesterday. She appears improved and alert today; verbally responsive and able to follow instructions w/ min direction. Pt is on [place] O2 support (chronic home use per chart). Pt exhibits min Lability and often requires encouragement - she has some degree of Anxiety and Depression per chart notes/NSG; multiple medical issues including GERD.  Pt given education then practice w/ general aspiration precautions during po trials of thin liquids (via CUP) and soft solids. She fed self independently given setup support. She followed aspiration precautions including need for sitting upright for all oral intake. No overt clinical s/s of aspiration were noted w/ any consistency; respiratory status remained calm and unlabored(o2 sats upper 90's), vocal quality clear b/t trials. Pt held Cup independently when drinking -- she consumed ~6 ozs of thin liquids total of juice and tea. NO straws were utiilized for better oral control. Oral phase appeared grossly [facility] for bolus management and timely A-P transfer for swallowing; oral clearing achieved w/ all consistencies.  Recommend continue a Dysphagia level 3 diet (mech soft) w/ gravies added to moisten foods for ease of self-feeding; Thin liquids -- NO STRAWS. Recommend general aspiration precautions; Pills Whole in Puree; tray setup and positioning assistance for meals.  ST services is available for any new swallowing issues while admitted w/ NSG to reconsult as needed. NSG updated and agreed - also informed NSG pt reported a "headache". Precautions posted at bedside.       HPI HPI: Pt is a 74 y.o. female with history of  heart failure with preserved ejection fraction with ejection fraction of 55%, +interstitial lung disease, home O2 per chart,+paroxysmal atrial fibrillation, GERD, COPD, Obesity, history of recurrent major depression disorder, history of diabetes, +ANCA associated vasculitis, history of pulmonary embolus, history of mechanical mitral valve replacements on chronic anticoagulation with warfarin with an INR goal between 2.5 and 3.5,  cardiac resynchronization therapy device in place, pulmonary hypertension, stage IV chronic kidney disease, history of sleep apnea on BiPAP. She [facility] to the [facility] ED today [facility]/07/20 with dark stools after calling her cardiologist who told her to come to our ER. 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 Post procedure, pt exhibited agitated and confused and has required Ativan but calmer today. NSG noted mild cough when drinking water via straw for meds.      SLP Plan  All goals met       Recommendations  Diet recommendations: Dysphagia 3 (mechanical soft);Thin liquid Liquids provided via: Cup;No straw Medication Administration: Whole meds with puree (for safer swallowing) Supervision: Patient able to self feed;Intermittent supervision to cue for compensatory strategies (setup support) Compensations: Minimize environmental distractions;Slow rate;Small sips/bites;Follow solids with liquid Postural Changes and/or Swallow Maneuvers: Seated upright 90 degrees;Upright 30-60 min after meal                General recommendations:  (Dietician f/u as needed) Oral Care Recommendations: Oral care BID;Oral care before and after PO;Staff/trained caregiver to provide oral care Follow up Recommendations: None SLP Visit Diagnosis: Dysphagia, unspecified (R13.10) Plan: All goals met       GO  Orinda Kenner, MS, CCC-SLP Angello Chien 06/13/2020, 1:58  PM

## 2020-06-13 NOTE — H&P (Deleted)
This is a no charge note   PCCM pick up from Dr. Lanney Gins  74 year old lady with past medical history of mechanical valve replacement on Coumadin, hypertension, diabetes mellitus, COPD on 3 L oxygen, asthma, GERD, atrial fibrillation, dCHF, who presents with GI bleeding in the setting of supratherapeutic INR, AKI, UTI.  Patient had EGD on 6/20 which showed AVM.  Patient was transfused with blood.  Hemoglobin 7.7 today.  Patient is stabilized.  INR 2.3.  Currently blood pressure 113/79.  We need to pick up this patient tomorrow morning   Ivor Costa, MD  [facility]   If 7PM-7AM, please contact night-coverage [email or web address] 06/13/2020, 8:41 AM

## 2020-06-13 NOTE — Care Management (Signed)
  This is a no charge note   PCCM pick up from Dr. Lanney Gins  74 year old lady with past medical history of mechanical valve replacement on Coumadin, hypertension, diabetes mellitus, COPD on 3 L oxygen, asthma, GERD, atrial fibrillation, dCHF, who presents with GI bleeding in the setting of supratherapeutic INR, AKI, UTI.  Patient had EGD on 6/20 which showed AVM.  Patient was transfused with blood.  Hemoglobin 7.7 today.  Patient is stabilized.  INR 2.3.  Currently blood pressure 113/79.  We need to pick up this patient tomorrow morning   Ivor Costa, MD  [facility]   If 7PM-7AM, please contact night-coverage [email or web address] 06/13/2020, 8:41 AM

## 2020-06-13 NOTE — Progress Notes (Signed)
[place] for heparin + warfarin Indication: afib/mechanical mitral valve   Allergies  Allergen Reactions  . Flecainide Shortness Of Breath and Other (See Comments)    Reaction: dizziness   . Amiodarone Other (See Comments)    Pt states that this medication causes lung bleeding.      Patient Measurements: Height: 5\' 9"  (175.3 cm) Weight: 95.5 kg (210 lb 8.6 oz) IBW/kg (Calculated) : 66.2 Heparin Dosing Weight: 84.9 kg  Vital Signs: Temp: 97 F (36.1 C) (06/22 0730) Temp Source: Oral (06/22 0730) BP: 113/79 (06/22 0800) Pulse Rate: 74 (06/22 0800)  Labs: Recent Labs     0000 06/11/20 0404 06/11/20 1436 06/11/20 1928 06/12/20 0559 06/12/20 1332 06/13/20 0422  HGB   < > 7.6*  --   --  8.0*  --  7.7*  HCT  --  23.5*  --   --  26.4*  --  25.3*  PLT  --  180  --   --  172  --  174  LABPROT  --  17.0*  --   --  18.7*  --  24.5*  INR  --  1.4*  --   --  1.6*  --  2.3*  HEPARINUNFRC  --  0.13*  --    < > 0.47 0.41 0.37  CREATININE  --  0.91 0.88  --  1.00  --   --    < > = values in this interval not displayed.    Estimated Creatinine Clearance: 61.6 mL/min (by C-G formula based on SCr of 1 mg/dL).   Medical History: Past Medical History:  Diagnosis Date  . Acute diastolic heart failure ([place])   . Allergy   . 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 Asthma   . Atrial fibrillation ([place])   . Backache, unspecified   . Cancer ([place])    skin  . Cardiomegaly   . COPD (chronic obstructive pulmonary disease) ([place])   . Diabetes mellitus without complication ([place])   . 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 Esophageal reflux   . Headache(784.0)   . Herpes zoster without mention of complication   . Hx: UTI (urinary tract infection)   . Hypertension    heart controlled w CHF  . Nontoxic uninodular goiter   . Obesity, unspecified   . Osteoarthrosis, unspecified whether generalized or localized, unspecified  site   . Unspecified sleep apnea   . Urine incontinence    hx of    Assessment: 74 year old female with afib on warfarin PTA. Patient also with mechanical mitral valve replacement with goal INR 2.5-3.5. Home regimen 7 mg Mon, Tues, Thurs, Fri and 8 mg Wed, Sat, Sun. Warfarin has been on hold for endoscopic procedure. Pharmacy now consulted to restart warfarin. Patient continues on heparin drip.  Date INR Dose 6/16 4.6 -- 6/17 3.3 -- 6/18 2.7 -- 6/19 1.8 -- 6/20 1.4 8 mg 6/21 1.6 8 mg 6/22 2.3    Goal of Therapy:  Heparin level 0.3-0.7 units/ml  INR 2.5-3.5 Monitor platelets by anticoagulation protocol: Yes   Plan:  HL remains therapeutic. Will continue heparin drip at 1050 units/hr. HL and CBC with morning labs.  INR subtherapeutic, but with significant jump. Will give small dose of warfarin 2 mg tonight. INR with morning labs.  Tawnya Crook, PharmD 06/13/2020,8:42 AM

## 2020-06-13 NOTE — Progress Notes (Addendum)
Pt Alert and oriented to self and place this shift. VSS. Anxious but appears to feel better than yesterday. Tylenol administered for headache. Afebrile. Symmetrical in all extremities. One unit of PRBCs administered per order. No signs of active bleeding at this time. Will wait for floor bed and continue to monitor.

## 2020-06-13 NOTE — Progress Notes (Signed)
[place] for heparin + warfarin Indication: afib/mechanical mitral valve   Allergies  Allergen Reactions  . Flecainide Shortness Of Breath and Other (See Comments)    Reaction: dizziness   . Amiodarone Other (See Comments)    Pt states that this medication causes lung bleeding.      Patient Measurements: Height: 5\' 9"  (175.3 cm) Weight: 91.6 kg (201 lb 15.1 oz) IBW/kg (Calculated) : 66.2 Heparin Dosing Weight: 84.9 kg  Vital Signs: Temp: 98.1 F (36.7 C) (06/22 0442) Temp Source: Oral (06/22 0442) BP: 99/44 (06/22 0442) Pulse Rate: 61 (06/22 0442)  Labs: Recent Labs     0000 06/11/20 0404 06/11/20 1436 06/11/20 1928 06/12/20 0559 06/12/20 1332 06/13/20 0422  HGB   < > 7.6*  --   --  8.0*  --  7.7*  HCT  --  23.5*  --   --  26.4*  --  25.3*  PLT  --  180  --   --  172  --  174  LABPROT  --  17.0*  --   --  18.7*  --  24.5*  INR  --  1.4*  --   --  1.6*  --  2.3*  HEPARINUNFRC  --  0.13*  --    < > 0.47 0.41 0.37  CREATININE  --  0.91 0.88  --  1.00  --   --    < > = values in this interval not displayed.    Estimated Creatinine Clearance: 60.4 mL/min (by C-G formula based on SCr of 1 mg/dL).   Medical History: Past Medical History:  Diagnosis Date  . Acute diastolic heart failure ([place])   . Allergy   . 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 Asthma   . Atrial fibrillation ([place])   . Backache, unspecified   . Cancer ([place])    skin  . Cardiomegaly   . COPD (chronic obstructive pulmonary disease) ([place])   . Diabetes mellitus without complication ([place])   . 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 Esophageal reflux   . Headache(784.0)   . Herpes zoster without mention of complication   . Hx: UTI (urinary tract infection)   . Hypertension    heart controlled w CHF  . Nontoxic uninodular goiter   . Obesity, unspecified   . Osteoarthrosis, unspecified whether generalized or localized, unspecified  site   . Unspecified sleep apnea   . Urine incontinence    hx of    Assessment: 74 year old female with afib on warfarin PTA. Patient also with mechanical mitral valve replacement with goal INR 2.5-3.5. Home regimen 7 mg Mon, Tues, Thurs, Fri and 8 mg Wed, Sat, Sun. Warfarin has been on hold for endoscopic procedure. Pharmacy now consulted to restart warfarin. Patient continues on heparin drip.  Date INR Dose 6/16 4.6 -- 6/17 3.3 -- 6/18 2.7 -- 6/19 1.8 -- 6/20 1.4 8 mg 6/21 1.6     Goal of Therapy:  Heparin level 0.3-0.7 units/ml  INR 2.5-3.5 Monitor platelets by anticoagulation protocol: Yes   Plan:  HL now therapeutic x 2. Will continue heparin drip at 1050 units/hr. HL and CBC with morning labs.  INR subtherapeutic. Have restarted warfarin. Will give another dose of 8 mg tonight. Plan to reduce home maintenance dose as INR supratherapeutic on admission. INR with morning labs.  6/22:  HL @ 0422 = 0.37  Will continue pt on current rate.  Valisa Karpel D, PharmD 06/13/2020,4:54 AM

## 2020-06-13 NOTE — Progress Notes (Signed)
Follow up - Critical Care Medicine Note  Patient Details:    Betty [name] is an 74 y.o. female admitted with Acute Blood Loss Anemia due to GIB with super therapuetic INR from Warfarin Therapy (therapy for Mechanical Valve) associated with  severe acute renal failure, along with UTI s/p EGD Day # 1   EVENTS:  6/21- Met with son at bedside reviewed hospital course and findings, answered qs and discussed short term care plan.  6/22- Patient clinically improved, mentation imprved, optimizing for transfer to [facility] today.   Lines/tubes : External Urinary Catheter (Active)  Collection Container Dedicated Suction Canister 06/09/20 1556  Securement Method Tape 06/09/20 1556  Site Assessment Clean;Intact;Dry 06/09/20 1556  Intervention Equipment Changed 06/09/20 1556  Output (mL) 250 mL 06/09/20 1646    Microbiology/Sepsis markers: Results for orders placed or performed during the hospital encounter of 06/07/20  SARS Coronavirus 2 by RT PCR (hospital order, performed in [facility] hospital lab) Nasopharyngeal Nasopharyngeal Swab     Status: None   Collection Time: 06/07/20  1:34 PM   Specimen: Nasopharyngeal Swab  Result Value Ref Range Status   SARS Coronavirus 2 NEGATIVE NEGATIVE Final    Comment: (NOTE) SARS-CoV-2 target nucleic acids are NOT DETECTED.  The SARS-CoV-2 RNA is generally detectable in upper and lower respiratory specimens during the acute phase of infection. The lowest concentration of SARS-CoV-2 viral copies this assay can detect is 250 copies / mL. A negative result does not preclude SARS-CoV-2 infection and should not be used as the sole basis for treatment or other patient management decisions.  A negative result may occur with improper specimen collection / handling, submission of specimen other than nasopharyngeal swab, presence of viral mutation(s) within the areas targeted by this assay, and inadequate number of viral copies (<250 copies / mL). A negative  result must be combined with clinical observations, patient history, and epidemiological information.  Fact Sheet for Patients:   StrictlyIdeas.no  Fact Sheet for Healthcare Providers: BankingDealers.co.za  This test is not yet approved or  cleared by the [place] FDA and has been authorized for detection and/or diagnosis of SARS-CoV-2 by FDA under an Emergency Use Authorization (EUA).  This EUA will remain in effect (meaning this test can be used) for the duration of the COVID-19 declaration under Section 564(b)(1) of the Act, 21 U.S.C. section 360bbb-3(b)(1), unless the authorization is terminated or revoked sooner.  Performed at [facility], [address] [place], [place] [address]   Blood Culture (routine x 2)     Status: None   Collection Time: 06/07/20  5:24 PM   Specimen: BLOOD  Result Value Ref Range Status   Specimen Description BLOOD BLOOD RIGHT HAND  Final   Special Requests   Final    BOTTLES DRAWN AEROBIC AND ANAEROBIC Blood Culture adequate volume   Culture   Final    NO GROWTH 5 DAYS Performed at [facility], [address], [place], [place] [address]    Report Status 06/12/2020 FINAL  Final  Urine culture     Status: Abnormal   Collection Time: 06/07/20  5:24 PM   Specimen: Urine, Clean Catch  Result Value Ref Range Status   Specimen Description   Final    URINE, CLEAN CATCH Performed at [facility], [address], [place], [place] [address]    Special Requests   Final    NONE Performed at [facility], [address], [place], [place] [address]  Culture >=100,000 COLONIES/mL KLEBSIELLA PNEUMONIAE (A)  Final   Report Status 06/09/2020 FINAL  Final   Organism ID, Bacteria KLEBSIELLA PNEUMONIAE (A)  Final      Susceptibility   Klebsiella pneumoniae - MIC*    AMPICILLIN RESISTANT Resistant     CEFAZOLIN <=4 SENSITIVE Sensitive     CEFTRIAXONE <=0.25  SENSITIVE Sensitive     CIPROFLOXACIN <=0.25 SENSITIVE Sensitive     GENTAMICIN <=1 SENSITIVE Sensitive     IMIPENEM <=0.25 SENSITIVE Sensitive     NITROFURANTOIN 64 INTERMEDIATE Intermediate     TRIMETH/SULFA <=20 SENSITIVE Sensitive     AMPICILLIN/SULBACTAM 4 SENSITIVE Sensitive     PIP/TAZO <=4 SENSITIVE Sensitive     * >=100,000 COLONIES/mL KLEBSIELLA PNEUMONIAE  MRSA PCR Screening     Status: None   Collection Time: 06/07/20  6:25 PM   Specimen: Nasopharyngeal  Result Value Ref Range Status   MRSA by PCR NEGATIVE NEGATIVE Final    Comment:        The GeneXpert MRSA Assay (FDA approved for NASAL specimens only), is one component of a comprehensive MRSA colonization surveillance program. It is not intended to diagnose MRSA infection nor to guide or monitor treatment for MRSA infections. Performed at [facility], [address], [place], [place] [address]     Anti-infectives:  Anti-infectives (From admission, onward)   Start     Dose/Rate Route Frequency Ordered Stop   06/07/20 2100  cefTRIAXone (ROCEPHIN) 1 g in sodium chloride 0.9 % 100 mL IVPB     Discontinue     1 g 200 mL/hr over 30 Minutes Intravenous Every 24 hours 06/07/20 1938 06/14/20 2059     Scheduled Meds: . sodium chloride   Intravenous Once  . sodium chloride   Intravenous Once  . sodium chloride   Intravenous Once  . azaTHIOprine  100 mg Oral Daily  . Chlorhexidine Gluconate Cloth  6 each Topical Daily  . insulin aspart  0-5 Units Subcutaneous QHS  . insulin aspart  0-9 Units Subcutaneous TID WC  . mouth rinse  15 mL Mouth Rinse BID  . phosphorus  500 mg Oral Once  . sodium chloride flush  10-40 mL Intracatheter Q12H  . sodium chloride flush  3 mL Intravenous Q12H  . Warfarin - Pharmacist Dosing Inpatient   Does not apply q1600   Continuous Infusions: . sodium chloride    . sodium chloride 5 mL/hr at 06/12/20 1900  . cefTRIAXone (ROCEPHIN)  IV 1 g (06/12/20 2117)  . heparin 1,050  Units/hr (06/13/20 0600)   PRN Meds:.sodium chloride, acetaminophen, docusate sodium, ondansetron (ZOFRAN) IV, polyethylene glycol, sodium chloride flush, sodium chloride flush  Best Practice/Protocols:  VTE px: Coumadin SUP px:IV Protonix Diet:Regular Diet  Consults: Treatment Team:  Teodoro Spray, MD    Studies: CT HEAD WO CONTRAST  Result Date: 06/11/2020 CLINICAL DATA:  Altered mental status, unclear cause. EXAM: CT HEAD WITHOUT CONTRAST TECHNIQUE: Contiguous axial images were obtained from the base of the skull through the vertex without intravenous contrast. COMPARISON:  08/05/2019 FINDINGS: The study is motion degraded including severe motion near the skull base. Brain: Within limitations of motion, no acute large territory infarct, intracranial hemorrhage, mass, midline shift, or extra-axial fluid collection is identified. 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 Hypodensities in the cerebral white matter are grossly similar to the prior study and nonspecific but compatible with mild chronic small vessel ischemic disease. The ventricles and sulci are within normal  limits for age. Vascular: Calcified atherosclerosis at the skull base. Skull: No gross fracture or destructive osseous lesion. Sinuses/Orbits: Visualized paranasal sinuses and mastoid air cells are clear. Bilateral cataract extraction. Other: None. IMPRESSION: 1. Motion degraded examination without evidence of acute intracranial abnormality. 2. Mild chronic small vessel ischemic disease. Electronically Signed   By: Logan Bores M.D.   On: 06/11/2020 17:30   DG Chest Port 1 View  Result Date: 06/12/2020 CLINICAL DATA:  Abnormal chest radiograph. Recent hospital admission for shock, gastrointestinal hemorrhage. EXAM: PORTABLE CHEST 1 VIEW COMPARISON:  06/08/2020 and CT chest 04/02/2016. FINDINGS: Patient is rotated. Cardiopericardial silhouette is enlarged, stable. Thoracic aorta is calcified.  There is worsening mixed interstitial and airspace opacification, basilar predominant, with bilateral pleural effusions. Right apical pleural thickening. 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 IMPRESSION: 1. Worsening mixed interstitial and airspace opacification, basilar dependent, with bilateral pleural effusions. Findings likely reflect congestive heart failure. Difficult to exclude pneumonia. 2. Question underlying chronic lung disease. Electronically Signed   By: Lorin Picket M.D.   On: 06/12/2020 11:48   DG Chest Port 1 View  Result Date: 06/08/2020 CLINICAL DATA:  Shortness of breath, interval changes EXAM: PORTABLE CHEST 1 VIEW COMPARISON:  August 05, 2019 FINDINGS: The heart size and mediastinal contours are unchanged with cardiomegaly. A left-sided pacemaker is again noted. Prosthetic mitral valve and overlying median sternotomy wires are present. There is diffusely increased interstitial markings seen throughout both lungs as on the prior exam. There is also prominence of the central pulmonary vasculature. IMPRESSION: Cardiomegaly and pulmonary vascular congestion. Diffusely increased interstitial markings seen throughout both lungs, likely due to chronic lung changes. Electronically Signed   By: Prudencio Pair M.D.   On: 06/08/2020 04:21    SIGNIFICANT EVENTS 6/16: Admitted to ICU 6/20: S/p EGD showing multiple bleeding angiectasias in the stomach noted treated with argon plasma coagulation (APC). Also noted Gastritis 6/20: Post -op Delirium   CC  Follow up INR  Subjective:    Patient with worsening altered mental status post EGD but easily re-oriented  Objective:  Vital signs for last 24 hours: Temp:  [96.3 F (35.7 C)-99.6 F (37.6 C)] 97 F (36.1 C) (06/22 0730) Pulse Rate:  [53-111] 74 (06/22 0800) Resp:  [14-24] 24 (06/22 0800) BP: (76-118)/(30-85) 113/79 (06/22 0800) SpO2:  [90 %-100 %] 100 % (06/22 0800) Weight:  [95.5 kg] 95.5 kg (06/22 0500)  Intake/Output from  previous day: 06/21 0701 - 06/22 0700 In: 905.3 [P.O.:240; I.V.:465.3; IV Piggyback:200] Out: 1500 [Urine:1500]  Intake/Output this shift: No intake/output data recorded.   PHYSICAL EXAMINATION General Appearance: Cchronically ill appearing female, sitting in bed, on chronic 3L , in No acute distress  Neuro:Atraumatic, normocephalic, neck supple,no JVD,  speech normal,  HEENT: PERRLA, no icterus Pulmonary: normal breath sounds, No wheezing.  CardiovascularNormal S1,S2.  No m/r/g.    Valve sounds crisp. Abdomen: Benign, Soft, non-tender, nondistended, no guarding or rebound tenderness Renal:  No costovertebral tenderness  GU:  Not performed at this time. Endoc: No evident thyromegaly Skin:   warm, no rashes, no ecchymosis  Extremities: normal, no cyanosis, clubbing. PSYCHIATRIC: mood is improved and patient is lucid and appropriate  Results for orders placed or performed during the hospital encounter of 06/07/20 (from the past 24 hour(s))  Glucose, capillary     Status: Abnormal   Collection Time: 06/12/20  1:20 PM  Result Value Ref Range   Glucose-Capillary 135 (H) 70 - 99 mg/dL  Heparin level (unfractionated)     Status:  None   Collection Time: 06/12/20  1:32 PM  Result Value Ref Range   Heparin Unfractionated 0.41 0.30 - 0.70 IU/mL  Glucose, capillary     Status: Abnormal   Collection Time: 06/12/20  3:45 PM  Result Value Ref Range   Glucose-Capillary 125 (H) 70 - 99 mg/dL  Glucose, capillary     Status: None   Collection Time: 06/12/20  9:27 PM  Result Value Ref Range   Glucose-Capillary 85 70 - 99 mg/dL  Protime-INR     Status: Abnormal   Collection Time: 06/13/20  4:22 AM  Result Value Ref Range   Prothrombin Time 24.5 (H) 11.4 - 15.2 seconds   INR 2.3 (H) 0.8 - 1.2  Heparin level (unfractionated)     Status: None   Collection Time: 06/13/20  4:22 AM  Result Value Ref Range   Heparin Unfractionated 0.37 0.30 - 0.70 IU/mL  CBC     Status: Abnormal   Collection  Time: 06/13/20  4:22 AM  Result Value Ref Range   WBC 7.1 4.0 - 10.5 K/uL   RBC 2.61 (L) 3.87 - 5.11 MIL/uL   Hemoglobin 7.7 (L) 12.0 - 15.0 g/dL   HCT 25.3 (L) 36 - 46 %   MCV 96.9 80.0 - 100.0 fL   MCH 29.5 26.0 - 34.0 pg   MCHC 30.4 30.0 - 36.0 g/dL   RDW 15.9 (H) 11.5 - 15.5 %   Platelets 174 150 - 400 K/uL   nRBC 0.0 0.0 - 0.2 %  Glucose, capillary     Status: Abnormal   Collection Time: 06/13/20  7:18 AM  Result Value Ref Range   Glucose-Capillary 68 (L) 70 - 99 mg/dL     ASSESSMENT/PLAN   Acute GI Bleed in setting of Supratherapeutic INR -S/p EGD 6/20. 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 Also noted Gastritis. -protonix switch to 40 po daily  -appreciate GI and cardio collaboration  ACUTE Blood Loss Anemia -Monitor for S/Sx of bleeding -Trend CBC (follow H&H q6h) -SCD's for VTE Prophylaxis  -Transfuse for Hgb <8  Acute Altered Mental Status - Likely post- op delirium -No focal neurological deficit on exam to suggest neurological event , however if worsening symptoms noted can consider CT head -Will check Ammonia -Neuro checks per protocol  Hypotension in setting of Hemorrhagic shock +/- Septic Shock Hx: Mechanical Valve on Warfarin, Paroxysmal A-fib, CHF, HTN -Continuous cardiac monitoring -Maintain MAP >65 -Cautious IV Fluids (given CHF hx) -Transfusions as indicated -Vasopressors if needed to maintain MAP goal -Trend lactic acid -Hold home Torsemide, BB, & Spironolactone given Hypotension & AKI -Started on Heparin and coumadin, will wean off Heparin once INR therapeutic -Trend INR ~ INR Goal 2.5-3.5 -Cardiology following appreciate input  ACUTE KIDNEY INJURY/Renal Failure - Renal function improving Anion Gap Metabolic Acidosis in setting of Lactic Acidosis -Monitor I&O's / urinary output -Follow BMP -Ensure adequate renal perfusion -Avoid  nephrotoxic agents as able -Replace electrolytes as indicated -Trend lactic acid  UTI -Urine culture positive for Klebsiella Pneumoniae -Monitor fever curve -Trend WBC's & Procalcitonin -Continue  Rocephin  History of COPD & Asthma without acute exacerbation (chronically on 3L supplemental O2) Obstructive Sleep [place] -Supplemental O2 as needed to maintain O2 sats 88 to 92% -BiPAP qhs -Follow intermittent CXR & ABG as needed -PRN Bronchodilators  Diabetes Mellitus -CBG's -SSI if needed -Follow ICU Hypo/hyperglycemia protocol -Hold home Metformin & Glipizide  Critical care provider statement:    Critical care time (minutes):  34   Critical care time was exclusive of:  Separately billable procedures and  treating other patients   Critical care was necessary to treat or prevent imminent or  life-threatening deterioration of the following conditions:  Acute blood loss anemia due to GI ectasia, supratheraputic INR with coagulopathy, Acute on chronic renal failure, cardiac diseae with MVR, multiple comorbid conditions.    Critical care was time spent personally by me on the following  activities:  Development of treatment plan with patient or surrogate,  discussions with consultants, evaluation of patient's response to  treatment, examination of patient, obtaining history from patient or  surrogate, ordering and performing treatments and interventions, ordering  and review of laboratory studies and re-evaluation of patient's condition   I assumed direction of critical care for this patient from another  provider in my specialty: no     Ottie Glazier, M.D.  Pulmonary & [place]

## 2020-06-14 DIAGNOSIS — I480 Paroxysmal atrial fibrillation: Secondary | ICD-10-CM

## 2020-06-14 LAB — HEPARIN LEVEL (UNFRACTIONATED): Heparin Unfractionated: 0.33 [IU]/mL (ref 0.30–0.70)

## 2020-06-14 LAB — CBC
HCT: 28.7 % — ABNORMAL LOW (ref 36.0–46.0)
Hemoglobin: 9.1 g/dL — ABNORMAL LOW (ref 12.0–15.0)
MCH: 29.4 pg (ref 26.0–34.0)
MCHC: 31.7 g/dL (ref 30.0–36.0)
MCV: 92.9 fL (ref 80.0–100.0)
Platelets: 211 10*3/uL (ref 150–400)
RBC: 3.09 MIL/uL — ABNORMAL LOW (ref 3.87–5.11)
RDW: 15.9 % — ABNORMAL HIGH (ref 11.5–15.5)
WBC: 8.4 10*3/uL (ref 4.0–10.5)
nRBC: 0 % (ref 0.0–0.2)

## 2020-06-14 LAB — TYPE AND SCREEN
ABO/RH(D): A POS
Antibody Screen: NEGATIVE
Unit division: 0

## 2020-06-14 LAB — BPAM RBC
Blood Product Expiration Date: 202107202359
ISSUE DATE / TIME: 202106221340
Unit Type and Rh: 6200

## 2020-06-14 LAB — PROTIME-INR
INR: 2.1 — ABNORMAL HIGH (ref 0.8–1.2)
Prothrombin Time: 22.4 s — ABNORMAL HIGH (ref 11.4–15.2)

## 2020-06-14 LAB — GLUCOSE, CAPILLARY
Glucose-Capillary: 166 mg/dL — ABNORMAL HIGH (ref 70–99)
Glucose-Capillary: 155 mg/dL — ABNORMAL HIGH (ref 70–99)
Glucose-Capillary: 198 mg/dL — ABNORMAL HIGH (ref 70–99)

## 2020-06-14 MED ORDER — WARFARIN SODIUM 5 MG PO TABS
5.0000 mg | ORAL_TABLET | Freq: Once | ORAL | Status: AC
  Administered 2020-06-14: 5 mg via ORAL
  Filled 2020-06-14: qty 1

## 2020-06-14 MED ORDER — POLYETHYLENE GLYCOL 3350 17 G PO PACK
17.0000 g | PACK | Freq: Every day | ORAL | Status: DC
  Administered 2020-06-14 – 2020-06-19 (×6): 17 g via ORAL
  Filled 2020-06-14 (×5): qty 1

## 2020-06-14 NOTE — Progress Notes (Signed)
INR remains subtherapeutic. Would continue with warfarin and heparin until INR is 2.5 or greater, and then discontinue heparin.

## 2020-06-14 NOTE — Progress Notes (Signed)
[place] for heparin + warfarin Indication: afib/mechanical mitral valve   Allergies  Allergen Reactions  . Flecainide Shortness Of Breath and Other (See Comments)    Reaction: dizziness   . Amiodarone Other (See Comments)    Pt states that this medication causes lung bleeding.      Patient Measurements: Height: 5\' 9"  (175.3 cm) Weight: 95.5 kg (210 lb 8.6 oz) IBW/kg (Calculated) : 66.2 Heparin Dosing Weight: 84.9 kg  Vital Signs: Temp: 97.9 F (36.6 C) (06/23 0753) Temp Source: Oral (06/23 0753) BP: 121/73 (06/23 0753) Pulse Rate: 74 (06/23 0753)  Labs: Recent Labs    06/11/20 1436 06/11/20 1928 06/12/20 0559 06/12/20 0559 06/12/20 1332 06/13/20 0422 06/13/20 0856 06/14/20 0629  HGB  --   --  8.0*   < >  --  7.7*  --  9.1*  HCT  --   --  26.4*  --   --  25.3*  --  28.7*  PLT  --   --  172  --   --  174  --  211  LABPROT  --   --  18.7*  --   --  24.5*  --  22.4*  INR  --   --  1.6*  --   --  2.3*  --  2.1*  HEPARINUNFRC  --    < > 0.47   < > 0.41 0.37  --  0.33  CREATININE 0.88  --  1.00  --   --   --  0.98  --    < > = values in this interval not displayed.    Estimated Creatinine Clearance: 62.9 mL/min (by C-G formula based on SCr of 0.98 mg/dL).   Medical History: Past Medical History:  Diagnosis Date  . Acute diastolic heart failure ([place])   . Allergy   . 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 Asthma   . Atrial fibrillation ([place])   . Backache, unspecified   . Cancer ([place])    skin  . Cardiomegaly   . COPD (chronic obstructive pulmonary disease) ([place])   . Diabetes mellitus without complication ([place])   . 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 Esophageal reflux   . Headache(784.0)   . Herpes zoster without mention of complication   . Hx: UTI (urinary tract infection)   . Hypertension    heart controlled w CHF  . Nontoxic uninodular goiter   . Obesity, unspecified   .  Osteoarthrosis, unspecified whether generalized or localized, unspecified site   . Unspecified sleep apnea   . Urine incontinence    hx of    Assessment: 74 year old female with afib on warfarin PTA.  presents with GI bleeding in the setting of supratherapeutic INR, AKI, UTI. 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  Patient also with mechanical mitral valve replacement with goal INR 2.5-3.5. Home regimen 7 mg Mon, Tues, Thurs, Fri and 8 mg Wed, Sat, Sun. Warfarin has been on hold for endoscopic procedure. Pharmacy now consulted to restart warfarin. Patient continues on heparin drip.  Date INR Dose 6/16 4.6 -- 6/17 3.3 -- 6/18 2.7 -- 6/19 1.8 -- 6/20 1.4 8 mg 6/21 1.6 8 mg 6/22 2.3 2 mg 6/23 2.1    Goal of Therapy:  Heparin level 0.3-0.7 units/ml  INR 2.5-3.5 Monitor platelets by anticoagulation protocol: Yes   Plan:  Heparin :6/23 0629 HL=0.33,  remains therapeutic. Will continue heparin drip at 1050 units/hr. HL and CBC with morning labs.  Warfarin: INR=2.1 subtherapeutic. Will order warfarin 5 mg tonight. INR with morning labs.  Nori Poland A, PharmD 06/14/2020,9:07 AM

## 2020-06-14 NOTE — Plan of Care (Signed)
  Problem: Education: Goal: Knowledge of General Education information will improve Description Including pain rating scale, medication(s)/side effects and non-pharmacologic comfort measures Outcome: Progressing   Problem: Health Behavior/Discharge Planning: Goal: Ability to manage health-related needs will improve Outcome: Progressing   

## 2020-06-14 NOTE — Progress Notes (Signed)
Intentional rounding by RRT RN on this patient s/p transfer out of CCU. Per bedside RN, no issues. RN instructed to call RRT if issues arise. 

## 2020-06-14 NOTE — Progress Notes (Signed)
Physical Therapy Treatment Patient Details Name: Betty [name] MRN: [id number] DOB: June 01, 1946 Today's Date: 06/14/2020    History of Present Illness per MD Note:73 yo WF with Acute Blood Loss Anemiadue to GIB with super therapuetic INR from Warfarin Therapy (therapy for Mechanical Valve) associated with severe acute renal failure, along with UTI.    PT Comments    Patient was in bed with HOB elevated when arriving. She was on 3L O2 [place]. Resting vitals 117/98 BP, 92% SpO2, HR 99. Pt was alert and cooperative throughout session. She was able to exit left side of the bed without physical assistance. Stood to [employer] with min assist plus verbal cues. Ambulated 20 feet, however, patient fatigued very quickly. Pt becomes SOB and required increase O2 supply to maintain greater than 90% saturation O2. Pt recovers to 93% back on 3L once in recliner. Therapist recommends d/c to SNF to address endurance, strength, and balance deficits. Conclusion of session, pt seated in recliner with nurse in room.     Follow Up Recommendations  SNF     Equipment Recommendations  Rolling walker with 5" wheels    Recommendations for Other Services       Precautions / Restrictions Precautions Precautions: Fall Precaution Comments: O2 desat/ SOB    Mobility  Bed Mobility Overal bed mobility: Modified Independent             General bed mobility comments: cues for safety  Transfers Overall transfer level: Needs assistance Equipment used: Rolling walker (2 wheeled) Transfers: Sit to/from Stand Sit to Stand: Min assist;From elevated surface         General transfer comment: pt was able to sit to stand from EOB to RW  Ambulation/Gait Ambulation/Gait assistance: Min assist Gait Distance (Feet): 20 Feet Assistive device: Rolling walker (2 wheeled) Gait Pattern/deviations: Step-through pattern;Trunk flexed;Narrow base of support Gait velocity: decreased    General Gait Details: pt was able to  ambulate 20' w/ RW plus O2. ambulation started w/ pt on 2L but had to increase to 4L to maintain > 90%. pt becomes very SOB w/ minimal gait distances   Stairs             Wheelchair Mobility    Modified Rankin (Stroke Patients Only)       Balance Overall balance assessment: Needs assistance Sitting-balance support: Feet supported;No upper extremity supported Sitting balance-Leahy Scale: Good Sitting balance - Comments: no loss of balance while sitting EOB   Standing balance support: Bilateral upper extremity supported;During functional activity Standing balance-Leahy Scale: Fair Standing balance comment: pt relies on RW especially once fatigue                            Cognition Arousal/Alertness: Awake/alert Behavior During Therapy: WFL for tasks assessed/performed Overall Cognitive Status: Within Functional Limits for tasks assessed                                 General Comments: pt alert/oriented x3      Exercises      General Comments        Pertinent Vitals/Pain Pain Assessment: No/denies pain Pain Score: 0-No pain Faces Pain Scale: No hurt Pain Intervention(s): Limited activity within patient's tolerance;Monitored during session    Home Living                      Prior  Function            PT Goals (current goals can now be found in the care plan section) Acute Rehab PT Goals Patient Stated Goal: go home Progress towards PT goals: Progressing toward goals    Frequency    Min 2X/week      PT Plan Current plan remains appropriate    Co-evaluation              AM-PAC PT "6 Clicks" Mobility   Outcome Measure  Help needed turning from your back to your side while in a flat bed without using bedrails?: A Little Help needed moving from lying on your back to sitting on the side of a flat bed without using bedrails?: A Little Help needed moving to and from a bed to a chair (including a wheelchair)?:  A Little Help needed standing up from a chair using your arms (e.g., wheelchair or bedside chair)?: A Little Help needed to walk in hospital room?: A Lot Help needed climbing 3-5 steps with a railing? : A Lot 6 Click Score: 16    End of Session Equipment Utilized During Treatment: Gait belt Activity Tolerance: Patient tolerated treatment well;Patient limited by fatigue Patient left: in chair;with chair alarm set;with call bell/phone within reach;with nursing/sitter in room Nurse Communication: Mobility status PT Visit Diagnosis: Unsteadiness on feet (R26.81);Muscle weakness (generalized) (M62.81);Difficulty in walking, not elsewhere classified (R26.2)     Time: 1430-1455 PT Time Calculation (min) (ACUTE ONLY): 25 min  Charges:  $Gait Training: 8-22 mins $Therapeutic Activity: 8-22 mins                     Julaine Fusi PTA 06/14/20, 3:49 PM

## 2020-06-14 NOTE — Progress Notes (Signed)
PROGRESS NOTE    Betty [name]  YQM:[id number] DOB: 11/13/1946 DOA: 06/07/2020 PCP: Dion Body, MD    Brief Narrative:  74 year old female with history of diastolic dysfunction, ejection fraction 55%, interstitial lung disease, paroxysmal atrial fibrillation, history of major depression, diabetes on insulin, vasculitis, PE, history of mitral valve repair on chronic anticoagulation therapy with Coumadin, pacemaker in place, stage IV chronic kidney disease and history of sleep apnea on BiPAP was admitted to the intensive care unit on 06/07/2020 with dark stools at home.  She was found with blood pressure 80/49, INR 4.6 hemoglobin 6.1 in the ER.  Urine consistent with UTI.  COVID-19 negative. COVID-19 negative Blood cultures 6/16, negative Urine culture 6/16, Klebsiella sensitive to ceftriaxone. 6/20, EGD showing multiple bleeding angiectasia in the stomach.  Developed some delirium postoperative on 6/28 that is improved.  Assessment & Plan:   Active Problems:   Afib (HCC)   Shock ([place])  Symptomatic anemia, acute GI bleeding in the setting of supratherapeutic INR: Received total 2 units of PRBC transfusion, hemoglobin is stable since then.  We will continue close monitoring. Underwent EGD on 6/20, found to have telangiectasia in the stomach treated with APC.  Followed by GI.  Currently on oral Protonix.  Altered mental status: Likely postop delirium.  Improved.  Hypotension in the setting of hemorrhagic shock: Improved.  Torsemide beta-blockers and aspirin lactone on hold.  Acute UTI present on admission: Klebsiella pneumonia.  Treated with Rocephin for 7 days.  Finished antibiotic therapy.  History of COPD and asthma without acute exacerbation, obstructive sleep apnea: On oxygen supplementation at home, continued.  Keep on oxygen to keep sats more than 88%.  As needed bronchodilators. BiPAP at night at home settings.  Type 2 diabetes: Fairly controlled on current dose of  insulin.  Mechanical mitral valve on Coumadin: INR is 2.1.  Goal 2.5-3.5.  No active bleeding.  Continue to redose Coumadin.  Heparin to be continued until INR is more than 2.5.  Acute kidney injury: Due to #1.  Improved adequately.  Normal renal functions.   DVT prophylaxis: SCDs Start: 06/07/20 1406 warfarin (COUMADIN) tablet 5 mg   Code Status: Full code Family Communication: None Disposition Plan: Status is: Inpatient  Remains inpatient appropriate because:Inpatient level of care appropriate due to severity of illness   Dispo: The patient is from: Home              Anticipated d/c is to: Home              Anticipated d/c date is: 2 days              Patient currently is not medically stable to d/c.  Patient has been resting in the bed until now.  Will discontinue bedrest orders.  PT OT to work with patient today.   Consultants:   Cardiology  Critical care  GI  Procedures:   EGD 6/20  Antimicrobials:  Antibiotics Given (last 72 hours)    Date/Time Action Medication Dose Rate   06/11/20 2049 New Bag/Given   cefTRIAXone (ROCEPHIN) 1 g in sodium chloride 0.9 % 100 mL IVPB 1 g 200 mL/hr   06/12/20 2117 New Bag/Given   cefTRIAXone (ROCEPHIN) 1 g in sodium chloride 0.9 % 100 mL IVPB 1 g 200 mL/hr   06/13/20 2117 New Bag/Given   cefTRIAXone (ROCEPHIN) 1 g in sodium chloride 0.9 % 100 mL IVPB 1 g 200 mL/hr         Subjective: Patient was  seen and examined.  ICU admission and medical course reviewed.  Patient herself denied any complaints.  She has not have bowel movement for last 5 days.  Patient stated that she has not used BiPAP for the last 7 days while she is in ICU.   She has not ambulated yet.  Heart rate remains well controlled.  Objective: Vitals:   06/13/20 2333 06/14/20 0332 06/14/20 0753 06/14/20 1430  BP: (!) 125/58 122/80 121/73 117/78  Pulse: 78 78 74 75  Resp: 19 18 17 18   Temp: 98.4 F (36.9 C) 98.2 F (36.8 C) 97.9 F (36.6 C) 97.9 F (36.6  C)  TempSrc: Oral Oral Oral Oral  SpO2: 97% 99% 100%   Weight:      Height:        Intake/Output Summary (Last 24 hours) at 06/14/2020 1535 Last data filed at 06/14/2020 0340 Gross per 24 hour  Intake 1018.79 ml  Output 600 ml  Net 418.79 ml   Filed Weights   06/11/20 0818 06/12/20 0500 06/13/20 0500  Weight: 91.8 kg 91.6 kg 95.5 kg    Examination:  General exam: Appears calm and comfortable, chronically sick looking.  Not in any distress. Patient is on 2 L of nasal cannula oxygen. She is alert oriented x4. Respiratory system: Clear to auscultation. Respiratory effort normal. Cardiovascular system: S1 & S2 heard, mechanical heart murmur present.   Gastrointestinal system: Abdomen is nondistended, soft and nontender. No organomegaly or masses felt. Normal bowel sounds heard. Central nervous system: Alert and oriented. No focal neurological deficits.  Data Reviewed: I have personally reviewed following labs and imaging studies  CBC: Recent Labs  Lab 06/10/20 0453 06/11/20 0404 06/12/20 0559 06/13/20 0422 06/14/20 0629  WBC 8.2 8.5 5.8 7.1 8.4  HGB 7.8* 7.6* 8.0* 7.7* 9.1*  HCT 23.1* 23.5* 26.4* 25.3* 28.7*  MCV 90.6 93.3 97.4 96.9 92.9  PLT 177 180 172 174 481   Basic Metabolic Panel: Recent Labs  Lab 06/09/20 0105 06/09/20 0105 06/10/20 0453 06/11/20 0404 06/11/20 1436 06/12/20 0559 06/13/20 0856  NA 137   < > 140 139 141 144 141  K 3.1*   < > 3.3* 4.6 4.6 4.3 3.9  CL 90*   < > 99 103 105 106 105  CO2 34*   < > 34* 33* 29 30 28   GLUCOSE 130*   < > 166* 212* 224* 147* 140*  BUN 113*   < > 75* 37* 28* 30* 28*  CREATININE 1.76*   < > 1.19* 0.91 0.88 1.00 0.98  CALCIUM 8.1*   < > 8.1* 8.0* 8.3* 8.3* 8.4*  MG 3.1*  --  2.5* 2.2  --  2.3 1.9  PHOS  --   --  2.5 2.2* 2.4* 3.1 3.0   < > = values in this interval not displayed.   GFR: Estimated Creatinine Clearance: 62.9 mL/min (by C-G formula based on SCr of 0.98 mg/dL). Liver Function Tests: Recent Labs    Lab 06/10/20 0453 06/11/20 1436  ALBUMIN 3.1* 3.3*   No results for input(s): LIPASE, AMYLASE in the last 168 hours. Recent Labs  Lab 06/11/20 1436  AMMONIA 20   Coagulation Profile: Recent Labs  Lab 06/10/20 0453 06/11/20 0404 06/12/20 0559 06/13/20 0422 06/14/20 0629  INR 1.8* 1.4* 1.6* 2.3* 2.1*   Cardiac Enzymes: No results for input(s): CKTOTAL, CKMB, CKMBINDEX, TROPONINI in the last 168 hours. BNP (last 3 results) No results for input(s): PROBNP in the last 8760 hours. HbA1C: No  results for input(s): HGBA1C in the last 72 hours. CBG: Recent Labs  Lab 06/13/20 0832 06/13/20 1105 06/13/20 1616 06/13/20 2036 06/14/20 1221  GLUCAP 214* 111* 180* 98 166*   Lipid Profile: No results for input(s): CHOL, HDL, LDLCALC, TRIG, CHOLHDL, LDLDIRECT in the last 72 hours. Thyroid Function Tests: No results for input(s): TSH, T4TOTAL, FREET4, T3FREE, THYROIDAB in the last 72 hours. Anemia Panel: No results for input(s): VITAMINB12, FOLATE, FERRITIN, TIBC, IRON, RETICCTPCT in the last 72 hours. Sepsis Labs: Recent Labs  Lab 06/07/20 1723 06/07/20 2053 06/08/20 0138 06/08/20 0639 06/09/20 0105  PROCALCITON  --   --   --  0.36 <0.10  LATICACIDVEN 6.7* 5.9* 2.3*  --   --     Recent Results (from the past 240 hour(s))  SARS Coronavirus 2 by RT PCR (hospital order, performed in [facility] hospital lab) Nasopharyngeal Nasopharyngeal Swab     Status: None   Collection Time: 06/07/20  1:34 PM   Specimen: Nasopharyngeal Swab  Result Value Ref Range Status   SARS Coronavirus 2 NEGATIVE NEGATIVE Final    Comment: (NOTE) SARS-CoV-2 target nucleic acids are NOT DETECTED.  The SARS-CoV-2 RNA is generally detectable in upper and lower respiratory specimens during the acute phase of infection. The lowest concentration of SARS-CoV-2 viral copies this assay can detect is 250 copies / mL. A negative result does not preclude SARS-CoV-2 infection and should not be used as the  sole basis for treatment or other patient management decisions.  A negative result may occur with improper specimen collection / handling, submission of specimen other than nasopharyngeal swab, presence of viral mutation(s) within the areas targeted by this assay, and inadequate number of viral copies (<250 copies / mL). A negative result must be combined with clinical observations, patient history, and epidemiological information.  Fact Sheet for Patients:   StrictlyIdeas.no  Fact Sheet for Healthcare Providers: BankingDealers.co.za  This test is not yet approved or  cleared by the [place] FDA and has been authorized for detection and/or diagnosis of SARS-CoV-2 by FDA under an Emergency Use Authorization (EUA).  This EUA will remain in effect (meaning this test can be used) for the duration of the COVID-19 declaration under Section 564(b)(1) of the Act, 21 U.S.C. section 360bbb-3(b)(1), unless the authorization is terminated or revoked sooner.  Performed at [facility], [address] [place], [place] [address]   Blood Culture (routine x 2)     Status: None   Collection Time: 06/07/20  5:24 PM   Specimen: BLOOD  Result Value Ref Range Status   Specimen Description BLOOD BLOOD RIGHT HAND  Final   Special Requests   Final    BOTTLES DRAWN AEROBIC AND ANAEROBIC Blood Culture adequate volume   Culture   Final    NO GROWTH 5 DAYS Performed at [facility], [address] [place], [place] [address]    Report Status 06/12/2020 FINAL  Final  Urine culture     Status: Abnormal   Collection Time: 06/07/20  5:24 PM   Specimen: Urine, Clean Catch  Result Value Ref Range Status   Specimen Description   Final    URINE, CLEAN CATCH Performed at [facility], [address], [place], [place] [address]    Special Requests   Final    NONE Performed at [facility], [address]  [place], [place] [address]    Culture >=100,000 COLONIES/mL KLEBSIELLA PNEUMONIAE (A)  Final   Report Status 06/09/2020 FINAL  Final   Organism ID, Bacteria KLEBSIELLA PNEUMONIAE (A)  Final      Susceptibility   Klebsiella pneumoniae - MIC*    AMPICILLIN RESISTANT Resistant     CEFAZOLIN <=4 SENSITIVE Sensitive     CEFTRIAXONE <=0.25 SENSITIVE Sensitive     CIPROFLOXACIN <=0.25 SENSITIVE Sensitive     GENTAMICIN <=1 SENSITIVE Sensitive     IMIPENEM <=0.25 SENSITIVE Sensitive     NITROFURANTOIN 64 INTERMEDIATE Intermediate     TRIMETH/SULFA <=20 SENSITIVE Sensitive     AMPICILLIN/SULBACTAM 4 SENSITIVE Sensitive     PIP/TAZO <=4 SENSITIVE Sensitive     * >=100,000 COLONIES/mL KLEBSIELLA PNEUMONIAE  MRSA PCR Screening     Status: None   Collection Time: 06/07/20  6:25 PM   Specimen: Nasopharyngeal  Result Value Ref Range Status   MRSA by PCR NEGATIVE NEGATIVE Final    Comment:        The GeneXpert MRSA Assay (FDA approved for NASAL specimens only), is one component of a comprehensive MRSA colonization surveillance program. It is not intended to diagnose MRSA infection nor to guide or monitor treatment for MRSA infections. Performed at [facility], [address], [place], [place] [address]          Radiology Studies: No results found.      Scheduled Meds:  sodium chloride   Intravenous Once   sodium chloride   Intravenous Once   sodium chloride   Intravenous Once   sodium chloride   Intravenous Once   azaTHIOprine  100 mg Oral Daily   Chlorhexidine Gluconate Cloth  6 each Topical Daily   insulin aspart  0-5 Units Subcutaneous QHS   insulin aspart  0-9 Units Subcutaneous TID WC   mouth rinse  15 mL Mouth Rinse BID   melatonin  5 mg Oral QHS   pantoprazole  40 mg Oral Daily   phosphorus  500 mg Oral Once   polyethylene glycol  17 g Oral Daily   sodium chloride flush  10-40 mL Intracatheter Q12H   sodium chloride flush  3 mL Intravenous  Q12H   warfarin  5 mg Oral ONCE-1600   Warfarin - Pharmacist Dosing Inpatient   Does not apply q1600   Continuous Infusions:  sodium chloride     sodium chloride Stopped (06/13/20 1629)   heparin 1,050 Units/hr (06/14/20 0054)     LOS: 7 days    Time spent: 25 minutes    Barb Merino, MD [facility] Pager [phone]

## 2020-06-15 LAB — GLUCOSE, CAPILLARY
Glucose-Capillary: 110 mg/dL — ABNORMAL HIGH (ref 70–99)
Glucose-Capillary: 97 mg/dL (ref 70–99)
Glucose-Capillary: 189 mg/dL — ABNORMAL HIGH (ref 70–99)
Glucose-Capillary: 98 mg/dL (ref 70–99)
Glucose-Capillary: 175 mg/dL — ABNORMAL HIGH (ref 70–99)

## 2020-06-15 LAB — CBC
HCT: 30.7 % — ABNORMAL LOW (ref 36.0–46.0)
Hemoglobin: 9.4 g/dL — ABNORMAL LOW (ref 12.0–15.0)
MCH: 28.7 pg (ref 26.0–34.0)
MCHC: 30.6 g/dL (ref 30.0–36.0)
MCV: 93.6 fL (ref 80.0–100.0)
Platelets: 221 10*3/uL (ref 150–400)
RBC: 3.28 MIL/uL — ABNORMAL LOW (ref 3.87–5.11)
RDW: 15.9 % — ABNORMAL HIGH (ref 11.5–15.5)
WBC: 7.4 10*3/uL (ref 4.0–10.5)
nRBC: 0 % (ref 0.0–0.2)

## 2020-06-15 LAB — PROTIME-INR
INR: 2 — ABNORMAL HIGH (ref 0.8–1.2)
Prothrombin Time: 21.7 seconds — ABNORMAL HIGH (ref 11.4–15.2)

## 2020-06-15 LAB — HEPARIN LEVEL (UNFRACTIONATED)
Heparin Unfractionated: 0.25 IU/mL — ABNORMAL LOW (ref 0.30–0.70)
Heparin Unfractionated: 0.63 IU/mL (ref 0.30–0.70)

## 2020-06-15 MED ORDER — DOXYLAMINE SUCCINATE (SLEEP) 25 MG PO TABS
25.0000 mg | ORAL_TABLET | Freq: Every evening | ORAL | Status: DC | PRN
  Administered 2020-06-15 – 2020-06-17 (×3): 25 mg via ORAL
  Filled 2020-06-15 (×4): qty 1

## 2020-06-15 MED ORDER — HEPARIN BOLUS VIA INFUSION
1300.0000 [IU] | Freq: Once | INTRAVENOUS | Status: AC
  Administered 2020-06-15: 1300 [IU] via INTRAVENOUS
  Filled 2020-06-15: qty 1300

## 2020-06-15 MED ORDER — WARFARIN SODIUM 6 MG PO TABS
6.0000 mg | ORAL_TABLET | Freq: Once | ORAL | Status: AC
  Administered 2020-06-15: 6 mg via ORAL
  Filled 2020-06-15: qty 1

## 2020-06-15 MED ORDER — GABAPENTIN 100 MG PO CAPS
100.0000 mg | ORAL_CAPSULE | Freq: Every day | ORAL | Status: DC
  Administered 2020-06-15 – 2020-06-18 (×4): 100 mg via ORAL
  Filled 2020-06-15 (×4): qty 1

## 2020-06-15 NOTE — Progress Notes (Signed)
[place] for heparin + warfarin Indication: afib/mechanical mitral valve   Allergies  Allergen Reactions  . Flecainide Shortness Of Breath and Other (See Comments)    Reaction: dizziness   . Amiodarone Other (See Comments)    Pt states that this medication causes lung bleeding.      Patient Measurements: Height: 5\' 9"  (175.3 cm) Weight: 95.5 kg (210 lb 8.6 oz) IBW/kg (Calculated) : 66.2 Heparin Dosing Weight: 84.9 kg  Vital Signs: Temp: 98.5 F (36.9 C) (06/24 1552) Temp Source: Oral (06/24 1552) BP: 110/60 (06/24 1552) Pulse Rate: 79 (06/24 1552)  Labs: Recent Labs    06/13/20 0422 06/13/20 0422 06/13/20 0856 06/14/20 0629 06/15/20 0511 06/15/20 1526  HGB 7.7*   < >  --  9.1* 9.4*  --   HCT 25.3*  --   --  28.7* 30.7*  --   PLT 174  --   --  211 221  --   LABPROT 24.5*  --   --  22.4* 21.7*  --   INR 2.3*  --   --  2.1* 2.0*  --   HEPARINUNFRC 0.37   < >  --  0.33 0.25* 0.63  CREATININE  --   --  0.98  --   --   --    < > = values in this interval not displayed.    Estimated Creatinine Clearance: 62.9 mL/min (by C-G formula based on SCr of 0.98 mg/dL).   Medical History: Past Medical History:  Diagnosis Date  . Acute diastolic heart failure ([place])   . Allergy   . 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 Asthma   . Atrial fibrillation ([place])   . Backache, unspecified   . Cancer ([place])    skin  . Cardiomegaly   . COPD (chronic obstructive pulmonary disease) ([place])   . Diabetes mellitus without complication ([place])   . 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 Esophageal reflux   . Headache(784.0)   . Herpes zoster without mention of complication   . Hx: UTI (urinary tract infection)   . Hypertension    heart controlled w CHF  . Nontoxic uninodular goiter   . Obesity, unspecified   . Osteoarthrosis, unspecified whether generalized or localized, unspecified site   . Unspecified sleep apnea   .  Urine incontinence    hx of    Assessment: 74 year old female with afib on warfarin PTA.  presents with GI bleeding in the setting of supratherapeutic INR, AKI, UTI. 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  Patient also with mechanical mitral valve replacement with goal INR 2.5-3.5. Home regimen 7 mg Mon, Tues, Thurs, Fri and 8 mg Wed, Sat, Sun. Warfarin has been on hold for endoscopic procedure. Pharmacy now consulted to restart warfarin. Patient continues on heparin drip.  Date INR Dose 6/16 4.6 -- 6/17 3.3 -- 6/18 2.7 -- 6/19 1.8 -- 6/20 1.4 8 mg 6/21 1.6 8 mg 6/22 2.3 2 mg 6/23 2.1       5 mg 6/24     2.0    Goal of Therapy:  Heparin level 0.3-0.7 units/ml  INR 2.5-3.5 Monitor platelets by anticoagulation protocol: Yes   Plan:  Heparin :6/24 1526 HL=0.63, therapeutic x 1 after rate increase. Will continue heparin drip at 1200 units/hr. Recheck HL in 8 hours and CBC with morning labs.  Warfarin: INR=2.0 still subtherapeutic. Will  order warfarin 6 mg tonight. INR with morning labs.  Paulina Fusi, PharmD, BCPS 06/15/2020 4:10 PM

## 2020-06-15 NOTE — TOC Progression Note (Signed)
Transition of Care [facility]) - Progression Note    Patient Details  Name: Betty [name] MRN: [id number] Date of Birth: 1946/04/20  Transition of [facility]) CM/SW [place], RN Phone Number: 06/15/2020, 3:50 PM  Clinical Narrative:    Met with the patient to discuss Bed offers and provided the Medicare.gov ratings for each, She asked to be able to review with her son this evening and she will let me know first thing in the morning what her choice is, Left the Medicare rating sheet for her to keep        Expected Discharge Plan and Services                                                 Social Determinants of Health (SDOH) Interventions    Readmission Risk Interventions No flowsheet data found.

## 2020-06-15 NOTE — Care Management Important Message (Signed)
Important Message  Patient Details  Name: Betty [name] MRN: [id number] Date of Birth: June 01, 1946   Medicare Important Message Given:  Yes     Taggert Bozzi Jen Mow, RN 06/15/2020, 10:52 AM

## 2020-06-15 NOTE — Evaluation (Signed)
Occupational Therapy Evaluation Patient Details Name: Betty [name] MRN: [id number] DOB: 12-02-1946 Today's Date: 06/15/2020    History of Present Illness Pt is a 74 y/o F with PMH: HFpEF (55%), interstitial lung disease, pAFib, history of recurrent major depression disorder, history of diabetes, ANCA associated vasculitis, history of PE, cardiac resynchronization therapy device in place, pulmonary hypertension, CKD4, history of sleep apnea on BiPAP and history of mechanical mitral valve replacements on chronic AC. Pt presented to ED with 3 day h/o dark/tarry stools. W/u included noting Hgb of 6.1 and hypotension. Pt adm for GIB.   Clinical Impression   Pt was seen for OT evaluation this date. Prior to hospital admission, pt was Indep to MOD I with BADLs, had a PCA 3x/wk for higher level IADLs, and had a housekeeper Qother week for cleaning. Pt lives alone in [facility] with 2STE with bilateral railing. Son checks on her regularly. Currently pt demonstrates impairments as described below (See OT problem list) which functionally limit her ability to perform ADL/self-care tasks. Pt currently requires MIN A with ADL transfers with RW and MIN A with LB ADLs, in addition she has significantly decreased endurance with desaturation to 81% when standing ~3 mins to complete 1 grooming task, requires ~30 second seated rest break with PLB on 3Lnc ot increase to >90%. She states she drops some at baseline with exertion, but monitors and it's usually around 86-87% at the lowest.  Pt would benefit from skilled OT to address noted impairments and functional limitations (see below for any additional details) in order to maximize safety and independence. As pt is experiencing increasd difficulty with ADL transfers and low endurance impacting performance of self care, upon hospital discharge, recommend STR.     Follow Up Recommendations  SNF    Equipment Recommendations  3 in 1 bedside commode    Recommendations for Other  Services       Precautions / Restrictions Precautions Precautions: Fall Precaution Comments: O2 desat/ SOB Restrictions Weight Bearing Restrictions: No      Mobility Bed Mobility               General bed mobility comments: pt up to chair pre/post session  Transfers Overall transfer level: Needs assistance Equipment used: Rolling walker (2 wheeled) Transfers: Sit to/from Stand Sit to Stand: Min assist              Balance Overall balance assessment: Needs assistance Sitting-balance support: Feet supported;No upper extremity supported Sitting balance-Leahy Scale: Good     Standing balance support: Bilateral upper extremity supported;During functional activity Standing balance-Leahy Scale: Fair Standing balance comment: B UE support on RW                           ADL either performed or assessed with clinical judgement   ADL Overall ADL's : Needs assistance/impaired Eating/Feeding: Independent   Grooming: Wash/dry hands;Wash/dry face;Oral care;Min guard;Standing           Upper Body Dressing : Set up;Sitting   Lower Body Dressing: Minimal assistance;Sit to/from stand   Toilet Transfer: Minimal assistance;Ambulation;RW Toilet Transfer Details (indicate cue type and reason): sats drop with fxl mobility to restroom (81% on 3Lnc, requires seated rest break x30 secs with PLB and increases to 93%)         Functional mobility during ADLs: Min guard;Rolling walker       Vision Baseline Vision/History: Wears glasses Wears Glasses: At all times Patient Visual Report:  No change from baseline       Perception     Praxis      Pertinent Vitals/Pain Pain Assessment: 0-10 Pain Score: 3  Pain Location: low back with extended standing Pain Descriptors / Indicators: Aching Pain Intervention(s): Limited activity within patient's tolerance;Monitored during session     Hand Dominance     Extremity/Trunk Assessment Upper Extremity  Assessment Upper Extremity Assessment: RUE deficits/detail;LUE deficits/detail RUE Deficits / Details: ROM WFL, strength grossly 4-/5 LUE Deficits / Details: shld ROM to 3/4 range. Elbow and grip MMT 4-/5   Lower Extremity Assessment Lower Extremity Assessment: Defer to PT evaluation       Communication Communication Communication: No difficulties   Cognition Arousal/Alertness: Awake/alert Behavior During Therapy: WFL for tasks assessed/performed Overall Cognitive Status: Within Functional Limits for tasks assessed                                 General Comments: Pt oriented to self, situation, location and most aspects of time (Thinks it is already July versus end of June, was off by 1 week). Clear conversationally and with good cue following.   General Comments       Exercises Other Exercises Other Exercises: OT facilitates education re: seated UB therex that pt can perform to increase strength outside of therapy time. Pt requires MIN cues for form and pace. Would benefit from handout with pictures and follow up.   Shoulder Instructions      Home Living Family/patient expects to be discharged to:: Private residence Living Arrangements: Alone Available Help at Discharge: Family;Available PRN/intermittently;Personal care attendant (states her son checks on her regularly and she has a PCA on MWF from 10-2) Type of Home: House Home Access: Stairs to enter [employer] of Steps: 2 Entrance Stairs-Rails: Can reach both Home Layout: One level     Bathroom Shower/Tub: Occupational psychologist: Handicapped height Bathroom Accessibility: Yes   Home Equipment: Environmental consultant - 4 wheels;Shower seat - built in;Grab bars - tub/shower          Prior Functioning/Environment Level of Independence: Needs assistance  Gait / Transfers Assistance Needed: Intermittent use of rollator for fxl mobility. ADL's / Homemaking Assistance Needed: Has housekeeper  Qother week to clean, has PCA on MWF to assist with laundry. Pt able to perform bathing, dressing, and toielting I'ly at baseline.   Comments: Pt reports that her son has taken her car and keys away x~22mos for pt safety stating "he feels that I'm too weak to drive right now".        OT Problem List: Decreased strength;Decreased range of motion;Decreased activity tolerance;Impaired balance (sitting and/or standing);Decreased knowledge of use of DME or AE;Cardiopulmonary status limiting activity      OT Treatment/Interventions: Self-care/ADL training;Therapeutic exercise;Energy conservation;DME and/or AE instruction;Therapeutic activities;Balance training;Patient/family education    OT Goals(Current goals can be found in the care plan section) Acute Rehab OT Goals Patient Stated Goal: go home OT Goal Formulation: With patient Time For Goal Achievement: 06/29/20 Potential to Achieve Goals: Good  OT Frequency: Min 2X/week   Barriers to D/C:            Co-evaluation              AM-PAC OT "6 Clicks" Daily Activity     Outcome Measure Help from another person eating meals?: A Little Help from another person taking care of personal grooming?: A Little Help  from another person toileting, which includes using toliet, bedpan, or urinal?: A Little Help from another person bathing (including washing, rinsing, drying)?: A Little Help from another person to put on and taking off regular upper body clothing?: A Little Help from another person to put on and taking off regular lower body clothing?: A Little 6 Click Score: 18   End of Session Equipment Utilized During Treatment: Gait belt;Rolling walker Nurse Communication: Mobility status  Activity Tolerance: Patient tolerated treatment well;Other (comment) (somewhat limited by oxygenation, decreased tolerance/endurance) Patient left: in chair;with call bell/phone within reach;with chair alarm set  OT Visit Diagnosis: Unsteadiness on  feet (R26.81);Muscle weakness (generalized) (M62.81)                Time: 1601-0932 OT Time Calculation (min): 59 min Charges:  OT General Charges $OT Visit: 1 Visit OT Evaluation $OT Eval Moderate Complexity: 1 Mod OT Treatments $Self Care/Home Management : 23-37 mins $Therapeutic Activity: 8-22 mins  Gerrianne Scale, MS, OTR/L ascom [phone] 06/15/20, 12:23 PM

## 2020-06-15 NOTE — Progress Notes (Signed)
[facility] for heparin + warfarin Indication: afib/mechanical mitral valve   Allergies  Allergen Reactions  . Flecainide Shortness Of Breath and Other (See Comments)    Reaction: dizziness   . Amiodarone Other (See Comments)    Pt states that this medication causes lung bleeding.      Patient Measurements: Height: 5\' 9"  (175.3 cm) Weight: 95.5 kg (210 lb 8.6 oz) IBW/kg (Calculated) : 66.2 Heparin Dosing Weight: 84.9 kg  Vital Signs: Temp: 98 F (36.7 C) (06/24 0010) BP: 130/68 (06/24 0010) Pulse Rate: 69 (06/24 0010)  Labs: Recent Labs    06/13/20 0422 06/13/20 0422 06/13/20 0856 06/14/20 0629 06/15/20 0511  HGB 7.7*   < >  --  9.1* 9.4*  HCT 25.3*  --   --  28.7* 30.7*  PLT 174  --   --  211 221  LABPROT 24.5*  --   --  22.4* 21.7*  INR 2.3*  --   --  2.1* 2.0*  HEPARINUNFRC 0.37  --   --  0.33 0.25*  CREATININE  --   --  0.98  --   --    < > = values in this interval not displayed.    Estimated Creatinine Clearance: 62.9 mL/min (by C-G formula based on SCr of 0.98 mg/dL).   Medical History: Past Medical History:  Diagnosis Date  . Acute diastolic heart failure ([place])   . Allergy   . ANCA-associated vasculitis ([place])   . Asthma   . Atrial fibrillation ([place])   . Backache, unspecified   . Cancer ([place])    skin  . Cardiomegaly   . COPD (chronic obstructive pulmonary disease) ([place])   . Diabetes mellitus without complication ([place])   . 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 Esophageal reflux   . Headache(784.0)   . Herpes zoster without mention of complication   . Hx: UTI (urinary tract infection)   . Hypertension    heart controlled w CHF  . Nontoxic uninodular goiter   . Obesity, unspecified   . Osteoarthrosis, unspecified whether generalized or localized, unspecified site   . Unspecified sleep apnea   . Urine incontinence    hx of    Assessment: 74 year old female with afib on  warfarin PTA.  presents with GI bleeding in the setting of supratherapeutic INR, AKI, UTI. 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  Patient also with mechanical mitral valve replacement with goal INR 2.5-3.5. Home regimen 7 mg Mon, Tues, Thurs, Fri and 8 mg Wed, Sat, Sun. Warfarin has been on hold for endoscopic procedure. Pharmacy now consulted to restart warfarin. Patient continues on heparin drip.  Date INR Dose 6/16 4.6 -- 6/17 3.3 -- 6/18 2.7 -- 6/19 1.8 -- 6/20 1.4 8 mg 6/21 1.6 8 mg 6/22 2.3 2 mg 6/23 2.1    Goal of Therapy:  Heparin level 0.3-0.7 units/ml  INR 2.5-3.5 Monitor platelets by anticoagulation protocol: Yes   Plan:  Heparin :6/23 0629 HL=0.33, remains therapeutic. Will continue heparin drip at 1050 units/hr. HL and CBC with morning labs.  Warfarin: INR=2.1 subtherapeutic. Will order warfarin 5 mg tonight. INR with morning labs.  6/24:  HL @ 0511 = 0.25 Will order heparin 1300 units IV X 1 bolus and increase drip rate to 1200 units/hr.  Will recheck HL 8 hrs after rate change.   Alga Southall D, PharmD 06/15/2020,6:49 AM

## 2020-06-15 NOTE — Progress Notes (Signed)
PROGRESS NOTE    Betty [name]  POE:[id number] DOB: 17-May-1946 DOA: 06/07/2020 PCP: Dion Body, MD    Brief Narrative:  74 year old female with history of diastolic dysfunction, ejection fraction 55%, interstitial lung disease, paroxysmal atrial fibrillation, history of major depression, diabetes on insulin, vasculitis, PE, history of mitral valve repair on chronic anticoagulation therapy with Coumadin, pacemaker in place, stage IV chronic kidney disease and history of sleep apnea on BiPAP was admitted to the intensive care unit on 06/07/2020 with dark stools at home.  She was found with blood pressure 80/49, INR 4.6 hemoglobin 6.1 in the ER.  Urine consistent with UTI.  COVID-19 negative. COVID-19 negative Blood cultures 6/16, negative Urine culture 6/16, Klebsiella sensitive to ceftriaxone. 6/20, EGD showing multiple bleeding angiectasia in the stomach.  Developed some delirium postoperative on 6/28 that is improved.  Assessment & Plan:   Active Problems:   Afib (HCC)   Shock ([place])  Symptomatic anemia, acute GI bleeding in the setting of supratherapeutic INR: Received total 2 units of PRBC transfusion, hemoglobin is stable since then.  We will continue close monitoring. Underwent EGD on 6/20, found to have telangiectasia in the stomach treated with APC.  Followed by GI.  Currently on oral Protonix.  Altered mental status: Likely postop delirium.  Improved and normalized.  Hypotension in the setting of hemorrhagic shock: Improved.  Torsemide, beta-blockers and aspirin, Aldactone on hold.  Acute UTI present on admission: Klebsiella pneumonia.  Treated with Rocephin for 7 days.  Finished antibiotic therapy.  History of COPD and asthma without acute exacerbation, obstructive sleep apnea: On oxygen supplementation at home, continued.  Keep on oxygen to keep sats more than 88%.  As needed bronchodilators. BiPAP at night at home settings.  Tolerating well.  Type 2 diabetes: Fairly  controlled on current dose of insulin.  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  Goal 2.5-3.5.  No active bleeding.  Continue to redose Coumadin.  Heparin to be continued until INR is more than 2.5.  Acute kidney injury: Due to #1.  Improved adequately.  Normal renal functions.  Physical deconditioning: Work with PT OT.  Recommend skilled nursing facility placement before going home to independent living.   DVT prophylaxis: SCDs Start: 06/07/20 1406 warfarin (COUMADIN) tablet 6 mg   Code Status: Full code Family Communication: Called patient's son, unable to talk. Disposition Plan: Status is: Inpatient  Remains inpatient appropriate because:Inpatient level of care appropriate due to severity of illness   Dispo: The patient is from: Home              Anticipated d/c is to: Skilled nursing facility.              Anticipated d/c date is: 1 day              Patient currently is not medically stable to d/c.  Unsafe to go home.   Consultants:   Cardiology  Critical care  GI  Procedures:   EGD 6/20  Antimicrobials:  Antibiotics Given (last 72 hours)    Date/Time Action Medication Dose Rate   06/12/20 2117 New Bag/Given   cefTRIAXone (ROCEPHIN) 1 g in sodium chloride 0.9 % 100 mL IVPB 1 g 200 mL/hr   06/13/20 2117 New Bag/Given   cefTRIAXone (ROCEPHIN) 1 g in sodium chloride 0.9 % 100 mL IVPB 1 g 200 mL/hr         Subjective: Patient seen and examined.  No overnight events.  She was happy to  have bowel movements.  Denies any chest pain shortness of breath or palpitations.  She is agreeable to go to rehab. She has neuropathic pain and she was wondering whether she can have some medicine for this.  We will start patient on low-dose gabapentin.  Objective: Vitals:   06/14/20 0753 06/14/20 1430 06/14/20 1713 06/15/20 0010  BP: 121/73 117/78 118/61 130/68  Pulse: 74 75 70 69  Resp: 17 18 18 17   Temp: 97.9 F (36.6 C) 97.9 F (36.6 C) 97.9 F (36.6 C) 98 F  (36.7 C)  TempSrc: Oral Oral Oral   SpO2: 100%  100% 100%  Weight:      Height:        Intake/Output Summary (Last 24 hours) at 06/15/2020 1300 Last data filed at 06/15/2020 1048 Gross per 24 hour  Intake --  Output 1 ml  Net -1 ml   Filed Weights   06/11/20 0818 06/12/20 0500 06/13/20 0500  Weight: 91.8 kg 91.6 kg 95.5 kg    Examination:  General exam: Appears calm and comfortable, chronically sick looking.  Not in any distress. Patient is on 2 L of nasal cannula oxygen. She is alert oriented x4. Respiratory system: Clear to auscultation. Respiratory effort normal. Cardiovascular system: S1 & S2 heard, mechanical heart murmur present.   Gastrointestinal system: Abdomen is nondistended, soft and nontender. No organomegaly or masses felt. Normal bowel sounds heard. Central nervous system: Alert and oriented. No focal neurological deficits.  Data Reviewed: I have personally reviewed following labs and imaging studies  CBC: Recent Labs  Lab 06/11/20 0404 06/12/20 0559 06/13/20 0422 06/14/20 0629 06/15/20 0511  WBC 8.5 5.8 7.1 8.4 7.4  HGB 7.6* 8.0* 7.7* 9.1* 9.4*  HCT 23.5* 26.4* 25.3* 28.7* 30.7*  MCV 93.3 97.4 96.9 92.9 93.6  PLT 180 172 174 211 778   Basic Metabolic Panel: Recent Labs  Lab 06/09/20 0105 06/09/20 0105 06/10/20 0453 06/11/20 0404 06/11/20 1436 06/12/20 0559 06/13/20 0856  NA 137   < > 140 139 141 144 141  K 3.1*   < > 3.3* 4.6 4.6 4.3 3.9  CL 90*   < > 99 103 105 106 105  CO2 34*   < > 34* 33* 29 30 28   GLUCOSE 130*   < > 166* 212* 224* 147* 140*  BUN 113*   < > 75* 37* 28* 30* 28*  CREATININE 1.76*   < > 1.19* 0.91 0.88 1.00 0.98  CALCIUM 8.1*   < > 8.1* 8.0* 8.3* 8.3* 8.4*  MG 3.1*  --  2.5* 2.2  --  2.3 1.9  PHOS  --   --  2.5 2.2* 2.4* 3.1 3.0   < > = values in this interval not displayed.   GFR: Estimated Creatinine Clearance: 62.9 mL/min (by C-G formula based on SCr of 0.98 mg/dL). Liver Function Tests: Recent Labs  Lab  06/10/20 0453 06/11/20 1436  ALBUMIN 3.1* 3.3*   No results for input(s): LIPASE, AMYLASE in the last 168 hours. Recent Labs  Lab 06/11/20 1436  AMMONIA 20   Coagulation Profile: Recent Labs  Lab 06/11/20 0404 06/12/20 0559 06/13/20 0422 06/14/20 0629 06/15/20 0511  INR 1.4* 1.6* 2.3* 2.1* 2.0*   Cardiac Enzymes: No results for input(s): CKTOTAL, CKMB, CKMBINDEX, TROPONINI in the last 168 hours. BNP (last 3 results) No results for input(s): PROBNP in the last 8760 hours. HbA1C: No results for input(s): HGBA1C in the last 72 hours. CBG: Recent Labs  Lab 06/14/20 1221 06/14/20  1711 06/14/20 2109 06/15/20 0757 06/15/20 1211  GLUCAP 166* 155* 198* 97 189*   Lipid Profile: No results for input(s): CHOL, HDL, LDLCALC, TRIG, CHOLHDL, LDLDIRECT in the last 72 hours. Thyroid Function Tests: No results for input(s): TSH, T4TOTAL, FREET4, T3FREE, THYROIDAB in the last 72 hours. Anemia Panel: No results for input(s): VITAMINB12, FOLATE, FERRITIN, TIBC, IRON, RETICCTPCT in the last 72 hours. Sepsis Labs: Recent Labs  Lab 06/09/20 0105  PROCALCITON <0.10    Recent Results (from the past 240 hour(s))  SARS Coronavirus 2 by RT PCR (hospital order, performed in [facility] hospital lab) Nasopharyngeal Nasopharyngeal Swab     Status: None   Collection Time: 06/07/20  1:34 PM   Specimen: Nasopharyngeal Swab  Result Value Ref Range Status   SARS Coronavirus 2 NEGATIVE NEGATIVE Final    Comment: (NOTE) SARS-CoV-2 target nucleic acids are NOT DETECTED.  The SARS-CoV-2 RNA is generally detectable in upper and lower respiratory specimens during the acute phase of infection. The lowest concentration of SARS-CoV-2 viral copies this assay can detect is 250 copies / mL. A negative result does not preclude SARS-CoV-2 infection and should not be used as the sole basis for treatment or other patient management decisions.  A negative result may occur with improper specimen collection  / handling, submission of specimen other than nasopharyngeal swab, presence of viral mutation(s) within the areas targeted by this assay, and inadequate number of viral copies (<250 copies / mL). A negative result must be combined with clinical observations, patient history, and epidemiological information.  Fact Sheet for Patients:   StrictlyIdeas.no  Fact Sheet for Healthcare Providers: BankingDealers.co.za  This test is not yet approved or  cleared by the [place] FDA and has been authorized for detection and/or diagnosis of SARS-CoV-2 by FDA under an Emergency Use Authorization (EUA).  This EUA will remain in effect (meaning this test can be used) for the duration of the COVID-19 declaration under Section 564(b)(1) of the Act, 21 U.S.C. section 360bbb-3(b)(1), unless the authorization is terminated or revoked sooner.  Performed at [facility], [address] [place], [place] [address]   Blood Culture (routine x 2)     Status: None   Collection Time: 06/07/20  5:24 PM   Specimen: BLOOD  Result Value Ref Range Status   Specimen Description BLOOD BLOOD RIGHT HAND  Final   Special Requests   Final    BOTTLES DRAWN AEROBIC AND ANAEROBIC Blood Culture adequate volume   Culture   Final    NO GROWTH 5 DAYS Performed at [facility], [address], [place], [place] [address]    Report Status 06/12/2020 FINAL  Final  Urine culture     Status: Abnormal   Collection Time: 06/07/20  5:24 PM   Specimen: Urine, Clean Catch  Result Value Ref Range Status   Specimen Description   Final    URINE, CLEAN CATCH Performed at [facility], [address], [place], [place] [address]    Special Requests   Final    NONE Performed at [facility], [address], [place], [place] [address]    Culture >=100,000 COLONIES/mL KLEBSIELLA PNEUMONIAE (A)  Final   Report Status 06/09/2020 FINAL  Final    Organism ID, Bacteria KLEBSIELLA PNEUMONIAE (A)  Final      Susceptibility   Klebsiella pneumoniae - MIC*    AMPICILLIN RESISTANT Resistant     CEFAZOLIN <=4 SENSITIVE Sensitive     CEFTRIAXONE <=0.25 SENSITIVE Sensitive  CIPROFLOXACIN <=0.25 SENSITIVE Sensitive     GENTAMICIN <=1 SENSITIVE Sensitive     IMIPENEM <=0.25 SENSITIVE Sensitive     NITROFURANTOIN 64 INTERMEDIATE Intermediate     TRIMETH/SULFA <=20 SENSITIVE Sensitive     AMPICILLIN/SULBACTAM 4 SENSITIVE Sensitive     PIP/TAZO <=4 SENSITIVE Sensitive     * >=100,000 COLONIES/mL KLEBSIELLA PNEUMONIAE  MRSA PCR Screening     Status: None   Collection Time: 06/07/20  6:25 PM   Specimen: Nasopharyngeal  Result Value Ref Range Status   MRSA by PCR NEGATIVE NEGATIVE Final    Comment:        The GeneXpert MRSA Assay (FDA approved for NASAL specimens only), is one component of a comprehensive MRSA colonization surveillance program. It is not intended to diagnose MRSA infection nor to guide or monitor treatment for MRSA infections. Performed at [facility], [address], [place], [place] [address]          Radiology Studies: No results found.      Scheduled Meds: . sodium chloride   Intravenous Once  . sodium chloride   Intravenous Once  . sodium chloride   Intravenous Once  . sodium chloride   Intravenous Once  . azaTHIOprine  100 mg Oral Daily  . Chlorhexidine Gluconate Cloth  6 each Topical Daily  . gabapentin  100 mg Oral QHS  . insulin aspart  0-5 Units Subcutaneous QHS  . insulin aspart  0-9 Units Subcutaneous TID WC  . mouth rinse  15 mL Mouth Rinse BID  . melatonin  5 mg Oral QHS  . pantoprazole  40 mg Oral Daily  . phosphorus  500 mg Oral Once  . polyethylene glycol  17 g Oral Daily  . sodium chloride flush  10-40 mL Intracatheter Q12H  . sodium chloride flush  3 mL Intravenous Q12H  . warfarin  6 mg Oral ONCE-1600  . Warfarin - Pharmacist Dosing Inpatient   Does not apply q1600    Continuous Infusions: . sodium chloride    . sodium chloride Stopped (06/13/20 1629)  . heparin 1,200 Units/hr (06/15/20 0650)     LOS: 8 days    Time spent: 25 minutes    Barb Merino, MD [facility] Pager [phone]

## 2020-06-15 NOTE — TOC Progression Note (Addendum)
Transition of Care [facility]) - Progression Note    Patient Details  Name: Betty [name] MRN: [id number] Date of Birth: 12-Jun-1946  Transition of Care [facility]) CM/SW Contact  Su Hilt, RN Phone Number: 06/15/2020, 10:49 AM  Clinical Narrative:     Met with the patient to discuss Bed options, she requested that I look in the [place] area, she does not like the choices in [facility], I sent out the bed search to the [facility], will review the choices once obtained, The patient has had the Covid vaccines       Expected Discharge Plan and Services                                                 Social Determinants of Health (SDOH) Interventions    Readmission Risk Interventions No flowsheet data found.

## 2020-06-16 LAB — PROTIME-INR
INR: 2 — ABNORMAL HIGH (ref 0.8–1.2)
Prothrombin Time: 21.8 seconds — ABNORMAL HIGH (ref 11.4–15.2)

## 2020-06-16 LAB — GLUCOSE, CAPILLARY
Glucose-Capillary: 88 mg/dL (ref 70–99)
Glucose-Capillary: 153 mg/dL — ABNORMAL HIGH (ref 70–99)
Glucose-Capillary: 181 mg/dL — ABNORMAL HIGH (ref 70–99)
Glucose-Capillary: 121 mg/dL — ABNORMAL HIGH (ref 70–99)

## 2020-06-16 LAB — HEPARIN LEVEL (UNFRACTIONATED): Heparin Unfractionated: 0.46 IU/mL (ref 0.30–0.70)

## 2020-06-16 MED ORDER — TRAZODONE HCL 50 MG PO TABS
50.0000 mg | ORAL_TABLET | Freq: Every day | ORAL | Status: DC
  Administered 2020-06-16 – 2020-06-18 (×3): 50 mg via ORAL
  Filled 2020-06-16 (×3): qty 1

## 2020-06-16 MED ORDER — HYDROCORTISONE 1 % EX CREA
TOPICAL_CREAM | Freq: Two times a day (BID) | CUTANEOUS | Status: DC | PRN
  Administered 2020-06-17: 1 via TOPICAL
  Filled 2020-06-16 (×2): qty 28

## 2020-06-16 MED ORDER — DIPHENHYDRAMINE-ZINC ACETATE 2-0.1 % EX CREA
TOPICAL_CREAM | Freq: Two times a day (BID) | CUTANEOUS | Status: DC | PRN
  Filled 2020-06-16: qty 28

## 2020-06-16 MED ORDER — METOPROLOL SUCCINATE ER 25 MG PO TB24
25.0000 mg | ORAL_TABLET | Freq: Every day | ORAL | Status: DC
  Administered 2020-06-17 – 2020-06-19 (×3): 25 mg via ORAL
  Filled 2020-06-16 (×3): qty 1

## 2020-06-16 MED ORDER — CYCLOBENZAPRINE HCL 10 MG PO TABS: 10.0000 mg | ORAL_TABLET | Freq: Three times a day (TID) | ORAL | Status: DC | PRN

## 2020-06-16 MED ORDER — PREGABALIN 75 MG PO CAPS
75.0000 mg | ORAL_CAPSULE | Freq: Every day | ORAL | Status: DC
  Administered 2020-06-16 – 2020-06-19 (×4): 75 mg via ORAL
  Filled 2020-06-16 (×4): qty 1

## 2020-06-16 MED ORDER — VENLAFAXINE HCL ER 37.5 MG PO CP24
37.5000 mg | ORAL_CAPSULE | Freq: Every day | ORAL | Status: DC
  Administered 2020-06-16 – 2020-06-19 (×4): 37.5 mg via ORAL
  Filled 2020-06-16 (×4): qty 1

## 2020-06-16 MED ORDER — VENLAFAXINE HCL ER 150 MG PO CP24
150.0000 mg | ORAL_CAPSULE | Freq: Every day | ORAL | Status: DC
  Administered 2020-06-16 – 2020-06-19 (×4): 150 mg via ORAL
  Filled 2020-06-16 (×5): qty 1

## 2020-06-16 MED ORDER — WARFARIN SODIUM 7.5 MG PO TABS
7.5000 mg | ORAL_TABLET | Freq: Once | ORAL | Status: AC
  Administered 2020-06-16: 7.5 mg via ORAL
  Filled 2020-06-16: qty 1

## 2020-06-16 MED ORDER — ATORVASTATIN CALCIUM 20 MG PO TABS
20.0000 mg | ORAL_TABLET | Freq: Every day | ORAL | Status: DC
  Administered 2020-06-16 – 2020-06-18 (×3): 20 mg via ORAL
  Filled 2020-06-16 (×3): qty 1

## 2020-06-16 MED ORDER — ROPINIROLE HCL 1 MG PO TABS
3.0000 mg | ORAL_TABLET | Freq: Three times a day (TID) | ORAL | Status: DC
  Administered 2020-06-16 – 2020-06-19 (×9): 3 mg via ORAL
  Filled 2020-06-16 (×9): qty 3

## 2020-06-16 NOTE — TOC Progression Note (Signed)
Transition of Care [facility]) - Progression Note    Patient Details  Name: MCKYNNA VANLOAN MRN: [id number] Date of Birth: Jan 14, 1946  Transition [facility]) CM/SW Contact  Su Hilt, RN Phone Number: 06/16/2020, 12:08 PM  Clinical Narrative:    Met with the patient to get bed choice She does not want to go to any of the [place] facilities and asked that I see if [facility] in [place] has a bed, I faxed the referral and bed search to [place], Jenelle Mages at [facility] at [phone] and left a secure Voicemail asking for him to review the bed request, Awaiting a call abck        Expected Discharge Plan and Services                                                 Social Determinants of Health (SDOH) Interventions    Readmission Risk Interventions No flowsheet data found.

## 2020-06-16 NOTE — Progress Notes (Signed)
Physical Therapy Treatment Patient Details Name: Betty [name] MRN: [id number] DOB: Jul 14, 1946 Today's Date: 06/16/2020    History of Present Illness Pt is a 74 y/o F with PMH: HFpEF (55%), interstitial lung disease, pAFib, history of recurrent major depression disorder, history of diabetes, ANCA associated vasculitis, history of PE, cardiac resynchronization therapy device in place, pulmonary hypertension, CKD4, history of sleep apnea on BiPAP and history of mechanical mitral valve replacements on chronic AC. Pt presented to ED with 3 day h/o dark/tarry stools. W/u included noting Hgb of 6.1 and hypotension. Pt adm for GIB.    PT Comments    Pt in chair upon entry, appears to be falling in/out of sleep, awakens to author's greeting and is agreeable to participate despite sleepiness. 3.5L O2 donned, 99% at beginning of session, desat with first exercises, then unable to obtain signal thereafter. Worked on leg strength and balance in session, adequate recovery intervals provided. Legs fatigue significantly after 4 reps of transfers. Pt continues to progress toward goals.    Follow Up Recommendations  SNF     Equipment Recommendations  Rolling walker with 5" wheels    Recommendations for Other Services       Precautions / Restrictions Precautions Precautions: Fall Precaution Comments: O2 desat/ SOB Restrictions Weight Bearing Restrictions: No    Mobility  Bed Mobility               General bed mobility comments: pt up to chair pre/post session  Transfers Overall transfer level: Modified independent Equipment used: None Transfers: Sit to/from Stand           General transfer comment: several times from recliner, use of chair arms ad lib, able to stand unsupported for a while without LOB  Ambulation/Gait Ambulation/Gait assistance:  (deferred this date, due to difficulty with O2 signal)               Stairs             Wheelchair Mobility     Modified Rankin (Stroke Patients Only)       Balance           Standing balance support: No upper extremity supported Standing balance-Leahy Scale: Good Standing balance comment: able to perform unsupported vertical and horizontal head turns without increaed sway or LOB                            Cognition Arousal/Alertness: Awake/alert Behavior During Therapy: WFL for tasks assessed/performed Overall Cognitive Status: Within Functional Limits for tasks assessed                                        Exercises Other Exercises Other Exercises: Seated LAQ 1x15 bilat Other Exercises: Seated CKC ankle PF heel raises 1x20 bilat Other Exercises: 2x5 STS from recliner+pillow, on 3.5L/min, then 5L (HR increases to 105-113bpm in AF c RVR) Other Exercises: Normal stance firm surface horizontal head turns unsupported 10x bilat, alternating, cued compensational Rt trunk rotation to make up for ROM deficits in Rt cervical rotation. Other Exercises: Normal stance firm surface vertical head turns unsupported 10x    General Comments        Pertinent Vitals/Pain Pain Assessment: Faces Faces Pain Scale: Hurts little more Pain Location: bilat feet (has new pedal edema) Pain Intervention(s): Limited activity within patient's tolerance;Monitored during session  Home Living                      Prior Function            PT Goals (current goals can now be found in the care plan section) Acute Rehab PT Goals Patient Stated Goal: go home PT Goal Formulation: Patient unable to participate in goal setting Time For Goal Achievement: 06/24/20 Potential to Achieve Goals: Fair Progress towards PT goals: Progressing toward goals    Frequency    Min 2X/week      PT Plan Current plan remains appropriate    Co-evaluation              AM-PAC PT "6 Clicks" Mobility   Outcome Measure  Help needed turning from your back to your side while  in a flat bed without using bedrails?: A Little Help needed moving from lying on your back to sitting on the side of a flat bed without using bedrails?: A Little Help needed moving to and from a bed to a chair (including a wheelchair)?: A Little Help needed standing up from a chair using your arms (e.g., wheelchair or bedside chair)?: A Little Help needed to walk in hospital room?: A Lot Help needed climbing 3-5 steps with a railing? : A Lot 6 Click Score: 16    End of Session Equipment Utilized During Treatment: Gait belt Activity Tolerance: Patient tolerated treatment well;Patient limited by fatigue Patient left: in chair;with chair alarm set;with call bell/phone within reach;with nursing/sitter in room Nurse Communication: Mobility status PT Visit Diagnosis: Unsteadiness on feet (R26.81);Muscle weakness (generalized) (M62.81);Difficulty in walking, not elsewhere classified (R26.2)     Time: 6063-0160 PT Time Calculation (min) (ACUTE ONLY): 35 min  Charges:  $Therapeutic Exercise: 8-22 mins $Neuromuscular Re-education: 8-22 mins                     4:15 PM, 06/16/20 Etta Grandchild, PT, DPT Physical Therapist - [facility]  [phone] ([place])     Remmy Riffe C 06/16/2020, 4:12 PM

## 2020-06-16 NOTE — Progress Notes (Signed)
Occupational Therapy Treatment Patient Details Name: Betty [name] MRN: [id number] DOB: 04-Feb-1946 Today's Date: 06/16/2020    History of present illness Pt is a 74 y/o F with PMH: HFpEF (55%), interstitial lung disease, pAFib, history of recurrent major depression disorder, history of diabetes, ANCA associated vasculitis, history of PE, cardiac resynchronization therapy device in place, pulmonary hypertension, CKD4, history of sleep apnea on BiPAP and history of mechanical mitral valve replacements on chronic AC. Pt presented to ED with 3 day h/o dark/tarry stools. W/u included noting Hgb of 6.1 and hypotension. Pt adm for GIB.   OT comments  Pt seen for OT tx this date to f/u re: safety with self care ADLs/ADL mobility. Pt sitting up in chair when OT presents. OT facilitates sit to stand with RW with portable oxygen with Supv/SBA. Pt demos good control. Pt performs fxl mobility to sink to engages in grooming tasks. Pt fatigues after brushing teeth sink-side with setup for energy conservation (stands ~4 mins) and requires seated rest break. Pt's spO2 dropped from 94-96% sitting on 3Lnc to 83% with fxl mobility/standing activity. Pt able to recover to >90% after ~30seconds seated rest break and implementation of purse lipped breathing with cues from therapist. Pt takes 3 minute seated rest break and then needs to use restroom. OT facilitates fxl mobility with RW with Supv/SBA to [facility] ~71ft away. Pt able to stand to perform peri care x~45 seconds with setup/supv. Pt becomes fatigued again requiring seated rest break while washing hands with wash cloth and setup from therapist. Pt then able to perform fxl mobility back to chair with supv and use of RW and sits with good eccentric control and one cue to reach back for the arm rest. Pt left in chair with call bell in reach. Tray setup in front of pt with dinner. All needs met and in reach.   Of note: pt talks to this Chartered loss adjuster re: some misgivings about going to  rehab after hospital. Pt does feel she needs more help/rehab at this time, but feels close to being able to go home. D/t living alone and pt still with significantly limited fxl activity tolerance, do not anticipate she could safely prep meals, safely perform fxl mobility to/from restroom multiple times a day, and generally manage all aspects of self care/fxl mobility without assistance at this. OT implements therapeutic listening and offers this feedback which pt is very receptive to. SNF remains most appropriate d/c recommendation at this time, but will continue to follow.    Follow Up Recommendations  SNF    Equipment Recommendations  3 in 1 bedside commode    Recommendations for Other Services      Precautions / Restrictions Precautions Precautions: Fall Precaution Comments: O2 desat/ SOB Restrictions Weight Bearing Restrictions: No       Mobility Bed Mobility Overal bed mobility: Modified Independent             General bed mobility comments: pt up to chair pre/post session  Transfers Overall transfer level: Modified independent Equipment used: Rolling walker (2 wheeled) Transfers: Sit to/from Stand Sit to Stand: Supervision         General transfer comment: several times from recliner, use of chair arms ad lib, able to stand unsupported for a while without LOB    Balance Overall balance assessment: Needs assistance Sitting-balance support: Feet supported;No upper extremity supported Sitting balance-Leahy Scale: Good Sitting balance - Comments: no loss of balance while sitting EOB   Standing balance support: Single extremity  supported Standing balance-Leahy Scale: Good Standing balance comment: able to perform unsupported vertical and horizontal head turns without increaed sway or LOB                           ADL either performed or assessed with clinical judgement   ADL Overall ADL's : Needs assistance/impaired     Grooming: Wash/dry  hands;Wash/dry face;Oral care;Supervision/safety;Standing                   Toilet Transfer: Holiday representative Details (indicate cue type and reason): Pt performs fxl mobility from EOB with Supv with RW to [facility] ~43f away wtih spO2 drop to 83%, with seated rest break on commode, comes back up to >90% on 3Lnc in ~30 seconds. Toileting- Clothing Manipulation and Hygiene: Supervision/safety;Set up;Sit to/from stand       Functional mobility during ADLs: Supervision/safety;Rolling walker (Pt performs fxl mobility to sink with RW with supv, requires seated rest break after.)       Vision Baseline Vision/History: Wears glasses Wears Glasses: At all times Patient Visual Report: No change from baseline     Perception     Praxis      Cognition Arousal/Alertness: Awake/alert Behavior During Therapy: WFL for tasks assessed/performed Overall Cognitive Status: Within Functional Limits for tasks assessed                                          Exercises Other Exercises Other Exercises: therapeutic use of self and therapeutic listening implemented to comfort pt as she is having some concerns r/t needing rehab after hospital. OT facilitates education re: reasonable expectations for rehab and pt with good reception and calmer after. Other Exercises: Seated CKC ankle PF heel raises 1x20 bilat Other Exercises: 2x5 STS from recliner+pillow, on 3.5L/min, then 5L (HR increases to 105-113bpm in AF c RVR) Other Exercises: Normal stance firm surface horizontal head turns unsupported 10x bilat, alternating, cued compensational Rt trunk rotation to make up for ROM deficits in Rt cervical rotation. Other Exercises: Normal stance firm surface vertical head turns unsupported 10x   Shoulder Instructions       General Comments      Pertinent Vitals/ Pain       Pain Assessment: No/denies pain Faces Pain Scale: Hurts little more Pain Location:  bilat feet (has new pedal edema) Pain Intervention(s): Limited activity within patient's tolerance;Monitored during session  Home Living                                          Prior Functioning/Environment              Frequency  Min 2X/week        Progress Toward Goals  OT Goals(current goals can now be found in the care plan section)  Progress towards OT goals: Progressing toward goals  Acute Rehab OT Goals Patient Stated Goal: go home OT Goal Formulation: With patient Time For Goal Achievement: 06/29/20 Potential to Achieve Goals: Good  Plan Discharge plan remains appropriate    Co-evaluation                 AM-PAC OT "6 Clicks" Daily Activity     Outcome Measure   Help from another person eating  meals?: None Help from another person taking care of personal grooming?: A Little Help from another person toileting, which includes using toliet, bedpan, or urinal?: A Little Help from another person bathing (including washing, rinsing, drying)?: A Little Help from another person to put on and taking off regular upper body clothing?: None Help from another person to put on and taking off regular lower body clothing?: A Little 6 Click Score: 20    End of Session Equipment Utilized During Treatment: Gait belt;Rolling walker;Oxygen  OT Visit Diagnosis: Unsteadiness on feet (R26.81);Muscle weakness (generalized) (M62.81)   Activity Tolerance Patient tolerated treatment well   Patient Left in chair;with call bell/phone within reach;with chair alarm set   Nurse Communication Mobility status        Time: 0104-0459 OT Time Calculation (min): 57 min  Charges: OT General Charges $OT Visit: 1 Visit OT Treatments $Self Care/Home Management : 23-37 mins $Therapeutic Activity: 23-37 mins  Gerrianne Scale, MS, OTR/L ascom [phone] 06/16/20, 6:16 PM

## 2020-06-16 NOTE — TOC Progression Note (Signed)
Transition of Care [facility]) - Progression Note    Patient Details  Name: [name] MRN: [id number] Date of Birth: [birth year]  [facility] CM/SW Contact  Su Hilt, RN Phone Number: 06/16/2020, 4:06 PM  Clinical Narrative:     Met with the patient to review the additional bed offers, she accepted the bed offer from [facility], I notified Penny at [facility], I called [place] to begin the insur approval process and requested a start date for Monday, I faxed the clinical information to [phone], ref number [id number]       Expected Discharge Plan and Services                                                 Social Determinants of Health (SDOH) Interventions    Readmission Risk Interventions No flowsheet data found.

## 2020-06-16 NOTE — Progress Notes (Signed)
Time for heparin + warfarin Indication: afib/mechanical mitral valve   Allergies  Allergen Reactions  . Flecainide Shortness Of Breath and Other (See Comments)    Reaction: dizziness   . Amiodarone Other (See Comments)    Pt states that this medication causes lung bleeding.      Patient Measurements: Height: 5\' 9"  (175.3 cm) Weight: 95.5 kg (210 lb 8.6 oz) IBW/kg (Calculated) : 66.2 Heparin Dosing Weight: 84.9 kg  Vital Signs: Temp: 98.7 F (37.1 C) (06/24 2326) Temp Source: Oral (06/24 2326) BP: 108/84 (06/24 2326) Pulse Rate: 71 (06/24 2326)  Labs: Recent Labs    06/13/20 0856 06/14/20 0629 06/14/20 0629 06/15/20 0511 06/15/20 1526 06/16/20 0450  HGB  --  9.1*  --  9.4*  --   --   HCT  --  28.7*  --  30.7*  --   --   PLT  --  211  --  221  --   --   LABPROT  --  22.4*  --  21.7*  --  21.8*  INR  --  2.1*  --  2.0*  --  2.0*  HEPARINUNFRC  --  0.33   < > 0.25* 0.63 0.46  CREATININE 0.98  --   --   --   --   --    < > = values in this interval not displayed.    Estimated Creatinine Clearance: 62.9 mL/min (by C-G formula based on SCr of 0.98 mg/dL).   Medical History: Past Medical History:  Diagnosis Date  . Acute diastolic heart failure ([place])   . Allergy   . ANCA-associated vasculitis ([place])   . Asthma   . Atrial fibrillation ([place])   . Backache, unspecified   . Cancer ([place])    skin  . Cardiomegaly   . COPD (chronic obstructive pulmonary disease) ([place])   . Diabetes mellitus without complication ([place])   . 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 Esophageal reflux   . Headache(784.0)   . Herpes zoster without mention of complication   . Hx: UTI (urinary tract infection)   . Hypertension    heart controlled w CHF  . Nontoxic uninodular goiter   . Obesity, unspecified   . Osteoarthrosis, unspecified whether generalized or localized, unspecified site   . Unspecified sleep apnea   .  Urine incontinence    hx of    Assessment: 74 year old female with afib on warfarin PTA.  presents with GI bleeding in the setting of supratherapeutic INR, AKI, UTI. 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  Patient also with mechanical mitral valve replacement with goal INR 2.5-3.5. Home regimen 7 mg Mon, Tues, Thurs, Fri and 8 mg Wed, Sat, Sun. Warfarin has been on hold for endoscopic procedure. Pharmacy now consulted to restart warfarin. Patient continues on heparin drip.  Date INR Dose 6/16 4.6 -- 6/17 3.3 -- 6/18 2.7 -- 6/19 1.8 -- 6/20 1.4 8 mg 6/21 1.6 8 mg 6/22 2.3 2 mg 6/23 2.1       5 mg 6/24     2.0    Goal of Therapy:  Heparin level 0.3-0.7 units/ml  INR 2.5-3.5 Monitor platelets by anticoagulation protocol: Yes   Plan:  Heparin :6/25 0450 HL=0.46, therapeutic x 2. Will continue heparin drip at 1200 units/hr. Recheck HL and CBC with morning labs.  Warfarin: INR=2.0 still subtherapeutic. Will order  warfarin 6 mg tonight. INR with morning labs.  Hart Robinsons, PharmD Clinical Pharmacist

## 2020-06-16 NOTE — Progress Notes (Signed)
[place] for heparin + warfarin Indication: afib/mechanical mitral valve   Allergies  Allergen Reactions  . Flecainide Shortness Of Breath and Other (See Comments)    Reaction: dizziness   . Amiodarone Other (See Comments)    Pt states that this medication causes lung bleeding.      Patient Measurements: Height: 5\' 9"  (175.3 cm) Weight: 95.5 kg (210 lb 8.6 oz) IBW/kg (Calculated) : 66.2 Heparin Dosing Weight: 84.9 kg  Vital Signs: Temp: 97.6 F (36.4 C) (06/25 0810) Temp Source: Oral (06/25 0810) BP: 84/64 (06/25 0810) Pulse Rate: 81 (06/25 0810)  Labs: Recent Labs    06/14/20 0629 06/14/20 0629 06/15/20 0511 06/15/20 1526 06/16/20 0450  HGB 9.1*  --  9.4*  --   --   HCT 28.7*  --  30.7*  --   --   PLT 211  --  221  --   --   LABPROT 22.4*  --  21.7*  --  21.8*  INR 2.1*  --  2.0*  --  2.0*  HEPARINUNFRC 0.33   < > 0.25* 0.63 0.46   < > = values in this interval not displayed.    Estimated Creatinine Clearance: 62.9 mL/min (by C-G formula based on SCr of 0.98 mg/dL).   Medical History: Past Medical History:  Diagnosis Date  . Acute diastolic heart failure ([place])   . Allergy   . 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)   . Asthma   . Atrial fibrillation ([place])   . Backache, unspecified   . Cancer ([place])    skin  . Cardiomegaly   . COPD (chronic obstructive pulmonary disease) ([place])   . Diabetes mellitus without complication ([place])   . 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 Esophageal reflux   . Headache(784.0)   . Herpes zoster without mention of complication   . Hx: UTI (urinary tract infection)   . Hypertension    heart controlled w CHF  . Nontoxic uninodular goiter   . Obesity, unspecified   . Osteoarthrosis, unspecified whether generalized or localized, unspecified site   . Unspecified sleep apnea   . Urine incontinence    hx of    Assessment: 74 year old female with afib on warfarin  PTA.  presents with GI bleeding in the setting of supratherapeutic INR, AKI, UTI. 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  Patient also with mechanical mitral valve replacement with goal INR 2.5-3.5. Home regimen 7 mg Mon, Tues, Thurs, Fri and 8 mg Wed, Sat, Sun. Warfarin has been on hold for endoscopic procedure. Pharmacy now consulted to restart warfarin. Patient continues on heparin drip.  Date INR Dose 6/16 4.6 -- 6/17 3.3 -- 6/18 2.7 -- 6/19 1.8 -- 6/20 1.4 8 mg 6/21 1.6 8 mg 6/22 2.3 2 mg 6/23 2.1       5 mg 6/24     2.0       6 mg 6/25     2.0    Goal of Therapy:  Heparin level 0.3-0.7 units/ml  INR 2.5-3.5 Monitor platelets by anticoagulation protocol: Yes   Plan:  Heparin :6/25 0450 HL=0.46, therapeutic x 2. Will continue heparin drip at 1200 units/hr. Recheck HL and CBC with morning labs.  Warfarin: INR=2.0 still subtherapeutic. Will order warfarin 7.5 mg tonight. INR with morning labs.  Paulina Fusi, PharmD, BCPS 06/16/2020 9:18 AM

## 2020-06-16 NOTE — Progress Notes (Signed)
PROGRESS NOTE    Betty [name]  TIW:[id number] DOB: 01-09-1946 DOA: 06/07/2020 PCP: Dion Body, MD    Brief Narrative:  74 year old female with history of diastolic dysfunction, ejection fraction 55%, interstitial lung disease, paroxysmal atrial fibrillation, history of major depression, diabetes on insulin, vasculitis, PE, history of mitral valve repair on chronic anticoagulation therapy with Coumadin, pacemaker in place, stage IV chronic kidney disease and history of sleep apnea on BiPAP was admitted to the intensive care unit on 06/07/2020 with dark stools at home.  She was found with blood pressure 80/49, INR 4.6 hemoglobin 6.1 in the ER.  Urine consistent with UTI.  COVID-19 negative. COVID-19 negative Blood cultures 6/16, negative Urine culture 6/16, Klebsiella sensitive to ceftriaxone. 6/20, EGD showing multiple bleeding angiectasia in the stomach.  Developed some delirium postoperative on 6/28 that is improved.  Assessment & Plan:   Active Problems:   Afib (HCC)   Shock ([place])  Symptomatic anemia, acute GI bleeding in the setting of supratherapeutic INR: Received total 2 units of PRBC transfusion, hemoglobin is stable since then.  We will continue close monitoring. Underwent EGD on 6/20, found to have telangiectasia in the stomach treated with APC.  Followed by GI.  Currently on oral Protonix.  Altered mental status: Likely postop delirium.  Improved and normalized.  Hypotension in the setting of hemorrhagic shock: Improved.  Torsemide, beta-blockers and aspirin, Aldactone on hold.  Her blood pressures are still soft.  We will continue to hold.  Acute UTI present on admission: Klebsiella pneumonia.  Treated with Rocephin for 7 days.  Finished antibiotic therapy.  History of COPD and asthma without acute exacerbation, obstructive sleep apnea: On oxygen supplementation at home, continued.  Keep on oxygen to keep sats more than 88%.  As needed bronchodilators. BiPAP at  night at home settings.  Tolerating well.  Type 2 diabetes: Fairly controlled on current dose of insulin.  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  Goal 2.5-3.5.  No active bleeding.  Continue to redose Coumadin.  Heparin to be continued until INR is more than 2.5. Can bridge with Lovenox if discharged to skilled nursing facility.  Acute kidney injury: Due to #1.  Improved adequately.  Normal renal functions.  Physical deconditioning: Work with PT OT.  Recommend skilled nursing facility placement before going home to independent living.   DVT prophylaxis: SCDs Start: 06/07/20 1406 warfarin (COUMADIN) tablet 7.5 mg   Code Status: Full code Family Communication: None today. Disposition Plan: Status is: Inpatient  Remains inpatient appropriate because:Inpatient level of care appropriate due to severity of illness   Dispo: The patient is from: Home              Anticipated d/c is to: Skilled nursing facility.              Anticipated d/c date is: 1 day              Patient currently is medically stable to transfer to skilled level of care when available.  Not safe to go home.   Consultants:   Cardiology  Critical care  GI  Procedures:   EGD 6/20  Antimicrobials:  Antibiotics Given (last 72 hours)    Date/Time Action Medication Dose Rate   06/13/20 2117 New Bag/Given   cefTRIAXone (ROCEPHIN) 1 g in sodium chloride 0.9 % 100 mL IVPB 1 g 200 mL/hr         Subjective: Patient seen and examined.  No overnight events.  She was  feeling better today.  Denies any chest pain or palpitations.  Denies any nausea or vomiting.  Looking forward to find some good rehab for short-term before going home.  Objective: Vitals:   06/15/20 1552 06/15/20 2326 06/16/20 0810 06/16/20 0936  BP: 110/60 108/84 (!) 84/64 104/66  Pulse: 79 71 81 89  Resp: 16 17 16    Temp: 98.5 F (36.9 C) 98.7 F (37.1 C) 97.6 F (36.4 C)   TempSrc: Oral Oral Oral   SpO2:  98% (!) 88%   Weight:       Height:        Intake/Output Summary (Last 24 hours) at 06/16/2020 1350 Last data filed at 06/16/2020 0520 Gross per 24 hour  Intake 608.22 ml  Output --  Net 608.22 ml   Filed Weights   06/11/20 0818 06/12/20 0500 06/13/20 0500  Weight: 91.8 kg 91.6 kg 95.5 kg    Examination:  General exam: Appears calm and comfortable, chronically sick looking.  Not in any distress. Patient is on 2 L of nasal cannula oxygen.  Sitting in chair and eating breakfast. She is alert oriented x4. Respiratory system: Clear to auscultation. Respiratory effort normal. Cardiovascular system: S1 & S2 heard, mechanical heart murmur present.   Gastrointestinal system: Abdomen is nondistended, soft and nontender. No organomegaly or masses felt. Normal bowel sounds heard. Central nervous system: Alert and oriented. No focal neurological deficits.  Data Reviewed: I have personally reviewed following labs and imaging studies  CBC: Recent Labs  Lab 06/11/20 0404 06/12/20 0559 06/13/20 0422 06/14/20 0629 06/15/20 0511  WBC 8.5 5.8 7.1 8.4 7.4  HGB 7.6* 8.0* 7.7* 9.1* 9.4*  HCT 23.5* 26.4* 25.3* 28.7* 30.7*  MCV 93.3 97.4 96.9 92.9 93.6  PLT 180 172 174 211 250   Basic Metabolic Panel: Recent Labs  Lab 06/10/20 0453 06/11/20 0404 06/11/20 1436 06/12/20 0559 06/13/20 0856  NA 140 139 141 144 141  K 3.3* 4.6 4.6 4.3 3.9  CL 99 103 105 106 105  CO2 34* 33* 29 30 28   GLUCOSE 166* 212* 224* 147* 140*  BUN 75* 37* 28* 30* 28*  CREATININE 1.19* 0.91 0.88 1.00 0.98  CALCIUM 8.1* 8.0* 8.3* 8.3* 8.4*  MG 2.5* 2.2  --  2.3 1.9  PHOS 2.5 2.2* 2.4* 3.1 3.0   GFR: Estimated Creatinine Clearance: 62.9 mL/min (by C-G formula based on SCr of 0.98 mg/dL). Liver Function Tests: Recent Labs  Lab 06/10/20 0453 06/11/20 1436  ALBUMIN 3.1* 3.3*   No results for input(s): LIPASE, AMYLASE in the last 168 hours. Recent Labs  Lab 06/11/20 1436  AMMONIA 20   Coagulation Profile: Recent Labs  Lab  06/12/20 0559 06/13/20 0422 06/14/20 0629 06/15/20 0511 06/16/20 0450  INR 1.6* 2.3* 2.1* 2.0* 2.0*   Cardiac Enzymes: No results for input(s): CKTOTAL, CKMB, CKMBINDEX, TROPONINI in the last 168 hours. BNP (last 3 results) No results for input(s): PROBNP in the last 8760 hours. HbA1C: No results for input(s): HGBA1C in the last 72 hours. CBG: Recent Labs  Lab 06/15/20 1211 06/15/20 1557 06/15/20 2101 06/16/20 0811 06/16/20 1212  GLUCAP 189* 98 175* 88 153*   Lipid Profile: No results for input(s): CHOL, HDL, LDLCALC, TRIG, CHOLHDL, LDLDIRECT in the last 72 hours. Thyroid Function Tests: No results for input(s): TSH, T4TOTAL, FREET4, T3FREE, THYROIDAB in the last 72 hours. Anemia Panel: No results for input(s): VITAMINB12, FOLATE, FERRITIN, TIBC, IRON, RETICCTPCT in the last 72 hours. Sepsis Labs: No results for input(s): PROCALCITON, LATICACIDVEN  in the last 168 hours.  Recent Results (from the past 240 hour(s))  SARS Coronavirus 2 by RT PCR (hospital order, performed in [facility] hospital lab) Nasopharyngeal Nasopharyngeal Swab     Status: None   Collection Time: 06/07/20  1:34 PM   Specimen: Nasopharyngeal Swab  Result Value Ref Range Status   SARS Coronavirus 2 NEGATIVE NEGATIVE Final    Comment: (NOTE) SARS-CoV-2 target nucleic acids are NOT DETECTED.  The SARS-CoV-2 RNA is generally detectable in upper and lower respiratory specimens during the acute phase of infection. The lowest concentration of SARS-CoV-2 viral copies this assay can detect is 250 copies / mL. A negative result does not preclude SARS-CoV-2 infection and should not be used as the sole basis for treatment or other patient management decisions.  A negative result may occur with improper specimen collection / handling, submission of specimen other than nasopharyngeal swab, presence of viral mutation(s) within the areas targeted by this assay, and inadequate number of viral copies (<250 copies  / mL). A negative result must be combined with clinical observations, patient history, and epidemiological information.  Fact Sheet for Patients:   StrictlyIdeas.no  Fact Sheet for Healthcare Providers: BankingDealers.co.za  This test is not yet approved or  cleared by the [place] FDA and has been authorized for detection and/or diagnosis of SARS-CoV-2 by FDA under an Emergency Use Authorization (EUA).  This EUA will remain in effect (meaning this test can be used) for the duration of the COVID-19 declaration under Section 564(b)(1) of the Act, 21 U.S.C. section 360bbb-3(b)(1), unless the authorization is terminated or revoked sooner.  Performed at [facility], [address] [place], [place] [address]   Blood Culture (routine x 2)     Status: None   Collection Time: 06/07/20  5:24 PM   Specimen: BLOOD  Result Value Ref Range Status   Specimen Description BLOOD BLOOD RIGHT HAND  Final   Special Requests   Final    BOTTLES DRAWN AEROBIC AND ANAEROBIC Blood Culture adequate volume   Culture   Final    NO GROWTH 5 DAYS Performed at [facility], [address] [place], [place] [address]    Report Status 06/12/2020 FINAL  Final  Urine culture     Status: Abnormal   Collection Time: 06/07/20  5:24 PM   Specimen: Urine, Clean Catch  Result Value Ref Range Status   Specimen Description   Final    URINE, CLEAN CATCH Performed at [facility], [address], [place], [place] [address]    Special Requests   Final    NONE Performed at [facility], [place], [place] [address]    Culture >=100,000 COLONIES/mL KLEBSIELLA PNEUMONIAE (A)  Final   Report Status 06/09/2020 FINAL  Final   Organism ID, Bacteria KLEBSIELLA PNEUMONIAE (A)  Final      Susceptibility   Klebsiella pneumoniae - MIC*    AMPICILLIN RESISTANT Resistant     CEFAZOLIN <=4 SENSITIVE Sensitive      CEFTRIAXONE <=0.25 SENSITIVE Sensitive     CIPROFLOXACIN <=0.25 SENSITIVE Sensitive     GENTAMICIN <=1 SENSITIVE Sensitive     IMIPENEM <=0.25 SENSITIVE Sensitive     NITROFURANTOIN 64 INTERMEDIATE Intermediate     TRIMETH/SULFA <=20 SENSITIVE Sensitive     AMPICILLIN/SULBACTAM 4 SENSITIVE Sensitive     PIP/TAZO <=4 SENSITIVE Sensitive     * >=100,000 COLONIES/mL KLEBSIELLA PNEUMONIAE  MRSA PCR Screening     Status: None  Collection Time: 06/07/20  6:25 PM   Specimen: Nasopharyngeal  Result Value Ref Range Status   MRSA by PCR NEGATIVE NEGATIVE Final    Comment:        The GeneXpert MRSA Assay (FDA approved for NASAL specimens only), is one component of a comprehensive MRSA colonization surveillance program. It is not intended to diagnose MRSA infection nor to guide or monitor treatment for MRSA infections. Performed at [facility], [address] [place], [place] [address]          Radiology Studies: No results found.      Scheduled Meds:  sodium chloride   Intravenous Once   sodium chloride   Intravenous Once   sodium chloride   Intravenous Once   sodium chloride   Intravenous Once   azaTHIOprine  100 mg Oral Daily   Chlorhexidine Gluconate Cloth  6 each Topical Daily   gabapentin  100 mg Oral QHS   insulin aspart  0-5 Units Subcutaneous QHS   insulin aspart  0-9 Units Subcutaneous TID WC   mouth rinse  15 mL Mouth Rinse BID   melatonin  5 mg Oral QHS   pantoprazole  40 mg Oral Daily   phosphorus  500 mg Oral Once   polyethylene glycol  17 g Oral Daily   sodium chloride flush  10-40 mL Intracatheter Q12H   sodium chloride flush  3 mL Intravenous Q12H   warfarin  7.5 mg Oral ONCE-1600   Warfarin - Pharmacist Dosing Inpatient   Does not apply q1600   Continuous Infusions:  sodium chloride     sodium chloride Stopped (06/13/20 1629)   heparin 1,200 Units/hr (06/16/20 0520)     LOS: 9 days    Time spent: 25  minutes    Barb Merino, MD [facility] Pager [phone]

## 2020-06-17 LAB — GLUCOSE, CAPILLARY
Glucose-Capillary: 104 mg/dL — ABNORMAL HIGH (ref 70–99)
Glucose-Capillary: 180 mg/dL — ABNORMAL HIGH (ref 70–99)
Glucose-Capillary: 157 mg/dL — ABNORMAL HIGH (ref 70–99)
Glucose-Capillary: 90 mg/dL (ref 70–99)

## 2020-06-17 LAB — CBC
HCT: 30 % — ABNORMAL LOW (ref 36.0–46.0)
Hemoglobin: 9.1 g/dL — ABNORMAL LOW (ref 12.0–15.0)
MCH: 28.8 pg (ref 26.0–34.0)
MCHC: 30.3 g/dL (ref 30.0–36.0)
MCV: 94.9 fL (ref 80.0–100.0)
Platelets: 194 10*3/uL (ref 150–400)
RBC: 3.16 MIL/uL — ABNORMAL LOW (ref 3.87–5.11)
RDW: 15.9 % — ABNORMAL HIGH (ref 11.5–15.5)
WBC: 7.2 10*3/uL (ref 4.0–10.5)
nRBC: 0.3 % — ABNORMAL HIGH (ref 0.0–0.2)

## 2020-06-17 LAB — HEPARIN LEVEL (UNFRACTIONATED)
Heparin Unfractionated: <0.1 IU/mL — ABNORMAL LOW (ref 0.30–0.70)
Heparin Unfractionated: 0.58 IU/mL (ref 0.30–0.70)

## 2020-06-17 LAB — PROTIME-INR
INR: 2.2 — ABNORMAL HIGH (ref 0.8–1.2)
Prothrombin Time: 23.9 seconds — ABNORMAL HIGH (ref 11.4–15.2)

## 2020-06-17 MED ORDER — HEPARIN BOLUS VIA INFUSION
2500.0000 [IU] | Freq: Once | INTRAVENOUS | Status: DC
  Filled 2020-06-17: qty 2500

## 2020-06-17 MED ORDER — HEPARIN BOLUS VIA INFUSION
2000.0000 [IU] | Freq: Once | INTRAVENOUS | Status: AC
  Administered 2020-06-17: 2000 [IU] via INTRAVENOUS
  Filled 2020-06-17: qty 2000

## 2020-06-17 MED ORDER — WARFARIN SODIUM 7.5 MG PO TABS
7.5000 mg | ORAL_TABLET | Freq: Once | ORAL | Status: AC
  Administered 2020-06-17: 7.5 mg via ORAL
  Filled 2020-06-17: qty 1

## 2020-06-17 MED ORDER — NYSTATIN 100000 UNIT/GM EX OINT
1.0000 "application " | TOPICAL_OINTMENT | Freq: Two times a day (BID) | CUTANEOUS | Status: DC
  Administered 2020-06-17 – 2020-06-19 (×4): 1 via TOPICAL
  Filled 2020-06-17: qty 15

## 2020-06-17 NOTE — Progress Notes (Signed)
PROGRESS NOTE    Betty [name]  FTD:[id number] DOB: 1946/08/16 DOA: 06/07/2020 PCP: Dion Body, MD    Brief Narrative:  74 year old female with history of diastolic dysfunction, ejection fraction 55%, interstitial lung disease, paroxysmal atrial fibrillation, history of major depression, diabetes on insulin, vasculitis, PE, history of mitral valve repair on chronic anticoagulation therapy with Coumadin, pacemaker in place, stage IV chronic kidney disease and history of sleep apnea on BiPAP was admitted to the intensive care unit on 06/07/2020 with dark stools at home.  She was found with blood pressure 80/49, INR 4.6 hemoglobin 6.1 in the ER.  Urine consistent with UTI.  COVID-19 negative. COVID-19 negative Blood cultures 6/16, negative Urine culture 6/16, Klebsiella sensitive to ceftriaxone. 6/20, EGD showing multiple bleeding angiectasia in the stomach.  Developed some delirium postoperative on 6/20 that is improved. Waiting to go to skilled nursing rehab.  Assessment & Plan:   Active Problems:   Afib (HCC)   Shock ([place])  Symptomatic anemia, acute GI bleeding in the setting of supratherapeutic INR: Received total 2 units of PRBC transfusion, hemoglobin is stable since then.  We will continue close monitoring. Underwent EGD on 6/20, found to have telangiectasia in the stomach treated with APC.  Followed by GI.  Currently on oral Protonix.  Altered mental status: Likely postop delirium.  Improved and normalized.  Hypotension in the setting of hemorrhagic shock: Improved.  Torsemide, beta-blockers and aspirin, Aldactone on hold.  Her blood pressures are improving.  Will resume today.  Acute UTI present on admission: Klebsiella pneumonia.  Treated with Rocephin for 7 days.  Finished antibiotic therapy.  History of COPD and asthma without acute exacerbation, obstructive sleep apnea: On oxygen supplementation at home, continued.  Keep on oxygen to keep sats more than 88%.  As needed  bronchodilators. BiPAP at night at home settings.  Tolerating well.  Type 2 diabetes: Fairly controlled on current dose of insulin.  On oral hypoglycemics at home.  Will resume on discharge.  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  No active bleeding.  Continue to redose Coumadin.  Heparin to be continued until INR is more than 2.5. Can bridge with Lovenox if discharged to skilled nursing facility.  Acute kidney injury: Due to #1.  Improved adequately.  Normal renal functions.  Physical deconditioning: Work with PT OT.  Recommend skilled nursing facility placement before going home to independent living.  Anxiety and depression: Resume home medications including SSRI.  She is also on Lyrica for neuropathy that we will continue.  Gabapentin was added.   DVT prophylaxis: SCDs Start: 06/07/20 1406.  INR more than 2.   Code Status: Full code Family Communication: Called patient's son multiple times, unable to leave messages or talk. Disposition Plan: Status is: Inpatient  Remains inpatient appropriate because:Inpatient level of care appropriate due to severity of illness   Dispo: The patient is from: Home              Anticipated d/c is to: Skilled nursing facility.              Anticipated d/c date is: 1 day, when bed available.              Patient currently is medically stable to transfer to skilled level of care when available.  Not safe to go home.   Consultants:   Cardiology  Critical care  GI  Procedures:   EGD 6/20  Antimicrobials:  Antibiotics Given (last 72 hours)  None         Subjective: Seen and examined.  No overnight events.  Denies any new complaints.  Resuming her home medicine including Lyrica did help her.  Objective: Vitals:   06/16/20 0936 06/16/20 1556 06/17/20 0020 06/17/20 0748  BP: 104/66 112/69 111/72 93/71  Pulse: 89 75 71 78  Resp:  16 18 16   Temp:  (!) 97.5 F (36.4 C) (!) 97.3 F (36.3 C) 97.8 F (36.6  C)  TempSrc:  Oral Oral Oral  SpO2:  99% 100% 100%  Weight:      Height:        Intake/Output Summary (Last 24 hours) at 06/17/2020 1040 Last data filed at 06/17/2020 0300 Gross per 24 hour  Intake 188.87 ml  Output 400 ml  Net -211.13 ml   Filed Weights   06/11/20 0818 06/12/20 0500 06/13/20 0500  Weight: 91.8 kg 91.6 kg 95.5 kg    Examination: Physical Exam Constitutional:      Appearance: Normal appearance.  HENT:     Head: Normocephalic.  Cardiovascular:     Rate and Rhythm: Normal rate and regular rhythm.  Pulmonary:     Effort: Pulmonary effort is normal.     Breath sounds: Normal breath sounds.     Comments: Comfortable on 2 L oxygen. Abdominal:     Palpations: Abdomen is soft.  Musculoskeletal:     Right lower leg: No edema.  Neurological:     Mental Status: She is alert.    Data Reviewed: I have personally reviewed following labs and imaging studies  CBC: Recent Labs  Lab 06/12/20 0559 06/13/20 0422 06/14/20 0629 06/15/20 0511 06/17/20 0904  WBC 5.8 7.1 8.4 7.4 7.2  HGB 8.0* 7.7* 9.1* 9.4* 9.1*  HCT 26.4* 25.3* 28.7* 30.7* 30.0*  MCV 97.4 96.9 92.9 93.6 94.9  PLT 172 174 211 221 938   Basic Metabolic Panel: Recent Labs  Lab 06/11/20 0404 06/11/20 1436 06/12/20 0559 06/13/20 0856  NA 139 141 144 141  K 4.6 4.6 4.3 3.9  CL 103 105 106 105  CO2 33* 29 30 28   GLUCOSE 212* 224* 147* 140*  BUN 37* 28* 30* 28*  CREATININE 0.91 0.88 1.00 0.98  CALCIUM 8.0* 8.3* 8.3* 8.4*  MG 2.2  --  2.3 1.9  PHOS 2.2* 2.4* 3.1 3.0   GFR: Estimated Creatinine Clearance: 62.9 mL/min (by C-G formula based on SCr of 0.98 mg/dL). Liver Function Tests: Recent Labs  Lab 06/11/20 1436  ALBUMIN 3.3*   No results for input(s): LIPASE, AMYLASE in the last 168 hours. Recent Labs  Lab 06/11/20 1436  AMMONIA 20   Coagulation Profile: Recent Labs  Lab 06/13/20 0422 06/14/20 0629 06/15/20 0511 06/16/20 0450 06/17/20 0904  INR 2.3* 2.1* 2.0* 2.0* 2.2*    Cardiac Enzymes: No results for input(s): CKTOTAL, CKMB, CKMBINDEX, TROPONINI in the last 168 hours. BNP (last 3 results) No results for input(s): PROBNP in the last 8760 hours. HbA1C: No results for input(s): HGBA1C in the last 72 hours. CBG: Recent Labs  Lab 06/16/20 0811 06/16/20 1212 06/16/20 1557 06/16/20 2116 06/17/20 0745  GLUCAP 88 153* 181* 121* 104*   Lipid Profile: No results for input(s): CHOL, HDL, LDLCALC, TRIG, CHOLHDL, LDLDIRECT in the last 72 hours. Thyroid Function Tests: No results for input(s): TSH, T4TOTAL, FREET4, T3FREE, THYROIDAB in the last 72 hours. Anemia Panel: No results for input(s): VITAMINB12, FOLATE, FERRITIN, TIBC, IRON, RETICCTPCT in the last 72 hours. Sepsis Labs: No results for input(s):  PROCALCITON, LATICACIDVEN in the last 168 hours.  Recent Results (from the past 240 hour(s))  SARS Coronavirus 2 by RT PCR (hospital order, performed in [facility] hospital lab) Nasopharyngeal Nasopharyngeal Swab     Status: None   Collection Time: 06/07/20  1:34 PM   Specimen: Nasopharyngeal Swab  Result Value Ref Range Status   SARS Coronavirus 2 NEGATIVE NEGATIVE Final    Comment: (NOTE) SARS-CoV-2 target nucleic acids are NOT DETECTED.  The SARS-CoV-2 RNA is generally detectable in upper and lower respiratory specimens during the acute phase of infection. The lowest concentration of SARS-CoV-2 viral copies this assay can detect is 250 copies / mL. A negative result does not preclude SARS-CoV-2 infection and should not be used as the sole basis for treatment or other patient management decisions.  A negative result may occur with improper specimen collection / handling, submission of specimen other than nasopharyngeal swab, presence of viral mutation(s) within the areas targeted by this assay, and inadequate number of viral copies (<250 copies / mL). A negative result must be combined with clinical observations, patient history, and  epidemiological information.  Fact Sheet for Patients:   StrictlyIdeas.no  Fact Sheet for Healthcare Providers: BankingDealers.co.za  This test is not yet approved or  cleared by the [place] FDA and has been authorized for detection and/or diagnosis of SARS-CoV-2 by FDA under an Emergency Use Authorization (EUA).  This EUA will remain in effect (meaning this test can be used) for the duration of the COVID-19 declaration under Section 564(b)(1) of the Act, 21 U.S.C. section 360bbb-3(b)(1), unless the authorization is terminated or revoked sooner.  Performed at [facility], [address] [place], [place] [address]   Blood Culture (routine x 2)     Status: None   Collection Time: 06/07/20  5:24 PM   Specimen: BLOOD  Result Value Ref Range Status   Specimen Description BLOOD BLOOD RIGHT HAND  Final   Special Requests   Final    BOTTLES DRAWN AEROBIC AND ANAEROBIC Blood Culture adequate volume   Culture   Final    NO GROWTH 5 DAYS Performed at [facility], [address] [place], [place] [address]    Report Status 06/12/2020 FINAL  Final  Urine culture     Status: Abnormal   Collection Time: 06/07/20  5:24 PM   Specimen: Urine, Clean Catch  Result Value Ref Range Status   Specimen Description   Final    URINE, CLEAN CATCH Performed at [facility], [address], [place], [place] [address]    Special Requests   Final    NONE Performed at [facility], [address] [place], [place] [address]    Culture >=100,000 COLONIES/mL KLEBSIELLA PNEUMONIAE (A)  Final   Report Status 06/09/2020 FINAL  Final   Organism ID, Bacteria KLEBSIELLA PNEUMONIAE (A)  Final      Susceptibility   Klebsiella pneumoniae - MIC*    AMPICILLIN RESISTANT Resistant     CEFAZOLIN <=4 SENSITIVE Sensitive     CEFTRIAXONE <=0.25 SENSITIVE Sensitive     CIPROFLOXACIN <=0.25 SENSITIVE Sensitive      GENTAMICIN <=1 SENSITIVE Sensitive     IMIPENEM <=0.25 SENSITIVE Sensitive     NITROFURANTOIN 64 INTERMEDIATE Intermediate     TRIMETH/SULFA <=20 SENSITIVE Sensitive     AMPICILLIN/SULBACTAM 4 SENSITIVE Sensitive     PIP/TAZO <=4 SENSITIVE Sensitive     * >=100,000 COLONIES/mL KLEBSIELLA PNEUMONIAE  MRSA PCR Screening  Status: None   Collection Time: 06/07/20  6:25 PM   Specimen: Nasopharyngeal  Result Value Ref Range Status   MRSA by PCR NEGATIVE NEGATIVE Final    Comment:        The GeneXpert MRSA Assay (FDA approved for NASAL specimens only), is one component of a comprehensive MRSA colonization surveillance program. It is not intended to diagnose MRSA infection nor to guide or monitor treatment for MRSA infections. Performed at [facility], [address] [place], [place] [address]          Radiology Studies: No results found.      Scheduled Meds: . sodium chloride   Intravenous Once  . sodium chloride   Intravenous Once  . sodium chloride   Intravenous Once  . sodium chloride   Intravenous Once  . atorvastatin  20 mg Oral QHS  . azaTHIOprine  100 mg Oral Daily  . Chlorhexidine Gluconate Cloth  6 each Topical Daily  . gabapentin  100 mg Oral QHS  . insulin aspart  0-5 Units Subcutaneous QHS  . insulin aspart  0-9 Units Subcutaneous TID WC  . mouth rinse  15 mL Mouth Rinse BID  . melatonin  5 mg Oral QHS  . metoprolol succinate  25 mg Oral Daily  . pantoprazole  40 mg Oral Daily  . phosphorus  500 mg Oral Once  . polyethylene glycol  17 g Oral Daily  . pregabalin  75 mg Oral Daily  . rOPINIRole  3 mg Oral TID  . sodium chloride flush  10-40 mL Intracatheter Q12H  . sodium chloride flush  3 mL Intravenous Q12H  . traZODone  50 mg Oral QHS  . venlafaxine XR  150 mg Oral Daily  . venlafaxine XR  37.5 mg Oral Daily  . Warfarin - Pharmacist Dosing Inpatient   Does not apply q1600   Continuous Infusions: . sodium chloride    . sodium  chloride Stopped (06/13/20 1629)  . heparin Stopped (06/16/20 2123)     LOS: 10 days    Time spent: 25 minutes    Barb Merino, MD [facility] Pager [phone]

## 2020-06-17 NOTE — Progress Notes (Signed)
[place] for heparin + warfarin Indication: afib/mechanical mitral valve   Allergies  Allergen Reactions  . Flecainide Shortness Of Breath and Other (See Comments)    Reaction: dizziness   . Amiodarone Other (See Comments)    Pt states that this medication causes lung bleeding.      Patient Measurements: Height: 5\' 9"  (175.3 cm) Weight: 95.5 kg (210 lb 8.6 oz) IBW/kg (Calculated) : 66.2 Heparin Dosing Weight: 84.9 kg  Vital Signs: Temp: 97.8 F (36.6 C) (06/26 0748) Temp Source: Oral (06/26 0748) BP: 93/71 (06/26 0748) Pulse Rate: 78 (06/26 0748)  Labs: Recent Labs    06/15/20 0511 06/15/20 0511 06/15/20 1526 06/16/20 0450 06/17/20 0904  HGB 9.4*  --   --   --  9.1*  HCT 30.7*  --   --   --  30.0*  PLT 221  --   --   --  194  LABPROT 21.7*  --   --  21.8* 23.9*  INR 2.0*  --   --  2.0* 2.2*  HEPARINUNFRC 0.25*   < > 0.63 0.46 <0.10*   < > = values in this interval not displayed.    Estimated Creatinine Clearance: 62.9 mL/min (by C-G formula based on SCr of 0.98 mg/dL).   Medical History: Past Medical History:  Diagnosis Date  . Acute diastolic heart failure ([place])   . Allergy   . 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 Asthma   . Atrial fibrillation ([place])   . Backache, unspecified   . Cancer ([place])    skin  . Cardiomegaly   . COPD (chronic obstructive pulmonary disease) ([place])   . Diabetes mellitus without complication ([place])   . 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 Esophageal reflux   . Headache(784.0)   . Herpes zoster without mention of complication   . Hx: UTI (urinary tract infection)   . Hypertension    heart controlled w CHF  . Nontoxic uninodular goiter   . Obesity, unspecified   . Osteoarthrosis, unspecified whether generalized or localized, unspecified site   . Unspecified sleep apnea   . Urine incontinence    hx of    Assessment: 74 year old female with afib on warfarin  PTA.  presents with GI bleeding in the setting of supratherapeutic INR, AKI, UTI. 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  Patient also with mechanical mitral valve replacement with goal INR 2.5-3.5. Home regimen 7 mg Mon, Tues, Thurs, Fri and 8 mg Wed, Sat, Sun. Warfarin has been on hold for endoscopic procedure. Pharmacy now consulted to restart warfarin. Patient continues on heparin drip.  Date INR Dose 6/16 4.6 -- 6/17 3.3 -- 6/18 2.7 -- 6/19 1.8 -- 6/20 1.4 8 mg 6/21 1.6 8 mg 6/22 2.3 2 mg 6/23 2.1       5 mg 6/24     2.0       6 mg 6/25     2.0 7.5 mg 6/26 2.2    Goal of Therapy:  Heparin level 0.3-0.7 units/ml  INR 2.5-3.5 Monitor platelets by anticoagulation protocol: Yes   Plan:  Heparin :6/26 0904 HL=<0.10. Will order Heparin bolus of 2000 units and increase heparin drip to 1300 units/hr. Recheck HL at 2000 and CBC with morning labs.  Warfarin: INR=2.2 still subtherapeutic. Will order warfarin 7.5 mg again tonight. INR with morning labs.  Chinita Greenland PharmD Clinical  Pharmacist 06/17/2020

## 2020-06-17 NOTE — Progress Notes (Signed)
[place] for heparin + warfarin Indication: afib/mechanical mitral valve   Allergies  Allergen Reactions  . Flecainide Shortness Of Breath and Other (See Comments)    Reaction: dizziness   . Amiodarone Other (See Comments)    Pt states that this medication causes lung bleeding.      Patient Measurements: Height: 5\' 9"  (175.3 cm) Weight: 95.5 kg (210 lb 8.6 oz) IBW/kg (Calculated) : 66.2 Heparin Dosing Weight: 84.9 kg  Vital Signs: Temp: 98.1 F (36.7 C) (06/26 1533) Temp Source: Oral (06/26 1530) BP: 105/64 (06/26 1533) Pulse Rate: 61 (06/26 1533)  Labs: Recent Labs    06/15/20 0511 06/15/20 1526 06/16/20 0450 06/17/20 0904 06/17/20 2018  HGB 9.4*  --   --  9.1*  --   HCT 30.7*  --   --  30.0*  --   PLT 221  --   --  194  --   LABPROT 21.7*  --  21.8* 23.9*  --   INR 2.0*  --  2.0* 2.2*  --   HEPARINUNFRC 0.25*   < > 0.46 <0.10* 0.58   < > = values in this interval not displayed.    Estimated Creatinine Clearance: 62.9 mL/min (by C-G formula based on SCr of 0.98 mg/dL).   Medical History: Past Medical History:  Diagnosis Date  . Acute diastolic heart failure ([place])   . Allergy   . 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 Asthma   . Atrial fibrillation ([place])   . Backache, unspecified   . Cancer ([place])    skin  . Cardiomegaly   . COPD (chronic obstructive pulmonary disease) ([place])   . Diabetes mellitus without complication ([place])   . 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 Esophageal reflux   . Headache(784.0)   . Herpes zoster without mention of complication   . Hx: UTI (urinary tract infection)   . Hypertension    heart controlled w CHF  . Nontoxic uninodular goiter   . Obesity, unspecified   . Osteoarthrosis, unspecified whether generalized or localized, unspecified site   . Unspecified sleep apnea   . Urine incontinence    hx of    Assessment: 74 year old female with afib on  warfarin PTA.  presents with GI bleeding in the setting of supratherapeutic INR, AKI, UTI. 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  Patient also with mechanical mitral valve replacement with goal INR 2.5-3.5. Home regimen 7 mg Mon, Tues, Thurs, Fri and 8 mg Wed, Sat, Sun. Warfarin has been on hold for endoscopic procedure. Pharmacy now consulted to restart warfarin. Patient continues on heparin drip.  Date INR Dose 6/16 4.6 -- 6/17 3.3 -- 6/18 2.7 -- 6/19 1.8 -- 6/20 1.4 8 mg 6/21 1.6 8 mg 6/22 2.3 2 mg 6/23 2.1       5 mg 6/24     2.0       6 mg 6/25     2.0 7.5 mg 6/26 2.2    Goal of Therapy:  Heparin level 0.3-0.7 units/ml  INR 2.5-3.5 Monitor platelets by anticoagulation protocol: Yes   Plan:  Heparin :6/26 @ 2018 HL=0.58. Continue current heparin infusion rate of 1300 units/hr. Recheck confirmatory HL in 5 hours and CBC with morning labs.  Warfarin: INR=2.2 still subtherapeutic. Will order warfarin 7.5 mg again tonight. INR with morning labs.  Pernell Dupre, PharmD, BCPS Clinical Pharmacist  06/17/2020 8:49 PM

## 2020-06-18 LAB — CBC
HCT: 30.1 % — ABNORMAL LOW (ref 36.0–46.0)
Hemoglobin: 9.1 g/dL — ABNORMAL LOW (ref 12.0–15.0)
MCH: 28.2 pg (ref 26.0–34.0)
MCHC: 30.2 g/dL (ref 30.0–36.0)
MCV: 93.2 fL (ref 80.0–100.0)
Platelets: 209 10*3/uL (ref 150–400)
RBC: 3.23 MIL/uL — ABNORMAL LOW (ref 3.87–5.11)
RDW: 15.9 % — ABNORMAL HIGH (ref 11.5–15.5)
WBC: 6.4 10*3/uL (ref 4.0–10.5)
nRBC: 0 % (ref 0.0–0.2)

## 2020-06-18 LAB — GLUCOSE, CAPILLARY
Glucose-Capillary: 121 mg/dL — ABNORMAL HIGH (ref 70–99)
Glucose-Capillary: 135 mg/dL — ABNORMAL HIGH (ref 70–99)
Glucose-Capillary: 140 mg/dL — ABNORMAL HIGH (ref 70–99)
Glucose-Capillary: 122 mg/dL — ABNORMAL HIGH (ref 70–99)

## 2020-06-18 LAB — PROTIME-INR
INR: 3.2 — ABNORMAL HIGH (ref 0.8–1.2)
Prothrombin Time: 31.6 seconds — ABNORMAL HIGH (ref 11.4–15.2)

## 2020-06-18 LAB — HEPARIN LEVEL (UNFRACTIONATED): Heparin Unfractionated: 0.64 IU/mL (ref 0.30–0.70)

## 2020-06-18 MED ORDER — WARFARIN SODIUM 6 MG PO TABS
7.0000 mg | ORAL_TABLET | Freq: Once | ORAL | Status: AC
  Administered 2020-06-18: 7 mg via ORAL
  Filled 2020-06-18 (×2): qty 1

## 2020-06-18 MED ORDER — DICLOFENAC SODIUM 1 % EX GEL
2.0000 g | Freq: Four times a day (QID) | CUTANEOUS | Status: DC
  Administered 2020-06-18 – 2020-06-19 (×4): 2 g via TOPICAL
  Filled 2020-06-18: qty 100

## 2020-06-18 NOTE — Progress Notes (Signed)
PROGRESS NOTE    Betty [name]  PFX:[id number] DOB: March 03, 1946 DOA: 06/07/2020 PCP: Dion Body, MD    Brief Narrative:  74 year old female with history of diastolic dysfunction, ejection fraction 55%, interstitial lung disease, paroxysmal atrial fibrillation, history of major depression, diabetes on insulin, vasculitis, PE, history of mitral valve repair on chronic anticoagulation therapy with Coumadin, pacemaker in place, stage IV chronic kidney disease and history of sleep apnea on BiPAP was admitted to the intensive care unit on 06/07/2020 with dark stools at home.  She was found with blood pressure 80/49, INR 4.6 hemoglobin 6.1 in the ER.  Urine consistent with UTI.  COVID-19 negative. COVID-19 negative Blood cultures 6/16, negative Urine culture 6/16, Klebsiella sensitive to ceftriaxone. 6/20, EGD showing multiple bleeding angiectasia in the stomach.  Developed some delirium postoperative on 6/20 that is improved. Waiting to go to skilled nursing rehab.  Assessment & Plan:   Active Problems:   Afib (HCC)   Shock ([place])  Symptomatic anemia, acute GI bleeding in the setting of supratherapeutic INR: Received total 2 units of PRBC transfusion, hemoglobin is stable since then.  We will continue close monitoring. Underwent EGD on 6/20, found to have telangiectasia in the stomach treated with APC.  Followed by GI.  Currently on oral Protonix.  Altered mental status: Likely postop delirium.  Improved and normalized.  Hypotension in the setting of hemorrhagic shock: Improved.  Torsemide, beta-blockers and aspirin, Aldactone resumed and tolerating well.   Acute UTI present on admission: Klebsiella pneumonia.  Treated with Rocephin for 7 days.  Finished antibiotic therapy.  History of COPD and asthma without acute exacerbation, obstructive sleep apnea: On oxygen supplementation at home, continued.  Keep on oxygen to keep sats more than 88%.  As needed bronchodilators. BiPAP at night  at home settings.  Tolerating well.  Type 2 diabetes: Fairly controlled on current dose of insulin.  On oral hypoglycemics at home.  Will resume on discharge.  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  No active bleeding.  Continue to redose Coumadin.  INR is 3.2, persistently more than 2.  Discontinue heparin.  Acute kidney injury: Due to #1.  Improved adequately.  Normal renal functions.  Physical deconditioning: Work with PT OT.  Recommend skilled nursing facility placement before going home to independent living.  Anxiety and depression: Resume home medications including SSRI.  She is also on Lyrica for neuropathy that we will continue.  Gabapentin was added.   DVT prophylaxis: SCDs Start: 06/07/20 1406.  INR more than 3.  No need for SCDs.   Code Status: Full code Family Communication: Called patient's son multiple times, unable to leave messages or talk. Disposition Plan: Status is: Inpatient  Remains inpatient appropriate because:Inpatient level of care appropriate due to severity of illness   Dispo: The patient is from: Home              Anticipated d/c is to: Skilled nursing facility.              Anticipated d/c date is: 1 day, when bed available.              Patient currently is medically stable to transfer to skilled level of care when available.  Not safe to go home.   Consultants:   Cardiology  Critical care  GI  Procedures:   EGD 6/20  Antimicrobials:  Antibiotics Given (last 72 hours)    None  Subjective: Seen and examined.  She had some bleeding from her nose that is controlled.  Left arm IV site hurts with the IV heparin is running and also has some tenderness.  Objective: Vitals:   06/17/20 1530 06/17/20 1533 06/17/20 2352 06/18/20 0740  BP: 92/76 105/64 (!) 86/69 126/61  Pulse: 65 61 67 77  Resp: 16 16 16 18   Temp: 98.1 F (36.7 C) 98.1 F (36.7 C) 97.6 F (36.4 C) (!) 97.4 F (36.3 C)  TempSrc: Oral   Oral Oral  SpO2: (!) 82% 93% 98% 100%  Weight:      Height:        Intake/Output Summary (Last 24 hours) at 06/18/2020 1319 Last data filed at 06/18/2020 2831 Gross per 24 hour  Intake 240 ml  Output 0 ml  Net 240 ml   Filed Weights   06/11/20 0818 06/12/20 0500 06/13/20 0500  Weight: 91.8 kg 91.6 kg 95.5 kg    Examination: Physical Exam Constitutional:      Appearance: Normal appearance.  HENT:     Head: Normocephalic.  Cardiovascular:     Rate and Rhythm: Normal rate and regular rhythm.  Pulmonary:     Effort: Pulmonary effort is normal.     Breath sounds: Normal breath sounds.     Comments: Comfortable on 2 L oxygen. Abdominal:     Palpations: Abdomen is soft.  Musculoskeletal:     Right lower leg: No edema.  Neurological:     Mental Status: She is alert.    Data Reviewed: I have personally reviewed following labs and imaging studies  CBC: Recent Labs  Lab 06/13/20 0422 06/14/20 0629 06/15/20 0511 06/17/20 0904 06/18/20 0153  WBC 7.1 8.4 7.4 7.2 6.4  HGB 7.7* 9.1* 9.4* 9.1* 9.1*  HCT 25.3* 28.7* 30.7* 30.0* 30.1*  MCV 96.9 92.9 93.6 94.9 93.2  PLT 174 211 221 194 517   Basic Metabolic Panel: Recent Labs  Lab 06/11/20 1436 06/12/20 0559 06/13/20 0856  NA 141 144 141  K 4.6 4.3 3.9  CL 105 106 105  CO2 29 30 28   GLUCOSE 224* 147* 140*  BUN 28* 30* 28*  CREATININE 0.88 1.00 0.98  CALCIUM 8.3* 8.3* 8.4*  MG  --  2.3 1.9  PHOS 2.4* 3.1 3.0   GFR: Estimated Creatinine Clearance: 62.9 mL/min (by C-G formula based on SCr of 0.98 mg/dL). Liver Function Tests: Recent Labs  Lab 06/11/20 1436  ALBUMIN 3.3*   No results for input(s): LIPASE, AMYLASE in the last 168 hours. Recent Labs  Lab 06/11/20 1436  AMMONIA 20   Coagulation Profile: Recent Labs  Lab 06/14/20 0629 06/15/20 0511 06/16/20 0450 06/17/20 0904 06/18/20 0153  INR 2.1* 2.0* 2.0* 2.2* 3.2*   Cardiac Enzymes: No results for input(s): CKTOTAL, CKMB, CKMBINDEX, TROPONINI in  the last 168 hours. BNP (last 3 results) No results for input(s): PROBNP in the last 8760 hours. HbA1C: No results for input(s): HGBA1C in the last 72 hours. CBG: Recent Labs  Lab 06/17/20 1156 06/17/20 1650 06/17/20 2141 06/18/20 0740 06/18/20 1143  GLUCAP 180* 157* 90 121* 135*   Lipid Profile: No results for input(s): CHOL, HDL, LDLCALC, TRIG, CHOLHDL, LDLDIRECT in the last 72 hours. Thyroid Function Tests: No results for input(s): TSH, T4TOTAL, FREET4, T3FREE, THYROIDAB in the last 72 hours. Anemia Panel: No results for input(s): VITAMINB12, FOLATE, FERRITIN, TIBC, IRON, RETICCTPCT in the last 72 hours. Sepsis Labs: No results for input(s): PROCALCITON, LATICACIDVEN in the last 168 hours.  No results found for this or any previous visit (from the past 240 hour(s)).       Radiology Studies: No results found.      Scheduled Meds: . sodium chloride   Intravenous Once  . sodium chloride   Intravenous Once  . sodium chloride   Intravenous Once  . sodium chloride   Intravenous Once  . atorvastatin  20 mg Oral QHS  . azaTHIOprine  100 mg Oral Daily  . Chlorhexidine Gluconate Cloth  6 each Topical Daily  . gabapentin  100 mg Oral QHS  . insulin aspart  0-5 Units Subcutaneous QHS  . insulin aspart  0-9 Units Subcutaneous TID WC  . mouth rinse  15 mL Mouth Rinse BID  . melatonin  5 mg Oral QHS  . metoprolol succinate  25 mg Oral Daily  . nystatin ointment  1 application Topical BID  . pantoprazole  40 mg Oral Daily  . phosphorus  500 mg Oral Once  . polyethylene glycol  17 g Oral Daily  . pregabalin  75 mg Oral Daily  . rOPINIRole  3 mg Oral TID  . sodium chloride flush  10-40 mL Intracatheter Q12H  . sodium chloride flush  3 mL Intravenous Q12H  . traZODone  50 mg Oral QHS  . venlafaxine XR  150 mg Oral Daily  . venlafaxine XR  37.5 mg Oral Daily  . warfarin  7 mg Oral ONCE-1600  . Warfarin - Pharmacist Dosing Inpatient   Does not apply q1600   Continuous  Infusions: . sodium chloride    . sodium chloride Stopped (06/13/20 1629)     LOS: 11 days    Time spent: 25 minutes    Barb Merino, MD [facility] Pager [phone]

## 2020-06-18 NOTE — Progress Notes (Signed)
[place] for heparin + warfarin Indication: afib/mechanical mitral valve   Allergies  Allergen Reactions  . Flecainide Shortness Of Breath and Other (See Comments)    Reaction: dizziness   . Amiodarone Other (See Comments)    Pt states that this medication causes lung bleeding.      Patient Measurements: Height: 5\' 9"  (175.3 cm) Weight: 95.5 kg (210 lb 8.6 oz) IBW/kg (Calculated) : 66.2 Heparin Dosing Weight: 84.9 kg  Vital Signs: Temp: 97.6 F (36.4 C) (06/26 2352) Temp Source: Oral (06/26 2352) BP: 86/69 (06/26 2352) Pulse Rate: 67 (06/26 2352)  Labs: Recent Labs    06/15/20 0511 06/15/20 1526 06/16/20 0450 06/16/20 0450 06/17/20 0904 06/17/20 2018 06/18/20 0153  HGB 9.4*  --   --   --  9.1*  --  9.1*  HCT 30.7*  --   --   --  30.0*  --  30.1*  PLT 221  --   --   --  194  --  209  LABPROT 21.7*  --  21.8*  --  23.9*  --  31.6*  INR 2.0*  --  2.0*  --  2.2*  --  3.2*  HEPARINUNFRC 0.25*   < > 0.46   < > <0.10* 0.58 0.64   < > = values in this interval not displayed.    Estimated Creatinine Clearance: 62.9 mL/min (by C-G formula based on SCr of 0.98 mg/dL).   Medical History: Past Medical History:  Diagnosis Date  . Acute diastolic heart failure ([place])   . Allergy   . 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 Asthma   . Atrial fibrillation ([place])   . Backache, unspecified   . Cancer ([place])    skin  . Cardiomegaly   . COPD (chronic obstructive pulmonary disease) ([place])   . Diabetes mellitus without complication ([place])   . 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 Esophageal reflux   . Headache(784.0)   . Herpes zoster without mention of complication   . Hx: UTI (urinary tract infection)   . Hypertension    heart controlled w CHF  . Nontoxic uninodular goiter   . Obesity, unspecified   . Osteoarthrosis, unspecified whether generalized or localized, unspecified site   . Unspecified sleep apnea    . Urine incontinence    hx of    Assessment: 74 year old female with afib on warfarin PTA.  presents with GI bleeding in the setting of supratherapeutic INR, AKI, UTI. 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  Patient also with mechanical mitral valve replacement with goal INR 2.5-3.5. Home regimen 7 mg Mon, Tues, Thurs, Fri and 8 mg Wed, Sat, Sun. Warfarin has been on hold for endoscopic procedure. Pharmacy now consulted to restart warfarin. Patient continues on heparin drip.  Date INR Dose 6/16 4.6 -- 6/17 3.3 -- 6/18 2.7 -- 6/19 1.8 -- 6/20 1.4 8 mg 6/21 1.6 8 mg 6/22 2.3 2 mg 6/23 2.1       5 mg 6/24     2.0       6 mg 6/25     2.0 7.5 mg 6/26 2.2    Goal of Therapy:  Heparin level 0.3-0.7 units/ml  INR 2.5-3.5 Monitor platelets by anticoagulation protocol: Yes   Plan:  Heparin :6/27 @ 0153 HL=0.64, therapeutic. CBC stable.  Continue current heparin infusion rate of 1300 units/hr.  Check  HL and CBC daily while on heparin  Warfarin: INR= 3.2 - f/u for warfarin dosing on 6/27  Ena Dawley, PharmD Clinical Pharmacist 06/18/2020 3:51 AM

## 2020-06-18 NOTE — Progress Notes (Signed)
[place] for heparin + warfarin Indication: afib/mechanical mitral valve   Allergies  Allergen Reactions  . Flecainide Shortness Of Breath and Other (See Comments)    Reaction: dizziness   . Amiodarone Other (See Comments)    Pt states that this medication causes lung bleeding.      Patient Measurements: Height: 5\' 9"  (175.3 cm) Weight: 95.5 kg (210 lb 8.6 oz) IBW/kg (Calculated) : 66.2 Heparin Dosing Weight: 84.9 kg  Vital Signs: Temp: 97.4 F (36.3 C) (06/27 0740) Temp Source: Oral (06/27 0740) BP: 126/61 (06/27 0740) Pulse Rate: 77 (06/27 0740)  Labs: Recent Labs    06/16/20 0450 06/16/20 0450 06/17/20 0904 06/17/20 2018 06/18/20 0153  HGB  --   --  9.1*  --  9.1*  HCT  --   --  30.0*  --  30.1*  PLT  --   --  194  --  209  LABPROT 21.8*  --  23.9*  --  31.6*  INR 2.0*  --  2.2*  --  3.2*  HEPARINUNFRC 0.46   < > <0.10* 0.58 0.64   < > = values in this interval not displayed.    Estimated Creatinine Clearance: 62.9 mL/min (by C-G formula based on SCr of 0.98 mg/dL).   Medical History: Past Medical History:  Diagnosis Date  . Acute diastolic heart failure ([place])   . Allergy   . 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 Asthma   . Atrial fibrillation ([place])   . Backache, unspecified   . Cancer ([place])    skin  . Cardiomegaly   . COPD (chronic obstructive pulmonary disease) ([place])   . Diabetes mellitus without complication ([place])   . 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 Esophageal reflux   . Headache(784.0)   . Herpes zoster without mention of complication   . Hx: UTI (urinary tract infection)   . Hypertension    heart controlled w CHF  . Nontoxic uninodular goiter   . Obesity, unspecified   . Osteoarthrosis, unspecified whether generalized or localized, unspecified site   . Unspecified sleep apnea   . Urine incontinence    hx of    Assessment: 74 year old female with afib on warfarin  PTA.  presents with GI bleeding in the setting of supratherapeutic INR, AKI, UTI. 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  Patient also with mechanical mitral valve replacement with goal INR 2.5-3.5. Home regimen 7 mg Mon, Tues, Thurs, Fri and 8 mg Wed, Sat, Sun. Warfarin has been on hold for endoscopic procedure. Pharmacy now consulted to restart warfarin. Patient continues on heparin drip.  Date INR Dose 6/16 4.6 -- 6/17 3.3 -- 6/18 2.7 -- 6/19 1.8 -- 6/20 1.4 8 mg 6/21 1.6 8 mg 6/22 2.3 2 mg 6/23 2.1       5 mg 6/24     2.0       6 mg 6/25     2.0 7.5 mg 6/26 2.2 7.5 mg 6/27 3.2    Goal of Therapy:  Heparin level 0.3-0.7 units/ml  INR 2.5-3.5 Monitor platelets by anticoagulation protocol: Yes   Plan:  Heparin :6/27 @ 0153 HL=0.64, therapeutic. CBC stable.  Continue current heparin infusion rate of 1300 units/hr.  Check HL and CBC daily while on heparin  Warfarin: INR= 3.2 -therapeutic x 1.  Will order 7 mg Warfarin tonight.   Kianah Harries A,  PharmD Clinical Pharmacist 06/18/2020 9:24 AM

## 2020-06-19 LAB — CBC
HCT: 30.8 % — ABNORMAL LOW (ref 36.0–46.0)
Hemoglobin: 9.3 g/dL — ABNORMAL LOW (ref 12.0–15.0)
MCH: 28.4 pg (ref 26.0–34.0)
MCHC: 30.2 g/dL (ref 30.0–36.0)
MCV: 94.2 fL (ref 80.0–100.0)
Platelets: 214 10*3/uL (ref 150–400)
RBC: 3.27 MIL/uL — ABNORMAL LOW (ref 3.87–5.11)
RDW: 15.9 % — ABNORMAL HIGH (ref 11.5–15.5)
WBC: 7.3 10*3/uL (ref 4.0–10.5)
nRBC: 0 % (ref 0.0–0.2)

## 2020-06-19 LAB — GLUCOSE, CAPILLARY
Glucose-Capillary: 106 mg/dL — ABNORMAL HIGH (ref 70–99)
Glucose-Capillary: 128 mg/dL — ABNORMAL HIGH (ref 70–99)

## 2020-06-19 LAB — PROTIME-INR
INR: 5.2 (ref 0.8–1.2)
Prothrombin Time: 46.5 seconds — ABNORMAL HIGH (ref 11.4–15.2)

## 2020-06-19 LAB — HEPARIN LEVEL (UNFRACTIONATED): Heparin Unfractionated: <0.1 IU/mL — ABNORMAL LOW (ref 0.30–0.70)

## 2020-06-19 LAB — SARS CORONAVIRUS 2 BY RT PCR (HOSPITAL ORDER, PERFORMED IN ~~LOC~~ HOSPITAL LAB): SARS Coronavirus 2: NEGATIVE

## 2020-06-19 MED ORDER — BUMETANIDE 1 MG PO TABS
6.0000 mg | ORAL_TABLET | Freq: Two times a day (BID) | ORAL | Status: DC
  Administered 2020-06-19: 6 mg via ORAL
  Filled 2020-06-19 (×3): qty 6

## 2020-06-19 MED ORDER — DICLOFENAC SODIUM 1 % EX GEL: 2.0000 g | Freq: Four times a day (QID) | CUTANEOUS | Status: AC

## 2020-06-19 MED ORDER — PREGABALIN 75 MG PO CAPS: 75.0000 mg | ORAL_CAPSULE | Freq: Every day | ORAL | 0 refills | Status: AC

## 2020-06-19 MED ORDER — ROPINIROLE HCL 3 MG PO TABS: 3.0000 mg | ORAL_TABLET | Freq: Three times a day (TID) | ORAL | 0 refills | Status: AC

## 2020-06-19 NOTE — Plan of Care (Signed)
  Problem: Education: Goal: Knowledge of General Education information will improve Description Including pain rating scale, medication(s)/side effects and non-pharmacologic comfort measures Outcome: Progressing   

## 2020-06-19 NOTE — Discharge Summary (Signed)
Physician Discharge Summary  Betty [name] DTO:[id number] DOB: May 02, 1946 DOA: 06/07/2020  PCP: Dion Body, MD  Admit date: 06/07/2020 Discharge date: 06/19/2020  Admitted From: Home Disposition: Skilled nursing facility  Recommendations for Outpatient Follow-up:  1. Follow up with PCP in 1-2 weeks INR check on 6/29, 6/30 and resume Coumadin 5 mg daily    Discharge Condition: Stable CODE STATUS: Full code Diet recommendation: Low-salt and low-carb diet  Discharge summary: 74 year old female with history of diastolic dysfunction, ejection fraction 55%, interstitial lung disease, paroxysmal atrial fibrillation, history of major depression, diabetes on oral hypoglycemics, ANCA vasculitis, PE, history of mitral valve repair on chronic anticoagulation therapy with Coumadin, pacemaker in place, stage IV chronic kidney disease and history of sleep apnea on BiPAP was admitted to the intensive care unit on 06/07/2020 with dark stools at home.  She was found with blood pressure 80/49, INR 4.6 hemoglobin 6.1 in the ER.  Urine consistent with UTI.  COVID-19 negative. Blood cultures 6/16, negative Urine culture 6/16, Klebsiella sensitive to ceftriaxone. 6/20, EGD showing multiple bleeding angiectasia in the stomach.  Developed some delirium postoperative on 6/20 that is improved.  Treated for multiple medical issues as below.  Further plan of care as below.  #1 symptomatic anemia, acute GI bleeding in the setting of Coumadin use: Received total 2 units of PRBC transfusion, hemoglobin is stable since then. Underwent EGD on 6/20, found to have telangiectasia in the stomach treated with APC.  Currently on oral Protonix. Hemoglobin has been stable since last 1 week and tolerating anticoagulation without problem.  #2 altered mental status: Likely postop delirium.  Improved and normalized.  #3 hypotension in the setting of hemorrhagic shock: Improved.  Bumex, beta-blockers and aspirin, Aldactone  resumed and tolerating well.   #4 acute UTI present on admission: Klebsiella pneumonia.  Does have history of ESBL. Treated with Rocephin for 7 days.  Finished antibiotic therapy.  #5 history of COPD and asthma without acute exacerbation, obstructive sleep apnea: On oxygen supplementation at home, continued.  Keep on oxygen to keep sats more than 88%.  As needed bronchodilators. BiPAP at night at home settings.  Tolerating well.  #6 type 2 diabetes:  Controlled.  Resume oral hypoglycemics from home.    #7 mechanical mitral valve on Coumadin:  INR 2.2-3.2-5.2 today.  She was bridged with heparin.  No active bleeding. No Coumadin 6/28, 6/29 Recheck INR 6/29, 6/30.  Please recheck hemoglobin especially given drop in hemoglobin with high INR. Resume Coumadin 5 mg daily on 6/30 evening and continue to very closely follow-up INR checks.  #8. Anxiety and depression: Resume home medications including SSRI.  She is also on Lyrica for neuropathy that we will continue.    Patient stated she is on gabapentin.  Could not find any records or pharmacy dispensing gabapentin.  She will continue Lyrica and Requip.  She will use local Voltaren gel for pain on her neck or other body area.  Chronically sick with multiple medical issues.  She will benefit with inpatient therapies at a skilled nursing facility before returning home with independent living.  Multiple calls to patient's son on the chart unable to reach. Call to update patient's son with the number on the chart, 06/19/2020 11:22 AM.  Unable to reach or leave a voicemail.  Discharge Diagnoses:  Active Problems:   Afib ([place])   Shock [facility])    Discharge Instructions  Discharge Instructions    Diet - low sodium heart healthy   Complete by: As  directed    Diet Carb Modified   Complete by: As directed    Discharge instructions   Complete by: As directed    Repeat blood test and Coumadin orders and details on discharge summary.   Increase  activity slowly   Complete by: As directed      Allergies as of 06/19/2020      Reactions   Flecainide Shortness Of Breath, Other (See Comments)   Reaction: dizziness    Amiodarone Other (See Comments)   Pt states that this medication causes lung bleeding.        Medication List    STOP taking these medications   warfarin 3 MG tablet Commonly known as: COUMADIN   warfarin 4 MG tablet Commonly known as: COUMADIN     TAKE these medications   atorvastatin 20 MG tablet Commonly known as: LIPITOR Take 20 mg by mouth at bedtime.   azaTHIOprine 50 MG tablet Commonly known as: IMURAN Take 2 tablets by mouth daily.   bumetanide 2 MG tablet Commonly known as: BUMEX Take 6 mg by mouth 2 (two) times daily.   CULTURELLE ADULT ULT BALANCE PO Take by mouth.   cyclobenzaprine 10 MG tablet Commonly known as: FLEXERIL Take 10 mg by mouth 3 (three) times daily as needed.   diclofenac Sodium 1 % Gel Commonly known as: VOLTAREN Apply 2 g topically 4 (four) times daily.   ferrous sulfate 325 (65 FE) MG EC tablet Take 325 mg by mouth daily.   glipiZIDE 5 MG 24 hr tablet Commonly known as: GLUCOTROL XL Take 1 tablet by mouth daily.   levocetirizine 5 MG tablet Commonly known as: XYZAL Take 5 mg by mouth at bedtime.   magnesium oxide 400 MG tablet Commonly known as: MAG-OX Take 1 tablet by mouth daily.   metFORMIN 1000 MG tablet Commonly known as: GLUCOPHAGE Take 1 tablet by mouth 2 (two) times daily.   metolazone 2.5 MG tablet Commonly known as: ZAROXOLYN as needed (fluid retention). :TAKE 1 TABLET BY MOUTH EVERY MONDAY, WEDNESDAY AND FRIDAY 30 MINUTES PRIOR TO MORNING BUMEX   metoprolol succinate 25 MG 24 hr tablet Commonly known as: TOPROL-XL Take 25 mg by mouth daily. TAKE 1 TABLET BY MOUTH DAILY   pantoprazole 40 MG tablet Commonly known as: PROTONIX Take 40 mg by mouth daily.   pregabalin 75 MG capsule Commonly known as: LYRICA Take 1 capsule (75 mg total)  by mouth daily.   rOPINIRole 3 MG tablet Commonly known as: REQUIP Take 1 tablet (3 mg total) by mouth 3 (three) times daily. TAKE THREE TABLETS 3 TIMES DAILY What changed:   medication strength  when to take this   senna-docusate 8.6-50 MG tablet Commonly known as: Senokot-S Take 1 tablet by mouth 2 (two) times daily.   spironolactone 50 MG tablet Commonly known as: ALDACTONE Take 50 mg by mouth daily.   traZODone 50 MG tablet Commonly known as: DESYREL Take 50 mg by mouth at bedtime.   venlafaxine XR 37.5 MG 24 hr capsule Commonly known as: EFFEXOR-XR TAKE 1 CAPSULE BY MOUTH ONCE DAILY TAKE WITH 150MG  TO EQUAL 187.5MG  DAILY   venlafaxine XR 150 MG 24 hr capsule Commonly known as: EFFEXOR-XR daily. TAKE 1 CAPSULE BY MOUTH ONCE DAILY TAKE WITH 37.5MG  TO EQUAL 187.5MG  DAILY       Contact information for after-discharge care    Destination    [facility] .   Service: Skilled Chiropodist information: [place]  [place] [phone]                 Allergies  Allergen Reactions  . Flecainide Shortness Of Breath and Other (See Comments)    Reaction: dizziness   . Amiodarone Other (See Comments)    Pt states that this medication causes lung bleeding.      Consultations:  Cardiology  GI   Procedures/Studies: CT HEAD WO CONTRAST  Result Date: 06/11/2020 CLINICAL DATA:  Altered mental status, unclear cause. EXAM: CT HEAD WITHOUT CONTRAST TECHNIQUE: Contiguous axial images were obtained from the base of the skull through the vertex without intravenous contrast. COMPARISON:  08/05/2019 FINDINGS: The study is motion degraded including severe motion near the skull base. Brain: Within limitations of motion, no acute large territory infarct, intracranial hemorrhage, mass, midline shift, or extra-axial fluid collection is identified. 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  Hypodensities in the cerebral white matter are grossly similar to the prior study and nonspecific but compatible with mild chronic small vessel ischemic disease. The ventricles and sulci are within normal limits for age. Vascular: Calcified atherosclerosis at the skull base. Skull: No gross fracture or destructive osseous lesion. Sinuses/Orbits: Visualized paranasal sinuses and mastoid air cells are clear. Bilateral cataract extraction. Other: None. IMPRESSION: 1. Motion degraded examination without evidence of acute intracranial abnormality. 2. Mild chronic small vessel ischemic disease. Electronically Signed   By: Logan Bores M.D.   On: 06/11/2020 17:30   DG Chest Port 1 View  Result Date: 06/12/2020 CLINICAL DATA:  Abnormal chest radiograph. Recent hospital admission for shock, gastrointestinal hemorrhage. EXAM: PORTABLE CHEST 1 VIEW COMPARISON:  06/08/2020 and CT chest 04/02/2016. FINDINGS: Patient is rotated. Cardiopericardial silhouette is enlarged, stable. Thoracic aorta is calcified. There is worsening mixed interstitial and airspace opacification, basilar predominant, with bilateral pleural effusions. Right apical pleural thickening. 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 IMPRESSION: 1. Worsening mixed interstitial and airspace opacification, basilar dependent, with bilateral pleural effusions. Findings likely reflect congestive heart failure. Difficult to exclude pneumonia. 2. Question underlying chronic lung disease. Electronically Signed   By: Lorin Picket M.D.   On: 06/12/2020 11:48   DG Chest Port 1 View  Result Date: 06/08/2020 CLINICAL DATA:  Shortness of breath, interval changes EXAM: PORTABLE CHEST 1 VIEW COMPARISON:  August 05, 2019 FINDINGS: The heart size and mediastinal contours are unchanged with cardiomegaly. A left-sided pacemaker is again noted. Prosthetic mitral valve and overlying median sternotomy wires are present. There is diffusely increased interstitial markings seen throughout  both lungs as on the prior exam. There is also prominence of the central pulmonary vasculature. IMPRESSION: Cardiomegaly and pulmonary vascular congestion. Diffusely increased interstitial markings seen throughout both lungs, likely due to chronic lung changes. Electronically Signed   By: Prudencio Pair M.D.   On: 06/08/2020 04:21    (Echo, Carotid, EGD, Colonoscopy, ERCP)    Subjective: Patient seen and examined.  No overnight events.  Excited to go to rehab.  She thinks he needs all of her diuretics.   Discharge Exam: Vitals:   06/19/20 0028 06/19/20 0810  BP: (!) 90/51 113/71  Pulse: 65 70  Resp:  16  Temp:  (!) 97.3 F (36.3 C)  SpO2:  99%   Vitals:   06/19/20 0003 06/19/20 0028 06/19/20 0441 06/19/20 0810  BP: (!) 83/56 (!) 90/51  113/71  Pulse: 62 65  70  Resp: 20   16  Temp: 98.4 F (36.9 C)   (!) 97.3 F (36.3 C)  TempSrc:    Oral  SpO2: 93%   99%  Weight:   104.3 kg   Height:        General: Pt is alert, awake, not in acute distress, on 2 L oxygen.  Fairly comfortable sitting in couch. Cardiovascular: RRR, S1/S2 +, no rubs, no gallops, mechanical heart sound present.  Trace bilateral pedal edema. Respiratory: CTA bilaterally, no wheezing, no rhonchi Abdominal: Soft, NT, ND, bowel sounds + Extremities: Trace bilateral pedal edema.    The results of significant diagnostics from this hospitalization (including imaging, microbiology, ancillary and laboratory) are listed below for reference.     Microbiology: No results found for this or any previous visit (from the past 240 hour(s)).   Labs: BNP (last 3 results) Recent Labs    06/11/20 1602  BNP 374.8*   Basic Metabolic Panel: Recent Labs  Lab 06/13/20 0856  NA 141  K 3.9  CL 105  CO2 28  GLUCOSE 140*  BUN 28*  CREATININE 0.98  CALCIUM 8.4*  MG 1.9  PHOS 3.0   Liver Function Tests: No results for input(s): AST, ALT, ALKPHOS, BILITOT, PROT, ALBUMIN in the last 168 hours. No results for  input(s): LIPASE, AMYLASE in the last 168 hours. No results for input(s): AMMONIA in the last 168 hours. CBC: Recent Labs  Lab 06/14/20 0629 06/15/20 0511 06/17/20 0904 06/18/20 0153 06/19/20 0554  WBC 8.4 7.4 7.2 6.4 7.3  HGB 9.1* 9.4* 9.1* 9.1* 9.3*  HCT 28.7* 30.7* 30.0* 30.1* 30.8*  MCV 92.9 93.6 94.9 93.2 94.2  PLT 211 221 194 209 214   Cardiac Enzymes: No results for input(s): CKTOTAL, CKMB, CKMBINDEX, TROPONINI in the last 168 hours. BNP: Invalid input(s): POCBNP CBG: Recent Labs  Lab 06/18/20 0740 06/18/20 1143 06/18/20 1719 06/18/20 2142 06/19/20 0814  GLUCAP 121* 135* 140* 122* 106*   D-Dimer No results for input(s): DDIMER in the last 72 hours. Hgb A1c No results for input(s): HGBA1C in the last 72 hours. Lipid Profile No results for input(s): CHOL, HDL, LDLCALC, TRIG, CHOLHDL, LDLDIRECT in the last 72 hours. Thyroid function studies No results for input(s): TSH, T4TOTAL, T3FREE, THYROIDAB in the last 72 hours.  Invalid input(s): FREET3 Anemia work up No results for input(s): VITAMINB12, FOLATE, FERRITIN, TIBC, IRON, RETICCTPCT in the last 72 hours. Urinalysis    Component Value Date/Time   COLORURINE YELLOW (A) 06/07/2020 1722   APPEARANCEUR CLOUDY (A) 06/07/2020 1722   APPEARANCEUR Hazy (A) 11/22/2019 1510   LABSPEC 1.014 06/07/2020 1722   PHURINE 5.0 06/07/2020 1722   GLUCOSEU NEGATIVE 06/07/2020 1722   GLUCOSEU NEGATIVE 05/03/2014 0931   HGBUR MODERATE (A) 06/07/2020 1722   BILIRUBINUR NEGATIVE 06/07/2020 1722   BILIRUBINUR Negative 11/22/2019 1510   KETONESUR NEGATIVE 06/07/2020 1722   PROTEINUR NEGATIVE 06/07/2020 1722   UROBILINOGEN 1.0 01/20/2015 1500   UROBILINOGEN 0.2 05/03/2014 0931   NITRITE NEGATIVE 06/07/2020 1722   LEUKOCYTESUR MODERATE (A) 06/07/2020 1722   Sepsis Labs Invalid input(s): PROCALCITONIN,  WBC,  LACTICIDVEN Microbiology No results found for this or any previous visit (from the past 240 hour(s)).   Time  coordinating discharge:  45 minutes  SIGNED:   Barb Merino, MD  [facility] 06/19/2020, 11:13 AM

## 2020-06-19 NOTE — Progress Notes (Addendum)
[place] for heparin + warfarin Indication: afib/mechanical mitral valve   Allergies  Allergen Reactions  . Flecainide Shortness Of Breath and Other (See Comments)    Reaction: dizziness   . Amiodarone Other (See Comments)    Pt states that this medication causes lung bleeding.      Patient Measurements: Height: 5\' 9"  (175.3 cm) Weight: 104.3 kg (230 lb) IBW/kg (Calculated) : 66.2 Heparin Dosing Weight: 84.9 kg  Vital Signs: Temp: 97.3 F (36.3 C) (06/28 0810) Temp Source: Oral (06/28 0810) BP: 113/71 (06/28 0810) Pulse Rate: 70 (06/28 0810)  Labs: Recent Labs    06/17/20 0904 06/17/20 0904 06/17/20 2018 06/18/20 0153 06/19/20 0554  HGB 9.1*   < >  --  9.1* 9.3*  HCT 30.0*  --   --  30.1* 30.8*  PLT 194  --   --  209 214  LABPROT 23.9*  --   --  31.6* 46.5*  INR 2.2*  --   --  3.2* 5.2*  HEPARINUNFRC <0.10*   < > 0.58 0.64 <0.10*   < > = values in this interval not displayed.    Estimated Creatinine Clearance: 65.7 mL/min (by C-G formula based on SCr of 0.98 mg/dL).   Medical History: Past Medical History:  Diagnosis Date  . Acute diastolic heart failure ([place])   . Allergy   . ANCA-associated vasculitis ([place])   . Asthma   . Atrial fibrillation ([place])   . Backache, unspecified   . Cancer ([place])    skin  . Cardiomegaly   . COPD (chronic obstructive pulmonary disease) ([place])   . Diabetes mellitus without complication ([place])   . 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 Esophageal reflux   . Headache(784.0)   . Herpes zoster without mention of complication   . Hx: UTI (urinary tract infection)   . Hypertension    heart controlled w CHF  . Nontoxic uninodular goiter   . Obesity, unspecified   . Osteoarthrosis, unspecified whether generalized or localized, unspecified site   . Unspecified sleep apnea   . Urine incontinence    hx of    Assessment: 74 year old female with afib on warfarin  PTA.  presents with GI bleeding in the setting of supratherapeutic INR, AKI, UTI. 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  Patient also with mechanical mitral valve replacement with goal INR 2.5-3.5. Home regimen 7 mg Mon, Tues, Thurs, Fri and 8 mg Wed, Sat, Sun. Warfarin has been on hold for endoscopic procedure. Pharmacy now consulted to restart warfarin. Patient off heparin drip since 6/27.   Date INR Dose 6/16 4.6 -- 6/17 3.3 -- 6/18 2.7 -- 6/19 1.8 -- 6/20 1.4 8 mg 6/21 1.6 8 mg 6/22 2.3 2 mg 6/23 2.1       5 mg 6/24     2.0       6 mg 6/25     2.0 7.5 mg 6/26 2.2 7.5 mg 6/27 3.2 7 mg 6/28 5.2 Hold warfarin   Goal of Therapy:  INR 2.5-3.5 Monitor platelets by anticoagulation protocol: Yes   Plan:  Heparin : Drip discontinued 6/27  Warfarin:  -INR= 5.2 , supratherapeutic. Will hold warfarin tonight. If patient is discharged today and un-able to monitor INR would hold dose 6/29 + 6/30 as well. Would then resume warfarin 7/1 at lower dose of 5 mg/day with close outpatient follow-up -  Patient was supratherapeutic to 4.6 on admission on total weekly dose of 52 mg (average 7.4 mg/day) -Patient has received 51 mg last 8 days (average 6.4 mg/day) and INR increased from 3.2 to 5.2 overnight -H&H, platelets stable -Re-check INR + CBC tomorrow AM  [place] Resident 06/19/2020 8:14 AM

## 2020-06-19 NOTE — TOC Progression Note (Signed)
Transition of Care [facility]) - Progression Note    Patient Details  Name: Betty [name] MRN: [id number] Date of Birth: 1946/09/30  [facility]) CM/SW [place], RN Phone Number: 06/19/2020, 9:05 AM  Clinical Narrative:     Received notification from [facility] that auth is approved and she is able to come when medically ready I notified the physician      Expected Discharge Plan and Services                                                 Social Determinants of Health (SDOH) Interventions    Readmission Risk Interventions No flowsheet data found.

## 2020-06-19 NOTE — TOC Progression Note (Signed)
Transition of Care [facility]) - Progression Note    Patient Details  Name: Betty [name] MRN: [id number] Date of Birth: 09-03-1946  Transition of Care [facility]) CM/SW [place], RN Phone Number: 06/19/2020, 3:39 PM  Clinical Narrative:     The patient is to DC to [facility] today She will notify her son herself  The bedside nurse has called report and RNCM called [facility] transport       Expected Discharge Plan and Services           Expected Discharge Date: 06/19/20                                     Social Determinants of Health (SDOH) Interventions    Readmission Risk Interventions No flowsheet data found.

## 2020-06-19 NOTE — Plan of Care (Signed)
Criteria for d/c Plan to discharge once covid tests return. Uneventful shift will continue to monitor

## 2020-08-15 ENCOUNTER — Encounter: Payer: Self-pay | Admitting: Internal Medicine

## 2020-08-15 ENCOUNTER — Other Ambulatory Visit (HOSPITAL_COMMUNITY): Payer: Medicare PPO | Admitting: Internal Medicine

## 2020-08-15 DIAGNOSIS — I2721 Secondary pulmonary arterial hypertension: Secondary | ICD-10-CM

## 2020-08-15 DIAGNOSIS — I480 Paroxysmal atrial fibrillation: Secondary | ICD-10-CM | POA: Insufficient documentation

## 2020-08-15 DIAGNOSIS — J9621 Acute and chronic respiratory failure with hypoxia: Secondary | ICD-10-CM

## 2020-08-15 DIAGNOSIS — A419 Sepsis, unspecified organism: Secondary | ICD-10-CM

## 2020-08-15 DIAGNOSIS — I776 Arteritis, unspecified: Secondary | ICD-10-CM

## 2020-08-15 DIAGNOSIS — R6520 Severe sepsis without septic shock: Secondary | ICD-10-CM

## 2020-08-15 DIAGNOSIS — I7782 Antineutrophilic cytoplasmic antibody (ANCA) vasculitis: Secondary | ICD-10-CM

## 2020-08-15 DIAGNOSIS — J8410 Pulmonary fibrosis, unspecified: Secondary | ICD-10-CM

## 2020-08-15 NOTE — Progress Notes (Signed)
[facility]  [facility] PULMONARY SERVICE  Date of Service: 08/15/2020  PULMONARY CONSULT   NAILAH LUEPKE  TQI:[id number]  DOB: [birth year]     Referring Physician: Larena Glassman, MD  HPI: Betty [name] is a 74 y.o. female seen for Acute on Chronic Respiratory Failure.  Patient has multiple medical problems including severe sepsis ANCA vasculitis respiratory failure pulmonary hypertension interstitial lung disease chronic heart failure tricuspid regurgitation presented to the hospital with an upper GI bleed.  Patient also was found to have a urinary tract infection was treated with antibiotics.  Apparently patient suffered a fall and EMS was called at home she initially declined transportation but then son went to take a look at her found her confused EMS was called again and patient ended up at [facility] ED where she became hypotensive.  Patient was thought to be septic and was started on fluid and also on pressors.  Admitted to the hospital requiring pressors was treated with antibiotics kept on the ventilator.  Patient had a lot of issues with any hemodynamic instability.  She ended up having to have a tracheostomy because of the ongoing necessity of mechanical ventilation.  Eventually transferred to [place] for further management and weaning  Review of Systems:  ROS performed and is unremarkable other than noted above.  Past Medical History:  Diagnosis Date  . Acute diastolic heart failure (HCC)   . Allergy   . ANCA-associated vasculitis (HCC)   . Asthma   . Atrial fibrillation (HCC)   . Backache, unspecified   . Cancer (HCC)    skin  . Cardiomegaly   . COPD (chronic obstructive pulmonary disease) (HCC)   . Diabetes mellitus without complication (HCC)   . 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 Esophageal reflux   . Headache(784.0)   . Herpes zoster without mention of complication   . Hx: UTI (urinary tract infection)   . Hypertension    heart controlled  w CHF  . Nontoxic uninodular goiter   . Obesity, unspecified   . Osteoarthrosis, unspecified whether generalized or localized, unspecified site   . Unspecified sleep apnea   . Urine incontinence    hx of    Past Surgical History:  Procedure Laterality Date  . ABDOMINAL HYSTERECTOMY  1979   complete (for precancerous cells)  . ABLATION  2011 & 2014  . bladder botox  01/26/2018  . CHOLECYSTECTOMY    . CYSTOSCOPY WITH FULGERATION N/A 01/28/2018   Procedure: CYSTOSCOPY WITH FULGERATION AND CLOT EVACUATION;  Surgeon: Vanna Scotland, MD;  Location: [facility] ORS;  Service: Urology;  Laterality: N/A;  . ESOPHAGOGASTRODUODENOSCOPY N/A 06/11/2020   Procedure: ESOPHAGOGASTRODUODENOSCOPY (EGD);  Surgeon: Toledo, Boykin Nearing, MD;  Location: [facility] ENDOSCOPY;  Service: Gastroenterology;  Laterality: N/A;  . Interstim Placement  2012  . OOPHORECTOMY      Social History:    reports that she has quit smoking. Her smoking use included cigarettes. She has a 7.50 pack-year smoking history. She has never used smokeless tobacco. She reports that she does not drink alcohol and does not use drugs.  Family History: Non-Contributory to the present illness  Allergies  Reviewed on the [facility]  Medications: Reviewed on Rounds  Physical Exam:  Vitals: Temperature 97.3 pulse 93 respiratory 18 blood pressure is 110/70 saturations 99%  Ventilator Settings on pressure support FiO2 is 40% pressure 14/8 tidal volume is 393  . General: Comfortable at this time . Eyes: Grossly normal lids, irises &  conjunctiva . ENT: grossly tongue is normal . Neck: no obvious mass . Cardiovascular: S1-S2 normal no gallop or rub . Respiratory: Coarse breath sounds with few scattered rhonchi . Abdomen: Soft nontender . Skin: no rash seen on limited exam . Musculoskeletal: not rigid . Psychiatric:unable to assess . Neurologic: no seizure no involuntary movements         Labs on Admission:  *Comprehensive Metabolic Panel  (CMP) Specimen:  Blood  Ref Range & Units Today Comments  Sodium 135 - 145 mmol/L 144        Potassium 3.5 - 5.0 mmol/L 4.6        Chloride 98 - 108 mmol/L 104        Carbon Dioxide (CO2) 21 - 30 mmol/L 32High        Urea Nitrogen (BUN) 7 - 20 mg/dL 36High        Creatinine 0.4 - 1.0 mg/dL 0.7        Glucose 70 - 140 mg/dL 163High  Interpretive Data:  Above is the NONFASTING reference range.   Below are the FASTING reference ranges:  NORMAL:   70-99 mg/dL  PREDIABETES: 100-125 mg/dL  DIABETES:  > 125 mg/dL       Calcium 8.7 - 10.2 mg/dL 8.0Low        AST (Aspartate Aminotransferase) 15 - 41 U/L 32        ALT (Alanine Aminotransferase) 14 - 54 U/L 22        Bilirubin, Total 0.4 - 1.5 mg/dL 0.7        Alk Phos (Alkaline Phosphatase) 24 - 110 U/L 175High        Albumin 3.5 - 4.8 g/dL 1.6Low        Protein, Total 6.2 - 8.1 g/dL 5.6Low        Anion Gap 3 - 12 mmol/L 8        BUN/CREA Ratio 6 - 27  50High        Glomerular Filtration Rate (eGFR)  mL/min/1.73sq m 86   NON-Modified eGFR: 86 mL/min/1.73sq m .  We recommend using this value for referral decisions.   Modified eGFR : 100 mL/min/1.73sq m .  This eGFR estimates kidney function using the CKD-Epi equation that increases eGFR by 16% for patients identified as Black/African-American in the medical record. This value was previously reported as the single eGFR before      Complete Blood Count (CBC) with Differential Specimen:  Blood  Ref Range & Units Today  WBC (White Blood Cell Count) 3.2 - 9.8 x10^9/L 6.9      Hemoglobin 12.0 - 15.5 g/dL 7.1Low      Hematocrit 35.0 - 45.0 % 25.3Low      Platelets 150 - 450 x10^9/L 159      MCV (Mean Corpuscular Volume) 80 - 98 fL 101High      MCH (Mean Corpuscular Hemoglobin) 26.5 - 34.0 pg 28.3      MCHC (Mean Corpuscular Hemoglobin Concentration) 31.4 - 36.0 % 28.1Low      RBC (Red Blood Cell Count) 3.77 - 5.16 x10^12/L 2.51Low       RDW-CV (Red Cell Distribution Width) 11.5 - 14.5 % 23.9High      NRBC (Nucleated Red Blood Cell Count) 0 x10^9/L 0.00      NRBC % (Nucleated Red Blood Cell %) % 0.0      MPV (Mean Platelet Volume) 7.2 - 11.7 fL 10.9      Neutrophil Count 2.0 - 8.6 x10^9/L 5.4  Neutrophil % 37 - 80 % 79.0      Lymphocyte Count 0.6 - 4.2 x10^9/L 0.5Low      Lymphocyte % 10 - 50 % 6.7Low      Monocyte Count 0 - 0.9 x10^9/L 0.7      Monocyte % 0 - 12 % 10.8      Eosinophil Count 0 - 0.70 x10^9/L 0.14      Eosinophil % 0 - 7 % 2.0      Basophil Count 0 - 0.20 x10^9/L 0.02      Basophil % 0 - 2 % 0.3      Immature Granulocyte Count <=0.06 x10^9/L 0.08High      Immature Granulocyte % <=0.7 % 1.2High      Resulting Agency  [facility]  Specimen Collected: 08/15/20 3:50 AM Last Resulted: 08/15/20 4:02 AM  Received From: [place]  Result Received: 08/15/20 8:30 PM      Radiological Exams on Admission: *XR CHEST SINGLE VIEW PORTABLE  XR ABDOMEN AP PORTABLE   Indication: Other ( add clinical information to comment box below)   Comparison: Chest radiograph August 13, 2020.   Findings/ Impression:   Chest:  Unchanged support apparatus.  Cardiomediastinal contours are stable.  Bilateral layering small pleural effusions with adjacent atelectasis,  similar to prior. Moderate pulmonary edema. Overall aeration of the lungs  is not significantly changed from most recent radiograph. No evidence of  pneumothorax.  No acute osseous abnormality. Median sternotomy closure wires.   Abdomen:  A gastrostomy tube projects over the left hemiabdomen.  Mild gaseous distention of multiple loops of small bowel, which could  reflect an adynamic ileus. No evidence of high-grade obstruction.  Limited evaluation for free intra-abdominal air on this supine view.  No gross organomegaly.  No acute osseous abnormality.  Spinal stimulator projects over  the right hemipelvis.   Electronically Signed by: Neta Ehlers, MD, [facility] Radiology  Electronically Signed on: 08/14/2020 4:41 PM   Assessment/Plan Patient Active Problem List   Diagnosis Date Noted  . Shock ([place]) 06/07/2020  . Gait instability 08/06/2019  . Severe sepsis ([place]) 12/25/2018  . Abdominal pain   . Goals of care, counseling/discussion   . Palliative care by specialist   . Respiratory failure, acute ([place]) 02/26/2018  . COPD (chronic obstructive pulmonary disease) ([place]) 02/26/2018  . C2 cervical fracture ([place]) 02/19/2018  . Hematuria 01/27/2018  . Carotid stenosis 12/23/2017  . Varicose veins of both lower extremities with pain 11/17/2017  . Acute on chronic diastolic CHF (congestive heart failure) ([place]) 08/13/2017  . Leg pain 07/14/2017  . Chronic venous insufficiency 07/14/2017  . PAD (peripheral artery disease) ([place]) 07/14/2017  . Postprocedural hemorrhage due to complication of oral surgery 03/27/2017  . Acute blood loss anemia 03/27/2017  . H/O mitral valve replacement with mechanical valve 03/27/2017  . Atypical atrial flutter ([place]) 01/23/2017  . Long term current use of anticoagulants with INR goal of 2.5-3.5 09/18/2016  . Syncope 06/25/2016  . UTI (urinary tract infection) 06/25/2016  . Afib ([place]) 05/10/2016  . Dyspnea 05/10/2016  . Chronic diastolic CHF (congestive heart failure) ([place]) 05/10/2016  . Anemia 05/10/2016  . Anemia 05/10/2016  . Other specified abnormal immunological findings in serum 04/24/2016  . Sepsis ([place]) 04/01/2016  . Prolonged Q-T interval on ECG 03/13/2016  . Mitral stenosis 03/13/2016  . Interstitial lung disease ([place]) 12/29/2015  . Chronic LBP 07/26/2015  . Essential (primary) hypertension 07/26/2015  . Idiopathic insomnia 07/26/2015  .  Restless leg 07/26/2015  . Obesity (BMI 30-39.9) 01/07/2015  . Abnormal EKG 09/11/2014  . External nasal lesion 02/26/2014  . Abnormal liver function tests 01/28/2014  . Ankle pain, right  01/28/2014  . Arthralgia of ankle or foot 01/28/2014  . Urinary frequency 12/24/2013  . Hemorrhoids 12/24/2013  . Neck pain on right side 11/10/2013  . Vitamin D deficiency 09/09/2013  . Urinary incontinence 05/30/2013  . History of colonic polyps 05/30/2013  . Controlled type 2 diabetes mellitus without complication ([place]) 16/09/9603  . H/O deep venous thrombosis 05/05/2013  . History of pulmonary embolism 05/05/2013  . 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 OSA (obstructive sleep apnea) 05/05/2013  . Depression 04/13/2009  . THYROID NODULE 04/13/2009  . 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 GERD 04/13/2009  . OSTEOARTHRITIS 04/13/2009  . BACK PAIN 04/13/2009  . HEADACHE 04/13/2009  . Gastro-esophageal reflux disease without esophagitis 04/13/2009  . Mild episode of recurrent major depressive disorder ([place]) 04/13/2009  . Benign hypertensive heart disease with heart failure ([place]) 03/28/2009  . PAF (paroxysmal atrial fibrillation) ([place]) 03/28/2009  . VENTRICULAR HYPERTROPHY, LEFT 03/28/2009  . Hypertensive cardiomyopathy, with heart failure ([place]) 03/28/2009     1. Acute on chronic respiratory failure with hypoxia she is now weaning on pressure support currently is on 40% FiO2 will titrate down as tolerated only requiring the pressure support with good tidal volumes. 2. Paroxysmal atrial fibrillation right now the rate is controlled on telemetry 3. Pulmonary fibrosis patient has a history of ANCA vasculitis we will continue with supportive care 4. Severe sepsis this is resolved patient has been requiring pressors we will continue to monitor closely. 5. Pulmonary hypertension will need follow-up after discharge.  I have personally seen and evaluated the patient, evaluated laboratory and imaging results, formulated the assessment and plan and placed orders. The Patient requires high complexity decision making with multiple systems involvement.  Case was discussed on  Rounds with the Respiratory Therapy Staff Time Spent 46minutes  Allyne Gee, MD [facility]

## 2020-08-16 ENCOUNTER — Encounter: Payer: Self-pay | Admitting: Internal Medicine

## 2020-08-16 ENCOUNTER — Other Ambulatory Visit (HOSPITAL_COMMUNITY): Payer: Medicare PPO | Admitting: Internal Medicine

## 2020-08-16 DIAGNOSIS — I2721 Secondary pulmonary arterial hypertension: Secondary | ICD-10-CM | POA: Diagnosis not present

## 2020-08-16 DIAGNOSIS — J449 Chronic obstructive pulmonary disease, unspecified: Secondary | ICD-10-CM

## 2020-08-16 DIAGNOSIS — I480 Paroxysmal atrial fibrillation: Secondary | ICD-10-CM | POA: Diagnosis not present

## 2020-08-16 DIAGNOSIS — J8410 Pulmonary fibrosis, unspecified: Secondary | ICD-10-CM

## 2020-08-16 DIAGNOSIS — J9621 Acute and chronic respiratory failure with hypoxia: Secondary | ICD-10-CM

## 2020-08-16 NOTE — Progress Notes (Signed)
[place] NOTE  PULMONARY SERVICE ROUNDS  Date of Service: 08/16/2020  LATANGA NEDROW  DOB: [birth year]  Referring physician: Deanne Coffer, MD  HPI: GLENISHA GUNDRY is a 74 y.o. female  being seen for Acute on Chronic Respiratory Failure.  Patient is pressure support now has been having issues with secretions.  The patient did have an RSB I done which was 138  Review of Systems: Unremarkable other than noted in HPI  Allergies:  Reviewed on the [facility]  Medications: Reviewed  Vitals: Temperature 97.2 pulse 99 respiratory rate 22 blood pressure is 122/78 saturations 98%  Ventilator Settings: On pressure support FiO2 35% pressure 12/5  Physical Exam: . General:  calm and comfortable NAD . Eyes: normal lids, irises & conjunctiva . ENT: grossly normal tongue not enlarged . Neck: no masses . Cardiovascular: S1 S2 Normal no rubs no gallop . Respiratory: No rhonchi very coarse breath sounds are noted . Abdomen: soft non-distended . Skin: no rash seen on limited exam . Musculoskeletal:  no rigidity . Psychiatric: unable to assess . Neurologic: no involuntary movements          Lab Data and radiological Data:  White count 7.5 hemoglobin 7.1 hematocrit 25.7 platelet count 153   Assessment/Plan  Patient Active Problem List   Diagnosis Date Noted  . Paroxysmal atrial fibrillation (HCC)   . Acute on chronic respiratory failure with hypoxia ([place])   . Pulmonary fibrosis ([place])   . Pulmonary arterial hypertension ([place])   . COPD (chronic obstructive pulmonary disease) ([place]) 02/26/2018      1. Acute on chronic respiratory failure with hypoxia patient is still having issues with secretions.  The RSB a lot better so respiratory therapy is going to try to wean. 2. Pulmonary fibrosis supportive care appears to be advanced disease with history of ANCA vasculitis. 3. Severe sepsis resolved by now hemodynamically stable we will continue to monitor. 4. Paroxysmal  atrial fibrillation rate is controlled we will continue to follow along. 5. Severe COPD at baseline we will continue present management   I have personally evaluated the patient, evaluated the laboratory and imaging results and formulated the assessment and plan and placed orders as needed. The Patient requires high complexity decision making with multiple system involvement. Rounds were done with the Respiratory Therapy Director and respiratory therapist involved in the care of the patient as well as nursing staff.   Allyne Gee, MD [facility] Pulmonary Critical Care Medicine   This note is for inpatient care

## 2020-08-17 ENCOUNTER — Other Ambulatory Visit (HOSPITAL_COMMUNITY): Payer: Medicare PPO | Admitting: Internal Medicine

## 2020-08-17 DIAGNOSIS — I2721 Secondary pulmonary arterial hypertension: Secondary | ICD-10-CM | POA: Diagnosis not present

## 2020-08-17 DIAGNOSIS — J8410 Pulmonary fibrosis, unspecified: Secondary | ICD-10-CM

## 2020-08-17 DIAGNOSIS — J449 Chronic obstructive pulmonary disease, unspecified: Secondary | ICD-10-CM | POA: Diagnosis not present

## 2020-08-17 DIAGNOSIS — J9621 Acute and chronic respiratory failure with hypoxia: Secondary | ICD-10-CM | POA: Diagnosis not present

## 2020-08-17 DIAGNOSIS — I480 Paroxysmal atrial fibrillation: Secondary | ICD-10-CM | POA: Diagnosis not present

## 2020-08-17 NOTE — Progress Notes (Signed)
[place] NOTE  PULMONARY SERVICE ROUNDS  Date of Service: 08/17/2020  Betty [name]  DOB: 01-23-1946  Referring physician: Deanne Coffer, MD  HPI: Betty [name] is a 74 y.o. female  being seen for Acute on Chronic Respiratory Failure.  Patient is pressure support 14/5 with trying to wean gradually.  Right now appears to be comfortable white count was noted to be slightly elevated however  Review of Systems: Unremarkable other than noted in HPI  Allergies:  Reviewed on the [facility]  Medications: Reviewed  Vitals: Temperature 96.1 pulse 103 respiratory rate 20 blood pressure is 118/70 saturations 97%  Ventilator Settings: On pressure support FiO2 45% tidal volume 350 pressure 14/5  Physical Exam: . General:  calm and comfortable NAD . Eyes: normal lids, irises & conjunctiva . ENT: grossly normal tongue not enlarged . Neck: no masses . Cardiovascular: S1 S2 Normal no rubs no gallop . Respiratory: No rhonchi coarse breath sounds . Abdomen: soft non-distended . Skin: no rash seen on limited exam . Musculoskeletal:  no rigidity . Psychiatric: unable to assess . Neurologic: no involuntary movements          Lab Data and radiological Data:  White count 11.7 hemoglobin 7.8 hematocrit 29 platelet count 208   Assessment/Plan  Patient Active Problem List   Diagnosis Date Noted  . Paroxysmal atrial fibrillation (HCC)   . Acute on chronic respiratory failure with hypoxia ([place])   . Pulmonary fibrosis ([place])   . Pulmonary arterial hypertension ([place])   . COPD (chronic obstructive pulmonary disease) ([place]) 02/26/2018      1. Acute on chronic respiratory failure with hypoxia patient is going to be continued on attempting weaning right now is on pressure 14/5 we will continue to advance. 2. Paroxysmal atrial fibrillation rate is controlled we will continue to monitor 3. Pulmonary fibrosis no change supportive care 4. Pulmonary arterial hypertension at  baseline 5. COPD medical management   I have personally evaluated the patient, evaluated the laboratory and imaging results and formulated the assessment and plan and placed orders as needed. The Patient requires high complexity decision making with multiple system involvement. Rounds were done with the Respiratory Therapy Director and respiratory therapist involved in the care of the patient as well as nursing staff.   Allyne Gee, MD [facility] Pulmonary Critical Care Medicine   This note is for inpatient care

## 2020-08-18 ENCOUNTER — Other Ambulatory Visit (HOSPITAL_COMMUNITY): Payer: Medicare PPO | Admitting: Internal Medicine

## 2020-08-18 DIAGNOSIS — J8410 Pulmonary fibrosis, unspecified: Secondary | ICD-10-CM

## 2020-08-18 DIAGNOSIS — J9621 Acute and chronic respiratory failure with hypoxia: Secondary | ICD-10-CM

## 2020-08-18 DIAGNOSIS — I2721 Secondary pulmonary arterial hypertension: Secondary | ICD-10-CM | POA: Diagnosis not present

## 2020-08-18 DIAGNOSIS — J449 Chronic obstructive pulmonary disease, unspecified: Secondary | ICD-10-CM | POA: Diagnosis not present

## 2020-08-18 DIAGNOSIS — I480 Paroxysmal atrial fibrillation: Secondary | ICD-10-CM

## 2020-08-18 NOTE — Progress Notes (Signed)
[place] NOTE  PULMONARY SERVICE ROUNDS  Date of Service: 08/18/2020  Betty [name]  DOB: 1946/03/12  Referring physician: Deanne Coffer, MD  HPI: Betty [name] is a 74 y.o. female  being seen for Acute on Chronic Respiratory Failure.  She is on full support on assist control mode has been on 35% FiO2.  Respiratory therapy states that she is not tolerating the weaning attempts well  Review of Systems: Unremarkable other than noted in HPI  Allergies:  Reviewed on the [facility]  Medications: Reviewed  Vitals: Temperature 99.8 pulse 107 respiratory 20 blood pressure is 118/67 saturations 97%  Ventilator Settings: On assist control FiO2 35% tidal volume 480 PEEP 5  Physical Exam: . General:  calm and comfortable NAD . Eyes: normal lids, irises & conjunctiva . ENT: grossly normal tongue not enlarged . Neck: no masses . Cardiovascular: S1 S2 Normal no rubs no gallop . Respiratory: Scattered coarse breath sounds noted bilaterally . Abdomen: soft non-distended . Skin: no rash seen on limited exam . Musculoskeletal:  no rigidity . Psychiatric: unable to assess . Neurologic: no involuntary movements          Lab Data and radiological Data:  White count 13 hemoglobin 6.6 hematocrit 23.2 platelet count 189   Assessment/Plan  Patient Active Problem List   Diagnosis Date Noted  . Paroxysmal atrial fibrillation (HCC)   . Acute on chronic respiratory failure with hypoxia ([place])   . Pulmonary fibrosis ([place])   . Pulmonary arterial hypertension ([place])   . COPD (chronic obstructive pulmonary disease) ([place]) 02/26/2018      1. Acute on chronic respiratory failure hypoxia the plan is going to be to continue with the ventilator and full support right now patient has a hemoglobin of 6.6 will repeat requiring transfusion.  Also try to look for source of bleeding 2. Paroxysmal atrial fibrillation rate is controlled we will monitor. 3. Pulmonary fibrosis no change  supportive care oxygen therapy 4. Pulmonary hypertension at baseline 5. COPD severe disease continue supportive care   I have personally evaluated the patient, evaluated the laboratory and imaging results and formulated the assessment and plan and placed orders as needed. The Patient requires high complexity decision making with multiple system involvement. Rounds were done with the Respiratory Therapy Director and respiratory therapist involved in the care of the patient as well as nursing staff.   Allyne Gee, MD [facility] Pulmonary Critical Care Medicine   This note is for inpatient care

## 2020-08-19 ENCOUNTER — Other Ambulatory Visit (HOSPITAL_COMMUNITY): Payer: Medicare PPO | Admitting: Internal Medicine

## 2020-08-19 DIAGNOSIS — J449 Chronic obstructive pulmonary disease, unspecified: Secondary | ICD-10-CM | POA: Diagnosis not present

## 2020-08-19 DIAGNOSIS — I480 Paroxysmal atrial fibrillation: Secondary | ICD-10-CM | POA: Diagnosis not present

## 2020-08-19 DIAGNOSIS — J9621 Acute and chronic respiratory failure with hypoxia: Secondary | ICD-10-CM

## 2020-08-19 DIAGNOSIS — J8410 Pulmonary fibrosis, unspecified: Secondary | ICD-10-CM

## 2020-08-19 DIAGNOSIS — I2721 Secondary pulmonary arterial hypertension: Secondary | ICD-10-CM

## 2020-08-19 NOTE — Progress Notes (Signed)
Betty [name] NOTE  PULMONARY SERVICE ROUNDS  Date of Service: 08/19/2020  Betty [name]  DOB: 27-Mar-1946  Referring physician: Deanne Coffer, MD  HPI: Betty [name] a 75 y.o. female  being seen for Acute on Chronic Respiratory Failure.  Patient [name] comfortable right now without distress remains on pressure support on 35% FiO2 has been on 14/5 on the pressures  Review of Systems: Unremarkable other than noted in HPI  Allergies:  Reviewed on the [facility]  Medications: Reviewed  Vitals: Temperature [name] 98.9 pulse 96 respiratory rate 24 blood pressure [name] 100/70 saturations 93%  Ventilator Settings: On pressure support FiO2 35% pressure 14/5 tidal volume 458  Physical Exam: . General:  calm and comfortable NAD . Eyes: normal lids, irises & conjunctiva . ENT: grossly normal tongue not enlarged . Neck: no masses . Cardiovascular: S1 S2 Normal no rubs no gallop . Respiratory: No rhonchi very coarse breath sounds . Abdomen: soft non-distended . Skin: no rash seen on limited exam . Musculoskeletal:  no rigidity . Psychiatric: unable to assess . Neurologic: no involuntary movements          Lab Data and radiological Data:  Sodium 149 potassium 4.7 BUN 37 creatinine 1.0 White count 10.1 hemoglobin 7 hematocrit 23.6 platelet count 149   Assessment/Plan  Patient Active Problem List   Diagnosis Date Noted  . Paroxysmal atrial fibrillation (HCC)   . Acute on chronic respiratory failure with hypoxia ([place])   . Pulmonary fibrosis ([place])   . Pulmonary arterial hypertension ([place])   . COPD (chronic obstructive pulmonary disease) ([place]) 02/26/2018      1. Acute on chronic respiratory failure with hypoxia we will continue to wean on pressure support patient's hemoglobin was on the borderline may need transfusion 2. Paroxysmal atrial fibrillation rate controlled we will monitor 3. Pulmonary fibrosis no change 4. Pulmonary hypertension at baseline 5. COPD we  will continue with supportive care   I have personally evaluated the patient, evaluated the laboratory and imaging results and formulated the assessment and plan and placed orders as needed. The Patient requires high complexity decision making with multiple system involvement. Rounds were done with the Respiratory Therapy Director and respiratory therapist involved in the care of the patient as well as nursing staff.   Allyne Gee, MD [facility] Pulmonary Critical Care Medicine   This note [name] for inpatient care

## 2020-08-21 ENCOUNTER — Other Ambulatory Visit (HOSPITAL_COMMUNITY): Payer: Medicare PPO | Admitting: Internal Medicine

## 2020-08-21 DIAGNOSIS — J8410 Pulmonary fibrosis, unspecified: Secondary | ICD-10-CM

## 2020-08-21 DIAGNOSIS — I2721 Secondary pulmonary arterial hypertension: Secondary | ICD-10-CM

## 2020-08-21 DIAGNOSIS — J9621 Acute and chronic respiratory failure with hypoxia: Secondary | ICD-10-CM

## 2020-08-21 DIAGNOSIS — I480 Paroxysmal atrial fibrillation: Secondary | ICD-10-CM | POA: Diagnosis not present

## 2020-08-21 DIAGNOSIS — J449 Chronic obstructive pulmonary disease, unspecified: Secondary | ICD-10-CM | POA: Diagnosis not present

## 2020-08-21 NOTE — Progress Notes (Signed)
[place] NOTE  PULMONARY SERVICE ROUNDS  Date of Service: 08/21/2020  Betty [name]  DOB: [birth year]  Referring physician: Deanne Coffer, MD  HPI: Betty [name] is a 74 y.o. female  being seen for Acute on Chronic Respiratory Failure.  Patient right now is on full support on the ventilator and pressure assist control mode ABG was done still significantly hypoxic  Review of Systems: Unremarkable other than noted in HPI  Allergies:  Reviewed on the [facility]  Medications: Reviewed  Vitals: Temperature is 98.3 pulse 87 respiratory 20 blood pressure is 119/61 saturations 98%  Ventilator Settings: On pressure assist control FiO2 35% tidal line 426 IP 14 PEEP 5  Physical Exam:  General:  calm and comfortable NAD  Eyes: normal lids, irises & conjunctiva  ENT: grossly normal tongue not enlarged  Neck: no masses  Cardiovascular: S1 S2 Normal no rubs no gallop  Respiratory: Coarse breath sounds with few scattered rhonchi  Abdomen: soft non-distended  Skin: no rash seen on limited exam  Musculoskeletal:  no rigidity  Psychiatric: unable to assess  Neurologic: no involuntary movements          Lab Data and radiological Data:  ABG pH 7.496 PCO2 51 PO2 65 Sodium 150 potassium 4.2 BUN 36 creatinine 1.0 White count 8.8 hemoglobin 7.4 hematocrit 25.8 platelet count 177   Assessment/Plan  Patient Active Problem List   Diagnosis Date Noted   Paroxysmal atrial fibrillation (HCC)    Acute on chronic respiratory failure with hypoxia (HCC)    Pulmonary fibrosis (HCC)    Pulmonary arterial hypertension (HCC)    COPD (chronic obstructive pulmonary disease) ([place]) 02/26/2018      1. Acute on chronic respiratory failure with hypoxia we will continue with the full support on the ventilator patient has not been tolerating any weaning so far 2. Paroxysmal atrial fibrillation rate is controlled we will continue with supportive care 3. Pulmonary  fibrosis advanced disease may be limiting factor to weaning 4. Pulmonary arterial hypertension supportive care 5. COPD medical management nebulizers as needed   I have personally evaluated the patient, evaluated the laboratory and imaging results and formulated the assessment and plan and placed orders as needed. The Patient requires high complexity decision making with multiple system involvement. Rounds were done with the Respiratory Therapy Director and respiratory therapist involved in the care of the patient as well as nursing staff.   Allyne Gee, MD [facility] Pulmonary Critical Care Medicine   This note is for inpatient care

## 2020-08-22 ENCOUNTER — Other Ambulatory Visit (HOSPITAL_COMMUNITY): Payer: Medicare PPO | Admitting: Internal Medicine

## 2020-08-22 DIAGNOSIS — J9621 Acute and chronic respiratory failure with hypoxia: Secondary | ICD-10-CM

## 2020-08-22 DIAGNOSIS — I480 Paroxysmal atrial fibrillation: Secondary | ICD-10-CM | POA: Diagnosis not present

## 2020-08-22 DIAGNOSIS — J449 Chronic obstructive pulmonary disease, unspecified: Secondary | ICD-10-CM

## 2020-08-22 DIAGNOSIS — J8410 Pulmonary fibrosis, unspecified: Secondary | ICD-10-CM

## 2020-08-22 DIAGNOSIS — I2721 Secondary pulmonary arterial hypertension: Secondary | ICD-10-CM | POA: Diagnosis not present

## 2020-08-22 NOTE — Progress Notes (Signed)
[place] NOTE  PULMONARY SERVICE ROUNDS  Date of Service: 08/22/2020  Betty [name]  DOB: Nov 29, 1946  Referring physician: Deanne Coffer, MD  HPI: Betty [name] is a 74 y.o. female  being seen for Acute on Chronic Respiratory Failure.  She appeared to be a little bit more awake today was on pressure control mode on 35% FiO2.  Good saturations were noted  Review of Systems: Unremarkable other than noted in HPI  Allergies:  Reviewed on the [facility]  Medications: Reviewed  Vitals: Temperature 99.3 pulse 98 respiratory rate 20 blood pressure is 99/47 saturations 94%  Ventilator Settings: On pressure assist control FiO2 35% tidal volume 5 5 IP 16 PEEP 6  Physical Exam: . General:  calm and comfortable NAD . Eyes: normal lids, irises & conjunctiva . ENT: grossly normal tongue not enlarged . Neck: no masses . Cardiovascular: S1 S2 Normal no rubs no gallop . Respiratory: No rhonchi very coarse breath sounds . Abdomen: soft non-distended . Skin: no rash seen on limited exam . Musculoskeletal:  no rigidity . Psychiatric: unable to assess . Neurologic: no involuntary movements          Lab Data and radiological Data:  Sodium 151 potassium 4.5 BUN 37 creatinine 1.1 White count 9.0 hemoglobin 6.9 hematocrit 24.3 platelet 175   Assessment/Plan  Patient Active Problem List   Diagnosis Date Noted  . Paroxysmal atrial fibrillation (HCC)   . Acute on chronic respiratory failure with hypoxia ([place])   . Pulmonary fibrosis ([place])   . Pulmonary arterial hypertension ([place])   . COPD (chronic obstructive pulmonary disease) ([place]) 02/26/2018      1. Acute on chronic respiratory failure with hypoxia plan is to continue with full support on the ventilator right now patient's hemoglobin is low and therefore will require transfusion also electrolyte imbalance appears to be on the dry side 2. Paroxysmal atrial fibrillation rate now rate is controlled we will continue  to monitor 3. COPD severe disease supportive care medical management 4. Pulmonary hypertension follow-up after discharge 5. Pulmonary fibrosis no change prognosis guarded   I have personally evaluated the patient, evaluated the laboratory and imaging results and formulated the assessment and plan and placed orders as needed. The Patient requires high complexity decision making with multiple system involvement. Rounds were done with the Respiratory Therapy Director and respiratory therapist involved in the care of the patient as well as nursing staff.   Allyne Gee, MD [facility] Pulmonary Critical Care Medicine   This note is for inpatient care

## 2020-08-23 ENCOUNTER — Other Ambulatory Visit (HOSPITAL_COMMUNITY): Payer: Medicare PPO | Admitting: Internal Medicine

## 2020-08-23 DIAGNOSIS — J8410 Pulmonary fibrosis, unspecified: Secondary | ICD-10-CM

## 2020-08-23 DIAGNOSIS — I2721 Secondary pulmonary arterial hypertension: Secondary | ICD-10-CM | POA: Diagnosis not present

## 2020-08-23 DIAGNOSIS — J449 Chronic obstructive pulmonary disease, unspecified: Secondary | ICD-10-CM | POA: Diagnosis not present

## 2020-08-23 DIAGNOSIS — J9621 Acute and chronic respiratory failure with hypoxia: Secondary | ICD-10-CM | POA: Diagnosis not present

## 2020-08-23 DIAGNOSIS — I480 Paroxysmal atrial fibrillation: Secondary | ICD-10-CM | POA: Diagnosis not present

## 2020-08-23 NOTE — Progress Notes (Signed)
[place] NOTE  PULMONARY SERVICE ROUNDS  Date of Service: 08/23/2020  [name]  DOB: 05-May-1946  Referring physician: Deanne Coffer, MD  HPI: NIALA STCHARLES is a 74 y.o. female  being seen for Acute on Chronic Respiratory Failure.  Patient currently is on pressure control mode was attempted on spontaneous breathing trial but did not tolerate.  Ports placed back on the ventilator.  Review of Systems: Unremarkable other than noted in HPI  Allergies:  Reviewed on the [facility]  Medications: Reviewed  Vitals: Temperature is 97.2 pulse 92 respiratory 19 blood pressure is 102/58 saturations 97%  Ventilator Settings: Pressure control FiO2 35% tidal volume 579 IP 14 PEEP 6  Physical Exam: . General:  calm and comfortable NAD . Eyes: normal lids, irises & conjunctiva . ENT: grossly normal tongue not enlarged . Neck: no masses . Cardiovascular: S1 S2 Normal no rubs no gallop . Respiratory: No rhonchi very coarse breath sounds . Abdomen: soft non-distended . Skin: no rash seen on limited exam . Musculoskeletal:  no rigidity . Psychiatric: unable to assess . Neurologic: no involuntary movements          Lab Data and radiological Data:  Sodium 149 potassium 4.2 BUN 37 creatinine 1.1 White count 12.3 globin 7.4 hematocrit 25.9 platelet count 185   Assessment/Plan  Patient Active Problem List   Diagnosis Date Noted  . Paroxysmal atrial fibrillation (HCC)   . Acute on chronic respiratory failure with hypoxia ([place])   . Pulmonary fibrosis ([place])   . Pulmonary arterial hypertension ([place])   . COPD (chronic obstructive pulmonary disease) ([place]) 02/26/2018      1. Acute on chronic respiratory failure with hypoxia we will continue with pressure control, titrate oxygen continue pulmonary toilet. 2. Paroxysmal atrial fibrillation rate is controlled continue with supportive care 3. Pulmonary fibrosis advanced disease prognosis guarded 4. Pulmonary  hypertension supportive care 5. Severe COPD medical management nebulizers as needed   I have personally evaluated the patient, evaluated the laboratory and imaging results and formulated the assessment and plan and placed orders as needed. The Patient requires high complexity decision making with multiple system involvement. Rounds were done with the Respiratory Therapy Director and respiratory therapist involved in the care of the patient as well as nursing staff.   Allyne Gee, MD [facility] Pulmonary Critical Care Medicine   This note is for inpatient care

## 2020-08-24 ENCOUNTER — Other Ambulatory Visit (HOSPITAL_COMMUNITY): Payer: Medicare PPO | Admitting: Internal Medicine

## 2020-08-24 DIAGNOSIS — J9621 Acute and chronic respiratory failure with hypoxia: Secondary | ICD-10-CM

## 2020-08-24 DIAGNOSIS — I480 Paroxysmal atrial fibrillation: Secondary | ICD-10-CM | POA: Diagnosis not present

## 2020-08-24 DIAGNOSIS — I2721 Secondary pulmonary arterial hypertension: Secondary | ICD-10-CM | POA: Diagnosis not present

## 2020-08-24 DIAGNOSIS — J449 Chronic obstructive pulmonary disease, unspecified: Secondary | ICD-10-CM | POA: Diagnosis not present

## 2020-08-24 DIAGNOSIS — J8410 Pulmonary fibrosis, unspecified: Secondary | ICD-10-CM

## 2020-08-24 NOTE — Progress Notes (Signed)
[place] NOTE  PULMONARY SERVICE ROUNDS  Date of Service: 08/24/2020  Betty [name]  DOB: 11/28/1946  Referring physician: Deanne Coffer, MD  HPI: Betty [name] is a 74 y.o. female  being seen for Acute on Chronic Respiratory Failure.  She is on pressure support currently on 35% FiO2 has been on pressure of 14/5.  Still not quite ready for been seen on weaning  Review of Systems: Unremarkable other than noted in HPI  Allergies:  Reviewed on the [facility]  Medications: Reviewed  Vitals: Temperature is 98.0 pulse 101 respiratory rate 17 blood pressure is 108/60 saturations 96%  Ventilator Settings: On pressure support FiO2 is 35% tidal line 362 pressure 14/5  Physical Exam: . General:  calm and comfortable NAD . Eyes: normal lids, irises & conjunctiva . ENT: grossly normal tongue not enlarged . Neck: no masses . Cardiovascular: S1 S2 Normal no rubs no gallop . Respiratory: Scattered rhonchi very coarse breath sounds . Abdomen: soft non-distended . Skin: no rash seen on limited exam . Musculoskeletal:  no rigidity . Psychiatric: unable to assess . Neurologic: no involuntary movements          Lab Data and radiological Data:  Sodium 149 potassium 5.4 BUN 40 creatinine 1.0   Assessment/Plan  Patient Active Problem List   Diagnosis Date Noted  . Paroxysmal atrial fibrillation (HCC)   . Acute on chronic respiratory failure with hypoxia ([place])   . Pulmonary fibrosis ([place])   . Pulmonary arterial hypertension ([place])   . COPD (chronic obstructive pulmonary disease) ([place]) 02/26/2018      1. Acute on chronic respiratory failure hypoxia plan is to continue with the pressure support weaning as tolerated.  Continues on 14/5 as far as the pressures concerned. 2. Paroxysmal atrial fibrillation rate is controlled we will continue to follow. 3. Pulmonary fibrosis no change continue supportive care 4. Pulmonary artery hypertension note change supportive  care follow-up after discharge 5. COPD at baseline   I have personally evaluated the patient, evaluated the laboratory and imaging results and formulated the assessment and plan and placed orders as needed. The Patient requires high complexity decision making with multiple system involvement. Rounds were done with the Respiratory Therapy Director and respiratory therapist involved in the care of the patient as well as nursing staff.   Allyne Gee, MD [facility] Pulmonary Critical Care Medicine   This note is for inpatient care

## 2020-08-28 ENCOUNTER — Other Ambulatory Visit (HOSPITAL_COMMUNITY): Payer: Medicare PPO | Admitting: Internal Medicine

## 2020-08-28 DIAGNOSIS — J449 Chronic obstructive pulmonary disease, unspecified: Secondary | ICD-10-CM | POA: Diagnosis not present

## 2020-08-28 DIAGNOSIS — I480 Paroxysmal atrial fibrillation: Secondary | ICD-10-CM

## 2020-08-28 DIAGNOSIS — I2721 Secondary pulmonary arterial hypertension: Secondary | ICD-10-CM

## 2020-08-28 DIAGNOSIS — J8410 Pulmonary fibrosis, unspecified: Secondary | ICD-10-CM

## 2020-08-28 DIAGNOSIS — J9621 Acute and chronic respiratory failure with hypoxia: Secondary | ICD-10-CM

## 2020-08-28 NOTE — Progress Notes (Signed)
[place] NOTE  PULMONARY SERVICE ROUNDS  Date of Service: 08/28/2020  JACKELYNE SAYER  DOB: 05/13/1946  Referring physician: Deanne Coffer, MD  HPI: Betty [name] is a 74 y.o. female  being seen for Acute on Chronic Respiratory Failure.  Patient is on pressure support currently on 35% FiO2 volumes are looking better she should be able to make an attempt at weaning again this afternoon  Review of Systems: Unremarkable other than noted in HPI  Allergies:  Reviewed on the [facility]  Medications: Reviewed  Vitals: Temperature 97.6 pulse 68 respiratory rate 20 blood pressure is 119/70 saturations 99%  Ventilator Settings: On pressure support FiO2 35% pressure 16/8 tidal volume 668  Physical Exam: . General:  calm and comfortable NAD . Eyes: normal lids, irises & conjunctiva . ENT: grossly normal tongue not enlarged . Neck: no masses . Cardiovascular: S1 S2 Normal no rubs no gallop . Respiratory: Very coarse breath sounds . Abdomen: soft non-distended . Skin: no rash seen on limited exam . Musculoskeletal:  no rigidity . Psychiatric: unable to assess . Neurologic: no involuntary movements          Lab Data and radiological Data:  Sodium 146 potassium 3.3 BUN 46 creatinine is 1.0 White count 10.1 hemoglobin 6.8 hematocrit 23.9 platelet count 277   Assessment/Plan  Patient Active Problem List   Diagnosis Date Noted  . Paroxysmal atrial fibrillation (HCC)   . Acute on chronic respiratory failure with hypoxia ([place])   . Pulmonary fibrosis ([place])   . Pulmonary arterial hypertension ([place])   . COPD (chronic obstructive pulmonary disease) ([place]) 02/26/2018      1. Acute on chronic respiratory failure with hypoxia continue with pressure support patient is going to titrate oxygen as tolerated we will continue pulmonary toilet. 2. Paroxysmal atrial fibrillation rate is controlled we will continue to follow 3. Pulmonary fibrosis patient is at  baseline 4. Pulmonary hypertension supportive care 5. COPD medical management   I have personally evaluated the patient, evaluated the laboratory and imaging results and formulated the assessment and plan and placed orders as needed. The Patient requires high complexity decision making with multiple system involvement. Rounds were done with the Respiratory Therapy Director and respiratory therapist involved in the care of the patient as well as nursing staff.   Allyne Gee, MD [facility] Pulmonary Critical Care Medicine   This note is for inpatient care

## 2020-08-29 ENCOUNTER — Other Ambulatory Visit (HOSPITAL_COMMUNITY): Payer: Medicare PPO | Admitting: Internal Medicine

## 2020-08-29 DIAGNOSIS — J9621 Acute and chronic respiratory failure with hypoxia: Secondary | ICD-10-CM

## 2020-08-29 DIAGNOSIS — I480 Paroxysmal atrial fibrillation: Secondary | ICD-10-CM | POA: Diagnosis not present

## 2020-08-29 DIAGNOSIS — J449 Chronic obstructive pulmonary disease, unspecified: Secondary | ICD-10-CM

## 2020-08-29 DIAGNOSIS — I2721 Secondary pulmonary arterial hypertension: Secondary | ICD-10-CM

## 2020-08-29 DIAGNOSIS — J8410 Pulmonary fibrosis, unspecified: Secondary | ICD-10-CM

## 2020-08-29 NOTE — Progress Notes (Signed)
[place] NOTE  PULMONARY SERVICE ROUNDS  Date of Service: 08/29/2020  Betty [name]  DOB: 1946/10/04  Referring physician: Deanne Coffer, MD  HPI: Betty [name] is a 74 y.o. female  being seen for Acute on Chronic Respiratory Failure.  Patient was on pressure support on FiO2 35% the pressure was 18/8  Review of Systems: Unremarkable other than noted in HPI  Allergies:  Reviewed on the [facility]  Medications: Reviewed  Vitals: Temperature is 96.8 pulse 68 respiratory 14 blood pressure is 115/59 saturations 98%  Ventilator Settings: On pressure support FiO2 35% tidal volume 654 pressures 18/8  Physical Exam: . General:  calm and comfortable NAD . Eyes: normal lids, irises & conjunctiva . ENT: grossly normal tongue not enlarged . Neck: no masses . Cardiovascular: S1 S2 Normal no rubs no gallop . Respiratory: Scattered rhonchi expansion is equal . Abdomen: soft non-distended . Skin: no rash seen on limited exam . Musculoskeletal:  no rigidity . Psychiatric: unable to assess . Neurologic: no involuntary movements          Lab Data and radiological Data:  Sodium 143 potassium 3.3 BUN 44 creatinine 0.9   Assessment/Plan  Patient Active Problem List   Diagnosis Date Noted  . Paroxysmal atrial fibrillation (HCC)   . Acute on chronic respiratory failure with hypoxia ([place])   . Pulmonary fibrosis ([place])   . Pulmonary arterial hypertension ([place])   . COPD (chronic obstructive pulmonary disease) ([place]) 02/26/2018      1. Acute on chronic respiratory failure hypoxia continue with pressure support titrate oxygen as tolerated continue pulmonary toilet. 2. Paroxysmal atrial fibrillation rate is controlled we will continue to follow 3. Pulmonary fibrosis supportive care prognosis quite guarded 4. Pulmonary hypertension at baseline we will continue to follow 5. Severe COPD follow-up with primary pulmonologist after discharge right now is going to continue  with medical management   I have personally evaluated the patient, evaluated the laboratory and imaging results and formulated the assessment and plan and placed orders as needed. The Patient requires high complexity decision making with multiple system involvement. Rounds were done with the Respiratory Therapy Director and respiratory therapist involved in the care of the patient as well as nursing staff.   Allyne Gee, MD [facility] Pulmonary Critical Care Medicine   This note is for inpatient care

## 2020-08-30 ENCOUNTER — Other Ambulatory Visit (HOSPITAL_COMMUNITY): Payer: Medicare PPO | Admitting: Internal Medicine

## 2020-08-30 DIAGNOSIS — J8410 Pulmonary fibrosis, unspecified: Secondary | ICD-10-CM

## 2020-08-30 DIAGNOSIS — I480 Paroxysmal atrial fibrillation: Secondary | ICD-10-CM | POA: Diagnosis not present

## 2020-08-30 DIAGNOSIS — J449 Chronic obstructive pulmonary disease, unspecified: Secondary | ICD-10-CM

## 2020-08-30 DIAGNOSIS — J9621 Acute and chronic respiratory failure with hypoxia: Secondary | ICD-10-CM

## 2020-08-30 DIAGNOSIS — I2721 Secondary pulmonary arterial hypertension: Secondary | ICD-10-CM | POA: Diagnosis not present

## 2020-08-30 NOTE — Progress Notes (Signed)
[name] NOTE  PULMONARY SERVICE ROUNDS  Date of Service: 08/30/2020  Betty [name]  DOB: 1946/11/14  Referring physician: Deanne Coffer, MD  HPI: Betty [name] is a 74 y.o. female  being seen for Acute on Chronic Respiratory Failure.  Patient currently is on pressure support has been on 35% FiO2 currently on a pressure support of 15/8 decreased from 18/8  Review of Systems: Unremarkable other than noted in HPI  Allergies:  Reviewed on the [facility]  Medications: Reviewed  Vitals: Temperature 96.7 pulse 81 respiratory rate 14 blood pressure 100/48 saturations 100%  Ventilator Settings: On pressure support FiO2 35% pressure 15/8  Physical Exam: . General:  calm and comfortable NAD . Eyes: normal lids, irises & conjunctiva . ENT: grossly normal tongue not enlarged . Neck: no masses . Cardiovascular: S1 S2 Normal no rubs no gallop . Respiratory: No rhonchi very coarse breath sounds . Abdomen: soft non-distended . Skin: no rash seen on limited exam . Musculoskeletal:  no rigidity . Psychiatric: unable to assess . Neurologic: no involuntary movements          Lab Data and radiological Data:  Sodium 141 potassium 3.0 BUN 46 creatinine 0.9 White count 9.2 hemoglobin 6.4 hematocrit 22.3 platelet count 232   Assessment/Plan  Patient Active Problem List   Diagnosis Date Noted  . Paroxysmal atrial fibrillation (HCC)   . Acute on chronic respiratory failure with hypoxia ([place])   . Pulmonary fibrosis ([place])   . Pulmonary arterial hypertension ([place])   . COPD (chronic obstructive pulmonary disease) ([place]) 02/26/2018      1. Acute on chronic respiratory failure with hypoxia right now patient is down to pressure support of 15/8 however hemoglobin was also down to 6.4 and will be needing a transfusion 2. Paroxysmal atrial fibrillation rate is controlled we will continue with supportive care. 3. Pulmonary fibrosis with pulmonary retention supportive care  will need follow-up after discharge 4. Severe COPD supportive care medical management   I have personally evaluated the patient, evaluated the laboratory and imaging results and formulated the assessment and plan and placed orders as needed. The Patient requires high complexity decision making with multiple system involvement. Rounds were done with the Respiratory Therapy Director and respiratory therapist involved in the care of the patient as well as nursing staff.   Allyne Gee, MD [facility] Pulmonary Critical Care Medicine   This note is for inpatient care

## 2020-08-31 ENCOUNTER — Other Ambulatory Visit (HOSPITAL_COMMUNITY): Payer: Medicare PPO | Admitting: Internal Medicine

## 2020-08-31 DIAGNOSIS — I2721 Secondary pulmonary arterial hypertension: Secondary | ICD-10-CM

## 2020-08-31 DIAGNOSIS — I480 Paroxysmal atrial fibrillation: Secondary | ICD-10-CM

## 2020-08-31 DIAGNOSIS — J9621 Acute and chronic respiratory failure with hypoxia: Secondary | ICD-10-CM | POA: Diagnosis not present

## 2020-08-31 DIAGNOSIS — J449 Chronic obstructive pulmonary disease, unspecified: Secondary | ICD-10-CM | POA: Diagnosis not present

## 2020-08-31 DIAGNOSIS — J8410 Pulmonary fibrosis, unspecified: Secondary | ICD-10-CM

## 2020-08-31 NOTE — Progress Notes (Signed)
[place] NOTE  PULMONARY SERVICE ROUNDS  Date of Service: 08/31/2020  Betty [name]  DOB: [birth year]  Referring physician: Deanne Coffer, MD  HPI: Betty [name] is a 74 y.o. female  being seen for Acute on Chronic Respiratory Failure.  Patient currently is on pressure support has been on 35% FiO2 good saturations are noted. Pressure of 18/8  Review of Systems: Unremarkable other than noted in HPI  Allergies:  Reviewed on the [facility]  Medications: Reviewed  Vitals: Temperature is 98.0 pulse 78 respiratory rate 20 blood pressure is 125/71 saturations 98%  Ventilator Settings: On pressure support FiO2 35% tidal volume 519 IP 18 PEEP 8  Physical Exam: . General:  calm and comfortable NAD . Eyes: normal lids, irises & conjunctiva . ENT: grossly normal tongue not enlarged . Neck: no masses . Cardiovascular: S1 S2 Normal no rubs no gallop . Respiratory: No rhonchi very coarse breath sounds . Abdomen: soft non-distended . Skin: no rash seen on limited exam . Musculoskeletal:  no rigidity . Psychiatric: unable to assess . Neurologic: no involuntary movements          Lab Data and radiological Data:  No labs today   Assessment/Plan  Patient Active Problem List   Diagnosis Date Noted  . Paroxysmal atrial fibrillation (HCC)   . Acute on chronic respiratory failure with hypoxia ([place])   . Pulmonary fibrosis ([place])   . Pulmonary arterial hypertension ([place])   . COPD (chronic obstructive pulmonary disease) ([place]) 02/26/2018      1. Acute on chronic respiratory failure hypoxia plan is to continue with weaning on pressure support as tolerated pressure levels have gone up but will try to decrease them again 2. Paroxysmal atrial fibrillation rate is controlled 3. Pulmonary fibrosis no change 4. Pulmonary artery hypertension at baseline 5. Severe COPD medical management   I have personally evaluated the patient, evaluated the laboratory and imaging  results and formulated the assessment and plan and placed orders as needed. The Patient requires high complexity decision making with multiple system involvement. Rounds were done with the Respiratory Therapy Director and respiratory therapist involved in the care of the patient as well as nursing staff.   Allyne Gee, MD [facility] Pulmonary Critical Care Medicine   This note is for inpatient care

## 2020-09-01 ENCOUNTER — Other Ambulatory Visit (HOSPITAL_COMMUNITY): Payer: Medicare PPO | Admitting: Internal Medicine

## 2020-09-01 DIAGNOSIS — J449 Chronic obstructive pulmonary disease, unspecified: Secondary | ICD-10-CM

## 2020-09-01 DIAGNOSIS — I480 Paroxysmal atrial fibrillation: Secondary | ICD-10-CM | POA: Diagnosis not present

## 2020-09-01 DIAGNOSIS — J9621 Acute and chronic respiratory failure with hypoxia: Secondary | ICD-10-CM

## 2020-09-01 DIAGNOSIS — I2721 Secondary pulmonary arterial hypertension: Secondary | ICD-10-CM | POA: Diagnosis not present

## 2020-09-01 DIAGNOSIS — J8410 Pulmonary fibrosis, unspecified: Secondary | ICD-10-CM

## 2020-09-01 NOTE — Progress Notes (Signed)
[place] NOTE  PULMONARY SERVICE ROUNDS  Date of Service: 09/01/2020  Betty [name]  DOB: [birth year]  Referring physician: Deanne Coffer, MD  HPI: Betty [name] is a 74 y.o. female  being seen for Acute on Chronic Respiratory Failure.  Patient currently is on pressure support has been on a pressure of 15/8  Review of Systems: Unremarkable other than noted in HPI  Allergies:  Reviewed on the [facility]  Medications: Reviewed  Vitals: Temperature 98.0 pulse 79 respiratory rate 16 blood pressure is 90/45 saturations 99%  Ventilator Settings: On pressure support FiO2 is 35% tidal volume was 493 pressure 15/8  Physical Exam: . General:  calm and comfortable NAD . Eyes: normal lids, irises & conjunctiva . ENT: grossly normal tongue not enlarged . Neck: no masses . Cardiovascular: S1 S2 Normal no rubs no gallop . Respiratory: No rhonchi very coarse breath sounds . Abdomen: soft non-distended . Skin: no rash seen on limited exam . Musculoskeletal:  no rigidity . Psychiatric: unable to assess . Neurologic: no involuntary movements          Lab Data and radiological Data:  Sodium 147 potassium 3.6 BUN 45 creatinine 0.7 White count 9.7 hemoglobin 7.7 hematocrit 25 platelet count 215   Assessment/Plan  Patient Active Problem List   Diagnosis Date Noted  . Paroxysmal atrial fibrillation (HCC)   . Acute on chronic respiratory failure with hypoxia ([place])   . Pulmonary fibrosis ([place])   . Pulmonary arterial hypertension ([place])   . COPD (chronic obstructive pulmonary disease) ([place]) 02/26/2018      1. Acute on chronic respiratory failure with hypoxia we will continue with the support as tolerated patient is on a pressure of 15/8. 2. Paroxysmal atrial fibrillation rate is controlled we will continue with supportive care 3. Pulmonary fibrosis no change 4. Pulmonary hypertension at baseline we will monitor 5. COPD severe disease continue with medical  management   I have personally evaluated the patient, evaluated the laboratory and imaging results and formulated the assessment and plan and placed orders as needed. The Patient requires high complexity decision making with multiple system involvement. Rounds were done with the Respiratory Therapy Director and respiratory therapist involved in the care of the patient as well as nursing staff.   Allyne Gee, MD [facility] Pulmonary Critical Care Medicine   This note is for inpatient care

## 2020-09-03 ENCOUNTER — Other Ambulatory Visit (HOSPITAL_COMMUNITY): Payer: Medicare PPO | Admitting: Internal Medicine

## 2020-09-03 DIAGNOSIS — J8410 Pulmonary fibrosis, unspecified: Secondary | ICD-10-CM

## 2020-09-03 DIAGNOSIS — J9621 Acute and chronic respiratory failure with hypoxia: Secondary | ICD-10-CM

## 2020-09-03 DIAGNOSIS — I2721 Secondary pulmonary arterial hypertension: Secondary | ICD-10-CM | POA: Diagnosis not present

## 2020-09-03 DIAGNOSIS — J449 Chronic obstructive pulmonary disease, unspecified: Secondary | ICD-10-CM | POA: Diagnosis not present

## 2020-09-03 DIAGNOSIS — I480 Paroxysmal atrial fibrillation: Secondary | ICD-10-CM

## 2020-09-03 NOTE — Progress Notes (Signed)
Betty [name]  PULMONARY SERVICE ROUNDS  Date of Service: 09/03/2020  Betty [name]  DOB: Feb 26, 1946  Referring physician: Deanne Coffer, MD  HPI: Betty [name] is a 74 y.o. female  being seen for Acute on Chronic Respiratory Failure.  Patient is on pressure support possibly has some issues with GI bleeding which is ongoing and is being worked up by the primary team.  Right now she is not been tolerating weaning  Review of Systems: Unremarkable other than noted in HPI  Allergies:  Reviewed on the [facility]  Medications: Reviewed  Vitals: Temperature 97.0 pulse 90 respiratory rate 20 blood pressure is 118/67 saturations 98%  Ventilator Settings: On pressure support FiO2 35% tidal line 526 pressure 15/8  Physical Exam: . General:  calm and comfortable NAD . Eyes: normal lids, irises & conjunctiva . ENT: grossly normal tongue not enlarged . Neck: no masses . Cardiovascular: S1 S2 Normal no rubs no gallop . Respiratory: Scattered rhonchi very coarse breath sounds . Abdomen: soft non-distended . Skin: no rash seen on limited exam . Musculoskeletal:  no rigidity . Psychiatric: unable to assess . Neurologic: no involuntary movements          Lab Data and radiological Data:  Complete Blood Count (CBC) Specimen:  Blood  Ref Range & Units Today  WBC (White Blood Cell Count) 3.2 - 9.8 x10^9/L 13.5High      Hemoglobin 12.0 - 15.5 g/dL 8.5Low      Hematocrit 35.0 - 45.0 % 26.4Low      Platelets 150 - 450 x10^9/L 200      MCV (Mean Corpuscular Volume) 80 - 98 fL 96      MCH (Mean Corpuscular Hemoglobin) 26.5 - 34.0 pg 30.9      MCHC (Mean Corpuscular Hemoglobin Concentration) 31.4 - 36.0 % 32.2      RBC (Red Blood Cell Count) 3.77 - 5.16 x10^12/L 2.75Low      RDW-CV (Red Cell Distribution Width) 11.5 - 14.5 % 23.7High      NRBC (Nucleated Red Blood Cell Count) 0 x10^9/L 0.07High      NRBC % (Nucleated Red Blood Cell %) % 0.5       MPV (Mean Platelet Volume) 7.2 - 11.7 fL 9.8      Resulting Agency  [facility]  Specimen Collected: 09/03/20 4:15 AM Last Resulted: 09/03/20 4:25 AM  Received From: [place]  Result Received: 09/03/20 2:51 PM   Comprehensive Metabolic Panel (CMP) Specimen:  Blood  Ref Range & Units Today Comments  Sodium 135 - 145 mmol/L 144        Potassium 3.5 - 5.0 mmol/L 3.8        Chloride 98 - 108 mmol/L 98        Carbon Dioxide (CO2) 21 - 30 mmol/L 31High        Urea Nitrogen (BUN) 7 - 20 mg/dL 47High        Creatinine 0.4 - 1.0 mg/dL 1.0        Glucose 70 - 140 mg/dL 131  Interpretive Data:  Above is the NONFASTING reference range.   Below are the FASTING reference ranges:  NORMAL:   70-99 mg/dL  PREDIABETES: 100-125 mg/dL  DIABETES:  > 125 mg/dL       Calcium 8.7 - 10.2 mg/dL 7.7Low        AST (Aspartate Aminotransferase) 15 - 41 U/L 42High        ALT (Alanine  Aminotransferase) 14 - 54 U/L 18        Bilirubin, Total 0.4 - 1.5 mg/dL 2.4High        Alk Phos (Alkaline Phosphatase) 24 - 110 U/L 143High        Albumin 3.5 - 4.8 g/dL 2.2Low        Protein, Total 6.2 - 8.1 g/dL 5.5Low        Anion Gap 3 - 12 mmol/L 15High        BUN/CREA Ratio 6 - 27  47High        Glomerular Filtration Rate (eGFR)  mL/min/1.73sq m 56      CT abd pelvis angio w wo and post processing  Narrative Performed by [facility] EXAMINATION: CT angiogram of the abdomen and pelvis without and with  contrast   DATE: 09/02/2020   INDICATION: Gastrointestinal bleeding   COMPARISON: Chest CT 07/27/2020. CT chest, abdomen, and pelvis from 07/10/2020   TECHNIQUE: Contiguous axial images were obtained of the abdomen and pelvis  without and with 100 mL Isovue-370 intravenous contrast. Without enteric  contrast. Dose reduction was obtained with Automatic Exposure Control (AEC)  or, if AEC could not be utilized, by manual adjustment  of the mA and/or kV  according to patient size.   FINDINGS:   Gastrointestinal: On the unenhanced exam, there are areas of increased  density present throughout the colon for example in the right colon as well  as the left colon, sigmoid colon, and rectum.With administration of  contrast, there is no increase in attenuation within these regions to  suggest definite source of active gastrointestinal hemorrhage.   There is extensive diverticulosis of the colon without definite focal  inflammatory changes.There is no bowel obstruction.   Other findings: Prior sternotomy. Mitral valve prosthesis. Partially imaged  cardiac assist device. Retained epicardial leads. Bibasilar airspace  opacities, partially imaged. Trace pleural fluid.   The patient's gastrostomy tube is located outside of the lumen of the  stomach with balloon in the abdominal wall, image 65 of series 9.   No acute abnormality liver. Cholecystectomy. Portions of the head and  uncinate process of the pancreas are atrophic. New since the prior exam is  an 18 mm low-density lesion within the pancreas, image 65 of series 9.   The spleen and adrenal glands show no acute abnormalities.   No hydronephrosis. The ureters are not dilated. Urinary bladder is  underdistended. Calcification anteriorly.   There is small ascites layering in the pelvis. Diffuse mesenteric edema.  Diffuse body wall edema.   Atherosclerotic calcifications of the aorta and branch vessels. There is an  implanted stimulator device in the right gluteal region with leads  extending into the sacral region. Degenerative changes of the lumbar spine.   A mixed soft tissue and fat-containing structure of the left abdominal wall  is unchanged. A small fat-containing umbilical hernia is unchanged.  Probable injection site reaction versus small subcutaneous soft tissue  hematomas of the ventral abdominal wall.   There is a partially imaged large hematoma of  the left hip and proximal  thigh measuring at least 11.8 x 8.2 x 7 cm. The inferior extent of this  collection is not imaged.   IMPRESSION:   The exam is somewhat limited by the presence of high density material  throughout the colon as described above. Within those limitations, there  are no CT findings to indicate active gastrointestinal bleeding.   Large hematoma of the left hip/thigh with layering blood  products. This is  partially imaged. Recommend CT of the left femur for further evaluation.   Fairly extensive diverticulosis of the colon.   The patient's gastrostomy tube is malpositioned and located within the soft  tissues of the ventral abdominal wall. The adjacent soft tissues are  thickened.   Newly demonstrated 1.8 cm low-density lesion within the body of the  pancreas. Infectious/inflammatory etiology is possible. Recommend attention  on follow-up imaging.   Generalized body wall edema, ascites, and mesenteric edema.   Trace pleural effusions and bibasilar airspace opacities.   Findings of this exam and recommendations were discussed with Dr. Deboraha Sprang  at 4:05 PM.   Electronically Signed by: Elmore Guise, MD, [facility]  Electronically Signed on: 09/02/2020 4:06 PM   Assessment/Plan  Patient Active Problem List   Diagnosis Date Noted  . Paroxysmal atrial fibrillation (HCC)   . Acute on chronic respiratory failure with hypoxia ([place])   . Pulmonary fibrosis ([place])   . Pulmonary arterial hypertension ([place])   . COPD (chronic obstructive pulmonary disease) ([place]) 02/26/2018      1. Acute on chronic respiratory failure with hypoxia she is not stable for weaning at this time with multiple other issues going on patient has been having blood loss anemia of the CT of the abdomen and pelvis that was done showing a large hematoma of the left hip and thigh.  Recommended further imaging primary care team is aware 2. Paroxysmal atrial fibrillation rate now rate is  controlled we will continue to monitor on telemetry 3. Pulmonary fibrosis no change 4. Pulmonary hypertension supportive care 5. COPD medical management with nebulizers as deemed necessary. 6. Blood loss anemia source of blood loss may be the hematoma as described above discussed with primary care team   I have personally evaluated the patient, evaluated the laboratory and imaging results and formulated the assessment and plan and placed orders as needed. The Patient requires high complexity decision making with multiple system involvement. Rounds were done with the Respiratory Therapy Director and respiratory therapist involved in the care of the patient as well as nursing staff.  Time 35 min   Allyne Gee, MD [facility] Pulmonary Critical Care Medicine   This [name] is for inpatient care

## 2020-09-11 ENCOUNTER — Other Ambulatory Visit (HOSPITAL_COMMUNITY): Payer: Medicare PPO | Admitting: Internal Medicine

## 2020-09-11 DIAGNOSIS — I480 Paroxysmal atrial fibrillation: Secondary | ICD-10-CM | POA: Diagnosis not present

## 2020-09-11 DIAGNOSIS — J449 Chronic obstructive pulmonary disease, unspecified: Secondary | ICD-10-CM | POA: Diagnosis not present

## 2020-09-11 DIAGNOSIS — J9621 Acute and chronic respiratory failure with hypoxia: Secondary | ICD-10-CM

## 2020-09-11 DIAGNOSIS — J8410 Pulmonary fibrosis, unspecified: Secondary | ICD-10-CM

## 2020-09-11 DIAGNOSIS — I2721 Secondary pulmonary arterial hypertension: Secondary | ICD-10-CM

## 2020-09-11 NOTE — Progress Notes (Signed)
[place] NOTE  PULMONARY SERVICE ROUNDS  Date of Service: 09/11/2020  Betty [name]  DOB: 07-04-1946  Referring physician: Deanne Coffer, MD  HPI: Betty [name] is a 74 y.o. female  being seen for Acute on Chronic Respiratory Failure.  Patient currently is on pressure support 14/5 right now we will try to advance her on the weaning  Review of Systems: Unremarkable other than noted in HPI  Allergies:  Reviewed on the [facility]  Medications: Reviewed  Vitals: Temperature 96.0 pulse 88 respiratory rate 16 blood pressure is 139/72 saturations 100%  Ventilator Settings: On pressure support FiO2 is 35% tidal volume 528 PEEP 5 pressure support 14  Physical Exam: . General:  calm and comfortable NAD . Eyes: normal lids, irises & conjunctiva . ENT: grossly normal tongue not enlarged . Neck: no masses . Cardiovascular: S1 S2 Normal no rubs no gallop . Respiratory: Scattered rhonchi expansion is equal . Abdomen: soft non-distended . Skin: no rash seen on limited exam . Musculoskeletal:  no rigidity . Psychiatric: unable to assess . Neurologic: no involuntary movements          Lab Data and radiological Data:  No labs to report today   Assessment/Plan  Patient Active Problem List   Diagnosis Date Noted  . Paroxysmal atrial fibrillation (HCC)   . Acute on chronic respiratory failure with hypoxia ([place])   . Pulmonary fibrosis ([place])   . Pulmonary arterial hypertension ([place])   . COPD (chronic obstructive pulmonary disease) ([place]) 02/26/2018      1. Acute on chronic respiratory failure hypoxia we will continue with the weaning protocol on pressure support 2. Paroxysmal atrial fibrillation rate is controlled 3. Pulmonary fibrosis no change 4. Pulmonary hypertension we will continue supportive care 5. Severe COPD medical management   I have personally evaluated the patient, evaluated the laboratory and imaging results and formulated the assessment  and plan and placed orders as needed. The Patient requires high complexity decision making with multiple system involvement. Rounds were done with the Respiratory Therapy Director and respiratory therapist involved in the care of the patient as well as nursing staff.   Allyne Gee, MD [facility] Pulmonary Critical Care Medicine   This note is for inpatient care

## 2020-09-13 ENCOUNTER — Other Ambulatory Visit (HOSPITAL_COMMUNITY): Payer: Medicare PPO | Admitting: Internal Medicine

## 2020-09-13 DIAGNOSIS — I480 Paroxysmal atrial fibrillation: Secondary | ICD-10-CM

## 2020-09-13 DIAGNOSIS — J9621 Acute and chronic respiratory failure with hypoxia: Secondary | ICD-10-CM

## 2020-09-13 DIAGNOSIS — I2721 Secondary pulmonary arterial hypertension: Secondary | ICD-10-CM | POA: Diagnosis not present

## 2020-09-13 DIAGNOSIS — J8410 Pulmonary fibrosis, unspecified: Secondary | ICD-10-CM

## 2020-09-13 DIAGNOSIS — J449 Chronic obstructive pulmonary disease, unspecified: Secondary | ICD-10-CM

## 2020-09-13 NOTE — Progress Notes (Signed)
[place] NOTE  PULMONARY SERVICE ROUNDS  Date of Service: 09/13/2020  Betty [name]  DOB: 1946/07/01  Referring physician: Deanne Coffer, MD  HPI: Betty [name] is a 74 y.o. female  being seen for Acute on Chronic Respiratory Failure.  Patient currently is on pressure support has been on 30% FiO2 with a pressure of 14/5  Review of Systems: Unremarkable other than noted in HPI  Allergies:  Reviewed on the [facility]  Medications: Reviewed  Vitals: Temperature is 98 pulse 90 respiratory was 20 saturations 95%  Ventilator Settings: On pressure support FiO2 30% pressure 14/5  Physical Exam: . General:  calm and comfortable NAD . Eyes: normal lids, irises & conjunctiva . ENT: grossly normal tongue not enlarged . Neck: no masses . Cardiovascular: S1 S2 Normal no rubs no gallop . Respiratory: No rhonchi no rales noted at this time . Abdomen: soft non-distended . Skin: no rash seen on limited exam . Musculoskeletal:  no rigidity . Psychiatric: unable to assess . Neurologic: no involuntary movements          Lab Data and radiological Data:  Sodium 150 potassium 3.4 BUN 56 creatinine 0.8 White count 13.8 hemoglobin 7.6 hematocrit 25.2 platelet count 131   Assessment/Plan  Patient Active Problem List   Diagnosis Date Noted  . Paroxysmal atrial fibrillation (HCC)   . Acute on chronic respiratory failure with hypoxia ([place])   . Pulmonary fibrosis ([place])   . Pulmonary arterial hypertension ([place])   . COPD (chronic obstructive pulmonary disease) ([place]) 02/26/2018      1. Acute on chronic respiratory failure with hypoxia we will continue with weaning try to advance to pressure support as tolerated. 2. Paroxysmal atrial fibrillation rate is controlled 3. Pulmonary fibrosis at baseline 4. Pulmonary artery hypertension no change 5. Severe COPD we will continue to monitor   I have personally evaluated the patient, evaluated the laboratory and imaging  results and formulated the assessment and plan and placed orders as needed. The Patient requires high complexity decision making with multiple system involvement. Rounds were done with the Respiratory Therapy Director and respiratory therapist involved in the care of the patient as well as nursing staff.   Allyne Gee, MD [facility] Pulmonary Critical Care Medicine   This note is for inpatient care

## 2020-09-15 ENCOUNTER — Other Ambulatory Visit (HOSPITAL_COMMUNITY): Payer: Medicare PPO | Admitting: Internal Medicine

## 2020-09-15 DIAGNOSIS — J9621 Acute and chronic respiratory failure with hypoxia: Secondary | ICD-10-CM

## 2020-09-15 DIAGNOSIS — J449 Chronic obstructive pulmonary disease, unspecified: Secondary | ICD-10-CM | POA: Diagnosis not present

## 2020-09-15 DIAGNOSIS — J8410 Pulmonary fibrosis, unspecified: Secondary | ICD-10-CM

## 2020-09-15 DIAGNOSIS — I2721 Secondary pulmonary arterial hypertension: Secondary | ICD-10-CM

## 2020-09-15 DIAGNOSIS — I480 Paroxysmal atrial fibrillation: Secondary | ICD-10-CM | POA: Diagnosis not present

## 2020-09-15 NOTE — Progress Notes (Signed)
[place] NOTE  PULMONARY SERVICE ROUNDS  Date of Service: 09/15/2020  Betty [name]  DOB: 1946/11/23  Referring physician: Deanne Coffer, MD  HPI: Betty [name] is a 74 y.o. female  being seen for Acute on Chronic Respiratory Failure.  This morning she was off the ventilator on T collar has been on 35% FiO2  Review of Systems: Unremarkable other than noted in HPI  Allergies:  Reviewed on the [facility]  Medications: Reviewed  Vitals: Temperature 97.0 pulse 83 respiratory 18 blood pressure is 113/59 saturations 98%  Ventilator Settings: On T collar with an FiO2 of 35%  Physical Exam: . General:  calm and comfortable NAD . Eyes: normal lids, irises & conjunctiva . ENT: grossly normal tongue not enlarged . Neck: no masses . Cardiovascular: S1 S2 Normal no rubs no gallop . Respiratory: No rhonchi very coarse breath sounds . Abdomen: soft non-distended . Skin: no rash seen on limited exam . Musculoskeletal:  no rigidity . Psychiatric: unable to assess . Neurologic: no involuntary movements          Lab Data and radiological Data:  Sodium 152 potassium 3.5 BUN 50 creatinine 0.7   Assessment/Plan  Patient Active Problem List   Diagnosis Date Noted  . Paroxysmal atrial fibrillation (HCC)   . Acute on chronic respiratory failure with hypoxia ([place])   . Pulmonary fibrosis ([place])   . Pulmonary arterial hypertension ([place])   . COPD (chronic obstructive pulmonary disease) ([place]) 02/26/2018      1. Acute on chronic respiratory failure with hypoxia we will continue with weaning on T collar as tolerated 2. Paroxysmal atrial fibrillation rate controlled we will continue to follow 3. Pulmonary fibrosis at baseline 4. Pulmonary hypertension no change 5. COPD continue with present management   I have personally evaluated the patient, evaluated the laboratory and imaging results and formulated the assessment and plan and placed orders as needed. The  Patient requires high complexity decision making with multiple system involvement. Rounds were done with the Respiratory Therapy Director and respiratory therapist involved in the care of the patient as well as nursing staff.   Allyne Gee, MD [facility] Pulmonary Critical Care Medicine   This note is for inpatient care

## 2020-09-18 ENCOUNTER — Other Ambulatory Visit (HOSPITAL_COMMUNITY): Payer: Medicare PPO | Admitting: Internal Medicine

## 2020-09-18 DIAGNOSIS — I2721 Secondary pulmonary arterial hypertension: Secondary | ICD-10-CM | POA: Diagnosis not present

## 2020-09-18 DIAGNOSIS — I480 Paroxysmal atrial fibrillation: Secondary | ICD-10-CM

## 2020-09-18 DIAGNOSIS — J8410 Pulmonary fibrosis, unspecified: Secondary | ICD-10-CM

## 2020-09-18 DIAGNOSIS — J9621 Acute and chronic respiratory failure with hypoxia: Secondary | ICD-10-CM

## 2020-09-18 DIAGNOSIS — J449 Chronic obstructive pulmonary disease, unspecified: Secondary | ICD-10-CM

## 2020-09-18 NOTE — Progress Notes (Signed)
[place] NOTE  PULMONARY SERVICE ROUNDS  Date of Service: 09/18/2020  Betty [name]  DOB: [birth year]  Referring physician: Deanne Coffer, MD  HPI: Betty [name] is a 74 y.o. female  being seen for Acute on Chronic Respiratory Failure.  She is off the ventilator doing very well.  She has been on T collar and has excellent saturations noted currently is requiring 35% FiO2  Review of Systems: Unremarkable other than noted in HPI  Allergies:  Reviewed on the [facility]  Medications: Reviewed  Vitals: Temperature 98.4 pulse 106 respiratory rate 29 blood pressure is 120/54 saturations 98%  Ventilator Settings: Patient is on T-bar at this time good saturations  Physical Exam: . General:  calm and comfortable NAD . Eyes: normal lids, irises & conjunctiva . ENT: grossly normal tongue not enlarged . Neck: no masses . Cardiovascular: S1 S2 Normal no rubs no gallop . Respiratory: No rhonchi no rales are noted at this time . Abdomen: soft non-distended . Skin: no rash seen on limited exam . Musculoskeletal:  no rigidity . Psychiatric: unable to assess . Neurologic: no involuntary movements          Lab Data and radiological Data:  No labs to report today   Assessment/Plan  Patient Active Problem List   Diagnosis Date Noted  . Paroxysmal atrial fibrillation (HCC)   . Acute on chronic respiratory failure with hypoxia ([place])   . Pulmonary fibrosis ([place])   . Pulmonary arterial hypertension ([place])   . COPD (chronic obstructive pulmonary disease) ([place]) 02/26/2018      1. Acute on chronic respiratory failure with hypoxia we will continue with the wean on T collar.  Doing well completing 24 hours today. 2. Paroxysmal atrial fibrillation rate is controlled 3. Pulmonary fibrosis at baseline we will continue to follow 4. Pulmonary hypertension no change 5. COPD medical management severe disease   I have personally evaluated the patient, evaluated the  laboratory and imaging results and formulated the assessment and plan and placed orders as needed. The Patient requires high complexity decision making with multiple system involvement. Rounds were done with the Respiratory Therapy Director and respiratory therapist involved in the care of the patient as well as nursing staff.   Allyne Gee, MD [facility] Pulmonary Critical Care Medicine   This note is for inpatient care

## 2020-09-20 ENCOUNTER — Other Ambulatory Visit (HOSPITAL_COMMUNITY): Payer: Medicare PPO | Admitting: Internal Medicine

## 2020-09-20 DIAGNOSIS — J449 Chronic obstructive pulmonary disease, unspecified: Secondary | ICD-10-CM | POA: Diagnosis not present

## 2020-09-20 DIAGNOSIS — I480 Paroxysmal atrial fibrillation: Secondary | ICD-10-CM

## 2020-09-20 DIAGNOSIS — I2721 Secondary pulmonary arterial hypertension: Secondary | ICD-10-CM

## 2020-09-20 DIAGNOSIS — J9621 Acute and chronic respiratory failure with hypoxia: Secondary | ICD-10-CM | POA: Diagnosis not present

## 2020-09-20 DIAGNOSIS — J8410 Pulmonary fibrosis, unspecified: Secondary | ICD-10-CM

## 2020-09-20 NOTE — Progress Notes (Signed)
[place] NOTE  PULMONARY SERVICE ROUNDS  Date of Service: 09/20/2020  Betty [name]  DOB: 02-24-1946  Referring physician: Deanne Coffer, MD  HPI: Betty [name] is a 74 y.o. female  being seen for Acute on Chronic Respiratory Failure.  Patient is on T collar currently on 35% FiO2 with good saturations.  Should be able to change over to a #6 cuffless trach  Review of Systems: Unremarkable other than noted in HPI  Allergies:  Reviewed on the [facility]  Medications: Reviewed  Vitals: Temperature 97.3 pulse 82 respiratory 16 blood pressure is 112/66 saturations 100%  Ventilator Settings: Off the ventilator on T collar with an FiO2 of 35%  Physical Exam: . General:  calm and comfortable NAD . Eyes: normal lids, irises & conjunctiva . ENT: grossly normal tongue not enlarged . Neck: no masses . Cardiovascular: S1 S2 Normal no rubs no gallop . Respiratory: No rhonchi coarse breath sounds . Abdomen: soft non-distended . Skin: no rash seen on limited exam . Musculoskeletal:  no rigidity . Psychiatric: unable to assess . Neurologic: no involuntary movements          Lab Data and radiological Data:  No labs today   Assessment/Plan  Patient Active Problem List   Diagnosis Date Noted  . Paroxysmal atrial fibrillation (HCC)   . Acute on chronic respiratory failure with hypoxia ([place])   . Pulmonary fibrosis ([place])   . Pulmonary arterial hypertension ([place])   . COPD (chronic obstructive pulmonary disease) ([place]) 02/26/2018      1. Acute on chronic respiratory failure hypoxia we will proceed to trach change to a #6 cuffless trach. 2. Paroxysmal atrial fibrillation rate is controlled 3. Pulmonary fibrosis at baseline 4. Pulmonary hypertension supportive care 5. COPD at baseline we will continue to monitor   I have personally evaluated the patient, evaluated the laboratory and imaging results and formulated the assessment and plan and placed orders as  needed. The Patient requires high complexity decision making with multiple system involvement. Rounds were done with the Respiratory Therapy Director and respiratory therapist involved in the care of the patient as well as nursing staff.   Allyne Gee, MD [facility] Pulmonary Critical Care Medicine   This note is for inpatient care

## 2020-09-21 ENCOUNTER — Other Ambulatory Visit (HOSPITAL_COMMUNITY): Payer: Medicare PPO | Admitting: Internal Medicine

## 2020-09-21 DIAGNOSIS — I480 Paroxysmal atrial fibrillation: Secondary | ICD-10-CM

## 2020-09-21 DIAGNOSIS — J449 Chronic obstructive pulmonary disease, unspecified: Secondary | ICD-10-CM | POA: Diagnosis not present

## 2020-09-21 DIAGNOSIS — J9621 Acute and chronic respiratory failure with hypoxia: Secondary | ICD-10-CM

## 2020-09-21 DIAGNOSIS — I2721 Secondary pulmonary arterial hypertension: Secondary | ICD-10-CM

## 2020-09-21 DIAGNOSIS — J8410 Pulmonary fibrosis, unspecified: Secondary | ICD-10-CM

## 2020-09-21 NOTE — Progress Notes (Signed)
Betty [name] NOTE  PULMONARY SERVICE ROUNDS  Date of Service: 09/21/2020  Betty [name]  DOB: 1946-12-19  Referring physician: Deanne Coffer, MD  HPI: Betty [name] is a 74 y.o. female  being seen for Acute on Chronic Respiratory Failure.  She looks good she was conversing with the examiner.  Wanted to do when she can go home.  She is off the ventilator on T collar on using PMV also  Review of Systems: Unremarkable other than noted in HPI  Allergies:  Reviewed on the [facility]  Medications: Reviewed  Vitals: Temperature 97.0 pulse 82 respiratory 18 blood pressure is 116/66 saturations 97%  Ventilator Settings: On T collar FiO2 35% with PMV  Physical Exam: . General:  calm and comfortable NAD . Eyes: normal lids, irises & conjunctiva . ENT: grossly normal tongue not enlarged . Neck: no masses . Cardiovascular: S1 S2 Normal no rubs no gallop . Respiratory: Scattered rhonchi expansion is equal . Abdomen: soft non-distended . Skin: no rash seen on limited exam . Musculoskeletal:  no rigidity . Psychiatric: unable to assess . Neurologic: no involuntary movements          Lab Data and radiological Data:  Sodium 154 potassium 3.5 BUN 53 creatinine 0.8 White count 8.8 hemoglobin 7.3 hematocrit 25.3 platelet count 143   Assessment/Plan  Patient Active Problem List   Diagnosis Date Noted  . Paroxysmal atrial fibrillation (HCC)   . Acute on chronic respiratory failure with hypoxia ([place])   . Pulmonary fibrosis ([place])   . Pulmonary arterial hypertension ([place])   . COPD (chronic obstructive pulmonary disease) ([place]) 02/26/2018      1. Acute on chronic respiratory failure hypoxia we will continue with T collar and use PMV as ordered titrate oxygen continue pulmonary toilet. 2. Paroxysmal atrial fibrillation rate is controlled 3. Pulmonary fibrosis no change 4. Pulmonary arterial hypertension stable 5. COPD medical management   I have personally  evaluated the patient, evaluated the laboratory and imaging results and formulated the assessment and plan and placed orders as needed. The Patient requires high complexity decision making with multiple system involvement. Rounds were done with the Respiratory Therapy Director and respiratory therapist involved in the care of the patient as well as nursing staff.   Allyne Gee, MD [facility] Pulmonary Critical Care Medicine   This note is for inpatient care

## 2020-09-22 ENCOUNTER — Other Ambulatory Visit (HOSPITAL_COMMUNITY): Payer: Medicare PPO | Admitting: Internal Medicine

## 2020-09-22 DIAGNOSIS — I480 Paroxysmal atrial fibrillation: Secondary | ICD-10-CM | POA: Diagnosis not present

## 2020-09-22 DIAGNOSIS — J8410 Pulmonary fibrosis, unspecified: Secondary | ICD-10-CM

## 2020-09-22 DIAGNOSIS — J449 Chronic obstructive pulmonary disease, unspecified: Secondary | ICD-10-CM

## 2020-09-22 DIAGNOSIS — J9621 Acute and chronic respiratory failure with hypoxia: Secondary | ICD-10-CM | POA: Diagnosis not present

## 2020-09-22 DIAGNOSIS — I2721 Secondary pulmonary arterial hypertension: Secondary | ICD-10-CM

## 2020-09-22 NOTE — Progress Notes (Signed)
[place] NOTE  PULMONARY SERVICE ROUNDS  Date of Service: 09/22/2020  Betty [name]  DOB: Dec 01, 1946  Referring physician: Deanne Coffer, MD  HPI: Betty [name] is a 74 y.o. female  being seen for Acute on Chronic Respiratory Failure.  Patient is comfortable right now without distress has outn on T collar on 35% FiO2 patient's family was present in the room and they were updated.  Review of Systems: Unremarkable other than noted in HPI  Allergies:  Reviewed on the [facility]  Medications: Reviewed  Vitals: Temperature 96.9 pulse 78 respiratory 14 blood pressure is 115/62 saturations 95%  Ventilator Settings: On T collar with an FiO2 of 35%  Physical Exam: . General:  calm and comfortable NAD . Eyes: normal lids, irises & conjunctiva . ENT: grossly normal tongue not enlarged . Neck: no masses . Cardiovascular: S1 S2 Normal no rubs no gallop . Respiratory: No rhonchi very coarse breath sounds . Abdomen: soft non-distended . Skin: no rash seen on limited exam . Musculoskeletal:  no rigidity . Psychiatric: unable to assess . Neurologic: no involuntary movements          Lab Data and radiological Data:  Sodium 151 potassium 3.4 BUN 51 creatinine 0.7   Assessment/Plan  Patient Active Problem List   Diagnosis Date Noted  . Paroxysmal atrial fibrillation (HCC)   . Acute on chronic respiratory failure with hypoxia ([place])   . Pulmonary fibrosis ([place])   . Pulmonary arterial hypertension ([place])   . COPD (chronic obstructive pulmonary disease) ([place]) 02/26/2018      1. Acute on chronic respiratory failure with hypoxia we will continue with T collar trials currently on 35% FiO2. 2. Paroxysmal atrial fibrillation rate is controlled 3. Pulmonary fibrosis supportive care 4. Pulmonary hypertension at baseline 5. COPD medical management   I have personally evaluated the patient, evaluated the laboratory and imaging results and formulated the assessment  and plan and placed orders as needed. The Patient requires high complexity decision making with multiple system involvement. Rounds were done with the Respiratory Therapy Director and respiratory therapist involved in the care of the patient as well as nursing staff.   Allyne Gee, MD [facility] Pulmonary Critical Care Medicine   This note is for inpatient care

## 2020-09-25 ENCOUNTER — Other Ambulatory Visit (HOSPITAL_COMMUNITY): Payer: Medicare PPO | Admitting: Internal Medicine

## 2020-09-25 DIAGNOSIS — J8410 Pulmonary fibrosis, unspecified: Secondary | ICD-10-CM

## 2020-09-25 DIAGNOSIS — J9621 Acute and chronic respiratory failure with hypoxia: Secondary | ICD-10-CM | POA: Diagnosis not present

## 2020-09-25 DIAGNOSIS — I2721 Secondary pulmonary arterial hypertension: Secondary | ICD-10-CM

## 2020-09-25 DIAGNOSIS — J449 Chronic obstructive pulmonary disease, unspecified: Secondary | ICD-10-CM

## 2020-09-25 DIAGNOSIS — I480 Paroxysmal atrial fibrillation: Secondary | ICD-10-CM | POA: Diagnosis not present

## 2020-09-25 NOTE — Progress Notes (Signed)
[place] NOTE  PULMONARY SERVICE ROUNDS  Date of Service: 09/25/2020  Betty [name]  DOB: 21-Jun-1946  Referring physician: Deanne Coffer, MD  HPI: Betty [name] is a 74 y.o. female  being seen for Acute on Chronic Respiratory Failure.  Patient currently is off the ventilator on T collar has been on 35% FiO2 with good saturations.  Review of Systems: Unremarkable other than noted in HPI  Allergies:  Reviewed on the [facility]  Medications: Reviewed  Vitals: Temperature 96.2 pulse 78 respiratory 27 blood pressure is 104/62 saturations 95%  Ventilator Settings: Currently on T collar with an FiO2 of 35%  Physical Exam: . General:  calm and comfortable NAD . Eyes: normal lids, irises & conjunctiva . ENT: grossly normal tongue not enlarged . Neck: no masses . Cardiovascular: S1 S2 Normal no rubs no gallop . Respiratory: No rhonchi very coarse breath sounds . Abdomen: soft non-distended . Skin: no rash seen on limited exam . Musculoskeletal:  no rigidity . Psychiatric: unable to assess . Neurologic: no involuntary movements          Lab Data and radiological Data:  White count 7.5 hemoglobin 7.5 hematocrit 25.6 platelet count 115   Assessment/Plan  Patient Active Problem List   Diagnosis Date Noted  . Paroxysmal atrial fibrillation (HCC)   . Acute on chronic respiratory failure with hypoxia ([place])   . Pulmonary fibrosis ([place])   . Pulmonary arterial hypertension ([place])   . COPD (chronic obstructive pulmonary disease) ([place]) 02/26/2018      1. Acute on chronic respiratory failure hypoxia we will continue with T collar trials right now is on 35% FiO2 2. Paroxysmal atrial fibrillation rate is controlled 3. Pulmonary fibrosis supportive care 4. Pulmonary hypertension at baseline 5. COPD medical management   I have personally evaluated the patient, evaluated the laboratory and imaging results and formulated the assessment and plan and placed  orders as needed. The Patient requires high complexity decision making with multiple system involvement. Rounds were done with the Respiratory Therapy Director and respiratory therapist involved in the care of the patient as well as nursing staff.   Allyne Gee, MD [facility] Pulmonary Critical Care Medicine   This note is for inpatient care

## 2020-09-27 ENCOUNTER — Other Ambulatory Visit (HOSPITAL_COMMUNITY): Payer: Medicare PPO | Admitting: Internal Medicine

## 2020-09-27 DIAGNOSIS — I480 Paroxysmal atrial fibrillation: Secondary | ICD-10-CM | POA: Diagnosis not present

## 2020-09-27 DIAGNOSIS — J449 Chronic obstructive pulmonary disease, unspecified: Secondary | ICD-10-CM | POA: Diagnosis not present

## 2020-09-27 DIAGNOSIS — J9621 Acute and chronic respiratory failure with hypoxia: Secondary | ICD-10-CM

## 2020-09-27 DIAGNOSIS — I2721 Secondary pulmonary arterial hypertension: Secondary | ICD-10-CM | POA: Diagnosis not present

## 2020-09-27 DIAGNOSIS — J8410 Pulmonary fibrosis, unspecified: Secondary | ICD-10-CM

## 2020-09-27 NOTE — Progress Notes (Signed)
[place] NOTE  PULMONARY SERVICE ROUNDS  Date of Service: 09/27/2020  Betty [name]  DOB: 19-Jul-1946  Referring physician: Deanne Coffer, MD  HPI: Betty [name] is a 74 y.o. female  being seen for Acute on Chronic Respiratory Failure.  She is off the ventilator on T collar has been tolerating it fairly well so far  Review of Systems: Unremarkable other than noted in HPI  Allergies:  Reviewed on the [facility]  Medications: Reviewed  Vitals: Temperature is 96.5 pulse 80 respiratory 20 blood pressure is 137/70 saturations 98%  Ventilator Settings: Off the ventilator on T collar FiO2 35%  Physical Exam: . General:  calm and comfortable NAD . Eyes: normal lids, irises & conjunctiva . ENT: grossly normal tongue not enlarged . Neck: no masses . Cardiovascular: S1 S2 Normal no rubs no gallop . Respiratory: No rhonchi very coarse breath sounds . Abdomen: soft non-distended . Skin: no rash seen on limited exam . Musculoskeletal:  no rigidity . Psychiatric: unable to assess . Neurologic: no involuntary movements          Lab Data and radiological Data:  No labs today   Assessment/Plan  Patient Active Problem List   Diagnosis Date Noted  . Paroxysmal atrial fibrillation (HCC)   . Acute on chronic respiratory failure with hypoxia ([place])   . Pulmonary fibrosis ([place])   . Pulmonary arterial hypertension ([place])   . COPD (chronic obstructive pulmonary disease) ([place]) 02/26/2018      1. Acute on chronic respiratory failure hypoxia we will continues to do well with the T collar.  Spoke with respiratory therapy on rounds to attempt PMV capping. 2. Paroxysmal atrial fibrillation rate is controlled 3. Pulmonary hypertension at baseline 4. Pulmonary fibrosis no change 5. COPD stable continue with medical management   I have personally evaluated the patient, evaluated the laboratory and imaging results and formulated the assessment and plan and placed  orders as needed. The Patient requires high complexity decision making with multiple system involvement. Rounds were done with the Respiratory Therapy Director and respiratory therapist involved in the care of the patient as well as nursing staff.   Allyne Gee, MD [facility] Pulmonary Critical Care Medicine   This note is for inpatient care

## 2020-09-28 ENCOUNTER — Other Ambulatory Visit (HOSPITAL_COMMUNITY): Payer: Medicare PPO | Admitting: Internal Medicine

## 2020-09-28 DIAGNOSIS — I2721 Secondary pulmonary arterial hypertension: Secondary | ICD-10-CM

## 2020-09-28 DIAGNOSIS — I480 Paroxysmal atrial fibrillation: Secondary | ICD-10-CM

## 2020-09-28 DIAGNOSIS — J9621 Acute and chronic respiratory failure with hypoxia: Secondary | ICD-10-CM | POA: Diagnosis not present

## 2020-09-28 DIAGNOSIS — J8410 Pulmonary fibrosis, unspecified: Secondary | ICD-10-CM

## 2020-09-28 DIAGNOSIS — J449 Chronic obstructive pulmonary disease, unspecified: Secondary | ICD-10-CM

## 2020-09-28 NOTE — Progress Notes (Signed)
[place] NOTE  PULMONARY SERVICE ROUNDS  Date of Service: 09/28/2020  Betty [name]  DOB: Aug 18, 1946  Referring physician: Deanne Coffer, MD  HPI: Betty [name] is a 74 y.o. female  being seen for Acute on Chronic Respiratory Failure.  Patient at this time is T collar she was attempted at the [facility] but she did not tolerate  Review of Systems: Unremarkable other than noted in HPI  Allergies:  Reviewed on the [facility]  Medications: Reviewed  Vitals: Temperature 95.8 pulse 81 respiratory rate 22 blood pressure is 115/64 saturations are 97%  Ventilator Settings: On T collar FiO2 35%  Physical Exam: . General:  calm and comfortable NAD . Eyes: normal lids, irises & conjunctiva . ENT: grossly normal tongue not enlarged . Neck: no masses . Cardiovascular: S1 S2 Normal no rubs no gallop . Respiratory: No rhonchi very coarse breath sounds . Abdomen: soft non-distended . Skin: no rash seen on limited exam . Musculoskeletal:  no rigidity . Psychiatric: unable to assess . Neurologic: no involuntary movements          Lab Data and radiological Data:  No labs today   Assessment/Plan  Patient Active Problem List   Diagnosis Date Noted  . Paroxysmal atrial fibrillation (HCC)   . Acute on chronic respiratory failure with hypoxia ([place])   . Pulmonary fibrosis ([place])   . Pulmonary arterial hypertension ([place])   . COPD (chronic obstructive pulmonary disease) ([place]) 02/26/2018      1. Acute on chronic respiratory failure hypoxia plan is to continue with T collar respiratory therapy will reattempt the PMV. 2. Paroxysmal atrial fibrillation rate is controlled we will continue to follow 3. Pulmonary fibrosis supportive care 4. Pulmonary hypertension she will follow-up after discharge 5. Severe COPD medical management   I have personally evaluated the patient, evaluated the laboratory and imaging results and formulated the assessment and plan and placed  orders as needed. The Patient requires high complexity decision making with multiple system involvement. Rounds were done with the Respiratory Therapy Director and respiratory therapist involved in the care of the patient as well as nursing staff.   Allyne Gee, MD [facility] Pulmonary Critical Care Medicine   This note is for inpatient care

## 2020-09-29 ENCOUNTER — Other Ambulatory Visit (HOSPITAL_COMMUNITY): Payer: Medicare PPO | Admitting: Internal Medicine

## 2020-09-29 DIAGNOSIS — I480 Paroxysmal atrial fibrillation: Secondary | ICD-10-CM

## 2020-09-29 DIAGNOSIS — I2721 Secondary pulmonary arterial hypertension: Secondary | ICD-10-CM | POA: Diagnosis not present

## 2020-09-29 DIAGNOSIS — J9621 Acute and chronic respiratory failure with hypoxia: Secondary | ICD-10-CM | POA: Diagnosis not present

## 2020-09-29 DIAGNOSIS — J449 Chronic obstructive pulmonary disease, unspecified: Secondary | ICD-10-CM | POA: Diagnosis not present

## 2020-09-29 DIAGNOSIS — J8410 Pulmonary fibrosis, unspecified: Secondary | ICD-10-CM

## 2020-09-29 NOTE — Progress Notes (Signed)
Betty [name] NOTE  PULMONARY SERVICE ROUNDS  Date of Service: 09/29/2020  Betty [name]  DOB: 1946-02-02  Referring physician: Deanne Coffer, MD  HPI: Betty [name] is a 74 y.o. female  being seen for Acute on Chronic Respiratory Failure.  Patient at this time is on T collar has been on 35% FiO2 she is intermittently tolerating the Passy-Muir valve  Review of Systems: Unremarkable other than noted in HPI  Allergies:  Reviewed on the [facility]  Medications: Reviewed  Vitals: Temperature is 96.8 pulse 83 respiratory rate 20 blood pressure is 144/79 saturations 98%  Ventilator Settings: On T collar with an FiO2 of 35%  Physical Exam: . General:  calm and comfortable NAD . Eyes: normal lids, irises & conjunctiva . ENT: grossly normal tongue not enlarged . Neck: no masses . Cardiovascular: S1 S2 Normal no rubs no gallop . Respiratory: No rhonchi very coarse breath sounds . Abdomen: soft non-distended . Skin: no rash seen on limited exam . Musculoskeletal:  no rigidity . Psychiatric: unable to assess . Neurologic: no involuntary movements          Lab Data and radiological Data:  Sodium 146 potassium 3.5 BUN 56 creatinine 0.8 White count 7.7 hemoglobin 8.4 hematocrit 29 platelet count is 159   Assessment/Plan  Patient Active Problem List   Diagnosis Date Noted  . Paroxysmal atrial fibrillation (HCC)   . Acute on chronic respiratory failure with hypoxia ([place])   . Pulmonary fibrosis ([place])   . Pulmonary arterial hypertension ([place])   . COPD (chronic obstructive pulmonary disease) ([place]) 02/26/2018      1. Acute on chronic respiratory failure with hypoxia plan continue with T collar with an FiO2 35%. 2. Paroxysmal atrial fibrillation rate is controlled we will continue supportive care 3. Pulmonary fibrosis prognosis is guarded 4. Pulmonary hypertension we will follow up at discharge 5. COPD medical management   I have personally evaluated the  patient, evaluated the laboratory and imaging results and formulated the assessment and plan and placed orders as needed. The Patient requires high complexity decision making with multiple system involvement. Rounds were done with the Respiratory Therapy Director and respiratory therapist involved in the care of the patient as well as nursing staff.   Allyne Gee, MD [facility] Pulmonary Critical Care Medicine   This note is for inpatient care

## 2020-10-09 ENCOUNTER — Other Ambulatory Visit (HOSPITAL_COMMUNITY): Payer: Medicare PPO | Admitting: Internal Medicine

## 2020-10-09 DIAGNOSIS — J449 Chronic obstructive pulmonary disease, unspecified: Secondary | ICD-10-CM

## 2020-10-09 DIAGNOSIS — I480 Paroxysmal atrial fibrillation: Secondary | ICD-10-CM

## 2020-10-09 DIAGNOSIS — J9621 Acute and chronic respiratory failure with hypoxia: Secondary | ICD-10-CM | POA: Diagnosis not present

## 2020-10-09 DIAGNOSIS — I2721 Secondary pulmonary arterial hypertension: Secondary | ICD-10-CM

## 2020-10-09 DIAGNOSIS — J8410 Pulmonary fibrosis, unspecified: Secondary | ICD-10-CM

## 2020-10-09 NOTE — Progress Notes (Signed)
Betty [name] NOTE  PULMONARY SERVICE ROUNDS  Date of Service: 10/09/2020  Betty [name]  DOB: [birth year]  Referring physician: Deanne Coffer, MD  HPI: Betty [name] is a 74 y.o. female  being seen for Acute on Chronic Respiratory Failure.  Patient is off the ventilator has been on T collar currently using about 40% FiO2 with good saturations.  Review of Systems: Unremarkable other than noted in HPI  Allergies:  Reviewed on the [facility]  Medications: Reviewed  Vitals: Temperature is 97.2 pulse 88 respiratory rate is 19 blood pressure is 94/45 saturations 96%  Ventilator Settings: On T collar FiO2 40%  Physical Exam:  General:  calm and comfortable NAD  Eyes: normal lids, irises & conjunctiva  ENT: grossly normal tongue not enlarged  Neck: no masses  Cardiovascular: S1 S2 Normal no rubs no gallop  Respiratory: Scattered rhonchi expansion is equal  Abdomen: soft non-distended  Skin: no rash seen on limited exam  Musculoskeletal:  no rigidity  Psychiatric: unable to assess  Neurologic: no involuntary movements          Lab Data and radiological Data:  No labs to report today   Assessment/Plan  Patient Active Problem List   Diagnosis Date Noted   Paroxysmal atrial fibrillation (HCC)    Acute on chronic respiratory failure with hypoxia (HCC)    Pulmonary fibrosis (HCC)    Pulmonary arterial hypertension (HCC)    COPD (chronic obstructive pulmonary disease) ([place]) 02/26/2018      1. Acute on chronic respiratory failure with hypoxia plan is to continue with T collar try PMV as tolerated. 2. Paroxysmal atrial fibrillation rate is controlled we will continue to follow 3. Pulmonary fibrosis no change supportive care. 4. Pulmonary arterial hypertension will need follow-up after discharge 5. Severe COPD medical management   I have personally evaluated the patient, evaluated the laboratory and imaging results and formulated the  assessment and plan and placed orders as needed. The Patient requires high complexity decision making with multiple system involvement. Rounds were done with the Respiratory Therapy Director and respiratory therapist involved in the care of the patient as well as nursing staff.   Allyne Gee, MD [facility] Pulmonary Critical Care Medicine   This note is for inpatient care

## 2020-10-11 ENCOUNTER — Other Ambulatory Visit (HOSPITAL_COMMUNITY): Payer: Medicare PPO | Admitting: Internal Medicine

## 2020-10-11 DIAGNOSIS — I480 Paroxysmal atrial fibrillation: Secondary | ICD-10-CM

## 2020-10-11 DIAGNOSIS — J449 Chronic obstructive pulmonary disease, unspecified: Secondary | ICD-10-CM | POA: Diagnosis not present

## 2020-10-11 DIAGNOSIS — I2721 Secondary pulmonary arterial hypertension: Secondary | ICD-10-CM

## 2020-10-11 DIAGNOSIS — J9621 Acute and chronic respiratory failure with hypoxia: Secondary | ICD-10-CM

## 2020-10-11 DIAGNOSIS — J8410 Pulmonary fibrosis, unspecified: Secondary | ICD-10-CM

## 2020-10-11 NOTE — Progress Notes (Signed)
[place] NOTE  PULMONARY SERVICE ROUNDS  Date of Service: 10/11/2020  DARLENE BROZOWSKI  DOB: Jan 12, 1946  Referring physician: Deanne Coffer, MD  HPI: Betty [name] is a 74 y.o. female  being seen for Acute on Chronic Respiratory Failure.  Patient currently is on T collar appears to be comfortable the hemoglobin was down to 5.7 so therefore weaning is not tolerated this time  Review of Systems: Unremarkable other than noted in HPI  Allergies:  Reviewed on the [facility]  Medications: Reviewed  Vitals: Temperature 96.4 pulse 87 respiratory 24 blood pressure 101/56 saturations 95%  Ventilator Settings: On T collar currently  Physical Exam: . General:  calm and comfortable NAD . Eyes: normal lids, irises & conjunctiva . ENT: grossly normal tongue not enlarged . Neck: no masses . Cardiovascular: S1 S2 Normal no rubs no gallop . Respiratory: No rhonchi very coarse breath sounds . Abdomen: soft non-distended . Skin: no rash seen on limited exam . Musculoskeletal:  no rigidity . Psychiatric: unable to assess . Neurologic: no involuntary movements          Lab Data and radiological Data:  Sodium 139 potassium is 4.4 BUN 103 creatinine 2.4 White count 17 hemoglobin 5.7 hematocrit 19.6 platelet count 210   Assessment/Plan  Patient Active Problem List   Diagnosis Date Noted  . Paroxysmal atrial fibrillation (HCC)   . Acute on chronic respiratory failure with hypoxia ([place])   . Pulmonary fibrosis ([place])   . Pulmonary arterial hypertension ([place])   . COPD (chronic obstructive pulmonary disease) ([place]) 02/26/2018      1. Acute on chronic respiratory failure hypoxia patient currently is on T collar no weaning further beyond this point at this time patient is going to be transfused. 2. Pulmonary fibrosis no change we will continue with supportive care. 3. Pulmonary hypertension is at baseline we will continue with supportive care 4. Paroxysmal atrial  fibrillation rate is controlled we will continue to monitor. 5. COPD medical management   I have personally evaluated the patient, evaluated the laboratory and imaging results and formulated the assessment and plan and placed orders as needed. The Patient requires high complexity decision making with multiple system involvement. Rounds were done with the Respiratory Therapy Director and respiratory therapist involved in the care of the patient as well as nursing staff.   Allyne Gee, MD [facility] Pulmonary Critical Care Medicine   This note is for inpatient care

## 2020-10-13 ENCOUNTER — Other Ambulatory Visit (HOSPITAL_COMMUNITY): Payer: Medicare PPO | Admitting: Internal Medicine

## 2020-10-13 DIAGNOSIS — I2721 Secondary pulmonary arterial hypertension: Secondary | ICD-10-CM | POA: Diagnosis not present

## 2020-10-13 DIAGNOSIS — J449 Chronic obstructive pulmonary disease, unspecified: Secondary | ICD-10-CM | POA: Diagnosis not present

## 2020-10-13 DIAGNOSIS — K922 Gastrointestinal hemorrhage, unspecified: Secondary | ICD-10-CM

## 2020-10-13 DIAGNOSIS — J8410 Pulmonary fibrosis, unspecified: Secondary | ICD-10-CM

## 2020-10-13 DIAGNOSIS — J9621 Acute and chronic respiratory failure with hypoxia: Secondary | ICD-10-CM | POA: Diagnosis not present

## 2020-10-13 DIAGNOSIS — I480 Paroxysmal atrial fibrillation: Secondary | ICD-10-CM | POA: Diagnosis not present

## 2020-10-13 NOTE — Progress Notes (Signed)
[place] NOTE  PULMONARY SERVICE ROUNDS  Date of Service: 10/13/2020  TAKEILA THAYNE  DOB: 06/09/1946  Referring physician: Deanne Coffer, MD  HPI: DRINDA BELGARD is a 74 y.o. female  being seen for Acute on Chronic Respiratory Failure.  Doing poorly patient was on Levophed drip 10 mcg also known hemoglobin was down to 6.2  Review of Systems: Unremarkable other than noted in HPI  Allergies:  Reviewed on the [facility]  Medications: Reviewed  Vitals: Temperature is 98 pulse 90 respiratory rate was 30 saturations 98%  Ventilator Settings: On pressure assist control FiO2 35% tidal volume 511 PEEP 6 IP 20  Physical Exam: . General:  calm and comfortable NAD . Eyes: normal lids, irises & conjunctiva . ENT: grossly normal tongue not enlarged . Neck: no masses . Cardiovascular: S1 S2 Normal no rubs no gallop . Respiratory: No rhonchi no rales are noted at this time . Abdomen: soft non-distended . Skin: no rash seen on limited exam . Musculoskeletal:  no rigidity . Psychiatric: unable to assess . Neurologic: no involuntary movements          Lab Data and radiological Data:  Sodium 140 potassium 4.7 BUN is 130 creatinine 3.0 White count 26.5 hemoglobin 6.2 hematocrit 19.1 platelet count 231   Assessment/Plan  Patient Active Problem List   Diagnosis Date Noted  . Paroxysmal atrial fibrillation (HCC)   . Acute on chronic respiratory failure with hypoxia ([place])   . Pulmonary fibrosis ([place])   . Pulmonary arterial hypertension ([place])   . COPD (chronic obstructive pulmonary disease) ([place]) 02/26/2018      1. Acute on chronic respiratory failure hypoxia we will continue with full support on the ventilator.  Right now patient has multiple other medical issues going on including low hemoglobin GI bleed and is on pressors.  Patient remains critically ill 2. Paroxysmal atrial fibrillation rate is controlled at this time we will continue to  monitor 3. Pulmonary fibrosis we will continue with supportive care 4. Pulmonary hypertension no change 5. Severe COPD medical management we will continue to follow   I have personally evaluated the patient, evaluated the laboratory and imaging results and formulated the assessment and plan and placed orders as needed. The Patient requires high complexity decision making with multiple system involvement. Rounds were done with the Respiratory Therapy Director and respiratory therapist involved in the care of the patient as well as nursing staff.   Allyne Gee, MD [facility] Pulmonary Critical Care Medicine   This note is for inpatient care

## 2020-10-16 ENCOUNTER — Other Ambulatory Visit (HOSPITAL_COMMUNITY): Payer: Medicare PPO | Admitting: Internal Medicine

## 2020-10-16 DIAGNOSIS — I2721 Secondary pulmonary arterial hypertension: Secondary | ICD-10-CM

## 2020-10-16 DIAGNOSIS — I480 Paroxysmal atrial fibrillation: Secondary | ICD-10-CM

## 2020-10-16 DIAGNOSIS — J8410 Pulmonary fibrosis, unspecified: Secondary | ICD-10-CM

## 2020-10-16 DIAGNOSIS — J9621 Acute and chronic respiratory failure with hypoxia: Secondary | ICD-10-CM | POA: Diagnosis not present

## 2020-10-16 DIAGNOSIS — J449 Chronic obstructive pulmonary disease, unspecified: Secondary | ICD-10-CM

## 2020-10-16 NOTE — Progress Notes (Signed)
[place] NOTE  PULMONARY SERVICE ROUNDS  Date of Service: 10/16/2020  Betty [name]  DOB: 1946-05-29  Referring physician: Deanne Coffer, MD  HPI: Betty [name] is a 74 y.o. female  being seen for Acute on Chronic Respiratory Failure.  Patient is comfortable right now without distress remains on the ventilator and full support has been on pressure assist control  Review of Systems: Unremarkable other than noted in HPI  Allergies:  Reviewed on the [facility]  Medications: Reviewed  Vitals: Temperature is 96.8 pulse 80 respiratory rate is 22 blood pressure is 119/62 saturations 100%  Ventilator Settings: Currently is on pressure assist control FiO2 30% tidal volume is 445 PEEP 6 IP 20  Physical Exam: . General:  calm and comfortable NAD . Eyes: normal lids, irises & conjunctiva . ENT: grossly normal tongue not enlarged . Neck: no masses . Cardiovascular: S1 S2 Normal no rubs no gallop . Respiratory: No rhonchi very coarse breath sounds . Abdomen: soft non-distended . Skin: no rash seen on limited exam . Musculoskeletal:  no rigidity . Psychiatric: unable to assess . Neurologic: no involuntary movements          Lab Data and radiological Data:  No labs to report today   Assessment/Plan  Patient Active Problem List   Diagnosis Date Noted  . Paroxysmal atrial fibrillation (HCC)   . Acute on chronic respiratory failure with hypoxia ([place])   . Pulmonary fibrosis ([place])   . Pulmonary arterial hypertension ([place])   . COPD (chronic obstructive pulmonary disease) ([place]) 02/26/2018      1. Acute on chronic respiratory failure hypoxia patient remains on the ventilator and full support.  Mechanics are very poor she is not stable to be able to be weaned respiratory therapy will hold off for now. 2. Paroxysmal atrial fibrillation rate is controlled we will continue to follow along. 3. Pulmonary fibrosis prognosis guarded 4. Pulmonary hypertension no  change we will continue to follow 5. COPD medical management we will continue to follow along   I have personally evaluated the patient, evaluated the laboratory and imaging results and formulated the assessment and plan and placed orders as needed. The Patient requires high complexity decision making with multiple system involvement. Rounds were done with the Respiratory Therapy Director and respiratory therapist involved in the care of the patient as well as nursing staff.   Allyne Gee, MD [facility] Pulmonary Critical Care Medicine   This note is for inpatient care

## 2020-10-17 ENCOUNTER — Other Ambulatory Visit (HOSPITAL_COMMUNITY): Payer: Medicare PPO | Admitting: Internal Medicine

## 2020-10-17 DIAGNOSIS — J449 Chronic obstructive pulmonary disease, unspecified: Secondary | ICD-10-CM | POA: Diagnosis not present

## 2020-10-17 DIAGNOSIS — I2721 Secondary pulmonary arterial hypertension: Secondary | ICD-10-CM

## 2020-10-17 DIAGNOSIS — J8410 Pulmonary fibrosis, unspecified: Secondary | ICD-10-CM

## 2020-10-17 DIAGNOSIS — J9621 Acute and chronic respiratory failure with hypoxia: Secondary | ICD-10-CM | POA: Diagnosis not present

## 2020-10-17 DIAGNOSIS — I480 Paroxysmal atrial fibrillation: Secondary | ICD-10-CM | POA: Diagnosis not present

## 2020-10-17 NOTE — Progress Notes (Signed)
[place] NOTE  PULMONARY SERVICE ROUNDS  Date of Service: 10/17/2020  Betty [name]  DOB: Nov 07, 1946  Referring physician: Deanne Coffer, MD  HPI: Betty [name] is a 74 y.o. female  being seen for Acute on Chronic Respiratory Failure.  Patient currently is comfortable without distress remains on the ventilator.  Review of Systems: Unremarkable other than noted in HPI  Allergies:  Reviewed on the [facility]  Medications: Reviewed  Vitals: Temperature 97.0 pulse 90 respiratory rate was 25 blood pressure is 110/60 saturations 98%  Ventilator Settings: On pressure assist control FiO2 30% tidal volume 445 PEEP 6-20  Physical Exam: . General:  calm and comfortable NAD . Eyes: normal lids, irises & conjunctiva . ENT: grossly normal tongue not enlarged . Neck: no masses . Cardiovascular: S1 S2 Normal no rubs no gallop . Respiratory: Scattered rhonchi very coarse breath sounds  . Abdomen: soft non-distended . Skin: no rash seen on limited exam . Musculoskeletal:  no rigidity . Psychiatric: unable to assess . Neurologic: no involuntary movements          Lab Data and radiological Data:  *Complete Blood Count (CBC) Specimen:  Blood  Ref Range & Units 1 d ago  WBC (White Blood Cell Count) 3.2 - 9.8 x10^9/L 11.4High      Hemoglobin 12.0 - 15.5 g/dL 6.1Low      Hematocrit 35.0 - 45.0 % 20.2Low      Platelets 150 - 450 x10^9/L 172      MCV (Mean Corpuscular Volume) 80 - 98 fL 102High      MCH (Mean Corpuscular Hemoglobin) 26.5 - 34.0 pg 30.7      MCHC (Mean Corpuscular Hemoglobin Concentration) 31.4 - 36.0 % 30.2Low      RBC (Red Blood Cell Count) 3.77 - 5.16 x10^12/L 1.99Low      RDW-CV (Red Cell Distribution Width) 11.5 - 14.5 % 25.4High      NRBC (Nucleated Red Blood Cell Count) 0 x10^9/L 0.00      NRBC % (Nucleated Red Blood Cell %) % 0.0      MPV (Mean Platelet Volume) 7.2 - 11.7 fL 10.7      Resulting Agency  [facility]    Basic Metabolic Panel (BMP) Specimen:  Blood  Ref Range & Units 1 d ago Comments  Sodium 135 - 145 mmol/L 138        Potassium 3.5 - 5.0 mmol/L 3.9        Chloride 98 - 108 mmol/L 99        Carbon Dioxide (CO2) 21 - 30 mmol/L 26        Urea Nitrogen (BUN) 7 - 20 mg/dL 138High        Creatinine 0.4 - 1.0 mg/dL 2.8High        Glucose 70 - 140 mg/dL 125  Interpretive Data:  Above is the NONFASTING reference range.   Below are the FASTING reference ranges:  NORMAL:   70-99 mg/dL  PREDIABETES: 100-125 mg/dL  DIABETES:  > 125 mg/dL       Calcium 8.7 - 10.2 mg/dL 8.1Low        Anion Gap 3 - 12 mmol/L 13High        BUN/CREA Ratio 6 - 27  50High        Glomerular Filtration Rate (eGFR)  mL/min/1.73sq m 16       Assessment/Plan  Patient Active Problem List   Diagnosis Date Noted  . Paroxysmal  atrial fibrillation ([place])   . Acute on chronic respiratory failure with hypoxia ([place])   . Pulmonary fibrosis ([place])   . Pulmonary arterial hypertension ([place])   . COPD (chronic obstructive pulmonary disease) ([place]) 02/26/2018      1. Acute on chronic respiratory failure hypoxia patient continues with full support on the ventilator at this time.  We will continue secretion management pulmonary toilet.  Not able to do any weaning at this time. 2. Paroxysmal atrial fibrillation rate is controlled continue to follow. 3. Pulmonary fibrosis advanced disease we will continue present management 4. Pulmonary hypertension at baseline 5. COPD severe disease medical management   I have personally evaluated the patient, evaluated the laboratory and imaging results and formulated the assessment and plan and placed orders as needed. The Patient requires high complexity decision making with multiple system involvement. Rounds were done with the Respiratory Therapy Director and respiratory therapist involved in the care of the patient as well as nursing  staff.   Allyne Gee, MD [facility] Pulmonary Critical Care Medicine   This note is for inpatient care

## 2020-10-18 ENCOUNTER — Other Ambulatory Visit (HOSPITAL_COMMUNITY): Payer: Medicare PPO | Admitting: Internal Medicine

## 2020-10-18 DIAGNOSIS — J449 Chronic obstructive pulmonary disease, unspecified: Secondary | ICD-10-CM | POA: Diagnosis not present

## 2020-10-18 DIAGNOSIS — I2721 Secondary pulmonary arterial hypertension: Secondary | ICD-10-CM

## 2020-10-18 DIAGNOSIS — J9621 Acute and chronic respiratory failure with hypoxia: Secondary | ICD-10-CM

## 2020-10-18 DIAGNOSIS — I480 Paroxysmal atrial fibrillation: Secondary | ICD-10-CM

## 2020-10-18 DIAGNOSIS — J8410 Pulmonary fibrosis, unspecified: Secondary | ICD-10-CM

## 2020-10-18 NOTE — Progress Notes (Signed)
[place] NOTE  PULMONARY SERVICE ROUNDS  Date of Service: 10/18/2020  Betty [name]  DOB: 1946/06/22  Referring physician: Deanne Coffer, MD  HPI: Betty [name] is a 74 y.o. female  being seen for Acute on Chronic Respiratory Failure.  Patient is on full support on pressure control at this time.  Patient has also been requiring Levophed currently is on 6 mcg  Review of Systems: Unremarkable other than noted in HPI  Allergies:  Reviewed on the [facility]  Medications: Reviewed  Vitals: Temperature 97.3 pulse 70 respiratory 22 blood pressure is 135/60 saturations 98%  Ventilator Settings: On pressure assist control FiO2 30% tidal volume is 470 IP 20 PEEP 5  Physical Exam: . General:  calm and comfortable NAD . Eyes: normal lids, irises & conjunctiva . ENT: grossly normal tongue not enlarged . Neck: no masses . Cardiovascular: S1 S2 Normal no rubs no gallop . Respiratory: No rhonchi very coarse breath sounds . Abdomen: soft non-distended . Skin: no rash seen on limited exam . Musculoskeletal:  no rigidity . Psychiatric: unable to assess . Neurologic: no involuntary movements          Lab Data and radiological Data:  No labs today   Assessment/Plan  Patient Active Problem List   Diagnosis Date Noted  . Paroxysmal atrial fibrillation (HCC)   . Acute on chronic respiratory failure with hypoxia ([place])   . Pulmonary fibrosis ([place])   . Pulmonary arterial hypertension ([place])   . COPD (chronic obstructive pulmonary disease) ([place]) 02/26/2018      1. Acute on chronic respiratory failure with hypoxia patient is not able to do any weaning at this time.  Still requiring pressors with Levophed running at 6 mcg.  Continue with full support on pressure assist control an FiO2 of 30%. 2. Paroxysmal atrial fibrillation rate now rate controlled we will continue with supportive care. 3. Pulmonary fibrosis supportive care no improvement 4. Pulmonary  hypertension at baseline 5. COPD continue with medical management as necessary.   I have personally evaluated the patient, evaluated the laboratory and imaging results and formulated the assessment and plan and placed orders as needed. The Patient requires high complexity decision making with multiple system involvement. Rounds were done with the Respiratory Therapy Director and respiratory therapist involved in the care of the patient as well as nursing staff.   Allyne Gee, MD [facility] Pulmonary Critical Care Medicine   This note is for inpatient care

## 2020-10-20 ENCOUNTER — Other Ambulatory Visit (HOSPITAL_COMMUNITY): Payer: Medicare PPO | Admitting: Internal Medicine

## 2020-10-20 DIAGNOSIS — I2721 Secondary pulmonary arterial hypertension: Secondary | ICD-10-CM | POA: Diagnosis not present

## 2020-10-20 DIAGNOSIS — J8410 Pulmonary fibrosis, unspecified: Secondary | ICD-10-CM

## 2020-10-20 DIAGNOSIS — J9621 Acute and chronic respiratory failure with hypoxia: Secondary | ICD-10-CM | POA: Diagnosis not present

## 2020-10-20 DIAGNOSIS — J449 Chronic obstructive pulmonary disease, unspecified: Secondary | ICD-10-CM

## 2020-10-20 DIAGNOSIS — I480 Paroxysmal atrial fibrillation: Secondary | ICD-10-CM

## 2020-10-20 NOTE — Progress Notes (Signed)
[place] NOTE  PULMONARY SERVICE ROUNDS  Date of Service: 10/20/2020  TAYONNA BACHA  DOB: 1946/10/07  Referring physician: Deanne Coffer, MD  HPI: Betty [name] is a 74 y.o. female  being seen for Acute on Chronic Respiratory Failure.  Patient currently is on the ventilator at this time on full support and pressure control mode patient is also requiring Levophed as well as vasopressin for blood pressure issues.  Review of Systems: Unremarkable other than noted in HPI  Allergies:  Reviewed on the [facility]  Medications: Reviewed  Vitals: Temperature 98.6 pulse 71 respiratory 21 blood pressure is 112/64 saturations 94%  Ventilator Settings: On pressure assist control FiO2 is 30% IP 18 PEEP 5  Physical Exam: . General:  calm and comfortable NAD . Eyes: normal lids, irises & conjunctiva . ENT: grossly normal tongue not enlarged . Neck: no masses . Cardiovascular: S1 S2 Normal no rubs no gallop . Respiratory: Scattered coarse breath sounds noted bilaterally . Abdomen: soft non-distended . Skin: no rash seen on limited exam . Musculoskeletal:  no rigidity . Psychiatric: unable to assess . Neurologic: no involuntary movements          Lab Data and radiological Data:  Sodium 135 potassium 3.3 BUN is 134 creatinine 2.8 White count 9.7 hemoglobin 7.2 hematocrit 23.3 platelet count 193   Assessment/Plan  Patient Active Problem List   Diagnosis Date Noted  . Paroxysmal atrial fibrillation (HCC)   . Acute on chronic respiratory failure with hypoxia ([place])   . Pulmonary fibrosis ([place])   . Pulmonary arterial hypertension ([place])   . COPD (chronic obstructive pulmonary disease) ([place]) 02/26/2018      1. Acute on chronic respiratory failure hypoxia patient continues to require full support on the ventilator at this time on pressure assist control mode.  Because of vasopressors being required for blood pressure maintenance there is no weaning being done at  this time. 2. Paroxysmal atrial fibrillation rate is controlled continue to monitor. 3. Pulmonary fibrosis at baseline continue supportive care 4. Pulmonary arterial hypertension supportive care 5. COPD medical management nebulizers as necessary.   I have personally evaluated the patient, evaluated the laboratory and imaging results and formulated the assessment and plan and placed orders as needed. The Patient requires high complexity decision making with multiple system involvement. Rounds were done with the Respiratory Therapy Director and respiratory therapist involved in the care of the patient as well as nursing staff.   Allyne Gee, MD [facility] Pulmonary Critical Care Medicine   This note is for inpatient care

## 2020-10-23 ENCOUNTER — Other Ambulatory Visit (HOSPITAL_COMMUNITY): Payer: Medicare PPO | Admitting: Internal Medicine

## 2020-10-23 DIAGNOSIS — I480 Paroxysmal atrial fibrillation: Secondary | ICD-10-CM | POA: Diagnosis not present

## 2020-10-23 DIAGNOSIS — J8410 Pulmonary fibrosis, unspecified: Secondary | ICD-10-CM

## 2020-10-23 DIAGNOSIS — I2721 Secondary pulmonary arterial hypertension: Secondary | ICD-10-CM | POA: Diagnosis not present

## 2020-10-23 DIAGNOSIS — J9621 Acute and chronic respiratory failure with hypoxia: Secondary | ICD-10-CM

## 2020-10-23 DIAGNOSIS — J449 Chronic obstructive pulmonary disease, unspecified: Secondary | ICD-10-CM

## 2020-10-23 NOTE — Progress Notes (Signed)
[place] NOTE  PULMONARY SERVICE ROUNDS  Date of Service: 10/23/2020  Betty [name]  DOB: 11-Dec-1946  Referring physician: Deanne Coffer, MD  HPI: Betty [name] is a 74 y.o. female  being seen for Acute on Chronic Respiratory Failure.  Patient is on the ventilator for support currently is on pressure assist control mode has been on 30% FiO2 with an IP of 18  Review of Systems: Unremarkable other than noted in HPI  Allergies:  Reviewed on the [facility]  Medications: Reviewed  Vitals: Temperature is 98 pulse 73 respiratory 22 blood pressure is 101/58 saturations 100%  Ventilator Settings: On pressure assist control FiO2 30% tidal volume is 414 PEEP 5 IP 18  Physical Exam: . General:  calm and comfortable NAD . Eyes: normal lids, irises & conjunctiva . ENT: grossly normal tongue not enlarged . Neck: no masses . Cardiovascular: S1 S2 Normal no rubs no gallop . Respiratory: Scattered rhonchi expansion is equal . Abdomen: soft non-distended . Skin: no rash seen on limited exam . Musculoskeletal:  no rigidity . Psychiatric: unable to assess . Neurologic: no involuntary movements          Lab Data and radiological Data:  Sodium 141 potassium 3.3 BUN is 137 creatinine 2.9 White count 12 hemoglobin 7.8 hematocrit 25.3 platelet count 247   Assessment/Plan  Patient Active Problem List   Diagnosis Date Noted  . Paroxysmal atrial fibrillation (HCC)   . Acute on chronic respiratory failure with hypoxia ([place])   . Pulmonary fibrosis ([place])   . Pulmonary arterial hypertension ([place])   . COPD (chronic obstructive pulmonary disease) ([place]) 02/26/2018      1. Acute on chronic respiratory failure with hypoxia we will continue with full support on the ventilator.  She is not able to do any weaning.  We will continue secretion management pulmonary toilet 2. Paroxysmal atrial fibrillation rate is controlled 3. Pulmonary fibrosis no change continue with  supportive care 4. Pulmonary hypertension at baseline 5. COPD nebulizers as necessary   I have personally evaluated the patient, evaluated the laboratory and imaging results and formulated the assessment and plan and placed orders as needed. The Patient requires high complexity decision making with multiple system involvement. Rounds were done with the Respiratory Therapy Director and respiratory therapist involved in the care of the patient as well as nursing staff.   Allyne Gee, MD [facility] Pulmonary Critical Care Medicine   This note is for inpatient care

## 2020-10-24 ENCOUNTER — Other Ambulatory Visit (HOSPITAL_COMMUNITY): Payer: Medicare PPO | Admitting: Internal Medicine

## 2020-10-24 DIAGNOSIS — I2721 Secondary pulmonary arterial hypertension: Secondary | ICD-10-CM | POA: Diagnosis not present

## 2020-10-24 DIAGNOSIS — I480 Paroxysmal atrial fibrillation: Secondary | ICD-10-CM

## 2020-10-24 DIAGNOSIS — J9621 Acute and chronic respiratory failure with hypoxia: Secondary | ICD-10-CM | POA: Diagnosis not present

## 2020-10-24 DIAGNOSIS — J449 Chronic obstructive pulmonary disease, unspecified: Secondary | ICD-10-CM

## 2020-10-24 DIAGNOSIS — J8410 Pulmonary fibrosis, unspecified: Secondary | ICD-10-CM

## 2020-10-24 NOTE — Progress Notes (Signed)
[place] NOTE  PULMONARY SERVICE ROUNDS  Date of Service: 10/24/2020  Betty [name]  DOB: 1946-03-14  Referring physician: Deanne Coffer, MD  HPI: Betty [name] is a 74 y.o. female  being seen for Acute on Chronic Respiratory Failure.  Patient is currently on pressure control mode has been on 30% FiO2 with good saturations noted.  Review of Systems: Unremarkable other than noted in HPI  Allergies:  Reviewed on the [facility]  Medications: Reviewed  Vitals: Temperature is 97.1 pulse 71 respiratory rate is 20 blood pressure is 87/48 saturations 97%  Ventilator Settings: On pressure assist control FiO2 is 30% tidal volume 401 PEEP 5 IP 18  Physical Exam: . General:  calm and comfortable NAD . Eyes: normal lids, irises & conjunctiva . ENT: grossly normal tongue not enlarged . Neck: no masses . Cardiovascular: S1 S2 Normal no rubs no gallop . Respiratory: No rhonchi very coarse breath sounds . Abdomen: soft non-distended . Skin: no rash seen on limited exam . Musculoskeletal:  no rigidity . Psychiatric: unable to assess . Neurologic: no involuntary movements          Lab Data and radiological Data:  Sodium is 140 potassium is 3.6 BUN 137 creatinine 2.8 White count 7.3 hemoglobin 8 hematocrit 26.6 platelet count 182   Assessment/Plan  Patient Active Problem List   Diagnosis Date Noted  . Paroxysmal atrial fibrillation (HCC)   . Acute on chronic respiratory failure with hypoxia ([place])   . Pulmonary fibrosis ([place])   . Pulmonary arterial hypertension ([place])   . COPD (chronic obstructive pulmonary disease) ([place]) 02/26/2018      1. Acute on chronic respiratory failure with hypoxia we will continue with full support on the ventilator she is not a candidate for weaning right now 2. Paroxysmal atrial fibrillation rate is controlled 3. Pulmonary fibrosis at baseline 4. Pulmonary arterial hypertension no change 5. COPD severe disease continue with  supportive care   I have personally evaluated the patient, evaluated the laboratory and imaging results and formulated the assessment and plan and placed orders as needed. The Patient requires high complexity decision making with multiple system involvement. Rounds were done with the Respiratory Therapy Director and respiratory therapist involved in the care of the patient as well as nursing staff.   Allyne Gee, MD [facility] Pulmonary Critical Care Medicine   This note is for inpatient care

## 2020-10-26 ENCOUNTER — Other Ambulatory Visit (HOSPITAL_COMMUNITY): Payer: Medicare PPO | Admitting: Internal Medicine

## 2020-10-26 DIAGNOSIS — J8410 Pulmonary fibrosis, unspecified: Secondary | ICD-10-CM

## 2020-10-26 DIAGNOSIS — J449 Chronic obstructive pulmonary disease, unspecified: Secondary | ICD-10-CM

## 2020-10-26 DIAGNOSIS — I2721 Secondary pulmonary arterial hypertension: Secondary | ICD-10-CM | POA: Diagnosis not present

## 2020-10-26 DIAGNOSIS — J9621 Acute and chronic respiratory failure with hypoxia: Secondary | ICD-10-CM

## 2020-10-26 DIAGNOSIS — I480 Paroxysmal atrial fibrillation: Secondary | ICD-10-CM | POA: Diagnosis not present

## 2020-10-26 NOTE — Progress Notes (Signed)
[place] NOTE  PULMONARY SERVICE ROUNDS  Date of Service: 10/26/2020  Betty [name]  DOB: 22-Dec-1946  Referring physician: Deanne Coffer, MD  HPI: Betty [name] is a 74 y.o. female  being seen for Acute on Chronic Respiratory Failure.  She is grossly unchanged remains on pressure assist control has been on 30% FiO2 good saturations are noted  Review of Systems: Unremarkable other than noted in HPI  Allergies:  Reviewed on the [facility]  Medications: Reviewed  Vitals: Temperature is 96.3 pulse 67 respiratory rate 25 blood pressure is 103/45 saturations 99%  Ventilator Settings: On pressure assist control FiO2 is 30% tidal volume 430 PEEP 5  Physical Exam: . General:  calm and comfortable NAD . Eyes: normal lids, irises & conjunctiva . ENT: grossly normal tongue not enlarged . Neck: no masses . Cardiovascular: S1 S2 Normal no rubs no gallop . Respiratory: Scattered rhonchi very coarse breath sounds . Abdomen: soft non-distended . Skin: no rash seen on limited exam . Musculoskeletal:  no rigidity . Psychiatric: unable to assess . Neurologic: no involuntary movements          Lab Data and radiological Data:  Sodium 140 potassium 3.6 BUN 131 creatinine is 2.3 White count 7.2 hemoglobin 8.5 hematocrit 28.2 platelet count 207   Assessment/Plan  Patient Active Problem List   Diagnosis Date Noted  . Paroxysmal atrial fibrillation (HCC)   . Acute on chronic respiratory failure with hypoxia ([place])   . Pulmonary fibrosis ([place])   . Pulmonary arterial hypertension ([place])   . COPD (chronic obstructive pulmonary disease) ([place]) 02/26/2018      1. Acute on chronic respiratory failure with hypoxia we will continue with full support on the ventilator.  Patient right now is on 30% FiO2 good saturations are noted. 2. Paroxysmal atrial fibrillation rate is controlled at this time we will continue to follow 3. Pulmonary fibrosis no change 4. Pulmonary  hypertension at baseline 5. COPD medical management   I have personally evaluated the patient, evaluated the laboratory and imaging results and formulated the assessment and plan and placed orders as needed. The Patient requires high complexity decision making with multiple system involvement. Rounds were done with the Respiratory Therapy Director and respiratory therapist involved in the care of the patient as well as nursing staff.   Allyne Gee, MD [facility] Pulmonary Critical Care Medicine   This note is for inpatient care

## 2020-10-27 ENCOUNTER — Other Ambulatory Visit (HOSPITAL_COMMUNITY): Payer: Medicare PPO | Admitting: Internal Medicine

## 2020-10-27 DIAGNOSIS — J8410 Pulmonary fibrosis, unspecified: Secondary | ICD-10-CM

## 2020-10-27 DIAGNOSIS — J9621 Acute and chronic respiratory failure with hypoxia: Secondary | ICD-10-CM | POA: Diagnosis not present

## 2020-10-27 DIAGNOSIS — I2721 Secondary pulmonary arterial hypertension: Secondary | ICD-10-CM

## 2020-10-27 DIAGNOSIS — J449 Chronic obstructive pulmonary disease, unspecified: Secondary | ICD-10-CM

## 2020-10-27 DIAGNOSIS — I480 Paroxysmal atrial fibrillation: Secondary | ICD-10-CM

## 2020-10-27 NOTE — Progress Notes (Signed)
[place] NOTE  PULMONARY SERVICE ROUNDS  Date of Service: 10/27/2020  Betty [name]  DOB: 07-03-1946  Referring physician: Deanne Coffer, MD  HPI: Betty [name] is a 74 y.o. female  being seen for Acute on Chronic Respiratory Failure. Continues to do poorly she remains on full support and pressure control mode has been on 30% FiO2  Review of Systems: Unremarkable other than noted in HPI  Allergies:  Reviewed on the [facility]  Medications: Reviewed  Vitals: Temperature 96.7 pulse 74 respiratory 22 blood pressure is 109/56 saturations 98%  Ventilator Settings: On pressure assist control FiO2 is 30% tidal volume 473 IP 18 PEEP 5  Physical Exam: . General:  calm and comfortable NAD . Eyes: normal lids, irises & conjunctiva . ENT: grossly normal tongue not enlarged . Neck: no masses . Cardiovascular: S1 S2 Normal no rubs no gallop . Respiratory: Scattered rhonchi very coarse breath sounds . Abdomen: soft non-distended . Skin: no rash seen on limited exam . Musculoskeletal:  no rigidity . Psychiatric: unable to assess . Neurologic: no involuntary movements          Lab Data and radiological Data:  Sodium 143 potassium 3.4 BUN is 125 creatinine 1.9 White count 7.8 hemoglobin 8.2 hematocrit 28.3 platelet count 197   Assessment/Plan  Patient Active Problem List   Diagnosis Date Noted  . Paroxysmal atrial fibrillation (HCC)   . Acute on chronic respiratory failure with hypoxia ([place])   . Pulmonary fibrosis ([place])   . Pulmonary arterial hypertension ([place])   . COPD (chronic obstructive pulmonary disease) ([place]) 02/26/2018      1. Acute on chronic respiratory failure hypoxia continue with full support on the vent no weaning at this time 2. Paroxysmal atrial fibrillation rate is controlled we will monitor 3. Pulmonary fibrosis no change 4. Pulmonary hypertension at baseline 5. COPD medical management   I have personally evaluated the patient,  evaluated the laboratory and imaging results and formulated the assessment and plan and placed orders as needed. The Patient requires high complexity decision making with multiple system involvement. Rounds were done with the Respiratory Therapy Director and respiratory therapist involved in the care of the patient as well as nursing staff.   Allyne Gee, MD [facility] Pulmonary Critical Care Medicine   This note is for inpatient care

## 2020-10-29 ENCOUNTER — Other Ambulatory Visit (HOSPITAL_COMMUNITY): Payer: Medicare PPO | Admitting: Internal Medicine

## 2020-10-29 DIAGNOSIS — I2721 Secondary pulmonary arterial hypertension: Secondary | ICD-10-CM

## 2020-10-29 DIAGNOSIS — I480 Paroxysmal atrial fibrillation: Secondary | ICD-10-CM

## 2020-10-29 DIAGNOSIS — J9621 Acute and chronic respiratory failure with hypoxia: Secondary | ICD-10-CM | POA: Diagnosis not present

## 2020-10-29 DIAGNOSIS — J449 Chronic obstructive pulmonary disease, unspecified: Secondary | ICD-10-CM | POA: Diagnosis not present

## 2020-10-29 DIAGNOSIS — J8410 Pulmonary fibrosis, unspecified: Secondary | ICD-10-CM

## 2020-10-29 NOTE — Progress Notes (Signed)
[place] NOTE  PULMONARY SERVICE ROUNDS  Date of Service: 10/29/2020  Betty [name]  DOB: 12-23-1946  Referring physician: Deanne Coffer, MD  HPI: Betty [name] is a 74 y.o. female  being seen for Acute on Chronic Respiratory Failure.  Patient at this time is on full support and pressure control mode has been on 30% FiO2.  Good saturations are noted.  Review of Systems: Unremarkable other than noted in HPI  Allergies:  Reviewed on the [facility]  Medications: Reviewed  Vitals: Temperature 96.3 pulse 79 respiratory 19 blood pressure is 122/68 saturations 97%  Ventilator Settings: On pressure assist control FiO2 is 30% tidal volume is 534 IP 18 PEEP 5  Physical Exam: . General:  calm and comfortable NAD . Eyes: normal lids, irises & conjunctiva . ENT: grossly normal tongue not enlarged . Neck: no masses . Cardiovascular: S1 S2 Normal no rubs no gallop . Respiratory: Scattered rhonchi very coarse breath sounds . Abdomen: soft non-distended . Skin: no rash seen on limited exam . Musculoskeletal:  no rigidity . Psychiatric: unable to assess . Neurologic: no involuntary movements          Lab Data and radiological Data:  Sodium 145 potassium 3.2 BUN is 113 creatinine 1.7 White count 9.7 hemoglobin 8.3 hematocrit 27.9 platelet count 210   Assessment/Plan  Patient Active Problem List   Diagnosis Date Noted  . Paroxysmal atrial fibrillation (HCC)   . Acute on chronic respiratory failure with hypoxia ([place])   . Pulmonary fibrosis ([place])   . Pulmonary arterial hypertension ([place])   . COPD (chronic obstructive pulmonary disease) ([place]) 02/26/2018      1. Acute on chronic respiratory failure Paxil continue with full support patient's RSB I can examine extremely poor not able to tolerate weaning 2. Pulmonary fibrosis she is at baseline supportive care 3. Pulmonary hypertension no change 4. Severe COPD medical management supportive care 5. Paroxysmal  atrial fibrillation rate is controlled we will continue to follow   I have personally evaluated the patient, evaluated the laboratory and imaging results and formulated the assessment and plan and placed orders as needed. The Patient requires high complexity decision making with multiple system involvement. Rounds were done with the Respiratory Therapy Director and respiratory therapist involved in the care of the patient as well as nursing staff.   Allyne Gee, MD [facility] Pulmonary Critical Care Medicine   This note is for inpatient care

## 2020-10-30 ENCOUNTER — Other Ambulatory Visit (HOSPITAL_COMMUNITY): Payer: Medicare PPO | Admitting: Internal Medicine

## 2020-10-30 DIAGNOSIS — I2721 Secondary pulmonary arterial hypertension: Secondary | ICD-10-CM

## 2020-10-30 DIAGNOSIS — J9621 Acute and chronic respiratory failure with hypoxia: Secondary | ICD-10-CM | POA: Diagnosis not present

## 2020-10-30 DIAGNOSIS — I480 Paroxysmal atrial fibrillation: Secondary | ICD-10-CM

## 2020-10-30 DIAGNOSIS — J449 Chronic obstructive pulmonary disease, unspecified: Secondary | ICD-10-CM | POA: Diagnosis not present

## 2020-10-30 DIAGNOSIS — J8410 Pulmonary fibrosis, unspecified: Secondary | ICD-10-CM

## 2020-10-30 NOTE — Progress Notes (Signed)
[place] NOTE  PULMONARY SERVICE ROUNDS  Date of Service: 10/30/2020  Betty [name]  DOB: September 06, 1946  Referring physician: Deanne Coffer, MD  HPI: Betty [name] is a 74 y.o. female  being seen for Acute on Chronic Respiratory Failure.  Patient is on T collar this morning started without any distress right now is on 40% FiO2  Review of Systems: Unremarkable other than noted in HPI  Allergies:  Reviewed on the [facility]  Medications: Reviewed  Vitals: Temperature is 97.5 pulse 85 respiratory 19 blood pressure is 142/75 saturations 100%  Ventilator Settings: On T collar with an FiO2 of 40%  Physical Exam: . General:  calm and comfortable NAD . Eyes: normal lids, irises & conjunctiva . ENT: grossly normal tongue not enlarged . Neck: no masses . Cardiovascular: S1 S2 Normal no rubs no gallop . Respiratory: No rhonchi very coarse breath sounds . Abdomen: soft non-distended . Skin: no rash seen on limited exam . Musculoskeletal:  no rigidity . Psychiatric: unable to assess . Neurologic: no involuntary movements          Lab Data and radiological Data:  Sodium 146 potassium 3.6 BUN is 105 creatinine 1.5 White count 8.9 hemoglobin 8.5 hematocrit 28.8 platelet count 196   Assessment/Plan  Patient Active Problem List   Diagnosis Date Noted  . Paroxysmal atrial fibrillation (HCC)   . Acute on chronic respiratory failure with hypoxia ([place])   . Pulmonary fibrosis ([place])   . Pulmonary arterial hypertension ([place])   . COPD (chronic obstructive pulmonary disease) ([place]) 02/26/2018      1. Acute on chronic respiratory failure hypoxia patient will continue with the T collar as ordered. 2. Paroxysmal atrial fibrillation rate is controlled at this time 3. Pulmonary fibrosis no change 4. Pulmonary hypertension supportive care 5. COPD severe disease we will continue to monitor   I have personally evaluated the patient, evaluated the laboratory and  imaging results and formulated the assessment and plan and placed orders as needed. The Patient requires high complexity decision making with multiple system involvement. Rounds were done with the Respiratory Therapy Director and respiratory therapist involved in the care of the patient as well as nursing staff.   Allyne Gee, MD [facility] Pulmonary Critical Care Medicine   This note is for inpatient care

## 2020-10-31 ENCOUNTER — Other Ambulatory Visit (HOSPITAL_COMMUNITY): Payer: Medicare PPO | Admitting: Internal Medicine

## 2020-10-31 DIAGNOSIS — J9621 Acute and chronic respiratory failure with hypoxia: Secondary | ICD-10-CM

## 2020-10-31 DIAGNOSIS — J449 Chronic obstructive pulmonary disease, unspecified: Secondary | ICD-10-CM | POA: Diagnosis not present

## 2020-10-31 DIAGNOSIS — I2721 Secondary pulmonary arterial hypertension: Secondary | ICD-10-CM | POA: Diagnosis not present

## 2020-10-31 DIAGNOSIS — J8410 Pulmonary fibrosis, unspecified: Secondary | ICD-10-CM

## 2020-10-31 DIAGNOSIS — I480 Paroxysmal atrial fibrillation: Secondary | ICD-10-CM | POA: Diagnosis not present

## 2020-10-31 NOTE — Progress Notes (Signed)
[place] NOTE  PULMONARY SERVICE ROUNDS  Date of Service: 10/31/2020  Betty [name]  DOB: 26-Jun-1946  Referring physician: Deanne Coffer, MD  HPI: Betty [name] is a 74 y.o. female  being seen for Acute on Chronic Respiratory Failure.  Patient currently is on T collar has been on 35% FiO2 good saturations are noted.  Review of Systems: Unremarkable other than noted in HPI  Allergies:  Reviewed on the [facility]  Medications: Reviewed  Vitals: Temperature 97.0 pulse 84 respiratory rate 24 blood pressure is 129/68 saturations 98%  Ventilator Settings: Off the ventilator on T collar FiO2 35%  Physical Exam: . General:  calm and comfortable NAD . Eyes: normal lids, irises & conjunctiva . ENT: grossly normal tongue not enlarged . Neck: no masses . Cardiovascular: S1 S2 Normal no rubs no gallop . Respiratory: No rhonchi no rales are noted at this time . Abdomen: soft non-distended . Skin: no rash seen on limited exam . Musculoskeletal:  no rigidity . Psychiatric: unable to assess . Neurologic: no involuntary movements          Lab Data and radiological Data:  No labs to report today   Assessment/Plan  Patient Active Problem List   Diagnosis Date Noted  . Paroxysmal atrial fibrillation (HCC)   . Acute on chronic respiratory failure with hypoxia ([place])   . Pulmonary fibrosis ([place])   . Pulmonary arterial hypertension ([place])   . COPD (chronic obstructive pulmonary disease) ([place]) 02/26/2018      1. Acute on chronic respiratory failure hypoxia we will continue with T-piece titrate oxygen continue pulmonary toilet. 2. Paroxysmal atrial fibrillation rate is controlled 3. Pulmonary fibrosis supportive care 4. Pulmonary-tension at baseline 5. COPD medical management   I have personally evaluated the patient, evaluated the laboratory and imaging results and formulated the assessment and plan and placed orders as needed. The Patient requires high  complexity decision making with multiple system involvement. Rounds were done with the Respiratory Therapy Director and respiratory therapist involved in the care of the patient as well as nursing staff.   Allyne Gee, MD [facility] Pulmonary Critical Care Medicine   This note is for inpatient care

## 2020-11-01 ENCOUNTER — Other Ambulatory Visit (HOSPITAL_COMMUNITY): Payer: Medicare PPO | Admitting: Internal Medicine

## 2020-11-01 DIAGNOSIS — J9621 Acute and chronic respiratory failure with hypoxia: Secondary | ICD-10-CM | POA: Diagnosis not present

## 2020-11-01 DIAGNOSIS — J8410 Pulmonary fibrosis, unspecified: Secondary | ICD-10-CM

## 2020-11-01 DIAGNOSIS — J449 Chronic obstructive pulmonary disease, unspecified: Secondary | ICD-10-CM

## 2020-11-01 DIAGNOSIS — I480 Paroxysmal atrial fibrillation: Secondary | ICD-10-CM | POA: Diagnosis not present

## 2020-11-01 DIAGNOSIS — I2721 Secondary pulmonary arterial hypertension: Secondary | ICD-10-CM

## 2020-11-01 NOTE — Progress Notes (Signed)
Betty [name] NOTE  PULMONARY SERVICE ROUNDS  Date of Service: 11/01/2020  LATEISHA [name]  DOB: Mar 06, 1946  Referring physician: Deanne Coffer, MD  HPI: Betty [name] is a 74 y.o. female  being seen for Acute on Chronic Respiratory Failure.  She is on T collar has been completed 24 hours.  Right now is on 40% FiO2  Review of Systems: Unremarkable other than noted in HPI  Allergies:  Reviewed on the [facility]  Medications: Reviewed  Vitals: Temperature is 97.2 pulse 97 respiratory 22 blood pressure is 143/80 saturations 97%  Ventilator Settings: Currently on T collar has been on 40% FiO2  Physical Exam: . General:  calm and comfortable NAD . Eyes: normal lids, irises & conjunctiva . ENT: grossly normal tongue not enlarged . Neck: no masses . Cardiovascular: S1 S2 Normal no rubs no gallop . Respiratory: No rhonchi no rales noted at this time . Abdomen: soft non-distended . Skin: no rash seen on limited exam . Musculoskeletal:  no rigidity . Psychiatric: unable to assess . Neurologic: no involuntary movements          Lab Data and radiological Data:  ABG pH 7.41 PCO2 66 PO2 70   Assessment/Plan  Patient Active Problem List   Diagnosis Date Noted  . Paroxysmal atrial fibrillation (HCC)   . Acute on chronic respiratory failure with hypoxia ([place])   . Pulmonary fibrosis ([place])   . Pulmonary arterial hypertension ([place])   . COPD (chronic obstructive pulmonary disease) ([place]) 02/26/2018      1. Acute on chronic respiratory failure hypoxia we will continue with the T collar weaning patient right now is on 40% FiO2 titrate as tolerated continue with close management pulmonary toilet. 2. Paroxysmal atrial fibrillation rate now rate is controlled we will continue to follow. 3. Pulmonary fibrosis is at baseline 4. Pulmonary hypertension supportive care 5. COPD medical management nebulizers as necessary   I have personally evaluated the patient,  evaluated the laboratory and imaging results and formulated the assessment and plan and placed orders as needed. The Patient requires high complexity decision making with multiple system involvement. Rounds were done with the Respiratory Therapy Director and respiratory therapist involved in the care of the patient as well as nursing staff.   Allyne Gee, MD [facility] Pulmonary Critical Care Medicine   This note is for inpatient care

## 2020-11-02 ENCOUNTER — Other Ambulatory Visit (HOSPITAL_COMMUNITY): Payer: Medicare PPO | Admitting: Internal Medicine

## 2020-11-02 DIAGNOSIS — J9621 Acute and chronic respiratory failure with hypoxia: Secondary | ICD-10-CM

## 2020-11-02 DIAGNOSIS — J449 Chronic obstructive pulmonary disease, unspecified: Secondary | ICD-10-CM | POA: Diagnosis not present

## 2020-11-02 DIAGNOSIS — I480 Paroxysmal atrial fibrillation: Secondary | ICD-10-CM

## 2020-11-02 DIAGNOSIS — I2721 Secondary pulmonary arterial hypertension: Secondary | ICD-10-CM

## 2020-11-02 DIAGNOSIS — J8410 Pulmonary fibrosis, unspecified: Secondary | ICD-10-CM

## 2020-11-02 NOTE — Progress Notes (Signed)
[place] NOTE  PULMONARY SERVICE ROUNDS  Date of Service: 11/02/2020  Betty [name]  DOB: [birth year]  Referring physician: Deanne Coffer, MD  HPI: Betty [name] is a 74 y.o. female  being seen for Acute on Chronic Respiratory Failure.  She had to be placed back on the ventilator overnight now is on pressure control mode.  Review of Systems: Unremarkable other than noted in HPI  Allergies:  Reviewed on the [facility]  Medications: Reviewed  Vitals: Temperature is 97.9 pulse 99 respiratory 27 blood pressure is 109/59 saturations 100%  Ventilator Settings: On pressure assist control with an FiO2 of 35%  Physical Exam: . General:  calm and comfortable NAD . Eyes: normal lids, irises & conjunctiva . ENT: grossly normal tongue not enlarged . Neck: no masses . Cardiovascular: S1 S2 Normal no rubs no gallop . Respiratory: No rhonchi no rales are noted at this time . Abdomen: soft non-distended . Skin: no rash seen on limited exam . Musculoskeletal:  no rigidity . Psychiatric: unable to assess . Neurologic: no involuntary movements          Lab Data and radiological Data:  Sodium 150 potassium 3.0 BUN 83 creatinine is 1.3   Assessment/Plan  Patient Active Problem List   Diagnosis Date Noted  . Paroxysmal atrial fibrillation (HCC)   . Acute on chronic respiratory failure with hypoxia ([place])   . Pulmonary fibrosis ([place])   . Pulmonary arterial hypertension ([place])   . COPD (chronic obstructive pulmonary disease) ([place]) 02/26/2018      1. Acute on chronic respiratory failure hypoxia we will continue with pressure control patient will be held off on doing weaning right now. 2. Paroxysmal atrial fibrillation rate is controlled 3. Pulmonary fibrosis no change 4. Pulmonary hypertension patient is at baseline 5. COPD medical management   I have personally evaluated the patient, evaluated the laboratory and imaging results and formulated the assessment  and plan and placed orders as needed. The Patient requires high complexity decision making with multiple system involvement. Rounds were done with the Respiratory Therapy Director and respiratory therapist involved in the care of the patient as well as nursing staff.   Allyne Gee, MD [facility] Pulmonary Critical Care Medicine   This note is for inpatient care

## 2020-11-05 ENCOUNTER — Other Ambulatory Visit (HOSPITAL_COMMUNITY): Payer: Medicare PPO | Admitting: Internal Medicine

## 2020-11-05 DIAGNOSIS — J449 Chronic obstructive pulmonary disease, unspecified: Secondary | ICD-10-CM

## 2020-11-05 DIAGNOSIS — J9621 Acute and chronic respiratory failure with hypoxia: Secondary | ICD-10-CM

## 2020-11-05 DIAGNOSIS — I2721 Secondary pulmonary arterial hypertension: Secondary | ICD-10-CM

## 2020-11-05 DIAGNOSIS — I480 Paroxysmal atrial fibrillation: Secondary | ICD-10-CM | POA: Diagnosis not present

## 2020-11-05 DIAGNOSIS — J8410 Pulmonary fibrosis, unspecified: Secondary | ICD-10-CM

## 2020-11-05 NOTE — Progress Notes (Signed)
[place] NOTE  PULMONARY SERVICE ROUNDS  Date of Service: 11/05/2020  Betty [name]  DOB: 06-30-1946  Referring physician: Deanne Coffer, MD  HPI: Betty [name] is a 74 y.o. female  being seen for Acute on Chronic Respiratory Failure.  The patient is currently on full support on pressure control mode has been on 40% FiO2 apparently had an aspiration event and is back on the ventilator  Review of Systems: Unremarkable other than noted in HPI  Allergies:  Reviewed on the [facility]  Medications: Reviewed  Vitals: Temperature 96.9 pulse 84 respiratory rate 22 blood pressure is 112/61 saturations 98%  Ventilator Settings: On pressure assist control FiO2 is 40% tidal volume 350 IP 15 PEEP 6  Physical Exam: . General:  calm and comfortable NAD . Eyes: normal lids, irises & conjunctiva . ENT: grossly normal tongue not enlarged . Neck: no masses . Cardiovascular: S1 S2 Normal no rubs no gallop . Respiratory: No rhonchi very coarse breath sounds noted . Abdomen: soft non-distended . Skin: no rash seen on limited exam . Musculoskeletal:  no rigidity . Psychiatric: unable to assess . Neurologic: no involuntary movements          Lab Data and radiological Data:  No labs to report today   Assessment/Plan  Patient Active Problem List   Diagnosis Date Noted  . Paroxysmal atrial fibrillation (HCC)   . Acute on chronic respiratory failure with hypoxia ([place])   . Pulmonary fibrosis ([place])   . Pulmonary arterial hypertension ([place])   . COPD (chronic obstructive pulmonary disease) ([place]) 02/26/2018      1. Acute on chronic respiratory failure with hypoxia patient right now is doing well on the ventilator.  She is not doing well in any attempts at weaning. 2. Paroxysmal atrial fibrillation rate is controlled we will continue with supportive care 3. Pulmonary fibrosis no change 4. Aspiration pneumonia supportive care antibiotics 5. COPD at baseline   I  have personally evaluated the patient, evaluated the laboratory and imaging results and formulated the assessment and plan and placed orders as needed. The Patient requires high complexity decision making with multiple system involvement. Rounds were done with the Respiratory Therapy Director and respiratory therapist involved in the care of the patient as well as nursing staff.   Allyne Gee, MD [facility] Pulmonary Critical Care Medicine   This note is for inpatient care

## 2020-11-12 ENCOUNTER — Other Ambulatory Visit (HOSPITAL_COMMUNITY): Payer: Medicare PPO | Admitting: Internal Medicine

## 2020-11-12 DIAGNOSIS — J449 Chronic obstructive pulmonary disease, unspecified: Secondary | ICD-10-CM

## 2020-11-12 DIAGNOSIS — I480 Paroxysmal atrial fibrillation: Secondary | ICD-10-CM

## 2020-11-12 DIAGNOSIS — I2721 Secondary pulmonary arterial hypertension: Secondary | ICD-10-CM | POA: Diagnosis not present

## 2020-11-12 DIAGNOSIS — J9621 Acute and chronic respiratory failure with hypoxia: Secondary | ICD-10-CM

## 2020-11-12 DIAGNOSIS — J8410 Pulmonary fibrosis, unspecified: Secondary | ICD-10-CM

## 2020-11-12 NOTE — Progress Notes (Signed)
[place] NOTE  PULMONARY SERVICE ROUNDS  Date of Service: 11/12/2020  Betty [name]  DOB: [birth year]  Referring physician: Deanne Coffer, MD  HPI: Betty [name] is a 74 y.o. female  being seen for Acute on Chronic Respiratory Failure.  She remains on pressure control has been on 35% FiO2.  Not tolerating any weaning at this stage  Review of Systems: Unremarkable other than noted in HPI  Allergies:  Reviewed on the [facility]  Medications: Reviewed  Vitals: Temperature 98.5 pulse 83 respiratory rate 25 blood pressure is 113/60 saturations 97%  Ventilator Settings: Patient currently is on pressure control FiO2 35% tidal volume 464 PEEP 5  Physical Exam: . General:  calm and comfortable NAD . Eyes: normal lids, irises & conjunctiva . ENT: grossly normal tongue not enlarged . Neck: no masses . Cardiovascular: S1 S2 Normal no rubs no gallop . Respiratory: No rhonchi no rales . Abdomen: soft non-distended . Skin: no rash seen on limited exam . Musculoskeletal:  no rigidity . Psychiatric: unable to assess . Neurologic: no involuntary movements          Lab Data and radiological Data:  No labs to report today   Assessment/Plan  Patient Active Problem List   Diagnosis Date Noted  . Paroxysmal atrial fibrillation (HCC)   . Acute on chronic respiratory failure with hypoxia ([place])   . Pulmonary fibrosis ([place])   . Pulmonary arterial hypertension ([place])   . COPD (chronic obstructive pulmonary disease) ([place]) 02/26/2018      1. Acute on chronic respiratory failure with hypoxia we will continue full vent support on 35% FiO2 good saturations are noted she has not been tolerating any attempts at weaning. 2. Paroxysmal atrial fibrillation rate is controlled right now 3. Pulmonary fibrosis no change 4. Pulmonary hypertension at baseline continue to follow 5. COPD severe disease   I have personally evaluated the patient, evaluated the laboratory and  imaging results and formulated the assessment and plan and placed orders as needed. The Patient requires high complexity decision making with multiple system involvement. Rounds were done with the Respiratory Therapy Director and respiratory therapist involved in the care of the patient as well as nursing staff.   Allyne Gee, MD [facility] Pulmonary Critical Care Medicine   This note is for inpatient care

## 2020-11-13 ENCOUNTER — Other Ambulatory Visit (HOSPITAL_COMMUNITY): Payer: Medicare PPO | Admitting: Internal Medicine

## 2020-11-13 DIAGNOSIS — J449 Chronic obstructive pulmonary disease, unspecified: Secondary | ICD-10-CM | POA: Diagnosis not present

## 2020-11-13 DIAGNOSIS — I2721 Secondary pulmonary arterial hypertension: Secondary | ICD-10-CM

## 2020-11-13 DIAGNOSIS — I480 Paroxysmal atrial fibrillation: Secondary | ICD-10-CM | POA: Diagnosis not present

## 2020-11-13 DIAGNOSIS — J9621 Acute and chronic respiratory failure with hypoxia: Secondary | ICD-10-CM

## 2020-11-13 DIAGNOSIS — J8410 Pulmonary fibrosis, unspecified: Secondary | ICD-10-CM

## 2020-11-13 NOTE — Progress Notes (Signed)
[place] NOTE  PULMONARY SERVICE ROUNDS  Date of Service: 11/13/2020  Betty [name]  DOB: 22-Jan-1946  Referring physician: Deanne Coffer, MD  HPI: Betty [name] is a 74 y.o. female  being seen for Acute on Chronic Respiratory Failure.  She is on pressure control mode right now on 35% FiO2 she is at her baseline  Review of Systems: Unremarkable other than noted in HPI  Allergies:  Reviewed on the [facility]  Medications: Reviewed  Vitals: Temperature 98.1 pulse 83 respiratory 23 blood pressure is 103/52 saturations 95%  Ventilator Settings: On pressure assist control FiO2 is 35% IP 15 PEEP 5  Physical Exam: . General:  calm and comfortable NAD . Eyes: normal lids, irises & conjunctiva . ENT: grossly normal tongue not enlarged . Neck: no masses . Cardiovascular: S1 S2 Normal no rubs no gallop . Respiratory: No rhonchi very coarse breath sounds . Abdomen: soft non-distended . Skin: no rash seen on limited exam . Musculoskeletal:  no rigidity . Psychiatric: unable to assess . Neurologic: no involuntary movements          Lab Data and radiological Data:  No labs to report today   Assessment/Plan  Patient Active Problem List   Diagnosis Date Noted  . Paroxysmal atrial fibrillation (HCC)   . Acute on chronic respiratory failure with hypoxia ([place])   . Pulmonary fibrosis ([place])   . Pulmonary arterial hypertension ([place])   . COPD (chronic obstructive pulmonary disease) ([place]) 02/26/2018      1. Acute on chronic respiratory failure hypoxia plan continue with full support on pressure control she is not a candidate for weaning. 2. Paroxysmal atrial fibrillation rate is controlled 3. Pulmonary fibrosis advanced disease 4. Pulmonary hypertension patient is at baseline 5. COPD severe disease medical management   I have personally evaluated the patient, evaluated the laboratory and imaging results and formulated the assessment and plan and placed  orders as needed. The Patient requires high complexity decision making with multiple system involvement. Rounds were done with the Respiratory Therapy Director and respiratory therapist involved in the care of the patient as well as nursing staff.   Allyne Gee, MD [facility] Pulmonary Critical Care Medicine   This note is for inpatient care

## 2020-11-15 ENCOUNTER — Other Ambulatory Visit (HOSPITAL_COMMUNITY): Payer: Medicare PPO | Admitting: Internal Medicine

## 2020-11-15 DIAGNOSIS — J9621 Acute and chronic respiratory failure with hypoxia: Secondary | ICD-10-CM

## 2020-11-15 DIAGNOSIS — J449 Chronic obstructive pulmonary disease, unspecified: Secondary | ICD-10-CM

## 2020-11-15 DIAGNOSIS — I480 Paroxysmal atrial fibrillation: Secondary | ICD-10-CM | POA: Diagnosis not present

## 2020-11-15 DIAGNOSIS — J8410 Pulmonary fibrosis, unspecified: Secondary | ICD-10-CM

## 2020-11-15 DIAGNOSIS — I2721 Secondary pulmonary arterial hypertension: Secondary | ICD-10-CM | POA: Diagnosis not present

## 2020-11-16 ENCOUNTER — Other Ambulatory Visit: Payer: Self-pay | Admitting: Internal Medicine

## 2020-11-16 NOTE — Progress Notes (Signed)
[place] NOTE  PULMONARY SERVICE ROUNDS  Date of Service: 11/15/2020  RAKEISHA NYCE  DOB: 08/30/1946  Referring physician: Deanne Coffer, MD  HPI: Betty [name] is a 74 y.o. female  being seen for Acute on Chronic Respiratory Failure.  Patient is on the ventilator and full support not weaning at this time.  Review of Systems: Unremarkable other than noted in HPI  Allergies:  Reviewed on the [facility]  Medications: Reviewed  Vitals: Temperature 96.8 pulse 82 respiratory 20 blood pressure is 99/58 saturations 100%  Ventilator Settings: On pressure assist control FiO2 35% IP 15 PEEP 5 tidal line 375  Physical Exam: . General:  calm and comfortable NAD . Eyes: normal lids, irises & conjunctiva . ENT: grossly normal tongue not enlarged . Neck: no masses . Cardiovascular: S1 S2 Normal no rubs no gallop . Respiratory: No rhonchi coarse breath sounds are noted . Abdomen: soft non-distended . Skin: no rash seen on limited exam . Musculoskeletal:  no rigidity . Psychiatric: unable to assess . Neurologic: no involuntary movements          Lab Data and radiological Data:  Data reviewed   Assessment/Plan  Patient Active Problem List   Diagnosis Date Noted  . Paroxysmal atrial fibrillation (HCC)   . Acute on chronic respiratory failure with hypoxia ([place])   . Pulmonary fibrosis ([place])   . Pulmonary arterial hypertension ([place])   . COPD (chronic obstructive pulmonary disease) ([place]) 02/26/2018      1. Acute on chronic respiratory failure with hypoxia plan is to continue with full support on the ventilator she is not able to wean 2. Paroxysmal atrial fibrillation rate is controlled 3. Pulmonary fibrosis treated at baseline 4. Pulmonary hypertension no change 5. COPD at baseline medical management   I have personally evaluated the patient, evaluated the laboratory and imaging results and formulated the assessment and plan and placed orders as  needed. The Patient requires high complexity decision making with multiple system involvement. Rounds were done with the Respiratory Therapy Director and respiratory therapist involved in the care of the patient as well as nursing staff.   Allyne Gee, MD [facility] Pulmonary Critical Care Medicine   This note is for inpatient care

## 2020-11-17 ENCOUNTER — Other Ambulatory Visit (HOSPITAL_COMMUNITY): Payer: Medicare PPO | Admitting: Internal Medicine

## 2020-11-17 DIAGNOSIS — I480 Paroxysmal atrial fibrillation: Secondary | ICD-10-CM | POA: Diagnosis not present

## 2020-11-17 DIAGNOSIS — J9621 Acute and chronic respiratory failure with hypoxia: Secondary | ICD-10-CM | POA: Diagnosis not present

## 2020-11-17 DIAGNOSIS — I2721 Secondary pulmonary arterial hypertension: Secondary | ICD-10-CM | POA: Diagnosis not present

## 2020-11-17 DIAGNOSIS — J449 Chronic obstructive pulmonary disease, unspecified: Secondary | ICD-10-CM | POA: Diagnosis not present

## 2020-11-17 DIAGNOSIS — J8410 Pulmonary fibrosis, unspecified: Secondary | ICD-10-CM

## 2020-11-17 NOTE — Progress Notes (Signed)
[place] NOTE  PULMONARY SERVICE ROUNDS  Date of Service: 11/17/2020  ZHOEY BLACKSTOCK  DOB: 01-18-1946  Referring physician: Deanne Coffer, MD  HPI: JOLIE STROHECKER is a 74 y.o. female  being seen for Acute on Chronic Respiratory Failure.  No changes noted remains on the ventilator and full support and pressure control mode  Review of Systems: Unremarkable other than noted in HPI  Allergies:  Reviewed on the [facility]  Medications: Reviewed  Vitals: Temperature is 97.4 pulse 79 respiratory rate is 18 blood pressure is 114/64 saturations 96%  Ventilator Settings: Currently on pressure control FiO2 35% tidal volume 384 PEEP 5 IP 15  Physical Exam: . General:  calm and comfortable NAD . Eyes: normal lids, irises & conjunctiva . ENT: grossly normal tongue not enlarged . Neck: no masses . Cardiovascular: S1 S2 Normal no rubs no gallop . Respiratory: No rhonchi very coarse breath sounds . Abdomen: soft non-distended . Skin: no rash seen on limited exam . Musculoskeletal:  no rigidity . Psychiatric: unable to assess . Neurologic: no involuntary movements          Lab Data and radiological Data:  Sodium 145 potassium 3.6 BUN 41 creatinine 1.3   Assessment/Plan  Patient Active Problem List   Diagnosis Date Noted  . Paroxysmal atrial fibrillation (HCC)   . Acute on chronic respiratory failure with hypoxia ([place])   . Pulmonary fibrosis ([place])   . Pulmonary arterial hypertension ([place])   . COPD (chronic obstructive pulmonary disease) ([place]) 02/26/2018      1. Acute on chronic respiratory failure hypoxia we will continue with pressure control patient's mechanics have been very poor she is not able to wean 2. Paroxysmal atrial fibrillation rate is controlled 3. Pulmonary fibrosis no change 4. Pulmonary hypertension patient is at baseline 5. COPD severe disease we will continue to monitor   I have personally evaluated the patient, evaluated the  laboratory and imaging results and formulated the assessment and plan and placed orders as needed. The Patient requires high complexity decision making with multiple system involvement. Rounds were done with the Respiratory Therapy Director and respiratory therapist involved in the care of the patient as well as nursing staff.   Allyne Gee, MD [facility] Pulmonary Critical Care Medicine   This note is for inpatient care

## 2020-11-20 ENCOUNTER — Other Ambulatory Visit (HOSPITAL_COMMUNITY): Payer: Medicare PPO | Admitting: Internal Medicine

## 2020-11-20 DIAGNOSIS — I480 Paroxysmal atrial fibrillation: Secondary | ICD-10-CM | POA: Diagnosis not present

## 2020-11-20 DIAGNOSIS — J8410 Pulmonary fibrosis, unspecified: Secondary | ICD-10-CM

## 2020-11-20 DIAGNOSIS — I2721 Secondary pulmonary arterial hypertension: Secondary | ICD-10-CM | POA: Diagnosis not present

## 2020-11-20 DIAGNOSIS — J449 Chronic obstructive pulmonary disease, unspecified: Secondary | ICD-10-CM | POA: Diagnosis not present

## 2020-11-20 DIAGNOSIS — J9621 Acute and chronic respiratory failure with hypoxia: Secondary | ICD-10-CM | POA: Diagnosis not present

## 2020-11-20 NOTE — Progress Notes (Signed)
[place] NOTE  PULMONARY SERVICE ROUNDS  Date of Service: 11/20/2020  MARTHE DANT  DOB: [birth year]  Referring physician: Deanne Coffer, MD  HPI: KIT BRUBACHER is a 74 y.o. female  being seen for Acute on Chronic Respiratory Failure.  Patient is on full support and pressure control mode currently has been on 40% FiO2.  Good saturations are noted  Review of Systems: Unremarkable other than noted in HPI  Allergies:  Reviewed on the [facility]  Medications: Reviewed  Vitals: Temperature is 97.4 pulse 79 respiratory rate 26 blood pressure is 86/47 saturations 98%  Ventilator Settings: On pressure assist control FiO2 40% IP 15 PEEP 5  Physical Exam: . General:  calm and comfortable NAD . Eyes: normal lids, irises & conjunctiva . ENT: grossly normal tongue not enlarged . Neck: no masses . Cardiovascular: S1 S2 Normal no rubs no gallop . Respiratory: No rhonchi very coarse breath sounds are noted . Abdomen: soft non-distended . Skin: no rash seen on limited exam . Musculoskeletal:  no rigidity . Psychiatric: unable to assess . Neurologic: no involuntary movements          Lab Data and radiological Data:  Labs have been reviewed   Assessment/Plan  Patient Active Problem List   Diagnosis Date Noted  . Paroxysmal atrial fibrillation (HCC)   . Acute on chronic respiratory failure with hypoxia ([place])   . Pulmonary fibrosis ([place])   . Pulmonary arterial hypertension ([place])   . COPD (chronic obstructive pulmonary disease) ([place]) 02/26/2018      1. Acute on chronic respiratory failure with hypoxia we will continue with pressure control mode patient's mechanics are poor she is not able to tolerate weaning attempts. 2. Paroxysmal atrial fibrillation rate is controlled we will continue to follow 3. Pulmonary fibrosis no change 4. Pulmonary hypertension patient is at baseline 5. COPD severe disease continue present management   I have personally  evaluated the patient, evaluated the laboratory and imaging results and formulated the assessment and plan and placed orders as needed. The Patient requires high complexity decision making with multiple system involvement. Rounds were done with the Respiratory Therapy Director and respiratory therapist involved in the care of the patient as well as nursing staff.   Allyne Gee, MD [facility] Pulmonary Critical Care Medicine   This note is for inpatient care

## 2020-11-21 ENCOUNTER — Other Ambulatory Visit (HOSPITAL_COMMUNITY): Payer: Medicare PPO | Admitting: Internal Medicine

## 2020-11-21 DIAGNOSIS — J449 Chronic obstructive pulmonary disease, unspecified: Secondary | ICD-10-CM

## 2020-11-21 DIAGNOSIS — I2721 Secondary pulmonary arterial hypertension: Secondary | ICD-10-CM

## 2020-11-21 DIAGNOSIS — J9621 Acute and chronic respiratory failure with hypoxia: Secondary | ICD-10-CM | POA: Diagnosis not present

## 2020-11-21 DIAGNOSIS — I480 Paroxysmal atrial fibrillation: Secondary | ICD-10-CM

## 2020-11-21 DIAGNOSIS — J8410 Pulmonary fibrosis, unspecified: Secondary | ICD-10-CM

## 2020-11-21 NOTE — Progress Notes (Signed)
[place] NOTE  PULMONARY SERVICE ROUNDS  Date of Service: 11/21/2020  Betty [name]  DOB: 1946-01-08  Referring physician: Deanne Coffer, MD  HPI: Betty [name] is a 74 y.o. female  being seen for Acute on Chronic Respiratory Failure.  She is grossly unchanged remains on the ventilator not able to wean  Review of Systems: Unremarkable other than noted in HPI  Allergies:  Reviewed on the [facility]  Medications: Reviewed  Vitals: Temperature 96.6 pulse 87 respiratory rate is 28 blood pressure 102/66 saturations 99%  Ventilator Settings: On pressure assist control FiO2 40% IP 15 PEEP 5  Physical Exam: . General:  calm and comfortable NAD . Eyes: normal lids, irises & conjunctiva . ENT: grossly normal tongue not enlarged . Neck: no masses . Cardiovascular: S1 S2 Normal no rubs no gallop . Respiratory: No rhonchi coarse breath sounds . Abdomen: soft non-distended . Skin: no rash seen on limited exam . Musculoskeletal:  no rigidity . Psychiatric: unable to assess . Neurologic: no involuntary movements          Lab Data and radiological Data:  No labs to report today   Assessment/Plan  Patient Active Problem List   Diagnosis Date Noted  . Paroxysmal atrial fibrillation (HCC)   . Acute on chronic respiratory failure with hypoxia ([place])   . Pulmonary fibrosis ([place])   . Pulmonary arterial hypertension ([place])   . COPD (chronic obstructive pulmonary disease) ([place]) 02/26/2018      1. Acute on chronic respiratory failure hypoxia we will continue with the full support on the ventilator currently on 40% FiO2 she is not minimal.  Awaiting placement 2. Paroxysmal atrial fibrillation rate is controlled we will continue to monitor. 3. Pulmonary fibrosis at baseline 4. Pulmonary hypertension no change we will continue supportive care 5. COPD patient is at baseline   I have personally evaluated the patient, evaluated the laboratory and imaging results  and formulated the assessment and plan and placed orders as needed. The Patient requires high complexity decision making with multiple system involvement. Rounds were done with the Respiratory Therapy Director and respiratory therapist involved in the care of the patient as well as nursing staff.   Allyne Gee, MD [facility] Pulmonary Critical Care Medicine   This note is for inpatient care

## 2020-11-22 ENCOUNTER — Other Ambulatory Visit (HOSPITAL_COMMUNITY): Payer: Medicare PPO | Admitting: Internal Medicine

## 2020-11-22 DIAGNOSIS — J449 Chronic obstructive pulmonary disease, unspecified: Secondary | ICD-10-CM

## 2020-11-22 DIAGNOSIS — I480 Paroxysmal atrial fibrillation: Secondary | ICD-10-CM | POA: Diagnosis not present

## 2020-11-22 DIAGNOSIS — J9621 Acute and chronic respiratory failure with hypoxia: Secondary | ICD-10-CM

## 2020-11-22 DIAGNOSIS — I2721 Secondary pulmonary arterial hypertension: Secondary | ICD-10-CM | POA: Diagnosis not present

## 2020-11-22 DIAGNOSIS — J8410 Pulmonary fibrosis, unspecified: Secondary | ICD-10-CM

## 2020-11-22 NOTE — Progress Notes (Signed)
[place] NOTE  PULMONARY SERVICE ROUNDS  Date of Service: 11/22/2020  KIYA ENO  DOB: 1946/07/04  Referring physician: Deanne Coffer, MD  HPI: MERIAM CHOJNOWSKI is a 74 y.o. female  being seen for Acute on Chronic Respiratory Failure.  Possible discharge to hospice patient remains comfortable right now on the ventilator.  Review of Systems: Unremarkable other than noted in HPI  Allergies:  Reviewed on the [facility]  Medications: Reviewed  Vitals: Temperature is 96.9 pulse 88 respiratory rate 25 blood pressure is 116/58 saturations 100%  Ventilator Settings: On pressure assist control FiO2 40% IP 15 PEEP 5  Physical Exam: . General:  calm and comfortable NAD . Eyes: normal lids, irises & conjunctiva . ENT: grossly normal tongue not enlarged . Neck: no masses . Cardiovascular: S1 S2 Normal no rubs no gallop . Respiratory: No rhonchi coarse breath sounds . Abdomen: soft non-distended . Skin: no rash seen on limited exam . Musculoskeletal:  no rigidity . Psychiatric: unable to assess . Neurologic: no involuntary movements          Lab Data and radiological Data:  No labs to report today   Assessment/Plan  Patient Active Problem List   Diagnosis Date Noted  . Paroxysmal atrial fibrillation (HCC)   . Acute on chronic respiratory failure with hypoxia ([place])   . Pulmonary fibrosis ([place])   . Pulmonary arterial hypertension ([place])   . COPD (chronic obstructive pulmonary disease) ([place]) 02/26/2018      1. Acute on chronic respiratory failure hypoxia plan continue with pressure assist control continue with pulmonary toilet supportive care. 2. Paroxysmal atrial fibrillation rate is controlled at this time. 3. Pulmonary fibrosis treated slowly improving 4. Pulmonary hypertension patient is at baseline we will continue to monitor closely   I have personally evaluated the patient, evaluated the laboratory and imaging results and formulated the  assessment and plan and placed orders as needed. The Patient requires high complexity decision making with multiple system involvement. Rounds were done with the Respiratory Therapy Director and respiratory therapist involved in the care of the patient as well as nursing staff.   Allyne Gee, MD [facility] Pulmonary Critical Care Medicine   This note is for inpatient care

## 2020-12-23 DEATH — deceased
